# Patient Record
Sex: Male | Born: 1945 | Race: White | Hispanic: No | Smoking: Former smoker
Health system: Southern US, Community
[De-identification: ages and names within clinical notes are randomized; demographics above are authoritative.]

## PROBLEM LIST (undated history)

## (undated) DIAGNOSIS — I509 Heart failure, unspecified: Secondary | ICD-10-CM

## (undated) DIAGNOSIS — I2510 Atherosclerotic heart disease of native coronary artery without angina pectoris: Secondary | ICD-10-CM

## (undated) DIAGNOSIS — K922 Gastrointestinal hemorrhage, unspecified: Secondary | ICD-10-CM

## (undated) DIAGNOSIS — I219 Acute myocardial infarction, unspecified: Secondary | ICD-10-CM

## (undated) DIAGNOSIS — E7800 Pure hypercholesterolemia, unspecified: Secondary | ICD-10-CM

## (undated) DIAGNOSIS — I10 Essential (primary) hypertension: Secondary | ICD-10-CM

## (undated) DIAGNOSIS — Z720 Tobacco use: Secondary | ICD-10-CM

## (undated) DIAGNOSIS — G8929 Other chronic pain: Secondary | ICD-10-CM

## (undated) DIAGNOSIS — I350 Nonrheumatic aortic (valve) stenosis: Secondary | ICD-10-CM

## (undated) DIAGNOSIS — I255 Ischemic cardiomyopathy: Secondary | ICD-10-CM

## (undated) DIAGNOSIS — Z953 Presence of xenogenic heart valve: Secondary | ICD-10-CM

## (undated) DIAGNOSIS — R0989 Other specified symptoms and signs involving the circulatory and respiratory systems: Secondary | ICD-10-CM

## (undated) DIAGNOSIS — Z951 Presence of aortocoronary bypass graft: Secondary | ICD-10-CM

## (undated) DIAGNOSIS — J449 Chronic obstructive pulmonary disease, unspecified: Secondary | ICD-10-CM

## (undated) DIAGNOSIS — M545 Low back pain, unspecified: Secondary | ICD-10-CM

## (undated) DIAGNOSIS — E119 Type 2 diabetes mellitus without complications: Secondary | ICD-10-CM

## (undated) HISTORY — DX: Gastrointestinal hemorrhage, unspecified: K92.2

## (undated) HISTORY — PX: CORONARY ANGIOPLASTY WITH STENT PLACEMENT: SHX49

## (undated) HISTORY — DX: Heart failure, unspecified: I50.9

## (undated) HISTORY — PX: COLONOSCOPY: SHX174

## (undated) HISTORY — PX: TONSILLECTOMY: SUR1361

---

## 2016-01-25 DIAGNOSIS — M5416 Radiculopathy, lumbar region: Secondary | ICD-10-CM | POA: Diagnosis not present

## 2016-01-31 DIAGNOSIS — R002 Palpitations: Secondary | ICD-10-CM | POA: Diagnosis not present

## 2016-01-31 DIAGNOSIS — I25118 Atherosclerotic heart disease of native coronary artery with other forms of angina pectoris: Secondary | ICD-10-CM | POA: Diagnosis not present

## 2016-01-31 DIAGNOSIS — I10 Essential (primary) hypertension: Secondary | ICD-10-CM | POA: Diagnosis not present

## 2016-01-31 DIAGNOSIS — J449 Chronic obstructive pulmonary disease, unspecified: Secondary | ICD-10-CM | POA: Diagnosis not present

## 2016-01-31 DIAGNOSIS — E118 Type 2 diabetes mellitus with unspecified complications: Secondary | ICD-10-CM | POA: Diagnosis not present

## 2016-04-25 DIAGNOSIS — M545 Low back pain: Secondary | ICD-10-CM | POA: Diagnosis not present

## 2016-04-25 DIAGNOSIS — G8929 Other chronic pain: Secondary | ICD-10-CM | POA: Diagnosis not present

## 2016-05-07 DIAGNOSIS — R911 Solitary pulmonary nodule: Secondary | ICD-10-CM | POA: Diagnosis not present

## 2016-07-23 DIAGNOSIS — M4697 Unspecified inflammatory spondylopathy, lumbosacral region: Secondary | ICD-10-CM | POA: Diagnosis not present

## 2016-07-23 DIAGNOSIS — M47817 Spondylosis without myelopathy or radiculopathy, lumbosacral region: Secondary | ICD-10-CM | POA: Diagnosis not present

## 2016-07-30 DIAGNOSIS — M545 Low back pain: Secondary | ICD-10-CM | POA: Diagnosis not present

## 2016-07-30 DIAGNOSIS — K219 Gastro-esophageal reflux disease without esophagitis: Secondary | ICD-10-CM | POA: Diagnosis not present

## 2016-07-30 DIAGNOSIS — E118 Type 2 diabetes mellitus with unspecified complications: Secondary | ICD-10-CM | POA: Diagnosis not present

## 2016-07-30 DIAGNOSIS — Z9289 Personal history of other medical treatment: Secondary | ICD-10-CM | POA: Diagnosis not present

## 2016-07-30 DIAGNOSIS — J449 Chronic obstructive pulmonary disease, unspecified: Secondary | ICD-10-CM | POA: Diagnosis not present

## 2016-07-30 DIAGNOSIS — I25118 Atherosclerotic heart disease of native coronary artery with other forms of angina pectoris: Secondary | ICD-10-CM | POA: Diagnosis not present

## 2016-07-30 DIAGNOSIS — I10 Essential (primary) hypertension: Secondary | ICD-10-CM | POA: Diagnosis not present

## 2016-07-30 DIAGNOSIS — R002 Palpitations: Secondary | ICD-10-CM | POA: Diagnosis not present

## 2016-07-30 DIAGNOSIS — E1165 Type 2 diabetes mellitus with hyperglycemia: Secondary | ICD-10-CM | POA: Diagnosis not present

## 2016-09-18 DIAGNOSIS — M545 Low back pain: Secondary | ICD-10-CM | POA: Diagnosis not present

## 2016-11-20 DIAGNOSIS — M545 Low back pain: Secondary | ICD-10-CM | POA: Diagnosis not present

## 2016-11-20 DIAGNOSIS — G8929 Other chronic pain: Secondary | ICD-10-CM | POA: Diagnosis not present

## 2016-11-20 DIAGNOSIS — M542 Cervicalgia: Secondary | ICD-10-CM | POA: Diagnosis not present

## 2016-12-06 DIAGNOSIS — J449 Chronic obstructive pulmonary disease, unspecified: Secondary | ICD-10-CM | POA: Diagnosis not present

## 2016-12-06 DIAGNOSIS — K219 Gastro-esophageal reflux disease without esophagitis: Secondary | ICD-10-CM | POA: Diagnosis not present

## 2016-12-06 DIAGNOSIS — E118 Type 2 diabetes mellitus with unspecified complications: Secondary | ICD-10-CM | POA: Diagnosis not present

## 2016-12-06 DIAGNOSIS — I10 Essential (primary) hypertension: Secondary | ICD-10-CM | POA: Diagnosis not present

## 2016-12-06 DIAGNOSIS — I25118 Atherosclerotic heart disease of native coronary artery with other forms of angina pectoris: Secondary | ICD-10-CM | POA: Diagnosis not present

## 2016-12-06 DIAGNOSIS — Z9289 Personal history of other medical treatment: Secondary | ICD-10-CM | POA: Diagnosis not present

## 2016-12-06 DIAGNOSIS — M545 Low back pain: Secondary | ICD-10-CM | POA: Diagnosis not present

## 2016-12-06 DIAGNOSIS — E1165 Type 2 diabetes mellitus with hyperglycemia: Secondary | ICD-10-CM | POA: Diagnosis not present

## 2016-12-06 DIAGNOSIS — R002 Palpitations: Secondary | ICD-10-CM | POA: Diagnosis not present

## 2017-01-16 DIAGNOSIS — M542 Cervicalgia: Secondary | ICD-10-CM | POA: Diagnosis not present

## 2017-01-16 DIAGNOSIS — G8929 Other chronic pain: Secondary | ICD-10-CM | POA: Diagnosis not present

## 2017-01-16 DIAGNOSIS — M545 Low back pain: Secondary | ICD-10-CM | POA: Diagnosis not present

## 2017-04-09 DIAGNOSIS — M545 Low back pain: Secondary | ICD-10-CM | POA: Diagnosis not present

## 2017-04-09 DIAGNOSIS — E118 Type 2 diabetes mellitus with unspecified complications: Secondary | ICD-10-CM | POA: Diagnosis not present

## 2017-04-09 DIAGNOSIS — Z9289 Personal history of other medical treatment: Secondary | ICD-10-CM | POA: Diagnosis not present

## 2017-04-09 DIAGNOSIS — I25118 Atherosclerotic heart disease of native coronary artery with other forms of angina pectoris: Secondary | ICD-10-CM | POA: Diagnosis not present

## 2017-04-09 DIAGNOSIS — I10 Essential (primary) hypertension: Secondary | ICD-10-CM | POA: Diagnosis not present

## 2017-04-09 DIAGNOSIS — J449 Chronic obstructive pulmonary disease, unspecified: Secondary | ICD-10-CM | POA: Diagnosis not present

## 2017-04-09 DIAGNOSIS — K219 Gastro-esophageal reflux disease without esophagitis: Secondary | ICD-10-CM | POA: Diagnosis not present

## 2017-04-09 DIAGNOSIS — E1165 Type 2 diabetes mellitus with hyperglycemia: Secondary | ICD-10-CM | POA: Diagnosis not present

## 2017-04-09 DIAGNOSIS — R002 Palpitations: Secondary | ICD-10-CM | POA: Diagnosis not present

## 2017-04-19 DIAGNOSIS — G8929 Other chronic pain: Secondary | ICD-10-CM | POA: Diagnosis not present

## 2017-04-19 DIAGNOSIS — M545 Low back pain: Secondary | ICD-10-CM | POA: Diagnosis not present

## 2017-04-19 DIAGNOSIS — M542 Cervicalgia: Secondary | ICD-10-CM | POA: Diagnosis not present

## 2017-06-09 DIAGNOSIS — J441 Chronic obstructive pulmonary disease with (acute) exacerbation: Secondary | ICD-10-CM | POA: Diagnosis not present

## 2017-07-05 DIAGNOSIS — M5137 Other intervertebral disc degeneration, lumbosacral region: Secondary | ICD-10-CM | POA: Diagnosis not present

## 2017-08-12 DIAGNOSIS — M545 Low back pain: Secondary | ICD-10-CM | POA: Diagnosis not present

## 2017-08-12 DIAGNOSIS — Z9289 Personal history of other medical treatment: Secondary | ICD-10-CM | POA: Diagnosis not present

## 2017-08-12 DIAGNOSIS — I10 Essential (primary) hypertension: Secondary | ICD-10-CM | POA: Diagnosis not present

## 2017-08-12 DIAGNOSIS — E118 Type 2 diabetes mellitus with unspecified complications: Secondary | ICD-10-CM | POA: Diagnosis not present

## 2017-08-26 DIAGNOSIS — M5416 Radiculopathy, lumbar region: Secondary | ICD-10-CM | POA: Diagnosis not present

## 2017-10-21 DIAGNOSIS — G8929 Other chronic pain: Secondary | ICD-10-CM | POA: Diagnosis not present

## 2017-12-02 DIAGNOSIS — Z9289 Personal history of other medical treatment: Secondary | ICD-10-CM | POA: Diagnosis not present

## 2017-12-02 DIAGNOSIS — E118 Type 2 diabetes mellitus with unspecified complications: Secondary | ICD-10-CM | POA: Diagnosis not present

## 2017-12-02 DIAGNOSIS — I25118 Atherosclerotic heart disease of native coronary artery with other forms of angina pectoris: Secondary | ICD-10-CM | POA: Diagnosis not present

## 2017-12-02 DIAGNOSIS — M545 Low back pain: Secondary | ICD-10-CM | POA: Diagnosis not present

## 2017-12-02 DIAGNOSIS — Z23 Encounter for immunization: Secondary | ICD-10-CM | POA: Diagnosis not present

## 2017-12-02 DIAGNOSIS — R002 Palpitations: Secondary | ICD-10-CM | POA: Diagnosis not present

## 2017-12-02 DIAGNOSIS — E1165 Type 2 diabetes mellitus with hyperglycemia: Secondary | ICD-10-CM | POA: Diagnosis not present

## 2017-12-02 DIAGNOSIS — K219 Gastro-esophageal reflux disease without esophagitis: Secondary | ICD-10-CM | POA: Diagnosis not present

## 2017-12-02 DIAGNOSIS — J449 Chronic obstructive pulmonary disease, unspecified: Secondary | ICD-10-CM | POA: Diagnosis not present

## 2017-12-02 DIAGNOSIS — I10 Essential (primary) hypertension: Secondary | ICD-10-CM | POA: Diagnosis not present

## 2017-12-30 DIAGNOSIS — G8929 Other chronic pain: Secondary | ICD-10-CM | POA: Diagnosis not present

## 2018-02-01 DIAGNOSIS — R062 Wheezing: Secondary | ICD-10-CM | POA: Diagnosis not present

## 2018-02-01 DIAGNOSIS — J441 Chronic obstructive pulmonary disease with (acute) exacerbation: Secondary | ICD-10-CM | POA: Diagnosis not present

## 2018-02-03 ENCOUNTER — Emergency Department
Admission: EM | Admit: 2018-02-03 | Discharge: 2018-02-03 | Disposition: A | Discharge status: Home / Self Care | Payer: Medicare Other | Attending: Emergency Medicine | Admitting: Emergency Medicine

## 2018-02-03 ENCOUNTER — Encounter: Payer: Self-pay | Admitting: Emergency Medicine

## 2018-02-03 ENCOUNTER — Emergency Department: Payer: Medicare Other

## 2018-02-03 ENCOUNTER — Other Ambulatory Visit: Payer: Self-pay

## 2018-02-03 DIAGNOSIS — J441 Chronic obstructive pulmonary disease with (acute) exacerbation: Secondary | ICD-10-CM | POA: Insufficient documentation

## 2018-02-03 DIAGNOSIS — J101 Influenza due to other identified influenza virus with other respiratory manifestations: Secondary | ICD-10-CM | POA: Insufficient documentation

## 2018-02-03 DIAGNOSIS — R0602 Shortness of breath: Secondary | ICD-10-CM | POA: Diagnosis not present

## 2018-02-03 DIAGNOSIS — I2510 Atherosclerotic heart disease of native coronary artery without angina pectoris: Secondary | ICD-10-CM | POA: Insufficient documentation

## 2018-02-03 DIAGNOSIS — F17200 Nicotine dependence, unspecified, uncomplicated: Secondary | ICD-10-CM | POA: Insufficient documentation

## 2018-02-03 LAB — INFLUENZA PANEL BY PCR (TYPE A & B)
Influenza A By PCR: POSITIVE — AB
Influenza B By PCR: NEGATIVE

## 2018-02-03 LAB — COMPREHENSIVE METABOLIC PANEL
ALT: 25 U/L (ref 17–63)
AST: 54 U/L — ABNORMAL HIGH (ref 15–41)
Albumin: 3.9 g/dL (ref 3.5–5.0)
Alkaline Phosphatase: 45 U/L (ref 38–126)
Anion gap: 10 (ref 5–15)
BUN: 22 mg/dL — ABNORMAL HIGH (ref 6–20)
CO2: 22 mmol/L (ref 22–32)
Calcium: 8.7 mg/dL — ABNORMAL LOW (ref 8.9–10.3)
Chloride: 105 mmol/L (ref 101–111)
Creatinine, Ser: 0.84 mg/dL (ref 0.61–1.24)
GFR calc Af Amer: >60 mL/min (ref >60)
GFR calc non Af Amer: >60 mL/min (ref >60)
Glucose, Bld: 167 mg/dL — ABNORMAL HIGH (ref 65–99)
Potassium: 4.3 mmol/L (ref 3.5–5.1)
Sodium: 137 mmol/L (ref 135–145)
Total Bilirubin: 0.9 mg/dL (ref 0.3–1.2)
Total Protein: 7.1 g/dL (ref 6.5–8.1)

## 2018-02-03 LAB — CBC WITH DIFFERENTIAL/PLATELET
Basophils Absolute: 0 10*3/uL (ref 0–0.1)
Basophils Relative: 0 %
Eosinophils Absolute: 0 10*3/uL (ref 0–0.7)
Eosinophils Relative: 0 %
HCT: 44.4 % (ref 40.0–52.0)
Hemoglobin: 15.2 g/dL (ref 13.0–18.0)
Lymphocytes Relative: 9 %
Lymphs Abs: 0.7 10*3/uL — ABNORMAL LOW (ref 1.0–3.6)
MCH: 30.8 pg (ref 26.0–34.0)
MCHC: 34.2 g/dL (ref 32.0–36.0)
MCV: 90.1 fL (ref 80.0–100.0)
Monocytes Absolute: 0.5 10*3/uL (ref 0.2–1.0)
Monocytes Relative: 6 %
Neutro Abs: 6.7 10*3/uL — ABNORMAL HIGH (ref 1.4–6.5)
Neutrophils Relative %: 85 %
Platelets: 200 10*3/uL (ref 150–440)
RBC: 4.93 MIL/uL (ref 4.40–5.90)
RDW: 14.3 % (ref 11.5–14.5)
WBC: 7.9 10*3/uL (ref 3.8–10.6)

## 2018-02-03 MED ORDER — IPRATROPIUM-ALBUTEROL 0.5-2.5 (3) MG/3ML IN SOLN
3.0000 mL | Freq: Once | RESPIRATORY_TRACT | Status: AC
  Administered 2018-02-03: 3 mL via RESPIRATORY_TRACT

## 2018-02-03 MED ORDER — ALBUTEROL SULFATE (2.5 MG/3ML) 0.083% IN NEBU
5.0000 mg | INHALATION_SOLUTION | Freq: Once | RESPIRATORY_TRACT | Status: AC
  Administered 2018-02-03: 5 mg via RESPIRATORY_TRACT
  Filled 2018-02-03: qty 6

## 2018-02-03 MED ORDER — HYDROCOD POLST-CPM POLST ER 10-8 MG/5ML PO SUER: 5.0000 mL | Freq: Two times a day (BID) | ORAL | 0 refills | Status: DC

## 2018-02-03 MED ORDER — METHYLPREDNISOLONE SODIUM SUCC 125 MG IJ SOLR
125.0000 mg | Freq: Once | INTRAMUSCULAR | Status: AC
  Administered 2018-02-03: 125 mg via INTRAMUSCULAR
  Filled 2018-02-03: qty 2

## 2018-02-03 MED ORDER — IPRATROPIUM-ALBUTEROL 0.5-2.5 (3) MG/3ML IN SOLN: 3.0000 mL | Freq: Four times a day (QID) | RESPIRATORY_TRACT | 1 refills | Status: DC | PRN

## 2018-02-03 MED ORDER — OSELTAMIVIR PHOSPHATE 75 MG PO CAPS
75.0000 mg | ORAL_CAPSULE | Freq: Once | ORAL | Status: AC
  Administered 2018-02-03: 75 mg via ORAL
  Filled 2018-02-03: qty 1

## 2018-02-03 MED ORDER — OSELTAMIVIR PHOSPHATE 75 MG PO CAPS: 75.0000 mg | ORAL_CAPSULE | Freq: Two times a day (BID) | ORAL | 0 refills | Status: DC

## 2018-02-03 MED ORDER — IPRATROPIUM-ALBUTEROL 0.5-2.5 (3) MG/3ML IN SOLN
3.0000 mL | Freq: Once | RESPIRATORY_TRACT | Status: AC
  Administered 2018-02-03: 3 mL via RESPIRATORY_TRACT
  Filled 2018-02-03: qty 9

## 2018-02-03 MED ORDER — OSELTAMIVIR PHOSPHATE 75 MG PO CAPS: 75.0000 mg | ORAL_CAPSULE | Freq: Every morning | ORAL | 0 refills | Status: DC

## 2018-02-03 NOTE — ED Provider Notes (Signed)
[facility] Emergency Department Provider Note       Time seen: ----------------------------------------- 11:54 AM on 02/03/2018 -----------------------------------------   I have reviewed the triage vital signs and the nursing notes.  HISTORY   Chief Complaint Shortness of Breath and Chest Pain    HPI Harold Parker is a 72 y.o. male with a history of COPD, coronary artery disease who presents to the ED for [place] of breath and chest pain.  Patient was recently seen in urgent care and given prednisone as well as antibiotics without any significant improvement in his symptoms.  Patient feels like he is having a COPD exacerbation.  Patient reports poor sleeping over the past 2 days because of shortness of breath.  He does report he still smokes daily. Pain is 4 out of 10 in his chest  History reviewed. No pertinent past medical history.  There are no active problems to display for this patient.   History reviewed. No pertinent surgical history.  Allergies Patient has no known allergies.  Social History Social History   Tobacco Use  . Smoking status: Current Every Day Smoker  . Smokeless tobacco: Never Used  Substance Use Topics  . Alcohol use: No    Frequency: Never  . Drug use: No    Review of Systems Constitutional: Negative for fever. Cardiovascular: Positive for chest pain Respiratory: Positive for shortness of breath and cough Gastrointestinal: Negative for abdominal pain, vomiting and diarrhea. Genitourinary: Negative for dysuria. Musculoskeletal: Negative for back pain. Skin: Negative for rash. Neurological: Negative for headaches, focal weakness or numbness.  All systems negative/normal/unremarkable except as stated in the HPI  ____________________________________________   PHYSICAL EXAM:  VITAL SIGNS: ED Triage Vitals [02/03/18 0952]  Enc Vitals Group     BP 135/68     Pulse Rate 83     Resp 20     Temp 98.2 F (36.8 C)    Temp Source Oral     SpO2 92 %     Weight 172 lb (78 kg)     Height      Head Circumference      Peak Flow      Pain Score 4     Pain Loc      Pain Edu?      Excl. in GC?     Constitutional: Alert and oriented. Well appearing and in no distress. Eyes: Conjunctivae are normal. Normal extraocular movements. ENT   Head: Normocephalic and atraumatic.   Nose: No congestion/rhinnorhea.   Mouth/Throat: Mucous membranes are moist.   Neck: No stridor. Cardiovascular: Normal rate, regular rhythm. No murmurs, rubs, or gallops. Respiratory: Normal respiratory effort without tachypnea nor retractions. Breath sounds are clear and equal bilaterally. No wheezes/rales/rhonchi. Gastrointestinal: Soft and nontender. Normal bowel sounds Musculoskeletal: Nontender with normal range of motion in extremities. No lower extremity tenderness nor edema. Neurologic:  Normal speech and language. No gross focal neurologic deficits are appreciated.  Skin:  Skin is warm, dry and intact. No rash noted. Psychiatric: Mood and affect are normal. Speech and behavior are normal.  ____________________________________________  EKG: Interpreted by me.  Sinus rhythm rate 84 bpm, normal PR interval, normal QRS, normal QT.  Possible inferior infarct age-indeterminate ____________________________________________  ED COURSE:  As part of my medical decision making, I reviewed the following data within the electronic MEDICAL RECORD NUMBER History obtained from family if available, nursing notes, old chart and ekg, as well as notes from prior ED visits. Patient presented for chest pain or  shortness of breath, we will assess with labs and imaging as indicated at this time.   Procedures ____________________________________________   LABS (pertinent positives/negatives)  Labs Reviewed  CBC WITH DIFFERENTIAL/PLATELET - Abnormal; Notable for the following components:      Result Value   Neutro Abs 6.7 (*)    Lymphs  Abs 0.7 (*)    All other components within normal limits  COMPREHENSIVE METABOLIC PANEL - Abnormal; Notable for the following components:   Glucose, Bld 167 (*)    BUN 22 (*)    Calcium 8.7 (*)    AST 54 (*)    All other components within normal limits  INFLUENZA PANEL BY PCR (TYPE A & B) - Abnormal; Notable for the following components:   Influenza A By PCR POSITIVE (*)    All other components within normal limits    RADIOLOGY  Chest x-ray IMPRESSION: 1. Pulmonary hyperinflation. Mild diffuse pulmonary interstitial opacity, probably chronic and smoking related, but consider also viral/atypical respiratory infection. 2. No pleural effusion or other acute cardiopulmonary abnormality. ____________________________________________  DIFFERENTIAL DIAGNOSIS   COPD, pneumonia, influenza, URI, PE, pneumothorax  FINAL ASSESSMENT AND PLAN  COPD exacerbation, influenza A   Plan: Patient had presented for short of breath and chest pain. Patient's labs were positive for influenza A but otherwise unremarkable. Patient's imaging did not reveal any acute process.  We did give him duo nebs as well as steroids and Tamiflu.  He will be given Tamiflu to take at home as well as Tussionex and a prescription for DuoNeb via nebulizer.  He is stable for outpatient follow-up and currently feeling better.   Ulice Dash, MD   Note: This note was generated in part or whole with voice recognition software. Voice recognition is usually quite accurate but there are transcription errors that can and very often do occur. I apologize for any typographical errors that were not detected and corrected.     Emily Filbert, MD 02/03/18 [phone]

## 2018-02-03 NOTE — ED Triage Notes (Signed)
Pt with shortness of breath and chest pain. Recently seen at urgent care and given prednisone and antibiotics, breathing with no relief.

## 2018-02-05 ENCOUNTER — Inpatient Hospital Stay
Admission: EM | Admit: 2018-02-05 | Discharge: 2018-02-07 | DRG: 190 | Disposition: A | Discharge status: Other Acute Inpatient Hospital | Payer: Medicare Other | Attending: Internal Medicine | Admitting: Internal Medicine

## 2018-02-05 ENCOUNTER — Other Ambulatory Visit: Payer: Self-pay

## 2018-02-05 ENCOUNTER — Encounter: Payer: Self-pay | Admitting: Emergency Medicine

## 2018-02-05 ENCOUNTER — Emergency Department: Payer: Medicare Other

## 2018-02-05 DIAGNOSIS — I2510 Atherosclerotic heart disease of native coronary artery without angina pectoris: Secondary | ICD-10-CM | POA: Diagnosis not present

## 2018-02-05 DIAGNOSIS — J811 Chronic pulmonary edema: Secondary | ICD-10-CM | POA: Diagnosis not present

## 2018-02-05 DIAGNOSIS — Z955 Presence of coronary angioplasty implant and graft: Secondary | ICD-10-CM

## 2018-02-05 DIAGNOSIS — I255 Ischemic cardiomyopathy: Secondary | ICD-10-CM | POA: Diagnosis present

## 2018-02-05 DIAGNOSIS — I252 Old myocardial infarction: Secondary | ICD-10-CM

## 2018-02-05 DIAGNOSIS — I110 Hypertensive heart disease with heart failure: Secondary | ICD-10-CM | POA: Diagnosis present

## 2018-02-05 DIAGNOSIS — E7800 Pure hypercholesterolemia, unspecified: Secondary | ICD-10-CM | POA: Diagnosis present

## 2018-02-05 DIAGNOSIS — I350 Nonrheumatic aortic (valve) stenosis: Secondary | ICD-10-CM | POA: Diagnosis present

## 2018-02-05 DIAGNOSIS — R748 Abnormal levels of other serum enzymes: Secondary | ICD-10-CM | POA: Diagnosis not present

## 2018-02-05 DIAGNOSIS — I214 Non-ST elevation (NSTEMI) myocardial infarction: Secondary | ICD-10-CM | POA: Diagnosis not present

## 2018-02-05 DIAGNOSIS — F410 Panic disorder [episodic paroxysmal anxiety] without agoraphobia: Secondary | ICD-10-CM | POA: Diagnosis present

## 2018-02-05 DIAGNOSIS — E785 Hyperlipidemia, unspecified: Secondary | ICD-10-CM | POA: Diagnosis present

## 2018-02-05 DIAGNOSIS — E860 Dehydration: Secondary | ICD-10-CM | POA: Diagnosis present

## 2018-02-05 DIAGNOSIS — Z807 Family history of other malignant neoplasms of lymphoid, hematopoietic and related tissues: Secondary | ICD-10-CM | POA: Diagnosis not present

## 2018-02-05 DIAGNOSIS — J9601 Acute respiratory failure with hypoxia: Secondary | ICD-10-CM | POA: Diagnosis not present

## 2018-02-05 DIAGNOSIS — E119 Type 2 diabetes mellitus without complications: Secondary | ICD-10-CM | POA: Diagnosis present

## 2018-02-05 DIAGNOSIS — I160 Hypertensive urgency: Secondary | ICD-10-CM | POA: Diagnosis present

## 2018-02-05 DIAGNOSIS — J441 Chronic obstructive pulmonary disease with (acute) exacerbation: Secondary | ICD-10-CM | POA: Diagnosis not present

## 2018-02-05 DIAGNOSIS — R0602 Shortness of breath: Secondary | ICD-10-CM | POA: Diagnosis not present

## 2018-02-05 DIAGNOSIS — F17200 Nicotine dependence, unspecified, uncomplicated: Secondary | ICD-10-CM | POA: Diagnosis present

## 2018-02-05 DIAGNOSIS — Z8249 Family history of ischemic heart disease and other diseases of the circulatory system: Secondary | ICD-10-CM | POA: Diagnosis not present

## 2018-02-05 DIAGNOSIS — J101 Influenza due to other identified influenza virus with other respiratory manifestations: Secondary | ICD-10-CM | POA: Diagnosis not present

## 2018-02-05 DIAGNOSIS — R0989 Other specified symptoms and signs involving the circulatory and respiratory systems: Secondary | ICD-10-CM

## 2018-02-05 DIAGNOSIS — I5021 Acute systolic (congestive) heart failure: Secondary | ICD-10-CM | POA: Diagnosis present

## 2018-02-05 DIAGNOSIS — J810 Acute pulmonary edema: Secondary | ICD-10-CM | POA: Diagnosis not present

## 2018-02-05 DIAGNOSIS — I340 Nonrheumatic mitral (valve) insufficiency: Secondary | ICD-10-CM | POA: Diagnosis not present

## 2018-02-05 HISTORY — DX: Ischemic cardiomyopathy: I25.5

## 2018-02-05 HISTORY — DX: Essential (primary) hypertension: I10

## 2018-02-05 HISTORY — DX: Tobacco use: Z72.0

## 2018-02-05 HISTORY — DX: Other specified symptoms and signs involving the circulatory and respiratory systems: R09.89

## 2018-02-05 HISTORY — DX: Chronic obstructive pulmonary disease, unspecified: J44.9

## 2018-02-05 HISTORY — DX: Nonrheumatic aortic (valve) stenosis: I35.0

## 2018-02-05 HISTORY — DX: Pure hypercholesterolemia, unspecified: E78.00

## 2018-02-05 HISTORY — DX: Atherosclerotic heart disease of native coronary artery without angina pectoris: I25.10

## 2018-02-05 LAB — TROPONIN I
Troponin I: 0.36 ng/mL (ref <0.03)
Troponin I: 1.74 ng/mL (ref <0.03)

## 2018-02-05 LAB — BASIC METABOLIC PANEL
Anion gap: 12 (ref 5–15)
BUN: 34 mg/dL — ABNORMAL HIGH (ref 6–20)
CO2: 22 mmol/L (ref 22–32)
Calcium: 8.3 mg/dL — ABNORMAL LOW (ref 8.9–10.3)
Chloride: 101 mmol/L (ref 101–111)
Creatinine, Ser: 0.77 mg/dL (ref 0.61–1.24)
GFR calc Af Amer: >60 mL/min (ref >60)
GFR calc non Af Amer: >60 mL/min (ref >60)
Glucose, Bld: 342 mg/dL — ABNORMAL HIGH (ref 65–99)
Potassium: 4.5 mmol/L (ref 3.5–5.1)
Sodium: 135 mmol/L (ref 135–145)

## 2018-02-05 LAB — CBC
HCT: 45.5 % (ref 40.0–52.0)
Hemoglobin: 15.4 g/dL (ref 13.0–18.0)
MCH: 30.6 pg (ref 26.0–34.0)
MCHC: 33.9 g/dL (ref 32.0–36.0)
MCV: 90.4 fL (ref 80.0–100.0)
Platelets: 205 10*3/uL (ref 150–440)
RBC: 5.03 MIL/uL (ref 4.40–5.90)
RDW: 14.6 % — ABNORMAL HIGH (ref 11.5–14.5)
WBC: 7.7 10*3/uL (ref 3.8–10.6)

## 2018-02-05 LAB — MAGNESIUM: Magnesium: 2 mg/dL (ref 1.7–2.4)

## 2018-02-05 MED ORDER — ATENOLOL 25 MG PO TABS
50.0000 mg | ORAL_TABLET | Freq: Once | ORAL | Status: AC
  Administered 2018-02-05: 50 mg via ORAL
  Filled 2018-02-05: qty 2

## 2018-02-05 MED ORDER — ONDANSETRON HCL 4 MG/2ML IJ SOLN: 4.0000 mg | Freq: Four times a day (QID) | INTRAMUSCULAR | Status: DC | PRN

## 2018-02-05 MED ORDER — ASPIRIN EC 325 MG PO TBEC
325.0000 mg | DELAYED_RELEASE_TABLET | Freq: Every day | ORAL | Status: DC
  Administered 2018-02-06 – 2018-02-07 (×2): 325 mg via ORAL
  Filled 2018-02-05 (×2): qty 1

## 2018-02-05 MED ORDER — NICOTINE 14 MG/24HR TD PT24
14.0000 mg | MEDICATED_PATCH | Freq: Every day | TRANSDERMAL | Status: DC
  Administered 2018-02-05 – 2018-02-07 (×3): 14 mg via TRANSDERMAL
  Filled 2018-02-05 (×3): qty 1

## 2018-02-05 MED ORDER — ISOSORBIDE MONONITRATE ER 60 MG PO TB24
60.0000 mg | ORAL_TABLET | Freq: Once | ORAL | Status: AC
  Administered 2018-02-05: 60 mg via ORAL
  Filled 2018-02-05: qty 1

## 2018-02-05 MED ORDER — HYDRALAZINE HCL 20 MG/ML IJ SOLN: 10.0000 mg | Freq: Four times a day (QID) | INTRAMUSCULAR | Status: DC | PRN

## 2018-02-05 MED ORDER — HYDROCODONE-ACETAMINOPHEN 5-325 MG PO TABS: 1.0000 | ORAL_TABLET | ORAL | Status: DC | PRN

## 2018-02-05 MED ORDER — BISACODYL 5 MG PO TBEC
5.0000 mg | DELAYED_RELEASE_TABLET | Freq: Every day | ORAL | Status: DC | PRN
Start: 2018-02-05 — End: 2018-02-07

## 2018-02-05 MED ORDER — ONDANSETRON HCL 4 MG PO TABS: 4.0000 mg | ORAL_TABLET | Freq: Four times a day (QID) | ORAL | Status: DC | PRN

## 2018-02-05 MED ORDER — ATORVASTATIN CALCIUM 20 MG PO TABS
40.0000 mg | ORAL_TABLET | Freq: Every day | ORAL | Status: DC
  Administered 2018-02-06: 40 mg via ORAL
  Filled 2018-02-05: qty 2

## 2018-02-05 MED ORDER — ALBUTEROL SULFATE (2.5 MG/3ML) 0.083% IN NEBU
2.5000 mg | INHALATION_SOLUTION | Freq: Four times a day (QID) | RESPIRATORY_TRACT | Status: DC
  Administered 2018-02-06 – 2018-02-07 (×6): 2.5 mg via RESPIRATORY_TRACT
  Filled 2018-02-05 (×6): qty 3

## 2018-02-05 MED ORDER — ALPRAZOLAM 0.5 MG PO TABS
0.5000 mg | ORAL_TABLET | Freq: Every evening | ORAL | Status: DC | PRN
  Administered 2018-02-05 – 2018-02-06 (×2): 0.5 mg via ORAL
  Filled 2018-02-05 (×2): qty 1

## 2018-02-05 MED ORDER — ACETAMINOPHEN 325 MG PO TABS
650.0000 mg | ORAL_TABLET | Freq: Four times a day (QID) | ORAL | Status: DC | PRN
  Administered 2018-02-05: 650 mg via ORAL
  Filled 2018-02-05: qty 2

## 2018-02-05 MED ORDER — GUAIFENESIN-DM 100-10 MG/5ML PO SYRP: 5.0000 mL | ORAL_SOLUTION | ORAL | Status: DC | PRN

## 2018-02-05 MED ORDER — OSELTAMIVIR PHOSPHATE 75 MG PO CAPS
75.0000 mg | ORAL_CAPSULE | Freq: Two times a day (BID) | ORAL | Status: DC
  Administered 2018-02-05 – 2018-02-07 (×4): 75 mg via ORAL
  Filled 2018-02-05 (×6): qty 1

## 2018-02-05 MED ORDER — METHYLPREDNISOLONE SODIUM SUCC 125 MG IJ SOLR
125.0000 mg | Freq: Once | INTRAMUSCULAR | Status: AC
  Administered 2018-02-05: 125 mg via INTRAVENOUS
  Filled 2018-02-05: qty 2

## 2018-02-05 MED ORDER — SODIUM CHLORIDE 0.9% FLUSH
3.0000 mL | Freq: Two times a day (BID) | INTRAVENOUS | Status: DC
  Administered 2018-02-05 – 2018-02-07 (×3): 3 mL via INTRAVENOUS

## 2018-02-05 MED ORDER — ALBUTEROL SULFATE (2.5 MG/3ML) 0.083% IN NEBU: 2.5000 mg | INHALATION_SOLUTION | RESPIRATORY_TRACT | Status: DC | PRN

## 2018-02-05 MED ORDER — METOPROLOL TARTRATE 25 MG PO TABS
25.0000 mg | ORAL_TABLET | Freq: Two times a day (BID) | ORAL | Status: DC
  Administered 2018-02-05 – 2018-02-07 (×4): 25 mg via ORAL
  Filled 2018-02-05 (×4): qty 1

## 2018-02-05 MED ORDER — ACETAMINOPHEN 650 MG RE SUPP: 650.0000 mg | Freq: Four times a day (QID) | RECTAL | Status: DC | PRN

## 2018-02-05 MED ORDER — ASPIRIN 81 MG PO CHEW
324.0000 mg | CHEWABLE_TABLET | Freq: Once | ORAL | Status: AC
Start: 2018-02-05 — End: 2018-02-05
  Administered 2018-02-05: 324 mg via ORAL

## 2018-02-05 MED ORDER — METHYLPREDNISOLONE SODIUM SUCC 125 MG IJ SOLR
60.0000 mg | Freq: Two times a day (BID) | INTRAMUSCULAR | Status: DC
  Administered 2018-02-06 – 2018-02-07 (×3): 60 mg via INTRAVENOUS
  Filled 2018-02-05 (×3): qty 2

## 2018-02-05 MED ORDER — AMLODIPINE BESYLATE 5 MG PO TABS
2.5000 mg | ORAL_TABLET | Freq: Once | ORAL | Status: AC
  Administered 2018-02-05: 2.5 mg via ORAL
  Filled 2018-02-05: qty 1

## 2018-02-05 MED ORDER — FUROSEMIDE 10 MG/ML IJ SOLN
20.0000 mg | Freq: Once | INTRAMUSCULAR | Status: AC
  Administered 2018-02-05: 20 mg via INTRAVENOUS
  Filled 2018-02-05: qty 4

## 2018-02-05 MED ORDER — IPRATROPIUM-ALBUTEROL 0.5-2.5 (3) MG/3ML IN SOLN
3.0000 mL | Freq: Once | RESPIRATORY_TRACT | Status: AC
  Administered 2018-02-05: 3 mL via RESPIRATORY_TRACT

## 2018-02-05 MED ORDER — ASPIRIN 81 MG PO CHEW
CHEWABLE_TABLET | ORAL | Status: AC
  Filled 2018-02-05: qty 4

## 2018-02-05 MED ORDER — SODIUM CHLORIDE 0.9 % IV SOLN
INTRAVENOUS | Status: DC
  Administered 2018-02-05: 22:00:00 via INTRAVENOUS

## 2018-02-05 MED ORDER — SENNOSIDES-DOCUSATE SODIUM 8.6-50 MG PO TABS: 1.0000 | ORAL_TABLET | Freq: Every evening | ORAL | Status: DC | PRN

## 2018-02-05 MED ORDER — SODIUM CHLORIDE 0.9% FLUSH: 3.0000 mL | INTRAVENOUS | Status: DC | PRN

## 2018-02-05 MED ORDER — SODIUM CHLORIDE 0.9 % IV SOLN: 250.0000 mL | INTRAVENOUS | Status: DC | PRN

## 2018-02-05 MED ORDER — IPRATROPIUM-ALBUTEROL 0.5-2.5 (3) MG/3ML IN SOLN
3.0000 mL | Freq: Once | RESPIRATORY_TRACT | Status: AC
  Administered 2018-02-05: 3 mL via RESPIRATORY_TRACT
  Filled 2018-02-05: qty 12

## 2018-02-05 MED ORDER — ENOXAPARIN SODIUM 40 MG/0.4ML ~~LOC~~ SOLN
40.0000 mg | SUBCUTANEOUS | Status: DC
  Administered 2018-02-05: 40 mg via SUBCUTANEOUS
  Filled 2018-02-05: qty 0.4

## 2018-02-05 NOTE — ED Notes (Signed)
Jen RN, aware of bed assigned  

## 2018-02-05 NOTE — ED Notes (Signed)
Date and time results received: 02/05/18 1754   Test: troponin Critical Value: 0.36  Name of Provider Notified: Dr. Don Perking  Orders Received? Or Actions Taken?: Orders Received - See Orders for details

## 2018-02-05 NOTE — ED Provider Notes (Signed)
[facility] Emergency Department Provider Note  ____________________________________________  Time seen: Approximately 4:11 PM  I have reviewed the triage vital signs and the nursing notes.   HISTORY  Chief Complaint Panic Attack; Influenza; and Shortness of Breath   HPI Harold Parker is a 72 y.o. male with a history of COPD, diabetes, hypertension, hyperlipidemia who presents for evaluation of shortness of breath. Patientwas diagnosed with influenza A 2 days ago. He has been taking his Tamiflu. he has been using his inhalers and steroids at home. Today he reports that he started having worsening shortness of breath and was having a hard time finding his inhaler so he started panicking and called 911. Patient continues to have moderate shortness of breath that has been constant since earlier today and associated with chest tightness, wheezing, and cough. No fever, vomiting or diarrhea. Patient is a smoker. Patient is from [place] and is in the process of relocating to [place]. He reports he hasn't taken any of his medications this morning. no personal or family history of blood clots, no history of cancer, no leg pain or swelling, no recent immobilization.  Past Medical History:  Diagnosis Date  . COPD (chronic obstructive pulmonary disease) (HCC)   . Diabetes mellitus without complication (HCC)   . HTN (hypertension)   . Hypercholesteremia     There are no active problems to display for this patient.   Past Surgical History:  Procedure Laterality Date  . CAROTID STENT      Prior to Admission medications   Medication Sig Start Date End Date Taking? Authorizing Provider  chlorpheniramine-HYDROcodone (TUSSIONEX PENNKINETIC ER) 10-8 MG/5ML SUER Take 5 mLs by mouth 2 (two) times daily. 02/03/18   Emily Filbert, MD  ipratropium-albuterol (DUONEB) 0.5-2.5 (3) MG/3ML SOLN Take 3 mLs by nebulization every 6 (six) hours as needed. 02/03/18    Emily Filbert, MD  oseltamivir (TAMIFLU) 75 MG capsule Take 1 capsule (75 mg total) by mouth 2 (two) times daily for 5 days. 02/03/18 02/08/18  Emily Filbert, MD  oseltamivir (TAMIFLU) 75 MG capsule Take 1 capsule (75 mg total) by mouth every morning for 10 days. For the spouse for prevention 02/03/18 02/13/18  Emily Filbert, MD    Allergies Patient has no known allergies.  No family history on file.  Social History Social History   Tobacco Use  . Smoking status: Current Every Day Smoker  . Smokeless tobacco: Never Used  Substance Use Topics  . Alcohol use: No    Frequency: Never  . Drug use: No    Review of Systems  Constitutional: Negative for fever. Eyes: Negative for visual changes. ENT: Negative for sore throat. Neck: No neck pain  Cardiovascular: + chest tightness Respiratory: + shortness of breath, cough, wheezing Gastrointestinal: Negative for abdominal pain, vomiting or diarrhea. Genitourinary: Negative for dysuria. Musculoskeletal: Negative for back pain. Skin: Negative for rash. Neurological: Negative for headaches, weakness or numbness. Psych: No SI or HI  ____________________________________________   PHYSICAL EXAM:  VITAL SIGNS: ED Triage Vitals  Enc Vitals Group     BP 02/05/18 1541 (!) 180/84     Pulse Rate 02/05/18 1541 (!) 111     Resp 02/05/18 1541 (!) 24     Temp 02/05/18 1541 98.5 F (36.9 C)     Temp Source 02/05/18 1541 Oral     SpO2 02/05/18 1541 93 %     Weight 02/05/18 1542 172 lb (78 kg)  Height 02/05/18 1542 5\' 6"  (1.676 m)     Head Circumference --      Peak Flow --      Pain Score 02/05/18 1542 0     Pain Loc --      Pain Edu? --      Excl. in GC? --     Constitutional: Alert and oriented, moderate respiratory distress.  HEENT:      Head: Normocephalic and atraumatic.         Eyes: Conjunctivae are normal. Sclera is non-icteric.       Mouth/Throat: Mucous membranes are moist.       Neck: Supple with  no signs of meningismus. Cardiovascular: tachycardic with regular rhythm. No murmurs, gallops, or rubs. 2+ symmetrical distal pulses are present in all extremities. No JVD. Respiratory: increased work of breathing, tachypnea, hypoxic, bilateral coarse rhonchi and decreased air movement. Gastrointestinal: Soft, non tender, and non distended with positive bowel sounds. No rebound or guarding. Musculoskeletal: Nontender with normal range of motion in all extremities. No edema, cyanosis, or erythema of extremities. Neurologic: Normal speech and language. Face is symmetric. Moving all extremities. No gross focal neurologic deficits are appreciated. Skin: Skin is warm, dry and intact. No rash noted. Psychiatric: Mood and affect are normal. Speech and behavior are normal.  ____________________________________________   LABS (all labs ordered are listed, but only abnormal results are displayed)  Labs Reviewed  BASIC METABOLIC PANEL - Abnormal; Notable for the following components:      Result Value   Glucose, Bld 342 (*)    BUN 34 (*)    Calcium 8.3 (*)    All other components within normal limits  CBC - Abnormal; Notable for the following components:   RDW 14.6 (*)    All other components within normal limits  TROPONIN I - Abnormal; Notable for the following components:   Troponin I 0.36 (*)    All other components within normal limits   ____________________________________________  EKG  ED ECG REPORT I, Nita Sickle, the attending physician, personally viewed and interpreted this ECG.  Sinus tachycardia, rate of 109, normal QTC, right axis deviation, T-wave inversions in inferior leads, no ST elevations or depressions. TWI are new when compared to prior from 2 days ago ____________________________________________  RADIOLOGY  I have personally reviewed the images performed during this visit and I agree with the Radiologist's read.   Interpretation by Radiologist:  Dg Chest 2  View  Result Date: 02/05/2018 CLINICAL DATA:  Shortness of breath. EXAM: CHEST  2 VIEW COMPARISON:  Radiographs of February 03, 2018. FINDINGS: The heart size and mediastinal contours are within normal limits. Atherosclerosis of thoracic aorta is noted. Increased bibasilar interstitial densities are noted concerning for pulmonary edema. Minimal bilateral pleural effusions are noted. No pneumothorax is noted. The visualized skeletal structures are unremarkable. IMPRESSION: Increased bilateral pulmonary edema. Minimal bilateral pleural effusions. Aortic Atherosclerosis (ICD10-I70.0). Electronically Signed   By: Lupita Raider, M.D.   On: 02/05/2018 17:44     ____________________________________________   PROCEDURES  Procedure(s) performed: None Procedures Critical Care performed: yes  CRITICAL CARE Performed by: Nita Sickle  ?  Total critical care time: 40 min  Critical care time was exclusive of separately billable procedures and treating other patients.  Critical care was necessary to treat or prevent imminent or life-threatening deterioration.  Critical care was time spent personally by me on the following activities: development of treatment plan with patient and/or surrogate as well as nursing, discussions with  consultants, evaluation of patient's response to treatment, examination of patient, obtaining history from patient or surrogate, ordering and performing treatments and interventions, ordering and review of laboratory studies, ordering and review of radiographic studies, pulse oximetry and re-evaluation of patient's condition.  ____________________________________________   INITIAL IMPRESSION / ASSESSMENT AND PLAN / ED COURSE  72 y.o. male with a history of COPD, diabetes, hypertension, hyperlipidemia who presents for evaluation of shortness of breath and chest tightness in the setting of recent diagnosis of Influenza A. patient in moderate respiratory distress,  hypoxic, with coarse rhonchi and decreased air movement bilaterally. Will start patient on DuoNeb 3, Solu-Medrol. Chest x-ray is pending to rule out pneumonia. EKG showing new T-wave inversions on inferior leads but no evidence of ST elevation. Troponin is pending.    _________________________ 5:55 PM on 02/05/2018 -----------------------------------------  Troponin elevated at 0.36 with new TWI concerning for NSTEMI. Patient reports full resolution of chest tightness after duoneb treatment. CXR concerning for mild pulmonary edema. Patient's breathing has improved but continues to have new O2 requirement. Will give lasix. Patient needs ECHO since he has no h/o CHF. Will admit to Hospitalist.   As part of my medical decision making, I reviewed the following data within the electronic MEDICAL RECORD NUMBER Nursing notes reviewed and incorporated, Labs reviewed , EKG interpreted , Old EKG reviewed, Radiograph reviewed , Discussed with admitting physician , Notes from prior ED visits and [place] Controlled Substance Database    Pertinent labs & imaging results that were available during my care of the patient were reviewed by me and considered in my medical decision making (see chart for details).    ____________________________________________   FINAL CLINICAL IMPRESSION(S) / ED DIAGNOSES  Final diagnoses:  NSTEMI (non-ST elevated myocardial infarction) (HCC)  COPD exacerbation (HCC)  Acute respiratory failure with hypoxia (HCC)  Acute pulmonary edema (HCC)      NEW MEDICATIONS STARTED DURING THIS VISIT:  ED Discharge Orders    None       Note:  This document was prepared using Dragon voice recognition software and may include unintentional dictation errors.    Don Perking, [place], MD 02/05/18 1758

## 2018-02-05 NOTE — H&P (Addendum)
[facility] [place] [facility]   PATIENT NAME: Harold Parker    MR#:  [id number]  DATE OF BIRTH:  1946/05/06  DATE OF ADMISSION:  02/05/2018  PRIMARY CARE PHYSICIAN: System, Pcp Not In   REQUESTING/REFERRING PHYSICIAN: Nita Sickle, MD  CHIEF COMPLAINT:   Chief Complaint  Patient presents with  . Panic Attack  . Influenza  . Shortness of Breath   Worsening shortness of breath for 2 days. HISTORY OF PRESENT ILLNESS:  Harold Parker  is a 72 y.o. male with a known history of hypertension, diabetes, hyperlipidemia, CAD s/p stent and COPD.  The patient presents the ED with above chief complaints.  He was diagnosed with influenza A 2 days ago, has been taking Tamiflu for the past 2 days.  He also take prednisone but the symptoms has been worsening for the past 2 days.  He complains of chest tightness, wheezing, shortness of breath and cough.  He denies any fever or chills or muscle aching.  He is put on oxygen by nasal cannula due to hypoxia in the ED.  His troponin is elevated at 0.36.  Chest x-ray show increased bilateral pulmonary edema. Minimal bilateral pleural effusions.  He is treated with aspirin and Lasix in the ED.  PAST MEDICAL HISTORY:   Past Medical History:  Diagnosis Date  . CAD (coronary artery disease)   . COPD (chronic obstructive pulmonary disease) (HCC)   . Diabetes mellitus without complication (HCC)   . HTN (hypertension)   . Hypercholesteremia     PAST SURGICAL HISTORY:   Past Surgical History:  Procedure Laterality Date  . CAROTID STENT      SOCIAL HISTORY:   Social History   Tobacco Use  . Smoking status: Current Every Day Smoker  . Smokeless tobacco: Never Used  Substance Use Topics  . Alcohol use: No    Frequency: Never    FAMILY HISTORY:   Family History  Problem Relation Age of Onset  . Lymphoma Mother   . Peripheral vascular disease Father     DRUG ALLERGIES:  No Known Allergies  REVIEW OF SYSTEMS:    Review of Systems  Constitutional: Positive for malaise/fatigue. Negative for chills and fever.  HENT: Negative for sore throat.   Eyes: Negative for blurred vision and double vision.  Respiratory: Positive for cough, sputum production and shortness of breath. Negative for hemoptysis, wheezing and stridor.   Cardiovascular: Negative for chest pain, palpitations, orthopnea and leg swelling.  Gastrointestinal: Negative for abdominal pain, blood in stool, diarrhea, melena, nausea and vomiting.  Genitourinary: Negative for dysuria, flank pain and hematuria.  Musculoskeletal: Negative for back pain and joint pain.  Skin: Negative for rash.  Neurological: Negative for dizziness, sensory change, focal weakness, seizures, loss of consciousness, weakness and headaches.  Endo/Heme/Allergies: Negative for polydipsia.  Psychiatric/Behavioral: Negative for depression. The patient is nervous/anxious.     MEDICATIONS AT HOME:   Prior to Admission medications   Medication Sig Start Date End Date Taking? Authorizing Provider  chlorpheniramine-HYDROcodone (TUSSIONEX PENNKINETIC ER) 10-8 MG/5ML SUER Take 5 mLs by mouth 2 (two) times daily. 02/03/18   Emily Filbert, MD  ipratropium-albuterol (DUONEB) 0.5-2.5 (3) MG/3ML SOLN Take 3 mLs by nebulization every 6 (six) hours as needed. 02/03/18   Emily Filbert, MD  oseltamivir (TAMIFLU) 75 MG capsule Take 1 capsule (75 mg total) by mouth 2 (two) times daily for 5 days. 02/03/18 02/08/18  Emily Filbert, MD  oseltamivir (TAMIFLU) 75 MG capsule  Take 1 capsule (75 mg total) by mouth every morning for 10 days. For the spouse for prevention 02/03/18 02/13/18  Emily Filbert, MD      VITAL SIGNS:  Blood pressure (!) 152/71, pulse (!) 107, temperature 98.5 F (36.9 C), temperature source Oral, resp. rate (!) 30, height 5\' 6"  (1.676 m), weight 172 lb (78 kg), SpO2 94 %.  PHYSICAL EXAMINATION:  Physical Exam  GENERAL:  72 y.o.-year-old patient  lying in the bed with no acute distress.  EYES: Pupils equal, round, reactive to light and accommodation. No scleral icterus. Extraocular muscles intact.  HEENT: Head atraumatic, normocephalic. Oropharynx and nasopharynx clear.  NECK:  Supple, no jugular venous distention. No thyroid enlargement, no tenderness.  LUNGS: Diminished breath sounds bilaterally, no wheezing, rales,rhonchi or crepitation. No use of accessory muscles of respiration.  CARDIOVASCULAR: S1, S2 normal. No murmurs, rubs, or gallops.  ABDOMEN: Soft, nontender, nondistended. Bowel sounds present. No organomegaly or mass.  EXTREMITIES: No pedal edema, cyanosis, or clubbing.  NEUROLOGIC: Cranial nerves II through XII are intact. Muscle strength 5/5 in all extremities. Sensation intact. Gait not checked.  PSYCHIATRIC: The patient is alert and oriented x 3.  SKIN: No obvious rash, lesion, or ulcer.   LABORATORY PANEL:   CBC Recent Labs  Lab 02/05/18 1555  WBC 7.7  HGB 15.4  HCT 45.5  PLT 205   ------------------------------------------------------------------------------------------------------------------  Chemistries  Recent Labs  Lab 02/03/18 0950 02/05/18 1555  NA 137 135  K 4.3 4.5  CL 105 101  CO2 22 22  GLUCOSE 167* 342*  BUN 22* 34*  CREATININE 0.84 0.77  CALCIUM 8.7* 8.3*  AST 54*  --   ALT 25  --   ALKPHOS 45  --   BILITOT 0.9  --    ------------------------------------------------------------------------------------------------------------------  Cardiac Enzymes Recent Labs  Lab 02/05/18 1555  TROPONINI 0.36*   ------------------------------------------------------------------------------------------------------------------  RADIOLOGY:  Dg Chest 2 View  Result Date: 02/05/2018 CLINICAL DATA:  Shortness of breath. EXAM: CHEST  2 VIEW COMPARISON:  Radiographs of February 03, 2018. FINDINGS: The heart size and mediastinal contours are within normal limits. Atherosclerosis of thoracic aorta  is noted. Increased bibasilar interstitial densities are noted concerning for pulmonary edema. Minimal bilateral pleural effusions are noted. No pneumothorax is noted. The visualized skeletal structures are unremarkable. IMPRESSION: Increased bilateral pulmonary edema. Minimal bilateral pleural effusions. Aortic Atherosclerosis (ICD10-I70.0). Electronically Signed   By: Lupita Raider, M.D.   On: 02/05/2018 17:44      IMPRESSION AND PLAN:   COPD exacerbation and pulmonary edema due to influenza A. The patient will be admitted to medical floor. Start IV steroid, albuterol every 6 hours, Robitussin as needed.  Elevated troponin and CAD.  Possible due to demanding ischemia. Follow-up troponin level, continue aspirin 325 mg p.o. Daily, Lopressor and Lipitor, follow-up lipid panel, echocardiograph and cardiology consult.  Dehydration due to above.  Follow-up BMP. Gentle IV fluid support.  I think pulmonary edema is due to influenza A, not due to CHF.  But will follow-up echocardiograph.  Accelerated hypertension.  IV hydralazine as needed, start Lopressor.  Diabetes.  Start sliding scale. Hyperlipidemia.  Check lipid panel. Tobacco abuse.  Smoking cessation was counseled for 3-4 minutes.  Nicotine patch.  The patient wants to quit. All the records are reviewed and case discussed with ED provider. Management plans discussed with the patient, his fiance and they are in agreement.  CODE STATUS: Full code  TOTAL TIME TAKING CARE OF THIS PATIENT: 58 minutes.  Shaune Pollack M.D on 02/05/2018 at 6:38 PM  Between 7am to 6pm - Pager - [phone]  After 6pm go to [email or web address] - Social research officer, government  [facility] [place] [facility]  Office  [phone]  CC: Primary care physician; System, Pcp Not In   Note: This dictation was prepared with Dragon dictation along with smaller phrase technology. Any transcriptional errors that result from this process are unin

## 2018-02-05 NOTE — ED Notes (Signed)
Called to [facility]A they are unable to take pt at this time will return call

## 2018-02-05 NOTE — ED Triage Notes (Signed)
Pt was diagnosised with the Flu on Monday. Pt states that he was at his girlfriends house, and began to have a panic attack because he could not breath. [facility] gave him a duoneb. Pt is wanting oxygen, RX for 5 days, water and his girlfriend at bedside. Pt is able to speak in complete sentences at this time.

## 2018-02-06 ENCOUNTER — Encounter: Payer: Self-pay | Admitting: Nurse Practitioner

## 2018-02-06 ENCOUNTER — Inpatient Hospital Stay (HOSPITAL_COMMUNITY)
Admit: 2018-02-06 | Discharge: 2018-02-06 | Disposition: A | Discharge status: Home / Self Care | Payer: Medicare Other | Attending: Internal Medicine | Admitting: Internal Medicine

## 2018-02-06 ENCOUNTER — Encounter
Admission: EM | Disposition: A | Discharge status: Other Acute Inpatient Hospital | Payer: Self-pay | Source: Home / Self Care | Attending: Internal Medicine

## 2018-02-06 ENCOUNTER — Inpatient Hospital Stay
Admission: AD | Admit: 2018-02-06 | Payer: Self-pay | Source: Other Acute Inpatient Hospital | Admitting: Cardiovascular Disease

## 2018-02-06 DIAGNOSIS — I340 Nonrheumatic mitral (valve) insufficiency: Secondary | ICD-10-CM

## 2018-02-06 DIAGNOSIS — I214 Non-ST elevation (NSTEMI) myocardial infarction: Secondary | ICD-10-CM

## 2018-02-06 HISTORY — PX: LEFT HEART CATH AND CORONARY ANGIOGRAPHY: CATH118249

## 2018-02-06 LAB — BASIC METABOLIC PANEL
Anion gap: 8 (ref 5–15)
BUN: 30 mg/dL — ABNORMAL HIGH (ref 6–20)
CO2: 30 mmol/L (ref 22–32)
Calcium: 8.5 mg/dL — ABNORMAL LOW (ref 8.9–10.3)
Chloride: 101 mmol/L (ref 101–111)
Creatinine, Ser: 0.92 mg/dL (ref 0.61–1.24)
GFR calc Af Amer: >60 mL/min (ref >60)
GFR calc non Af Amer: >60 mL/min (ref >60)
Glucose, Bld: 306 mg/dL — ABNORMAL HIGH (ref 65–99)
Potassium: 5 mmol/L (ref 3.5–5.1)
Sodium: 139 mmol/L (ref 135–145)

## 2018-02-06 LAB — GLUCOSE, CAPILLARY
Glucose-Capillary: 199 mg/dL — ABNORMAL HIGH (ref 65–99)
Glucose-Capillary: 206 mg/dL — ABNORMAL HIGH (ref 65–99)
Glucose-Capillary: 193 mg/dL — ABNORMAL HIGH (ref 65–99)
Glucose-Capillary: 177 mg/dL — ABNORMAL HIGH (ref 65–99)

## 2018-02-06 LAB — APTT: aPTT: 49 seconds — ABNORMAL HIGH (ref 24–36)

## 2018-02-06 LAB — PROTIME-INR
INR: 0.99
Prothrombin Time: 13 seconds (ref 11.4–15.2)

## 2018-02-06 LAB — CBC
HCT: 42.9 % (ref 40.0–52.0)
Hemoglobin: 14.5 g/dL (ref 13.0–18.0)
MCH: 30.5 pg (ref 26.0–34.0)
MCHC: 33.7 g/dL (ref 32.0–36.0)
MCV: 90.3 fL (ref 80.0–100.0)
Platelets: 204 10*3/uL (ref 150–440)
RBC: 4.75 MIL/uL (ref 4.40–5.90)
RDW: 14 % (ref 11.5–14.5)
WBC: 5.2 10*3/uL (ref 3.8–10.6)

## 2018-02-06 LAB — LIPID PANEL
Cholesterol: 139 mg/dL (ref 0–200)
HDL: 42 mg/dL (ref >40)
LDL Cholesterol: 74 mg/dL (ref 0–99)
Total CHOL/HDL Ratio: 3.3 RATIO
Triglycerides: 116 mg/dL (ref <150)
VLDL: 23 mg/dL (ref 0–40)

## 2018-02-06 LAB — CARDIAC CATHETERIZATION: Cath EF Quantitative: 45 %

## 2018-02-06 LAB — ECHOCARDIOGRAM COMPLETE
Height: 66 in
Weight: 2699.2 oz

## 2018-02-06 LAB — HEMOGLOBIN A1C
Hgb A1c MFr Bld: 7 % — ABNORMAL HIGH (ref 4.8–5.6)
Mean Plasma Glucose: 154.2 mg/dL

## 2018-02-06 LAB — TROPONIN I: Troponin I: 2.65 ng/mL (ref <0.03)

## 2018-02-06 SURGERY — LEFT HEART CATH AND CORONARY ANGIOGRAPHY
Anesthesia: Moderate Sedation

## 2018-02-06 MED ORDER — HEPARIN (PORCINE) IN NACL 100-0.45 UNIT/ML-% IJ SOLN: 900.0000 [IU]/h | INTRAMUSCULAR | Status: DC

## 2018-02-06 MED ORDER — INSULIN ASPART 100 UNIT/ML ~~LOC~~ SOLN: 0.0000 [IU] | Freq: Every day | SUBCUTANEOUS | Status: DC

## 2018-02-06 MED ORDER — SODIUM CHLORIDE 0.9 % WEIGHT BASED INFUSION
3.0000 mL/kg/h | INTRAVENOUS | Status: DC
  Administered 2018-02-06: 3 mL/kg/h via INTRAVENOUS

## 2018-02-06 MED ORDER — SODIUM CHLORIDE 0.9% FLUSH
3.0000 mL | Freq: Two times a day (BID) | INTRAVENOUS | Status: DC
  Administered 2018-02-06: 3 mL via INTRAVENOUS

## 2018-02-06 MED ORDER — SODIUM CHLORIDE 0.9 % IV SOLN: 250.0000 mL | INTRAVENOUS | Status: DC | PRN

## 2018-02-06 MED ORDER — HEPARIN (PORCINE) IN NACL 100-0.45 UNIT/ML-% IJ SOLN
1200.0000 [IU]/h | INTRAMUSCULAR | Status: DC
  Administered 2018-02-06: 900 [IU]/h via INTRAVENOUS
  Filled 2018-02-06: qty 250

## 2018-02-06 MED ORDER — MIDAZOLAM HCL 2 MG/2ML IJ SOLN
INTRAMUSCULAR | Status: DC | PRN
  Administered 2018-02-06: 1 mg via INTRAVENOUS

## 2018-02-06 MED ORDER — FENTANYL CITRATE (PF) 100 MCG/2ML IJ SOLN
INTRAMUSCULAR | Status: AC
  Filled 2018-02-06: qty 2

## 2018-02-06 MED ORDER — HEPARIN (PORCINE) IN NACL 100-0.45 UNIT/ML-% IJ SOLN
900.0000 [IU]/h | INTRAMUSCULAR | Status: DC
  Administered 2018-02-06: 900 [IU]/h via INTRAVENOUS
  Filled 2018-02-06: qty 250

## 2018-02-06 MED ORDER — HEPARIN SODIUM (PORCINE) 1000 UNIT/ML IJ SOLN
INTRAMUSCULAR | Status: DC | PRN
  Administered 2018-02-06: 4000 [IU] via INTRAVENOUS

## 2018-02-06 MED ORDER — ATORVASTATIN CALCIUM 40 MG PO TABS: 40.0000 mg | ORAL_TABLET | Freq: Every day | ORAL | Status: DC

## 2018-02-06 MED ORDER — LOSARTAN POTASSIUM 50 MG PO TABS
50.0000 mg | ORAL_TABLET | Freq: Every day | ORAL | Status: DC
  Administered 2018-02-06: 50 mg via ORAL
  Filled 2018-02-06: qty 1

## 2018-02-06 MED ORDER — SODIUM CHLORIDE 0.9% FLUSH: 3.0000 mL | INTRAVENOUS | Status: DC | PRN

## 2018-02-06 MED ORDER — INSULIN ASPART 100 UNIT/ML ~~LOC~~ SOLN
0.0000 [IU] | Freq: Three times a day (TID) | SUBCUTANEOUS | Status: DC
  Administered 2018-02-06: 3 [IU] via SUBCUTANEOUS
  Administered 2018-02-07 (×2): 5 [IU] via SUBCUTANEOUS
  Filled 2018-02-06 (×3): qty 1

## 2018-02-06 MED ORDER — HEPARIN BOLUS VIA INFUSION
3000.0000 [IU] | Freq: Once | INTRAVENOUS | Status: AC
  Administered 2018-02-06: 3000 [IU] via INTRAVENOUS
  Filled 2018-02-06: qty 3000

## 2018-02-06 MED ORDER — INSULIN GLARGINE 100 UNIT/ML ~~LOC~~ SOLN
8.0000 [IU] | Freq: Every day | SUBCUTANEOUS | Status: DC
  Administered 2018-02-06: 8 [IU] via SUBCUTANEOUS
  Filled 2018-02-06 (×2): qty 0.08

## 2018-02-06 MED ORDER — VERAPAMIL HCL 2.5 MG/ML IV SOLN
INTRAVENOUS | Status: DC | PRN
  Administered 2018-02-06: 2.5 mg via INTRA_ARTERIAL

## 2018-02-06 MED ORDER — SODIUM CHLORIDE 0.9 % WEIGHT BASED INFUSION: 1.0000 mL/kg/h | INTRAVENOUS | Status: DC

## 2018-02-06 MED ORDER — IOPAMIDOL (ISOVUE-300) INJECTION 61%
INTRAVENOUS | Status: DC | PRN
  Administered 2018-02-06: 80 mL via INTRA_ARTERIAL

## 2018-02-06 MED ORDER — VERAPAMIL HCL 2.5 MG/ML IV SOLN
INTRAVENOUS | Status: AC
  Filled 2018-02-06: qty 2

## 2018-02-06 MED ORDER — MIDAZOLAM HCL 2 MG/2ML IJ SOLN
INTRAMUSCULAR | Status: AC
  Filled 2018-02-06: qty 2

## 2018-02-06 MED ORDER — FUROSEMIDE 10 MG/ML IJ SOLN
20.0000 mg | Freq: Two times a day (BID) | INTRAMUSCULAR | Status: DC
  Administered 2018-02-06 – 2018-02-07 (×2): 20 mg via INTRAVENOUS
  Filled 2018-02-06 (×2): qty 2

## 2018-02-06 MED ORDER — FENTANYL CITRATE (PF) 100 MCG/2ML IJ SOLN
INTRAMUSCULAR | Status: DC | PRN
  Administered 2018-02-06: 50 ug via INTRAVENOUS

## 2018-02-06 MED ORDER — ASPIRIN 81 MG PO CHEW: 81.0000 mg | CHEWABLE_TABLET | ORAL | Status: DC

## 2018-02-06 MED ORDER — SODIUM CHLORIDE 0.9% FLUSH: 3.0000 mL | Freq: Two times a day (BID) | INTRAVENOUS | Status: DC

## 2018-02-06 MED ORDER — EZETIMIBE 10 MG PO TABS
10.0000 mg | ORAL_TABLET | Freq: Every day | ORAL | Status: DC
Start: 2018-02-06 — End: 2018-02-07
  Administered 2018-02-06 – 2018-02-07 (×2): 10 mg via ORAL
  Filled 2018-02-06 (×2): qty 1

## 2018-02-06 MED ORDER — HEPARIN SODIUM (PORCINE) 1000 UNIT/ML IJ SOLN
INTRAMUSCULAR | Status: AC
  Filled 2018-02-06: qty 1

## 2018-02-06 MED ORDER — INSULIN ASPART 100 UNIT/ML ~~LOC~~ SOLN: 0.0000 [IU] | Freq: Three times a day (TID) | SUBCUTANEOUS | Status: DC

## 2018-02-06 SURGICAL SUPPLY — 5 items
CATH OPTITORQUE JACKY 4.0 5F (CATHETERS) ×2 IMPLANT
GLIDESHEATH SLEND SS 6F .021 (SHEATH) ×2 IMPLANT
KIT MANI 3VAL PERCEP (MISCELLANEOUS) ×2 IMPLANT
PACK CARDIAC CATH (CUSTOM PROCEDURE TRAY) ×2 IMPLANT
WIRE ROSEN-J .035X260CM (WIRE) ×2 IMPLANT

## 2018-02-06 NOTE — Progress Notes (Signed)
Patient blood glucose is in the 300's. Patient has no order for sliding scale. Notified Dr.Daimond for insulin correction orders.

## 2018-02-06 NOTE — Consult Note (Signed)
Cardiology Consult    Patient ID: Jaizon Graffeo MRN: [id number], DOB/AGE: 1946/07/23   Admit date: 02/05/2018 Date of Consult: 02/06/2018  Primary Physician: System, Pcp Not In Primary Cardiologist: Dr. Army Chaco - [place] [phone] Requesting Provider: Q. Imogene Burn, MD  Patient Profile    Zykeem Ifft is a 72 y.o. male with a history of CAD, HTN, HL, DMII, Tob abuse, and COPD, who is being seen today for the evaluation of NSTEMI at the request of Dr. Imogene Burn.  Past Medical History   Past Medical History:  Diagnosis Date  . Bilateral carotid bruits   . CAD (coronary artery disease)    a. 1998 s/p MI and BMS [place], [place]); b. 1999 redo PCI/rotablator in setting of what sounds like ISR;  c. Multiple stress tests over the years - last ~ 2017, reportedly nl.  Marland Kitchen COPD (chronic obstructive pulmonary disease) (HCC)   . Diabetes mellitus without complication (HCC)   . HTN (hypertension)   . Hypercholesteremia   . Tobacco abuse     History reviewed. No pertinent surgical history.   Allergies  No Known Allergies  History of Present Illness    72 y/o ? with a h/o CAD s/p MI in ~ 1998 with bare metal stenting.  Within a few months, he had recurrent c/p and required repeat intervention, possibly rotablator based on his description.  Other hx includes HTN, HL, DMII, Tob Abuse, and COPD.  He has regular cardiology f/u in [place]. [place], [place], and says that he has had multiple stress tests over the years - the last of which taking place about 2 yrs ago, which was normal.  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  In that setting, he is reasonably active, but does not routinely exercise.  He has been doing well over the past few years w/o any significant symptoms or limitations.  He still lives in [place] but his girlfriend is in the [place], [place] area.  He drove down last weekend and on 2/16, he felt a little worn out, with low-grade fever, dyspnea, cough,  and wheezing.  He was seen @ a local urgent care and was given inhalers and prednisone.  Symptoms didn't really improve, so on Monday, 2/18, he presented to the [facility] ED for evaluation of both c/p and sob.  ECG non-acute.  CXR w/ mild diffuse pulmonary interstitial opacity-question chronic.  He tested + for influenza A.  He was prescribed Tamiflu, Tussionex, and DuoNeb nebulizer and discharged home.  He says he never filled the Tamiflu but continued on previously prescribed prednisone as well as nebulizer and inhaler therapy and started feeling better over the course of this week.  He felt nearly back to baseline on the morning of February 20, but then around 2 PM or so, he had sudden onset of profound dyspnea.  He tried using his nebulizer without relief and then called EMS.  He says that upon EMS arrival, his blood pressure was well over 200 systolic.  He was placed on oxygen and he says he began to feel little bit better once he was in the ambulance.  At no point did he have chest pain.  On arrival to the ER, blood pressure was 180/84.  ECG was notable for sinus tachycardia with subtle ST depression in V5 and V6.  Chest x-ray showed pulmonary edema and troponin was elevated at 0.36.  He was treated with IV Lasix, IV Solu-Medrol, nebulizers, beta-blocker, and  calcium channel blocker.  He had improvement in dyspnea and was admitted for further evaluation.  Since admission, he has had no further dyspnea.  He continues to deny chest pain.  Troponin has risen to 2.65 this morning.  He is now on heparin and cardiology has been asked to evaluate.  Inpatient Medications    . albuterol  2.5 mg Nebulization Q6H  . [START ON 02/07/2018] aspirin  81 mg Oral Pre-Cath  . aspirin EC  325 mg Oral Daily  . atorvastatin  40 mg Oral q1800  . insulin aspart  0-15 Units Subcutaneous TID WC  . insulin aspart  0-5 Units Subcutaneous QHS  . methylPREDNISolone (SOLU-MEDROL) injection  60 mg Intravenous Q12H  . metoprolol  tartrate  25 mg Oral BID  . nicotine  14 mg Transdermal Daily  . oseltamivir  75 mg Oral BID  . sodium chloride flush  3 mL Intravenous Q12H  . sodium chloride flush  3 mL Intravenous Q12H  Amlodipine 2.5 mg daily  Family History    Family History  Problem Relation Age of Onset  . Lymphoma Mother   . Peripheral vascular disease Father    indicated that his mother is deceased. He indicated that his father is deceased.   Social History    Social History   Socioeconomic History  . Marital status: Widowed    Spouse name: Not on file  . Number of children: Not on file  . Years of education: Not on file  . Highest education level: Not on file  Social Needs  . Financial resource strain: Not on file  . Food insecurity - worry: Not on file  . Food insecurity - inability: Not on file  . Transportation needs - medical: Not on file  . Transportation needs - non-medical: Not on file  Occupational History  . Not on file  Tobacco Use  . Smoking status: Current Every Day Smoker    Packs/day: 1.00    Years: 15.00    Pack years: 15.00    Types: Cigarettes    Last attempt to quit: 02/02/2018    Years since quitting: 0.0  . Smokeless tobacco: Never Used  Substance and Sexual Activity  . Alcohol use: No    Frequency: Never  . Drug use: No  . Sexual activity: Yes  Other Topics Concern  . Not on file  Social History Narrative   Lives in [place] by himself.  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  Fairly active but doesn't routinely exercise.     Review of Systems    General:  No chills, +++ low grade fever with weakness and malaise starting 2/16.  No night sweats or weight changes.  Cardiovascular:  No chest pain, +++ dyspnea, no edema, orthopnea, palpitations, paroxysmal nocturnal dyspnea. Dermatological: No rash, lesions/masses Respiratory: +++ cough, +++ dyspnea, +++ wheezing Urologic: No hematuria, dysuria Abdominal:   No nausea, vomiting, diarrhea,  bright red blood per rectum, melena, or hematemesis Neurologic:  No visual changes, wkns, changes in mental status. All other systems reviewed and are otherwise negative except as noted above.  Physical Exam    Blood pressure (!) 154/55, pulse 71, temperature 98.1 F (36.7 C), temperature source Oral, resp. rate 18, height 5\' 6"  (1.676 m), weight 168 lb 10.4 oz (76.5 kg), SpO2 97 %.  General: Pleasant, NAD Psych: Normal affect. Neuro: Alert and oriented X 3. Moves all extremities spontaneously. HEENT: Normal  Neck: Supple without JVD.  Bilat carotid bruits. Lungs:  Resp regular and unlabored, coarse breath sounds throughout. Heart: RRR no s3, s4, or murmurs. Abdomen: Soft, non-tender, non-distended, BS + x 4.  Extremities: No clubbing, cyanosis or edema. DP/PT/Radials 2+ and equal bilaterally.  Labs     Recent Labs    02/05/18 1555 02/05/18 2106 02/06/18 0244  TROPONINI 0.36* 1.74* 2.65*   Lab Results  Component Value Date   WBC 5.2 02/06/2018   HGB 14.5 02/06/2018   HCT 42.9 02/06/2018   MCV 90.3 02/06/2018   PLT 204 02/06/2018    Recent Labs  Lab 02/03/18 0950  02/06/18 0244  NA 137   < > 139  K 4.3   < > 5.0  CL 105   < > 101  CO2 22   < > 30  BUN 22*   < > 30*  CREATININE 0.84   < > 0.92  CALCIUM 8.7*   < > 8.5*  PROT 7.1  --   --   BILITOT 0.9  --   --   ALKPHOS 45  --   --   ALT 25  --   --   AST 54*  --   --   GLUCOSE 167*   < > 306*   < > = values in this interval not displayed.   Lab Results  Component Value Date   CHOL 139 02/06/2018   HDL 42 02/06/2018   LDLCALC 74 02/06/2018   TRIG 116 02/06/2018     Radiology Studies    Dg Chest 2 View  Result Date: 02/05/2018 CLINICAL DATA:  Shortness of breath. EXAM: CHEST  2 VIEW COMPARISON:  Radiographs of February 03, 2018. FINDINGS: The heart size and mediastinal contours are within normal limits. Atherosclerosis of thoracic aorta is noted. Increased bibasilar interstitial densities are noted  concerning for pulmonary edema. Minimal bilateral pleural effusions are noted. No pneumothorax is noted. The visualized skeletal structures are unremarkable. IMPRESSION: Increased bilateral pulmonary edema. Minimal bilateral pleural effusions. Aortic Atherosclerosis (ICD10-I70.0). Electronically Signed   By: Lupita Raider, M.D.   On: 02/05/2018 17:44   Dg Chest 2 View  Result Date: 02/03/2018 CLINICAL DATA:  72 year old male with shortness of breath and chest pain. Symptoms not improved despite recently starting prednisone and antibiotics. Smoker. EXAM: CHEST  2 VIEW COMPARISON:  None. FINDINGS: No prior study for comparison. Large lung volumes. Normal cardiac size and mediastinal contours. Visualized tracheal air column is within normal limits. No pneumothorax, pulmonary edema, pleural effusion or confluent pulmonary opacity. Mild diffuse increased pulmonary interstitial markings are age indeterminate, probably chronic. No acute osseous abnormality identified. Negative visible bowel gas pattern. IMPRESSION: 1. Pulmonary hyperinflation. Mild diffuse pulmonary interstitial opacity, probably chronic and smoking related, but consider also viral/atypical respiratory infection. 2. No pleural effusion or other acute cardiopulmonary abnormality. Electronically Signed   By: Odessa Fleming M.D.   On: 02/03/2018 10:25    ECG & Cardiac Imaging    Sinus tachycardia, 109, left atrial enlargement, prior inferior infarct, less than 1 mm flat to upsloping ST segment depression in lateral leads which was not present earlier in the week.  Assessment & Plan    1.  Non-STEMI/coronary artery disease: Patient with prior history of CAD status post prior infarct and stenting in approximately 1998 with subsequent repeat procedure a few months later.  ECG suggest prior inferior infarct.  He has undergone stress testing over the years and reportedly had a normal stress test about 2 years ago.  He is followed by cardiology  in [place].  He was diagnosed with influenza A on February 18 and had been having dyspnea, wheezing, and general malaise.  Though he was prescribed Tamiflu in the emergency department on the 18th, he had not been taking this.  Symptoms seemed to improve with steroids and nebulizers.  He had acute onset of dyspnea on the afternoon of February 20, prompting him to call EMS.  He was found to be markedly hypertensive and mildly tachycardic.  In the ER, he was noted to have pulmonary edema on his chest x-ray while troponin was mildly elevated at 0.36.  Troponin has since risen to 2.65.  Of note, patient denies experiencing any chest pain.  He is not having any dyspnea this morning.  He is euvolemic on exam.  Continue aspirin, statin, beta-blocker.  Heparin has been ordered.  We have discussed options for management and have agreed to proceed with diagnostic catheterization. The patient understands that risks include but are not limited to stroke (1 in 1000), death (1 in 1000), kidney failure [usually temporary] (1 in 500), bleeding (1 in 200), allergic reaction [possibly serious] (1 in 200), and agrees to proceed.  2.  Essential hypertension/hypertensive urgency: Patient's blood pressure was markedly elevated on arrival.  This was in the setting of acute respiratory failure and pulmonary edema.  He is on beta-blocker, calcium channel blocker, and nitrate therapy chronically and he reports compliance.  Pressure remains elevated in the 150s this morning.  I will titrate his amlodipine to 5 mg daily.  Continue current dose of beta-blocker.  I would not titrate  blocker at this time in the setting of recent COPD flare.  3.  Hyperlipidemia: He is on simvastatin and Zetia at home.  LDL was 74 this morning.  He is on Lipitor here and I will escalate this to 80 mg.  4.  Tobacco abuse: He says he quit 4 days ago when he started having significant wheezing and respiratory failure.  He may require a nicotine patch as he has been  smoking a pack a day for many years.  Complete cessation advised.  5.  COPD: With recent exacerbation.  Inhaler/nebulizer therapy per internal medicine.  6.  Influenza A: He tested positive on February 18.  He says he has not been taking Tamiflu but seems to be feeling better.  Defer to internal medicine.  7.  Bilateral carotid bruits: He is not sure if he has been told this before.  He thinks he may have had carotid ultrasounds in the past.  He will need outpatient follow-up with his primary cardiologist in [place].  Continue aspirin and statin.  Signed, Nicolasa Ducking, NP 02/06/2018, 10:04 AM  For questions or updates, please contact   Please consult [email or web address] for contact info under Cardiology/STEMI.

## 2018-02-06 NOTE — Progress Notes (Signed)
[facility] - [place] at [facility]   PATIENT NAME: Harold Parker    MR#:  [id number]  DATE OF BIRTH:  [birth year]  SUBJECTIVE:  CHIEF COMPLAINT:   Chief Complaint  Patient presents with  . Panic Attack  . Influenza  . Shortness of Breath   SOB, no chest pain, on O2 [place] 4 L. REVIEW OF SYSTEMS:  Review of Systems  Constitutional: Negative for chills, fever and malaise/fatigue.  HENT: Negative for sore throat.   Eyes: Negative for blurred vision and double vision.  Respiratory: Positive for shortness of breath. Negative for cough, hemoptysis, wheezing and stridor.   Cardiovascular: Negative for chest pain, palpitations, orthopnea and leg swelling.  Gastrointestinal: Negative for abdominal pain, blood in stool, diarrhea, melena, nausea and vomiting.  Genitourinary: Negative for dysuria, flank pain and hematuria.  Musculoskeletal: Negative for back pain and joint pain.  Skin: Negative for rash.  Neurological: Negative for dizziness, sensory change, focal weakness, seizures, loss of consciousness, weakness and headaches.  Endo/Heme/Allergies: Negative for polydipsia.  Psychiatric/Behavioral: Negative for depression. The patient is not nervous/anxious.     DRUG ALLERGIES:  No Known Allergies VITALS:  Blood pressure (!) 154/55, pulse 76, temperature (!) 96.8 F (36 C), temperature source Oral, resp. rate (!) 22, height 5\' 6"  (1.676 m), weight 168 lb 10.4 oz (76.5 kg), SpO2 97 %. PHYSICAL EXAMINATION:  Physical Exam  Constitutional: He is oriented to person, place, and time and well-developed, well-nourished, and in no distress.  HENT:  Head: Normocephalic.  Mouth/Throat: Oropharynx is clear and moist.  Eyes: Conjunctivae and EOM are normal. Pupils are equal, round, and reactive to light. No scleral icterus.  Neck: Normal range of motion. Neck supple. No JVD present. No tracheal deviation present.  Cardiovascular: Normal rate, regular rhythm and normal heart  sounds. Exam reveals no gallop.  No murmur heard. Pulmonary/Chest: Effort normal and breath sounds normal. No respiratory distress. He has no wheezes. He has no rales.  Coarse breath sounds.  Abdominal: Soft. Bowel sounds are normal. He exhibits no distension. There is no tenderness. There is no rebound.  Musculoskeletal: Normal range of motion. He exhibits no edema or tenderness.  Neurological: He is alert and oriented to person, place, and time. No cranial nerve deficit.  Skin: No rash noted. No erythema.  Psychiatric: Affect normal.   LABORATORY PANEL:  Male CBC Recent Labs  Lab 02/06/18 0244  WBC 5.2  HGB 14.5  HCT 42.9  PLT 204   ------------------------------------------------------------------------------------------------------------------ Chemistries  Recent Labs  Lab 02/03/18 0950  02/05/18 2106 02/06/18 0244  NA 137   < >  --  139  K 4.3   < >  --  5.0  CL 105   < >  --  101  CO2 22   < >  --  30  GLUCOSE 167*   < >  --  306*  BUN 22*   < >  --  30*  CREATININE 0.84   < >  --  0.92  CALCIUM 8.7*   < >  --  8.5*  MG  --   --  2.0  --   AST 54*  --   --   --   ALT 25  --   --   --   ALKPHOS 45  --   --   --   BILITOT 0.9  --   --   --    < > = values in this interval not displayed.  RADIOLOGY:  Dg Chest 2 View  Result Date: 02/05/2018 CLINICAL DATA:  Shortness of breath. EXAM: CHEST  2 VIEW COMPARISON:  Radiographs of February 03, 2018. FINDINGS: The heart size and mediastinal contours are within normal limits. Atherosclerosis of thoracic aorta is noted. Increased bibasilar interstitial densities are noted concerning for pulmonary edema. Minimal bilateral pleural effusions are noted. No pneumothorax is noted. The visualized skeletal structures are unremarkable. IMPRESSION: Increased bilateral pulmonary edema. Minimal bilateral pleural effusions. Aortic Atherosclerosis (ICD10-I70.0). Electronically Signed   By: Lupita Raider, M.D.   On: 02/05/2018 17:44    ASSESSMENT AND PLAN:   Non-STEMI and CAD Elevated troponin up to 2.65.   Started heparin drip this am, continue aspirin 325 mg p.o. Daily, Lopressor and Lipitor, follow-up cardiac cath today.  Follow-up echocardiograph: EF 45-50% with severe hypokinesis of the mid to distal segments including anterior, anteroseptal and inferior walls.  Moderate aortic stenosis (per echo).  Follow-up [place] cardiologist as outpatient.   Acute respiratory failure with hypoxia due to COPD exacerbation and pulmonary edema Secondary to influenza A. Continue IV steroid, albuterol every 6 hours, Robitussin as needed.  Dehydration due to above.  Follow-up BMP. Gentle IV fluid support. Accelerated hypertension.  IV hydralazine as needed, start Lopressor.  Diabetes.  Start lantus 8 unit HS and continue sliding scale. Hyperlipidemia.  Check lipid panel. Tobacco abuse.  Smoking cessation was counseled for 3-4 minutes.  Nicotine patch.  The patient wants to quit.  All the records are reviewed and case discussed with Care Management/Social Worker. Management plans discussed with the patient, family and they are in agreement.  CODE STATUS: Full Code  TOTAL TIME TAKING CARE OF THIS PATIENT: 38 minutes.   More than 50% of the time was spent in counseling/coordination of care: YES  POSSIBLE D/C IN 2 DAYS, DEPENDING ON CLINICAL CONDITION.   Shaune Pollack M.D on 02/06/2018 at 12:05 PM  Between 7am to 6pm - Pager - [phone]  After 6pm go to [email or web address] - Therapist, nutritional Hospitalists

## 2018-02-06 NOTE — Progress Notes (Signed)
ANTICOAGULATION CONSULT NOTE  Pharmacy Consult for Heparin drip Indication: chest pain/ACS  No Known Allergies  Patient Measurements: Height: 5\' 6"  (167.6 cm) Weight: 168 lb 10.4 oz (76.5 kg) IBW/kg (Calculated) : 63.8 Heparin Dosing Weight: 76  Vital Signs: Temp: 96.8 F (36 C) (02/21 1122) Temp Source: Oral (02/21 1122) BP: 166/62 (02/21 1400) Pulse Rate: 78 (02/21 1400)  Labs: Recent Labs    02/05/18 1555 02/05/18 2106 02/06/18 0244 02/06/18 0848  HGB 15.4  --  14.5  --   HCT 45.5  --  42.9  --   PLT 205  --  204  --   APTT  --   --   --  49*  LABPROT  --   --   --  13.0  INR  --   --   --  0.99  CREATININE 0.77  --  0.92  --   TROPONINI 0.36* 1.74* 2.65*  --     Estimated Creatinine Clearance: 64.5 mL/min (by C-G formula based on SCr of 0.92 mg/dL).   Medical History: Past Medical History:  Diagnosis Date  . Bilateral carotid bruits   . CAD (coronary artery disease)    a. 1998 s/p MI and BMS ([place], [place]); b. 1999 redo PCI/rotablator in setting of what sounds like ISR;  c. Multiple stress tests over the years - last ~ 2017, reportedly nl; d. 01/2018 NSTEMI/Cath: LM 85m/d, LAD 50p, 40p/m, D1 60ost, OM1 95, RCA 100ost/p w/ L->R collats, EF 45%.  . COPD (chronic obstructive pulmonary disease) (HCC)   . Diabetes mellitus without complication (HCC)   . HTN (hypertension)   . Hypercholesteremia   . Ischemic cardiomyopathy    a. 01/2018 Echo: EF 40-45%, mid-apicalanteroseptal, ant, apical sev HK, mod apicalinfNewman PiesrOrthoatlanta Surgery Center O4Kind[facility] Beaches98 Woo2 HudsWestern Regional Medical Center Cancer HospitMarland KitchenaDMarland [id number] Endoscopy CenMunson Healthcare CadilShaune Pollackres Pattye Haven AS, mild MR, mod dil LA, PASP <MMansoKenNewman PiesuSpringfie8 Patrick B Harris Psychiatric HospitalTailwa8 N. LocuMemorial HospitMarland KitchenaDMarland [id number] Regional Medical Center SoAdvanced Surgery Center Of Clifton Shaune Pollackres PattManNew JerseLyndaPhysicians Medical CenterSurgicNewman PieslAdvanced Specialty [address] W. Monongahela Valley Hospitalayflo971 HudHayward Area Memorial HospitMarland KitchenaDMarland [id number] Care Hospitals Of DayRice Medical CenShaune Pollackres Pattye Haven N. [place] CFranciscanNewman PiesMSt 48 Dimensions Surgery CenterRockwe7740 OverlBlair Endoscopy Center LMarland KitchenLDMarland [id number] Sinai Beth IsrOrlando Health South Seminole HospiShaune Pollackres Pattye HavenLynWellstar<MEASUREMENSGames develope4Newman Pies6 Century City Endoscopy LLCStudeb9383 MarSentara Obici HospitMarland KitchenaDMarland Kitche8274Newman Pies5Carls999 WindLawrence General Hospitaling Wa688 AndoveCountryside Surgery Center LMarland KitchentDMarland [id number] Dc Va MedicNewman PieslFour CountLee And Bae Gi Medical Corporationy1 Pil121 SeCharles River Endoscopy LMarland KitchenLDMarland [id number] PiBehavioral HealthNewman PiesHActd LLC Dba Green Mount7Betsy Johnson Hospital567 539447 HudsonJames A Haley Veterans' HospitMarland KitchenaDMarland [id number] County Memorial HospiHemet Valley Medical CenShaune Pollackres Pattye Havenrial HospiMarland Kitche8279ShMansoKentucNew JKindred Rehabilitation Hospital Northeast HousEast Bay Surgery CNewman PiesnCh Ambulatory Surgery Cente56 W. ShaChippewa County War Memorial Hospitaldow Br8601 JacksHiLLCrest Hospital CushiMarland KitchennDMarland [id number] PiesSEll796Methodist Stone Oak Hospital S. Ta461 AugustaMarion Il Va Medical CentMarland KitcheneDMarland [id number] Memorial Regional Medical CenOrthopaedic Surgery Center Of Phoenicia Shaune Pollackres Pattye Haveny Urology Surgical Center Shaune <MEASUREMEMansoKentucNew JerseLyndWernersville State HospiPortland Cl469606nUpdegrafMansoKentucNew JerseLyndalDignity Health St. Rose DSt Joseph Medical Center-MSheridan Surgical Center46986LArbour HMansoKentucNew JerseLSt. Luke'S HospiPresencNewma7173 South Miami HospitalHomest71 StonybroSummit Surgery Centere St Marys GaleMarland KitchennDMarland [id number] County Endoscopy CenMurray County Mem HShaune Pollackres Pattye HavenoKentucNew JerseLyndEmory University<MEASURMansoKentucNew JerseLyndaAdvanceNewman Pies Del8994 PiSurgicare Surgical Associates Of Oradell LLCneknol44 RockcreDouglas Community Hospital, IMarland KitchennDMarland [id number] Medical Center Of IllinSaint Luke'S Hospital Of Kansas CShaune Pollackres Pattye HavenUREMENSGames develope72rmEncompaMansoKentucNew Aria Health FrankfKiowaMansoKentucNew JerseLyndal Cedars SinPeters Township Surgery CenSelect Specialty Hospital - Greens469766oMeMansoKentucNew JerseLynAdvoNewman PiesaSt Joseph Icon Surgery Center Of Denver419 Br2 Henry SmithBig Sky Surgery Center LMarland KitchenLDMarland [id number] Therapy Triangle Orthopaedics Surgery CenShaune Pollackres PatManNew JerseLyndaGlobal Rehab Rehabilitation Hospit67 Maple CouDoctors Surgical Partnership Ltd Dba Melbourne SameMansoNewman PiesMemorial Hospital HixsoneK66 N279 OaklAsante Ashland Community HospitMarland KitchenaDMarland [id number] Ms Medical CenWestchester General HospiShaune PoNewman PieslKindre59 N. TJackson Surgery Center LLChatche46 LibeNorthwest Medical CentMarland KitcheneDMarland [id number] Austin Surgery CenterUnm Sandoval Regional Medical CenShaune Pollackres PatNewman PiesySpicew7907 John D Archbold Memorial Hospitalottag119 HilldCoastal Surgery Center LMarland KitchenLDMarland [id number] Pies Gastroenterology Diagnostics Of Nort265Sci-Waymart Forensic Treatment Center Woodl8188 HarvWalton Rehabilitation HospitMarland KitchenaDMarland [id number] Health Insitute HospiParker Adventist HospiShaune Pollackres PattyNewman Pies Central Coast En8213 De7843 Valley ViMarland KitchenewMarland Kitchen04Madison Memorial HospiOrlando Center For Outpatient Surgery LPtal9. Laneeone HavenRegional MedicMansoKentuNewman PiesNWes628 Cedar-Sinai Marina Del Rey HospitalN. Fai914 Surgical Specialty Associates LMarland KitchenLDMarland [id number] Coast Medical Center Lee MemoriaTeNewman PiesaHallandale Outpatient Maine Eye Center Pa38 Loo24 CoCuero Community HospitMarland KitchenaDMarland [id number] Hosp & Granite MaBerstein Hilliker Hartzell Eye CenterNewman PiesLGrisell6Evanston Regional Hospital Goldf391 CanBaylor Isela Stantz & White Medical Center - PflugervilMarland KitchenlDMarland [id number] Hills Surgery CenterBeaver County Memorial HospiShaune Pollackres PNewman PiestThe CoopeLimestone Medical Center Incr U61 892 CemetSeton Medical Center Harker HeighMarland KitcNewman PieseSt Vinc9243 Complex Care Hospital At RidgelakeNew Sa412 HamiltDiscover Vision Surgery And Laser Center LMarland KitchenLDMarland Kitche825295Newman Pies4EaThe Eye Surgery Center Of East Tennessee7852 F145 LanteJerold PheLPs Community HospitMarland KitchenaDMarland [id number] LumbertonGlen Lehman Endoscopy SuShaune Pollackres Pattye Havendical CenColumbus Eye Surgery CenShaune <MMansoKentucNew JerseLyndaAustin Gi Minnesota Eye Institute Surgery Center Lewis And Clark Orthopaedic Institute469376LBatoMansoKentucNew JerseLyndalRml Health Providers Newman PiestUchealth 50Levindale Hebrew Geriatric Center & Hospital Durha363 EdgewoJames J. Peters Va Medical CentMarland KitcheneDMarland [id number] Gi Surgical Center Advanced Endoscopy Center PShaune Pollackres Pattye Haven Rml HinsdaPromise HosSelect SNewman PieseTexas Health Harris Methodist Hospital SoValley Regional Surgery Center8978 M9580 North BridSurgicare Of St Andrews LMarland KitchentDMarland Kitche82329564Morristown-Hamblen Healthcare SysCenter FNewman PiesrSt Lu614 E. LEndoscopy Center Of Dayton North LLCafayet577 TrusBrecksville Surgery CMarland KitchentDMarland [id number] San CristoEye Surgery And Laser Center Shaune Pollackres Pattye Havenhaune <MEASUREMENSGames develope30rmSoutheast Georgia HealthPecos ealthcare Systemmador HospitThe Ocular Surgery CenLeero7 Shore Stree n h/o CAD admitted with with SOB, chest pain, and elevated troponin being worked up for possible ACS. Patient received Lovenox 40 mg last PM and is to begin heparin infusion this AM.    Goal of Therapy:  Heparin level  0.3-0.7 units/ml Monitor platelets by anticoagulation protocol: Yes   Plan:  Give 3000 units bolus x 1 (reduced bolus of 40 units/kg due to recent Lovenox administration) Start heparin infusion at 900 units/hr (12 units/kg/hr) Check anti-Xa level in 8 hours and daily while on heparin Continue to monitor H&H and platelets  2/21 @ 1530: Cardiac cath revealed severe three-vessel CAD and aortic stenosis. Patient will be transferred to [place] for CABG and AVR. Heparin infusion to resume 8 hours post sheath removal (1230). Will resume heparin infusion at 2030 at previous rate without bolus. Will check HL/CBC at  0430.   Nyaire Denbleyker D 02/06/2018,3:30 PM

## 2018-02-06 NOTE — Discharge Summary (Signed)
[facility] - [place] [facility]   PATIENT NAME: Harold Parker    MR#:  [id number]  DATE OF BIRTH:  [birth year]  DATE OF ADMISSION:  02/05/2018   ADMITTING PHYSICIAN: Shaune Pollack, MD  DATE OF DISCHARGE: No discharge date for patient encounter.  PRIMARY CARE PHYSICIAN: System, Pcp Not In   ADMISSION DIAGNOSIS:  Acute pulmonary edema (HCC) [J81.0] COPD exacerbation (HCC) [J44.1] NSTEMI (non-ST elevated myocardial infarction) (HCC) [I21.4] Acute respiratory failure with hypoxia (HCC) [J96.01] DISCHARGE DIAGNOSIS:  Principal Problem:   NSTEMI (non-ST elevated myocardial infarction) (HCC) Active Problems:   COPD exacerbation (HCC)  SECONDARY DIAGNOSIS:   Past Medical History:  Diagnosis Date  . Bilateral carotid bruits   . CAD (coronary artery disease)    a. 1998 s/p MI and BMS [facility] b. 1999 redo PCI/rotablator in setting of what sounds like ISR;  c. Multiple stress tests over the years - last ~ 2017, reportedly nl; d. 01/2018 NSTEMI/Cath: LM 43m/d, LAD 50p, 40p/m, D1 60ost, OM1 95, RCA 100ost/p w/ L->R collats, EF 45%.  Marland Kitchen COPD (chronic obstructive pulmonary disease) (HCC)   . Diabetes mellitus without complication (HCC)   . HTN (hypertension)   . Hypercholesteremia   . Ischemic cardiomyopathy    a. 01/2018 Echo: EF 40-45%, mid-apicalanteroseptal, ant, apical sev HK, mod apicalinferior HK. Gr2 DD. Mod AS, mild MR, mod dil LA, PASP .  . Tobacco abuse    HOSPITAL COURSE:   Non-STEMI and CAD Elevated troponinup to 2.65.  Started heparin drip this am, continue aspirin 325 mg p.o. Daily,Lopressorand Lipitor, follow-up cardiac cath today.  Follow-up echocardiograph: EF 45-50% with severe hypokinesis of the mid to distal segments including anterior, anteroseptal and inferior walls. Cardiac cath today show: 1.  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  The coronary arteries are overall heavily calcified and diffusely diseased. 2.  Mildly reduced LV systolic function with an EF of 40%.  Echocardiogram showed wall motion abnormalities in the anterior as well as inferior walls.  3.  Severely elevated filling pressures in the setting of severely elevated blood pressure. 4.  Moderate aortic stenosis with a peak to peak gradient of 20 mmHg.  Recommendations by Dr. Kirke Corin: Transfer to [facility] for CABG and aortic valve replacement.  The patient will need to be tuned up given flu.  He also appears to be volume overloaded and blood pressure is uncontrolled.  I added losartan and will diuresis with furosemide. Heparin drip to be resumed in 8 hours.   Acute respiratory failure with hypoxia due to COPD exacerbation and pulmonary edema Secondary to influenzaA. Continue IV steroid, albuterol every 6 hours, Robitussin as needed.  Dehydration due to above. Follow-up BMP.  Accelerated hypertension. IV hydralazine as needed, added losartan and will diuresis with furosemide.  Diabetes. Start lantus 8 unit HS and continue sliding scale. Hyperlipidemia. Check lipid panel. Tobacco abuse. Smoking cessation was counseled for 3-4 minutes. Nicotine patch. The patient wantsto quit. DISCHARGE CONDITIONS:  Guarded.  Transferred to [facility] for CABG. CONSULTS OBTAINED:  Treatment Team:  Iran Ouch, MD DRUG ALLERGIES:  No Known Allergies DISCHARGE MEDICATIONS:   Allergies as of 02/06/2018   No Known Allergies     Medication List    STOP taking these medications   atenolol 50 MG tablet Commonly known as:  TENORMIN   metFORMIN 500 MG tablet Commonly known as:  GLUCOPHAGE   simvastatin 20 MG tablet  Commonly known as:  ZOCOR     TAKE these medications   amLODipine 2.5 MG tablet Commonly known as:  NORVASC Take 2.5 mg by mouth 2 (two) times daily.   aspirin 325 MG EC tablet Take 325 mg by mouth daily.   atorvastatin  40 MG tablet Commonly known as:  LIPITOR Take 1 tablet (40 mg total) by mouth daily at 6 PM.   chlorpheniramine-HYDROcodone 10-8 MG/5ML Suer Commonly known as:  TUSSIONEX PENNKINETIC ER Take 5 mLs by mouth 2 (two) times daily.   ezetimibe 10 MG tablet Commonly known as:  ZETIA Take 10 mg by mouth daily.   folic acid 400 MCG tablet Commonly known as:  FOLVITE Take 400 mcg by mouth every evening.   Grapeseed Extract 500-50 MG Caps Take 1 tablet by mouth 2 (two) times daily.   heparin 100-0.45 UNIT/ML-% infusion Inject 900 Units/hr into the vein continuous.   ipratropium-albuterol 0.5-2.5 (3) MG/3ML Soln Commonly known as:  DUONEB Take 3 mLs by nebulization every 6 (six) hours as needed.   isosorbide mononitrate 60 MG 24 hr tablet Commonly known as:  IMDUR Take 60 mg by mouth daily.   oseltamivir 75 MG capsule Commonly known as:  TAMIFLU Take 1 capsule (75 mg total) by mouth 2 (two) times daily for 5 days.   Resveratrol 250 MG Caps Take 500 mg by mouth 2 (two) times daily.   vitamin C 1000 MG tablet Take 1,000 mg by mouth daily.   VITAMIN D (CHOLECALCIFEROL) PO Take by mouth every morning.        DISCHARGE INSTRUCTIONS:  See AVS/  If you experience worsening of your admission symptoms, develop shortness of breath, life threatening emergency, suicidal or homicidal thoughts you must seek medical attention immediately by calling 911 or calling your MD immediately  if symptoms less severe.  You Must read complete instructions/literature along with all the possible adverse reactions/side effects for all the Medicines you take and that have been prescribed to you. Take any new Medicines after you have completely understood and accpet all the possible adverse reactions/side effects.   Please note  You were cared for by a hospitalist during your hospital stay. If you have any questions about your discharge medications or the care you received while you were in the  hospital after you are discharged, you can call the unit and asked to speak with the hospitalist on call if the hospitalist that took care of you is not available. Once you are discharged, your primary care physician will handle any further medical issues. Please note that NO REFILLS for any discharge medications will be authorized once you are discharged, as it is imperative that you return to your primary care physician (or establish a relationship with a primary care physician if you do not have one) for your aftercare needs so that they can reassess your need for medications and monitor your lab values.    On the day of Discharge:  VITAL SIGNS:  Blood pressure (!) 161/70, pulse 81, temperature (!) 96.8 F (36 C), temperature source Oral, resp. rate (!) 23, height 5\' 6"  (1.676 m), weight 168 lb 10.4 oz (76.5 kg), SpO2 94 %. PHYSICAL EXAMINATION:  GENERAL:  72 y.o.-year-old patient lying in the bed with no acute distress.  EYES: Pupils equal, round, reactive to light and accommodation. No scleral icterus. Extraocular muscles intact.  HEENT: Head atraumatic, normocephalic. Oropharynx and nasopharynx clear.  NECK:  Supple, no jugular venous distention. No thyroid enlargement, no tenderness.  LUNGS: Coarse breath sounds bilaterally, no wheezing, rales,rhonchi or crepitation. No use of accessory muscles of respiration.  CARDIOVASCULAR: S1, S2 normal. No murmurs, rubs, or gallops.  ABDOMEN: Soft, non-tender, non-distended. Bowel sounds present. No organomegaly or mass.  EXTREMITIES: No pedal edema, cyanosis, or clubbing.  NEUROLOGIC: Cranial nerves II through XII are intact. Muscle strength 5/5 in all extremities. Sensation intact. Gait not checked.  PSYCHIATRIC: The patient is alert and oriented x 3.  SKIN: No obvious rash, lesion, or ulcer.  DATA REVIEW:   CBC Recent Labs  Lab 02/06/18 0244  WBC 5.2  HGB 14.5  HCT 42.9  PLT 204    Chemistries  Recent Labs  Lab 02/03/18 0950   02/05/18 2106 02/06/18 0244  NA 137   < >  --  139  K 4.3   < >  --  5.0  CL 105   < >  --  101  CO2 22   < >  --  30  GLUCOSE 167*   < >  --  306*  BUN 22*   < >  --  30*  CREATININE 0.84   < >  --  0.92  CALCIUM 8.7*   < >  --  8.5*  MG  --   --  2.0  --   AST 54*  --   --   --   ALT 25  --   --   --   ALKPHOS 45  --   --   --   BILITOT 0.9  --   --   --    < > = values in this interval not displayed.     Microbiology Results  No results found for this or any previous visit.  RADIOLOGY:  Dg Chest 2 View  Result Date: 02/05/2018 CLINICAL DATA:  Shortness of breath. EXAM: CHEST  2 VIEW COMPARISON:  Radiographs of February 03, 2018. FINDINGS: The heart size and mediastinal contours are within normal limits. Atherosclerosis of thoracic aorta is noted. Increased bibasilar interstitial densities are noted concerning for pulmonary edema. Minimal bilateral pleural effusions are noted. No pneumothorax is noted. The visualized skeletal structures are unremarkable. IMPRESSION: Increased bilateral pulmonary edema. Minimal bilateral pleural effusions. Aortic Atherosclerosis (ICD10-I70.0). Electronically Signed   By: Lupita Raider, M.D.   On: 02/05/2018 17:44     Management plans discussed with the patient, family and they are in agreement.  CODE STATUS: Full Code   TOTAL TIME TAKING CARE OF THIS PATIENT: 42 minutes.    Shaune Pollack M.D on 02/06/2018 at 1:47 PM  Between 7am to 6pm - Pager - [phone]  After 6pm go to [email or web address] - Social research officer, government  [facility] [place] [facility]  Office  [phone]  CC: Primary care physician; System, Pcp Not In   Note: This dictation was prepared with Dragon dictation along with smaller phrase technology. Any transcriptional errors that result from this process are unintentional.

## 2018-02-06 NOTE — Progress Notes (Signed)
Inpatient Diabetes Program Recommendations  AACE/ADA: New Consensus Statement on Inpatient Glycemic Control (2015)  Target Ranges:  Prepandial:   less than 140 mg/dL      Peak postprandial:   less than 180 mg/dL (1-2 hours)      Critically ill patients:  140 - 180 mg/dL   Lab Results  Component Value Date   GLUCAP 199 (H) 02/06/2018    Review of Glycemic Control  Results for AREND, HARWELL (MRN [id number]) as of 02/06/2018 10:43  Ref. Range 02/06/2018 07:58  Glucose-Capillary Latest Ref Range: 65 - 99 mg/dL 546 (H)   Results for DONALDO, STEGMAN (MRN [id number]) as of 02/06/2018 10:43  Ref. Range 02/03/2018 12:16 02/05/2018 15:55 02/05/2018 21:06 02/06/2018 02:44 02/06/2018 08:48  Glucose Latest Ref Range: 65 - 99 mg/dL  818 (H)  299 (H)    Diabetes history: Type 2 Outpatient Diabetes medications: Metformin 500mg  qam  Current orders for Inpatient glycemic control: Novolog 0-15 units tid, Novolog 0-5 units qhs  * prednisone 60mg  IV q12h  Inpatient Diabetes Program Recommendations: noted- Novolog ordered to begin today.   Based on inpatient lab glucose consider low dose basal insulin while patient is on steroids- consider Lantus 8 units qhs (0.1unit/kg)  Susette Racer, RN, [place], [place], CDE Diabetes Coordinator Inpatient Diabetes Program  [phone] (Team Pager) [phone] [facility] Office) 02/06/2018 10:48 AM

## 2018-02-06 NOTE — Progress Notes (Signed)
*  PRELIMINARY RESULTS* Echocardiogram 2D Echocardiogram has been performed.  Harold Parker 02/06/2018, 9:56 AM

## 2018-02-06 NOTE — Plan of Care (Signed)
Patient was admitted on 2/20 at 2000. No complaints of pain. Patient is short of breath at intervals. Patient profile completed by admission nurse. Significant other is at the bedside. Will continue to monitor and assess.

## 2018-02-06 NOTE — Progress Notes (Signed)
ANTICOAGULATION CONSULT NOTE  Pharmacy Consult for Heparin drip Indication: chest pain/ACS  No Known Allergies  Patient Measurements: Height: 5\' 6"  (167.6 cm) Weight: 168 lb 11.2 oz (76.5 kg) IBW/kg (Calculated) : 63.8 Heparin Dosing Weight: 76  Vital Signs: Temp: 98.1 F (36.7 C) (02/21 0818) Temp Source: Oral (02/21 0818) BP: 154/55 (02/21 0818) Pulse Rate: 71 (02/21 0821)  Labs: Recent Labs    02/03/18 0950 02/05/18 1555 02/05/18 2106 02/06/18 0244  HGB 15.2 15.4  --  14.5  HCT 44.4 45.5  --  42.9  PLT 200 205  --  204  CREATININE 0.84 0.77  --  0.92  TROPONINI  --  0.36* 1.74* 2.65*    Estimated Creatinine Clearance: 64.5 mL/min (by C-G formula based on SCr of 0.92 mg/dL).   Medical History: Past Medical History:  Diagnosis Date  . CAD (coronary artery disease)   . Carotid arterial disease (HCC)   . COPD (chronic obstructive pulmonary disease) (HCC)   . Diabetes mellitus without complication (HCC)   . HTN (hypertension)   . Hypercholesteremia     Assessment: 72 y/o M admitted with AECOPD now with elevated troponin being worked up for possible ACS. Patient received Lovenox 40 mg last PM and is to begin heparin infusion this AM.    Goal of Therapy:  Heparin level 0.3-0.7 units/ml Monitor platelets by anticoagulation protocol: Yes   Plan:  Give 3000 units bolus x 1 (reduced bolus of 40 units/kg due to recent Lovenox administration) Start heparin infusion at 900 units/hr (12 units/kg/hr) Check anti-Xa level in 8 hours and daily while on heparin Continue to monitor H&H and platelets  Luisa Hart D 02/06/2018,8:39 AM

## 2018-02-06 NOTE — Progress Notes (Signed)
Patient requested .5mg  Xanax for bedtime. Notified Dr.willis, received orders.

## 2018-02-06 NOTE — H&P (Signed)
History & Physical    Patient ID: Daksh Stockley MRN: [id number], DOB/AGE: [birth year]   Admit date: (Not on file)   Primary Physician: System, Pcp Not In Primary Cardiologist: Dr. Army Chaco - [place] [phone]  Patient Profile    Zolton Chalifour is a 72 y.o. male with a history of CAD, HTN, HL, DMII, Tob abuse, and COPD, who is being seen today for the evaluation of NSTEMI at the request of Dr. Imogene Burn.  Past Medical History    Past Medical History:  Diagnosis Date  . Bilateral carotid bruits   . CAD (coronary artery disease)    a. 1998 s/p MI and BMS [facility], [place]); b. 1999 redo PCI/rotablator in setting of what sounds like ISR;  c. Multiple stress tests over the years - last ~ 2017, reportedly nl; d. 01/2018 NSTEMI/Cath: LM 15m/d, LAD 50p, 40p/m, D1 60ost, OM1 95, RCA 100ost/p w/ L->R collats, EF 45%.  Marland Kitchen COPD (chronic obstructive pulmonary disease) (HCC)   . Diabetes mellitus without complication (HCC)   . HTN (hypertension)   . Hypercholesteremia   . Ischemic cardiomyopathy    a. 01/2018 Echo: EF 40-45%, mid-apicalanteroseptal, ant, apical sev HK, mod apicalinferior HK. Gr2 DD. Mod AS, mild MR, mod dil LA, PASP .  . Moderate aortic stenosis    a. 01/2018 Echo: Mod AS, mean grad (S) , Valve area (VTI) 1.06 cm^2, (Vmax) 1.27 cm^2.  . Tobacco abuse     Past Surgical History:  Procedure Laterality Date  . CORONARY ANGIOPLASTY    . CORONARY STENT INTERVENTION       Allergies  No Known Allergies  History of Present Illness    72 y/o ? with a h/o CAD s/p MI in ~ 1998 with bare metal stenting.  Within a few months, he had recurrent c/p and required repeat intervention, possibly rotablator based on his description.  Other hx includes HTN, HL, DMII, Tob Abuse, and COPD.  He has regular cardiology f/u in [place]. [place], [place], and says that he has had multiple stress tests over the years - the last of which taking place about 2 yrs ago, which was normal.  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  In that setting, he is reasonably active, but does not routinely exercise.  He has been doing well over the past few years w/o any significant symptoms or limitations.  He still lives in [place] but his girlfriend is in the [place], [place] area.  He drove down last weekend and on 2/16, he felt a little worn out, with low-grade fever, dyspnea, cough, and wheezing.  He was seen @ a local urgent care and was given inhalers and prednisone.  Symptoms didn't really improve, so on Monday, 2/18, he presented to the [facility] ED for evaluation of both c/p and sob.  ECG non-acute.  CXR w/ mild diffuse pulmonary interstitial opacity-question chronic.  He tested + for influenza A.  He was prescribed Tamiflu, Tussionex, and DuoNeb nebulizer and discharged home.  He says he never filled the Tamiflu but continued on previously prescribed prednisone as well as nebulizer and inhaler therapy and started feeling better over the course of this week.  He felt nearly back to baseline on the morning of February 20, but then around 2 PM or so, he had sudden onset of profound dyspnea.  He tried using his nebulizer without relief and then called EMS.  He says that upon EMS arrival, his  blood pressure was well over 200 systolic.  He was placed on oxygen and he says he began to feel little bit better once he was in the ambulance.  At no point did he have chest pain.  On arrival to the ER, blood pressure was 180/84.  ECG was notable for sinus tachycardia with subtle ST depression in V5 and V6.  Chest x-ray showed pulmonary edema and troponin was elevated at 0.36.  He was treated with IV Lasix, IV Solu-Medrol, nebulizers, beta-blocker, and calcium channel blocker.  He had improvement in dyspnea and was admitted for further evaluation.  Troponin has rose to 2.65 this morning.  Catheterization was performed earlier today and revealed severe distal LM stenosis along with  significant OM2 dzs, and a CTO of the RCA with L  R collaterals.  Echocardiogram has shown mild LV dysfxn, with an EF of 40-45% with moderate Ao stenosis.  He has been chest pain and dyspnea free today.  He will be tx to [facility] for CT surgical eval.  Home Medications    Prior to Admission medications   Medication Sig Start Date End Date Taking? Authorizing Provider  amLODipine (NORVASC) 2.5 MG tablet Take 2.5 mg by mouth 2 (two) times daily.    [provider]  Ascorbic Acid (VITAMIN C) 1000 MG tablet Take 1,000 mg by mouth daily.    [provider]  aspirin 325 MG EC tablet Take 325 mg by mouth daily.    [provider]  atenolol (TENORMIN) 50 MG tablet Take 25-50 mg by mouth 2 (two) times daily. Take 50mg  by mouth every morning and 25mg  by mouth every evening    [provider]  atorvastatin (LIPITOR) 40 MG tablet Take 1 tablet (40 mg total) by mouth daily at 6 PM. 02/06/18   Shaune Pollack, MD  chlorpheniramine-HYDROcodone [facility]) 10-8 MG/5ML SUER Take 5 mLs by mouth 2 (two) times daily. 02/03/18   Emily Filbert, MD  ezetimibe (ZETIA) 10 MG tablet Take 10 mg by mouth daily.    [provider]  folic acid (FOLVITE) 400 MCG tablet Take 400 mcg by mouth every evening.    [provider]  heparin 100-0.45 UNIT/ML-% infusion Inject 900 Units/hr into the vein continuous. 02/06/18   Shaune Pollack, MD  ipratropium-albuterol (DUONEB) 0.5-2.5 (3) MG/3ML SOLN Take 3 mLs by nebulization every 6 (six) hours as needed. 02/03/18   Emily Filbert, MD  isosorbide mononitrate (IMDUR) 60 MG 24 hr tablet Take 60 mg by mouth daily.    [provider]  metFORMIN (GLUCOPHAGE) 500 MG tablet Take 500 mg by mouth every morning.    [provider]  Nutritional Supplements (GRAPESEED EXTRACT) 500-50 MG CAPS Take 1 tablet by mouth 2 (two) times daily.    [provider]  oseltamivir (TAMIFLU) 75 MG capsule Take 1 capsule (75  mg total) by mouth 2 (two) times daily for 5 days. 02/03/18 02/08/18  Emily Filbert, MD  Resveratrol 250 MG CAPS Take 500 mg by mouth 2 (two) times daily.    [provider]  simvastatin (ZOCOR) 20 MG tablet Take 20 mg by mouth every evening.    [provider]  VITAMIN D, CHOLECALCIFEROL, PO Take by mouth every morning.    [provider]    Family History    Family History  Problem Relation Age of Onset  . Lymphoma Mother   . Peripheral vascular disease Father    indicated that his mother is  deceased. He indicated that his father is deceased.   Social History    Social History   Socioeconomic History  . Marital status: Widowed    Spouse name: Not on file  . Number of children: Not on file  . Years of education: Not on file  . Highest education level: Not on file  Social Needs  . Financial resource strain: Not on file  . Food insecurity - worry: Not on file  . Food insecurity - inability: Not on file  . Transportation needs - medical: Not on file  . Transportation needs - non-medical: Not on file  Occupational History  . Not on file  Tobacco Use  . Smoking status: Current Every Day Smoker    Packs/day: 1.00    Years: 15.00    Pack years: 15.00    Types: Cigarettes    Last attempt to quit: 02/02/2018    Years since quitting: 0.0  . Smokeless tobacco: Never Used  Substance and Sexual Activity  . Alcohol use: No    Frequency: Never  . Drug use: No  . Sexual activity: Yes  Other Topics Concern  . Not on file  Social History Narrative   Lives in [place] by himself.  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  Fairly active but doesn't routinely exercise.     Review of Systems    General:  No chills, +++ low grade fever with weakness and malaise starting 2/16.  No night sweats or weight changes.  Cardiovascular:  No chest pain, +++ dyspnea, no edema, orthopnea, palpitations, paroxysmal nocturnal  dyspnea. Dermatological: No rash, lesions/masses Respiratory: +++ cough, +++ dyspnea, +++ wheezing Urologic: No hematuria, dysuria Abdominal:   No nausea, vomiting, diarrhea, bright red blood per rectum, melena, or hematemesis Neurologic:  No visual changes, wkns, changes in mental status. All other systems reviewed and are otherwise negative except as noted above.  Physical Exam     96.8, 78, 20, 166/62, 99%, 2lpm. General: Pleasant, NAD Psych: Normal affect. Neuro: Alert and oriented X 3. Moves all extremities spontaneously. HEENT: Normal  Neck: Supple. No jvd. bilat bruits vs radiated murmur. Lungs:  Resp regular and unlabored, coarse breath sounds bilat. Heart: RRR, 2/6 SEM @ USB, no s3, s4, or murmurs. Abdomen: Soft, non-tender, non-distended, BS + x 4.  Extremities: No clubbing, cyanosis or edema. DP/PT/Radials 2+ and equal bilaterally.  Labs     Recent Labs    02/05/18 1555 02/05/18 2106 02/06/18 0244  TROPONINI 0.36* 1.74* 2.65*   Lab Results  Component Value Date   WBC 5.2 02/06/2018   HGB 14.5 02/06/2018   HCT 42.9 02/06/2018   MCV 90.3 02/06/2018   PLT 204 02/06/2018    Recent Labs  Lab 02/03/18 0950  02/06/18 0244  NA 137   < > 139  K 4.3   < > 5.0  CL 105   < > 101  CO2 22   < > 30  BUN 22*   < > 30*  CREATININE 0.84   < > 0.92  CALCIUM 8.7*   < > 8.5*  PROT 7.1  --   --   BILITOT 0.9  --   --   ALKPHOS 45  --   --   ALT 25  --   --   AST 54*  --   --   GLUCOSE 167*   < > 306*   < > = values in this interval not displayed.   Lab Results  Component Value Date   CHOL 139 02/06/2018   HDL 42 02/06/2018   LDLCALC 74 02/06/2018   TRIG 116 02/06/2018     Radiology Studies    Dg Chest 2 View  Result Date: 02/05/2018 CLINICAL DATA:  Shortness of breath. EXAM: CHEST  2 VIEW COMPARISON:  Radiographs of February 03, 2018. FINDINGS: The heart size and mediastinal contours are within normal limits. Atherosclerosis of thoracic aorta is noted.  Increased bibasilar interstitial densities are noted concerning for pulmonary edema. Minimal bilateral pleural effusions are noted. No pneumothorax is noted. The visualized skeletal structures are unremarkable. IMPRESSION: Increased bilateral pulmonary edema. Minimal bilateral pleural effusions. Aortic Atherosclerosis (ICD10-I70.0). Electronically Signed   By: Lupita Raider, M.D.   On: 02/05/2018 17:44   Dg Chest 2 View  Result Date: 02/03/2018 CLINICAL DATA:  72 year old male with shortness of breath and chest pain. Symptoms not improved despite recently starting prednisone and antibiotics. Smoker. EXAM: CHEST  2 VIEW COMPARISON:  None. FINDINGS: No prior study for comparison. Large lung volumes. Normal cardiac size and mediastinal contours. Visualized tracheal air column is within normal limits. No pneumothorax, pulmonary edema, pleural effusion or confluent pulmonary opacity. Mild diffuse increased pulmonary interstitial markings are age indeterminate, probably chronic. No acute osseous abnormality identified. Negative visible bowel gas pattern. IMPRESSION: 1. Pulmonary hyperinflation. Mild diffuse pulmonary interstitial opacity, probably chronic and smoking related, but consider also viral/atypical respiratory infection. 2. No pleural effusion or other acute cardiopulmonary abnormality. Electronically Signed   By: Odessa Fleming M.D.   On: 02/03/2018 10:25    ECG & Cardiac Imaging    Sinus tachycardia, 109, left atrial enlargement, prior inferior infarct, less than 1 mm flat to upsloping ST segment depression in lateral leads which was not present earlier in the week.  Assessment & Plan    1.  Non-STEMI/coronary artery disease: Patient with prior history of CAD status post prior infarct and stenting in approximately 1998 with subsequent repeat procedure a few months later.  ECG suggest prior inferior infarct.  He has undergone stress testing over the years and reportedly had a normal stress test about 2  years ago.  He is followed by cardiology in [place].  He was diagnosed with influenza A on February 18 and had been having dyspnea, wheezing, and general malaise.  Though he was prescribed Tamiflu in the emergency department on the 18th, he had not been taking this.  Symptoms seemed to improve with steroids and nebulizers.  He had acute onset of dyspnea on the afternoon of February 20, prompting him to call EMS.  He was found to be markedly hypertensive and mildly tachycardic.  In the ER, he was noted to have pulmonary edema on his chest x-ray while troponin was mildly elevated at 0.36.  Troponin has since risen to 2.65.  He has not had any chest pain.  He underwent cath earlier today with finding of severe distal LM dzs, OM2 dzs, and CTO of the RCA.  Significantly elevated EDP.  Plan tx to [facility] for CT surgical eval.  He is currently symptoms free.  Resume heparin 6 hrs after sheath pull.  Cont asa, statin,  blocker, ARB.  2.  Essential hypertension/hypertensive urgency: Patient's blood pressure was markedly elevated on arrival.  This was in the setting of acute respiratory failure and pulmonary edema.  He is on beta-blocker, calcium channel blocker, and nitrate therapy chronically and he reports compliance.  Elevated filling pressures on cath  losartan added.  Diurese.  3.  Mod Ao Stenosis:  gradient on cath.  CT surgery to review for possible AVR @ time of CABG if felt to be candidate.  4.  Ischemic cardiomyopathy/Acute systolic CHF:  EF 40-45% by echo.  EDP 40 on cath in setting of AS.  Will diurese.  Cont  blocker and ARB (added today).  Follow renal fxn.  5.  Hyperlipidemia: He is on simvastatin and Zetia at home.  LDL was 74 this morning.  He is on Lipitor here and I will escalate this to 80 mg.  6.  Tobacco abuse: He says he quit 4 days ago when he started having significant wheezing and respiratory failure.  He may require a nicotine patch as he has been smoking a pack a day for many  years.  Complete cessation advised.  7.  COPD: With recent exacerbation.  Cont inhalers and nebs.  Will wean off of steroids as dyspnea appears to be more related to elevated filling pressures.  8.  Influenza A: He tested positive on February 18.  He says he has not been taking Tamiflu but seems to be feeling better.  Tamiflu initiated @ [facility].  9.  Bilateral carotid bruits: f/u carotid u/s - ? 2/2 radiated murmur from Ao stenosis.  Signed, Nicolasa Ducking, NP 02/06/2018, 3:54 PM

## 2018-02-07 ENCOUNTER — Encounter: Payer: Self-pay | Admitting: Cardiovascular Disease

## 2018-02-07 ENCOUNTER — Inpatient Hospital Stay (HOSPITAL_COMMUNITY)
Admission: AD | Admit: 2018-02-07 | Discharge: 2018-02-26 | DRG: 219 | Disposition: A | Discharge status: Home Health | Payer: Medicare Other | Source: Other Acute Inpatient Hospital | Attending: Thoracic Surgery (Cardiothoracic Vascular Surgery) | Admitting: Thoracic Surgery (Cardiothoracic Vascular Surgery)

## 2018-02-07 ENCOUNTER — Inpatient Hospital Stay: Payer: Medicare Other

## 2018-02-07 ENCOUNTER — Other Ambulatory Visit: Payer: Self-pay

## 2018-02-07 ENCOUNTER — Other Ambulatory Visit: Payer: Self-pay | Admitting: *Deleted

## 2018-02-07 ENCOUNTER — Encounter (HOSPITAL_COMMUNITY): Payer: Self-pay | Admitting: General Practice

## 2018-02-07 DIAGNOSIS — I252 Old myocardial infarction: Secondary | ICD-10-CM

## 2018-02-07 DIAGNOSIS — I5041 Acute combined systolic (congestive) and diastolic (congestive) heart failure: Secondary | ICD-10-CM | POA: Diagnosis not present

## 2018-02-07 DIAGNOSIS — J111 Influenza due to unidentified influenza virus with other respiratory manifestations: Secondary | ICD-10-CM | POA: Diagnosis not present

## 2018-02-07 DIAGNOSIS — F17210 Nicotine dependence, cigarettes, uncomplicated: Secondary | ICD-10-CM | POA: Diagnosis present

## 2018-02-07 DIAGNOSIS — J449 Chronic obstructive pulmonary disease, unspecified: Secondary | ICD-10-CM | POA: Diagnosis not present

## 2018-02-07 DIAGNOSIS — I350 Nonrheumatic aortic (valve) stenosis: Principal | ICD-10-CM

## 2018-02-07 DIAGNOSIS — I428 Other cardiomyopathies: Secondary | ICD-10-CM | POA: Diagnosis not present

## 2018-02-07 DIAGNOSIS — Z7952 Long term (current) use of systemic steroids: Secondary | ICD-10-CM

## 2018-02-07 DIAGNOSIS — I2510 Atherosclerotic heart disease of native coronary artery without angina pectoris: Secondary | ICD-10-CM | POA: Diagnosis present

## 2018-02-07 DIAGNOSIS — I509 Heart failure, unspecified: Secondary | ICD-10-CM

## 2018-02-07 DIAGNOSIS — Z01811 Encounter for preprocedural respiratory examination: Secondary | ICD-10-CM

## 2018-02-07 DIAGNOSIS — J441 Chronic obstructive pulmonary disease with (acute) exacerbation: Secondary | ICD-10-CM | POA: Diagnosis present

## 2018-02-07 DIAGNOSIS — E785 Hyperlipidemia, unspecified: Secondary | ICD-10-CM | POA: Diagnosis present

## 2018-02-07 DIAGNOSIS — I255 Ischemic cardiomyopathy: Secondary | ICD-10-CM | POA: Diagnosis present

## 2018-02-07 DIAGNOSIS — E1159 Type 2 diabetes mellitus with other circulatory complications: Secondary | ICD-10-CM | POA: Diagnosis not present

## 2018-02-07 DIAGNOSIS — E1169 Type 2 diabetes mellitus with other specified complication: Secondary | ICD-10-CM | POA: Diagnosis present

## 2018-02-07 DIAGNOSIS — Z720 Tobacco use: Secondary | ICD-10-CM | POA: Diagnosis not present

## 2018-02-07 DIAGNOSIS — Z953 Presence of xenogenic heart valve: Secondary | ICD-10-CM | POA: Diagnosis not present

## 2018-02-07 DIAGNOSIS — I25118 Atherosclerotic heart disease of native coronary artery with other forms of angina pectoris: Secondary | ICD-10-CM | POA: Diagnosis not present

## 2018-02-07 DIAGNOSIS — J9811 Atelectasis: Secondary | ICD-10-CM | POA: Diagnosis not present

## 2018-02-07 DIAGNOSIS — I110 Hypertensive heart disease with heart failure: Secondary | ICD-10-CM | POA: Diagnosis not present

## 2018-02-07 DIAGNOSIS — E1165 Type 2 diabetes mellitus with hyperglycemia: Secondary | ICD-10-CM

## 2018-02-07 DIAGNOSIS — J811 Chronic pulmonary edema: Secondary | ICD-10-CM | POA: Diagnosis not present

## 2018-02-07 DIAGNOSIS — R0602 Shortness of breath: Secondary | ICD-10-CM

## 2018-02-07 DIAGNOSIS — I358 Other nonrheumatic aortic valve disorders: Secondary | ICD-10-CM | POA: Diagnosis not present

## 2018-02-07 DIAGNOSIS — M545 Low back pain: Secondary | ICD-10-CM | POA: Diagnosis present

## 2018-02-07 DIAGNOSIS — G8929 Other chronic pain: Secondary | ICD-10-CM | POA: Diagnosis present

## 2018-02-07 DIAGNOSIS — J1000 Influenza due to other identified influenza virus with unspecified type of pneumonia: Secondary | ICD-10-CM | POA: Diagnosis not present

## 2018-02-07 DIAGNOSIS — E119 Type 2 diabetes mellitus without complications: Secondary | ICD-10-CM | POA: Diagnosis not present

## 2018-02-07 DIAGNOSIS — Z955 Presence of coronary angioplasty implant and graft: Secondary | ICD-10-CM

## 2018-02-07 DIAGNOSIS — Z951 Presence of aortocoronary bypass graft: Secondary | ICD-10-CM

## 2018-02-07 DIAGNOSIS — E1129 Type 2 diabetes mellitus with other diabetic kidney complication: Secondary | ICD-10-CM

## 2018-02-07 DIAGNOSIS — J9621 Acute and chronic respiratory failure with hypoxia: Secondary | ICD-10-CM

## 2018-02-07 DIAGNOSIS — R748 Abnormal levels of other serum enzymes: Secondary | ICD-10-CM | POA: Diagnosis not present

## 2018-02-07 DIAGNOSIS — J9601 Acute respiratory failure with hypoxia: Secondary | ICD-10-CM | POA: Diagnosis present

## 2018-02-07 DIAGNOSIS — I5021 Acute systolic (congestive) heart failure: Secondary | ICD-10-CM | POA: Diagnosis not present

## 2018-02-07 DIAGNOSIS — Z01810 Encounter for preprocedural cardiovascular examination: Secondary | ICD-10-CM | POA: Diagnosis not present

## 2018-02-07 DIAGNOSIS — J90 Pleural effusion, not elsewhere classified: Secondary | ICD-10-CM

## 2018-02-07 DIAGNOSIS — I214 Non-ST elevation (NSTEMI) myocardial infarction: Secondary | ICD-10-CM | POA: Diagnosis not present

## 2018-02-07 DIAGNOSIS — J440 Chronic obstructive pulmonary disease with acute lower respiratory infection: Secondary | ICD-10-CM | POA: Diagnosis present

## 2018-02-07 DIAGNOSIS — I200 Unstable angina: Secondary | ICD-10-CM | POA: Diagnosis not present

## 2018-02-07 DIAGNOSIS — I2583 Coronary atherosclerosis due to lipid rich plaque: Secondary | ICD-10-CM | POA: Diagnosis not present

## 2018-02-07 DIAGNOSIS — Z09 Encounter for follow-up examination after completed treatment for conditions other than malignant neoplasm: Secondary | ICD-10-CM

## 2018-02-07 DIAGNOSIS — I6523 Occlusion and stenosis of bilateral carotid arteries: Secondary | ICD-10-CM | POA: Diagnosis not present

## 2018-02-07 DIAGNOSIS — R05 Cough: Secondary | ICD-10-CM | POA: Diagnosis not present

## 2018-02-07 DIAGNOSIS — I081 Rheumatic disorders of both mitral and tricuspid valves: Secondary | ICD-10-CM | POA: Diagnosis not present

## 2018-02-07 DIAGNOSIS — J810 Acute pulmonary edema: Secondary | ICD-10-CM | POA: Diagnosis not present

## 2018-02-07 DIAGNOSIS — E7800 Pure hypercholesterolemia, unspecified: Secondary | ICD-10-CM | POA: Diagnosis present

## 2018-02-07 DIAGNOSIS — J101 Influenza due to other identified influenza virus with other respiratory manifestations: Secondary | ICD-10-CM | POA: Diagnosis not present

## 2018-02-07 HISTORY — DX: Low back pain: M54.5

## 2018-02-07 HISTORY — DX: Presence of xenogenic heart valve: Z95.3

## 2018-02-07 HISTORY — DX: Acute myocardial infarction, unspecified: I21.9

## 2018-02-07 HISTORY — DX: Type 2 diabetes mellitus without complications: E11.9

## 2018-02-07 HISTORY — DX: Presence of aortocoronary bypass graft: Z95.1

## 2018-02-07 HISTORY — DX: Low back pain, unspecified: M54.50

## 2018-02-07 HISTORY — DX: Other chronic pain: G89.29

## 2018-02-07 LAB — SURGICAL PCR SCREEN
MRSA, PCR: NEGATIVE
Staphylococcus aureus: POSITIVE — AB

## 2018-02-07 LAB — BASIC METABOLIC PANEL
Anion gap: 8 (ref 5–15)
BUN: 30 mg/dL — ABNORMAL HIGH (ref 6–20)
CO2: 29 mmol/L (ref 22–32)
Calcium: 8.5 mg/dL — ABNORMAL LOW (ref 8.9–10.3)
Chloride: 102 mmol/L (ref 101–111)
Creatinine, Ser: 0.91 mg/dL (ref 0.61–1.24)
GFR calc Af Amer: >60 mL/min (ref >60)
GFR calc non Af Amer: >60 mL/min (ref >60)
Glucose, Bld: 217 mg/dL — ABNORMAL HIGH (ref 65–99)
Potassium: 4.2 mmol/L (ref 3.5–5.1)
Sodium: 139 mmol/L (ref 135–145)

## 2018-02-07 LAB — CBC
HCT: 44.6 % (ref 40.0–52.0)
Hemoglobin: 15.6 g/dL (ref 13.0–18.0)
MCH: 31 pg (ref 26.0–34.0)
MCHC: 34.9 g/dL (ref 32.0–36.0)
MCV: 88.8 fL (ref 80.0–100.0)
Platelets: 205 10*3/uL (ref 150–440)
RBC: 5.03 MIL/uL (ref 4.40–5.90)
RDW: 13.5 % (ref 11.5–14.5)
WBC: 9 10*3/uL (ref 3.8–10.6)

## 2018-02-07 LAB — GLUCOSE, CAPILLARY
Glucose-Capillary: 214 mg/dL — ABNORMAL HIGH (ref 65–99)
Glucose-Capillary: 228 mg/dL — ABNORMAL HIGH (ref 65–99)
Glucose-Capillary: 190 mg/dL — ABNORMAL HIGH (ref 65–99)
Glucose-Capillary: 185 mg/dL — ABNORMAL HIGH (ref 65–99)

## 2018-02-07 LAB — HEPARIN LEVEL (UNFRACTIONATED)
Heparin Unfractionated: <0.1 IU/mL — ABNORMAL LOW (ref 0.30–0.70)
Heparin Unfractionated: 0.13 IU/mL — ABNORMAL LOW (ref 0.30–0.70)

## 2018-02-07 MED ORDER — ASPIRIN EC 81 MG PO TBEC
81.0000 mg | DELAYED_RELEASE_TABLET | Freq: Every day | ORAL | Status: DC
  Administered 2018-02-08 – 2018-02-18 (×11): 81 mg via ORAL
  Filled 2018-02-07 (×11): qty 1

## 2018-02-07 MED ORDER — AMLODIPINE BESYLATE 5 MG PO TABS: 5.0000 mg | ORAL_TABLET | Freq: Every day | ORAL | Status: DC

## 2018-02-07 MED ORDER — NITROGLYCERIN 0.4 MG SL SUBL: 0.4000 mg | SUBLINGUAL_TABLET | SUBLINGUAL | Status: DC | PRN

## 2018-02-07 MED ORDER — LOSARTAN POTASSIUM 50 MG PO TABS
100.0000 mg | ORAL_TABLET | Freq: Every day | ORAL | Status: DC
  Administered 2018-02-07: 100 mg via ORAL
  Filled 2018-02-07: qty 2

## 2018-02-07 MED ORDER — PREDNISONE 50 MG PO TABS
50.0000 mg | ORAL_TABLET | Freq: Every day | ORAL | Status: DC
  Administered 2018-02-07: 50 mg via ORAL
  Filled 2018-02-07: qty 1

## 2018-02-07 MED ORDER — ONDANSETRON HCL 4 MG/2ML IJ SOLN: 4.0000 mg | Freq: Four times a day (QID) | INTRAMUSCULAR | Status: DC | PRN

## 2018-02-07 MED ORDER — IPRATROPIUM-ALBUTEROL 0.5-2.5 (3) MG/3ML IN SOLN
3.0000 mL | Freq: Four times a day (QID) | RESPIRATORY_TRACT | Status: DC | PRN
  Administered 2018-02-08 – 2018-02-14 (×10): 3 mL via RESPIRATORY_TRACT
  Filled 2018-02-07 (×11): qty 3

## 2018-02-07 MED ORDER — PREDNISONE 20 MG PO TABS
40.0000 mg | ORAL_TABLET | Freq: Once | ORAL | Status: AC
  Administered 2018-02-08: 40 mg via ORAL
  Filled 2018-02-07: qty 2

## 2018-02-07 MED ORDER — FUROSEMIDE 10 MG/ML IJ SOLN: 20.0000 mg | Freq: Two times a day (BID) | INTRAMUSCULAR | 0 refills | Status: DC

## 2018-02-07 MED ORDER — METOPROLOL TARTRATE 25 MG PO TABS
25.0000 mg | ORAL_TABLET | Freq: Two times a day (BID) | ORAL | Status: DC
Start: 2018-02-07 — End: 2018-02-19
  Administered 2018-02-07 – 2018-02-18 (×22): 25 mg via ORAL
  Filled 2018-02-07 (×23): qty 1

## 2018-02-07 MED ORDER — LOSARTAN POTASSIUM 100 MG PO TABS: 100.0000 mg | ORAL_TABLET | Freq: Every day | ORAL | Status: DC

## 2018-02-07 MED ORDER — FUROSEMIDE 10 MG/ML IJ SOLN
20.0000 mg | Freq: Two times a day (BID) | INTRAMUSCULAR | Status: DC
  Administered 2018-02-07 – 2018-02-18 (×23): 20 mg via INTRAVENOUS
  Filled 2018-02-07 (×23): qty 2

## 2018-02-07 MED ORDER — OSELTAMIVIR PHOSPHATE 75 MG PO CAPS
75.0000 mg | ORAL_CAPSULE | Freq: Two times a day (BID) | ORAL | Status: DC
  Administered 2018-02-07 – 2018-02-11 (×8): 75 mg via ORAL
  Filled 2018-02-07 (×8): qty 1

## 2018-02-07 MED ORDER — ALPRAZOLAM 0.5 MG PO TABS
0.5000 mg | ORAL_TABLET | Freq: Once | ORAL | Status: AC | PRN
  Administered 2018-02-07: 0.5 mg via ORAL
  Filled 2018-02-07: qty 1

## 2018-02-07 MED ORDER — ACETAMINOPHEN 325 MG PO TABS: 650.0000 mg | ORAL_TABLET | ORAL | Status: DC | PRN

## 2018-02-07 MED ORDER — EZETIMIBE 10 MG PO TABS
10.0000 mg | ORAL_TABLET | Freq: Every day | ORAL | Status: DC
  Administered 2018-02-07 – 2018-02-18 (×12): 10 mg via ORAL
  Filled 2018-02-07 (×12): qty 1

## 2018-02-07 MED ORDER — HEPARIN BOLUS VIA INFUSION
2300.0000 [IU] | Freq: Once | INTRAVENOUS | Status: AC
Start: 2018-02-07 — End: 2018-02-07
  Administered 2018-02-07: 2300 [IU] via INTRAVENOUS
  Filled 2018-02-07: qty 2300

## 2018-02-07 MED ORDER — HEPARIN (PORCINE) IN NACL 100-0.45 UNIT/ML-% IJ SOLN: 1200.0000 [IU]/h | INTRAMUSCULAR | Status: DC

## 2018-02-07 MED ORDER — INSULIN ASPART 100 UNIT/ML ~~LOC~~ SOLN
0.0000 [IU] | Freq: Three times a day (TID) | SUBCUTANEOUS | Status: DC
  Administered 2018-02-07: 3 [IU] via SUBCUTANEOUS
  Administered 2018-02-08: 2 [IU] via SUBCUTANEOUS
  Administered 2018-02-08: 5 [IU] via SUBCUTANEOUS
  Administered 2018-02-08: 3 [IU] via SUBCUTANEOUS
  Administered 2018-02-09: 11 [IU] via SUBCUTANEOUS
  Administered 2018-02-09: 3 [IU] via SUBCUTANEOUS
  Administered 2018-02-09: 1 [IU] via SUBCUTANEOUS
  Administered 2018-02-10: 3 [IU] via SUBCUTANEOUS
  Administered 2018-02-10: 5 [IU] via SUBCUTANEOUS
  Administered 2018-02-10: 8 [IU] via SUBCUTANEOUS
  Administered 2018-02-11: 5 [IU] via SUBCUTANEOUS
  Administered 2018-02-11: 3 [IU] via SUBCUTANEOUS
  Administered 2018-02-11: 8 [IU] via SUBCUTANEOUS
  Administered 2018-02-12 – 2018-02-13 (×4): 3 [IU] via SUBCUTANEOUS
  Administered 2018-02-13: 5 [IU] via SUBCUTANEOUS
  Administered 2018-02-13 – 2018-02-14 (×2): 3 [IU] via SUBCUTANEOUS
  Administered 2018-02-14: 5 [IU] via SUBCUTANEOUS
  Administered 2018-02-14: 2 [IU] via SUBCUTANEOUS
  Administered 2018-02-15: 8 [IU] via SUBCUTANEOUS
  Administered 2018-02-15: 3 [IU] via SUBCUTANEOUS
  Administered 2018-02-15: 5 [IU] via SUBCUTANEOUS
  Administered 2018-02-16: 8 [IU] via SUBCUTANEOUS
  Administered 2018-02-16 – 2018-02-17 (×4): 3 [IU] via SUBCUTANEOUS
  Administered 2018-02-17: 15 [IU] via SUBCUTANEOUS
  Administered 2018-02-18 (×2): 3 [IU] via SUBCUTANEOUS
  Administered 2018-02-18: 8 [IU] via SUBCUTANEOUS

## 2018-02-07 MED ORDER — HEPARIN (PORCINE) IN NACL 100-0.45 UNIT/ML-% IJ SOLN
1550.0000 [IU]/h | INTRAMUSCULAR | Status: AC
  Administered 2018-02-07: 1350 [IU]/h via INTRAVENOUS
  Administered 2018-02-09 – 2018-02-10 (×2): 1500 [IU]/h via INTRAVENOUS
  Administered 2018-02-11 – 2018-02-14 (×5): 1600 [IU]/h via INTRAVENOUS
  Administered 2018-02-16: 1550 [IU]/h via INTRAVENOUS
  Administered 2018-02-16: 1600 [IU]/h via INTRAVENOUS
  Administered 2018-02-17: 1550 [IU]/h via INTRAVENOUS
  Filled 2018-02-07 (×16): qty 250

## 2018-02-07 MED ORDER — HEPARIN BOLUS VIA INFUSION
2000.0000 [IU] | Freq: Once | INTRAVENOUS | Status: AC
  Administered 2018-02-07: 2000 [IU] via INTRAVENOUS
  Filled 2018-02-07: qty 2000

## 2018-02-07 MED ORDER — PREDNISONE 10 MG PO TABS
10.0000 mg | ORAL_TABLET | Freq: Once | ORAL | Status: AC
  Administered 2018-02-11: 10 mg via ORAL
  Filled 2018-02-07: qty 1

## 2018-02-07 MED ORDER — SODIUM CHLORIDE 0.9% FLUSH
3.0000 mL | Freq: Two times a day (BID) | INTRAVENOUS | Status: DC
  Administered 2018-02-08 – 2018-02-18 (×18): 3 mL via INTRAVENOUS

## 2018-02-07 MED ORDER — PREDNISONE 20 MG PO TABS
20.0000 mg | ORAL_TABLET | Freq: Once | ORAL | Status: AC
  Administered 2018-02-10: 20 mg via ORAL
  Filled 2018-02-07: qty 1

## 2018-02-07 MED ORDER — LOSARTAN POTASSIUM 50 MG PO TABS
100.0000 mg | ORAL_TABLET | Freq: Every day | ORAL | Status: DC
  Administered 2018-02-08 – 2018-02-18 (×11): 100 mg via ORAL
  Filled 2018-02-07 (×11): qty 2

## 2018-02-07 MED ORDER — PREDNISONE 50 MG PO TABS: ORAL_TABLET | ORAL | Status: DC

## 2018-02-07 MED ORDER — AMLODIPINE BESYLATE 5 MG PO TABS
5.0000 mg | ORAL_TABLET | Freq: Every day | ORAL | Status: DC
  Administered 2018-02-08 – 2018-02-18 (×11): 5 mg via ORAL
  Filled 2018-02-07 (×11): qty 1

## 2018-02-07 MED ORDER — SODIUM CHLORIDE 0.9% FLUSH
3.0000 mL | INTRAVENOUS | Status: DC | PRN
Start: 2018-02-07 — End: 2018-02-19
  Administered 2018-02-12: 3 mL via INTRAVENOUS
  Filled 2018-02-07: qty 3

## 2018-02-07 MED ORDER — PREDNISONE 20 MG PO TABS
30.0000 mg | ORAL_TABLET | Freq: Once | ORAL | Status: AC
  Administered 2018-02-09: 30 mg via ORAL
  Filled 2018-02-07: qty 1

## 2018-02-07 MED ORDER — ISOSORBIDE MONONITRATE ER 60 MG PO TB24
60.0000 mg | ORAL_TABLET | Freq: Every day | ORAL | Status: DC
  Administered 2018-02-07 – 2018-02-18 (×12): 60 mg via ORAL
  Filled 2018-02-07 (×12): qty 1

## 2018-02-07 MED ORDER — SODIUM CHLORIDE 0.9 % IV SOLN: 250.0000 mL | INTRAVENOUS | Status: DC | PRN

## 2018-02-07 MED ORDER — ATORVASTATIN CALCIUM 80 MG PO TABS
80.0000 mg | ORAL_TABLET | Freq: Every day | ORAL | Status: DC
  Administered 2018-02-07 – 2018-02-18 (×12): 80 mg via ORAL
  Filled 2018-02-07 (×12): qty 1

## 2018-02-07 NOTE — Progress Notes (Addendum)
Progress Note  Patient Name: Harold Parker Date of Encounter: 02/07/2018  Primary Cardiologist: Army Chaco, MD - [place] [phone]  Subjective   No chest pain.  Overall breathing improved.  Still coughing some.  Nervous re: pending surgical eval/surgery.  Inpatient Medications    Scheduled Meds: . albuterol  2.5 mg Nebulization Q6H  . aspirin EC  325 mg Oral Daily  . atorvastatin  40 mg Oral q1800  . ezetimibe  10 mg Oral Daily  . furosemide  20 mg Intravenous Q12H  . insulin aspart  0-15 Units Subcutaneous TID WC  . insulin aspart  0-5 Units Subcutaneous QHS  . insulin glargine  8 Units Subcutaneous QHS  . losartan  50 mg Oral Daily  . methylPREDNISolone (SOLU-MEDROL) injection  60 mg Intravenous Q12H  . metoprolol tartrate  25 mg Oral BID  . nicotine  14 mg Transdermal Daily  . oseltamivir  75 mg Oral BID  . sodium chloride flush  3 mL Intravenous Q12H  . sodium chloride flush  3 mL Intravenous Q12H   Continuous Infusions: . sodium chloride    . sodium chloride    . heparin 1,200 Units/hr (02/07/18 0723)   PRN Meds: sodium chloride, sodium chloride, acetaminophen **OR** acetaminophen, albuterol, ALPRAZolam, bisacodyl, guaiFENesin-dextromethorphan, hydrALAZINE, HYDROcodone-acetaminophen, ondansetron **OR** ondansetron (ZOFRAN) IV, senna-docusate, sodium chloride flush, sodium chloride flush   Vital Signs    Vitals:   02/06/18 1734 02/06/18 1954 02/06/18 2011 02/07/18 0441  BP: (!) 156/60 (!) 176/71  (!) 157/56  Pulse: 75 72  67  Resp: 18 18  18   Temp: 97.9 F (36.6 C) 98.4 F (36.9 C)  98.4 F (36.9 C)  TempSrc: Oral Oral  Oral  SpO2: 96% 99% 98% 96%  Weight:    167 lb 11.2 oz (76.1 kg)  Height:        Intake/Output Summary (Last 24 hours) at 02/07/2018 0802 Last data filed at 02/07/2018 0445 Gross per 24 hour  Intake 37.8 ml  Output 1400 ml  Net -1362.2 ml   Filed Weights   02/06/18 0421 02/06/18 0911 02/07/18 0441  Weight: 168 lb 11.2 oz  (76.5 kg) 168 lb 10.4 oz (76.5 kg) 167 lb 11.2 oz (76.1 kg)    Physical Exam   GEN: Well nourished, well developed, in no acute distress.  HEENT: Grossly normal.  Neck: Supple, no JVD. ? bilat carotid bruits vs radiated murmur. No masses. Cardiac: RRR, soft syst murmur @ USB, rubs, or gallops. No clubbing, cyanosis, edema.  Radials/DP/PT 2+ and equal bilaterally. R wrist cath site w/o bleeding/bruit/hematoma. Respiratory:  Respirations regular and unlabored, coarse breath sounds throughout. GI: Soft, nontender, nondistended, BS + x 4. MS: no deformity or atrophy. Skin: warm and dry, no rash. Neuro:  Strength and sensation are intact. Psych: AAOx3.  Normal affect.  Labs    Chemistry Recent Labs  Lab 02/03/18 0950 02/05/18 1555 02/06/18 0244 02/07/18 0419  NA 137 135 139 139  K 4.3 4.5 5.0 4.2  CL 105 101 101 102  CO2 22 22 30 29   GLUCOSE 167* 342* 306* 217*  BUN 22* 34* 30* 30*  CREATININE 0.84 0.77 0.92 0.91  CALCIUM 8.7* 8.3* 8.5* 8.5*  PROT 7.1  --   --   --   ALBUMIN 3.9  --   --   --   AST 54*  --   --   --   ALT 25  --   --   --   [facility]  45  --   --   --   BILITOT 0.9  --   --   --   GFRNONAA >60 >60 >60 >60  GFRAA >60 >60 >60 >60  ANIONGAP 10 12 8 8      Hematology Recent Labs  Lab 02/05/18 1555 02/06/18 0244 02/07/18 0419  WBC 7.7 5.2 9.0  RBC 5.03 4.75 5.03  HGB 15.4 14.5 15.6  HCT 45.5 42.9 44.6  MCV 90.4 90.3 88.8  MCH 30.6 30.5 31.0  MCHC 33.9 33.7 34.9  RDW 14.6* 14.0 13.5  PLT 205 204 205    Cardiac Enzymes Recent Labs  Lab 02/05/18 1555 02/05/18 2106 02/06/18 0244  TROPONINI 0.36* 1.74* 2.65*      Radiology    Dg Chest 2 View  Result Date: 02/05/2018 CLINICAL DATA:  Shortness of breath. EXAM: CHEST  2 VIEW COMPARISON:  Radiographs of February 03, 2018. FINDINGS: The heart size and mediastinal contours are within normal limits. Atherosclerosis of thoracic aorta is noted. Increased bibasilar interstitial densities are noted  concerning for pulmonary edema. Minimal bilateral pleural effusions are noted. No pneumothorax is noted. The visualized skeletal structures are unremarkable. IMPRESSION: Increased bilateral pulmonary edema. Minimal bilateral pleural effusions. Aortic Atherosclerosis (ICD10-I70.0). Electronically Signed   By: Lupita Raider, M.D.   On: 02/05/2018 17:44    Telemetry    RSR, pvc's, 7 beats NSVT - Personally Reviewed  Cardiac Studies   a.1998 s/p MI and BMS [facility], [place]); b. 1999 redo PCI/rotablator in setting of what sounds like ISR;  c. Multiple stress tests over the years - last ~ 2017, reportedly nl; d. 01/2018 NSTEMI/Cath: LM 10m/d, LAD 50p, 40p/m, D1 60ost, OM1 95, RCA 100ost/p w/ L->R collats, EF 45%.  01/2018 Echo: EF 40-45%, mid-apicalanteroseptal, ant, apical sev HK, mod apicalinferior HK. Gr2 DD. Mod AS, mild MR, mod dil LA, PASP .  Patient Profile     72 y.o. male Schad Porrinois a 72 y.o.malewith a history of CAD, HTN, HL, DMII, Tob abuse, and COPD who was admitted to [facility] on 2/20 with dyspnea, pulm edema, and NSTEMI, and was subsequently found to have severe distal LM dzs  awaiting tx to [facility] for thoracic surgery eval.  Assessment & Plan    1.  NSTEMI/CAD: h/o CAD s/p prior inf infarct and LAD stenting in 1998 (CTO RCA) w/ subsequent repeat PCI/rotablator w/in the LAD a few months later.  Recently dx with influenza A in setting of dyspnea, wheezing, malaise, and low grade fever. Developed worsening dyspnea on 2/20 prompting presentation to [facility]  pulm edema and NSTEMI (peak 2.65). Cath 2/21 showed severe dist LM and OM1 dzs with CTO of the RCA.  Also w/ mod Ao Stenosis.  No chest pain overnight.  Dyspnea improving.  Cont heparin, asa, statin,  blocker, ARB.  Awaiting tx to [facility] today.  Discussed with [facility].  Ok for tele bed given stability.  2.  Essential HTN/Hypertensive urgency:  In setting of above.  BP cont to trend high.  Titrate losartan.  Cont CCB and  blocker.  Diurese.  3.  Moderate AS:  CT surgery to review for probable AVR in addition to CABG.  4.  ICM/Acute systolic CHF:  EF 40-45%. EDP 40 on cath.  -1300 overnight. Renal fxn stable. Cont IV lasix,  blocker, ARB.  5.  HL:  LDL 74. Cont high potency statin.  6.  Tob Abuse:  Cessation advised.  Says he quit earlier this week.  7.  COPD:  Cont  inhalers/nebs.  Wean from IV to PO steroids.  8.  Influenza A:  Cont tamiflu.  9.  Bilateral Carotid Bruits:  ? 2/2 radiated murmur.  F/u carotid u/s.  10. NSVT:  Cont  blocker.  Signed, Nicolasa Ducking, NP  02/07/2018, 8:02 AM    For questions or updates, please contact   Please consult [email or web address] for contact info under Cardiology/STEMI.

## 2018-02-07 NOTE — Progress Notes (Signed)
Inpatient Diabetes Program Recommendations  AACE/ADA: New Consensus Statement on Inpatient Glycemic Control (2015)  Target Ranges:  Prepandial:   less than 140 mg/dL      Peak postprandial:   less than 180 mg/dL (1-2 hours)      Critically ill patients:  140 - 180 mg/dL   Lab Results  Component Value Date   GLUCAP 214 (H) 02/07/2018   HGBA1C 7.0 (H) 02/05/2018    Review of Glycemic Control  Results for Harold Parker, Harold Parker (MRN [id number]) as of 02/07/2018 11:29  Ref. Range 02/06/2018 07:58 02/06/2018 11:32 02/06/2018 16:59 02/06/2018 20:39 02/07/2018 08:33  Glucose-Capillary Latest Ref Range: 65 - 99 mg/dL 747 (H) 340 (H) 370 (H) 177 (H) 214 (H)   Diabetes history: Type 2 Outpatient Diabetes medications: Metformin 500mg  qam  Current orders for Inpatient glycemic control: Novolog 0-15 units tid, Novolog 0-5 units qhs, Lantus 8 units qhs  * prednisone 50mg  qam  Inpatient Diabetes Program Recommendations: Agree with current medications for blood sugar management.  Noted steroids tapered- anticipate CBG will improve.   Susette Racer, RN, BA, MHA, CDE Diabetes Coordinator Inpatient Diabetes Program  [phone] (Team Pager) [phone] [facility]) 02/07/2018 11:33 AM

## 2018-02-07 NOTE — Progress Notes (Signed)
ANTICOAGULATION CONSULT NOTE  Pharmacy Consult for Heparin drip Indication: chest pain/ACS  No Known Allergies  Patient Measurements: Height: 5\' 6"  (167.6 cm) Weight: 167 lb 11.2 oz (76.1 kg) IBW/kg (Calculated) : 63.8 Heparin Dosing Weight: 76  Vital Signs: Temp: 98.4 F (36.9 C) (02/22 0441) Temp Source: Oral (02/22 0441) BP: 157/56 (02/22 0441) Pulse Rate: 67 (02/22 0441)  Labs: Recent Labs    02/05/18 1555 02/05/18 2106 02/06/18 0244 02/06/18 0848 02/07/18 0419  HGB 15.4  --  14.5  --  15.6  HCT 45.5  --  42.9  --  44.6  PLT 205  --  204  --  205  APTT  --   --   --  49*  --   LABPROT  --   --   --  13.0  --   INR  --   --   --  0.99  --   HEPARINUNFRC  --   --   --   --  <0.10*  CREATININE 0.77  --  0.92  --  0.91  TROPONINI 0.36* 1.74* 2.65*  --   --     Estimated Creatinine Clearance: 65.2 mL/min (by C-G formula based on SCr of 0.91 mg/dL).   Medical History: Past Medical History:  Diagnosis Date  . Bilateral carotid bruits   . CAD (coronary artery disease)    a. 1998 s/p MI and BMS [facility], [place]); b. 1999 redo PCI/rotablator in setting of what sounds like ISR;  c. Multiple stress tests over the years - last ~ 2017, reportedly nl; d. 01/2018 NSTEMI/Cath: LM 28m/d, LAD 50p, 40p/m, D1 60ost, OM1 95, RCA 100ost/p w/ L->R collats, EF 45%.  Marland Kitchen COPD (chronic obstructive pulmonary disease) (HCC)   . Diabetes mellitus without complication (HCC)   . HTN (hypertension)   . Hypercholesteremia   . Ischemic cardiomyopathy    a. 01/2018 Echo: EF 40-45%, mid-apicalanteroseptal, ant, apical sev HK, mod apicalinferior HK. Gr2 DD. Mod AS, mild MR, mod dil LA, PASP .  . Moderate aortic stenosis    a. 01/2018 Echo: Mod AS, mean grad (S) , Valve area (VTI) 1.06 cm^2, (Vmax) 1.27 cm^2.  . Tobacco abuse     Assessment: 72 y/o M with known h/o CAD admitted with with SOB, chest pain, and elevated troponin being worked up for possible ACS. Patient received Lovenox  40 mg last PM and is to begin heparin infusion this AM.    Goal of Therapy:  Heparin level 0.3-0.7 units/ml Monitor platelets by anticoagulation protocol: Yes   Plan:  Give 3000 units bolus x 1 (reduced bolus of 40 units/kg due to recent Lovenox administration) Start heparin infusion at 900 units/hr (12 units/kg/hr) Check anti-Xa level in 8 hours and daily while on heparin Continue to monitor H&H and platelets  2/21 @ 1530: Cardiac cath revealed severe three-vessel CAD and aortic stenosis. Patient will be transferred to [facility] for CABG and AVR. Heparin infusion to resume 8 hours post sheath removal (1230). Will resume heparin infusion at 2030 at previous rate without bolus. Will check HL/CBC at  0430.   02/22 AM heparin level <0.1. 2300 unit bolus and increase rate to 1200 units/hr. Recheck in 8 hours.  Peta Peachey S 02/07/2018,7:01 AM

## 2018-02-07 NOTE — Progress Notes (Signed)
ANTICOAGULATION CONSULT NOTE - Follow Up Consult  Pharmacy Consult for Heparin Indication: chest pain/ACS  No Known Allergies  Patient Measurements: Height: 5\' 6"  (167.6 cm) Weight: 167 lb 11.2 oz (76.1 kg) IBW/kg (Calculated) : 63.8 Heparin Dosing Weight: 76.1 kg  Vital Signs: Temp: 99 F (37.2 C) (02/22 1942) Temp Source: Oral (02/22 1942) BP: 177/89 (02/22 2102) Pulse Rate: 82 (02/22 2102)  Labs: Recent Labs    02/05/18 1555 02/05/18 2106 02/06/18 0244 02/06/18 0848 02/07/18 0419 02/07/18 2125  HGB 15.4  --  14.5  --  15.6  --   HCT 45.5  --  42.9  --  44.6  --   PLT 205  --  204  --  205  --   APTT  --   --   --  49*  --   --   LABPROT  --   --   --  13.0  --   --   INR  --   --   --  0.99  --   --   HEPARINUNFRC  --   --   --   --  <0.10* 0.13*  CREATININE 0.77  --  0.92  --  0.91  --   TROPONINI 0.36* 1.74* 2.65*  --   --   --     Estimated Creatinine Clearance: 65.2 mL/min (by C-G formula based on SCr of 0.91 mg/dL).   Assessment:  Anticoag: heparin for ACS. Last level on 2/22 AM undetectable-adjusted at [facility] > tx'd to [facility]. HL low 0.13.  Goal of Therapy:  Heparin level 0.3-0.7 units/ml Monitor platelets by anticoagulation protocol: Yes   Plan:  Rebolus heparin 2000 units and increase infusion to 1350 units/hr Next heparin level and CBC in AM.   Unnamed Hino S. Merilynn Finland, PharmD, BCPS Clinical Staff Pharmacist Pager [phone]  Misty Stanley Stillinger 02/07/2018,10:40 PM

## 2018-02-07 NOTE — Plan of Care (Signed)
  Cardiac: Ability to achieve and maintain adequate cardiopulmonary perfusion will improve 02/07/2018 2348 - Progressing by Viviano Simas, RN Note Pt awaiting work up completion for CABG and AVR. Pt knowledgeable at this time of required procedure. All questions and concerns have been addressed at this point.  Vascular access site(s) Level 0-1 will be maintained 02/07/2018 2348 - Progressing by Viviano Simas, RN Note Pt cath site a level one; small visible bruising around site. Evidence of healing.    Activity: Ability to tolerate increased activity will improve 02/07/2018 2348 - Progressing by Viviano Simas, RN Note Pt ambulatory and independent.

## 2018-02-07 NOTE — Progress Notes (Signed)
[facility] consult with patient for HCPOA. CH provided education to patient and one friend. Family is on their way to hospital from out of state. Patient requested a priest. [facility] contacted priest and [facility] CH to let them know to follow-up with patient as patient is being transferred for surgery. Patient will contact CH when ready to complete the HCPOA either here or at [facility]. [facility] provided education and emotional support.

## 2018-02-07 NOTE — Progress Notes (Signed)
Attempted to call report. Nurse unavailable at the time, requested a phone number to call back. Will continue to monitor patient.

## 2018-02-07 NOTE — Plan of Care (Signed)
Patient being transferred to [facility]   Not Applicable Education: Knowledge of General Education information will improve 02/07/2018 1455 - Not Applicable by Erma Heritage, RN Note Patient being transferred to [facility] Behavior/Discharge Planning: Ability to manage health-related needs will improve 02/07/2018 1455 - Not Applicable by Erma Heritage, RN Clinical Measurements: Ability to maintain clinical measurements within normal limits will improve 02/07/2018 1455 - Not Applicable by Weldon Picking, Manuella Ghazi, RN Will remain free from infection 02/07/2018 1455 - Not Applicable by Erma Heritage, RN Diagnostic test results will improve 02/07/2018 1455 - Not Applicable by Erma Heritage, RN Respiratory complications will improve 02/07/2018 1455 - Not Applicable by Erma Heritage, RN Cardiovascular complication will be avoided 02/07/2018 1455 - Not Applicable by Weldon Picking, Manuella Ghazi, RN Activity: Risk for activity intolerance will decrease 02/07/2018 1455 - Not Applicable by Erma Heritage, RN Nutrition: Adequate nutrition will be maintained 02/07/2018 1455 - Not Applicable by Weldon Picking, Manuella Ghazi, RN Coping: Level of anxiety will decrease 02/07/2018 1455 - Not Applicable by Weldon Picking, Manuella Ghazi, RN Elimination: Will not experience complications related to bowel motility 02/07/2018 1455 - Not Applicable by Weldon Picking, Manuella Ghazi, RN Will not experience complications related to urinary retention 02/07/2018 1455 - Not Applicable by Erma Heritage, RN Pain Managment: General experience of comfort will improve 02/07/2018 1455 - Not Applicable by Weldon Picking, Manuella Ghazi, RN Safety: Ability to remain free from injury will improve 02/07/2018 1455 - Not Applicable by Weldon Picking, Manuella Ghazi, RN Skin Integrity: Risk for impaired skin integrity will decrease 02/07/2018 1455 - Not Applicable by Erma Heritage, RN Education: Knowledge of disease or condition will improve 02/07/2018 1455 - Not Applicable by Weldon Picking, Manuella Ghazi, RN Knowledge of the prescribed therapeutic regimen will improve 02/07/2018 1455 - Not Applicable by Erma Heritage, RN Respiratory: Ability to maintain a clear airway will improve 02/07/2018 1455 - Not Applicable by Weldon Picking, Manuella Ghazi, RN Levels of oxygenation will improve 02/07/2018 1455 - Not Applicable by Erma Heritage, RN Ability to maintain adequate ventilation will improve 02/07/2018 1455 - Not Applicable by Erma Heritage, RN Education: Understanding of CV disease, CV risk reduction, and recovery process will improve 02/07/2018 1455 - Not Applicable by Weldon Picking, Manuella Ghazi, RN Activity: Ability to return to baseline activity level will improve 02/07/2018 1455 - Not Applicable by Erma Heritage, RN Cardiovascular: Ability to achieve and maintain adequate cardiovascular perfusion will improve 02/07/2018 1455 - Not Applicable by Erma Heritage, RN Vascular access site(s) Level 0-1 will be maintained 02/07/2018 1455 - Not Applicable by Erma Heritage, RN Health Behavior/Discharge Planning: Ability to safely manage health-related needs after discharge will improve 02/07/2018 1455 - Not Applicable by Erma Heritage, RN

## 2018-02-07 NOTE — Progress Notes (Signed)
Report given to [facility]. Vital signs taken, 20gauge IV left in with Heparin running at 39ml/hr. 22gauge on the left wrist came out while care link transferred him from the bed to the stretcher. Patient stable upon transfer with significant other at bedside. Patient transported via [facility]. Report given to Tina,RN at [facility].

## 2018-02-07 NOTE — Discharge Summary (Signed)
[employer] - [place] at [facility]   PATIENT NAME: Harold Parker    MR#:  [id number]  DATE OF BIRTH:  Mar 29, 1946  DATE OF ADMISSION:  02/05/2018   ADMITTING PHYSICIAN: Shaune Pollack, MD  DATE OF DISCHARGE:02/07/2018  PRIMARY CARE PHYSICIAN: System, Pcp Not In   ADMISSION DIAGNOSIS:  Acute pulmonary edema (HCC) [J81.0] COPD exacerbation (HCC) [J44.1] NSTEMI (non-ST elevated myocardial infarction) (HCC) [I21.4] Acute respiratory failure with hypoxia (HCC) [J96.01] DISCHARGE DIAGNOSIS:  Principal Problem:   NSTEMI (non-ST elevated myocardial infarction) (HCC) Active Problems:   COPD exacerbation (HCC)  SECONDARY DIAGNOSIS:   Past Medical History:  Diagnosis Date  . Bilateral carotid bruits    a. 01/2018 U/S: < 50% bilat ICA stenosis.  Marland Kitchen CAD (coronary artery disease)    a. 1998 s/p MI and BMS [facility], [place]); b. 1999 redo PCI/rotablator in setting of what sounds like ISR;  c. Multiple stress tests over the years - last ~ 2017, reportedly nl; d. 01/2018 NSTEMI/Cath: LM 25m/d, LAD 50p, 40p/m, D1 60ost, OM1 95, RCA 100ost/p w/ L->R collats, EF 45%.  Marland Kitchen COPD (chronic obstructive pulmonary disease) (HCC)   . Diabetes mellitus without complication (HCC)   . HTN (hypertension)   . Hypercholesteremia   . Ischemic cardiomyopathy    a. 01/2018 Echo: EF 40-45%, mid-apicalanteroseptal, ant, apical sev HK, mod apicalinferior HK. Gr2 DD. Mod AS, mild MR, mod dil LA, PASP .  . Moderate aortic stenosis    a. 01/2018 Echo: Mod AS, mean grad (S) , Valve area (VTI) 1.06 cm^2, (Vmax) 1.27 cm^2.  . Tobacco abuse    HOSPITAL COURSE:   Non-STEMI and CAD Elevated troponinup to 2.65.  Continue heparin drip, continue aspirin 325 mg p.o. Daily,Lopressorand Lipitor.  Echocardiograph: EF 45-50% with severe hypokinesis of the mid to distal segments including anterior, anteroseptal and inferior walls. Cardiac cath show: 1.  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  The coronary arteries are overall heavily calcified and diffusely diseased. 2.  Mildly reduced LV systolic function with an EF of 40%.  Echocardiogram showed wall motion abnormalities in the anterior as well as inferior walls.  3.  Severely elevated filling pressures in the setting of severely elevated blood pressure. 4.  Moderate aortic stenosis with a peak to peak gradient of 20 mmHg.  Recommendations by Dr. Kirke Corin: Transfer to [facility] for CABG and aortic valve replacement.  The patient will need to be tuned up given flu.  He also appears to be volume overloaded and blood pressure is uncontrolled.  I added losartan and will diuresis with furosemide. Heparin drip to be resumed in 8 hours.  ICM/Acute systolic CHF:  EF 40-45%. Cont IV lasix, ? blocker, ARB   Acute respiratory failure with hypoxia due to COPD exacerbation and pulmonary edema Secondary to influenzaA. Discontinued IV steroid, changed to prednisone taper, albuterol every 6 hours, Robitussin as needed.  Dehydration due to above. Follow-up BMP.  Accelerated hypertension. IV hydralazine as needed, added losartan and will diuresis with furosemide.  Diabetes. Increased lantus 10 unit HS and continue sliding scale. Hyperlipidemia.  Continue Lipitor. Tobacco abuse. Smoking cessation was counseled for 3-4 minutes. Nicotine patch. The patient wantsto quit. DISCHARGE CONDITIONS:  Guarded.  Transferred to [facility] for CABG. CONSULTS OBTAINED:  Treatment Team:  Iran Ouch, MD DRUG ALLERGIES:  No Known Allergies DISCHARGE MEDICATIONS:   Allergies as of 02/07/2018   No Known Allergies  Medication List    STOP taking these medications   atenolol 50 MG tablet Commonly known as:  TENORMIN   metFORMIN 500 MG tablet Commonly known as:  GLUCOPHAGE   simvastatin 20 MG tablet Commonly known as:  ZOCOR     TAKE these  medications   amLODipine 2.5 MG tablet Commonly known as:  NORVASC Take 2.5 mg by mouth 2 (two) times daily.   aspirin 325 MG EC tablet Take 325 mg by mouth daily.   atorvastatin 40 MG tablet Commonly known as:  LIPITOR Take 1 tablet (40 mg total) by mouth daily at 6 PM.   chlorpheniramine-HYDROcodone 10-8 MG/5ML Suer Commonly known as:  TUSSIONEX PENNKINETIC ER Take 5 mLs by mouth 2 (two) times daily.   ezetimibe 10 MG tablet Commonly known as:  ZETIA Take 10 mg by mouth daily.   folic acid 400 MCG tablet Commonly known as:  FOLVITE Take 400 mcg by mouth every evening.   furosemide 10 MG/ML injection Commonly known as:  LASIX Inject 2 mLs (20 mg total) into the vein every 12 (twelve) hours.   Grapeseed Extract 500-50 MG Caps Take 1 tablet by mouth 2 (two) times daily.   heparin 100-0.45 UNIT/ML-% infusion Inject 1,200 Units/hr into the vein continuous.   ipratropium-albuterol 0.5-2.5 (3) MG/3ML Soln Commonly known as:  DUONEB Take 3 mLs by nebulization every 6 (six) hours as needed.   isosorbide mononitrate 60 MG 24 hr tablet Commonly known as:  IMDUR Take 60 mg by mouth daily.   losartan 100 MG tablet Commonly known as:  COZAAR Take 1 tablet (100 mg total) by mouth daily. Start taking on:  02/08/2018   oseltamivir 75 MG capsule Commonly known as:  TAMIFLU Take 1 capsule (75 mg total) by mouth 2 (two) times daily for 5 days.   predniSONE 50 MG tablet Commonly known as:  DELTASONE taper   Resveratrol 250 MG Caps Take 500 mg by mouth 2 (two) times daily.   vitamin C 1000 MG tablet Take 1,000 mg by mouth daily.   VITAMIN D (CHOLECALCIFEROL) PO Take by mouth every morning.        DISCHARGE INSTRUCTIONS:  See AVS/  If you experience worsening of your admission symptoms, develop shortness of breath, life threatening emergency, suicidal or homicidal thoughts you must seek medical attention immediately by calling 911 or calling your MD immediately  if  symptoms less severe.  You Must read complete instructions/literature along with all the possible adverse reactions/side effects for all the Medicines you take and that have been prescribed to you. Take any new Medicines after you have completely understood and accpet all the possible adverse reactions/side effects.   Please note  You were cared for by a hospitalist during your hospital stay. If you have any questions about your discharge medications or the care you received while you were in the hospital after you are discharged, you can call the unit and asked to speak with the hospitalist on call if the hospitalist that took care of you is not available. Once you are discharged, your primary care physician will handle any further medical issues. Please note that NO REFILLS for any discharge medications will be authorized once you are discharged, as it is imperative that you return to your primary care physician (or establish a relationship with a primary care physician if you do not have one) for your aftercare needs so that they can reassess your need for medications and monitor your lab values.  On the day of Discharge:  VITAL SIGNS:  Blood pressure (!) 156/51, pulse 66, temperature 98.6 F (37 C), temperature source Oral, resp. rate 18, height 5\' 6"  (1.676 m), weight 167 lb 11.2 oz (76.1 kg), SpO2 96 %. PHYSICAL EXAMINATION:  GENERAL:  72 y.o.-year-old patient lying in the bed with no acute distress.  EYES: Pupils equal, round, reactive to light and accommodation. No scleral icterus. Extraocular muscles intact.  HEENT: Head atraumatic, normocephalic. Oropharynx and nasopharynx clear.  NECK:  Supple, no jugular venous distention. No thyroid enlargement, no tenderness.  LUNGS: Coarse breath sounds bilaterally, no wheezing, rales,rhonchi or crepitation. No use of accessory muscles of respiration.  CARDIOVASCULAR: S1, S2 normal. No murmurs, rubs, or gallops.  ABDOMEN: Soft, non-tender,  non-distended. Bowel sounds present. No organomegaly or mass.  EXTREMITIES: No pedal edema, cyanosis, or clubbing.  NEUROLOGIC: Cranial nerves II through XII are intact. Muscle strength 5/5 in all extremities. Sensation intact. Gait not checked.  PSYCHIATRIC: The patient is alert and oriented x 3.  SKIN: No obvious rash, lesion, or ulcer.  DATA REVIEW:   CBC Recent Labs  Lab 02/07/18 0419  WBC 9.0  HGB 15.6  HCT 44.6  PLT 205    Chemistries  Recent Labs  Lab 02/03/18 0950  02/05/18 2106  02/07/18 0419  NA 137   < >  --    < > 139  K 4.3   < >  --    < > 4.2  CL 105   < >  --    < > 102  CO2 22   < >  --    < > 29  GLUCOSE 167*   < >  --    < > 217*  BUN 22*   < >  --    < > 30*  CREATININE 0.84   < >  --    < > 0.91  CALCIUM 8.7*   < >  --    < > 8.5*  MG  --   --  2.0  --   --   AST 54*  --   --   --   --   ALT 25  --   --   --   --   ALKPHOS 45  --   --   --   --   BILITOT 0.9  --   --   --   --    < > = values in this interval not displayed.     Microbiology Results  No results found for this or any previous visit.  RADIOLOGY:  US Carotid Bilateral  Result Date: 02/07/2018 CLINICAL DATA:  Asymptomatic bilateral carotid bruits EXAM: BILATERAL CAROTID DUPLEX ULTRASOUND TECHNIQUE: Wallace Cullens scale imaging, color Doppler and duplex ultrasound were performed of bilateral carotid and vertebral arteries in the neck. COMPARISON:  None. FINDINGS: Criteria: Quantification of carotid stenosis is based on velocity parameters that correlate the residual internal carotid diameter with NASCET-based stenosis levels, using the diameter of the distal internal carotid lumen as the denominator for stenosis measurement. The following velocity measurements were obtained: RIGHT ICA:  100/17 cm/sec CCA:  79/12 cm/sec SYSTOLIC ICA/CCA RATIO:  1.3 DIASTOLIC ICA/CCA RATIO:  1.5 ECA:  190 cm/sec LEFT ICA:  121/22 cm/sec CCA:  99/15 cm/sec SYSTOLIC ICA/CCA RATIO:  1.2 DIASTOLIC ICA/CCA RATIO:  1.5 ECA:   190 cm/sec RIGHT CAROTID ARTERY: Moderate diffuse mixed echogenicity carotid atherosclerosis. Despite this, no hemodynamically significant right ICA stenosis, velocity elevation, or  turbulent flow. Degree of narrowing less than 50% by ultrasound criteria. RIGHT VERTEBRAL ARTERY:  Antegrade LEFT CAROTID ARTERY: Similar moderate diffuse mixed echogenicity atherosclerosis. Despite this, no hemodynamically significant left ICA stenosis, velocity elevation, or turbulent flow. LEFT VERTEBRAL ARTERY:  Antegrade IMPRESSION: Moderate bilateral carotid atherosclerosis. No hemodynamically significant ICA stenosis by ultrasound. Degree of narrowing less than 50% bilaterally by ultrasound criteria. Patent antegrade vertebral flow bilaterally Electronically Signed   By: Judie Petit.  Shick M.D.   On: 02/07/2018 10:47     Management plans discussed with the patient, family and they are in agreement.  CODE STATUS: Full Code   TOTAL TIME TAKING CARE OF THIS PATIENT: 33 minutes.    Shaune Pollack M.D on 02/07/2018 at 2:51 PM  Between 7am to 6pm - Pager - [phone]  After 6pm go to [email or web address] - Social research officer, government  [employer] [facility] [place] [facility]  Office  [phone]  CC: Primary care physician; System, Pcp Not In   Note: This dictation was prepared with Dragon dictation along with smaller phrase technology. Any transcriptional errors that result from this process are unintentional.

## 2018-02-07 NOTE — Progress Notes (Addendum)
Bed available at [facility] for patient transport. Will call report. MD paged to notify.WIll continue to monitor patient.

## 2018-02-07 NOTE — Progress Notes (Signed)
[facility] [place] [facility]   PATIENT NAME: Harold Parker    MR#:  [id number]  DATE OF BIRTH:  [birth year]  DATE OF ADMISSION:  02/05/2018   ADMITTING PHYSICIAN: Shaune Pollack, MD  DATE OF DISCHARGE:02/07/2018  PRIMARY CARE PHYSICIAN: System, Pcp Not In   ADMISSION DIAGNOSIS:  Acute pulmonary edema (HCC) [J81.0] COPD exacerbation (HCC) [J44.1] NSTEMI (non-ST elevated myocardial infarction) (HCC) [I21.4] Acute respiratory failure with hypoxia (HCC) [J96.01] DISCHARGE DIAGNOSIS:  Principal Problem:   NSTEMI (non-ST elevated myocardial infarction) (HCC) Active Problems:   COPD exacerbation (HCC)  SECONDARY DIAGNOSIS:   Past Medical History:  Diagnosis Date  . Bilateral carotid bruits    a. 01/2018 U/S: < 50% bilat ICA stenosis.  Marland Kitchen CAD (coronary artery disease)    a. 1998 s/p MI and BMS [facility], [place]); b. 1999 redo PCI/rotablator in setting of what sounds like ISR;  c. Multiple stress tests over the years - last ~ 2017, reportedly nl; d. 01/2018 NSTEMI/Cath: LM 15m/d, LAD 50p, 40p/m, D1 60ost, OM1 95, RCA 100ost/p w/ L->R collats, EF 45%.  Marland Kitchen COPD (chronic obstructive pulmonary disease) (HCC)   . Diabetes mellitus without complication (HCC)   . HTN (hypertension)   . Hypercholesteremia   . Ischemic cardiomyopathy    a. 01/2018 Echo: EF 40-45%, mid-apicalanteroseptal, ant, apical sev HK, mod apicalinferior HK. Gr2 DD. Mod AS, mild MR, mod dil LA, PASP .  . Moderate aortic stenosis    a. 01/2018 Echo: Mod AS, mean grad (S) , Valve area (VTI) 1.06 cm^2, (Vmax) 1.27 cm^2.  . Tobacco abuse    HOSPITAL COURSE:   Non-STEMI and CAD Elevated troponinup to 2.65.  Continue heparin drip, continue aspirin 325 mg p.o. Daily,Lopressorand Lipitor.  Echocardiograph: EF 45-50% with severe hypokinesis of the mid to distal segments including anterior, anteroseptal and inferior walls. Cardiac cath show: 1.  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  The coronary arteries are overall heavily calcified and diffusely diseased. 2.  Mildly reduced LV systolic function with an EF of 40%.  Echocardiogram showed wall motion abnormalities in the anterior as well as inferior walls.  3.  Severely elevated filling pressures in the setting of severely elevated blood pressure. 4.  Moderate aortic stenosis with a peak to peak gradient of 20 mmHg.  Recommendations by Dr. Kirke Corin: Transfer to [facility] for CABG and aortic valve replacement.  The patient will need to be tuned up given flu.  He also appears to be volume overloaded and blood pressure is uncontrolled.  I added losartan and will diuresis with furosemide. Heparin drip to be resumed in 8 hours.  ICM/Acute systolic CHF:  EF 40-45%. Cont IV lasix, ? blocker, ARB   Acute respiratory failure with hypoxia due to COPD exacerbation and pulmonary edema Secondary to influenzaA. Discontinued IV steroid, changed to prednisone taper, albuterol every 6 hours, Robitussin as needed.  Dehydration due to above. Follow-up BMP.  Accelerated hypertension. IV hydralazine as needed, added losartan and will diuresis with furosemide.  Diabetes. Increased lantus 10 unit HS and continue sliding scale. Hyperlipidemia.  Continue Lipitor. Tobacco abuse. Smoking cessation was counseled for 3-4 minutes. Nicotine patch. The patient wantsto quit. DISCHARGE CONDITIONS:  Guarded.  Transferred to [facility] for CABG. CONSULTS OBTAINED:  Treatment Team:  Iran Ouch, MD DRUG ALLERGIES:  No Known Allergies DISCHARGE MEDICATIONS:   Allergies as of 02/07/2018   No Known Allergies  Medication List    STOP taking these medications   atenolol 50 MG tablet Commonly known as:  TENORMIN   metFORMIN 500 MG tablet Commonly known as:  GLUCOPHAGE   simvastatin 20 MG tablet Commonly known as:  ZOCOR     TAKE these  medications   amLODipine 2.5 MG tablet Commonly known as:  NORVASC Take 2.5 mg by mouth 2 (two) times daily.   aspirin 325 MG EC tablet Take 325 mg by mouth daily.   atorvastatin 40 MG tablet Commonly known as:  LIPITOR Take 1 tablet (40 mg total) by mouth daily at 6 PM.   chlorpheniramine-HYDROcodone 10-8 MG/5ML Suer Commonly known as:  TUSSIONEX PENNKINETIC ER Take 5 mLs by mouth 2 (two) times daily.   ezetimibe 10 MG tablet Commonly known as:  ZETIA Take 10 mg by mouth daily.   folic acid 400 MCG tablet Commonly known as:  FOLVITE Take 400 mcg by mouth every evening.   furosemide 10 MG/ML injection Commonly known as:  LASIX Inject 2 mLs (20 mg total) into the vein every 12 (twelve) hours.   Grapeseed Extract 500-50 MG Caps Take 1 tablet by mouth 2 (two) times daily.   heparin 100-0.45 UNIT/ML-% infusion Inject 1,200 Units/hr into the vein continuous.   ipratropium-albuterol 0.5-2.5 (3) MG/3ML Soln Commonly known as:  DUONEB Take 3 mLs by nebulization every 6 (six) hours as needed.   isosorbide mononitrate 60 MG 24 hr tablet Commonly known as:  IMDUR Take 60 mg by mouth daily.   losartan 100 MG tablet Commonly known as:  COZAAR Take 1 tablet (100 mg total) by mouth daily. Start taking on:  02/08/2018   oseltamivir 75 MG capsule Commonly known as:  TAMIFLU Take 1 capsule (75 mg total) by mouth 2 (two) times daily for 5 days.   predniSONE 50 MG tablet Commonly known as:  DELTASONE taper   Resveratrol 250 MG Caps Take 500 mg by mouth 2 (two) times daily.   vitamin C 1000 MG tablet Take 1,000 mg by mouth daily.   VITAMIN D (CHOLECALCIFEROL) PO Take by mouth every morning.        DISCHARGE INSTRUCTIONS:  See AVS/  If you experience worsening of your admission symptoms, develop shortness of breath, life threatening emergency, suicidal or homicidal thoughts you must seek medical attention immediately by calling 911 or calling your MD immediately  if  symptoms less severe.  You Must read complete instructions/literature along with all the possible adverse reactions/side effects for all the Medicines you take and that have been prescribed to you. Take any new Medicines after you have completely understood and accpet all the possible adverse reactions/side effects.   Please note  You were cared for by a hospitalist during your hospital stay. If you have any questions about your discharge medications or the care you received while you were in the hospital after you are discharged, you can call the unit and asked to speak with the hospitalist on call if the hospitalist that took care of you is not available. Once you are discharged, your primary care physician will handle any further medical issues. Please note that NO REFILLS for any discharge medications will be authorized once you are discharged, as it is imperative that you return to your primary care physician (or establish a relationship with a primary care physician if you do not have one) for your aftercare needs so that they can reassess your need for medications and monitor your lab values.  On the day of Discharge:  VITAL SIGNS:  Blood pressure (!) 156/51, pulse 66, temperature 98.6 F (37 C), temperature source Oral, resp. rate 18, height 5\' 6"  (1.676 m), weight 167 lb 11.2 oz (76.1 kg), SpO2 96 %. PHYSICAL EXAMINATION:  GENERAL:  72 y.o.-year-old patient lying in the bed with no acute distress.  EYES: Pupils equal, round, reactive to light and accommodation. No scleral icterus. Extraocular muscles intact.  HEENT: Head atraumatic, normocephalic. Oropharynx and nasopharynx clear.  NECK:  Supple, no jugular venous distention. No thyroid enlargement, no tenderness.  LUNGS: Coarse breath sounds bilaterally, no wheezing, rales,rhonchi or crepitation. No use of accessory muscles of respiration.  CARDIOVASCULAR: S1, S2 normal. No murmurs, rubs, or gallops.  ABDOMEN: Soft, non-tender,  non-distended. Bowel sounds present. No organomegaly or mass.  EXTREMITIES: No pedal edema, cyanosis, or clubbing.  NEUROLOGIC: Cranial nerves II through XII are intact. Muscle strength 5/5 in all extremities. Sensation intact. Gait not checked.  PSYCHIATRIC: The patient is alert and oriented x 3.  SKIN: No obvious rash, lesion, or ulcer.  DATA REVIEW:   CBC Recent Labs  Lab 02/07/18 0419  WBC 9.0  HGB 15.6  HCT 44.6  PLT 205    Chemistries  Recent Labs  Lab 02/03/18 0950  02/05/18 2106  02/07/18 0419  NA 137   < >  --    < > 139  K 4.3   < >  --    < > 4.2  CL 105   < >  --    < > 102  CO2 22   < >  --    < > 29  GLUCOSE 167*   < >  --    < > 217*  BUN 22*   < >  --    < > 30*  CREATININE 0.84   < >  --    < > 0.91  CALCIUM 8.7*   < >  --    < > 8.5*  MG  --   --  2.0  --   --   AST 54*  --   --   --   --   ALT 25  --   --   --   --   ALKPHOS 45  --   --   --   --   BILITOT 0.9  --   --   --   --    < > = values in this interval not displayed.     Microbiology Results  No results found for this or any previous visit.  RADIOLOGY:  US Carotid Bilateral  Result Date: 02/07/2018 CLINICAL DATA:  Asymptomatic bilateral carotid bruits EXAM: BILATERAL CAROTID DUPLEX ULTRASOUND TECHNIQUE: Wallace Cullens scale imaging, color Doppler and duplex ultrasound were performed of bilateral carotid and vertebral arteries in the neck. COMPARISON:  None. FINDINGS: Criteria: Quantification of carotid stenosis is based on velocity parameters that correlate the residual internal carotid diameter with NASCET-based stenosis levels, using the diameter of the distal internal carotid lumen as the denominator for stenosis measurement. The following velocity measurements were obtained: RIGHT ICA:  100/17 cm/sec CCA:  79/12 cm/sec SYSTOLIC ICA/CCA RATIO:  1.3 DIASTOLIC ICA/CCA RATIO:  1.5 ECA:  190 cm/sec LEFT ICA:  121/22 cm/sec CCA:  99/15 cm/sec SYSTOLIC ICA/CCA RATIO:  1.2 DIASTOLIC ICA/CCA RATIO:  1.5 ECA:   190 cm/sec RIGHT CAROTID ARTERY: Moderate diffuse mixed echogenicity carotid atherosclerosis. Despite this, no hemodynamically significant right ICA stenosis, velocity elevation, or  turbulent flow. Degree of narrowing less than 50% by ultrasound criteria. RIGHT VERTEBRAL ARTERY:  Antegrade LEFT CAROTID ARTERY: Similar moderate diffuse mixed echogenicity atherosclerosis. Despite this, no hemodynamically significant left ICA stenosis, velocity elevation, or turbulent flow. LEFT VERTEBRAL ARTERY:  Antegrade IMPRESSION: Moderate bilateral carotid atherosclerosis. No hemodynamically significant ICA stenosis by ultrasound. Degree of narrowing less than 50% bilaterally by ultrasound criteria. Patent antegrade vertebral flow bilaterally Electronically Signed   By: Judie Petit.  Shick M.D.   On: 02/07/2018 10:47     Management plans discussed with the patient, family and they are in agreement.  CODE STATUS: Full Code   TOTAL TIME TAKING CARE OF THIS PATIENT: 33 minutes.    Shaune Pollack M.D on 02/07/2018 at 2:44 PM  Between 7am to 6pm - Pager - [phone]  After 6pm go to [email or web address] - Social research officer, government  [facility]  Office  [phone]  CC: Primary care physician; System, Pcp Not In   Note: This dictation was prepared with Dragon dictation along with smaller phrase technology. Any transcriptional errors that result from this process are unintentional.

## 2018-02-08 ENCOUNTER — Encounter (HOSPITAL_COMMUNITY): Payer: Self-pay | Admitting: Thoracic Surgery (Cardiothoracic Vascular Surgery)

## 2018-02-08 DIAGNOSIS — I2583 Coronary atherosclerosis due to lipid rich plaque: Secondary | ICD-10-CM

## 2018-02-08 DIAGNOSIS — I350 Nonrheumatic aortic (valve) stenosis: Secondary | ICD-10-CM

## 2018-02-08 DIAGNOSIS — I2510 Atherosclerotic heart disease of native coronary artery without angina pectoris: Secondary | ICD-10-CM

## 2018-02-08 DIAGNOSIS — J441 Chronic obstructive pulmonary disease with (acute) exacerbation: Secondary | ICD-10-CM

## 2018-02-08 DIAGNOSIS — Z720 Tobacco use: Secondary | ICD-10-CM | POA: Diagnosis present

## 2018-02-08 DIAGNOSIS — I5021 Acute systolic (congestive) heart failure: Secondary | ICD-10-CM

## 2018-02-08 DIAGNOSIS — I255 Ischemic cardiomyopathy: Secondary | ICD-10-CM

## 2018-02-08 DIAGNOSIS — E785 Hyperlipidemia, unspecified: Secondary | ICD-10-CM

## 2018-02-08 LAB — BASIC METABOLIC PANEL
Anion gap: 12 (ref 5–15)
BUN: 24 mg/dL — ABNORMAL HIGH (ref 6–20)
CO2: 29 mmol/L (ref 22–32)
Calcium: 8.6 mg/dL — ABNORMAL LOW (ref 8.9–10.3)
Chloride: 96 mmol/L — ABNORMAL LOW (ref 101–111)
Creatinine, Ser: 0.75 mg/dL (ref 0.61–1.24)
GFR calc Af Amer: >60 mL/min (ref >60)
GFR calc non Af Amer: >60 mL/min (ref >60)
Glucose, Bld: 150 mg/dL — ABNORMAL HIGH (ref 65–99)
Potassium: 3.2 mmol/L — ABNORMAL LOW (ref 3.5–5.1)
Sodium: 137 mmol/L (ref 135–145)

## 2018-02-08 LAB — GLUCOSE, CAPILLARY
Glucose-Capillary: 144 mg/dL — ABNORMAL HIGH (ref 65–99)
Glucose-Capillary: 151 mg/dL — ABNORMAL HIGH (ref 65–99)
Glucose-Capillary: 246 mg/dL — ABNORMAL HIGH (ref 65–99)
Glucose-Capillary: 232 mg/dL — ABNORMAL HIGH (ref 65–99)

## 2018-02-08 LAB — HEPARIN LEVEL (UNFRACTIONATED)
Heparin Unfractionated: 0.4 IU/mL (ref 0.30–0.70)
Heparin Unfractionated: 0.25 IU/mL — ABNORMAL LOW (ref 0.30–0.70)
Heparin Unfractionated: 0.56 IU/mL (ref 0.30–0.70)

## 2018-02-08 LAB — CBC
HCT: 44.7 % (ref 39.0–52.0)
Hemoglobin: 15.5 g/dL (ref 13.0–17.0)
MCH: 30.9 pg (ref 26.0–34.0)
MCHC: 34.7 g/dL (ref 30.0–36.0)
MCV: 89.2 fL (ref 78.0–100.0)
Platelets: 235 10*3/uL (ref 150–400)
RBC: 5.01 MIL/uL (ref 4.22–5.81)
RDW: 13.3 % (ref 11.5–15.5)
WBC: 12 10*3/uL — ABNORMAL HIGH (ref 4.0–10.5)

## 2018-02-08 MED ORDER — MUPIROCIN 2 % EX OINT
1.0000 "application " | TOPICAL_OINTMENT | Freq: Two times a day (BID) | CUTANEOUS | Status: AC
  Administered 2018-02-08 – 2018-02-12 (×9): 1 via NASAL
  Filled 2018-02-08: qty 22

## 2018-02-08 MED ORDER — HEPARIN BOLUS VIA INFUSION
1000.0000 [IU] | Freq: Once | INTRAVENOUS | Status: AC
  Administered 2018-02-08: 1000 [IU] via INTRAVENOUS
  Filled 2018-02-08: qty 1000

## 2018-02-08 MED ORDER — CHLORHEXIDINE GLUCONATE CLOTH 2 % EX PADS
6.0000 | MEDICATED_PAD | Freq: Every day | CUTANEOUS | Status: AC
  Administered 2018-02-08 – 2018-02-12 (×5): 6 via TOPICAL

## 2018-02-08 MED ORDER — POTASSIUM CHLORIDE CRYS ER 20 MEQ PO TBCR
20.0000 meq | EXTENDED_RELEASE_TABLET | Freq: Two times a day (BID) | ORAL | Status: DC
  Administered 2018-02-08 – 2018-02-18 (×22): 20 meq via ORAL
  Filled 2018-02-08 (×22): qty 1

## 2018-02-08 MED ORDER — POTASSIUM CHLORIDE CRYS ER 20 MEQ PO TBCR
40.0000 meq | EXTENDED_RELEASE_TABLET | Freq: Once | ORAL | Status: AC
  Administered 2018-02-08: 40 meq via ORAL
  Filled 2018-02-08: qty 2

## 2018-02-08 MED ORDER — NICOTINE 14 MG/24HR TD PT24
14.0000 mg | MEDICATED_PATCH | Freq: Every day | TRANSDERMAL | Status: DC
  Administered 2018-02-08 – 2018-02-18 (×11): 14 mg via TRANSDERMAL
  Filled 2018-02-08 (×11): qty 1

## 2018-02-08 MED ORDER — ALPRAZOLAM 0.5 MG PO TABS
0.5000 mg | ORAL_TABLET | Freq: Two times a day (BID) | ORAL | Status: DC | PRN
  Administered 2018-02-08 – 2018-02-17 (×12): 0.5 mg via ORAL
  Filled 2018-02-08 (×12): qty 1

## 2018-02-08 MED ORDER — IPRATROPIUM-ALBUTEROL 0.5-2.5 (3) MG/3ML IN SOLN
3.0000 mL | Freq: Three times a day (TID) | RESPIRATORY_TRACT | Status: DC
  Administered 2018-02-08 – 2018-02-13 (×16): 3 mL via RESPIRATORY_TRACT
  Filled 2018-02-08 (×16): qty 3

## 2018-02-08 NOTE — Progress Notes (Signed)
Upon assessment, patient very anxious and breathing hard. Attempting to wash up at sink. Advised to sit down. Increased oxygen to 5 L. Administered breathing treatment and administered IV lasix. Oxygen 95% on 4 L nasal canula. BP elevated. Rhonci and inspiratory wheezing on lower bases. Patient now in bed, eating breakfast. Will continue to monitor.

## 2018-02-08 NOTE — Consult Note (Addendum)
[address]       [place] [address]             [phone]        Garnet Overfield [facility] Medical Record #[id number] Date of Birth: [birth year]  Referring: Tresa Endo Primary Care: System, Pcp Not In Primary Cardiologist:No primary care provider on file.  Chief Complaint:  CAD, LM Involvement, Influenza A  History of Present Illness:      Harold Parker is a 72 yo white male from [place] with a known history of Hypertension, Diabetes, Nicotine abuse, Hyperlipidemia, CAD S/P Stent in 1999 and COPD.  The patient states he was diagnosed with Influenza A on Monday.  He was started on Tamiflu and prednisone.  However, he developed worsening complaints of chest pain, shortness of breath, wheezing, and productive cough.  This prompted him to present to the ED at [facility].  Workup revealed the patient to be hypoxic.  His troponin level was minimally elevated at 0.36.  CXR showed evidence of bilateral pulmonary edema and minimal pleural effusions.  He was given ASA and Lasix in the ED and admitted for further care.  He was taken for cardiac catheterization on 02/06/2018 which revealed severe 3 vessel CAD with LM involvement.  His EF was mildly reduced at 40%.  He was also noted to have moderate aortic stenosis.  It was felt coronary bypass and aortic valve replacement and he was transferred to [facility] for further care.  The patient is currently chest pain free.  He does continue to be a little short of breath and is coughing a lot.  He had a low grade temperature overnight.  He remains on tamiflu and states he is feeling somewhat better since admission.  His smoking history has been 1 ppd for the past 50 years.  Current Activity/ Functional Status: Patient was independent with mobility/ambulation, transfers, ADL's, IADL's.   Zubrod Score: At the time of surgery this patient's most appropriate activity status/level should be described as: []     0    Normal activity, no  symptoms [x]     1    Restricted in physical strenuous activity but ambulatory, able to do out light work []     2    Ambulatory and capable of self care, unable to do work activities, up and about                 more than 50%  Of the time                            []     3    Only limited self care, in bed greater than 50% of waking hours []     4    Completely disabled, no self care, confined to bed or chair []     5    Moribund  Past Medical History:  Diagnosis Date  . Bilateral carotid bruits    a. 01/2018 U/S: < 50% bilat ICA stenosis.  Marland Kitchen CAD (coronary artery disease)    a. 1998 s/p MI and BMS [facility], [place]); b. 1999 redo PCI/rotablator in setting of what sounds like ISR;  c. Multiple stress tests over the years - last ~ 2017, reportedly nl; d. 01/2018 NSTEMI/Cath: LM 75m/d, LAD 50p, 40p/m, D1 60ost, OM1 95, RCA 100ost/p w/ L->R collats, EF 45%.  . Chronic bronchitis (HCC)   . Chronic lower back pain   .  COPD (chronic obstructive pulmonary disease) (HCC)   . Heart murmur   . HTN (hypertension)   . Hypercholesteremia   . Ischemic cardiomyopathy    a. 01/2018 Echo: EF 40-45%, mid-apicalanteroseptal, ant, apical sev HK, mod apicalinferior HK. Gr2 DD. Mod AS, mild MR, mod dil LA, PASP .  . Moderate aortic stenosis    a. 01/2018 Echo: Mod AS, mean grad (S) , Valve area (VTI) 1.06 cm^2, (Vmax) 1.27 cm^2.  . Myocardial infarction (HCC) ~ 1998/1999  . Tobacco abuse   . Type II diabetes mellitus (HCC)     Past Surgical History:  Procedure Laterality Date  . COLONOSCOPY    . CORONARY ANGIOPLASTY WITH STENT PLACEMENT  ~ 1998/1999  . LEFT HEART CATH AND CORONARY ANGIOGRAPHY N/A 02/06/2018   Procedure: LEFT HEART CATH AND CORONARY ANGIOGRAPHY;  Surgeon: Iran Ouch, MD;  Location: [facility] INVASIVE CV LAB;  Service: Cardiovascular;  Laterality: N/A;  . TONSILLECTOMY      Social History   Tobacco Use  Smoking Status Former Smoker  . Packs/day: 1.00  . Years: 15.00  . Pack  years: 15.00  . Types: Cigarettes  . Last attempt to quit: 02/02/2018  . Years since quitting: 0.0  Smokeless Tobacco Never Used    Social History   Substance and Sexual Activity  Alcohol Use No  . Frequency: Never    Social History   Socioeconomic History  . Marital status: Widowed    Spouse name: Not on file  . Number of children: Not on file  . Years of education: Not on file  . Highest education level: Not on file  Social Needs  . Financial resource strain: Not on file  . Food insecurity - worry: Not on file  . Food insecurity - inability: Not on file  . Transportation needs - medical: Not on file  . Transportation needs - non-medical: Not on file  Occupational History  . Not on file  Tobacco Use  . Smoking status: Former Smoker    Packs/day: 1.00    Years: 15.00    Pack years: 15.00    Types: Cigarettes    Last attempt to quit: 02/02/2018    Years since quitting: 0.0  . Smokeless tobacco: Never Used  Substance and Sexual Activity  . Alcohol use: No    Frequency: Never  . Drug use: No  . Sexual activity: Yes  Other Topics Concern  . Not on file  Social History Narrative   Lives in [place] by himself.  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  Fairly active but doesn't routinely exercise.    No Known Allergies  Current Facility-Administered Medications  Medication Dose Route Frequency Provider Last Rate Last Dose  . 0.9 %  sodium chloride infusion  250 mL Intravenous PRN Creig Hines, NP      . acetaminophen (TYLENOL) tablet 650 mg  650 mg Oral Q4H PRN Creig Hines, NP      . ALPRAZolam Prudy Feeler) tablet 0.5 mg  0.5 mg Oral BID PRN Bhagat, Bhavinkumar, PA   0.5 mg at 02/08/18 1041  . amLODipine (NORVASC) tablet 5 mg  5 mg Oral Daily Creig Hines, NP   5 mg at 02/08/18 1610  . aspirin EC tablet 81 mg  81 mg Oral Daily Creig Hines, NP   81 mg at 02/08/18 0935  . atorvastatin (LIPITOR)  tablet 80 mg  80 mg Oral q1800 Creig Hines, NP   80 mg at  02/07/18 1724  . Chlorhexidine Gluconate Cloth 2 % PADS 6 each  6 each Topical Daily Lennette Bihari, MD   6 each at 02/08/18 [phone]  . ezetimibe (ZETIA) tablet 10 mg  10 mg Oral Daily Creig Hines, NP   10 mg at 02/08/18 0935  . furosemide (LASIX) injection 20 mg  20 mg Intravenous BID Creig Hines, NP   20 mg at 02/08/18 0748  . heparin ADULT infusion 100 units/mL (25000 units/219mL sodium chloride 0.45%)  1,350 Units/hr Intravenous Continuous Norva Pavlov, RPH 13.5 mL/hr at 02/07/18 2256 1,350 Units/hr at 02/07/18 2256  . insulin aspart (novoLOG) injection 0-15 Units  0-15 Units Subcutaneous TID WC Creig Hines, NP   2 Units at 02/08/18 [phone]  . ipratropium-albuterol (DUONEB) 0.5-2.5 (3) MG/3ML nebulizer solution 3 mL  3 mL Nebulization Q6H PRN Creig Hines, NP   3 mL at 02/08/18 0122  . ipratropium-albuterol (DUONEB) 0.5-2.5 (3) MG/3ML nebulizer solution 3 mL  3 mL Nebulization TID Lennette Bihari, MD   3 mL at 02/08/18 0747  . isosorbide mononitrate (IMDUR) 24 hr tablet 60 mg  60 mg Oral Daily Creig Hines, NP   60 mg at 02/08/18 4782  . losartan (COZAAR) tablet 100 mg  100 mg Oral Daily Creig Hines, NP   100 mg at 02/08/18 0935  . metoprolol tartrate (LOPRESSOR) tablet 25 mg  25 mg Oral BID Creig Hines, NP   25 mg at 02/08/18 0935  . mupirocin ointment (BACTROBAN) 2 % 1 application  1 application Nasal BID Lennette Bihari, MD   1 application at 02/08/18 [phone]  . nicotine (NICODERM CQ - dosed in mg/24 hours) patch 14 mg  14 mg Transdermal Daily Bhagat, Bhavinkumar, PA   14 mg at 02/08/18 1040  . nitroGLYCERIN (NITROSTAT) SL tablet 0.4 mg  0.4 mg Sublingual Q5 Min x 3 PRN Creig Hines, NP      . ondansetron [facility]) injection 4 mg  4 mg Intravenous Q6H PRN Creig Hines, NP      . oseltamivir (TAMIFLU)  capsule 75 mg  75 mg Oral BID Creig Hines, NP   75 mg at 02/08/18 0935  . potassium chloride SA (K-DUR,KLOR-CON) CR tablet 20 mEq  20 mEq Oral BID Turner, Traci R, MD      . potassium chloride SA (K-DUR,KLOR-CON) CR tablet 40 mEq  40 mEq Oral Once Quintella Reichert, MD      . Melene Muller ON 02/11/2018] predniSONE (DELTASONE) tablet 10 mg  10 mg Oral Once Creig Hines, NP      . Melene Muller ON 02/10/2018] predniSONE (DELTASONE) tablet 20 mg  20 mg Oral Once Creig Hines, NP      . Melene Muller ON 02/09/2018] predniSONE (DELTASONE) tablet 30 mg  30 mg Oral Once Creig Hines, NP      . sodium chloride flush (NS) 0.9 % injection 3 mL  3 mL Intravenous Q12H Creig Hines, NP   3 mL at 02/08/18 1308  . sodium chloride flush (NS) 0.9 % injection 3 mL  3 mL Intravenous PRN Creig Hines, NP        Medications Prior to Admission  Medication Sig Dispense Refill Last Dose  . amLODipine (NORVASC) 2.5 MG tablet Take 2.5 mg by mouth 2 (two) times daily.   02/04/2018 at PM  . Ascorbic Acid (VITAMIN C) 1000 MG tablet Take 1,000 mg by mouth daily.   02/04/2018  at PM  . aspirin 325 MG EC tablet Take 325 mg by mouth daily.   02/04/2018 at AM  . atorvastatin (LIPITOR) 40 MG tablet Take 1 tablet (40 mg total) by mouth daily at 6 PM.     . chlorpheniramine-HYDROcodone (TUSSIONEX PENNKINETIC ER) 10-8 MG/5ML SUER Take 5 mLs by mouth 2 (two) times daily. 140 mL 0 N/A at N/A  . ezetimibe (ZETIA) 10 MG tablet Take 10 mg by mouth daily.   02/04/2018 at PM  . folic acid (FOLVITE) 400 MCG tablet Take 400 mcg by mouth every evening.   02/04/2018 at PM  . furosemide (LASIX) 10 MG/ML injection Inject 2 mLs (20 mg total) into the vein every 12 (twelve) hours. 4 mL 0   . heparin 100-0.45 UNIT/ML-% infusion Inject 1,200 Units/hr into the vein continuous. 250 mL    . ipratropium-albuterol (DUONEB) 0.5-2.5 (3) MG/3ML SOLN Take 3 mLs by nebulization every 6 (six) hours as needed. 360  mL 1 PRN at PRN  . isosorbide mononitrate (IMDUR) 60 MG 24 hr tablet Take 60 mg by mouth daily.   02/04/2018 at AM  . losartan (COZAAR) 100 MG tablet Take 1 tablet (100 mg total) by mouth daily.     . Nutritional Supplements (GRAPESEED EXTRACT) 500-50 MG CAPS Take 1 tablet by mouth 2 (two) times daily.   02/04/2018 at PM  . oseltamivir (TAMIFLU) 75 MG capsule Take 1 capsule (75 mg total) by mouth 2 (two) times daily for 5 days. 10 capsule 0 N/A at N/A  . predniSONE (DELTASONE) 50 MG tablet taper     . Resveratrol 250 MG CAPS Take 500 mg by mouth 2 (two) times daily.   02/04/2018 at PM  . VITAMIN D, CHOLECALCIFEROL, PO Take by mouth every morning.   N/A at N/A    Family History  Problem Relation Age of Onset  . Lymphoma Mother   . Peripheral vascular disease Father     Review of Systems:   ROS Constitutional: positive for chills, fevers and sweats Ears, nose, mouth, throat, and face: negative Respiratory: positive for cough and COPD Cardiovascular: positive for chest pain Gastrointestinal: negative Musculoskeletal:positive for body aches, resolving Neurological: negative     Cardiac Review of Systems: Y or  [    ]= no  Chest Pain [ Y   ]  Resting SOB [ Y  ] Exertional SOB  [Y  ]  Orthopnea [  ]   Pedal Edema [ N  ]    Palpitations [  ] Syncope  [  ]   Presyncope [   ]  General Review of Systems: [Y] = yes [  ]=no Constitional: recent weight change [  ]; anorexia [  ]; fatigue [  ]; nausea [  ]; night sweats [  ]; fever [Y  ]; or chills [ Y ]                                                               Dental: poor dentition[  ]; Last Dentist visit:   Eye : blurred vision [  ]; diplopia [   ]; vision changes [  ];  Amaurosis fugax[  ]; Resp: cough Gilian.Kraft  ];  wheezing[ Y ];  hemoptysis[  ]; shortness of breath[ Y ];  paroxysmal nocturnal dyspnea[  ]; dyspnea on exertion[  ]; or orthopnea[  ];  GI:  gallstones[  ], vomiting[  ];  dysphagia[  ]; melena[  ];  hematochezia [  ]; heartburn[  ];    Hx of  Colonoscopy[  ]; GU: kidney stones [  ]; hematuria[  ];   dysuria [  ];  nocturia[  ];  history of     obstruction [  ]; urinary frequency [  ]             Skin: rash, swelling[  ];, hair loss[  ];  peripheral edema[  ];  or itching[  ]; Musculosketetal: myalgias[Y  ];  joint swelling[  ];  joint erythema[  ];  joint pain[  ];  back pain[  ];  Heme/Lymph: bruising[  ];  bleeding[  ];  anemia[  ];  Neuro: TIA[  ];  headaches[  ];  stroke[  ];  vertigo[  ];  seizures[  ];   paresthesias[  ];  difficulty walking[  ];  Psych:depression[  ]; anxiety[  ];  Endocrine: diabetes[  ];  thyroid dysfunction[  ];  Immunizations: Flu [  ]; Pneumococcal[  ];    Physical Exam: BP (!) 183/83   Pulse 91   Temp 98.1 F (36.7 C) (Oral)   Resp 20   Ht 5\' 6"  (1.676 m)   Wt 165 lb 11.2 oz (75.2 kg)   SpO2 93%   BMI 26.74 kg/m    General appearance: alert, cooperative and no distress Head: Normocephalic, without obvious abnormality, atraumatic Neck: no adenopathy, no carotid bruit, no JVD, supple, symmetrical, trachea midline and thyroid not enlarged, symmetric, no tenderness/mass/nodules Lymph nodes: Cervical, supraclavicular, and axillary nodes normal. Resp: rales bilaterally Cardio: regular rate and rhythm GI: soft, non-tender; bowel sounds normal; no masses,  no organomegaly Extremities: extremities normal, atraumatic, no cyanosis or edema Neurologic: Grossly normal  Diagnostic Studies & Laboratory data:     Recent Radiology Findings:   US Carotid Bilateral  Result Date: 02/07/2018 CLINICAL DATA:  Asymptomatic bilateral carotid bruits EXAM: BILATERAL CAROTID DUPLEX ULTRASOUND TECHNIQUE: Wallace Cullens scale imaging, color Doppler and duplex ultrasound were performed of bilateral carotid and vertebral arteries in the neck. COMPARISON:  None. FINDINGS: Criteria: Quantification of carotid stenosis is based on velocity parameters that correlate the residual internal carotid diameter with NASCET-based  stenosis levels, using the diameter of the distal internal carotid lumen as the denominator for stenosis measurement. The following velocity measurements were obtained: RIGHT ICA:  100/17 cm/sec CCA:  79/12 cm/sec SYSTOLIC ICA/CCA RATIO:  1.3 DIASTOLIC ICA/CCA RATIO:  1.5 ECA:  190 cm/sec LEFT ICA:  121/22 cm/sec CCA:  99/15 cm/sec SYSTOLIC ICA/CCA RATIO:  1.2 DIASTOLIC ICA/CCA RATIO:  1.5 ECA:  190 cm/sec RIGHT CAROTID ARTERY: Moderate diffuse mixed echogenicity carotid atherosclerosis. Despite this, no hemodynamically significant right ICA stenosis, velocity elevation, or turbulent flow. Degree of narrowing less than 50% by ultrasound criteria. RIGHT VERTEBRAL ARTERY:  Antegrade LEFT CAROTID ARTERY: Similar moderate diffuse mixed echogenicity atherosclerosis. Despite this, no hemodynamically significant left ICA stenosis, velocity elevation, or turbulent flow. LEFT VERTEBRAL ARTERY:  Antegrade IMPRESSION: Moderate bilateral carotid atherosclerosis. No hemodynamically significant ICA stenosis by ultrasound. Degree of narrowing less than 50% bilaterally by ultrasound criteria. Patent antegrade vertebral flow bilaterally Electronically Signed   By: Judie Petit.  Shick M.D.   On: 02/07/2018 10:47     Transthoracic Echocardiography  Patient:    Zair, Borawski MR #:  [id number] Study Date: 02/06/2018 Gender:     M Age:        31 Height:     167.6 cm Weight:     76.5 kg BSA:        1.9 m^2 Pt. Status: Room:   ADMITTING    Imogene Burn, Alfredo Batty Md  ATTENDING    Shaune Pollack Md  Dalbert Garnet, [place] Md  REFERRING    Shaune Pollack Md  PERFORMING   [facility]  SONOGRAPHER  Humphrey Rolls, RDCS  cc:  ------------------------------------------------------------------- LV EF: 40% -   45%  ------------------------------------------------------------------- Indications:      Elevated Troponin.  ------------------------------------------------------------------- History:   PMH:  Hypercholesteremia.  Coronary artery  disease. Chronic obstructive pulmonary disease.  Risk factors: Hypertension. Diabetes mellitus.  ------------------------------------------------------------------- Study Conclusions  - Left ventricle: The cavity size was normal. There was mild   concentric hypertrophy. Systolic function was mildly to   moderately reduced. The estimated ejection fraction was in the   range of 40% to 45%. Severe hypokinesis of the   mid-apicalanteroseptal, anterior, and apical myocardium. Moderate   hypokinesis of the apicalinferior myocardium. Features are   consistent with a pseudonormal left ventricular filling pattern,   with concomitant abnormal relaxation and increased filling   pressure (grade 2 diastolic dysfunction). - Aortic valve: There was moderate stenosis. Mean gradient (S): 20   mm Hg. Valve area (VTI): 1.06 cm^2. Valve area (Vmax): 1.27 cm^2. - Mitral valve: Calcified annulus. There was mild regurgitation.   Valve area by continuity equation (using LVOT flow): 2.62 cm^2. - Left atrium: The atrium was moderately dilated. - Pulmonary arteries: Systolic pressure was moderately increased.   PA peak pressure: 50 mm Hg (S).  ------------------------------------------------------------------- Study data:   Study status:  Routine.  Procedure:  The patient reported no pain pre or post test. Transthoracic echocardiography. Image quality was good.          Transthoracic echocardiography. M-mode, complete 2D, spectral Doppler, and color Doppler. Birthdate:  Patient birthdate: 1946/09/18.  Age:  Patient is 72 yr old.  Sex:  Gender: male.    BMI: 27.2 kg/m^2.  Blood pressure: 154/55  Patient status:  Inpatient.  Study date:  Study date: 02/06/2018. Study time: 09:13 AM.  Location:  Bedside.  -------------------------------------------------------------------  ------------------------------------------------------------------- Left ventricle:  The cavity size was normal. There was  mild concentric hypertrophy. Systolic function was mildly to moderately reduced. The estimated ejection fraction was in the range of 40% to 45%.  Regional wall motion abnormalities:   Severe hypokinesis of the mid-apicalanteroseptal, anterior, and apical myocardium. Moderate hypokinesis of the apicalinferior myocardium. Features are consistent with a pseudonormal left ventricular filling pattern, with concomitant abnormal relaxation and increased filling pressure (grade 2 diastolic dysfunction).  ------------------------------------------------------------------- Aortic valve:   Trileaflet; moderately thickened, severely calcified leaflets.  Doppler:   There was moderate stenosis. There was no regurgitation.    Valve area (VTI): 1.06 cm^2. Indexed valve area (VTI): 0.56 cm^2/m^2. Peak velocity ratio of LVOT to aortic valve: 0.33. Valve area (Vmax): 1.27 cm^2. Indexed valve area (Vmax): 0.67 cm^2/m^2.    Mean gradient (S): 20 mm Hg. Peak gradient (S): 32 mm Hg.  ------------------------------------------------------------------- Aorta:  Aortic root: The aortic root was normal in size.  ------------------------------------------------------------------- Mitral valve:   Calcified annulus. Mobility was not restricted. Doppler:  Transvalvular velocity was within the normal range. There was no evidence for stenosis. There was mild regurgitation. Valve area by pressure half-time: 3.79 cm^2. Indexed valve area  by pressure half-time: 1.99 cm^2/m^2. Valve area by continuity equation (using LVOT flow): 2.62 cm^2. Indexed valve area by continuity equation (using LVOT flow): 1.38 cm^2/m^2.    Mean gradient (D): 2 mm Hg. Peak gradient (D): 5 mm Hg.  ------------------------------------------------------------------- Left atrium:  The atrium was moderately dilated.  ------------------------------------------------------------------- Right ventricle:  The cavity size was normal. Wall  thickness was normal. Systolic function was normal.  ------------------------------------------------------------------- Pulmonic valve:    Doppler:  Transvalvular velocity was within the normal range. There was no evidence for stenosis.  ------------------------------------------------------------------- Tricuspid valve:   Structurally normal valve.    Doppler: Transvalvular velocity was within the normal range. There was trivial regurgitation.  ------------------------------------------------------------------- Pulmonary artery:   The main pulmonary artery was normal-sized. Systolic pressure was moderately increased.  ------------------------------------------------------------------- Right atrium:  The atrium was normal in size.  ------------------------------------------------------------------- Pericardium:  There was no pericardial effusion.  ------------------------------------------------------------------- Systemic veins: Inferior vena cava: The vessel was normal in size.  ------------------------------------------------------------------- Measurements   Left ventricle                           Value          Reference  LV ID, ED, PLAX chordal                  44.8  mm       43 - 52  LV ID, ES, PLAX chordal                  33.4  mm       23 - 38  LV fx shortening, PLAX chordal   (L)     25    %        >=29  LV PW thickness, ED                      11.9  mm       ----------  IVS/LV PW ratio, ED                      0.63           <=1.3  Stroke volume, 2D                        71    ml       ----------  Stroke volume/bsa, 2D                    37    ml/m^2   ----------  LV ejection fraction, 1-p A4C            49    %        ----------  LV end-diastolic volume, 2-p             145   ml       ----------  LV end-systolic volume, 2-p              80    ml       ----------  LV ejection fraction, 2-p                45    %        ----------  Stroke volume, 2-p                        65  ml       ----------  LV end-diastolic volume/bsa, 2-p         76    ml/m^2   ----------  LV end-systolic volume/bsa, 2-p          42    ml/m^2   ----------  Stroke volume/bsa, 2-p                   34.2  ml/m^2   ----------  LV e&', lateral                           11.4  cm/s     ----------  LV E/e&', lateral                         9.56           ----------  LV e&', medial                            6.96  cm/s     ----------  LV E/e&', medial                          15.66          ----------  LV e&', average                           9.18  cm/s     ----------  LV E/e&', average                         11.87          ----------    Ventricular septum                       Value          Reference  IVS thickness, ED                        7.53  mm       ----------    LVOT                                     Value          Reference  LVOT ID, S                               22    mm       ----------  LVOT area                                3.8   cm^2     ----------  LVOT peak velocity, S                    95.1  cm/s     ----------  LVOT mean velocity, S                    61.2  cm/s     ----------  LVOT VTI, S  18.7  cm       ----------    Aortic valve                             Value          Reference  Aortic valve peak velocity, S            285   cm/s     ----------  Aortic mean gradient, S                  20    mm Hg    ----------  Aortic peak gradient, S                  32    mm Hg    ----------  Aortic valve area, VTI                   1.06  cm^2     ----------  Aortic valve area/bsa, VTI               0.56  cm^2/m^2 ----------  Velocity ratio, peak, LVOT/AV            0.33           ----------  Aortic valve area, peak velocity         1.27  cm^2     ----------  Aortic valve area/bsa, peak              0.67  cm^2/m^2 ----------  velocity    Aorta                                    Value          Reference  Aortic root ID,  ED                       30    mm       ----------    Left atrium                              Value          Reference  LA ID, A-P, ES                           47    mm       ----------  LA ID/bsa, A-P                   (H)     2.47  cm/m^2   <=2.2  LA volume, S                             66.6  ml       ----------  LA volume/bsa, S                         35    ml/m^2   ----------  LA volume, ES, 1-p A4C                   58    ml       ----------  LA volume/bsa,  ES, 1-p A4C               30.5  ml/m^2   ----------  LA volume, ES, 1-p A2C                   71.9  ml       ----------  LA volume/bsa, ES, 1-p A2C               37.8  ml/m^2   ----------    Mitral valve                             Value          Reference  Mitral E-wave peak velocity              109   cm/s     ----------  Mitral A-wave peak velocity              107   cm/s     ----------  Mitral mean velocity, D                  73.5  cm/s     ----------  Mitral deceleration time                 187   ms       150 - 230  Mitral pressure half-time                58    ms       ----------  Mitral mean gradient, D                  2     mm Hg    ----------  Mitral peak gradient, D                  5     mm Hg    ----------  Mitral E/A ratio, peak                   1              ----------  Mitral valve area, PHT, DP               3.79  cm^2     ----------  Mitral valve area/bsa, PHT, DP           1.99  cm^2/m^2 ----------  Mitral valve area, LVOT                  2.62  cm^2     ----------  continuity  Mitral valve area/bsa, LVOT              1.38  cm^2/m^2 ----------  continuity  Mitral annulus VTI, D                    27.1  cm       ----------    Pulmonary veins                          Value          Reference  Pulmonary vein peak velocity, S          62    cm/s     ----------  Pulmonary vein peak velocity, D          43  cm/s     ----------  Pulmonary vein velocity ratio,           1.5            ----------  peak,  S/D    Pulmonary arteries                       Value          Reference  PA pressure, S, DP               (H)     50    mm Hg    <=30    Right atrium                             Value          Reference  RA ID, S-I, ES, A4C              (H)     49.8  mm       34 - 49  RA area, ES, A4C                         18.3  cm^2     8.3 - 19.5  RA volume, ES, A/L                       56.1  ml       ----------  RA volume/bsa, ES, A/L                   29.5  ml/m^2   ----------    Right ventricle                          Value          Reference  RV ID, ED, PLAX                          36.6  mm       19 - 38  TAPSE                                    23.8  mm       ----------  RV pressure, S, DP               (H)     55    mm Hg    <=30  RV s&', lateral, S                        12.2  cm/s     ----------    Pulmonic valve                           Value          Reference  Pulmonic valve peak velocity, S          96.5  cm/s     ----------  Pulmonic acceleration time               90    ms       ----------  Legend: (L)  and  (H)  mark values  outside specified reference range.  ------------------------------------------------------------------- Prepared and Electronically Authenticated by  [name], MD 2019-02-21T12:38:43    LEFT HEART CATH AND CORONARY ANGIOGRAPHY  Conclusion     There is mild to moderate left ventricular systolic dysfunction.  LV end diastolic pressure is severely elevated.  Ost RCA to Prox RCA lesion is 100% stenosed.  Mid LM to Dist LM lesion is 85% stenosed.  Ost 1st Mrg lesion is 95% stenosed.  Prox LAD to Mid LAD lesion is 40% stenosed.  Prox LAD lesion is 50% stenosed.  Ost 1st Diag to 1st Diag lesion is 60% stenosed.   1.  Severe left main and three-vessel coronary artery disease with chronically occluded right coronary artery with left-to-right collaterals.  The coronary arteries are overall heavily calcified and diffusely diseased. 2.   Mildly reduced LV systolic function with an EF of 40%.  Echocardiogram showed wall motion abnormalities in the anterior as well as inferior walls.  3.  Severely elevated filling pressures in the setting of severely elevated blood pressure. 4.  Moderate aortic stenosis with a peak to peak gradient of 20 mmHg.  Recommendations: Transfer to [facility] for CABG and aortic valve replacement.  The patient will need to be tuned up given flu.  He also appears to be volume overloaded and blood pressure is uncontrolled.  I added losartan and will diuresis with furosemide. Heparin drip to be resumed in 8 hours.   Indications   NSTEMI (non-ST elevated myocardial infarction) (HCC) [I21.4 (ICD-10-CM)]  Procedural Details/Technique   Technical Details Procedural Details: The right wrist was prepped, draped, and anesthetized with 1% lidocaine. Using the modified Seldinger technique, a 5 French sheath was introduced into the right radial artery. 2.5 mg of verapamil was administered through the sheath, weight-based unfractionated heparin was administered intravenously. A Jackie catheter was used for selective coronary angiography and left ventriculography. Catheter exchanges were performed over an exchange length guidewire. There were no immediate procedural complications. A TR band was used for radial hemostasis at the completion of the procedure. The patient was transferred to the post catheterization recovery area for further monitoring.   Estimated blood loss <50 mL.  During this procedure the patient was administered the following to achieve and maintain moderate conscious sedation: Versed 1 mg, Fentanyl 50 mcg, while the patient's heart rate, blood pressure, and oxygen saturation were continuously monitored. The period of conscious sedation was 13 minutes, of which I was present face-to-face 100% of this time.  Coronary Findings   Diagnostic  Dominance: Right  Left Main  Mid LM to Dist LM lesion 85% stenosed   Mid LM to Dist LM lesion is 85% stenosed. The lesion is eccentric. The lesion is severely calcified.  Left Anterior Descending  Prox LAD lesion 50% stenosed  Prox LAD lesion is 50% stenosed.  Prox LAD to Mid LAD lesion 40% stenosed  Prox LAD to Mid LAD lesion is 40% stenosed. The lesion was previously treated.  First Diagonal Branch  Ost 1st Diag to 1st Diag lesion 60% stenosed  Ost 1st Diag to 1st Diag lesion is 60% stenosed.  Left Circumflex  Vessel is small. There is severe diffuse disease throughout the vessel.  First Obtuse Marginal Branch  Vessel is large in size.  Ost 1st Mrg lesion 95% stenosed  Ost 1st Mrg lesion is 95% stenosed.  Right Coronary Artery  Ost RCA to Prox RCA lesion 100% stenosed  Ost RCA to Prox RCA lesion is 100% stenosed.  Right Posterior Atrioventricular Branch  Collaterals  Post Atrio filled by collaterals from 2nd Sept.    Intervention   No interventions have been documented.  Wall Motion              Left Heart   Left Ventricle The left ventricle is mildly dilated. There is mild to moderate left ventricular systolic dysfunction. LV end diastolic pressure is severely elevated. Calculated EF is 45%. There are LV function abnormalities due to segmental dysfunction.  Coronary Diagrams   Diagnostic Diagram       Implants     No implant documentation for this case.  MERGE Images   Show images for CARDIAC CATHETERIZATION   Link to Procedure Log   Procedure Log    Hemo Data 02/06/18 1210--02/06/18 1241 before discharge   AO Systolic Cath Pressure AO Diastolic Cath Pressure AO Mean Cath Pressure LV Systolic Cath Pressure LV End Diastolic  - - - 197 mmHg 43 mmHg  - - - 190 mmHg 41 mmHg  - - - 194 mmHg 40 mmHg  169 66 mmHg 105 mmHg - -  172 70 mmHg 106 mmHg - -     I have independently reviewed the above radiologic studies.  Recent Lab Findings: Lab Results  Component Value Date   WBC 12.0 (H) 02/08/2018   HGB 15.5 02/08/2018    HCT 44.7 02/08/2018   PLT 235 02/08/2018   GLUCOSE 150 (H) 02/08/2018   CHOL 139 02/06/2018   TRIG 116 02/06/2018   HDL 42 02/06/2018   LDLCALC 74 02/06/2018   ALT 25 02/03/2018   AST 54 (H) 02/03/2018   NA 137 02/08/2018   K 3.2 (L) 02/08/2018   CL 96 (L) 02/08/2018   CREATININE 0.75 02/08/2018   BUN 24 (H) 02/08/2018   CO2 29 02/08/2018   INR 0.99 02/06/2018   HGBA1C 7.0 (H) 02/05/2018   Assessment / Plan:      1. CAD- LM involvement per cath, patient is currently chest pain free.. H/O of CAD with stent placed 10 years ago 2. Aortic Stenosis- moderate on Echocardiogram 3. Influenza A- currently on Tamiflu, Steroids- low grade temp overnight, continues to have productive cough, rales on exam 4. CV- HTN- uncontrolled, SBP in the 180s this morning 5. 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 Dispo- patient with CAD and Aortic Stenosis- will need CABG with possible AVR in near future.Marland Kitchen However with Influenza A would be beneficial to postpone until patient has recovered from the flu and his respiratory status has improved... Dr. Cornelius Moras will review catheterization, Echocardiogram and follow up with further recommendations.   Lowella Dandy, PA-C  02/08/2018 10:41 AM   I have seen and examined the patient and agree with the assessment and plan as outlined above by Erin Barrett.  Patient is a 72 year old male with known history of coronary artery disease, hypertension, hyperlipidemia, type 2 diabetes mellitus, and COPD with ongoing tobacco abuse and chronic bronchitis who describes a 75-month history of symptoms of exertional shortness of breath and epigastric chest pain attributed to reflux followed by somewhat acute exacerbation of severe resting shortness of breath in the setting of low-grade fever, productive cough, and flulike syndrome associated with positive titers for influenza A.  The patient presented with acute hypoxemia and chest x-ray with pulmonary edema, consistent with acute  combined systolic and diastolic congestive heart failure.  He ruled in for an acute non-ST segment elevation myocardial infarction with peak troponin I reaching 2.65.  Symptoms have improved with Tamiflu, steroids,  and intravenous diuresis.  The patient denies any symptoms of chest pain or chest tightness since hospital admission.  I have personally reviewed the patient's admission laboratory data, chest x-ray, transthoracic echocardiogram, and diagnostic cardiac catheterization.  Echocardiogram reveals moderate left ventricular systolic dysfunction with ejection fraction estimated 40-45%.  There was severe hypokinesis of the mid anterior and anteroapical septum.  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  The aortic valve leaflets are thickened, partially calcified, and demonstrate severely restricted leaflet mobility.  Peak velocity across the aortic valve measured a size 2.85 m/s corresponding to mean transvalvular gradient estimated 20 mmHg and aortic valve area calculated 1.06 cm.  The DVI was reported 0.33.  There was mild mitral regurgitation.  There were findings suggestive of increased pulmonary artery pressures.  Diagnostic cardiac catheterization revealed severe left main and three-vessel coronary artery disease.  There is 85% stenosis of the left main coronary artery.  There is diffuse long segment 50% stenosis of the left anterior descending coronary artery.  There is long segment 40% in-stent restenosis of the mid left anterior descending coronary artery.  There is 95% stenosis of the first obtuse marginal branch of the left circumflex coronary artery.  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  Terminal branches of the distal right coronary artery are not well visualized, but the patient appears to have codominant coronary circulation.  Peak to peak transvalvular gradient across the aortic valve was measured 20 mmHg.  Left ventricular  end-diastolic pressure was severely elevated.  Right heart catheterization was not performed for unclear reasons.  I agree the patient would best be treated with aortic valve replacement and coronary artery bypass grafting.  Risks associated with elective surgery will be increased because of the patient's acute presentation including acute systolic congestive heart failure with hypoxemic respiratory failure in conjunction with influenza and underlying significant COPD.  As long as the patient remained stable from a cardiac standpoint he would benefit from further medical tune up further prior surgery.  We will continue to follow along closely and potentially make plans for surgical intervention sometime later this week if the patient continues to improve.  Once his volume status has been improved further he will need cardiac gated CT angiogram of the heart to further evaluate the aortic valve and the functional anatomy of the aortic root.  Formal pulmonary function tests will be ordered.   I spent in excess of 120 minutes during the conduct of this initial hospital encounter and >50% of this time involved direct face-to-face encounter with the patient for counseling and/or coordination of their care.          Purcell Nails, MD 02/08/2018 2:40 PM

## 2018-02-08 NOTE — Progress Notes (Signed)
ANTICOAGULATION CONSULT NOTE - Follow Up Consult  Pharmacy Consult for Heparin Indication: chest pain/ACS  No Known Allergies  Patient Measurements: Height: 5\' 6"  (167.6 cm) Weight: 165 lb 11.2 oz (75.2 kg) IBW/kg (Calculated) : 63.8 Heparin Dosing Weight: 76.1 kg  Vital Signs: Temp: 99.2 F (37.3 C) (02/23 1346) Temp Source: Oral (02/23 1346) BP: 143/81 (02/23 1346) Pulse Rate: 83 (02/23 1346)  Labs: Recent Labs    02/05/18 1555 02/05/18 2106 02/06/18 0244 02/06/18 0848  02/07/18 0419 02/07/18 2125 02/08/18 0319 02/08/18 1112  HGB 15.4  --  14.5  --   --  15.6  --  15.5  --   HCT 45.5  --  42.9  --   --  44.6  --  44.7  --   PLT 205  --  204  --   --  205  --  235  --   APTT  --   --   --  49*  --   --   --   --   --   LABPROT  --   --   --  13.0  --   --   --   --   --   INR  --   --   --  0.99  --   --   --   --   --   HEPARINUNFRC  --   --   --   --    < > <0.10* 0.13* 0.40 0.25*  CREATININE 0.77  --  0.92  --   --  0.91  --  0.75  --   TROPONINI 0.36* 1.74* 2.65*  --   --   --   --   --   --    < > = values in this interval not displayed.    Estimated Creatinine Clearance: 74.2 mL/min (by C-G formula based on SCr of 0.75 mg/dL).  Assessment: 69 yoM consulted for heparin for ACS. Transfer from [facility] where he presented with pulmonary edema and NSTEMI. Now at [facility] for CVTS consult.  Heparin level subtherapeutic: 0.25 CBC WNL. No bleeding noted.  Goal of Therapy:  Heparin level 0.3-0.7 units/ml Monitor platelets by anticoagulation protocol: Yes   Plan:  Rebolus heparin 1,000 units x1 Increase infusion to 1500 units/hr Heparin level at 2100 Daily heparin level   Yani Coventry L. Marcy Salvo, PharmD, MS PGY1 Pharmacy Resident Pager: [phone]

## 2018-02-08 NOTE — Progress Notes (Addendum)
Progress Note  Patient Name: Harold Parker Date of Encounter: 02/08/2018  Primary Cardiologist: No primary care provider on file.   Subjective   Denies any CP or SOB  Inpatient Medications    Scheduled Meds: . amLODipine  5 mg Oral Daily  . aspirin EC  81 mg Oral Daily  . atorvastatin  80 mg Oral q1800  . Chlorhexidine Gluconate Cloth  6 each Topical Daily  . ezetimibe  10 mg Oral Daily  . furosemide  20 mg Intravenous BID  . insulin aspart  0-15 Units Subcutaneous TID WC  . ipratropium-albuterol  3 mL Nebulization TID  . isosorbide mononitrate  60 mg Oral Daily  . losartan  100 mg Oral Daily  . metoprolol tartrate  25 mg Oral BID  . mupirocin ointment  1 application Nasal BID  . nicotine  14 mg Transdermal Daily  . oseltamivir  75 mg Oral BID  . [START ON 02/11/2018] predniSONE  10 mg Oral Once  . [START ON 02/10/2018] predniSONE  20 mg Oral Once  . [START ON 02/09/2018] predniSONE  30 mg Oral Once  . sodium chloride flush  3 mL Intravenous Q12H   Continuous Infusions: . sodium chloride    . heparin 1,350 Units/hr (02/07/18 2256)   PRN Meds: sodium chloride, acetaminophen, ALPRAZolam, ipratropium-albuterol, nitroGLYCERIN, ondansetron (ZOFRAN) IV, sodium chloride flush   Vital Signs    Vitals:   02/07/18 2102 02/08/18 0123 02/08/18 0446 02/08/18 0800  BP: (!) 177/89  132/70 (!) 183/83  Pulse: 82 66 66 91  Resp:  (!) 22 20   Temp:   98.1 F (36.7 C)   TempSrc:   Oral   SpO2:  96% 93% 93%  Weight:   165 lb 11.2 oz (75.2 kg)   Height:        Intake/Output Summary (Last 24 hours) at 02/08/2018 1022 Last data filed at 02/08/2018 0910 Gross per 24 hour  Intake 534.9 ml  Output 1400 ml  Net -865.1 ml   Filed Weights   02/07/18 1620 02/07/18 1832 02/08/18 0446  Weight: 167 lb 11.2 oz (76.1 kg) 167 lb 11.2 oz (76.1 kg) 165 lb 11.2 oz (75.2 kg)    Telemetry    NSR - Personally Reviewed  ECG    No new EKG to review - Personally Reviewed  Physical Exam    GEN: No acute distress.   Neck: No JVD Cardiac: RRR, no murmurs, rubs, or gallops.  Respiratory: Clear to auscultation bilaterally. GI: Soft, nontender, non-distended  MS: No edema; No deformity. Neuro:  Nonfocal  Psych: Normal affect   Labs    Chemistry Recent Labs  Lab 02/03/18 0950  02/06/18 0244 02/07/18 0419 02/08/18 0319  NA 137   < > 139 139 137  K 4.3   < > 5.0 4.2 3.2*  CL 105   < > 101 102 96*  CO2 22   < > 30 29 29   GLUCOSE 167*   < > 306* 217* 150*  BUN 22*   < > 30* 30* 24*  CREATININE 0.84   < > 0.92 0.91 0.75  CALCIUM 8.7*   < > 8.5* 8.5* 8.6*  PROT 7.1  --   --   --   --   ALBUMIN 3.9  --   --   --   --   AST 54*  --   --   --   --   ALT 25  --   --   --   --  ALKPHOS 45  --   --   --   --   BILITOT 0.9  --   --   --   --   GFRNONAA >60   < > >60 >60 >60  GFRAA >60   < > >60 >60 >60  ANIONGAP 10   < > 8 8 12    < > = values in this interval not displayed.     Hematology Recent Labs  Lab 02/06/18 0244 02/07/18 0419 02/08/18 0319  WBC 5.2 9.0 12.0*  RBC 4.75 5.03 5.01  HGB 14.5 15.6 15.5  HCT 42.9 44.6 44.7  MCV 90.3 88.8 89.2  MCH 30.5 31.0 30.9  MCHC 33.7 34.9 34.7  RDW 14.0 13.5 13.3  PLT 204 205 235    Cardiac Enzymes Recent Labs  Lab 02/05/18 1555 02/05/18 2106 02/06/18 0244  TROPONINI 0.36* 1.74* 2.65*   No results for input(s): TROPIPOC in the last 168 hours.   BNPNo results for input(s): BNP, PROBNP in the last 168 hours.   DDimer No results for input(s): DDIMER in the last 168 hours.   Radiology    US Carotid Bilateral  Result Date: 02/07/2018 CLINICAL DATA:  Asymptomatic bilateral carotid bruits EXAM: BILATERAL CAROTID DUPLEX ULTRASOUND TECHNIQUE: Wallace Cullens scale imaging, color Doppler and duplex ultrasound were performed of bilateral carotid and vertebral arteries in the neck. COMPARISON:  None. FINDINGS: Criteria: Quantification of carotid stenosis is based on velocity parameters that correlate the residual internal  carotid diameter with NASCET-based stenosis levels, using the diameter of the distal internal carotid lumen as the denominator for stenosis measurement. The following velocity measurements were obtained: RIGHT ICA:  100/17 cm/sec CCA:  79/12 cm/sec SYSTOLIC ICA/CCA RATIO:  1.3 DIASTOLIC ICA/CCA RATIO:  1.5 ECA:  190 cm/sec LEFT ICA:  121/22 cm/sec CCA:  99/15 cm/sec SYSTOLIC ICA/CCA RATIO:  1.2 DIASTOLIC ICA/CCA RATIO:  1.5 ECA:  190 cm/sec RIGHT CAROTID ARTERY: Moderate diffuse mixed echogenicity carotid atherosclerosis. Despite this, no hemodynamically significant right ICA stenosis, velocity elevation, or turbulent flow. Degree of narrowing less than 50% by ultrasound criteria. RIGHT VERTEBRAL ARTERY:  Antegrade LEFT CAROTID ARTERY: Similar moderate diffuse mixed echogenicity atherosclerosis. Despite this, no hemodynamically significant left ICA stenosis, velocity elevation, or turbulent flow. LEFT VERTEBRAL ARTERY:  Antegrade IMPRESSION: Moderate bilateral carotid atherosclerosis. No hemodynamically significant ICA stenosis by ultrasound. Degree of narrowing less than 50% bilaterally by ultrasound criteria. Patent antegrade vertebral flow bilaterally Electronically Signed   By: Judie Petit.  Shick M.D.   On: 02/07/2018 10:47    Cardiac Studies   2D echo 01/2018 Study Conclusions  - Left ventricle: The cavity size was normal. There was mild   concentric hypertrophy. Systolic function was mildly to   moderately reduced. The estimated ejection fraction was in the   range of 40% to 45%. Severe hypokinesis of the   mid-apicalanteroseptal, anterior, and apical myocardium. Moderate   hypokinesis of the apicalinferior myocardium. Features are   consistent with a pseudonormal left ventricular filling pattern,   with concomitant abnormal relaxation and increased filling   pressure (grade 2 diastolic dysfunction). - Aortic valve: There was moderate stenosis. Mean gradient (S): 20   mm Hg. Valve area (VTI): 1.06  cm^2. Valve area (Vmax): 1.27 cm^2. - Mitral valve: Calcified annulus. There was mild regurgitation.   Valve area by continuity equation (using LVOT flow): 2.62 cm^2. - Left atrium: The atrium was moderately dilated. - Pulmonary arteries: Systolic pressure was moderately increased.   PA peak pressure: 50 mm Hg (S).  02/06/2018 Cardiac Cath Conclusion     There is mild to moderate left ventricular systolic dysfunction.  LV end diastolic pressure is severely elevated.  Ost RCA to Prox RCA lesion is 100% stenosed.  Mid LM to Dist LM lesion is 85% stenosed.  Ost 1st Mrg lesion is 95% stenosed.  Prox LAD to Mid LAD lesion is 40% stenosed.  Prox LAD lesion is 50% stenosed.  Ost 1st Diag to 1st Diag lesion is 60% stenosed.   1.  Severe left main and three-vessel coronary artery disease with chronically occluded right coronary artery with left-to-right collaterals.  The coronary arteries are overall heavily calcified and diffusely diseased. 2.  Mildly reduced LV systolic function with an EF of 40%.  Echocardiogram showed wall motion abnormalities in the anterior as well as inferior walls.  3.  Severely elevated filling pressures in the setting of severely elevated blood pressure. 4.  Moderate aortic stenosis with a peak to peak gradient of 20 mmHg.  Recommendations: Transfer to [facility] for CABG and aortic valve replacement.  The patient will need to be tuned up given flu.  He also appears to be volume overloaded and blood pressure is uncontrolled.  I added losartan and will diuresis with furosemide. Heparin drip to be resumed in 8 hours.    Patient Profile     72 y.o. male with a history of CAD, HTN, HL, DMII, Tob abuse, and COPD who was admitted to [facility] on 2/20 with dyspnea, pulm edema, and NSTEMI, and was subsequently found to have severe distal LM dzs  awaiting tx to [facility] for thoracic surgery eval.  Assessment & Plan    1.  NSTEMI/CAD: h/o CAD s/p prior inf infarct and LAD  stenting in 1998 (CTO RCA) w/ subsequent repeat PCI/rotablator w/in the LAD a few months later.  Recently dx with influenza A in setting of dyspnea, wheezing, malaise, and low grade fever. Developed worsening dyspnea on 2/20 prompting presentation to [facility]  pulm edema and NSTEMI (peak 2.65). Cath 2/21 showed severe dist LM and OM1 dzs with CTO of the RCA.  Also w/ mod Ao Stenosis.  Now transferred for CVTS consult. - he denies any CP or SOB today - continue on ASA 81mg  daily, high dose statin, IV Heparin gtt, long acting nitrates and BB.   - CVTS consult for AVR/CABG - will need to be tuned up first though due to recent influenza/COPD  2.  Essential HTN/Hypertensive urgency - BP remains elevated - continue amlodipine 5mg  daily and Lopressor 25mg  BID - Losartan increased to 100mg  daily today - continue to titrate BP meds as needed  3.  Moderate AS  - await CVTS input on probable AVR at time of CABG  4.  ICM/Acute systolic CHF - EF 40-45% and  LVEDP  on cath.  - he put out 900cc overnight and is net neg 865cc - weight down 2lbs - creatinine stable at 0.75  5.  Hyperlipidemia with LDL goal < 70. - continue high dose atorvastatin 80mg  daily and Zetia 10mg  daily - LDL 74 on 02/06/2018  6.  Tob Abuse:   - Cessation advised.  Says he quit earlier this week. - now on nicotine patch  7.  COPD exacerbation due to Influenza A   - Cont inhalers/nebs.   - on steroid taper - continue Tamiflu  8.  Bilateral Carotid Bruits:  ? 2/2 radiated murmur.   - dopplers showed moderate bilateral stenosis < 50% - continue ASA and statin  9.  NSVT - continue Lopressor  10.  Hypokalemia  - replete - check BMET in am  For questions or updates, please contact [facility] Please consult [email or web address] for contact info under Cardiology/STEMI.      Signed, Armanda Magic, MD  02/08/2018, 10:22 AM

## 2018-02-08 NOTE — Progress Notes (Signed)
Paged to see about Advance Directives.  Patient has already filled forms out and ready to have witnesses and notary Monday morning to finalize. Phebe Colla, Chaplain   02/08/18 1600  Clinical Encounter Type  Visited With Patient and family together  Visit Type Follow-up;Other (Comment) (Advance Directives ready to sign and notarize Monday morning)  Referral From Nurse  Consult/Referral To Chaplain

## 2018-02-08 NOTE — Progress Notes (Signed)
ANTICOAGULATION CONSULT NOTE - Follow Up Consult  Pharmacy Consult for heparin Indication: chest pain/ACS  Labs: Recent Labs    02/05/18 1555 02/05/18 2106 02/06/18 0244 02/06/18 0848 02/07/18 0419 02/07/18 2125 02/08/18 0319  HGB 15.4  --  14.5  --  15.6  --  15.5  HCT 45.5  --  42.9  --  44.6  --  44.7  PLT 205  --  204  --  205  --  235  APTT  --   --   --  49*  --   --   --   LABPROT  --   --   --  13.0  --   --   --   INR  --   --   --  0.99  --   --   --   HEPARINUNFRC  --   --   --   --  <0.10* 0.13* 0.40  CREATININE 0.77  --  0.92  --  0.91  --   --   TROPONINI 0.36* 1.74* 2.65*  --   --   --   --     Assessment/Plan:  71yo male therapeutic on heparin after rate change. Will continue gtt at current rate and confirm stable with additional level.   Vernard Gambles, PharmD, BCPS 02/08/2018,5:49 AM

## 2018-02-08 NOTE — Progress Notes (Signed)
Cardiac Monitoring Event  Dysrhythmia:  VTACH-9 beats   Symptoms:  None; pt asleep  Level of Consciousness: Asleep but easy to arouse  Last set of vital signs taken:  Temp: 99 F (37.2 C)  Pulse Rate: 66  Resp: (!) 22  BP: (!) 177/89  SpO2: 96 %   Comments/Actions Taken:  None. Pt asymptomatic. Will continue to monitor closely.

## 2018-02-09 ENCOUNTER — Encounter (HOSPITAL_COMMUNITY): Payer: Self-pay | Admitting: Thoracic Surgery (Cardiothoracic Vascular Surgery)

## 2018-02-09 DIAGNOSIS — I350 Nonrheumatic aortic (valve) stenosis: Secondary | ICD-10-CM

## 2018-02-09 DIAGNOSIS — Z720 Tobacco use: Secondary | ICD-10-CM

## 2018-02-09 DIAGNOSIS — J441 Chronic obstructive pulmonary disease with (acute) exacerbation: Secondary | ICD-10-CM | POA: Diagnosis present

## 2018-02-09 DIAGNOSIS — J449 Chronic obstructive pulmonary disease, unspecified: Secondary | ICD-10-CM

## 2018-02-09 LAB — COMPREHENSIVE METABOLIC PANEL
ALT: 43 U/L (ref 17–63)
AST: 25 U/L (ref 15–41)
Albumin: 2.9 g/dL — ABNORMAL LOW (ref 3.5–5.0)
Alkaline Phosphatase: 48 U/L (ref 38–126)
Anion gap: 11 (ref 5–15)
BUN: 26 mg/dL — ABNORMAL HIGH (ref 6–20)
CO2: 29 mmol/L (ref 22–32)
Calcium: 8.5 mg/dL — ABNORMAL LOW (ref 8.9–10.3)
Chloride: 97 mmol/L — ABNORMAL LOW (ref 101–111)
Creatinine, Ser: 0.9 mg/dL (ref 0.61–1.24)
GFR calc Af Amer: >60 mL/min (ref >60)
GFR calc non Af Amer: >60 mL/min (ref >60)
Glucose, Bld: 154 mg/dL — ABNORMAL HIGH (ref 65–99)
Potassium: 3.7 mmol/L (ref 3.5–5.1)
Sodium: 137 mmol/L (ref 135–145)
Total Bilirubin: 1 mg/dL (ref 0.3–1.2)
Total Protein: 5.8 g/dL — ABNORMAL LOW (ref 6.5–8.1)

## 2018-02-09 LAB — CBC
HCT: 45.4 % (ref 39.0–52.0)
Hemoglobin: 15.6 g/dL (ref 13.0–17.0)
MCH: 30.7 pg (ref 26.0–34.0)
MCHC: 34.4 g/dL (ref 30.0–36.0)
MCV: 89.4 fL (ref 78.0–100.0)
Platelets: 220 10*3/uL (ref 150–400)
RBC: 5.08 MIL/uL (ref 4.22–5.81)
RDW: 13.1 % (ref 11.5–15.5)
WBC: 10.2 10*3/uL (ref 4.0–10.5)

## 2018-02-09 LAB — GLUCOSE, CAPILLARY
Glucose-Capillary: 179 mg/dL — ABNORMAL HIGH (ref 65–99)
Glucose-Capillary: 208 mg/dL — ABNORMAL HIGH (ref 65–99)
Glucose-Capillary: 336 mg/dL — ABNORMAL HIGH (ref 65–99)
Glucose-Capillary: 171 mg/dL — ABNORMAL HIGH (ref 65–99)

## 2018-02-09 LAB — HEPARIN LEVEL (UNFRACTIONATED): Heparin Unfractionated: 0.42 IU/mL (ref 0.30–0.70)

## 2018-02-09 MED ORDER — GUAIFENESIN-DM 100-10 MG/5ML PO SYRP
5.0000 mL | ORAL_SOLUTION | ORAL | Status: DC | PRN
  Administered 2018-02-09 – 2018-02-17 (×10): 5 mL via ORAL
  Filled 2018-02-09 (×10): qty 5

## 2018-02-09 MED ORDER — AZITHROMYCIN 250 MG PO TABS
250.0000 mg | ORAL_TABLET | Freq: Every day | ORAL | Status: AC
  Administered 2018-02-10 – 2018-02-13 (×4): 250 mg via ORAL
  Filled 2018-02-09 (×4): qty 1

## 2018-02-09 MED ORDER — AZITHROMYCIN 250 MG PO TABS
500.0000 mg | ORAL_TABLET | Freq: Every day | ORAL | Status: AC
  Administered 2018-02-09: 500 mg via ORAL
  Filled 2018-02-09: qty 2

## 2018-02-09 NOTE — Progress Notes (Signed)
ANTICOAGULATION CONSULT NOTE - Follow Up Consult  Pharmacy Consult for Heparin Indication: chest pain/ACS  No Known Allergies  Patient Measurements: Height: 5\' 6"  (167.6 cm) Weight: 165 lb 11.2 oz (75.2 kg) IBW/kg (Calculated) : 63.8 Heparin Dosing Weight: 76.1 kg  Vital Signs: Temp: 98.8 F (37.1 C) (02/23 2104) Temp Source: Oral (02/23 2104) BP: 137/72 (02/23 2104) Pulse Rate: 106 (02/23 2104)  Labs: Recent Labs    02/06/18 0244 02/06/18 0848 02/07/18 0419  02/08/18 0319 02/08/18 1112 02/08/18 2202  HGB 14.5  --  15.6  --  15.5  --   --   HCT 42.9  --  44.6  --  44.7  --   --   PLT 204  --  205  --  235  --   --   APTT  --  49*  --   --   --   --   --   LABPROT  --  13.0  --   --   --   --   --   INR  --  0.99  --   --   --   --   --   HEPARINUNFRC  --   --  <0.10*   < > 0.40 0.25* 0.56  CREATININE 0.92  --  0.91  --  0.75  --   --   TROPONINI 2.65*  --   --   --   --   --   --    < > = values in this interval not displayed.    Estimated Creatinine Clearance: 74.2 mL/min (by C-G formula based on SCr of 0.75 mg/dL).  Assessment: 29 yoM consulted for heparin for ACS. Transfer from [facility] where he presented with pulmonary edema and NSTEMI. Awaiting decision on CABG and AVR.  Heparin level is therapeutic. CBC WNL.  Goal of Therapy:  Heparin level 0.3-0.7 units/ml Monitor platelets by anticoagulation protocol: Yes   Plan:  Continue heparin infusion at 1500 units/hr Daily heparin level, CBC   Baldemar Friday 02/09/2018 1:26 AM

## 2018-02-09 NOTE — Progress Notes (Signed)
Progress Note  Patient Name: Harold Parker Date of Encounter: 02/09/2018  Primary Cardiologist: No primary care provider on file.   Subjective   Denies any chest pain or SOB  Inpatient Medications    Scheduled Meds: . amLODipine  5 mg Oral Daily  . aspirin EC  81 mg Oral Daily  . atorvastatin  80 mg Oral q1800  . [START ON 02/10/2018] azithromycin  250 mg Oral Daily  . Chlorhexidine Gluconate Cloth  6 each Topical Daily  . ezetimibe  10 mg Oral Daily  . furosemide  20 mg Intravenous BID  . insulin aspart  0-15 Units Subcutaneous TID WC  . ipratropium-albuterol  3 mL Nebulization TID  . isosorbide mononitrate  60 mg Oral Daily  . losartan  100 mg Oral Daily  . metoprolol tartrate  25 mg Oral BID  . mupirocin ointment  1 application Nasal BID  . nicotine  14 mg Transdermal Daily  . oseltamivir  75 mg Oral BID  . potassium chloride  20 mEq Oral BID  . [START ON 02/11/2018] predniSONE  10 mg Oral Once  . [START ON 02/10/2018] predniSONE  20 mg Oral Once  . sodium chloride flush  3 mL Intravenous Q12H   Continuous Infusions: . sodium chloride    . heparin 1,500 Units/hr (02/09/18 0606)   PRN Meds: sodium chloride, acetaminophen, ALPRAZolam, ipratropium-albuterol, nitroGLYCERIN, ondansetron (ZOFRAN) IV, sodium chloride flush   Vital Signs    Vitals:   02/08/18 2104 02/09/18 0405 02/09/18 0604 02/09/18 0731  BP: 137/72  (!) 151/84   Pulse: (!) 106  97   Resp:   19   Temp: 98.8 F (37.1 C)  98.5 F (36.9 C)   TempSrc: Oral  Oral   SpO2: 96% 95% 95% 94%  Weight:   164 lb 1.6 oz (74.4 kg)   Height:        Intake/Output Summary (Last 24 hours) at 02/09/2018 0843 Last data filed at 02/09/2018 0606 Gross per 24 hour  Intake 880.5 ml  Output 1275 ml  Net -394.5 ml   Filed Weights   02/07/18 1832 02/08/18 0446 02/09/18 0604  Weight: 167 lb 11.2 oz (76.1 kg) 165 lb 11.2 oz (75.2 kg) 164 lb 1.6 oz (74.4 kg)    Telemetry    NSR - Personally Reviewed  ECG    No  new EKG to review - Personally Reviewed  Physical Exam   GEN: No acute distress.   Neck: No JVD Cardiac: RRR, no murmurs, rubs, or gallops.  Respiratory: Clear to auscultation bilaterally. GI: Soft, nontender, non-distended  MS: No edema; No deformity. Neuro:  Nonfocal  Psych: Normal affect   Labs    Chemistry Recent Labs  Lab 02/03/18 0950  02/07/18 0419 02/08/18 0319 02/09/18 0327  NA 137   < > 139 137 137  K 4.3   < > 4.2 3.2* 3.7  CL 105   < > 102 96* 97*  CO2 22   < > 29 29 29   GLUCOSE 167*   < > 217* 150* 154*  BUN 22*   < > 30* 24* 26*  CREATININE 0.84   < > 0.91 0.75 0.90  CALCIUM 8.7*   < > 8.5* 8.6* 8.5*  PROT 7.1  --   --   --  5.8*  ALBUMIN 3.9  --   --   --  2.9*  AST 54*  --   --   --  25  ALT 25  --   --   --  43  ALKPHOS 45  --   --   --  48  BILITOT 0.9  --   --   --  1.0  GFRNONAA >60   < > >60 >60 >60  GFRAA >60   < > >60 >60 >60  ANIONGAP 10   < > 8 12 11    < > = values in this interval not displayed.     Hematology Recent Labs  Lab 02/07/18 0419 02/08/18 0319 02/09/18 0327  WBC 9.0 12.0* 10.2  RBC 5.03 5.01 5.08  HGB 15.6 15.5 15.6  HCT 44.6 44.7 45.4  MCV 88.8 89.2 89.4  MCH 31.0 30.9 30.7  MCHC 34.9 34.7 34.4  RDW 13.5 13.3 13.1  PLT 205 235 220    Cardiac Enzymes Recent Labs  Lab 02/05/18 1555 02/05/18 2106 02/06/18 0244  TROPONINI 0.36* 1.74* 2.65*   No results for input(s): TROPIPOC in the last 168 hours.   BNPNo results for input(s): BNP, PROBNP in the last 168 hours.   DDimer No results for input(s): DDIMER in the last 168 hours.   Radiology    US Carotid Bilateral  Result Date: 02/07/2018 CLINICAL DATA:  Asymptomatic bilateral carotid bruits EXAM: BILATERAL CAROTID DUPLEX ULTRASOUND TECHNIQUE: Wallace Cullens scale imaging, color Doppler and duplex ultrasound were performed of bilateral carotid and vertebral arteries in the neck. COMPARISON:  None. FINDINGS: Criteria: Quantification of carotid stenosis is based on velocity  parameters that correlate the residual internal carotid diameter with NASCET-based stenosis levels, using the diameter of the distal internal carotid lumen as the denominator for stenosis measurement. The following velocity measurements were obtained: RIGHT ICA:  100/17 cm/sec CCA:  79/12 cm/sec SYSTOLIC ICA/CCA RATIO:  1.3 DIASTOLIC ICA/CCA RATIO:  1.5 ECA:  190 cm/sec LEFT ICA:  121/22 cm/sec CCA:  99/15 cm/sec SYSTOLIC ICA/CCA RATIO:  1.2 DIASTOLIC ICA/CCA RATIO:  1.5 ECA:  190 cm/sec RIGHT CAROTID ARTERY: Moderate diffuse mixed echogenicity carotid atherosclerosis. Despite this, no hemodynamically significant right ICA stenosis, velocity elevation, or turbulent flow. Degree of narrowing less than 50% by ultrasound criteria. RIGHT VERTEBRAL ARTERY:  Antegrade LEFT CAROTID ARTERY: Similar moderate diffuse mixed echogenicity atherosclerosis. Despite this, no hemodynamically significant left ICA stenosis, velocity elevation, or turbulent flow. LEFT VERTEBRAL ARTERY:  Antegrade IMPRESSION: Moderate bilateral carotid atherosclerosis. No hemodynamically significant ICA stenosis by ultrasound. Degree of narrowing less than 50% bilaterally by ultrasound criteria. Patent antegrade vertebral flow bilaterally Electronically Signed   By: Judie Petit.  Shick M.D.   On: 02/07/2018 10:47    Cardiac Studies   2D echo 01/2018 Study Conclusions  - Left ventricle: The cavity size was normal. There was mild concentric hypertrophy. Systolic function was mildly to moderately reduced. The estimated ejection fraction was in the range of 40% to 45%. Severe hypokinesis of the mid-apicalanteroseptal, anterior, and apical myocardium. Moderate hypokinesis of the apicalinferior myocardium. Features are consistent with a pseudonormal left ventricular filling pattern, with concomitant abnormal relaxation and increased filling pressure (grade 2 diastolic dysfunction). - Aortic valve: There was moderate stenosis. Mean  gradient (S): 20 mm Hg. Valve area (VTI): 1.06 cm^2. Valve area (Vmax): 1.27 cm^2. - Mitral valve: Calcified annulus. There was mild regurgitation. Valve area by continuity equation (using LVOT flow): 2.62 cm^2. - Left atrium: The atrium was moderately dilated. - Pulmonary arteries: Systolic pressure was moderately increased. PA peak pressure: 50 mm Hg (S).  02/06/2018 Cardiac Cath Conclusion     There is mild to moderate left ventricular systolic dysfunction.  LV end diastolic pressure is  severely elevated.  Ost RCA to Prox RCA lesion is 100% stenosed.  Mid LM to Dist LM lesion is 85% stenosed.  Ost 1st Mrg lesion is 95% stenosed.  Prox LAD to Mid LAD lesion is 40% stenosed.  Prox LAD lesion is 50% stenosed.  Ost 1st Diag to 1st Diag lesion is 60% stenosed.  1. Severe left main and three-vessel coronary artery disease with chronically occluded right coronary artery with left-to-right collaterals. The coronary arteries are overall heavily calcified and diffusely diseased. 2. Mildly reduced LV systolic function with an EF of 40%. Echocardiogram showed wall motion abnormalities in the anterior as well as inferior walls.  3. Severely elevated filling pressures in the setting of severely elevated blood pressure. 4. Moderate aortic stenosis with a peak to peak gradient of 20 mmHg.  Recommendations: Transfer to [facility] for CABG and aortic valve replacement. The patient will need to be tuned up given flu. He also appears to be volume overloaded and blood pressure is uncontrolled. I added losartan and will diuresis with furosemide. Heparin drip to be resumed in 8 hours.     Patient Profile     72 y.o. male a history of CAD, HTN, HL, DMII, Tob abuse, and COPDwho was [facility] with dyspnea, pulm edema, and NSTEMI, and was subsequently found to have severe distal LM dzsawaiting tx to [facility].  Assessment & Plan    1.  NSTEMI/CAD: h/o CAD s/p prior inf infarct and LAD stenting in 1998 (CTO RCA) w/ subsequent repeat PCI/rotablator w/in the LAD a few months later. Recently dx with influenza A in setting of dyspnea, wheezing, malaise, and low grade fever. Developed worsening dyspnea on 2/20 prompting presentation to [facility] pulm edema and NSTEMI (peak 2.65). Cath 2/21 showed severe dist LM and OM1 dzs with CTO of the RCA. Also w/ mod Ao Stenosis. Now transferred for CVTS consult. - no further CP overnight - he will continue on ASA, high dose statin, IV Heparin gtt, long acting nitrates and BB.   - Appreciate CVTS consult - they plan for CABG/AVR once patient is tuned up from a respiratory standpoint due to recent COPD exacerbation and Influenza  2. Essential HTN/Hypertensive urgency - BP improved after increasing Losartan to 100mg  daily - continue amlodipine 5mg  daily and Lopressor 25mg  BID  3. Moderate AS  - CVTS plans for AVR at time of CABG  4. ICM/Acute systolic CHF -EF 34-74% and  LVEDP  on cath.  - he put out 1.47L overnight and is net neg 1.2L - weight down 3lbs - creatinine stable at 0.9  5. Hyperlipidemia with LDL goal < 70. - continue high dose atorvastatin 80mg  daily and Zetia 10mg  daily - LDL 74 on 02/06/2018  6. Tob Abuse:  - Cessation advised. Says he quit earlier this week. - now on nicotine patch  7. COPD exacerbation due to Influenza A  - Cont inhalers/nebs.  - on steroid taper - continue Tamiflu  8. Bilateral Carotid Bruits: ? 2/2 radiated murmur.  - dopplers showed moderate bilateral stenosis < 50% - continue ASA and statin  9. NSVT - no further episodes on tele - continue Lopressor  10.  Hypokalemia  - repleted   For questions or updates, please contact [facility] Please consult [email or web address] for contact info under Cardiology/STEMI.      Signed, Armanda Magic, MD  02/09/2018, 8:43 AM

## 2018-02-10 ENCOUNTER — Inpatient Hospital Stay (HOSPITAL_COMMUNITY): Payer: Medicare Other

## 2018-02-10 DIAGNOSIS — E1159 Type 2 diabetes mellitus with other circulatory complications: Secondary | ICD-10-CM

## 2018-02-10 DIAGNOSIS — I350 Nonrheumatic aortic (valve) stenosis: Secondary | ICD-10-CM

## 2018-02-10 DIAGNOSIS — J111 Influenza due to unidentified influenza virus with other respiratory manifestations: Secondary | ICD-10-CM

## 2018-02-10 LAB — BLOOD GAS, ARTERIAL
Acid-Base Excess: 7.6 mmol/L — ABNORMAL HIGH (ref 0.0–2.0)
Acid-Base Excess: 2.9 mmol/L — ABNORMAL HIGH (ref 0.0–2.0)
Bicarbonate: 31.6 mmol/L — ABNORMAL HIGH (ref 20.0–28.0)
Bicarbonate: 26.5 mmol/L (ref 20.0–28.0)
Drawn by: 404151
Drawn by: 398991
FIO2: 21
FIO2: 21
O2 Saturation: 85.5 %
O2 Saturation: 89.1 %
Patient temperature: 98.6
Patient temperature: 97.9
pCO2 arterial: 44.8 mmHg (ref 32.0–48.0)
pCO2 arterial: 37.2 mmHg (ref 32.0–48.0)
pH, Arterial: 7.462 — ABNORMAL HIGH (ref 7.350–7.450)
pH, Arterial: 7.464 — ABNORMAL HIGH (ref 7.350–7.450)
pO2, Arterial: 49.4 mmHg — ABNORMAL LOW (ref 83.0–108.0)
pO2, Arterial: 51.4 mmHg — ABNORMAL LOW (ref 83.0–108.0)

## 2018-02-10 LAB — CBC
HCT: 44.4 % (ref 39.0–52.0)
Hemoglobin: 15 g/dL (ref 13.0–17.0)
MCH: 30.5 pg (ref 26.0–34.0)
MCHC: 33.8 g/dL (ref 30.0–36.0)
MCV: 90.4 fL (ref 78.0–100.0)
Platelets: 223 10*3/uL (ref 150–400)
RBC: 4.91 MIL/uL (ref 4.22–5.81)
RDW: 13.2 % (ref 11.5–15.5)
WBC: 12.4 10*3/uL — ABNORMAL HIGH (ref 4.0–10.5)

## 2018-02-10 LAB — HEPARIN LEVEL (UNFRACTIONATED): Heparin Unfractionated: 0.36 IU/mL (ref 0.30–0.70)

## 2018-02-10 LAB — GLUCOSE, CAPILLARY
Glucose-Capillary: 188 mg/dL — ABNORMAL HIGH (ref 65–99)
Glucose-Capillary: 234 mg/dL — ABNORMAL HIGH (ref 65–99)
Glucose-Capillary: 258 mg/dL — ABNORMAL HIGH (ref 65–99)
Glucose-Capillary: 185 mg/dL — ABNORMAL HIGH (ref 65–99)

## 2018-02-10 NOTE — Progress Notes (Signed)
[id number] Gave pt [facility] booklet and care guide and in the tube handout. Discussed sternal precautions and keeping arms close to side. Discussed pre op video and wrote how to view. Pt stated he has significant other that will be able to care for him after discharge. Discussed importance of IS and mobility after surgery. Needs IS.  Daughter in room for ed. Will follow up after surgery. Luetta Nutting RN BSN 02/10/2018 2:38 PM

## 2018-02-10 NOTE — Progress Notes (Signed)
      [address]       [name] [address]             [phone]        CARDIOTHORACIC SURGERY PROGRESS NOTE  Subjective: dyspneic with activity.  Feels warm but no recorded fevers  Objective: Vital signs: BP Readings from Last 1 Encounters:  02/10/18 (!) 118/55   Pulse Readings from Last 1 Encounters:  02/10/18 97   Resp Readings from Last 1 Encounters:  02/09/18 (!) 22   Temp Readings from Last 1 Encounters:  02/10/18 98.9 F (37.2 C) (Oral)    Physical Exam:   Breath sounds: Distant and coarse, no wheezing  Heart sounds:  RRR w/ systolic murmur  Incisions:  n/a  Abdomen:  soft  Extremities:  warm   Intake/Output from previous day: 02/24 0701 - 02/25 0700 In: 240 [P.O.:240] Out: 1400 [Urine:1400] Intake/Output this shift: Total I/O In: 600 [P.O.:600] Out: 300 [Urine:300]  Lab Results:  CBC: Recent Labs    02/09/18 0327 02/10/18 0400  WBC 10.2 12.4*  HGB 15.6 15.0  HCT 45.4 44.4  PLT 220 223    BMET:  Recent Labs    02/08/18 0319 02/09/18 0327  NA 137 137  K 3.2* 3.7  CL 96* 97*  CO2 29 29  GLUCOSE 150* 154*  BUN 24* 26*  CREATININE 0.75 0.90  CALCIUM 8.6* 8.5*     PT/INR:  No results for input(s): LABPROT, INR in the last 72 hours.  CBG (last 3)  Recent Labs    02/10/18 0728 02/10/18 1116 02/10/18 1642  GLUCAP 188* 234* 258*    ABG    Component Value Date/Time   PHART 7.464 (H) 02/10/2018 0730   PCO2ART 37.2 02/10/2018 0730   PO2ART 51.4 (L) 02/10/2018 0730   HCO3 26.5 02/10/2018 0730   O2SAT 89.1 02/10/2018 0730    CXR: n/a  Assessment/Plan:  ABG results look terrible PFT's still not done CTA still not done Flu remains an issue and I suspect he has severe underlying COPD Mr Burks clearly will not be ready for surgery by Wednesday and little has been done since his admission to "tune him up" Will ask Pulmonary Medicine to see him in consult Will follow and reconsider timing of surgery  depending on input from Pulmonary Medicine team and how he does clinically over the next 2-3 days   I spent in excess of 15 minutes during the conduct of this hospital encounter and >50% of this time involved direct face-to-face encounter with the patient for counseling and/or coordination of their care.  Purcell Nails, MD 02/10/2018 6:32 PM

## 2018-02-10 NOTE — Progress Notes (Signed)
ANTICOAGULATION CONSULT NOTE - Follow Up Consult  Pharmacy Consult for Heparin Indication: chest pain/ACS  No Known Allergies  Patient Measurements: Height: 5\' 6"  (167.6 cm) Weight: 130 lb 6.4 oz (59.1 kg) IBW/kg (Calculated) : 63.8 Heparin Dosing Weight: 76.1 kg  Vital Signs: Temp: 98.9 F (37.2 C) (02/25 1256) Temp Source: Oral (02/25 1256) BP: 118/55 (02/25 1256) Pulse Rate: 97 (02/25 0600)  Labs: Recent Labs    02/08/18 0319  02/08/18 2202 02/09/18 0327 02/10/18 0400  HGB 15.5  --   --  15.6 15.0  HCT 44.7  --   --  45.4 44.4  PLT 235  --   --  220 223  HEPARINUNFRC 0.40   < > 0.56 0.42 0.36  CREATININE 0.75  --   --  0.90  --    < > = values in this interval not displayed.    Estimated Creatinine Clearance: 61.1 mL/min (by C-G formula based on SCr of 0.9 mg/dL).  Assessment: 59 yoM consulted for heparin for ACS. Transfer from [facility] where he presented with pulmonary edema and NSTEMI. Appears plans are for CABG/AVR on Wed, 2/27  Heparin level this morning remains therapeutic (HL 0.36, goal of 0.3-0.7). CBC wnl and stable - no bleeding noted.   Goal of Therapy:  Heparin level 0.3-0.7 units/ml Monitor platelets by anticoagulation protocol: Yes   Plan:  1. Continue Heparin at 1500 units/hr (15 ml/hr) 2. Will continue to monitor for any signs/symptoms of bleeding and will follow up with heparin level in the a.m.   Thank you for allowing pharmacy to be a part of this patient's care.  Georgina Pillion, PharmD, BCPS Clinical Pharmacist Pager: [phone] 02/10/2018 3:22 PM

## 2018-02-10 NOTE — Plan of Care (Signed)
  Education: Knowledge of General Education information will improve 02/10/2018 2332 - Progressing by Viviano Simas, RN   Activity: Ability to tolerate increased activity will improve 02/10/2018 2332 - Progressing by Viviano Simas, RN   Cardiac: Ability to achieve and maintain adequate cardiopulmonary perfusion will improve 02/10/2018 2332 - Progressing by Viviano Simas, RN Vascular access site(s) Level 0-1 will be maintained 02/10/2018 2332 - Progressing by Viviano Simas, RN  Pt awaiting CABG and AVR surgery. Continuing current plan of care as ordered.

## 2018-02-10 NOTE — Progress Notes (Addendum)
Progress Note  Patient Name: Harold Parker Date of Encounter: 02/10/2018  Primary Cardiologist: No primary care provider on file.   Subjective   No chest pain.  Remains on droplet precautions.  Inpatient Medications    Scheduled Meds: . amLODipine  5 mg Oral Daily  . aspirin EC  81 mg Oral Daily  . atorvastatin  80 mg Oral q1800  . azithromycin  250 mg Oral Daily  . Chlorhexidine Gluconate Cloth  6 each Topical Daily  . ezetimibe  10 mg Oral Daily  . furosemide  20 mg Intravenous BID  . insulin aspart  0-15 Units Subcutaneous TID WC  . ipratropium-albuterol  3 mL Nebulization TID  . isosorbide mononitrate  60 mg Oral Daily  . losartan  100 mg Oral Daily  . metoprolol tartrate  25 mg Oral BID  . mupirocin ointment  1 application Nasal BID  . nicotine  14 mg Transdermal Daily  . oseltamivir  75 mg Oral BID  . potassium chloride  20 mEq Oral BID  . [START ON 02/11/2018] predniSONE  10 mg Oral Once  . sodium chloride flush  3 mL Intravenous Q12H   Continuous Infusions: . sodium chloride    . heparin 1,500 Units/hr (02/09/18 2346)   PRN Meds: sodium chloride, acetaminophen, ALPRAZolam, guaiFENesin-dextromethorphan, ipratropium-albuterol, nitroGLYCERIN, ondansetron (ZOFRAN) IV, sodium chloride flush   Vital Signs    Vitals:   02/09/18 2123 02/10/18 0500 02/10/18 0600 02/10/18 0718  BP: 134/64  (!) 140/58   Pulse: 99  97   Resp:      Temp:   97.9 F (36.6 C)   TempSrc:   Oral   SpO2:   96% 94%  Weight:  130 lb 6.4 oz (59.1 kg)    Height:        Intake/Output Summary (Last 24 hours) at 02/10/2018 1118 Last data filed at 02/10/2018 0815 Gross per 24 hour  Intake 120 ml  Output 1050 ml  Net -930 ml   Filed Weights   02/08/18 0446 02/09/18 0604 02/10/18 0500  Weight: 165 lb 11.2 oz (75.2 kg) 164 lb 1.6 oz (74.4 kg) 130 lb 6.4 oz (59.1 kg)    Telemetry    NSR - Personally Reviewed  ECG    2/22: NSR, NSST- Personally Reviewed  Physical Exam   GEN: No  acute distress.   Neck: No JVD Cardiac: RRR, no murmurs, rubs, or gallops.  Respiratory: Coarse breath sounds to auscultation bilaterally. GI: Soft, nontender, non-distended  MS: No edema; No deformity. Neuro:  Nonfocal  Psych: Normal affect   Labs    Chemistry Recent Labs  Lab 02/07/18 0419 02/08/18 0319 02/09/18 0327  NA 139 137 137  K 4.2 3.2* 3.7  CL 102 96* 97*  CO2 29 29 29   GLUCOSE 217* 150* 154*  BUN 30* 24* 26*  CREATININE 0.91 0.75 0.90  CALCIUM 8.5* 8.6* 8.5*  PROT  --   --  5.8*  ALBUMIN  --   --  2.9*  AST  --   --  25  ALT  --   --  43  ALKPHOS  --   --  48  BILITOT  --   --  1.0  GFRNONAA >60 >60 >60  GFRAA >60 >60 >60  ANIONGAP 8 12 11      Hematology Recent Labs  Lab 02/08/18 0319 02/09/18 0327 02/10/18 0400  WBC 12.0* 10.2 12.4*  RBC 5.01 5.08 4.91  HGB 15.5 15.6 15.0  HCT 44.7 45.4  44.4  MCV 89.2 89.4 90.4  MCH 30.9 30.7 30.5  MCHC 34.7 34.4 33.8  RDW 13.3 13.1 13.2  PLT 235 220 223    Cardiac Enzymes Recent Labs  Lab 02/05/18 1555 02/05/18 2106 02/06/18 0244  TROPONINI 0.36* 1.74* 2.65*   No results for input(s): TROPIPOC in the last 168 hours.   BNPNo results for input(s): BNP, PROBNP in the last 168 hours.   DDimer No results for input(s): DDIMER in the last 168 hours.   Radiology    No results found.  Cardiac Studies   Cath shows multivessel CAD.  Patient Profile     72 y.o. male with planned CABG/AVR  Assessment & Plan    1) CAD: no angina at this time. IN process of w/u prior to cardiac surgery, planned Wednesday.  2) Flu: Currently on treatment for the flu.  3) DM: on sliding scale.   HTN: COntinue meds. Needs to stop smoking.    For questions or updates, please contact [facility] Please consult [email or web address] for contact info under Cardiology/STEMI.      Signed, Lance Muss, MD  02/10/2018, 11:18 AM

## 2018-02-11 ENCOUNTER — Inpatient Hospital Stay (HOSPITAL_COMMUNITY): Payer: Medicare Other

## 2018-02-11 ENCOUNTER — Encounter (HOSPITAL_COMMUNITY): Payer: Medicare Other

## 2018-02-11 DIAGNOSIS — J9621 Acute and chronic respiratory failure with hypoxia: Secondary | ICD-10-CM

## 2018-02-11 DIAGNOSIS — J810 Acute pulmonary edema: Secondary | ICD-10-CM

## 2018-02-11 DIAGNOSIS — J111 Influenza due to unidentified influenza virus with other respiratory manifestations: Secondary | ICD-10-CM

## 2018-02-11 DIAGNOSIS — J9601 Acute respiratory failure with hypoxia: Secondary | ICD-10-CM

## 2018-02-11 DIAGNOSIS — J449 Chronic obstructive pulmonary disease, unspecified: Secondary | ICD-10-CM

## 2018-02-11 LAB — CBC
HCT: 41.6 % (ref 39.0–52.0)
Hemoglobin: 14.2 g/dL (ref 13.0–17.0)
MCH: 30.8 pg (ref 26.0–34.0)
MCHC: 34.1 g/dL (ref 30.0–36.0)
MCV: 90.2 fL (ref 78.0–100.0)
Platelets: 238 10*3/uL (ref 150–400)
RBC: 4.61 MIL/uL (ref 4.22–5.81)
RDW: 13.1 % (ref 11.5–15.5)
WBC: 14.7 10*3/uL — ABNORMAL HIGH (ref 4.0–10.5)

## 2018-02-11 LAB — GLUCOSE, CAPILLARY
Glucose-Capillary: 172 mg/dL — ABNORMAL HIGH (ref 65–99)
Glucose-Capillary: 263 mg/dL — ABNORMAL HIGH (ref 65–99)
Glucose-Capillary: 212 mg/dL — ABNORMAL HIGH (ref 65–99)
Glucose-Capillary: 216 mg/dL — ABNORMAL HIGH (ref 65–99)

## 2018-02-11 LAB — HEPARIN LEVEL (UNFRACTIONATED): Heparin Unfractionated: 0.33 IU/mL (ref 0.30–0.70)

## 2018-02-11 MED ORDER — METOPROLOL TARTRATE 5 MG/5ML IV SOLN
5.0000 mg | INTRAVENOUS | Status: DC | PRN
  Administered 2018-02-11 (×2): 5 mg via INTRAVENOUS

## 2018-02-11 MED ORDER — IOPAMIDOL (ISOVUE-370) INJECTION 76%
INTRAVENOUS | Status: AC
  Administered 2018-02-11: 80 mL via INTRAVENOUS
  Filled 2018-02-11: qty 100

## 2018-02-11 MED ORDER — METOPROLOL TARTRATE 5 MG/5ML IV SOLN
INTRAVENOUS | Status: AC
  Filled 2018-02-11: qty 10

## 2018-02-11 NOTE — Progress Notes (Signed)
PFT unable to be done today due to patient on droplet precautions, as this is against our policy.  According to notes, tamiflu will be finished dosing 02/12/18; will attempt to do PFT then.  Spoke with Alycia Rossetti at [facility], she explained that the surgery is tentatively planned for Friday of this week.  PFTs performed either Wednesday or Thursday will be appropriate.

## 2018-02-11 NOTE — Progress Notes (Addendum)
Progress Note  Patient Name: Harold Parker Date of Encounter: 02/11/2018  Primary Cardiologist: No primary care provider on file.   Subjective   Feels better. No chest pain.  Inpatient Medications    Scheduled Meds: . amLODipine  5 mg Oral Daily  . aspirin EC  81 mg Oral Daily  . atorvastatin  80 mg Oral q1800  . azithromycin  250 mg Oral Daily  . Chlorhexidine Gluconate Cloth  6 each Topical Daily  . ezetimibe  10 mg Oral Daily  . furosemide  20 mg Intravenous BID  . insulin aspart  0-15 Units Subcutaneous TID WC  . ipratropium-albuterol  3 mL Nebulization TID  . isosorbide mononitrate  60 mg Oral Daily  . losartan  100 mg Oral Daily  . metoprolol tartrate      . metoprolol tartrate  25 mg Oral BID  . mupirocin ointment  1 application Nasal BID  . nicotine  14 mg Transdermal Daily  . potassium chloride  20 mEq Oral BID  . sodium chloride flush  3 mL Intravenous Q12H   Continuous Infusions: . sodium chloride    . heparin 1,600 Units/hr (02/11/18 1148)   PRN Meds: sodium chloride, acetaminophen, ALPRAZolam, guaiFENesin-dextromethorphan, ipratropium-albuterol, nitroGLYCERIN, ondansetron (ZOFRAN) IV, sodium chloride flush   Vital Signs    Vitals:   02/10/18 2059 02/11/18 0455 02/11/18 0801 02/11/18 0829  BP: 120/60 (!) 160/64  138/68  Pulse: 98 87    Resp: 18 (!) 21    Temp: 98.7 F (37.1 C) 98.5 F (36.9 C)    TempSrc: Oral Oral    SpO2: 94% 96% 95%   Weight:  164 lb 11.2 oz (74.7 kg)    Height:        Intake/Output Summary (Last 24 hours) at 02/11/2018 1234 Last data filed at 02/11/2018 1048 Gross per 24 hour  Intake 1200 ml  Output 1225 ml  Net -25 ml   Filed Weights   02/09/18 0604 02/10/18 0500 02/11/18 0455  Weight: 164 lb 1.6 oz (74.4 kg) 130 lb 6.4 oz (59.1 kg) 164 lb 11.2 oz (74.7 kg)    Telemetry    tele - Personally Reviewed  ECG    Reviewed  Physical Exam   GEN: No acute distress.   Neck: No JVD Cardiac: RRR, no murmurs, rubs,  or gallops.  Respiratory: wheezing to auscultation bilaterally. GI: Soft, nontender, non-distended  MS: No edema; No deformity. Neuro:  Nonfocal  Psych: Normal affect   Labs    Chemistry Recent Labs  Lab 02/07/18 0419 02/08/18 0319 02/09/18 0327  NA 139 137 137  K 4.2 3.2* 3.7  CL 102 96* 97*  CO2 29 29 29   GLUCOSE 217* 150* 154*  BUN 30* 24* 26*  CREATININE 0.91 0.75 0.90  CALCIUM 8.5* 8.6* 8.5*  PROT  --   --  5.8*  ALBUMIN  --   --  2.9*  AST  --   --  25  ALT  --   --  43  ALKPHOS  --   --  48  BILITOT  --   --  1.0  GFRNONAA >60 >60 >60  GFRAA >60 >60 >60  ANIONGAP 8 12 11      Hematology Recent Labs  Lab 02/09/18 0327 02/10/18 0400 02/11/18 0312  WBC 10.2 12.4* 14.7*  RBC 5.08 4.91 4.61  HGB 15.6 15.0 14.2  HCT 45.4 44.4 41.6  MCV 89.4 90.4 90.2  MCH 30.7 30.5 30.8  MCHC 34.4 33.8 34.1  RDW 13.1 13.2 13.1  PLT 220 223 238    Cardiac Enzymes Recent Labs  Lab 02/05/18 1555 02/05/18 2106 02/06/18 0244  TROPONINI 0.36* 1.74* 2.65*   No results for input(s): TROPIPOC in the last 168 hours.   BNPNo results for input(s): BNP, PROBNP in the last 168 hours.   DDimer No results for input(s): DDIMER in the last 168 hours.   Radiology    Ct Cardiac Morph/pulm Vein W/cm&w/o Ca Score  Result Date: 02/11/2018 EXAM: OVER-READ INTERPRETATION  CT CHEST The following report is an over-read performed by radiologist Dr. Genevive Bi of [facility] on 02/11/2018. This over-read does not include interpretation of cardiac or coronary anatomy or pathology. The coronary calcium score/coronary CTA interpretation by the cardiologist is attached. COMPARISON:  None. FINDINGS: Limited view of the lung parenchyma demonstrates fine branching nodular pattern in the LEFT and RIGHT lobe. Similar findings to a lesser degree RIGHT middle lobe. These findings suggest pulmonary infection. There is mild peribronchial thickening along the LEFT and RIGHT hilar bronchi.  Airways are normal. Limited view of the mediastinum peribronchial thickening along the LEFT and RIGHT bronchus. No mediastinal adenopathy. Esophagus. Limited view of the upper abdomen unremarkable. Limited view of the skeleton and chest wall is unremarkable. IMPRESSION: 1. Bilateral branching fine nodular pattern most consistent pulmonary infection. 2. Peribronchial thickening along the central airways also consistent with infection or inflammation. These results will be called to the ordering clinician or representative by the Radiologist Assistant, and communication documented in the PACS or zVision Dashboard. Electronically Signed   By: Genevive Bi M.D.   On: 02/11/2018 10:03    Cardiac Studies     Patient Profile     72 y.o. male who is scheduled for CABG/AVR on Friday.  Assessment & Plan    1) CAD/NSTEMI: No angina.  Surgery postponed until Friday for pulmonary tune-up.   2) Aortic stenosis: Valve replacement planned.   3) COntinue tamiflu.  Pulmonary consult planned to optimize for surgery.  For questions or updates, please contact [facility] Please consult [email or web address] for contact info under Cardiology/STEMI.      Signed, Lance Muss, MD  02/11/2018, 12:34 PM

## 2018-02-11 NOTE — Progress Notes (Signed)
RN contacted respiratory about the PFTs as they have not been completed yet, they said that the patient will be on the schedule to be completed tomorrow February 27.

## 2018-02-11 NOTE — Progress Notes (Signed)
   02/11/18 1100  Clinical Encounter Type  Visited With Patient  Visit Type Initial  Referral From Nurse  Consult/Referral To Chaplain  Spiritual Encounters  Spiritual Needs Emotional  Stress Factors  Patient Stress Factors Exhausted  Family Stress Factors None identified    Pt was not in his room when I arrived. Pt had gone out for a certain procedure. He later came back and I helped bring the witnesses and notary to complete his POA paperwork. Patient was very appreciative of Chaplain's visit. I provided compassionate presence.  Blaise Grieshaber a Water quality scientist, [employer]

## 2018-02-11 NOTE — Progress Notes (Signed)
Confirmed with Infectious disease that droplet precautions could be d/ced. Pt has received 12 doses of tamiflu and has not had a fever within the past 24 hours.

## 2018-02-11 NOTE — Progress Notes (Signed)
RN contacted respiratory about PFTs,  Informed them that he was finished with his tamiflu dosage and has been fever free for over 24 hours. Respiratory stated that they would complete the PFTs at some poin this afternoon.  RN contacted vascular about the completion of doppler studies. Verbalized that the order was placed on Saturday feb 23. Vascular said that they hope to get to him tomorrow (feb 27) as they are "playing catch up."

## 2018-02-11 NOTE — Progress Notes (Signed)
ANTICOAGULATION CONSULT NOTE - Follow Up Consult  Pharmacy Consult for Heparin Indication: chest pain/ACS, CAD, awaiting CABG + AVR  No Known Allergies  Patient Measurements: Height: 5\' 6"  (167.6 cm) Weight: 164 lb 11.2 oz (74.7 kg) IBW/kg (Calculated) : 63.8 Heparin Dosing Weight: 75 kg  Vital Signs: Temp: 98.5 F (36.9 C) (02/26 0455) Temp Source: Oral (02/26 0455) BP: 138/68 (02/26 0829) Pulse Rate: 87 (02/26 0455)  Labs: Recent Labs    02/09/18 0327 02/10/18 0400 02/11/18 0312  HGB 15.6 15.0 14.2  HCT 45.4 44.4 41.6  PLT 220 223 238  HEPARINUNFRC 0.42 0.36 0.33  CREATININE 0.90  --   --     Estimated Creatinine Clearance: 66 mL/min (by C-G formula based on SCr of 0.9 mg/dL).  Assessment:  51 yoM consulted for heparin for ACS. Transfer from [facility] where he presented with pulmonary edema and NSTEMI. Planning CABG/AVR when able.    Heparin level is low therapeutic (0.33) on 1500 units/hr.  Heparin levels have trended down over the last 2 days on current rate.  Goal of Therapy:  Heparin level 0.3-0.7 units/ml Monitor platelets by anticoagulation protocol: Yes   Plan:   Increase heparin drip to 1600 units/hr to try to keep level in target range.  Daily heparin level and CBC while on heparin.  Had 4 doses/2 days of Tamiflu at [facility] prior to transfer and has had 8 doses/4 more days at [facility].  Total of 6 days of Tamiflu when 5 were planned.    Tamiflu discontinued as course is complete.  Day # 3 of 5 Azithromycin.  Last dose due 2/28 am.  Dennie Fetters, [place] Pager: [phone] 02/11/2018,11:29 AM

## 2018-02-11 NOTE — Progress Notes (Signed)
Inpatient Diabetes Program Recommendations  AACE/ADA: New Consensus Statement on Inpatient Glycemic Control (2015)  Target Ranges:  Prepandial:   less than 140 mg/dL      Peak postprandial:   less than 180 mg/dL (1-2 hours)      Critically ill patients:  140 - 180 mg/dL   Lab Results  Component Value Date   GLUCAP 172 (H) 02/11/2018   HGBA1C 7.0 (H) 02/05/2018    Review of Glycemic Control:  Results for Harold Parker, Harold Parker (MRN [id number]) as of 02/11/2018 10:27  Ref. Range 02/10/2018 07:28 02/10/2018 11:16 02/10/2018 16:42 02/10/2018 20:58 02/11/2018 07:59  Glucose-Capillary Latest Ref Range: 65 - 99 mg/dL 106 (H) 269 (H) 485 (H) 185 (H) 172 (H)    Diabetes history: Type 2 DM Outpatient Diabetes medications:  None Current orders for Inpatient glycemic control:  Novolog moderate tid with meals Inpatient Diabetes Program Recommendations:   Please consider adding Lantus 12 units daily and add Novolog 3 units tid with meals while in the hospital.    Thanks,  Beryl Meager, RN, BC-ADM Inpatient Diabetes Coordinator Pager [phone] (8a-5p)

## 2018-02-11 NOTE — Consult Note (Addendum)
Name: Harold Parker MRN: [id number] DOB: 10-20-1946    ADMISSION DATE:  02/07/2018 CONSULTATION DATE: 02/11/2018  REFERRING MD : Barry Dienes  CHIEF COMPLAINT: Shortness of breath  BRIEF PATIENT DESCRIPTION: 72 year old male who is on oxygen and remains hypoxic.  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 He was in his usual state of health until he noticed fever and chills and general malaise.  He went to his primary care physician and was treated with steroids and antimicrobial therapy.  Subsequently had a flu panel drawn on 02/03/2018 was a flu a positive.  He was started on Tamiflu but did not feel a subscription.  Continues to decline and went to the emergency room with increasing shortness of breath general malaise and failure to thrive.  On evaluation in the emergency department he was found to be positive troponins 2D echo revealed lowered EF of 40% and valvular dysfunction.  He has a history of coronary artery disease and had a stent in 1998.  He was transferred to [facility] for cardiac evaluation and cardiac cath.  Cardiac catheter revealed extensive three-vessel disease.  This was not amenable to stenting and he was arranged for cardiovascular thoracic surgeon to evaluate.  He has continued to be hypoxic with a PO2 in the 50s despite being on oxygen.  Continues to have some fine chills.  He has been treated with Tamiflu.  CT scan performed on 02/11/2018 which was ordered for more cardiac evaluation and pulmonary evaluation.  CT reveals bilateral branching fine nodular pattern most consistent with pulmonary infection.  Also notes peribronchial thickening along the central airways also consistent with infection or inflammation.  Pulmonary critical care has been asked  to evaluate for preop evaluation for open heart surgery for coronary bypass grafting.  Pulmonary function tests have been ordered but not done as of yet.  I suspect this is pulmonary function testing will be inaccurate due to his current respiratory distress secondary to his flu and infectious state.  As noted he is hypoxic at this time.  I suspect he will need more time being treated with diuresis Along with Tamiflu which is ready to complete Zithromax.  He is not bronchospastic at this time therefore no need for prednisone.  He is not ready for cardiovascular thoracic surgery at this time and does have the potential to become worse and may need to be moved to the stepdown versus intensive care unit in the future if he does not respond to current interventions.     PAST MEDICAL HISTORY :   has a past medical history of Aortic stenosis, Bilateral carotid bruits, CAD (coronary artery disease), Chronic bronchitis (HCC), Chronic lower back pain, COPD (chronic obstructive pulmonary disease) (HCC), Heart murmur, HTN (hypertension), Hypercholesteremia, Ischemic cardiomyopathy, Moderate aortic stenosis, Myocardial infarction (HCC) (~ 1998/1999), Tobacco abuse, and Type II diabetes mellitus (HCC).  has a past surgical history that includes LEFT HEART CATH AND CORONARY ANGIOGRAPHY (N/A, 02/06/2018); Tonsillectomy; Colonoscopy; and Coronary angioplasty with stent (~ 1998/1999). Prior to Admission medications   Medication Sig Start Date End Date Taking? Authorizing Provider  amLODipine (NORVASC) 2.5 MG tablet Take 2.5 mg by mouth 2 (two) times daily.   Yes [provider]  Ascorbic Acid (VITAMIN C) 1000 MG tablet  Take 1,000 mg by mouth daily.   Yes [provider]  aspirin 325 MG EC tablet Take 325 mg by mouth daily.   Yes [provider]  atorvastatin (LIPITOR) 40 MG tablet Take 1 tablet (40 mg total) by mouth daily at 6 PM. 02/06/18  Yes Shaune Pollack, MD  ezetimibe (ZETIA) 10 MG tablet  Take 10 mg by mouth daily.   Yes [provider]  folic acid (FOLVITE) 400 MCG tablet Take 400 mcg by mouth every evening.   Yes [provider]  isosorbide mononitrate (IMDUR) 60 MG 24 hr tablet Take 60 mg by mouth daily.   Yes [provider]  losartan (COZAAR) 100 MG tablet Take 1 tablet (100 mg total) by mouth daily. 02/08/18  Yes Shaune Pollack, MD  Nutritional Supplements (GRAPESEED EXTRACT) 500-50 MG CAPS Take 1 tablet by mouth 2 (two) times daily.   Yes [provider]  predniSONE (DELTASONE) 50 MG tablet taper 02/07/18  Yes Shaune Pollack, MD  Resveratrol 250 MG CAPS Take 500 mg by mouth 2 (two) times daily.   Yes [provider]  VITAMIN D, CHOLECALCIFEROL, PO Take by mouth every morning.   Yes [provider]  chlorpheniramine-HYDROcodone (TUSSIONEX PENNKINETIC ER) 10-8 MG/5ML SUER Take 5 mLs by mouth 2 (two) times daily. Patient not taking: Reported on 02/08/2018 02/03/18   Emily Filbert, MD  furosemide (LASIX) 10 MG/ML injection Inject 2 mLs (20 mg total) into the vein every 12 (twelve) hours. 02/07/18   Shaune Pollack, MD  heparin 100-0.45 UNIT/ML-% infusion Inject 1,200 Units/hr into the vein continuous. Patient not taking: Reported on 02/08/2018 02/07/18   Shaune Pollack, MD  ipratropium-albuterol (DUONEB) 0.5-2.5 (3) MG/3ML SOLN Take 3 mLs by nebulization every 6 (six) hours as needed. Patient not taking: Reported on 02/08/2018 02/03/18   Emily Filbert, MD   No Known Allergies  FAMILY HISTORY:  family history includes Lymphoma in his mother; Peripheral vascular disease in his father. SOCIAL HISTORY:  reports that he quit smoking 9 days ago. His smoking use included cigarettes. He has a 15.00 pack-year smoking history. he has never used smokeless tobacco. He reports that he does not drink alcohol or use drugs.  REVIEW OF SYSTEMS:   10 point review of system taken, please see HPI for positives and negatives.  SUBJECTIVE:    72 year old male who does not look as sick as his labs portray him.  VITAL SIGNS: Temp:  [98.5 F (36.9 C)-98.9 F (37.2 C)] 98.5 F (36.9 C) (02/26 0455) Pulse Rate:  [87-98] 87 (02/26 0455) Resp:  [18-21] 21 (02/26 0455) BP: (118-160)/(55-68) 138/68 (02/26 0829) SpO2:  [94 %-96 %] 95 % (02/26 0801) Weight:  [74.7 kg (164 lb 11.2 oz)] 74.7 kg (164 lb 11.2 oz) (02/26 0455)  PHYSICAL EXAMINATION: General: Well-nourished well-developed male in no acute distress at rest Neuro: Neurologically intact follows commands moves all extremities HEENT: No JVD is appreciated, oropharynx is unremarkable Cardiovascular: Heart sounds are regular regular rate and rhythm Lungs: Coarse rhonchi without wheezing bibasilar crackles noted Abdomen: Soft positive bowel sounds Musculoskeletal: Intact Skin: Warm and dry  Recent Labs  Lab 02/07/18 0419 02/08/18 0319 02/09/18 0327  NA 139 137 137  K 4.2 3.2* 3.7  CL 102 96* 97*  CO2 29 29 29   BUN 30* 24* 26*  CREATININE 0.91 0.75 0.90  GLUCOSE 217* 150* 154*   Recent Labs  Lab 02/09/18 0327 02/10/18 0400 02/11/18 0312  HGB 15.6 15.0 14.2  HCT 45.4  44.4 41.6  WBC 10.2 12.4* 14.7*  PLT 220 223 238   Ct Cardiac Morph/pulm Vein W/cm&w/o Ca Score  Result Date: 02/11/2018 EXAM: OVER-READ INTERPRETATION  CT CHEST The following report is an over-read performed by radiologist Dr. Genevive Bi of [facility] on 02/11/2018. This over-read does not include interpretation of cardiac or coronary anatomy or pathology. The coronary calcium score/coronary CTA interpretation by the cardiologist is attached. COMPARISON:  None. FINDINGS: Limited view of the lung parenchyma demonstrates fine branching nodular pattern in the LEFT and RIGHT lobe. Similar findings to a lesser degree RIGHT middle lobe. These findings suggest pulmonary infection. There is mild peribronchial thickening along the LEFT and RIGHT hilar bronchi. Airways are normal. Limited view  of the mediastinum peribronchial thickening along the LEFT and RIGHT bronchus. No mediastinal adenopathy. Esophagus. Limited view of the upper abdomen unremarkable. Limited view of the skeleton and chest wall is unremarkable. IMPRESSION: 1. Bilateral branching fine nodular pattern most consistent pulmonary infection. 2. Peribronchial thickening along the central airways also consistent with infection or inflammation. These results will be called to the ordering clinician or representative by the Radiologist Assistant, and communication documented in the PACS or zVision Dashboard. Electronically Signed   By: Genevive Bi M.D.   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   NSTEMI (non-ST elevated myocardial infarction) (HCC)   COPD exacerbation (HCC)   CAD (coronary artery disease)   Acute systolic CHF (congestive heart failure) (HCC)   Ischemic cardiomyopathy   Type II diabetes mellitus (HCC)   Hyperlipidemia LDL goal <70   Tobacco abuse life long smoker 1PPD   COPD (chronic obstructive pulmonary disease) (HCC)   Aortic stenosis   Bronchitis, chronic obstructive, with exacerbation (HCC)    Influenza with respiratory manifestation + flu A 02/03/18  Intake/Output Summary (Last 24 hours) at 02/11/2018 1100 Last data filed at 02/11/2018 1048 Gross per 24 hour  Intake 1200 ml  Output 1225 ml  Net -25 ml    Discussion: Harold Parker is a 72 year old male, 1 pack a day smoker since age of 25, 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 He was in his usual state of health until he noticed fever and chills and general malaise.  He went to his primary care physician and was treated with steroids and antimicrobial therapy.  Subsequently had a flu panel drawn on 02/03/2018 was a flu a positive.  He was started on Tamiflu but did not feel a subscription.  Continues to  decline and went to the emergency room with increasing shortness of breath general malaise and failure to thrive.  On evaluation in the emergency department he was found to be positive troponins 2D echo revealed lowered EF of 40% and valvular dysfunction.  He has a history of coronary artery disease and had a stent in 1998.  He was transferred to [facility] for cardiac evaluation and cardiac cath.  Cardiac catheter revealed extensive three-vessel disease.  This was not amenable to stenting and he was arranged for cardiovascular thoracic surgeon to evaluate.  He has continued to be hypoxic with a PO2 in the 50s despite being on oxygen.  Continues to have some fine chills.  He has been treated with Tamiflu.  CT scan performed on 02/11/2018 which was ordered for more cardiac evaluation and pulmonary evaluation.  CT reveals bilateral branching fine nodular  pattern most consistent with pulmonary infection.  Also notes peribronchial thickening along the central airways also consistent with infection or inflammation.  Pulmonary critical care has been asked to evaluate for preop evaluation for open heart surgery for coronary bypass grafting.  Pulmonary function tests have been ordered but not done as of yet.  I suspect this is pulmonary function testing will be inaccurate due to his current respiratory distress secondary to his flu and infectious state.  As noted he is hypoxic at this time.  I suspect he will need more time being treated with diuresis Along with Tamiflu which is ready to complete Zithromax.  He is not bronchospastic at this time therefore no need for prednisone.  He is not ready for cardiovascular thoracic surgery at this time and does have the potential to become worse and may need to be moved to the stepdown versus intensive care unit in the future if he does not respond to current interventions.   PLAN: Complete Tamiflu  Continue Zithromax consider broadening antibiotics as white  counts continue to rise.  Pulmonary function tests have been ordered suspect that the readings will be inconsistent to the acute process currently going on  If he declines may need to go to the intensive care unit.  Oxygen as needed.   Brett Canales Minor ACNP Adolph Pollack PCCM Pager [phone] till 1 pm If no answer page [phone] 02/11/2018, 11:07 AM  Attending Note:  72 year old male with history of O2 dependent COPD who presents to [facility] for evaluation prior to cardiac surgery.  On exam, bibasilar crackles noted.  I reviewed CXR myself, worsening pulmonary edema and pleural effusions noted.  Discussed with PCCM-NP.  COPD: no wheezing  - BD as ordered  - No steroids  Flu:  - Complete course of tamiflu  CAP:  - Finish zithromax  - Check PCT, if worsening will consider broader spectrum  Pleural effusion  - Diureses  Pulmonary edema:  - Lasix  PFTs pending, new CXR pending and PCT pending  PCCM will follow.  Patient seen and examined, agree with above note.  I dictated the care and orders written for this patient under my direction.  Alyson Reedy, MD [phone]

## 2018-02-12 ENCOUNTER — Inpatient Hospital Stay (HOSPITAL_COMMUNITY): Payer: Medicare Other

## 2018-02-12 ENCOUNTER — Encounter (HOSPITAL_COMMUNITY): Payer: Self-pay | Admitting: Anesthesiology

## 2018-02-12 DIAGNOSIS — I350 Nonrheumatic aortic (valve) stenosis: Secondary | ICD-10-CM

## 2018-02-12 DIAGNOSIS — I2510 Atherosclerotic heart disease of native coronary artery without angina pectoris: Secondary | ICD-10-CM

## 2018-02-12 DIAGNOSIS — Z01810 Encounter for preprocedural cardiovascular examination: Secondary | ICD-10-CM

## 2018-02-12 LAB — CBC
HCT: 38.7 % — ABNORMAL LOW (ref 39.0–52.0)
Hemoglobin: 13.7 g/dL (ref 13.0–17.0)
MCH: 31.9 pg (ref 26.0–34.0)
MCHC: 35.4 g/dL (ref 30.0–36.0)
MCV: 90.2 fL (ref 78.0–100.0)
Platelets: 293 10*3/uL (ref 150–400)
RBC: 4.29 MIL/uL (ref 4.22–5.81)
RDW: 13.6 % (ref 11.5–15.5)
WBC: 14.8 10*3/uL — ABNORMAL HIGH (ref 4.0–10.5)

## 2018-02-12 LAB — BASIC METABOLIC PANEL
Anion gap: 8 (ref 5–15)
BUN: 17 mg/dL (ref 6–20)
CO2: 28 mmol/L (ref 22–32)
Calcium: 8.7 mg/dL — ABNORMAL LOW (ref 8.9–10.3)
Chloride: 100 mmol/L — ABNORMAL LOW (ref 101–111)
Creatinine, Ser: 0.79 mg/dL (ref 0.61–1.24)
GFR calc Af Amer: >60 mL/min (ref >60)
GFR calc non Af Amer: >60 mL/min (ref >60)
Glucose, Bld: 247 mg/dL — ABNORMAL HIGH (ref 65–99)
Potassium: 3.6 mmol/L (ref 3.5–5.1)
Sodium: 136 mmol/L (ref 135–145)

## 2018-02-12 LAB — PULMONARY FUNCTION TEST
FEF 25-75 Post: 0.51 L/sec
FEF 25-75 Pre: 0.43 L/sec
FEF2575-%Change-Post: 18 %
FEF2575-%Pred-Post: 27 %
FEF2575-%Pred-Pre: 23 %
FEV1-%Change-Post: 1 %
FEV1-%Pred-Post: 37 %
FEV1-%Pred-Pre: 37 %
FEV1-Post: 0.95 L
FEV1-Pre: 0.93 L
FEV1FVC-%Change-Post: -12 %
FEV1FVC-%Pred-Pre: 80 %
FEV6-%Change-Post: 14 %
FEV6-%Pred-Post: 54 %
FEV6-%Pred-Pre: 47 %
FEV6-Post: 1.77 L
FEV6-Pre: 1.55 L
FEV6FVC-%Change-Post: -1 %
FEV6FVC-%Pred-Post: 103 %
FEV6FVC-%Pred-Pre: 105 %
FVC-%Change-Post: 15 %
FVC-%Pred-Post: 52 %
FVC-%Pred-Pre: 45 %
FVC-Post: 1.83 L
FVC-Pre: 1.58 L
Post FEV1/FVC ratio: 52 %
Post FEV6/FVC ratio: 97 %
Pre FEV1/FVC ratio: 59 %
Pre FEV6/FVC Ratio: 98 %

## 2018-02-12 LAB — GLUCOSE, CAPILLARY
Glucose-Capillary: 154 mg/dL — ABNORMAL HIGH (ref 65–99)
Glucose-Capillary: 175 mg/dL — ABNORMAL HIGH (ref 65–99)
Glucose-Capillary: 179 mg/dL — ABNORMAL HIGH (ref 65–99)
Glucose-Capillary: 162 mg/dL — ABNORMAL HIGH (ref 65–99)

## 2018-02-12 LAB — PROCALCITONIN: Procalcitonin: <0.1 ng/mL

## 2018-02-12 LAB — HEPARIN LEVEL (UNFRACTIONATED): Heparin Unfractionated: 0.4 IU/mL (ref 0.30–0.70)

## 2018-02-12 NOTE — Progress Notes (Signed)
ANTICOAGULATION CONSULT NOTE - Follow Up Consult  Pharmacy Consult for Heparin Indication: chest pain/ACS, CAD, awaiting CABG + AVR  No Known Allergies  Patient Measurements: Height: 5\' 6"  (167.6 cm) Weight: 165 lb 4.8 oz (75 kg) IBW/kg (Calculated) : 63.8 Heparin Dosing Weight: 75 kg  Vital Signs: Temp: 98 F (36.7 C) (02/27 0450) BP: 149/66 (02/27 0450) Pulse Rate: 101 (02/27 0836)  Labs: Recent Labs    02/10/18 0400 02/11/18 0312 02/12/18 0321 02/12/18 0934  HGB 15.0 14.2 13.7  --   HCT 44.4 41.6 38.7*  --   PLT 223 238 293  --   HEPARINUNFRC 0.36 0.33 0.40  --   CREATININE  --   --   --  0.79    Estimated Creatinine Clearance: 74.2 mL/min (by C-G formula based on SCr of 0.79 mg/dL).  Assessment:  8 yoM consulted for heparin for ACS. Transfer from [facility] where he presented with pulmonary edema and NSTEMI. Planning CABG/AVR when able.    Heparin level is therapeutic (0.40) on 1600 units/hr.   Goal of Therapy:  Heparin level 0.3-0.7 units/ml Monitor platelets by anticoagulation protocol: Yes   Plan:   Continue heparin drip at 1600.  Daily heparin level and CBC while on heparin.  Had 4 doses/2 days of Tamiflu at [facility] prior to transfer and has had 8 doses/4 more days at [facility].  Total of 6 days of Tamiflu when 5 were planned.    Tamiflu discontinued on 2/26 as course is complete.  Day # 4 of 5 Azithromycin.  Last dose due 2/28 am.  Dennie Fetters, [place] Pager: [phone] 02/12/2018,12:25 PM

## 2018-02-12 NOTE — Care Management Note (Addendum)
Case Management Note  Patient Details  Name: Harold Parker MRN: [id number] Date of Birth: [birth year]  Subjective/Objective: Pt presented for Flu & Nstemi-s/p cath Severe Left Main and three-vessel CAD. Plan for CABG & AVR once PFT's completed and respiratory status improves. Pt remains on IV Heparin gtt and IV Lasix for SOB. Pulmonary has been following for respiratory status. CM is following for CABG Surgery Date. PTA Independent from home in [place]-pt visiting fiance. 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   Action/Plan: CM will continue to monitor for additional needs post procedure.   Expected Discharge Date:                  Expected Discharge Plan:  Home/Self Care  In-House Referral:  NA  Discharge planning Services  CM Consult  Post Acute Care Choice:    Choice offered to:     DME Arranged:    DME Agency:     HH Arranged:    HH Agency:     Status of Service:  In process, will continue to follow  If discussed at Long Length of Stay Meetings, dates discussed:    Additional Comments: 1149 02-14-18 Tomi Bamberger, RN,BSN [phone] Pt was discussed in Quality Collaborative Meeting. Patient will need to b e HRI with [facility] for RN Services once stable for d/c. Following Respiratory Status. CM will continue to monitor for additional needs.  Gala Lewandowsky, RN 02/12/2018, 11:01 AM

## 2018-02-12 NOTE — Progress Notes (Signed)
Name: Harold Parker MRN: [id number] DOB: 08-02-1946    ADMISSION DATE:  02/07/2018 CONSULTATION DATE: 02/11/2018  REFERRING MD : Barry Dienes  CHIEF COMPLAINT: Shortness of breath  BRIEF PATIENT DESCRIPTION: 72 year old male who is on oxygen and remains hypoxic.  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 He was in his usual state of health until he noticed fever and chills and general malaise.  He went to his primary care physician and was treated with steroids and antimicrobial therapy.  Subsequently had a flu panel drawn on 02/03/2018 was a flu a positive.  He was started on Tamiflu but did not feel a subscription.  Continues to decline and went to the emergency room with increasing shortness of breath general malaise and failure to thrive.  On evaluation in the emergency department he was found to be positive troponins 2D echo revealed lowered EF of 40% and valvular dysfunction.  He has a history of coronary artery disease and had a stent in 1998.  He was transferred to [facility] for cardiac evaluation and cardiac cath.  Cardiac catheter revealed extensive three-vessel disease.  This was not amenable to stenting and he was arranged for cardiovascular thoracic surgeon to evaluate.  He has continued to be hypoxic with a PO2 in the 50s despite being on oxygen.  Continues to have some fine chills.  He has been treated with Tamiflu.   SUBJECTIVE:  Feels good.  Wants to have his surgery. VITAL SIGNS: Temp:  [98 F (36.7 C)-98.7 F (37.1 C)] 98 F (36.7 C) (02/27 0450) Pulse Rate:  [82-101] 101 (02/27 0836) Resp:  [20-21] 20 (02/27 0450) BP: (113-149)/(66-94) 149/66 (02/27 0450) SpO2:  [94 %-97 %] 94 % (02/27 0754) Weight:  [165 lb 4.8 oz (75 kg)] 165 lb 4.8 oz (75 kg)  (02/27 0834)  PHYSICAL EXAMINATION: General: This is 72 year old male patient currently resting in bed no acute distress HEENT: Normocephalic atraumatic no jugular venous distention Pulmonary: Clear to auscultation without wheeze no accessory use Cardiac: Regular rate and rhythm Abdomen: Soft nontender Extremities: Brisk cap refill no edema  neuro: Awake oriented no focal deficits  Recent Labs  Lab 02/08/18 0319 02/09/18 0327 02/12/18 0934  NA 137 137 136  K 3.2* 3.7 3.6  CL 96* 97* 100*  CO2 29 29 28   BUN 24* 26* 17  CREATININE 0.75 0.90 0.79  GLUCOSE 150* 154* 247*   Recent Labs  Lab 02/10/18 0400 02/11/18 0312 02/12/18 0321  HGB 15.0 14.2 13.7  HCT 44.4 41.6 38.7*  WBC 12.4* 14.7* 14.8*  PLT 223 238 293   Ct Cardiac Morph/pulm Vein W/cm&w/o Ca Score  Addendum Date: 02/11/2018   ADDENDUM REPORT: 02/11/2018 17:26 CLINICAL DATA:  72 year old male with severe aortic stenosis being evaluated for an aortic valve replacement. EXAM: Cardiac TAVR CT TECHNIQUE: The patient was scanned on a [employer]. A 120 kV retrospective scan was triggered in the descending thoracic aorta at 111 HU's. Gantry rotation speed was 250 msecs and collimation was .6 mm. 10 mg of iv Metoprolol and no nitro were given. The 3D data set was reconstructed in 5% intervals of the R-R cycle. Systolic and diastolic phases were analyzed on a dedicated work station using MPR, MIP and VRT modes.  The patient received 80 cc of contrast. FINDINGS: Aortic Valve: Trileaflet, severely thickened and calcified predominantly in the non-coronary cusp with moderately reduced leaflet opening. There are no calcifications in the LVOT. There is a calcification posteriorly to the aortic valve sinuses measuring 13 x 8 mm. Aorta: Normal size, moderate diffuse calcifications and atherosclerotic plaque. Sinotubular Junction: 29 x 27 mm Ascending Thoracic Aorta: 32 x 31 mm Aortic Arch: Not visualized Descending Thoracic Aorta:  23 x 23 mm Sinus of Valsalva Measurements: Non-coronary: 33 mm Right -coronary: 33 mm Left -coronary: 35 mm Coronary Artery Height above Annulus: Left Main: 13 mm Right Coronary: 20 mm Virtual Basal Annulus Measurements: Maximum/Minimum Diameter: 29.8 x 24.7 mm Perimeter: 84.4 mm Area: 550 mm2 IMPRESSION: 1. Trileaflet, severely thickened and calcified aortic valve with moderately reduced leaflet opening. There are no calcifications in the LVOT. There is a calcification posteriorly to the aortic valve sinuses measuring 13 x 8 mm. 2. Thoracic aorta: normal size, moderate diffuse calcifications and atherosclerotic plaque. 3.  No thrombus in the left atrial appendage. 4.  Mildly dilated pulmonary artery measuring 32 mm. Electronically Signed   By: Tobias Alexander   On: 02/11/2018 17:26   Result Date: 02/11/2018 EXAM: OVER-READ INTERPRETATION  CT CHEST The following report is an over-read performed by radiologist Dr. Genevive Bi of [facility] on 02/11/2018. This over-read does not include interpretation of cardiac or coronary anatomy or pathology. The coronary calcium score/coronary CTA interpretation by the cardiologist is attached. COMPARISON:  None. FINDINGS: Limited view of the lung parenchyma demonstrates fine branching nodular pattern in the LEFT and RIGHT lobe. Similar findings to a lesser degree RIGHT middle lobe. These findings suggest pulmonary infection. There is mild peribronchial thickening along the LEFT and RIGHT hilar bronchi. Airways are normal. Limited view of the mediastinum peribronchial thickening along the LEFT and RIGHT bronchus. No mediastinal adenopathy. Esophagus. Limited view of the upper abdomen unremarkable. Limited view of the skeleton and chest wall is unremarkable. IMPRESSION: 1. Bilateral branching fine nodular pattern most consistent pulmonary infection. 2. Peribronchial thickening along the central airways also consistent with infection or inflammation. These  results will be called to the ordering clinician or representative by the Radiologist Assistant, and communication documented in the PACS or zVision Dashboard. Electronically Signed: By: Genevive Bi M.D. On: 02/11/2018 10:03   Dg Chest Port 1 View  Result Date: 02/12/2018 CLINICAL DATA:  Shortness of breath, cough. EXAM: PORTABLE CHEST 1 VIEW COMPARISON:  Radiographs of February 05, 2018. FINDINGS: The heart size and mediastinal contours are within normal limits. No pneumothorax or pleural effusion is noted. Right lung is clear. Mild left basilar subsegmental atelectasis or scarring is noted. The visualized skeletal structures are unremarkable. IMPRESSION: Lungs are significantly improved compared to prior exam. Mild left basilar subsegmental atelectasis or scarring is noted. Electronically Signed   By: Lupita Raider, M.D.   On: 02/12/2018 10:21    ASSESSMENT: Acute respiratory failure with hypoxia (HCC)Works as fireman ? Exposures. NSTEMI (non-ST elevated myocardial infarction) (HCC) COPD exacerbation (HCC) CAD (coronary artery disease) Acute systolic CHF (congestive heart failure) (HCC) Ischemic cardiomyopathy Type II diabetes mellitus (HCC) Hyperlipidemia LDL goal <70 Tobacco abuse life long smoker 1PPD Aortic stenosis Bronchitis, chronic obstructive, with exacerbation (HCC) Influenza with respiratory manifestation + flu A 02/03/18    72 year old male with history of O2 dependent COPD. PCCM asked to see  for evaluation prior to cardiac surgery.    COPD: no wheezing ->PFTs suggesting marked obstructive  disease but what appears to be cough mechanics make accurate interpretation difficult. Also not ideal to assess this in the acute phase of illness.  Plan Cont BDs  Flu: Plan  Completed tamiflu   CAP (NOS): Plan For azithro X 5 d F/u PCT   Pleural effusion & Pulmonary edema: PCXR personally reviewed: showing improved bilateral aeration.  Plan Lasix as BP/BUN/cr allow      CAD Pulmonary rec Given his significant leukocytosis and slow to resolve acute illness he would be a very high risk surgical candidate and would be best served by recovering from acute illness prior to undergoing bypass surgery; after recovery from illness he would remain mod-to high risk given his underlying lung disease but surgery would not be contra-indicated from a pulmonary stand-point.   Simonne Martinet ACNP-BC [facility] Pulmonary/Critical Care Pager # [phone] OR # [phone] if no answer   Simonne Martinet, NP

## 2018-02-12 NOTE — Progress Notes (Signed)
      [address]       [name] [address]             [phone]     CARDIOTHORACIC SURGERY PROGRESS NOTE  Subjective: No complaints.  Appetite starting to improve.  Objective: Vital signs in last 24 hours: Temp:  [98 F (36.7 C)-98.8 F (37.1 C)] 98.8 F (37.1 C) (02/27 1348) Pulse Rate:  [82-101] 94 (02/27 1348) Cardiac Rhythm: Normal sinus rhythm (02/27 0800) Resp:  [20-21] 20 (02/27 0450) BP: (113-149)/(62-94) 145/62 (02/27 1348) SpO2:  [93 %-97 %] 93 % (02/27 1348) Weight:  [165 lb 4.8 oz (75 kg)] 165 lb 4.8 oz (75 kg) (02/27 0834)  Physical Exam:  Rhythm:   sinus  Breath sounds: Coarse w/ scattered rhonchi  Heart sounds:  RRR w/ systolic murmur  Incisions:  n/a  Abdomen:  soft  Extremities:  warm   Intake/Output from previous day: 02/26 0701 - 02/27 0700 In: 1070 [P.O.:1070] Out: 1000 [Urine:1000] Intake/Output this shift: Total I/O In: 680 [P.O.:680] Out: 375 [Urine:375]  Lab Results: Recent Labs    02/11/18 0312 02/12/18 0321  WBC 14.7* 14.8*  HGB 14.2 13.7  HCT 41.6 38.7*  PLT 238 293   BMET:  Recent Labs    02/12/18 0934  NA 136  K 3.6  CL 100*  CO2 28  GLUCOSE 247*  BUN 17  CREATININE 0.79  CALCIUM 8.7*    CBG (last 3)  Recent Labs    02/12/18 0745 02/12/18 1114 02/12/18 1624  GLUCAP 154* 175* 179*   PT/INR:  No results for input(s): LABPROT, INR in the last 72 hours.  CXR:  N/A  Pulmonary Function Tests  Baseline      Post-bronchodilator  FVC  1.58 L  (45% predicted) FVC  1.83 L  (52% predicted) FEV1  0.93 L  (37% predicted) FEV1  0.95 L  (37% predicted) FEF25-75 0.43 L  (23% predicted) FEF25-75 0.51 L  (27% predicted)  TLC  Not done RV  Not done DLCO  Not done     Assessment/Plan: S/P Procedure(s) (LRB): CORONARY ARTERY BYPASS GRAFTING (CABG) (N/A) AORTIC VALVE REPLACEMENT (AVR) (N/A) TRANSESOPHAGEAL ECHOCARDIOGRAM (TEE) (N/A)  Input from Dr. Molli Knock and CCM team appreciated As expected,  PFT's c/w severe COPD O2 requirement seems to be getting worse WBC rising Procalcitonin pending  I don't think Mr Barrentine is going to be ready for surgery soon.  He's clearly not ready now and won't be this week.  He may need antibiotics broadened.    Risks associated with AVR + CABG will be very high under optimal conditions.  He is not a candidate for surgery under suboptimal conditions, such as resolving influenza and/or pneumonia.    Whether or not he can or should stay in the hospital until he is ready for surgery can be up for debate.  The patient prefers to stay.  Will follow.   I spent in excess of 15 minutes during the conduct of this hospital encounter and >50% of this time involved direct face-to-face encounter with the patient for counseling and/or coordination of their care.   Purcell Nails, MD 02/12/2018 6:18 PM

## 2018-02-12 NOTE — Progress Notes (Signed)
Preliminary results by tech - Precabg testing completed. Carotid - 1-39% in bilateral carotid arteries. Palmar Arch evaluation - bilateral doppler waveforms remain within normal limits with both radial and ulnar compressions. ABI - bilateral moderate arterial disease at rest. - right leg 0.50 and left leg, 0.71. Marilynne Halsted, BS, RDMS, RVT

## 2018-02-12 NOTE — Progress Notes (Addendum)
Progress Note  Patient Name: Harold Parker Date of Encounter: 02/12/2018  Primary Cardiologist: No primary care provider on file.  Subjective   No chest pain. Feels his breathing is getting better.   Inpatient Medications    Scheduled Meds: . amLODipine  5 mg Oral Daily  . aspirin EC  81 mg Oral Daily  . atorvastatin  80 mg Oral q1800  . azithromycin  250 mg Oral Daily  . Chlorhexidine Gluconate Cloth  6 each Topical Daily  . ezetimibe  10 mg Oral Daily  . furosemide  20 mg Intravenous BID  . insulin aspart  0-15 Units Subcutaneous TID WC  . ipratropium-albuterol  3 mL Nebulization TID  . isosorbide mononitrate  60 mg Oral Daily  . losartan  100 mg Oral Daily  . metoprolol tartrate  25 mg Oral BID  . mupirocin ointment  1 application Nasal BID  . nicotine  14 mg Transdermal Daily  . potassium chloride  20 mEq Oral BID  . sodium chloride flush  3 mL Intravenous Q12H   Continuous Infusions: . sodium chloride    . heparin 1,600 Units/hr (02/12/18 0449)   PRN Meds: sodium chloride, acetaminophen, ALPRAZolam, guaiFENesin-dextromethorphan, ipratropium-albuterol, nitroGLYCERIN, ondansetron (ZOFRAN) IV, sodium chloride flush   Vital Signs    Vitals:   02/11/18 2002 02/11/18 2139 02/12/18 0450 02/12/18 0754  BP:  (!) 113/94 (!) 149/66   Pulse:  97 82   Resp:  (!) 21 20   Temp:  98.7 F (37.1 C) 98 F (36.7 C)   TempSrc:  Oral    SpO2: 96% 97% 95% 94%  Weight:      Height:        Intake/Output Summary (Last 24 hours) at 02/12/2018 6962 Last data filed at 02/12/2018 0400 Gross per 24 hour  Intake 1070 ml  Output 900 ml  Net 170 ml   Filed Weights   02/09/18 0604 02/10/18 0500 02/11/18 0455  Weight: 164 lb 1.6 oz (74.4 kg) 130 lb 6.4 oz (59.1 kg) 164 lb 11.2 oz (74.7 kg)    Telemetry    SR with PVCs - Personally Reviewed  ECG    N/a - Personally Reviewed  Physical Exam   General: Well developed, well nourished, older W male appearing in no acute  distress. Head: Normocephalic, atraumatic.  Neck: Supple, + JVD. Lungs:  Resp regular and unlabored, Bibasilar crackles, with mild wheezing. Heart: RRR, S1, S2, 2/6 systolic murmur; no rub. Abdomen: Soft, non-tender, non-distended with normoactive bowel sounds.  Extremities: No clubbing, cyanosis, edema. Distal pedal pulses are 2+ bilaterally. Neuro: Alert and oriented X 3. Moves all extremities spontaneously. Psych: Normal affect.  Labs    Chemistry Recent Labs  Lab 02/07/18 0419 02/08/18 0319 02/09/18 0327  NA 139 137 137  K 4.2 3.2* 3.7  CL 102 96* 97*  CO2 29 29 29   GLUCOSE 217* 150* 154*  BUN 30* 24* 26*  CREATININE 0.91 0.75 0.90  CALCIUM 8.5* 8.6* 8.5*  PROT  --   --  5.8*  ALBUMIN  --   --  2.9*  AST  --   --  25  ALT  --   --  43  ALKPHOS  --   --  48  BILITOT  --   --  1.0  GFRNONAA >60 >60 >60  GFRAA >60 >60 >60  ANIONGAP 8 12 11      Hematology Recent Labs  Lab 02/10/18 0400 02/11/18 0312 02/12/18 0321  WBC 12.4* 14.7*  14.8*  RBC 4.91 4.61 4.29  HGB 15.0 14.2 13.7  HCT 44.4 41.6 38.7*  MCV 90.4 90.2 90.2  MCH 30.5 30.8 31.9  MCHC 33.8 34.1 35.4  RDW 13.2 13.1 13.6  PLT 223 238 293    Cardiac Enzymes Recent Labs  Lab 02/05/18 1555 02/05/18 2106 02/06/18 0244  TROPONINI 0.36* 1.74* 2.65*   No results for input(s): TROPIPOC in the last 168 hours.   BNPNo results for input(s): BNP, PROBNP in the last 168 hours.   DDimer No results for input(s): DDIMER in the last 168 hours.    Radiology    Ct Cardiac Morph/pulm Vein W/cm&w/o Ca Score  Addendum Date: 02/11/2018   ADDENDUM REPORT: 02/11/2018 17:26 CLINICAL DATA:  72 year old male with severe aortic stenosis being evaluated for an aortic valve replacement. EXAM: Cardiac TAVR CT TECHNIQUE: The patient was scanned on a [employer]. A 120 kV retrospective scan was triggered in the descending thoracic aorta at 111 HU's. Gantry rotation speed was 250 msecs and collimation was .6 mm. 10  mg of iv Metoprolol and no nitro were given. The 3D data set was reconstructed in 5% intervals of the R-R cycle. Systolic and diastolic phases were analyzed on a dedicated work station using MPR, MIP and VRT modes. The patient received 80 cc of contrast. FINDINGS: Aortic Valve: Trileaflet, severely thickened and calcified predominantly in the non-coronary cusp with moderately reduced leaflet opening. There are no calcifications in the LVOT. There is a calcification posteriorly to the aortic valve sinuses measuring 13 x 8 mm. Aorta: Normal size, moderate diffuse calcifications and atherosclerotic plaque. Sinotubular Junction: 29 x 27 mm Ascending Thoracic Aorta: 32 x 31 mm Aortic Arch: Not visualized Descending Thoracic Aorta: 23 x 23 mm Sinus of Valsalva Measurements: Non-coronary: 33 mm Right -coronary: 33 mm Left -coronary: 35 mm Coronary Artery Height above Annulus: Left Main: 13 mm Right Coronary: 20 mm Virtual Basal Annulus Measurements: Maximum/Minimum Diameter: 29.8 x 24.7 mm Perimeter: 84.4 mm Area: 550 mm2 IMPRESSION: 1. Trileaflet, severely thickened and calcified aortic valve with moderately reduced leaflet opening. There are no calcifications in the LVOT. There is a calcification posteriorly to the aortic valve sinuses measuring 13 x 8 mm. 2. Thoracic aorta: normal size, moderate diffuse calcifications and atherosclerotic plaque. 3.  No thrombus in the left atrial appendage. 4.  Mildly dilated pulmonary artery measuring 32 mm. Electronically Signed   By: Tobias Alexander   On: 02/11/2018 17:26   Result Date: 02/11/2018 EXAM: OVER-READ INTERPRETATION  CT CHEST The following report is an over-read performed by radiologist Dr. Genevive Bi of [facility] on 02/11/2018. This over-read does not include interpretation of cardiac or coronary anatomy or pathology. The coronary calcium score/coronary CTA interpretation by the cardiologist is attached. COMPARISON:  None. FINDINGS: Limited view of  the lung parenchyma demonstrates fine branching nodular pattern in the LEFT and RIGHT lobe. Similar findings to a lesser degree RIGHT middle lobe. These findings suggest pulmonary infection. There is mild peribronchial thickening along the LEFT and RIGHT hilar bronchi. Airways are normal. Limited view of the mediastinum peribronchial thickening along the LEFT and RIGHT bronchus. No mediastinal adenopathy. Esophagus. Limited view of the upper abdomen unremarkable. Limited view of the skeleton and chest wall is unremarkable. IMPRESSION: 1. Bilateral branching fine nodular pattern most consistent pulmonary infection. 2. Peribronchial thickening along the central airways also consistent with infection or inflammation. These results will be called to the ordering clinician or representative by the Radiologist Assistant, and  communication documented in the PACS or zVision Dashboard. Electronically Signed: By: Genevive Bi M.D. On: 02/11/2018 10:03    Cardiac Studies   Cath: 02/06/18  Conclusion     There is mild to moderate left ventricular systolic dysfunction.  LV end diastolic pressure is severely elevated.  Ost RCA to Prox RCA lesion is 100% stenosed.  Mid LM to Dist LM lesion is 85% stenosed.  Ost 1st Mrg lesion is 95% stenosed.  Prox LAD to Mid LAD lesion is 40% stenosed.  Prox LAD lesion is 50% stenosed.  Ost 1st Diag to 1st Diag lesion is 60% stenosed.   1.  Severe left main and three-vessel coronary artery disease with chronically occluded right coronary artery with left-to-right collaterals.  The coronary arteries are overall heavily calcified and diffusely diseased. 2.  Mildly reduced LV systolic function with an EF of 40%.  Echocardiogram showed wall motion abnormalities in the anterior as well as inferior walls.  3.  Severely elevated filling pressures in the setting of severely elevated blood pressure. 4.  Moderate aortic stenosis with a peak to peak gradient of 20  mmHg.  Recommendations: Transfer to [facility] for CABG and aortic valve replacement.  The patient will need to be tuned up given flu.  He also appears to be volume overloaded and blood pressure is uncontrolled.  I added losartan and will diuresis with furosemide. Heparin drip to be resumed in 8 hours.    TTE: 02/06/18  Study Conclusions  - Left ventricle: The cavity size was normal. There was mild   concentric hypertrophy. Systolic function was mildly to   moderately reduced. The estimated ejection fraction was in the   range of 40% to 45%. Severe hypokinesis of the   mid-apicalanteroseptal, anterior, and apical myocardium. Moderate   hypokinesis of the apicalinferior myocardium. Features are   consistent with a pseudonormal left ventricular filling pattern,   with concomitant abnormal relaxation and increased filling   pressure (grade 2 diastolic dysfunction). - Aortic valve: There was moderate stenosis. Mean gradient (S): 20   mm Hg. Valve area (VTI): 1.06 cm^2. Valve area (Vmax): 1.27 cm^2. - Mitral valve: Calcified annulus. There was mild regurgitation.   Valve area by continuity equation (using LVOT flow): 2.62 cm^2. - Left atrium: The atrium was moderately dilated. - Pulmonary arteries: Systolic pressure was moderately increased.   PA peak pressure: 50 mm Hg (S).  Patient Profile     72 y.o. male with a history of CAD, HTN, HL, DMII, Tob abuse, and COPDwho was [facility] with dyspnea, pulm edema, and NSTEMI, and was subsequently found to have severe distal LM dzs, along with moderate AS. Planned for CABG with AVr once stable from a respiratory stand point. TCTS and PCCM following.   Assessment & Plan    1. NSTEMI/CAD: Cath 2/21 showed severe dist LM and OM1 dzs with CTO of the RCA. Also w/ mod Ao Stenosis.Seen by TCTS with plans for CABG with AVR once improved from a respiratory standpoint. - remains on IV heparin, ASA, BB, statin, ARB  2. Essential HTN:  stable with current therapy  3. Moderate AS - CVTS plans for AVR at time of CABG  4. ICM/Acute systolic CHF -EF 46-96%EXB LVEDP cath - continue IV lasix 20mg  BID - he put out 1L overnight and is net neg 2.2L  5. Hyperlipidemia with LDL goal < 70. - continue high dose atorvastatin 80mg  daily and Zetia 10mg  daily - LDL 74 on 02/06/2018  6. 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 COPDexacerbation  -Cont inhalers/nebs - planned for PFTs today  8.  Influenza A - Tamiflu completed, no longer on droplet precautions.   9. CAP - WBC rising 10>>12.4>>14.7 - on Zithromax - CXR this am pending - PCCM following, appreciate assistance   Signed, Laverda Page, NP  02/12/2018, 8:32 AM  Pager # [phone]    I have examined the patient and reviewed assessment and plan and discussed with patient.  Agree with above as stated.  Check electrolytes.  Pulmonary helping to optimize respiratory status in anticipation of surgery on Friday.  Continue lipid lowering therapy.  He neds to stop smoking longterm.    Lasix to prevent volume overload.    Flu has been treated.  Lance Muss   For questions or updates, please contact [facility] Please consult [email or web address] for contact info under Cardiology/STEMI.

## 2018-02-13 DIAGNOSIS — J9601 Acute respiratory failure with hypoxia: Secondary | ICD-10-CM

## 2018-02-13 DIAGNOSIS — Z01811 Encounter for preprocedural respiratory examination: Secondary | ICD-10-CM

## 2018-02-13 LAB — BASIC METABOLIC PANEL
Anion gap: 10 (ref 5–15)
BUN: 13 mg/dL (ref 6–20)
CO2: 29 mmol/L (ref 22–32)
Calcium: 8.9 mg/dL (ref 8.9–10.3)
Chloride: 99 mmol/L — ABNORMAL LOW (ref 101–111)
Creatinine, Ser: 0.81 mg/dL (ref 0.61–1.24)
GFR calc Af Amer: >60 mL/min (ref >60)
GFR calc non Af Amer: >60 mL/min (ref >60)
Glucose, Bld: 181 mg/dL — ABNORMAL HIGH (ref 65–99)
Potassium: 4.2 mmol/L (ref 3.5–5.1)
Sodium: 138 mmol/L (ref 135–145)

## 2018-02-13 LAB — CBC
HCT: 39.2 % (ref 39.0–52.0)
Hemoglobin: 13.5 g/dL (ref 13.0–17.0)
MCH: 30.9 pg (ref 26.0–34.0)
MCHC: 34.4 g/dL (ref 30.0–36.0)
MCV: 89.7 fL (ref 78.0–100.0)
Platelets: 254 10*3/uL (ref 150–400)
RBC: 4.37 MIL/uL (ref 4.22–5.81)
RDW: 13.1 % (ref 11.5–15.5)
WBC: 14.4 10*3/uL — ABNORMAL HIGH (ref 4.0–10.5)

## 2018-02-13 LAB — GLUCOSE, CAPILLARY
Glucose-Capillary: 163 mg/dL — ABNORMAL HIGH (ref 65–99)
Glucose-Capillary: 207 mg/dL — ABNORMAL HIGH (ref 65–99)
Glucose-Capillary: 162 mg/dL — ABNORMAL HIGH (ref 65–99)
Glucose-Capillary: 191 mg/dL — ABNORMAL HIGH (ref 65–99)

## 2018-02-13 LAB — RESPIRATORY PANEL BY PCR

## 2018-02-13 LAB — STREP PNEUMONIAE URINARY ANTIGEN: Strep Pneumo Urinary Antigen: NEGATIVE

## 2018-02-13 LAB — PROCALCITONIN: Procalcitonin: <0.1 ng/mL

## 2018-02-13 LAB — HEPARIN LEVEL (UNFRACTIONATED): Heparin Unfractionated: 0.43 IU/mL (ref 0.30–0.70)

## 2018-02-13 MED ORDER — SODIUM CHLORIDE 0.9 % IV SOLN
750.0000 mg | INTRAVENOUS | Status: DC
  Filled 2018-02-13: qty 750

## 2018-02-13 MED ORDER — HEPARIN SODIUM (PORCINE) 1000 UNIT/ML IJ SOLN
INTRAMUSCULAR | Status: DC
  Filled 2018-02-13: qty 30

## 2018-02-13 MED ORDER — SODIUM CHLORIDE 0.9 % IV SOLN
INTRAVENOUS | Status: DC
  Filled 2018-02-13: qty 1

## 2018-02-13 MED ORDER — TRANEXAMIC ACID (OHS) PUMP PRIME SOLUTION
2.0000 mg/kg | INTRAVENOUS | Status: DC
  Filled 2018-02-13: qty 1.48

## 2018-02-13 MED ORDER — DEXMEDETOMIDINE HCL IN NACL 400 MCG/100ML IV SOLN
0.1000 ug/kg/h | INTRAVENOUS | Status: DC
  Filled 2018-02-13: qty 100

## 2018-02-13 MED ORDER — DOPAMINE-DEXTROSE 3.2-5 MG/ML-% IV SOLN
0.0000 ug/kg/min | INTRAVENOUS | Status: DC
  Filled 2018-02-13: qty 250

## 2018-02-13 MED ORDER — NITROGLYCERIN IN D5W 200-5 MCG/ML-% IV SOLN
2.0000 ug/min | INTRAVENOUS | Status: DC
  Filled 2018-02-13: qty 250

## 2018-02-13 MED ORDER — POTASSIUM CHLORIDE 2 MEQ/ML IV SOLN
80.0000 meq | INTRAVENOUS | Status: DC
  Filled 2018-02-13: qty 40

## 2018-02-13 MED ORDER — PHENYLEPHRINE HCL 10 MG/ML IJ SOLN
30.0000 ug/min | INTRAMUSCULAR | Status: DC
  Filled 2018-02-13: qty 2

## 2018-02-13 MED ORDER — MAGNESIUM SULFATE 50 % IJ SOLN
40.0000 meq | INTRAMUSCULAR | Status: DC
  Filled 2018-02-13: qty 9.85

## 2018-02-13 MED ORDER — TIOTROPIUM BROMIDE MONOHYDRATE 18 MCG IN CAPS
18.0000 ug | ORAL_CAPSULE | Freq: Every day | RESPIRATORY_TRACT | Status: DC
  Administered 2018-02-14: 18 ug via RESPIRATORY_TRACT
  Filled 2018-02-13: qty 5

## 2018-02-13 MED ORDER — VANCOMYCIN HCL 1000 MG IV SOLR
INTRAVENOUS | Status: DC
  Filled 2018-02-13: qty 1000

## 2018-02-13 MED ORDER — SODIUM CHLORIDE 0.9 % IV SOLN
1.5000 g | INTRAVENOUS | Status: DC
  Filled 2018-02-13: qty 1.5

## 2018-02-13 MED ORDER — MOMETASONE FURO-FORMOTEROL FUM 200-5 MCG/ACT IN AERO
2.0000 | INHALATION_SPRAY | Freq: Two times a day (BID) | RESPIRATORY_TRACT | Status: DC
  Administered 2018-02-14: 2 via RESPIRATORY_TRACT
  Filled 2018-02-13: qty 8.8

## 2018-02-13 MED ORDER — IPRATROPIUM-ALBUTEROL 0.5-2.5 (3) MG/3ML IN SOLN: 3.0000 mL | Freq: Four times a day (QID) | RESPIRATORY_TRACT | Status: DC

## 2018-02-13 MED ORDER — MILRINONE LACTATE IN DEXTROSE 20-5 MG/100ML-% IV SOLN
0.1250 ug/kg/min | INTRAVENOUS | Status: DC
  Filled 2018-02-13: qty 100

## 2018-02-13 MED ORDER — IPRATROPIUM-ALBUTEROL 0.5-2.5 (3) MG/3ML IN SOLN
3.0000 mL | Freq: Two times a day (BID) | RESPIRATORY_TRACT | Status: DC
  Administered 2018-02-13 – 2018-02-14 (×3): 3 mL via RESPIRATORY_TRACT
  Filled 2018-02-13 (×2): qty 3

## 2018-02-13 MED ORDER — EPINEPHRINE PF 1 MG/ML IJ SOLN
0.0000 ug/min | INTRAVENOUS | Status: DC
  Filled 2018-02-13: qty 4

## 2018-02-13 MED ORDER — PLASMA-LYTE 148 IV SOLN
INTRAVENOUS | Status: DC
  Filled 2018-02-13: qty 2.5

## 2018-02-13 MED ORDER — VANCOMYCIN HCL 10 G IV SOLR
1250.0000 mg | INTRAVENOUS | Status: DC
  Filled 2018-02-13: qty 1250

## 2018-02-13 MED ORDER — TRANEXAMIC ACID 1000 MG/10ML IV SOLN
1.5000 mg/kg/h | INTRAVENOUS | Status: DC
  Filled 2018-02-13: qty 25

## 2018-02-13 MED ORDER — TRANEXAMIC ACID (OHS) BOLUS VIA INFUSION
15.0000 mg/kg | INTRAVENOUS | Status: DC
  Filled 2018-02-13: qty 1113

## 2018-02-13 NOTE — Progress Notes (Addendum)
Progress Note  Patient Name: Harold Parker Date of Encounter: 02/13/2018  Primary Cardiologist: No primary care provider on file.  Subjective   No chest pain.   Inpatient Medications    Scheduled Meds: . amLODipine  5 mg Oral Daily  . aspirin EC  81 mg Oral Daily  . atorvastatin  80 mg Oral q1800  . ezetimibe  10 mg Oral Daily  . furosemide  20 mg Intravenous BID  . insulin aspart  0-15 Units Subcutaneous TID WC  . ipratropium-albuterol  3 mL Nebulization TID  . isosorbide mononitrate  60 mg Oral Daily  . losartan  100 mg Oral Daily  . metoprolol tartrate  25 mg Oral BID  . nicotine  14 mg Transdermal Daily  . potassium chloride  20 mEq Oral BID  . sodium chloride flush  3 mL Intravenous Q12H   Continuous Infusions: . sodium chloride    . heparin 1,600 Units/hr (02/12/18 2207)   PRN Meds: sodium chloride, acetaminophen, ALPRAZolam, guaiFENesin-dextromethorphan, ipratropium-albuterol, nitroGLYCERIN, ondansetron (ZOFRAN) IV, sodium chloride flush   Vital Signs    Vitals:   02/12/18 2054 02/13/18 0511 02/13/18 0756 02/13/18 0825  BP: (!) 117/59 99/80  124/61  Pulse: 99 92  (!) 104  Resp: 17 15    Temp: 98.8 F (37.1 C) 98.6 F (37 C)    TempSrc: Oral Oral    SpO2: 95% 92% 95%   Weight:  163 lb 8 oz (74.2 kg)    Height:        Intake/Output Summary (Last 24 hours) at 02/13/2018 0844 Last data filed at 02/13/2018 0800 Gross per 24 hour  Intake 1358 ml  Output 2200 ml  Net -842 ml   Filed Weights   02/11/18 0455 02/12/18 0834 02/13/18 0511  Weight: 164 lb 11.2 oz (74.7 kg) 165 lb 4.8 oz (75 kg) 163 lb 8 oz (74.2 kg)    Telemetry    ST - Personally Reviewed  ECG    N/a - Personally Reviewed  Physical Exam   General: Well developed, well nourished, male appearing in no acute distress. Head: Normocephalic, atraumatic.  Neck: Supple without bruits, JVD. Lungs:  Resp regular and unlabored, CTA. Heart: Tachy, S1, S2, no S3, S4, or murmur; no  rub. Abdomen: Soft, non-tender, non-distended with normoactive bowel sounds.  Extremities: No clubbing, cyanosis, edema. Distal pedal pulses are 2+ bilaterally. Neuro: Alert and oriented X 3. Moves all extremities spontaneously. Psych: Normal affect.  Labs    Chemistry Recent Labs  Lab 02/09/18 0327 02/12/18 0934 02/13/18 0404  NA 137 136 138  K 3.7 3.6 4.2  CL 97* 100* 99*  CO2 29 28 29   GLUCOSE 154* 247* 181*  BUN 26* 17 13  CREATININE 0.90 0.79 0.81  CALCIUM 8.5* 8.7* 8.9  PROT 5.8*  --   --   ALBUMIN 2.9*  --   --   AST 25  --   --   ALT 43  --   --   ALKPHOS 48  --   --   BILITOT 1.0  --   --   GFRNONAA >60 >60 >60  GFRAA >60 >60 >60  ANIONGAP 11 8 10      Hematology Recent Labs  Lab 02/11/18 0312 02/12/18 0321 02/13/18 0404  WBC 14.7* 14.8* 14.4*  RBC 4.61 4.29 4.37  HGB 14.2 13.7 13.5  HCT 41.6 38.7* 39.2  MCV 90.2 90.2 89.7  MCH 30.8 31.9 30.9  MCHC 34.1 35.4 34.4  RDW 13.1  13.6 13.1  PLT 238 293 254    Cardiac EnzymesNo results for input(s): TROPONINI in the last 168 hours. No results for input(s): TROPIPOC in the last 168 hours.   BNPNo results for input(s): BNP, PROBNP in the last 168 hours.   DDimer No results for input(s): DDIMER in the last 168 hours.    Radiology    Ct Cardiac Morph/pulm Vein W/cm&w/o Ca Score  Addendum Date: 02/11/2018   ADDENDUM REPORT: 02/11/2018 17:26 CLINICAL DATA:  72 year old male with severe aortic stenosis being evaluated for an aortic valve replacement. EXAM: Cardiac TAVR CT TECHNIQUE: The patient was scanned on a [employer]. A 120 kV retrospective scan was triggered in the descending thoracic aorta at 111 HU's. Gantry rotation speed was 250 msecs and collimation was .6 mm. 10 mg of iv Metoprolol and no nitro were given. The 3D data set was reconstructed in 5% intervals of the R-R cycle. Systolic and diastolic phases were analyzed on a dedicated work station using MPR, MIP and VRT modes. The patient  received 80 cc of contrast. FINDINGS: Aortic Valve: Trileaflet, severely thickened and calcified predominantly in the non-coronary cusp with moderately reduced leaflet opening. There are no calcifications in the LVOT. There is a calcification posteriorly to the aortic valve sinuses measuring 13 x 8 mm. Aorta: Normal size, moderate diffuse calcifications and atherosclerotic plaque. Sinotubular Junction: 29 x 27 mm Ascending Thoracic Aorta: 32 x 31 mm Aortic Arch: Not visualized Descending Thoracic Aorta: 23 x 23 mm Sinus of Valsalva Measurements: Non-coronary: 33 mm Right -coronary: 33 mm Left -coronary: 35 mm Coronary Artery Height above Annulus: Left Main: 13 mm Right Coronary: 20 mm Virtual Basal Annulus Measurements: Maximum/Minimum Diameter: 29.8 x 24.7 mm Perimeter: 84.4 mm Area: 550 mm2 IMPRESSION: 1. Trileaflet, severely thickened and calcified aortic valve with moderately reduced leaflet opening. There are no calcifications in the LVOT. There is a calcification posteriorly to the aortic valve sinuses measuring 13 x 8 mm. 2. Thoracic aorta: normal size, moderate diffuse calcifications and atherosclerotic plaque. 3.  No thrombus in the left atrial appendage. 4.  Mildly dilated pulmonary artery measuring 32 mm. Electronically Signed   By: Tobias Alexander   On: 02/11/2018 17:26   Result Date: 02/11/2018 EXAM: OVER-READ INTERPRETATION  CT CHEST The following report is an over-read performed by radiologist Dr. Genevive Bi of [facility] on 02/11/2018. This over-read does not include interpretation of cardiac or coronary anatomy or pathology. The coronary calcium score/coronary CTA interpretation by the cardiologist is attached. COMPARISON:  None. FINDINGS: Limited view of the lung parenchyma demonstrates fine branching nodular pattern in the LEFT and RIGHT lobe. Similar findings to a lesser degree RIGHT middle lobe. These findings suggest pulmonary infection. There is mild peribronchial  thickening along the LEFT and RIGHT hilar bronchi. Airways are normal. Limited view of the mediastinum peribronchial thickening along the LEFT and RIGHT bronchus. No mediastinal adenopathy. Esophagus. Limited view of the upper abdomen unremarkable. Limited view of the skeleton and chest wall is unremarkable. IMPRESSION: 1. Bilateral branching fine nodular pattern most consistent pulmonary infection. 2. Peribronchial thickening along the central airways also consistent with infection or inflammation. These results will be called to the ordering clinician or representative by the Radiologist Assistant, and communication documented in the PACS or zVision Dashboard. Electronically Signed: By: Genevive Bi M.D. On: 02/11/2018 10:03   Dg Chest Port 1 View  Result Date: 02/12/2018 CLINICAL DATA:  Shortness of breath, cough. EXAM: PORTABLE CHEST 1 VIEW COMPARISON:  Radiographs of February 05, 2018. FINDINGS: The heart size and mediastinal contours are within normal limits. No pneumothorax or pleural effusion is noted. Right lung is clear. Mild left basilar subsegmental atelectasis or scarring is noted. The visualized skeletal structures are unremarkable. IMPRESSION: Lungs are significantly improved compared to prior exam. Mild left basilar subsegmental atelectasis or scarring is noted. Electronically Signed   By: Lupita Raider, M.D.   On: 02/12/2018 10:21    Cardiac Studies   Cath: 02/06/18  Conclusion     There is mild to moderate left ventricular systolic dysfunction.  LV end diastolic pressure is severely elevated.  Ost RCA to Prox RCA lesion is 100% stenosed.  Mid LM to Dist LM lesion is 85% stenosed.  Ost 1st Mrg lesion is 95% stenosed.  Prox LAD to Mid LAD lesion is 40% stenosed.  Prox LAD lesion is 50% stenosed.  Ost 1st Diag to 1st Diag lesion is 60% stenosed.  1. Severe left main and three-vessel coronary artery disease with chronically occluded right coronary artery with  left-to-right collaterals. The coronary arteries are overall heavily calcified and diffusely diseased. 2. Mildly reduced LV systolic function with an EF of 40%. Echocardiogram showed wall motion abnormalities in the anterior as well as inferior walls.  3. Severely elevated filling pressures in the setting of severely elevated blood pressure. 4. Moderate aortic stenosis with a peak to peak gradient of 20 mmHg.  Recommendations: Transfer to [facility] for CABG and aortic valve replacement. The patient will need to be tuned up given flu. He also appears to be volume overloaded and blood pressure is uncontrolled. I added losartan and will diuresis with furosemide. Heparin drip to be resumed in 8 hours.    TTE: 02/06/18  Study Conclusions  - Left ventricle: The cavity size was normal. There was mild concentric hypertrophy. Systolic function was mildly to moderately reduced. The estimated ejection fraction was in the range of 40% to 45%. Severe hypokinesis of the mid-apicalanteroseptal, anterior, and apical myocardium. Moderate hypokinesis of the apicalinferior myocardium. Features are consistent with a pseudonormal left ventricular filling pattern, with concomitant abnormal relaxation and increased filling pressure (grade 2 diastolic dysfunction). - Aortic valve: There was moderate stenosis. Mean gradient (S): 20 mm Hg. Valve area (VTI): 1.06 cm^2. Valve area (Vmax): 1.27 cm^2. - Mitral valve: Calcified annulus. There was mild regurgitation. Valve area by continuity equation (using LVOT flow): 2.62 cm^2. - Left atrium: The atrium was moderately dilated. - Pulmonary arteries: Systolic pressure was moderately increased. PA peak pressure: 50 mm Hg (S).  Patient Profile     72 y.o. male with a history of CAD, HTN, HL, DMII, Tob abuse, and COPDwho was admitted to [facility] on [facility]/20 with dyspnea, pulm edema, and NSTEMI, and was subsequently found to have severe distal LM  dzs, along with moderate AS. Planned for CABG with AVr once stable from a respiratory stand point. TCTS and PCCM following.   Assessment & Plan    1. NSTEMI/CAD: Cath 2/21 showed severe dist LM and OM1 dzs with CTO of the RCA. Also w/ mod Ao Stenosis.Seen by TCTS with plans for CABG with AVR once improved from a respiratory standpoint.  -remains on IV heparin, ASA, BB, statin, ARB  2. Essential HTN: stable with current therapy  3. Moderate AS - CVTSplans for AVR at time of CABG  4. ICM/Acute systolic CHF -EF 16-10%RUE LVEDP cath - continue IV lasix 20mg  BID - UOP of 2.1Lovernight and is net neg2.8L  5. Hyperlipidemia  with LDL goal <70. - continue high dose atorvastatin 80mg  daily and Zetia 10mg  daily - LDL 74 on 02/06/2018  6. 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 COPDexacerbation  -Cont inhalers/nebs - PFTs yesterday  8.  Influenza A - Tamiflu completed, no longer on droplet precautions.   9. CAP - WBC rising 10>>12.4>>14.7, though PCT <10. - completed Zithromax - CXR with improving edema - PCCM following, appreciate assistance   Signed, Laverda Page, NP  02/13/2018, 8:44 AM  Pager # [phone]   I have examined the patient and reviewed assessment and plan and discussed with patient.  Agree with above as stated.  Needs cardiac surgery.  Trying to optimize from a pulmonary standpoint.  Pulmonary feels tha the is high risk and that it may be better to wait a few weeks after his influenza infection for surgery.  Will see if his supplemental oxygen can be mionimized, arranged at home and then possible discharge with return for cardiac surgery.  Pulmonary to adjust antibiotics as well.  Lance Muss   For questions or updates, please contact [facility] Please consult [email or web address] for contact info under Cardiology/STEMI.

## 2018-02-13 NOTE — Progress Notes (Signed)
CARDIAC REHAB PHASE I   PRE:  Rate/Rhythm: 105 St  BP:  Sitting: 132/59     SaO2: 95% 1L  MODE:  Ambulation: 370 ft   POST:  Rate/Rhythm: 111 ST  BP:  Sitting: 151/59    SaO2: 94% 1L  1510-1532 Per RN, weaning O2 to maintain sats above 88%. Pt ambulated with standby assist on 1L. Steady gait. Pt with no complaints of SOB or CP. Pt instructed to report symptoms to nurse if these symptoms occur during ambulation or at rest.   Nikki Dom, RN 02/13/2018 3:25 PM

## 2018-02-13 NOTE — Progress Notes (Signed)
Name: Harold Parker MRN: [id number] DOB: 08-20-1946    ADMISSION DATE:  02/07/2018 CONSULTATION DATE: 02/11/2018  REFERRING MD : Barry Dienes  CHIEF COMPLAINT: Shortness of breath  BRIEF PATIENT DESCRIPTION: 72 year old male who is on oxygen and remains hypoxic.  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 He was in his usual state of health until he noticed fever and chills and general malaise.  He went to his primary care physician and was treated with steroids and antimicrobial therapy.  Subsequently had a flu panel drawn on 02/03/2018 was a flu a positive.  He was started on Tamiflu but did not feel a subscription.  Continues to decline and went to the emergency room with increasing shortness of breath general malaise and failure to thrive.  On evaluation in the emergency department he was found to be positive troponins 2D echo revealed lowered EF of 40% and valvular dysfunction.  He has a history of coronary artery disease and had a stent in 1998.  He was transferred to [facility] for cardiac evaluation and cardiac cath.  Cardiac catheter revealed extensive three-vessel disease.  This was not amenable to stenting and he was arranged for cardiovascular thoracic surgeon to evaluate.  He has continued to be hypoxic with a PO2 in the 50s despite being on oxygen.  Continues to have some fine chills.  He has been treated with Tamiflu.  EVENTs 2/18 0 flu A positive 02/07/2018 - admit  2/27 - Feels good.  Wants to have his surgery.    SUBJECTIVE/OVERNIGHT/INTERVAL HX 2/28 - feels he is getting better. He is on 4L [place] but when I turned it off for 10 minutes pulse ox 88%. Fev1 yesterday is < 1L.   He tells me pre-flu he had COPD NOS but could walk 1-2 flight of stairs without  stopping and could do lot of ADLs and work as Radio producer and travel all over. He was NOT on o2 at home and He has plans of relocating to [place] from [place] to live with his fiancee. HE definitely wants to have surgery despite risks  Results for Harold Parker, Harold Parker (MRN [id number]) as of 02/13/2018 09:23  Ref. Range 02/12/2018 13:02  FEV1-Post Latest Units: L 0.95  FEV1-%Pred-Post Latest Units: % 37  FEV1-%Change-Post Latest Units: % 1  Post FEV1/FVC ratio Latest Units: % 52    VITAL SIGNS: Temp:  [98.6 F (37 C)-98.8 F (37.1 C)] 98.6 F (37 C) (02/28 0511) Pulse Rate:  [92-104] 104 (02/28 0825) Resp:  [15-17] 15 (02/28 0511) BP: (99-145)/(59-80) 124/61 (02/28 0825) SpO2:  [92 %-96 %] 95 % (02/28 0756) Weight:  [74.2 kg (163 lb 8 oz)] 74.2 kg (163 lb 8 oz) (02/28 0511)  PHYSICAL EXAMINATION:  General Appearance:    Looks stable but has classicc advanced COPD features  Head:    Normocephalic, without obvious abnormality, atraumatic  Eyes:    PERRL - yes, conjunctiva/corneas - clear      Ears:    Normal external ear canals, both ears  Nose:   NG tube - no but has [place]  Throat:  ETT TUBE - no , OG tube - no  Neck:   Supple,  No enlargement/tenderness/nodules     Lungs:     Clear to  auscultation bilaterally but BARRELL CHEST and PURSE LIP BREATHING +  Chest wall:    No deformity  Heart:    S1 and S2 normal,  Abdomen:     Soft, no masses, no organomegaly  Genitalia:    Not done  Rectal:   not done  Extremities:   Extremities- no cyanosis, no clubbing     Skin:   Intact in exposed areas .      Neurologic:    Moves all 4s - yes. CAM-ICU - neg . Orientation - x3+     PULMONARY Recent Labs  Lab 02/10/18 0320 02/10/18 0730  PHART 7.462* 7.464*  PCO2ART 44.8 37.2  PO2ART 49.4* 51.4*  HCO3 31.6* 26.5  O2SAT 85.5 89.1    CBC Recent Labs  Lab 02/11/18 0312 02/12/18 0321 02/13/18 0404  HGB 14.2 13.7 13.5  HCT 41.6 38.7* 39.2  WBC 14.7* 14.8* 14.4*  PLT 238 293 254     COAGULATION No results for input(s): INR in the last 168 hours.  CARDIAC  No results for input(s): TROPONINI in the last 168 hours. No results for input(s): PROBNP in the last 168 hours.   CHEMISTRY Recent Labs  Lab 02/07/18 0419 02/08/18 0319 02/09/18 0327 02/12/18 0934 02/13/18 0404  NA 139 137 137 136 138  K 4.2 3.2* 3.7 3.6 4.2  CL 102 96* 97* 100* 99*  CO2 29 29 29 28 29   GLUCOSE 217* 150* 154* 247* 181*  BUN 30* 24* 26* 17 13  CREATININE 0.91 0.75 0.90 0.79 0.81  CALCIUM 8.5* 8.6* 8.5* 8.7* 8.9   Estimated Creatinine Clearance: 73.3 mL/min (by C-G formula based on SCr of 0.81 mg/dL).   LIVER Recent Labs  Lab 02/09/18 0327  AST 25  ALT 43  ALKPHOS 48  BILITOT 1.0  PROT 5.8*  ALBUMIN 2.9*     INFECTIOUS Recent Labs  Lab 02/12/18 1807 02/13/18 0404  PROCALCITON <0.10 <0.10     ENDOCRINE CBG (last 3)  Recent Labs    02/12/18 1624 02/12/18 2054 02/13/18 0743  GLUCAP 179* 162* 163*         IMAGING x48h  - image(s) personally visualized  -   highlighted in bold Ct Cardiac Morph/pulm Vein W/cm&w/o Ca Score  Addendum Date: 02/11/2018   ADDENDUM REPORT: 02/11/2018 17:26 CLINICAL DATA:  72 year old male with severe aortic stenosis being evaluated for an aortic valve replacement. EXAM: Cardiac TAVR CT TECHNIQUE: The patient was scanned on a [employer]. A 120 kV retrospective scan was triggered in the descending thoracic aorta at 111 HU's. Gantry rotation speed was 250 msecs and collimation was .6 mm. 10 mg of iv Metoprolol and no nitro were given. The 3D data set was reconstructed in 5% intervals of the R-R cycle. Systolic and diastolic phases were analyzed on a dedicated work station using MPR, MIP and VRT modes. The patient received 80 cc of contrast. FINDINGS: Aortic Valve: Trileaflet, severely thickened and calcified predominantly in the non-coronary cusp with moderately reduced leaflet opening. There are no calcifications in the  LVOT. There is a calcification posteriorly to the aortic valve sinuses measuring 13 x 8 mm. Aorta: Normal size, moderate diffuse calcifications and atherosclerotic plaque. Sinotubular Junction: 29 x 27 mm Ascending Thoracic Aorta: 32 x 31 mm Aortic Arch: Not visualized Descending Thoracic Aorta: 23 x 23 mm Sinus of Valsalva Measurements: Non-coronary: 33 mm Right -coronary: 33 mm Left -coronary: 35 mm Coronary Artery Height above Annulus: Left Main: 13 mm Right Coronary: 20 mm  Virtual Basal Annulus Measurements: Maximum/Minimum Diameter: 29.8 x 24.7 mm Perimeter: 84.4 mm Area: 550 mm2 IMPRESSION: 1. Trileaflet, severely thickened and calcified aortic valve with moderately reduced leaflet opening. There are no calcifications in the LVOT. There is a calcification posteriorly to the aortic valve sinuses measuring 13 x 8 mm. 2. Thoracic aorta: normal size, moderate diffuse calcifications and atherosclerotic plaque. 3.  No thrombus in the left atrial appendage. 4.  Mildly dilated pulmonary artery measuring 32 mm. Electronically Signed   By: Tobias Alexander   On: 02/11/2018 17:26   Result Date: 02/11/2018 EXAM: OVER-READ INTERPRETATION  CT CHEST The following report is an over-read performed by radiologist Dr. Genevive Bi of [facility] on 02/11/2018. This over-read does not include interpretation of cardiac or coronary anatomy or pathology. The coronary calcium score/coronary CTA interpretation by the cardiologist is attached. COMPARISON:  None. FINDINGS: Limited view of the lung parenchyma demonstrates fine branching nodular pattern in the LEFT and RIGHT lobe. Similar findings to a lesser degree RIGHT middle lobe. These findings suggest pulmonary infection. There is mild peribronchial thickening along the LEFT and RIGHT hilar bronchi. Airways are normal. Limited view of the mediastinum peribronchial thickening along the LEFT and RIGHT bronchus. No mediastinal adenopathy. Esophagus. Limited view of the  upper abdomen unremarkable. Limited view of the skeleton and chest wall is unremarkable. IMPRESSION: 1. Bilateral branching fine nodular pattern most consistent pulmonary infection. 2. Peribronchial thickening along the central airways also consistent with infection or inflammation. These results will be called to the ordering clinician or representative by the Radiologist Assistant, and communication documented in the PACS or zVision Dashboard. Electronically Signed: By: Genevive Bi M.D. On: 02/11/2018 10:03   Dg Chest Port 1 View  Result Date: 02/12/2018 CLINICAL DATA:  Shortness of breath, cough. EXAM: PORTABLE CHEST 1 VIEW COMPARISON:  Radiographs of February 05, 2018. FINDINGS: The heart size and mediastinal contours are within normal limits. No pneumothorax or pleural effusion is noted. Right lung is clear. Mild left basilar subsegmental atelectasis or scarring is noted. The visualized skeletal structures are unremarkable. IMPRESSION: Lungs are significantly improved compared to prior exam. Mild left basilar subsegmental atelectasis or scarring is noted. Electronically Signed   By: Lupita Raider, M.D.   On: 02/12/2018 10:21       ASSESSMENT: PRE-OP RESP EXAM  [place] Postperative Pulmonary Risk Score - for prolonged mech vent Comment Score  Type of surgery - abd ao aneurysm (27), thoracic (21), neurosurgery / upper abdominal / vascular (21), neck (11) thoracic 21  Emergency Surgery - (11) Yes if done this admit 11  ALbumin < 3 or poor nutritional state - (9) 2.9 on 2/24 9  BUN > 30 -  (8) 13 on 2/28 0  Partial or completely dependent functional status - (7) functional 0  COPD -  (6) Severe copd 6  Age - 71 to 63 (4), > 70  (6) Age 25 6  TOTAL  53  Risk Stratifcation scores  - < 10, 11-19, 20-27, 28-40, >40  Very High risk  - risk increased due to copd, and low albumin     CANET Postperative Pulmonary Risk Score -f or any resp complication Comment Score  Age - <50 (0), 50-80  (3), >80 (16) Age 60 3  Preoperative pulse ox - >96 (0), 91-95 (8), <90 (24) Pulse oxx 88% RA 24  Respiratory infection in last month - Yes (17) Flu A 02/03/18 17  Preoperative anemia - < 10gm% - Yes (  11) 13.5 11  Surgical incision - Upper abdominal (15), Thoracic (24) thoracic 24  Duration of surgery - <2h (0), 2-3h (16), >3h (23) >3h 23  Emergency Surgery - Yes (8) Yes if done this admit 8  TOTAL  110  Risk Stratification - Low (<26), Intermediate (26-44), High (>45)  High     He is very high risk If CABG/TAVR done in emergency setting in setting of low albumin, Flu A on 02/03/18 and ongoing hypoxemia especially in setting of severe COPD fev1 < 1L. This means high odds for tracheostomy and prolonged ICU care after CABGa nd also any pulmnary complication    If we can optimize his albumin, and hypoxemia and give more time (few to several weeks from flu A date of 02/03/18 and do surgery more electively  then risk can be lowered. But still his underlyig COPD - low fev1 will weigh heavily on outcome especially if not improved  I hae d/w Dr Eldridge Dace and the patient this.     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 Kalman Shan, M.D., [facility].C.P Pulmonary and Critical Care Medicine Staff Physician, [facility] Director - Interstitial Lung Disease  Program  Pulmonary Fibrosis [facility], [place], [address]  Pager: [phone], If no answer or between  15:00h - 7:00h: call [phone] Telephone: [phone]

## 2018-02-13 NOTE — Progress Notes (Signed)
ANTICOAGULATION CONSULT NOTE - Follow Up Consult  Pharmacy Consult for Heparin Indication: chest pain/ACS, CAD, awaiting CABG + AVR  No Known Allergies  Patient Measurements: Height: 5\' 6"  (167.6 cm) Weight: 163 lb 8 oz (74.2 kg) IBW/kg (Calculated) : 63.8 Heparin Dosing Weight: 75 kg  Vital Signs: Temp: 98.6 F (37 C) (02/28 0511) Temp Source: Oral (02/28 0511) BP: 124/61 (02/28 0825) Pulse Rate: 104 (02/28 0825)  Labs: Recent Labs    02/11/18 0312 02/12/18 0321 02/12/18 0934 02/13/18 0404  HGB 14.2 13.7  --  13.5  HCT 41.6 38.7*  --  39.2  PLT 238 293  --  254  HEPARINUNFRC 0.33 0.40  --  0.43  CREATININE  --   --  0.79 0.81    Estimated Creatinine Clearance: 73.3 mL/min (by C-G formula based on SCr of 0.81 mg/dL).  Assessment:  71 yoM consulted for heparin for ACS. Transfer from [facility] where he presented with pulmonary edema and NSTEMI. Planning CABG/AVR when able, timing uncertain, but possibly in a few to several weeks after flu 02/03/18.    Heparin level remains therapeutic (0.43) on 1600 units/hr.   Goal of Therapy:  Heparin level 0.3-0.7 units/ml Monitor platelets by anticoagulation protocol: Yes   Plan:   Continue heparin drip at 1600 units/hr  Daily heparin level and CBC while on heparin.  Follow up plans;  OHS date pending.  Tamiflu discontinued on 2/26 as course was complete.  Day # 5 of 5 Azithromycin.  Last dose scheduled dose given this am. Informed Dr. Marchelle Gearing.  Will follow up respiratory panel and Legionella and Strep antigens.  Dennie Fetters, RPh Pager: [phone] 02/13/2018,11:46 AM

## 2018-02-14 ENCOUNTER — Encounter (HOSPITAL_COMMUNITY)
Admission: AD | Disposition: A | Discharge status: Home Health | Payer: Self-pay | Source: Other Acute Inpatient Hospital | Attending: Cardiovascular Disease

## 2018-02-14 DIAGNOSIS — I2510 Atherosclerotic heart disease of native coronary artery without angina pectoris: Secondary | ICD-10-CM

## 2018-02-14 DIAGNOSIS — I350 Nonrheumatic aortic (valve) stenosis: Secondary | ICD-10-CM

## 2018-02-14 LAB — CBC
HCT: 39.2 % (ref 39.0–52.0)
Hemoglobin: 13.6 g/dL (ref 13.0–17.0)
MCH: 31 pg (ref 26.0–34.0)
MCHC: 34.7 g/dL (ref 30.0–36.0)
MCV: 89.3 fL (ref 78.0–100.0)
Platelets: 289 10*3/uL (ref 150–400)
RBC: 4.39 MIL/uL (ref 4.22–5.81)
RDW: 13 % (ref 11.5–15.5)
WBC: 15.2 10*3/uL — ABNORMAL HIGH (ref 4.0–10.5)

## 2018-02-14 LAB — HEPATIC FUNCTION PANEL
ALT: 43 U/L (ref 17–63)
AST: 30 U/L (ref 15–41)
Albumin: 2.8 g/dL — ABNORMAL LOW (ref 3.5–5.0)
Alkaline Phosphatase: 48 U/L (ref 38–126)
Bilirubin, Direct: 0.2 mg/dL (ref 0.1–0.5)
Indirect Bilirubin: 0.8 mg/dL (ref 0.3–0.9)
Total Bilirubin: 1 mg/dL (ref 0.3–1.2)
Total Protein: 6.4 g/dL — ABNORMAL LOW (ref 6.5–8.1)

## 2018-02-14 LAB — PROCALCITONIN: Procalcitonin: <0.1 ng/mL

## 2018-02-14 LAB — GLUCOSE, CAPILLARY
Glucose-Capillary: 203 mg/dL — ABNORMAL HIGH (ref 65–99)
Glucose-Capillary: 161 mg/dL — ABNORMAL HIGH (ref 65–99)
Glucose-Capillary: 150 mg/dL — ABNORMAL HIGH (ref 65–99)
Glucose-Capillary: 298 mg/dL — ABNORMAL HIGH (ref 65–99)

## 2018-02-14 LAB — MAGNESIUM: Magnesium: 1.7 mg/dL (ref 1.7–2.4)

## 2018-02-14 LAB — HEPARIN LEVEL (UNFRACTIONATED): Heparin Unfractionated: 0.37 IU/mL (ref 0.30–0.70)

## 2018-02-14 LAB — PHOSPHORUS: Phosphorus: 3.2 mg/dL (ref 2.5–4.6)

## 2018-02-14 SURGERY — CORONARY ARTERY BYPASS GRAFTING (CABG)
Anesthesia: General | Site: Chest

## 2018-02-14 MED ORDER — IPRATROPIUM-ALBUTEROL 0.5-2.5 (3) MG/3ML IN SOLN
3.0000 mL | Freq: Four times a day (QID) | RESPIRATORY_TRACT | Status: DC
  Administered 2018-02-14: 3 mL via RESPIRATORY_TRACT
  Filled 2018-02-14: qty 3

## 2018-02-14 MED ORDER — PREDNISONE 20 MG PO TABS
50.0000 mg | ORAL_TABLET | Freq: Every day | ORAL | Status: DC
  Administered 2018-02-14 – 2018-02-17 (×4): 50 mg via ORAL
  Filled 2018-02-14 (×4): qty 1

## 2018-02-14 NOTE — Progress Notes (Signed)
CARDIAC REHAB PHASE I   PRE:  Rate/Rhythm: 105 ST  BP:  Supine:   Sitting: 121/55  Standing:    SaO2: 89 1L  MODE:  Ambulation: 780 ft   POST:  Rate/Rhythm: 111 ST  BP:  Supine:   Sitting: 133/55  Standing:    SaO2: 88-91 RA [id number] Assisted X 1 and used O2 1L to ambulate. Gait steady walking. He took a slow steady pace without c/o of pain of SOB. O2 saturation during walk 88-91%. Pt back to chair after walk with call light in reach.  Melina Copa RN 02/14/2018 9:51 AM

## 2018-02-14 NOTE — Progress Notes (Signed)
      [address]       [name] [address]             [phone]     CARDIOTHORACIC SURGERY PROGRESS NOTE  Subjective: Patient reports continued improvement.  Cough improving.  Was able to ambulate yesterday without oxygen.  Objective: Vital signs in last 24 hours: Temp:  [98.7 F (37.1 C)-99 F (37.2 C)] 98.7 F (37.1 C) (03/01 0422) Pulse Rate:  [93-110] 105 (03/01 0946) Cardiac Rhythm: Normal sinus rhythm (03/01 0700) Resp:  [19] 19 (02/28 1950) BP: (105-143)/(53-81) 135/55 (03/01 0946) SpO2:  [90 %-94 %] 93 % (03/01 0850) Weight:  [164 lb 6.4 oz (74.6 kg)] 164 lb 6.4 oz (74.6 kg) (03/01 0422)  Physical Exam:  Rhythm:   Sinus  Breath sounds: Scattered rhonchi but improved  Heart sounds:  Regular rate and rhythm with systolic murmur  Incisions:  n/a  Abdomen:  Soft nontender  Extremities:  Warm and well perfused   Intake/Output from previous day: 02/28 0701 - 03/01 0700 In: 808 [P.O.:680; I.V.:128] Out: 1050 [Urine:1050] Intake/Output this shift: Total I/O In: 360 [P.O.:360] Out: 300 [Urine:300]  Lab Results: Recent Labs    02/13/18 0404 02/14/18 0336  WBC 14.4* 15.2*  HGB 13.5 13.6  HCT 39.2 39.2  PLT 254 289   BMET:  Recent Labs    02/12/18 0934 02/13/18 0404  NA 136 138  K 3.6 4.2  CL 100* 99*  CO2 28 29  GLUCOSE 247* 181*  BUN 17 13  CREATININE 0.79 0.81  CALCIUM 8.7* 8.9    CBG (last 3)  Recent Labs    02/13/18 1625 02/13/18 2115 02/14/18 0743  GLUCAP 162* 191* 203*   PT/INR:  No results for input(s): LABPROT, INR in the last 72 hours.  CXR:  N/A  Assessment/Plan:  Overall patient continues to slowly improve.  I's and O's have been balanced or slightly negative and the patient is approximately 4 pounds lighter than he was at the time of admission.  White blood count remains somewhat elevated.  Respiratory status seems to be improving.  Input from pulmonary medicine team helpful and greatly appreciated.   Discuss options with the patient and Dr. Eldridge Dace regarding timing of high risk surgery.  Patient expresses discomfort with thoughts of going home between now and the time of surgery.  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  Given the fact that he seems to be making progress it might be reasonable to proceed with surgery sometime next week if he continues to improve.  Will reevaluate on Monday and tentatively plan for surgery on Wednesday, February 19, 2018.   I spent in excess of 15 minutes during the conduct of this hospital encounter and >50% of this time involved direct face-to-face encounter with the patient for counseling and/or coordination of their care.   Purcell Nails, MD 02/14/2018 10:39 AM

## 2018-02-14 NOTE — Progress Notes (Signed)
ANTICOAGULATION CONSULT NOTE - Follow Up Consult  Pharmacy Consult for Heparin Indication: chest pain/ACS, CAD, awaiting CABG + AVR  No Known Allergies  Patient Measurements: Height: 5\' 6"  (167.6 cm) Weight: 164 lb 6.4 oz (74.6 kg) IBW/kg (Calculated) : 63.8 Heparin Dosing Weight: 75 kg  Vital Signs: Temp: 98.7 F (37.1 C) (03/01 0422) Temp Source: Oral (03/01 0422) BP: 135/55 (03/01 0946) Pulse Rate: 105 (03/01 0946)  Labs: Recent Labs    02/12/18 0321 02/12/18 0934 02/13/18 0404 02/14/18 0336  HGB 13.7  --  13.5 13.6  HCT 38.7*  --  39.2 39.2  PLT 293  --  254 289  HEPARINUNFRC 0.40  --  0.43 0.37  CREATININE  --  0.79 0.81  --     Estimated Creatinine Clearance: 73.3 mL/min (by C-G formula based on SCr of 0.81 mg/dL).  Assessment:  71 yoM consulted for heparin for ACS. Transfer from [facility] where he presented with pulmonary edema and NSTEMI. Planning CABG/AVR when able, timing uncertain, but possibly next week.    Heparin level remains therapeutic (0.37) on 1600 units/hr. CBC stable.  Goal of Therapy:  Heparin level 0.3-0.7 units/ml Monitor platelets by anticoagulation protocol: Yes   Plan:   Continue heparin drip at 1600 units/hr  Daily heparin level and CBC while on heparin.  Follow up plans;  OHS date pending.  Tamiflu discontinued on 2/26 as course was complete.  5-day course of Azithromcyin completed 2/28. Dr. Marchelle Gearing aware.  Respiratory panel and Strep antigen negative; Legionalla antigen in process.  Dennie Fetters, RPh Pager: [phone] 02/14/2018,11:59 AM

## 2018-02-14 NOTE — Progress Notes (Addendum)
Name: Harold Parker MRN: [id number] DOB: March 21, 1946    ADMISSION DATE:  02/07/2018 CONSULTATION DATE: 02/11/2018  REFERRING MD : Barry Dienes  CHIEF COMPLAINT: Shortness of breath  BRIEF PATIENT DESCRIPTION: 72 year old male who is on oxygen and remains hypoxic.  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 He was in his usual state of health until he noticed fever and chills and general malaise.  He went to his primary care physician and was treated with steroids and antimicrobial therapy.  Subsequently had a flu panel drawn on 02/03/2018 was a flu a positive.  He was started on Tamiflu but did not feel a subscription.  Continues to decline and went to the emergency room with increasing shortness of breath general malaise and failure to thrive.  On evaluation in the emergency department he was found to be positive troponins 2D echo revealed lowered EF of 40% and valvular dysfunction.  He has a history of coronary artery disease and had a stent in 1998.  He was transferred to [facility] for cardiac evaluation and cardiac cath.  Cardiac catheter revealed extensive three-vessel disease.  This was not amenable to stenting and he was arranged for cardiovascular thoracic surgeon to evaluate.  He has continued to be hypoxic with a PO2 in the 50s despite being on oxygen.  Continues to have some fine chills.  He has been treated with Tamiflu.  EVENTs 2/18 0 flu A positive 02/07/2018 - admit  SUBJECTIVE/OVERNIGHT/INTERVAL HX He feels as though he is slowly getting better, but still working pretty hard to breathe.   VITAL SIGNS: Temp:  [98.7 F (37.1 C)-99.1 F (37.3 C)] 99.1 F (37.3 C) (03/01 1321) Pulse Rate:  [93-110] 93 (03/01 1321) Resp:  [19] 19 (02/28 1950) BP: (106-143)/(45-81)  106/45 (03/01 1321) SpO2:  [93 %-94 %] 94 % (03/01 1407) Weight:  [74.6 kg (164 lb 6.4 oz)] 74.6 kg (164 lb 6.4 oz) (03/01 0422)  PHYSICAL EXAMINATION:  General:  Elderly appearing male in mild resp distress.  Neuro:  Alert, oriented, non-focal HEENT:  [place]/AT, No JVD noted, PERRL Cardiovascular:  RRR, no MRG Lungs:  Coarse wheeze throughout.  Abdomen:  Soft, non-distended, non-tender Musculoskeletal:  No acute deformity or ROM limitation Skin:  Intact, MMM   PULMONARY Recent Labs  Lab 02/10/18 0320 02/10/18 0730  PHART 7.462* 7.464*  PCO2ART 44.8 37.2  PO2ART 49.4* 51.4*  HCO3 31.6* 26.5  O2SAT 85.5 89.1    CBC Recent Labs  Lab 02/12/18 0321 02/13/18 0404 02/14/18 0336  HGB 13.7 13.5 13.6  HCT 38.7* 39.2 39.2  WBC 14.8* 14.4* 15.2*  PLT 293 254 289    COAGULATION No results for input(s): INR in the last 168 hours.  CARDIAC  No results for input(s): TROPONINI in the last 168 hours. No results for input(s): PROBNP in the last 168 hours.   CHEMISTRY Recent Labs  Lab 02/08/18 0319 02/09/18 0327 02/12/18 0934 02/13/18 0404 02/14/18 0336  NA 137 137 136 138  --   K 3.2* 3.7 3.6 4.2  --   CL 96* 97* 100* 99*  --   CO2 29 29 28 29   --   GLUCOSE 150* 154* 247* 181*  --   BUN 24* 26* 17 13  --   CREATININE 0.75 0.90  0.79 0.81  --   CALCIUM 8.6* 8.5* 8.7* 8.9  --   MG  --   --   --   --  1.7  PHOS  --   --   --   --  3.2   Estimated Creatinine Clearance: 73.3 mL/min (by C-G formula based on SCr of 0.81 mg/dL).  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   CAD  - for CABG and AVR 3/6. Will be available in post op setting as needed.   Joneen Roach, AGACNP-BC [facility] Pager [phone] or [phone]  02/14/2018 2:38 PM  Attending Note:  72 year old male with severe COPD who presents with CAD who presents to PCCM for surgical clearance for a CABG.  On exam, continues to wheeze.  I reviewed CXR Myself, hyperinflation noted.  Discussed with PCCM-NP.  COPD with exacerbation:  - Steroids as ordered  - Change inhalers to nebs  Flu A:  - PCT negative  - Check urine strep and RVP PCR  - Finish zithromax 5 day courese  Hypoxemia:  - Titrate O2 for sat of 88-92%  CAD:  - Would like to get wheezing and mucous production under control prior to surgery, hopefully the course steroids will help wheezing and respiratory condition.  PCCM will follow again on Monday.  Patient seen and examined, agree with above note.  I dictated the care and orders written for this patient under my direction.  Alyson Reedy, M.D. [facility]monary/Critical Care Medicine. Pager: [phone]. After hours pager: [phone].

## 2018-02-14 NOTE — Progress Notes (Signed)
Progress Note  Patient Name: Harold Parker Date of Encounter: 02/14/2018  Primary Cardiologist: No primary care provider on file.   Subjective   No chest pain.  He would like to stay in the hospital and get his surgery done.  He understands that there are risks if he does not wait a few weeks, but he would feel better not taking the risk of going home before surgery.  He has discussed this with his family.  Inpatient Medications    Scheduled Meds: . amLODipine  5 mg Oral Daily  . aspirin EC  81 mg Oral Daily  . atorvastatin  80 mg Oral q1800  . ezetimibe  10 mg Oral Daily  . furosemide  20 mg Intravenous BID  . insulin aspart  0-15 Units Subcutaneous TID WC  . ipratropium-albuterol  3 mL Nebulization Q6H  . isosorbide mononitrate  60 mg Oral Daily  . losartan  100 mg Oral Daily  . metoprolol tartrate  25 mg Oral BID  . nicotine  14 mg Transdermal Daily  . potassium chloride  20 mEq Oral BID  . predniSONE  50 mg Oral Q breakfast  . sodium chloride flush  3 mL Intravenous Q12H   Continuous Infusions: . sodium chloride    . heparin 1,600 Units/hr (02/14/18 0513)   PRN Meds: sodium chloride, acetaminophen, ALPRAZolam, guaiFENesin-dextromethorphan, nitroGLYCERIN, ondansetron (ZOFRAN) IV, sodium chloride flush   Vital Signs    Vitals:   02/14/18 0850 02/14/18 0946 02/14/18 1321 02/14/18 1407  BP:  (!) 135/55 (!) 106/45   Pulse:  (!) 105 93   Resp:      Temp:   99.1 F (37.3 C)   TempSrc:   Oral   SpO2: 93%   94%  Weight:      Height:        Intake/Output Summary (Last 24 hours) at 02/14/2018 1544 Last data filed at 02/14/2018 1323 Gross per 24 hour  Intake 1060 ml  Output 1200 ml  Net -140 ml   Filed Weights   02/12/18 0834 02/13/18 0511 02/14/18 0422  Weight: 165 lb 4.8 oz (75 kg) 163 lb 8 oz (74.2 kg) 164 lb 6.4 oz (74.6 kg)    Telemetry    NSR - Personally Reviewed  ECG      Physical Exam   GEN: No acute distress.   Neck: No JVD Cardiac: RRR, no  murmurs, rubs, or gallops.  Respiratory: Clear to auscultation bilaterally. GI: Soft, nontender, non-distended  MS: No edema; No deformity. Neuro:  Nonfocal  Psych: Normal affect   Labs    Chemistry Recent Labs  Lab 02/09/18 0327 02/12/18 0934 02/13/18 0404 02/14/18 0336  NA 137 136 138  --   K 3.7 3.6 4.2  --   CL 97* 100* 99*  --   CO2 29 28 29   --   GLUCOSE 154* 247* 181*  --   BUN 26* 17 13  --   CREATININE 0.90 0.79 0.81  --   CALCIUM 8.5* 8.7* 8.9  --   PROT 5.8*  --   --  6.4*  ALBUMIN 2.9*  --   --  2.8*  AST 25  --   --  30  ALT 43  --   --  43  ALKPHOS 48  --   --  48  BILITOT 1.0  --   --  1.0  GFRNONAA >60 >60 >60  --   GFRAA >60 >60 >60  --   ANIONGAP  11 8 10   --      Hematology Recent Labs  Lab 02/12/18 0321 02/13/18 0404 02/14/18 0336  WBC 14.8* 14.4* 15.2*  RBC 4.29 4.37 4.39  HGB 13.7 13.5 13.6  HCT 38.7* 39.2 39.2  MCV 90.2 89.7 89.3  MCH 31.9 30.9 31.0  MCHC 35.4 34.4 34.7  RDW 13.6 13.1 13.0  PLT 293 254 289    Cardiac EnzymesNo results for input(s): TROPONINI in the last 168 hours. No results for input(s): TROPIPOC in the last 168 hours.   BNPNo results for input(s): BNP, PROBNP in the last 168 hours.   DDimer No results for input(s): DDIMER in the last 168 hours.   Radiology    No results found.  Cardiac Studies   Cath results showed multivessel disease  Patient Profile     72 y.o. male CAD  Assessment & Plan    CAD: d/w Dr. Cornelius Parker.  Tentatively Plan for CABG/AVR next Wednesday. COntinue pulmonary optimization.  Patient would prefer to have surgery in-house as noted above.  Pulmonary following up on some atypical pneumonia tests to narrow antibiotics.  Prednisone as well.   Hyperlipidemia: atorvastatin and Zetia.   For questions or updates, please contact [facility] Please consult [email or web address] for contact info under Cardiology/STEMI.      Signed, Lance Muss, MD  02/14/2018, 3:44 PM

## 2018-02-15 LAB — LEGIONELLA PNEUMOPHILA SEROGP 1 UR AG: L. pneumophila Serogp 1 Ur Ag: NEGATIVE

## 2018-02-15 LAB — CBC
HCT: 41.3 % (ref 39.0–52.0)
Hemoglobin: 14 g/dL (ref 13.0–17.0)
MCH: 30.6 pg (ref 26.0–34.0)
MCHC: 33.9 g/dL (ref 30.0–36.0)
MCV: 90.4 fL (ref 78.0–100.0)
Platelets: 323 10*3/uL (ref 150–400)
RBC: 4.57 MIL/uL (ref 4.22–5.81)
RDW: 13 % (ref 11.5–15.5)
WBC: 15.3 10*3/uL — ABNORMAL HIGH (ref 4.0–10.5)

## 2018-02-15 LAB — GLUCOSE, CAPILLARY
Glucose-Capillary: 233 mg/dL — ABNORMAL HIGH (ref 65–99)
Glucose-Capillary: 163 mg/dL — ABNORMAL HIGH (ref 65–99)
Glucose-Capillary: 288 mg/dL — ABNORMAL HIGH (ref 65–99)
Glucose-Capillary: 340 mg/dL — ABNORMAL HIGH (ref 65–99)

## 2018-02-15 LAB — PHOSPHORUS: Phosphorus: 3.5 mg/dL (ref 2.5–4.6)

## 2018-02-15 LAB — MAGNESIUM: Magnesium: 2 mg/dL (ref 1.7–2.4)

## 2018-02-15 LAB — HEPARIN LEVEL (UNFRACTIONATED): Heparin Unfractionated: 0.56 IU/mL (ref 0.30–0.70)

## 2018-02-15 MED ORDER — IPRATROPIUM-ALBUTEROL 0.5-2.5 (3) MG/3ML IN SOLN
3.0000 mL | Freq: Three times a day (TID) | RESPIRATORY_TRACT | Status: DC
  Administered 2018-02-15 – 2018-02-17 (×7): 3 mL via RESPIRATORY_TRACT
  Filled 2018-02-15 (×7): qty 3

## 2018-02-15 NOTE — Progress Notes (Signed)
CARDIAC REHAB PHASE I   PRE:  Rate/Rhythm: 100 st  BP:  Sitting: 125/58      SaO2: 100 2 liters  MODE:  Ambulation: 790 ft   POST:  Rate/Rhythm: 104 st  BP:  Sitting: 144/66     SaO2: 95 2 liters  0935-1000 Patient ambulated in hallway independently. Steady gait noted. CR RN mobilized IV with O2. Patient denied complaints. Post ambulation patient back to sitting in chair. Encouraged to ambulate as tolerated with primary RN permission and set up. Will follow up on Monday.  Kaeden Mester English PayneRN, BSN 02/15/2018 10:02 AM

## 2018-02-15 NOTE — Progress Notes (Signed)
ANTICOAGULATION CONSULT NOTE - Follow Up Consult  Pharmacy Consult for heparin Indication: NSTEM, awaiting CABG/AVR  No Known Allergies  Patient Measurements: Height: 5\' 6"  (167.6 cm) Weight: 162 lb 8 oz (73.7 kg) IBW/kg (Calculated) : 63.8 Heparin Dosing Weight: 75 kg  Vital Signs: Temp: 98.5 F (36.9 C) (03/02 0430) Temp Source: Oral (03/02 0430) BP: 144/66 (03/02 1000) Pulse Rate: 101 (03/02 1000)  Labs: Recent Labs    02/13/18 0404 02/14/18 0336 02/15/18 0238  HGB 13.5 13.6 14.0  HCT 39.2 39.2 41.3  PLT 254 289 323  HEPARINUNFRC 0.43 0.37 0.56  CREATININE 0.81  --   --     Estimated Creatinine Clearance: 73.3 mL/min (by C-G formula based on SCr of 0.81 mg/dL).   Medications:  Scheduled:  . amLODipine  5 mg Oral Daily  . aspirin EC  81 mg Oral Daily  . atorvastatin  80 mg Oral q1800  . ezetimibe  10 mg Oral Daily  . furosemide  20 mg Intravenous BID  . insulin aspart  0-15 Units Subcutaneous TID WC  . ipratropium-albuterol  3 mL Nebulization TID  . isosorbide mononitrate  60 mg Oral Daily  . losartan  100 mg Oral Daily  . metoprolol tartrate  25 mg Oral BID  . nicotine  14 mg Transdermal Daily  . potassium chloride  20 mEq Oral BID  . predniSONE  50 mg Oral Q breakfast  . sodium chloride flush  3 mL Intravenous Q12H   Infusions:  . sodium chloride    . heparin 1,600 Units/hr (02/14/18 2219)    Assessment: 71 YOM on heparin for NSTEMI, planning CABG/AVR next week (tentatively Wed 3/6). Patient recently completed treatment for COPD exacerbation/influenza. Heparin level this morning was 0.56, at goal. CBC is wnl and stable, no bleeding noted.  Goal of Therapy:  Heparin level 0.3-0.7 units/ml Monitor platelets by anticoagulation protocol: Yes   Plan:  Continue heparin 1600 units/hr Daily heparin level and CBC, monitor for s/sx of bleeding Follow cardiology plans for CABG/AVR (tentatively Wed 3/6)   Al Corpus, PharmD PGY1 Pharmacy  Resident Phone: [phone] After 3:30PM please call Main Pharmacy [phone] 02/15/2018,11:26 AM

## 2018-02-15 NOTE — Progress Notes (Signed)
Subjective:  Says he feels better.  No complaints of shortness of breath or chest pain.  Still coughing some.  Objective:  Vital Signs in the last 24 hours: BP (!) 144/66   Pulse (!) 101   Temp 98.5 F (36.9 C) (Oral)   Resp 18   Ht 5\' 6"  (1.676 m)   Wt 73.7 kg (162 lb 8 oz)   SpO2 94%   BMI 26.23 kg/m   Physical Exam: Pleasant male currently in no acute distress Lungs:  Clear Cardiac:  Regular rhythm, normal S1 and S2, no S3, 2/6 systolic murmur at aortic valve Abdomen:  Soft, nontender, no masses Extremities:  No edema present  Intake/Output from previous day: 03/01 0701 - 03/02 0700 In: 1080 [P.O.:1080] Out: 650 [Urine:650]  Weight Filed Weights   02/13/18 0511 02/14/18 0422 02/15/18 0430  Weight: 74.2 kg (163 lb 8 oz) 74.6 kg (164 lb 6.4 oz) 73.7 kg (162 lb 8 oz)    Lab Results: Basic Metabolic Panel: Recent Labs    02/13/18 0404  NA 138  K 4.2  CL 99*  CO2 29  GLUCOSE 181*  BUN 13  CREATININE 0.81   CBC: Recent Labs    02/14/18 0336 02/15/18 0238  WBC 15.2* 15.3*  HGB 13.6 14.0  HCT 39.2 41.3  MCV 89.3 90.4  PLT 289 323   Telemetry: Sinus rhythm, personally reviewed  Assessment/Plan:  1.  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  Recent flu and COPD exacerbation 3.  Hyperlipidemia under treatment  Recommendations:  Questions answered about care.  He is clinically getting better and feels better.  He was ambulatory in the hall.  Remains on IV heparin and awaiting surgery on Wednesday.      Harold Palmer  MD [facility]  02/15/2018, 10:36 AM

## 2018-02-16 LAB — MAGNESIUM: Magnesium: 2 mg/dL (ref 1.7–2.4)

## 2018-02-16 LAB — HEPARIN LEVEL (UNFRACTIONATED): Heparin Unfractionated: 0.63 IU/mL (ref 0.30–0.70)

## 2018-02-16 LAB — GLUCOSE, CAPILLARY
Glucose-Capillary: 193 mg/dL — ABNORMAL HIGH (ref 65–99)
Glucose-Capillary: 164 mg/dL — ABNORMAL HIGH (ref 65–99)
Glucose-Capillary: 261 mg/dL — ABNORMAL HIGH (ref 65–99)
Glucose-Capillary: 244 mg/dL — ABNORMAL HIGH (ref 65–99)

## 2018-02-16 LAB — CBC
HCT: 39.4 % (ref 39.0–52.0)
Hemoglobin: 13.6 g/dL (ref 13.0–17.0)
MCH: 31.3 pg (ref 26.0–34.0)
MCHC: 34.5 g/dL (ref 30.0–36.0)
MCV: 90.8 fL (ref 78.0–100.0)
Platelets: 319 10*3/uL (ref 150–400)
RBC: 4.34 MIL/uL (ref 4.22–5.81)
RDW: 13.1 % (ref 11.5–15.5)
WBC: 17 10*3/uL — ABNORMAL HIGH (ref 4.0–10.5)

## 2018-02-16 LAB — PHOSPHORUS: Phosphorus: 3.8 mg/dL (ref 2.5–4.6)

## 2018-02-16 NOTE — Progress Notes (Signed)
Subjective:   No complaints of shortness of breath or chest pain. Able to ambulate in hall.   Objective:  Vital Signs in the last 24 hours: BP (!) 141/66 (BP Location: Right Arm)   Pulse 85   Temp (!) 97.4 F (36.3 C) (Oral)   Resp (!) 21   Ht 5\' 6"  (1.676 m)   Wt 74.3 kg (163 lb 14.4 oz)   SpO2 95%   BMI 26.45 kg/m   Physical Exam: Pleasant male currently in no acute distress Lungs:  Clear Cardiac:  Regular rhythm, normal S1 and S2, no S3, 2/6 systolic murmur at aortic valve Extremities:  No edema present  Intake/Output from previous day: 03/02 0701 - 03/03 0700 In: 240 [P.O.:240] Out: -   Weight Filed Weights   02/14/18 0422 02/15/18 0430 02/16/18 0455  Weight: 74.6 kg (164 lb 6.4 oz) 73.7 kg (162 lb 8 oz) 74.3 kg (163 lb 14.4 oz)    Lab Results: CBC: Recent Labs    02/14/18 0336 02/15/18 0238  WBC 15.2* 15.3*  HGB 13.6 14.0  HCT 39.2 41.3  MCV 89.3 90.4  PLT 289 323   Telemetry: Sinus rhythm, personally reviewed  Assessment/Plan:  1.  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  Recent flu and COPD exacerbation improving 3.  Hyperlipidemia under treatment  Recommendations:   He is clinically getting better and feels better.  Remains on IV heparin and awaiting surgery on Wednesday.      Harold Palmer  MD [facility]  02/16/2018, 8:27 AM

## 2018-02-16 NOTE — Progress Notes (Signed)
ANTICOAGULATION CONSULT NOTE - Follow Up Consult  Pharmacy Consult for heparin Indication: NSTEM, awaiting CABG/AVR  No Known Allergies  Patient Measurements: Height: 5\' 6"  (167.6 cm) Weight: 163 lb 14.4 oz (74.3 kg) IBW/kg (Calculated) : 63.8 Heparin Dosing Weight: 75 kg  Vital Signs: Temp: 97.4 F (36.3 C) (03/03 0455) Temp Source: Oral (03/03 0455) BP: 110/53 (03/03 0931) Pulse Rate: 88 (03/03 0932)  Labs: Recent Labs    02/14/18 0336 02/15/18 0238 02/16/18 0850  HGB 13.6 14.0 13.6  HCT 39.2 41.3 39.4  PLT 289 323 319  HEPARINUNFRC 0.37 0.56 0.63    Estimated Creatinine Clearance: 73.3 mL/min (by C-G formula based on SCr of 0.81 mg/dL).   Medications:  Scheduled:  . amLODipine  5 mg Oral Daily  . aspirin EC  81 mg Oral Daily  . atorvastatin  80 mg Oral q1800  . ezetimibe  10 mg Oral Daily  . furosemide  20 mg Intravenous BID  . insulin aspart  0-15 Units Subcutaneous TID WC  . ipratropium-albuterol  3 mL Nebulization TID  . isosorbide mononitrate  60 mg Oral Daily  . losartan  100 mg Oral Daily  . metoprolol tartrate  25 mg Oral BID  . nicotine  14 mg Transdermal Daily  . potassium chloride  20 mEq Oral BID  . predniSONE  50 mg Oral Q breakfast  . sodium chloride flush  3 mL Intravenous Q12H   Infusions:  . sodium chloride    . heparin 1,600 Units/hr (02/16/18 0547)    Assessment: 71 YOM on heparin for NSTEMI, planning CABG/AVR next week (Wed 3/6). Patient recently completed treatment for COPD exacerbation/influenza.   Heparin level this morning was at goal 0.63, however has been trending up steadily for past 3 days. CBC is wnl and stable, no bleeding noted.  Goal of Therapy:  Heparin level 0.3-0.7 units/ml Monitor platelets by anticoagulation protocol: Yes   Plan:  Decrease heparin to 1550 units/hr Daily heparin level and CBC, monitor for s/sx of bleeding Follow cardiology plans for CABG/AVR (Wed 3/6)   Al Corpus, PharmD PGY1  Pharmacy Resident Phone: [phone] After 3:30PM please call Main Pharmacy [phone] 02/16/2018,10:03 AM

## 2018-02-17 DIAGNOSIS — I350 Nonrheumatic aortic (valve) stenosis: Secondary | ICD-10-CM

## 2018-02-17 DIAGNOSIS — I2510 Atherosclerotic heart disease of native coronary artery without angina pectoris: Secondary | ICD-10-CM

## 2018-02-17 LAB — CBC
HCT: 37.2 % — ABNORMAL LOW (ref 39.0–52.0)
Hemoglobin: 12.5 g/dL — ABNORMAL LOW (ref 13.0–17.0)
MCH: 30.9 pg (ref 26.0–34.0)
MCHC: 33.6 g/dL (ref 30.0–36.0)
MCV: 91.9 fL (ref 78.0–100.0)
Platelets: 301 10*3/uL (ref 150–400)
RBC: 4.05 MIL/uL — ABNORMAL LOW (ref 4.22–5.81)
RDW: 13.5 % (ref 11.5–15.5)
WBC: 12.8 10*3/uL — ABNORMAL HIGH (ref 4.0–10.5)

## 2018-02-17 LAB — GLUCOSE, CAPILLARY
Glucose-Capillary: 198 mg/dL — ABNORMAL HIGH (ref 65–99)
Glucose-Capillary: 194 mg/dL — ABNORMAL HIGH (ref 65–99)
Glucose-Capillary: 440 mg/dL — ABNORMAL HIGH (ref 65–99)
Glucose-Capillary: 358 mg/dL — ABNORMAL HIGH (ref 65–99)

## 2018-02-17 LAB — HEPARIN LEVEL (UNFRACTIONATED): Heparin Unfractionated: 0.52 IU/mL (ref 0.30–0.70)

## 2018-02-17 LAB — MAGNESIUM: Magnesium: 2 mg/dL (ref 1.7–2.4)

## 2018-02-17 LAB — PHOSPHORUS: Phosphorus: 3.7 mg/dL (ref 2.5–4.6)

## 2018-02-17 MED ORDER — PREDNISONE 20 MG PO TABS
20.0000 mg | ORAL_TABLET | Freq: Once | ORAL | Status: AC
  Administered 2018-02-18: 20 mg via ORAL
  Filled 2018-02-17: qty 1

## 2018-02-17 MED ORDER — IPRATROPIUM-ALBUTEROL 0.5-2.5 (3) MG/3ML IN SOLN
3.0000 mL | RESPIRATORY_TRACT | Status: DC | PRN
  Administered 2018-02-17 – 2018-02-26 (×4): 3 mL via RESPIRATORY_TRACT
  Filled 2018-02-17 (×3): qty 3

## 2018-02-17 MED ORDER — PREDNISONE 10 MG PO TABS
10.0000 mg | ORAL_TABLET | Freq: Once | ORAL | Status: AC
  Administered 2018-02-19: 10 mg via ORAL
  Filled 2018-02-17: qty 1

## 2018-02-17 NOTE — Progress Notes (Addendum)
      [address]       [name] [address]             [phone]     CARDIOTHORACIC SURGERY PROGRESS NOTE  Subjective: Feels better.  Breathing continues to improve.  Objective: Vital signs in last 24 hours: Temp:  [98.1 F (36.7 C)-99.2 F (37.3 C)] 99 F (37.2 C) (03/04 1400) Pulse Rate:  [78-91] 84 (03/04 1400) Cardiac Rhythm: Normal sinus rhythm;Bundle branch block (03/04 0830) Resp:  [18-20] 20 (03/04 1400) BP: (118-130)/(52-74) 130/54 (03/04 1400) SpO2:  [92 %-96 %] 96 % (03/04 1400) Weight:  [164 lb 8 oz (74.6 kg)] 164 lb 8 oz (74.6 kg) (03/04 0345)  Physical Exam:  Rhythm:   sinus  Breath sounds: clear  Heart sounds:  RRR w/ systolic murmur  Incisions:  n/a  Abdomen:  soft  Extremities:  warm   Intake/Output from previous day: 03/03 0701 - 03/04 0700 In: 1680 [P.O.:1680] Out: 700 [Urine:700] Intake/Output this shift: Total I/O In: 720 [P.O.:720] Out: 800 [Urine:800]  Lab Results: Recent Labs    02/16/18 0850 02/17/18 0337  WBC 17.0* 12.8*  HGB 13.6 12.5*  HCT 39.4 37.2*  PLT 319 301   BMET: No results for input(s): NA, K, CL, CO2, GLUCOSE, BUN, CREATININE, CALCIUM in the last 72 hours.  CBG (last 3)  Recent Labs    02/17/18 0744 02/17/18 1150 02/17/18 1627  GLUCAP 198* 194* 440*   PT/INR:  No results for input(s): LABPROT, INR in the last 72 hours.  CXR:  N/A  Assessment/Plan: S/P Procedure(s) (LRB): CORONARY ARTERY BYPASS GRAFTING (CABG) (N/A) AORTIC VALVE REPLACEMENT (AVR) (N/A) TRANSESOPHAGEAL ECHOCARDIOGRAM (TEE) (N/A)  We tentatively plan high risk AVR + CABG on Wednesday 3/6.  All questions answered.   I spent in excess of 15 minutes during the conduct of this hospital encounter and >50% of this time involved direct face-to-face encounter with the patient for counseling and/or coordination of their care.   Purcell Nails, MD 02/17/2018 5:48 PM

## 2018-02-17 NOTE — Progress Notes (Signed)
ANTICOAGULATION CONSULT NOTE - Follow Up Consult  Pharmacy Consult for Heparin Indication: chest pain/ACS, CAD, awaiting CABG + AVR  No Known Allergies  Patient Measurements: Height: 5\' 6"  (167.6 cm) Weight: 164 lb 8 oz (74.6 kg) IBW/kg (Calculated) : 63.8 Heparin Dosing Weight: 75 kg  Vital Signs: Temp: 98.1 F (36.7 C) (03/04 0345) Temp Source: Oral (03/04 0345) BP: 118/52 (03/04 0345) Pulse Rate: 78 (03/04 0345)  Labs: Recent Labs    02/15/18 0238 02/16/18 0850 02/17/18 0337  HGB 14.0 13.6 12.5*  HCT 41.3 39.4 37.2*  PLT 323 319 301  HEPARINUNFRC 0.56 0.63 0.52    Estimated Creatinine Clearance: 73.3 mL/min (by C-G formula based on SCr of 0.81 mg/dL).  Assessment:  36 yoM consulted for heparin for ACS. Transfer from [facility] where he presented with pulmonary edema and NSTEMI. Recently completed Tamiflu (2/26) and Azithromycin (2/28) for + Influenza A and CAP/COPD exacerbation. CABG/AVR tentatively scheduled for 3/6.     Heparin level is therapeutic (0.52) on 1500 units/hr. CBC trending down some, no bleeding reported.  Goal of Therapy:  Heparin level 0.3-0.7 units/ml Monitor platelets by anticoagulation protocol: Yes   Plan:   Continue heparin drip at 1550 units/hr  Daily heparin level and CBC while on heparin.  Tentative plans for CABG and AVR on 3/6.  Noted plan for 2 more days of Prednisone.  Dennie Fetters, [place] Pager: [phone] 02/17/2018,12:34 PM

## 2018-02-17 NOTE — Progress Notes (Addendum)
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 He was in his usual state of health until he noticed fever and chills and general malaise.  He went to his primary care physician and was treated with steroids and antimicrobial therapy.  Subsequently had a flu panel drawn on 02/03/2018 was a flu a positive.  He was started on Tamiflu but did not feel a subscription. Continues to decline and went to the emergency room with increasing shortness of breath general malaise and failure to thrive.  On evaluation in the emergency department he was found to be positive troponins 2D echo revealed lowered EF of 40% and valvular dysfunction.  He has a history of coronary artery disease and had a stent in 1998.  He was transferred to [facility] for cardiac evaluation and cardiac cath.  Cardiac catheter revealed extensive three-vessel disease.  This was not amenable to stenting and he was arranged for cardiovascular thoracic surgeon to evaluate.  He has continued to be hypoxic with a PO2 in the 50s despite being on oxygen.  Continues to have some fine chills.  He has been treated with Tamiflu.  Course then complicated by dyspnea. PCCM called back 2/28  Events 2/18 0 flu A positive 02/07/2018 - admit 2/27 PFT > FVC 1.28 (45% with 15% change post BD), FEV1 0.93 (37% with 1% change post BD), Ratio 59 (80%) 2/28 Urine strep neg 2/28 urine legionella neg 2/28 RVP neg  SUBJECTIVE/OVERNIGHT/INTERVAL HX Significantly better compared to Friday when I last saw him. Has been up and walking around. Feels like he is getting his air.    VITAL SIGNS: Temp:  [98.1 F (36.7 C)-99.2 F (37.3 C)] 98.1 F (36.7 C) (03/04 0345) Pulse Rate:  [78-91] 78 (03/04 0345) Resp:  [18-20] 18 (03/04 0345) BP: (118-124)/(50-74) 118/52 (03/04 0345) SpO2:  [91 %-96 %] 96 % (03/04 0809) Weight:  [74.6 kg (164 lb 8 oz)] 74.6 kg (164 lb 8 oz) (03/04 0345)  PHYSICAL EXAMINATION:  General:  Elderly appearing male NAD seated at bedside.  Neuro:  Alert, oriented, non-focal. Upbeat and optimistic.  HEENT:  [place]/AT, No JVD noted, PERRL Cardiovascular:  RRR, no MRG Lungs:  Clear bilateral breath sounds posterior lung fields. Wheeze all but gone.  Abdomen:  Soft, non-distended, non-tender Musculoskeletal:  No acute deformity or ROM limitation Skin:  Intact, MMM   PULMONARY No results for input(s): PHART, PCO2ART, PO2ART, HCO3, TCO2, O2SAT in the last 168 hours.  Invalid input(s): PCO2, PO2  CBC Recent Labs  Lab 02/15/18 0238 02/16/18 0850 02/17/18 0337  HGB 14.0 13.6 12.5*  HCT 41.3 39.4 37.2*  WBC 15.3* 17.0* 12.8*  PLT 323 319 301    COAGULATION No results for input(s): INR in the last 168 hours.  CARDIAC  No results for input(s): TROPONINI in the last 168 hours. No results for input(s): PROBNP in the last 168 hours.   CHEMISTRY Recent Labs  Lab 02/12/18 4098 02/13/18 0404 02/14/18 0336 02/15/18 0238 02/16/18 0850 02/17/18 0337  NA 136 138  --   --   --   --  K 3.6 4.2  --   --   --   --   CL 100* 99*  --   --   --   --   CO2 28 29  --   --   --   --   GLUCOSE 247* 181*  --   --   --   --   BUN 17 13  --   --   --   --   CREATININE 0.79 0.81  --   --   --   --   CALCIUM 8.7* 8.9  --   --   --   --   MG  --   --  1.7 2.0 2.0 2.0  PHOS  --   --  3.2 3.5 3.8 3.7   Estimated Creatinine Clearance: 73.3 mL/min (by C-G formula based on SCr of 0.81 mg/dL).  LIVER Recent Labs  Lab 02/14/18 0336  AST 30  ALT 43  ALKPHOS 48  BILITOT 1.0  PROT 6.4*  ALBUMIN 2.8*   INFECTIOUS Recent Labs  Lab  02/12/18 1807 02/13/18 0404 02/14/18 0336  PROCALCITON <0.10 <0.10 <0.10   ENDOCRINE CBG (last 3)  Recent Labs    02/16/18 2106 02/17/18 0744 02/17/18 1150  GLUCAP 244* 198* 194*    ASSESSMENT/PLAN:  COPD with acute exacerbation: Improving, appears to have gold stage 3 at baseline. PFT 2/27 c/w severe obstruction. - Continue nebulized bronchodilators and systemic steroids.  - Plan for Prednisone 50mg  x 2 more days then stop as symptoms have significantly improved and to facilitate post-op healing.  - Supplemental O2 to keep pulse ox 88-95% - Continue nebulized nebs in post op setting. Will defer home inhalers pending recovery from CABG.   CAP (urine strep and legionella negative, RVP neg) Flu A - resolved - s/p course of tamiflu ended 2/26 and azithromycin ended 2/28  CAD  - for CABG and AVR 3/6. Will be available in post op setting as needed.   Joneen Roach, AGACNP-BC [facility] Pulmonology/Critical Care Pager [phone] or [phone]  02/17/2018 12:06 PM

## 2018-02-17 NOTE — Progress Notes (Addendum)
Progress Note  Patient Name: Harold Parker Date of Encounter: 02/17/2018  Primary Cardiologist: No primary care provider on file.  Subjective   Feeling well this morning. States the breathing is continuing to improve. Hopeful for surgery this week.   Inpatient Medications    Scheduled Meds: . amLODipine  5 mg Oral Daily  . aspirin EC  81 mg Oral Daily  . atorvastatin  80 mg Oral q1800  . ezetimibe  10 mg Oral Daily  . furosemide  20 mg Intravenous BID  . insulin aspart  0-15 Units Subcutaneous TID WC  . ipratropium-albuterol  3 mL Nebulization TID  . isosorbide mononitrate  60 mg Oral Daily  . losartan  100 mg Oral Daily  . metoprolol tartrate  25 mg Oral BID  . nicotine  14 mg Transdermal Daily  . potassium chloride  20 mEq Oral BID  . predniSONE  50 mg Oral Q breakfast  . sodium chloride flush  3 mL Intravenous Q12H   Continuous Infusions: . sodium chloride    . heparin 1,550 Units/hr (02/16/18 2152)   PRN Meds: sodium chloride, acetaminophen, ALPRAZolam, guaiFENesin-dextromethorphan, nitroGLYCERIN, ondansetron (ZOFRAN) IV, sodium chloride flush   Vital Signs    Vitals:   02/16/18 1411 02/16/18 1947 02/16/18 2020 02/17/18 0345  BP: (!) 119/50  124/74 (!) 118/52  Pulse: 87  91 78  Resp: 20  20 18   Temp: 98.9 F (37.2 C)  99.2 F (37.3 C) 98.1 F (36.7 C)  TempSrc: Oral  Oral Oral  SpO2: 91% 93% 96% 92%  Weight:    164 lb 8 oz (74.6 kg)  Height:        Intake/Output Summary (Last 24 hours) at 02/17/2018 0729 Last data filed at 02/17/2018 1610 Gross per 24 hour  Intake 1680 ml  Output 700 ml  Net 980 ml   Filed Weights   02/15/18 0430 02/16/18 0455 02/17/18 0345  Weight: 162 lb 8 oz (73.7 kg) 163 lb 14.4 oz (74.3 kg) 164 lb 8 oz (74.6 kg)    Telemetry    SR - Personally Reviewed  ECG    N/a  - Personally Reviewed  Physical Exam   General: Well developed, well nourished, male appearing in no acute distress. Head: Normocephalic,  atraumatic.  Neck: Supple without bruits, JVD. Lungs:  Resp regular and unlabored, CTA. Heart: RRR, S1, S2, no S3, S4, 2/6 systolic murmur; no rub. Abdomen: Soft, non-tender, non-distended with normoactive bowel sounds.  Extremities: No clubbing, cyanosis, edema. Distal pedal pulses are 2+ bilaterally. Neuro: Alert and oriented X 3. Moves all extremities spontaneously. Psych: Normal affect.  Labs    Chemistry Recent Labs  Lab 02/12/18 0934 02/13/18 0404 02/14/18 0336  NA 136 138  --   K 3.6 4.2  --   CL 100* 99*  --   CO2 28 29  --   GLUCOSE 247* 181*  --   BUN 17 13  --   CREATININE 0.79 0.81  --   CALCIUM 8.7* 8.9  --   PROT  --   --  6.4*  ALBUMIN  --   --  2.8*  AST  --   --  30  ALT  --   --  43  ALKPHOS  --   --  48  BILITOT  --   --  1.0  GFRNONAA >60 >60  --   GFRAA >60 >60  --   ANIONGAP 8 10  --      Hematology  Recent Labs  Lab 02/15/18 0238 02/16/18 0850 02/17/18 0337  WBC 15.3* 17.0* 12.8*  RBC 4.57 4.34 4.05*  HGB 14.0 13.6 12.5*  HCT 41.3 39.4 37.2*  MCV 90.4 90.8 91.9  MCH 30.6 31.3 30.9  MCHC 33.9 34.5 33.6  RDW 13.0 13.1 13.5  PLT 323 319 301    Cardiac EnzymesNo results for input(s): TROPONINI in the last 168 hours. No results for input(s): TROPIPOC in the last 168 hours.   BNPNo results for input(s): BNP, PROBNP in the last 168 hours.   DDimer No results for input(s): DDIMER in the last 168 hours.    Radiology    No results found.  Cardiac Studies   Cath: 02/06/18  Conclusion     There is mild to moderate left ventricular systolic dysfunction.  LV end diastolic pressure is severely elevated.  Ost RCA to Prox RCA lesion is 100% stenosed.  Mid LM to Dist LM lesion is 85% stenosed.  Ost 1st Mrg lesion is 95% stenosed.  Prox LAD to Mid LAD lesion is 40% stenosed.  Prox LAD lesion is 50% stenosed.  Ost 1st Diag to 1st Diag lesion is 60% stenosed.  1. Severe left main and three-vessel coronary artery disease with  chronically occluded right coronary artery with left-to-right collaterals. The coronary arteries are overall heavily calcified and diffusely diseased. 2. Mildly reduced LV systolic function with an EF of 40%. Echocardiogram showed wall motion abnormalities in the anterior as well as inferior walls.  3. Severely elevated filling pressures in the setting of severely elevated blood pressure. 4. Moderate aortic stenosis with a peak to peak gradient of 20 mmHg.  Recommendations: Transfer to [facility] for CABG and aortic valve replacement. The patient will need to be tuned up given flu. He also appears to be volume overloaded and blood pressure is uncontrolled. I added losartan and will diuresis with furosemide. Heparin drip to be resumed in 8 hours.    TTE: 02/06/18  Study Conclusions  - Left ventricle: The cavity size was normal. There was mild concentric hypertrophy. Systolic function was mildly to moderately reduced. The estimated ejection fraction was in the range of 40% to 45%. Severe hypokinesis of the mid-apicalanteroseptal, anterior, and apical myocardium. Moderate hypokinesis of the apicalinferior myocardium. Features are consistent with a pseudonormal left ventricular filling pattern, with concomitant abnormal relaxation and increased filling pressure (grade 2 diastolic dysfunction). - Aortic valve: There was moderate stenosis. Mean gradient (S): 20 mm Hg. Valve area (VTI): 1.06 cm^2. Valve area (Vmax): 1.27 cm^2. - Mitral valve: Calcified annulus. There was mild regurgitation. Valve area by continuity equation (using LVOT flow): 2.62 cm^2. - Left atrium: The atrium was moderately dilated. - Pulmonary arteries: Systolic pressure was moderately increased. PA peak pressure: 50 mm Hg (S).  Patient Profile     72 y.o. male history of CAD, HTN, HL, DMII, Tob abuse, and COPDwho was [facility] with dyspnea, pulm edema, and NSTEMI, and was  subsequently found to have severe distal LM dzs, along with moderate AS. Planned for CABG with AVr once stable from a respiratory stand point. TCTS and PCCM following.  Assessment & Plan    1. NSTEMI/CAD: Cath 2/21 showed severe dist LM and OM1 dzs with CTO of the RCA. Also w/ mod Ao Stenosis.Seen by TCTS with plans for CABG with AVR once improved from a respiratory standpoint. Tenatively planned for Wednesday.  -remains on IV heparin, ASA, BB, statin, ARB  2. Essential HTN: stable with current therapy  3. Moderate AS - CVTSplans for AVR at time of CABG  4. ICM/Acute systolic CHF: EF 54-27%, remains net negative.  - continue IV lasix 20mg  BID for now. Weight stable. Check Cr in the am  5. Hyperlipidemia with LDL goal <70. - continue high dose atorvastatin 80mg  daily and Zetia 10mg  daily - LDL 74 on 02/06/2018  6. 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 COPDexacerbation  -Cont inhalers/nebs - steroids dosing per PCCM.   8.Influenza A -Tamiflu completed, and has been afebrile since 2/21.  9. CAP - WBC 14.7>>17.0>>12.8. PCT neg. Urine strep and RVP PCR negative.  - completed Zithromax - PCCM following, appreciate assistance  Signed, Laverda Page, NP  02/17/2018, 7:29 AM  Pager # [phone]   For questions or updates, please contact [facility] Please consult [email or web address] for contact info under Cardiology/STEMI.    Patient seen and examined. Agree with assessment and plan. No recurrent chest pain or dyspnea.  Maintaining sinus rhythm.. Tentatively scheduled for AVR/CABGon Wednesday.   Lennette Bihari, MD, [facility] 02/17/2018 9:48 AM

## 2018-02-17 NOTE — Progress Notes (Signed)
CARDIAC REHAB PHASE I   PRE:  Rate/Rhythm: 86 SR  BP:  Supine:   Sitting: 113/53  Standing:    SaO2: 94%RA  MODE:  Ambulation: 200    Then 750 ft   POST:  Rate/Rhythm: 93 SR  BP:  Supine:   Sitting: 129/53  Standing:    SaO2: 89-94%RA 2671-2458 Came to see pt to walk. Walked 200 ft on IV bleeding. Took pt back to room to tape. Zorita Pang arrived for [employer]. Left room and returned when [facility] left. Pt then walked 750 ft with steady gait on RA with no CP. Stated he did not feel SOB. Gave pt IS and he demonstrated 1250-1750 ml.    Luetta Nutting, RN BSN  02/17/2018 10:44 AM

## 2018-02-18 ENCOUNTER — Inpatient Hospital Stay (HOSPITAL_COMMUNITY): Payer: Medicare Other

## 2018-02-18 ENCOUNTER — Encounter (HOSPITAL_COMMUNITY): Payer: Self-pay | Admitting: Certified Registered Nurse Anesthetist

## 2018-02-18 LAB — GLUCOSE, CAPILLARY
Glucose-Capillary: 169 mg/dL — ABNORMAL HIGH (ref 65–99)
Glucose-Capillary: 167 mg/dL — ABNORMAL HIGH (ref 65–99)
Glucose-Capillary: 276 mg/dL — ABNORMAL HIGH (ref 65–99)
Glucose-Capillary: 164 mg/dL — ABNORMAL HIGH (ref 65–99)

## 2018-02-18 LAB — COMPREHENSIVE METABOLIC PANEL
ALT: 40 U/L (ref 17–63)
AST: 20 U/L (ref 15–41)
Albumin: 2.8 g/dL — ABNORMAL LOW (ref 3.5–5.0)
Alkaline Phosphatase: 49 U/L (ref 38–126)
Anion gap: 9 (ref 5–15)
BUN: 30 mg/dL — ABNORMAL HIGH (ref 6–20)
CO2: 26 mmol/L (ref 22–32)
Calcium: 9.2 mg/dL (ref 8.9–10.3)
Chloride: 105 mmol/L (ref 101–111)
Creatinine, Ser: 1.01 mg/dL (ref 0.61–1.24)
GFR calc Af Amer: >60 mL/min (ref >60)
GFR calc non Af Amer: >60 mL/min (ref >60)
Glucose, Bld: 213 mg/dL — ABNORMAL HIGH (ref 65–99)
Potassium: 3.8 mmol/L (ref 3.5–5.1)
Sodium: 140 mmol/L (ref 135–145)
Total Bilirubin: 0.8 mg/dL (ref 0.3–1.2)
Total Protein: 6.1 g/dL — ABNORMAL LOW (ref 6.5–8.1)

## 2018-02-18 LAB — CBC
HCT: 36.5 % — ABNORMAL LOW (ref 39.0–52.0)
Hemoglobin: 12.2 g/dL — ABNORMAL LOW (ref 13.0–17.0)
MCH: 30.3 pg (ref 26.0–34.0)
MCHC: 33.4 g/dL (ref 30.0–36.0)
MCV: 90.8 fL (ref 78.0–100.0)
Platelets: 307 10*3/uL (ref 150–400)
RBC: 4.02 MIL/uL — ABNORMAL LOW (ref 4.22–5.81)
RDW: 13.2 % (ref 11.5–15.5)
WBC: 13.1 10*3/uL — ABNORMAL HIGH (ref 4.0–10.5)

## 2018-02-18 LAB — TYPE AND SCREEN
ABO/RH(D): O NEG
Antibody Screen: NEGATIVE

## 2018-02-18 LAB — BRAIN NATRIURETIC PEPTIDE: B Natriuretic Peptide: 167.8 pg/mL — ABNORMAL HIGH (ref 0.0–100.0)

## 2018-02-18 LAB — ABO/RH: ABO/RH(D): O NEG

## 2018-02-18 LAB — HEPARIN LEVEL (UNFRACTIONATED): Heparin Unfractionated: 0.66 IU/mL (ref 0.30–0.70)

## 2018-02-18 MED ORDER — MAGNESIUM SULFATE 50 % IJ SOLN
40.0000 meq | INTRAMUSCULAR | Status: DC
  Filled 2018-02-18: qty 9.85

## 2018-02-18 MED ORDER — TRANEXAMIC ACID 1000 MG/10ML IV SOLN
1.5000 mg/kg/h | INTRAVENOUS | Status: DC
  Filled 2018-02-18: qty 25

## 2018-02-18 MED ORDER — SODIUM CHLORIDE 0.9 % IV SOLN
30.0000 ug/min | INTRAVENOUS | Status: DC
  Filled 2018-02-18: qty 2

## 2018-02-18 MED ORDER — SODIUM CHLORIDE 0.9 % IV SOLN
750.0000 mg | INTRAVENOUS | Status: DC
  Filled 2018-02-18: qty 750

## 2018-02-18 MED ORDER — TRANEXAMIC ACID (OHS) PUMP PRIME SOLUTION
2.0000 mg/kg | INTRAVENOUS | Status: DC
  Filled 2018-02-18: qty 1.49

## 2018-02-18 MED ORDER — CHLORHEXIDINE GLUCONATE 4 % EX LIQD
60.0000 mL | Freq: Once | CUTANEOUS | Status: AC
  Administered 2018-02-18: 4 via TOPICAL
  Filled 2018-02-18: qty 60

## 2018-02-18 MED ORDER — SODIUM CHLORIDE 0.9 % IV SOLN
1.5000 g | INTRAVENOUS | Status: DC
  Filled 2018-02-18: qty 1.5

## 2018-02-18 MED ORDER — MILRINONE LACTATE IN DEXTROSE 20-5 MG/100ML-% IV SOLN
0.1250 ug/kg/min | INTRAVENOUS | Status: DC
Start: 2018-02-19 — End: 2018-02-19
  Filled 2018-02-18: qty 100

## 2018-02-18 MED ORDER — CHLORHEXIDINE GLUCONATE 4 % EX LIQD
60.0000 mL | Freq: Once | CUTANEOUS | Status: AC
  Administered 2018-02-19: 4 via TOPICAL
  Filled 2018-02-18: qty 60

## 2018-02-18 MED ORDER — BISACODYL 5 MG PO TBEC
5.0000 mg | DELAYED_RELEASE_TABLET | Freq: Once | ORAL | Status: AC
  Administered 2018-02-18: 5 mg via ORAL
  Filled 2018-02-18: qty 1

## 2018-02-18 MED ORDER — SODIUM CHLORIDE 0.9 % IV SOLN
INTRAVENOUS | Status: AC
  Administered 2018-02-19: 1000 mL
  Filled 2018-02-18: qty 1000

## 2018-02-18 MED ORDER — CHLORHEXIDINE GLUCONATE 0.12 % MT SOLN
15.0000 mL | Freq: Once | OROMUCOSAL | Status: AC
  Administered 2018-02-19: 15 mL via OROMUCOSAL
  Filled 2018-02-18: qty 15

## 2018-02-18 MED ORDER — INSULIN REGULAR HUMAN 100 UNIT/ML IJ SOLN
INTRAMUSCULAR | Status: DC
  Filled 2018-02-18: qty 1

## 2018-02-18 MED ORDER — TEMAZEPAM 15 MG PO CAPS
15.0000 mg | ORAL_CAPSULE | Freq: Once | ORAL | Status: AC | PRN
  Administered 2018-02-19: 15 mg via ORAL
  Filled 2018-02-18: qty 1

## 2018-02-18 MED ORDER — METOPROLOL TARTRATE 12.5 MG HALF TABLET
12.5000 mg | ORAL_TABLET | Freq: Once | ORAL | Status: AC
  Administered 2018-02-19: 12.5 mg via ORAL
  Filled 2018-02-18: qty 1

## 2018-02-18 MED ORDER — PLASMA-LYTE 148 IV SOLN
INTRAVENOUS | Status: AC
  Administered 2018-02-19: 500 mL
  Filled 2018-02-18: qty 2.5

## 2018-02-18 MED ORDER — KENNESTONE BLOOD CARDIOPLEGIA (KBC) MANNITOL SYRINGE (20%, 32ML)
32.0000 mL | Freq: Once | INTRAVENOUS | Status: DC
  Filled 2018-02-18: qty 32

## 2018-02-18 MED ORDER — DEXMEDETOMIDINE HCL IN NACL 400 MCG/100ML IV SOLN
0.1000 ug/kg/h | INTRAVENOUS | Status: DC
  Filled 2018-02-18: qty 100

## 2018-02-18 MED ORDER — NITROGLYCERIN IN D5W 200-5 MCG/ML-% IV SOLN
2.0000 ug/min | INTRAVENOUS | Status: DC
  Filled 2018-02-18: qty 250

## 2018-02-18 MED ORDER — SODIUM CHLORIDE 0.9 % IV SOLN
INTRAVENOUS | Status: DC
  Filled 2018-02-18: qty 30

## 2018-02-18 MED ORDER — KENNESTONE BLOOD CARDIOPLEGIA VIAL
13.0000 mL | Freq: Once | Status: DC
  Filled 2018-02-18: qty 13

## 2018-02-18 MED ORDER — VANCOMYCIN HCL 10 G IV SOLR
1250.0000 mg | INTRAVENOUS | Status: DC
  Filled 2018-02-18: qty 1250

## 2018-02-18 MED ORDER — TRANEXAMIC ACID (OHS) BOLUS VIA INFUSION
15.0000 mg/kg | INTRAVENOUS | Status: AC
  Administered 2018-02-19: 1116 mg via INTRAVENOUS
  Filled 2018-02-18: qty 1116

## 2018-02-18 MED ORDER — POTASSIUM CHLORIDE 2 MEQ/ML IV SOLN
80.0000 meq | INTRAVENOUS | Status: DC
  Filled 2018-02-18: qty 40

## 2018-02-18 MED ORDER — EPINEPHRINE PF 1 MG/ML IJ SOLN
0.0000 ug/min | INTRAVENOUS | Status: DC
  Filled 2018-02-18: qty 4

## 2018-02-18 MED ORDER — DOPAMINE-DEXTROSE 3.2-5 MG/ML-% IV SOLN
0.0000 ug/kg/min | INTRAVENOUS | Status: DC
  Filled 2018-02-18: qty 250

## 2018-02-18 NOTE — Progress Notes (Addendum)
Progress Note  Patient Name: Harold Parker Date of Encounter: 02/18/2018  Primary Cardiologist: No primary care provider on file.  Subjective   Feeling well. Has been off O2 with stable sats for 36 hours now.   Inpatient Medications    Scheduled Meds: . amLODipine  5 mg Oral Daily  . aspirin EC  81 mg Oral Daily  . atorvastatin  80 mg Oral q1800  . [START ON 02/19/2018] chlorhexidine  15 mL Mouth/Throat Once  . ezetimibe  10 mg Oral Daily  . furosemide  20 mg Intravenous BID  . [START ON 02/19/2018] heparin-papaverine-plasmalyte irrigation   Irrigation To OR  . insulin aspart  0-15 Units Subcutaneous TID WC  . isosorbide mononitrate  60 mg Oral Daily  . [START ON 02/19/2018] [facility] ([facility]) lidocaine 2% Syringe (13mL)  13 mL Intracoronary Once  . [START ON 02/19/2018] [facility] ([facility]) lidocaine 2% Syringe (13mL)  13 mL Intracoronary Once  . [START ON 02/19/2018] [facility] ([facility]) mannitol 20% Syringe (32mL)  32 mL Intracoronary Once  . [START ON 02/19/2018] [facility] ([facility]) mannitol 20% Syringe (32mL)  32 mL Intracoronary Once  . losartan  100 mg Oral Daily  . [START ON 02/19/2018] magnesium sulfate  40 mEq Other To OR  . [START ON 02/19/2018] metoprolol tartrate  12.5 mg Oral Once  . metoprolol tartrate  25 mg Oral BID  . nicotine  14 mg Transdermal Daily  . [START ON 02/19/2018] potassium chloride  80 mEq Other To OR  . potassium chloride  20 mEq Oral BID  . [START ON 02/19/2018] predniSONE  10 mg Oral Once  . sodium chloride flush  3 mL Intravenous Q12H  . [START ON 02/19/2018] tranexamic acid  15 mg/kg Intravenous To OR  . [START ON 02/19/2018] tranexamic acid  2 mg/kg Intracatheter To OR  . [START ON 02/19/2018] vancomycin 1000 mg in NS (1000 ml) irrigation for Dr. Cornelius Moras case   Irrigation To OR   Continuous Infusions: . sodium chloride    . [START ON 02/19/2018] cefUROXime (ZINACEF)  IV    . [START ON 02/19/2018]  cefUROXime (ZINACEF)  IV    . [START ON 02/19/2018] dexmedetomidine    . [START ON 02/19/2018] DOPamine    . [START ON 02/19/2018] epinephrine    . [START ON 02/19/2018] heparin 30,000 units/NS 1000 mL solution for CELLSAVER    . heparin 1,550 Units/hr (02/17/18 1533)  . [START ON 02/19/2018] insulin (NOVOLIN-R) infusion    . [START ON 02/19/2018] milrinone    . [START ON 02/19/2018] nitroGLYCERIN    . [START ON 02/19/2018] phenylephrine 20mg /290mL NS (0.08mg /ml) infusion    . [START ON 02/19/2018] tranexamic acid (CYKLOKAPRON) infusion ([facility])    . [START ON 02/19/2018] vancomycin     PRN Meds: sodium chloride, acetaminophen, ALPRAZolam, guaiFENesin-dextromethorphan, ipratropium-albuterol, nitroGLYCERIN, ondansetron (ZOFRAN) IV, sodium chloride flush, temazepam   Vital Signs    Vitals:   02/17/18 2006 02/17/18 2142 02/17/18 2203 02/18/18 0500  BP: 134/70   (!) 157/60  Pulse: 96  96   Resp:      Temp:    98 F (36.7 C)  TempSrc:    Oral  SpO2: 95% 95%    Weight:    164 lb 1.6 oz (74.4 kg)  Height:        Intake/Output Summary (Last 24 hours) at 02/18/2018 1045 Last data filed at 02/18/2018 0500 Gross per 24 hour  Intake 1080 ml  Output 850 ml  Net 230 ml   Filed Weights   02/16/18 0455 02/17/18 0345 02/18/18 0500  Weight: 163 lb 14.4 oz (74.3 kg) 164 lb 8 oz (74.6 kg) 164 lb 1.6 oz (74.4 kg)    Telemetry    SR - Personally Reviewed  ECG    N/a - Personally Reviewed  Physical Exam   General: Well developed, well nourished, male appearing in no acute distress. Head: Normocephalic, atraumatic.  Neck: Supple, JVD. Lungs:  Resp regular and unlabored, CTA. Heart: RRR, S1, S2, no S3, S4, 2/6 murmur; no rub. Abdomen: Soft, non-tender, non-distended with normoactive bowel sounds. No hepatomegaly. No rebound/guarding. No obvious abdominal masses. Extremities: No clubbing, cyanosis, edema. Distal pedal pulses are 2+ bilaterally. Neuro: Alert and oriented X 3. Moves all extremities  spontaneously. Psych: Normal affect.  Labs    Chemistry Recent Labs  Lab 02/12/18 [id number] 02/13/18 0404 02/14/18 0336 02/18/18 0402  NA 136 138  --  140  K 3.6 4.2  --  3.8  CL 100* 99*  --  105  CO2 28 29  --  26  GLUCOSE 247* 181*  --  213*  BUN 17 13  --  30*  CREATININE 0.79 0.81  --  1.01  CALCIUM 8.7* 8.9  --  9.2  PROT  --   --  6.4* 6.1*  ALBUMIN  --   --  2.8* 2.8*  AST  --   --  30 20  ALT  --   --  43 40  ALKPHOS  --   --  48 49  BILITOT  --   --  1.0 0.8  GFRNONAA >60 >60  --  >60  GFRAA >60 >60  --  >60  ANIONGAP 8 10  --  9     Hematology Recent Labs  Lab 02/16/18 0850 02/17/18 0337 02/18/18 0402  WBC 17.0* 12.8* 13.1*  RBC 4.34 4.05* 4.02*  HGB 13.6 12.5* 12.2*  HCT 39.4 37.2* 36.5*  MCV 90.8 91.9 90.8  MCH 31.3 30.9 30.3  MCHC 34.5 33.6 33.4  RDW 13.1 13.5 13.2  PLT 319 301 307    Cardiac EnzymesNo results for input(s): TROPONINI in the last 168 hours. No results for input(s): TROPIPOC in the last 168 hours.   BNP Recent Labs  Lab 02/18/18 0402  BNP 167.8*     DDimer No results for input(s): DDIMER in the last 168 hours.    Radiology    Dg Chest 2 View  Result Date: 02/18/2018 CLINICAL DATA:  COPD, CHF EXAM: CHEST  2 VIEW COMPARISON:  02/12/2018 FINDINGS: Heart and mediastinal contours are within normal limits. No focal opacities or effusions. No acute bony abnormality. IMPRESSION: No active cardiopulmonary disease. Electronically Signed   By: Charlett Nose M.D.   On: 02/18/2018 08:46    Cardiac Studies   Cath: 02/06/18  Conclusion     There is mild to moderate left ventricular systolic dysfunction.  LV end diastolic pressure is severely elevated.  Ost RCA to Prox RCA lesion is 100% stenosed.  Mid LM to Dist LM lesion is 85% stenosed.  Ost 1st Mrg lesion is 95% stenosed.  Prox LAD to Mid LAD lesion is 40% stenosed.  Prox LAD lesion is 50% stenosed.  Ost 1st Diag to 1st Diag lesion is 60% stenosed.  1. Severe  left main and three-vessel coronary artery disease with chronically occluded right coronary artery with left-to-right collaterals. The coronary arteries are overall heavily calcified and diffusely diseased. 2. Mildly reduced  LV systolic function with an EF of 40%. Echocardiogram showed wall motion abnormalities in the anterior as well as inferior walls.  3. Severely elevated filling pressures in the setting of severely elevated blood pressure. 4. Moderate aortic stenosis with a peak to peak gradient of 20 mmHg.  Recommendations: Transfer to [facility] for CABG and aortic valve replacement. The patient will need to be tuned up given flu. He also appears to be volume overloaded and blood pressure is uncontrolled. I added losartan and will diuresis with furosemide. Heparin drip to be resumed in 8 hours.    TTE: 02/06/18  Study Conclusions  - Left ventricle: The cavity size was normal. There was mild concentric hypertrophy. Systolic function was mildly to moderately reduced. The estimated ejection fraction was in the range of 40% to 45%. Severe hypokinesis of the mid-apicalanteroseptal, anterior, and apical myocardium. Moderate hypokinesis of the apicalinferior myocardium. Features are consistent with a pseudonormal left ventricular filling pattern, with concomitant abnormal relaxation and increased filling pressure (grade 2 diastolic dysfunction). - Aortic valve: There was moderate stenosis. Mean gradient (S): 20 mm Hg. Valve area (VTI): 1.06 cm^2. Valve area (Vmax): 1.27 cm^2. - Mitral valve: Calcified annulus. There was mild regurgitation. Valve area by continuity equation (using LVOT flow): 2.62 cm^2. - Left atrium: The atrium was moderately dilated. - Pulmonary arteries: Systolic pressure was moderately increased. PA peak pressure: 50 mm Hg (S).  Patient Profile     72 y.o. male with history of CAD, HTN, HL, DMII, Tob abuse, and COPDwho was admitted to  [facility] with dyspnea, pulm edema, and NSTEMI, and was subsequently found to have severe distal LM dzs, along with moderate AS. Planned for CABG with AVr once stable from a respiratory stand point. TCTS and PCCM following.  Assessment & Plan    1. NSTEMI/CAD: Cath 2/21 showed severe dist LM and OM1 dzs with CTO of the RCA. Also w/ mod Ao Stenosis.Seen by TCTS with plans for CABG with AVR now that he has improved. Planned for surgery in the am.  -remains on IV heparin, ASA, BB, statin, ARB  2. Essential HTN: stable with current therapy  3. Moderate AS - CVTSplans for AVR at time of CABG  4. ICM/Acute systolic CHF: EF 93-23%, remains net negative.  - continue IV lasix 20mg  BID, suspect ok to transition to oral dosing. Weight stable. Cr stable.   5. Hyperlipidemia with LDL goal <70. - continue high dose atorvastatin 80mg  daily and Zetia 10mg  daily - LDL 74 on 02/06/2018  6. 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 COPDexacerbation  -Cont inhalers/nebs - steroids dosing per PCCM. Planned to complete tomorrow.  8.Influenza A -Tamiflu completed, and has been afebrile since 2/21.  9. CAP - WBC 14.7>>17.0>>12.8. PCT neg. Urine strep and RVP PCR negative.  -completedZithromax - PCCM following, appreciate assistance. Stable from a pulmonary standpoint.   Signed, Laverda Page, NP  02/18/2018, 10:45 AM  Pager # [phone]   For questions or updates, please contact [facility] Please consult [email or web address] for contact info under Cardiology/STEMI.   Patient seen and examined. Agree with assessment and plan. Feels well; no recurrent chest pain or dyspnea.  For AVR/CABG tomorrow.   Lennette Bihari, MD, [facility] 02/18/2018 11:17 AM

## 2018-02-18 NOTE — Progress Notes (Signed)
      [address]       [place] [address]             [phone]     CARDIOTHORACIC SURGERY PROGRESS NOTE  Subjective: No complaints  Objective: Vital signs in last 24 hours: Temp:  [98 F (36.7 C)-99 F (37.2 C)] 98 F (36.7 C) (03/05 0500) Pulse Rate:  [84-96] 96 (03/04 2203) Cardiac Rhythm: Heart block (03/05 0700) Resp:  [20] 20 (03/04 1400) BP: (112-157)/(54-100) 157/60 (03/05 0500) SpO2:  [95 %-96 %] 95 % (03/04 2142) Weight:  [164 lb 1.6 oz (74.4 kg)] 164 lb 1.6 oz (74.4 kg) (03/05 0500)  Physical Exam:  Rhythm:   sinus  Breath sounds: clear  Heart sounds:  RRR w/ systolic murmur  Incisions:  n/a  Abdomen:  soft  Extremities:  warm   Intake/Output from previous day: 03/04 0701 - 03/05 0700 In: 1440 [P.O.:1440] Out: 1250 [Urine:1250] Intake/Output this shift: No intake/output data recorded.  Lab Results: Recent Labs    02/17/18 0337 02/18/18 0402  WBC 12.8* 13.1*  HGB 12.5* 12.2*  HCT 37.2* 36.5*  PLT 301 307   BMET:  Recent Labs    02/18/18 0402  NA 140  K 3.8  CL 105  CO2 26  GLUCOSE 213*  BUN 30*  CREATININE 1.01  CALCIUM 9.2    CBG (last 3)  Recent Labs    02/17/18 1627 02/17/18 1955 02/18/18 0734  GLUCAP 440* 358* 169*   PT/INR:  No results for input(s): LABPROT, INR in the last 72 hours.  CXR:  CHEST  2 VIEW  COMPARISON:  02/12/2018  FINDINGS: Heart and mediastinal contours are within normal limits. No focal opacities or effusions. No acute bony abnormality.  IMPRESSION: No active cardiopulmonary disease.   Electronically Signed   By: Charlett Nose M.D.   On: 02/18/2018 08:46   Assessment/Plan:  Clinically stable/improved.  Pulmonary status probably as good as it is going to get.  For OR tomorrow.   The patient understands and accepts all potential associated risks of surgery including but not limited to risk of death, stroke, myocardial infarction, congestive heart failure, respiratory  failure, renal failure, pneumonia, bleeding requiring blood transfusion and or reexploration, arrhythmia, heart block or bradycardia requiring permanent pacemaker, aortic dissection or other major vascular complication, pleural effusions or other delayed complications related to continued congestive heart failure, and other late complications related to valve replacement including structural valve deterioration and failure, thrombosis, endocarditis, or paravalvular leak, or late recurrence of symptomatic ischemic heart disease.  All questions answered.   Purcell Nails, MD 02/18/2018 10:20 AM

## 2018-02-18 NOTE — Progress Notes (Signed)
ANTICOAGULATION CONSULT NOTE - Follow Up Consult  Pharmacy Consult for Heparin Indication: chest pain/ACS, CAD, awaiting CABG + AVR  No Known Allergies  Patient Measurements: Height: 5\' 6"  (167.6 cm) Weight: 164 lb 1.6 oz (74.4 kg) IBW/kg (Calculated) : 63.8 Heparin Dosing Weight: 74 kg  Vital Signs: Temp: 98 F (36.7 C) (03/05 0500) Temp Source: Oral (03/05 0500) BP: 157/60 (03/05 0500) Pulse Rate: 96 (03/04 2203)  Labs: Recent Labs    02/16/18 0850 02/17/18 0337 02/18/18 0402  HGB 13.6 12.5* 12.2*  HCT 39.4 37.2* 36.5*  PLT 319 301 307  HEPARINUNFRC 0.63 0.52 0.66  CREATININE  --   --  1.01    Estimated Creatinine Clearance: 58.8 mL/min (by C-G formula based on SCr of 1.01 mg/dL).  Assessment:  54 yoM consulted for heparin for ACS. Transfer from [facility] where he presented with pulmonary edema and NSTEMI. Recently completed Tamiflu (2/26) and Azithromycin (2/28) for + Influenza A and CAP/COPD exacerbation. CABG/AVR scheduled for 3/6.     Heparin level is therapeutic (0.66) on 1500 units/hr. CBC trending down some, no bleeding reported.  Heparin to stop at midnight tonight.  Goal of Therapy:  Heparin level 0.3-0.7 units/ml Monitor platelets by anticoagulation protocol: Yes   Plan:   Continue heparin drip at 1550 units/hr, to stop at midnight tonight.  Cancelled am heparin level and CBC.  For CABG and AVR on 3/6.  Prednisone tapered to 20 mg today and 10 mg on 3/6.  Moved am Prednisone dose from 8am to 5am with pre-op Metoprolol.  Dennie Fetters, RPh Pager: [phone] 02/18/2018,9:45 AM

## 2018-02-18 NOTE — Progress Notes (Addendum)
Inpatient Diabetes Program Recommendations  AACE/ADA: New Consensus Statement on Inpatient Glycemic Control (2015)  Target Ranges:  Prepandial:   less than 140 mg/dL      Peak postprandial:   less than 180 mg/dL (1-2 hours)      Critically ill patients:  140 - 180 mg/dL   Lab Results  Component Value Date   GLUCAP 169 (H) 02/18/2018   HGBA1C 7.0 (H) 02/05/2018    Review of Glycemic Control Results for Harold Parker, Harold Parker (MRN [id number]) as of 02/18/2018 10:14  Ref. Range 02/17/2018 07:44 02/17/2018 11:50 02/17/2018 16:27 02/17/2018 19:55 02/18/2018 07:34  Glucose-Capillary Latest Ref Range: 65 - 99 mg/dL 308 (H) 657 (H) 846 (H) 358 (H) 169 (H)   Diabetes history: Type 2 DM Outpatient Diabetes medications: none Current orders for Inpatient glycemic control: Novolog 0-15 Units [facility]  Inpatient Diabetes Program Recommendations:    Noted patient received Prednisone 20 mg this AM and then scheduled to have Prednisone 10 mg X1 for 3/6. Expect BS to remain elevated today. As steroids are tapered, insulin may need adjustments. Also of note patient scheduled for AVR+CABG in the AM and the plan is for insulin drip.   Basal: Consider Lantus 12 Units QD.  As patient eats today prior to NPO status, patient may need meal coverage given post prandials were as high as 440 mg/dL yesterday. Please consider Novolog 3 Units TIDAC (when patient consumes >50% of meal).   Thanks, Lujean Rave, MSN, RNC-OB Diabetes Coordinator [phone] (8a-5p)

## 2018-02-18 NOTE — Progress Notes (Signed)
Nutrition Brief Note  Chart screened due to LOS. Patient was transferred from [facility] to [facility] for cardiac surgery. Plans for AVR/CABG tomorrow.  Spoke with patient, who reports that he has been eating very well. He has a great appetite and has been consuming 100% of meals. Weight has declined slightly with diuresis.  Nutrition focused physical exam completed.  No muscle or subcutaneous fat depletion noticed.  Wt Readings from Last 15 Encounters:  02/18/18 164 lb 1.6 oz (74.4 kg)  02/07/18 167 lb 11.2 oz (76.1 kg)  02/03/18 172 lb (78 kg)    Body mass index is 26.49 kg/m. Patient meets criteria for overweight based on current BMI.   Current diet order is heart healthy, CHO modified; patient is consuming approximately 100% of meals at this time. Labs and medications reviewed.   No nutrition interventions warranted at this time. If nutrition issues arise, please consult RD.   Joaquin Courts, RD, LDN, CNSC Pager [phone] After Hours Pager [phone]

## 2018-02-18 NOTE — Progress Notes (Signed)
CARDIAC REHAB PHASE I   PRE:  Rate/Rhythm: 75 SR  BP:  Supine:   Sitting: 93/74, 118/51  Standing:    SaO2: 98%RA  MODE:  Ambulation: 770 ft   POST:  Rate/Rhythm: 87 SR  BP:  Supine:   Sitting: 104/82  Standing:    SaO2: 97%RA 1125-1150 Pt walked 770 ft on RA with steady gait. Tolerated well. No complaints. Stated he is ready for surgery.   Luetta Nutting, RN BSN  02/18/2018 11:46 AM

## 2018-02-19 ENCOUNTER — Encounter (HOSPITAL_COMMUNITY): Payer: Self-pay | Admitting: Thoracic Surgery (Cardiothoracic Vascular Surgery)

## 2018-02-19 ENCOUNTER — Inpatient Hospital Stay (HOSPITAL_COMMUNITY): Payer: Medicare Other

## 2018-02-19 ENCOUNTER — Inpatient Hospital Stay (HOSPITAL_COMMUNITY)
Admission: AD | Disposition: A | Discharge status: Home Health | Payer: Self-pay | Source: Other Acute Inpatient Hospital | Attending: Cardiovascular Disease

## 2018-02-19 ENCOUNTER — Inpatient Hospital Stay (HOSPITAL_COMMUNITY): Payer: Medicare Other | Admitting: Certified Registered Nurse Anesthetist

## 2018-02-19 DIAGNOSIS — Z951 Presence of aortocoronary bypass graft: Secondary | ICD-10-CM

## 2018-02-19 DIAGNOSIS — Z953 Presence of xenogenic heart valve: Secondary | ICD-10-CM

## 2018-02-19 HISTORY — PX: TEE WITHOUT CARDIOVERSION: SHX5443

## 2018-02-19 HISTORY — PX: CORONARY ARTERY BYPASS GRAFT: SHX141

## 2018-02-19 HISTORY — DX: Presence of xenogenic heart valve: Z95.3

## 2018-02-19 HISTORY — PX: AORTIC VALVE REPLACEMENT: SHX41

## 2018-02-19 HISTORY — DX: Presence of aortocoronary bypass graft: Z95.1

## 2018-02-19 LAB — POCT I-STAT, CHEM 8
BUN: 24 mg/dL — ABNORMAL HIGH (ref 6–20)
BUN: 22 mg/dL — ABNORMAL HIGH (ref 6–20)
BUN: 24 mg/dL — ABNORMAL HIGH (ref 6–20)
BUN: 22 mg/dL — ABNORMAL HIGH (ref 6–20)
BUN: 23 mg/dL — ABNORMAL HIGH (ref 6–20)
BUN: 22 mg/dL — ABNORMAL HIGH (ref 6–20)
BUN: 20 mg/dL (ref 6–20)
Calcium, Ion: 1.3 mmol/L (ref 1.15–1.40)
Calcium, Ion: 1.09 mmol/L — ABNORMAL LOW (ref 1.15–1.40)
Calcium, Ion: 1.28 mmol/L (ref 1.15–1.40)
Calcium, Ion: 1.17 mmol/L (ref 1.15–1.40)
Calcium, Ion: 1.13 mmol/L — ABNORMAL LOW (ref 1.15–1.40)
Calcium, Ion: 1.18 mmol/L (ref 1.15–1.40)
Calcium, Ion: 1.18 mmol/L (ref 1.15–1.40)
Chloride: 101 mmol/L (ref 101–111)
Chloride: 101 mmol/L (ref 101–111)
Chloride: 101 mmol/L (ref 101–111)
Chloride: 102 mmol/L (ref 101–111)
Chloride: 101 mmol/L (ref 101–111)
Chloride: 103 mmol/L (ref 101–111)
Chloride: 105 mmol/L (ref 101–111)
Creatinine, Ser: 0.8 mg/dL (ref 0.61–1.24)
Creatinine, Ser: 0.7 mg/dL (ref 0.61–1.24)
Creatinine, Ser: 0.9 mg/dL (ref 0.61–1.24)
Creatinine, Ser: 0.7 mg/dL (ref 0.61–1.24)
Creatinine, Ser: 0.7 mg/dL (ref 0.61–1.24)
Creatinine, Ser: 0.6 mg/dL — ABNORMAL LOW (ref 0.61–1.24)
Creatinine, Ser: 0.7 mg/dL (ref 0.61–1.24)
Glucose, Bld: 195 mg/dL — ABNORMAL HIGH (ref 65–99)
Glucose, Bld: 152 mg/dL — ABNORMAL HIGH (ref 65–99)
Glucose, Bld: 179 mg/dL — ABNORMAL HIGH (ref 65–99)
Glucose, Bld: 190 mg/dL — ABNORMAL HIGH (ref 65–99)
Glucose, Bld: 216 mg/dL — ABNORMAL HIGH (ref 65–99)
Glucose, Bld: 182 mg/dL — ABNORMAL HIGH (ref 65–99)
Glucose, Bld: 132 mg/dL — ABNORMAL HIGH (ref 65–99)
HCT: 37 % — ABNORMAL LOW (ref 39.0–52.0)
HCT: 32 % — ABNORMAL LOW (ref 39.0–52.0)
HCT: 33 % — ABNORMAL LOW (ref 39.0–52.0)
HCT: 30 % — ABNORMAL LOW (ref 39.0–52.0)
HCT: 29 % — ABNORMAL LOW (ref 39.0–52.0)
HCT: 31 % — ABNORMAL LOW (ref 39.0–52.0)
HCT: 28 % — ABNORMAL LOW (ref 39.0–52.0)
Hemoglobin: 12.6 g/dL — ABNORMAL LOW (ref 13.0–17.0)
Hemoglobin: 10.9 g/dL — ABNORMAL LOW (ref 13.0–17.0)
Hemoglobin: 11.2 g/dL — ABNORMAL LOW (ref 13.0–17.0)
Hemoglobin: 10.2 g/dL — ABNORMAL LOW (ref 13.0–17.0)
Hemoglobin: 9.9 g/dL — ABNORMAL LOW (ref 13.0–17.0)
Hemoglobin: 10.5 g/dL — ABNORMAL LOW (ref 13.0–17.0)
Hemoglobin: 9.5 g/dL — ABNORMAL LOW (ref 13.0–17.0)
Potassium: 4.4 mmol/L (ref 3.5–5.1)
Potassium: 4.7 mmol/L (ref 3.5–5.1)
Potassium: 5 mmol/L (ref 3.5–5.1)
Potassium: 5.3 mmol/L — ABNORMAL HIGH (ref 3.5–5.1)
Potassium: 4.7 mmol/L (ref 3.5–5.1)
Potassium: 4.2 mmol/L (ref 3.5–5.1)
Potassium: 4.6 mmol/L (ref 3.5–5.1)
Sodium: 139 mmol/L (ref 135–145)
Sodium: 139 mmol/L (ref 135–145)
Sodium: 139 mmol/L (ref 135–145)
Sodium: 137 mmol/L (ref 135–145)
Sodium: 137 mmol/L (ref 135–145)
Sodium: 140 mmol/L (ref 135–145)
Sodium: 140 mmol/L (ref 135–145)
TCO2: 30 mmol/L (ref 22–32)
TCO2: 28 mmol/L (ref 22–32)
TCO2: 32 mmol/L (ref 22–32)
TCO2: 28 mmol/L (ref 22–32)
TCO2: 29 mmol/L (ref 22–32)
TCO2: 28 mmol/L (ref 22–32)
TCO2: 24 mmol/L (ref 22–32)

## 2018-02-19 LAB — POCT I-STAT 3, ART BLOOD GAS (G3+)
Acid-Base Excess: 4 mmol/L — ABNORMAL HIGH (ref 0.0–2.0)
Acid-Base Excess: 3 mmol/L — ABNORMAL HIGH (ref 0.0–2.0)
Acid-base deficit: 1 mmol/L (ref 0.0–2.0)
Acid-base deficit: 3 mmol/L — ABNORMAL HIGH (ref 0.0–2.0)
Acid-base deficit: 4 mmol/L — ABNORMAL HIGH (ref 0.0–2.0)
Bicarbonate: 21.2 mmol/L (ref 20.0–28.0)
Bicarbonate: 22.9 mmol/L (ref 20.0–28.0)
Bicarbonate: 26.1 mmol/L (ref 20.0–28.0)
Bicarbonate: 29.5 mmol/L — ABNORMAL HIGH (ref 20.0–28.0)
Bicarbonate: 29.5 mmol/L — ABNORMAL HIGH (ref 20.0–28.0)
O2 Saturation: 100 %
O2 Saturation: 100 %
O2 Saturation: 97 %
O2 Saturation: 97 %
O2 Saturation: 98 %
Patient temperature: 36.4
Patient temperature: 37.2
Patient temperature: 38
TCO2: 22 mmol/L (ref 22–32)
TCO2: 24 mmol/L (ref 22–32)
TCO2: 28 mmol/L (ref 22–32)
TCO2: 31 mmol/L (ref 22–32)
TCO2: 31 mmol/L (ref 22–32)
pCO2 arterial: 40.2 mmHg (ref 32.0–48.0)
pCO2 arterial: 44 mmHg (ref 32.0–48.0)
pCO2 arterial: 51.5 mmHg — ABNORMAL HIGH (ref 32.0–48.0)
pCO2 arterial: 53.5 mmHg — ABNORMAL HIGH (ref 32.0–48.0)
pCO2 arterial: 54 mmHg — ABNORMAL HIGH (ref 32.0–48.0)
pH, Arterial: 7.293 — ABNORMAL LOW (ref 7.350–7.450)
pH, Arterial: 7.325 — ABNORMAL LOW (ref 7.350–7.450)
pH, Arterial: 7.335 — ABNORMAL LOW (ref 7.350–7.450)
pH, Arterial: 7.345 — ABNORMAL LOW (ref 7.350–7.450)
pH, Arterial: 7.366 (ref 7.350–7.450)
pO2, Arterial: 104 mmHg (ref 83.0–108.0)
pO2, Arterial: 105 mmHg (ref 83.0–108.0)
pO2, Arterial: 118 mmHg — ABNORMAL HIGH (ref 83.0–108.0)
pO2, Arterial: 387 mmHg — ABNORMAL HIGH (ref 83.0–108.0)
pO2, Arterial: 410 mmHg — ABNORMAL HIGH (ref 83.0–108.0)

## 2018-02-19 LAB — CREATININE, SERUM
Creatinine, Ser: 0.76 mg/dL (ref 0.61–1.24)
GFR calc Af Amer: >60 mL/min (ref >60)
GFR calc non Af Amer: >60 mL/min (ref >60)

## 2018-02-19 LAB — GLUCOSE, CAPILLARY
Glucose-Capillary: 107 mg/dL — ABNORMAL HIGH (ref 65–99)
Glucose-Capillary: 99 mg/dL (ref 65–99)
Glucose-Capillary: 132 mg/dL — ABNORMAL HIGH (ref 65–99)
Glucose-Capillary: 127 mg/dL — ABNORMAL HIGH (ref 65–99)
Glucose-Capillary: 142 mg/dL — ABNORMAL HIGH (ref 65–99)
Glucose-Capillary: 108 mg/dL — ABNORMAL HIGH (ref 65–99)

## 2018-02-19 LAB — CBC
HCT: 31.3 % — ABNORMAL LOW (ref 39.0–52.0)
HCT: 31.3 % — ABNORMAL LOW (ref 39.0–52.0)
Hemoglobin: 10.4 g/dL — ABNORMAL LOW (ref 13.0–17.0)
Hemoglobin: 10.6 g/dL — ABNORMAL LOW (ref 13.0–17.0)
MCH: 30.1 pg (ref 26.0–34.0)
MCH: 30.8 pg (ref 26.0–34.0)
MCHC: 33.2 g/dL (ref 30.0–36.0)
MCHC: 33.9 g/dL (ref 30.0–36.0)
MCV: 90.7 fL (ref 78.0–100.0)
MCV: 91 fL (ref 78.0–100.0)
Platelets: 198 10*3/uL (ref 150–400)
Platelets: 197 10*3/uL (ref 150–400)
RBC: 3.45 MIL/uL — ABNORMAL LOW (ref 4.22–5.81)
RBC: 3.44 MIL/uL — ABNORMAL LOW (ref 4.22–5.81)
RDW: 13.2 % (ref 11.5–15.5)
RDW: 13.5 % (ref 11.5–15.5)
WBC: 20.9 10*3/uL — ABNORMAL HIGH (ref 4.0–10.5)
WBC: 18.3 10*3/uL — ABNORMAL HIGH (ref 4.0–10.5)

## 2018-02-19 LAB — HEMOGLOBIN AND HEMATOCRIT, BLOOD
HCT: 30.8 % — ABNORMAL LOW (ref 39.0–52.0)
Hemoglobin: 10.5 g/dL — ABNORMAL LOW (ref 13.0–17.0)

## 2018-02-19 LAB — PROTIME-INR
INR: 1.37
Prothrombin Time: 16.7 seconds — ABNORMAL HIGH (ref 11.4–15.2)

## 2018-02-19 LAB — BLOOD GAS, ARTERIAL
Acid-Base Excess: 3.7 mmol/L — ABNORMAL HIGH (ref 0.0–2.0)
Bicarbonate: 27.8 mmol/L (ref 20.0–28.0)
Drawn by: 51831
FIO2: 21
O2 Saturation: 90 %
Patient temperature: 98.6
pCO2 arterial: 42.9 mmHg (ref 32.0–48.0)
pH, Arterial: 7.428 (ref 7.350–7.450)
pO2, Arterial: 59 mmHg — ABNORMAL LOW (ref 83.0–108.0)

## 2018-02-19 LAB — APTT: aPTT: 36 seconds (ref 24–36)

## 2018-02-19 LAB — POCT I-STAT 4, (NA,K, GLUC, HGB,HCT)
Glucose, Bld: 116 mg/dL — ABNORMAL HIGH (ref 65–99)
HCT: 30 % — ABNORMAL LOW (ref 39.0–52.0)
Hemoglobin: 10.2 g/dL — ABNORMAL LOW (ref 13.0–17.0)
Potassium: 3.9 mmol/L (ref 3.5–5.1)
Sodium: 143 mmol/L (ref 135–145)

## 2018-02-19 LAB — MAGNESIUM: Magnesium: 2.9 mg/dL — ABNORMAL HIGH (ref 1.7–2.4)

## 2018-02-19 LAB — PLATELET COUNT: Platelets: 215 10*3/uL (ref 150–400)

## 2018-02-19 SURGERY — CORONARY ARTERY BYPASS GRAFTING (CABG)
Anesthesia: General | Site: Chest

## 2018-02-19 MED ORDER — SODIUM CHLORIDE 0.45 % IV SOLN
INTRAVENOUS | Status: DC | PRN
  Administered 2018-02-19: 16:00:00 via INTRAVENOUS

## 2018-02-19 MED ORDER — INSULIN REGULAR BOLUS VIA INFUSION
0.0000 [IU] | Freq: Three times a day (TID) | INTRAVENOUS | Status: DC
  Filled 2018-02-19: qty 10

## 2018-02-19 MED ORDER — MIDAZOLAM HCL 2 MG/2ML IJ SOLN
INTRAMUSCULAR | Status: DC | PRN
  Administered 2018-02-19: 1 mg via INTRAVENOUS
  Administered 2018-02-19: 2 mg via INTRAVENOUS
  Administered 2018-02-19: 3 mg via INTRAVENOUS
  Administered 2018-02-19: 1 mg via INTRAVENOUS
  Administered 2018-02-19: 3 mg via INTRAVENOUS

## 2018-02-19 MED ORDER — ACETAMINOPHEN 650 MG RE SUPP
650.0000 mg | Freq: Once | RECTAL | Status: AC
  Administered 2018-02-19: 650 mg via RECTAL

## 2018-02-19 MED ORDER — ALBUMIN HUMAN 5 % IV SOLN
250.0000 mL | INTRAVENOUS | Status: DC | PRN
  Administered 2018-02-19: 250 mL via INTRAVENOUS

## 2018-02-19 MED ORDER — PROTAMINE SULFATE 10 MG/ML IV SOLN
INTRAVENOUS | Status: DC | PRN
  Administered 2018-02-19: 10 mg via INTRAVENOUS
  Administered 2018-02-19: 250 mg via INTRAVENOUS

## 2018-02-19 MED ORDER — ROCURONIUM 10MG/ML (10ML) SYRINGE FOR MEDFUSION PUMP - OPTIME
INTRAVENOUS | Status: DC | PRN
  Administered 2018-02-19: 20 mg via INTRAVENOUS
  Administered 2018-02-19 (×4): 50 mg via INTRAVENOUS

## 2018-02-19 MED ORDER — LEVALBUTEROL HCL 1.25 MG/0.5ML IN NEBU
1.2500 mg | INHALATION_SOLUTION | Freq: Four times a day (QID) | RESPIRATORY_TRACT | Status: DC
  Administered 2018-02-20 – 2018-02-21 (×5): 1.25 mg via RESPIRATORY_TRACT
  Filled 2018-02-19 (×5): qty 0.5

## 2018-02-19 MED ORDER — METOPROLOL TARTRATE 5 MG/5ML IV SOLN
2.5000 mg | INTRAVENOUS | Status: DC | PRN
  Administered 2018-02-20: 5 mg via INTRAVENOUS
  Filled 2018-02-19: qty 5

## 2018-02-19 MED ORDER — DEXMEDETOMIDINE HCL IN NACL 200 MCG/50ML IV SOLN
INTRAVENOUS | Status: DC | PRN
  Administered 2018-02-19: .3 ug/kg/h via INTRAVENOUS

## 2018-02-19 MED ORDER — FENTANYL CITRATE (PF) 250 MCG/5ML IJ SOLN
INTRAMUSCULAR | Status: AC
  Filled 2018-02-19: qty 25

## 2018-02-19 MED ORDER — LACTATED RINGERS IV SOLN
INTRAVENOUS | Status: DC | PRN
  Administered 2018-02-19: 08:00:00 via INTRAVENOUS

## 2018-02-19 MED ORDER — LACTATED RINGERS IV SOLN
INTRAVENOUS | Status: DC
  Administered 2018-02-19: 18:00:00 via INTRAVENOUS

## 2018-02-19 MED ORDER — MORPHINE SULFATE (PF) 2 MG/ML IV SOLN: 1.0000 mg | INTRAVENOUS | Status: DC | PRN

## 2018-02-19 MED ORDER — MAGNESIUM SULFATE 4 GM/100ML IV SOLN
4.0000 g | Freq: Once | INTRAVENOUS | Status: AC
  Administered 2018-02-19: 4 g via INTRAVENOUS
  Filled 2018-02-19: qty 100

## 2018-02-19 MED ORDER — VANCOMYCIN HCL IN DEXTROSE 1-5 GM/200ML-% IV SOLN
1000.0000 mg | Freq: Once | INTRAVENOUS | Status: AC
  Administered 2018-02-19: 1000 mg via INTRAVENOUS
  Filled 2018-02-19: qty 200

## 2018-02-19 MED ORDER — METOPROLOL TARTRATE 25 MG/10 ML ORAL SUSPENSION: 12.5000 mg | Freq: Two times a day (BID) | ORAL | Status: DC

## 2018-02-19 MED ORDER — OXYCODONE HCL 5 MG PO TABS
5.0000 mg | ORAL_TABLET | ORAL | Status: DC | PRN
  Administered 2018-02-20 – 2018-02-26 (×20): 10 mg via ORAL
  Filled 2018-02-19 (×20): qty 2

## 2018-02-19 MED ORDER — NITROGLYCERIN IN D5W 200-5 MCG/ML-% IV SOLN: 0.0000 ug/min | INTRAVENOUS | Status: DC

## 2018-02-19 MED ORDER — FAMOTIDINE IN NACL 20-0.9 MG/50ML-% IV SOLN
20.0000 mg | Freq: Two times a day (BID) | INTRAVENOUS | Status: AC
  Administered 2018-02-19 (×2): 20 mg via INTRAVENOUS
  Filled 2018-02-19: qty 50

## 2018-02-19 MED ORDER — ACETAMINOPHEN 500 MG PO TABS
1000.0000 mg | ORAL_TABLET | Freq: Four times a day (QID) | ORAL | Status: AC
  Administered 2018-02-20 – 2018-02-24 (×16): 1000 mg via ORAL
  Filled 2018-02-19 (×17): qty 2

## 2018-02-19 MED ORDER — FENTANYL CITRATE (PF) 250 MCG/5ML IJ SOLN
INTRAMUSCULAR | Status: AC
  Filled 2018-02-19: qty 10

## 2018-02-19 MED ORDER — MIDAZOLAM HCL 2 MG/2ML IJ SOLN
2.0000 mg | INTRAMUSCULAR | Status: DC | PRN
  Administered 2018-02-19: 2 mg via INTRAVENOUS
  Filled 2018-02-19: qty 2

## 2018-02-19 MED ORDER — SODIUM CHLORIDE 0.9 % IV SOLN
INTRAVENOUS | Status: DC | PRN
  Administered 2018-02-19: 2.7 [IU]/h via INTRAVENOUS

## 2018-02-19 MED ORDER — LACTATED RINGERS IV SOLN: 500.0000 mL | Freq: Once | INTRAVENOUS | Status: DC | PRN

## 2018-02-19 MED ORDER — BISACODYL 5 MG PO TBEC
10.0000 mg | DELAYED_RELEASE_TABLET | Freq: Every day | ORAL | Status: DC
  Administered 2018-02-20 – 2018-02-24 (×5): 10 mg via ORAL
  Filled 2018-02-19 (×6): qty 2

## 2018-02-19 MED ORDER — MORPHINE SULFATE (PF) 4 MG/ML IV SOLN
1.0000 mg | INTRAVENOUS | Status: DC | PRN
  Administered 2018-02-20 – 2018-02-21 (×7): 2 mg via INTRAVENOUS
  Filled 2018-02-19 (×4): qty 1

## 2018-02-19 MED ORDER — LEVALBUTEROL HCL 1.25 MG/0.5ML IN NEBU
INHALATION_SOLUTION | RESPIRATORY_TRACT | Status: AC
Start: 2018-02-19 — End: 2018-02-19
  Filled 2018-02-19: qty 0.5

## 2018-02-19 MED ORDER — SODIUM CHLORIDE 0.9 % IV SOLN: INTRAVENOUS | Status: DC

## 2018-02-19 MED ORDER — POTASSIUM CHLORIDE 10 MEQ/50ML IV SOLN
10.0000 meq | INTRAVENOUS | Status: AC
  Administered 2018-02-19 (×3): 10 meq via INTRAVENOUS

## 2018-02-19 MED ORDER — PHENYLEPHRINE HCL 10 MG/ML IJ SOLN
INTRAVENOUS | Status: DC | PRN
  Administered 2018-02-19: 15 ug/min via INTRAVENOUS

## 2018-02-19 MED ORDER — DOCUSATE SODIUM 100 MG PO CAPS
200.0000 mg | ORAL_CAPSULE | Freq: Every day | ORAL | Status: DC
  Administered 2018-02-21 – 2018-02-25 (×5): 200 mg via ORAL
  Filled 2018-02-19 (×7): qty 2

## 2018-02-19 MED ORDER — ASPIRIN 81 MG PO CHEW: 324.0000 mg | CHEWABLE_TABLET | Freq: Every day | ORAL | Status: DC

## 2018-02-19 MED ORDER — PHENYLEPHRINE HCL 10 MG/ML IJ SOLN
0.0000 ug/min | INTRAMUSCULAR | Status: DC
  Administered 2018-02-20: 15 ug/min via INTRAVENOUS
  Filled 2018-02-19: qty 2

## 2018-02-19 MED ORDER — SODIUM CHLORIDE 0.9 % IV SOLN
INTRAVENOUS | Status: DC | PRN
  Administered 2018-02-19: 1.5 g via INTRAVENOUS
  Administered 2018-02-19: .75 g via INTRAVENOUS

## 2018-02-19 MED ORDER — ACETAMINOPHEN 160 MG/5ML PO SOLN
1000.0000 mg | Freq: Four times a day (QID) | ORAL | Status: DC
  Administered 2018-02-19: 1000 mg
  Filled 2018-02-19: qty 40.6

## 2018-02-19 MED ORDER — METOPROLOL TARTRATE 12.5 MG HALF TABLET
12.5000 mg | ORAL_TABLET | Freq: Two times a day (BID) | ORAL | Status: DC
  Administered 2018-02-20 – 2018-02-23 (×8): 12.5 mg via ORAL
  Filled 2018-02-19 (×8): qty 1

## 2018-02-19 MED ORDER — CEFAZOLIN SODIUM-DEXTROSE 2-4 GM/100ML-% IV SOLN
2.0000 g | Freq: Three times a day (TID) | INTRAVENOUS | Status: AC
  Administered 2018-02-19 – 2018-02-21 (×6): 2 g via INTRAVENOUS
  Filled 2018-02-19 (×6): qty 100

## 2018-02-19 MED ORDER — SODIUM CHLORIDE 0.9% FLUSH
3.0000 mL | Freq: Two times a day (BID) | INTRAVENOUS | Status: DC
  Administered 2018-02-20 – 2018-02-26 (×13): 3 mL via INTRAVENOUS

## 2018-02-19 MED ORDER — SODIUM CHLORIDE 0.9 % IV SOLN
INTRAVENOUS | Status: DC | PRN
  Administered 2018-02-19: 1.5 mg/kg/h via INTRAVENOUS
  Administered 2018-02-19: 15 mg/kg/h via INTRAVENOUS

## 2018-02-19 MED ORDER — CHLORHEXIDINE GLUCONATE 0.12 % MT SOLN
15.0000 mL | OROMUCOSAL | Status: AC
  Administered 2018-02-19: 15 mL via OROMUCOSAL

## 2018-02-19 MED ORDER — ORAL CARE MOUTH RINSE
15.0000 mL | Freq: Four times a day (QID) | OROMUCOSAL | Status: DC
  Administered 2018-02-19 – 2018-02-21 (×4): 15 mL via OROMUCOSAL

## 2018-02-19 MED ORDER — PROPOFOL 10 MG/ML IV BOLUS
INTRAVENOUS | Status: DC | PRN
  Administered 2018-02-19: 40 mg via INTRAVENOUS
  Administered 2018-02-19: 80 mg via INTRAVENOUS

## 2018-02-19 MED ORDER — PANTOPRAZOLE SODIUM 40 MG PO TBEC
40.0000 mg | DELAYED_RELEASE_TABLET | Freq: Every day | ORAL | Status: DC
  Administered 2018-02-21: 40 mg via ORAL
  Filled 2018-02-19: qty 1

## 2018-02-19 MED ORDER — SODIUM CHLORIDE 0.9 % IR SOLN
Status: DC | PRN
  Administered 2018-02-19: 6000 mL

## 2018-02-19 MED ORDER — BISACODYL 10 MG RE SUPP: 10.0000 mg | Freq: Every day | RECTAL | Status: DC

## 2018-02-19 MED ORDER — SODIUM CHLORIDE 0.9% FLUSH: 10.0000 mL | INTRAVENOUS | Status: DC | PRN

## 2018-02-19 MED ORDER — FENTANYL CITRATE (PF) 250 MCG/5ML IJ SOLN
INTRAMUSCULAR | Status: DC | PRN
  Administered 2018-02-19: 50 ug via INTRAVENOUS
  Administered 2018-02-19: 150 ug via INTRAVENOUS
  Administered 2018-02-19: 50 ug via INTRAVENOUS
  Administered 2018-02-19: 100 ug via INTRAVENOUS
  Administered 2018-02-19: 300 ug via INTRAVENOUS
  Administered 2018-02-19: 100 ug via INTRAVENOUS
  Administered 2018-02-19: 150 ug via INTRAVENOUS
  Administered 2018-02-19: 100 ug via INTRAVENOUS
  Administered 2018-02-19: 150 ug via INTRAVENOUS
  Administered 2018-02-19: 300 ug via INTRAVENOUS
  Administered 2018-02-19: 50 ug via INTRAVENOUS

## 2018-02-19 MED ORDER — SODIUM CHLORIDE 0.9 % IV SOLN: 250.0000 mL | INTRAVENOUS | Status: DC

## 2018-02-19 MED ORDER — LEVALBUTEROL HCL 1.25 MG/0.5ML IN NEBU
1.2500 mg | INHALATION_SOLUTION | Freq: Four times a day (QID) | RESPIRATORY_TRACT | Status: DC
  Administered 2018-02-19 (×2): 1.25 mg via RESPIRATORY_TRACT
  Filled 2018-02-19: qty 0.5

## 2018-02-19 MED ORDER — SODIUM CHLORIDE 0.9 % IV SOLN
0.0000 ug/kg/h | INTRAVENOUS | Status: DC
  Administered 2018-02-19: 0.6 ug/kg/h via INTRAVENOUS
  Administered 2018-02-19: 0.5 ug/kg/h via INTRAVENOUS
  Filled 2018-02-19 (×2): qty 2

## 2018-02-19 MED ORDER — SODIUM CHLORIDE 0.9% FLUSH: 3.0000 mL | INTRAVENOUS | Status: DC | PRN

## 2018-02-19 MED ORDER — CHLORHEXIDINE GLUCONATE CLOTH 2 % EX PADS
6.0000 | MEDICATED_PAD | Freq: Every day | CUTANEOUS | Status: DC
  Administered 2018-02-19 – 2018-02-20 (×2): 6 via TOPICAL

## 2018-02-19 MED ORDER — SODIUM CHLORIDE 0.9% FLUSH
10.0000 mL | Freq: Two times a day (BID) | INTRAVENOUS | Status: DC
  Administered 2018-02-19: 10 mL
  Administered 2018-02-20: 30 mL
  Administered 2018-02-20: 10 mL

## 2018-02-19 MED ORDER — TRAMADOL HCL 50 MG PO TABS
50.0000 mg | ORAL_TABLET | ORAL | Status: DC | PRN
Start: 2018-02-19 — End: 2018-02-26
  Administered 2018-02-20 – 2018-02-21 (×3): 100 mg via ORAL
  Filled 2018-02-19 (×3): qty 2

## 2018-02-19 MED ORDER — ASPIRIN EC 325 MG PO TBEC
325.0000 mg | DELAYED_RELEASE_TABLET | Freq: Every day | ORAL | Status: DC
  Administered 2018-02-20: 325 mg via ORAL
  Filled 2018-02-19: qty 1

## 2018-02-19 MED ORDER — MORPHINE SULFATE (PF) 4 MG/ML IV SOLN
1.0000 mg | INTRAVENOUS | Status: DC | PRN
  Administered 2018-02-19 (×3): 2 mg via INTRAVENOUS
  Filled 2018-02-19 (×4): qty 1

## 2018-02-19 MED ORDER — ACETAMINOPHEN 160 MG/5ML PO SOLN: 650.0000 mg | Freq: Once | ORAL | Status: AC

## 2018-02-19 MED ORDER — ONDANSETRON HCL 4 MG/2ML IJ SOLN
4.0000 mg | Freq: Four times a day (QID) | INTRAMUSCULAR | Status: DC | PRN
  Administered 2018-02-20 – 2018-02-23 (×7): 4 mg via INTRAVENOUS
  Filled 2018-02-19 (×8): qty 2

## 2018-02-19 MED ORDER — MIDAZOLAM HCL 10 MG/2ML IJ SOLN
INTRAMUSCULAR | Status: AC
  Filled 2018-02-19: qty 2

## 2018-02-19 MED ORDER — PROPOFOL 10 MG/ML IV BOLUS
INTRAVENOUS | Status: AC
  Filled 2018-02-19: qty 20

## 2018-02-19 MED ORDER — SODIUM CHLORIDE 0.9 % IV SOLN
INTRAVENOUS | Status: DC
  Administered 2018-02-19: 16:00:00 via INTRAVENOUS

## 2018-02-19 MED ORDER — CHLORHEXIDINE GLUCONATE 0.12% ORAL RINSE (MEDLINE KIT)
15.0000 mL | Freq: Two times a day (BID) | OROMUCOSAL | Status: DC
  Administered 2018-02-19 – 2018-02-20 (×3): 15 mL via OROMUCOSAL

## 2018-02-19 MED ORDER — ALBUMIN HUMAN 5 % IV SOLN
INTRAVENOUS | Status: DC | PRN
  Administered 2018-02-19 (×3): via INTRAVENOUS

## 2018-02-19 MED ORDER — SODIUM CHLORIDE 0.9 % IJ SOLN
INTRAMUSCULAR | Status: DC | PRN
  Administered 2018-02-19 (×3): 4 mL via TOPICAL

## 2018-02-19 MED ORDER — SODIUM CHLORIDE 0.9 % IV SOLN
INTRAVENOUS | Status: DC
  Filled 2018-02-19: qty 1

## 2018-02-19 MED ORDER — ATORVASTATIN CALCIUM 80 MG PO TABS: 80.0000 mg | ORAL_TABLET | Freq: Every day | ORAL | Status: DC

## 2018-02-19 MED ORDER — HEPARIN SODIUM (PORCINE) 1000 UNIT/ML IJ SOLN
INTRAMUSCULAR | Status: DC | PRN
  Administered 2018-02-19: 26000 [IU] via INTRAVENOUS
  Administered 2018-02-19: 8000 [IU] via INTRAVENOUS

## 2018-02-19 MED ORDER — VANCOMYCIN HCL 1000 MG IV SOLR
INTRAVENOUS | Status: DC | PRN
  Administered 2018-02-19: 1250 mg via INTRAVENOUS

## 2018-02-19 SURGICAL SUPPLY — 135 items
ADAPTER CARDIO PERF ANTE/RETRO (ADAPTER) ×3 IMPLANT
BAG DECANTER FOR FLEXI CONT (MISCELLANEOUS) ×6 IMPLANT
BANDAGE ACE 4X5 VEL STRL LF (GAUZE/BANDAGES/DRESSINGS) ×3 IMPLANT
BANDAGE ACE 6X5 VEL STRL LF (GAUZE/BANDAGES/DRESSINGS) ×3 IMPLANT
BASKET HEART (ORDER IN 25'S) (MISCELLANEOUS) ×1
BASKET HEART (ORDER IN 25S) (MISCELLANEOUS) ×2 IMPLANT
BLADE CLIPPER SURG (BLADE) IMPLANT
BLADE STERNUM SYSTEM 6 (BLADE) ×3 IMPLANT
BLADE SURG 11 STRL SS (BLADE) ×3 IMPLANT
BNDG GAUZE ELAST 4 BULKY (GAUZE/BANDAGES/DRESSINGS) ×3 IMPLANT
CANISTER SUCT 3000ML PPV (MISCELLANEOUS) ×3 IMPLANT
CANNULA EZ GLIDE AORTIC 21FR (CANNULA) ×6 IMPLANT
CANNULA GUNDRY RCSP 15FR (MISCELLANEOUS) ×3 IMPLANT
CANNULA SOFTFLOW AORTIC 7M21FR (CANNULA) ×3 IMPLANT
CATH CPB KIT OWEN (MISCELLANEOUS) ×3 IMPLANT
CATH HEART VENT LEFT (CATHETERS) ×2 IMPLANT
CATH THORACIC 36FR (CATHETERS) ×3 IMPLANT
CATH THORACIC 36FR RT ANG (CATHETERS) ×3 IMPLANT
CLIP RETRACTION 3.0MM CORONARY (MISCELLANEOUS) ×3 IMPLANT
CLIP VESOCCLUDE MED 24/CT (CLIP) IMPLANT
CLIP VESOCCLUDE SM WIDE 24/CT (CLIP) IMPLANT
CONN ST 1/4X3/8  BEN (MISCELLANEOUS) ×1
CONN ST 1/4X3/8 BEN (MISCELLANEOUS) ×2 IMPLANT
CONT SPECI 4OZ STER CLIK (MISCELLANEOUS) ×3 IMPLANT
COVER SURGICAL LIGHT HANDLE (MISCELLANEOUS) ×3 IMPLANT
CRADLE DONUT ADULT HEAD (MISCELLANEOUS) ×3 IMPLANT
DERMABOND ADVANCED (GAUZE/BANDAGES/DRESSINGS) ×1
DERMABOND ADVANCED .7 DNX12 (GAUZE/BANDAGES/DRESSINGS) ×2 IMPLANT
DEVICE SUT CK QUICK LOAD INDV (Prosthesis & Implant Heart) ×3 IMPLANT
DEVICE SUT CK QUICK LOAD MINI (Prosthesis & Implant Heart) ×3 IMPLANT
DRAIN CHANNEL 32F RND 10.7 FF (WOUND CARE) ×6 IMPLANT
DRAPE BILATERAL SPLIT (DRAPES) IMPLANT
DRAPE CARDIOVASCULAR INCISE (DRAPES) ×1
DRAPE CV SPLIT W-CLR ANES SCRN (DRAPES) IMPLANT
DRAPE INCISE IOBAN 66X45 STRL (DRAPES) ×6 IMPLANT
DRAPE SLUSH/WARMER DISC (DRAPES) ×3 IMPLANT
DRAPE SRG 135X102X78XABS (DRAPES) ×2 IMPLANT
DRSG AQUACEL AG ADV 3.5X14 (GAUZE/BANDAGES/DRESSINGS) ×3 IMPLANT
DRSG COVADERM 4X14 (GAUZE/BANDAGES/DRESSINGS) ×3 IMPLANT
ELECT BLADE 4.0 EZ CLEAN MEGAD (MISCELLANEOUS) ×3
ELECT REM PT RETURN 9FT ADLT (ELECTROSURGICAL) ×6
ELECTRODE BLDE 4.0 EZ CLN MEGD (MISCELLANEOUS) ×2 IMPLANT
ELECTRODE REM PT RTRN 9FT ADLT (ELECTROSURGICAL) ×4 IMPLANT
FELT TEFLON 1X6 (MISCELLANEOUS) ×9 IMPLANT
GAUZE SPONGE 4X4 12PLY STRL (GAUZE/BANDAGES/DRESSINGS) ×6 IMPLANT
GLOVE BIO SURGEON STRL SZ 6 (GLOVE) IMPLANT
GLOVE BIO SURGEON STRL SZ 6.5 (GLOVE) ×9 IMPLANT
GLOVE BIO SURGEON STRL SZ7 (GLOVE) IMPLANT
GLOVE BIO SURGEON STRL SZ7.5 (GLOVE) ×6 IMPLANT
GLOVE BIO SURGEON STRL SZ8 (GLOVE) ×3 IMPLANT
GLOVE BIO SURGEON STRL SZ8.5 (GLOVE) ×12 IMPLANT
GLOVE BIOGEL PI IND STRL 8.5 (GLOVE) ×10 IMPLANT
GLOVE BIOGEL PI INDICATOR 8.5 (GLOVE) ×5
GLOVE ECLIPSE 8.0 STRL XLNG CF (GLOVE) ×18 IMPLANT
GLOVE ORTHO TXT STRL SZ7.5 (GLOVE) ×9 IMPLANT
GOWN STRL REUS W/ TWL LRG LVL3 (GOWN DISPOSABLE) ×18 IMPLANT
GOWN STRL REUS W/TWL 2XL LVL3 (GOWN DISPOSABLE) ×12 IMPLANT
GOWN STRL REUS W/TWL LRG LVL3 (GOWN DISPOSABLE) ×9
HEMOSTAT POWDER SURGIFOAM 1G (HEMOSTASIS) ×9 IMPLANT
INSERT FOGARTY XLG (MISCELLANEOUS) ×3 IMPLANT
KIT BASIN OR (CUSTOM PROCEDURE TRAY) ×3 IMPLANT
KIT ROOM TURNOVER OR (KITS) ×3 IMPLANT
KIT SUCTION CATH 14FR (SUCTIONS) ×9 IMPLANT
KIT SUT CK MINI COMBO 4X17 (Prosthesis & Implant Heart) ×3 IMPLANT
KIT VASOVIEW HEMOPRO VH 3000 (KITS) ×3 IMPLANT
LEAD PACING MYOCARDI (MISCELLANEOUS) ×3 IMPLANT
LINE VENT (MISCELLANEOUS) ×3 IMPLANT
MARKER GRAFT CORONARY BYPASS (MISCELLANEOUS) ×9 IMPLANT
NS IRRIG 1000ML POUR BTL (IV SOLUTION) ×18 IMPLANT
PACK E OPEN HEART (SUTURE) ×3 IMPLANT
PACK OPEN HEART (CUSTOM PROCEDURE TRAY) ×3 IMPLANT
PAD ARMBOARD 7.5X6 YLW CONV (MISCELLANEOUS) ×6 IMPLANT
PAD ELECT DEFIB RADIOL ZOLL (MISCELLANEOUS) ×3 IMPLANT
PENCIL BUTTON HOLSTER BLD 10FT (ELECTRODE) ×3 IMPLANT
PUNCH AORTIC ROTATE  4.5MM 8IN (MISCELLANEOUS) ×3 IMPLANT
PUNCH AORTIC ROTATE 4.0MM (MISCELLANEOUS) IMPLANT
PUNCH AORTIC ROTATE 4.5MM 8IN (MISCELLANEOUS) IMPLANT
PUNCH AORTIC ROTATE 5MM 8IN (MISCELLANEOUS) IMPLANT
SET CARDIOPLEGIA MPS 5001102 (MISCELLANEOUS) ×3 IMPLANT
SET IRRIG TUBING LAPAROSCOPIC (IRRIGATION / IRRIGATOR) IMPLANT
SOLUTION ANTI FOG 6CC (MISCELLANEOUS) ×3 IMPLANT
SPONGE LAP 18X18 X RAY DECT (DISPOSABLE) IMPLANT
SPONGE LAP 4X18 X RAY DECT (DISPOSABLE) IMPLANT
SUT BONE WAX W31G (SUTURE) ×3 IMPLANT
SUT ETHIBON 2 0 V 52N 30 (SUTURE) ×12 IMPLANT
SUT ETHIBON EXCEL 2-0 V-5 (SUTURE) ×3 IMPLANT
SUT ETHIBOND 2 0 SH (SUTURE)
SUT ETHIBOND 2 0 SH 36X2 (SUTURE) IMPLANT
SUT ETHIBOND 2 0 V4 (SUTURE) IMPLANT
SUT ETHIBOND 2 0V4 GREEN (SUTURE) IMPLANT
SUT ETHIBOND 4 0 RB 1 (SUTURE) IMPLANT
SUT ETHIBOND V-5 VALVE (SUTURE) ×3 IMPLANT
SUT ETHIBOND X763 2 0 SH 1 (SUTURE) ×12 IMPLANT
SUT MNCRL AB 3-0 PS2 18 (SUTURE) ×6 IMPLANT
SUT MNCRL AB 4-0 PS2 18 (SUTURE) IMPLANT
SUT PDS AB 1 CTX 36 (SUTURE) ×6 IMPLANT
SUT PROLENE 2 0 SH DA (SUTURE) IMPLANT
SUT PROLENE 3 0 SH DA (SUTURE) ×3 IMPLANT
SUT PROLENE 3 0 SH1 36 (SUTURE) ×15 IMPLANT
SUT PROLENE 4 0 RB 1 (SUTURE) ×8
SUT PROLENE 4 0 SH DA (SUTURE) ×6 IMPLANT
SUT PROLENE 4-0 RB1 .5 CRCL 36 (SUTURE) ×16 IMPLANT
SUT PROLENE 5 0 C 1 36 (SUTURE) IMPLANT
SUT PROLENE 6 0 C 1 30 (SUTURE) ×6 IMPLANT
SUT PROLENE 7.0 RB 3 (SUTURE) ×12 IMPLANT
SUT PROLENE 8 0 BV175 6 (SUTURE) IMPLANT
SUT PROLENE BLUE 7 0 (SUTURE) ×6 IMPLANT
SUT PROLENE POLY MONO (SUTURE) IMPLANT
SUT SILK  1 MH (SUTURE) ×1
SUT SILK 1 MH (SUTURE) ×2 IMPLANT
SUT SILK 2 0 SH CR/8 (SUTURE) IMPLANT
SUT SILK 3 0 SH CR/8 (SUTURE) IMPLANT
SUT STEEL 6MS V (SUTURE) ×3 IMPLANT
SUT STEEL STERNAL CCS#1 18IN (SUTURE) IMPLANT
SUT STEEL SZ 6 DBL 3X14 BALL (SUTURE) ×6 IMPLANT
SUT VIC AB 1 CTX 36 (SUTURE) ×1
SUT VIC AB 1 CTX36XBRD ANBCTR (SUTURE) ×2 IMPLANT
SUT VIC AB 2-0 CT1 27 (SUTURE) ×1
SUT VIC AB 2-0 CT1 TAPERPNT 27 (SUTURE) ×2 IMPLANT
SUT VIC AB 2-0 CTX 27 (SUTURE) IMPLANT
SUT VIC AB 3-0 SH 27 (SUTURE)
SUT VIC AB 3-0 SH 27X BRD (SUTURE) IMPLANT
SUT VIC AB 3-0 X1 27 (SUTURE) IMPLANT
SUT VICRYL 4-0 PS2 18IN ABS (SUTURE) IMPLANT
SYSTEM SAHARA CHEST DRAIN ATS (WOUND CARE) ×3 IMPLANT
TAPE CLOTH SURG 4X10 WHT LF (GAUZE/BANDAGES/DRESSINGS) ×3 IMPLANT
TAPE PAPER 2X10 WHT MICROPORE (GAUZE/BANDAGES/DRESSINGS) ×3 IMPLANT
TOWEL GREEN STERILE (TOWEL DISPOSABLE) ×3 IMPLANT
TOWEL GREEN STERILE FF (TOWEL DISPOSABLE) ×3 IMPLANT
TRAY FOLEY SILVER 16FR TEMP (SET/KITS/TRAYS/PACK) ×3 IMPLANT
TUBING INSUFFLATION (TUBING) ×3 IMPLANT
UNDERPAD 30X30 (UNDERPADS AND DIAPERS) ×3 IMPLANT
VALVE AORTIC SZ25 INSP/RESIL (Prosthesis & Implant Heart) ×3 IMPLANT
VENT LEFT HEART 12002 (CATHETERS) ×3
WATER STERILE IRR 1000ML POUR (IV SOLUTION) ×6 IMPLANT

## 2018-02-19 NOTE — Anesthesia Procedure Notes (Signed)
Arterial Line Insertion Start/End3/05/2018 7:49 AM, 02/19/2018 7:49 AM Performed by: CRNA  Patient location: Pre-op. Preanesthetic checklist: patient identified, IV checked, site marked, risks and benefits discussed, surgical consent, monitors and equipment checked, pre-op evaluation, timeout performed and anesthesia consent Lidocaine 1% used for infiltration Left, radial was placed Catheter size: 20 Fr Hand hygiene performed  and maximum sterile barriers used   Attempts: 1 Procedure performed without using ultrasound guided technique. Following insertion, dressing applied. Post procedure assessment: normal and unchanged

## 2018-02-19 NOTE — Progress Notes (Signed)
RT note-ventilator changes post ABG. 

## 2018-02-19 NOTE — Procedures (Deleted)
Extubation Procedure Note  Patient Details:   Name: Jhonen Kosiba DOB: 09/22/1946 MRN: [id number]   Airway Documentation:     Evaluation  O2 sats: stable throughout Complications: No apparent complications Patient did tolerate procedure well. Bilateral Breath Sounds: Clear, Diminished   Yes Placed on 4L [place]. IS instructed pt achieve 1.7-2.0L  Newt Lukes 02/19/2018, 5:48 PM

## 2018-02-19 NOTE — Anesthesia Preprocedure Evaluation (Addendum)
Anesthesia Evaluation  Patient identified by MRN, date of birth, ID band Patient awake    Reviewed: Allergy & Precautions, NPO status , Patient's Chart, lab work & pertinent test results  Airway Mallampati: II  TM Distance: >3 FB Neck ROM: Full    Dental  (+) Edentulous Upper, Edentulous Lower   Pulmonary former smoker,    breath sounds clear to auscultation       Cardiovascular hypertension,  Rhythm:Regular Rate:Normal + Systolic murmurs    Neuro/Psych    GI/Hepatic   Endo/Other  diabetes  Renal/GU      Musculoskeletal   Abdominal   Peds  Hematology   Anesthesia Other Findings   Reproductive/Obstetrics                             Anesthesia Physical Anesthesia Plan  ASA: III  Anesthesia Plan: General   Post-op Pain Management:    Induction: Intravenous  PONV Risk Score and Plan: Ondansetron  Airway Management Planned: Oral ETT  Additional Equipment: Arterial line, CVP, PA Cath, 3D TEE and Ultrasound Guidance Line Placement  Intra-op Plan:   Post-operative Plan: Post-operative intubation/ventilation  Informed Consent: I have reviewed the patients History and Physical, chart, labs and discussed the procedure including the risks, benefits and alternatives for the proposed anesthesia with the patient or authorized representative who has indicated his/her understanding and acceptance.     Plan Discussed with: CRNA and Anesthesiologist  Anesthesia Plan Comments:         Anesthesia Quick Evaluation

## 2018-02-19 NOTE — Transfer of Care (Signed)
Immediate Anesthesia Transfer of Care Note  Patient: Harold Parker  Procedure(s) Performed: CORONARY ARTERY BYPASS GRAFTING (CABG) x three , using left internal mammary artery and right leg greater saphenous vein harvested endoscopically (N/A Chest) AORTIC VALVE REPLACEMENT (AVR) (N/A Chest) TRANSESOPHAGEAL ECHOCARDIOGRAM (TEE) (N/A )  Patient Location: SICU  Anesthesia Type:General  Level of Consciousness: Patient remains intubated per anesthesia plan  Airway & Oxygen Therapy: Patient remains intubated per anesthesia plan  Post-op Assessment: Report given to RN and Post -op Vital signs reviewed and stable  Post vital signs: Reviewed and stable  Last Vitals:  Vitals:   02/18/18 2148 02/19/18 0611  BP: 131/73 115/64  Pulse: 92 86  Resp:    Temp:  36.7 C  SpO2:  96%    Last Pain:  Vitals:   02/19/18 0611  TempSrc: Oral  PainSc:       Patients Stated Pain Goal: 0 (02/17/18 1928)  Complications: No apparent anesthesia complications

## 2018-02-19 NOTE — Progress Notes (Signed)
Visited with this patient and nurse.   Was  Informed that this patient hasn't missed an Ash Wednesday Service.  Completed service with the patient with ashes on forehead.  Happy to take part in this special service for this patient.  Will continue to provide support to patient and family.      02/19/18 1748  Clinical Encounter Type  Visited With Patient;Health care provider  Visit Type Initial;Follow-up;Spiritual support  Spiritual Encounters  Spiritual Needs Ritual

## 2018-02-19 NOTE — Anesthesia Procedure Notes (Signed)
Central Venous Catheter Insertion Performed by: Lewie Loron, MD, anesthesiologist Start/End3/05/2018 7:40 AM, 02/19/2018 7:55 AM Patient location: Pre-op. Preanesthetic checklist: patient identified, IV checked, site marked, risks and benefits discussed, surgical consent, monitors and equipment checked, pre-op evaluation, timeout performed and anesthesia consent Position: Trendelenburg Lidocaine 1% used for infiltration and patient sedated Hand hygiene performed , maximum sterile barriers used  and Seldinger technique used Catheter size: 9 Fr Central line was placed.Sheath introducer Procedure performed using ultrasound guided technique. Ultrasound Notes:anatomy identified, needle tip was noted to be adjacent to the nerve/plexus identified, no ultrasound evidence of intravascular and/or intraneural injection and image(s) printed for medical record Attempts: 1 Following insertion, line sutured, dressing applied and Biopatch. Post procedure assessment: blood return through all ports, free fluid flow and no air  Patient tolerated the procedure well with no immediate complications.

## 2018-02-19 NOTE — OR Nursing (Signed)
14:47 - 20 minute call to SICU nurse

## 2018-02-19 NOTE — Progress Notes (Signed)
      [address]       [name] [address]             [phone]     CARDIOTHORACIC SURGERY PROGRESS NOTE  Subjective: Harold Parker has been scheduled for Procedure(s): CORONARY ARTERY BYPASS GRAFTING (CABG) (N/A) AORTIC VALVE REPLACEMENT (AVR) (N/A) TRANSESOPHAGEAL ECHOCARDIOGRAM (TEE) (N/A) today.   Objective: Vital signs in last 24 hours: Temp:  [98.3 F (36.8 C)-99.2 F (37.3 C)] 98.3 F (36.8 C) (03/05 2104) Pulse Rate:  [86-98] 86 (03/06 1517) Cardiac Rhythm: Normal sinus rhythm;Bundle branch block (03/05 2153) BP: (115-131)/(63-94) 115/64 (03/06 0611) SpO2:  [95 %] 95 % (03/05 1434) Weight:  [160 lb 1.6 oz (72.6 kg)] 160 lb 1.6 oz (72.6 kg) (03/06 0500)  Physical Exam: Unchanged from previously   Intake/Output from previous day: 03/05 0701 - 03/06 0700 In: 3 [I.V.:3] Out: -  Intake/Output this shift: Total I/O In: 3 [I.V.:3] Out: -   Lab Results: Recent Labs    02/17/18 0337 02/18/18 0402  WBC 12.8* 13.1*  HGB 12.5* 12.2*  HCT 37.2* 36.5*  PLT 301 307   BMET:  Recent Labs    02/18/18 0402  NA 140  K 3.8  CL 105  CO2 26  GLUCOSE 213*  BUN 30*  CREATININE 1.01  CALCIUM 9.2    CBG (last 3)  Recent Labs    02/18/18 1108 02/18/18 1639 02/18/18 2149  GLUCAP 167* 276* 164*   PT/INR:  No results for input(s): LABPROT, INR in the last 72 hours.  Assessment/Plan:   The various methods of treatment have been discussed with the patient. After consideration of the risks, benefits and treatment options the patient has consented to the planned procedure.   The patient has been seen and labs reviewed. There are no changes in the patient's condition to prevent proceeding with the planned procedure today.   Purcell Nails, MD 02/19/2018 6:29 AM

## 2018-02-19 NOTE — Anesthesia Postprocedure Evaluation (Signed)
Anesthesia Post Note  Patient: Harold Parker  Procedure(s) Performed: CORONARY ARTERY BYPASS GRAFTING (CABG) x three , using left internal mammary artery and right leg greater saphenous vein harvested endoscopically (N/A Chest) AORTIC VALVE REPLACEMENT (AVR) (N/A Chest) TRANSESOPHAGEAL ECHOCARDIOGRAM (TEE) (N/A )     Patient location during evaluation: SICU Anesthesia Type: General Level of consciousness: sedated and patient remains intubated per anesthesia plan Pain management: pain level controlled Vital Signs Assessment: post-procedure vital signs reviewed and stable Respiratory status: patient remains intubated per anesthesia plan and patient on ventilator - see flowsheet for VS Cardiovascular status: stable and blood pressure returned to baseline Anesthetic complications: no    Last Vitals:  Vitals:   02/19/18 1830 02/19/18 1839  BP:    Pulse: 78   Resp: (!) 24   Temp: 37 C   SpO2: 94% 100%    Last Pain:  Vitals:   02/19/18 1545  TempSrc: Core  PainSc:                  Harold Parker

## 2018-02-19 NOTE — Anesthesia Procedure Notes (Signed)
Central Venous Catheter Insertion Performed by: Lewie Loron, MD, anesthesiologist Start/End3/05/2018 7:52 AM, 02/19/2018 7:55 AM Patient location: Pre-op. Preanesthetic checklist: patient identified, IV checked, site marked, risks and benefits discussed, surgical consent, monitors and equipment checked, pre-op evaluation, timeout performed and anesthesia consent Hand hygiene performed  and maximum sterile barriers used  PA cath was placed.Swan type:thermodilution PA Cath depth:43 Procedure performed without using ultrasound guided technique. Attempts: 1 Patient tolerated the procedure well with no immediate complications.

## 2018-02-19 NOTE — Progress Notes (Signed)
  Echocardiogram Echocardiogram Transesophageal has been performed.  Harold Parker M 02/19/2018, 9:25 AM 

## 2018-02-19 NOTE — OR Nursing (Signed)
14:10 - 45 minute call to SICU charge nurse 

## 2018-02-19 NOTE — Progress Notes (Signed)
CT surgery p.m. Rounds  Remains intubated with CO2 retention Patient has chronic lung disease from smoking and more recently acute viral respiratory infection We will add Xopenex for scattered rhonchi We will not extubate unless patient safely exceeds parameters

## 2018-02-19 NOTE — Brief Op Note (Signed)
02/07/2018 - 02/19/2018  3:11 PM  PATIENT:  Harold Parker  72 y.o. male  PRE-OPERATIVE DIAGNOSIS:  CAD AS  POST-OPERATIVE DIAGNOSIS:  CAD AS  PROCEDURE:  Procedure(s): CORONARY ARTERY BYPASS GRAFTING (CABG) x three , using left internal mammary artery and right leg greater saphenous vein harvested endoscopically (N/A) AORTIC VALVE REPLACEMENT (AVR) (N/A) TRANSESOPHAGEAL ECHOCARDIOGRAM (TEE) (N/A)  SURGEON:  Surgeon(s) and Role:    Purcell Nails, MD - Primary  PHYSICIAN ASSISTANT: WAYNE GOLD PA-C  ANESTHESIA:   general  EBL:  500 mL   BLOOD ADMINISTERED:none  DRAINS: PLEURAL AND MEDIASTINAL CHEST DRAINS   LOCAL MEDICATIONS USED:  NONE  SPECIMEN:  Source of Specimen:  AORTIC VALVE LEAFLETS  DISPOSITION OF SPECIMEN:  PATHOLOGY  COUNTS:  YES  TOURNIQUET:  * No tourniquets in log *  DICTATION: .Dragon Dictation  PLAN OF CARE: Admit to inpatient   PATIENT DISPOSITION:  ICU - intubated and hemodynamically stable.   Delay start of Pharmacological VTE agent (>24hrs) due to surgical blood loss or risk of bleeding: yes

## 2018-02-19 NOTE — Progress Notes (Addendum)
Per Dr. Cornelius Moras - Increase rate on vent to 16 and then obtain ABG in 45 minutes BEFORE beginning to rapid wean. RRT notified.   Leanna Battles, RN

## 2018-02-19 NOTE — Op Note (Signed)
CARDIOTHORACIC SURGERY OPERATIVE NOTE  Date of Procedure:  02/19/2018  Preoperative Diagnosis:   Moderate Aortic Stenosis  Severe Left Main and 3-vessel Coronary Artery Disease  Postoperative Diagnosis: Same  Procedure:    Aortic Valve Replacement  Edwards Inspiris Resilia Stented Bovine Pericardial Tissue Valve (size 25mm, ref # [id number], serial # [id number])   Coronary Artery Bypass Grafting x 3  Left Internal Mammary Artery to Distal Left Anterior Descending Coronary Artery  Saphenous Vein Graft to First Diagonal Branch Coronary Artery  Saphenous Vein Graft to Obtuse Marginal Branch of Left Circumflex Coronary Artery  Endoscopic Vein Harvest from Right Thigh and Lower Leg  Surgeon: Salvatore Decent. Cornelius Moras, MD  Assistant: Rowe Clack, PA-C  Anesthesia: Kipp Brood, MD  Operative Findings:  Moderate aortic stenosis  Mild left ventricular systolic dysfunction  Good quality left internal mammary artery conduit  Good quality saphenous vein conduit  Good quality target vessels for grafting          BRIEF CLINICAL NOTE AND INDICATIONS FOR SURGERY  Patient is a 72 year old male with known history of coronary artery disease, hypertension, hyperlipidemia, type 2 diabetes mellitus, and COPD with ongoing tobacco abuse and chronic bronchitis who describes a 10-month history of symptoms of exertional shortness of breath and epigastric chest pain attributed to reflux followed by somewhat acute exacerbation of severe resting shortness of breath in the setting of low-grade fever, productive cough, and flulike syndrome associated with positive titers for influenza A.  The patient presented with acute hypoxemia and chest x-ray with pulmonary edema, consistent with acute combined systolic and diastolic congestive heart failure.  He ruled in for an acute non-ST segment elevation myocardial infarction with peak troponin I reaching 2.65.  Symptoms have improved with Tamiflu, steroids, and  intravenous diuresis.  The patient denies any symptoms of chest pain or chest tightness since hospital admission.  Echocardiogram reveals moderate left ventricular systolic dysfunction with ejection fraction estimated 40-45%.  There was severe hypokinesis of the mid anterior and anteroapical septum.  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  The aortic valve leaflets are thickened, partially calcified, and demonstrate severely restricted leaflet mobility.  Peak velocity across the aortic valve measured a size 2.85 m/s corresponding to mean transvalvular gradient estimated 20 mmHg and aortic valve area calculated 1.06 cm.  The DVI was reported 0.33.  There was mild mitral regurgitation.  There were findings suggestive of increased pulmonary artery pressures.  Diagnostic cardiac catheterization revealed severe left main and three-vessel coronary artery disease.  There is 85% stenosis of the left main coronary artery.  There is diffuse long segment 50% stenosis of the left anterior descending coronary artery.  There is long segment 40% in-stent restenosis of the mid left anterior descending coronary artery.  There is 95% stenosis of the first obtuse marginal branch of the left circumflex coronary artery.  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  Terminal branches of the distal right coronary artery are not well visualized, but the patient appears to have codominant coronary circulation.  Peak to peak transvalvular gradient across the aortic valve was measured 20 mmHg.  Left ventricular end-diastolic pressure was severely elevated.  Right heart catheterization was not performed for unclear reasons.  The patient was transferred to [facility] for cardiothoracic surgical consultation and further management.  During his preoperative stay he was treated for bronchitis and influenza.  He was seen in consultation by the pulmonary medicine team  because of  the presence of severe COPD.  He was felt to be at very high risk for surgical intervention and considerable medical tuneup was warranted.  Ultimately, the patient improved to the point where elective surgical intervention was felt to be associated with a reasonable level of risk.  The patient has been seen in consultation and counseled at length regarding the indications, risks and potential benefits of surgery.  All questions have been answered, and the patient provides full informed consent for the operation as described.    DETAILS OF THE OPERATIVE PROCEDURE  Preparation:  The patient is brought to the operating room on the above mentioned date and central monitoring was established by the anesthesia team including placement of Swan-Ganz catheter and radial arterial line. The patient is placed in the supine position on the operating table.  Intravenous antibiotics are administered. General endotracheal anesthesia is induced uneventfully. A Foley catheter is placed.  Baseline transesophageal echocardiogram was performed.  Findings were notable for mild to moderate left ventricular systolic dysfunction with ejection fraction estimated 40-45%.  There was severe hypokinesis or akinesis of the distal anterior wall and apex.  There was moderate aortic stenosis.  The aortic valve was trileaflet.  There was severe thickening, calcification, and restricted leaflet mobility involving all 3 leaflets of the aortic valve.  There was trivial aortic insufficiency.  There was trivial mitral regurgitation.  The patient's chest, abdomen, both groins, and both lower extremities are prepared and draped in a sterile manner. A time out procedure is performed.   Surgical Approach and Conduit Harvest:  A median sternotomy incision was performed and the left internal mammary artery is dissected from the chest wall and prepared for bypass grafting. The left internal mammary artery is notably good quality  conduit. Simultaneously, saphenous vein is obtained from the patient's right thigh and leg using endoscopic vein harvest technique. The saphenous vein is notably good quality conduit. After removal of the saphenous vein, the small surgical incisions in the lower extremity are closed with absorbable suture. Following systemic heparinization, the left internal mammary artery was transected distally noted to have excellent flow.   Extracorporeal Cardiopulmonary Bypass and Myocardial Protection:  The pericardium is opened. The ascending aorta is normal in appearance. The ascending aorta and the right atrium are cannulated for cardioplegia bypass.  Adequate heparinization is verified.    A retrograde cardioplegia cannula is placed through the right atrium into the coronary sinus.  The operative field was continuously flooded with carbon dioxide gas.  The entire pre-bypass portion of the operation was notable for stable hemodynamics.  Cardiopulmonary bypass was begun and a left ventricular vent placed through the right superior pulmonary vein.  The surface of the heart inspected. Distal target vessels are selected for coronary artery bypass grafting.  The terminal branches of the distal right coronary artery were too small and diffusely diseased for grafting.  A cardioplegia cannula is placed in the ascending aorta.  A temperature probe was placed in the interventricular septum.  The patient is cooled to 28C systemic temperature.  The aortic cross clamp is applied and cardioplegia is delivered initially in an antegrade fashion through the aortic root using modified [place] cold blood cardioplegia ([place] blood cardioplegia protocol).   The initial cardioplegic arrest is rapid with early diastolic arrest.  Repeat doses of cardioplegia are administered at 90 minutes and every 30 minutes thereafter through the coronary sinus catheter and through subsequently placed vein grafts  in order to maintain  completely flat electrocardiogram.  Myocardial protection was felt to be excellent.   Coronary Artery Bypass Grafting:   The first diagonal branch of the left anterior descending coronary artery was grafted using a reversed saphenous vein graft in an end-to-side fashion.  At the site of distal anastomosis the target vessel was good quality and measured approximately 1.7 mm in diameter.  The obtuse marginal branch of the left circumflex coronary artery was grafted using a reversed saphenous vein graft in an end-to-side fashion.  At the site of distal anastomosis the target vessel was intramyocardial but good quality and measured approximately 2.0 mm in diameter.  The distal left anterior coronary artery was grafted with the left internal mammary artery in an end-to-side fashion.  At the site of distal anastomosis the target vessel was good quality and measured approximately 2.0 mm in diameter.   Aortic Valve Replacement:  An oblique transverse aortotomy incision was performed.  There was significant calcification in the aortic wall and the aortic root.  There was a napkin ring area of calcification near the sinotubular junction.  The aortotomy incision was taken across the sinotubular junction into the noncoronary sinus of Valsalva.  The aortic valve was inspected and notable for moderate to severe aortic stenosis.  The aortic valve leaflets were excised sharply and the aortic annulus decalcified.  Decalcification was notably straightforward.  The aortic annulus was sized to accept a 25 mm prosthesis.  The aortic root and left ventricle were irrigated with copious cold saline solution.  Aortic valve replacement was performed using interrupted horizontal mattress 2-0 Ethibond pledgeted sutures with pledgets in the subannular position.  An Edwards Inspiris Resilia stented bovine pericardial tissue valve (size 25 mm, ref # [id number], serial # [id number]) was implanted uneventfully. The valve seated  appropriately with adequate space beneath the left main and right coronary artery.  The aortotomy was closed using a 2-layer closure of running 3-0 Prolene suture with Teflon felt strips to buttress the suture line.   Procedure Completion:  All proximal vein graft anastomoses were placed directly to the ascending aorta prior to removal of the aortic cross clamp.  The septal myocardial temperature rose rapidly after reperfusion of the left internal mammary artery graft.  One final dose of warm retrograde "reanimation dose" cardioplegia was administered through the coronary sinus catheter while all air was evacuated through the aortic root.  The aortic cross clamp was removed after a total cross clamp time of 147 minutes.  All proximal and distal coronary anastomoses were inspected for hemostasis and appropriate graft orientation. Epicardial pacing wires are fixed to the right ventricular outflow tract and to the right atrial appendage. The patient is rewarmed to 37C temperature. The aortic and left ventricular vents were removed.  The patient is weaned and disconnected from cardiopulmonary bypass.  The patient's rhythm at separation from bypass was sinus.  The patient was weaned from cardiopulmonary bypass without any inotropic support. Total cardiopulmonary bypass time for the operation was 173 minutes.  Followup transesophageal echocardiogram performed after separation from bypass revealed a well-seated bioprosthetic tissue valve in the aortic position that was functioning normally.  There was no perivalvular leak.  There were otherwise no changes from the preoperative exam.  The aortic and venous cannula were removed uneventfully. Protamine was administered to reverse the anticoagulation. The mediastinum and pleural space were inspected for hemostasis and irrigated with saline solution. The mediastinum and the left pleural space were drained using 3 chest tubes placed through separate stab incisions  inferiorly.  The soft  tissues anterior to the aorta were reapproximated loosely. The sternum is closed with double strength sternal wire. The soft tissues anterior to the sternum were closed in multiple layers and the skin is closed with a running subcuticular skin closure.  The post-bypass portion of the operation was notable for stable rhythm and hemodynamics.  No blood products were administered during the operation.   Disposition:  The patient tolerated the procedure well and is transported to the surgical intensive care in stable condition. There are no intraoperative complications. All sponge instrument and needle counts are verified correct at completion of the operation.   Salvatore Decent. Cornelius Moras MD 02/19/2018 3:05 PM

## 2018-02-19 NOTE — Anesthesia Procedure Notes (Signed)
Procedure Name: Intubation Date/Time: 02/19/2018 8:47 AM Performed by: Clearnce Sorrel, CRNA Pre-anesthesia Checklist: Patient identified, Emergency Drugs available, Suction available, Patient being monitored and Timeout performed Patient Re-evaluated:Patient Re-evaluated prior to induction Oxygen Delivery Method: Circle system utilized Preoxygenation: Pre-oxygenation with 100% oxygen Induction Type: IV induction Ventilation: Mask ventilation without difficulty, Oral airway inserted - appropriate to patient size and Two handed mask ventilation required Laryngoscope Size: Mac and 3 Grade View: Grade I Tube type: Subglottic suction tube Tube size: 8.0 mm Number of attempts: 1 Airway Equipment and Method: Stylet Placement Confirmation: ETT inserted through vocal cords under direct vision,  positive ETCO2 and breath sounds checked- equal and bilateral Secured at: 23 cm Tube secured with: Tape Dental Injury: Teeth and Oropharynx as per pre-operative assessment

## 2018-02-20 ENCOUNTER — Encounter (HOSPITAL_COMMUNITY): Payer: Self-pay | Admitting: Thoracic Surgery (Cardiothoracic Vascular Surgery)

## 2018-02-20 ENCOUNTER — Inpatient Hospital Stay (HOSPITAL_COMMUNITY): Payer: Medicare Other

## 2018-02-20 DIAGNOSIS — Z953 Presence of xenogenic heart valve: Secondary | ICD-10-CM

## 2018-02-20 LAB — GLUCOSE, CAPILLARY
Glucose-Capillary: 112 mg/dL — ABNORMAL HIGH (ref 65–99)
Glucose-Capillary: 113 mg/dL — ABNORMAL HIGH (ref 65–99)
Glucose-Capillary: 115 mg/dL — ABNORMAL HIGH (ref 65–99)
Glucose-Capillary: 116 mg/dL — ABNORMAL HIGH (ref 65–99)
Glucose-Capillary: 119 mg/dL — ABNORMAL HIGH (ref 65–99)
Glucose-Capillary: 119 mg/dL — ABNORMAL HIGH (ref 65–99)
Glucose-Capillary: 123 mg/dL — ABNORMAL HIGH (ref 65–99)
Glucose-Capillary: 126 mg/dL — ABNORMAL HIGH (ref 65–99)
Glucose-Capillary: 126 mg/dL — ABNORMAL HIGH (ref 65–99)
Glucose-Capillary: 127 mg/dL — ABNORMAL HIGH (ref 65–99)
Glucose-Capillary: 139 mg/dL — ABNORMAL HIGH (ref 65–99)
Glucose-Capillary: 142 mg/dL — ABNORMAL HIGH (ref 65–99)
Glucose-Capillary: 142 mg/dL — ABNORMAL HIGH (ref 65–99)
Glucose-Capillary: 155 mg/dL — ABNORMAL HIGH (ref 65–99)
Glucose-Capillary: 155 mg/dL — ABNORMAL HIGH (ref 65–99)
Glucose-Capillary: 167 mg/dL — ABNORMAL HIGH (ref 65–99)
Glucose-Capillary: 94 mg/dL (ref 65–99)

## 2018-02-20 LAB — CBC
HCT: 31.1 % — ABNORMAL LOW (ref 39.0–52.0)
HCT: 34.9 % — ABNORMAL LOW (ref 39.0–52.0)
Hemoglobin: 10.4 g/dL — ABNORMAL LOW (ref 13.0–17.0)
Hemoglobin: 11.7 g/dL — ABNORMAL LOW (ref 13.0–17.0)
MCH: 30.6 pg (ref 26.0–34.0)
MCH: 31.1 pg (ref 26.0–34.0)
MCHC: 33.4 g/dL (ref 30.0–36.0)
MCHC: 33.5 g/dL (ref 30.0–36.0)
MCV: 91.5 fL (ref 78.0–100.0)
MCV: 92.8 fL (ref 78.0–100.0)
Platelets: 186 10*3/uL (ref 150–400)
Platelets: 178 10*3/uL (ref 150–400)
RBC: 3.4 MIL/uL — ABNORMAL LOW (ref 4.22–5.81)
RBC: 3.76 MIL/uL — ABNORMAL LOW (ref 4.22–5.81)
RDW: 13.8 % (ref 11.5–15.5)
RDW: 14.1 % (ref 11.5–15.5)
WBC: 15.6 10*3/uL — ABNORMAL HIGH (ref 4.0–10.5)
WBC: 19.6 10*3/uL — ABNORMAL HIGH (ref 4.0–10.5)

## 2018-02-20 LAB — BASIC METABOLIC PANEL
Anion gap: 5 (ref 5–15)
Anion gap: 9 (ref 5–15)
BUN: 17 mg/dL (ref 6–20)
BUN: 22 mg/dL — ABNORMAL HIGH (ref 6–20)
CO2: 24 mmol/L (ref 22–32)
CO2: 23 mmol/L (ref 22–32)
Calcium: 8 mg/dL — ABNORMAL LOW (ref 8.9–10.3)
Calcium: 8.3 mg/dL — ABNORMAL LOW (ref 8.9–10.3)
Chloride: 109 mmol/L (ref 101–111)
Chloride: 100 mmol/L — ABNORMAL LOW (ref 101–111)
Creatinine, Ser: 0.79 mg/dL (ref 0.61–1.24)
Creatinine, Ser: 1.01 mg/dL (ref 0.61–1.24)
GFR calc Af Amer: >60 mL/min (ref >60)
GFR calc Af Amer: >60 mL/min (ref >60)
GFR calc non Af Amer: >60 mL/min (ref >60)
GFR calc non Af Amer: >60 mL/min (ref >60)
Glucose, Bld: 119 mg/dL — ABNORMAL HIGH (ref 65–99)
Glucose, Bld: 165 mg/dL — ABNORMAL HIGH (ref 65–99)
Potassium: 4.2 mmol/L (ref 3.5–5.1)
Potassium: 4.7 mmol/L (ref 3.5–5.1)
Sodium: 138 mmol/L (ref 135–145)
Sodium: 132 mmol/L — ABNORMAL LOW (ref 135–145)

## 2018-02-20 LAB — POCT I-STAT 3, ART BLOOD GAS (G3+)
Acid-base deficit: 2 mmol/L (ref 0.0–2.0)
Acid-base deficit: 4 mmol/L — ABNORMAL HIGH (ref 0.0–2.0)
Bicarbonate: 21.5 mmol/L (ref 20.0–28.0)
Bicarbonate: 22.5 mmol/L (ref 20.0–28.0)
O2 Saturation: 95 %
O2 Saturation: 97 %
Patient temperature: 37.6
Patient temperature: 38
TCO2: 23 mmol/L (ref 22–32)
TCO2: 24 mmol/L (ref 22–32)
pCO2 arterial: 40.1 mmHg (ref 32.0–48.0)
pCO2 arterial: 41 mmHg (ref 32.0–48.0)
pH, Arterial: 7.331 — ABNORMAL LOW (ref 7.350–7.450)
pH, Arterial: 7.361 (ref 7.350–7.450)
pO2, Arterial: 101 mmHg (ref 83.0–108.0)
pO2, Arterial: 86 mmHg (ref 83.0–108.0)

## 2018-02-20 LAB — MAGNESIUM
Magnesium: 2.4 mg/dL (ref 1.7–2.4)
Magnesium: 2.2 mg/dL (ref 1.7–2.4)

## 2018-02-20 MED ORDER — ENOXAPARIN SODIUM 40 MG/0.4ML ~~LOC~~ SOLN
40.0000 mg | Freq: Every day | SUBCUTANEOUS | Status: DC
  Administered 2018-02-21: 40 mg via SUBCUTANEOUS
  Filled 2018-02-20: qty 0.4

## 2018-02-20 MED ORDER — INSULIN ASPART 100 UNIT/ML ~~LOC~~ SOLN
0.0000 [IU] | SUBCUTANEOUS | Status: DC
  Administered 2018-02-20: 2 [IU] via SUBCUTANEOUS
  Administered 2018-02-20: 4 [IU] via SUBCUTANEOUS
  Administered 2018-02-20: 2 [IU] via SUBCUTANEOUS
  Administered 2018-02-21: 4 [IU] via SUBCUTANEOUS
  Administered 2018-02-21: 2 [IU] via SUBCUTANEOUS

## 2018-02-20 MED ORDER — LIVING WELL WITH DIABETES BOOK
Freq: Once | Status: AC
  Administered 2018-02-20: 15:00:00
  Filled 2018-02-20: qty 1

## 2018-02-20 MED ORDER — FUROSEMIDE 10 MG/ML IJ SOLN
20.0000 mg | Freq: Once | INTRAMUSCULAR | Status: AC
  Administered 2018-02-20: 20 mg via INTRAVENOUS
  Filled 2018-02-20: qty 2

## 2018-02-20 MED FILL — Heparin Sodium (Porcine) Inj 1000 Unit/ML: INTRAMUSCULAR | Qty: 30 | Status: AC

## 2018-02-20 MED FILL — Magnesium Sulfate Inj 50%: INTRAMUSCULAR | Qty: 10 | Status: AC

## 2018-02-20 MED FILL — Potassium Chloride Inj 2 mEq/ML: INTRAVENOUS | Qty: 40 | Status: AC

## 2018-02-20 MED FILL — Dexmedetomidine HCl in NaCl 0.9% IV Soln 400 MCG/100ML: INTRAVENOUS | Qty: 100 | Status: AC

## 2018-02-20 NOTE — Progress Notes (Addendum)
[facility] DAILY ICU PROGRESS NOTE                   [address]            [place] [address]          [phone]   1 Day Post-Op Procedure(s) (LRB): CORONARY ARTERY BYPASS GRAFTING (CABG) x three , using left internal mammary artery and right leg greater saphenous vein harvested endoscopically (N/A) AORTIC VALVE REPLACEMENT (AVR) (N/A) TRANSESOPHAGEAL ECHOCARDIOGRAM (TEE) (N/A)  Total Length of Stay:  LOS: 13 days   Subjective: Feels well, minor pain  Objective: Vital signs in last 24 hours: Temp:  [97.5 F (36.4 C)-100.6 F (38.1 C)] 100.2 F (37.9 C) (03/07 0715) Pulse Rate:  [78-95] 88 (03/07 0715) Cardiac Rhythm: Atrial paced (03/07 0515) Resp:  [11-31] 22 (03/07 0715) BP: (101-122)/(50-79) 108/79 (03/07 0700) SpO2:  [93 %-100 %] 94 % (03/07 0739) Arterial Line BP: (112-168)/(34-54) 137/41 (03/07 0715) FiO2 (%):  [40 %-50 %] 40 % (03/07 0000) Weight:  [175 lb (79.4 kg)] 175 lb (79.4 kg) (03/07 0400)  Filed Weights   02/18/18 0500 02/19/18 0500 02/20/18 0400  Weight: 164 lb 1.6 oz (74.4 kg) 160 lb 1.6 oz (72.6 kg) 175 lb (79.4 kg)    Weight change: 14 lb 14.4 oz (6.759 kg)   Hemodynamic parameters for last 24 hours: PAP: (19-51)/(4-25) 37/17 CO:  [3.1 L/min-4.1 L/min] 4 L/min CI:  [1.7 L/min/m2-2.3 L/min/m2] 2.2 L/min/m2  Intake/Output from previous day: 03/06 0701 - 03/07 0700 In: 6637.1 [P.O.:240; I.V.:4567.1; Blood:250; NG/GT:30; IV [id number]:0626] Out: 9485 [Urine:2180; Blood:500; Chest Tube:450]  Intake/Output this shift: No intake/output data recorded.  Current Meds: Scheduled Meds: . acetaminophen  1,000 mg Oral Q6H  . aspirin EC  325 mg Oral Daily  . [START ON 02/21/2018] atorvastatin  80 mg Oral q1800  . bisacodyl  10 mg Oral Daily   Or  . bisacodyl  10 mg Rectal Daily  . chlorhexidine gluconate (MEDLINE KIT)  15 mL Mouth Rinse BID  . Chlorhexidine Gluconate Cloth  6 each Topical Daily  . docusate sodium  200 mg Oral Daily  .  levalbuterol  1.25 mg Nebulization Q6H WA  . mouth rinse  15 mL Mouth Rinse QID  . metoprolol tartrate  12.5 mg Oral BID  . [START ON 02/21/2018] pantoprazole  40 mg Oral Daily  . sodium chloride flush  10-40 mL Intracatheter Q12H  . sodium chloride flush  3 mL Intravenous Q12H   Continuous Infusions: . sodium chloride    . albumin human    .  ceFAZolin (ANCEF) IV Stopped (02/20/18 0334)  . lactated ringers Stopped (02/20/18 0300)  . lactated ringers 20 mL/hr at 02/20/18 0600  . phenylephrine (NEO-SYNEPHRINE) Adult infusion 15 mcg/min (02/20/18 0757)   PRN Meds:.albumin human, ipratropium-albuterol, metoprolol tartrate, midazolam, morphine injection, ondansetron (ZOFRAN) IV, oxyCODONE, sodium chloride flush, sodium chloride flush, traMADol  General appearance: alert, cooperative and no distress Neurologic: intact Heart: regular rate and rhythm Lungs: clear anteriorly Abdomen: benign Extremities: no edema Wound: dressings CDI  Lab Results: CBC: Recent Labs    02/19/18 2152 02/20/18 0406  WBC 18.3* 15.6*  HGB 10.6* 10.4*  HCT 31.3* 31.1*  PLT 197 186   BMET:  Recent Labs    02/18/18 0402  02/19/18 2102 02/19/18 2152 02/20/18 0406  NA 140   < > 140  --  138  K 3.8   < > 4.6  --  4.2  CL  105   < > 105  --  109  CO2 26  --   --   --  24  GLUCOSE 213*   < > 132*  --  119*  BUN 30*   < > 20  --  17  CREATININE 1.01   < > 0.70 0.76 0.79  CALCIUM 9.2  --   --   --  8.0*   < > = values in this interval not displayed.    CMET: Lab Results  Component Value Date   WBC 15.6 (H) 02/20/2018   HGB 10.4 (L) 02/20/2018   HCT 31.1 (L) 02/20/2018   PLT 186 02/20/2018   GLUCOSE 119 (H) 02/20/2018   CHOL 139 02/06/2018   TRIG 116 02/06/2018   HDL 42 02/06/2018   LDLCALC 74 02/06/2018   ALT 40 02/18/2018   AST 20 02/18/2018   NA 138 02/20/2018   K 4.2 02/20/2018   CL 109 02/20/2018   CREATININE 0.79 02/20/2018   BUN 17 02/20/2018   CO2 24 02/20/2018   INR 1.37  02/19/2018   HGBA1C 7.0 (H) 02/05/2018      PT/INR:  Recent Labs    02/19/18 1539  LABPROT 16.7*  INR 1.37   Radiology: Dg Chest Port 1 View  Result Date: 02/19/2018 CLINICAL DATA:  Status post coronary bypass grafting EXAM: PORTABLE CHEST 1 VIEW COMPARISON:  02/18/2018 FINDINGS: Cardiac shadow is mildly prominent accentuated by the portable technique. Endotracheal tube, nasogastric catheter and Swan-Ganz catheter are noted in satisfactory position. Pericardial drain, mediastinal drain and left thoracostomy catheter are noted. No pneumothorax is seen. The lungs are well aerated with bibasilar atelectatic changes. No focal infiltrate is seen. No bony abnormality is noted. IMPRESSION: Tubes and lines as described. Mild bibasilar atelectasis. Electronically Signed   By: Inez Catalina M.D.   On: 02/19/2018 15:49     Assessment/Plan: S/P Procedure(s) (LRB): CORONARY ARTERY BYPASS GRAFTING (CABG) x three , using left internal mammary artery and right leg greater saphenous vein harvested endoscopically (N/A) AORTIC VALVE REPLACEMENT (AVR) (N/A) TRANSESOPHAGEAL ECHOCARDIOGRAM (TEE) (N/A)  1 doing well 2 hemodyn stable , wide pulse pressure but perfusing well on low dose neo. HR is good, will d/c AAI pacing and monitor 3 monitor CT output, poss d/c later today 4 ABL - stabke, monitor 5 mild volume overload, spont diuresis currently is good, normal renal fxn 6 transition to SSI, will ask Diabetic coordinator to see, HG A1c 7.0- will need aggressive nutrition /lifestyle intervention with severe metabolic syndrome- he is motivated for smoking cessation 7 d/c aline/s.ganz 8 activity progressio 9 lovenox starting tomorrow  John Giovanni 02/20/2018 7:58 AM   I have seen and examined the patient and agree with the assessment and plan as outlined.  Doing well POD1.    Rexene Alberts, MD 02/20/2018 9:06 AM

## 2018-02-20 NOTE — Progress Notes (Addendum)
Pulmonary critical care progress note    Chart reviewed, patient seen and examined.  02/19/18 post-Op Procedure(s) (LRB): CORONARY ARTERY BYPASS GRAFTING (CABG) x three ,  using left internal mammary artery and right leg greater saphenous vein harvested endoscopically (N/A) AORTIC VALVE REPLACEMENT (AVR) (N/A) TRANSESOPHAGEAL ECHOCARDIOGRAM (TEE) (N/A)   Subjective: No CP, SOB this am when seen In rounds. OOB in chair now   PAST MEDICAL HISTORY :   has a past medical history of Aortic stenosis, Bilateral carotid bruits, CAD (coronary artery disease), Chronic bronchitis (HCC), Chronic lower back pain, COPD (chronic obstructive pulmonary disease) ([place]), Heart murmur, HTN (hypertension), Hypercholesteremia, Ischemic cardiomyopathy, Moderate aortic stenosis, Myocardial infarction ([place]) (~ 1998/1999), Tobacco abuse, and Type II diabetes mellitus ([place]).  has a past surgical history that includes LEFT HEART CATH AND CORONARY ANGIOGRAPHY (N/A, 02/06/2018); Tonsillectomy; Colonoscopy; and Coronary angioplasty with stent (~ 1998/1999).   reports that he quit smoking 9 days ago. His smoking use included cigarettes. He has a 15.00 pack-year smoking history. he has never used smokeless tobacco. He reports that he does not drink alcohol or use drugs.   Meds reviewed. Marland Kitchen acetaminophen  1,000 mg Oral Q6H  . aspirin EC  325 mg Oral Daily  . [START ON 02/21/2018] atorvastatin  80 mg Oral q1800  . bisacodyl  10 mg Oral Daily   Or  . bisacodyl  10 mg Rectal Daily  . chlorhexidine gluconate (MEDLINE KIT)  15 mL Mouth Rinse BID  . Chlorhexidine Gluconate Cloth  6 each Topical Daily  . docusate sodium  200 mg Oral Daily  . [START ON 02/21/2018] enoxaparin (LOVENOX) injection  40 mg Subcutaneous QHS  . insulin aspart  0-24 Units Subcutaneous Q4H  . levalbuterol  1.25 mg Nebulization Q6H WA  . mouth rinse  15 mL Mouth Rinse QID  . metoprolol tartrate  12.5 mg Oral BID  . [START ON 02/21/2018] pantoprazole  40  mg Oral Daily  . sodium chloride flush  10-40 mL Intracatheter Q12H  . sodium chloride flush  3 mL Intravenous Q12H    Physical Exam: Today's Vitals   02/20/18 1730 02/20/18 1800 02/20/18 1830 02/20/18 1900  BP:  (!) 143/69  95/82  Pulse:  88  92  Resp:  (!) 24  20  Temp:      TempSrc:      SpO2:  100%  96%  Weight:      Height:      PainSc: 6   6     General appearance - NAD, well built   HEENT- No pallor/icterus No nasal congestion or drainage No tongue swelling No Oral thrush   Cardiovascular  S1 and prosthetic S2 regular, no Murmur/gallop Incision dressed/dry    Respiratory Chest expansion equal Breath sounds equal and clear No rhonchi or rales     Abdomen    Soft,Nontender, nondistended Bowel sounds+   Extremities No swelling,No tenderness No clubbing   Neurological Alert, awake, oriented Pupils equal and reacting No facial asymmetry Tone normal Grossly intact   Xray chest 3/7 Otherwise stable lines and tubes, including right IJ approach Swan-Ganz catheter tip at the main pulmonary artery level and left chest tube. Stable lung volumes. Stable to mildly decreased pulmonary vascularity. Patchy bibasilar opacity has mildly increased. No discrete pleural effusion. No pneumothorax. Stable cardiac size and mediastinal contours. Sequelae of CABG and cardiac valve replacement. Negative visible bowel gas pattern. IMPRESSION: 1. Extubated and enteric tube removed. Otherwise stable lines and tubes. 2. Stable lung volumes  with mildly increased bibasilar atelectasis, but pulmonary vascularity appears improved. No pneumothorax.  Spirometry 02/12/18  c/w obstructive physiology    Results for DAMEIN, GAUNCE (MRN [id number]) as of 02/20/2018 16:29  Ref. Range 02/20/2018 01:51  pH, Arterial Latest Ref Range: 7.350 - 7.450  7.331 (L)  pCO2 arterial Latest Ref Range: 32.0 - 48.0 mmHg 41.0  pO2, Arterial Latest Ref Range: 83.0 - 108.0 mmHg 86.0  TCO2  Latest Ref Range: 22 - 32 mmol/L 23   Results for MOHAB, ASHBY (MRN [id number]) as of 02/20/2018 16:29  Ref. Range 02/20/2018 04:06  Sodium Latest Ref Range: 135 - 145 mmol/L 138  Potassium Latest Ref Range: 3.5 - 5.1 mmol/L 4.2  Chloride Latest Ref Range: 101 - 111 mmol/L 109  CO2 Latest Ref Range: 22 - 32 mmol/L 24  Glucose Latest Ref Range: 65 - 99 mg/dL 119 (H)  BUN Latest Ref Range: 6 - 20 mg/dL 17  Creatinine Latest Ref Range: 0.61 - 1.24 mg/dL 0.79  Calcium Latest Ref Range: 8.9 - 10.3 mg/dL 8.0 (L)  Anion gap Latest Ref Range: 5 - 15  5  Magnesium Latest Ref Range: 1.7 - 2.4 mg/dL 2.4  GFR, Est Non African American Latest Ref Range: >60 mL/min >60  GFR, Est African American Latest Ref Range: >60 mL/min >60  WBC Latest Ref Range: 4.0 - 10.5 K/uL 15.6 (H)  RBC Latest Ref Range: 4.22 - 5.81 MIL/uL 3.40 (L)  Hemoglobin Latest Ref Range: 13.0 - 17.0 g/dL 10.4 (L)  HCT Latest Ref Range: 39.0 - 52.0 % 31.1 (L)  MCV Latest Ref Range: 78.0 - 100.0 fL 91.5  MCH Latest Ref Range: 26.0 - 34.0 pg 30.6  MCHC Latest Ref Range: 30.0 - 36.0 g/dL 33.4  RDW Latest Ref Range: 11.5 - 15.5 % 13.8  Platelets Latest Ref Range: 150 - 400 K/uL 186       CT scan 2/21   There is mild to moderate left ventricular systolic dysfunction.  LV end diastolic pressure is severely elevated.  Ost RCA to Prox RCA lesion is 100% stenosed.  Mid LM to Dist LM lesion is 85% stenosed.  Ost 1st Mrg lesion is 95% stenosed.  Prox LAD to Mid LAD lesion is 40% stenosed.  Prox LAD lesion is 50% stenosed.  Ost 1st Diag to 1st Diag lesion is 60% stenosed.    02/20/18 EKG- sinus, inf MI features   TTE 02/06/18 - Left ventricle: The cavity size was normal. There was mild   concentric hypertrophy. Systolic function was mildly to   moderately reduced. The estimated ejection fraction was in the   range of 40% to 45%. Severe hypokinesis of the   mid-apicalanteroseptal, anterior, and apical myocardium. Moderate    hypokinesis of the apicalinferior myocardium. Features are   consistent with a pseudonormal left ventricular filling pattern,   with concomitant abnormal relaxation and increased filling   pressure (grade 2 diastolic dysfunction). - Aortic valve: There was moderate stenosis. Mean gradient (S): 20   mm Hg. Valve area (VTI): 1.06 cm^2. Valve area (Vmax): 1.27 cm^2. - Mitral valve: Calcified annulus. There was mild regurgitation.   Valve area by continuity equation (using LVOT flow): 2.62 cm^2. - Left atrium: The atrium was moderately dilated. - Pulmonary arteries: Systolic pressure was moderately increased.   PA peak pressure: 50 mm Hg (S).   2/21 Cardiac Cath 1.  Severe left main and three-vessel coronary artery disease with chronically occluded right coronary artery with left-to-right collaterals.  The coronary  arteries are overall heavily calcified and diffusely diseased. 2.  Mildly reduced LV systolic function with an EF of 40%.  Echocardiogram showed wall motion abnormalities in the anterior as well as inferior walls.  3.  Severely elevated filling pressures in the setting of severely elevated blood pressure. 4.  Moderate aortic stenosis with a peak to peak gradient of 20 mmHg   TEE 02/19/18 intra op   Septum: No Patent Foramen Ovale present.  Left atrium: Patent foramen ovale not present.  Mitral valve: The mitral valve appeared structurally normal. The leaflets coapted normally without prolapsing or flail segments noted. There was trace mitral insufficiency.  Right ventricle: Normal cavity size, wall thickness and ejection fraction.  Tricuspid valve: Trace regurgitation.  Pulmonic valve: Trace regurgitation.   Left Ventricle The left ventricular cavity was normal in size and measured 4.96 cm at end diastole at the mid-papillary level in the transgastric short axis view. There was mild concentric left ventricular hypertrophy at the mid-papillary view. The posterior wall measured  1.10 cm in the anterior wall measured 1.09 cm at end diastole. The distal anterior wall, anterior septum, and apex were thinned and akinetic. Left ventricular ejection fraction was estimated at 45% using the border detection software in the 4 chamber and 2 chamber views.  On the post-bypass exam, the left ventricular systolic function appeared unchanged. The ejection fraction was again estimated at 45%. There were no new regional wall motion abnormalities.   There was moderate aortic stenosis and mild aortic insufficiency. The aortic valve appeared trileaflet but was heavily calcified with severe restriction to opening. The peak transaortic velocity was 2.3 m/s. The mean gradient was 11 mmHg. The aortic annulus measured 2.3 cm. There was a central jet of aortic insufficiency which was judged as mild. The DVI was 0.30. In the post-bypass exam, there was a bioprosthetic valve in the aortic position. The leaflets opened normally without restriction. There was no aortic insufficiency. The mean transaortic gradient was 3 mmHg.     Assessment and Plan:  Status post coronary artery bypass graft placement 3/19  Status post extubation postop.    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   NSTEMI (non-ST elevated myocardial infarction) ([place])   COPD exacerbation (HCC)   CAD (coronary artery disease)   Acute systolic CHF (congestive heart failure) (HCC)   Ischemic cardiomyopathy   Type II diabetes mellitus (HCC)   Hyperlipidemia LDL goal <70   Tobacco abuse life long smoker 1PPD   COPD (chronic obstructive pulmonary disease) (HCC)   Aortic stenosis   Bronchitis, chronic obstructive, with exacerbation (HCC)    Recommendations    Pulmonary status is stable post op-    no infection/  NO bronchospasm  Encourage incentive spirometry  Wean oxygen as tolerated for saturation more than 92%  Continue bronchodilators  No need for  steroids at this time  Does not require antibiotics at this time.  DVT prophylaxis with Lovenox  Follow-up chest x-ray and labs   Postop management per cardiothoracic surgery   Thank you for letting me participate in the care of your patient.  ^^^^^^^^^^ I  Have personally spent   45 minutes  In the care of this Patient providing Critical care Services; Time includes review of chart, labs, imaging, coordinating care with other physicians and healthcare team members. Also includes time for frequent reevaluation and additional treatment implementation due to change in clinical condiiton of patient. Excludes time spent  for Procedure and Teaching.   ^^^^^^^^^^  Note subject to typographical and grammatical errors;   Any formal questions or concerns about the content, text, or information contained within the body of this dictation should be directly addressed to the physician  for  clarification.   Evans Lance, MD [facility] [place] Pager: [phone]

## 2018-02-20 NOTE — Progress Notes (Signed)
Spoke with patient and his girlfriend in the room. Patient states that he was diagnosed with "pre-diabetes" for about 20 years. Has been on Glipizide and Metformin for control. Last HgbA1C in February, 2019 was 7%.  States that he checks blood sugars at home about twice a day.  States that he does watch what he eats. Reviewed basic care for diabetes including foot care. Will order Living Well with Diabetes booklet for patient. Patient will be finding a PCP here in [place] and plans to move here within 6 months if all goes well. Will continue to monitor blood sugars while in the hospital.   Smith Mince RN BSN CDE Diabetes Coordinator Pager: [phone]  8am-5pm

## 2018-02-20 NOTE — Procedures (Addendum)
Extubation Procedure Note  Patient Details:   Name: Harold Parker DOB: 1946-01-15 MRN: [id number]   Airway Documentation:  Airway 8 mm (Active)  Secured at (cm) 23 cm 02/19/2018 11:20 PM  Secured By Pink Tape 02/19/2018 11:20 PM  Cuff Pressure (cm H2O) 24 cm H2O 02/19/2018  8:41 PM  Site Condition Dry 02/19/2018 11:20 PM    Evaluation  O2 sats: stable throughout Complications: No apparent complications Patient did tolerate procedure well. Bilateral Breath Sounds: Rhonchi, Expiratory wheezes, Diminished   Yes   Pulmonary mechanics done prior to extubation. -22NIF, 660 VT with good pt effort and positive cuff leak. Pt able to speak after extubation, voice is hoarse. Pt extubated to 4L nasal cannula and achieved 500 on IS with RN when lips are sealed good on IS. Vent is at bedside if pt may need again and  RT to cont to monitor.   Alexia Freestone F 02/20/2018, 12:48 AM

## 2018-02-20 NOTE — Addendum Note (Signed)
Addendum  created 02/20/18 0141 by Kipp Brood, MD   Diagnosis association updated

## 2018-02-20 NOTE — Progress Notes (Signed)
RN unable to draw blood from introducer.  Placed order for BMP and called phlebotomy to come obtain labs.

## 2018-02-21 ENCOUNTER — Inpatient Hospital Stay (HOSPITAL_COMMUNITY): Payer: Medicare Other

## 2018-02-21 LAB — CBC
HCT: 32.2 % — ABNORMAL LOW (ref 39.0–52.0)
Hemoglobin: 11 g/dL — ABNORMAL LOW (ref 13.0–17.0)
MCH: 31.3 pg (ref 26.0–34.0)
MCHC: 34.2 g/dL (ref 30.0–36.0)
MCV: 91.7 fL (ref 78.0–100.0)
Platelets: 159 10*3/uL (ref 150–400)
RBC: 3.51 MIL/uL — ABNORMAL LOW (ref 4.22–5.81)
RDW: 13.8 % (ref 11.5–15.5)
WBC: 18 10*3/uL — ABNORMAL HIGH (ref 4.0–10.5)

## 2018-02-21 LAB — BASIC METABOLIC PANEL
Anion gap: 8 (ref 5–15)
Anion gap: 11 (ref 5–15)
BUN: 22 mg/dL — ABNORMAL HIGH (ref 6–20)
BUN: 22 mg/dL — ABNORMAL HIGH (ref 6–20)
CO2: 22 mmol/L (ref 22–32)
CO2: 24 mmol/L (ref 22–32)
Calcium: 8.1 mg/dL — ABNORMAL LOW (ref 8.9–10.3)
Calcium: 8.5 mg/dL — ABNORMAL LOW (ref 8.9–10.3)
Chloride: 100 mmol/L — ABNORMAL LOW (ref 101–111)
Chloride: 95 mmol/L — ABNORMAL LOW (ref 101–111)
Creatinine, Ser: 0.99 mg/dL (ref 0.61–1.24)
Creatinine, Ser: 0.96 mg/dL (ref 0.61–1.24)
GFR calc Af Amer: >60 mL/min (ref >60)
GFR calc Af Amer: >60 mL/min (ref >60)
GFR calc non Af Amer: >60 mL/min (ref >60)
GFR calc non Af Amer: >60 mL/min (ref >60)
Glucose, Bld: 149 mg/dL — ABNORMAL HIGH (ref 65–99)
Glucose, Bld: 236 mg/dL — ABNORMAL HIGH (ref 65–99)
Potassium: 4.5 mmol/L (ref 3.5–5.1)
Potassium: 4.6 mmol/L (ref 3.5–5.1)
Sodium: 130 mmol/L — ABNORMAL LOW (ref 135–145)
Sodium: 130 mmol/L — ABNORMAL LOW (ref 135–145)

## 2018-02-21 LAB — GLUCOSE, CAPILLARY
Glucose-Capillary: 143 mg/dL — ABNORMAL HIGH (ref 65–99)
Glucose-Capillary: 182 mg/dL — ABNORMAL HIGH (ref 65–99)

## 2018-02-21 MED ORDER — ATORVASTATIN CALCIUM 80 MG PO TABS: 80.0000 mg | ORAL_TABLET | Freq: Every day | ORAL | Status: DC

## 2018-02-21 MED ORDER — ASPIRIN EC 81 MG PO TBEC
81.0000 mg | DELAYED_RELEASE_TABLET | Freq: Every day | ORAL | Status: DC
  Administered 2018-02-21 – 2018-02-26 (×6): 81 mg via ORAL
  Filled 2018-02-21 (×6): qty 1

## 2018-02-21 MED ORDER — LEVALBUTEROL HCL 1.25 MG/0.5ML IN NEBU
1.2500 mg | INHALATION_SOLUTION | Freq: Three times a day (TID) | RESPIRATORY_TRACT | Status: DC
  Administered 2018-02-21 – 2018-02-24 (×10): 1.25 mg via RESPIRATORY_TRACT
  Filled 2018-02-21 (×10): qty 0.5

## 2018-02-21 MED ORDER — PANTOPRAZOLE SODIUM 40 MG PO TBEC
40.0000 mg | DELAYED_RELEASE_TABLET | Freq: Two times a day (BID) | ORAL | Status: DC
  Administered 2018-02-21 – 2018-02-26 (×10): 40 mg via ORAL
  Filled 2018-02-21 (×10): qty 1

## 2018-02-21 MED ORDER — EZETIMIBE 10 MG PO TABS
10.0000 mg | ORAL_TABLET | Freq: Every day | ORAL | Status: DC
  Administered 2018-02-21 – 2018-02-26 (×6): 10 mg via ORAL
  Filled 2018-02-21 (×6): qty 1

## 2018-02-21 MED ORDER — MOVING RIGHT ALONG BOOK
Freq: Once | Status: AC
  Administered 2018-02-21: 11:00:00
  Filled 2018-02-21: qty 1

## 2018-02-21 MED ORDER — MAGNESIUM HYDROXIDE 400 MG/5ML PO SUSP
15.0000 mL | Freq: Three times a day (TID) | ORAL | Status: DC | PRN
  Administered 2018-02-21 – 2018-02-24 (×7): 15 mL via ORAL
  Filled 2018-02-21 (×7): qty 30

## 2018-02-21 MED ORDER — ATORVASTATIN CALCIUM 80 MG PO TABS
80.0000 mg | ORAL_TABLET | Freq: Every day | ORAL | Status: DC
  Administered 2018-02-21 – 2018-02-25 (×5): 80 mg via ORAL
  Filled 2018-02-21 (×5): qty 1

## 2018-02-21 MED ORDER — IPRATROPIUM BROMIDE 0.02 % IN SOLN
0.5000 mg | Freq: Three times a day (TID) | RESPIRATORY_TRACT | Status: DC
  Administered 2018-02-21 – 2018-02-24 (×10): 0.5 mg via RESPIRATORY_TRACT
  Filled 2018-02-21 (×10): qty 2.5

## 2018-02-21 MED ORDER — INSULIN ASPART 100 UNIT/ML ~~LOC~~ SOLN: 0.0000 [IU] | Freq: Three times a day (TID) | SUBCUTANEOUS | Status: DC

## 2018-02-21 MED ORDER — CLOPIDOGREL BISULFATE 75 MG PO TABS
75.0000 mg | ORAL_TABLET | Freq: Every day | ORAL | Status: DC
  Administered 2018-02-21 – 2018-02-26 (×6): 75 mg via ORAL
  Filled 2018-02-21 (×6): qty 1

## 2018-02-21 MED ORDER — LOSARTAN POTASSIUM 50 MG PO TABS
50.0000 mg | ORAL_TABLET | Freq: Every day | ORAL | Status: DC
  Administered 2018-02-21 – 2018-02-22 (×2): 50 mg via ORAL
  Filled 2018-02-21 (×2): qty 1

## 2018-02-21 MED ORDER — FOLIC ACID 0.5 MG HALF TAB
500.0000 ug | ORAL_TABLET | Freq: Every evening | ORAL | Status: DC
  Administered 2018-02-21 – 2018-02-25 (×5): 0.5 mg via ORAL
  Filled 2018-02-21 (×6): qty 1

## 2018-02-21 MED ORDER — ORAL CARE MOUTH RINSE
15.0000 mL | Freq: Two times a day (BID) | OROMUCOSAL | Status: DC
  Administered 2018-02-21 – 2018-02-25 (×6): 15 mL via OROMUCOSAL

## 2018-02-21 MED ORDER — FUROSEMIDE 10 MG/ML IJ SOLN
20.0000 mg | Freq: Four times a day (QID) | INTRAMUSCULAR | Status: AC
  Administered 2018-02-21 (×3): 20 mg via INTRAVENOUS
  Filled 2018-02-21 (×3): qty 2

## 2018-02-21 MED ORDER — POTASSIUM CHLORIDE CRYS ER 20 MEQ PO TBCR
20.0000 meq | EXTENDED_RELEASE_TABLET | Freq: Every day | ORAL | Status: DC
  Administered 2018-02-22 – 2018-02-24 (×3): 20 meq via ORAL
  Filled 2018-02-21 (×3): qty 1

## 2018-02-21 MED ORDER — FUROSEMIDE 40 MG PO TABS
40.0000 mg | ORAL_TABLET | Freq: Every day | ORAL | Status: DC
  Administered 2018-02-22 – 2018-02-25 (×4): 40 mg via ORAL
  Filled 2018-02-21 (×4): qty 1

## 2018-02-21 NOTE — Progress Notes (Signed)
Results for LAURIANO, CZAPLA (MRN [id number]) as of 02/21/2018 12:34  Ref. Range 02/20/2018 13:54 02/20/2018 15:44 02/20/2018 20:12 02/21/2018 00:10 02/21/2018 04:42  Glucose-Capillary Latest Ref Range: 65 - 99 mg/dL 975 (H) 883 (H) 254 (H) 182 (H) 143 (H)  Received diabetes coordinator consult. Patient was seen yesterday by the coordinator (see note from 3/7)   Recommend that CBGs be checked TID & HS while in the hospital.  May want to consider patient staying on Novolog SENSITIVE correction scale TID until discharge.   Will continue to monitor blood sugars while in the hospital.  Smith Mince RN BSN CDE Diabetes Coordinator Pager: [phone]  8am-5pm

## 2018-02-21 NOTE — Progress Notes (Signed)
Anesthesiology Follow-up:  Awake and alert. In good spirits, neuro intact. Walking with assistance.  VS: T- 37.3 BP- 155/60 HR- 92 (SR)  RR- 28 O2 sat 95% on RA  K-4.5 Na- 130 glucose- 149 BUN/Cr.- 22/0.8 H/H- 11.0/32.2 platelets- 159,000  Extubated 9 hours post-op.  72 year old male 2 days S/P CABG X 3 and AVR. Stable post-op course.  Harold Parker

## 2018-02-21 NOTE — Discharge Summary (Signed)
Physician Discharge Summary  Patient ID: Harold Parker MRN: [id number] DOB/AGE: 74-Oct-1947 74 y.o.  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 Active Problems:   COPD exacerbation (HCC)   NSTEMI (non-ST elevated myocardial infarction) (HCC)   CAD (coronary artery disease)   Acute systolic CHF (congestive heart failure) (HCC)   Ischemic cardiomyopathy   Type II diabetes mellitus (HCC)   Hyperlipidemia LDL goal <70   Tobacco abuse   Stage 3 severe COPD by GOLD classification (HCC)   Aortic stenosis   Bronchitis, chronic obstructive, with exacerbation (HCC)   Acute respiratory failure with hypoxia (HCC)   Influenza with respiratory manifestation   Preoperative respiratory examination   S/P CABG x 3   Patient Active Problem List   Diagnosis Date Noted  . 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 Preoperative respiratory examination 02/13/2018  . Acute respiratory failure with hypoxia (HCC) 02/11/2018  . Influenza with respiratory manifestation 02/11/2018  . Stage 3 severe COPD by GOLD classification (HCC)   . Aortic stenosis   . Bronchitis, chronic obstructive, with exacerbation (HCC)   . Tobacco abuse   . CAD (coronary artery disease) 02/07/2018  . Acute systolic CHF (congestive heart failure) (HCC) 02/07/2018  . Ischemic cardiomyopathy 02/07/2018  . Type II diabetes mellitus (HCC) 02/07/2018  . Hyperlipidemia LDL goal <70 02/07/2018  . COPD exacerbation (HCC) 02/05/2018  . NSTEMI (non-ST elevated myocardial infarction) (HCC) 02/05/2018    History of Present Illness:   at time of consultation   Harold Parker is a 72 yo white male from [place] with a known history of Hypertension, Diabetes, Nicotine abuse, Hyperlipidemia,  CAD S/P Stent in 1999 and COPD.  The patient states he was diagnosed with Influenza A on Monday.  He was started on Tamiflu and prednisone.  However, he developed worsening complaints of chest pain, shortness of breath, wheezing, and productive cough.  This prompted him to present to the ED at [facility].  Workup revealed the patient to be hypoxic.  His troponin level was minimally elevated at 0.36.  CXR showed evidence of bilateral pulmonary edema and minimal pleural effusions.  He was given ASA and Lasix in the ED and admitted for further care.  He was taken for cardiac catheterization on 02/06/2018 which revealed severe 3 vessel CAD with LM involvement.  His EF was mildly reduced at 40%.  He was also noted to have moderate aortic stenosis.  It was felt coronary bypass and aortic valve replacement and he was transferred to [facility] for further care.  The patient is currently chest pain free.  He does continue to be a little short of breath and is coughing a lot.  He had a low grade temperature overnight.  He remains on tamiflu and states he is feeling somewhat better since admission.  His smoking history has been 1 ppd for the past 50 years.  He was seen in cardiothoracic surgical consultation by Dr. Cornelius Parker who evaluated the patient and his studies and agree with recommendations to proceed with aortic valve replacement and CABG.  Discharged Condition: good  Hospital Course: The patient was medically stabilized and scheduled for surgery by Dr. Cornelius Parker.  On 02/19/2018 he was taken to the operating room where he underwent the below described procedure.  He tolerated it well and was taken to the surgical intensive care unit in stable condition.  Postoperative hospital course:  The patient has progressed quite nicely.  He was weaned from the ventilator without difficulty and remains neurologically intact.  He has maintained stable hemodynamics.  He has had some hypertension and ARB has been reinitiated.  All  routine lines, monitors and drainage devices have been discontinued in the standard fashion.  Blood sugars have been managed using standard insulin drip protocols.  Patient will be seen by the diabetic coordinator to assist with management.  The patient is maintaining normal sinus rhythm.  He does have an expected acute blood loss anemia which is mild and stable.  He has postoperative volume excess and is responding well with good urine output and normal creatinine.  The patient did have recent flu and has severe COPD so pulmonary medicine has assisted with management.  He is making steady progress in this regard.  He is tolerating gradually increasing activity using cardiac rehab protocols.  Incisions are noted to be healing well without evidence of infection.  He is started on DA PT with platelet therapy.  He has been started on a statin.  He is on a low-dose beta-blocker.  At the time of discharge the patient is felt to be quite stable.  Consults: cardiology, pulmonology  Significant Diagnostic Studies: angiography: cardiac cath LEFT HEART CATH AND CORONARY ANGIOGRAPHY  Conclusion     There is mild to moderate left ventricular systolic dysfunction.  LV end diastolic pressure is severely elevated.  Ost RCA to Prox RCA lesion is 100% stenosed.  Mid LM to Dist LM lesion is 85% stenosed.  Ost 1st Mrg lesion is 95% stenosed.  Prox LAD to Mid LAD lesion is 40% stenosed.  Prox LAD lesion is 50% stenosed.  Ost 1st Diag to 1st Diag lesion is 60% stenosed.   1.  Severe left main and three-vessel coronary artery disease with chronically occluded right coronary artery with left-to-right collaterals.  The coronary arteries are overall heavily calcified and diffusely diseased. 2.  Mildly reduced LV systolic function with an EF of 40%.  Echocardiogram showed wall motion abnormalities in the anterior as well as inferior walls.  3.  Severely elevated filling pressures in the setting of severely  elevated blood pressure. 4.  Moderate aortic stenosis with a peak to peak gradient of 20 mmHg.  Recommendations: Transfer to [facility] for CABG and aortic valve replacement.  The patient will need to be tuned up given flu.  He also appears to be volume overloaded and blood pressure is uncontrolled.  I added losartan and will diuresis with furosemide. Heparin drip to be resumed in 8 hours.     Treatments: surgery:   Date of Procedure:                02/19/2018  Preoperative Diagnosis:       ? Moderate Aortic Stenosis ? Severe Left Main and 3-vessel Coronary Artery Disease  Postoperative Diagnosis:    Same  Procedure:        Aortic Valve Replacement             Edwards Inspiris Resilia Stented Bovine Pericardial Tissue Valve (size 25mm, ref # [id number], serial # [id number])   Coronary Artery Bypass Grafting x 3             Left Internal Mammary Artery to Distal Left Anterior Descending Coronary Artery  Saphenous Vein Graft to First Diagonal Branch Coronary Artery             Saphenous Vein Graft to Obtuse Marginal Branch of Left Circumflex Coronary Artery             Endoscopic Vein Harvest from Right Thigh and Lower Leg  Surgeon:        Salvatore Decent. Harold Moras, MD  Assistant:       Rowe Clack, PA-C  Anesthesia:    Kipp Brood, MD  Operative Findings: ? Moderate aortic stenosis ? Mild left ventricular systolic dysfunction ? Good quality left internal mammary artery conduit ? Good quality saphenous vein conduit ? Good quality target vessels for grafting   Discharge Exam: Blood pressure (!) 108/52, pulse 81, temperature 98.4 F (36.9 C), temperature source Oral, resp. rate (!) 29, height 5\' 6"  (1.676 m), weight 169 lb 9.6 oz (76.9 kg), SpO2 90 %.   General appearance: alert, cooperative and no distress Heart: regular rate and rhythm, S1, S2 normal, no murmur, click, rub or gallop Lungs: clear to auscultation bilaterally Abdomen: soft, non-tender; bowel sounds  normal; no masses,  no organomegaly Extremities: extremities normal, atraumatic, no cyanosis or edema Wound: clean and dry    Disposition: 02-Transferred to [facility]  Discharge Instructions    Amb Referral to Cardiac Rehabilitation   Complete by:  As directed    Diagnosis:  CABG   CABG X ___:  3     Allergies as of 02/26/2018   No Known Allergies     Medication List    STOP taking these medications   amLODipine 2.5 MG tablet Commonly known as:  NORVASC   chlorpheniramine-HYDROcodone 10-8 MG/5ML Suer Commonly known as:  TUSSIONEX PENNKINETIC ER   furosemide 10 MG/ML injection Commonly known as:  LASIX Replaced by:  furosemide 40 MG tablet   Grapeseed Extract 500-50 MG Caps   heparin 100-0.45 UNIT/ML-% infusion   isosorbide mononitrate 60 MG 24 hr tablet Commonly known as:  IMDUR   oseltamivir 75 MG capsule Commonly known as:  TAMIFLU   predniSONE 50 MG tablet Commonly known as:  DELTASONE   Resveratrol 250 MG Caps     TAKE these medications   aspirin 81 MG EC tablet Take 1 tablet (81 mg total) by mouth daily. Start taking on:  02/27/2018 What changed:    medication strength  how much to take   atorvastatin 80 MG tablet Commonly known as:  LIPITOR Take 1 tablet (80 mg total) by mouth daily at 6 PM. What changed:    medication strength  how much to take   clopidogrel 75 MG tablet Commonly known as:  PLAVIX Take 1 tablet (75 mg total) by mouth daily. Start taking on:  02/27/2018   ezetimibe 10 MG tablet Commonly known as:  ZETIA Take 10 mg by mouth daily.   folic acid 400 MCG tablet Commonly known as:  FOLVITE Take 400 mcg by mouth every evening.   furosemide 40 MG tablet Commonly known as:  LASIX Please take one 40mg  Tab BID (twice a day) for 3 days then take one 40mg  tab once a day until we see you in follow-up. Replaces:  furosemide 10 MG/ML injection   glipiZIDE 5 MG tablet Commonly known as:  GLUCOTROL Take 0.5 tablets  (2.5 mg total) by mouth daily before breakfast. Start taking on:  02/27/2018   ipratropium-albuterol 0.5-2.5 (3) MG/3ML Soln Commonly known as:  DUONEB Take 3 mLs by nebulization every 6 (six)  hours as needed.   losartan 100 MG tablet Commonly known as:  COZAAR Take 1 tablet (100 mg total) by mouth daily.   metFORMIN 1000 MG tablet Commonly known as:  GLUCOPHAGE Take 1 tablet (1,000 mg total) by mouth 2 (two) times daily with a meal.   metoprolol tartrate 50 MG tablet Commonly known as:  LOPRESSOR Take 1 tablet (50 mg total) by mouth 2 (two) times daily.   oxyCODONE 5 MG immediate release tablet Commonly known as:  Oxy IR/ROXICODONE Take 1 tablet (5 mg total) by mouth every 6 (six) hours as needed for severe pain.   Potassium Chloride ER 20 MEQ Tbcr Please take one tab twice a day for three days then take one tab daily until we see you in follow-up.   vitamin C 1000 MG tablet Take 1,000 mg by mouth daily.   VITAMIN D (CHOLECALCIFEROL) PO Take by mouth every morning.            Durable Medical Equipment  (From admission, onward)        Start     Ordered   02/26/18 1317  For home use only DME oxygen  Once    Question Answer Comment  Mode or (Route) Nasal cannula   Liters per Minute 2   Frequency Continuous (stationary and portable oxygen unit needed)   Oxygen conserving device Yes   Oxygen delivery system Gas      02/26/18 1317   02/25/18 0728  For home use only DME oxygen  Once    Question Answer Comment  Mode or (Route) Nasal cannula   Liters per Minute 2   Frequency Continuous (stationary and portable oxygen unit needed)   Oxygen delivery system Gas      02/25/18 0727     Follow-up Information    Lennette Bihari, MD Follow up.   Specialty:  Cardiology Why:  Please see discharge paperwork for a 2-week cardiology follow-up appointment. Contact information: [address] [place] [address] [phone]        Purcell Nails, MD  Follow up.   Specialty:  Cardiothoracic Surgery Why:  Appointment to see Dr. Cornelius Parker on 03/24/2018 at noon.  Please obtain a chest x-ray at 11:30 AM at [facility] imaging which is located in the same office complex on the first floor. Contact information: [address] [place] [address] [phone]        [facility] - Dme Follow up.   Why:  home 02 arranged- portable tank to be delivered to room prior to discharge.  Contact information: [address] [place] [address] [phone]         The patient has been discharged on:   1.Beta Blocker:  Yes [ y  ]                              No   [   ]                              If No, reason:  2.Ace Inhibitor/ARB: Yes [ y  ]                                     No  [    ]  If No, reason:  3.Statin:   Yes [ y  ]                  No  [   ]                  If No, reason:  4.Ecasa:  Yes  [ y  ]                  No   [   ]                  If No, reason:   Signed: Sharlene Dory 02/26/2018, 1:36 PM

## 2018-02-21 NOTE — Care Management Note (Addendum)
Case Management Note  Patient Details  Name: Harold Parker MRN: [id number] Date of Birth: Dec 15, 1946  Subjective/Objective:    Pt s/p CABG             Action/Plan:  PTA independent from home - actually a resident of [place].  Pt plans on remaining in [place] at discharge and will have his fiance/family provide 24 hour supervision as recommended.  Pt informed CM that his fiance is in the process of setting up an appt with local PCP.   Per CM DS - pt was recommended for [facility] at LOS with [facility] at discharge - CM shared the recommendation with both pt and daughter - pt is in agreement.    Expected Discharge Date:                  Expected Discharge Plan:  Home/Self Care  In-House Referral:  NA  Discharge planning Services  CM Consult  Post Acute Care Choice:    Choice offered to:     DME Arranged:    DME Agency:     HH Arranged:    HH Agency:     Status of Service:  In process, will continue to follow  If discussed at Long Length of Stay Meetings, dates discussed:    Additional Comments:  Cherylann Parr, RN 02/21/2018, 2:57 PM

## 2018-02-21 NOTE — Addendum Note (Signed)
Addendum  created 02/21/18 1407 by Kipp Brood, MD   Sign clinical note

## 2018-02-21 NOTE — Progress Notes (Signed)
Pulmonary critical care progress note    Subjective: No CP, SOB; was OOB in chair No new cx  PAST MEDICAL HISTORY :   has a past medical history of Aortic stenosis, Bilateral carotid bruits, CAD (coronary artery disease), Chronic bronchitis (HCC), Chronic lower back pain, COPD (chronic obstructive pulmonary disease) ([place]), Heart murmur, HTN (hypertension), Hypercholesteremia, Ischemic cardiomyopathy, Moderate aortic stenosis, Myocardial infarction ([place]) (~ 1998/1999), Tobacco abuse, and Type II diabetes mellitus ([place]).  has a past surgical history that includes LEFT HEART CATH AND CORONARY ANGIOGRAPHY (N/A, 02/06/2018); Tonsillectomy; Colonoscopy; and Coronary angioplasty with stent (~ 1998/1999).  reports that he quit smoking 9 days ago. His smoking use included cigarettes. He has a 15.00 pack-year smoking history. he has never used smokeless tobacco. He reports that he does not drink alcohol or use drugs. Marland Kitchen acetaminophen  1,000 mg Oral Q6H  . aspirin EC  81 mg Oral Daily  . atorvastatin  80 mg Oral q1800  . bisacodyl  10 mg Oral Daily   Or  . bisacodyl  10 mg Rectal Daily  . chlorhexidine gluconate (MEDLINE KIT)  15 mL Mouth Rinse BID  . clopidogrel  75 mg Oral Daily  . docusate sodium  200 mg Oral Daily  . ezetimibe  10 mg Oral Daily  . folic acid  846 mcg Oral QPM  . furosemide  20 mg Intravenous Q6H  . [START ON 02/22/2018] furosemide  40 mg Oral Daily  . ipratropium  0.5 mg Nebulization TID  . levalbuterol  1.25 mg Nebulization TID  . losartan  50 mg Oral Daily  . mouth rinse  15 mL Mouth Rinse QID  . metoprolol tartrate  12.5 mg Oral BID  . pantoprazole  40 mg Oral Daily  . [START ON 02/22/2018] potassium chloride  20 mEq Oral Daily  . sodium chloride flush  3 mL Intravenous Q12H         Today's Vitals   02/21/18 1115 02/21/18 1130 02/21/18 1145 02/21/18 1210  BP: (!) 135/59 (!) 178/58 (!) 143/58   Pulse: 94 93 92   Resp: (!) 24 (!) 23 (!) 23   Temp:    99.1 F (37.3  C)  TempSrc:    Oral  SpO2: 94% 94% 96%   Weight:      Height:      PainSc:         I/O last 3 completed shifts: In: 3519.3 [P.O.:960; I.V.:2079.3; NG/GT:30; IV Piggyback:450] Out: 2530 [Urine:1910; Chest Tube:620] Total I/O In: 580 [P.O.:480; IV Piggyback:100] Out: 760 [Urine:700; Chest Tube:60]    General appearance - NAD, well built  HEENT- No pallor/icterus No nasal congestion or drainage No tongue swelling No Oral thrush   Cardiovascular  S1 and prosthetic S2 regular, no Murmur/gallop Incision dressed/dry   Respiratory Chest expansion equal Breath sounds equal and clear No rhonchi or rales   Abdomen   Soft,Nontender, non distended,  Bowel sounds+   Extremities No swelling,No tenderness,  No clubbing  Neurological Alert, awake, oriented Pupils equal and reacting No facial asymmetry Tone normal Grossly intact   Xray chest 3/8 Results for GAMBLE, ENDERLE (MRN [id number]) as of 02/21/2018 12:49  Ref. Range 02/21/2018 00:10 02/21/2018 02:57 02/21/2018 04:42  Glucose-Capillary Latest Ref Range: 65 - 99 mg/dL 182 (H)  143 (H)  BASIC METABOLIC PANEL Unknown  Rpt (A)   Sodium Latest Ref Range: 135 - 145 mmol/L  130 (L)   Potassium Latest Ref Range: 3.5 - 5.1 mmol/L  4.5   Chloride  Latest Ref Range: 101 - 111 mmol/L  100 (L)   CO2 Latest Ref Range: 22 - 32 mmol/L  22   Glucose Latest Ref Range: 65 - 99 mg/dL  149 (H)   BUN Latest Ref Range: 6 - 20 mg/dL  22 (H)   Creatinine Latest Ref Range: 0.61 - 1.24 mg/dL  0.99   Calcium Latest Ref Range: 8.9 - 10.3 mg/dL  8.1 (L)   Anion gap Latest Ref Range: 5 - 15   8   GFR, Est Non African American Latest Ref Range: >60 mL/min  >60   GFR, Est African American Latest Ref Range: >60 mL/min  >60   WBC Latest Ref Range: 4.0 - 10.5 K/uL  18.0 (H)   RBC Latest Ref Range: 4.22 - 5.81 MIL/uL  3.51 (L)   Hemoglobin Latest Ref Range: 13.0 - 17.0 g/dL  11.0 (L)   HCT Latest Ref Range: 39.0 - 52.0 %  32.2 (L)   MCV Latest Ref Range:  78.0 - 100.0 fL  91.7   MCH Latest Ref Range: 26.0 - 34.0 pg  31.3   MCHC Latest Ref Range: 30.0 - 36.0 g/dL  34.2   RDW Latest Ref Range: 11.5 - 15.5 %  13.8   Platelets Latest Ref Range: 150 - 400 K/uL  159    Spirometry 02/12/18  c/w obstructive physiology  02/21/18 cxr Swan-Ganz catheter removed. Left chest tube in place. Mediastinal drain in place.Improvement in bibasilar atelectasis. Negative for edema or pneumonia. No pleural effusion;  Tiny left apical pneumothorax.  Results for MAKAR, SLATTER (MRN [id number]) as of 02/20/2018 16:29  Ref. Range 02/20/2018 01:51  pH, Arterial Latest Ref Range: 7.350 - 7.450  7.331 (L)  pCO2 arterial Latest Ref Range: 32.0 - 48.0 mmHg 41.0  pO2, Arterial Latest Ref Range: 83.0 - 108.0 mmHg 86.0  TCO2 Latest Ref Range: 22 - 32 mmol/L 23   Results for MAYANK, TEUSCHER (MRN [id number]) as of 02/20/2018 16:29  Ref. Range 02/20/2018 04:06  Sodium Latest Ref Range: 135 - 145 mmol/L 138  Potassium Latest Ref Range: 3.5 - 5.1 mmol/L 4.2  Chloride Latest Ref Range: 101 - 111 mmol/L 109  CO2 Latest Ref Range: 22 - 32 mmol/L 24  Glucose Latest Ref Range: 65 - 99 mg/dL 119 (H)  BUN Latest Ref Range: 6 - 20 mg/dL 17  Creatinine Latest Ref Range: 0.61 - 1.24 mg/dL 0.79  Calcium Latest Ref Range: 8.9 - 10.3 mg/dL 8.0 (L)  Anion gap Latest Ref Range: 5 - 15  5  Magnesium Latest Ref Range: 1.7 - 2.4 mg/dL 2.4  GFR, Est Non African American Latest Ref Range: >60 mL/min >60  GFR, Est African American Latest Ref Range: >60 mL/min >60  WBC Latest Ref Range: 4.0 - 10.5 K/uL 15.6 (H)  RBC Latest Ref Range: 4.22 - 5.81 MIL/uL 3.40 (L)  Hemoglobin Latest Ref Range: 13.0 - 17.0 g/dL 10.4 (L)  HCT Latest Ref Range: 39.0 - 52.0 % 31.1 (L)  MCV Latest Ref Range: 78.0 - 100.0 fL 91.5  MCH Latest Ref Range: 26.0 - 34.0 pg 30.6  MCHC Latest Ref Range: 30.0 - 36.0 g/dL 33.4  RDW Latest Ref Range: 11.5 - 15.5 % 13.8  Platelets Latest Ref Range: 150 - 400 K/uL 186   CT scan 2/21    There is mild to moderate left ventricular systolic dysfunction.  LV end diastolic pressure is severely elevated.  Ost RCA to Prox RCA lesion is 100% stenosed.  Mid  LM to Dist LM lesion is 85% stenosed.  Ost 1st Mrg lesion is 95% stenosed.  Prox LAD to Mid LAD lesion is 40% stenosed.  Prox LAD lesion is 50% stenosed.  Ost 1st Diag to 1st Diag lesion is 60% stenosed.   02/20/18 EKG- sinus, inf MI features   TTE 02/06/18 - Left ventricle: The cavity size was normal. There was mild   concentric hypertrophy. Systolic function was mildly to   moderately reduced. The estimated ejection fraction was in the   range of 40% to 45%. Severe hypokinesis of the   mid-apicalanteroseptal, anterior, and apical myocardium. Moderate   hypokinesis of the apicalinferior myocardium. Features are   consistent with a pseudonormal left ventricular filling pattern,   with concomitant abnormal relaxation and increased filling   pressure (grade 2 diastolic dysfunction). - Aortic valve: There was moderate stenosis. Mean gradient (S): 20   mm Hg. Valve area (VTI): 1.06 cm^2. Valve area (Vmax): 1.27 cm^2. - Mitral valve: Calcified annulus. There was mild regurgitation.   Valve area by continuity equation (using LVOT flow): 2.62 cm^2. - Left atrium: The atrium was moderately dilated. - Pulmonary arteries: Systolic pressure was moderately increased.   PA peak pressure: 50 mm Hg (S).   2/21 Cardiac Cath 1.  Severe left main and three-vessel coronary artery disease with chronically occluded right coronary artery with left-to-right collaterals.  The coronary arteries are overall heavily calcified and diffusely diseased. 2.  Mildly reduced LV systolic function with an EF of 40%.  Echocardiogram showed wall motion abnormalities in the anterior as well as inferior walls.  3.  Severely elevated filling pressures in the setting of severely elevated blood pressure. 4.  Moderate aortic stenosis with a peak to peak  gradient of 20 mmHg  TEE 02/19/18 intra op   Septum: No Patent Foramen Ovale present.  Left atrium: Patent foramen ovale not present.  Mitral valve: The mitral valve appeared structurally normal. The leaflets coapted normally without prolapsing or flail segments noted. There was trace mitral insufficiency.  Right ventricle: Normal cavity size, wall thickness and ejection fraction.  Tricuspid valve: Trace regurgitation.  Pulmonic valve: Trace regurgitation.   Left Ventricle The left ventricular cavity was normal in size and measured 4.96 cm at end diastole at the mid-papillary level in the transgastric short axis view. There was mild concentric left ventricular hypertrophy at the mid-papillary view. The posterior wall measured 1.10 cm in the anterior wall measured 1.09 cm at end diastole. The distal anterior wall, anterior septum, and apex were thinned and akinetic. Left ventricular ejection fraction was estimated at 45% using the border detection software in the 4 chamber and 2 chamber views.  On the post-bypass exam, the left ventricular systolic function appeared unchanged. The ejection fraction was again estimated at 45%. There were no new regional wall motion abnormalities.   There was moderate aortic stenosis and mild aortic insufficiency. The aortic valve appeared trileaflet but was heavily calcified with severe restriction to opening. The peak transaortic velocity was 2.3 m/s. The mean gradient was 11 mmHg. The aortic annulus measured 2.3 cm. There was a central jet of aortic insufficiency which was judged as mild. The DVI was 0.30. In the post-bypass exam, there was a bioprosthetic valve in the aortic position. The leaflets opened normally without restriction. There was no aortic insufficiency. The mean transaortic gradient was 3 mmHg.    Assessment and Plan:  2 Days Post-Op Procedure(s) (LRB): CORONARY ARTERY BYPASS GRAFTING (CABG) x three , using left  internal mammary artery and  right leg greater saphenous vein harvested endoscopically (N/A)AORTIC VALVE REPLACEMENT (AVR) (N/A)TRANSESOPHAGEAL ECHOCARDIOGRAM (TEE) (N/A)   Status post coronary artery bypass graft placement   Status post extubation postop.    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   NSTEMI (non-ST elevated myocardial infarction) ([place])   COPD exacerbation (HCC)   CAD (coronary artery disease)   Acute systolic CHF (congestive heart failure) (HCC)   Ischemic cardiomyopathy   Type II diabetes mellitus (HCC)   Hyperlipidemia LDL goal <70   Tobacco abuse life long smoker 1PPD   COPD (chronic obstructive pulmonary disease) (HCC)   Aortic stenosis   Bronchitis, chronic obstructive, with exacerbation (HCC)   Recommendations    Pulmonary status is stable post op  Encourage incentive spirometry  Wean oxygen as tolerated for saturation more than 92%  Continue bronchodilators  DVT prophylaxis  Follow-up chest x-ray and labs  As needed  Postop management per cardiothoracic surgery   Pulm CCM available to f/u over w/end if needed;  Pls reconsult again if needed;  can f/u with pulmonary as outpt if needed.  Thank you for letting me participate in the care of your patient.  ^^^^^^^^^^ I  Have personally spent  30 minutes  In the care of this Patient providing Critical care Services; Time includes review of chart, labs, imaging, coordinating care with other physicians and healthcare team members. Also includes time for frequent reevaluation and additional treatment implementation due to change in clinical condiiton of patient. Excludes time spent for Procedure and Teaching.  ^^^^^^^^^^ Note subject to typographical and grammatical errors;   Any formal questions or concerns about the content, text, or information contained within the body of this dictation should be directly addressed to the physician  for   clarification.   Evans Lance, MD Pulmonary and [place] Pager: [phone]

## 2018-02-21 NOTE — Progress Notes (Addendum)
      [address]       [name] [address]             [phone]        CARDIOTHORACIC SURGERY PROGRESS NOTE   R2 Days Post-Op Procedure(s) (LRB): CORONARY ARTERY BYPASS GRAFTING (CABG) x three , using left internal mammary artery and right leg greater saphenous vein harvested endoscopically (N/A) AORTIC VALVE REPLACEMENT (AVR) (N/A) TRANSESOPHAGEAL ECHOCARDIOGRAM (TEE) (N/A)  Subjective: Doing very well.  Has already ambulated twice this morning.  No complaints  Objective: Vital signs: BP Readings from Last 1 Encounters:  02/21/18 (!) 167/96   Pulse Readings from Last 1 Encounters:  02/21/18 94   Resp Readings from Last 1 Encounters:  02/21/18 (!) 23   Temp Readings from Last 1 Encounters:  02/21/18 99.3 F (37.4 C) (Oral)    Hemodynamics: PAP: (31)/(22) 31/22  Physical Exam:  Rhythm:   sinus  Breath sounds: Coarse but no wheezing  Heart sounds:  RRR  Incisions:  Dressing dry, intact  Abdomen:  Soft, non-distended, non-tender  Extremities:  Warm, well-perfused  Chest tubes:  low volume thin serosanguinous output, no air leak    Intake/Output from previous day: 03/07 0701 - 03/08 0700 In: 1452.3 [P.O.:720; I.V.:532.3; IV Piggyback:200] Out: 1315 [Urine:1025; Chest Tube:290] Intake/Output this shift: No intake/output data recorded.  Lab Results:  CBC: Recent Labs    02/20/18 1819 02/21/18 0257  WBC 19.6* 18.0*  HGB 11.7* 11.0*  HCT 34.9* 32.2*  PLT 178 159    BMET:  Recent Labs    02/20/18 1819 02/21/18 0257  NA 132* 130*  K 4.7 4.5  CL 100* 100*  CO2 23 22  GLUCOSE 165* 149*  BUN 22* 22*  CREATININE 1.01 0.99  CALCIUM 8.3* 8.1*     PT/INR:   Recent Labs    02/19/18 1539  LABPROT 16.7*  INR 1.37    CBG (last 3)  Recent Labs    02/20/18 2012 02/21/18 0010 02/21/18 0442  GLUCAP 167* 182* 143*    ABG    Component Value Date/Time   PHART 7.331 (L) 02/20/2018 0151   PCO2ART 41.0 02/20/2018 0151   PO2ART 86.0 02/20/2018 0151   HCO3 21.5 02/20/2018 0151   TCO2 23 02/20/2018 0151   ACIDBASEDEF 4.0 (H) 02/20/2018 0151   O2SAT 95.0 02/20/2018 0151    CXR: Remarkably clear.  Very mild atelectasis  Assessment/Plan: S/P Procedure(s) (LRB): CORONARY ARTERY BYPASS GRAFTING (CABG) x three , using left internal mammary artery and right leg greater saphenous vein harvested endoscopically (N/A) AORTIC VALVE REPLACEMENT (AVR) (N/A) TRANSESOPHAGEAL ECHOCARDIOGRAM (TEE) (N/A)  Doing very well POD2 Maintaining NSR w/ stable BP Breathing comfortably w/ O2 sats 92-94% on 3 L/min Expected post op acute blood loss anemia, mild Expected post op atelectasis, very mild Expected post op volume excess, weight reportedly 17 lbs > preop, UOP excellent Hypertension Severe COPD, recent influenza S/P acute non-STEMI complicated by acute resp failure   Mobilize  Diuresis  D/C tubes  Continue nebs, pulm toilet  DAPT  Statin  Continue low dose beta blocker  Restart ARB at reduced dose for now  Transfer stepdown   Purcell Nails, MD 02/21/2018 7:49 AM

## 2018-02-22 ENCOUNTER — Inpatient Hospital Stay (HOSPITAL_COMMUNITY): Payer: Medicare Other

## 2018-02-22 LAB — BASIC METABOLIC PANEL
Anion gap: 10 (ref 5–15)
BUN: 20 mg/dL (ref 6–20)
CO2: 27 mmol/L (ref 22–32)
Calcium: 8.1 mg/dL — ABNORMAL LOW (ref 8.9–10.3)
Chloride: 92 mmol/L — ABNORMAL LOW (ref 101–111)
Creatinine, Ser: 0.89 mg/dL (ref 0.61–1.24)
GFR calc Af Amer: >60 mL/min (ref >60)
GFR calc non Af Amer: >60 mL/min (ref >60)
Glucose, Bld: 283 mg/dL — ABNORMAL HIGH (ref 65–99)
Potassium: 4.3 mmol/L (ref 3.5–5.1)
Sodium: 129 mmol/L — ABNORMAL LOW (ref 135–145)

## 2018-02-22 LAB — CBC
HCT: 31.5 % — ABNORMAL LOW (ref 39.0–52.0)
Hemoglobin: 10.6 g/dL — ABNORMAL LOW (ref 13.0–17.0)
MCH: 30.1 pg (ref 26.0–34.0)
MCHC: 33.7 g/dL (ref 30.0–36.0)
MCV: 89.5 fL (ref 78.0–100.0)
Platelets: 165 10*3/uL (ref 150–400)
RBC: 3.52 MIL/uL — ABNORMAL LOW (ref 4.22–5.81)
RDW: 13.7 % (ref 11.5–15.5)
WBC: 14.6 10*3/uL — ABNORMAL HIGH (ref 4.0–10.5)

## 2018-02-22 MED ORDER — LOSARTAN POTASSIUM 50 MG PO TABS
100.0000 mg | ORAL_TABLET | Freq: Every day | ORAL | Status: DC
  Administered 2018-02-23 – 2018-02-26 (×4): 100 mg via ORAL
  Filled 2018-02-22 (×4): qty 2

## 2018-02-22 NOTE — Progress Notes (Addendum)
      [address]       [place] [address]             [phone]      3 Days Post-Op Procedure(s) (LRB): CORONARY ARTERY BYPASS GRAFTING (CABG) x three , using left internal mammary artery and right leg greater saphenous vein harvested endoscopically (N/A) AORTIC VALVE REPLACEMENT (AVR) (N/A) TRANSESOPHAGEAL ECHOCARDIOGRAM (TEE) (N/A) Subjective: Feels good now. This morning he was having some pain, nausea, and heart burn. He was given his morning medication and it is no longer symptomatic.   Objective: Vital signs in last 24 hours: Temp:  [97.7 F (36.5 C)-99.1 F (37.3 C)] 98.9 F (37.2 C) (03/09 0758) Pulse Rate:  [85-106] 85 (03/09 0758) Cardiac Rhythm: Sinus tachycardia (03/09 0701) Resp:  [17-33] 19 (03/09 1126) BP: (125-164)/(51-99) 142/53 (03/09 1126) SpO2:  [91 %-97 %] 93 % (03/09 0907) Weight:  [179 lb 3.7 oz (81.3 kg)] 179 lb 3.7 oz (81.3 kg) (03/09 0543)     Intake/Output from previous day: 03/08 0701 - 03/09 0700 In: 1060 [P.O.:960; IV Piggyback:100] Out: 2235 [Urine:2175; Chest Tube:60] Intake/Output this shift: No intake/output data recorded.  General appearance: alert, cooperative and no distress Heart: regular rate and rhythm, S1, S2 normal, no murmur, click, rub or gallop Lungs: clear to auscultation bilaterally Abdomen: soft, non-tender; bowel sounds normal; no masses,  no organomegaly Extremities: extremities normal, atraumatic, no cyanosis or edema Wound: clean and dry  Lab Results: Recent Labs    02/21/18 0257 02/22/18 0257  WBC 18.0* 14.6*  HGB 11.0* 10.6*  HCT 32.2* 31.5*  PLT 159 165   BMET:  Recent Labs    02/21/18 1500 02/22/18 0257  NA 130* 129*  K 4.6 4.3  CL 95* 92*  CO2 24 27  GLUCOSE 236* 283*  BUN 22* 20  CREATININE 0.96 0.89  CALCIUM 8.5* 8.1*    PT/INR:  Recent Labs    02/19/18 1539  LABPROT 16.7*  INR 1.37   ABG    Component Value Date/Time   PHART 7.331 (L) 02/20/2018 0151   HCO3  21.5 02/20/2018 0151   TCO2 23 02/20/2018 0151   ACIDBASEDEF 4.0 (H) 02/20/2018 0151   O2SAT 95.0 02/20/2018 0151   CBG (last 3)  Recent Labs    02/20/18 2012 02/21/18 0010 02/21/18 0442  GLUCAP 167* 182* 143*    Assessment/Plan: S/P Procedure(s) (LRB): CORONARY ARTERY BYPASS GRAFTING (CABG) x three , using left internal mammary artery and right leg greater saphenous vein harvested endoscopically (N/A) AORTIC VALVE REPLACEMENT (AVR) (N/A) TRANSESOPHAGEAL ECHOCARDIOGRAM (TEE) (N/A)  1. CV-NSR in the 90s. Taking Cozaar, Metoprolol, ASA, and Lipitor. Continue  DAPT. Increase Cozaar to 100mg  starting tomorrow (his home dose).  2. Pulm-Remains on 2L [place] with good oxygen saturation.  3. Renal-creatinine 0.89. Potassium is okay. Good urine output. Continue Lasix 40mg  daily. 4. H and H stable with expected acute blood loss anemia. Platelets 165k 5. Endo-overall good control on current regimen  Plan: Ambulate TID. Work on weaning oxygen today. Encouraged use of incentive spirometer.      LOS: 15 days    Sharlene Dory 02/22/2018   Chart reviewed, patient examined, agree with above. Rough night with heart burn and nausea but better today. Ambulating well Still on oxygen.

## 2018-02-22 NOTE — Progress Notes (Signed)
CARDIAC REHAB PHASE I   PRE:  Rate/Rhythm: 93 sinus rhythm  BP:  Supine:   Sitting: 142/53  Standing:    SaO2: 96% 2L  MODE:  Ambulation: 470  ft   POST:  Rate/Rhythem: 94 sinus rhythm  BP:  Supine:   Sitting: 148/87  Standing:    SaO2: 94% 2L  1107-1208 Pt ambulated in hallway x1 assist using rolling walker.  Steady gait, asymptomatic.  Pt returned to chair, call light in reach, family at bedside. Pt and family educated on activity restrictions/progression, sternal precautions and oriented to outpatient cardiac rehab.  At pt request, referral will be sent to [facility].  pt encouraged to continue IS use.  Pt and family given written and verbal instructions to watch heart surgery video, which they have interest and desire to watch at later time.  Understanding verbalized   Harold Parker Harold Parker  02/22/18  12:07 PM

## 2018-02-23 NOTE — Progress Notes (Addendum)
      [address]       [place] [address]             [phone]      4 Days Post-Op Procedure(s) (LRB): CORONARY ARTERY BYPASS GRAFTING (CABG) x three , using left internal mammary artery and right leg greater saphenous vein harvested endoscopically (N/A) AORTIC VALVE REPLACEMENT (AVR) (N/A) TRANSESOPHAGEAL ECHOCARDIOGRAM (TEE) (N/A) Subjective: Feels much better this morning. Bathing then getting ready to eat some breakfast. No nausea.   Objective: Vital signs in last 24 hours: Temp:  [97.9 F (36.6 C)-99.3 F (37.4 C)] 98.8 F (37.1 C) (03/10 0717) Pulse Rate:  [90-98] 95 (03/10 0717) Cardiac Rhythm: Normal sinus rhythm (03/10 0700) Resp:  [19-28] 25 (03/10 0717) BP: (133-148)/(47-61) 146/61 (03/10 0717) SpO2:  [92 %-96 %] 94 % (03/10 0717) Weight:  [172 lb 12.8 oz (78.4 kg)] 172 lb 12.8 oz (78.4 kg) (03/10 0323)     Intake/Output from previous day: 03/09 0701 - 03/10 0700 In: -  Out: 100 [Urine:100] Intake/Output this shift: No intake/output data recorded.  General appearance: alert, cooperative and no distress Heart: regular rate and rhythm, S1, S2 normal, no murmur, click, rub or gallop Lungs: clear to auscultation bilaterally Abdomen: soft, non-tender; bowel sounds normal; no masses,  no organomegaly Extremities: extremities normal, atraumatic, no cyanosis or edema Wound: clean and dry  Lab Results: Recent Labs    02/21/18 0257 02/22/18 0257  WBC 18.0* 14.6*  HGB 11.0* 10.6*  HCT 32.2* 31.5*  PLT 159 165   BMET:  Recent Labs    02/21/18 1500 02/22/18 0257  NA 130* 129*  K 4.6 4.3  CL 95* 92*  CO2 24 27  GLUCOSE 236* 283*  BUN 22* 20  CREATININE 0.96 0.89  CALCIUM 8.5* 8.1*    PT/INR: No results for input(s): LABPROT, INR in the last 72 hours. ABG    Component Value Date/Time   PHART 7.331 (L) 02/20/2018 0151   HCO3 21.5 02/20/2018 0151   TCO2 23 02/20/2018 0151   ACIDBASEDEF 4.0 (H) 02/20/2018 0151   O2SAT 95.0  02/20/2018 0151   CBG (last 3)  Recent Labs    02/20/18 2012 02/21/18 0010 02/21/18 0442  GLUCAP 167* 182* 143*    Assessment/Plan: S/P Procedure(s) (LRB): CORONARY ARTERY BYPASS GRAFTING (CABG) x three , using left internal mammary artery and right leg greater saphenous vein harvested endoscopically (N/A) AORTIC VALVE REPLACEMENT (AVR) (N/A) TRANSESOPHAGEAL ECHOCARDIOGRAM (TEE) (N/A)  1. CV-NSR in the 90s. Taking Cozaar, Metoprolol, ASA, and Lipitor. Continue  DAPT. Increase Cozaar to 100mg  starting today (his home dose).  2. Pulm-Remains on 2L [place] with good oxygen saturation. Wean as able. 3. Renal-creatinine 0.89. Potassium is okay. Good urine output. Continue Lasix 40mg  daily. Weight continues to trend down but remains 9 lbs over baseline.  4. H and H stable with expected acute blood loss anemia. Platelets 165k 5. Endo-overall good control on current regimen  Plan: Continue to wean oxygen as able. Continue diuretic regimen for fluid overload. Increase Cozaar to home dose. Home soon.    LOS: 16 days    Sharlene Dory 02/23/2018   Chart reviewed, patient examined, agree with above. He is continuing to improve and should be able to go home in the next day or two.

## 2018-02-23 NOTE — Progress Notes (Signed)
Patient refused to walk tonight,mentioned he did walk 3 times today.Will continue to monitor.

## 2018-02-24 DIAGNOSIS — I214 Non-ST elevation (NSTEMI) myocardial infarction: Secondary | ICD-10-CM

## 2018-02-24 DIAGNOSIS — Z951 Presence of aortocoronary bypass graft: Secondary | ICD-10-CM

## 2018-02-24 DIAGNOSIS — I350 Nonrheumatic aortic (valve) stenosis: Principal | ICD-10-CM

## 2018-02-24 LAB — GLUCOSE, CAPILLARY
Glucose-Capillary: 171 mg/dL — ABNORMAL HIGH (ref 65–99)
Glucose-Capillary: 123 mg/dL — ABNORMAL HIGH (ref 65–99)
Glucose-Capillary: 133 mg/dL — ABNORMAL HIGH (ref 65–99)
Glucose-Capillary: 185 mg/dL — ABNORMAL HIGH (ref 65–99)

## 2018-02-24 MED ORDER — METFORMIN HCL 500 MG PO TABS
500.0000 mg | ORAL_TABLET | Freq: Two times a day (BID) | ORAL | Status: DC
  Administered 2018-02-24 (×2): 500 mg via ORAL
  Filled 2018-02-24 (×2): qty 1

## 2018-02-24 MED ORDER — LEVALBUTEROL HCL 1.25 MG/0.5ML IN NEBU
1.2500 mg | INHALATION_SOLUTION | Freq: Two times a day (BID) | RESPIRATORY_TRACT | Status: DC
  Administered 2018-02-24 – 2018-02-25 (×2): 1.25 mg via RESPIRATORY_TRACT
  Filled 2018-02-24 (×2): qty 0.5

## 2018-02-24 MED ORDER — INSULIN ASPART 100 UNIT/ML ~~LOC~~ SOLN
0.0000 [IU] | Freq: Three times a day (TID) | SUBCUTANEOUS | Status: DC
  Administered 2018-02-24: 2 [IU] via SUBCUTANEOUS
  Administered 2018-02-24: 3 [IU] via SUBCUTANEOUS
  Administered 2018-02-24: 2 [IU] via SUBCUTANEOUS
  Administered 2018-02-25 (×3): 3 [IU] via SUBCUTANEOUS
  Administered 2018-02-26: 5 [IU] via SUBCUTANEOUS

## 2018-02-24 MED ORDER — METOPROLOL TARTRATE 25 MG PO TABS
25.0000 mg | ORAL_TABLET | Freq: Two times a day (BID) | ORAL | Status: DC
  Administered 2018-02-24 (×2): 25 mg via ORAL
  Filled 2018-02-24 (×2): qty 1

## 2018-02-24 MED ORDER — GLIPIZIDE 5 MG PO TABS
2.5000 mg | ORAL_TABLET | Freq: Every day | ORAL | Status: DC
  Administered 2018-02-24 – 2018-02-26 (×3): 2.5 mg via ORAL
  Filled 2018-02-24 (×3): qty 1

## 2018-02-24 MED ORDER — IPRATROPIUM BROMIDE 0.02 % IN SOLN
0.5000 mg | Freq: Two times a day (BID) | RESPIRATORY_TRACT | Status: DC
  Administered 2018-02-24 – 2018-02-25 (×2): 0.5 mg via RESPIRATORY_TRACT
  Filled 2018-02-24 (×2): qty 2.5

## 2018-02-24 NOTE — Progress Notes (Addendum)
[address]       [place] [address]             [phone]      5 Days Post-Op Procedure(s) (LRB): CORONARY ARTERY BYPASS GRAFTING (CABG) x three , using left internal mammary artery and right leg greater saphenous vein harvested endoscopically (N/A) AORTIC VALVE REPLACEMENT (AVR) (N/A) TRANSESOPHAGEAL ECHOCARDIOGRAM (TEE) (N/A) Subjective: Feels pretty well, + productive cough brown sputum. Currently on 3 liters [place]  Objective: Vital signs in last 24 hours: Temp:  [97.9 F (36.6 C)-98.9 F (37.2 C)] 98.1 F (36.7 C) (03/11 0456) Pulse Rate:  [95-104] 104 (03/11 0456) Cardiac Rhythm: Normal sinus rhythm (03/11 0416) Resp:  [24-32] 27 (03/11 0456) BP: (116-146)/(47-61) 131/49 (03/11 0456) SpO2:  [93 %-96 %] 94 % (03/11 0456) Weight:  [174 lb 1.6 oz (79 kg)] 174 lb 1.6 oz (79 kg) (03/11 0456)  Hemodynamic parameters for last 24 hours:    Intake/Output from previous day: 03/10 0701 - 03/11 0700 In: -  Out: 875 [Urine:875] Intake/Output this shift: No intake/output data recorded.  General appearance: alert, cooperative and no distress Heart: regular rate and rhythm Lungs: coarse upper airways, dim in left>right base Abdomen: benign Extremities: + LE edema Wound: incis healing well  Lab Results: Recent Labs    02/22/18 0257  WBC 14.6*  HGB 10.6*  HCT 31.5*  PLT 165   BMET:  Recent Labs    02/21/18 1500 02/22/18 0257  NA 130* 129*  K 4.6 4.3  CL 95* 92*  CO2 24 27  GLUCOSE 236* 283*  BUN 22* 20  CREATININE 0.96 0.89  CALCIUM 8.5* 8.1*    PT/INR: No results for input(s): LABPROT, INR in the last 72 hours. ABG    Component Value Date/Time   PHART 7.331 (L) 02/20/2018 0151   HCO3 21.5 02/20/2018 0151   TCO2 23 02/20/2018 0151   ACIDBASEDEF 4.0 (H) 02/20/2018 0151   O2SAT 95.0 02/20/2018 0151   CBG (last 3)  No results for input(s): GLUCAP in the last 72 hours.  Meds Scheduled Meds: . acetaminophen  1,000 mg Oral Q6H    . aspirin EC  81 mg Oral Daily  . atorvastatin  80 mg Oral q1800  . bisacodyl  10 mg Oral Daily   Or  . bisacodyl  10 mg Rectal Daily  . clopidogrel  75 mg Oral Daily  . docusate sodium  200 mg Oral Daily  . ezetimibe  10 mg Oral Daily  . folic acid  500 mcg Oral QPM  . furosemide  40 mg Oral Daily  . ipratropium  0.5 mg Nebulization TID  . levalbuterol  1.25 mg Nebulization TID  . losartan  100 mg Oral Daily  . mouth rinse  15 mL Mouth Rinse BID  . metoprolol tartrate  12.5 mg Oral BID  . pantoprazole  40 mg Oral BID  . potassium chloride  20 mEq Oral Daily  . sodium chloride flush  3 mL Intravenous Q12H   Continuous Infusions: . sodium chloride    . lactated ringers Stopped (02/20/18 0300)   PRN Meds:.ipratropium-albuterol, magnesium hydroxide, metoprolol tartrate, morphine injection, ondansetron (ZOFRAN) IV, oxyCODONE, sodium chloride flush, traMADol  Xrays Dg Chest 2 View  Result Date: 02/22/2018 CLINICAL DATA:  Atelectasis. EXAM: CHEST - 2 VIEW COMPARISON:  February 21, 2017 FINDINGS: The left chest tube has been removed with no pneumothorax. Worsening bibasilar atelectasis, right greater than left, probably with small associated effusions.  No other interval changes. IMPRESSION: Worsening bibasilar opacities, likely atelectasis, probably with small associated effusions. No pneumothorax after left chest tube removal. Electronically Signed   By: Gerome Sam III M.D   On: 02/22/2018 08:26    Assessment/Plan: S/P Procedure(s) (LRB): CORONARY ARTERY BYPASS GRAFTING (CABG) x three , using left internal mammary artery and right leg greater saphenous vein harvested endoscopically (N/A) AORTIC VALVE REPLACEMENT (AVR) (N/A) TRANSESOPHAGEAL ECHOCARDIOGRAM (TEE) (N/A)  1 overall progressing pretty well 2 remains on O2 , at least some degree of tracheo- bronchitis, no fevers, clearing sputum pretty well. Recently quit cigarettes. Cont to push pulm toilet/nebs. May require home O2 short  term 3 rhythm sinus tachy with PSVT,PVC's- will increase beta blocker dose 4 BP control improving, cont same ARB dose for now 5 Was on glipizide and metformin at home- cont CBG's and restart 6 d/c wires 7 recheck labs in am 8 home soon hopefully  LOS: 17 days    Rowe Clack 02/24/2018  I have seen and examined the patient and agree with the assessment and plan as outlined.  Will likely need home O2.  Possible d/c home tomorrow.  Purcell Nails, MD 02/24/2018 8:30 AM

## 2018-02-24 NOTE — Progress Notes (Signed)
CARDIAC REHAB PHASE I   PRE:  Rate/Rhythm: 95 SR    BP: sitting 116/48    SaO2: 95 2 1/2L, 95 RA  MODE:  Ambulation: 790 ft   POST:  Rate/Rhythm: 111 ST    BP: sitting 123/67     SaO2: 83 RA in hall, 86 2L, 89 2L after walk  Pt walked earlier off O2 and I was told he was ok on RA. This afternoon pt walked 200 ft and SaO2 83 RA. Applied 2L and rested. He was still slightly low on 2L (86) but up with pursed lip breathing. He is walking well, just encouraged him to practice pursed lip breathing, IS (increase frequency), and x2 more walks this pm on O2. Pt and family in agreement. Not using RW. Gave pt fake cigarette and quitting resources. He is motivated to be 100% done smoking. Family supportive. Will f/u am. [phone]   Harriet Masson CES, ACSM 02/24/2018 2:34 PM

## 2018-02-24 NOTE — Progress Notes (Signed)
SATURATION QUALIFICATIONS: (This note is used to comply with regulatory documentation for home oxygen)  Patient Saturations on Room Air at Rest = 95%  Patient Saturations on Room Air while Ambulating = 83%  Patient Saturations on 2 Liters of oxygen while Ambulating = 89%  Please briefly explain why patient needs home oxygen: Pt desats with ambulation. Will need home O2 atleast for ambulation. Not sure about rest or sleep. Harold Parker CES, ACSM 2:41 PM 02/24/2018

## 2018-02-24 NOTE — Progress Notes (Signed)
Pacing wires removed per order. Patient instructed on bedrest and VS initiated per protocol.

## 2018-02-25 DIAGNOSIS — I2510 Atherosclerotic heart disease of native coronary artery without angina pectoris: Secondary | ICD-10-CM

## 2018-02-25 LAB — CBC WITH DIFFERENTIAL/PLATELET
Basophils Absolute: 0 10*3/uL (ref 0.0–0.1)
Basophils Relative: 0 %
Eosinophils Absolute: 0.3 10*3/uL (ref 0.0–0.7)
Eosinophils Relative: 3 %
HCT: 31.2 % — ABNORMAL LOW (ref 39.0–52.0)
Hemoglobin: 10 g/dL — ABNORMAL LOW (ref 13.0–17.0)
Lymphocytes Relative: 20 %
Lymphs Abs: 2.1 10*3/uL (ref 0.7–4.0)
MCH: 30.2 pg (ref 26.0–34.0)
MCHC: 32.1 g/dL (ref 30.0–36.0)
MCV: 94.3 fL (ref 78.0–100.0)
Monocytes Absolute: 1 10*3/uL (ref 0.1–1.0)
Monocytes Relative: 9 %
Neutro Abs: 7.2 10*3/uL (ref 1.7–7.7)
Neutrophils Relative %: 68 %
Platelets: 283 10*3/uL (ref 150–400)
RBC: 3.31 MIL/uL — ABNORMAL LOW (ref 4.22–5.81)
RDW: 14.4 % (ref 11.5–15.5)
WBC: 10.6 10*3/uL — ABNORMAL HIGH (ref 4.0–10.5)

## 2018-02-25 LAB — BASIC METABOLIC PANEL
Anion gap: 9 (ref 5–15)
BUN: 22 mg/dL — ABNORMAL HIGH (ref 6–20)
CO2: 28 mmol/L (ref 22–32)
Calcium: 8.6 mg/dL — ABNORMAL LOW (ref 8.9–10.3)
Chloride: 100 mmol/L — ABNORMAL LOW (ref 101–111)
Creatinine, Ser: 0.98 mg/dL (ref 0.61–1.24)
GFR calc Af Amer: >60 mL/min (ref >60)
GFR calc non Af Amer: >60 mL/min (ref >60)
Glucose, Bld: 200 mg/dL — ABNORMAL HIGH (ref 65–99)
Potassium: 5.6 mmol/L — ABNORMAL HIGH (ref 3.5–5.1)
Sodium: 137 mmol/L (ref 135–145)

## 2018-02-25 LAB — GLUCOSE, CAPILLARY
Glucose-Capillary: 186 mg/dL — ABNORMAL HIGH (ref 65–99)
Glucose-Capillary: 166 mg/dL — ABNORMAL HIGH (ref 65–99)
Glucose-Capillary: 152 mg/dL — ABNORMAL HIGH (ref 65–99)
Glucose-Capillary: 147 mg/dL — ABNORMAL HIGH (ref 65–99)

## 2018-02-25 LAB — TSH: TSH: 1.408 u[IU]/mL (ref 0.350–4.500)

## 2018-02-25 LAB — MAGNESIUM: Magnesium: 2.1 mg/dL (ref 1.7–2.4)

## 2018-02-25 MED ORDER — FUROSEMIDE 10 MG/ML IJ SOLN
40.0000 mg | Freq: Once | INTRAMUSCULAR | Status: AC
  Administered 2018-02-25: 40 mg via INTRAVENOUS
  Filled 2018-02-25: qty 4

## 2018-02-25 MED ORDER — METFORMIN HCL 500 MG PO TABS
1000.0000 mg | ORAL_TABLET | Freq: Two times a day (BID) | ORAL | Status: DC
  Administered 2018-02-25 – 2018-02-26 (×3): 1000 mg via ORAL
  Filled 2018-02-25 (×3): qty 2

## 2018-02-25 MED ORDER — METOPROLOL TARTRATE 50 MG PO TABS
50.0000 mg | ORAL_TABLET | Freq: Two times a day (BID) | ORAL | Status: DC
  Administered 2018-02-25 – 2018-02-26 (×3): 50 mg via ORAL
  Filled 2018-02-25 (×3): qty 1

## 2018-02-25 MED ORDER — FUROSEMIDE 40 MG PO TABS
40.0000 mg | ORAL_TABLET | Freq: Two times a day (BID) | ORAL | Status: DC
Start: 2018-02-26 — End: 2018-02-26
  Administered 2018-02-26: 40 mg via ORAL
  Filled 2018-02-25: qty 1

## 2018-02-25 MED ORDER — AMIODARONE HCL 200 MG PO TABS
400.0000 mg | ORAL_TABLET | Freq: Two times a day (BID) | ORAL | Status: DC
  Administered 2018-02-25 – 2018-02-26 (×3): 400 mg via ORAL
  Filled 2018-02-25 (×3): qty 2

## 2018-02-25 MED FILL — Sodium Bicarbonate IV Soln 8.4%: INTRAVENOUS | Qty: 50 | Status: AC

## 2018-02-25 MED FILL — Electrolyte-R (PH 7.4) Solution: INTRAVENOUS | Qty: 3000 | Status: AC

## 2018-02-25 MED FILL — Heparin Sodium (Porcine) Inj 1000 Unit/ML: INTRAMUSCULAR | Qty: 20 | Status: AC

## 2018-02-25 MED FILL — Sodium Chloride IV Soln 0.9%: INTRAVENOUS | Qty: 2000 | Status: AC

## 2018-02-25 MED FILL — Mannitol IV Soln 20%: INTRAVENOUS | Qty: 500 | Status: AC

## 2018-02-25 NOTE — Progress Notes (Addendum)
[address]       [place] [address]             [phone]      6 Days Post-Op Procedure(s) (LRB): CORONARY ARTERY BYPASS GRAFTING (CABG) x three , using left internal mammary artery and right leg greater saphenous vein harvested endoscopically (N/A) AORTIC VALVE REPLACEMENT (AVR) (N/A) TRANSESOPHAGEAL ECHOCARDIOGRAM (TEE) (N/A) Subjective: Feels ok, does note palpitations at times  Objective: Vital signs in last 24 hours: Temp:  [98.2 F (36.8 C)-99.8 F (37.7 C)] 99.8 F (37.7 C) (03/12 0439) Pulse Rate:  [91-106] 105 (03/12 0439) Cardiac Rhythm: Normal sinus rhythm (03/12 0410) Resp:  [20-23] 23 (03/12 0439) BP: (92-146)/(46-93) 146/61 (03/12 0439) SpO2:  [92 %-99 %] 98 % (03/12 0439) Weight:  [170 lb 1.6 oz (77.2 kg)] 170 lb 1.6 oz (77.2 kg) (03/12 0439)  Hemodynamic parameters for last 24 hours:    Intake/Output from previous day: No intake/output data recorded. Intake/Output this shift: No intake/output data recorded.  General appearance: alert, cooperative and no distress Heart: regular rate and rhythm and tachy Lungs: slightly dim throughout, improved aeration Abdomen: benign Extremities: minimal edema Wound: incis healing well  Lab Results: Recent Labs    02/25/18 0305  WBC 10.6*  HGB 10.0*  HCT 31.2*  PLT 283   BMET:  Recent Labs    02/25/18 0305  NA 137  K 5.6*  CL 100*  CO2 28  GLUCOSE 200*  BUN 22*  CREATININE 0.98  CALCIUM 8.6*    PT/INR: No results for input(s): LABPROT, INR in the last 72 hours. ABG    Component Value Date/Time   PHART 7.331 (L) 02/20/2018 0151   HCO3 21.5 02/20/2018 0151   TCO2 23 02/20/2018 0151   ACIDBASEDEF 4.0 (H) 02/20/2018 0151   O2SAT 95.0 02/20/2018 0151   CBG (last 3)  Recent Labs    02/24/18 1625 02/24/18 2035 02/25/18 0610  GLUCAP 133* 185* 186*    Meds Scheduled Meds: . aspirin EC  81 mg Oral Daily  . atorvastatin  80 mg Oral q1800  . bisacodyl  10 mg Oral  Daily   Or  . bisacodyl  10 mg Rectal Daily  . clopidogrel  75 mg Oral Daily  . docusate sodium  200 mg Oral Daily  . ezetimibe  10 mg Oral Daily  . folic acid  500 mcg Oral QPM  . furosemide  40 mg Oral Daily  . glipiZIDE  2.5 mg Oral QAC breakfast  . insulin aspart  0-15 Units Subcutaneous TID WC  . ipratropium  0.5 mg Nebulization BID  . levalbuterol  1.25 mg Nebulization BID  . losartan  100 mg Oral Daily  . mouth rinse  15 mL Mouth Rinse BID  . metFORMIN  500 mg Oral BID WC  . metoprolol tartrate  25 mg Oral BID  . pantoprazole  40 mg Oral BID  . potassium chloride  20 mEq Oral Daily  . sodium chloride flush  3 mL Intravenous Q12H   Continuous Infusions: . sodium chloride    . lactated ringers Stopped (02/20/18 0300)   PRN Meds:.ipratropium-albuterol, magnesium hydroxide, metoprolol tartrate, morphine injection, ondansetron (ZOFRAN) IV, oxyCODONE, sodium chloride flush, traMADol  Xrays No results found.  Assessment/Plan: S/P Procedure(s) (LRB): CORONARY ARTERY BYPASS GRAFTING (CABG) x three , using left internal mammary artery and right leg greater saphenous vein harvested endoscopically (N/A) AORTIC VALVE REPLACEMENT (AVR) (N/A) TRANSESOPHAGEAL ECHOCARDIOGRAM (TEE) (N/A)  1 progress  is steady overall 2 rhythm issues with stachy, pac's, pvc's, afib/flutter- K+ 5.6 will hold further supplement for now. MG++2.1. Will add po amio, check TSH 3 pulm status is improving on current rx- will need O2 at home short term 4 blood sugars a little better on metformin, glipizide- will increase metformin dose  5 cont pulm toilet/rehab 6 sodium now normal 7 renal fxn normal, cont gentle diuresis for some volume overload 8 H/H stable, platelets improved   LOS: 18 days    Rowe Clack 02/25/2018  I have seen and examined the patient and agree with the assessment and plan as outlined.  Mostly NSR or sinus tach w/ single episode atrial tachycardia last 12 hours.  Will decrease beta  agonists and increase metoprolol.  Still needs diuresis - increase lasix.  Purcell Nails, MD 02/25/2018 2:43 PM

## 2018-02-25 NOTE — Plan of Care (Signed)
Reviewed, patient is progressing.

## 2018-02-25 NOTE — Progress Notes (Signed)
CARDIAC REHAB PHASE I   PRE:  Rate/Rhythm: 105 ST    BP: sitting 139/61    SaO2: 95 2L  MODE:  Ambulation: 790 ft   POST:  Rate/Rhythm: 125 ST    BP: sitting 160/59     SaO2: 92 2L  Pt doing well, steady, less SOB and hypoxia today. Used 2L walking. HR gradually increased to 125 ST. Encouraged more walking and IS, which pt agrees to. Discussed need for no caffeinated coffee while here in hospital, which he agreed to as well. [phone]  Harriet Masson CES, ACSM 02/25/2018 11:23 AM

## 2018-02-25 NOTE — Care Management Note (Signed)
Case Management Note Previous CM note completed by Cherylann Parr, RN 02/21/2018, 2:57 PM   Patient Details  Name: Margo Naula MRN: [id number] Date of Birth: [birth year]  Subjective/Objective:    Pt s/p CABG             Action/Plan:  PTA independent from home - actually a resident of [place].  Pt plans on remaining in [place] at discharge and will have his fiance/family provide 24 hour supervision as recommended.  Pt informed CM that his fiance is in the process of setting up an appt with local PCP.   Per CM DS - pt was recommended for [facility] at LOS with [facility] at discharge - CM shared the recommendation with both pt and daughter - pt is in agreement.    Expected Discharge Date:                  Expected Discharge Plan:  Home/Self Care  In-House Referral:  NA  Discharge planning Services  CM Consult  Post Acute Care Choice:  Durable Medical Equipment Choice offered to:  Patient  DME Arranged:  Oxygen DME Agency:  [facility]  HH Arranged:    HH Agency:  [facility]  Status of Service:  In process, will continue to follow  If discussed at Long Length of Stay Meetings, dates discussed:  3/12  Discharge Disposition:   Additional Comments:  02/25/18- 1240- Donn Pierini RN, CM- noted pt has order for home 02 and qualifying note on 3/11- spoke with Lupita Leash at [facility] regarding DME need for home 02- ?d/c for 3/13- pt was also discussed in QC mtg last week and deemed appropriate for [facility] program and would benefit from [facility] at time of discharge- please place order for [facility] for [facility] program for discharge. CM to continue to follow. Home 02 portable tank to be delivered prior to discharge once approved.   Donn Pierini [place], RN 02/25/2018, 12:39 PM [phone] 4E Transition Care Coordinator

## 2018-02-26 LAB — GLUCOSE, CAPILLARY
Glucose-Capillary: 240 mg/dL — ABNORMAL HIGH (ref 65–99)
Glucose-Capillary: 91 mg/dL (ref 65–99)
Glucose-Capillary: 135 mg/dL — ABNORMAL HIGH (ref 65–99)

## 2018-02-26 LAB — BASIC METABOLIC PANEL
Anion gap: 11 (ref 5–15)
BUN: 20 mg/dL (ref 6–20)
CO2: 28 mmol/L (ref 22–32)
Calcium: 8.6 mg/dL — ABNORMAL LOW (ref 8.9–10.3)
Chloride: 96 mmol/L — ABNORMAL LOW (ref 101–111)
Creatinine, Ser: 0.9 mg/dL (ref 0.61–1.24)
GFR calc Af Amer: >60 mL/min (ref >60)
GFR calc non Af Amer: >60 mL/min (ref >60)
Glucose, Bld: 112 mg/dL — ABNORMAL HIGH (ref 65–99)
Potassium: 3.9 mmol/L (ref 3.5–5.1)
Sodium: 135 mmol/L (ref 135–145)

## 2018-02-26 MED ORDER — CLOPIDOGREL BISULFATE 75 MG PO TABS: 75.0000 mg | ORAL_TABLET | Freq: Every day | ORAL | 1 refills | Status: DC

## 2018-02-26 MED ORDER — FUROSEMIDE 10 MG/ML IJ SOLN
40.0000 mg | Freq: Once | INTRAMUSCULAR | Status: AC
  Administered 2018-02-26: 40 mg via INTRAVENOUS

## 2018-02-26 MED ORDER — METFORMIN HCL 1000 MG PO TABS: 1000.0000 mg | ORAL_TABLET | Freq: Two times a day (BID) | ORAL | 1 refills | Status: DC

## 2018-02-26 MED ORDER — FUROSEMIDE 40 MG PO TABS
40.0000 mg | ORAL_TABLET | Freq: Two times a day (BID) | ORAL | Status: DC
Start: 2018-02-27 — End: 2018-02-26

## 2018-02-26 MED ORDER — FUROSEMIDE 40 MG PO TABS: ORAL_TABLET | ORAL | 0 refills | Status: DC

## 2018-02-26 MED ORDER — POTASSIUM CHLORIDE ER 20 MEQ PO TBCR: EXTENDED_RELEASE_TABLET | ORAL | 0 refills | Status: DC

## 2018-02-26 MED ORDER — METOPROLOL TARTRATE 50 MG PO TABS: 50.0000 mg | ORAL_TABLET | Freq: Two times a day (BID) | ORAL | 1 refills | Status: DC

## 2018-02-26 MED ORDER — ATORVASTATIN CALCIUM 80 MG PO TABS: 80.0000 mg | ORAL_TABLET | Freq: Every day | ORAL | 1 refills | Status: DC

## 2018-02-26 MED ORDER — ASPIRIN 81 MG PO TBEC: 81.0000 mg | DELAYED_RELEASE_TABLET | Freq: Every day | ORAL | Status: DC

## 2018-02-26 MED ORDER — GLIPIZIDE 5 MG PO TABS: 2.5000 mg | ORAL_TABLET | Freq: Every day | ORAL | 1 refills | Status: DC

## 2018-02-26 MED ORDER — OXYCODONE HCL 5 MG PO TABS: 5.0000 mg | ORAL_TABLET | Freq: Four times a day (QID) | ORAL | 0 refills | Status: DC | PRN

## 2018-02-26 NOTE — Progress Notes (Signed)
Patient is ready for discharge and has all of his belongings with him. He has all of his questions regarding his discharge answered. IV has been removed with no complications and catheter intact. He has been removed from the monitor and telemetry box has been removed. Patient will leave the unit by wheelchair and meet his friend at the front entrance. The patient will be transported home by his girlfriend, [name].

## 2018-02-26 NOTE — Progress Notes (Signed)
CARDIAC REHAB PHASE I   PRE:  Rate/Rhythm: 78 SR    BP: sitting 108/51    SaO2: 95 2L, 90 RA  MODE:  Ambulation: 370 ft   POST:  Rate/Rhythm: 90 SR    BP: sitting 117/47     SaO2: 90-93 RA, at 5 min rest 86 RA  Pt has been cleaning up this am and is tired. Able to walk on RA and SaO2 maintained >90 RA. However pt SAO2 down with sitting/lying back to 86 RA. He also wore O2 all night in bed. Pt will need home O2. I discussed this with pt and s/o. He can walk at controlled pace on RA but will need O2 for rest and sleep. Ed completed with pt and s/o with good reception. Will refer to [place] CRPII. Set up d/c video to watch. Left pt on 2L in room.   SATURATION QUALIFICATIONS: (This note is used to comply with regulatory documentation for home oxygen)  Patient Saturations on Room Air at Rest = 86%  Patient Saturations on Room Air while Ambulating = 91%  Patient Saturations on  Liters of oxygen while Ambulating = n/a%  Please briefly explain why patient needs home oxygen:   [phone] [employer] CES, ACSM 02/26/2018 11:30 AM

## 2018-02-26 NOTE — Discharge Instructions (Signed)
Endoscopic Saphenous Vein Harvesting, Care After Refer to this sheet in the next few weeks. These instructions provide you with information about caring for yourself after your procedure. Your health care provider may also give you more specific instructions. Your treatment has been planned according to current medical practices, but problems sometimes occur. Call your health care provider if you have any problems or questions after your procedure. What can I expect after the procedure? After the procedure, it is common to have:  Pain.  Bruising.  Swelling.  Numbness.  Follow these instructions at home: Medicine  Take over-the-counter and prescription medicines only as told by your health care provider.  Do not drive or operate heavy machinery while taking prescription pain medicine. Incision care   Follow instructions from your health care provider about how to take care of the cut made during surgery (incision). Make sure you: ? Wash your hands with soap and water before you change your bandage (dressing). If soap and water are not available, use hand sanitizer. ? Change your dressing as told by your health care provider. ? Leave stitches (sutures), skin glue, or adhesive strips in place. These skin closures may need to be in place for 2 weeks or longer. If adhesive strip edges start to loosen and curl up, you may trim the loose edges. Do not remove adhesive strips completely unless your health care provider tells you to do that.  Check your incision area every day for signs of infection. Check for: ? More redness, swelling, or pain. ? More fluid or blood. ? Warmth. ? Pus or a bad smell. General instructions  Raise (elevate) your legs above the level of your heart while you are sitting or lying down.  Do any exercises your health care providers have given you. These may include deep breathing, coughing, and walking exercises.  Do not shower, take baths, swim, or use a hot tub  unless told by your health care provider.  Wear your elastic stocking if told by your health care provider.  Keep all follow-up visits as told by your health care provider. This is important. Contact a health care provider if:  Medicine does not help your pain.  Your pain gets worse.  You have new leg bruises or your leg bruises get bigger.  You have a fever.  Your leg feels numb.  You have more redness, swelling, or pain around your incision.  You have more fluid or blood coming from your incision.  Your incision feels warm to the touch.  You have pus or a bad smell coming from your incision. Get help right away if:  Your pain is severe.  You develop pain, tenderness, warmth, redness, or swelling in any part of your leg.  You have chest pain.  You have trouble breathing. This information is not intended to replace advice given to you by your health care provider. Make sure you discuss any questions you have with your health care provider. Document Released: 08/15/2011 Document Revised: 05/10/2016 Document Reviewed: 10/17/2015 Elsevier Interactive Patient Education  2018 [employer]. Atrial Fibrillation Atrial fibrillation is a type of heartbeat that is irregular or fast (rapid). If you have this condition, your heart keeps quivering in a weird (chaotic) way. This condition can make it so your heart cannot pump blood normally. Having this condition gives a person more risk for stroke, heart failure, and other heart problems. There are different types of atrial fibrillation. Talk with your doctor to learn about the type that you  have. Follow these instructions at home:  Take over-the-counter and prescription medicines only as told by your doctor.  If your doctor prescribed a blood-thinning medicine, take it exactly as told. Taking too much of it can cause bleeding. If you do not take enough of it, you will not have the protection that you need against stroke and other  problems.  Do not use any tobacco products. These include cigarettes, chewing tobacco, and e-cigarettes. If you need help quitting, ask your doctor.  If you have apnea (obstructive sleep apnea), manage it as told by your doctor.  Do not drink alcohol.  Do not drink beverages that have caffeine. These include coffee, soda, and tea.  Maintain a healthy weight. Do not use diet pills unless your doctor says they are safe for you. Diet pills may make heart problems worse.  Follow diet instructions as told by your doctor.  Exercise regularly as told by your doctor.  Keep all follow-up visits as told by your doctor. This is important. Contact a doctor if:  You notice a change in the speed, rhythm, or strength of your heartbeat.  You are taking a blood-thinning medicine and you notice more bruising.  You get tired more easily when you move or exercise. Get help right away if:  You have pain in your chest or your belly (abdomen).  You have sweating or weakness.  You feel sick to your stomach (nauseous).  You notice blood in your throw up (vomit), poop (stool), or pee (urine).  You are short of breath.  You suddenly have swollen feet and ankles.  You feel dizzy.  Your suddenly get weak or numb in your face, arms, or legs, especially if it happens on one side of your body.  You have trouble talking, trouble understanding, or both.  Your face or your eyelid droops on one side. These symptoms may be an emergency. Do not wait to see if the symptoms will go away. Get medical help right away. Call your local emergency services (911 in the [place] Do not drive yourself to the hospital. This information is not intended to replace advice given to you by your health care provider. Make sure you discuss any questions you have with your health care provider. Document Released: 09/11/2008 Document Revised: 05/10/2016 Document Reviewed: 03/30/2015 Elsevier Interactive Patient Education  2018  [employer] Aortic Valve Replacement, Care After Refer to this sheet in the next few weeks. These instructions provide you with information about caring for yourself after your procedure. Your health care provider may also give you more specific instructions. Your treatment has been planned according to current medical practices, but problems sometimes occur. Call your health care provider if you have any problems or questions after your procedure. What can I expect after the procedure? After the procedure, it is common to have:  Pain around your incision area.  A small amount of blood or clear fluid coming from your incision.  Follow these instructions at home: Eating and drinking   Follow instructions from your health care provider about eating or drinking restrictions. ? Limit alcohol intake to no more than 1 drink per day for nonpregnant women and 2 drinks per day for men. One drink equals 12 oz of beer, 5 oz of wine, or 1 oz of hard liquor. ? Limit how much caffeine you drink. Caffeine can affect your heart's rate and rhythm.  Drink enough fluid to keep your urine clear or pale yellow.  Eat a heart-healthy diet. This  should include plenty of fresh fruits and vegetables. If you eat meat, it should be lean cuts. Avoid foods that are: ? High in salt, saturated fat, or sugar. ? Canned or highly processed. ? Fried. Activity  Return to your normal activities as told by your health care provider. Ask your health care provider what activities are safe for you.  Exercise regularly once you have recovered, as told by your health care provider.  Avoid sitting for more than 2 hours at a time without moving. Get up and move around at least once every 1-2 hours. This helps to prevent blood clots in the legs.  Do not lift anything that is heavier than 10 lb (4.5 kg) until your health care provider approves.  Avoid pushing or pulling things with your arms until your health care provider  approves. This includes pulling on handrails to help you climb stairs. Incision care   Follow instructions from your health care provider about how to take care of your incision. Make sure you: ? Wash your hands with soap and water before you change your bandage (dressing). If soap and water are not available, use hand sanitizer. ? Change your dressing as told by your health care provider. ? Leave stitches (sutures), skin glue, or adhesive strips in place. These skin closures may need to stay in place for 2 weeks or longer. If adhesive strip edges start to loosen and curl up, you may trim the loose edges. Do not remove adhesive strips completely unless your health care provider tells you to do that.  Check your incision area every day for signs of infection. Check for: ? More redness, swelling, or pain. ? More fluid or blood. ? Warmth. ? Pus or a bad smell. Medicines  Take over-the-counter and prescription medicines only as told by your health care provider.  If you were prescribed an antibiotic medicine, take it as told by your health care provider. Do not stop taking the antibiotic even if you start to feel better. Travel  Avoid airplane travel for as long as told by your health care provider.  When you travel, bring a list of your medicines and a record of your medical history with you. Carry your medicines with you. Driving  Ask your health care provider when it is safe for you to drive. Do not drive until your health care provider approves.  Do not drive or operate heavy machinery while taking prescription pain medicine. Lifestyle   Do not use any tobacco products, such as cigarettes, chewing tobacco, or e-cigarettes. If you need help quitting, ask your health care provider.  Resume sexual activity as told by your health care provider. Do not use medicines for erectile dysfunction unless your health care provider approves, if this applies.  Work with your health care provider  to keep your blood pressure and cholesterol under control, and to manage any other heart conditions that you have.  Maintain a healthy weight. General instructions  Do not take baths, swim, or use a hot tub until your health care provider approves.  Do not strain to have a bowel movement.  Avoid crossing your legs while sitting down.  Check your temperature every day for a fever. A fever may be a sign of infection.  If you are a woman and you plan to become pregnant, talk with your health care provider before you become pregnant.  Wear compression stockings if your health care provider instructs you to do this. These stockings help to prevent blood  clots and reduce swelling in your legs.  Tell all health care providers who care for you that you have an artificial (prosthetic) aortic valve. If you have or have had heart disease or endocarditis, tell all health care providers about these conditions as well.  Keep all follow-up visits as told by your health care provider. This is important. Contact a health care provider if:  You develop a skin rash.  You experience sudden, unexplained changes in your weight.  You have more redness, swelling, or pain around your incision.  You have more fluid or blood coming from your incision.  Your incision feels warm to the touch.  You have pus or a bad smell coming from your incision.  You have a fever. Get help right away if:  You develop chest pain that is different from the pain coming from your incision.  You develop shortness of breath or difficulty breathing.  You start to feel light-headed. These symptoms may represent a serious problem that is an emergency. Do not wait to see if the symptoms will go away. Get medical help right away. Call your local emergency services (911 in the [place] Do not drive yourself to the hospital. This information is not intended to replace advice given to you by your health care provider. Make sure you  discuss any questions you have with your health care provider. Document Released: 06/21/2005 Document Revised: 05/10/2016 Document Reviewed: 11/06/2015 Elsevier Interactive Patient Education  2017 [employer] Coronary Artery Bypass Grafting, Care After These instructions give you information on caring for yourself after your procedure. Your doctor may also give you more specific instructions. Call your doctor if you have any problems or questions after your procedure. Follow these instructions at home:  Only take medicine as told by your doctor. Take medicines exactly as told. Do not stop taking medicines or start any new medicines without talking to your doctor first.  Take your pulse as told by your doctor.  Do deep breathing as told by your doctor. Use your breathing device (incentive spirometer), if given, to practice deep breathing several times a day. Support your chest with a pillow or your arms when you take deep breaths or cough.  Keep the area clean, dry, and protected where the surgery cuts (incisions) were made. Remove bandages (dressings) only as told by your doctor. If strips were applied to surgical area, do not take them off. They fall off on their own.  Check the surgery area daily for puffiness (swelling), redness, or leaking fluid.  If surgery cuts were made in your legs: ? Avoid crossing your legs. ? Avoid sitting for long periods of time. Change positions every 30 minutes. ? Raise your legs when you are sitting. Place them on pillows.  Wear stockings that help keep blood clots from forming in your legs (compression stockings).  Only take sponge baths until your doctor says it is okay to take showers. Pat the surgery area dry. Do not rub the surgery area with a washcloth or towel. Do not bathe, swim, or use a hot tub until your doctor says it is okay.  Eat foods that are high in fiber. These include raw fruits and vegetables, whole grains, beans, and nuts. Choose lean  meats. Avoid canned, processed, and fried foods.  Drink enough fluids to keep your pee (urine) clear or pale yellow.  Weigh yourself every day.  Rest and limit activity as told by your doctor. You may be told to: ? Stop any activity  if you have chest pain, shortness of breath, changes in heartbeat, or dizziness. Get help right away if this happens. ? Move around often for short amounts of time or take short walks as told by your doctor. Gradually become more active. You may need help to strengthen your muscles and build endurance. ? Avoid lifting, pushing, or pulling anything heavier than 10 pounds (4.5 kg) for at least 6 weeks after surgery.  Do not drive until your doctor says it is okay.  Ask your doctor when you can go back to work.  Ask your doctor when you can begin sexual activity again.  Follow up with your doctor as told. Contact a doctor if:  You have puffiness, redness, more pain, or fluid draining from the incision site.  You have a fever.  You have puffiness in your ankles or legs.  You have pain in your legs.  You gain 2 or more pounds (0.9 kg) a day.  You feel sick to your stomach (nauseous) or throw up (vomit).  You have watery poop (diarrhea). Get help right away if:  You have chest pain that goes to your jaw or arms.  You have shortness of breath.  You have a fast or irregular heartbeat.  You notice a "clicking" in your breastbone when you move.  You have numbness or weakness in your arms or legs.  You feel dizzy or light-headed. This information is not intended to replace advice given to you by your health care provider. Make sure you discuss any questions you have with your health care provider. Document Released: 12/08/2013 Document Revised: 05/10/2016 Document Reviewed: 05/12/2013 Elsevier Interactive Patient Education  2017 [employer]     Coronary Artery Bypass Grafting, Care After This sheet gives you information about how to care for  yourself after your procedure. Your health care provider may also give you more specific instructions. If you have problems or questions, contact your health care provider. What can I expect after the procedure? After the procedure, it is common to have:  Nausea and a lack of appetite.  Constipation.  Weakness and fatigue.  Depression or irritability.  Pain or discomfort in your incision areas.  Follow these instructions at home: Medicines  Take over-the-counter and prescription medicines only as told by your health care provider. Do not stop taking medicines or start any new medicines without approval from your health care provider.  If you were prescribed an antibiotic medicine, take it as told by your health care provider. Do not stop taking the antibiotic even if you start to feel better.  Do not drive or use heavy machinery while taking prescription pain medicine. Incision care  Follow instructions from your health care provider about how to take care of your incisions. Make sure you: ? Wash your hands with soap and water before you change your bandage (dressing). If soap and water are not available, use hand sanitizer. ? Change your dressing as told by your health care provider. ? Leave stitches (sutures), skin glue, or adhesive strips in place. These skin closures may need to stay in place for 2 weeks or longer. If adhesive strip edges start to loosen and curl up, you may trim the loose edges. Do not remove adhesive strips completely unless your health care provider tells you to do that.  Keep incision areas clean, dry, and protected.  Check your incision areas every day for signs of infection. Check for: ? More redness, swelling, or pain. ? More fluid or blood. ?  Warmth. ? Pus or a bad smell.  If incisions were made in your legs: ? Avoid crossing your legs. ? Avoid sitting for long periods of time. Change positions every 30 minutes. ? Raise (elevate) your legs when you  are sitting. Bathing  Do not take baths, swim, or use a hot tub until your health care provider approves.  Only take sponge baths. Pat the incisions dry. Do not rub incisions with a washcloth or towel.  Ask your health care provider when you can shower. Eating and drinking  Eat foods that are high in fiber, such as raw fruits and vegetables, whole grains, beans, and nuts. Meats should be lean cut. Avoid canned, processed, and fried foods. This can help prevent constipation and is a recommended part of a heart-healthy diet.  Drink enough fluid to keep your urine clear or pale yellow.  Limit alcohol intake to no more than 1 drink a day for nonpregnant women and 2 drinks a day for men. One drink equals 12 oz of beer, 5 oz of wine, or 1 oz of hard liquor. Activity  Rest and limit your activity as told by your health care provider. You may be instructed to: ? Stop any activity right away if you have chest pain, shortness of breath, irregular heartbeats, or dizziness. Get help right away if you have any of these symptoms. ? Move around frequently for short periods or take short walks as directed by your health care provider. Gradually increase your activities. You may need physical therapy or cardiac rehabilitation to help strengthen your muscles and build your endurance. ? Avoid lifting, pushing, or pulling anything that is heavier than 10 lb (4.5 kg) for at least 6 weeks or as told by your health care provider.  Do not drive until your health care provider approves.  Ask your health care provider when you may return to work.  Ask your health care provider when you may resume sexual activity. General instructions  Do not use any products that contain nicotine or tobacco, such as cigarettes and e-cigarettes. If you need help quitting, ask your health care provider.  Take 2-3 deep breaths every few hours during the day, while you recover. This helps expand your lungs and prevent  complications like pneumonia after surgery.  If you were given a device called an incentive spirometer, use it several times a day to practice deep breathing. Support your chest with a pillow or your arms when you take deep breaths or cough.  Wear compression stockings as told by your health care provider. These stockings help to prevent blood clots and reduce swelling in your legs.  Weigh yourself every day. This helps identify if your body is holding (retaining) fluid that may make your heart and lungs work harder.  Keep all follow-up visits as told by your health care provider. This is important. Contact a health care provider if:  You have more redness, swelling, or pain around any incision.  You have more fluid or blood coming from any incision.  Any incision feels warm to the touch.  You have pus or a bad smell coming from any incision  You have a fever.  You have swelling in your ankles or legs.  You have pain in your legs.  You gain 2 lb (0.9 kg) or more a day.  You are nauseous or you vomit.  You have diarrhea. Get help right away if:  You have chest pain that spreads to your jaw or arms.  You are short of breath.  You have a fast or irregular heartbeat.  You notice a "clicking" in your breastbone (sternum) when you move.  You have numbness or weakness in your arms or legs.  You feel dizzy or light-headed. Summary  After the procedure, it is common to have pain or discomfort in the incision areas.  Do not take baths, swim, or use a hot tub until your health care provider approves.  Gradually increase your activities. You may need physical therapy or cardiac rehabilitation to help strengthen your muscles and build your endurance.  Weigh yourself every day. This helps identify if your body is holding (retaining) fluid that may make your heart and lungs work harder. This information is not intended to replace advice given to you by your health care provider.  Make sure you discuss any questions you have with your health care provider. Document Released: 06/22/2005 Document Revised: 10/22/2016 Document Reviewed: 10/22/2016 Elsevier Interactive Patient Education  [employer].

## 2018-02-26 NOTE — Progress Notes (Addendum)
      [address]       [place] [address]             [phone]      7 Days Post-Op Procedure(s) (LRB): CORONARY ARTERY BYPASS GRAFTING (CABG) x three , using left internal mammary artery and right leg greater saphenous vein harvested endoscopically (N/A) AORTIC VALVE REPLACEMENT (AVR) (N/A) TRANSESOPHAGEAL ECHOCARDIOGRAM (TEE) (N/A) Subjective: Feels okay this morning. Asking to go home.   Objective: Vital signs in last 24 hours: Temp:  [99 F (37.2 C)-100.7 F (38.2 C)] 99.1 F (37.3 C) (03/13 0425) Pulse Rate:  [83-110] 83 (03/13 0541) Cardiac Rhythm: Normal sinus rhythm (03/13 0700) Resp:  [21-29] 29 (03/13 0425) BP: (97-139)/(46-61) 128/53 (03/13 0425) SpO2:  [93 %-98 %] 97 % (03/13 0541) Weight:  [169 lb 9.6 oz (76.9 kg)] 169 lb 9.6 oz (76.9 kg) (03/13 0425)     Intake/Output from previous day: 03/12 0701 - 03/13 0700 In: 1080 [P.O.:1080] Out: 1446 [Urine:1445; Stool:1] Intake/Output this shift: No intake/output data recorded.  General appearance: alert, cooperative and no distress Heart: regular rate and rhythm, S1, S2 normal, no murmur, click, rub or gallop Lungs: clear to auscultation bilaterally Abdomen: soft, non-tender; bowel sounds normal; no masses,  no organomegaly Extremities: extremities normal, atraumatic, no cyanosis or edema Wound: clean and dry  Lab Results: Recent Labs    02/25/18 0305  WBC 10.6*  HGB 10.0*  HCT 31.2*  PLT 283   BMET:  Recent Labs    02/25/18 0305  NA 137  K 5.6*  CL 100*  CO2 28  GLUCOSE 200*  BUN 22*  CREATININE 0.98  CALCIUM 8.6*    PT/INR: No results for input(s): LABPROT, INR in the last 72 hours. ABG    Component Value Date/Time   PHART 7.331 (L) 02/20/2018 0151   HCO3 21.5 02/20/2018 0151   TCO2 23 02/20/2018 0151   ACIDBASEDEF 4.0 (H) 02/20/2018 0151   O2SAT 95.0 02/20/2018 0151   CBG (last 3)  Recent Labs    02/25/18 1643 02/25/18 2111 02/26/18 0621  GLUCAP 152* 147*  240*    Assessment/Plan: S/P Procedure(s) (LRB): CORONARY ARTERY BYPASS GRAFTING (CABG) x three , using left internal mammary artery and right leg greater saphenous vein harvested endoscopically (N/A) AORTIC VALVE REPLACEMENT (AVR) (N/A) TRANSESOPHAGEAL ECHOCARDIOGRAM (TEE) (N/A)  1. CV-NSR in the 80s. TSH within normal range. Amio PO added. Lopressor 50mg  BID and Cozaar 100mg  daily. Continue statin and ASA 2. Pulm-tolerating 2L [place] with good oxygen saturation. When he walks he does not need oxygen . Will order 6 minute walk to evaluate for home oxygen needs. 3. Renal-creatinine 0.98, hyperkalemic holding potassium replacement.  4. H and H stable 5. Endo-moderate control. On Glipizide, Metformin and insulin.   Plan: continue diuretics for fluid overload. Weight is trending down but remains 9lbs over baseline. May not need oxygen for home once closer to dry weight.    LOS: 19 days    Sharlene Dory 02/26/2018  I have seen and examined the patient and agree with the assessment and plan as outlined.  Check f/u BMET panel.  Still needs diuresis but probably can go home later today with home O2.  No Afib.  Will stop amiodarone  Purcell Nails, MD 02/26/2018 9:38 AM

## 2018-02-27 ENCOUNTER — Telehealth: Payer: Self-pay | Admitting: Cardiovascular Disease

## 2018-02-27 DIAGNOSIS — I214 Non-ST elevation (NSTEMI) myocardial infarction: Secondary | ICD-10-CM | POA: Diagnosis not present

## 2018-02-27 DIAGNOSIS — Z48812 Encounter for surgical aftercare following surgery on the circulatory system: Secondary | ICD-10-CM | POA: Diagnosis not present

## 2018-02-27 MED ORDER — LOSARTAN POTASSIUM 100 MG PO TABS: 100.0000 mg | ORAL_TABLET | Freq: Every day | ORAL | 3 refills | Status: DC

## 2018-02-27 NOTE — Telephone Encounter (Signed)
Losartan 100 mg daily was listed on patient discharge summary; however, he did not receive a prescription for it as discharge. Verified pharmacy and sent Rx in for patient. Patient appreciative.

## 2018-02-27 NOTE — Telephone Encounter (Signed)
Patient was released from heart surgery at [facility] without prescription for losartan Originally ordered by Dr. Imogene Burn at [facility] Patient had a cath Dr Lennie Muckle, Dr Cornelius Moras [facility] Follow up with patient caregiver [name] Patient has not taken today Prescription to be sent to [facility] on [address] in [place]

## 2018-02-28 DIAGNOSIS — I214 Non-ST elevation (NSTEMI) myocardial infarction: Secondary | ICD-10-CM | POA: Diagnosis not present

## 2018-02-28 DIAGNOSIS — Z48812 Encounter for surgical aftercare following surgery on the circulatory system: Secondary | ICD-10-CM | POA: Diagnosis not present

## 2018-03-03 DIAGNOSIS — Z48812 Encounter for surgical aftercare following surgery on the circulatory system: Secondary | ICD-10-CM | POA: Diagnosis not present

## 2018-03-03 DIAGNOSIS — I214 Non-ST elevation (NSTEMI) myocardial infarction: Secondary | ICD-10-CM | POA: Diagnosis not present

## 2018-03-05 DIAGNOSIS — I214 Non-ST elevation (NSTEMI) myocardial infarction: Secondary | ICD-10-CM | POA: Diagnosis not present

## 2018-03-05 DIAGNOSIS — Z48812 Encounter for surgical aftercare following surgery on the circulatory system: Secondary | ICD-10-CM | POA: Diagnosis not present

## 2018-03-11 ENCOUNTER — Encounter: Payer: Self-pay | Admitting: Physician Assistant

## 2018-03-11 ENCOUNTER — Ambulatory Visit (INDEPENDENT_AMBULATORY_CARE_PROVIDER_SITE_OTHER): Payer: BLUE CROSS/BLUE SHIELD | Admitting: Physician Assistant

## 2018-03-11 ENCOUNTER — Other Ambulatory Visit
Admission: RE | Admit: 2018-03-11 | Discharge: 2018-03-11 | Disposition: A | Discharge status: Home / Self Care | Payer: Medicare Other | Source: Ambulatory Visit | Attending: Physician Assistant | Admitting: Physician Assistant

## 2018-03-11 VITALS — BP 110/58 | HR 88 | Ht 66.0 in | Wt 160.5 lb

## 2018-03-11 DIAGNOSIS — I9789 Other postprocedural complications and disorders of the circulatory system, not elsewhere classified: Secondary | ICD-10-CM

## 2018-03-11 DIAGNOSIS — I2510 Atherosclerotic heart disease of native coronary artery without angina pectoris: Secondary | ICD-10-CM | POA: Diagnosis not present

## 2018-03-11 DIAGNOSIS — I350 Nonrheumatic aortic (valve) stenosis: Secondary | ICD-10-CM

## 2018-03-11 DIAGNOSIS — I255 Ischemic cardiomyopathy: Secondary | ICD-10-CM

## 2018-03-11 DIAGNOSIS — E785 Hyperlipidemia, unspecified: Secondary | ICD-10-CM

## 2018-03-11 DIAGNOSIS — Z951 Presence of aortocoronary bypass graft: Secondary | ICD-10-CM | POA: Diagnosis not present

## 2018-03-11 DIAGNOSIS — I4891 Unspecified atrial fibrillation: Secondary | ICD-10-CM

## 2018-03-11 DIAGNOSIS — Z952 Presence of prosthetic heart valve: Secondary | ICD-10-CM | POA: Diagnosis not present

## 2018-03-11 LAB — BASIC METABOLIC PANEL
Anion gap: 10 (ref 5–15)
BUN: 46 mg/dL — ABNORMAL HIGH (ref 6–20)
CO2: 21 mmol/L — ABNORMAL LOW (ref 22–32)
Calcium: 8.8 mg/dL — ABNORMAL LOW (ref 8.9–10.3)
Chloride: 107 mmol/L (ref 101–111)
Creatinine, Ser: 1.08 mg/dL (ref 0.61–1.24)
GFR calc Af Amer: >60 mL/min (ref >60)
GFR calc non Af Amer: >60 mL/min (ref >60)
Glucose, Bld: 95 mg/dL (ref 65–99)
Potassium: 4.6 mmol/L (ref 3.5–5.1)
Sodium: 138 mmol/L (ref 135–145)

## 2018-03-11 NOTE — Patient Instructions (Signed)
Medication Instructions:  Your physician recommends that you continue on your current medications as directed. Please refer to the Current Medication list given to you today.   Labwork: Your physician recommends that you return for lab work in: TODAY (BMET). - Please go to the [facility]. You will check in at the front desk to the right as you walk into the atrium. Valet Parking is offered if needed.    Testing/Procedures: none  Follow-Up: Your physician recommends that you schedule a follow-up appointment in: 1 MONTH WITH DR ARIDA.    If you need a refill on your cardiac medications before your next appointment, please call your pharmacy.

## 2018-03-11 NOTE — Progress Notes (Signed)
Cardiology Office Note Date:  03/11/2018  Patient ID:  Harold Parker, Harold Parker [birth year], MRN [id number] PCP:  System, Pcp Not In  Cardiologist:  Dr. Kirke Corin, MD    Chief Complaint: Hospital follow up  History of Present Illness: Harold Parker is a 72 y.o. 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   He has known CAD s/p MI in 1998 with BMS followed by repeat LHC several month later for recurrent chest pain with repeat intervention and possible rotablator per the patient. He has had regular follow up in [place] with mutliple stress tests over the years. He was in [place] visiting his girlfriend in 01/2018 when he noting fever, fatigue, dyspnea, cough and wheezing. He was initially seen at an outside urgent care and given inhalers/prednisone. His symptoms did not improve, prompting him to go to the ED where he tested positive for influenza A. He was prescribed Tamiflu, Tussionex, and Duoneb and discharged home. He never filled the Tamiflu. On 02/05/18 he developed sudden onset of dyspnea. BP was noted to be elevated at > 200 mmHg by EMS. Upon his arrival to the ED EKG showed sinus tachycardia, subtle ST depression in V5/V6. CXR showed pulmonary edema. Initial troponin 0.36 that peaked at 2.65. LHC revealed severe distal left main stenosis along with significant OM2 disease, CTO of the RCA with left-to-right collaterals. Echo showed mild LV dysfunction with an EF of 40-45% and moderate aortic stenosis. He was transferred to [facility] for evaluation by CT surgery. He was medically stablized at [facility] and underwent successful bioprosthetic AVR with an Edwards Inspiris Resilia stented bovine pericardial tissue valve (25 mm) and 3-vessel CABG with LIMA to distal LAD, SVG to D1, SVG to OM on 02/19/2018.  Post-operatively, he developed Afib which was treated with IV amiodarone with conversion to sinus rhythm. Otherwise, his post-operative course was uncomplicated. He was discharged on ASA 81 mg, Plavix 75 mg, Zetia 10 mg, folic acid 400 mcg, Lasix 40 mg bid x 3 days followed by 40 mg daily thereafter, glipizide 5 mg, losartan 100 mg, metformin 1000 mg bid, Lopressor 50 mg bid, oxycodone 5 mg, KCl 20 mEq bid x 3 days then daily thereafter, vitamin C 1000 mg, vitamin D, along with his inhaler. Discharge labs showed a BUN/SCr 20/0.9, K+ 3.9, glucose 112, magnesium 2.1, WBC 10.6, HGB 10, PLT 283.    He comes in doing well today.  He continues to advance activities as tolerated.  He has noted some intermittent weakness which she feels like was in the setting of dehydration as he was not drinking much water.  He has since increased his water intake with improvement in overall stamina.  Does note some soreness along the midline anterior chest wall with sudden movements.  Otherwise, he has been without chest pain, shortness of breath, nausea, vomiting, diaphoresis, palpitations, dizziness, presyncope, or syncope.  He also notes 3 surgical sutures along his abdomen.  He has been tapering his narcotic pain medication usage though does request a refill at this time.  He also reports he would like to restart his nighttime as needed Xanax.  He remains on supplemental oxygen at nighttime with readings typically in the low to mid 90s.  He is not using oxygen during the day.  Blood pressure remains well  controlled.  He is tolerating dual antiplatelet therapy without issues.  Has a follow-up with CT surgery in early April.  He would like to resume his work in [place] in June if possible.   Past Medical History:  Diagnosis Date  . Bilateral carotid bruits    a. 01/2018 U/S: < 50% bilat ICA stenosis.  Marland Kitchen CAD (coronary artery disease)    a. 1998 s/p MI and BMS [facility], [place]); b. 1999 redo PCI/rotablator in setting of what  sounds like ISR;  c. Multiple stress tests over the years - last ~ 2017, reportedly nl; d. 01/2018 NSTEMI/Cath: LM 43m/d, LAD 50p, 40p/m, D1 60ost, OM1 95, RCA 100ost/p w/ L->R collats, EF 45%; e. s/p 3V CABG 02/19/18 (LIMA-LAD, VG-D1, VG-OM)  . Chronic lower back pain   . COPD (chronic obstructive pulmonary disease) (HCC)   . HTN (hypertension)   . Hypercholesteremia   . Ischemic cardiomyopathy    a. 01/2018 Echo: EF 40-45%, mid-apicalanteroseptal, ant, apical sev HK, mod apicalinferior HK. Gr2 DD. Mod AS, mild MR, mod dil LA, PASP .  . Moderate aortic stenosis    a. 01/2018 Echo: Mod AS, mean grad (S) , Valve area (VTI) 1.06 cm^2, (Vmax) 1.27 cm^2; b. 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 Myocardial infarction (HCC) ~ 1998/1999  . 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 Tobacco abuse   . Type II diabetes mellitus (HCC)     Past Surgical History:  Procedure Laterality Date  . AORTIC VALVE REPLACEMENT N/A 02/19/2018   Procedure: AORTIC VALVE REPLACEMENT (AVR);  Surgeon: Purcell Nails, MD;  Location: [facility] OR;  Service: Open Heart Surgery;  Laterality: N/A;  . COLONOSCOPY    . CORONARY ANGIOPLASTY WITH STENT PLACEMENT  ~ 1998/1999  . CORONARY ARTERY BYPASS GRAFT N/A 02/19/2018   Procedure: CORONARY ARTERY BYPASS GRAFTING (CABG) x three , using left internal mammary artery and right leg greater saphenous vein harvested endoscopically;  Surgeon: Purcell Nails, MD;  Location: [facility] OR;  Service: Open Heart Surgery;  Laterality: N/A;  . LEFT HEART CATH AND CORONARY ANGIOGRAPHY N/A 02/06/2018   Procedure: LEFT HEART CATH AND CORONARY ANGIOGRAPHY;  Surgeon: Iran Ouch, MD;  Location: [facility] INVASIVE CV LAB;  Service: Cardiovascular;  Laterality: N/A;  . TEE WITHOUT CARDIOVERSION N/A 02/19/2018   Procedure:  TRANSESOPHAGEAL ECHOCARDIOGRAM (TEE);  Surgeon: Purcell Nails, MD;  Location: [facility] OR;  Service: Open Heart Surgery;  Laterality: N/A;  . TONSILLECTOMY      Current Meds  Medication Sig  . Ascorbic Acid (VITAMIN C) 1000 MG tablet Take 1,000 mg by mouth daily.  Marland Kitchen aspirin 81 MG EC tablet Take 1 tablet (81 mg total) by mouth daily.  Marland Kitchen atorvastatin (LIPITOR) 80 MG tablet Take 1 tablet (80 mg total) by mouth daily at 6 PM.  . clopidogrel (PLAVIX) 75 MG tablet Take 1 tablet (75 mg total) by mouth daily.  Marland Kitchen ezetimibe (ZETIA) 10 MG tablet Take 10 mg by mouth daily.  . folic acid (FOLVITE) 400 MCG tablet Take 400 mcg by mouth every evening.  . furosemide (LASIX) 40 MG tablet Please take one 40mg  Tab BID (twice a day) for 3 days then take one 40mg  tab once a day until we see you in follow-up.  Marland Kitchen glipiZIDE (  GLUCOTROL) 5 MG tablet Take 0.5 tablets (2.5 mg total) by mouth daily before breakfast.  . ipratropium-albuterol (DUONEB) 0.5-2.5 (3) MG/3ML SOLN Take 3 mLs by nebulization every 6 (six) hours as needed.  Marland Kitchen losartan (COZAAR) 100 MG tablet Take 1 tablet (100 mg total) by mouth daily.  . metFORMIN (GLUCOPHAGE) 1000 MG tablet Take 1 tablet (1,000 mg total) by mouth 2 (two) times daily with a meal.  . metoprolol tartrate (LOPRESSOR) 50 MG tablet Take 1 tablet (50 mg total) by mouth 2 (two) times daily.  Marland Kitchen oxyCODONE (OXY IR/ROXICODONE) 5 MG immediate release tablet Take 1 tablet (5 mg total) by mouth every 6 (six) hours as needed for severe pain.  . potassium chloride 20 MEQ TBCR Please take one tab twice a day for three days then take one tab daily until we see you in follow-up.  Marland Kitchen VITAMIN D, CHOLECALCIFEROL, PO Take by mouth every morning.    Allergies:   Patient has no known allergies.   Social History:  The patient  reports that he quit smoking about 5 weeks ago. His smoking use included cigarettes. He has a 15.00 pack-year smoking history. He has never used smokeless tobacco. He reports that he  does not drink alcohol or use drugs.   Family History:  The patient's family history includes Lymphoma in his mother; Peripheral vascular disease in his father.  ROS:   Review of Systems  Constitutional: Positive for malaise/fatigue. Negative for chills, diaphoresis, fever and weight loss.       Improving  HENT: Negative for congestion.   Eyes: Negative for discharge and redness.  Respiratory: Negative for cough, hemoptysis, sputum production, shortness of breath and wheezing.   Cardiovascular: Positive for chest pain. Negative for palpitations, orthopnea, claudication, leg swelling and PND.  Gastrointestinal: Negative for abdominal pain, blood in stool, heartburn, melena, nausea and vomiting.  Genitourinary: Negative for hematuria.  Musculoskeletal: Negative for falls and myalgias.  Skin: Negative for rash.  Neurological: Positive for weakness. Negative for dizziness, tingling, tremors, sensory change, speech change, focal weakness and loss of consciousness.       Improving  Endo/Heme/Allergies: Does not bruise/bleed easily.  Psychiatric/Behavioral: Negative for substance abuse. The patient is not nervous/anxious.   All other systems reviewed and are negative.    PHYSICAL EXAM:  VS:  BP (!) 110/58 (BP Location: Left Arm, Patient Position: Sitting, Cuff Size: Normal)   Pulse 88   Ht 5\' 6"  (1.676 m)   Wt 160 lb 8 oz (72.8 kg)   BMI 25.91 kg/m  BMI: Body mass index is 25.91 kg/m.  Physical Exam  Constitutional: He is oriented to person, place, and time. He appears well-developed and well-nourished.  HENT:  Head: Normocephalic and atraumatic.  Eyes: Right eye exhibits no discharge. Left eye exhibits no discharge.  Neck: Normal range of motion. No JVD present.  Cardiovascular: Normal rate, regular rhythm, S1 normal and S2 normal. Exam reveals no distant heart sounds, no friction rub, no midsystolic click and no opening snap.  Murmur heard.  Harsh midsystolic murmur is present with  a grade of 1/6 at the upper right sternal border radiating to the neck. Pulses:      Posterior tibial pulses are 2+ on the right side, and 2+ on the left side.  Well healing midline surgical wound without surrounding erythema, warmth, or discharge. 3 surgical sutures notes along the abdomen.   Pulmonary/Chest: Effort normal and breath sounds normal. No respiratory distress. He has no decreased breath sounds. He  has no wheezes. He has no rales. He exhibits no tenderness.  Abdominal: Soft. He exhibits no distension. There is no tenderness.  Musculoskeletal: He exhibits no edema.  Neurological: He is alert and oriented to person, place, and time.  Skin: Skin is warm and dry. No cyanosis. Nails show no clubbing.  Psychiatric: He has a normal mood and affect. His speech is normal and behavior is normal. Judgment and thought content normal.     EKG:  Was ordered and interpreted by me today. Shows NSR, 88 bpm, inferior Q waves, TWI leads I, II, III, aVF, aVL, V3-V6  Recent Labs: 02/18/2018: ALT 40; B Natriuretic Peptide 167.8 02/25/2018: Hemoglobin 10.0; Magnesium 2.1; Platelets 283; TSH 1.408 02/26/2018: BUN 20; Creatinine, Ser 0.90; Potassium 3.9; Sodium 135  02/06/2018: Cholesterol 139; HDL 42; LDL Cholesterol 74; Total CHOL/HDL Ratio 3.3; Triglycerides 116; VLDL 23   Estimated Creatinine Clearance: 66 mL/min (by C-G formula based on SCr of 0.9 mg/dL).   Wt Readings from Last 3 Encounters:  03/11/18 160 lb 8 oz (72.8 kg)  02/26/18 169 lb 9.6 oz (76.9 kg)  02/07/18 167 lb 11.2 oz (76.1 kg)     Other studies reviewed: Additional studies/records reviewed today include: summarized above  ASSESSMENT AND PLAN:  1. CAD status post three-vessel CABG: Doing well.  No symptoms concerning for angina at this time.  Has follow-up with CT surgery in April.  Continue surgical restrictions per CT surgery until evaluated by them next month.  Continue dual antiplatelet therapy with aspirin 81 mg and Plavix 75  mg daily.  He plans to enroll in cardiac rehab following this appointment.  I advised the patient to contact the surgeon's office regarding the sutures that remain along his abdomen.  I also advised the patient he would need to contact his surgeon's office for a potential refill of his oxycodone.  Continue restrictions per surgery.  I did fill out disability paperwork for him though left his return to work date as pending per CT surgery.  Aggressive risk factor modification with secondary prevention.  2. Chronic systolic CHF due to ischemic cardiomyopathy: He does not appear grossly volume overloaded at this time.  Continue Lopressor 50 mg twice daily, losartan 100 mg daily, Lasix 40 mg daily, KCl 20 mEq daily.  Check BMP.  CHF education.  3. Aortic stenosis status post bioprosthetic aortic valve replacement: Doing well.  Consider echocardiogram at follow-up.  4. Postoperative atrial fibrillation: Remains in sinus rhythm at this time.  Not on antiarrhythmic therapy.  Would not start anticoagulation at this time unless there is a recurrence of arrhythmia.  Continue to monitor.  5. Chronic respiratory failure with hypoxia secondary to COPD: Stable.  Continue nighttime oxygen as directed by prescriber.  Consider establishing with pulmonology.  6. HLD: Continue Lipitor and Zetia.  Plan for follow-up fasting lipid and liver function in approximately 8 weeks.  Disposition: F/u with Dr. Kirke Corin, MD in 1 month.   Current medicines are reviewed at length with the patient today.  The patient did not have any concerns regarding medicines.  Signed, Eula Listen, PA-C 03/11/2018 3:39 PM     [facility] [address] [place], [place] [address] [phone]

## 2018-03-12 ENCOUNTER — Other Ambulatory Visit: Payer: Self-pay

## 2018-03-12 DIAGNOSIS — G8918 Other acute postprocedural pain: Secondary | ICD-10-CM

## 2018-03-12 MED ORDER — TRAMADOL HCL 50 MG PO TABS: 50.0000 mg | ORAL_TABLET | Freq: Four times a day (QID) | ORAL | 0 refills | Status: DC | PRN

## 2018-03-12 NOTE — Telephone Encounter (Signed)
Tramadol 50 mg RX faxed to [facility]. Patient is aware.

## 2018-03-13 ENCOUNTER — Inpatient Hospital Stay
Admission: EM | Admit: 2018-03-13 | Discharge: 2018-03-18 | DRG: 377 | Disposition: A | Discharge status: Home Health | Payer: Medicare Other | Attending: Internal Medicine | Admitting: Internal Medicine

## 2018-03-13 ENCOUNTER — Encounter: Payer: Self-pay | Admitting: Emergency Medicine

## 2018-03-13 DIAGNOSIS — I255 Ischemic cardiomyopathy: Secondary | ICD-10-CM | POA: Diagnosis present

## 2018-03-13 DIAGNOSIS — D62 Acute posthemorrhagic anemia: Secondary | ICD-10-CM | POA: Diagnosis present

## 2018-03-13 DIAGNOSIS — I21A1 Myocardial infarction type 2: Secondary | ICD-10-CM | POA: Diagnosis present

## 2018-03-13 DIAGNOSIS — Z7982 Long term (current) use of aspirin: Secondary | ICD-10-CM

## 2018-03-13 DIAGNOSIS — Z955 Presence of coronary angioplasty implant and graft: Secondary | ICD-10-CM

## 2018-03-13 DIAGNOSIS — I6789 Other cerebrovascular disease: Secondary | ICD-10-CM | POA: Diagnosis not present

## 2018-03-13 DIAGNOSIS — Z7984 Long term (current) use of oral hypoglycemic drugs: Secondary | ICD-10-CM

## 2018-03-13 DIAGNOSIS — Z87891 Personal history of nicotine dependence: Secondary | ICD-10-CM

## 2018-03-13 DIAGNOSIS — I5022 Chronic systolic (congestive) heart failure: Secondary | ICD-10-CM | POA: Diagnosis not present

## 2018-03-13 DIAGNOSIS — K269 Duodenal ulcer, unspecified as acute or chronic, without hemorrhage or perforation: Secondary | ICD-10-CM

## 2018-03-13 DIAGNOSIS — R0602 Shortness of breath: Secondary | ICD-10-CM | POA: Diagnosis not present

## 2018-03-13 DIAGNOSIS — I2510 Atherosclerotic heart disease of native coronary artery without angina pectoris: Secondary | ICD-10-CM | POA: Diagnosis present

## 2018-03-13 DIAGNOSIS — R778 Other specified abnormalities of plasma proteins: Secondary | ICD-10-CM

## 2018-03-13 DIAGNOSIS — J9611 Chronic respiratory failure with hypoxia: Secondary | ICD-10-CM | POA: Diagnosis present

## 2018-03-13 DIAGNOSIS — I2582 Chronic total occlusion of coronary artery: Secondary | ICD-10-CM | POA: Diagnosis present

## 2018-03-13 DIAGNOSIS — R7989 Other specified abnormal findings of blood chemistry: Secondary | ICD-10-CM

## 2018-03-13 DIAGNOSIS — R202 Paresthesia of skin: Secondary | ICD-10-CM | POA: Diagnosis not present

## 2018-03-13 DIAGNOSIS — Z953 Presence of xenogenic heart valve: Secondary | ICD-10-CM

## 2018-03-13 DIAGNOSIS — K921 Melena: Principal | ICD-10-CM

## 2018-03-13 DIAGNOSIS — D649 Anemia, unspecified: Secondary | ICD-10-CM | POA: Diagnosis not present

## 2018-03-13 DIAGNOSIS — K922 Gastrointestinal hemorrhage, unspecified: Secondary | ICD-10-CM | POA: Diagnosis not present

## 2018-03-13 DIAGNOSIS — F419 Anxiety disorder, unspecified: Secondary | ICD-10-CM | POA: Diagnosis present

## 2018-03-13 DIAGNOSIS — E785 Hyperlipidemia, unspecified: Secondary | ICD-10-CM | POA: Diagnosis present

## 2018-03-13 DIAGNOSIS — J449 Chronic obstructive pulmonary disease, unspecified: Secondary | ICD-10-CM | POA: Diagnosis present

## 2018-03-13 DIAGNOSIS — K449 Diaphragmatic hernia without obstruction or gangrene: Secondary | ICD-10-CM | POA: Diagnosis present

## 2018-03-13 DIAGNOSIS — I252 Old myocardial infarction: Secondary | ICD-10-CM

## 2018-03-13 DIAGNOSIS — Z951 Presence of aortocoronary bypass graft: Secondary | ICD-10-CM

## 2018-03-13 DIAGNOSIS — Z79899 Other long term (current) drug therapy: Secondary | ICD-10-CM

## 2018-03-13 DIAGNOSIS — E119 Type 2 diabetes mellitus without complications: Secondary | ICD-10-CM | POA: Diagnosis present

## 2018-03-13 DIAGNOSIS — Z7902 Long term (current) use of antithrombotics/antiplatelets: Secondary | ICD-10-CM

## 2018-03-13 DIAGNOSIS — I110 Hypertensive heart disease with heart failure: Secondary | ICD-10-CM | POA: Diagnosis present

## 2018-03-13 MED ORDER — LORAZEPAM 2 MG/ML IJ SOLN
0.5000 mg | Freq: Once | INTRAMUSCULAR | Status: AC
  Administered 2018-03-13: 0.5 mg via INTRAVENOUS
  Filled 2018-03-13: qty 1

## 2018-03-13 NOTE — ED Triage Notes (Signed)
Pt arrived via EMS from home where EMS reports pt had triple bypass surgery x2 weeks ago and was taken off of oxycodone as of yesterday (Wednesday,) today pt taking tramadol. Pt at home says, "Im scared that the pain is going to start." EMS reports pt was hyperventilating on arrival and feeling anxious. Pt is A&O x4 on arrival, denies pain and has RR of 21.

## 2018-03-14 ENCOUNTER — Emergency Department: Payer: Medicare Other

## 2018-03-14 ENCOUNTER — Other Ambulatory Visit: Payer: Self-pay

## 2018-03-14 DIAGNOSIS — D649 Anemia, unspecified: Secondary | ICD-10-CM | POA: Diagnosis not present

## 2018-03-14 DIAGNOSIS — D62 Acute posthemorrhagic anemia: Secondary | ICD-10-CM | POA: Diagnosis not present

## 2018-03-14 DIAGNOSIS — I214 Non-ST elevation (NSTEMI) myocardial infarction: Secondary | ICD-10-CM | POA: Diagnosis not present

## 2018-03-14 DIAGNOSIS — Z955 Presence of coronary angioplasty implant and graft: Secondary | ICD-10-CM | POA: Diagnosis not present

## 2018-03-14 DIAGNOSIS — Z951 Presence of aortocoronary bypass graft: Secondary | ICD-10-CM | POA: Diagnosis not present

## 2018-03-14 DIAGNOSIS — K449 Diaphragmatic hernia without obstruction or gangrene: Secondary | ICD-10-CM | POA: Diagnosis not present

## 2018-03-14 DIAGNOSIS — E119 Type 2 diabetes mellitus without complications: Secondary | ICD-10-CM | POA: Diagnosis not present

## 2018-03-14 DIAGNOSIS — I25118 Atherosclerotic heart disease of native coronary artery with other forms of angina pectoris: Secondary | ICD-10-CM | POA: Diagnosis not present

## 2018-03-14 DIAGNOSIS — I5022 Chronic systolic (congestive) heart failure: Secondary | ICD-10-CM | POA: Diagnosis not present

## 2018-03-14 DIAGNOSIS — E785 Hyperlipidemia, unspecified: Secondary | ICD-10-CM | POA: Diagnosis present

## 2018-03-14 DIAGNOSIS — K922 Gastrointestinal hemorrhage, unspecified: Secondary | ICD-10-CM

## 2018-03-14 DIAGNOSIS — I2582 Chronic total occlusion of coronary artery: Secondary | ICD-10-CM | POA: Diagnosis present

## 2018-03-14 DIAGNOSIS — J449 Chronic obstructive pulmonary disease, unspecified: Secondary | ICD-10-CM | POA: Diagnosis not present

## 2018-03-14 DIAGNOSIS — Z952 Presence of prosthetic heart valve: Secondary | ICD-10-CM | POA: Diagnosis not present

## 2018-03-14 DIAGNOSIS — Z7982 Long term (current) use of aspirin: Secondary | ICD-10-CM | POA: Diagnosis not present

## 2018-03-14 DIAGNOSIS — I21A1 Myocardial infarction type 2: Secondary | ICD-10-CM | POA: Diagnosis present

## 2018-03-14 DIAGNOSIS — I10 Essential (primary) hypertension: Secondary | ICD-10-CM | POA: Diagnosis not present

## 2018-03-14 DIAGNOSIS — F419 Anxiety disorder, unspecified: Secondary | ICD-10-CM | POA: Diagnosis present

## 2018-03-14 DIAGNOSIS — I252 Old myocardial infarction: Secondary | ICD-10-CM | POA: Diagnosis not present

## 2018-03-14 DIAGNOSIS — K2950 Unspecified chronic gastritis without bleeding: Secondary | ICD-10-CM | POA: Diagnosis not present

## 2018-03-14 DIAGNOSIS — I248 Other forms of acute ischemic heart disease: Secondary | ICD-10-CM | POA: Diagnosis not present

## 2018-03-14 DIAGNOSIS — Z7984 Long term (current) use of oral hypoglycemic drugs: Secondary | ICD-10-CM | POA: Diagnosis not present

## 2018-03-14 DIAGNOSIS — Z953 Presence of xenogenic heart valve: Secondary | ICD-10-CM | POA: Diagnosis not present

## 2018-03-14 DIAGNOSIS — I110 Hypertensive heart disease with heart failure: Secondary | ICD-10-CM | POA: Diagnosis present

## 2018-03-14 DIAGNOSIS — Z79899 Other long term (current) drug therapy: Secondary | ICD-10-CM | POA: Diagnosis not present

## 2018-03-14 DIAGNOSIS — R748 Abnormal levels of other serum enzymes: Secondary | ICD-10-CM | POA: Diagnosis not present

## 2018-03-14 DIAGNOSIS — I255 Ischemic cardiomyopathy: Secondary | ICD-10-CM | POA: Diagnosis present

## 2018-03-14 DIAGNOSIS — J9611 Chronic respiratory failure with hypoxia: Secondary | ICD-10-CM | POA: Diagnosis present

## 2018-03-14 DIAGNOSIS — Z87891 Personal history of nicotine dependence: Secondary | ICD-10-CM | POA: Diagnosis not present

## 2018-03-14 DIAGNOSIS — K269 Duodenal ulcer, unspecified as acute or chronic, without hemorrhage or perforation: Secondary | ICD-10-CM | POA: Diagnosis not present

## 2018-03-14 DIAGNOSIS — R0602 Shortness of breath: Secondary | ICD-10-CM | POA: Diagnosis not present

## 2018-03-14 DIAGNOSIS — I2510 Atherosclerotic heart disease of native coronary artery without angina pectoris: Secondary | ICD-10-CM | POA: Diagnosis not present

## 2018-03-14 DIAGNOSIS — Z7902 Long term (current) use of antithrombotics/antiplatelets: Secondary | ICD-10-CM | POA: Diagnosis not present

## 2018-03-14 DIAGNOSIS — K921 Melena: Secondary | ICD-10-CM | POA: Diagnosis not present

## 2018-03-14 LAB — BASIC METABOLIC PANEL
Anion gap: 18 — ABNORMAL HIGH (ref 5–15)
Anion gap: 8 (ref 5–15)
BUN: 61 mg/dL — ABNORMAL HIGH (ref 6–20)
BUN: 65 mg/dL — ABNORMAL HIGH (ref 6–20)
CO2: 13 mmol/L — ABNORMAL LOW (ref 22–32)
CO2: 23 mmol/L (ref 22–32)
Calcium: 8.8 mg/dL — ABNORMAL LOW (ref 8.9–10.3)
Calcium: 8.9 mg/dL (ref 8.9–10.3)
Chloride: 103 mmol/L (ref 101–111)
Chloride: 106 mmol/L (ref 101–111)
Creatinine, Ser: 1 mg/dL (ref 0.61–1.24)
Creatinine, Ser: 1.19 mg/dL (ref 0.61–1.24)
GFR calc Af Amer: >60 mL/min (ref >60)
GFR calc Af Amer: >60 mL/min (ref >60)
GFR calc non Af Amer: 59 mL/min — ABNORMAL LOW (ref >60)
GFR calc non Af Amer: >60 mL/min (ref >60)
Glucose, Bld: 164 mg/dL — ABNORMAL HIGH (ref 65–99)
Glucose, Bld: 267 mg/dL — ABNORMAL HIGH (ref 65–99)
Potassium: 4.9 mmol/L (ref 3.5–5.1)
Potassium: 5.2 mmol/L — ABNORMAL HIGH (ref 3.5–5.1)
Sodium: 134 mmol/L — ABNORMAL LOW (ref 135–145)
Sodium: 137 mmol/L (ref 135–145)

## 2018-03-14 LAB — CBC WITH DIFFERENTIAL/PLATELET
Basophils Absolute: 0.2 10*3/uL — ABNORMAL HIGH (ref 0–0.1)
Basophils Relative: 1 %
Eosinophils Absolute: 0.3 10*3/uL (ref 0–0.7)
Eosinophils Relative: 2 %
HCT: 15.3 % — ABNORMAL LOW (ref 40.0–52.0)
Hemoglobin: 4.7 g/dL — CL (ref 13.0–18.0)
Lymphocytes Relative: 16 %
Lymphs Abs: 2.8 10*3/uL (ref 1.0–3.6)
MCH: 30.5 pg (ref 26.0–34.0)
MCHC: 30.7 g/dL — ABNORMAL LOW (ref 32.0–36.0)
MCV: 99.4 fL (ref 80.0–100.0)
Monocytes Absolute: 1.2 10*3/uL — ABNORMAL HIGH (ref 0.2–1.0)
Monocytes Relative: 7 %
Neutro Abs: 12.8 10*3/uL — ABNORMAL HIGH (ref 1.4–6.5)
Neutrophils Relative %: 74 %
Platelets: 487 10*3/uL — ABNORMAL HIGH (ref 150–440)
RBC: 1.54 MIL/uL — ABNORMAL LOW (ref 4.40–5.90)
RDW: 18.6 % — ABNORMAL HIGH (ref 11.5–14.5)
WBC: 17.3 10*3/uL — ABNORMAL HIGH (ref 3.8–10.6)

## 2018-03-14 LAB — GLUCOSE, CAPILLARY
Glucose-Capillary: 183 mg/dL — ABNORMAL HIGH (ref 65–99)
Glucose-Capillary: 151 mg/dL — ABNORMAL HIGH (ref 65–99)
Glucose-Capillary: 133 mg/dL — ABNORMAL HIGH (ref 65–99)
Glucose-Capillary: 189 mg/dL — ABNORMAL HIGH (ref 65–99)
Glucose-Capillary: 181 mg/dL — ABNORMAL HIGH (ref 65–99)

## 2018-03-14 LAB — CBC
HCT: 18 % — ABNORMAL LOW (ref 40.0–52.0)
Hemoglobin: 5.9 g/dL — ABNORMAL LOW (ref 13.0–18.0)
MCH: 30.4 pg (ref 26.0–34.0)
MCHC: 32.8 g/dL (ref 32.0–36.0)
MCV: 92.6 fL (ref 80.0–100.0)
Platelets: 384 10*3/uL (ref 150–440)
RBC: 1.94 MIL/uL — ABNORMAL LOW (ref 4.40–5.90)
RDW: 16.2 % — ABNORMAL HIGH (ref 11.5–14.5)
WBC: 13.9 10*3/uL — ABNORMAL HIGH (ref 3.8–10.6)

## 2018-03-14 LAB — PREPARE RBC (CROSSMATCH)

## 2018-03-14 LAB — HEMOGLOBIN AND HEMATOCRIT, BLOOD
HCT: 20.3 % — ABNORMAL LOW (ref 40.0–52.0)
Hemoglobin: 6.9 g/dL — ABNORMAL LOW (ref 13.0–18.0)

## 2018-03-14 LAB — TROPONIN I
Troponin I: 0.42 ng/mL (ref <0.03)
Troponin I: 8.1 ng/mL (ref <0.03)

## 2018-03-14 LAB — ABO/RH: ABO/RH(D): O NEG

## 2018-03-14 MED ORDER — PANTOPRAZOLE SODIUM 40 MG PO TBEC: 40.0000 mg | DELAYED_RELEASE_TABLET | Freq: Every day | ORAL | Status: DC

## 2018-03-14 MED ORDER — ONDANSETRON HCL 4 MG/2ML IJ SOLN: 4.0000 mg | Freq: Four times a day (QID) | INTRAMUSCULAR | Status: DC | PRN

## 2018-03-14 MED ORDER — SODIUM CHLORIDE 0.9 % IV SOLN
Freq: Once | INTRAVENOUS | Status: AC
  Administered 2018-03-14: 17:00:00 via INTRAVENOUS

## 2018-03-14 MED ORDER — BISACODYL 5 MG PO TBEC: 5.0000 mg | DELAYED_RELEASE_TABLET | Freq: Every day | ORAL | Status: DC | PRN

## 2018-03-14 MED ORDER — METOPROLOL TARTRATE 50 MG PO TABS
50.0000 mg | ORAL_TABLET | Freq: Two times a day (BID) | ORAL | Status: DC
  Administered 2018-03-14 – 2018-03-18 (×8): 50 mg via ORAL
  Filled 2018-03-14 (×9): qty 1

## 2018-03-14 MED ORDER — ACETAMINOPHEN 325 MG PO TABS: 650.0000 mg | ORAL_TABLET | Freq: Four times a day (QID) | ORAL | Status: DC | PRN

## 2018-03-14 MED ORDER — ACETAMINOPHEN 650 MG RE SUPP: 650.0000 mg | Freq: Four times a day (QID) | RECTAL | Status: DC | PRN

## 2018-03-14 MED ORDER — ONDANSETRON HCL 4 MG PO TABS: 4.0000 mg | ORAL_TABLET | Freq: Four times a day (QID) | ORAL | Status: DC | PRN

## 2018-03-14 MED ORDER — VITAMIN C 500 MG PO TABS
1000.0000 mg | ORAL_TABLET | Freq: Every day | ORAL | Status: DC
  Administered 2018-03-14 – 2018-03-17 (×4): 1000 mg via ORAL
  Filled 2018-03-14 (×5): qty 2

## 2018-03-14 MED ORDER — IPRATROPIUM-ALBUTEROL 0.5-2.5 (3) MG/3ML IN SOLN
3.0000 mL | Freq: Four times a day (QID) | RESPIRATORY_TRACT | Status: DC | PRN
  Administered 2018-03-17: 3 mL via RESPIRATORY_TRACT
  Filled 2018-03-14 (×2): qty 3

## 2018-03-14 MED ORDER — ATORVASTATIN CALCIUM 20 MG PO TABS
80.0000 mg | ORAL_TABLET | Freq: Every day | ORAL | Status: DC
  Administered 2018-03-14 – 2018-03-17 (×4): 80 mg via ORAL
  Filled 2018-03-14 (×4): qty 4

## 2018-03-14 MED ORDER — FUROSEMIDE 10 MG/ML IJ SOLN
40.0000 mg | Freq: Once | INTRAMUSCULAR | Status: AC
  Administered 2018-03-14: 40 mg via INTRAVENOUS
  Filled 2018-03-14: qty 4

## 2018-03-14 MED ORDER — FUROSEMIDE 40 MG PO TABS
40.0000 mg | ORAL_TABLET | Freq: Every day | ORAL | Status: DC
  Administered 2018-03-14 – 2018-03-17 (×4): 40 mg via ORAL
  Filled 2018-03-14 (×5): qty 1

## 2018-03-14 MED ORDER — SODIUM CHLORIDE 0.9 % IV SOLN: 10.0000 mL/h | Freq: Once | INTRAVENOUS | Status: DC

## 2018-03-14 MED ORDER — ALPRAZOLAM 0.5 MG PO TABS
0.5000 mg | ORAL_TABLET | Freq: Every evening | ORAL | Status: DC | PRN
  Administered 2018-03-14 – 2018-03-17 (×4): 0.5 mg via ORAL
  Filled 2018-03-14 (×4): qty 1

## 2018-03-14 MED ORDER — DOCUSATE SODIUM 100 MG PO CAPS
100.0000 mg | ORAL_CAPSULE | Freq: Two times a day (BID) | ORAL | Status: DC
  Administered 2018-03-14 – 2018-03-17 (×7): 100 mg via ORAL
  Filled 2018-03-14 (×9): qty 1

## 2018-03-14 MED ORDER — TRAZODONE HCL 50 MG PO TABS
25.0000 mg | ORAL_TABLET | Freq: Every evening | ORAL | Status: DC | PRN
  Administered 2018-03-14: 25 mg via ORAL
  Filled 2018-03-14: qty 1

## 2018-03-14 MED ORDER — INSULIN ASPART 100 UNIT/ML ~~LOC~~ SOLN
0.0000 [IU] | Freq: Three times a day (TID) | SUBCUTANEOUS | Status: DC
  Administered 2018-03-14: 2 [IU] via SUBCUTANEOUS
  Administered 2018-03-14: 1 [IU] via SUBCUTANEOUS
  Administered 2018-03-14: 2 [IU] via SUBCUTANEOUS
  Administered 2018-03-15: 3 [IU] via SUBCUTANEOUS
  Administered 2018-03-15 – 2018-03-17 (×7): 2 [IU] via SUBCUTANEOUS
  Administered 2018-03-17 – 2018-03-18 (×2): 1 [IU] via SUBCUTANEOUS
  Administered 2018-03-18: 2 [IU] via SUBCUTANEOUS
  Filled 2018-03-14 (×12): qty 1

## 2018-03-14 MED ORDER — EZETIMIBE 10 MG PO TABS
10.0000 mg | ORAL_TABLET | Freq: Every day | ORAL | Status: DC
  Administered 2018-03-14 – 2018-03-17 (×4): 10 mg via ORAL
  Filled 2018-03-14 (×5): qty 1

## 2018-03-14 MED ORDER — HYDROCODONE-ACETAMINOPHEN 5-325 MG PO TABS
1.0000 | ORAL_TABLET | ORAL | Status: DC | PRN
  Administered 2018-03-14 – 2018-03-17 (×5): 2 via ORAL
  Filled 2018-03-14 (×5): qty 2

## 2018-03-14 MED ORDER — FOLIC ACID 0.5 MG HALF TAB
500.0000 ug | ORAL_TABLET | Freq: Every evening | ORAL | Status: DC
  Administered 2018-03-14 – 2018-03-17 (×4): 0.5 mg via ORAL
  Filled 2018-03-14 (×5): qty 1

## 2018-03-14 MED ORDER — PANTOPRAZOLE SODIUM 40 MG IV SOLR
40.0000 mg | Freq: Two times a day (BID) | INTRAVENOUS | Status: DC
  Administered 2018-03-14 – 2018-03-18 (×9): 40 mg via INTRAVENOUS
  Filled 2018-03-14 (×9): qty 40

## 2018-03-14 MED ORDER — INSULIN ASPART 100 UNIT/ML ~~LOC~~ SOLN
0.0000 [IU] | Freq: Every day | SUBCUTANEOUS | Status: DC
  Administered 2018-03-17: 2 [IU] via SUBCUTANEOUS
  Filled 2018-03-14: qty 1

## 2018-03-14 MED ORDER — TRAMADOL HCL 50 MG PO TABS
50.0000 mg | ORAL_TABLET | Freq: Four times a day (QID) | ORAL | Status: DC | PRN
  Administered 2018-03-14: 50 mg via ORAL
  Filled 2018-03-14: qty 1

## 2018-03-14 MED ORDER — LOSARTAN POTASSIUM 50 MG PO TABS
100.0000 mg | ORAL_TABLET | Freq: Every day | ORAL | Status: DC
  Administered 2018-03-15 – 2018-03-17 (×3): 100 mg via ORAL
  Filled 2018-03-14 (×5): qty 2

## 2018-03-14 MED ORDER — VITAMIN D 1000 UNITS PO TABS
1000.0000 [IU] | ORAL_TABLET | ORAL | Status: DC
  Administered 2018-03-14 – 2018-03-17 (×4): 1000 [IU] via ORAL
  Filled 2018-03-14 (×4): qty 1

## 2018-03-14 NOTE — Care Management Important Message (Signed)
Important Message  Patient Details  Name: Joby Wint MRN: [id number] Date of Birth: 1946/10/11   Medicare Important Message Given:  Yes  Signed IM notice given  Eber Hong, RN 03/14/2018, 12:25 PM

## 2018-03-14 NOTE — Consult Note (Signed)
Melodie Bouillon, MD [address], [address], [place], [place], [address] [address], [place], [place], [address] Phone: [phone]  Fax: [phone]  Consultation  Referring Provider:     Dr. Cherlynn Kaiser Primary Care Physician:  System, Pcp Not In Reason for Consultation:    Anemia  Date of Admission:  03/13/2018 Date of Consultation:  03/14/2018         HPI:   Harold Parker is a 72 y.o. male with history of CABG on February 19, 2018, presented with weakness at home, and found to have a hemoglobin of 4.7.  Patient also reports melena at home for the last 2 weeks, daily.  No nausea or vomiting.  Denies any previous history of melena.  Is on aspirin and Plavix at home, that he took yesterday.  Troponin is elevated to 8.  Denies any other NSAID use.  Denies abdominal pain.  Past Medical History:  Diagnosis Date  . Bilateral carotid bruits    a. 01/2018 U/S: < 50% bilat ICA stenosis.  Marland Kitchen CAD (coronary artery disease)    a. 1998 s/p MI and BMS [facility], [place]); b. 1999 redo PCI/rotablator in setting of what sounds like ISR;  c. Multiple stress tests over the years - last ~ 2017, reportedly nl; d. 01/2018 NSTEMI/Cath: LM 51m/d, LAD 50p, 40p/m, D1 60ost, OM1 95, RCA 100ost/p w/ L->R collats, EF 45%; e. s/p 3V CABG 02/19/18 (LIMA-LAD, VG-D1, VG-OM)  . Chronic lower back pain   . COPD (chronic obstructive pulmonary disease) (HCC)   . HTN (hypertension)   . Hypercholesteremia   . Ischemic cardiomyopathy    a. 01/2018 Echo: EF 40-45%, mid-apicalanteroseptal, ant, apical sev HK, mod apicalinferior HK. Gr2 DD. Mod AS, mild MR, mod dil LA, PASP .  . Moderate aortic stenosis    a. 01/2018 Echo: Mod AS, mean grad (S) , Valve area (VTI) 1.06 cm^2, (Vmax) 1.27 cm^2; b. 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 Myocardial infarction (HCC) ~ 1998/1999  . 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 Tobacco abuse   . Type II diabetes mellitus (HCC)     Past Surgical History:  Procedure Laterality Date  . AORTIC VALVE REPLACEMENT N/A 02/19/2018   Procedure: AORTIC VALVE REPLACEMENT (AVR);  Surgeon: Purcell Nails, MD;  Location: [facility] OR;  Service: Open Heart Surgery;  Laterality: N/A;  . COLONOSCOPY    . CORONARY ANGIOPLASTY WITH STENT PLACEMENT  ~ 1998/1999  . CORONARY ARTERY BYPASS GRAFT N/A 02/19/2018   Procedure: CORONARY ARTERY BYPASS GRAFTING (CABG) x three , using left internal mammary artery and right leg greater saphenous vein harvested endoscopically;  Surgeon: Purcell Nails, MD;  Location: [facility] OR;  Service: Open Heart Surgery;  Laterality: N/A;  . LEFT HEART CATH AND CORONARY ANGIOGRAPHY N/A 02/06/2018   Procedure: LEFT HEART CATH AND CORONARY ANGIOGRAPHY;  Surgeon: Iran Ouch, MD;  Location: [facility] INVASIVE CV LAB;  Service: Cardiovascular;  Laterality: N/A;  . TEE WITHOUT CARDIOVERSION N/A 02/19/2018   Procedure: TRANSESOPHAGEAL ECHOCARDIOGRAM (TEE);  Surgeon: Purcell Nails, MD;  Location: [facility] OR;  Service: Open Heart Surgery;  Laterality: N/A;  . TONSILLECTOMY      Prior to Admission medications   Medication Sig Start Date End Date Taking? Authorizing Provider  ALPRAZolam (XANAX) 0.5 MG tablet Take 0.5 mg by mouth at bedtime as needed for anxiety.   Yes [provider]  Ascorbic Acid (VITAMIN C) 1000 MG tablet Take 1,000 mg by mouth daily.   Yes [provider]  aspirin 81 MG EC tablet Take 1 tablet (81 mg total) by mouth daily. 02/27/18  Yes Asa Lente, Tessa N, PA-C  atorvastatin (LIPITOR) 80 MG tablet Take 1 tablet (80 mg total) by mouth daily at 6 PM. 02/26/18  Yes Asa Lente, Tessa N, PA-C  clopidogrel (PLAVIX) 75 MG tablet Take 1 tablet (75 mg total) by mouth daily. 02/27/18  Yes Conte, Tessa N, PA-C  ezetimibe (ZETIA) 10 MG tablet Take 10 mg by mouth daily.   Yes [provider]  folic acid (FOLVITE) 400 MCG tablet Take 400 mcg by mouth every evening.   Yes [provider]  furosemide (LASIX) 40 MG tablet Please take one 40mg  Tab BID (twice a day) for 3 days then take one 40mg  tab once a day until we see you in follow-up. Patient taking differently: Take 40 mg by mouth daily. Please take one 40mg  Tab BID (twice a day) for 3 days then take one 40mg  tab once a day until we see you in follow-up. 02/26/18  Yes Asa Lente, Tessa N, PA-C  glipiZIDE (GLUCOTROL) 5 MG tablet Take 0.5 tablets (2.5 mg total) by mouth daily before breakfast. 02/27/18  Yes Conte, Tessa N, PA-C  ipratropium-albuterol (DUONEB) 0.5-2.5 (3) MG/3ML SOLN Take 3 mLs by nebulization every 6 (six) hours as needed. 02/03/18  Yes Emily Filbert, MD  losartan (COZAAR) 100 MG tablet Take 1 tablet (100 mg total) by mouth daily. 02/27/18  Yes Iran Ouch, MD  metFORMIN (GLUCOPHAGE) 1000 MG tablet Take 1 tablet (1,000 mg total) by mouth 2 (two) times daily with a meal. 02/26/18  Yes Asa Lente, Tessa N, PA-C  metoprolol tartrate (LOPRESSOR) 50 MG tablet Take 1 tablet (50 mg total) by mouth 2 (two) times daily. 02/26/18  Yes Conte, Tessa N, PA-C  potassium chloride 20 MEQ TBCR Please take one tab twice a day for three days then take one tab daily until we see you in follow-up. 02/26/18  Yes Conte, Tessa N, PA-C  traMADol (ULTRAM) 50 MG tablet Take 1 tablet (50 mg total) by mouth every 6 (six) hours as needed. 03/12/18  Yes Purcell Nails, MD  Vitamin D, Cholecalciferol, 1000 units TABS Take 1 tablet by mouth every morning.    Yes [provider]  oxyCODONE (OXY IR/ROXICODONE) 5 MG immediate release tablet Take 1 tablet (5 mg total) by mouth every 6 (six) hours as needed for severe pain. Patient not taking: Reported on 03/14/2018 02/26/18   Sharlene Dory, PA-C    Family History  Problem Relation Age of Onset  . Lymphoma Mother   . Peripheral vascular disease Father      Social History    Tobacco Use  . Smoking status: Former Smoker    Packs/day: 1.00    Years: 15.00    Pack years: 15.00    Types: Cigarettes    Last attempt to quit: 02/02/2018    Years since quitting: 0.1  . Smokeless tobacco: Never Used  Substance Use Topics  . Alcohol use: No    Frequency: Never  . Drug use: No    Allergies as of 03/13/2018  . (No Known Allergies)    Review of Systems:    All systems reviewed and negative except where noted  in HPI.   Physical Exam:  Vital signs in last 24 hours: Vitals:   03/14/18 0739 03/14/18 0840 03/14/18 0941 03/14/18 1010  BP: (!) 100/49 (!) 102/33 (!) 98/43 (!) 113/43  Pulse: 80 81 86 83  Resp:   20 20  Temp: 98.3 F (36.8 C)  97.8 F (36.6 C) 98.6 F (37 C)  TempSrc: Oral  Oral Oral  SpO2: 100%  100% 100%  Weight:      Height:       Last BM Date: 03/13/18 General:   Pleasant, cooperative in NAD Head:  Normocephalic and atraumatic. Eyes:   No icterus.   Conjunctiva pink. PERRLA. Ears:  Normal auditory acuity. Neck:  Supple; no masses or thyroidomegaly Lungs: Respirations even and unlabored. Lungs clear to auscultation bilaterally.   No wheezes, crackles, or rhonchi.  Abdomen:  Soft, nondistended, nontender. Normal bowel sounds. No appreciable masses or hepatomegaly.  No rebound or guarding.  Neurologic:  Alert and oriented x3;  grossly normal neurologically. Skin:  Intact without significant lesions or rashes. Cervical Nodes:  No significant cervical adenopathy. Psych:  Alert and cooperative. Normal affect.  LAB RESULTS: Recent Labs    03/13/18 2351 03/14/18 0754  WBC 17.3* 13.9*  HGB 4.7* 5.9*  HCT 15.3* 18.0*  PLT 487* 384   BMET Recent Labs    03/11/18 1634 03/13/18 2351 03/14/18 0754  NA 138 134* 137  K 4.6 4.9 5.2*  CL 107 103 106  CO2 21* 13* 23  GLUCOSE 95 267* 164*  BUN 46* 61* 65*  CREATININE 1.08 1.19 1.00  CALCIUM 8.8* 8.8* 8.9   LFT No results for input(s): PROT, ALBUMIN, AST, ALT, ALKPHOS, BILITOT,  BILIDIR, IBILI in the last 72 hours. PT/INR No results for input(s): LABPROT, INR in the last 72 hours.  STUDIES: Dg Chest Port 1 View  Result Date: 03/14/2018 CLINICAL DATA:  72 year old male with shortness of breath. EXAM: PORTABLE CHEST 1 VIEW COMPARISON:  Chest radiograph dated 02/22/2018 FINDINGS: The lungs are clear. There is no pleural effusion or pneumothorax. The cardiac silhouette is within normal limits. Median sternotomy wires, CABG vascular clips, and coronary stent noted. No acute osseous pathology. IMPRESSION: No active disease. Electronically Signed   By: Elgie Collard M.D.   On: 03/14/2018 00:39      Impression / Plan:   Harold Parker is a 72 y.o. y/o male with recent history of CABG on February 19, 2018, admitted with anemia and melena, with elevated troponin to 8  Possible upper GI bleed from peptic ulcer disease versus esophagitis versus AVM versus dual for lesion Troponin is acutely elevated, in this setting endoscopy is very high risk, as the sedation itself can cause a cardiac event during the procedure Given the above, do not recommend proceeding with endoscopy at this time Would recommend Protonix 40 mg IV twice daily Would recommend RBC scan to evaluate source of bleeding, and if positive, next step would be vascular consult for embolization Continue serial CBCs and transfuse PRN Await cardiology evaluation for very elevated troponin  Dr. Tobi Bastos is on over the weekend, and will follow-up with the patient   Thank you for involving me in the care of this patient.      LOS: 0 days   Pasty Spillers, MD  03/14/2018, 1:36 PM

## 2018-03-14 NOTE — Plan of Care (Signed)

## 2018-03-14 NOTE — Care Management (Signed)
Patient was in [facility] visiting had nstemi, required CABG at [facility] and discharged home 3/8 with home health with [facility].  Has home oxygen with [facility].  Patient is currently staying at the home of his friend Jeanella Flattery- [address] [place]. He will eventually move to this area.  Patient presented this admission with weakness and found to be severely anemia with hgb 4.8 due to possible  gi bleed. GI is consulting. [facility] aware of admission

## 2018-03-14 NOTE — H&P (Signed)
[facility] - [place] at [facility]   PATIENT NAME: Harold Parker    MR#:  [id number]  DATE OF BIRTH:  02-May-1946  DATE OF ADMISSION:  03/13/2018  PRIMARY CARE PHYSICIAN: System, Pcp Not In   REQUESTING/REFERRING PHYSICIAN:   CHIEF COMPLAINT:   Chief Complaint  Patient presents with  . Anxiety    HISTORY OF PRESENT ILLNESS: Harold Parker  is a 72 y.o. male with a known history of coronary artery disease, status post CABG, two weeks ago. Patient presented to emergency room for anxiety and shortness of breath.  He has been anxious about switching from oxycodone to tramadol, afraid that he is going to have chest pain again.  He takes Xanax, for his anxiety with minimal results. He denies any chest pain, no fever or chills, no abdominal pain, nausea, vomiting or diarrhea.  He is not aware of any bleeding.  No recent travel or trauma. Blood test done emergency room were notable for low hemoglobin level of 4.7. WBCs elevated at 17.3.  Troponin is 0.42. EKG and chest x-ray, reviewed by myself, are noted without any acute changes. Patient is admitted for further evaluation and treatment.   PAST MEDICAL HISTORY:   Past Medical History:  Diagnosis Date  . Bilateral carotid bruits    a. 01/2018 U/S: < 50% bilat ICA stenosis.  Marland Kitchen CAD (coronary artery disease)    a. 1998 s/p MI and BMS [facility], [place]); b. 1999 redo PCI/rotablator in setting of what sounds like ISR;  c. Multiple stress tests over the years - last ~ 2017, reportedly nl; d. 01/2018 NSTEMI/Cath: LM 78m/d, LAD 50p, 40p/m, D1 60ost, OM1 95, RCA 100ost/p w/ L->R collats, EF 45%; e. s/p 3V CABG 02/19/18 (LIMA-LAD, VG-D1, VG-OM)  . Chronic lower back pain   . COPD (chronic obstructive pulmonary disease) (HCC)   . HTN (hypertension)   . Hypercholesteremia   . Ischemic cardiomyopathy    a. 01/2018 Echo: EF 40-45%, mid-apicalanteroseptal, ant, apical sev HK, mod apicalinferior HK. Gr2 DD. Mod AS, mild MR, mod dil LA,  PASP .  . Moderate aortic stenosis    a. 01/2018 Echo: Mod AS, mean grad (S) , Valve area (VTI) 1.06 cm^2, (Vmax) 1.27 cm^2; b. 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 Myocardial infarction (HCC) ~ 1998/1999  . 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 Tobacco abuse   . Type II diabetes mellitus (HCC)     PAST SURGICAL HISTORY:  Past Surgical History:  Procedure Laterality Date  . AORTIC VALVE REPLACEMENT N/A 02/19/2018   Procedure: AORTIC VALVE REPLACEMENT (AVR);  Surgeon: Purcell Nails, MD;  Location: [facility] OR;  Service: Open Heart Surgery;  Laterality: N/A;  . COLONOSCOPY    . CORONARY ANGIOPLASTY WITH STENT PLACEMENT  ~ 1998/1999  . CORONARY ARTERY BYPASS GRAFT N/A 02/19/2018   Procedure: CORONARY ARTERY BYPASS GRAFTING (CABG) x three , using left internal mammary artery and right leg greater saphenous vein harvested endoscopically;  Surgeon: Purcell Nails, MD;  Location: [facility] OR;  Service: Open Heart Surgery;  Laterality: N/A;  . LEFT HEART CATH AND CORONARY ANGIOGRAPHY N/A 02/06/2018   Procedure: LEFT HEART CATH AND CORONARY ANGIOGRAPHY;  Surgeon: Iran Ouch, MD;  Location: [facility] INVASIVE CV LAB;  Service: Cardiovascular;  Laterality: N/A;  . TEE WITHOUT CARDIOVERSION N/A 02/19/2018   Procedure: TRANSESOPHAGEAL ECHOCARDIOGRAM (TEE);  Surgeon: Purcell Nails, MD;  Location: [facility] OR;  Service: Open Heart Surgery;  Laterality: N/A;  . TONSILLECTOMY      SOCIAL HISTORY:  Social History   Tobacco Use  . Smoking status: Former Smoker    Packs/day: 1.00    Years: 15.00    Pack years: 15.00    Types: Cigarettes    Last attempt to quit: 02/02/2018    Years since quitting: 0.1  . Smokeless tobacco: Never Used  Substance Use Topics  . Alcohol use: No    Frequency: Never    FAMILY HISTORY:   Family History  Problem Relation Age of Onset  . Lymphoma Mother   . Peripheral vascular disease Father     DRUG ALLERGIES: No Known Allergies  REVIEW OF SYSTEMS:   CONSTITUTIONAL: No fever, fatigue or weakness.  EYES: No blurred or double vision.  EARS, NOSE, AND THROAT: No tinnitus or ear pain.  RESPIRATORY: No cough, shortness of breath, wheezing or hemoptysis.  CARDIOVASCULAR: No chest pain, orthopnea, edema.  GASTROINTESTINAL: No nausea, vomiting, diarrhea or abdominal pain.  GENITOURINARY: No dysuria, hematuria.  ENDOCRINE: No polyuria, nocturia,  HEMATOLOGY: No bleeding SKIN: No rash or lesion. MUSCULOSKELETAL: No joint pain or arthritis.   NEUROLOGIC: No tingling, numbness, weakness.  PSYCHIATRY: Positive for anxiety.  MEDICATIONS AT HOME:  Prior to Admission medications   Medication Sig Start Date End Date Taking? Authorizing Provider  ALPRAZolam Prudy Feeler) 0.5 MG tablet Take 0.5 mg by mouth at bedtime as needed for anxiety.   Yes [provider]  Ascorbic Acid (VITAMIN C) 1000 MG tablet Take 1,000 mg by mouth daily.   Yes [provider]  aspirin 81 MG EC tablet Take 1 tablet (81 mg total) by mouth daily. 02/27/18  Yes Asa Lente, Tessa N, PA-C  atorvastatin (LIPITOR) 80 MG tablet Take 1 tablet (80 mg total) by mouth daily at 6 PM. 02/26/18  Yes Asa Lente, Tessa N, PA-C  clopidogrel (PLAVIX) 75 MG tablet Take 1 tablet (75 mg total) by mouth daily. 02/27/18  Yes Conte, Tessa N, PA-C  ezetimibe (ZETIA) 10 MG tablet Take 10 mg by mouth daily.   Yes [provider]  folic acid (FOLVITE) 400 MCG tablet Take 400 mcg by mouth every evening.   Yes [provider]  furosemide (LASIX) 40 MG tablet Please take one 40mg  Tab BID (twice a day) for 3 days then take one 40mg  tab once a day until we see you in follow-up. Patient taking differently: Take 40 mg by mouth daily. Please take one 40mg  Tab BID (twice a day) for 3 days then take one 40mg  tab once a day  until we see you in follow-up. 02/26/18  Yes Asa Lente, Tessa N, PA-C  glipiZIDE (GLUCOTROL) 5 MG tablet Take 0.5 tablets (2.5 mg total) by mouth daily before breakfast. 02/27/18  Yes Conte, Tessa N, PA-C  ipratropium-albuterol (DUONEB) 0.5-2.5 (3) MG/3ML SOLN Take 3 mLs by nebulization every 6 (six) hours as needed. 02/03/18  Yes Emily Filbert, MD  losartan (COZAAR) 100 MG tablet Take 1 tablet (100 mg total) by mouth daily. 02/27/18  Yes Iran Ouch, MD  metFORMIN (GLUCOPHAGE) 1000 MG tablet Take 1 tablet (1,000 mg total) by mouth 2 (two) times daily with a meal. 02/26/18  Yes Asa Lente, Tessa N, PA-C  metoprolol tartrate (LOPRESSOR) 50 MG tablet Take 1 tablet (50 mg total) by mouth 2 (two)  times daily. 02/26/18  Yes Conte, Tessa N, PA-C  potassium chloride 20 MEQ TBCR Please take one tab twice a day for three days then take one tab daily until we see you in follow-up. 02/26/18  Yes Conte, Tessa N, PA-C  traMADol (ULTRAM) 50 MG tablet Take 1 tablet (50 mg total) by mouth every 6 (six) hours as needed. 03/12/18  Yes Purcell Nails, MD  Vitamin D, Cholecalciferol, 1000 units TABS Take 1 tablet by mouth every morning.    Yes [provider]  oxyCODONE (OXY IR/ROXICODONE) 5 MG immediate release tablet Take 1 tablet (5 mg total) by mouth every 6 (six) hours as needed for severe pain. Patient not taking: Reported on 03/14/2018 02/26/18   Sharlene Dory, PA-C      PHYSICAL EXAMINATION:   VITAL SIGNS: Blood pressure (!) 113/52, pulse 88, temperature 97.9 F (36.6 C), resp. rate 17, weight 72.6 kg (160 lb), SpO2 100 %.  GENERAL:  72 y.o.-year-old patient lying in the bed with no acute distress.  He looks anxious. EYES: Pupils equal, round, reactive to light and accommodation. No scleral icterus.  HEENT: Head atraumatic, normocephalic. Oropharynx and nasopharynx clear.  NECK:  Supple, no jugular venous distention. No thyroid enlargement, no tenderness.  LUNGS: Reduced breath sounds bilaterally,  no wheezing. No use of accessory muscles of respiration.  CARDIOVASCULAR: S1, S2 normal. No murmurs, rubs, or gallops.  ABDOMEN: Soft, nontender, nondistended. Bowel sounds present. No organomegaly or mass.  Hematochezia is noted on rectal exam. EXTREMITIES: No pedal edema, cyanosis, or clubbing.  NEUROLOGIC: No focal weakness. PSYCHIATRIC: The patient is alert and oriented x 3.  SKIN: Patient is pale.  No obvious rash, lesion, or ulcer.   LABORATORY PANEL:   CBC Recent Labs  Lab 03/13/18 2351  WBC 17.3*  HGB 4.7*  HCT 15.3*  PLT 487*  MCV 99.4  MCH 30.5  MCHC 30.7*  RDW 18.6*  LYMPHSABS 2.8  MONOABS 1.2*  EOSABS 0.3  BASOSABS 0.2*   ------------------------------------------------------------------------------------------------------------------  Chemistries  Recent Labs  Lab 03/11/18 1634 03/13/18 2351  NA 138 134*  K 4.6 4.9  CL 107 103  CO2 21* 13*  GLUCOSE 95 267*  BUN 46* 61*  CREATININE 1.08 1.19  CALCIUM 8.8* 8.8*   ------------------------------------------------------------------------------------------------------------------ estimated creatinine clearance is 49.9 mL/min (by C-G formula based on SCr of 1.19 mg/dL). ------------------------------------------------------------------------------------------------------------------ No results for input(s): TSH, T4TOTAL, T3FREE, THYROIDAB in the last 72 hours.  Invalid input(s): FREET3   Coagulation profile No results for input(s): INR, PROTIME in the last 168 hours. ------------------------------------------------------------------------------------------------------------------- No results for input(s): DDIMER in the last 72 hours. -------------------------------------------------------------------------------------------------------------------  Cardiac Enzymes Recent Labs  Lab 03/13/18 2351  TROPONINI 0.42*    ------------------------------------------------------------------------------------------------------------------ Invalid input(s): POCBNP  ---------------------------------------------------------------------------------------------------------------  Urinalysis No results found for: COLORURINE, APPEARANCEUR, LABSPEC, PHURINE, GLUCOSEU, HGBUR, BILIRUBINUR, KETONESUR, PROTEINUR, UROBILINOGEN, NITRITE, LEUKOCYTESUR   RADIOLOGY: Dg Chest Port 1 View  Result Date: 03/14/2018 CLINICAL DATA:  72 year old male with shortness of breath. EXAM: PORTABLE CHEST 1 VIEW COMPARISON:  Chest radiograph dated 02/22/2018 FINDINGS: The lungs are clear. There is no pleural effusion or pneumothorax. The cardiac silhouette is within normal limits. Median sternotomy wires, CABG vascular clips, and coronary stent noted. No acute osseous pathology. IMPRESSION: No active disease. Electronically Signed   By: Elgie Collard M.D.   On: 03/14/2018 00:39    EKG: Orders placed or performed during the hospital encounter of 03/13/18  . ED EKG  . ED EKG    IMPRESSION AND  PLAN:  1.  Severe anemia, likely secondary to GI bleed and also, secondary to acute blood loss, status post recent CABG.  Hemoglobin level is 4.7.  We will start Protonix IV.  We will transfuse 2 units packed RBC .  Continue to monitor CBC closely.  Will hold aspirin and Plavix for now.  Cardiology and gastroenterology are consulted for further evaluation and treatment. 2.  Non-ST elevation MI.  Troponin level is slightly elevated at 0.42, likely from demand ischemia secondary to severe anemia.  We will continue to monitor patient on telemetry and follow troponin levels.  Cardiology is consulted for further evaluation and treatment. 3.  Hematochezia.  We will avoid blood thinners for now.  Gastroenterology is consulted for further evaluation and treatment. 4.  Diabetes type 2.  Will monitor blood sugars before meals and at bedtime and use insulin  treatment during the hospital stay.  We will hold DM2 p.o. meds for now. 5.  CAD, status post CABG, 2 weeks ago.  Stable, continue medical treatment.  Will hold anticoagulation due to GI bleed at this time. 6.  COPD stable, continue home maintenance medications.  All the records are reviewed and case discussed with ED provider. Management plans discussed with the patient, family and they are in agreement.  CODE STATUS:    Code Status Orders  (From admission, onward)        Start     Ordered   03/14/18 0136  Full code  Continuous     03/14/18 0135    Code Status History    Date Active Date Inactive Code Status Order ID Comments User Context   02/07/2018 1636 02/26/2018 2129 Full Code [id number]  Creig Hines, NP Inpatient   02/05/2018 2047 02/07/2018 1608 Full Code [id number]  Shaune Pollack, MD Inpatient    Advance Directive Documentation     Most Recent Value  Type of Advance Directive  Healthcare Power of Attorney, Living will  Pre-existing out of facility DNR order (yellow form or pink MOST form)  -  "MOST" Form in Place?  -       TOTAL TIME TAKING CARE OF THIS PATIENT: 40 minutes.    Cammy Copa M.D on 03/14/2018 at 4:40 AM  Between 7am to 6pm - Pager - [phone]  After 6pm go to [email or web address] - password [email or web address]  [facility]  Office  [phone]  CC: Primary care physician; System, Pcp Not In

## 2018-03-14 NOTE — ED Provider Notes (Signed)
[facility] Emergency Department Provider Note   ____________________________________________   First MD Initiated Contact with Patient 03/13/18 2351     (approximate)  I have reviewed the triage vital signs and the nursing notes.   HISTORY  Chief Complaint Anxiety    HPI Harold Parker is a 72 y.o. male brought to the ED via EMS with a chief complaint of anxiety.  Patient had CABG at [facility] 2 weeks ago.  Has been weaning himself off oxycodone and took his last one yesterday.  Began to take tramadol today.  States "I am scared that the pain is going to start".  Denies active chest pain or postoperative pain but is anxious despite taking Xanax.  EMS reports patient was hyperventilating on their arrival.  Denies recent fever, chills, chest pain, shortness of breath, abdominal pain, nausea, vomiting, diarrhea.  Denies recent travel or trauma.   Past Medical History:  Diagnosis Date  . Bilateral carotid bruits    a. 01/2018 U/S: < 50% bilat ICA stenosis.  Marland Kitchen CAD (coronary artery disease)    a. 1998 s/p MI and BMS [facility], [place]); b. 1999 redo PCI/rotablator in setting of what sounds like ISR;  c. Multiple stress tests over the years - last ~ 2017, reportedly nl; d. 01/2018 NSTEMI/Cath: LM 66m/d, LAD 50p, 40p/m, D1 60ost, OM1 95, RCA 100ost/p w/ L->R collats, EF 45%; e. s/p 3V CABG 02/19/18 (LIMA-LAD, VG-D1, VG-OM)  . Chronic lower back pain   . COPD (chronic obstructive pulmonary disease) (HCC)   . HTN (hypertension)   . Hypercholesteremia   . Ischemic cardiomyopathy    a. 01/2018 Echo: EF 40-45%, mid-apicalanteroseptal, ant, apical sev HK, mod apicalinferior HK. Gr2 DD. Mod AS, mild MR, mod dil LA, PASP .  . Moderate aortic stenosis    a. 01/2018 Echo: Mod AS, mean grad (S) , Valve area (VTI) 1.06 cm^2, (Vmax) 1.27 cm^2; b. 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 Myocardial infarction (HCC) ~ 1998/1999  . 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 Tobacco abuse   . Type II diabetes mellitus [facility])     Patient Active Problem List   Diagnosis Date Noted  . Severe anemia 03/14/2018  . 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 Preoperative respiratory examination 02/13/2018  . Acute respiratory failure with hypoxia (HCC) 02/11/2018  . Influenza with respiratory manifestation 02/11/2018  . Stage 3 severe COPD by GOLD classification (HCC)   . Aortic stenosis   . Bronchitis, chronic obstructive, with exacerbation (HCC)   . Tobacco abuse   . CAD (coronary artery disease) 02/07/2018  . Acute systolic CHF (congestive heart failure) (HCC) 02/07/2018  . Ischemic cardiomyopathy 02/07/2018  . Type II diabetes mellitus (HCC) 02/07/2018  . Hyperlipidemia LDL goal <70 02/07/2018  . COPD exacerbation (HCC) 02/05/2018  . NSTEMI (non-ST elevated myocardial infarction) (HCC) 02/05/2018    Past Surgical History:  Procedure Laterality Date  . AORTIC VALVE REPLACEMENT N/A 02/19/2018   Procedure: AORTIC VALVE REPLACEMENT (AVR);  Surgeon: Purcell Nails, MD;  Location[facility] OR;  Service: Open Heart Surgery;  Laterality: N/A;  . COLONOSCOPY    . CORONARY ANGIOPLASTY WITH STENT PLACEMENT  ~ 1998/1999  .  CORONARY ARTERY BYPASS GRAFT N/A 02/19/2018   Procedure: CORONARY ARTERY BYPASS GRAFTING (CABG) x three , using left internal mammary artery and right leg greater saphenous vein harvested endoscopically;  Surgeon: Purcell Nails, MD;  Location: [facility] OR;  Service: Open Heart Surgery;  Laterality: N/A;  . LEFT HEART CATH AND CORONARY ANGIOGRAPHY N/A 02/06/2018   Procedure: LEFT HEART CATH AND CORONARY ANGIOGRAPHY;  Surgeon: Iran Ouch, MD;  Location: [facility] INVASIVE CV LAB;  Service: Cardiovascular;  Laterality:  N/A;  . TEE WITHOUT CARDIOVERSION N/A 02/19/2018   Procedure: TRANSESOPHAGEAL ECHOCARDIOGRAM (TEE);  Surgeon: Purcell Nails, MD;  Location: [facility] OR;  Service: Open Heart Surgery;  Laterality: N/A;  . TONSILLECTOMY      Prior to Admission medications   Medication Sig Start Date End Date Taking? Authorizing Provider  ALPRAZolam Prudy Feeler) 0.5 MG tablet Take 0.5 mg by mouth at bedtime as needed for anxiety.   Yes [provider]  Ascorbic Acid (VITAMIN C) 1000 MG tablet Take 1,000 mg by mouth daily.   Yes [provider]  aspirin 81 MG EC tablet Take 1 tablet (81 mg total) by mouth daily. 02/27/18  Yes Asa Lente, Tessa N, PA-C  atorvastatin (LIPITOR) 80 MG tablet Take 1 tablet (80 mg total) by mouth daily at 6 PM. 02/26/18  Yes Asa Lente, Tessa N, PA-C  clopidogrel (PLAVIX) 75 MG tablet Take 1 tablet (75 mg total) by mouth daily. 02/27/18  Yes Conte, Tessa N, PA-C  ezetimibe (ZETIA) 10 MG tablet Take 10 mg by mouth daily.   Yes [provider]  folic acid (FOLVITE) 400 MCG tablet Take 400 mcg by mouth every evening.   Yes [provider]  furosemide (LASIX) 40 MG tablet Please take one 40mg  Tab BID (twice a day) for 3 days then take one 40mg  tab once a day until we see you in follow-up. Patient taking differently: Take 40 mg by mouth daily. Please take one 40mg  Tab BID (twice a day) for 3 days then take one 40mg  tab once a day until we see you in follow-up. 02/26/18  Yes Asa Lente, Tessa N, PA-C  glipiZIDE (GLUCOTROL) 5 MG tablet Take 0.5 tablets (2.5 mg total) by mouth daily before breakfast. 02/27/18  Yes Conte, Tessa N, PA-C  ipratropium-albuterol (DUONEB) 0.5-2.5 (3) MG/3ML SOLN Take 3 mLs by nebulization every 6 (six) hours as needed. 02/03/18  Yes Emily Filbert, MD  losartan (COZAAR) 100 MG tablet Take 1 tablet (100 mg total) by mouth daily. 02/27/18  Yes Iran Ouch, MD  metFORMIN (GLUCOPHAGE) 1000 MG tablet Take 1 tablet (1,000 mg total) by mouth 2 (two) times daily  with a meal. 02/26/18  Yes Asa Lente, Tessa N, PA-C  metoprolol tartrate (LOPRESSOR) 50 MG tablet Take 1 tablet (50 mg total) by mouth 2 (two) times daily. 02/26/18  Yes Conte, Tessa N, PA-C  potassium chloride 20 MEQ TBCR Please take one tab twice a day for three days then take one tab daily until we see you in follow-up. 02/26/18  Yes Conte, Tessa N, PA-C  traMADol (ULTRAM) 50 MG tablet Take 1 tablet (50 mg total) by mouth every 6 (six) hours as needed. 03/12/18  Yes Purcell Nails, MD  Vitamin D, Cholecalciferol, 1000 units TABS Take 1 tablet by mouth every morning.    Yes [provider]  oxyCODONE (OXY IR/ROXICODONE) 5 MG immediate release tablet Take 1 tablet (5 mg total) by mouth every 6 (six) hours as needed for severe pain. Patient not  taking: Reported on 03/14/2018 02/26/18   Sharlene Dory, PA-C    Allergies Patient has no known allergies.  Family History  Problem Relation Age of Onset  . Lymphoma Mother   . Peripheral vascular disease Father     Social History Social History   Tobacco Use  . Smoking status: Former Smoker    Packs/day: 1.00    Years: 15.00    Pack years: 15.00    Types: Cigarettes    Last attempt to quit: 02/02/2018    Years since quitting: 0.1  . Smokeless tobacco: Never Used  Substance Use Topics  . Alcohol use: No    Frequency: Never  . Drug use: No    Review of Systems  Constitutional: No fever/chills. Eyes: No visual changes. ENT: No sore throat. Cardiovascular: Denies chest pain. Respiratory: Denies shortness of breath. Gastrointestinal: No abdominal pain.  No nausea, no vomiting.  No diarrhea.  No constipation. Genitourinary: Negative for dysuria. Musculoskeletal: Negative for back pain. Skin: Negative for rash. Neurological: Negative for headaches, focal weakness or numbness. Psychiatric:Positive for anxiety.   ____________________________________________   PHYSICAL EXAM:  VITAL SIGNS: ED Triage Vitals [03/13/18 2344]  Enc  Vitals Group     BP (!) 134/46     Pulse Rate 88     Resp 20     Temp (!) 97.3 F (36.3 C)     Temp Source Oral     SpO2 100 %     Weight 160 lb (72.6 kg)     Height      Head Circumference      Peak Flow      Pain Score 0     Pain Loc      Pain Edu?      Excl. in GC?     Constitutional: Alert and oriented. Well appearing and in no acute distress. Eyes: Conjunctivae are normal. PERRL. EOMI. Head: Atraumatic. Nose: No congestion/rhinnorhea. Mouth/Throat: Mucous membranes are moist.  Oropharynx non-erythematous. Neck: No stridor.   Cardiovascular: Normal rate, regular rhythm. I/VI SEM.  Good peripheral circulation.  Postoperative site clean/dry/intact. Respiratory: Normal respiratory effort.  No retractions. Lungs CTAB. Gastrointestinal: Soft and nontender. No distention. No abdominal bruits. No CVA tenderness. Musculoskeletal: No lower extremity tenderness. 1+ BLE nonpitting edema.  No joint effusions. Neurologic:  Normal speech and language. No gross focal neurologic deficits are appreciated.  Skin:  Skin is pale, warm, dry and intact. No rash noted. Psychiatric: Mood and affect are normal. Speech and behavior are normal.  ____________________________________________   LABS (all labs ordered are listed, but only abnormal results are displayed)  Labs Reviewed  CBC WITH DIFFERENTIAL/PLATELET - Abnormal; Notable for the following components:      Result Value   WBC 17.3 (*)    RBC 1.54 (*)    Hemoglobin 4.7 (*)    HCT 15.3 (*)    MCHC 30.7 (*)    RDW 18.6 (*)    Platelets 487 (*)    Neutro Abs 12.8 (*)    Monocytes Absolute 1.2 (*)    Basophils Absolute 0.2 (*)    All other components within normal limits  BASIC METABOLIC PANEL - Abnormal; Notable for the following components:   Sodium 134 (*)    CO2 13 (*)    Glucose, Bld 267 (*)    BUN 61 (*)    Calcium 8.8 (*)    GFR calc non Af Amer 59 (*)    Anion gap 18 (*)  All other components within normal limits    TROPONIN I - Abnormal; Notable for the following components:   Troponin I 0.42 (*)    All other components within normal limits  BASIC METABOLIC PANEL  CBC  TYPE AND SCREEN  PREPARE RBC (CROSSMATCH)  ABO/RH   ____________________________________________  EKG  ED ECG REPORT I, Mekhi Lascola J, the attending physician, personally viewed and interpreted this ECG.   Date: 03/14/2018  EKG Time: 0058  Rate: 86  Rhythm: normal EKG, normal sinus rhythm  Axis: Normal  Intervals:nonspecific intraventricular conduction delay  ST&T Change: ST depression inferior laterally  ____________________________________________  RADIOLOGY  ED MD interpretation: No acute cardiopulmonary process  Official radiology report(s): Dg Chest Port 1 View  Result Date: 03/14/2018 CLINICAL DATA:  72 year old male with shortness of breath. EXAM: PORTABLE CHEST 1 VIEW COMPARISON:  Chest radiograph dated 02/22/2018 FINDINGS: The lungs are clear. There is no pleural effusion or pneumothorax. The cardiac silhouette is within normal limits. Median sternotomy wires, CABG vascular clips, and coronary stent noted. No acute osseous pathology. IMPRESSION: No active disease. Electronically Signed   By: Elgie Collard M.D.   On: 03/14/2018 00:39    ____________________________________________   PROCEDURES  Procedure(s) performed:   Rectal exam: Hematochezia noted on gloved finger which is immediately heme +.  Procedures  Critical Care performed: Yes, see critical care note(s)   CRITICAL CARE Performed by: Irean Hong   Total critical care time: 30 minutes  Critical care time was exclusive of separately billable procedures and treating other patients.  Critical care was necessary to treat or prevent imminent or life-threatening deterioration.  Critical care was time spent personally by me on the following activities: development of treatment plan with patient and/or surrogate as well as nursing,  discussions with consultants, evaluation of patient's response to treatment, examination of patient, obtaining history from patient or surrogate, ordering and performing treatments and interventions, ordering and review of laboratory studies, ordering and review of radiographic studies, pulse oximetry and re-evaluation of patient's condition.  ____________________________________________   INITIAL IMPRESSION / ASSESSMENT AND PLAN / ED COURSE  As part of my medical decision making, I reviewed the following data within the electronic MEDICAL RECORD NUMBER History obtained from family, Nursing notes reviewed and incorporated, Labs reviewed, EKG interpreted, Old chart reviewed, Discussed with admitting physician and Notes from prior ED visits   72 year old male who presents with anxiety status post CABG 2 weeks ago.  He is weaning himself off oxycodone and transitioning to tramadol and is concerned that his postoperative chest pain will return.  Appears pale.  Differential diagnosis includes but is not limited to anxiety, ACS, CHF, infection, symptomatic anemia, etc.  Will obtain screening lab work and chest x-ray; low-dose Ativan administered for anxiety.  Clinical Course as of Mar 15 143  Fri Mar 14, 2018  0035 Noted low hemoglobin hematocrit.  Hematochezia with guaiac positive stool noted on rectal exam.  Discussed with patient and his fiance who consent to blood transfusion.  Will order 2 units PRBC with a dose of Lasix in between.  Discussed with hospitalist Dr. Caryn Bee who will evaluate patient in the emergency department for admission.   [JS]  0142 Elevated troponin has decreased from prior.  Troponin I(!!) [JS]  [id number] Elevated BUN has increased from prior.  BUN(!): 61 [JS]  0143 Remains hemodynamically stable; going to inpatient room.   [JS]    Clinical Course User Index [JS] Irean Hong, MD     ____________________________________________  FINAL CLINICAL IMPRESSION(S) / ED  DIAGNOSES  Final diagnoses:  Symptomatic anemia  Gastrointestinal hemorrhage, unspecified gastrointestinal hemorrhage type  Elevated troponin     ED Discharge Orders    None       Note:  This document was prepared using Dragon voice recognition software and may include unintentional dictation errors.    Irean Hong, MD 03/14/18 [phone]

## 2018-03-14 NOTE — Consult Note (Signed)
Cardiology Consultation:   Patient ID: Harold Parker; [id number]; 03/26/1946   Admit date: 03/13/2018 Date of Consult: 03/14/2018  Primary Care Provider: System, Pcp Not In Primary Cardiologist: Arida   Patient Profile:   Harold Parker is a 72 y.o. 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 Caryn Bee.  History of Present Illness:   Mr. [name] has known CAD s/p MI in 1998 with BMS followed by repeat LHC several month later for recurrent chest pain with repeat intervention and possible rotablator per the patient. He has had regular follow up in [place] with mutliple stress tests over the years. He was in [place] visiting his girlfriend in 01/2018 when he noting fever, fatigue, dyspnea, cough and wheezing. He was initially seen at an outside urgent care and given inhalers/prednisone. His symptoms did not improve, prompting him to go to the ED where he tested positive for influenza A. He was prescribed Tamiflu, Tussionex, and Duoneb and discharged home. He never filled the Tamiflu. On 02/05/18 he developed sudden onset of dyspnea. BP was noted to be elevated at > 200 mmHg by EMS. Upon his arrival to the ED EKG showed sinus tachycardia, subtle ST depression in V5/V6. CXR showed pulmonary edema. Initial troponin 0.36 that peaked at 2.65. LHC revealed severe distal left main stenosis along with significant OM2 disease, CTO of the RCA with left-to-right collaterals. Echo showed mild LV dysfunction with an EF of 40-45% and moderate aortic stenosis. He was transferred to [facility] for evaluation by CT surgery. He was medically stablized at [facility] and underwent successful bioprosthetic AVR with an Edwards Inspiris Resilia stented bovine  pericardial tissue valve (25 mm) and 3-vessel CABG with LIMA to distal LAD, SVG to D1, SVG to OM on 02/19/2018. Post-operatively, he developed Afib which was treated with IV amiodarone with conversion to sinus rhythm. Otherwise, his post-operative course was uncomplicated. He was discharged on ASA 81 mg, Plavix 75 mg, Zetia 10 mg, folic acid 400 mcg, Lasix 40 mg bid x 3 days followed by 40 mg daily thereafter, glipizide 5 mg, losartan 100 mg, metformin 1000 mg bid, Lopressor 50 mg bid, oxycodone 5 mg, KCl 20 mEq bid x 3 days then daily thereafter, vitamin C 1000 mg, vitamin D, along with his inhaler. Discharge labs showed a BUN/SCr 20/0.9, K+ 3.9, glucose 112, magnesium 2.1, WBC 10.6, HGB 10, PLT 283.   He was seen in the office on 3/26 and was doing well from a cardiac perspective. He denied any symptoms concerning for GI bleed. Over the past 2 days he has noted increased SOB with fatigue. He has also noted melena. No chest pain. Because of the fatigue and SOB he presented to [facility].   Upon the patient's arrival to [facility] they were found to have stable vitals, oxygen saturation 100% on room air, weight 160 pounds. EKG as below, CXR showed no acute disease. Labs showed WBC 17.3, HGB 4.7, PLT 487, Na 134, K+ 4.9, glucose 267, BUN 61, SCr 1.19, troponin 0.42. He was started on IV Protonix and transfused 2 units of pRBC. His DAPT was held.   Past Medical History:  Diagnosis Date  . Bilateral carotid bruits  a. 01/2018 U/S: < 50% bilat ICA stenosis.  Marland Kitchen CAD (coronary artery disease)    a. 1998 s/p MI and BMS [facility], [place]); b. 1999 redo PCI/rotablator in setting of what sounds like ISR;  c. Multiple stress tests over the years - last ~ 2017, reportedly nl; d. 01/2018 NSTEMI/Cath: LM 70m/d, LAD 50p, 40p/m, D1 60ost, OM1 95, RCA 100ost/p w/ L->R collats, EF 45%; e. s/p 3V CABG 02/19/18 (LIMA-LAD, VG-D1, VG-OM)  . Chronic lower back pain   . COPD (chronic obstructive pulmonary disease) (HCC)   . HTN (hypertension)     . Hypercholesteremia   . Ischemic cardiomyopathy    a. 01/2018 Echo: EF 40-45%, mid-apicalanteroseptal, ant, apical sev HK, mod apicalinferior HK. Gr2 DD. Mod AS, mild MR, mod dil LA, PASP .  . Moderate aortic stenosis    a. 01/2018 Echo: Mod AS, mean grad (S) , Valve area (VTI) 1.06 cm^2, (Vmax) 1.27 cm^2; b. 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 Myocardial infarction (HCC) ~ 1998/1999  . 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 Tobacco abuse   . Type II diabetes mellitus (HCC)     Past Surgical History:  Procedure Laterality Date  . AORTIC VALVE REPLACEMENT N/A 02/19/2018   Procedure: AORTIC VALVE REPLACEMENT (AVR);  Surgeon: Purcell Nails, MD;  Location: [facility] OR;  Service: Open Heart Surgery;  Laterality: N/A;  . COLONOSCOPY    . CORONARY ANGIOPLASTY WITH STENT PLACEMENT  ~ 1998/1999  . CORONARY ARTERY BYPASS GRAFT N/A 02/19/2018   Procedure: CORONARY ARTERY BYPASS GRAFTING (CABG) x three , using left internal mammary artery and right leg greater saphenous vein harvested endoscopically;  Surgeon: Purcell Nails, MD;  Location: [facility] OR;  Service: Open Heart Surgery;  Laterality: N/A;  . LEFT HEART CATH AND CORONARY ANGIOGRAPHY N/A 02/06/2018   Procedure: LEFT HEART CATH AND CORONARY ANGIOGRAPHY;  Surgeon: Iran Ouch, MD;  Location: [facility] INVASIVE CV LAB;  Service: Cardiovascular;  Laterality: N/A;  . TEE WITHOUT CARDIOVERSION N/A 02/19/2018   Procedure: TRANSESOPHAGEAL ECHOCARDIOGRAM (TEE);  Surgeon: Purcell Nails, MD;  Location: [facility] OR;  Service: Open Heart Surgery;  Laterality: N/A;  . TONSILLECTOMY       Home Meds: Prior to Admission medications   Medication Sig Start Date End Date Taking? Authorizing Provider  ALPRAZolam Prudy Feeler) 0.5 MG tablet Take 0.5 mg by mouth at bedtime as needed  for anxiety.   Yes [provider]  Ascorbic Acid (VITAMIN C) 1000 MG tablet Take 1,000 mg by mouth daily.   Yes [provider]  aspirin 81 MG EC tablet Take 1 tablet (81 mg total) by mouth daily. 02/27/18  Yes Asa Lente, Tessa N, PA-C  atorvastatin (LIPITOR) 80 MG tablet Take 1 tablet (80 mg total) by mouth daily at 6 PM. 02/26/18  Yes Asa Lente, Tessa N, PA-C  clopidogrel (PLAVIX) 75 MG tablet Take 1 tablet (75 mg total) by mouth daily. 02/27/18  Yes Conte, Tessa N, PA-C  ezetimibe (ZETIA) 10 MG tablet Take 10 mg by mouth daily.   Yes [provider]  folic acid (FOLVITE) 400 MCG tablet Take 400 mcg by mouth every evening.   Yes [provider]  furosemide (LASIX) 40 MG tablet Please take one 40mg  Tab BID (twice a day) for 3  days then take one 40mg  tab once a day until we see you in follow-up. Patient taking differently: Take 40 mg by mouth daily. Please take one 40mg  Tab BID (twice a day) for 3 days then take one 40mg  tab once a day until we see you in follow-up. 02/26/18  Yes Asa Lente, Tessa N, PA-C  glipiZIDE (GLUCOTROL) 5 MG tablet Take 0.5 tablets (2.5 mg total) by mouth daily before breakfast. 02/27/18  Yes Conte, Tessa N, PA-C  ipratropium-albuterol (DUONEB) 0.5-2.5 (3) MG/3ML SOLN Take 3 mLs by nebulization every 6 (six) hours as needed. 02/03/18  Yes Emily Filbert, MD  losartan (COZAAR) 100 MG tablet Take 1 tablet (100 mg total) by mouth daily. 02/27/18  Yes Iran Ouch, MD  metFORMIN (GLUCOPHAGE) 1000 MG tablet Take 1 tablet (1,000 mg total) by mouth 2 (two) times daily with a meal. 02/26/18  Yes Asa Lente, Tessa N, PA-C  metoprolol tartrate (LOPRESSOR) 50 MG tablet Take 1 tablet (50 mg total) by mouth 2 (two) times daily. 02/26/18  Yes Conte, Tessa N, PA-C  potassium chloride 20 MEQ TBCR Please take one tab twice a day for three days then take one tab daily until we see you in follow-up. 02/26/18  Yes Conte, Tessa N, PA-C  traMADol (ULTRAM) 50 MG tablet Take 1  tablet (50 mg total) by mouth every 6 (six) hours as needed. 03/12/18  Yes Purcell Nails, MD  Vitamin D, Cholecalciferol, 1000 units TABS Take 1 tablet by mouth every morning.    Yes [provider]  oxyCODONE (OXY IR/ROXICODONE) 5 MG immediate release tablet Take 1 tablet (5 mg total) by mouth every 6 (six) hours as needed for severe pain. Patient not taking: Reported on 03/14/2018 02/26/18   Sharlene Dory, PA-C    Inpatient Medications: Scheduled Meds: . atorvastatin  80 mg Oral q1800  . cholecalciferol  1,000 Units Oral BH-q7a  . docusate sodium  100 mg Oral BID  . ezetimibe  10 mg Oral Daily  . folic acid  500 mcg Oral QPM  . furosemide  40 mg Oral Daily  . insulin aspart  0-5 Units Subcutaneous QHS  . insulin aspart  0-9 Units Subcutaneous TID WC  . losartan  100 mg Oral Daily  . metoprolol tartrate  50 mg Oral BID  . pantoprazole (PROTONIX) IV  40 mg Intravenous Q12H  . vitamin C  1,000 mg Oral Daily   Continuous Infusions: . sodium chloride Stopped (03/14/18 0621)   PRN Meds: acetaminophen **OR** acetaminophen, ALPRAZolam, bisacodyl, HYDROcodone-acetaminophen, ipratropium-albuterol, ondansetron **OR** ondansetron (ZOFRAN) IV, traMADol, traZODone  Allergies:  No Known Allergies  Social History:   Social History   Socioeconomic History  . Marital status: Widowed    Spouse name: Not on file  . Number of children: Not on file  . Years of education: Not on file  . Highest education level: Not on file  Occupational History  . Not on file  Social Needs  . Financial resource strain: Not on file  . Food insecurity:    Worry: Not on file    Inability: Not on file  . Transportation needs:    Medical: Not on file    Non-medical: Not on file  Tobacco Use  . Smoking status: Former Smoker    Packs/day: 1.00    Years: 15.00    Pack years: 15.00    Types: Cigarettes    Last attempt to quit: 02/02/2018    Years since quitting: 0.1  . Smokeless  tobacco: Never Used   Substance and Sexual Activity  . Alcohol use: No    Frequency: Never  . Drug use: No  . Sexual activity: Yes  Lifestyle  . Physical activity:    Days per week: Not on file    Minutes per session: Not on file  . Stress: Not on file  Relationships  . Social connections:    Talks on phone: Not on file    Gets together: Not on file    Attends religious service: Not on file    Active member of club or organization: Not on file    Attends meetings of clubs or organizations: Not on file    Relationship status: Not on file  . Intimate partner violence:    Fear of current or ex partner: Not on file    Emotionally abused: Not on file    Physically abused: Not on file    Forced sexual activity: Not on file  Other Topics Concern  . Not on file  Social History Narrative   Lives in [place]) by himself.  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  Fairly active but doesn't routinely exercise.     Family History:   Family History  Problem Relation Age of Onset  . Lymphoma Mother   . Peripheral vascular disease Father     ROS:  Review of Systems  Constitutional: Positive for malaise/fatigue. Negative for chills, diaphoresis, fever and weight loss.  HENT: Negative for congestion.   Eyes: Negative for discharge and redness.  Respiratory: Positive for shortness of breath. Negative for cough, hemoptysis, sputum production and wheezing.   Cardiovascular: Negative for chest pain, palpitations, orthopnea, claudication, leg swelling and PND.  Gastrointestinal: Positive for diarrhea and melena. Negative for abdominal pain, blood in stool, constipation, heartburn, nausea and vomiting.  Genitourinary: Negative for hematuria.  Musculoskeletal: Negative for falls and myalgias.  Skin: Negative for rash.  Neurological: Positive for weakness. Negative for dizziness, tingling, tremors, sensory change, speech change, focal weakness and loss of consciousness.   Endo/Heme/Allergies: Does not bruise/bleed easily.  Psychiatric/Behavioral: Negative for substance abuse. The patient is not nervous/anxious.   All other systems reviewed and are negative.     Physical Exam/Data:   Vitals:   03/14/18 0200 03/14/18 0233 03/14/18 0414 03/14/18 0615  BP: (!) 121/49 (!) 108/54 (!) 113/52 (!) 110/50  Pulse: 85 87 88 86  Resp: 18 15 17    Temp: 98.3 F (36.8 C) 98.2 F (36.8 C) 97.9 F (36.6 C) 97.9 F (36.6 C)  TempSrc: Oral Oral  Oral  SpO2: 100% 100% 100% 100%  Weight: 158 lb 3.2 oz (71.8 kg)     Height: 5\' 5"  (1.651 m)       Intake/Output Summary (Last 24 hours) at 03/14/2018 0709 Last data filed at 03/14/2018 0215 Gross per 24 hour  Intake 400 ml  Output -  Net 400 ml   Filed Weights   03/13/18 2344 03/14/18 0200  Weight: 160 lb (72.6 kg) 158 lb 3.2 oz (71.8 kg)   Body mass index is 26.33 kg/m.   Physical Exam: General: Well developed, well nourished, in no acute distress. Head: Normocephalic, atraumatic, sclera non-icteric, no xanthomas, nares without discharge.  Neck: Negative for carotid bruits. JVD not elevated. Lungs: Clear bilaterally to auscultation without wheezes, rales, or rhonchi. Breathing is unlabored. Heart: RRR with S1 S2. I/VI systolic murmurs RUSB, no rubs, or gallops appreciated. Abdomen: Soft, non-tender, non-distended with normoactive bowel sounds. No hepatomegaly.  No rebound/guarding. No obvious abdominal masses. Msk:  Strength and tone appear normal for age. Extremities: No clubbing or cyanosis. No edema. Distal pedal pulses are 2+ and equal bilaterally. Neuro: Alert and oriented X 3. No facial asymmetry. No focal deficit. Moves all extremities spontaneously. Psych:  Responds to questions appropriately with a normal affect.   EKG:  The EKG was personally reviewed and demonstrates: NSR, 86 bpm, nonspecific IVCD, inferior Q waves, nonspecific inferolateral st/t changes Telemetry:  Telemetry was personally reviewed  and demonstrates: NSR, PVCs  Weights: Filed Weights   03/13/18 2344 03/14/18 0200  Weight: 160 lb (72.6 kg) 158 lb 3.2 oz (71.8 kg)    Relevant CV Studies: LHC 02/06/2018: Conclusion     There is mild to moderate left ventricular systolic dysfunction.  LV end diastolic pressure is severely elevated.  Ost RCA to Prox RCA lesion is 100% stenosed.  Mid LM to Dist LM lesion is 85% stenosed.  Ost 1st Mrg lesion is 95% stenosed.  Prox LAD to Mid LAD lesion is 40% stenosed.  Prox LAD lesion is 50% stenosed.  Ost 1st Diag to 1st Diag lesion is 60% stenosed.   1.  Severe left main and three-vessel coronary artery disease with chronically occluded right coronary artery with left-to-right collaterals.  The coronary arteries are overall heavily calcified and diffusely diseased. 2.  Mildly reduced LV systolic function with an EF of 40%.  Echocardiogram showed wall motion abnormalities in the anterior as well as inferior walls.  3.  Severely elevated filling pressures in the setting of severely elevated blood pressure. 4.  Moderate aortic stenosis with a peak to peak gradient of 20 mmHg.  Recommendations: Transfer to [facility] for CABG and aortic valve replacement.  The patient will need to be tuned up given flu.  He also appears to be volume overloaded and blood pressure is uncontrolled.  I added losartan and will diuresis with furosemide. Heparin drip to be resumed in 8 hours.     Laboratory Data:  Chemistry Recent Labs  Lab 03/11/18 1634 03/13/18 2351  NA 138 134*  K 4.6 4.9  CL 107 103  CO2 21* 13*  GLUCOSE 95 267*  BUN 46* 61*  CREATININE 1.08 1.19  CALCIUM 8.8* 8.8*  GFRNONAA >60 59*  GFRAA >60 >60  ANIONGAP 10 18*    No results for input(s): PROT, ALBUMIN, AST, ALT, ALKPHOS, BILITOT in the last 168 hours. Hematology Recent Labs  Lab 03/13/18 2351  WBC 17.3*  RBC 1.54*  HGB 4.7*  HCT 15.3*  MCV 99.4  MCH 30.5  MCHC 30.7*  RDW 18.6*  PLT 487*   Cardiac  Enzymes Recent Labs  Lab 03/13/18 2351  TROPONINI 0.42*   No results for input(s): TROPIPOC in the last 168 hours.  BNPNo results for input(s): BNP, PROBNP in the last 168 hours.  DDimer No results for input(s): DDIMER in the last 168 hours.  Radiology/Studies:  Dg Chest Port 1 View  Result Date: 03/14/2018 IMPRESSION: No active disease. Electronically Signed   By: Elgie Collard M.D.   On: 03/14/2018 00:39    Assessment and Plan:   1. Severe anemia with GI bleed: -Plavix and ASA on hold per IM and GI -GI to evaluate -No recent PCI, patient is s/p CABG -Maintain HGB > 8.5-10  2. Elevated troponin: -Likely supply demand ischemia in the setting of severe anemia -No chest pain -Continue to cycle until peaks -No indication for heparin gtt in the setting of supply demand ischemia  and severe anemia  3. CAD s/p 3-vessel CABG: -No chest pain -DAPT held as above -No plans for ischemic evaluation at this time  4. Chronic systolic CHF due to ICM: -He does not appear grossly volume overloaded -Monitor with transfusions  -Losartan, Lasix, metoprolol  5. Aortic stenosis s/p bioprosthetic AVR: -Follow up outpatient   6. Chronic respitatory failure with hypoxia with COPD: -Stable -Likely exacerbated by his anemia  7. HLD: -Lipitor and Zetia  8. DM2: -Per IM   For questions or updates, please contact [facility] Please consult [email or web address] for contact info under Cardiology/STEMI.   Signed, Eula Listen, PA-C [facility] Pager: [phone] 03/14/2018, 7:09 AM

## 2018-03-14 NOTE — Progress Notes (Signed)
[employer] [facility] [place] [facility]   PATIENT NAME: Harold Parker    MR#:  [id number]  DATE OF BIRTH:  June 09, 1946  SUBJECTIVE:   Patient presented to the hospital due to shortness of breath and exertional dyspnea and noted to have a GI bleed.  Patient was noted to have significant anemia with a hemoglobin of 4.9. Being Transfused and Hg. Improved today to 6.9.  Pt. Denies and chest pain presently.  No acute bleeding presently.   REVIEW OF SYSTEMS:    Review of Systems  Constitutional: Negative for chills and fever.  HENT: Negative for congestion and tinnitus.   Eyes: Negative for blurred vision and double vision.  Respiratory: Negative for cough, shortness of breath and wheezing.   Cardiovascular: Negative for chest pain, orthopnea and PND.  Gastrointestinal: Negative for abdominal pain, diarrhea, nausea and vomiting.  Genitourinary: Negative for dysuria and hematuria.  Neurological: Positive for weakness (generalized. ). Negative for dizziness, sensory change and focal weakness.  All other systems reviewed and are negative.   Nutrition: Carb modified Tolerating Diet: Yes Tolerating PT: Await Eval.    DRUG ALLERGIES:  No Known Allergies  VITALS:  Blood pressure (!) 113/43, pulse 79, temperature 98.6 F (37 C), temperature source Oral, resp. rate 20, height 5\' 5"  (1.651 m), weight 71.8 kg (158 lb 3.2 oz), SpO2 100 %.  PHYSICAL EXAMINATION:   Physical Exam  GENERAL:  72 y.o.-year-old patient lying in bed in no acute distress.  EYES: Pupils equal, round, reactive to light and accommodation. No scleral icterus. Extraocular muscles intact.  Pale conjunctiva HEENT: Head atraumatic, normocephalic. Oropharynx and nasopharynx clear.  NECK:  Supple, no jugular venous distention. No thyroid enlargement, no tenderness.  LUNGS: Normal breath sounds bilaterally, no wheezing, rales, rhonchi. No use of accessory muscles of respiration.  CARDIOVASCULAR: S1, S2 normal. No  murmurs, rubs, or gallops.  ABDOMEN: Soft, nontender, nondistended. Bowel sounds present. No organomegaly or mass.  EXTREMITIES: No cyanosis, clubbing or edema b/l.    NEUROLOGIC: Cranial nerves II through XII are intact. No focal Motor or sensory deficits b/l.   PSYCHIATRIC: The patient is alert and oriented x 3.  SKIN: No obvious rash, lesion, or ulcer.    LABORATORY PANEL:   CBC Recent Labs  Lab 03/14/18 0754 03/14/18 1457  WBC 13.9*  --   HGB 5.9* 6.9*  HCT 18.0* 20.3*  PLT 384  --    ------------------------------------------------------------------------------------------------------------------  Chemistries  Recent Labs  Lab 03/14/18 0754  NA 137  K 5.2*  CL 106  CO2 23  GLUCOSE 164*  BUN 65*  CREATININE 1.00  CALCIUM 8.9   ------------------------------------------------------------------------------------------------------------------  Cardiac Enzymes Recent Labs  Lab 03/14/18 0754  TROPONINI 8.10*   ------------------------------------------------------------------------------------------------------------------  RADIOLOGY:  Dg Chest Port 1 View  Result Date: 03/14/2018 CLINICAL DATA:  72 year old male with shortness of breath. EXAM: PORTABLE CHEST 1 VIEW COMPARISON:  Chest radiograph dated 02/22/2018 FINDINGS: The lungs are clear. There is no pleural effusion or pneumothorax. The cardiac silhouette is within normal limits. Median sternotomy wires, CABG vascular clips, and coronary stent noted. No acute osseous pathology. IMPRESSION: No active disease. Electronically Signed   By: Elgie Collard M.D.   On: 03/14/2018 00:39     ASSESSMENT AND PLAN:   72 yo male w/ hx of CAD s/p recent CABG, Ischemic CM, DM, HTN, Hyperlipidemia, COPD, who presented to the hospital due to shortness of breath, exertional dyspnea and noted to have significant Anemia and suspected GI bleed.  1. GI bleed - this is the cause of pt's anemia.  ?? Upper GI (vs) AVM's.  - pt.  Has not acute bleeding presently. Cont. Blood transfusions and try to keep Hg. Close to 10.  - seen by GI and no plans of intervention due to Elevated Trop and also pt. Being on Plavix recently.  - supportive care with blood transfusion, PPI IV BID - if has any active bleeding then consider Bleeding Scan/CTA  2. Acute bleed loss anemia - due to GI bleed.  - cont. Blood transfusion and follow Hg. Keep Hg. Close to 10 if possible - cont. To hold ASA, Plavix for now.   3. Elevated Trop - ?? NSTEMI (vs) Demand ischemia.   - likely demand ischemia from severe anemia.  No chest pain and no ECG changes.  - discussed with Dr. Mariah Milling and hold off on any anticoagulation acute GI bleeding and anemia.  Hold off, aspirin, Plavix. -Consider repeating echocardiogram but will defer this to cardiology.  Continue high-dose statin with atorvastatin, metoprolol, losartan.    4.  Essential hypertension-continue losartan, metoprolol.  5.  Diabetes type 2 without complication-continue sliding scale insulin.  Follow blood sugars.  6. Hyperlipidemia-continue atorvastatin, Zetia.  7.  Anxiety-continue Xanax as needed.   All the records are reviewed and case discussed with Care Management/Social Worker. Management plans discussed with the patient, family and they are in agreement.  CODE STATUS: Full code  DVT Prophylaxis: Ted's & SCD's.   TOTAL TIME TAKING CARE OF THIS PATIENT: 35 minutes.   POSSIBLE D/C IN 2-3 DAYS, DEPENDING ON CLINICAL CONDITION.   Houston Siren M.D on 03/14/2018 at 4:48 PM  Between 7am to 6pm - Pager - [phone]  After 6pm go to [email or web address] - Social research officer, government  [facility] [place] [facility]  Office  [phone]  CC: Primary care physician; System, Pcp Not In

## 2018-03-15 DIAGNOSIS — I5022 Chronic systolic (congestive) heart failure: Secondary | ICD-10-CM

## 2018-03-15 DIAGNOSIS — I2510 Atherosclerotic heart disease of native coronary artery without angina pectoris: Secondary | ICD-10-CM

## 2018-03-15 DIAGNOSIS — R748 Abnormal levels of other serum enzymes: Secondary | ICD-10-CM

## 2018-03-15 LAB — CBC
HCT: 23.4 % — ABNORMAL LOW (ref 40.0–52.0)
Hemoglobin: 7.8 g/dL — ABNORMAL LOW (ref 13.0–18.0)
MCH: 29.3 pg (ref 26.0–34.0)
MCHC: 33.5 g/dL (ref 32.0–36.0)
MCV: 87.6 fL (ref 80.0–100.0)
Platelets: 344 10*3/uL (ref 150–440)
RBC: 2.68 MIL/uL — ABNORMAL LOW (ref 4.40–5.90)
RDW: 19.4 % — ABNORMAL HIGH (ref 11.5–14.5)
WBC: 12.3 10*3/uL — ABNORMAL HIGH (ref 3.8–10.6)

## 2018-03-15 LAB — BASIC METABOLIC PANEL
Anion gap: 6 (ref 5–15)
BUN: 37 mg/dL — ABNORMAL HIGH (ref 6–20)
CO2: 23 mmol/L (ref 22–32)
Calcium: 8.3 mg/dL — ABNORMAL LOW (ref 8.9–10.3)
Chloride: 106 mmol/L (ref 101–111)
Creatinine, Ser: 0.93 mg/dL (ref 0.61–1.24)
GFR calc Af Amer: >60 mL/min (ref >60)
GFR calc non Af Amer: >60 mL/min (ref >60)
Glucose, Bld: 179 mg/dL — ABNORMAL HIGH (ref 65–99)
Potassium: 4.6 mmol/L (ref 3.5–5.1)
Sodium: 135 mmol/L (ref 135–145)

## 2018-03-15 LAB — PREPARE RBC (CROSSMATCH)

## 2018-03-15 LAB — GLUCOSE, CAPILLARY
Glucose-Capillary: 218 mg/dL — ABNORMAL HIGH (ref 65–99)
Glucose-Capillary: 174 mg/dL — ABNORMAL HIGH (ref 65–99)
Glucose-Capillary: 175 mg/dL — ABNORMAL HIGH (ref 65–99)
Glucose-Capillary: 170 mg/dL — ABNORMAL HIGH (ref 65–99)

## 2018-03-15 MED ORDER — ACETAMINOPHEN 325 MG PO TABS
650.0000 mg | ORAL_TABLET | Freq: Once | ORAL | Status: AC
  Administered 2018-03-15: 650 mg via ORAL
  Filled 2018-03-15: qty 2

## 2018-03-15 MED ORDER — SODIUM CHLORIDE 0.9 % IV SOLN
400.0000 mg | Freq: Once | INTRAVENOUS | Status: AC
  Administered 2018-03-15: 400 mg via INTRAVENOUS
  Filled 2018-03-15: qty 20

## 2018-03-15 MED ORDER — SODIUM CHLORIDE 0.9 % IV SOLN
Freq: Once | INTRAVENOUS | Status: AC
  Administered 2018-03-15: 13:00:00 via INTRAVENOUS

## 2018-03-15 MED ORDER — SODIUM CHLORIDE 0.9% FLUSH
3.0000 mL | Freq: Two times a day (BID) | INTRAVENOUS | Status: DC
  Administered 2018-03-16 – 2018-03-18 (×4): 3 mL via INTRAVENOUS

## 2018-03-15 MED ORDER — FUROSEMIDE 10 MG/ML IJ SOLN
20.0000 mg | Freq: Once | INTRAMUSCULAR | Status: AC
  Administered 2018-03-15: 20 mg via INTRAVENOUS

## 2018-03-15 NOTE — Progress Notes (Signed)
Progress Note  Patient Name: Harold Parker Date of Encounter: 03/15/2018  Primary Cardiologist: No primary care provider on file.   Subjective   Pt's breathing is OK   No CP   Never had    Inpatient Medications    Scheduled Meds: . atorvastatin  80 mg Oral q1800  . cholecalciferol  1,000 Units Oral BH-q7a  . docusate sodium  100 mg Oral BID  . ezetimibe  10 mg Oral Daily  . folic acid  500 mcg Oral QPM  . furosemide  40 mg Oral Daily  . insulin aspart  0-5 Units Subcutaneous QHS  . insulin aspart  0-9 Units Subcutaneous TID WC  . losartan  100 mg Oral Daily  . metoprolol tartrate  50 mg Oral BID  . pantoprazole (PROTONIX) IV  40 mg Intravenous Q12H  . vitamin C  1,000 mg Oral Daily   Continuous Infusions: . sodium chloride Stopped (03/14/18 0621)   PRN Meds:  Vital Signs    Vitals:   03/14/18 1957 03/15/18 0433 03/15/18 0806 03/15/18 0829  BP: (!) 126/40 (!) 126/50 (!) 119/105 (!) 116/51  Pulse: (!) 102 94  87  Resp: 17 18 16 18   Temp: 98.3 F (36.8 C) 98.6 F (37 C) 97.9 F (36.6 C) 98.4 F (36.9 C)  TempSrc: Oral Oral Oral Oral  SpO2: 100% 99% 98% 98%  Weight:  156 lb 12.8 oz (71.1 kg)    Height:        Intake/Output Summary (Last 24 hours) at 03/15/2018 1023 Last data filed at 03/15/2018 8768 Gross per 24 hour  Intake 1202 ml  Output 3475 ml  Net -2273 ml   Filed Weights   03/13/18 2344 03/14/18 0200 03/15/18 0433  Weight: 160 lb (72.6 kg) 158 lb 3.2 oz (71.8 kg) 156 lb 12.8 oz (71.1 kg)    Telemetry    SR   - Personally Reviewed  ECG      Physical Exam   GEN: No acute distress.   Neck: No JVD Cardiac: RRR, no murmurs, rubs, or gallops.  Respiratory: Clear to auscultation bilaterally. GI: Soft, nontender, non-distended  MS: No edema; No deformity. Neuro:  Nonfocal  Psych: Normal affect   Labs    Chemistry Recent Labs  Lab 03/13/18 2351 03/14/18 0754 03/15/18 0617  NA 134* 137 135  K 4.9 5.2* 4.6  CL 103 106 106  CO2 13*  23 23  GLUCOSE 267* 164* 179*  BUN 61* 65* 37*  CREATININE 1.19 1.00 0.93  CALCIUM 8.8* 8.9 8.3*  GFRNONAA 59* >60 >60  GFRAA >60 >60 >60  ANIONGAP 18* 8 6     Hematology Recent Labs  Lab 03/13/18 2351 03/14/18 0754 03/14/18 1457 03/15/18 0617  WBC 17.3* 13.9*  --  12.3*  RBC 1.54* 1.94*  --  2.68*  HGB 4.7* 5.9* 6.9* 7.8*  HCT 15.3* 18.0* 20.3* 23.4*  MCV 99.4 92.6  --  87.6  MCH 30.5 30.4  --  29.3  MCHC 30.7* 32.8  --  33.5  RDW 18.6* 16.2*  --  19.4*  PLT 487* 384  --  344    Cardiac Enzymes Recent Labs  Lab 03/13/18 2351 03/14/18 0754  TROPONINI 0.42* 8.10*   No results for input(s): TROPIPOC in the last 168 hours.   BNPNo results for input(s): BNP, PROBNP in the last 168 hours.   DDimer No results for input(s): DDIMER in the last 168 hours.   Radiology    Dg Chest  Port 1 View  Result Date: 03/14/2018 CLINICAL DATA:  72 year old male with shortness of breath. EXAM: PORTABLE CHEST 1 VIEW COMPARISON:  Chest radiograph dated 02/22/2018 FINDINGS: The lungs are clear. There is no pleural effusion or pneumothorax. The cardiac silhouette is within normal limits. Median sternotomy wires, CABG vascular clips, and coronary stent noted. No acute osseous pathology. IMPRESSION: No active disease. Electronically Signed   By: Elgie Collard M.D.   On: 03/14/2018 00:39    Cardiac Studies    CATH 02/06/2018   There is mild to moderate left ventricular systolic dysfunction.  LV end diastolic pressure is severely elevated.  Ost RCA to Prox RCA lesion is 100% stenosed.  Mid LM to Dist LM lesion is 85% stenosed.  Ost 1st Mrg lesion is 95% stenosed.  Prox LAD to Mid LAD lesion is 40% stenosed.  Prox LAD lesion is 50% stenosed.  Ost 1st Diag to 1st Diag lesion is 60% stenosed.  1. Severe left main and three-vessel coronary artery disease with chronically occluded right coronary artery with left-to-right collaterals. The coronary arteries are overall heavily  calcified and diffusely diseased. 2. Mildly reduced LV systolic function with an EF of 40%. Echocardiogram showed wall motion abnormalities in the anterior as well as inferior walls.  3. Severely elevated filling pressures in the setting of severely elevated blood pressure. 4. Moderate aortic stenosis with a peak to peak gradient of 20 mmHg.   Patient Profile     72 y.o. male long history of CAD (dates to 39s)  He is s/p NSTEMI in Feb  LHC as noted above  Underwent CABG (LIMA to LAD; SVG to D1; SVG to OM) and AVR (stented bovine bioprosthesis) on 02/19/18.  Post op had afib   Rx amiodarone   D/C on ASA and Plavix    Assessment & Plan    1  Severe anemia with GI bleed  CBC is improving   7.8 this AM    Follow  Transfuse if drops   Hold antiplt agents    2  Elevated troponin in setting of anemia and CAD   Rx limited with anemia  CP free  3  CAD, s/p 3 V CABG     REcent NSTEMI and CABG   Now off of ASA and Plavix   WIll need to follow   H/H   Determine when safe to consider ASA Rx alone    4  Chronic systolic CHF  Volume is not bad     5  AS  S/p AVR (bioprosthesis)    For questions or updates, please contact [facility] Please consult [email or web address] for contact info under Cardiology/STEMI.      Signed, Dietrich Pates, MD  03/15/2018, 10:23 AM

## 2018-03-15 NOTE — Progress Notes (Signed)
Patient ID: Harold Parker, male   DOB: [birth year], 72 y.o.   MRN: [id number]  [employer] [facility] PROGRESS NOTE  Auburn Hert NWG:[id number] DOB: 1946-07-26 DOA: 03/13/2018 PCP: System, Pcp Not In  HPI/Subjective: Patient feeling better.  Still weak.  Has not seen any further black stools.  No complaints of chest pain currently.  Objective: Vitals:   03/15/18 0806 03/15/18 0829  BP: (!) 119/105 (!) 116/51  Pulse:  87  Resp: 16 18  Temp: 97.9 F (36.6 C) 98.4 F (36.9 C)  SpO2: 98% 98%    Filed Weights   03/13/18 2344 03/14/18 0200 03/15/18 0433  Weight: 72.6 kg (160 lb) 71.8 kg (158 lb 3.2 oz) 71.1 kg (156 lb 12.8 oz)    ROS: Review of Systems  Constitutional: Negative for chills and fever.  Eyes: Negative for blurred vision.  Respiratory: Negative for cough and shortness of breath.   Cardiovascular: Negative for chest pain.  Gastrointestinal: Negative for abdominal pain, constipation, diarrhea, nausea and vomiting.  Genitourinary: Negative for dysuria.  Musculoskeletal: Negative for joint pain.  Neurological: Negative for dizziness and headaches.   Exam: Physical Exam  Constitutional: He is oriented to person, place, and time.  HENT:  Nose: No mucosal edema.  Mouth/Throat: No oropharyngeal exudate or posterior oropharyngeal edema.  Eyes: Pupils are equal, round, and reactive to light. Conjunctivae, EOM and lids are normal.  Neck: No JVD present. Carotid bruit is not present. No edema present. No thyroid mass and no thyromegaly present.  Cardiovascular: S1 normal and S2 normal. Exam reveals no gallop.  Murmur heard.  Systolic murmur is present with a grade of 2/6. Pulses:      Dorsalis pedis pulses are 2+ on the right side, and 2+ on the left side.  Respiratory: No respiratory distress. He has decreased breath sounds in the right lower field and the left lower field. He has no wheezes. He has no rhonchi. He has no rales.  GI: Soft. Bowel sounds are normal. There is no  tenderness.  Musculoskeletal:       Right ankle: He exhibits swelling.       Left ankle: He exhibits swelling.  Lymphadenopathy:    He has no cervical adenopathy.  Neurological: He is alert and oriented to person, place, and time. No cranial nerve deficit.  Skin: Skin is warm. No rash noted. Nails show no clubbing.  Psychiatric: He has a normal mood and affect.      Data Reviewed: Basic Metabolic Panel: Recent Labs  Lab 03/11/18 1634 03/13/18 2351 03/14/18 0754 03/15/18 0617  NA 138 134* 137 135  K 4.6 4.9 5.2* 4.6  CL 107 103 106 106  CO2 21* 13* 23 23  GLUCOSE 95 267* 164* 179*  BUN 46* 61* 65* 37*  CREATININE 1.08 1.19 1.00 0.93  CALCIUM 8.8* 8.8* 8.9 8.3*   Liver Function Tests: No results for input(s): AST, ALT, ALKPHOS, BILITOT, PROT, ALBUMIN in the last 168 hours. No results for input(s): LIPASE, AMYLASE in the last 168 hours. No results for input(s): AMMONIA in the last 168 hours. CBC: Recent Labs  Lab 03/13/18 2351 03/14/18 0754 03/14/18 1457 03/15/18 0617  WBC 17.3* 13.9*  --  12.3*  NEUTROABS 12.8*  --   --   --   HGB 4.7* 5.9* 6.9* 7.8*  HCT 15.3* 18.0* 20.3* 23.4*  MCV 99.4 92.6  --  87.6  PLT 487* 384  --  344   Cardiac Enzymes: Recent Labs  Lab 03/13/18 2351 03/14/18 0754  TROPONINI 0.42* 8.10*   BNP (last 3 results) Recent Labs    02/18/18 0402  BNP 167.8*    ProBNP (last 3 results) No results for input(s): PROBNP in the last 8760 hours.  CBG: Recent Labs  Lab 03/14/18 1207 03/14/18 1704 03/14/18 2156 03/15/18 0830 03/15/18 1207  GLUCAP 133* 189* 181* 218* 174*    No results found for this or any previous visit (from the past 240 hour(s)).   Studies: Dg Chest Port 1 View  Result Date: 03/14/2018 CLINICAL DATA:  72 year old male with shortness of breath. EXAM: PORTABLE CHEST 1 VIEW COMPARISON:  Chest radiograph dated 02/22/2018 FINDINGS: The lungs are clear. There is no pleural effusion or pneumothorax. The cardiac  silhouette is within normal limits. Median sternotomy wires, CABG vascular clips, and coronary stent noted. No acute osseous pathology. IMPRESSION: No active disease. Electronically Signed   By: Elgie Collard M.D.   On: 03/14/2018 00:39    Scheduled Meds: . atorvastatin  80 mg Oral q1800  . cholecalciferol  1,000 Units Oral BH-q7a  . docusate sodium  100 mg Oral BID  . ezetimibe  10 mg Oral Daily  . folic acid  500 mcg Oral QPM  . furosemide  40 mg Oral Daily  . insulin aspart  0-5 Units Subcutaneous QHS  . insulin aspart  0-9 Units Subcutaneous TID WC  . losartan  100 mg Oral Daily  . metoprolol tartrate  50 mg Oral BID  . pantoprazole (PROTONIX) IV  40 mg Intravenous Q12H  . vitamin C  1,000 mg Oral Daily   Continuous Infusions: . sodium chloride Stopped (03/14/18 0621)  . iron sucrose 400 mg (03/15/18 1300)    Assessment/Plan:  1. Acute blood loss anemia secondary to GI bleed.  I will transfuse another unit of packed red blood cells today to get hemoglobin above 8.  I will also give some IV iron today.  Gastroenterology not wanting to do a procedure until Plavix out of his system. 2. Elevated troponin likely demand ischemia in the setting of severe anemia.  Patient with recent CABG.  Unable to give any blood thinners at this time. 3. Essential hypertension on losartan and metoprolol 4. Type 2 diabetes mellitus on sliding scale insulin 5. Hyperlipidemia unspecified on atorvastatin and Zetia 6. Anxiety on Xanax 7. Chronic respiratory failure.  Patient sent home with oxygen last hospitalization.  Check a pulse ox on room air with ambulation to see if he still requires oxygen.  Code Status:     Code Status Orders  (From admission, onward)        Start     Ordered   03/14/18 0136  Full code  Continuous     03/14/18 0135    Code Status History    Date Active Date Inactive Code Status Order ID Comments User Context   02/07/2018 1636 02/26/2018 2129 Full Code [id number]   Creig Hines, NP Inpatient   02/05/2018 2047 02/07/2018 1608 Full Code [id number]  Shaune Pollack, MD Inpatient    Advance Directive Documentation     Most Recent Value  Type of Advance Directive  Healthcare Power of Attorney, Living will  Pre-existing out of facility DNR order (yellow form or pink MOST form)  -  "MOST" Form in Place?  -     Family Communication: Significant other at the bedside Disposition Plan: To be determined based on clinical course  Consultants:  Gastroenterology  Cardiology  Time spent: 28 minutes  Cola Gane [employer]

## 2018-03-15 NOTE — Progress Notes (Signed)
Pt had hgb 7.9 1 unit rbc's infused. Pt tolerated it well. Premedicated with lasix and tylenol. Wife at bedside. Appetite good. Condom cath draining well. Good cough effort but non productive.

## 2018-03-15 NOTE — Progress Notes (Signed)
Wyline Mood , MD [address], [place], [place], [address] [address], [address], [place], [place], [address] Phone: [phone]  Fax: [phone]   Harold Parker is being followed for GI bleed  Day 1 of follow up   Subjective: No bleeding , not had a bowel movement eating and drinking    Objective: Vital signs in last 24 hours: Vitals:   03/14/18 1957 03/15/18 0433 03/15/18 0806 03/15/18 0829  BP: (!) 126/40 (!) 126/50 (!) 119/105 (!) 116/51  Pulse: (!) 102 94  87  Resp: 17 18 16 18   Temp: 98.3 F (36.8 C) 98.6 F (37 C) 97.9 F (36.6 C) 98.4 F (36.9 C)  TempSrc: Oral Oral Oral Oral  SpO2: 100% 99% 98% 98%  Weight:  156 lb 12.8 oz (71.1 kg)    Height:       Weight change: -3 lb 3.2 oz (-1.452 kg)  Intake/Output Summary (Last 24 hours) at 03/15/2018 1309 Last data filed at 03/15/2018 5784 Gross per 24 hour  Intake 480 ml  Output 1275 ml  Net -795 ml     Exam: Heart:: Regular rate and rhythm, S1S2 present or without murmur or extra heart sounds Lungs: normal, clear to auscultation and clear to auscultation and percussion Abdomen: soft, nontender, normal bowel sounds   Lab Results: @LABTEST2 @ Micro Results: No results found for this or any previous visit (from the past 240 hour(s)). Studies/Results: Dg Chest Port 1 View  Result Date: 03/14/2018 CLINICAL DATA:  72 year old male with shortness of breath. EXAM: PORTABLE CHEST 1 VIEW COMPARISON:  Chest radiograph dated 02/22/2018 FINDINGS: The lungs are clear. There is no pleural effusion or pneumothorax. The cardiac silhouette is within normal limits. Median sternotomy wires, CABG vascular clips, and coronary stent noted. No acute osseous pathology. IMPRESSION: No active disease. Electronically Signed   By: Elgie Collard M.D.   On: 03/14/2018 00:39   Medications: I have reviewed the patient's current medications. Scheduled Meds: . atorvastatin  80 mg Oral q1800  . cholecalciferol  1,000 Units  Oral BH-q7a  . docusate sodium  100 mg Oral BID  . ezetimibe  10 mg Oral Daily  . folic acid  500 mcg Oral QPM  . furosemide  20 mg Intravenous Once  . furosemide  40 mg Oral Daily  . insulin aspart  0-5 Units Subcutaneous QHS  . insulin aspart  0-9 Units Subcutaneous TID WC  . losartan  100 mg Oral Daily  . metoprolol tartrate  50 mg Oral BID  . pantoprazole (PROTONIX) IV  40 mg Intravenous Q12H  . vitamin C  1,000 mg Oral Daily   Continuous Infusions: . sodium chloride Stopped (03/14/18 0621)  . sodium chloride    . iron sucrose 400 mg (03/15/18 1300)   PRN Meds:.acetaminophen **OR** acetaminophen, ALPRAZolam, bisacodyl, HYDROcodone-acetaminophen, ipratropium-albuterol, ondansetron **OR** ondansetron (ZOFRAN) IV, traMADol, traZODone   Assessment: Active Problems:   Severe anemia   Harold Parker 72 y.o. male S/p CABG 02/2018 after NSTEMI 02/06/2018 , Aortic stenosis S/p bioprostehetic AVR,COPD presented with weakness and found to have a Hb pf 4.7 grams , melena . He has been on Plavix , last dose of Plavix on 03/13/18 .   Troponin elevated to 8.0 . S/p transfusion and Hb 7.8 grams , off plavix for now .    Plan: 1. Transfuse to target per cardiology recs 2. PPI 3. Earliest endoscopy can be done would be 5 days after last dose of Plavix. ie 03/19/18 .  4. If has further bleeding in the interim suggest tagged RBC scan.     LOS: 1 day   Wyline Mood, MD 03/15/2018, 1:09 PM

## 2018-03-16 LAB — HEMOGLOBIN: Hemoglobin: 9.7 g/dL — ABNORMAL LOW (ref 13.0–18.0)

## 2018-03-16 LAB — BPAM RBC
Blood Product Expiration Date: 201904042359
Blood Product Expiration Date: 201904042359
Blood Product Expiration Date: 201904042359
Blood Product Expiration Date: 201904052359
ISSUE DATE / TIME: 201903290204
ISSUE DATE / TIME: 201903290952
ISSUE DATE / TIME: 201903291637
ISSUE DATE / TIME: 201903301450
Unit Type and Rh: 9500
Unit Type and Rh: 9500
Unit Type and Rh: 9500
Unit Type and Rh: 9500

## 2018-03-16 LAB — TYPE AND SCREEN
ABO/RH(D): O NEG
Antibody Screen: NEGATIVE
Unit division: 0
Unit division: 0
Unit division: 0
Unit division: 0

## 2018-03-16 LAB — GLUCOSE, CAPILLARY
Glucose-Capillary: 174 mg/dL — ABNORMAL HIGH (ref 65–99)
Glucose-Capillary: 165 mg/dL — ABNORMAL HIGH (ref 65–99)
Glucose-Capillary: 167 mg/dL — ABNORMAL HIGH (ref 65–99)
Glucose-Capillary: 170 mg/dL — ABNORMAL HIGH (ref 65–99)

## 2018-03-16 NOTE — Progress Notes (Signed)
Patient ID: Harold Parker, male   DOB: [birth year], 72 y.o.   MRN: [id number]  [employer] PROGRESS NOTE  Indy Provost RWE:[id number] DOB: [birth year] DOA: 03/13/2018 PCP: System, Pcp Not In  HPI/Subjective: Patient feeling better.  Still weak.  No complaints of chest pain.  Coughing a little bit.  No further bleeding.  Objective: Vitals:   03/15/18 1937 03/16/18 0517  BP: (!) 125/52 (!) 124/47  Pulse: 91 89  Resp: 17 18  Temp: 99 F (37.2 C) 98.8 F (37.1 C)  SpO2: 99% 96%    Filed Weights   03/14/18 0200 03/15/18 0433 03/16/18 0517  Weight: 71.8 kg (158 lb 3.2 oz) 71.1 kg (156 lb 12.8 oz) 69.1 kg (152 lb 4.8 oz)    ROS: Review of Systems  Constitutional: Negative for chills and fever.  Eyes: Negative for blurred vision.  Respiratory: Positive for cough. Negative for shortness of breath.   Cardiovascular: Negative for chest pain.  Gastrointestinal: Negative for abdominal pain, constipation, diarrhea, nausea and vomiting.  Genitourinary: Negative for dysuria.  Musculoskeletal: Negative for joint pain.  Neurological: Negative for dizziness and headaches.   Exam: Physical Exam  Constitutional: He is oriented to person, place, and time.  HENT:  Nose: No mucosal edema.  Mouth/Throat: No oropharyngeal exudate or posterior oropharyngeal edema.  Eyes: Pupils are equal, round, and reactive to light. Conjunctivae, EOM and lids are normal.  Neck: No JVD present. Carotid bruit is not present. No edema present. No thyroid mass and no thyromegaly present.  Cardiovascular: S1 normal and S2 normal. Exam reveals no gallop.  Murmur heard.  Systolic murmur is present with a grade of 2/6. Pulses:      Dorsalis pedis pulses are 2+ on the right side, and 2+ on the left side.  Respiratory: No respiratory distress. He has decreased breath sounds in the right lower field and the left lower field. He has no wheezes. He has no rhonchi. He has no rales.  GI: Soft. Bowel sounds are normal.  There is no tenderness.  Musculoskeletal:       Right ankle: He exhibits swelling.       Left ankle: He exhibits swelling.  Lymphadenopathy:    He has no cervical adenopathy.  Neurological: He is alert and oriented to person, place, and time. No cranial nerve deficit.  Skin: Skin is warm. No rash noted. Nails show no clubbing.  Psychiatric: He has a normal mood and affect.      Data Reviewed: Basic Metabolic Panel: Recent Labs  Lab 03/11/18 1634 03/13/18 2351 03/14/18 0754 03/15/18 0617  NA 138 134* 137 135  K 4.6 4.9 5.2* 4.6  CL 107 103 106 106  CO2 21* 13* 23 23  GLUCOSE 95 267* 164* 179*  BUN 46* 61* 65* 37*  CREATININE 1.08 1.19 1.00 0.93  CALCIUM 8.8* 8.8* 8.9 8.3*   CBC: Recent Labs  Lab 03/13/18 2351 03/14/18 0754 03/14/18 1457 03/15/18 0617 03/16/18 0446  WBC 17.3* 13.9*  --  12.3*  --   NEUTROABS 12.8*  --   --   --   --   HGB 4.7* 5.9* 6.9* 7.8* 9.7*  HCT 15.3* 18.0* 20.3* 23.4*  --   MCV 99.4 92.6  --  87.6  --   PLT 487* 384  --  344  --    Cardiac Enzymes: Recent Labs  Lab 03/13/18 2351 03/14/18 0754  TROPONINI 0.42* 8.10*   BNP (last 3 results) Recent Labs    02/18/18 0402  BNP 167.8*    CBG: Recent Labs  Lab 03/15/18 1207 03/15/18 1707 03/15/18 2143 03/16/18 0750 03/16/18 1217  GLUCAP 174* 175* 170* 174* 165*    Scheduled Meds: . atorvastatin  80 mg Oral q1800  . cholecalciferol  1,000 Units Oral BH-q7a  . docusate sodium  100 mg Oral BID  . ezetimibe  10 mg Oral Daily  . folic acid  500 mcg Oral QPM  . furosemide  40 mg Oral Daily  . insulin aspart  0-5 Units Subcutaneous QHS  . insulin aspart  0-9 Units Subcutaneous TID WC  . losartan  100 mg Oral Daily  . metoprolol tartrate  50 mg Oral BID  . pantoprazole (PROTONIX) IV  40 mg Intravenous Q12H  . sodium chloride flush  3 mL Intravenous Q12H  . vitamin C  1,000 mg Oral Daily   Continuous Infusions: . sodium chloride Stopped (03/14/18 1610)     Assessment/Plan:  1. Acute blood loss anemia secondary to GI bleed.  Patient responded well to transfusions.  Hemoglobin up to 9.7.  Patient also given IV iron yesterday.  Case discussed with Dr. Tobi Bastos gastroenterology.  Earliest endoscopy would be on Tuesday. 2. Elevated troponin likely demand ischemia in the setting of severe anemia.  Patient with recent CABG.  Unable to give any blood thinners at this time. 3. Essential hypertension on losartan and metoprolol 4. Type 2 diabetes mellitus on sliding scale insulin 5. Hyperlipidemia unspecified on atorvastatin and Zetia 6. Anxiety on Xanax 7. Chronic respiratory failure.  Patient sent home with oxygen last hospitalization.  Check a pulse ox on room air with ambulation to see if he still requires oxygen.  Code Status:     Code Status Orders  (From admission, onward)        Start     Ordered   03/14/18 0136  Full code  Continuous     03/14/18 0135    Code Status History    Date Active Date Inactive Code Status Order ID Comments User Context   02/07/2018 1636 02/26/2018 2129 Full Code [id number]  Creig Hines, NP Inpatient   02/05/2018 2047 02/07/2018 1608 Full Code [id number]  Shaune Pollack, MD Inpatient    Advance Directive Documentation     Most Recent Value  Type of Advance Directive  Healthcare Power of Attorney, Living will  Pre-existing out of facility DNR order (yellow form or pink MOST form)  -  "MOST" Form in Place?  -     Family Communication: Spoke with significant other at the nursing station. Disposition Plan: Potentially out of the hospital Tuesday afternoon depending on endoscopy results.  Consultants:  Gastroenterology  Cardiology  Time spent: 28 minutes, case discussed with gastroenterology  Alford Highland  [employer]

## 2018-03-16 NOTE — Progress Notes (Signed)
Progress Note  Patient Name: Harold Parker Date of Encounter: 03/16/2018  Primary Cardiologist: Kirke Corin   Subjective   No Cp   Breathing is OK    Inpatient Medications    Scheduled Meds: . atorvastatin  80 mg Oral q1800  . cholecalciferol  1,000 Units Oral BH-q7a  . docusate sodium  100 mg Oral BID  . ezetimibe  10 mg Oral Daily  . folic acid  500 mcg Oral QPM  . furosemide  40 mg Oral Daily  . insulin aspart  0-5 Units Subcutaneous QHS  . insulin aspart  0-9 Units Subcutaneous TID WC  . losartan  100 mg Oral Daily  . metoprolol tartrate  50 mg Oral BID  . pantoprazole (PROTONIX) IV  40 mg Intravenous Q12H  . sodium chloride flush  3 mL Intravenous Q12H  . vitamin C  1,000 mg Oral Daily   Continuous Infusions: . sodium chloride Stopped (03/14/18 0621)   PRN Meds:  Vital Signs    Vitals:   03/15/18 1520 03/15/18 1830 03/15/18 1937 03/16/18 0517  BP:   (!) 125/52 (!) 124/47  Pulse: 90 87 91 89  Resp: 18 20 17 18   Temp: 98.7 F (37.1 C) 98.6 F (37 C) 99 F (37.2 C) 98.8 F (37.1 C)  TempSrc: Oral Oral Oral Oral  SpO2: 96% 98% 99% 96%  Weight:    152 lb 4.8 oz (69.1 kg)  Height:        Intake/Output Summary (Last 24 hours) at 03/16/2018 4239 Last data filed at 03/16/2018 0400 Gross per 24 hour  Intake 1935 ml  Output 2625 ml  Net -690 ml   Filed Weights   03/14/18 0200 03/15/18 0433 03/16/18 0517  Weight: 158 lb 3.2 oz (71.8 kg) 156 lb 12.8 oz (71.1 kg) 152 lb 4.8 oz (69.1 kg)    Telemetry    SR   - Personally Reviewed  ECG      Physical Exam   GEN: No acute distress.   Neck: JVP normal   Cardiac: RRR, no murmurs, rubs, or gallops.  Respiratory: Clear to auscultation bilaterally. GI: Soft, nontender, non-distended  MS: No edema; No deformity. Neuro:  Nonfocal  Psych: Normal affect   Labs    Chemistry Recent Labs  Lab 03/13/18 2351 03/14/18 0754 03/15/18 0617  NA 134* 137 135  K 4.9 5.2* 4.6  CL 103 106 106  CO2 13* 23 23    GLUCOSE 267* 164* 179*  BUN 61* 65* 37*  CREATININE 1.19 1.00 0.93  CALCIUM 8.8* 8.9 8.3*  GFRNONAA 59* >60 >60  GFRAA >60 >60 >60  ANIONGAP 18* 8 6     Hematology Recent Labs  Lab 03/13/18 2351 03/14/18 0754 03/14/18 1457 03/15/18 0617 03/16/18 0446  WBC 17.3* 13.9*  --  12.3*  --   RBC 1.54* 1.94*  --  2.68*  --   HGB 4.7* 5.9* 6.9* 7.8* 9.7*  HCT 15.3* 18.0* 20.3* 23.4*  --   MCV 99.4 92.6  --  87.6  --   MCH 30.5 30.4  --  29.3  --   MCHC 30.7* 32.8  --  33.5  --   RDW 18.6* 16.2*  --  19.4*  --   PLT 487* 384  --  344  --     Cardiac Enzymes Recent Labs  Lab 03/13/18 2351 03/14/18 0754  TROPONINI 0.42* 8.10*   No results for input(s): TROPIPOC in the last 168 hours.   BNPNo results for  input(s): BNP, PROBNP in the last 168 hours.   DDimer No results for input(s): DDIMER in the last 168 hours.   Radiology    No results found.  Cardiac Studies    CATH 02/06/2018   There is mild to moderate left ventricular systolic dysfunction.  LV end diastolic pressure is severely elevated.  Ost RCA to Prox RCA lesion is 100% stenosed.  Mid LM to Dist LM lesion is 85% stenosed.  Ost 1st Mrg lesion is 95% stenosed.  Prox LAD to Mid LAD lesion is 40% stenosed.  Prox LAD lesion is 50% stenosed.  Ost 1st Diag to 1st Diag lesion is 60% stenosed.  1. Severe left main and three-vessel coronary artery disease with chronically occluded right coronary artery with left-to-right collaterals. The coronary arteries are overall heavily calcified and diffusely diseased. 2. Mildly reduced LV systolic function with an EF of 40%. Echocardiogram showed wall motion abnormalities in the anterior as well as inferior walls.  3. Severely elevated filling pressures in the setting of severely elevated blood pressure. 4. Moderate aortic stenosis with a peak to peak gradient of 20 mmHg.   Patient Profile     72 y.o. male long history of CAD (dates to 66s)  He is s/p NSTEMI in  Feb  LHC as noted above  Underwent CABG (LIMA to LAD; SVG to D1; SVG to OM) and AVR (stented bovine bioprosthesis) on 02/19/18.  Post op had afib   Rx amiodarone   D/C on ASA and Plavix    Assessment & Plan    1  Severe anemia with GI bleed  Hgb is improved    Off antiplt agents  Plan for endoscopy after plavix washout (5 days)  2  Elevated troponin in setting of anemia and CAD    Pt pain free      3  CAD, s/p 3 V CABG     REcent NSTEMI and CABG   Now off of ASA and Plavix   WIll need to follow   H/H   Determine when safe to consider ASA Rx alone in future    4  Chronic systolic CHF Volume is OK      5  AS  S/p AVR (bioprosthesis)    For questions or updates, please contact [facility] Please consult [email or web address] for contact info under Cardiology/STEMI.      Signed, Dietrich Pates, MD  03/16/2018, 9:22 AM

## 2018-03-17 DIAGNOSIS — K922 Gastrointestinal hemorrhage, unspecified: Secondary | ICD-10-CM

## 2018-03-17 LAB — GLUCOSE, CAPILLARY
Glucose-Capillary: 161 mg/dL — ABNORMAL HIGH (ref 65–99)
Glucose-Capillary: 169 mg/dL — ABNORMAL HIGH (ref 65–99)
Glucose-Capillary: 148 mg/dL — ABNORMAL HIGH (ref 65–99)
Glucose-Capillary: 202 mg/dL — ABNORMAL HIGH (ref 65–99)

## 2018-03-17 NOTE — Progress Notes (Signed)
Patient ID: Harold Parker, male   DOB: 1946/08/22, 72 y.o.   MRN: [id number]  [employer] PROGRESS NOTE  Harold Parker SUO:[id number] DOB: 01-09-1946 DOA: 03/13/2018 PCP: System, Pcp Not In  HPI/Subjective: Patient feeling fine and offers no complaints.  Objective: Vitals:   03/17/18 0453 03/17/18 0756  BP: 140/62 (!) 151/73  Pulse: 86 94  Resp:  18  Temp: 98.4 F (36.9 C) 98.6 F (37 C)  SpO2: 94% 90%    Filed Weights   03/15/18 0433 03/16/18 0517 03/17/18 0453  Weight: 71.1 kg (156 lb 12.8 oz) 69.1 kg (152 lb 4.8 oz) 71.7 kg (158 lb 1.6 oz)    ROS: Review of Systems  Constitutional: Negative for chills and fever.  Eyes: Negative for blurred vision.  Respiratory: Negative for cough and shortness of breath.   Cardiovascular: Negative for chest pain.  Gastrointestinal: Negative for abdominal pain, constipation, diarrhea, nausea and vomiting.  Genitourinary: Negative for dysuria.  Musculoskeletal: Negative for joint pain.  Neurological: Negative for dizziness and headaches.   Exam: Physical Exam  Constitutional: He is oriented to person, place, and time.  HENT:  Nose: No mucosal edema.  Mouth/Throat: No oropharyngeal exudate or posterior oropharyngeal edema.  Eyes: Pupils are equal, round, and reactive to light. Conjunctivae, EOM and lids are normal.  Neck: No JVD present. Carotid bruit is not present. No edema present. No thyroid mass and no thyromegaly present.  Cardiovascular: S1 normal and S2 normal. Exam reveals no gallop.  Murmur heard.  Systolic murmur is present with a grade of 2/6. Pulses:      Dorsalis pedis pulses are 2+ on the right side, and 2+ on the left side.  Respiratory: No respiratory distress. He has decreased breath sounds in the right lower field and the left lower field. He has no wheezes. He has no rhonchi. He has no rales.  GI: Soft. Bowel sounds are normal. There is no tenderness.  Musculoskeletal:       Right ankle: He exhibits swelling.        Left ankle: He exhibits swelling.  Lymphadenopathy:    He has no cervical adenopathy.  Neurological: He is alert and oriented to person, place, and time. No cranial nerve deficit.  Skin: Skin is warm. No rash noted. Nails show no clubbing.  Psychiatric: He has a normal mood and affect.      Data Reviewed: Basic Metabolic Panel: Recent Labs  Lab 03/11/18 1634 03/13/18 2351 03/14/18 0754 03/15/18 0617  NA 138 134* 137 135  K 4.6 4.9 5.2* 4.6  CL 107 103 106 106  CO2 21* 13* 23 23  GLUCOSE 95 267* 164* 179*  BUN 46* 61* 65* 37*  CREATININE 1.08 1.19 1.00 0.93  CALCIUM 8.8* 8.8* 8.9 8.3*   CBC: Recent Labs  Lab 03/13/18 2351 03/14/18 0754 03/14/18 1457 03/15/18 0617 03/16/18 0446  WBC 17.3* 13.9*  --  12.3*  --   NEUTROABS 12.8*  --   --   --   --   HGB 4.7* 5.9* 6.9* 7.8* 9.7*  HCT 15.3* 18.0* 20.3* 23.4*  --   MCV 99.4 92.6  --  87.6  --   PLT 487* 384  --  344  --    Cardiac Enzymes: Recent Labs  Lab 03/13/18 2351 03/14/18 0754  TROPONINI 0.42* 8.10*   BNP (last 3 results) Recent Labs    02/18/18 0402  BNP 167.8*    CBG: Recent Labs  Lab 03/16/18 1217 03/16/18 1636 03/16/18 2104  03/17/18 0758 03/17/18 1133  GLUCAP 165* 167* 170* 161* 169*    Scheduled Meds: . atorvastatin  80 mg Oral q1800  . cholecalciferol  1,000 Units Oral BH-q7a  . docusate sodium  100 mg Oral BID  . ezetimibe  10 mg Oral Daily  . folic acid  500 mcg Oral QPM  . furosemide  40 mg Oral Daily  . insulin aspart  0-5 Units Subcutaneous QHS  . insulin aspart  0-9 Units Subcutaneous TID WC  . losartan  100 mg Oral Daily  . metoprolol tartrate  50 mg Oral BID  . pantoprazole (PROTONIX) IV  40 mg Intravenous Q12H  . sodium chloride flush  3 mL Intravenous Q12H  . vitamin C  1,000 mg Oral Daily   Continuous Infusions: . sodium chloride Stopped (03/14/18 4540)    Assessment/Plan:  1. Acute blood loss anemia secondary to GI bleed.  Patient responded well to  transfusions.  Hemoglobin up to 9.7.  Patient also given IV iron.  Case discussed with Dr. Servando Snare gastroenterology and he will set up for an endoscopy tomorrow. 2. Elevated troponin likely demand ischemia in the setting of severe anemia.  Patient with recent CABG.  Unable to give any blood thinners at this time. 3. Essential hypertension on losartan and metoprolol 4. Type 2 diabetes mellitus on sliding scale insulin.  Hemoglobin A1c 7.0.  Diet controlled 5. Hyperlipidemia unspecified on atorvastatin and Zetia 6. Anxiety on Xanax 7. Chronic respiratory failure.  Patient able to come off oxygen at this point  Code Status:     Code Status Orders  (From admission, onward)        Start     Ordered   03/14/18 0136  Full code  Continuous     03/14/18 0135    Code Status History    Date Active Date Inactive Code Status Order ID Comments User Context   02/07/2018 1636 02/26/2018 2129 Full Code [id number]  Creig Hines, NP Inpatient   02/05/2018 2047 02/07/2018 1608 Full Code [id number]  Shaune Pollack, MD Inpatient    Advance Directive Documentation     Most Recent Value  Type of Advance Directive  Healthcare Power of Attorney, Living will  Pre-existing out of facility DNR order (yellow form or pink MOST form)  -  "MOST" Form in Place?  -     Disposition Plan: Potentially out of the hospital Tuesday afternoon depending on endoscopy results.  Consultants:  Gastroenterology  Cardiology  Time spent: 27 minutes, case discussed with gastroenterology  Alford Highland  [employer]

## 2018-03-17 NOTE — Progress Notes (Signed)
  Midge Minium, MD [facility]   [address] [place], [place] [address] Phone: [phone] Fax : [phone]   Subjective: The patient came in with dark stools but reports the stools have turned back to normal color today.  The patient was on Plavix previously.  The patient had a heart attack in February and had cardiac bypass in March.  There is concern about putting the patient back on anticoagulation.   Objective: Vital signs in last 24 hours: Vitals:   03/16/18 1543 03/16/18 2002 03/17/18 0453 03/17/18 0756  BP: (!) 122/55 (!) 123/51 140/62 (!) 151/73  Pulse: 95 95 86 94  Resp: 19   18  Temp: 98.6 F (37 C) (!) 100.9 F (38.3 C) 98.4 F (36.9 C) 98.6 F (37 C)  TempSrc: Oral Oral Oral Oral  SpO2: 96% 97% 94% 90%  Weight:   158 lb 1.6 oz (71.7 kg)   Height:       Weight change: 5 lb 12.8 oz (2.631 kg)  Intake/Output Summary (Last 24 hours) at 03/17/2018 1131 Last data filed at 03/17/2018 1028 Gross per 24 hour  Intake 960 ml  Output 1175 ml  Net -215 ml     Exam: Heart:: Regular rate and rhythm, S1S2 present or without murmur or extra heart sounds Lungs: normal and clear to auscultation and percussion Abdomen: soft, nontender, normal bowel sounds   Lab Results: @LABTEST2 @ Micro Results: No results found for this or any previous visit (from the past 240 hour(s)). Studies/Results: No results found. Medications: I have reviewed the patient's current medications. Scheduled Meds: . atorvastatin  80 mg Oral q1800  . cholecalciferol  1,000 Units Oral BH-q7a  . docusate sodium  100 mg Oral BID  . ezetimibe  10 mg Oral Daily  . folic acid  500 mcg Oral QPM  . furosemide  40 mg Oral Daily  . insulin aspart  0-5 Units Subcutaneous QHS  . insulin aspart  0-9 Units Subcutaneous TID WC  . losartan  100 mg Oral Daily  . metoprolol tartrate  50 mg Oral BID  . pantoprazole (PROTONIX) IV  40 mg Intravenous Q12H  . sodium chloride flush  3 mL Intravenous Q12H  .  vitamin C  1,000 mg Oral Daily   Continuous Infusions: . sodium chloride Stopped (03/14/18 0621)   PRN Meds:.acetaminophen **OR** acetaminophen, ALPRAZolam, bisacodyl, HYDROcodone-acetaminophen, ipratropium-albuterol, ondansetron **OR** ondansetron (ZOFRAN) IV, traMADol, traZODone   Assessment: Active Problems:   Severe anemia    Plan: This patient has had a history of black stools and eyes any anti-inflammatory medications.  The patient be set up for an EGD for tomorrow.  He had a colonoscopy approximately 7 years ago and reports it was normal. I have discussed risks & benefits which include, but are not limited to, bleeding, infection, perforation & drug reaction.  The patient agrees with this plan & written consent will be obtained.      LOS: 3 days   Midge Minium 03/17/2018, 11:31 AM

## 2018-03-18 ENCOUNTER — Inpatient Hospital Stay: Payer: Medicare Other | Admitting: Anesthesiology

## 2018-03-18 ENCOUNTER — Encounter
Admission: EM | Disposition: A | Discharge status: Home Health | Payer: Self-pay | Source: Home / Self Care | Attending: Internal Medicine

## 2018-03-18 ENCOUNTER — Telehealth: Payer: Self-pay | Admitting: Cardiovascular Disease

## 2018-03-18 ENCOUNTER — Encounter: Payer: Self-pay | Admitting: *Deleted

## 2018-03-18 DIAGNOSIS — K269 Duodenal ulcer, unspecified as acute or chronic, without hemorrhage or perforation: Secondary | ICD-10-CM

## 2018-03-18 DIAGNOSIS — K921 Melena: Secondary | ICD-10-CM

## 2018-03-18 HISTORY — PX: ESOPHAGOGASTRODUODENOSCOPY (EGD) WITH PROPOFOL: SHX5813

## 2018-03-18 LAB — HEMOGLOBIN: Hemoglobin: 10.4 g/dL — ABNORMAL LOW (ref 13.0–18.0)

## 2018-03-18 LAB — GLUCOSE, CAPILLARY
Glucose-Capillary: 179 mg/dL — ABNORMAL HIGH (ref 65–99)
Glucose-Capillary: 138 mg/dL — ABNORMAL HIGH (ref 65–99)

## 2018-03-18 SURGERY — ESOPHAGOGASTRODUODENOSCOPY (EGD) WITH PROPOFOL
Anesthesia: General

## 2018-03-18 MED ORDER — PROPOFOL 500 MG/50ML IV EMUL
INTRAVENOUS | Status: AC
  Filled 2018-03-18: qty 50

## 2018-03-18 MED ORDER — LACTATED RINGERS IV SOLN
INTRAVENOUS | Status: DC | PRN
  Administered 2018-03-18: 13:00:00 via INTRAVENOUS

## 2018-03-18 MED ORDER — LIDOCAINE HCL (PF) 2 % IJ SOLN
INTRAMUSCULAR | Status: AC
  Filled 2018-03-18: qty 10

## 2018-03-18 MED ORDER — PANTOPRAZOLE SODIUM 40 MG PO TBEC: 40.0000 mg | DELAYED_RELEASE_TABLET | Freq: Every day | ORAL | 0 refills | Status: DC

## 2018-03-18 MED ORDER — LIDOCAINE HCL (CARDIAC) 20 MG/ML IV SOLN
INTRAVENOUS | Status: DC | PRN
  Administered 2018-03-18: 80 mg via INTRAVENOUS

## 2018-03-18 MED ORDER — PROPOFOL 500 MG/50ML IV EMUL
INTRAVENOUS | Status: DC | PRN
  Administered 2018-03-18: 120 ug/kg/min via INTRAVENOUS

## 2018-03-18 MED ORDER — PROPOFOL 10 MG/ML IV BOLUS
INTRAVENOUS | Status: DC | PRN
  Administered 2018-03-18: 20 mg via INTRAVENOUS
  Administered 2018-03-18: 30 mg via INTRAVENOUS
  Administered 2018-03-18: 40 mg via INTRAVENOUS

## 2018-03-18 NOTE — Anesthesia Preprocedure Evaluation (Signed)
Anesthesia Evaluation  Patient identified by MRN, date of birth, ID band Patient awake    Reviewed: Allergy & Precautions, NPO status , Patient's Chart, lab work & pertinent test results, reviewed documented beta blocker date and time   Airway Mallampati: II  TM Distance: >3 FB     Dental  (+) Chipped   Pulmonary COPD, former smoker,           Cardiovascular hypertension, Pt. on medications and Pt. on home beta blockers + CAD, + Past MI, + Cardiac Stents, + CABG and +CHF  + Valvular Problems/Murmurs      Neuro/Psych    GI/Hepatic   Endo/Other  diabetes, Type 2  Renal/GU      Musculoskeletal   Abdominal   Peds  Hematology  (+) anemia ,   Anesthesia Other Findings Severe anemia. Aortic valve replace. EF 40.  Reproductive/Obstetrics                             Anesthesia Physical Anesthesia Plan  ASA: III  Anesthesia Plan: General   Post-op Pain Management:    Induction: Intravenous  PONV Risk Score and Plan:   Airway Management Planned:   Additional Equipment:   Intra-op Plan:   Post-operative Plan:   Informed Consent: I have reviewed the patients History and Physical, chart, labs and discussed the procedure including the risks, benefits and alternatives for the proposed anesthesia with the patient or authorized representative who has indicated his/her understanding and acceptance.     Plan Discussed with: CRNA  Anesthesia Plan Comments:         Anesthesia Quick Evaluation

## 2018-03-18 NOTE — Anesthesia Procedure Notes (Cosign Needed)
Date/Time: 03/18/2018 1:18 PM Performed by: Alford Highland, MD Pre-anesthesia Checklist: Timeout performed, Patient being monitored, Suction available, Emergency Drugs available and Patient identified Patient Re-evaluated:Patient Re-evaluated prior to induction Oxygen Delivery Method: Nasal cannula Induction Type: IV induction

## 2018-03-18 NOTE — Care Management (Signed)
Provided patient with list of Clinics in the area so he can chose a PCP when he moves to the area permanently.  Notified [facility] of discharge and orders present to resume the home health services

## 2018-03-18 NOTE — Telephone Encounter (Signed)
TCM....  Patient is being discharged   They saw Dr Tenny Craw and Eula Listen   They are scheduled to see Chirs Brion Aliment on 03/27/18  They were seen for Coronary Heart Disease   They need to be seen within 1 week     Please call

## 2018-03-18 NOTE — Progress Notes (Signed)
Patient back from Endoscopy. Resting quietly with no complaints. Order to proceed with discharge per Dr. Rozann Lesches Abrell to be D/C'd Home per MD order. Patient given discharge teaching and paperwork regarding medications, diet, follow-up appointments and activity. Patient understanding verbalized. No questions or complaints at this time. Skin condition as charted. IV and telemetry removed prior to leaving.  No further needs by Care Management/Social Work.   An After Visit Summary was printed and given to the patient. Caregiver/family present during discharge teaching.     Clayborne Dana

## 2018-03-18 NOTE — Op Note (Addendum)
[facility] Gastroenterology Patient Name: Harold Parker Procedure Date: 03/18/2018 1:09 PM MRN: [id number] Account #: [id number] Date of Birth: [birth year] Admit Type: Inpatient Age: 72 Room: [facility] ENDO ROOM 4 Gender: Male Note Status: Finalized Procedure:            Upper GI endoscopy Indications:          Melena Providers:            Midge Minium MD, MD Referring MD:         No Local Md, MD (Referring MD) Medicines:            Propofol per Anesthesia Complications:        No immediate complications. Procedure:            Pre-Anesthesia Assessment:                       - Prior to the procedure, a History and Physical was                        performed, and patient medications and allergies were                        reviewed. The patient's tolerance of previous                        anesthesia was also reviewed. The risks and benefits of                        the procedure and the sedation options and risks were                        discussed with the patient. All questions were                        answered, and informed consent was obtained. Prior                        Anticoagulants: The patient has taken no previous                        anticoagulant or antiplatelet agents. ASA Grade                        Assessment: II - A patient with mild systemic disease.                        After reviewing the risks and benefits, the patient was                        deemed in satisfactory condition to undergo the                        procedure.                       After obtaining informed consent, the endoscope was                        passed under direct vision. Throughout the procedure,  the patient's blood pressure, pulse, and oxygen                        saturations were monitored continuously. The Endoscope                        was introduced through the mouth, and advanced to the                        second part of  duodenum. The upper GI endoscopy was                        accomplished without difficulty. The patient tolerated                        the procedure well. Findings:      A small hiatal hernia was present.      The entire examined stomach was normal. Biopsies were taken with a cold       forceps for Helicobacter pylori testing.      Two non-bleeding cratered duodenal ulcers with no stigmata of bleeding       were found in the first portion of the duodenum. Impression:           - Small hiatal hernia.                       - Normal stomach. Biopsied.                       - Multiple non-bleeding duodenal ulcers with no                        stigmata of bleeding. Recommendation:       - Return patient to hospital ward for ongoing care.                       - Advance diet as tolerated.                       - Use a proton pump inhibitor PO daily.                       - Await pathology results. Procedure Code(s):    --- Professional ---                       [phone], Esophagogastroduodenoscopy, flexible, transoral;                        with biopsy, single or multiple Diagnosis Code(s):    --- Professional ---                       K92.1, Melena (includes Hematochezia)                       K26.9, Duodenal ulcer, unspecified as acute or chronic,                        without hemorrhage or perforation CPT copyright 2017 American Medical Association. All rights reserved. The codes documented in this report are preliminary and upon coder review may  be revised to  meet current compliance requirements. Midge Minium MD, MD 03/18/2018 1:33:11 PM This report has been signed electronically. Number of Addenda: 0 Note Initiated On: 03/18/2018 1:09 PM      [facility]

## 2018-03-18 NOTE — Progress Notes (Signed)
Pt alert, verbal with no noted distress. Denies pain or discomfort. Report given to pt nurse Ladona Ridgel, RN. Pt to be sent back to unit.

## 2018-03-18 NOTE — Care Management Important Message (Signed)
Important Message  Patient Details  Name: Harold Parker MRN: [id number] Date of Birth: 12-08-1946   Medicare Important Message Given:  Yes  Signed IM notice given   Eber Hong, RN 03/18/2018, 8:58 AM

## 2018-03-18 NOTE — Telephone Encounter (Signed)
Patient discharged today. TCM- 4/3

## 2018-03-18 NOTE — Anesthesia Post-op Follow-up Note (Signed)
Anesthesia QCDR form completed.        

## 2018-03-18 NOTE — Transfer of Care (Cosign Needed)
Immediate Anesthesia Transfer of Care Note  Patient: Harold Parker  Procedure(s) Performed: ESOPHAGOGASTRODUODENOSCOPY (EGD) WITH PROPOFOL (N/A )  Patient Location: PACU  Anesthesia Type:General  Level of Consciousness: awake, alert  and oriented  Airway & Oxygen Therapy: Patient Spontanous Breathing  Post-op Assessment: Report given to RN and Post -op Vital signs reviewed and stable  Post vital signs: Reviewed and stable  Last Vitals:  Vitals Value Taken Time  BP 96/54 03/18/2018  1:37 PM  Temp 36.5 C 03/18/2018  1:36 PM  Pulse 81 03/18/2018  1:38 PM  Resp 20 03/18/2018  1:38 PM  SpO2 97 % 03/18/2018  1:38 PM  Vitals shown include unvalidated device data.  Last Pain:  Vitals:   03/18/18 1336  TempSrc: Tympanic  PainSc: Asleep      Patients Stated Pain Goal: 2 (03/14/18 2302)  Complications: No apparent anesthesia complications

## 2018-03-18 NOTE — Discharge Summary (Signed)
[employer] - [place] at [facility]   PATIENT NAME: Harold Parker    MR#:  [id number]  DATE OF BIRTH:  [birth year]  DATE OF ADMISSION:  03/13/2018 ADMITTING PHYSICIAN: Cammy Copa, MD  DATE OF DISCHARGE: 03/18/2018  3:55 PM  PRIMARY CARE PHYSICIAN: System, Pcp Not In    ADMISSION DIAGNOSIS:  Elevated troponin [R74.8] Symptomatic anemia [D64.9] Gastrointestinal hemorrhage, unspecified gastrointestinal hemorrhage type [K92.2]  DISCHARGE DIAGNOSIS:  Active Problems:   Severe anemia   Gastrointestinal hemorrhage   Melena   Duodenal ulceration   SECONDARY DIAGNOSIS:   Past Medical History:  Diagnosis Date  . Bilateral carotid bruits    a. 01/2018 U/S: < 50% bilat ICA stenosis.  Marland Kitchen CAD (coronary artery disease)    a. 1998 s/p MI and BMS [facility], [place]); b. 1999 redo PCI/rotablator in setting of what sounds like ISR;  c. Multiple stress tests over the years - last ~ 2017, reportedly nl; d. 01/2018 NSTEMI/Cath: LM 23m/d, LAD 50p, 40p/m, D1 60ost, OM1 95, RCA 100ost/p w/ L->R collats, EF 45%; e. s/p 3V CABG 02/19/18 (LIMA-LAD, VG-D1, VG-OM)  . Chronic lower back pain   . COPD (chronic obstructive pulmonary disease) (HCC)   . HTN (hypertension)   . Hypercholesteremia   . Ischemic cardiomyopathy    a. 01/2018 Echo: EF 40-45%, mid-apicalanteroseptal, ant, apical sev HK, mod apicalinferior HK. Gr2 DD. Mod AS, mild MR, mod dil LA, PASP .  . Moderate aortic stenosis    a. 01/2018 Echo: Mod AS, mean grad (S) , Valve area (VTI) 1.06 cm^2, (Vmax) 1.27 cm^2; b. 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 Myocardial infarction (HCC) ~ 1998/1999  . 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 Tobacco abuse   . Type II diabetes mellitus (HCC)     HOSPITAL COURSE:   1.  Acute blood loss anemia  secondary to upper GI bleed.  The patient had a hemoglobin of  4.7 on presentation.  The patient received 4 units of blood and an iron infusion.  His Plavix and aspirin have been held.  Endoscopy was done on the day of discharge that showed 2 duodenal ulcers that were nonbleeding.  I will continue to hold aspirin and Plavix at this time.  Can consider restarting aspirin in 2-week.  But will follow up with cardiology first. 2.  Elevated troponin likely demand ischemia in the setting of severe anemia.  Patient with recent CABG and valve replacement.  Unable to give blood thinners at this time. 3.  Essential hypertension on losartan and metoprolol. 4.  Type 2 diabetes mellitus.  Last hemoglobin A1c 7.0.  Can go back on his metformin and low-dose glipizide as outpatient. 5.  Hyperlipidemia unspecified on atorvastatin and Zetia 6.  Anxiety on Xanax 7.  Chronic respiratory failure.  Patient was able to come off the oxygen during this hospitalization.  Patient sent home without oxygen. 8.  Recent CABG.  Can be referred to cardiac rehab as outpatient   DISCHARGE CONDITIONS:   Satisfactory  CONSULTS OBTAINED:  Treatment Team:  Antonieta Iba, MD  DRUG ALLERGIES:  No Known Allergies  DISCHARGE MEDICATIONS:   Allergies as of 03/18/2018   No Known Allergies     Medication List    STOP taking these medications  aspirin 81 MG EC tablet   clopidogrel 75 MG tablet Commonly known as:  PLAVIX   oxyCODONE 5 MG immediate release tablet Commonly known as:  Oxy IR/ROXICODONE   Potassium Chloride ER 20 MEQ Tbcr     TAKE these medications   ALPRAZolam 0.5 MG tablet Commonly known as:  XANAX Take 0.5 mg by mouth at bedtime as needed for anxiety.   atorvastatin 80 MG tablet Commonly known as:  LIPITOR Take 1 tablet (80 mg total) by mouth daily at 6 PM.   ezetimibe 10 MG tablet Commonly known as:  ZETIA Take 10 mg by mouth daily.   folic acid 400 MCG tablet Commonly known as:   FOLVITE Take 400 mcg by mouth every evening.   furosemide 40 MG tablet Commonly known as:  LASIX Please take one 40mg  Tab BID (twice a day) for 3 days then take one 40mg  tab once a day until we see you in follow-up. What changed:    how much to take  how to take this  when to take this  additional instructions   glipiZIDE 5 MG tablet Commonly known as:  GLUCOTROL Take 0.5 tablets (2.5 mg total) by mouth daily before breakfast.   ipratropium-albuterol 0.5-2.5 (3) MG/3ML Soln Commonly known as:  DUONEB Take 3 mLs by nebulization every 6 (six) hours as needed.   losartan 100 MG tablet Commonly known as:  COZAAR Take 1 tablet (100 mg total) by mouth daily.   metFORMIN 1000 MG tablet Commonly known as:  GLUCOPHAGE Take 1 tablet (1,000 mg total) by mouth 2 (two) times daily with a meal.   metoprolol tartrate 50 MG tablet Commonly known as:  LOPRESSOR Take 1 tablet (50 mg total) by mouth 2 (two) times daily.   pantoprazole 40 MG tablet Commonly known as:  PROTONIX Take 1 tablet (40 mg total) by mouth daily.   traMADol 50 MG tablet Commonly known as:  ULTRAM Take 1 tablet (50 mg total) by mouth every 6 (six) hours as needed.   vitamin C 1000 MG tablet Take 1,000 mg by mouth daily.   Vitamin D (Cholecalciferol) 1000 units Tabs Take 1 tablet by mouth every morning.        DISCHARGE INSTRUCTIONS:    Follow-up with Dr. Kirke Corin cardiology as outpatient. Follow-up cardiovascular surgeon as scheduled. Follow-up cardiac rehab. Fianc to schedule appointment with primary care physician.  If you experience worsening of your admission symptoms, develop shortness of breath, life threatening emergency, suicidal or homicidal thoughts you must seek medical attention immediately by calling 911 or calling your MD immediately  if symptoms less severe.  You Must read complete instructions/literature along with all the possible adverse reactions/side effects for all the Medicines  you take and that have been prescribed to you. Take any new Medicines after you have completely understood and accept all the possible adverse reactions/side effects.   Please note  You were cared for by a hospitalist during your hospital stay. If you have any questions about your discharge medications or the care you received while you were in the hospital after you are discharged, you can call the unit and asked to speak with the hospitalist on call if the hospitalist that took care of you is not available. Once you are discharged, your primary care physician will handle any further medical issues. Please note that NO REFILLS for any discharge medications will be authorized once you are discharged, as it is imperative that you return to your primary care physician (  or establish a relationship with a primary care physician if you do not have one) for your aftercare needs so that they can reassess your need for medications and monitor your lab values.    Today   CHIEF COMPLAINT:   Chief Complaint  Patient presents with  . Anxiety    HISTORY OF PRESENT ILLNESS:  Harold Parker  is a 72 y.o. male with a known history of CAD presented with shortness of breath.  Found to have anemia.   VITAL SIGNS:  Blood pressure 142/69, pulse 90, pulse ox 95% on room air, respirations 16 temperature 97.7    PHYSICAL EXAMINATION:  GENERAL:  72 y.o.-year-old patient lying in the bed with no acute distress.  EYES: Pupils equal, round, reactive to light and accommodation. No scleral icterus. Extraocular muscles intact.  HEENT: Head atraumatic, normocephalic. Oropharynx and nasopharynx clear.  NECK:  Supple, no jugular venous distention. No thyroid enlargement, no tenderness.  LUNGS:  decreased breath sounds bilaterally, no wheezing, rales,rhonchi or crepitation. No use of accessory muscles of respiration.  CARDIOVASCULAR: S1, S2 normal. No murmurs, rubs, or gallops.  ABDOMEN: Soft, non-tender,  non-distended. Bowel sounds present. No organomegaly or mass.  EXTREMITIES: No pedal edema, cyanosis, or clubbing.  NEUROLOGIC: Cranial nerves II through XII are intact. Muscle strength 5/5 in all extremities. Sensation intact. Gait not checked.  PSYCHIATRIC: The patient is alert and oriented x 3.  SKIN: No obvious rash, lesion, or ulcer.   DATA REVIEW:   CBC Recent Labs  Lab 03/15/18 0617  03/18/18 0447  WBC 12.3*  --   --   HGB 7.8*   < > 10.4*  HCT 23.4*  --   --   PLT 344  --   --    < > = values in this interval not displayed.    Chemistries  Recent Labs  Lab 03/15/18 0617  NA 135  K 4.6  CL 106  CO2 23  GLUCOSE 179*  BUN 37*  CREATININE 0.93  CALCIUM 8.3*    Cardiac Enzymes Recent Labs  Lab 03/14/18 0754  TROPONINI 8.10*    Management plans discussed with the patient, and he is in agreement.  CODE STATUS:     Code Status Orders  (From admission, onward)        Start     Ordered   03/14/18 0136  Full code  Continuous     03/14/18 0135    Code Status History    Date Active Date Inactive Code Status Order ID Comments User Context   02/07/2018 1636 02/26/2018 2129 Full Code [id number]  Creig Hines, NP Inpatient   02/05/2018 2047 02/07/2018 1608 Full Code [id number]  Shaune Pollack, MD Inpatient    Advance Directive Documentation     Most Recent Value  Type of Advance Directive  Healthcare Power of Attorney, Living will  Pre-existing out of facility DNR order (yellow form or pink MOST form)  -  "MOST" Form in Place?  -      TOTAL TIME TAKING CARE OF THIS PATIENT: 35 minutes.    Alford Highland M.D on 03/18/2018 at 4:20 PM  Between 7am to 6pm - Pager - [phone]  After 6pm go to [email or web address] - password [email or web address] [facility]  [employer] Office  [phone]  CC: Primary care physician; System, Pcp Not In

## 2018-03-18 NOTE — Progress Notes (Signed)
This patient had a EGD with ulcers found today and no sign of active bleeding.  Biopsies were taken for H. pylori.  Nothing to do for this patient from a GI point of view at this point except for a daily PPI.  I will sign off.  Please call if any further GI concerns or questions.  We would like to thank you for the opportunity to participate in the care of Harold Parker.

## 2018-03-18 NOTE — Anesthesia Postprocedure Evaluation (Signed)
Anesthesia Post Note  Patient: Harold Parker  Procedure(s) Performed: ESOPHAGOGASTRODUODENOSCOPY (EGD) WITH PROPOFOL (N/A )  Patient location during evaluation: Endoscopy Anesthesia Type: General Level of consciousness: awake and alert Pain management: pain level controlled Vital Signs Assessment: post-procedure vital signs reviewed and stable Respiratory status: spontaneous breathing, nonlabored ventilation, respiratory function stable and patient connected to nasal cannula oxygen Cardiovascular status: blood pressure returned to baseline and stable Postop Assessment: no apparent nausea or vomiting Anesthetic complications: no     Last Vitals:  Vitals:   03/18/18 1356 03/18/18 1406  BP: (!) 137/53 (!) 132/58  Pulse:    Resp:    Temp:    SpO2:      Last Pain:  Vitals:   03/18/18 1356  TempSrc:   PainSc: 0-No pain                 Cornella Emmer S

## 2018-03-18 NOTE — Plan of Care (Signed)
RN assessment and VS revealed stability for EGD. IV x2 patent. Has been NPO since midnight.  Only beta blocker and insulin given this AM. Removed dentures, jewelry and personal items (at bedside). Consents x2 signed and on chart.  CHG x2 completed and report called to Salley Scarlet, RN.

## 2018-03-19 DIAGNOSIS — I214 Non-ST elevation (NSTEMI) myocardial infarction: Secondary | ICD-10-CM | POA: Diagnosis not present

## 2018-03-19 DIAGNOSIS — Z48812 Encounter for surgical aftercare following surgery on the circulatory system: Secondary | ICD-10-CM | POA: Diagnosis not present

## 2018-03-19 NOTE — Telephone Encounter (Signed)
Patient contacted regarding discharge from [facility] on 03/18/18.   Patient understands to follow up with provider ? On 03/27/18 at 0830 AM at [facility].  Patient understands discharge instructions? Yes   Patient understands medications and regiment? Yes  Patient understands to bring all medications to this visit? Yes

## 2018-03-20 LAB — SURGICAL PATHOLOGY

## 2018-03-21 ENCOUNTER — Encounter (INDEPENDENT_AMBULATORY_CARE_PROVIDER_SITE_OTHER): Payer: Self-pay

## 2018-03-21 ENCOUNTER — Encounter: Payer: Self-pay | Admitting: Gastroenterology

## 2018-03-21 ENCOUNTER — Telehealth: Payer: Self-pay | Admitting: Cardiovascular Disease

## 2018-03-21 DIAGNOSIS — I214 Non-ST elevation (NSTEMI) myocardial infarction: Secondary | ICD-10-CM | POA: Diagnosis not present

## 2018-03-21 DIAGNOSIS — Z48812 Encounter for surgical aftercare following surgery on the circulatory system: Secondary | ICD-10-CM | POA: Diagnosis not present

## 2018-03-21 DIAGNOSIS — Z4802 Encounter for removal of sutures: Secondary | ICD-10-CM

## 2018-03-21 NOTE — Telephone Encounter (Signed)
Patient returned Medicare forms signed by ryan for a correction please call when ready for pick up   Placed in nurse box

## 2018-03-21 NOTE — Telephone Encounter (Signed)
Medicare forms placed on Harold Parker's desk for completion next time he is in the office.

## 2018-03-24 ENCOUNTER — Ambulatory Visit: Payer: Medicare Other | Admitting: Thoracic Surgery (Cardiothoracic Vascular Surgery)

## 2018-03-24 DIAGNOSIS — I214 Non-ST elevation (NSTEMI) myocardial infarction: Secondary | ICD-10-CM | POA: Diagnosis not present

## 2018-03-24 DIAGNOSIS — Z48812 Encounter for surgical aftercare following surgery on the circulatory system: Secondary | ICD-10-CM | POA: Diagnosis not present

## 2018-03-25 NOTE — Telephone Encounter (Signed)
I called and spoke with the patient and advised him that his form had been corrected.   He will pick this up when he comes for his office visit on 03/27/18 with Ward Givens, NP.

## 2018-03-26 DIAGNOSIS — I214 Non-ST elevation (NSTEMI) myocardial infarction: Secondary | ICD-10-CM | POA: Diagnosis not present

## 2018-03-26 DIAGNOSIS — Z48812 Encounter for surgical aftercare following surgery on the circulatory system: Secondary | ICD-10-CM | POA: Diagnosis not present

## 2018-03-27 ENCOUNTER — Ambulatory Visit (INDEPENDENT_AMBULATORY_CARE_PROVIDER_SITE_OTHER): Payer: Medicare Other | Admitting: Nurse Practitioner

## 2018-03-27 ENCOUNTER — Encounter: Payer: Self-pay | Admitting: Nurse Practitioner

## 2018-03-27 VITALS — BP 134/60 | HR 76 | Ht 66.0 in | Wt 160.0 lb

## 2018-03-27 DIAGNOSIS — I255 Ischemic cardiomyopathy: Secondary | ICD-10-CM | POA: Diagnosis not present

## 2018-03-27 DIAGNOSIS — I5022 Chronic systolic (congestive) heart failure: Secondary | ICD-10-CM | POA: Diagnosis not present

## 2018-03-27 DIAGNOSIS — I10 Essential (primary) hypertension: Secondary | ICD-10-CM

## 2018-03-27 DIAGNOSIS — K264 Chronic or unspecified duodenal ulcer with hemorrhage: Secondary | ICD-10-CM

## 2018-03-27 DIAGNOSIS — I2510 Atherosclerotic heart disease of native coronary artery without angina pectoris: Secondary | ICD-10-CM | POA: Diagnosis not present

## 2018-03-27 DIAGNOSIS — E782 Mixed hyperlipidemia: Secondary | ICD-10-CM | POA: Diagnosis not present

## 2018-03-27 MED ORDER — FUROSEMIDE 40 MG PO TABS: ORAL_TABLET | ORAL | 3 refills | Status: DC

## 2018-03-27 NOTE — Progress Notes (Addendum)
Office Visit    Patient Name: Harold Parker Date of Encounter: 03/27/2018  Primary Care Provider:  System, Pcp Not In Primary Cardiologist:  [name], MD  Chief Complaint    72 year old male with a history of CAD status post recent CABG, aortic stenosis status post bioprosthetic AVR, hypertension, hyperlipidemia, type 2 diabetes mellitus, tobacco abuse, COPD, and recent upper GI bleed, who presents for follow-up.  Past Medical History    Past Medical History:  Diagnosis Date  . Bilateral carotid bruits    a. 01/2018 U/S: < 50% bilat ICA stenosis.  Marland Kitchen CAD (coronary artery disease)    a. 1998 s/p MI and BMS [facility], [place]); b. 1999 redo PCI/rotablator in setting of what sounds like ISR;  c. Multiple stress tests over the years - last ~ 2017, reportedly nl; d. 01/2018 NSTEMI/Cath: LM 65m/d, LAD 50p, 40p/m, D1 60ost, OM1 95, RCA 100ost/p w/ L->R collats, EF 45%; e. s/p 3V CABG 02/19/18 (LIMA-LAD, VG-D1, VG-OM)  . Chronic lower back pain   . COPD (chronic obstructive pulmonary disease) (HCC)   . GIB (gastrointestinal bleeding)    a. 02/2018 GIB and anemia w/ Hgb of 4.7 on presentation; b. 03/2018 EGD: 2 nonbleeding duodenal ulcers.  Marland Kitchen HTN (hypertension)   . Hypercholesteremia   . Ischemic cardiomyopathy    a. 01/2018 Echo: EF 40-45%, mid-apicalanteroseptal, ant, apical sev HK, mod apicalinferior HK. Gr2 DD. Mod AS, mild MR, mod dil LA, PASP .  . Moderate aortic stenosis    a. 01/2018 Echo: Mod AS, mean grad (S) , Valve area (VTI) 1.06 cm^2, (Vmax) 1.27 cm^2; b. 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  . Myocardial infarction (HCC) ~ 1998/1999  . 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 02/19/2018 AVR: 25 mm Edwards Inspiris Resilia stented bovine pericardial tissue valve  . 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 Tobacco abuse   . Type II diabetes mellitus (HCC)    Past Surgical History:  Procedure  Laterality Date  . AORTIC VALVE REPLACEMENT N/A 02/19/2018   Procedure: AORTIC VALVE REPLACEMENT (AVR);  Surgeon: Purcell Nails, MD;  Location: [facility] OR;  Service: Open Heart Surgery;  Laterality: N/A;  . COLONOSCOPY    . CORONARY ANGIOPLASTY WITH STENT PLACEMENT  ~ 1998/1999  . CORONARY ARTERY BYPASS GRAFT N/A 02/19/2018   Procedure: CORONARY ARTERY BYPASS GRAFTING (CABG) x three , using left internal mammary artery and right leg greater saphenous vein harvested endoscopically;  Surgeon: Purcell Nails, MD;  Location: [facility] OR;  Service: Open Heart Surgery;  Laterality: N/A;  . ESOPHAGOGASTRODUODENOSCOPY (EGD) WITH PROPOFOL N/A 03/18/2018   Procedure: ESOPHAGOGASTRODUODENOSCOPY (EGD) WITH PROPOFOL;  Surgeon: Midge Minium, MD;  Location: [facility] ENDOSCOPY;  Service: Endoscopy;  Laterality: N/A;  . LEFT HEART CATH AND CORONARY ANGIOGRAPHY N/A 02/06/2018   Procedure: LEFT HEART CATH AND CORONARY ANGIOGRAPHY;  Surgeon: Iran Ouch, MD;  Location: [facility] INVASIVE CV LAB;  Service: Cardiovascular;  Laterality: N/A;  . TEE WITHOUT CARDIOVERSION N/A 02/19/2018   Procedure: TRANSESOPHAGEAL ECHOCARDIOGRAM (TEE);  Surgeon: Purcell Nails, MD;  Location: [facility] OR;  Service: Open Heart Surgery;  Laterality: N/A;  . TONSILLECTOMY      Allergies  No Known Allergies  History of Present Illness    72 year old male with a history of CAD status post MI in approximately 1998 with bare-metal stenting performed at that time.  Within a few  months, he had recurrent chest pain and required repeat intervention, possibly Rotablator based on his description.  Other history includes hypertension, hyperlipidemia, type 2 diabetes mellitus, tobacco abuse, and COPD.  He has a regular cardiologist in [place] and has also had multiple stress tests over the years.  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  In February of this year, he was admitted with chest pain and  dyspnea.  He ruled in for non-STEMI.  Catheterization revealed severe left main, OM1, and RCA disease.  Echocardiogram showed an EF of 40-45% with moderate aortic stenosis.  Patient was transferred to [facility] where he subsequently underwent coronary artery bypass grafting x3 and bioprosthetic aortic valve replacement.  Postoperative course notable for postop A. fib which was easily managed with amiodarone for.  He was discharged home February 26, 2018, on aspirin and Plavix.  He followed up in clinic on March 26, at which time he was doing well.  Unfortunately, within a day or 2, he started noticing presyncope, dyspnea, fatigue and subsequently melena and bright red blood.  He was admitted to [facility] regional on March 29 with a hemoglobin of 4.7.  Aspirin and Plavix were discontinued.  He did require transfusion.  EGD was subsequently performed and showed 2 nonbleeding duodenal ulcers.  He was placed on PPI therapy.  H&H were stable and he was subsequently discharged April 2 with recommendation to hold aspirin for 2 weeks.  Since discharge, he has done exceptionally well.  He is walking on a regular basis with his girlfriend.  He has not been having any chest pain, dyspnea, palpitations, PND, orthopnea, dizziness, syncope, edema, melena, or bright red blood per rectum.  Home Medications    Prior to Admission medications   Medication Sig Start Date End Date Taking? Authorizing Provider  ALPRAZolam Prudy Feeler) 0.5 MG tablet Take 0.5 mg by mouth at bedtime as needed for anxiety.   Yes [provider]  Ascorbic Acid (VITAMIN C) 1000 MG tablet Take 1,000 mg by mouth daily.   Yes [provider]  atorvastatin (LIPITOR) 80 MG tablet Take 1 tablet (80 mg total) by mouth daily at 6 PM. 02/26/18  Yes Asa Lente, Tessa N, PA-C  ezetimibe (ZETIA) 10 MG tablet Take 10 mg by mouth daily.   Yes [provider]  folic acid (FOLVITE) 400 MCG tablet Take 400 mcg by mouth every evening.   Yes [provider]  furosemide (LASIX) 40 MG tablet Please take one 40mg  Tab BID (twice a day) for 3 days then take one 40mg  tab once a day until we see you in follow-up. Patient taking differently: Take 40 mg by mouth daily. Please take one 40mg  Tab BID (twice a day) for 3 days then take one 40mg  tab once a day until we see you in follow-up. 02/26/18  Yes Asa Lente, Tessa N, PA-C  glipiZIDE (GLUCOTROL) 5 MG tablet Take 0.5 tablets (2.5 mg total) by mouth daily before breakfast. 02/27/18  Yes Conte, Tessa N, PA-C  ipratropium-albuterol (DUONEB) 0.5-2.5 (3) MG/3ML SOLN Take 3 mLs by nebulization every 6 (six) hours as needed. 02/03/18  Yes Emily Filbert, MD  losartan (COZAAR) 100 MG tablet Take 1 tablet (100 mg total) by mouth daily. 02/27/18  Yes Iran Ouch, MD  metFORMIN (GLUCOPHAGE) 1000 MG tablet Take 1 tablet (1,000 mg total) by mouth 2 (two) times daily with a meal. 02/26/18  Yes Sharlene Dory,  PA-C  metoprolol tartrate (LOPRESSOR) 50 MG tablet Take 1 tablet (50 mg total) by mouth 2 (two) times daily. 02/26/18  Yes Conte, Tessa N, PA-C  pantoprazole (PROTONIX) 40 MG tablet Take 1 tablet (40 mg total) by mouth daily. 03/18/18 03/18/19 Yes Wieting, Richard, MD  traMADol (ULTRAM) 50 MG tablet Take 1 tablet (50 mg total) by mouth every 6 (six) hours as needed. 03/12/18  Yes Purcell Nails, MD  Vitamin D, Cholecalciferol, 1000 units TABS Take 1 tablet by mouth every morning.    Yes [provider]    Review of Systems    Doing well since recent discharge.  He deni denies melena or bright red blood per rectum.  Es chest pain, palpitations, dyspnea, pnd, orthopnea, n, v, dizziness, syncope, edema, weight gain, or early satiety.  All other systems reviewed and are otherwise negative except as noted above.  Physical Exam    VS:  BP 134/60 (BP Location: Left Arm, Patient Position: Sitting, Cuff Size: Normal)   Pulse 76   Ht 5\' 6"  (1.676 m)   Wt 160 lb (72.6 kg)   BMI 25.82 kg/m  , BMI Body  mass index is 25.82 kg/m. GEN: Well nourished, well developed, in no acute distress.  HEENT: normal.  Neck: Supple, no JVD, carotid bruits, or masses. Cardiac: RRR, no murmurs, rubs, or gallops. No clubbing, cyanosis, edema.  Radials/DP/PT 2+ and equal bilaterally.  Respiratory:  Respirations regular and unlabored, clear to auscultation bilaterally. GI: Soft, nontender, nondistended, BS + x 4. MS: no deformity or atrophy. Skin: warm and dry, no rash. Neuro:  Strength and sensation are intact. Psych: Normal affect.  Accessory Clinical Findings    ECG -regular sinus rhythm, 76, PVC, anterolateral ST and T changes.  Similar to prior.  Assessment & Plan    1.  Coronary artery disease: Status post CABG x3 on March 6.  Recently admitted with upper GI bleed.  With the exception of anemia and GI bleed, he has been doing well from a cardiac standpoint.  Since hospital station, he has not had any chest pain or dyspnea and is walking regularly.  He is currently off of aspirin in the setting of duodenal ulcers noted on EGD April 2 with recommendation to hold aspirin for 2 weeks.  I will follow-up a CBC today and provided that H&H are stable, we can look to resume aspirin 81 mg sometime around April 16.  He otherwise remains on high potency statin therapy, Zetia, beta-blocker, and ARB.  He is interested in cardiac rehab and says he expects a call from them this afternoon.  He has follow-up with thoracic surgery on the 22nd.  2.  Essential hypertension: Stable on beta-blocker and ARB therapy.  3.  Hyperlipidemia: LDL was 74 in February.  He is now on high potency statin therapy and Zetia.  We will follow-up lipids and LFTs today as he is fasting.  4.  Ischemic cardiomyopathy/HFrEF: EF 40-45% at the time of his non-STEMI.  Euvolemic on exam.  He remains on beta-blocker and ARB therapy.  He is also currently on Lasix 40 mg daily.   5.  Type 2 diabetes mellitus: A1c 7.0 in February.  He has primary care in  [place] and just recently got an appointment to see primary care in [place] in a few weeks.  He is currently on metformin.  Given cardiac history, would recommend consideration for initiation of jardiance.  6.  Aortic stenosis:  S/p bioprosthetic AVR.  Will  arrange for f/u echo to establish new baseline.  7.  COPD/tobacco abuse: He has not smoked at all in 2 months.  I congratulated him on this.  He says in the setting of not smoking, his cough has all but completely resolved.  8.  Recent GI bleed/anemia: Follow-up CBC today.  He has been doing well without any melena or bright red blood.  9.  Disposition: Follow-up labs today.  He has follow-up with CT surgery later this month.  Follow-up with Dr. Kirke Corin in approximately 3 months or sooner if necessary.  He does plan on enrolling in cardiac rehab.  Nicolasa Ducking, NP 03/27/2018, 8:55 AM

## 2018-03-27 NOTE — Telephone Encounter (Signed)
The patient's completed forms were returned to him today at his office visit with Ward Givens, NP.

## 2018-03-27 NOTE — Patient Instructions (Signed)
Medication Instructions: - Your physician recommends that you continue on your current medications as directed. Please refer to the Current Medication list given to you today.  Labwork: - Your physician recommends that you have lab work today: lipid/ cmet/ cbc  Procedures/Testing: - none ordered  Follow-Up: - Your physician recommends that you schedule a follow-up appointment in: 3 months with Dr. Kirke Corin.   Any Additional Special Instructions Will Be Listed Below (If Applicable).     If you need a refill on your cardiac medications before your next appointment, please call your pharmacy.

## 2018-03-28 LAB — COMPREHENSIVE METABOLIC PANEL
ALT: 15 IU/L (ref 0–44)
AST: 21 IU/L (ref 0–40)
Albumin/Globulin Ratio: 1.3 (ref 1.2–2.2)
Albumin: 3.8 g/dL (ref 3.5–4.8)
Alkaline Phosphatase: 73 IU/L (ref 39–117)
BUN/Creatinine Ratio: 22 (ref 10–24)
BUN: 17 mg/dL (ref 8–27)
Bilirubin Total: 0.5 mg/dL (ref 0.0–1.2)
CO2: 25 mmol/L (ref 20–29)
Calcium: 9.7 mg/dL (ref 8.6–10.2)
Chloride: 103 mmol/L (ref 96–106)
Creatinine, Ser: 0.79 mg/dL (ref 0.76–1.27)
GFR calc Af Amer: 103 mL/min/{1.73_m2} (ref >59)
GFR calc non Af Amer: 89 mL/min/{1.73_m2} (ref >59)
Globulin, Total: 2.9 g/dL (ref 1.5–4.5)
Glucose: 73 mg/dL (ref 65–99)
Potassium: 5.2 mmol/L (ref 3.5–5.2)
Sodium: 142 mmol/L (ref 134–144)
Total Protein: 6.7 g/dL (ref 6.0–8.5)

## 2018-03-28 LAB — CBC WITH DIFFERENTIAL/PLATELET
Basophils Absolute: 0.1 10*3/uL (ref 0.0–0.2)
Basos: 1 %
EOS (ABSOLUTE): 0.6 10*3/uL — ABNORMAL HIGH (ref 0.0–0.4)
Eos: 6 %
Hematocrit: 37.7 % (ref 37.5–51.0)
Hemoglobin: 12.1 g/dL — ABNORMAL LOW (ref 13.0–17.7)
Immature Grans (Abs): 0 10*3/uL (ref 0.0–0.1)
Immature Granulocytes: 0 %
Lymphocytes Absolute: 3 10*3/uL (ref 0.7–3.1)
Lymphs: 31 %
MCH: 28.7 pg (ref 26.6–33.0)
MCHC: 32.1 g/dL (ref 31.5–35.7)
MCV: 89 fL (ref 79–97)
Monocytes Absolute: 1.1 10*3/uL — ABNORMAL HIGH (ref 0.1–0.9)
Monocytes: 11 %
Neutrophils Absolute: 5 10*3/uL (ref 1.4–7.0)
Neutrophils: 51 %
Platelets: 275 10*3/uL (ref 150–379)
RBC: 4.22 x10E6/uL (ref 4.14–5.80)
RDW: 15.7 % — ABNORMAL HIGH (ref 12.3–15.4)
WBC: 9.9 10*3/uL (ref 3.4–10.8)

## 2018-03-28 LAB — LIPID PANEL
Chol/HDL Ratio: 2.9 ratio (ref 0.0–5.0)
Cholesterol, Total: 102 mg/dL (ref 100–199)
HDL: 35 mg/dL — ABNORMAL LOW (ref >39)
LDL Calculated: 50 mg/dL (ref 0–99)
Triglycerides: 84 mg/dL (ref 0–149)
VLDL Cholesterol Cal: 17 mg/dL (ref 5–40)

## 2018-03-31 ENCOUNTER — Telehealth: Payer: Self-pay | Admitting: Nurse Practitioner

## 2018-03-31 DIAGNOSIS — I359 Nonrheumatic aortic valve disorder, unspecified: Secondary | ICD-10-CM

## 2018-03-31 NOTE — Telephone Encounter (Signed)
I left a detailed message on the patient's voice mail (ok per DPR), per Ward Givens, NP, we needed to order a follow up echo to re-look at his aortic valve.  I advised scheduling would be in touch with him to arrange.

## 2018-04-02 ENCOUNTER — Other Ambulatory Visit: Payer: Self-pay | Admitting: Thoracic Surgery (Cardiothoracic Vascular Surgery)

## 2018-04-02 DIAGNOSIS — Z48812 Encounter for surgical aftercare following surgery on the circulatory system: Secondary | ICD-10-CM | POA: Diagnosis not present

## 2018-04-02 DIAGNOSIS — I2510 Atherosclerotic heart disease of native coronary artery without angina pectoris: Secondary | ICD-10-CM

## 2018-04-02 DIAGNOSIS — I214 Non-ST elevation (NSTEMI) myocardial infarction: Secondary | ICD-10-CM | POA: Diagnosis not present

## 2018-04-04 ENCOUNTER — Other Ambulatory Visit: Payer: Self-pay | Admitting: Physician Assistant

## 2018-04-07 ENCOUNTER — Ambulatory Visit (INDEPENDENT_AMBULATORY_CARE_PROVIDER_SITE_OTHER): Payer: Self-pay | Admitting: Physician Assistant

## 2018-04-07 ENCOUNTER — Ambulatory Visit
Admission: RE | Admit: 2018-04-07 | Discharge: 2018-04-07 | Disposition: A | Discharge status: Home / Self Care | Payer: Medicare Other | Source: Ambulatory Visit | Attending: Thoracic Surgery (Cardiothoracic Vascular Surgery) | Admitting: Thoracic Surgery (Cardiothoracic Vascular Surgery)

## 2018-04-07 ENCOUNTER — Other Ambulatory Visit: Payer: Self-pay

## 2018-04-07 VITALS — BP 146/72 | HR 86 | Resp 18 | Ht 66.0 in | Wt 159.0 lb

## 2018-04-07 DIAGNOSIS — Z953 Presence of xenogenic heart valve: Secondary | ICD-10-CM

## 2018-04-07 DIAGNOSIS — I2510 Atherosclerotic heart disease of native coronary artery without angina pectoris: Secondary | ICD-10-CM

## 2018-04-07 DIAGNOSIS — R0789 Other chest pain: Secondary | ICD-10-CM | POA: Diagnosis not present

## 2018-04-07 NOTE — Patient Instructions (Addendum)
You may return to driving an automobile as long as you are no longer requiring oral narcotic pain relievers during the daytime.  It would be wise to start driving only short distances during the daylight and gradually increase from there as you feel comfortable.  Make every effort to keep your diabetes under very tight control.  Follow up closely with your primary care physician or endocrinologist and strive to keep their hemoglobin A1c levels as low as possible, preferably near or below 6.0.  The long term benefits of strict control of diabetes are far reaching and critically important for your overall health and survival. Endocarditis is a potentially serious infection of heart valves or inside lining of the heart.  It occurs more commonly in patients with diseased heart valves (such as patient's with aortic or mitral valve disease) and in patients who have undergone heart valve repair or replacement.  Certain surgical and dental procedures may put you at risk, such as dental cleaning, other dental procedures, or any surgery involving the respiratory, urinary, gastrointestinal tract, gallbladder or prostate gland.   To minimize your chances for develooping endocarditis, maintain good oral health and seek prompt medical attention for any infections involving the mouth, teeth, gums, skin or urinary tract.    Always notify your doctor or dentist about your underlying heart valve condition before having any invasive procedures. You will need to take antibiotics before certain procedures, including all routine dental cleanings or other dental procedures.  Your cardiologist or dentist should prescribe these antibiotics for you to be taken ahead of time.    Make every effort to stay physically active, get some type of exercise on a regular basis, and stick to a "heart healthy diet".  The long term benefits for regular exercise and a healthy diet are critically important to your overall health and  wellbeing.

## 2018-04-07 NOTE — Addendum Note (Signed)
Addended by: Tommye Standard F on: 04/07/2018 04:07 PM   Modules accepted: Orders

## 2018-04-07 NOTE — Progress Notes (Signed)
[address]       [place] [address]             [phone]      Harold Parker is a 72 y.o. male patient status post aortic valve replacement and coronary bypass grafting x3 with Dr. Cornelius Moras.  He did have to come back to the hospital for some anemia and a diagnosed upper GI bleed.  He received 4 units of packed red blood cells and an iron infusion.  He does have follow-up arranged for this outpatient.  They stopped his aspirin and Plavix at this time and it has not been restarted.   1. S/P aortic valve replacement with bioprosthetic valve + CABG x3    Past Medical History:  Diagnosis Date  . Bilateral carotid bruits    a. 01/2018 U/S: < 50% bilat ICA stenosis.  Marland Kitchen CAD (coronary artery disease)    a. 1998 s/p MI and BMS [facility] [place]); b. 1999 redo PCI/rotablator in setting of what sounds like ISR;  c. Multiple stress tests over the years - last ~ 2017, reportedly nl; d. 01/2018 NSTEMI/Cath: LM 60m/d, LAD 50p, 40p/m, D1 60ost, OM1 95, RCA 100ost/p w/ L->R collats, EF 45%; e. s/p 3V CABG 02/19/18 (LIMA-LAD, VG-D1, VG-OM)  . Chronic lower back pain   . COPD (chronic obstructive pulmonary disease) (HCC)   . GIB (gastrointestinal bleeding)    a. 02/2018 GIB and anemia w/ Hgb of 4.7 on presentation; b. 03/2018 EGD: 2 nonbleeding duodenal ulcers.  Marland Kitchen HTN (hypertension)   . Hypercholesteremia   . Ischemic cardiomyopathy    a. 01/2018 Echo: EF 40-45%, mid-apicalanteroseptal, ant, apical sev HK, mod apicalinferior HK. Gr2 DD. Mod AS, mild MR, mod dil LA, PASP .  . Moderate aortic stenosis    a. 01/2018 Echo: Mod AS, mean grad (S) , Valve area (VTI) 1.06 cm^2, (Vmax) 1.27 cm^2; b. 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  . Myocardial infarction (HCC) ~ 1998/1999  . 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 02/19/2018 AVR: 25 mm Edwards Inspiris Resilia stented bovine pericardial tissue valve  . 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 Tobacco abuse   . Type II diabetes mellitus (HCC)    No past surgical history pertinent negatives on file.    Scheduled Meds: Current Outpatient Medications on File Prior to Visit  Medication Sig Dispense Refill  . ALPRAZolam (XANAX) 0.5 MG tablet Take 0.5 mg by mouth at bedtime as needed for anxiety.    . Ascorbic Acid (VITAMIN C) 1000 MG tablet Take 1,000 mg by mouth daily.    Marland Kitchen atorvastatin (LIPITOR) 80 MG tablet Take 1 tablet (80 mg total) by mouth daily at 6 PM. 30 tablet 1  . ezetimibe (ZETIA) 10 MG tablet Take 10 mg by mouth daily.    . folic acid (FOLVITE) 400 MCG tablet Take 400 mcg by mouth every evening.    . furosemide (LASIX) 40 MG tablet Take 1 tablet (40 mg) by mouth once daily 90 tablet 3  . glipiZIDE (GLUCOTROL) 5 MG tablet Take 0.5 tablets (2.5 mg total) by mouth daily before breakfast. 30 tablet 1  . ipratropium-albuterol (DUONEB) 0.5-2.5 (3) MG/3ML SOLN Take 3 mLs by nebulization every 6 (six) hours as needed. 360 mL 1  . losartan (COZAAR) 100 MG tablet Take 1 tablet (100 mg total) by mouth daily.  90 tablet 3  . metFORMIN (GLUCOPHAGE) 1000 MG tablet Take 1 tablet (1,000 mg total) by mouth 2 (two) times daily with a meal. 60 tablet 1  . metoprolol tartrate (LOPRESSOR) 50 MG tablet Take 1 tablet (50 mg total) by mouth 2 (two) times daily. 60 tablet 1  . pantoprazole (PROTONIX) 40 MG tablet Take 1 tablet (40 mg total) by mouth daily. 30 tablet 0  . traMADol (ULTRAM) 50 MG tablet Take 1 tablet (50 mg total) by mouth every 6 (six) hours as needed. 40 tablet 0  . Vitamin D, Cholecalciferol, 1000 units TABS Take 1 tablet by mouth every morning.      No current facility-administered medications on file prior to visit.      Blood pressure (!) 146/72, pulse 86, resp. rate 18, height 5\' 6"  (1.676 m), weight 159 lb (72.1 kg), SpO2 93 %.  Subjective: Overall Harold Parker is feeling well.  He does occasionally get dizzy upon standing up however  he does not regularly drink water throughout the day.  I encouraged hydration since this may be some orthostatic hypotension.  He does have a physician following his hemoglobin and hematocrit.  His last hemoglobin was 12.1.   Objective: Cor: RRR, no murmur Pulm: CTA bilaterally in all fields Abd:No tenderness Ext: No edema Wound: Sternal incision is c/d/i without drainage. Right EVH site is c/d/i  Assessment & Plan   Harold Parker is doing very well overall. He occasionally has some achy pain in his chest but he is only taking Tramadol at night.  Since he is no longer requiring daytime narcotic pain medicine I have cleared him for driving at this time.  He asked about a wedding that he is to attend in 2 weeks in [place] and I did clear him for attending, however I reviewed some parameters.  I suggested stopping several times walking around and taking all of his inhaler and nebulizer treatments with him.  He agrees to abide.  He does get dizzy sometimes when standing up however, he mostly only drinks decaf coffee and not much water throughout the day.  I suggested increasing his water intake to 8 glasses a day and see if this improves his dizziness.  Also, of course he is to follow-up with his hemoglobin level since this may also cause dizziness.  His blood pressure was okay on exam today.  We have added an enteric-coated aspirin 81 mg to his medication regimen.  He also is on Protonix for his upper GI bleed.  I reviewed his chest x-ray with the patient and his wife.  Radiology suggested possible pneumonia on chest x-ray but upon review it seems to be likely atelectasis and possibly some tissue from where the left internal mammary artery was harvested.  Dr. Cornelius Moras also reviewed the films and talked to the patient.  I have suggested cardiac rehab and the patient actually is already set up at [facility].  They had called when he was in the hospital for his anemia and so I suggested reaching out again  to set up an evaluation.  He has an echocardiogram on Thursday with Dr. Kirke Corin 's office and plans to see him next month.  I suggested holding off on riding his motorcycle at this time.  He can discuss this with Dr. Cornelius Moras when he sees him in June.  He has any other questions or concerns he may call our office and we are always happy to help.  Dr. Freida Busman will see the  patient in 2 months for his 20-month follow-up visit.  Sharlene Dory 04/07/2018

## 2018-04-09 ENCOUNTER — Other Ambulatory Visit: Payer: Self-pay | Admitting: Thoracic Surgery (Cardiothoracic Vascular Surgery)

## 2018-04-09 DIAGNOSIS — I214 Non-ST elevation (NSTEMI) myocardial infarction: Secondary | ICD-10-CM | POA: Diagnosis not present

## 2018-04-09 DIAGNOSIS — G8918 Other acute postprocedural pain: Secondary | ICD-10-CM

## 2018-04-09 DIAGNOSIS — Z48812 Encounter for surgical aftercare following surgery on the circulatory system: Secondary | ICD-10-CM | POA: Diagnosis not present

## 2018-04-10 ENCOUNTER — Ambulatory Visit (INDEPENDENT_AMBULATORY_CARE_PROVIDER_SITE_OTHER): Payer: Medicare Other

## 2018-04-10 ENCOUNTER — Other Ambulatory Visit: Payer: Self-pay

## 2018-04-10 DIAGNOSIS — I359 Nonrheumatic aortic valve disorder, unspecified: Secondary | ICD-10-CM

## 2018-04-10 MED ORDER — PERFLUTREN LIPID MICROSPHERE: 1.0000 mL | INTRAVENOUS | Status: DC | PRN

## 2018-04-10 MED ORDER — PERFLUTREN LIPID MICROSPHERE: 2.0000 mL | INTRAVENOUS | Status: AC | PRN

## 2018-04-24 ENCOUNTER — Ambulatory Visit: Payer: Medicare Other

## 2018-04-25 ENCOUNTER — Ambulatory Visit: Payer: Medicare Other | Admitting: Cardiovascular Disease

## 2018-04-27 ENCOUNTER — Other Ambulatory Visit: Payer: Self-pay | Admitting: Thoracic Surgery (Cardiothoracic Vascular Surgery)

## 2018-04-27 ENCOUNTER — Other Ambulatory Visit: Payer: Self-pay | Admitting: Physician Assistant

## 2018-05-01 ENCOUNTER — Encounter: Payer: Self-pay | Admitting: *Deleted

## 2018-05-01 ENCOUNTER — Encounter: Payer: Medicare Other | Attending: Cardiovascular Disease | Admitting: *Deleted

## 2018-05-01 VITALS — Ht 65.0 in | Wt 163.8 lb

## 2018-05-01 DIAGNOSIS — Z952 Presence of prosthetic heart valve: Secondary | ICD-10-CM

## 2018-05-01 DIAGNOSIS — Z951 Presence of aortocoronary bypass graft: Secondary | ICD-10-CM | POA: Insufficient documentation

## 2018-05-01 DIAGNOSIS — Z48812 Encounter for surgical aftercare following surgery on the circulatory system: Secondary | ICD-10-CM | POA: Diagnosis not present

## 2018-05-01 NOTE — Progress Notes (Signed)
Cardiac Individual Treatment Plan  Patient Details  Name: Harold Parker MRN: [id number] Date of Birth: 1946-05-29 Referring Provider:     Cardiac Rehab from 05/01/2018 in [facility] Cardiac and Pulmonary Rehab  Referring Provider  Arida      Initial Encounter Date:    Cardiac Rehab from 05/01/2018 in [facility] Cardiac and Pulmonary Rehab  Date  05/01/18  Referring Provider  Fletcher Anon      Visit Diagnosis: S/P CABG x 3  S/P aortic valve replacement  Patient's Home Medications on Admission:  Current Outpatient Medications:  .  ALPRAZolam (XANAX) 0.5 MG tablet, Take 0.5 mg by mouth at bedtime as needed for anxiety., Disp: , Rfl:  .  Ascorbic Acid (VITAMIN C) 1000 MG tablet, Take 1,000 mg by mouth daily., Disp: , Rfl:  .  aspirin EC 81 MG tablet, Take 81 mg by mouth daily., Disp: , Rfl:  .  atorvastatin (LIPITOR) 80 MG tablet, TAKE 1 TABLET BY MOUTH DAILY AT 6 PM, Disp: 30 tablet, Rfl: 0 .  ezetimibe (ZETIA) 10 MG tablet, Take 10 mg by mouth daily., Disp: , Rfl:  .  folic acid (FOLVITE) 315 MCG tablet, Take 400 mcg by mouth every evening., Disp: , Rfl:  .  furosemide (LASIX) 40 MG tablet, Take 1 tablet (40 mg) by mouth once daily, Disp: 90 tablet, Rfl: 3 .  glipiZIDE (GLUCOTROL) 5 MG tablet, Take 0.5 tablets (2.5 mg total) by mouth daily before breakfast., Disp: 30 tablet, Rfl: 1 .  ipratropium-albuterol (DUONEB) 0.5-2.5 (3) MG/3ML SOLN, Take 3 mLs by nebulization every 6 (six) hours as needed., Disp: 360 mL, Rfl: 1 .  losartan (COZAAR) 100 MG tablet, Take 1 tablet (100 mg total) by mouth daily., Disp: 90 tablet, Rfl: 3 .  metFORMIN (GLUCOPHAGE) 1000 MG tablet, TAKE 1 TABLET BY MOUTH TWICE DAILY WITH A MEAL, Disp: 60 tablet, Rfl: 0 .  metoprolol tartrate (LOPRESSOR) 50 MG tablet, TAKE 1 TABLET BY MOUTH TWICE DAILY, Disp: 60 tablet, Rfl: 0 .  pantoprazole (PROTONIX) 40 MG tablet, Take 1 tablet (40 mg total) by mouth daily., Disp: 30 tablet, Rfl: 0 .  traMADol (ULTRAM) 50 MG tablet, TAKE 1 TABLET BY  MOUTH EVERY 6 HOURS AS NEEDED, Disp: 40 tablet, Rfl: 0 .  Vitamin D, Cholecalciferol, 1000 units TABS, Take 1 tablet by mouth every morning. , Disp: , Rfl:  .  clopidogrel (PLAVIX) 75 MG tablet, TAKE 1 TABLET BY MOUTH DAILY (Patient not taking: Reported on 05/01/2018), Disp: 30 tablet, Rfl: 0  Past Medical History: Past Medical History:  Diagnosis Date  . Bilateral carotid bruits    a. 01/2018 U/S: < 50% bilat ICA stenosis.  Marland Kitchen CAD (coronary artery disease)    a. 1998 s/p MI and BMS [facility], [place]); b. 1999 redo PCI/rotablator in setting of what sounds like ISR;  c. Multiple stress tests over the years - last ~ 2017, reportedly nl; d. 01/2018 NSTEMI/Cath: LM 75md, LAD 50p, 40p/m, D1 60ost, OM1 95, RCA 100ost/p w/ L->R collats, EF 45%; e. s/p 3V CABG 02/19/18 (LIMA-LAD, VG-D1, VG-OM)  . Chronic lower back pain   . COPD (chronic obstructive pulmonary disease) ([facility]   . GIB (gastrointestinal bleeding)    a. 02/2018 GIB and anemia w/ Hgb of 4.7 on presentation; b. 03/2018 EGD: 2 nonbleeding duodenal ulcers.  .Marland KitchenHTN (hypertension)   . Hypercholesteremia   . Ischemic cardiomyopathy    a. 01/2018 Echo: EF 40-45%, mid-apicalanteroseptal, ant, apical sev HK, mod apicalinferior HK. Gr2 DD. Mod AS, mild  MR, mod dil LA, PASP 41mHg.  . Moderate aortic stenosis    a. 01/2018 Echo: Mod AS, mean grad (S) 250mg, Valve area (VTI) 1.06 cm^2, (Vmax) 1.27 cm^2; b. 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  . [place] ([place] 1998[place]  . 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 02/19/2018 AVR: 25 mm Edwards Inspiris Resilia stented bovine pericardial tissue valve  . 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 Tobacco abuse   . Type II diabetes mellitus (HCC)     Tobacco Use: Social History   Tobacco Use  Smoking Status Former Smoker  . Packs/day: 1.00  . Years: 15.00  . Pack years: 15.00  . Types: Cigarettes  . Last attempt to  quit: 02/02/2018  . Years since quitting: 0.2  Smokeless Tobacco Never Used  Tobacco Comment   Quit 01/2018 - on 05/01/2018 talked to RaJarrielbout Relaspe concerns. He ststaes that he has no taste for tobacco since he quit in Feb.    Labs: Recent Review Flowsheet Data    Labs for ITP Cardiac and Pulmonary Rehab Latest Ref Rng & Units 02/19/2018 02/19/2018 02/20/2018 02/20/2018 03/27/2018   Cholestrol 100 - 199 mg/dL - - - - 102   LDLCALC 0 - 99 mg/dL - - - - 50   HDL >39 mg/dL - - - - 35(L)   Trlycerides 0 - 149 mg/dL - - - - 84   Hemoglobin A1c 4.8 - 5.6 % - - - - -   PHART 7.350 - 7.450 - 7.335(L) 7.361 7.331(L) -   PCO2ART 32.0 - 48.0 mmHg - 40.2 40.1 41.0 -   HCO3 20.0 - 28.0 mmol/L - 21.2 22.5 21.5 -   TCO2 22 - 32 mmol/L '24 22 24 23 '$ -   ACIDBASEDEF 0.0 - 2.0 mmol/L - 4.0(H) 2.0 4.0(H) -   O2SAT % - 97.0 97.0 95.0 -       Exercise Target Goals: Date: 05/01/18  Exercise Program Goal: Individual exercise prescription set using results from initial 6 min walk test and THRR while considering  patient's activity barriers and safety.   Exercise Prescription Goal: Initial exercise prescription builds to 30-45 minutes a day of aerobic activity, 2-3 days per week.  Home exercise guidelines will be given to patient during program as part of exercise prescription that the participant will acknowledge.  Activity Barriers & Risk Stratification: Activity Barriers & Cardiac Risk Stratification - 05/01/18 1450      Activity Barriers & Cardiac Risk Stratification   Activity Barriers  Back Problems HIstory of back injury years ago.  He has lidocaine patches and epidurals as needed       6 Minute Walk: 6 Minute Walk    Row Name 05/01/18 1406         6 Minute Walk   Distance  888 feet     Walk Time  6 minutes     # of Rest Breaks  0     MPH  1.68     METS  2.04     RPE  11     Perceived Dyspnea   0     VO2 Peak  7.17     Symptoms  No     Resting HR  78 bpm     Resting BP  134/64      Resting Oxygen Saturation   94 %  Exercise Oxygen Saturation  during 6 min walk  94 %     Max Ex. HR  110 bpm     Max Ex. BP  126/60     2 Minute Post BP  116/68        Oxygen Initial Assessment:   Oxygen Re-Evaluation:   Oxygen Discharge (Final Oxygen Re-Evaluation):   Initial Exercise Prescription: Initial Exercise Prescription - 05/01/18 1400      Date of Initial Exercise RX and Referring Provider   Date  05/01/18    Referring Provider  Arida      Treadmill   MPH  1.5    Grade  0    Minutes  15    METs  2.15      Recumbant Bike   Level  1    RPM  60    Watts  16    Minutes  15    METs  2      NuStep   Level  2    SPM  80    Minutes  15    METs  2      Prescription Details   Frequency (times per week)  2    Duration  Progress to 30 minutes of continuous aerobic without signs/symptoms of physical distress      Intensity   THRR 40-80% of Max Heartrate  105-133    Ratings of Perceived Exertion  11-13    Perceived Dyspnea  0-4      Resistance Training   Training Prescription  Yes    Weight  3 lb    Reps  10-15       Perform Capillary Blood Glucose checks as needed.  Exercise Prescription Changes: Exercise Prescription Changes    Row Name 05/01/18 1400             Response to Exercise   Blood Pressure (Admit)  134/64       Blood Pressure (Exercise)  126/60       Blood Pressure (Exit)  116/68       Heart Rate (Admit)  72 bpm       Heart Rate (Exercise)  93 bpm       Heart Rate (Exit)  63 bpm       Oxygen Saturation (Admit)  94 %       Rating of Perceived Exertion (Exercise)  11          Exercise Comments:   Exercise Goals and Review: Exercise Goals    Row Name 05/01/18 1405             Exercise Goals   Increase Physical Activity  Yes       Intervention  Provide advice, education, support and counseling about physical activity/exercise needs.;Develop an individualized exercise prescription for aerobic and resistive training  based on initial evaluation findings, risk stratification, comorbidities and participant's personal goals.       Expected Outcomes  Short Term: Attend rehab on a regular basis to increase amount of physical activity.;Long Term: Add in home exercise to make exercise part of routine and to increase amount of physical activity.;Long Term: Exercising regularly at least 3-5 days a week.       Increase Strength and Stamina  Yes       Intervention  Provide advice, education, support and counseling about physical activity/exercise needs.;Develop an individualized exercise prescription for aerobic and resistive training based on initial evaluation findings, risk stratification, comorbidities and participant's personal goals.  Expected Outcomes  Short Term: Increase workloads from initial exercise prescription for resistance, speed, and METs.;Short Term: Perform resistance training exercises routinely during rehab and add in resistance training at home;Long Term: Improve cardiorespiratory fitness, muscular endurance and strength as measured by increased METs and functional capacity (6MWT)       Able to understand and use rate of perceived exertion (RPE) scale  Yes       Intervention  Provide education and explanation on how to use RPE scale       Expected Outcomes  Short Term: Able to use RPE daily in rehab to express subjective intensity level;Long Term:  Able to use RPE to guide intensity level when exercising independently       Able to understand and use Dyspnea scale  Yes       Intervention  Provide education and explanation on how to use Dyspnea scale       Expected Outcomes  Short Term: Able to use Dyspnea scale daily in rehab to express subjective sense of shortness of breath during exertion;Long Term: Able to use Dyspnea scale to guide intensity level when exercising independently       Knowledge and understanding of Target Heart Rate Range (THRR)  Yes       Intervention  Provide education and  explanation of THRR including how the numbers were predicted and where they are located for reference       Expected Outcomes  Short Term: Able to state/look up THRR;Short Term: Able to use daily as guideline for intensity in rehab;Long Term: Able to use THRR to govern intensity when exercising independently       Able to check pulse independently  Yes       Intervention  Provide education and demonstration on how to check pulse in carotid and radial arteries.;Review the importance of being able to check your own pulse for safety during independent exercise       Expected Outcomes  Short Term: Able to explain why pulse checking is important during independent exercise;Long Term: Able to check pulse independently and accurately       Understanding of Exercise Prescription  Yes       Intervention  Provide education, explanation, and written materials on patient's individual exercise prescription       Expected Outcomes  Short Term: Able to explain program exercise prescription;Long Term: Able to explain home exercise prescription to exercise independently          Exercise Goals Re-Evaluation :   Discharge Exercise Prescription (Final Exercise Prescription Changes): Exercise Prescription Changes - 05/01/18 1400      Response to Exercise   Blood Pressure (Admit)  134/64    Blood Pressure (Exercise)  126/60    Blood Pressure (Exit)  116/68    Heart Rate (Admit)  72 bpm    Heart Rate (Exercise)  93 bpm    Heart Rate (Exit)  63 bpm    Oxygen Saturation (Admit)  94 %    Rating of Perceived Exertion (Exercise)  11       Nutrition:  Target Goals: Understanding of nutrition guidelines, daily intake of sodium '1500mg'$ , cholesterol '200mg'$ , calories 30% from fat and 7% or less from saturated fats, daily to have 5 or more servings of fruits and vegetables.  Biometrics: Pre Biometrics - 05/01/18 1404      Pre Biometrics   Height  '5\' 5"'$  (1.651 m)    Weight  163 lb 12.8 oz (74.3 kg)  Waist  Circumference  38.5 inches    Hip Circumference  38.5 inches    Waist to Hip Ratio  1 %    BMI (Calculated)  27.26    Single Leg Stand  27.92 seconds        Nutrition Therapy Plan and Nutrition Goals: Nutrition Therapy & Goals - 05/01/18 1443      Intervention Plan   Intervention  Prescribe, educate and counsel regarding individualized specific dietary modifications aiming towards targeted core components such as weight, hypertension, lipid management, diabetes, heart failure and other comorbidities.    Expected Outcomes  Short Term Goal: Understand basic principles of dietary content, such as calories, fat, sodium, cholesterol and nutrients.;Short Term Goal: A plan has been developed with personal nutrition goals set during dietitian appointment.;Long Term Goal: Adherence to prescribed nutrition plan.       Nutrition Assessments: Nutrition Assessments - 05/01/18 1155      MEDFICTS Scores   Pre Score  34       Nutrition Goals Re-Evaluation:   Nutrition Goals Discharge (Final Nutrition Goals Re-Evaluation):   Psychosocial: Target Goals: Acknowledge presence or absence of significant depression and/or stress, maximize coping skills, provide positive support system. Participant is able to verbalize types and ability to use techniques and skills needed for reducing stress and depression.   Initial Review & Psychosocial Screening: Initial Psych Review & Screening - 05/01/18 1448      Initial Review   Current issues with  None Identified      Family Dynamics   Good Support System?  -- Significant other  Family and Friends      Barriers   Psychosocial barriers to participate in program  The patient should benefit from training in stress management and relaxation.;There are no identifiable barriers or psychosocial needs.      Screening Interventions   Interventions  Encouraged to exercise;To provide support and resources with identified psychosocial needs;Provide feedback  about the scores to participant    Expected Outcomes  Short Term goal: Utilizing psychosocial counselor, staff and physician to assist with identification of specific Stressors or current issues interfering with healing process. Setting desired goal for each stressor or current issue identified.;Long Term Goal: Stressors or current issues are controlled or eliminated.;Short Term goal: Identification and review with participant of any Quality of Life or Depression concerns found by scoring the questionnaire.;Long Term goal: The participant improves quality of Life and PHQ9 Scores as seen by post scores and/or verbalization of changes       Quality of Life Scores:  Quality of Life - 05/01/18 1153      Quality of Life Scores   Health/Function Pre  27.83 %    Socioeconomic Pre  27.86 %    Psych/Spiritual Pre  30 %    Family Pre  30 %    GLOBAL Pre  28.6 %      Scores of 19 and below usually indicate a poorer quality of life in these areas.  A difference of  2-3 points is a clinically meaningful difference.  A difference of 2-3 points in the total score of the Quality of Life Index has been associated with significant improvement in overall quality of life, self-image, physical symptoms, and general health in studies assessing change in quality of life.  PHQ-9: Recent Review Flowsheet Data    Depression screen [facility] 2/9 05/01/2018   Decreased Interest 0   Down, Depressed, Hopeless 0   PHQ - 2 Score 0   Altered  sleeping 0   Tired, decreased energy 0   Change in appetite 0   Feeling bad or failure about yourself  0   Trouble concentrating 0   Moving slowly or fidgety/restless 0   Suicidal thoughts 0   PHQ-9 Score 0   Difficult doing work/chores Not difficult at all     Interpretation of Total Score  Total Score Depression Severity:  1-4 = Minimal depression, 5-9 = Mild depression, 10-14 = Moderate depression, 15-19 = Moderately severe depression, 20-27 = Severe depression   Psychosocial  Evaluation and Intervention:   Psychosocial Re-Evaluation:   Psychosocial Discharge (Final Psychosocial Re-Evaluation):   Vocational Rehabilitation: Provide vocational rehab assistance to qualifying candidates.   Vocational Rehab Evaluation & Intervention: Vocational Rehab - 05/01/18 1450      Initial Vocational Rehab Evaluation & Intervention   Assessment shows need for Vocational Rehabilitation  No       Education: Education Goals: Education classes will be provided on a variety of topics geared toward better understanding of heart health and risk factor modification. Participant will state understanding/return demonstration of topics presented as noted by education test scores.  Learning Barriers/Preferences: Learning Barriers/Preferences - 05/01/18 1449      Learning Barriers/Preferences   Learning Barriers  None    Learning Preferences  None       Education Topics:  AED/CPR: - Group verbal and written instruction with the use of models to demonstrate the basic use of the AED with the basic ABC's of resuscitation.   General Nutrition Guidelines/Fats and Fiber: -Group instruction provided by verbal, written material, models and posters to present the general guidelines for heart healthy nutrition. Gives an explanation and review of dietary fats and fiber.   Controlling Sodium/Reading Food Labels: -Group verbal and written material supporting the discussion of sodium use in heart healthy nutrition. Review and explanation with models, verbal and written materials for utilization of the food label.   Exercise Physiology & General Exercise Guidelines: - Group verbal and written instruction with models to review the exercise physiology of the cardiovascular system and associated critical values. Provides general exercise guidelines with specific guidelines to those with heart or lung disease.    Aerobic Exercise & Resistance Training: - Gives group verbal and written  instruction on the various components of exercise. Focuses on aerobic and resistive training programs and the benefits of this training and how to safely progress through these programs..   Flexibility, Balance, Mind/Body Relaxation: Provides group verbal/written instruction on the benefits of flexibility and balance training, including mind/body exercise modes such as yoga, pilates and tai chi.  Demonstration and skill practice provided.   Stress and Anxiety: - Provides group verbal and written instruction about the health risks of elevated stress and causes of high stress.  Discuss the correlation between heart/lung disease and anxiety and treatment options. Review healthy ways to manage with stress and anxiety.   Depression: - Provides group verbal and written instruction on the correlation between heart/lung disease and depressed mood, treatment options, and the stigmas associated with seeking treatment.   Anatomy & Physiology of the Heart: - Group verbal and written instruction and models provide basic cardiac anatomy and physiology, with the coronary electrical and arterial systems. Review of Valvular disease and Heart Failure   Cardiac Procedures: - Group verbal and written instruction to review commonly prescribed medications for heart disease. Reviews the medication, class of the drug, and side effects. Includes the steps to properly store meds and maintain the prescription  regimen. (beta blockers and nitrates)   Cardiac Medications I: - Group verbal and written instruction to review commonly prescribed medications for heart disease. Reviews the medication, class of the drug, and side effects. Includes the steps to properly store meds and maintain the prescription regimen.   Cardiac Medications II: -Group verbal and written instruction to review commonly prescribed medications for heart disease. Reviews the medication, class of the drug, and side effects. (all other drug  classes)    Go Sex-Intimacy & Heart Disease, Get SMART - Goal Setting: - Group verbal and written instruction through game format to discuss heart disease and the return to sexual intimacy. Provides group verbal and written material to discuss and apply goal setting through the application of the S.M.A.R.T. Method.   Other Matters of the Heart: - Provides group verbal, written materials and models to describe Stable Angina and Peripheral Artery. Includes description of the disease process and treatment options available to the cardiac patient.   Exercise & Equipment Safety: - Individual verbal instruction and demonstration of equipment use and safety with use of the equipment.   Cardiac Rehab from 05/01/2018 in [facility] Cardiac and Pulmonary Rehab  Date  05/01/18  Educator  [facility]  Instruction Review Code  1- Verbalizes Understanding      Infection Prevention: - Provides verbal and written material to individual with discussion of infection control including proper hand washing and proper equipment cleaning during exercise session.   Cardiac Rehab from 05/01/2018 in [facility] Cardiac and Pulmonary Rehab  Date  05/01/18  Educator  [facility]  Instruction Review Code  1- Verbalizes Understanding      Falls Prevention: - Provides verbal and written material to individual with discussion of falls prevention and safety.   Cardiac Rehab from 05/01/2018 in [facility] Cardiac and Pulmonary Rehab  Date  05/01/18  Educator  [facility]  Instruction Review Code  1- Verbalizes Understanding      Diabetes: - Individual verbal and written instruction to review signs/symptoms of diabetes, desired ranges of glucose level fasting, after meals and with exercise. Acknowledge that pre and post exercise glucose checks will be done for 3 sessions at entry of program.   Cardiac Rehab from 05/01/2018 in [facility] Cardiac and Pulmonary Rehab  Date  05/01/18  Educator  [place]  Instruction Review Code  1- Verbalizes Understanding      Know  Your Numbers and Risk Factors: -Group verbal and written instruction about important numbers in your health.  Discussion of what are risk factors and how they play a role in the disease process.  Review of Cholesterol, Blood Pressure, Diabetes, and BMI and the role they play in your overall health.   Sleep Hygiene: -Provides group verbal and written instruction about how sleep can affect your health.  Define sleep hygiene, discuss sleep cycles and impact of sleep habits. Review good sleep hygiene tips.    Other: -Provides group and verbal instruction on various topics (see comments)   Knowledge Questionnaire Score: Knowledge Questionnaire Score - 05/01/18 1152      Knowledge Questionnaire Score   Pre Score  26/28 Reviewed correct responses with Deidre Ala today. He verbalized understanding and had no further questions       Core Components/Risk Factors/Patient Goals at Admission: Personal Goals and Risk Factors at Admission - 05/01/18 1443      Core Components/Risk Factors/Patient Goals on Admission    Weight Management  Weight Maintenance;Yes    Intervention  Weight Management: Develop a combined nutrition and exercise program designed  to reach desired caloric intake, while maintaining appropriate intake of nutrient and fiber, sodium and fats, and appropriate energy expenditure required for the weight goal.;Weight Management: Provide education and appropriate resources to help participant work on and attain dietary goals.    Admit Weight  163 lb 12.8 oz (74.3 kg) Was in hospital for over 4 weeks, loos muscle mass and weight. Wants to regain muscle mass.     Goal Weight: Short Term  165 lb (74.8 kg)    Goal Weight: Long Term  170 lb (77.1 kg)    Expected Outcomes  Short Term: Continue to assess and modify interventions until short term weight is achieved;Long Term: Adherence to nutrition and physical activity/exercise program aimed toward attainment of established weight goal;Weight  Maintenance: Understanding of the daily nutrition guidelines, which includes 25-35% calories from fat, 7% or less cal from saturated fats, less than '200mg'$  cholesterol, less than 1.5gm of sodium, & 5 or more servings of fruits and vegetables daily    Diabetes  Yes    Intervention  Provide education about signs/symptoms and action to take for hypo/hyperglycemia.;Provide education about proper nutrition, including hydration, and aerobic/resistive exercise prescription along with prescribed medications to achieve blood glucose in normal ranges: Fasting glucose 65-99 mg/dL    Expected Outcomes  Short Term: Participant verbalizes understanding of the signs/symptoms and immediate care of hyper/hypoglycemia, proper foot care and importance of medication, aerobic/resistive exercise and nutrition plan for blood glucose control.;Long Term: Attainment of HbA1C < 7%.    Hypertension  Yes    Intervention  Provide education on lifestyle modifcations including regular physical activity/exercise, weight management, moderate sodium restriction and increased consumption of fresh fruit, vegetables, and low fat dairy, alcohol moderation, and smoking cessation.;Monitor prescription use compliance.    Expected Outcomes  Short Term: Continued assessment and intervention until BP is < 140/70m HG in hypertensive participants. < 130/883mHG in hypertensive participants with diabetes, heart failure or chronic kidney disease.;Long Term: Maintenance of blood pressure at goal levels.    Lipids  Yes    Intervention  Provide education and support for participant on nutrition & aerobic/resistive exercise along with prescribed medications to achieve LDL '70mg'$ , HDL >'40mg'$ .    Expected Outcomes  Short Term: Participant states understanding of desired cholesterol values and is compliant with medications prescribed. Participant is following exercise prescription and nutrition guidelines.;Long Term: Cholesterol controlled with medications as  prescribed, with individualized exercise RX and with personalized nutrition plan. Value goals: LDL < '70mg'$ , HDL > 40 mg.    Personal Goal Other  Yes    Personal Goal  Wants to get back to riding his [place] muscle mass will in the hospital for over 4 weeks    Intervention  Provide exercise prescription to work on musvcle mass gain, strength and stamina    Expected Outcomes  ST: [place] start to build his muscle strength and stamina while following his exercise prescription. [id number] be able to ride his HaMarkus Daftgain without concern       Core Components/Risk Factors/Patient Goals Review:    Core Components/Risk Factors/Patient Goals at Discharge (Final Review):    ITP Comments: ITP Comments    Row Name 05/01/18 1155           ITP Comments  Medical review complete, ITP sent to Dr M Loleta Chanceor review,changes as needed and signature.  [facility]          Comments:

## 2018-05-01 NOTE — Progress Notes (Signed)
Daily Session Note  Patient Details  Name: Harold Parker MRN: [id number] Date of Birth: 08/08/1946 Referring Provider:     Cardiac Rehab from 05/01/2018 in [facility]  Referring Provider  Arida      Encounter Date: 05/01/2018  Check In: Session Check In - 05/01/18 1151      Check-In   Location  [facility]    Staff Present  Susanne Bice, RN, BSN, CCRP;Amanda Sommer, BA, ACSM CEP, Exercise Physiologist;Meredith Craven, RN BSN    Supervising physician immediately available to respond to emergencies  See telemetry face sheet for immediately available ER MD    Warm-up and Cool-down  Performed as group-led instruction    Resistance Training Performed  Yes    VAD Patient?  No      Pain Assessment   Currently in Pain?  No/denies        Exercise Prescription Changes - 05/01/18 1400      Response to Exercise   Blood Pressure (Admit)  134/64    Blood Pressure (Exercise)  126/60    Blood Pressure (Exit)  116/68    Heart Rate (Admit)  72 bpm    Heart Rate (Exercise)  93 bpm    Heart Rate (Exit)  63 bpm    Oxygen Saturation (Admit)  94 %    Rating of Perceived Exertion (Exercise)  11       Social History   Tobacco Use  Smoking Status Former Smoker  . Packs/day: 1.00  . Years: 15.00  . Pack years: 15.00  . Types: Cigarettes  . Last attempt to quit: 02/02/2018  . Years since quitting: 0.2  Smokeless Tobacco Never Used  Tobacco Comment   Quit 01/2018 - on 05/01/2018 talked to Harold Parker about Rel[place] concerns. He ststaes that he has no taste for tobacco since he quit in Feb.    Goals Met:  Exercise tolerated well Personal goals reviewed No report of cardiac concerns or symptoms Strength training completed today  Goals Unmet:  Not Applicable  Comments: Med review completed today   Dr. Mark Miller is Medical Director for [facility] and [facility]. 

## 2018-05-01 NOTE — Patient Instructions (Signed)
Patient Instructions  Patient Details  Name: Harold Parker MRN: [id number] Date of Birth: 1946/05/09 Referring Provider:  Iran Ouch, MD  Below are your personal goals for exercise, nutrition, and risk factors. Our goal is to help you stay on track towards obtaining and maintaining these goals. We will be discussing your progress on these goals with you throughout the program.  Initial Exercise Prescription: Initial Exercise Prescription - 05/01/18 1400      Date of Initial Exercise RX and Referring Provider   Date  05/01/18    Referring Provider  Harold Parker      Treadmill   MPH  1.5    Grade  0    Minutes  15    METs  2.15      Recumbant Bike   Level  1    RPM  60    Watts  16    Minutes  15    METs  2      NuStep   Level  2    SPM  80    Minutes  15    METs  2      Prescription Details   Frequency (times per week)  2    Duration  Progress to 30 minutes of continuous aerobic without signs/symptoms of physical distress      Intensity   THRR 40-80% of Max Heartrate  105-133    Ratings of Perceived Exertion  11-13    Perceived Dyspnea  0-4      Resistance Training   Training Prescription  Yes    Weight  3 lb    Reps  10-15       Exercise Goals: Frequency: Be able to perform aerobic exercise two to three times per week in program working toward 2-5 days per week of home exercise.  Intensity: Work with a perceived exertion of 11 (fairly light) - 15 (hard) while following your exercise prescription.  We will make changes to your prescription with you as you progress through the program.   Duration: Be able to do 30 to 45 minutes of continuous aerobic exercise in addition to a 5 minute warm-up and a 5 minute cool-down routine.   Nutrition Goals: Your personal nutrition goals will be established when you do your nutrition analysis with the dietician.  The following are general nutrition guidelines to follow: Cholesterol < 200mg /day Sodium < 1500mg /day Fiber:  Men over 50 yrs - 30 grams per day  Personal Goals: Personal Goals and Risk Factors at Admission - 05/01/18 1443      Core Components/Risk Factors/Patient Goals on Admission    Weight Management  Weight Maintenance;Yes    Intervention  Weight Management: Develop a combined nutrition and exercise program designed to reach desired caloric intake, while maintaining appropriate intake of nutrient and fiber, sodium and fats, and appropriate energy expenditure required for the weight goal.;Weight Management: Provide education and appropriate resources to help participant work on and attain dietary goals.    Admit Weight  163 lb 12.8 oz (74.3 kg) Was in hospital for over 4 weeks, loos muscle mass and weight. Wants to regain muscle mass.     Goal Weight: Short Term  165 lb (74.8 kg)    Goal Weight: Long Term  170 lb (77.1 kg)    Expected Outcomes  Short Term: Continue to assess and modify interventions until short term weight is achieved;Long Term: Adherence to nutrition and physical activity/exercise program aimed toward attainment of established weight goal;Weight Maintenance: Understanding  of the daily nutrition guidelines, which includes 25-35% calories from fat, 7% or less cal from saturated fats, less than 200mg  cholesterol, less than 1.5gm of sodium, & 5 or more servings of fruits and vegetables daily    Diabetes  Yes    Intervention  Provide education about signs/symptoms and action to take for hypo/hyperglycemia.;Provide education about proper nutrition, including hydration, and aerobic/resistive exercise prescription along with prescribed medications to achieve blood glucose in normal ranges: Fasting glucose 65-99 mg/dL    Expected Outcomes  Short Term: Participant verbalizes understanding of the signs/symptoms and immediate care of hyper/hypoglycemia, proper foot care and importance of medication, aerobic/resistive exercise and nutrition plan for blood glucose control.;Long Term: Attainment of HbA1C  < 7%.    Hypertension  Yes    Intervention  Provide education on lifestyle modifcations including regular physical activity/exercise, weight management, moderate sodium restriction and increased consumption of fresh fruit, vegetables, and low fat dairy, alcohol moderation, and smoking cessation.;Monitor prescription use compliance.    Expected Outcomes  Short Term: Continued assessment and intervention until BP is < 140/85mm HG in hypertensive participants. < 130/32mm HG in hypertensive participants with diabetes, heart failure or chronic kidney disease.;Long Term: Maintenance of blood pressure at goal levels.    Lipids  Yes    Intervention  Provide education and support for participant on nutrition & aerobic/resistive exercise along with prescribed medications to achieve LDL 70mg , HDL >40mg .    Expected Outcomes  Short Term: Participant states understanding of desired cholesterol values and is compliant with medications prescribed. Participant is following exercise prescription and nutrition guidelines.;Long Term: Cholesterol controlled with medications as prescribed, with individualized exercise RX and with personalized nutrition plan. Value goals: LDL < 70mg , HDL > 40 mg.    Personal Goal Other  Yes    Personal Goal  Wants to get back to riding his Harold Parker. Lost muscle mass will in the hospital for over 4 weeks    Intervention  Provide exercise prescription to work on musvcle mass gain, strength and stamina    Expected Outcomes  ST: Harold Parker will start to build his muscle strength and stamina while following his exercise prescription. [id number] will be able to ride his Harold Parker again without concern       Tobacco Use Initial Evaluation: Social History   Tobacco Use  Smoking Status Former Smoker  . Packs/day: 1.00  . Years: 15.00  . Pack years: 15.00  . Types: Cigarettes  . Last attempt to quit: 02/02/2018  . Years since quitting: 0.2  Smokeless Tobacco Never Used  Tobacco Comment   Quit 01/2018  - on 05/01/2018 talked to Harold Parker about Harold Parker concerns. He ststaes that he has no taste for tobacco since he quit in Feb.    Exercise Goals and Review: Exercise Goals    Row Name 05/01/18 1405             Exercise Goals   Increase Physical Activity  Yes       Intervention  Provide advice, education, support and counseling about physical activity/exercise needs.;Develop an individualized exercise prescription for aerobic and resistive training based on initial evaluation findings, risk stratification, comorbidities and participant's personal goals.       Expected Outcomes  Short Term: Attend rehab on a regular basis to increase amount of physical activity.;Long Term: Add in home exercise to make exercise part of routine and to increase amount of physical activity.;Long Term: Exercising regularly at least 3-5 days a week.  Increase Strength and Stamina  Yes       Intervention  Provide advice, education, support and counseling about physical activity/exercise needs.;Develop an individualized exercise prescription for aerobic and resistive training based on initial evaluation findings, risk stratification, comorbidities and participant's personal goals.       Expected Outcomes  Short Term: Increase workloads from initial exercise prescription for resistance, speed, and METs.;Short Term: Perform resistance training exercises routinely during rehab and add in resistance training at home;Long Term: Improve cardiorespiratory fitness, muscular endurance and strength as measured by increased METs and functional capacity ( )       Able to understand and use rate of perceived exertion (RPE) scale  Yes       Intervention  Provide education and explanation on how to use RPE scale       Expected Outcomes  Short Term: Able to use RPE daily in rehab to express subjective intensity level;Long Term:  Able to use RPE to guide intensity level when exercising independently       Able to understand and use  Dyspnea scale  Yes       Intervention  Provide education and explanation on how to use Dyspnea scale       Expected Outcomes  Short Term: Able to use Dyspnea scale daily in rehab to express subjective sense of shortness of breath during exertion;Long Term: Able to use Dyspnea scale to guide intensity level when exercising independently       Knowledge and understanding of Target Heart Rate Range (THRR)  Yes       Intervention  Provide education and explanation of THRR including how the numbers were predicted and where they are located for reference       Expected Outcomes  Short Term: Able to state/look up THRR;Short Term: Able to use daily as guideline for intensity in rehab;Long Term: Able to use THRR to govern intensity when exercising independently       Able to check pulse independently  Yes       Intervention  Provide education and demonstration on how to check pulse in carotid and radial arteries.;Review the importance of being able to check your own pulse for safety during independent exercise       Expected Outcomes  Short Term: Able to explain why pulse checking is important during independent exercise;Long Term: Able to check pulse independently and accurately       Understanding of Exercise Prescription  Yes       Intervention  Provide education, explanation, and written materials on patient's individual exercise prescription       Expected Outcomes  Short Term: Able to explain program exercise prescription;Long Term: Able to explain home exercise prescription to exercise independently          Copy of goals given to participant.

## 2018-05-05 DIAGNOSIS — J438 Other emphysema: Secondary | ICD-10-CM | POA: Diagnosis not present

## 2018-05-05 DIAGNOSIS — E1121 Type 2 diabetes mellitus with diabetic nephropathy: Secondary | ICD-10-CM | POA: Diagnosis not present

## 2018-05-05 DIAGNOSIS — D508 Other iron deficiency anemias: Secondary | ICD-10-CM | POA: Diagnosis not present

## 2018-05-05 DIAGNOSIS — Z7984 Long term (current) use of oral hypoglycemic drugs: Secondary | ICD-10-CM | POA: Diagnosis not present

## 2018-05-05 DIAGNOSIS — N183 Chronic kidney disease, stage 3 (moderate): Secondary | ICD-10-CM | POA: Diagnosis not present

## 2018-05-05 DIAGNOSIS — E7800 Pure hypercholesterolemia, unspecified: Secondary | ICD-10-CM | POA: Diagnosis not present

## 2018-05-05 DIAGNOSIS — I700 Atherosclerosis of aorta: Secondary | ICD-10-CM | POA: Diagnosis not present

## 2018-05-07 ENCOUNTER — Encounter: Payer: Self-pay | Admitting: *Deleted

## 2018-05-07 DIAGNOSIS — Z951 Presence of aortocoronary bypass graft: Secondary | ICD-10-CM

## 2018-05-07 DIAGNOSIS — Z952 Presence of prosthetic heart valve: Secondary | ICD-10-CM

## 2018-05-07 NOTE — Progress Notes (Signed)
Cardiac Individual Treatment Plan  Patient Details  Name: Harold Parker MRN: [id number] Date of Birth: [birth year] Referring Provider:     Cardiac Rehab from 05/01/2018 in [facility] Cardiac and Pulmonary Rehab  Referring Provider  Arida      Initial Encounter Date:    Cardiac Rehab from 05/01/2018 in [facility] Cardiac and Pulmonary Rehab  Date  05/01/18  Referring Provider  Fletcher Anon      Visit Diagnosis: S/P CABG x 3  S/P aortic valve replacement  Patient's Home Medications on Admission:  Current Outpatient Medications:  .  ALPRAZolam (XANAX) 0.5 MG tablet, Take 0.5 mg by mouth at bedtime as needed for anxiety., Disp: , Rfl:  .  Ascorbic Acid (VITAMIN C) 1000 MG tablet, Take 1,000 mg by mouth daily., Disp: , Rfl:  .  aspirin EC 81 MG tablet, Take 81 mg by mouth daily., Disp: , Rfl:  .  atorvastatin (LIPITOR) 80 MG tablet, TAKE 1 TABLET BY MOUTH DAILY AT 6 PM, Disp: 30 tablet, Rfl: 0 .  clopidogrel (PLAVIX) 75 MG tablet, TAKE 1 TABLET BY MOUTH DAILY (Patient not taking: Reported on 05/01/2018), Disp: 30 tablet, Rfl: 0 .  ezetimibe (ZETIA) 10 MG tablet, Take 10 mg by mouth daily., Disp: , Rfl:  .  folic acid (FOLVITE) 048 MCG tablet, Take 400 mcg by mouth every evening., Disp: , Rfl:  .  furosemide (LASIX) 40 MG tablet, Take 1 tablet (40 mg) by mouth once daily, Disp: 90 tablet, Rfl: 3 .  glipiZIDE (GLUCOTROL) 5 MG tablet, Take 0.5 tablets (2.5 mg total) by mouth daily before breakfast., Disp: 30 tablet, Rfl: 1 .  ipratropium-albuterol (DUONEB) 0.5-2.5 (3) MG/3ML SOLN, Take 3 mLs by nebulization every 6 (six) hours as needed., Disp: 360 mL, Rfl: 1 .  losartan (COZAAR) 100 MG tablet, Take 1 tablet (100 mg total) by mouth daily., Disp: 90 tablet, Rfl: 3 .  metFORMIN (GLUCOPHAGE) 1000 MG tablet, TAKE 1 TABLET BY MOUTH TWICE DAILY WITH A MEAL, Disp: 60 tablet, Rfl: 0 .  metoprolol tartrate (LOPRESSOR) 50 MG tablet, TAKE 1 TABLET BY MOUTH TWICE DAILY, Disp: 60 tablet, Rfl: 0 .  pantoprazole (PROTONIX)  40 MG tablet, Take 1 tablet (40 mg total) by mouth daily., Disp: 30 tablet, Rfl: 0 .  traMADol (ULTRAM) 50 MG tablet, TAKE 1 TABLET BY MOUTH EVERY 6 HOURS AS NEEDED, Disp: 40 tablet, Rfl: 0 .  Vitamin D, Cholecalciferol, 1000 units TABS, Take 1 tablet by mouth every morning. , Disp: , Rfl:   Past Medical History: Past Medical History:  Diagnosis Date  . Bilateral carotid bruits    a. 01/2018 U/S: < 50% bilat ICA stenosis.  Marland Kitchen CAD (coronary artery disease)    a. 1998 s/p MI and BMS [facility], [place]); b. 1999 redo PCI/rotablator in setting of what sounds like ISR;  c. Multiple stress tests over the years - last ~ 2017, reportedly nl; d. 01/2018 NSTEMI/Cath: LM 75md, LAD 50p, 40p/m, D1 60ost, OM1 95, RCA 100ost/p w/ L->R collats, EF 45%; e. s/p 3V CABG 02/19/18 (LIMA-LAD, VG-D1, VG-OM)  . Chronic lower back pain   . COPD (chronic obstructive pulmonary disease) ([place]   . GIB (gastrointestinal bleeding)    a. 02/2018 GIB and anemia w/ Hgb of 4.7 on presentation; b. 03/2018 EGD: 2 nonbleeding duodenal ulcers.  .Marland KitchenHTN (hypertension)   . Hypercholesteremia   . Ischemic cardiomyopathy    a. 01/2018 Echo: EF 40-45%, mid-apicalanteroseptal, ant, apical sev HK, mod apicalinferior HK. Gr2 DD. Mod AS, mild  MR, mod dil LA, PASP 52mHg.  . Moderate aortic stenosis    a. 01/2018 Echo: Mod AS, mean grad (S) 22mg, Valve area (VTI) 1.06 cm^2, (Vmax) 1.27 cm^2; b. 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  . Myocardial infarction ([place] 1998[place]1999  . 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 02/19/2018 AVR: 25 mm Edwards Inspiris Resilia stented bovine pericardial tissue valve  . 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 Tobacco abuse   . Type II diabetes mellitus (HCC)     Tobacco Use: Social History   Tobacco Use  Smoking Status Former Smoker  . Packs/day: 1.00  . Years: 15.00  . Pack years: 15.00  . Types: Cigarettes  . Last attempt to  quit: 02/02/2018  . Years since quitting: 0.2  Smokeless Tobacco Never Used  Tobacco Comment   Quit 01/2018 - on 05/01/2018 talked to RaYukibout Relaspe concerns. He ststaes that he has no taste for tobacco since he quit in Feb.    Labs: Recent Review Flowsheet Data    Labs for ITP Cardiac and Pulmonary Rehab Latest Ref Rng & Units 02/19/2018 02/19/2018 02/20/2018 02/20/2018 03/27/2018   Cholestrol 100 - 199 mg/dL - - - - 102   LDLCALC 0 - 99 mg/dL - - - - 50   HDL >39 mg/dL - - - - 35(L)   Trlycerides 0 - 149 mg/dL - - - - 84   Hemoglobin A1c 4.8 - 5.6 % - - - - -   PHART 7.350 - 7.450 - 7.335(L) 7.361 7.331(L) -   PCO2ART 32.0 - 48.0 mmHg - 40.2 40.1 41.0 -   HCO3 20.0 - 28.0 mmol/L - 21.2 22.5 21.5 -   TCO2 22 - 32 mmol/L '24 22 24 23 '$ -   ACIDBASEDEF 0.0 - 2.0 mmol/L - 4.0(H) 2.0 4.0(H) -   O2SAT % - 97.0 97.0 95.0 -       Exercise Target Goals:    Exercise Program Goal: Individual exercise prescription set using results from initial 6 min walk test and THRR while considering  patient's activity barriers and safety.   Exercise Prescription Goal: Initial exercise prescription builds to 30-45 minutes a day of aerobic activity, 2-3 days per week.  Home exercise guidelines will be given to patient during program as part of exercise prescription that the participant will acknowledge.  Activity Barriers & Risk Stratification: Activity Barriers & Cardiac Risk Stratification - 05/01/18 1450      Activity Barriers & Cardiac Risk Stratification   Activity Barriers  Back Problems HIstory of back injury years ago.  He has lidocaine patches and epidurals as needed       6 Minute Walk: 6 Minute Walk    Row Name 05/01/18 1406         6 Minute Walk   Distance  888 feet     Walk Time  6 minutes     # of Rest Breaks  0     MPH  1.68     METS  2.04     RPE  11     Perceived Dyspnea   0     VO2 Peak  7.17     Symptoms  No     Resting HR  78 bpm     Resting BP  134/64     Resting Oxygen  Saturation   94 %  Exercise Oxygen Saturation  during 6 min walk  94 %     Max Ex. HR  110 bpm     Max Ex. BP  126/60     2 Minute Post BP  116/68        Oxygen Initial Assessment:   Oxygen Re-Evaluation:   Oxygen Discharge (Final Oxygen Re-Evaluation):   Initial Exercise Prescription: Initial Exercise Prescription - 05/01/18 1400      Date of Initial Exercise RX and Referring Provider   Date  05/01/18    Referring Provider  Arida      Treadmill   MPH  1.5    Grade  0    Minutes  15    METs  2.15      Recumbant Bike   Level  1    RPM  60    Watts  16    Minutes  15    METs  2      NuStep   Level  2    SPM  80    Minutes  15    METs  2      Prescription Details   Frequency (times per week)  2    Duration  Progress to 30 minutes of continuous aerobic without signs/symptoms of physical distress      Intensity   THRR 40-80% of Max Heartrate  105-133    Ratings of Perceived Exertion  11-13    Perceived Dyspnea  0-4      Resistance Training   Training Prescription  Yes    Weight  3 lb    Reps  10-15       Perform Capillary Blood Glucose checks as needed.  Exercise Prescription Changes: Exercise Prescription Changes    Row Name 05/01/18 1400             Response to Exercise   Blood Pressure (Admit)  134/64       Blood Pressure (Exercise)  126/60       Blood Pressure (Exit)  116/68       Heart Rate (Admit)  72 bpm       Heart Rate (Exercise)  93 bpm       Heart Rate (Exit)  63 bpm       Oxygen Saturation (Admit)  94 %       Rating of Perceived Exertion (Exercise)  11          Exercise Comments:   Exercise Goals and Review: Exercise Goals    Row Name 05/01/18 1405             Exercise Goals   Increase Physical Activity  Yes       Intervention  Provide advice, education, support and counseling about physical activity/exercise needs.;Develop an individualized exercise prescription for aerobic and resistive training based on initial  evaluation findings, risk stratification, comorbidities and participant's personal goals.       Expected Outcomes  Short Term: Attend rehab on a regular basis to increase amount of physical activity.;Long Term: Add in home exercise to make exercise part of routine and to increase amount of physical activity.;Long Term: Exercising regularly at least 3-5 days a week.       Increase Strength and Stamina  Yes       Intervention  Provide advice, education, support and counseling about physical activity/exercise needs.;Develop an individualized exercise prescription for aerobic and resistive training based on initial evaluation findings, risk stratification, comorbidities and participant's personal goals.  Expected Outcomes  Short Term: Increase workloads from initial exercise prescription for resistance, speed, and METs.;Short Term: Perform resistance training exercises routinely during rehab and add in resistance training at home;Long Term: Improve cardiorespiratory fitness, muscular endurance and strength as measured by increased METs and functional capacity (6MWT)       Able to understand and use rate of perceived exertion (RPE) scale  Yes       Intervention  Provide education and explanation on how to use RPE scale       Expected Outcomes  Short Term: Able to use RPE daily in rehab to express subjective intensity level;Long Term:  Able to use RPE to guide intensity level when exercising independently       Able to understand and use Dyspnea scale  Yes       Intervention  Provide education and explanation on how to use Dyspnea scale       Expected Outcomes  Short Term: Able to use Dyspnea scale daily in rehab to express subjective sense of shortness of breath during exertion;Long Term: Able to use Dyspnea scale to guide intensity level when exercising independently       Knowledge and understanding of Target Heart Rate Range (THRR)  Yes       Intervention  Provide education and explanation of THRR  including how the numbers were predicted and where they are located for reference       Expected Outcomes  Short Term: Able to state/look up THRR;Short Term: Able to use daily as guideline for intensity in rehab;Long Term: Able to use THRR to govern intensity when exercising independently       Able to check pulse independently  Yes       Intervention  Provide education and demonstration on how to check pulse in carotid and radial arteries.;Review the importance of being able to check your own pulse for safety during independent exercise       Expected Outcomes  Short Term: Able to explain why pulse checking is important during independent exercise;Long Term: Able to check pulse independently and accurately       Understanding of Exercise Prescription  Yes       Intervention  Provide education, explanation, and written materials on patient's individual exercise prescription       Expected Outcomes  Short Term: Able to explain program exercise prescription;Long Term: Able to explain home exercise prescription to exercise independently          Exercise Goals Re-Evaluation :   Discharge Exercise Prescription (Final Exercise Prescription Changes): Exercise Prescription Changes - 05/01/18 1400      Response to Exercise   Blood Pressure (Admit)  134/64    Blood Pressure (Exercise)  126/60    Blood Pressure (Exit)  116/68    Heart Rate (Admit)  72 bpm    Heart Rate (Exercise)  93 bpm    Heart Rate (Exit)  63 bpm    Oxygen Saturation (Admit)  94 %    Rating of Perceived Exertion (Exercise)  11       Nutrition:  Target Goals: Understanding of nutrition guidelines, daily intake of sodium '1500mg'$ , cholesterol '200mg'$ , calories 30% from fat and 7% or less from saturated fats, daily to have 5 or more servings of fruits and vegetables.  Biometrics: Pre Biometrics - 05/01/18 1404      Pre Biometrics   Height  '5\' 5"'$  (1.651 m)    Weight  163 lb 12.8 oz (74.3 kg)  Waist Circumference  38.5  inches    Hip Circumference  38.5 inches    Waist to Hip Ratio  1 %    BMI (Calculated)  27.26    Single Leg Stand  27.92 seconds        Nutrition Therapy Plan and Nutrition Goals: Nutrition Therapy & Goals - 05/01/18 1443      Intervention Plan   Intervention  Prescribe, educate and counsel regarding individualized specific dietary modifications aiming towards targeted core components such as weight, hypertension, lipid management, diabetes, heart failure and other comorbidities.    Expected Outcomes  Short Term Goal: Understand basic principles of dietary content, such as calories, fat, sodium, cholesterol and nutrients.;Short Term Goal: A plan has been developed with personal nutrition goals set during dietitian appointment.;Long Term Goal: Adherence to prescribed nutrition plan.       Nutrition Assessments: Nutrition Assessments - 05/01/18 1155      MEDFICTS Scores   Pre Score  34       Nutrition Goals Re-Evaluation:   Nutrition Goals Discharge (Final Nutrition Goals Re-Evaluation):   Psychosocial: Target Goals: Acknowledge presence or absence of significant depression and/or stress, maximize coping skills, provide positive support system. Participant is able to verbalize types and ability to use techniques and skills needed for reducing stress and depression.   Initial Review & Psychosocial Screening: Initial Psych Review & Screening - 05/01/18 1448      Initial Review   Current issues with  None Identified      Family Dynamics   Good Support System?  -- Significant other  Family and Friends      Barriers   Psychosocial barriers to participate in program  The patient should benefit from training in stress management and relaxation.;There are no identifiable barriers or psychosocial needs.      Screening Interventions   Interventions  Encouraged to exercise;To provide support and resources with identified psychosocial needs;Provide feedback about the scores to  participant    Expected Outcomes  Short Term goal: Utilizing psychosocial counselor, staff and physician to assist with identification of specific Stressors or current issues interfering with healing process. Setting desired goal for each stressor or current issue identified.;Long Term Goal: Stressors or current issues are controlled or eliminated.;Short Term goal: Identification and review with participant of any Quality of Life or Depression concerns found by scoring the questionnaire.;Long Term goal: The participant improves quality of Life and PHQ9 Scores as seen by post scores and/or verbalization of changes       Quality of Life Scores:  Quality of Life - 05/01/18 1153      Quality of Life Scores   Health/Function Pre  27.83 %    Socioeconomic Pre  27.86 %    Psych/Spiritual Pre  30 %    Family Pre  30 %    GLOBAL Pre  28.6 %      Scores of 19 and below usually indicate a poorer quality of life in these areas.  A difference of  2-3 points is a clinically meaningful difference.  A difference of 2-3 points in the total score of the Quality of Life Index has been associated with significant improvement in overall quality of life, self-image, physical symptoms, and general health in studies assessing change in quality of life.  PHQ-9: Recent Review Flowsheet Data    Depression screen [facility] 2/9 05/01/2018   Decreased Interest 0   Down, Depressed, Hopeless 0   PHQ - 2 Score 0   Altered  sleeping 0   Tired, decreased energy 0   Change in appetite 0   Feeling bad or failure about yourself  0   Trouble concentrating 0   Moving slowly or fidgety/restless 0   Suicidal thoughts 0   PHQ-9 Score 0   Difficult doing work/chores Not difficult at all     Interpretation of Total Score  Total Score Depression Severity:  1-4 = Minimal depression, 5-9 = Mild depression, 10-14 = Moderate depression, 15-19 = Moderately severe depression, 20-27 = Severe depression   Psychosocial Evaluation and  Intervention:   Psychosocial Re-Evaluation:   Psychosocial Discharge (Final Psychosocial Re-Evaluation):   Vocational Rehabilitation: Provide vocational rehab assistance to qualifying candidates.   Vocational Rehab Evaluation & Intervention: Vocational Rehab - 05/01/18 1450      Initial Vocational Rehab Evaluation & Intervention   Assessment shows need for Vocational Rehabilitation  No       Education: Education Goals: Education classes will be provided on a variety of topics geared toward better understanding of heart health and risk factor modification. Participant will state understanding/return demonstration of topics presented as noted by education test scores.  Learning Barriers/Preferences: Learning Barriers/Preferences - 05/01/18 1449      Learning Barriers/Preferences   Learning Barriers  None    Learning Preferences  None       Education Topics:  AED/CPR: - Group verbal and written instruction with the use of models to demonstrate the basic use of the AED with the basic ABC's of resuscitation.   General Nutrition Guidelines/Fats and Fiber: -Group instruction provided by verbal, written material, models and posters to present the general guidelines for heart healthy nutrition. Gives an explanation and review of dietary fats and fiber.   Controlling Sodium/Reading Food Labels: -Group verbal and written material supporting the discussion of sodium use in heart healthy nutrition. Review and explanation with models, verbal and written materials for utilization of the food label.   Exercise Physiology & General Exercise Guidelines: - Group verbal and written instruction with models to review the exercise physiology of the cardiovascular system and associated critical values. Provides general exercise guidelines with specific guidelines to those with heart or lung disease.    Aerobic Exercise & Resistance Training: - Gives group verbal and written instruction on  the various components of exercise. Focuses on aerobic and resistive training programs and the benefits of this training and how to safely progress through these programs..   Flexibility, Balance, Mind/Body Relaxation: Provides group verbal/written instruction on the benefits of flexibility and balance training, including mind/body exercise modes such as yoga, pilates and tai chi.  Demonstration and skill practice provided.   Stress and Anxiety: - Provides group verbal and written instruction about the health risks of elevated stress and causes of high stress.  Discuss the correlation between heart/lung disease and anxiety and treatment options. Review healthy ways to manage with stress and anxiety.   Depression: - Provides group verbal and written instruction on the correlation between heart/lung disease and depressed mood, treatment options, and the stigmas associated with seeking treatment.   Anatomy & Physiology of the Heart: - Group verbal and written instruction and models provide basic cardiac anatomy and physiology, with the coronary electrical and arterial systems. Review of Valvular disease and Heart Failure   Cardiac Procedures: - Group verbal and written instruction to review commonly prescribed medications for heart disease. Reviews the medication, class of the drug, and side effects. Includes the steps to properly store meds and maintain the prescription  regimen. (beta blockers and nitrates)   Cardiac Medications I: - Group verbal and written instruction to review commonly prescribed medications for heart disease. Reviews the medication, class of the drug, and side effects. Includes the steps to properly store meds and maintain the prescription regimen.   Cardiac Medications II: -Group verbal and written instruction to review commonly prescribed medications for heart disease. Reviews the medication, class of the drug, and side effects. (all other drug classes)    Go  Sex-Intimacy & Heart Disease, Get SMART - Goal Setting: - Group verbal and written instruction through game format to discuss heart disease and the return to sexual intimacy. Provides group verbal and written material to discuss and apply goal setting through the application of the S.M.A.R.T. Method.   Other Matters of the Heart: - Provides group verbal, written materials and models to describe Stable Angina and Peripheral Artery. Includes description of the disease process and treatment options available to the cardiac patient.   Exercise & Equipment Safety: - Individual verbal instruction and demonstration of equipment use and safety with use of the equipment.   Cardiac Rehab from 05/01/2018 in [facility] Cardiac and Pulmonary Rehab  Date  05/01/18  Educator  [facility]  Instruction Review Code  1- Verbalizes Understanding      Infection Prevention: - Provides verbal and written material to individual with discussion of infection control including proper hand washing and proper equipment cleaning during exercise session.   Cardiac Rehab from 05/01/2018 in [facility] Cardiac and Pulmonary Rehab  Date  05/01/18  Educator  [facility]  Instruction Review Code  1- Verbalizes Understanding      Falls Prevention: - Provides verbal and written material to individual with discussion of falls prevention and safety.   Cardiac Rehab from 05/01/2018 in [facility] Cardiac and Pulmonary Rehab  Date  05/01/18  Educator  [facility]  Instruction Review Code  1- Verbalizes Understanding      Diabetes: - Individual verbal and written instruction to review signs/symptoms of diabetes, desired ranges of glucose level fasting, after meals and with exercise. Acknowledge that pre and post exercise glucose checks will be done for 3 sessions at entry of program.   Cardiac Rehab from 05/01/2018 in [facility] Cardiac and Pulmonary Rehab  Date  05/01/18  Educator  [facility]  Instruction Review Code  1- Verbalizes Understanding      Know Your Numbers and Risk  Factors: -Group verbal and written instruction about important numbers in your health.  Discussion of what are risk factors and how they play a role in the disease process.  Review of Cholesterol, Blood Pressure, Diabetes, and BMI and the role they play in your overall health.   Sleep Hygiene: -Provides group verbal and written instruction about how sleep can affect your health.  Define sleep hygiene, discuss sleep cycles and impact of sleep habits. Review good sleep hygiene tips.    Other: -Provides group and verbal instruction on various topics (see comments)   Knowledge Questionnaire Score: Knowledge Questionnaire Score - 05/01/18 1152      Knowledge Questionnaire Score   Pre Score  26/28 Reviewed correct responses with Deidre Ala today. He verbalized understanding and had no further questions       Core Components/Risk Factors/Patient Goals at Admission: Personal Goals and Risk Factors at Admission - 05/01/18 1443      Core Components/Risk Factors/Patient Goals on Admission    Weight Management  Weight Maintenance;Yes    Intervention  Weight Management: Develop a combined nutrition and exercise program designed  to reach desired caloric intake, while maintaining appropriate intake of nutrient and fiber, sodium and fats, and appropriate energy expenditure required for the weight goal.;Weight Management: Provide education and appropriate resources to help participant work on and attain dietary goals.    Admit Weight  163 lb 12.8 oz (74.3 kg) Was in hospital for over 4 weeks, loos muscle mass and weight. Wants to regain muscle mass.     Goal Weight: Short Term  165 lb (74.8 kg)    Goal Weight: Long Term  170 lb (77.1 kg)    Expected Outcomes  Short Term: Continue to assess and modify interventions until short term weight is achieved;Long Term: Adherence to nutrition and physical activity/exercise program aimed toward attainment of established weight goal;Weight Maintenance: Understanding of  the daily nutrition guidelines, which includes 25-35% calories from fat, 7% or less cal from saturated fats, less than '200mg'$  cholesterol, less than 1.5gm of sodium, & 5 or more servings of fruits and vegetables daily    Diabetes  Yes    Intervention  Provide education about signs/symptoms and action to take for hypo/hyperglycemia.;Provide education about proper nutrition, including hydration, and aerobic/resistive exercise prescription along with prescribed medications to achieve blood glucose in normal ranges: Fasting glucose 65-99 mg/dL    Expected Outcomes  Short Term: Participant verbalizes understanding of the signs/symptoms and immediate care of hyper/hypoglycemia, proper foot care and importance of medication, aerobic/resistive exercise and nutrition plan for blood glucose control.;Long Term: Attainment of HbA1C < 7%.    Hypertension  Yes    Intervention  Provide education on lifestyle modifcations including regular physical activity/exercise, weight management, moderate sodium restriction and increased consumption of fresh fruit, vegetables, and low fat dairy, alcohol moderation, and smoking cessation.;Monitor prescription use compliance.    Expected Outcomes  Short Term: Continued assessment and intervention until BP is < 140/27m HG in hypertensive participants. < 130/880mHG in hypertensive participants with diabetes, heart failure or chronic kidney disease.;Long Term: Maintenance of blood pressure at goal levels.    Lipids  Yes    Intervention  Provide education and support for participant on nutrition & aerobic/resistive exercise along with prescribed medications to achieve LDL '70mg'$ , HDL >'40mg'$ .    Expected Outcomes  Short Term: Participant states understanding of desired cholesterol values and is compliant with medications prescribed. Participant is following exercise prescription and nutrition guidelines.;Long Term: Cholesterol controlled with medications as prescribed, with individualized  exercise RX and with personalized nutrition plan. Value goals: LDL < '70mg'$ , HDL > 40 mg.    Personal Goal Other  Yes    Personal Goal  Wants to get back to riding his [place] muscle mass will in the hospital for over 4 weeks    Intervention  Provide exercise prescription to work on musvcle mass gain, strength and stamina    Expected Outcomes  ST: [place] start to build his muscle strength and stamina while following his exercise prescription. LTGH:WEXHBill be able to ride his HaMarkus Daftgain without concern       Core Components/Risk Factors/Patient Goals Review:    Core Components/Risk Factors/Patient Goals at Discharge (Final Review):    ITP Comments: ITP Comments    Row Name 05/01/18 1155 05/07/18 0608         ITP Comments  Medical review complete, ITP sent to Dr M Loleta Chanceor review,changes as needed and signature.  [facility]  30 day review. Continue with ITP unless directed changes per Medical  Director  New to program         Comments:

## 2018-05-08 ENCOUNTER — Encounter: Payer: Medicare Other | Admitting: *Deleted

## 2018-05-08 DIAGNOSIS — Z952 Presence of prosthetic heart valve: Secondary | ICD-10-CM

## 2018-05-08 DIAGNOSIS — Z951 Presence of aortocoronary bypass graft: Secondary | ICD-10-CM

## 2018-05-08 DIAGNOSIS — Z48812 Encounter for surgical aftercare following surgery on the circulatory system: Secondary | ICD-10-CM | POA: Diagnosis not present

## 2018-05-08 LAB — GLUCOSE, CAPILLARY
Glucose-Capillary: 128 mg/dL — ABNORMAL HIGH (ref 65–99)
Glucose-Capillary: 122 mg/dL — ABNORMAL HIGH (ref 65–99)

## 2018-05-08 NOTE — Progress Notes (Signed)
Daily Session Note  Patient Details  Name: Harold Parker MRN: [id number] Date of Birth: 12-07-1946 Referring Provider:     Cardiac Rehab from 05/01/2018 in [facility] Cardiac and Pulmonary Rehab  Referring Provider  Arida      Encounter Date: 05/08/2018  Check In: Session Check In - 05/08/18 0931      Check-In   Location  [facility]-Cardiac & Pulmonary Rehab    Staff Present  Gerlene Burdock, RN, BSN;Meredith Sherryll Burger, RN BSN;Matalie Romberger Luan Pulling, MA, RCEP, CCRP, Exercise Physiologist    Supervising physician immediately available to respond to emergencies  See telemetry face sheet for immediately available ER MD    Medication changes reported      No    Fall or balance concerns reported     No    Warm-up and Cool-down  Performed on first and last piece of equipment    Resistance Training Performed  Yes    VAD Patient?  No      Pain Assessment   Currently in Pain?  No/denies          Social History   Tobacco Use  Smoking Status Former Smoker  . Packs/day: 1.00  . Years: 15.00  . Pack years: 15.00  . Types: Cigarettes  . Last attempt to quit: 02/02/2018  . Years since quitting: 0.2  Smokeless Tobacco Never Used  Tobacco Comment   Quit 01/2018 - on 05/01/2018 talked to Tatum about Relaspe concerns. He ststaes that he has no taste for tobacco since he quit in Feb.    Goals Met:  Exercise tolerated well Personal goals reviewed No report of cardiac concerns or symptoms Strength training completed today  Goals Unmet:  Not Applicable  Comments: First full day of exercise!  Patient was oriented to gym and equipment including functions, settings, policies, and procedures.  Patient's individual exercise prescription and treatment plan were reviewed.  All starting workloads were established based on the results of the 6 minute walk test done at initial orientation visit.  The plan for exercise progression was also introduced and progression will be customized based on patient's performance and  goals.    Dr. Emily Filbert is Medical Director for [place] and [facility].

## 2018-05-13 DIAGNOSIS — Z48812 Encounter for surgical aftercare following surgery on the circulatory system: Secondary | ICD-10-CM | POA: Diagnosis not present

## 2018-05-13 DIAGNOSIS — Z951 Presence of aortocoronary bypass graft: Secondary | ICD-10-CM

## 2018-05-13 DIAGNOSIS — Z952 Presence of prosthetic heart valve: Secondary | ICD-10-CM

## 2018-05-13 LAB — GLUCOSE, CAPILLARY
Glucose-Capillary: 143 mg/dL — ABNORMAL HIGH (ref 65–99)
Glucose-Capillary: 118 mg/dL — ABNORMAL HIGH (ref 65–99)

## 2018-05-13 NOTE — Progress Notes (Signed)
Daily Session Note  Patient Details  Name: Harold Parker MRN: [id number] Date of Birth: 05/28/1946 Referring Provider:     Cardiac Rehab from 05/01/2018 in [facility] Cardiac and Pulmonary Rehab  Referring Provider  Arida      Encounter Date: 05/13/2018  Check In: Session Check In - 05/13/18 0838      Check-In   Location  [facility]-Cardiac & Pulmonary Rehab    Staff Present  Susanne Bice, RN, BSN, CCRP;Jessica Hawkins, MA, RCEP, CCRP, Exercise Physiologist;Amanda Sommer, BA, ACSM CEP, Exercise Physiologist    Supervising physician immediately available to respond to emergencies  See telemetry face sheet for immediately available ER MD    Medication changes reported      No    Fall or balance concerns reported     No    Warm-up and Cool-down  Performed on first and last piece of equipment    Resistance Training Performed  Yes    VAD Patient?  No      Pain Assessment   Currently in Pain?  No/denies    Multiple Pain Sites  No          Social History   Tobacco Use  Smoking Status Former Smoker  . Packs/day: 1.00  . Years: 15.00  . Pack years: 15.00  . Types: Cigarettes  . Last attempt to quit: 02/02/2018  . Years since quitting: 0.2  Smokeless Tobacco Never Used  Tobacco Comment   Quit 01/2018 - on 05/01/2018 talked to Adhvik about Relaspe concerns. He ststaes that he has no taste for tobacco since he quit in Feb.    Goals Met:  Independence with exercise equipment Exercise tolerated well No report of cardiac concerns or symptoms Strength training completed today  Goals Unmet:  Not Applicable  Comments: Pt able to follow exercise prescription today without complaint.  Will continue to monitor for progression.    Dr. Mark Miller is Medical Director for [facility] and [facility]. 

## 2018-05-15 DIAGNOSIS — Z48812 Encounter for surgical aftercare following surgery on the circulatory system: Secondary | ICD-10-CM | POA: Diagnosis not present

## 2018-05-15 DIAGNOSIS — Z952 Presence of prosthetic heart valve: Secondary | ICD-10-CM

## 2018-05-15 DIAGNOSIS — Z951 Presence of aortocoronary bypass graft: Secondary | ICD-10-CM | POA: Diagnosis not present

## 2018-05-15 LAB — GLUCOSE, CAPILLARY
Glucose-Capillary: 157 mg/dL — ABNORMAL HIGH (ref 65–99)
Glucose-Capillary: 145 mg/dL — ABNORMAL HIGH (ref 65–99)

## 2018-05-15 NOTE — Progress Notes (Signed)
Daily Session Note  Patient Details  Name: Harold Parker MRN: [id number] Date of Birth: 06/21/1946 Referring Provider:     Cardiac Rehab from 05/01/2018 in [facility] Cardiac and Pulmonary Rehab  Referring Provider  Arida      Encounter Date: 05/15/2018  Check In: Session Check In - 05/15/18 1014      Check-In   Location  [facility]Cardiac [facility] Pul[facility] Re[facility]    Staff Present  Gerlene Burdock, RN, BSN;Jessica Wahneta, MA, RCEP, CCRP, Exercise Physiologist;Danine Hor Oletta Darter, [place], ACSM CEP, Exercise Physiologist    Supervising physician immediately available to respond to emergencies  See telemetry face sheet for immediately available ER MD    Medication changes reported      No    Fall or balance concerns reported     No    Warm-up and Cool-down  Performed on first and last piece of equipment    Resistance Training Performed  Yes    VAD Patient?  No      Pain Assessment   Currently in Pain?  No/denies    Multiple Pain Sites  No          Social History   Tobacco Use  Smoking Status Former Smoker  . Packs/day: 1.00  . Years: 15.00  . Pack years: 15.00  . Types: Cigarettes  . Last attempt to quit: 02/02/2018  . Years since quitting: 0.2  Smokeless Tobacco Never Used  Tobacco Comment   Quit 01/2018 - on 05/01/2018 talked to Sena about Relaspe concerns. He ststaes that he has no taste for tobacco since he quit in Feb.    Goals Met:  Independence with exercise equipment Exercise tolerated well No report of cardiac concerns or symptoms Strength training completed today  Goals Unmet:  Not Applicable  Comments: Pt able to follow exercise prescription today without complaint.  Will continue to monitor for progression.    Dr. Emily Filbert is Medical Director for [place] and [facility].

## 2018-05-20 ENCOUNTER — Encounter: Payer: Medicare Other | Attending: Cardiovascular Disease

## 2018-05-20 DIAGNOSIS — Z48812 Encounter for surgical aftercare following surgery on the circulatory system: Secondary | ICD-10-CM | POA: Insufficient documentation

## 2018-05-20 DIAGNOSIS — Z952 Presence of prosthetic heart valve: Secondary | ICD-10-CM | POA: Insufficient documentation

## 2018-05-20 DIAGNOSIS — Z951 Presence of aortocoronary bypass graft: Secondary | ICD-10-CM | POA: Insufficient documentation

## 2018-05-20 NOTE — Progress Notes (Signed)
This encounter was created in error - please disregard.

## 2018-05-20 NOTE — Progress Notes (Deleted)
Daily Session Note  Patient Details  Name: Harold Parker MRN: [id number] Date of Birth: 09/25/1946 Referring Provider:     Cardiac Rehab from 05/01/2018 in [facility] Cardiac and Pulmonary Rehab  Referring Provider  Arida      Encounter Date: 05/20/2018  Check In: Session Check In - 05/20/18 0854      Check-In   Location  [facility]-Cardiac & Pulmonary Rehab    Staff Present  Justin Mend RCP,RRT,BSRT;Heath Lark, RN, BSN, Lance Sell, BA, ACSM CEP, Exercise Physiologist    Supervising physician immediately available to respond to emergencies  See telemetry face sheet for immediately available ER MD    Medication changes reported      No    Fall or balance concerns reported     No    Tobacco Cessation  No Change    Warm-up and Cool-down  Performed on first and last piece of equipment    Resistance Training Performed  Yes    VAD Patient?  No      Pain Assessment   Currently in Pain?  No/denies          Social History   Tobacco Use  Smoking Status Former Smoker  . Packs/day: 1.00  . Years: 15.00  . Pack years: 15.00  . Types: Cigarettes  . Last attempt to quit: 02/02/2018  . Years since quitting: 0.2  Smokeless Tobacco Never Used  Tobacco Comment   Quit 01/2018 - on 05/01/2018 talked to Mel about Relaspe concerns. He ststaes that he has no taste for tobacco since he quit in Feb.    Goals Met:  Independence with exercise equipment Exercise tolerated well No report of cardiac concerns or symptoms Strength training completed today  Goals Unmet:  Not Applicable  Comments: Pt able to follow exercise prescription today without complaint.  Will continue to monitor for progression.   Dr. Emily Filbert is Medical Director for [place] and [facility].

## 2018-05-24 ENCOUNTER — Other Ambulatory Visit: Payer: Self-pay | Admitting: Cardiothoracic Surgery

## 2018-05-27 ENCOUNTER — Encounter: Payer: Self-pay | Admitting: *Deleted

## 2018-05-27 DIAGNOSIS — Z951 Presence of aortocoronary bypass graft: Secondary | ICD-10-CM

## 2018-05-27 DIAGNOSIS — Z952 Presence of prosthetic heart valve: Secondary | ICD-10-CM

## 2018-05-29 DIAGNOSIS — Z951 Presence of aortocoronary bypass graft: Secondary | ICD-10-CM | POA: Diagnosis not present

## 2018-05-29 DIAGNOSIS — Z48812 Encounter for surgical aftercare following surgery on the circulatory system: Secondary | ICD-10-CM | POA: Diagnosis not present

## 2018-05-29 DIAGNOSIS — Z952 Presence of prosthetic heart valve: Secondary | ICD-10-CM

## 2018-05-29 NOTE — Progress Notes (Signed)
Daily Session Note  Patient Details  Name: Marston Mccadden MRN: [id number] Date of Birth: [birth year] Referring Provider:     Cardiac Rehab from 05/01/2018 in [facility] Cardiac and Pulmonary Rehab  Referring Provider  Arida      Encounter Date: 05/29/2018  Check In: Session Check In - 05/29/18 0837      Check-In   Location  [facility]-Cardiac & Pulmonary Rehab    Staff Present  Gerlene Burdock, RN, BSN;Daeron Carreno Luan Pulling, MA, RCEP, CCRP, Exercise Physiologist;Amanda Oletta Darter, [place], ACSM CEP, Exercise Physiologist    Supervising physician immediately available to respond to emergencies  See telemetry face sheet for immediately available ER MD    Medication changes reported      No    Fall or balance concerns reported     No    Warm-up and Cool-down  Performed on first and last piece of equipment    Resistance Training Performed  Yes    VAD Patient?  No      Pain Assessment   Currently in Pain?  No/denies          Social History   Tobacco Use  Smoking Status Former Smoker  . Packs/day: 1.00  . Years: 15.00  . Pack years: 15.00  . Types: Cigarettes  . Last attempt to quit: 02/02/2018  . Years since quitting: 0.3  Smokeless Tobacco Never Used  Tobacco Comment   Quit 01/2018 - on 05/01/2018 talked to Sigurd about Relaspe concerns. He ststaes that he has no taste for tobacco since he quit in Feb.    Goals Met:  Independence with exercise equipment Exercise tolerated well No report of cardiac concerns or symptoms Strength training completed today  Goals Unmet:  Not Applicable  Comments: Pt able to follow exercise prescription today without complaint.  Will continue to monitor for progression.    Dr. Emily Filbert is Medical Director for [place] and [facility].

## 2018-06-03 DIAGNOSIS — Z951 Presence of aortocoronary bypass graft: Secondary | ICD-10-CM

## 2018-06-03 DIAGNOSIS — Z952 Presence of prosthetic heart valve: Secondary | ICD-10-CM | POA: Diagnosis not present

## 2018-06-03 DIAGNOSIS — Z48812 Encounter for surgical aftercare following surgery on the circulatory system: Secondary | ICD-10-CM | POA: Diagnosis not present

## 2018-06-03 NOTE — Progress Notes (Signed)
Daily Session Note  Patient Details  Name: Harold Parker MRN: [id number] Date of Birth: 13-Apr-1946 Referring Provider:     Cardiac Rehab from 05/01/2018 in [facility] Cardiac and Pulmonary Rehab  Referring Provider  Arida      Encounter Date: 06/03/2018  Check In: Session Check In - 06/03/18 0846      Check-In   Staff Present  Heath Lark, RN, BSN, CCRP;Jessica Hawkins, MA, RCEP, CCRP, Exercise Physiologist;Mandi Ballard, BS, [place];Nada Maclachlan, BA, ACSM CEP, Exercise Physiologist    Supervising physician immediately available to respond to emergencies  See telemetry face sheet for immediately available ER MD    Medication changes reported      No    Fall or balance concerns reported     No    Warm-up and Cool-down  Performed on first and last piece of equipment    Resistance Training Performed  Yes    VAD Patient?  No      Pain Assessment   Currently in Pain?  No/denies    Multiple Pain Sites  No          Social History   Tobacco Use  Smoking Status Former Smoker  . Packs/day: 1.00  . Years: 15.00  . Pack years: 15.00  . Types: Cigarettes  . Last attempt to quit: 02/02/2018  . Years since quitting: 0.3  Smokeless Tobacco Never Used  Tobacco Comment   Quit 01/2018 - on 05/01/2018 talked to Dara about Relaspe concerns. He ststaes that he has no taste for tobacco since he quit in Feb.    Goals Met:  Independence with exercise equipment Exercise tolerated well No report of cardiac concerns or symptoms Strength training completed today  Goals Unmet:  Not Applicable  Comments: Reviewed home exercise with pt today.  Pt plans to walk outside and use DB for exercise.  Reviewed THR, pulse, RPE, sign and symptoms, NTG use, and when to call 911 or MD.  Also discussed weather considerations and indoor options.  Pt voiced understanding.    Dr. Emily Filbert is Medical Director for [place] and [facility].

## 2018-06-04 ENCOUNTER — Encounter: Payer: Self-pay | Admitting: *Deleted

## 2018-06-04 DIAGNOSIS — Z952 Presence of prosthetic heart valve: Secondary | ICD-10-CM

## 2018-06-04 DIAGNOSIS — Z951 Presence of aortocoronary bypass graft: Secondary | ICD-10-CM

## 2018-06-04 NOTE — Progress Notes (Signed)
Cardiac Individual Treatment Plan  Patient Details  Name: Harold Parker MRN: [id number] Date of Birth: [birth year] Referring Provider:     Cardiac Rehab from 05/01/2018 in [facility] Cardiac and Pulmonary Rehab  Referring Provider  Arida      Initial Encounter Date:    Cardiac Rehab from 05/01/2018 in [facility] Cardiac and Pulmonary Rehab  Date  05/01/18  Referring Provider  Fletcher Anon      Visit Diagnosis: S/P CABG x 3  S/P aortic valve replacement  Patient's Home Medications on Admission:  Current Outpatient Medications:  .  ALPRAZolam (XANAX) 0.5 MG tablet, Take 0.5 mg by mouth at bedtime as needed for anxiety., Disp: , Rfl:  .  Ascorbic Acid (VITAMIN C) 1000 MG tablet, Take 1,000 mg by mouth daily., Disp: , Rfl:  .  aspirin EC 81 MG tablet, Take 81 mg by mouth daily., Disp: , Rfl:  .  atorvastatin (LIPITOR) 80 MG tablet, TAKE 1 TABLET BY MOUTH DAILY AT 6 PM, Disp: 30 tablet, Rfl: 0 .  clopidogrel (PLAVIX) 75 MG tablet, TAKE 1 TABLET BY MOUTH DAILY (Patient not taking: Reported on 05/01/2018), Disp: 30 tablet, Rfl: 0 .  ezetimibe (ZETIA) 10 MG tablet, Take 10 mg by mouth daily., Disp: , Rfl:  .  folic acid (FOLVITE) 048 MCG tablet, Take 400 mcg by mouth every evening., Disp: , Rfl:  .  furosemide (LASIX) 40 MG tablet, Take 1 tablet (40 mg) by mouth once daily, Disp: 90 tablet, Rfl: 3 .  glipiZIDE (GLUCOTROL) 5 MG tablet, Take 0.5 tablets (2.5 mg total) by mouth daily before breakfast., Disp: 30 tablet, Rfl: 1 .  ipratropium-albuterol (DUONEB) 0.5-2.5 (3) MG/3ML SOLN, Take 3 mLs by nebulization every 6 (six) hours as needed., Disp: 360 mL, Rfl: 1 .  losartan (COZAAR) 100 MG tablet, Take 1 tablet (100 mg total) by mouth daily., Disp: 90 tablet, Rfl: 3 .  metFORMIN (GLUCOPHAGE) 1000 MG tablet, TAKE 1 TABLET BY MOUTH TWICE DAILY WITH A MEAL, Disp: 60 tablet, Rfl: 0 .  metoprolol tartrate (LOPRESSOR) 50 MG tablet, TAKE 1 TABLET BY MOUTH TWICE DAILY, Disp: 60 tablet, Rfl: 0 .  pantoprazole (PROTONIX)  40 MG tablet, Take 1 tablet (40 mg total) by mouth daily., Disp: 30 tablet, Rfl: 0 .  traMADol (ULTRAM) 50 MG tablet, TAKE 1 TABLET BY MOUTH EVERY 6 HOURS AS NEEDED, Disp: 40 tablet, Rfl: 0 .  Vitamin D, Cholecalciferol, 1000 units TABS, Take 1 tablet by mouth every morning. , Disp: , Rfl:   Past Medical History: Past Medical History:  Diagnosis Date  . Bilateral carotid bruits    a. 01/2018 U/S: < 50% bilat ICA stenosis.  Marland Kitchen CAD (coronary artery disease)    a. 1998 s/p MI and BMS [facility], [place]); b. 1999 redo PCI/rotablator in setting of what sounds like ISR;  c. Multiple stress tests over the years - last ~ 2017, reportedly nl; d. 01/2018 NSTEMI/Cath: LM 75md, LAD 50p, 40p/m, D1 60ost, OM1 95, RCA 100ost/p w/ L->R collats, EF 45%; e. s/p 3V CABG 02/19/18 (LIMA-LAD, VG-D1, VG-OM)  . Chronic lower back pain   . COPD (chronic obstructive pulmonary disease) ([place]   . GIB (gastrointestinal bleeding)    a. 02/2018 GIB and anemia w/ Hgb of 4.7 on presentation; b. 03/2018 EGD: 2 nonbleeding duodenal ulcers.  .Marland KitchenHTN (hypertension)   . Hypercholesteremia   . Ischemic cardiomyopathy    a. 01/2018 Echo: EF 40-45%, mid-apicalanteroseptal, ant, apical sev HK, mod apicalinferior HK. Gr2 DD. Mod AS, mild  MR, mod dil LA, PASP 70mHg.  . Moderate aortic stenosis    a. 01/2018 Echo: Mod AS, mean grad (S) 269mg, Valve area (VTI) 1.06 cm^2, (Vmax) 1.27 cm^2; b. 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  . [place]  . 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 02/19/2018 AVR: 25 mm Edwards Inspiris Resilia stented bovine pericardial tissue valve  . 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 Tobacco abuse   . Type II diabetes mellitus (HCC)     Tobacco Use: Social History   Tobacco Use  Smoking Status Former Smoker  . Packs/day: 1.00  . Years: 15.00  . Pack years: 15.00  . Types: Cigarettes  . Last attempt to  quit: 02/02/2018  . Years since quitting: 0.3  Smokeless Tobacco Never Used  Tobacco Comment   Quit 01/2018 - on 05/01/2018 talked to RaYoshiobout Relaspe concerns. He ststaes that he has no taste for tobacco since he quit in Feb.    Labs: Recent Review Flowsheet Data    Labs for ITP Cardiac and Pulmonary Rehab Latest Ref Rng & Units 02/19/2018 02/19/2018 02/20/2018 02/20/2018 03/27/2018   Cholestrol 100 - 199 mg/dL - - - - 102   LDLCALC 0 - 99 mg/dL - - - - 50   HDL >39 mg/dL - - - - 35(L)   Trlycerides 0 - 149 mg/dL - - - - 84   Hemoglobin A1c 4.8 - 5.6 % - - - - -   PHART 7.350 - 7.450 - 7.335(L) 7.361 7.331(L) -   PCO2ART 32.0 - 48.0 mmHg - 40.2 40.1 41.0 -   HCO3 20.0 - 28.0 mmol/L - 21.2 22.5 21.5 -   TCO2 22 - 32 mmol/L _0 -   ACIDBASEDEF 0.0 - 2.0 mmol/L - 4.0(H) 2.0 4.0(H) -   O2SAT % - 97.0 97.0 95.0 -       Exercise Target Goals:    Exercise Program Goal: Individual exercise prescription set using results from initial 6 min walk test and THRR while considering  patient's activity barriers and safety.   Exercise Prescription Goal: Initial exercise prescription builds to 30-45 minutes a day of aerobic activity, 2-3 days per week.  Home exercise guidelines will be given to patient during program as part of exercise prescription that the participant will acknowledge.  Activity Barriers & Risk Stratification: Activity Barriers & Cardiac Risk Stratification - 05/01/18 1450      Activity Barriers & Cardiac Risk Stratification   Activity Barriers  Back Problems HIstory of back injury years ago.  He has lidocaine patches and epidurals as needed       6 Minute Walk: 6 Minute Walk    Row Name 05/01/18 1406         6 Minute Walk   Distance  888 feet     Walk Time  6 minutes     # of Rest Breaks  0     MPH  1.68     METS  2.04     RPE  11     Perceived Dyspnea   0     VO2 Peak  7.17     Symptoms  No     Resting HR  78 bpm     Resting BP  134/64     Resting Oxygen  Saturation   94 %  Exercise Oxygen Saturation  during 6 min walk  94 %     Max Ex. HR  110 bpm     Max Ex. BP  126/60     2 Minute Post BP  116/68        Oxygen Initial Assessment:   Oxygen Re-Evaluation:   Oxygen Discharge (Final Oxygen Re-Evaluation):   Initial Exercise Prescription: Initial Exercise Prescription - 05/01/18 1400      Date of Initial Exercise RX and Referring Provider   Date  05/01/18    Referring Provider  Arida      Treadmill   MPH  1.5    Grade  0    Minutes  15    METs  2.15      Recumbant Bike   Level  1    RPM  60    Watts  16    Minutes  15    METs  2      NuStep   Level  2    SPM  80    Minutes  15    METs  2      Prescription Details   Frequency (times per week)  2    Duration  Progress to 30 minutes of continuous aerobic without signs/symptoms of physical distress      Intensity   THRR 40-80% of Max Heartrate  105-133    Ratings of Perceived Exertion  11-13    Perceived Dyspnea  0-4      Resistance Training   Training Prescription  Yes    Weight  3 lb    Reps  10-15       Perform Capillary Blood Glucose checks as needed.  Exercise Prescription Changes: Exercise Prescription Changes    Row Name 05/01/18 1400 05/13/18 1500 06/03/18 0900         Response to Exercise   Blood Pressure (Admit)  134/64  132/64  132/64     Blood Pressure (Exercise)  126/60  128/60  128/60     Blood Pressure (Exit)  116/68  128/60  128/60     Heart Rate (Admit)  72 bpm  88 bpm  88 bpm     Heart Rate (Exercise)  93 bpm  94 bpm  94 bpm     Heart Rate (Exit)  63 bpm  90 bpm  90 bpm     Oxygen Saturation (Admit)  94 %  -  -     Rating of Perceived Exertion (Exercise)  _0 Symptoms  -  none  none     Comments  -  second full day of exercise  second full day of exercise     Duration  -  Continue with 30 min of aerobic exercise without signs/symptoms of physical distress.  Continue with 30 min of aerobic exercise without  signs/symptoms of physical distress.     Intensity  -  THRR unchanged  THRR unchanged       Progression   Progression  -  Continue to progress workloads to maintain intensity without signs/symptoms of physical distress.  Continue to progress workloads to maintain intensity without signs/symptoms of physical distress.     Average METs  -  2.4  2.4       Resistance Training   Training Prescription  -  Yes  Yes     Weight  -  3 lbs  3 lbs     Reps  -  10-15  10-15       Interval Training   Interval Training  -  No  No       NuStep   Level  -  2  2     Minutes  -  15  15     METs  -  3  3       Arm Ergometer   Level  -  1  1     Minutes  -  15  15     METs  -  1.8  1.8       Home Exercise Plan   Plans to continue exercise at  -  -  Home (comment)     Frequency  -  -  Add 2 additional days to program exercise sessions.     Initial Home Exercises Provided  -  -  06/03/18        Exercise Comments: Exercise Comments    Row Name 05/08/18 0932 06/03/18 0942         Exercise Comments  First full day of exercise!  Patient was oriented to gym and equipment including functions, settings, policies, and procedures.  Patient's individual exercise prescription and treatment plan were reviewed.  All starting workloads were established based on the results of the 6 minute walk test done at initial orientation visit.  The plan for exercise progression was also introduced and progression will be customized based on patient's performance and goals.  Reviewed home exercise with pt today.  Pt plans to walk outside and use DB for exercise.  Reviewed THR, pulse, RPE, sign and symptoms, NTG use, and when to call 911 or MD.  Also discussed weather considerations and indoor options.  Pt voiced understanding.         Exercise Goals and Review: Exercise Goals    Row Name 05/01/18 1405             Exercise Goals   Increase Physical Activity  Yes       Intervention  Provide advice, education,  support and counseling about physical activity/exercise needs.;Develop an individualized exercise prescription for aerobic and resistive training based on initial evaluation findings, risk stratification, comorbidities and participant's personal goals.       Expected Outcomes  Short Term: Attend rehab on a regular basis to increase amount of physical activity.;Long Term: Add in home exercise to make exercise part of routine and to increase amount of physical activity.;Long Term: Exercising regularly at least 3-5 days a week.       Increase Strength and Stamina  Yes       Intervention  Provide advice, education, support and counseling about physical activity/exercise needs.;Develop an individualized exercise prescription for aerobic and resistive training based on initial evaluation findings, risk stratification, comorbidities and participant's personal goals.       Expected Outcomes  Short Term: Increase workloads from initial exercise prescription for resistance, speed, and METs.;Short Term: Perform resistance training exercises routinely during rehab and add in resistance training at home;Long Term: Improve cardiorespiratory fitness, muscular endurance and strength as measured by increased METs and functional capacity (6MWT)       Able to understand and use rate of perceived exertion (RPE) scale  Yes       Intervention  Provide education and explanation on how to use RPE scale       Expected Outcomes  Short Term: Able to use RPE daily in rehab to express subjective intensity level;Long Term:  Able to use RPE to guide intensity level when exercising independently       Able to understand and use Dyspnea scale  Yes       Intervention  Provide education and explanation on how to use Dyspnea scale       Expected Outcomes  Short Term: Able to use Dyspnea scale daily in rehab to express subjective sense of shortness of breath during exertion;Long Term: Able to use Dyspnea scale to guide intensity level when  exercising independently       Knowledge and understanding of Target Heart Rate Range (THRR)  Yes       Intervention  Provide education and explanation of THRR including how the numbers were predicted and where they are located for reference       Expected Outcomes  Short Term: Able to state/look up THRR;Short Term: Able to use daily as guideline for intensity in rehab;Long Term: Able to use THRR to govern intensity when exercising independently       Able to check pulse independently  Yes       Intervention  Provide education and demonstration on how to check pulse in carotid and radial arteries.;Review the importance of being able to check your own pulse for safety during independent exercise       Expected Outcomes  Short Term: Able to explain why pulse checking is important during independent exercise;Long Term: Able to check pulse independently and accurately       Understanding of Exercise Prescription  Yes       Intervention  Provide education, explanation, and written materials on patient's individual exercise prescription       Expected Outcomes  Short Term: Able to explain program exercise prescription;Long Term: Able to explain home exercise prescription to exercise independently          Exercise Goals Re-Evaluation : Exercise Goals Re-Evaluation    Row Name 05/08/18 0932 05/13/18 1511 05/27/18 1458 05/29/18 0946 06/03/18 0942     Exercise Goal Re-Evaluation   Exercise Goals Review  Increase Physical Activity;Able to understand and use rate of perceived exertion (RPE) scale;Understanding of Exercise Prescription;Knowledge and understanding of Target Heart Rate Range (THRR)  Increase Physical Activity;Understanding of Exercise Prescription;Increase Strength and Stamina  -  Increase Physical Activity;Understanding of Exercise Prescription;Increase Strength and Stamina  Increase Physical Activity;Increase Strength and Stamina;Able to understand and use rate of perceived exertion (RPE)  scale;Knowledge and understanding of Target Heart Rate Range (THRR);Able to check pulse independently;Understanding of Exercise Prescription   Comments  Reviewed RPE scale, THR and program prescription with pt today.  Pt voiced understanding and was given a copy of goals to take home.   Gilles is off to a good start in rehab.  He is already getting 3 METs on the NuStep.  We will start to increase his workloads and continue to monitor his progress.   Out since last review  Early returned to exercise today after a trip home.  He continues to feel good.  His ultimate goal is to get back on his bike.  He does feel like he is starting to get his strength back and should be able to handle his bike once cleared by surgeon to ride.    He does get is some exercise at home on his off but has not really been tracking it much.  We will review home exercise guidelines soon.   Reviewed home exercise with pt today.  Pt plans to walk outside and use DB  for exercise.  Reviewed THR, pulse, RPE, sign and symptoms, NTG use, and when to call 911 or MD.  Also discussed weather considerations and indoor options.  Pt voiced understanding.   Expected Outcomes  Short: Use RPE daily to regulate intensity.  Long: Follow program prescription in THR.  Short: Increase workloads and review home exercise.  Long: Continue to work on Education administrator.   -  Short: Review home exercise guidelines.  Long: Continue to build strength to ride bike.   Short - continue to exercise on days not at [facility] Long - maintain exercise after completing HT      Discharge Exercise Prescription (Final Exercise Prescription Changes): Exercise Prescription Changes - 06/03/18 0900      Response to Exercise   Blood Pressure (Admit)  132/64    Blood Pressure (Exercise)  128/60    Blood Pressure (Exit)  128/60    Heart Rate (Admit)  88 bpm    Heart Rate (Exercise)  94 bpm    Heart Rate (Exit)  90 bpm    Rating of Perceived Exertion (Exercise)  12     Symptoms  none    Comments  second full day of exercise    Duration  Continue with 30 min of aerobic exercise without signs/symptoms of physical distress.    Intensity  THRR unchanged      Progression   Progression  Continue to progress workloads to maintain intensity without signs/symptoms of physical distress.    Average METs  2.4      Resistance Training   Training Prescription  Yes    Weight  3 lbs    Reps  10-15      Interval Training   Interval Training  No      NuStep   Level  2    Minutes  15    METs  3      Arm Ergometer   Level  1    Minutes  15    METs  1.8      Home Exercise Plan   Plans to continue exercise at  Home (comment)    Frequency  Add 2 additional days to program exercise sessions.    Initial Home Exercises Provided  06/03/18       Nutrition:  Target Goals: Understanding of nutrition guidelines, daily intake of sodium <1518m, cholesterol <2069m calories 30% from fat and 7% or less from saturated fats, daily to have 5 or more servings of fruits and vegetables.  Biometrics: Pre Biometrics - 05/01/18 1404      Pre Biometrics   Height  5' 5" (1.651 m)    Weight  163 lb 12.8 oz (74.3 kg)    Waist Circumference  38.5 inches    Hip Circumference  38.5 inches    Waist to Hip Ratio  1 %    BMI (Calculated)  27.26    Single Leg Stand  27.92 seconds        Nutrition Therapy Plan and Nutrition Goals: Nutrition Therapy & Goals - 05/01/18 1443      Intervention Plan   Intervention  Prescribe, educate and counsel regarding individualized specific dietary modifications aiming towards targeted core components such as weight, hypertension, lipid management, diabetes, heart failure and other comorbidities.    Expected Outcomes  Short Term Goal: Understand basic principles of dietary content, such as calories, fat, sodium, cholesterol and nutrients.;Short Term Goal: A plan has been developed with personal nutrition goals set during dietitian  appointment.;Long Term Goal: Adherence to prescribed nutrition plan.       Nutrition Assessments: Nutrition Assessments - 05/01/18 1155      MEDFICTS Scores   Pre Score  34       Nutrition Goals Re-Evaluation: Nutrition Goals Re-Evaluation    Row Name 05/29/18 0951             Goals   Current Weight  166 lb (75.3 kg)       Nutrition Goal  Meet with dietician       Comment  Akshat would like to work on gaining muscle weight and staying lean.  He has a pretty good diet.  We made him an appointment to meet with dietician after class next Thursday (6/20).         Expected Outcome  Short: Meet with dietician.  Long: Continue to follow recommendations to gain muscle weight          Nutrition Goals Discharge (Final Nutrition Goals Re-Evaluation): Nutrition Goals Re-Evaluation - 05/29/18 0951      Goals   Current Weight  166 lb (75.3 kg)    Nutrition Goal  Meet with dietician    Comment  Lacey would like to work on gaining muscle weight and staying lean.  He has a pretty good diet.  We made him an appointment to meet with dietician after class next Thursday (6/20).      Expected Outcome  Short: Meet with dietician.  Long: Continue to follow recommendations to gain muscle weight       Psychosocial: Target Goals: Acknowledge presence or absence of significant depression and/or stress, maximize coping skills, provide positive support system. Participant is able to verbalize types and ability to use techniques and skills needed for reducing stress and depression.   Initial Review & Psychosocial Screening: Initial Psych Review & Screening - 05/01/18 1448      Initial Review   Current issues with  None Identified      Family Dynamics   Good Support System?  -- Significant other  Family and Friends      Barriers   Psychosocial barriers to participate in program  The patient should benefit from training in stress management and relaxation.;There are no identifiable barriers or  psychosocial needs.      Screening Interventions   Interventions  Encouraged to exercise;To provide support and resources with identified psychosocial needs;Provide feedback about the scores to participant    Expected Outcomes  Short Term goal: Utilizing psychosocial counselor, staff and physician to assist with identification of specific Stressors or current issues interfering with healing process. Setting desired goal for each stressor or current issue identified.;Long Term Goal: Stressors or current issues are controlled or eliminated.;Short Term goal: Identification and review with participant of any Quality of Life or Depression concerns found by scoring the questionnaire.;Long Term goal: The participant improves quality of Life and PHQ9 Scores as seen by post scores and/or verbalization of changes       Quality of Life Scores:  Quality of Life - 05/01/18 1153      Quality of Life Scores   Health/Function Pre  27.83 %    Socioeconomic Pre  27.86 %    Psych/Spiritual Pre  30 %    Family Pre  30 %    GLOBAL Pre  28.6 %      Scores of 19 and below usually indicate a poorer quality of life in these areas.  A difference of  2-3 points is a  clinically meaningful difference.  A difference of 2-3 points in the total score of the Quality of Life Index has been associated with significant improvement in overall quality of life, self-image, physical symptoms, and general health in studies assessing change in quality of life.  PHQ-9: Recent Review Flowsheet Data    Depression screen [facility] 2/9 05/01/2018   Decreased Interest 0   Down, Depressed, Hopeless 0   PHQ - 2 Score 0   Altered sleeping 0   Tired, decreased energy 0   Change in appetite 0   Feeling bad or failure about yourself  0   Trouble concentrating 0   Moving slowly or fidgety/restless 0   Suicidal thoughts 0   PHQ-9 Score 0   Difficult doing work/chores Not difficult at all     Interpretation of Total Score  Total Score  Depression Severity:  1-4 = Minimal depression, 5-9 = Mild depression, 10-14 = Moderate depression, 15-19 = Moderately severe depression, 20-27 = Severe depression   Psychosocial Evaluation and Intervention: Psychosocial Evaluation - 05/08/18 0934      Psychosocial Evaluation & Interventions   Interventions  Encouraged to exercise with the program and follow exercise prescription    Comments  Counselor met with Mr. Severns Vandyke) today for initial psychosocial evaluation.  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  Barnie has a strong support system with a fiance and (3) daughters he speaks with daily as the daughters are in [place].  Delta reports sleeping better since his surgery and having a good appetite.  He denies a history of depression or anxiety other than some symptoms when his spouse passed 4 years ago; he denies any current symptoms.  Leanard states he is typically positive and has minimal stress in his life.  He has goals to be healthy enough to get back on his motorcycle and drive to [place] to visit family.  Staff will follow with Deidre Ala.    Expected Outcomes  Short:  Dhruv will exercise consistently to increase his stamin and strength.   Long:  Quindarrius will be strong enough to ride his motorcycle to [place] to visit family.     Continue Psychosocial Services   Follow up required by staff       Psychosocial Re-Evaluation: Psychosocial Re-Evaluation    Warner Name 05/29/18 [phone]             Psychosocial Re-Evaluation   Current issues with  None Identified       Comments  Diante has been doing well in rehab.  He has minimal stress in his life.  He really wants to get back on his bike.  He has an appointment with Dr. Roxy Manns on 6/24 to talk about this.  He feels that he has regained enough strength to ride again. After his trip, he also now has his own car back which makes him happier to have the freedom back.  He sleeps well and gets about 6 hours of sleep a night.       Expected Outcomes  Short:  Talk to doctor about getting back on bike.  Long: Continue to stay postive.           Psychosocial Discharge (Final Psychosocial Re-Evaluation): Psychosocial Re-Evaluation - 05/29/18 0952      Psychosocial Re-Evaluation   Current issues with  None Identified    Comments  Sircharles has been doing well in rehab.  He has minimal stress in his life.  He really wants  to get back on his bike.  He has an appointment with Dr. Roxy Manns on 6/24 to talk about this.  He feels that he has regained enough strength to ride again. After his trip, he also now has his own car back which makes him happier to have the freedom back.  He sleeps well and gets about 6 hours of sleep a night.    Expected Outcomes  Short: Talk to doctor about getting back on bike.  Long: Continue to stay postive.        Vocational Rehabilitation: Provide vocational rehab assistance to qualifying candidates.   Vocational Rehab Evaluation & Intervention: Vocational Rehab - 05/01/18 1450      Initial Vocational Rehab Evaluation & Intervention   Assessment shows need for Vocational Rehabilitation  No       Education: Education Goals: Education classes will be provided on a variety of topics geared toward better understanding of heart health and risk factor modification. Participant will state understanding/return demonstration of topics presented as noted by education test scores.  Learning Barriers/Preferences: Learning Barriers/Preferences - 05/01/18 1449      Learning Barriers/Preferences   Learning Barriers  None    Learning Preferences  None       Education Topics:  AED/CPR: - Group verbal and written instruction with the use of models to demonstrate the basic use of the AED with the basic ABC's of resuscitation.   Cardiac Rehab from 06/03/2018 in [facility] Cardiac and Pulmonary Rehab  Date  05/08/18  Educator  CE  Instruction Review Code  1- Verbalizes Understanding      General Nutrition Guidelines/Fats and Fiber: -Group  instruction provided by verbal, written material, models and posters to present the general guidelines for heart healthy nutrition. Gives an explanation and review of dietary fats and fiber.   Controlling Sodium/Reading Food Labels: -Group verbal and written material supporting the discussion of sodium use in heart healthy nutrition. Review and explanation with models, verbal and written materials for utilization of the food label.   Cardiac Rehab from 06/03/2018 in [facility] Cardiac and Pulmonary Rehab  Date  05/13/18  Educator  PI  Instruction Review Code  1- Verbalizes Understanding      Exercise Physiology & General Exercise Guidelines: - Group verbal and written instruction with models to review the exercise physiology of the cardiovascular system and associated critical values. Provides general exercise guidelines with specific guidelines to those with heart or lung disease.    Cardiac Rehab from 06/03/2018 in [facility] Cardiac and Pulmonary Rehab  Date  05/20/18  Educator  AS  Instruction Review Code  4- No Evidence of Learning [charting in error]      Aerobic Exercise & Resistance Training: - Gives group verbal and written instruction on the various components of exercise. Focuses on aerobic and resistive training programs and the benefits of this training and how to safely progress through these programs..   Cardiac Rehab from 06/03/2018 in [facility] Cardiac and Pulmonary Rehab  Date  06/03/18  Educator  [facility]  Instruction Review Code  1- Verbalizes Understanding      Flexibility, Balance, Mind/Body Relaxation: Provides group verbal/written instruction on the benefits of flexibility and balance training, including mind/body exercise modes such as yoga, pilates and tai chi.  Demonstration and skill practice provided.   Stress and Anxiety: - Provides group verbal and written instruction about the health risks of elevated stress and causes of high stress.  Discuss the correlation between  heart/lung disease and anxiety and treatment  options. Review healthy ways to manage with stress and anxiety.   Depression: - Provides group verbal and written instruction on the correlation between heart/lung disease and depressed mood, treatment options, and the stigmas associated with seeking treatment.   Anatomy & Physiology of the Heart: - Group verbal and written instruction and models provide basic cardiac anatomy and physiology, with the coronary electrical and arterial systems. Review of Valvular disease and Heart Failure   Cardiac Procedures: - Group verbal and written instruction to review commonly prescribed medications for heart disease. Reviews the medication, class of the drug, and side effects. Includes the steps to properly store meds and maintain the prescription regimen. (beta blockers and nitrates)   Cardiac Medications I: - Group verbal and written instruction to review commonly prescribed medications for heart disease. Reviews the medication, class of the drug, and side effects. Includes the steps to properly store meds and maintain the prescription regimen.   Cardiac Medications II: -Group verbal and written instruction to review commonly prescribed medications for heart disease. Reviews the medication, class of the drug, and side effects. (all other drug classes)   Cardiac Rehab from 06/03/2018 in [facility] Cardiac and Pulmonary Rehab  Date  05/29/18  Educator  CE  Instruction Review Code  1- Verbalizes Understanding       Go Sex-Intimacy & Heart Disease, Get SMART - Goal Setting: - Group verbal and written instruction through game format to discuss heart disease and the return to sexual intimacy. Provides group verbal and written material to discuss and apply goal setting through the application of the S.M.A.R.T. Method.   Other Matters of the Heart: - Provides group verbal, written materials and models to describe Stable Angina and Peripheral Artery. Includes  description of the disease process and treatment options available to the cardiac patient.   Exercise & Equipment Safety: - Individual verbal instruction and demonstration of equipment use and safety with use of the equipment.   Cardiac Rehab from 06/03/2018 in [facility] Cardiac and Pulmonary Rehab  Date  05/01/18  Educator  [facility]  Instruction Review Code  1- Verbalizes Understanding      Infection Prevention: - Provides verbal and written material to individual with discussion of infection control including proper hand washing and proper equipment cleaning during exercise session.   Cardiac Rehab from 06/03/2018 in [facility] Cardiac and Pulmonary Rehab  Date  05/01/18  Educator  mc  Instruction Review Code  1- Verbalizes Understanding      Falls Prevention: - Provides verbal and written material to individual with discussion of falls prevention and safety.   Cardiac Rehab from 06/03/2018 in [facility] Cardiac and Pulmonary Rehab  Date  05/01/18  Educator  mc  Instruction Review Code  1- Verbalizes Understanding      Diabetes: - Individual verbal and written instruction to review signs/symptoms of diabetes, desired ranges of glucose level fasting, after meals and with exercise. Acknowledge that pre and post exercise glucose checks will be done for 3 sessions at entry of program.   Cardiac Rehab from 06/03/2018 in [facility] Cardiac and Pulmonary Rehab  Date  05/01/18  Educator  [facility]  Instruction Review Code  1- Verbalizes Understanding      Know Your Numbers and Risk Factors: -Group verbal and written instruction about important numbers in your health.  Discussion of what are risk factors and how they play a role in the disease process.  Review of Cholesterol, Blood Pressure, Diabetes, and BMI and the role they play in your overall health.  Cardiac Rehab from 06/03/2018 in [facility] Cardiac and Pulmonary Rehab  Date  05/29/18  Educator  CE  Instruction Review Code  1- Verbalizes Understanding      Sleep  Hygiene: -Provides group verbal and written instruction about how sleep can affect your health.  Define sleep hygiene, discuss sleep cycles and impact of sleep habits. Review good sleep hygiene tips.    Other: -Provides group and verbal instruction on various topics (see comments)   Knowledge Questionnaire Score: Knowledge Questionnaire Score - 05/01/18 1152      Knowledge Questionnaire Score   Pre Score  26/28 Reviewed correct responses with Deidre Ala today. He verbalized understanding and had no further questions       Core Components/Risk Factors/Patient Goals at Admission: Personal Goals and Risk Factors at Admission - 05/01/18 1443      Core Components/Risk Factors/Patient Goals on Admission    Weight Management  Weight Maintenance;Yes    Intervention  Weight Management: Develop a combined nutrition and exercise program designed to reach desired caloric intake, while maintaining appropriate intake of nutrient and fiber, sodium and fats, and appropriate energy expenditure required for the weight goal.;Weight Management: Provide education and appropriate resources to help participant work on and attain dietary goals.    Admit Weight  163 lb 12.8 oz (74.3 kg) Was in hospital for over 4 weeks, loos muscle mass and weight. Wants to regain muscle mass.     Goal Weight: Short Term  165 lb (74.8 kg)    Goal Weight: Long Term  170 lb (77.1 kg)    Expected Outcomes  Short Term: Continue to assess and modify interventions until short term weight is achieved;Long Term: Adherence to nutrition and physical activity/exercise program aimed toward attainment of established weight goal;Weight Maintenance: Understanding of the daily nutrition guidelines, which includes 25-35% calories from fat, 7% or less cal from saturated fats, less than 254m cholesterol, less than 1.5gm of sodium, & 5 or more servings of fruits and vegetables daily    Diabetes  Yes    Intervention  Provide education about signs/symptoms  and action to take for hypo/hyperglycemia.;Provide education about proper nutrition, including hydration, and aerobic/resistive exercise prescription along with prescribed medications to achieve blood glucose in normal ranges: Fasting glucose 65-99 mg/dL    Expected Outcomes  Short Term: Participant verbalizes understanding of the signs/symptoms and immediate care of hyper/hypoglycemia, proper foot care and importance of medication, aerobic/resistive exercise and nutrition plan for blood glucose control.;Long Term: Attainment of HbA1C < 7%.    Hypertension  Yes    Intervention  Provide education on lifestyle modifcations including regular physical activity/exercise, weight management, moderate sodium restriction and increased consumption of fresh fruit, vegetables, and low fat dairy, alcohol moderation, and smoking cessation.;Monitor prescription use compliance.    Expected Outcomes  Short Term: Continued assessment and intervention until BP is < 140/920mHG in hypertensive participants. < 130/806mG in hypertensive participants with diabetes, heart failure or chronic kidney disease.;Long Term: Maintenance of blood pressure at goal levels.    Lipids  Yes    Intervention  Provide education and support for participant on nutrition & aerobic/resistive exercise along with prescribed medications to achieve LDL <43m53mDL >40mg42m Expected Outcomes  Short Term: Participant states understanding of desired cholesterol values and is compliant with medications prescribed. Participant is following exercise prescription and nutrition guidelines.;Long Term: Cholesterol controlled with medications as prescribed, with individualized exercise RX and with personalized nutrition plan. Value goals: LDL < 43mg,36m >  40 mg.    Personal Goal Other  Yes    Personal Goal  Wants to get back to riding his [place]. Lost muscle mass will in the hospital for over 4 weeks    Intervention  Provide exercise prescription to work on  musvcle mass gain, strength and stamina    Expected Outcomes  ST: [place] will start to build his muscle strength and stamina while following his exercise prescription. ZD:GLOVF will be able to ride his [place] again without concern       Core Components/Risk Factors/Patient Goals Review:  Goals and Risk Factor Review    Row Name 05/29/18 0948             Core Components/Risk Factors/Patient Goals Review   Personal Goals Review  Weight Management/Obesity;Lipids;Hypertension;Diabetes       Review  Terrel has been trying to gain muscle weight back.  He is up to 166 lbs and feeling stronger.  His blood pressures have been doing well and he checks them on occasion at home.  We talked about increase the frequency he checks his pressures at home.  Baruch's blood sugars have been doing well.  Today he was 147 mg/dl fasting.  He checks it daily in the morning.  He continues to do well on his medications and would like to get back on CBD oil and other supplements.  He has an appointmnent with Dr. Roxy Manns on the 24th and will ask him about those then.         Expected Outcomes  Short: Talk to doctor about CBD oil and check blood pressures more frequently.  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          Core Components/Risk Factors/Patient Goals at Discharge (Final Review):  Goals and Risk Factor Review - 05/29/18 0948      Core Components/Risk Factors/Patient Goals Review   Personal Goals Review  Weight Management/Obesity;Lipids;Hypertension;Diabetes    Review  Kemarion has been trying to gain muscle weight back.  He is up to 166 lbs and feeling stronger.  His blood pressures have been doing well and he checks them on occasion at home.  We talked about increase the frequency he checks his pressures at home.  Tresean's blood sugars have been doing well.  Today he was 147 mg/dl fasting.  He checks it daily in the morning.  He continues to do well on his medications and would like to get back on CBD oil and  other supplements.  He has an appointmnent with Dr. Roxy Manns on the 24th and will ask him about those then.      Expected Outcomes  Short: Talk to doctor about CBD oil and check blood pressures more frequently.  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       ITP Comments: ITP Comments    Row Name 05/01/18 1155 05/07/18 0608 05/27/18 1458 06/04/18 6433     ITP Comments  Medical review complete, ITP sent to Dr Loleta Chance for review,changes as needed and signature.  Documentation of diagnosis can be found in [facility] 02/07/2018 admission  30 day review. Continue with ITP unless directed changes per Medical Director  New to program  Mayur has been out on vacation for the last two weeks.   30 day review. Continue with ITP unless directed changes per Medical Director review       Comments:

## 2018-06-05 ENCOUNTER — Encounter: Payer: Medicare Other | Admitting: *Deleted

## 2018-06-05 DIAGNOSIS — Z952 Presence of prosthetic heart valve: Secondary | ICD-10-CM | POA: Diagnosis not present

## 2018-06-05 DIAGNOSIS — Z951 Presence of aortocoronary bypass graft: Secondary | ICD-10-CM | POA: Diagnosis not present

## 2018-06-05 DIAGNOSIS — Z48812 Encounter for surgical aftercare following surgery on the circulatory system: Secondary | ICD-10-CM | POA: Diagnosis not present

## 2018-06-05 NOTE — Progress Notes (Signed)
Daily Session Note  Patient Details  Name: Harold Parker MRN: [id number] Date of Birth: 1946/01/01 Referring Provider:     Cardiac Rehab from 05/01/2018 in [facility] Cardiac and Pulmonary Rehab  Referring Provider  Arida      Encounter Date: 06/05/2018  Check In: Session Check In - 06/05/18 1026      Check-In   Location  [facility]-Cardiac & Pulmonary Rehab    Staff Present  Gerlene Burdock, RN, BSN;Maraki Macquarrie Luan Pulling, MA, RCEP, CCRP, Exercise Physiologist;Amanda Oletta Darter, [place], ACSM CEP, Exercise Physiologist    Supervising physician immediately available to respond to emergencies  See telemetry face sheet for immediately available ER MD    Medication changes reported      No    Fall or balance concerns reported     No    Warm-up and Cool-down  Performed on first and last piece of equipment    Resistance Training Performed  Yes    VAD Patient?  No      Pain Assessment   Currently in Pain?  No/denies          Social History   Tobacco Use  Smoking Status Former Smoker  . Packs/day: 1.00  . Years: 15.00  . Pack years: 15.00  . Types: Cigarettes  . Last attempt to quit: 02/02/2018  . Years since quitting: 0.3  Smokeless Tobacco Never Used  Tobacco Comment   Quit 01/2018 - on 05/01/2018 talked to Harold Parker about Relaspe concerns. He ststaes that he has no taste for tobacco since he quit in Feb.    Goals Met:  Independence with exercise equipment Exercise tolerated well No report of cardiac concerns or symptoms Strength training completed today  Goals Unmet:  Not Applicable  Comments: Pt able to follow exercise prescription today without complaint.  Will continue to monitor for progression.    Dr. Emily Parker is Medical Director for [place] and [facility].

## 2018-06-09 ENCOUNTER — Ambulatory Visit: Payer: Medicare Other | Admitting: Thoracic Surgery (Cardiothoracic Vascular Surgery)

## 2018-06-09 ENCOUNTER — Encounter: Payer: Self-pay | Admitting: Dietician

## 2018-06-10 DIAGNOSIS — Z952 Presence of prosthetic heart valve: Secondary | ICD-10-CM | POA: Diagnosis not present

## 2018-06-10 DIAGNOSIS — Z48812 Encounter for surgical aftercare following surgery on the circulatory system: Secondary | ICD-10-CM | POA: Diagnosis not present

## 2018-06-10 DIAGNOSIS — Z951 Presence of aortocoronary bypass graft: Secondary | ICD-10-CM | POA: Diagnosis not present

## 2018-06-10 NOTE — Progress Notes (Signed)
Daily Session Note  Patient Details  Name: Aqil Goetting MRN: [id number] Date of Birth: [birth year] Referring Provider:     Cardiac Rehab from 05/01/2018 in [facility] Cardiac and Pulmonary Rehab  Referring Provider  Arida      Encounter Date: 06/10/2018  Check In: Session Check In - 06/10/18 0846      Check-In   Location  [facility]-Cardiac & Pulmonary Rehab    Staff Present  Heath Lark, RN, BSN, CCRP;Jessica Lucas, MA, RCEP, CCRP, Exercise Physiologist;Jamesyn [place] Oletta Darter, [place], ACSM CEP, Exercise Physiologist    Supervising physician immediately available to respond to emergencies  See telemetry face sheet for immediately available ER MD    Medication changes reported      No    Fall or balance concerns reported     No    Warm-up and Cool-down  Performed on first and last piece of equipment    Resistance Training Performed  Yes    VAD Patient?  No    PAD/SET Patient?  No      Pain Assessment   Currently in Pain?  No/denies    Multiple Pain Sites  No          Social History   Tobacco Use  Smoking Status Former Smoker  . Packs/day: 1.00  . Years: 15.00  . Pack years: 15.00  . Types: Cigarettes  . Last attempt to quit: 02/02/2018  . Years since quitting: 0.3  Smokeless Tobacco Never Used  Tobacco Comment   Quit 01/2018 - on 05/01/2018 talked to Erica about Relaspe concerns. He ststaes that he has no taste for tobacco since he quit in Feb.    Goals Met:  Independence with exercise equipment Exercise tolerated well No report of cardiac concerns or symptoms Strength training completed today  Goals Unmet:  Not Applicable  Comments: Pt able to follow exercise prescription today without complaint.  Will continue to monitor for progression.    Dr. Emily Filbert is Medical Director for [place] and [facility].

## 2018-06-17 ENCOUNTER — Encounter: Payer: Medicare Other | Attending: Cardiovascular Disease

## 2018-06-17 DIAGNOSIS — Z951 Presence of aortocoronary bypass graft: Secondary | ICD-10-CM | POA: Insufficient documentation

## 2018-06-17 DIAGNOSIS — Z952 Presence of prosthetic heart valve: Secondary | ICD-10-CM | POA: Insufficient documentation

## 2018-06-17 DIAGNOSIS — Z48812 Encounter for surgical aftercare following surgery on the circulatory system: Secondary | ICD-10-CM | POA: Insufficient documentation

## 2018-06-23 DIAGNOSIS — H524 Presbyopia: Secondary | ICD-10-CM | POA: Diagnosis not present

## 2018-06-23 DIAGNOSIS — H2513 Age-related nuclear cataract, bilateral: Secondary | ICD-10-CM | POA: Diagnosis not present

## 2018-06-23 DIAGNOSIS — H04123 Dry eye syndrome of bilateral lacrimal glands: Secondary | ICD-10-CM | POA: Diagnosis not present

## 2018-06-24 DIAGNOSIS — Z951 Presence of aortocoronary bypass graft: Secondary | ICD-10-CM

## 2018-06-24 DIAGNOSIS — Z48812 Encounter for surgical aftercare following surgery on the circulatory system: Secondary | ICD-10-CM | POA: Diagnosis not present

## 2018-06-24 DIAGNOSIS — Z952 Presence of prosthetic heart valve: Secondary | ICD-10-CM | POA: Diagnosis not present

## 2018-06-24 NOTE — Progress Notes (Signed)
Daily Session Note  Patient Details  Name: Harold Parker MRN: [id number] Date of Birth: 02/17/1946 Referring Provider:     Cardiac Rehab from 05/01/2018 in [facility] Cardiac and Pulmonary Rehab  Referring Provider  Arida      Encounter Date: 06/24/2018  Check In: Session Check In - 06/24/18 0914      Check-In   Location  [facility]-Cardiac & Pulmonary Rehab    Staff Present  Joseph Hood RCP,RRT,BSRT;Susanne Bice, RN, BSN, CCRP;Mandi Ballard, BS, PEC    Supervising physician immediately available to respond to emergencies  See telemetry face sheet for immediately available ER MD    Medication changes reported      No    Fall or balance concerns reported     No    Warm-up and Cool-down  Performed on first and last piece of equipment    Resistance Training Performed  Yes    VAD Patient?  No      Pain Assessment   Currently in Pain?  No/denies          Social History   Tobacco Use  Smoking Status Former Smoker  . Packs/day: 1.00  . Years: 15.00  . Pack years: 15.00  . Types: Cigarettes  . Last attempt to quit: 02/02/2018  . Years since quitting: 0.3  Smokeless Tobacco Never Used  Tobacco Comment   Quit 01/2018 - on 05/01/2018 talked to [place] about Relaspe concerns. He ststaes that he has no taste for tobacco since he quit in Feb.    Goals Met:  Independence with exercise equipment Exercise tolerated well No report of cardiac concerns or symptoms Strength training completed today  Goals Unmet:  Not Applicable  Comments: Pt able to follow exercise prescription today without complaint.  Will continue to monitor for progression.   Dr. Mark Miller is Medical Director for [facility] and [facility]. 

## 2018-06-26 ENCOUNTER — Encounter: Payer: Self-pay | Admitting: Cardiovascular Disease

## 2018-06-26 ENCOUNTER — Telehealth: Payer: Self-pay | Admitting: Cardiovascular Disease

## 2018-06-26 ENCOUNTER — Ambulatory Visit: Payer: Medicare Other | Admitting: Thoracic Surgery (Cardiothoracic Vascular Surgery)

## 2018-06-26 ENCOUNTER — Ambulatory Visit (INDEPENDENT_AMBULATORY_CARE_PROVIDER_SITE_OTHER): Payer: Medicare Other | Admitting: Cardiovascular Disease

## 2018-06-26 VITALS — BP 120/60 | HR 73 | Ht 65.0 in | Wt 168.0 lb

## 2018-06-26 DIAGNOSIS — I359 Nonrheumatic aortic valve disorder, unspecified: Secondary | ICD-10-CM

## 2018-06-26 DIAGNOSIS — I5022 Chronic systolic (congestive) heart failure: Secondary | ICD-10-CM | POA: Diagnosis not present

## 2018-06-26 DIAGNOSIS — I2510 Atherosclerotic heart disease of native coronary artery without angina pectoris: Secondary | ICD-10-CM | POA: Diagnosis not present

## 2018-06-26 DIAGNOSIS — I255 Ischemic cardiomyopathy: Secondary | ICD-10-CM | POA: Diagnosis not present

## 2018-06-26 DIAGNOSIS — E785 Hyperlipidemia, unspecified: Secondary | ICD-10-CM | POA: Diagnosis not present

## 2018-06-26 NOTE — Telephone Encounter (Signed)
Patient forms to continue Temporary Disability completed by Dr. Kirke Corin.  Placed at front for patient pick up.

## 2018-06-26 NOTE — Patient Instructions (Signed)
Medication Instructions: Continue same medications.   Labwork: None.   Procedures/Testing: None.   Follow-Up: 4 months with Dr. Zoanne Newill.   Any Additional Special Instructions Will Be Listed Below (If Applicable).     If you need a refill on your cardiac medications before your next appointment, please call your pharmacy.   

## 2018-06-26 NOTE — Progress Notes (Signed)
Cardiology Office Note   Date:  06/26/2018   ID:  Harold Parker, DOB [birth year], MRN [id number]  PCP:  System, Pcp Not In  Cardiologist:   [name], MD   Chief Complaint  Patient presents with  . other    3 month f/u c/o sob and chest discomfort. Meds reviewed verbally with pt. Meds reviewed verbally with pt.      History of Present Illness:  Harold Parker is a 72 y.o. male who presents for a follow-up visit regarding coronary artery disease and aortic stenosis status post CABG and bioprosthetic aortic valve in March 2019.  Other medical problems include hypertension, hyperlipidemia, type 2 diabetes, COPD and previous tobacco use.  The patient moved from [place] earlier this year.  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 He presented in February of this year with non-ST elevation myocardial infarction.  Cardiac catheterization revealed severe left main stenosis in addition to significant disease involving OM1 and RCA.  EF was 40 to 45% with moderate aortic stenosis.  The patient underwent CABG and aortic valve replacement in March.  He had postoperative atrial fibrillation that was controlled with amiodarone.  He was discharged home on aspirin and Plavix.  He presented back with an upper GI bleed with a hemoglobin 4.7.  EGD showed duodenal ulcers.  He was taken off dual antiplatelet therapy and ultimately low-dose aspirin was resumed.  He has been doing well with no recent chest pain, palpitations or dizziness.  He is attending cardiac rehab.  He reports mild exertional dyspnea. He had an echocardiogram done in April which showed an EF of 40 to 45% with normal functioning bioprosthetic aortic valve.  Past Medical History:  Diagnosis Date  . Bilateral carotid bruits    a. 01/2018 U/S: < 50% bilat ICA stenosis.  Marland Kitchen CAD (coronary artery disease)    a. 1998 s/p MI and BMS [facility] [place]); b. 1999 redo PCI/rotablator in setting of  what sounds like ISR;  c. Multiple stress tests over the years - last ~ 2017, reportedly nl; d. 01/2018 NSTEMI/Cath: LM 10m/d, LAD 50p, 40p/m, D1 60ost, OM1 95, RCA 100ost/p w/ L->R collats, EF 45%; e. s/p 3V CABG 02/19/18 (LIMA-LAD, VG-D1, VG-OM)  . Chronic lower back pain   . COPD (chronic obstructive pulmonary disease) (HCC)   . GIB (gastrointestinal bleeding)    a. 02/2018 GIB and anemia w/ Hgb of 4.7 on presentation; b. 03/2018 EGD: 2 nonbleeding duodenal ulcers.  Marland Kitchen HTN (hypertension)   . Hypercholesteremia   . Ischemic cardiomyopathy    a. 01/2018 Echo: EF 40-45%, mid-apicalanteroseptal, ant, apical sev HK, mod apicalinferior HK. Gr2 DD. Mod AS, mild MR, mod dil LA, PASP .  . Moderate aortic stenosis    a. 01/2018 Echo: Mod AS, mean grad (S) , Valve area (VTI) 1.06 cm^2, (Vmax) 1.27 cm^2; b. 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  . Myocardial infarction (HCC) ~ 1998/1999  . 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 02/19/2018 AVR: 25 mm Edwards Inspiris Resilia stented bovine pericardial tissue valve  . 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 Tobacco abuse   . Type II diabetes mellitus (HCC)     Past Surgical History:  Procedure Laterality Date  . AORTIC VALVE REPLACEMENT N/A 02/19/2018   Procedure: AORTIC VALVE REPLACEMENT (AVR);  Surgeon: Purcell Nails, MD;  Location: [facility] OR;  Service: Open Heart Surgery;  Laterality: N/A;  . COLONOSCOPY    . CORONARY ANGIOPLASTY WITH STENT PLACEMENT  ~ 1998/1999  . CORONARY ARTERY BYPASS GRAFT N/A 02/19/2018   Procedure: CORONARY ARTERY BYPASS GRAFTING (CABG) x three , using left internal mammary artery and right leg greater saphenous vein harvested endoscopically;  Surgeon: Purcell Nails, MD;  Location[facility] OR;  Service: Open Heart Surgery;  Laterality: N/A;  . ESOPHAGOGASTRODUODENOSCOPY (EGD) WITH PROPOFOL N/A 03/18/2018   Procedure: ESOPHAGOGASTRODUODENOSCOPY (EGD) WITH  PROPOFOL;  Surgeon: Midge Minium, MD;  Location: [facility] ENDOSCOPY;  Service: Endoscopy;  Laterality: N/A;  . LEFT HEART CATH AND CORONARY ANGIOGRAPHY N/A 02/06/2018   Procedure: LEFT HEART CATH AND CORONARY ANGIOGRAPHY;  Surgeon: Iran Ouch, MD;  Location: [facility] INVASIVE CV LAB;  Service: Cardiovascular;  Laterality: N/A;  . TEE WITHOUT CARDIOVERSION N/A 02/19/2018   Procedure: TRANSESOPHAGEAL ECHOCARDIOGRAM (TEE);  Surgeon: Purcell Nails, MD;  Location: [facility] OR;  Service: Open Heart Surgery;  Laterality: N/A;  . TONSILLECTOMY       Current Outpatient Medications  Medication Sig Dispense Refill  . acetaminophen (TYLENOL) 325 MG tablet Take 650 mg by mouth at bedtime.    . ALPRAZolam (XANAX) 0.5 MG tablet Take 0.5 mg by mouth at bedtime as needed for anxiety.    . Ascorbic Acid (VITAMIN C) 1000 MG tablet Take 1,000 mg by mouth daily.    Marland Kitchen aspirin EC 81 MG tablet Take 81 mg by mouth daily.    Marland Kitchen atorvastatin (LIPITOR) 80 MG tablet TAKE 1 TABLET BY MOUTH DAILY AT 6 PM 30 tablet 0  . esomeprazole (NEXIUM) 40 MG capsule Take 40 mg by mouth daily at 12 noon.    . ezetimibe (ZETIA) 10 MG tablet Take 10 mg by mouth daily.    . folic acid (FOLVITE) 400 MCG tablet Take 400 mcg by mouth every evening.    . furosemide (LASIX) 40 MG tablet Take 1 tablet (40 mg) by mouth once daily 90 tablet 3  . glipiZIDE (GLUCOTROL) 5 MG tablet Take 0.5 tablets (2.5 mg total) by mouth daily before breakfast. 30 tablet 1  . ipratropium-albuterol (DUONEB) 0.5-2.5 (3) MG/3ML SOLN Take 3 mLs by nebulization every 6 (six) hours as needed. 360 mL 1  . losartan (COZAAR) 100 MG tablet Take 1 tablet (100 mg total) by mouth daily. 90 tablet 3  . metFORMIN (GLUCOPHAGE) 1000 MG tablet TAKE 1 TABLET BY MOUTH TWICE DAILY WITH A MEAL 60 tablet 0  . metoprolol tartrate (LOPRESSOR) 50 MG tablet TAKE 1 TABLET BY MOUTH TWICE DAILY 60 tablet 0  . Vitamin D, Cholecalciferol, 1000 units TABS Take 1 tablet by mouth every morning.      No  current facility-administered medications for this visit.     Allergies:   Patient has no known allergies.    Social History:  The patient  reports that he quit smoking about 4 months ago. His smoking use included cigarettes. He has a 15.00 pack-year smoking history. He has never used smokeless tobacco. He reports that he drinks alcohol. He reports that he does not use drugs.   Family History:  The patient's family history includes Lymphoma in his mother; Peripheral vascular disease in his father.    ROS:  Please see the history of present illness.   Otherwise, review of systems are positive for none.   All other systems are reviewed and negative.    PHYSICAL EXAM: VS:  BP 120/60 (BP Location:  Left Arm, Patient Position: Sitting, Cuff Size: Normal)   Pulse 73   Ht 5\' 5"  (1.651 m)   Wt 168 lb (76.2 kg)   BMI 27.96 kg/m  , BMI Body mass index is 27.96 kg/m. GEN: Well nourished, well developed, in no acute distress  HEENT: normal  Neck: no JVD, carotid bruits, or masses Cardiac: RRR; no murmurs, rubs, or gallops,no edema  Respiratory:  clear to auscultation bilaterally, normal work of breathing GI: soft, nontender, nondistended, + BS MS: no deformity or atrophy  Skin: warm and dry, no rash Neuro:  Strength and sensation are intact Psych: euthymic mood, full affect   EKG:  EKG is ordered today. The ekg ordered today demonstrates normal sinus rhythm with old inferior infarct.  Anterolateral T wave changes suggestive of ischemia.   Recent Labs: 02/18/2018: B Natriuretic Peptide 167.8 02/25/2018: Magnesium 2.1; TSH 1.408 03/27/2018: ALT 15; BUN 17; Creatinine, Ser 0.79; Hemoglobin 12.1; Platelets 275; Potassium 5.2; Sodium 142    Lipid Panel    Component Value Date/Time   CHOL 102 03/27/2018 0900   TRIG 84 03/27/2018 0900   HDL 35 (L) 03/27/2018 0900   CHOLHDL 2.9 03/27/2018 0900   CHOLHDL 3.3 02/06/2018 0244   VLDL 23 02/06/2018 0244   LDLCALC 50 03/27/2018 0900      Wt  Readings from Last 3 Encounters:  06/26/18 168 lb (76.2 kg)  05/01/18 163 lb 12.8 oz (74.3 kg)  04/07/18 159 lb (72.1 kg)       No flowsheet data found.    ASSESSMENT AND PLAN:  1.  Coronary artery disease involving native coronary arteries without angina: Status post CABG in March 2019.  He continues to attend cardiac rehab and seems to be doing reasonably well.  Continue medical therapy. The patient can resume work on September 3 without restrictions.  2.  Status post bioprosthetic aortic valve replacement for aortic stenosis: Most recent echo showed normal functioning bioprosthetic aortic valve.  3.  Chronic systolic heart failure with mildly reduced LV systolic function.  He appears to be euvolemic.  Continue treatment with metoprolol and losartan.  4.  Hyperlipidemia: Currently on high-dose atorvastatin and Zetia.  Most recent lipid profile showed an LDL of 50.    Disposition:   FU with me in 4 months  Signed,  [name], MD  06/26/2018 11:28 AM    [facility]

## 2018-06-30 ENCOUNTER — Encounter: Payer: Self-pay | Admitting: Thoracic Surgery (Cardiothoracic Vascular Surgery)

## 2018-06-30 ENCOUNTER — Ambulatory Visit (INDEPENDENT_AMBULATORY_CARE_PROVIDER_SITE_OTHER): Payer: Medicare Other | Admitting: Thoracic Surgery (Cardiothoracic Vascular Surgery)

## 2018-06-30 ENCOUNTER — Other Ambulatory Visit: Payer: Self-pay

## 2018-06-30 VITALS — BP 127/58 | HR 72 | Resp 16 | Ht 65.0 in | Wt 168.0 lb

## 2018-06-30 DIAGNOSIS — Z953 Presence of xenogenic heart valve: Secondary | ICD-10-CM

## 2018-06-30 DIAGNOSIS — I255 Ischemic cardiomyopathy: Secondary | ICD-10-CM

## 2018-06-30 DIAGNOSIS — Z951 Presence of aortocoronary bypass graft: Secondary | ICD-10-CM

## 2018-06-30 NOTE — Progress Notes (Signed)
[address]       [name] [address]             [phone]     CARDIOTHORACIC SURGERY OFFICE NOTE  Referring Provider is Lennette Bihari, MD  Primary Cardiologist is Iran Ouch, MD PCP is System, Pcp Not In   HPI:  Patient is a 72 year old male with known history of coronary artery disease, hypertension, hyperlipidemia, type 2 diabetes mellitus,andCOPD with longstanding tobacco abuse, COPD and chronic bronchitis who originally presented with acute flulike syndrome hypoxemic respiratory failure consistent with acute combined systolic and diastolic congestive heart failure superimposed on chronic lung disease.  He had positive titers for influenza A and ruled in for an acute non-ST segment elevation myocardial infarction.  He was found to have severe aortic stenosis and mild to moderate left ventricular systolic dysfunction.  He ultimately underwent aortic valve replacement using a stented bioprosthetic tissue valve and coronary artery bypass grafting times 3 on February 19, 2018.  His early postoperative recovery was uneventful and he was discharged home with home oxygen therapy on the seventh postoperative day on dual antiplatelet therapy including aspirin and Plavix.  He was later readmitted to the hospital with a GI bleed requiring transfusion.  Both aspirin and Plavix were stopped and the bleeding resolved.  Low-dose aspirin was eventually resumed.  He was last seen here in our office on April 07, 2018 at which time he was doing well.  Follow-up echocardiogram was performed April 10, 2018 which demonstrated normal functioning bioprosthetic tissue valve in aortic position.  Mean transvalvular gradient across aortic valve is estimated only 4 mmHg.  There was no aortic insufficiency.  Left ventricular systolic function was unchanged from preoperatively with ejection fraction estimated 40 to 45%.  Since then the patient has participated in the cardiac rehab program.  He  was recently seen in follow-up by Dr. Kirke Corin who has cleared him to return to work in early September.  The patient returns to our office today and reports that he is doing extremely well.  He states that he only gets short of breath with more strenuous exertion.  He has not had any chest pain or chest tightness with exertion.  He has very mild residual soreness or "sensitivity" of the anterior chest wall related to his previous surgery.  He is pleased with his progress and plans to retire from his previous job and moved permanently to [place] [place] next month.     Current Outpatient Medications  Medication Sig Dispense Refill  . acetaminophen (TYLENOL) 325 MG tablet Take 650 mg by mouth at bedtime.    . ALPRAZolam (XANAX) 0.5 MG tablet Take 0.5 mg by mouth at bedtime as needed for anxiety.    . Ascorbic Acid (VITAMIN C) 1000 MG tablet Take 1,000 mg by mouth daily.    Marland Kitchen aspirin EC 81 MG tablet Take 81 mg by mouth daily.    Marland Kitchen atorvastatin (LIPITOR) 80 MG tablet TAKE 1 TABLET BY MOUTH DAILY AT 6 PM 30 tablet 0  . esomeprazole (NEXIUM) 40 MG capsule Take 40 mg by mouth daily at 12 noon.    . ezetimibe (ZETIA) 10 MG tablet Take 10 mg by mouth daily.    . folic acid (FOLVITE) 400 MCG tablet Take 400 mcg by mouth every evening.    Marland Kitchen glipiZIDE (GLUCOTROL) 5 MG tablet Take 0.5 tablets (2.5 mg total) by mouth daily before breakfast. 30 tablet 1  . ipratropium-albuterol (DUONEB)  0.5-2.5 (3) MG/3ML SOLN Take 3 mLs by nebulization every 6 (six) hours as needed. 360 mL 1  . losartan (COZAAR) 100 MG tablet Take 1 tablet (100 mg total) by mouth daily. 90 tablet 3  . metFORMIN (GLUCOPHAGE) 1000 MG tablet TAKE 1 TABLET BY MOUTH TWICE DAILY WITH A MEAL 60 tablet 0  . metoprolol tartrate (LOPRESSOR) 50 MG tablet TAKE 1 TABLET BY MOUTH TWICE DAILY 60 tablet 0  . Vitamin D, Cholecalciferol, 1000 units TABS Take 1 tablet by mouth every morning.     . furosemide (LASIX) 40 MG tablet Take 1 tablet (40 mg) by mouth once  daily 90 tablet 3   No current facility-administered medications for this visit.       Physical Exam:   BP (!) 127/58 (BP Location: Right Arm, Patient Position: Sitting, Cuff Size: Normal)   Pulse 72   Resp 16   Ht 5\' 5"  (1.651 m)   Wt 168 lb (76.2 kg)   SpO2 95% Comment: RA  BMI 27.96 kg/m   General:  Well-appearing  Chest:   Clear to auscultation  CV:   Regular rate and rhythm  Incisions:  Completely healed, sternum is stable  Abdomen:  Soft nontender  Extremities:  Warm and well-perfused  Diagnostic Tests:  Transthoracic Echocardiography  Patient:    Harold Parker, Harold Parker MR #:       [id number] Study Date: 04/10/2018 Gender:     M Age:        61 Height:     167.6 cm Weight:     72.1 kg BSA:        1.84 m^2 Pt. Status: Room:   ATTENDING    Default, Provider [phone]  [facility]     Nicolasa Ducking, MD  REFERRING    Nicolasa Ducking, MD  PERFORMING   [place], Pole Ojea  SONOGRAPHER  Quentin Ore, RVT, RDCS, RDMS  cc:  ------------------------------------------------------------------- LV EF: 40% -   45%  ------------------------------------------------------------------- History:   PMH:  S/P CABG and AVR.  Coronary artery disease. Ischemic cardiomyopathy.  Congestive heart failure.  Aortic valve disease.  Primary pulmonary hypertension.  Risk factors:  Former tobacco use. Diabetes mellitus. Dyslipidemia.  ------------------------------------------------------------------- Study Conclusions  - Left ventricle: The cavity size was normal. There was mild   concentric hypertrophy. Systolic function was mildly to   moderately reduced. The estimated ejection fraction was in the   range of 40% to 45%. Severe hypokinesis of the mid to apical   anterior, anteroseptal and apical myocardium. Doppler parameters   are consistent with abnormal left ventricular relaxation (grade 1   diastolic dysfunction). - Aortic valve: Poorly visualized. Unable to comment on  thickness   or motion of leaflets. A bioprosthesis was present. Transvalvular   velocity was within the normal range. There was no stenosis. Mean   gradient (S): 4 mm Hg. - Mitral valve: There was mild regurgitation. - Left atrium: The atrium was mildly dilated. - Right ventricle: Systolic function was normal. - Pulmonary arteries: Systolic pressure could not be accurately   estimated.  Impressions:  - Compared to prior study 01/2018, aortic valve stenosis appears to   have resolved.  ------------------------------------------------------------------- Study data:  The previous study was not available, so comparison was made to the report of 02/06/2018.  Study status:  Routine. Procedure:  Transthoracic echocardiography. Image quality was fair. Intravenous contrast (Definity) was administered.  Study completion:  There were no complications.          Transthoracic echocardiography.  M-mode,  complete 2D, spectral Doppler, and color Doppler.  Birthdate:  Patient birthdate: [birth year].  Age:  Patient is 72 yr old.  Sex:  Gender: male.    BMI: 25.7 kg/m^2.  Blood pressure:     138/70  Patient status:  Outpatient.  Study date: Study date: 04/10/2018. Study time: 11:12 AM.  -------------------------------------------------------------------  ------------------------------------------------------------------- Left ventricle:  The cavity size was normal. There was mild concentric hypertrophy. Systolic function was mildly to moderately reduced. The estimated ejection fraction was in the range of 40% to 45%.  Regional wall motion abnormalities:  Severe hypokinesis of the mid to apical anterior, anteroseptal and apical myocardium. Doppler parameters are consistent with abnormal left ventricular relaxation (grade 1 diastolic dysfunction).  ------------------------------------------------------------------- Aortic valve:  Poorly visualized. Unable to comment on thickness or motion of  leaflets. A bioprosthesis was present. Mobility was not restricted.  Doppler:  Transvalvular velocity was within the normal range. There was no stenosis. There was no regurgitation.    VTI ratio of LVOT to aortic valve: 0.54. Valve area (VTI): 2.05 cm^2. Indexed valve area (VTI): 1.11 cm^2/m^2. Mean velocity ratio of LVOT to aortic valve: 0.57. Valve area (Vmean): 2.17 cm^2. Indexed valve area (Vmean): 1.18 cm^2/m^2.    Mean gradient (S): 4 mm Hg. Peak gradient (S): 9 mm Hg.  ------------------------------------------------------------------- Aorta:  Aortic root: The aortic root was normal in size.  ------------------------------------------------------------------- Mitral valve:   Structurally normal valve.   Mobility was not restricted.  Doppler:  Transvalvular velocity was within the normal range. There was no evidence for stenosis. There was mild regurgitation.    Valve area by pressure half-time: 2.89 cm^2. Indexed valve area by pressure half-time: 1.57 cm^2/m^2.    Peak gradient (D): 3 mm Hg.  ------------------------------------------------------------------- Left atrium:  The atrium was mildly dilated.  ------------------------------------------------------------------- Right ventricle:  The cavity size was normal. Wall thickness was normal. Systolic function was normal.  ------------------------------------------------------------------- Pulmonic valve:    Structurally normal valve.   Cusp separation was normal.  Doppler:  Transvalvular velocity was within the normal range. There was no evidence for stenosis. There was no regurgitation.  ------------------------------------------------------------------- Tricuspid valve:   Structurally normal valve.    Doppler: Transvalvular velocity was within the normal range. There was no regurgitation.  ------------------------------------------------------------------- Pulmonary artery:   The main pulmonary artery was  normal-sized. Systolic pressure could not be accurately estimated.  ------------------------------------------------------------------- Right atrium:  The atrium was normal in size.  ------------------------------------------------------------------- Pericardium:  There was no pericardial effusion.  ------------------------------------------------------------------- Systemic veins: Inferior vena cava: The vessel was normal in size.  ------------------------------------------------------------------- Measurements   Left ventricle                           Value          Reference  LV ID, ED, PLAX chordal                  44    mm       43 - 52  LV ID, ES, PLAX chordal                  31    mm       23 - 38  LV fx shortening, PLAX chordal           30    %        >=29  LV PW thickness, ED  18    mm       ----------  IVS/LV PW ratio, ED                      0.83           <=1.3  Stroke volume, 2D                        57    ml       ----------  Stroke volume/bsa, 2D                    31    ml/m^2   ----------  LV end-diastolic volume, 1-p A4C         130   ml       ----------  LV ejection fraction, 1-p A4C            45    %        ----------  LV end-diastolic volume/bsa, 1-p         70    ml/m^2   ----------  A4C  LV end-diastolic volume, 2-p             117   ml       ----------  LV end-systolic volume, 2-p              63    ml       ----------  LV ejection fraction, 2-p                46    %        ----------  Stroke volume, 2-p                       54    ml       ----------  LV end-diastolic volume/bsa, 2-p         63    ml/m^2   ----------  LV end-systolic volume/bsa, 2-p          34    ml/m^2   ----------  Stroke volume/bsa, 2-p                   29.2  ml/m^2   ----------  LV e&', lateral                           7.51  cm/s     ----------  LV E/e&', lateral                         11.94          ----------  LV e&', medial                             5.11  cm/s     ----------  LV E/e&', medial                          17.55          ----------  LV e&', average                           6.31  cm/s     ----------  LV E/e&', average  14.22          ----------    Ventricular septum                       Value          Reference  IVS thickness, ED                        15    mm       ----------    LVOT                                     Value          Reference  LVOT ID, S                               22    mm       ----------  LVOT area                                3.8   cm^2     ----------  LVOT ID                                  22    mm       ----------  LVOT mean velocity, S                    53.8  cm/s     ----------  LVOT VTI, S                              15.1  cm       ----------  Stroke volume (SV), LVOT DP              57.4  ml       ----------  Stroke index (SV/bsa), LVOT DP           31.1  ml/m^2   ----------    Aortic valve                             Value          Reference  Aortic valve peak velocity, S            146   cm/s     ----------  Aortic valve mean velocity, S            94    cm/s     ----------  Aortic valve VTI, S                      28    cm       ----------  Aortic mean gradient, S                  4     mm Hg    ----------  Aortic peak gradient, S                  9     mm Hg    ----------  VTI ratio, LVOT/AV                       0.54           ----------  Aortic valve area, VTI                   2.05  cm^2     ----------  Aortic valve area/bsa, VTI               1.11  cm^2/m^2 ----------  Velocity ratio, mean, LVOT/AV            0.57           ----------  Aortic valve area, mean velocity         2.17  cm^2     ----------  Aortic valve area/bsa, mean              1.18  cm^2/m^2 ----------  velocity    Left atrium                              Value          Reference  LA ID, A-P, ES                           48    mm       ----------  LA ID/bsa, A-P                    (H)     2.6   cm/m^2   <=2.2  LA volume, S                             55    ml       ----------  LA volume/bsa, S                         29.8  ml/m^2   ----------  LA volume, ES, 1-p A4C                   52.8  ml       ----------  LA volume/bsa, ES, 1-p A4C               28.6  ml/m^2   ----------  LA volume, ES, 1-p A2C                   57.5  ml       ----------  LA volume/bsa, ES, 1-p A2C               31.2  ml/m^2   ----------    Mitral valve                             Value          Reference  Mitral E-wave peak velocity              89.7  cm/s     ----------  Mitral A-wave peak velocity              111   cm/s     ----------  Mitral deceleration time         (H)  261   ms       150 - 230  Mitral pressure half-time                76    ms       ----------  Mitral peak gradient, D                  3     mm Hg    ----------  Mitral E/A ratio, peak                   0.8            ----------  Mitral valve area, PHT, DP               2.89  cm^2     ----------  Mitral valve area/bsa, PHT, DP           1.57  cm^2/m^2 ----------    Right atrium                             Value          Reference  RA ID, S-I, ES, A4C                      47.4  mm       34 - 49  RA area, ES, A4C                         14.8  cm^2     8.3 - 19.5  RA volume, ES, A/L                       38.2  ml       ----------  RA volume/bsa, ES, A/L                   20.7  ml/m^2   ----------    Right ventricle                          Value          Reference  TAPSE                                    16.6  mm       ----------  RV s&', lateral, S                        8.16  cm/s     ----------    Pulmonic valve                           Value          Reference  Pulmonic valve peak velocity, S          76.6  cm/s     ----------  Legend: (L)  and  (H)  mark values outside specified reference range.  ------------------------------------------------------------------- Prepared and Electronically Authenticated  by  Dossie Arbour, MD, [facility] 2019-04-25T18:28:39   Impression:  Patient is doing very well nearly 5 months status post aortic valve replacement and coronary artery bypass grafting  Plan:  We have not recommended any change the patient's current  medications.  I have encouraged the patient to continue to gradually increase his physical activity without any particular limitations at this time.  The long-term importance of continued abstinence from tobacco use is been emphasized.  The patient has been reminded regarding the importance of dental hygiene and the lifelong need for antibiotic prophylaxis for all dental cleanings and other related invasive procedures.  The patient will return to our office for routine follow-up next March, approximately 1 year following his original surgery.  He will call and return sooner should specific problems or questions arise.  I spent in excess of 15 minutes during the conduct of this office consultation and >50% of this time involved direct face-to-face encounter with the patient for counseling and/or coordination of their care.    Salvatore Decent. Cornelius Moras, MD 06/30/2018 10:47 AM

## 2018-06-30 NOTE — Patient Instructions (Signed)

## 2018-07-01 ENCOUNTER — Encounter: Payer: Medicare Other | Admitting: *Deleted

## 2018-07-01 DIAGNOSIS — Z951 Presence of aortocoronary bypass graft: Secondary | ICD-10-CM

## 2018-07-01 DIAGNOSIS — Z952 Presence of prosthetic heart valve: Secondary | ICD-10-CM

## 2018-07-01 DIAGNOSIS — Z48812 Encounter for surgical aftercare following surgery on the circulatory system: Secondary | ICD-10-CM | POA: Diagnosis not present

## 2018-07-01 NOTE — Progress Notes (Signed)
Daily Session Note  Patient Details  Name: Lev Cervone MRN: [id number] Date of Birth: [birth year] Referring Provider:     Cardiac Rehab from 05/01/2018 in [facility]  Referring Provider  Arida      Encounter Date: 07/01/2018  Check In: Session Check In - 07/01/18 0933      Check-In   Location  [facility]-Cardiac & Pulmonary Rehab    Staff Present  Joellyn Rued, BS, PEC;Susanne Bice, RN, BSN, CCRP;Kalynn Declercq [place], [place], RCEP, CCRP, Exercise Physiologist    Supervising physician immediately available to respond to emergencies  See telemetry face sheet for immediately available ER MD    Medication changes reported      No    Fall or balance concerns reported     No    Warm-up and Cool-down  Performed on first and last piece of equipment    Resistance Training Performed  Yes    VAD Patient?  No    PAD/SET Patient?  No      Pain Assessment   Currently in Pain?  No/denies          Social History   Tobacco Use  Smoking Status Former Smoker  . Packs/day: 1.00  . Years: 15.00  . Pack years: 15.00  . Types: Cigarettes  . Last attempt to quit: 02/02/2018  . Years since quitting: 0.4  Smokeless Tobacco Never Used  Tobacco Comment   Quit 01/2018 - on 05/01/2018 talked to Jhaden about Relas[place] concerns. He ststaes that he has no taste for tobacco since he quit in Feb.    Goals Met:  Independence with exercise equipment Exercise tolerated well Personal goals reviewed No report of cardiac concerns or symptoms Strength training completed today  Goals Unmet:  Not Applicable  Comments: Pt able to follow exercise prescription today without complaint.  Will continue to monitor for progression.    Dr. Emily Filbert is Medical Director for [facility].

## 2018-07-02 ENCOUNTER — Encounter: Payer: Self-pay | Admitting: *Deleted

## 2018-07-02 DIAGNOSIS — Z952 Presence of prosthetic heart valve: Secondary | ICD-10-CM

## 2018-07-02 DIAGNOSIS — Z951 Presence of aortocoronary bypass graft: Secondary | ICD-10-CM

## 2018-07-02 NOTE — Progress Notes (Signed)
Cardiac Individual Treatment Plan  Patient Details  Name: Harold Parker MRN: [id number] Date of Birth: 1946/07/03 Referring Provider:     Cardiac Rehab from 05/01/2018 in [facility] Cardiac and Pulmonary Rehab  Referring Provider  Arida      Initial Encounter Date:    Cardiac Rehab from 05/01/2018 in [facility] Cardiac and Pulmonary Rehab  Date  05/01/18      Visit Diagnosis: S/P CABG x 3  S/P aortic valve replacement  Patient's Home Medications on Admission:  Current Outpatient Medications:  .  acetaminophen (TYLENOL) 325 MG tablet, Take 650 mg by mouth at bedtime., Disp: , Rfl:  .  ALPRAZolam (XANAX) 0.5 MG tablet, Take 0.5 mg by mouth at bedtime as needed for anxiety., Disp: , Rfl:  .  Ascorbic Acid (VITAMIN C) 1000 MG tablet, Take 1,000 mg by mouth daily., Disp: , Rfl:  .  aspirin EC 81 MG tablet, Take 81 mg by mouth daily., Disp: , Rfl:  .  atorvastatin (LIPITOR) 80 MG tablet, TAKE 1 TABLET BY MOUTH DAILY AT 6 PM, Disp: 30 tablet, Rfl: 0 .  esomeprazole (NEXIUM) 40 MG capsule, Take 40 mg by mouth daily at 12 noon., Disp: , Rfl:  .  ezetimibe (ZETIA) 10 MG tablet, Take 10 mg by mouth daily., Disp: , Rfl:  .  folic acid (FOLVITE) 409 MCG tablet, Take 400 mcg by mouth every evening., Disp: , Rfl:  .  furosemide (LASIX) 40 MG tablet, Take 1 tablet (40 mg) by mouth once daily, Disp: 90 tablet, Rfl: 3 .  glipiZIDE (GLUCOTROL) 5 MG tablet, Take 0.5 tablets (2.5 mg total) by mouth daily before breakfast., Disp: 30 tablet, Rfl: 1 .  ipratropium-albuterol (DUONEB) 0.5-2.5 (3) MG/3ML SOLN, Take 3 mLs by nebulization every 6 (six) hours as needed., Disp: 360 mL, Rfl: 1 .  losartan (COZAAR) 100 MG tablet, Take 1 tablet (100 mg total) by mouth daily., Disp: 90 tablet, Rfl: 3 .  metFORMIN (GLUCOPHAGE) 1000 MG tablet, TAKE 1 TABLET BY MOUTH TWICE DAILY WITH A MEAL, Disp: 60 tablet, Rfl: 0 .  metoprolol tartrate (LOPRESSOR) 50 MG tablet, TAKE 1 TABLET BY MOUTH TWICE DAILY, Disp: 60 tablet, Rfl: 0 .   Vitamin D, Cholecalciferol, 1000 units TABS, Take 1 tablet by mouth every morning. , Disp: , Rfl:   Past Medical History: Past Medical History:  Diagnosis Date  . Bilateral carotid bruits    a. 01/2018 U/S: < 50% bilat ICA stenosis.  Marland Kitchen CAD (coronary artery disease)    a. 1998 s/p MI and BMS [facility], [place]); b. 1999 redo PCI/rotablator in setting of what sounds like ISR;  c. Multiple stress tests over the years - last ~ 2017, reportedly nl; d. 01/2018 NSTEMI/Cath: LM 45md, LAD 50p, 40p/m, D1 60ost, OM1 95, RCA 100ost/p w/ L->R collats, EF 45%; e. s/p 3V CABG 02/19/18 (LIMA-LAD, VG-D1, VG-OM)  . Chronic lower back pain   . [place]   . GIB (gastrointestinal bleeding)    a. 02/2018 GIB and anemia w/ Hgb of 4.7 on presentation; b. 03/2018 EGD: 2 nonbleeding duodenal ulcers.  .Marland KitchenHTN (hypertension)   . Hypercholesteremia   . Ischemic cardiomyopathy    a. 01/2018 Echo: EF 40-45%, mid-apicalanteroseptal, ant, apical sev HK, mod apicalinferior HK. Gr2 DD. Mod AS, mild MR, mod dil LA, PASP 563mg.  . Moderate aortic stenosis    a. 01/2018 Echo: Mod AS, mean grad (S) 2070m, Valve area (VTI) 1.06 cm^2, (Vmax) 1.27 cm^2; b. s/p bioprosthetic  AVR 02/19/18.  . Myocardial infarction ([place]) ~ 1998/1999  . 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 02/19/2018 AVR: 25 mm Edwards Inspiris Resilia stented bovine pericardial tissue valve  . 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 Tobacco abuse   . Type II diabetes mellitus (HCC)     Tobacco Use: Social History   Tobacco Use  Smoking Status Former Smoker  . Packs/day: 1.00  . Years: 15.00  . Pack years: 15.00  . Types: Cigarettes  . Last attempt to quit: 02/02/2018  . Years since quitting: 0.4  Smokeless Tobacco Never Used  Tobacco Comment   Quit 01/2018 - on 05/01/2018 talked to Jahmil about Relaspe concerns. He ststaes that he has no taste  for tobacco since he quit in Feb.    Labs: Recent Review Flowsheet Data    Labs for ITP Cardiac and Pulmonary Rehab Latest Ref Rng & Units 02/19/2018 02/19/2018 02/20/2018 02/20/2018 03/27/2018   Cholestrol 100 - 199 mg/dL - - - - 102   LDLCALC 0 - 99 mg/dL - - - - 50   HDL >39 mg/dL - - - - 35(L)   Trlycerides 0 - 149 mg/dL - - - - 84   Hemoglobin A1c 4.8 - 5.6 % - - - - -   PHART 7.350 - 7.450 - 7.335(L) 7.361 7.331(L) -   PCO2ART 32.0 - 48.0 mmHg - 40.2 40.1 41.0 -   HCO3 20.0 - 28.0 mmol/L - 21.2 22.5 21.5 -   TCO2 22 - 32 mmol/L _0 -   ACIDBASEDEF 0.0 - 2.0 mmol/L - 4.0(H) 2.0 4.0(H) -   O2SAT % - 97.0 97.0 95.0 -       Exercise Target Goals:    Exercise Program Goal: Individual exercise prescription set using results from initial 6 min walk test and THRR while considering  patient's activity barriers and safety.   Exercise Prescription Goal: Initial exercise prescription builds to 30-45 minutes a day of aerobic activity, 2-3 days per week.  Home exercise guidelines will be given to patient during program as part of exercise prescription that the participant will acknowledge.  Activity Barriers & Risk Stratification: Activity Barriers & Cardiac Risk Stratification - 05/01/18 1450      Activity Barriers & Cardiac Risk Stratification   Activity Barriers  Back Problems HIstory of back injury years ago.  He has lidocaine patches and epidurals as needed       6 Minute Walk: 6 Minute Walk    Row Name 05/01/18 1406         6 Minute Walk   Distance  888 feet     Walk Time  6 minutes     # of Rest Breaks  0     MPH  1.68     METS  2.04     RPE  11     Perceived Dyspnea   0     VO2 Peak  7.17     Symptoms  No     Resting HR  78 bpm     Resting BP  134/64     Resting Oxygen Saturation   94 %     Exercise Oxygen Saturation  during 6 min walk  94 %     Max Ex. HR  110 bpm     Max Ex. BP  126/60  2 Minute Post BP  116/68        Oxygen Initial  Assessment:   Oxygen Re-Evaluation:   Oxygen Discharge (Final Oxygen Re-Evaluation):   Initial Exercise Prescription: Initial Exercise Prescription - 05/01/18 1400      Date of Initial Exercise RX and Referring Provider   Date  05/01/18    Referring Provider  Arida      Treadmill   MPH  1.5    Grade  0    Minutes  15    METs  2.15      Recumbant Bike   Level  1    RPM  60    Watts  16    Minutes  15    METs  2      NuStep   Level  2    SPM  80    Minutes  15    METs  2      Prescription Details   Frequency (times per week)  2    Duration  Progress to 30 minutes of continuous aerobic without signs/symptoms of physical distress      Intensity   THRR 40-80% of Max Heartrate  105-133    Ratings of Perceived Exertion  11-13    Perceived Dyspnea  0-4      Resistance Training   Training Prescription  Yes    Weight  3 lb    Reps  10-15       Perform Capillary Blood Glucose checks as needed.  Exercise Prescription Changes: Exercise Prescription Changes    Row Name 05/01/18 1400 05/13/18 1500 06/03/18 0900 06/12/18 0800       Response to Exercise   Blood Pressure (Admit)  134/64  132/64  132/64  110/64    Blood Pressure (Exercise)  126/60  128/60  128/60  120/68    Blood Pressure (Exit)  116/68  128/60  128/60  124/64    Heart Rate (Admit)  72 bpm  88 bpm  88 bpm  61 bpm    Heart Rate (Exercise)  93 bpm  94 bpm  94 bpm  106 bpm    Heart Rate (Exit)  63 bpm  90 bpm  90 bpm  78 bpm    Oxygen Saturation (Admit)  94 %  -  -  -    Rating of Perceived Exertion (Exercise)  _0 Symptoms  -  none  none  none    Comments  -  second full day of exercise  second full day of exercise  -    Duration  -  Continue with 30 min of aerobic exercise without signs/symptoms of physical distress.  Continue with 30 min of aerobic exercise without signs/symptoms of physical distress.  Continue with 30 min of aerobic exercise without signs/symptoms of physical distress.     Intensity  -  THRR unchanged  THRR unchanged  THRR unchanged      Progression   Progression  -  Continue to progress workloads to maintain intensity without signs/symptoms of physical distress.  Continue to progress workloads to maintain intensity without signs/symptoms of physical distress.  Continue to progress workloads to maintain intensity without signs/symptoms of physical distress.    Average METs  -  2.4  2.4  2.28      Resistance Training   Training Prescription  -  Yes  Yes  Yes    Weight  -  3 lbs  3 lbs  3 lbs    Reps  -  10-15  10-15  10-15      Interval Training   Interval Training  -  No  No  No      Treadmill   MPH  -  -  -  1.5    Grade  -  -  -  0    Minutes  -  -  -  15    METs  -  -  -  2.15      NuStep   Level  -  _0 Minutes  -  _1 METs  -  3  3  2.9      Arm Ergometer   Level  -  _2 Minutes  -  _3 METs  -  1.8  1.8  1.9      Home Exercise Plan   Plans to continue exercise at  -  -  Home (comment)  Home (comment)    Frequency  -  -  Add 2 additional days to program exercise sessions.  Add 2 additional days to program exercise sessions.    Initial Home Exercises Provided  -  -  06/03/18  06/03/18       Exercise Comments: Exercise Comments    Row Name 05/08/18 0932 06/03/18 0942         Exercise Comments  First full day of exercise!  Patient was oriented to gym and equipment including functions, settings, policies, and procedures.  Patient's individual exercise prescription and treatment plan were reviewed.  All starting workloads were established based on the results of the 6 minute walk test done at initial orientation visit.  The plan for exercise progression was also introduced and progression will be customized based on patient's performance and goals.  Reviewed home exercise with pt today.  Pt plans to walk outside and use DB for exercise.  Reviewed THR, pulse, RPE, sign and symptoms, NTG use, and when to  call 911 or MD.  Also discussed weather considerations and indoor options.  Pt voiced understanding.         Exercise Goals and Review: Exercise Goals    Row Name 05/01/18 1405             Exercise Goals   Increase Physical Activity  Yes       Intervention  Provide advice, education, support and counseling about physical activity/exercise needs.;Develop an individualized exercise prescription for aerobic and resistive training based on initial evaluation findings, risk stratification, comorbidities and participant's personal goals.       Expected Outcomes  Short Term: Attend rehab on a regular basis to increase amount of physical activity.;Long Term: Add in home exercise to make exercise part of routine and to increase amount of physical activity.;Long Term: Exercising regularly at least 3-5 days a week.       Increase Strength and Stamina  Yes       Intervention  Provide advice, education, support and counseling about physical activity/exercise needs.;Develop an individualized exercise prescription for aerobic and resistive training based on initial evaluation findings, risk stratification, comorbidities and participant's personal goals.       Expected Outcomes  Short Term: Increase workloads from initial exercise prescription for resistance, speed, and METs.;Short Term: Perform resistance training exercises routinely during rehab and add  in resistance training at home;Long Term: Improve cardiorespiratory fitness, muscular endurance and strength as measured by increased METs and functional capacity (6MWT)       Able to understand and use rate of perceived exertion (RPE) scale  Yes       Intervention  Provide education and explanation on how to use RPE scale       Expected Outcomes  Short Term: Able to use RPE daily in rehab to express subjective intensity level;Long Term:  Able to use RPE to guide intensity level when exercising independently       Able to understand and use Dyspnea scale  Yes        Intervention  Provide education and explanation on how to use Dyspnea scale       Expected Outcomes  Short Term: Able to use Dyspnea scale daily in rehab to express subjective sense of shortness of breath during exertion;Long Term: Able to use Dyspnea scale to guide intensity level when exercising independently       Knowledge and understanding of Target Heart Rate Range (THRR)  Yes       Intervention  Provide education and explanation of THRR including how the numbers were predicted and where they are located for reference       Expected Outcomes  Short Term: Able to state/look up THRR;Short Term: Able to use daily as guideline for intensity in rehab;Long Term: Able to use THRR to govern intensity when exercising independently       Able to check pulse independently  Yes       Intervention  Provide education and demonstration on how to check pulse in carotid and radial arteries.;Review the importance of being able to check your own pulse for safety during independent exercise       Expected Outcomes  Short Term: Able to explain why pulse checking is important during independent exercise;Long Term: Able to check pulse independently and accurately       Understanding of Exercise Prescription  Yes       Intervention  Provide education, explanation, and written materials on patient's individual exercise prescription       Expected Outcomes  Short Term: Able to explain program exercise prescription;Long Term: Able to explain home exercise prescription to exercise independently          Exercise Goals Re-Evaluation : Exercise Goals Re-Evaluation    Row Name 05/08/18 0932 05/13/18 1511 05/27/18 1458 05/29/18 0946 06/03/18 0942     Exercise Goal Re-Evaluation   Exercise Goals Review  Increase Physical Activity;Able to understand and use rate of perceived exertion (RPE) scale;Understanding of Exercise Prescription;Knowledge and understanding of Target Heart Rate Range (THRR)  Increase Physical  Activity;Understanding of Exercise Prescription;Increase Strength and Stamina  -  Increase Physical Activity;Understanding of Exercise Prescription;Increase Strength and Stamina  Increase Physical Activity;Increase Strength and Stamina;Able to understand and use rate of perceived exertion (RPE) scale;Knowledge and understanding of Target Heart Rate Range (THRR);Able to check pulse independently;Understanding of Exercise Prescription   Comments  Reviewed RPE scale, THR and program prescription with pt today.  Pt voiced understanding and was given a copy of goals to take home.   Derwood is off to a good start in rehab.  He is already getting 3 METs on the NuStep.  We will start to increase his workloads and continue to monitor his progress.   Out since last review  Demauri returned to exercise today after a trip home.  He continues to feel good.  His ultimate goal is to get back on his bike.  He does feel like he is starting to get his strength back and should be able to handle his bike once cleared by surgeon to ride.    He does get is some exercise at home on his off but has not really been tracking it much.  We will review home exercise guidelines soon.   Reviewed home exercise with pt today.  Pt plans to walk outside and use DB for exercise.  Reviewed THR, pulse, RPE, sign and symptoms, NTG use, and when to call 911 or MD.  Also discussed weather considerations and indoor options.  Pt voiced understanding.   Expected Outcomes  Short: Use RPE daily to regulate intensity.  Long: Follow program prescription in THR.  Short: Increase workloads and review home exercise.  Long: Continue to work on Education administrator.   -  Short: Review home exercise guidelines.  Long: Continue to build strength to ride bike.   Short - continue to exercise on days not at [facility] Long - maintain exercise after completing HT   Row Name 06/12/18 0827 07/01/18 1011           Exercise Goal Re-Evaluation   Exercise Goals Review   Increase Physical Activity;Understanding of Exercise Prescription;Increase Strength and Stamina  Increase Physical Activity;Increase Strength and Stamina;Understanding of Exercise Prescription      Comments  Diontae has been doing well in rehab.  He continues do what he wants and is not motivated to increase workloads.  He will be switching class to our new 900 class next week. He was able to finally get back on his bike again!!  We will continue to monitor his progression.  Refoel has been doing well in rehab.  He has still been able to ride his bike some, however, with the heat this week he has not been able to get out.  He has been walking at home and walking in pool too.  He is feeling stronger and has more stamina.       Expected Outcomes  Short: Continue to exercise at home and try to increase workloads. Long: Continue to exercise independently  Short: Continue to walk at home.  Long: Continue to regain strength and stamina.          Discharge Exercise Prescription (Final Exercise Prescription Changes): Exercise Prescription Changes - 06/12/18 0800      Response to Exercise   Blood Pressure (Admit)  110/64    Blood Pressure (Exercise)  120/68    Blood Pressure (Exit)  124/64    Heart Rate (Admit)  61 bpm    Heart Rate (Exercise)  106 bpm    Heart Rate (Exit)  78 bpm    Rating of Perceived Exertion (Exercise)  12    Symptoms  none    Duration  Continue with 30 min of aerobic exercise without signs/symptoms of physical distress.    Intensity  THRR unchanged      Progression   Progression  Continue to progress workloads to maintain intensity without signs/symptoms of physical distress.    Average METs  2.28      Resistance Training   Training Prescription  Yes    Weight  3 lbs    Reps  10-15      Interval Training   Interval Training  No      Treadmill   MPH  1.5    Grade  0    Minutes  15    METs  2.15      NuStep   Level  2    Minutes  15    METs  2.9      Arm Ergometer    Level  1    Minutes  15    METs  1.9      Home Exercise Plan   Plans to continue exercise at  Home (comment)    Frequency  Add 2 additional days to program exercise sessions.    Initial Home Exercises Provided  06/03/18       Nutrition:  Target Goals: Understanding of nutrition guidelines, daily intake of sodium <1531m, cholesterol <2069m calories 30% from fat and 7% or less from saturated fats, daily to have 5 or more servings of fruits and vegetables.  Biometrics: Pre Biometrics - 05/01/18 1404      Pre Biometrics   Height  _0  (1.651 m)    Weight  163 lb 12.8 oz (74.3 kg)    Waist Circumference  38.5 inches    Hip Circumference  38.5 inches    Waist to Hip Ratio  1 %    BMI (Calculated)  27.26    Single Leg Stand  27.92 seconds        Nutrition Therapy Plan and Nutrition Goals: Nutrition Therapy & Goals - 06/09/18 0911      Nutrition Therapy   RD appointment deferred  Yes       Nutrition Assessments: Nutrition Assessments - 05/01/18 1155      MEDFICTS Scores   Pre Score  34       Nutrition Goals Re-Evaluation: Nutrition Goals Re-Evaluation    Row Name 05/29/18 0951 07/01/18 1017           Goals   Current Weight  166 lb (75.3 kg)  -      Nutrition Goal  Meet with dietician  Continue to eat healthy      Comment  RaJarelleould like to work on gaining muscle weight and staying lean.  He has a pretty good diet.  We made him an appointment to meet with dietician after class next Thursday (6/20).    RaZaharieclined meeting with LiLattie Hawuring class to set goals.  He continues to focus on healthy eating and getting fruits and vegetables.  He has tried the veggie pastas and really likes them. He is using the air fryer more and more.        Expected Outcome  Short: Meet with dietician.  Long: Continue to follow recommendations to gain muscle weight  Short: Continue to try new recipes.  Long: Continue to work on gaining weight.          Nutrition Goals Discharge  (Final Nutrition Goals Re-Evaluation): Nutrition Goals Re-Evaluation - 07/01/18 1017      Goals   Nutrition Goal  Continue to eat healthy    Comment  RaHussieneclined meeting with LiLattie Hawuring class to set goals.  He continues to focus on healthy eating and getting fruits and vegetables.  He has tried the veggie pastas and really likes them. He is using the air fryer more and more.      Expected Outcome  Short: Continue to try new recipes.  Long: Continue to work on gaining weight.        Psychosocial: Target Goals: Acknowledge presence or absence of significant depression and/or stress, maximize coping skills, provide positive support system. Participant is able to verbalize types and ability  to use techniques and skills needed for reducing stress and depression.   Initial Review & Psychosocial Screening: Initial Psych Review & Screening - 05/01/18 1448      Initial Review   Current issues with  None Identified      Family Dynamics   Good Support System?  -- Significant other  Family and Friends      Barriers   Psychosocial barriers to participate in program  The patient should benefit from training in stress management and relaxation.;There are no identifiable barriers or psychosocial needs.      Screening Interventions   Interventions  Encouraged to exercise;To provide support and resources with identified psychosocial needs;Provide feedback about the scores to participant    Expected Outcomes  Short Term goal: Utilizing psychosocial counselor, staff and physician to assist with identification of specific Stressors or current issues interfering with healing process. Setting desired goal for each stressor or current issue identified.;Long Term Goal: Stressors or current issues are controlled or eliminated.;Short Term goal: Identification and review with participant of any Quality of Life or Depression concerns found by scoring the questionnaire.;Long Term goal: The participant improves  quality of Life and PHQ9 Scores as seen by post scores and/or verbalization of changes       Quality of Life Scores:  Quality of Life - 05/01/18 1153      Quality of Life Scores   Health/Function Pre  27.83 %    Socioeconomic Pre  27.86 %    Psych/Spiritual Pre  30 %    Family Pre  30 %    GLOBAL Pre  28.6 %      Scores of 19 and below usually indicate a poorer quality of life in these areas.  A difference of  2-3 points is a clinically meaningful difference.  A difference of 2-3 points in the total score of the Quality of Life Index has been associated with significant improvement in overall quality of life, self-image, physical symptoms, and general health in studies assessing change in quality of life.  PHQ-9: Recent Review Flowsheet Data    Depression screen [facility] 2/9 05/01/2018   Decreased Interest 0   Down, Depressed, Hopeless 0   PHQ - 2 Score 0   Altered sleeping 0   Tired, decreased energy 0   Change in appetite 0   Feeling bad or failure about yourself  0   Trouble concentrating 0   Moving slowly or fidgety/restless 0   Suicidal thoughts 0   PHQ-9 Score 0   Difficult doing work/chores Not difficult at all     Interpretation of Total Score  Total Score Depression Severity:  1-4 = Minimal depression, 5-9 = Mild depression, 10-14 = Moderate depression, 15-19 = Moderately severe depression, 20-27 = Severe depression   Psychosocial Evaluation and Intervention: Psychosocial Evaluation - 05/08/18 0934      Psychosocial Evaluation & Interventions   Interventions  Encouraged to exercise with the program and follow exercise prescription    Comments  Counselor met with Mr. Iseman Mccanless) today for initial psychosocial evaluation.  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  Jacody has a strong support system with a fiance and (3) daughters he speaks with daily as the daughters are in [place].  Noeh reports sleeping better since his surgery and having a good appetite.  He  denies a history of depression or anxiety other than some symptoms when his spouse passed 4 years ago; he denies any current symptoms.  Javid states he is typically positive and has minimal stress in his life.  He has goals to be healthy enough to get back on his motorcycle and drive to [place] to visit family.  Staff will follow with Deidre Ala.    Expected Outcomes  Short:  Haytham will exercise consistently to increase his stamin and strength.   Long:  Makell will be strong enough to ride his motorcycle to [place] to visit family.     Continue Psychosocial Services   Follow up required by staff       Psychosocial Re-Evaluation: Psychosocial Re-Evaluation    [place] Name 05/29/18 1660 07/01/18 1015           Psychosocial Re-Evaluation   Current issues with  None Identified  None Identified      Comments  Dailyn has been doing well in rehab.  He has minimal stress in his life.  He really wants to get back on his bike.  He has an appointment with Dr. Roxy Manns on 6/24 to talk about this.  He feels that he has regained enough strength to ride again. After his trip, he also now has his own car back which makes him happier to have the freedom back.  He sleeps well and gets about 6 hours of sleep a night.  Javen has been able to return to riding his bike (except this week with weather).  He is also restarted his supplements and using his CBD oil again.  Thus he is sleeping better again.  He conintues to maintain status quo and a postive attitude.  The heat of summer prevents him from doing what he wants to do outside.       Expected Outcomes  Short: Talk to doctor about getting back on bike.  Long: Continue to stay postive.   Short: Continue to get out to ride early in day.  Long: Continue to stay positive.       Interventions  -  Encouraged to attend Cardiac Rehabilitation for the exercise      Continue Psychosocial Services   -  Follow up required by staff         Psychosocial Discharge (Final Psychosocial  Re-Evaluation): Psychosocial Re-Evaluation - 07/01/18 1015      Psychosocial Re-Evaluation   Current issues with  None Identified    Comments  Amitai has been able to return to riding his bike (except this week with weather).  He is also restarted his supplements and using his CBD oil again.  Thus he is sleeping better again.  He conintues to maintain status quo and a postive attitude.  The heat of summer prevents him from doing what he wants to do outside.     Expected Outcomes  Short: Continue to get out to ride early in day.  Long: Continue to stay positive.     Interventions  Encouraged to attend Cardiac Rehabilitation for the exercise    Continue Psychosocial Services   Follow up required by staff       Vocational Rehabilitation: Provide vocational rehab assistance to qualifying candidates.   Vocational Rehab Evaluation & Intervention: Vocational Rehab - 05/01/18 1450      Initial Vocational Rehab Evaluation & Intervention   Assessment shows need for Vocational Rehabilitation  No       Education: Education Goals: Education classes will be provided on a variety of topics geared toward better understanding of heart health and risk factor modification. Participant will state understanding/return demonstration of topics presented as noted  by education test scores.  Learning Barriers/Preferences: Learning Barriers/Preferences - 05/01/18 1449      Learning Barriers/Preferences   Learning Barriers  None    Learning Preferences  None       Education Topics:  AED/CPR: - Group verbal and written instruction with the use of models to demonstrate the basic use of the AED with the basic ABC's of resuscitation.   Cardiac Rehab from 07/01/2018 in [facility] Cardiac and Pulmonary Rehab  Date  05/08/18  Educator  CE  Instruction Review Code  1- Verbalizes Understanding      General Nutrition Guidelines/Fats and Fiber: -Group instruction provided by verbal, written material, models and  posters to present the general guidelines for heart healthy nutrition. Gives an explanation and review of dietary fats and fiber.   Cardiac Rehab from 07/01/2018 in [facility] Cardiac and Pulmonary Rehab  Date  07/01/18  Educator  [facility]  Instruction Review Code  1- Verbalizes Understanding      Controlling Sodium/Reading Food Labels: -Group verbal and written material supporting the discussion of sodium use in heart healthy nutrition. Review and explanation with models, verbal and written materials for utilization of the food label.   Cardiac Rehab from 07/01/2018 in [facility] Cardiac and Pulmonary Rehab  Date  05/13/18  Educator  PI  Instruction Review Code  1- Verbalizes Understanding      Exercise Physiology & General Exercise Guidelines: - Group verbal and written instruction with models to review the exercise physiology of the cardiovascular system and associated critical values. Provides general exercise guidelines with specific guidelines to those with heart or lung disease.    Cardiac Rehab from 07/01/2018 in [facility] Cardiac and Pulmonary Rehab  Date  05/20/18  Educator  AS  Instruction Review Code  4- No Evidence of Learning [charting in error]      Aerobic Exercise & Resistance Training: - Gives group verbal and written instruction on the various components of exercise. Focuses on aerobic and resistive training programs and the benefits of this training and how to safely progress through these programs..   Cardiac Rehab from 07/01/2018 in [facility] Cardiac and Pulmonary Rehab  Date  06/03/18  Educator  [facility]  Instruction Review Code  1- Verbalizes Understanding      Flexibility, Balance, Mind/Body Relaxation: Provides group verbal/written instruction on the benefits of flexibility and balance training, including mind/body exercise modes such as yoga, pilates and tai chi.  Demonstration and skill practice provided.   Cardiac Rehab from 07/01/2018 in [facility] Cardiac and Pulmonary Rehab  Date  06/05/18   Educator  AS  Instruction Review Code  1- Verbalizes Understanding      Stress and Anxiety: - Provides group verbal and written instruction about the health risks of elevated stress and causes of high stress.  Discuss the correlation between heart/lung disease and anxiety and treatment options. Review healthy ways to manage with stress and anxiety.   Cardiac Rehab from 07/01/2018 in [facility] Cardiac and Pulmonary Rehab  Date  06/24/18  Educator  [facility]  Instruction Review Code  1- Verbalizes Understanding      Depression: - Provides group verbal and written instruction on the correlation between heart/lung disease and depressed mood, treatment options, and the stigmas associated with seeking treatment.   Anatomy & Physiology of the Heart: - Group verbal and written instruction and models provide basic cardiac anatomy and physiology, with the coronary electrical and arterial systems. Review of Valvular disease and Heart Failure   Cardiac Procedures: - Group verbal and  written instruction to review commonly prescribed medications for heart disease. Reviews the medication, class of the drug, and side effects. Includes the steps to properly store meds and maintain the prescription regimen. (beta blockers and nitrates)   Cardiac Medications I: - Group verbal and written instruction to review commonly prescribed medications for heart disease. Reviews the medication, class of the drug, and side effects. Includes the steps to properly store meds and maintain the prescription regimen.   Cardiac Rehab from 07/01/2018 in [facility] Cardiac and Pulmonary Rehab  Date  06/10/18  Educator  SB  Instruction Review Code  1- Verbalizes Understanding      Cardiac Medications II: -Group verbal and written instruction to review commonly prescribed medications for heart disease. Reviews the medication, class of the drug, and side effects. (all other drug classes)   Cardiac Rehab from 07/01/2018 in [facility] Cardiac and  Pulmonary Rehab  Date  05/29/18  Educator  CE  Instruction Review Code  1- Verbalizes Understanding       Go Sex-Intimacy & Heart Disease, Get SMART - Goal Setting: - Group verbal and written instruction through game format to discuss heart disease and the return to sexual intimacy. Provides group verbal and written material to discuss and apply goal setting through the application of the S.M.A.R.T. Method.   Other Matters of the Heart: - Provides group verbal, written materials and models to describe Stable Angina and Peripheral Artery. Includes description of the disease process and treatment options available to the cardiac patient.   Exercise & Equipment Safety: - Individual verbal instruction and demonstration of equipment use and safety with use of the equipment.   Cardiac Rehab from 07/01/2018 in [facility] Cardiac and Pulmonary Rehab  Date  05/01/18  Educator  [facility]  Instruction Review Code  1- Verbalizes Understanding      Infection Prevention: - Provides verbal and written material to individual with discussion of infection control including proper hand washing and proper equipment cleaning during exercise session.   Cardiac Rehab from 07/01/2018 in [facility] Cardiac and Pulmonary Rehab  Date  05/01/18  Educator  mc  Instruction Review Code  1- Verbalizes Understanding      Falls Prevention: - Provides verbal and written material to individual with discussion of falls prevention and safety.   Cardiac Rehab from 07/01/2018 in [facility] Cardiac and Pulmonary Rehab  Date  05/01/18  Educator  mc  Instruction Review Code  1- Verbalizes Understanding      Diabetes: - Individual verbal and written instruction to review signs/symptoms of diabetes, desired ranges of glucose level fasting, after meals and with exercise. Acknowledge that pre and post exercise glucose checks will be done for 3 sessions at entry of program.   Cardiac Rehab from 07/01/2018 in [facility] Cardiac and Pulmonary Rehab  Date   05/01/18  Educator  [facility]  Instruction Review Code  1- Verbalizes Understanding      Know Your Numbers and Risk Factors: -Group verbal and written instruction about important numbers in your health.  Discussion of what are risk factors and how they play a role in the disease process.  Review of Cholesterol, Blood Pressure, Diabetes, and BMI and the role they play in your overall health.   Cardiac Rehab from 07/01/2018 in [facility] Cardiac and Pulmonary Rehab  Date  05/29/18  Educator  CE  Instruction Review Code  1- Verbalizes Understanding      Sleep Hygiene: -Provides group verbal and written instruction about how sleep can affect your health.  Define  sleep hygiene, discuss sleep cycles and impact of sleep habits. Review good sleep hygiene tips.    Other: -Provides group and verbal instruction on various topics (see comments)   Knowledge Questionnaire Score: Knowledge Questionnaire Score - 05/01/18 1152      Knowledge Questionnaire Score   Pre Score  26/28 Reviewed correct responses with Deidre Ala today. He verbalized understanding and had no further questions       Core Components/Risk Factors/Patient Goals at Admission: Personal Goals and Risk Factors at Admission - 05/01/18 1443      Core Components/Risk Factors/Patient Goals on Admission    Weight Management  Weight Maintenance;Yes    Intervention  Weight Management: Develop a combined nutrition and exercise program designed to reach desired caloric intake, while maintaining appropriate intake of nutrient and fiber, sodium and fats, and appropriate energy expenditure required for the weight goal.;Weight Management: Provide education and appropriate resources to help participant work on and attain dietary goals.    Admit Weight  163 lb 12.8 oz (74.3 kg) Was in hospital for over 4 weeks, loos muscle mass and weight. Wants to regain muscle mass.     Goal Weight: Short Term  165 lb (74.8 kg)    Goal Weight: Long Term  170 lb (77.1 kg)     Expected Outcomes  Short Term: Continue to assess and modify interventions until short term weight is achieved;Long Term: Adherence to nutrition and physical activity/exercise program aimed toward attainment of established weight goal;Weight Maintenance: Understanding of the daily nutrition guidelines, which includes 25-35% calories from fat, 7% or less cal from saturated fats, less than 234m cholesterol, less than 1.5gm of sodium, & 5 or more servings of fruits and vegetables daily    Diabetes  Yes    Intervention  Provide education about signs/symptoms and action to take for hypo/hyperglycemia.;Provide education about proper nutrition, including hydration, and aerobic/resistive exercise prescription along with prescribed medications to achieve blood glucose in normal ranges: Fasting glucose 65-99 mg/dL    Expected Outcomes  Short Term: Participant verbalizes understanding of the signs/symptoms and immediate care of hyper/hypoglycemia, proper foot care and importance of medication, aerobic/resistive exercise and nutrition plan for blood glucose control.;Long Term: Attainment of HbA1C < 7%.    Hypertension  Yes    Intervention  Provide education on lifestyle modifcations including regular physical activity/exercise, weight management, moderate sodium restriction and increased consumption of fresh fruit, vegetables, and low fat dairy, alcohol moderation, and smoking cessation.;Monitor prescription use compliance.    Expected Outcomes  Short Term: Continued assessment and intervention until BP is < 140/957mHG in hypertensive participants. < 130/8068mG in hypertensive participants with diabetes, heart failure or chronic kidney disease.;Long Term: Maintenance of blood pressure at goal levels.    Lipids  Yes    Intervention  Provide education and support for participant on nutrition & aerobic/resistive exercise along with prescribed medications to achieve LDL <31m8mDL >40mg63m Expected Outcomes  Short  Term: Participant states understanding of desired cholesterol values and is compliant with medications prescribed. Participant is following exercise prescription and nutrition guidelines.;Long Term: Cholesterol controlled with medications as prescribed, with individualized exercise RX and with personalized nutrition plan. Value goals: LDL < 31mg,59m > 40 mg.    Personal Goal Other  Yes    Personal Goal  Wants to get back to riding his [employer] muscle mass will in the hospital for over 4 weeks    Intervention  Provide exercise prescription to work on [employer]  gain, strength and stamina    Expected Outcomes  ST: Urias will start to build his muscle strength and stamina while following his exercise prescription. RF:XJOIT will be able to ride his Markus Daft again without concern       Core Components/Risk Factors/Patient Goals Review:  Goals and Risk Factor Review    Row Name 05/29/18 0948 07/01/18 1013           Core Components/Risk Factors/Patient Goals Review   Personal Goals Review  Weight Management/Obesity;Lipids;Hypertension;Diabetes  Weight Management/Obesity;Lipids;Hypertension;Diabetes      Review  Carlie has been trying to gain muscle weight back.  He is up to 166 lbs and feeling stronger.  His blood pressures have been doing well and he checks them on occasion at home.  We talked about increase the frequency he checks his pressures at home.  Taiga's blood sugars have been doing well.  Today he was 147 mg/dl fasting.  He checks it daily in the morning.  He continues to do well on his medications and would like to get back on CBD oil and other supplements.  He has an appointmnent with Dr. Roxy Manns on the 24th and will ask him about those then.    Cyruss is doing well on his blood pressure and still checks them routinely at home. He continues to check his blood sugars at home as well.  He still gaining weight, he has about five more to go to get back to where he wants to be.  He is still doing  well on his medications and has been able to restart his supplements and CBD oil. He is also sleeping better again.       Expected Outcomes  Short: Talk to doctor about CBD oil and check blood pressures more frequently.  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  Short: Contiue to work on Producer, television/film/video.  Long: Continue to monitor blood pressures and blood sugars regularly.          Core Components/Risk Factors/Patient Goals at Discharge (Final Review):  Goals and Risk Factor Review - 07/01/18 1013      Core Components/Risk Factors/Patient Goals Review   Personal Goals Review  Weight Management/Obesity;Lipids;Hypertension;Diabetes    Review  Antwain is doing well on his blood pressure and still checks them routinely at home. He continues to check his blood sugars at home as well.  He still gaining weight, he has about five more to go to get back to where he wants to be.  He is still doing well on his medications and has been able to restart his supplements and CBD oil. He is also sleeping better again.     Expected Outcomes  Short: Contiue to work on gaining muscle.  Long: Continue to monitor blood pressures and blood sugars regularly.        ITP Comments: ITP Comments    Row Name 05/01/18 1155 05/07/18 0608 05/27/18 1458 06/04/18 0608 07/02/18 0555   ITP Comments  Medical review complete, ITP sent to Dr Loleta Chance for review,changes as needed and signature.  Documentation of diagnosis can be found in [facility] 02/07/2018 admission  30 day review. Continue with ITP unless directed changes per Medical Director  New to program  Shaarav has been out on vacation for the last two weeks.   30 day review. Continue with ITP unless directed changes per Medical Director review  30 day review. Continue with ITP unless directed changes per Medical Director review.  1session in July  Comments: 30 day review. Continue with ITP unless directed changes per Medical Director review.

## 2018-07-16 ENCOUNTER — Telehealth: Payer: Self-pay | Admitting: *Deleted

## 2018-07-16 ENCOUNTER — Encounter: Payer: Self-pay | Admitting: *Deleted

## 2018-07-16 DIAGNOSIS — Z952 Presence of prosthetic heart valve: Secondary | ICD-10-CM

## 2018-07-16 DIAGNOSIS — Z951 Presence of aortocoronary bypass graft: Secondary | ICD-10-CM

## 2018-07-16 NOTE — Telephone Encounter (Signed)
Harold Parker has been out since 7/16.  Called to check up on him. He is preparing to move and hopes to return on Tuesday next week.

## 2018-07-17 ENCOUNTER — Encounter: Payer: Medicare Other | Attending: Cardiovascular Disease

## 2018-07-17 DIAGNOSIS — Z951 Presence of aortocoronary bypass graft: Secondary | ICD-10-CM | POA: Insufficient documentation

## 2018-07-17 DIAGNOSIS — Z48812 Encounter for surgical aftercare following surgery on the circulatory system: Secondary | ICD-10-CM | POA: Insufficient documentation

## 2018-07-17 DIAGNOSIS — Z952 Presence of prosthetic heart valve: Secondary | ICD-10-CM | POA: Insufficient documentation

## 2018-07-23 IMAGING — DX DG CHEST 1V PORT
1 series · 1 of 1 positions shown · non-contrast
Comparison: Chest radiograph dated 02/22/2018

CLINICAL DATA: 71-year-old male with shortness of breath.

EXAM:
PORTABLE CHEST 1 VIEW

[chest ap]
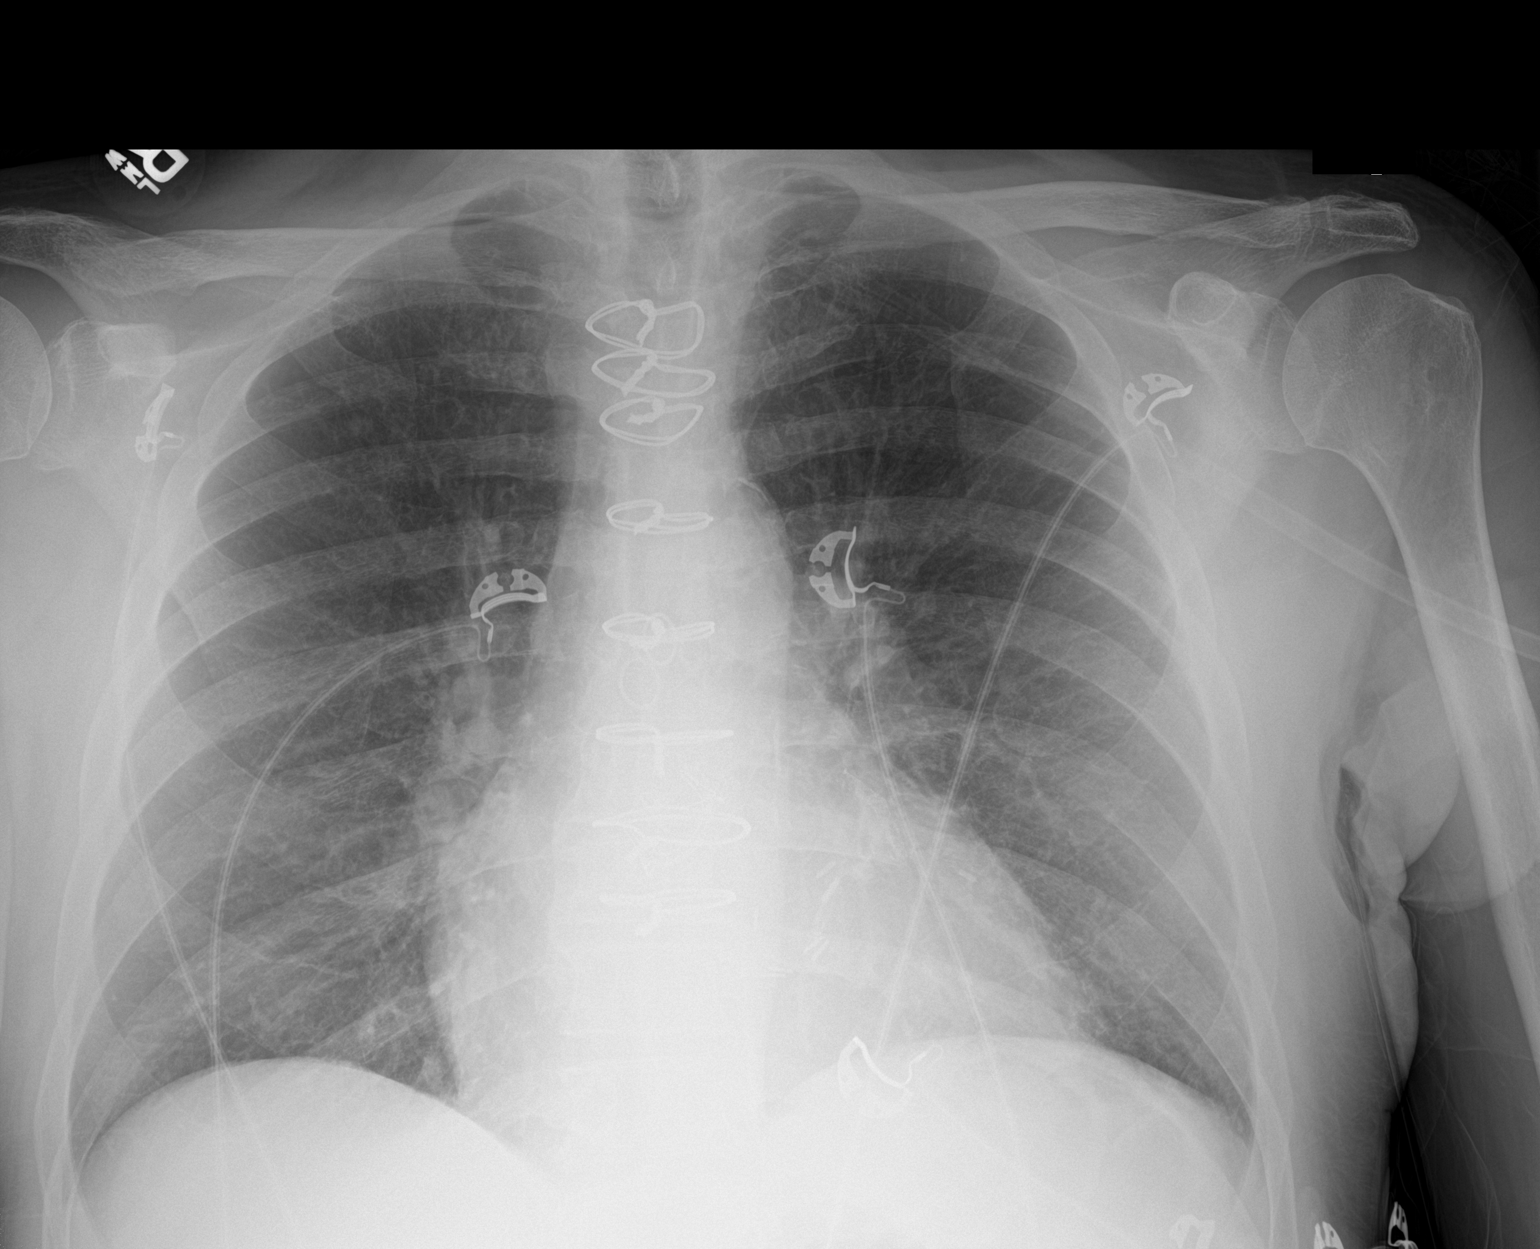

[1 of 1 positions shown; findings below may reference images not displayed]

FINDINGS: The lungs are clear. There is no pleural effusion or pneumothorax.
The cardiac silhouette is within normal limits. Median sternotomy
wires, CABG vascular clips, and coronary stent noted. No acute
osseous pathology.
IMPRESSION: No active disease.

## 2018-07-24 ENCOUNTER — Encounter: Payer: Medicare Other | Admitting: *Deleted

## 2018-07-24 DIAGNOSIS — Z951 Presence of aortocoronary bypass graft: Secondary | ICD-10-CM | POA: Diagnosis not present

## 2018-07-24 DIAGNOSIS — Z952 Presence of prosthetic heart valve: Secondary | ICD-10-CM | POA: Diagnosis not present

## 2018-07-24 DIAGNOSIS — Z48812 Encounter for surgical aftercare following surgery on the circulatory system: Secondary | ICD-10-CM | POA: Diagnosis not present

## 2018-07-24 NOTE — Progress Notes (Signed)
Daily Session Note  Patient Details  Name: Harold Parker MRN: [id number] Date of Birth: March 04, 1946 Referring Provider:     Cardiac Rehab from 05/01/2018 in [facility] Cardiac and Pulmonary Rehab  Referring Provider  Fletcher Anon      Encounter Date: 07/24/2018  Check In: Session Check In - 07/24/18 0915      Check-In   Supervising physician immediately available to respond to emergencies  See telemetry face sheet for immediately available ER MD    Location  [facility]-Cardiac & Pulmonary Rehab    Staff Present  Renita Papa, RN BSN;Joseph Hood RCP,RRT,BSRT;Mandi Craig, BS, [facility]    Medication changes reported      No    Fall or balance concerns reported     No    Warm-up and Cool-down  Performed on first and last piece of equipment    Resistance Training Performed  Yes    VAD Patient?  No    PAD/SET Patient?  No      Pain Assessment   Currently in Pain?  No/denies    Multiple Pain Sites  No          Social History   Tobacco Use  Smoking Status Former Smoker  . Packs/day: 1.00  . Years: 15.00  . Pack years: 15.00  . Types: Cigarettes  . Last attempt to quit: 02/02/2018  . Years since quitting: 0.4  Smokeless Tobacco Never Used  Tobacco Comment   Quit 01/2018 - on 05/01/2018 talked to [name] about Relaspe concerns. He ststaes that he has no taste for tobacco since he quit in Feb.    Goals Met:  Independence with exercise equipment Exercise tolerated well No report of cardiac concerns or symptoms Strength training completed today  Goals Unmet:  Not Applicable  Comments: Pt able to follow exercise prescription today without complaint.  Will continue to monitor for progression. We reviewed his personal goals today.     Dr. Emily Filbert is Medical Director for [place] and [facility].

## 2018-07-25 DIAGNOSIS — H04123 Dry eye syndrome of bilateral lacrimal glands: Secondary | ICD-10-CM | POA: Diagnosis not present

## 2018-07-28 ENCOUNTER — Telehealth: Payer: Self-pay | Admitting: Cardiovascular Disease

## 2018-07-28 NOTE — Telephone Encounter (Signed)
I agree with your evaluation.  It might be anxiety related. I recommend that he continues with cardiac rehab and see how he feels.  His symptoms should gradually improve.

## 2018-07-28 NOTE — Telephone Encounter (Signed)
I spoke with the patient. He states that he has been having some intermittent leg weakness with certain positions, but his bigger concern is intermittent SOB. He states that he has noticed this more over the last week. He denies changes in edema/ weight. He describes his SOB as more that he is having trouble getting a good breath in at times. This can vary throughout the day with walking, sitting, or lying, but can happen 2-3 times per day.  He will check his O2 level at the time and this will be ok.  He states he has had some anxiety as he has been moving from [place] and is finally down her permanently. He states that he and his wife have been driving back and forth, but they will split up the 9-10 hour drive.  He states that if he feels SOB with walking, he rest and this resolves for him.  I have advised the patient that his symptoms at this time may be more related to anxiety, but I will forward to Dr. Kirke Corin for review and any further recommendations and we will call him back. He is agreeable and voices understanding.

## 2018-07-28 NOTE — Telephone Encounter (Signed)
Patient states he recently has been having trouble breathing and legs are getting weak Please call to discuss

## 2018-07-29 ENCOUNTER — Encounter: Payer: Medicare Other | Admitting: *Deleted

## 2018-07-29 DIAGNOSIS — Z951 Presence of aortocoronary bypass graft: Secondary | ICD-10-CM

## 2018-07-29 DIAGNOSIS — Z952 Presence of prosthetic heart valve: Secondary | ICD-10-CM

## 2018-07-29 DIAGNOSIS — Z48812 Encounter for surgical aftercare following surgery on the circulatory system: Secondary | ICD-10-CM | POA: Diagnosis not present

## 2018-07-29 NOTE — Progress Notes (Signed)
Daily Session Note  Patient Details  Name: Damir Leung MRN: [id number] Date of Birth: [birth year] Referring Provider:     Cardiac Rehab from 05/01/2018 in [facility] Cardiac and Pulmonary Rehab  Referring Provider  Fletcher Anon      Encounter Date: 07/29/2018  Check In: Session Check In - 07/29/18 0916      Check-In   Supervising physician immediately available to respond to emergencies  See telemetry face sheet for immediately available ER MD    Location  [facility]-Cardiac & Pulmonary Rehab    Staff Present  Heath Lark, RN, BSN, CCRP;Mandi [place], BS, PEC;Brianna Esson [place], [place], [place], CCRP, Exercise Physiologist    Medication changes reported      No    Fall or balance concerns reported     No    Warm-up and Cool-down  Performed on first and last piece of equipment    Resistance Training Performed  Yes    VAD Patient?  No    PAD/SET Patient?  No      Pain Assessment   Currently in Pain?  No/denies          Social History   Tobacco Use  Smoking Status Former Smoker  . Packs/day: 1.00  . Years: 15.00  . Pack years: 15.00  . Types: Cigarettes  . Last attempt to quit: 02/02/2018  . Years since quitting: 0.4  Smokeless Tobacco Never Used  Tobacco Comment   Quit 01/2018 - on 05/01/2018 talked to Haroun about Relaspe concerns. He ststaes that he has no taste for tobacco since he quit in Feb.    Goals Met:  Independence with exercise equipment Exercise tolerated well No report of cardiac concerns or symptoms Strength training completed today  Goals Unmet:  Not Applicable  Comments: Pt able to follow exercise prescription today without complaint.  Will continue to monitor for progression.    Dr. Emily Filbert is Medical Director for [place] and [facility].

## 2018-07-30 ENCOUNTER — Encounter: Payer: Self-pay | Admitting: *Deleted

## 2018-07-30 DIAGNOSIS — Z951 Presence of aortocoronary bypass graft: Secondary | ICD-10-CM

## 2018-07-30 DIAGNOSIS — Z952 Presence of prosthetic heart valve: Secondary | ICD-10-CM

## 2018-07-30 NOTE — Progress Notes (Signed)
Cardiac Individual Treatment Plan  Patient Details  Name: Harold Parker MRN: [id number] Date of Birth: 1946/07/03 Referring Provider:     Cardiac Rehab from 05/01/2018 in [facility] Cardiac and Pulmonary Rehab  Referring Provider  Arida      Initial Encounter Date:    Cardiac Rehab from 05/01/2018 in [facility] Cardiac and Pulmonary Rehab  Date  05/01/18      Visit Diagnosis: S/P CABG x 3  S/P aortic valve replacement  Patient's Home Medications on Admission:  Current Outpatient Medications:  .  acetaminophen (TYLENOL) 325 MG tablet, Take 650 mg by mouth at bedtime., Disp: , Rfl:  .  ALPRAZolam (XANAX) 0.5 MG tablet, Take 0.5 mg by mouth at bedtime as needed for anxiety., Disp: , Rfl:  .  Ascorbic Acid (VITAMIN C) 1000 MG tablet, Take 1,000 mg by mouth daily., Disp: , Rfl:  .  aspirin EC 81 MG tablet, Take 81 mg by mouth daily., Disp: , Rfl:  .  atorvastatin (LIPITOR) 80 MG tablet, TAKE 1 TABLET BY MOUTH DAILY AT 6 PM, Disp: 30 tablet, Rfl: 0 .  esomeprazole (NEXIUM) 40 MG capsule, Take 40 mg by mouth daily at 12 noon., Disp: , Rfl:  .  ezetimibe (ZETIA) 10 MG tablet, Take 10 mg by mouth daily., Disp: , Rfl:  .  folic acid (FOLVITE) 409 MCG tablet, Take 400 mcg by mouth every evening., Disp: , Rfl:  .  furosemide (LASIX) 40 MG tablet, Take 1 tablet (40 mg) by mouth once daily, Disp: 90 tablet, Rfl: 3 .  glipiZIDE (GLUCOTROL) 5 MG tablet, Take 0.5 tablets (2.5 mg total) by mouth daily before breakfast., Disp: 30 tablet, Rfl: 1 .  ipratropium-albuterol (DUONEB) 0.5-2.5 (3) MG/3ML SOLN, Take 3 mLs by nebulization every 6 (six) hours as needed., Disp: 360 mL, Rfl: 1 .  losartan (COZAAR) 100 MG tablet, Take 1 tablet (100 mg total) by mouth daily., Disp: 90 tablet, Rfl: 3 .  metFORMIN (GLUCOPHAGE) 1000 MG tablet, TAKE 1 TABLET BY MOUTH TWICE DAILY WITH A MEAL, Disp: 60 tablet, Rfl: 0 .  metoprolol tartrate (LOPRESSOR) 50 MG tablet, TAKE 1 TABLET BY MOUTH TWICE DAILY, Disp: 60 tablet, Rfl: 0 .   Vitamin D, Cholecalciferol, 1000 units TABS, Take 1 tablet by mouth every morning. , Disp: , Rfl:   Past Medical History: Past Medical History:  Diagnosis Date  . Bilateral carotid bruits    a. 01/2018 U/S: < 50% bilat ICA stenosis.  Marland Kitchen CAD (coronary artery disease)    a. 1998 s/p MI and BMS [facility], [place]); b. 1999 redo PCI/rotablator in setting of what sounds like ISR;  c. Multiple stress tests over the years - last ~ 2017, reportedly nl; d. 01/2018 NSTEMI/Cath: LM 45md, LAD 50p, 40p/m, D1 60ost, OM1 95, RCA 100ost/p w/ L->R collats, EF 45%; e. s/p 3V CABG 02/19/18 (LIMA-LAD, VG-D1, VG-OM)  . Chronic lower back pain   . [place]   . GIB (gastrointestinal bleeding)    a. 02/2018 GIB and anemia w/ Hgb of 4.7 on presentation; b. 03/2018 EGD: 2 nonbleeding duodenal ulcers.  .Marland KitchenHTN (hypertension)   . Hypercholesteremia   . Ischemic cardiomyopathy    a. 01/2018 Echo: EF 40-45%, mid-apicalanteroseptal, ant, apical sev HK, mod apicalinferior HK. Gr2 DD. Mod AS, mild MR, mod dil LA, PASP 563mg.  . Moderate aortic stenosis    a. 01/2018 Echo: Mod AS, mean grad (S) 2070m, Valve area (VTI) 1.06 cm^2, (Vmax) 1.27 cm^2; b. s/p bioprosthetic  AVR 02/19/18.  . Myocardial infarction ([place]) ~ 1998/1999  . 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 02/19/2018 AVR: 25 mm Edwards Inspiris Resilia stented bovine pericardial tissue valve  . 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 Tobacco abuse   . Type II diabetes mellitus (HCC)     Tobacco Use: Social History   Tobacco Use  Smoking Status Former Smoker  . Packs/day: 1.00  . Years: 15.00  . Pack years: 15.00  . Types: Cigarettes  . Last attempt to quit: 02/02/2018  . Years since quitting: 0.4  Smokeless Tobacco Never Used  Tobacco Comment   Quit 01/2018 - on 05/01/2018 talked to [place] about [place] concerns. He ststaes that he has no taste  for tobacco since he quit in Feb.    Labs: Recent Review Flowsheet Data    Labs for ITP Cardiac and Pulmonary Rehab Latest Ref Rng & Units 02/19/2018 02/19/2018 02/20/2018 02/20/2018 03/27/2018   Cholestrol 100 - 199 mg/dL - - - - 102   LDLCALC 0 - 99 mg/dL - - - - 50   HDL >39 mg/dL - - - - 35(L)   Trlycerides 0 - 149 mg/dL - - - - 84   Hemoglobin A1c 4.8 - 5.6 % - - - - -   PHART 7.350 - 7.450 - 7.335(L) 7.361 7.331(L) -   PCO2ART 32.0 - 48.0 mmHg - 40.2 40.1 41.0 -   HCO3 20.0 - 28.0 mmol/L - 21.2 22.5 21.5 -   TCO2 22 - 32 mmol/L '24 22 24 23 '$ -   ACIDBASEDEF 0.0 - 2.0 mmol/L - 4.0(H) 2.0 4.0(H) -   O2SAT % - 97.0 97.0 95.0 -       Exercise Target Goals: Exercise Program Goal: Individual exercise prescription set using results from initial 6 min walk test and THRR while considering  patient's activity barriers and safety.   Exercise Prescription Goal: Initial exercise prescription builds to 30-45 minutes a day of aerobic activity, 2-3 days per week.  Home exercise guidelines will be given to patient during program as part of exercise prescription that the participant will acknowledge.  Activity Barriers & Risk Stratification: Activity Barriers & Cardiac Risk Stratification - 05/01/18 1450      Activity Barriers & Cardiac Risk Stratification   Activity Barriers  Back Problems   HIstory of back injury years ago.  He has lidocaine patches and epidurals as needed      6 Minute Walk: 6 Minute Walk    Row Name 05/01/18 1406         6 Minute Walk   Distance  888 feet     Walk Time  6 minutes     # of Rest Breaks  0     MPH  1.68     METS  2.04     RPE  11     Perceived Dyspnea   0     VO2 Peak  7.17     Symptoms  No     Resting HR  78 bpm     Resting BP  134/64     Resting Oxygen Saturation   94 %     Exercise Oxygen Saturation  during 6 min walk  94 %     Max Ex. HR  110 bpm     Max Ex. BP  126/60     2  Minute Post BP  116/68        Oxygen Initial  Assessment:   Oxygen Re-Evaluation:   Oxygen Discharge (Final Oxygen Re-Evaluation):   Initial Exercise Prescription: Initial Exercise Prescription - 05/01/18 1400      Date of Initial Exercise RX and Referring Provider   Date  05/01/18    Referring Provider  Arida      Treadmill   MPH  1.5    Grade  0    Minutes  15    METs  2.15      Recumbant Bike   Level  1    RPM  60    Watts  16    Minutes  15    METs  2      NuStep   Level  2    SPM  80    Minutes  15    METs  2      Prescription Details   Frequency (times per week)  2    Duration  Progress to 30 minutes of continuous aerobic without signs/symptoms of physical distress      Intensity   THRR 40-80% of Max Heartrate  105-133    Ratings of Perceived Exertion  11-13    Perceived Dyspnea  0-4      Resistance Training   Training Prescription  Yes    Weight  3 lb    Reps  10-15       Perform Capillary Blood Glucose checks as needed.  Exercise Prescription Changes: Exercise Prescription Changes    Row Name 05/01/18 1400 05/13/18 1500 06/03/18 0900 06/12/18 0800 07/10/18 1100     Response to Exercise   Blood Pressure (Admit)  134/64  132/64  132/64  110/64  116/62   Blood Pressure (Exercise)  126/60  128/60  128/60  120/68  144/72   Blood Pressure (Exit)  116/68  128/60  128/60  124/64  152/68   Heart Rate (Admit)  72 bpm  88 bpm  88 bpm  61 bpm  91 bpm   Heart Rate (Exercise)  93 bpm  94 bpm  94 bpm  106 bpm  104 bpm   Heart Rate (Exit)  63 bpm  90 bpm  90 bpm  78 bpm  81 bpm   Oxygen Saturation (Admit)  94 %  -  -  -  -   Rating of Perceived Exertion (Exercise)  '11  12  12  12  12   '$ Symptoms  -  none  none  none  none   Comments  -  second full day of exercise  second full day of exercise  -  -   Duration  -  Continue with 30 min of aerobic exercise without signs/symptoms of physical distress.  Continue with 30 min of aerobic exercise without signs/symptoms of physical distress.  Continue with 30  min of aerobic exercise without signs/symptoms of physical distress.  Continue with 30 min of aerobic exercise without signs/symptoms of physical distress.   Intensity  -  THRR unchanged  THRR unchanged  THRR unchanged  THRR unchanged     Progression   Progression  -  Continue to progress workloads to maintain intensity without signs/symptoms of physical distress.  Continue to progress workloads to maintain intensity without signs/symptoms of physical distress.  Continue to progress workloads to maintain intensity without signs/symptoms of physical distress.  Continue to progress workloads to maintain intensity without signs/symptoms of physical distress.   Average  METs  -  2.4  2.4  2.28  2.5     Resistance Training   Training Prescription  -  Yes  Yes  Yes  Yes   Weight  -  3 lbs  3 lbs  3 lbs  5 lbs   Reps  -  10-15  10-15  10-15  10-15     Interval Training   Interval Training  -  No  No  No  No     Treadmill   MPH  -  -  -  1.5  -   Grade  -  -  -  0  -   Minutes  -  -  -  15  -   METs  -  -  -  2.15  -     NuStep   Level  -  '2  2  2  2   '$ Minutes  -  '15  15  15  15   '$ METs  -  3  3  2.9  2.7     Arm Ergometer   Level  -  '1  1  1  1   '$ Minutes  -  '15  15  15  15   '$ METs  -  1.8  1.8  1.9  2.3     Home Exercise Plan   Plans to continue exercise at  -  -  Home (comment)  Home (comment)  Home (comment)   Frequency  -  -  Add 2 additional days to program exercise sessions.  Add 2 additional days to program exercise sessions.  Add 2 additional days to program exercise sessions.   Initial Home Exercises Provided  -  -  06/03/18  06/03/18  06/03/18   Row Name 07/24/18 1100             Response to Exercise   Blood Pressure (Admit)  134/56       Blood Pressure (Exit)  128/60       Heart Rate (Admit)  73 bpm       Heart Rate (Exercise)  80 bpm       Heart Rate (Exit)  75 bpm       Rating of Perceived Exertion (Exercise)  123       Symptoms  none       Comments  first day back  after 2 weeks        Duration  Continue with 30 min of aerobic exercise without signs/symptoms of physical distress.       Intensity  THRR unchanged         Progression   Progression  Continue to progress workloads to maintain intensity without signs/symptoms of physical distress.       Average METs  2.15         Resistance Training   Training Prescription  Yes       Weight  5 lbs       Reps  10-15         Interval Training   Interval Training  No         NuStep   Level  2       Minutes  15       METs  2.8         Arm Ergometer   Level  1       Minutes  15       METs  1.5  Home Exercise Plan   Plans to continue exercise at  Home (comment)       Frequency  Add 2 additional days to program exercise sessions.       Initial Home Exercises Provided  06/03/18          Exercise Comments: Exercise Comments    Row Name 05/08/18 0932 06/03/18 0942 07/10/18 1121       Exercise Comments  First full day of exercise!  Patient was oriented to gym and equipment including functions, settings, policies, and procedures.  Patient's individual exercise prescription and treatment plan were reviewed.  All starting workloads were established based on the results of the 6 minute walk test done at initial orientation visit.  The plan for exercise progression was also introduced and progression will be customized based on patient's performance and goals.  Reviewed home exercise with pt today.  Pt plans to walk outside and use DB for exercise.  Reviewed THR, pulse, RPE, sign and symptoms, NTG use, and when to call 911 or MD.  Also discussed weather considerations and indoor options.  Pt voiced understanding.  Pt. has been on vacation since 07/01/2018.        Exercise Goals and Review: Exercise Goals    Row Name 05/01/18 1405             Exercise Goals   Increase Physical Activity  Yes       Intervention  Provide advice, education, support and counseling about physical activity/exercise  needs.;Develop an individualized exercise prescription for aerobic and resistive training based on initial evaluation findings, risk stratification, comorbidities and participant's personal goals.       Expected Outcomes  Short Term: Attend rehab on a regular basis to increase amount of physical activity.;Long Term: Add in home exercise to make exercise part of routine and to increase amount of physical activity.;Long Term: Exercising regularly at least 3-5 days a week.       Increase Strength and Stamina  Yes       Intervention  Provide advice, education, support and counseling about physical activity/exercise needs.;Develop an individualized exercise prescription for aerobic and resistive training based on initial evaluation findings, risk stratification, comorbidities and participant's personal goals.       Expected Outcomes  Short Term: Increase workloads from initial exercise prescription for resistance, speed, and METs.;Short Term: Perform resistance training exercises routinely during rehab and add in resistance training at home;Long Term: Improve cardiorespiratory fitness, muscular endurance and strength as measured by increased METs and functional capacity (6MWT)       Able to understand and use rate of perceived exertion (RPE) scale  Yes       Intervention  Provide education and explanation on how to use RPE scale       Expected Outcomes  Short Term: Able to use RPE daily in rehab to express subjective intensity level;Long Term:  Able to use RPE to guide intensity level when exercising independently       Able to understand and use Dyspnea scale  Yes       Intervention  Provide education and explanation on how to use Dyspnea scale       Expected Outcomes  Short Term: Able to use Dyspnea scale daily in rehab to express subjective sense of shortness of breath during exertion;Long Term: Able to use Dyspnea scale to guide intensity level when exercising independently       Knowledge and  understanding of Target Heart Rate Range (THRR)  Yes       Intervention  Provide education and explanation of THRR including how the numbers were predicted and where they are located for reference       Expected Outcomes  Short Term: Able to state/look up THRR;Short Term: Able to use daily as guideline for intensity in rehab;Long Term: Able to use THRR to govern intensity when exercising independently       Able to check pulse independently  Yes       Intervention  Provide education and demonstration on how to check pulse in carotid and radial arteries.;Review the importance of being able to check your own pulse for safety during independent exercise       Expected Outcomes  Short Term: Able to explain why pulse checking is important during independent exercise;Long Term: Able to check pulse independently and accurately       Understanding of Exercise Prescription  Yes       Intervention  Provide education, explanation, and written materials on patient's individual exercise prescription       Expected Outcomes  Short Term: Able to explain program exercise prescription;Long Term: Able to explain home exercise prescription to exercise independently          Exercise Goals Re-Evaluation : Exercise Goals Re-Evaluation    Row Name 05/08/18 0932 05/13/18 1511 05/27/18 1458 05/29/18 0946 06/03/18 0942     Exercise Goal Re-Evaluation   Exercise Goals Review  Increase Physical Activity;Able to understand and use rate of perceived exertion (RPE) scale;Understanding of Exercise Prescription;Knowledge and understanding of Target Heart Rate Range (THRR)  Increase Physical Activity;Understanding of Exercise Prescription;Increase Strength and Stamina  -  Increase Physical Activity;Understanding of Exercise Prescription;Increase Strength and Stamina  Increase Physical Activity;Increase Strength and Stamina;Able to understand and use rate of perceived exertion (RPE) scale;Knowledge and understanding of Target Heart  Rate Range (THRR);Able to check pulse independently;Understanding of Exercise Prescription   Comments  Reviewed RPE scale, THR and program prescription with pt today.  Pt voiced understanding and was given a copy of goals to take home.   Nyxon is off to a good start in rehab.  He is already getting 3 METs on the NuStep.  We will start to increase his workloads and continue to monitor his progress.   Out since last review  Jag returned to exercise today after a trip home.  He continues to feel good.  His ultimate goal is to get back on his bike.  He does feel like he is starting to get his strength back and should be able to handle his bike once cleared by surgeon to ride.    He does get is some exercise at home on his off but has not really been tracking it much.  We will review home exercise guidelines soon.   Reviewed home exercise with pt today.  Pt plans to walk outside and use DB for exercise.  Reviewed THR, pulse, RPE, sign and symptoms, NTG use, and when to call 911 or MD.  Also discussed weather considerations and indoor options.  Pt voiced understanding.   Expected Outcomes  Short: Use RPE daily to regulate intensity.  Long: Follow program prescription in THR.  Short: Increase workloads and review home exercise.  Long: Continue to work on Education administrator.   -  Short: Review home exercise guidelines.  Long: Continue to build strength to ride bike.   Short - continue to exercise on days not at [facility] Long - maintain exercise after  completing HT   Row Name 06/12/18 0827 07/01/18 1011 07/24/18 1010         Exercise Goal Re-Evaluation   Exercise Goals Review  Increase Physical Activity;Understanding of Exercise Prescription;Increase Strength and Stamina  Increase Physical Activity;Increase Strength and Stamina;Understanding of Exercise Prescription  Increase Physical Activity;Increase Strength and Stamina;Understanding of Exercise Prescription     Comments  Loxley has been doing well in  rehab.  He continues do what he wants and is not motivated to increase workloads.  He will be switching class to our new 900 class next week. He was able to finally get back on his bike again!!  We will continue to monitor his progression.  Kellie has been doing well in rehab.  He has still been able to ride his bike some, however, with the heat this week he has not been able to get out.  He has been walking at home and walking in pool too.  He is feeling stronger and has more stamina.   Favio has been on vacaiton in [place] for 2 weeks to pack up his house. He stated that he walked everyday while he was there. He is glad to be back and get back in the routine of exercising.      Expected Outcomes  Short: Continue to exercise at home and try to increase workloads. Long: Continue to exercise independently  Short: Continue to walk at home.  Long: Continue to regain strength and stamina.   Short: return to rehab regularly. Long: increase workloads with incresaed strength.         Discharge Exercise Prescription (Final Exercise Prescription Changes): Exercise Prescription Changes - 07/24/18 1100      Response to Exercise   Blood Pressure (Admit)  134/56    Blood Pressure (Exit)  128/60    Heart Rate (Admit)  73 bpm    Heart Rate (Exercise)  80 bpm    Heart Rate (Exit)  75 bpm    Rating of Perceived Exertion (Exercise)  123    Symptoms  none    Comments  first day back after 2 weeks     Duration  Continue with 30 min of aerobic exercise without signs/symptoms of physical distress.    Intensity  THRR unchanged      Progression   Progression  Continue to progress workloads to maintain intensity without signs/symptoms of physical distress.    Average METs  2.15      Resistance Training   Training Prescription  Yes    Weight  5 lbs    Reps  10-15      Interval Training   Interval Training  No      NuStep   Level  2    Minutes  15    METs  2.8      Arm Ergometer   Level  1    Minutes   15    METs  1.5      Home Exercise Plan   Plans to continue exercise at  Home (comment)    Frequency  Add 2 additional days to program exercise sessions.    Initial Home Exercises Provided  06/03/18       Nutrition:  Target Goals: Understanding of nutrition guidelines, daily intake of sodium '1500mg'$ , cholesterol '200mg'$ , calories 30% from fat and 7% or less from saturated fats, daily to have 5 or more servings of fruits and vegetables.  Biometrics: Pre Biometrics - 05/01/18 1404  Pre Biometrics   Height  '5\' 5"'$  (1.651 m)    Weight  163 lb 12.8 oz (74.3 kg)    Waist Circumference  38.5 inches    Hip Circumference  38.5 inches    Waist to Hip Ratio  1 %    BMI (Calculated)  27.26    Single Leg Stand  27.92 seconds        Nutrition Therapy Plan and Nutrition Goals: Nutrition Therapy & Goals - 06/09/18 0911      Nutrition Therapy   RD appointment deferred  Yes       Nutrition Assessments: Nutrition Assessments - 05/01/18 1155      MEDFICTS Scores   Pre Score  34       Nutrition Goals Re-Evaluation: Nutrition Goals Re-Evaluation    Row Name 05/29/18 0951 07/01/18 1017 07/24/18 1017         Goals   Current Weight  166 lb (75.3 kg)  -  171 lb (77.6 kg)     Nutrition Goal  Meet with dietician  Continue to eat healthy  Eat a heart healthy diet, monitor sodium intake.         Comment  Akif would like to work on gaining muscle weight and staying lean.  He has a pretty good diet.  We made him an appointment to meet with dietician after class next Thursday (6/20).    Delvin declined meeting with Lattie Haw during class to set goals.  He continues to focus on healthy eating and getting fruits and vegetables.  He has tried the veggie pastas and really likes them. He is using the air fryer more and more.    His weight is up just a little bit after his trip to [place]. He stated that since he's back he is going to refocus on eating healthy, friuits, vegetables. Still using his air  fryer.      Expected Outcome  Short: Meet with dietician.  Long: Continue to follow recommendations to gain muscle weight  Short: Continue to try new recipes.  Long: Continue to work on gaining weight.   Short: refocus on his heart healthy diet. Long: gain a few more pounds and maintain a healthy 172.         Nutrition Goals Discharge (Final Nutrition Goals Re-Evaluation): Nutrition Goals Re-Evaluation - 07/24/18 1017      Goals   Current Weight  171 lb (77.6 kg)    Nutrition Goal  Eat a heart healthy diet, monitor sodium intake.        Comment  His weight is up just a little bit after his trip to [place]. He stated that since he's back he is going to refocus on eating healthy, friuits, vegetables. Still using his air fryer.     Expected Outcome  Short: refocus on his heart healthy diet. Long: gain a few more pounds and maintain a healthy 172.        Psychosocial: Target Goals: Acknowledge presence or absence of significant depression and/or stress, maximize coping skills, provide positive support system. Participant is able to verbalize types and ability to use techniques and skills needed for reducing stress and depression.   Initial Review & Psychosocial Screening: Initial Psych Review & Screening - 05/01/18 1448      Initial Review   Current issues with  None Identified      Family Dynamics   Good Support System?  --   Significant other  Family and Friends  Barriers   Psychosocial barriers to participate in program  The patient should benefit from training in stress management and relaxation.;There are no identifiable barriers or psychosocial needs.      Screening Interventions   Interventions  Encouraged to exercise;To provide support and resources with identified psychosocial needs;Provide feedback about the scores to participant    Expected Outcomes  Short Term goal: Utilizing psychosocial counselor, staff and physician to assist with identification of specific  Stressors or current issues interfering with healing process. Setting desired goal for each stressor or current issue identified.;Long Term Goal: Stressors or current issues are controlled or eliminated.;Short Term goal: Identification and review with participant of any Quality of Life or Depression concerns found by scoring the questionnaire.;Long Term goal: The participant improves quality of Life and PHQ9 Scores as seen by post scores and/or verbalization of changes       Quality of Life Scores:  Quality of Life - 05/01/18 1153      Quality of Life Scores   Health/Function Pre  27.83 %    Socioeconomic Pre  27.86 %    Psych/Spiritual Pre  30 %    Family Pre  30 %    GLOBAL Pre  28.6 %      Scores of 19 and below usually indicate a poorer quality of life in these areas.  A difference of  2-3 points is a clinically meaningful difference.  A difference of 2-3 points in the total score of the Quality of Life Index has been associated with significant improvement in overall quality of life, self-image, physical symptoms, and general health in studies assessing change in quality of life.  PHQ-9: Recent Review Flowsheet Data    Depression screen [facility] 2/9 05/01/2018   Decreased Interest 0   Down, Depressed, Hopeless 0   PHQ - 2 Score 0   Altered sleeping 0   Tired, decreased energy 0   Change in appetite 0   Feeling bad or failure about yourself  0   Trouble concentrating 0   Moving slowly or fidgety/restless 0   Suicidal thoughts 0   PHQ-9 Score 0   Difficult doing work/chores Not difficult at all     Interpretation of Total Score  Total Score Depression Severity:  1-4 = Minimal depression, 5-9 = Mild depression, 10-14 = Moderate depression, 15-19 = Moderately severe depression, 20-27 = Severe depression   Psychosocial Evaluation and Intervention: Psychosocial Evaluation - 05/08/18 0934      Psychosocial Evaluation & Interventions   Interventions  Encouraged to exercise with the  program and follow exercise prescription    Comments  Counselor met with Mr. Avina Giraud) today for initial psychosocial evaluation.  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  Jazziel has a strong support system with a fiance and (3) daughters he speaks with daily as the daughters are in [place].  Mikail reports sleeping better since his surgery and having a good appetite.  He denies a history of depression or anxiety other than some symptoms when his spouse passed 4 years ago; he denies any current symptoms.  Atreus states he is typically positive and has minimal stress in his life.  He has goals to be healthy enough to get back on his motorcycle and drive to [place] to visit family.  Staff will follow with Deidre Ala.    Expected Outcomes  Short:  Caz will exercise consistently to increase his stamin and strength.   Long:  Casin will be  strong enough to ride his motorcycle to [place] to visit family.     Continue Psychosocial Services   Follow up required by staff       Psychosocial Re-Evaluation: Psychosocial Re-Evaluation    [place] Name 05/29/18 1610 07/01/18 1015 07/24/18 1013         Psychosocial Re-Evaluation   Current issues with  None Identified  None Identified  Current Anxiety/Panic;Current Stress Concerns     Comments  Meyer has been doing well in rehab.  He has minimal stress in his life.  He really wants to get back on his bike.  He has an appointment with Dr. Roxy Manns on 6/24 to talk about this.  He feels that he has regained enough strength to ride again. After his trip, he also now has his own car back which makes him happier to have the freedom back.  He sleeps well and gets about 6 hours of sleep a night.  Kage has been able to return to riding his bike (except this week with weather).  He is also restarted his supplements and using his CBD oil again.  Thus he is sleeping better again.  He conintues to maintain status quo and a postive attitude.  The heat of summer prevents him from doing what he  wants to do outside.   Collan stated it was stressful for him to leave [place]. It caused him some anxiety leaving his children and old house behind. It caused him some excess SOB but he has began to cope since being back.      Expected Outcomes  Short: Talk to doctor about getting back on bike.  Long: Continue to stay postive.   Short: Continue to get out to ride early in day.  Long: Continue to stay positive.    Short: remain postive, stay active to keep his mind off of things. Long: Keep in touch with his family, positively cope with being so far away.      Interventions  -  Encouraged to attend Cardiac Rehabilitation for the exercise  -     Continue Psychosocial Services   -  Follow up required by staff  Follow up required by staff        Psychosocial Discharge (Final Psychosocial Re-Evaluation): Psychosocial Re-Evaluation - 07/24/18 1013      Psychosocial Re-Evaluation   Current issues with  Current Anxiety/Panic;Current Stress Concerns    Comments  Jeremey stated it was stressful for him to leave [place]. It caused him some anxiety leaving his children and old house behind. It caused him some excess SOB but he has began to cope since being back.     Expected Outcomes   Short: remain postive, stay active to keep his mind off of things. Long: Keep in touch with his family, positively cope with being so far away.     Continue Psychosocial Services   Follow up required by staff       Vocational Rehabilitation: Provide vocational rehab assistance to qualifying candidates.   Vocational Rehab Evaluation & Intervention: Vocational Rehab - 05/01/18 1450      Initial Vocational Rehab Evaluation & Intervention   Assessment shows need for Vocational Rehabilitation  No       Education: Education Goals: Education classes will be provided on a variety of topics geared toward better understanding of heart health and risk factor modification. Participant will state understanding/return demonstration of topics  presented as noted by education test scores.  Learning Barriers/Preferences: Learning Barriers/Preferences - 05/01/18 1449  Learning Barriers/Preferences   Learning Barriers  None    Learning Preferences  None       Education Topics:  AED/CPR: - Group verbal and written instruction with the use of models to demonstrate the basic use of the AED with the basic ABC's of resuscitation.   Cardiac Rehab from 07/29/2018 in [facility] Cardiac and Pulmonary Rehab  Date  05/08/18  Educator  CE  Instruction Review Code  1- Verbalizes Understanding      General Nutrition Guidelines/Fats and Fiber: -Group instruction provided by verbal, written material, models and posters to present the general guidelines for heart healthy nutrition. Gives an explanation and review of dietary fats and fiber.   Cardiac Rehab from 07/29/2018 in [facility] Cardiac and Pulmonary Rehab  Date  07/01/18  Educator  [facility]  Instruction Review Code  1- Verbalizes Understanding      Controlling Sodium/Reading Food Labels: -Group verbal and written material supporting the discussion of sodium use in heart healthy nutrition. Review and explanation with models, verbal and written materials for utilization of the food label.   Cardiac Rehab from 07/29/2018 in [facility] Cardiac and Pulmonary Rehab  Date  05/13/18  Educator  PI  Instruction Review Code  1- Verbalizes Understanding      Exercise Physiology & General Exercise Guidelines: - Group verbal and written instruction with models to review the exercise physiology of the cardiovascular system and associated critical values. Provides general exercise guidelines with specific guidelines to those with heart or lung disease.    Cardiac Rehab from 07/29/2018 in [facility] Cardiac and Pulmonary Rehab  Date  05/20/18  Educator  AS  Instruction Review Code  4- No Evidence of Learning [charting in error]      Aerobic Exercise & Resistance Training: - Gives group verbal and written instruction  on the various components of exercise. Focuses on aerobic and resistive training programs and the benefits of this training and how to safely progress through these programs..   Cardiac Rehab from 07/29/2018 in [facility] Cardiac and Pulmonary Rehab  Date  07/24/18  Educator  AS  Instruction Review Code  1- Verbalizes Understanding      Flexibility, Balance, Mind/Body Relaxation: Provides group verbal/written instruction on the benefits of flexibility and balance training, including mind/body exercise modes such as yoga, pilates and tai chi.  Demonstration and skill practice provided.   Cardiac Rehab from 07/29/2018 in [facility] Cardiac and Pulmonary Rehab  Date  07/29/18  Educator  AS  Instruction Review Code  1- Verbalizes Understanding      Stress and Anxiety: - Provides group verbal and written instruction about the health risks of elevated stress and causes of high stress.  Discuss the correlation between heart/lung disease and anxiety and treatment options. Review healthy ways to manage with stress and anxiety.   Cardiac Rehab from 07/29/2018 in [facility] Cardiac and Pulmonary Rehab  Date  06/24/18  Educator  [facility]  Instruction Review Code  1- Verbalizes Understanding      Depression: - Provides group verbal and written instruction on the correlation between heart/lung disease and depressed mood, treatment options, and the stigmas associated with seeking treatment.   Anatomy & Physiology of the Heart: - Group verbal and written instruction and models provide basic cardiac anatomy and physiology, with the coronary electrical and arterial systems. Review of Valvular disease and Heart Failure   Cardiac Procedures: - Group verbal and written instruction to review commonly prescribed medications for heart disease. Reviews the medication, class of the drug, and  side effects. Includes the steps to properly store meds and maintain the prescription regimen. (beta blockers and nitrates)   Cardiac  Medications I: - Group verbal and written instruction to review commonly prescribed medications for heart disease. Reviews the medication, class of the drug, and side effects. Includes the steps to properly store meds and maintain the prescription regimen.   Cardiac Rehab from 07/29/2018 in [facility] Cardiac and Pulmonary Rehab  Date  06/10/18  Educator  SB  Instruction Review Code  1- Verbalizes Understanding      Cardiac Medications II: -Group verbal and written instruction to review commonly prescribed medications for heart disease. Reviews the medication, class of the drug, and side effects. (all other drug classes)   Cardiac Rehab from 07/29/2018 in [facility] Cardiac and Pulmonary Rehab  Date  05/29/18  Educator  CE  Instruction Review Code  1- Verbalizes Understanding       Go Sex-Intimacy & Heart Disease, Get SMART - Goal Setting: - Group verbal and written instruction through game format to discuss heart disease and the return to sexual intimacy. Provides group verbal and written material to discuss and apply goal setting through the application of the S.M.A.R.T. Method.   Other Matters of the Heart: - Provides group verbal, written materials and models to describe Stable Angina and Peripheral Artery. Includes description of the disease process and treatment options available to the cardiac patient.   Exercise & Equipment Safety: - Individual verbal instruction and demonstration of equipment use and safety with use of the equipment.   Cardiac Rehab from 07/29/2018 in [facility] Cardiac and Pulmonary Rehab  Date  05/01/18  Educator  [facility]  Instruction Review Code  1- Verbalizes Understanding      Infection Prevention: - Provides verbal and written material to individual with discussion of infection control including proper hand washing and proper equipment cleaning during exercise session.   Cardiac Rehab from 07/29/2018 in [facility] Cardiac and Pulmonary Rehab  Date  05/01/18  Educator  mc   Instruction Review Code  1- Verbalizes Understanding      Falls Prevention: - Provides verbal and written material to individual with discussion of falls prevention and safety.   Cardiac Rehab from 07/29/2018 in [facility] Cardiac and Pulmonary Rehab  Date  05/01/18  Educator  mc  Instruction Review Code  1- Verbalizes Understanding      Diabetes: - Individual verbal and written instruction to review signs/symptoms of diabetes, desired ranges of glucose level fasting, after meals and with exercise. Acknowledge that pre and post exercise glucose checks will be done for 3 sessions at entry of program.   Cardiac Rehab from 07/29/2018 in [facility] Cardiac and Pulmonary Rehab  Date  05/01/18  Educator  [facility]  Instruction Review Code  1- Verbalizes Understanding      Know Your Numbers and Risk Factors: -Group verbal and written instruction about important numbers in your health.  Discussion of what are risk factors and how they play a role in the disease process.  Review of Cholesterol, Blood Pressure, Diabetes, and BMI and the role they play in your overall health.   Cardiac Rehab from 07/29/2018 in [facility] Cardiac and Pulmonary Rehab  Date  05/29/18  Educator  CE  Instruction Review Code  1- Verbalizes Understanding      Sleep Hygiene: -Provides group verbal and written instruction about how sleep can affect your health.  Define sleep hygiene, discuss sleep cycles and impact of sleep habits. Review good sleep hygiene tips.  Other: -Provides group and verbal instruction on various topics (see comments)   Knowledge Questionnaire Score: Knowledge Questionnaire Score - 05/01/18 1152      Knowledge Questionnaire Score   Pre Score  26/28   Reviewed correct responses with Deidre Ala today. He verbalized understanding and had no further questions      Core Components/Risk Factors/Patient Goals at Admission: Personal Goals and Risk Factors at Admission - 05/01/18 1443      Core Components/Risk  Factors/Patient Goals on Admission    Weight Management  Weight Maintenance;Yes    Intervention  Weight Management: Develop a combined nutrition and exercise program designed to reach desired caloric intake, while maintaining appropriate intake of nutrient and fiber, sodium and fats, and appropriate energy expenditure required for the weight goal.;Weight Management: Provide education and appropriate resources to help participant work on and attain dietary goals.    Admit Weight  163 lb 12.8 oz (74.3 kg)   Was in hospital for over 4 weeks, loos muscle mass and weight. Wants to regain muscle mass.    Goal Weight: Short Term  165 lb (74.8 kg)    Goal Weight: Long Term  170 lb (77.1 kg)    Expected Outcomes  Short Term: Continue to assess and modify interventions until short term weight is achieved;Long Term: Adherence to nutrition and physical activity/exercise program aimed toward attainment of established weight goal;Weight Maintenance: Understanding of the daily nutrition guidelines, which includes 25-35% calories from fat, 7% or less cal from saturated fats, less than '200mg'$  cholesterol, less than 1.5gm of sodium, & 5 or more servings of fruits and vegetables daily    Diabetes  Yes    Intervention  Provide education about signs/symptoms and action to take for hypo/hyperglycemia.;Provide education about proper nutrition, including hydration, and aerobic/resistive exercise prescription along with prescribed medications to achieve blood glucose in normal ranges: Fasting glucose 65-99 mg/dL    Expected Outcomes  Short Term: Participant verbalizes understanding of the signs/symptoms and immediate care of hyper/hypoglycemia, proper foot care and importance of medication, aerobic/resistive exercise and nutrition plan for blood glucose control.;Long Term: Attainment of HbA1C < 7%.    Hypertension  Yes    Intervention  Provide education on lifestyle modifcations including regular physical activity/exercise,  weight management, moderate sodium restriction and increased consumption of fresh fruit, vegetables, and low fat dairy, alcohol moderation, and smoking cessation.;Monitor prescription use compliance.    Expected Outcomes  Short Term: Continued assessment and intervention until BP is < 140/56m HG in hypertensive participants. < 130/88mHG in hypertensive participants with diabetes, heart failure or chronic kidney disease.;Long Term: Maintenance of blood pressure at goal levels.    Lipids  Yes    Intervention  Provide education and support for participant on nutrition & aerobic/resistive exercise along with prescribed medications to achieve LDL '70mg'$ , HDL >'40mg'$ .    Expected Outcomes  Short Term: Participant states understanding of desired cholesterol values and is compliant with medications prescribed. Participant is following exercise prescription and nutrition guidelines.;Long Term: Cholesterol controlled with medications as prescribed, with individualized exercise RX and with personalized nutrition plan. Value goals: LDL < '70mg'$ , HDL > 40 mg.    Personal Goal Other  Yes    Personal Goal  Wants to get back to riding his [place] muscle mass will in the hospital for over 4 weeks    Intervention  Provide exercise prescription to work on musvcle mass gain, strength and stamina    Expected Outcomes  ST: RaFestusill start to build  his muscle strength and stamina while following his exercise prescription. ZG:YFVCB will be able to ride his Markus Daft again without concern       Core Components/Risk Factors/Patient Goals Review:  Goals and Risk Factor Review    Row Name 05/29/18 0948 07/01/18 1013 07/24/18 1021         Core Components/Risk Factors/Patient Goals Review   Personal Goals Review  Weight Management/Obesity;Lipids;Hypertension;Diabetes  Weight Management/Obesity;Lipids;Hypertension;Diabetes  Weight Management/Obesity;Lipids;Hypertension;Diabetes     Review  Teresa has been trying to gain muscle  weight back.  He is up to 166 lbs and feeling stronger.  His blood pressures have been doing well and he checks them on occasion at home.  We talked about increase the frequency he checks his pressures at home.  Quintus's blood sugars have been doing well.  Today he was 147 mg/dl fasting.  He checks it daily in the morning.  He continues to do well on his medications and would like to get back on CBD oil and other supplements.  He has an appointmnent with Dr. Roxy Manns on the 24th and will ask him about those then.    Toby is doing well on his blood pressure and still checks them routinely at home. He continues to check his blood sugars at home as well.  He still gaining weight, he has about five more to go to get back to where he wants to be.  He is still doing well on his medications and has been able to restart his supplements and CBD oil. He is also sleeping better again.   Irven checked his blood pressures and blood sugar regulary while he was away and stated they were good. Since returning he is only about a pound away from where he wants to be but wants the weight to be gained in a healthy manner. Planning to restart his supplements and CBD oil since returning home.      Expected Outcomes  Short: Talk to doctor about CBD oil and check blood pressures more frequently.  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  Short: Contiue to work on Producer, television/film/video.  Long: Continue to monitor blood pressures and blood sugars regularly.   Short: continue to gain weight in a healthy way. Long: continue to take medications regularly and check blood pressures.         Core Components/Risk Factors/Patient Goals at Discharge (Final Review):  Goals and Risk Factor Review - 07/24/18 1021      Core Components/Risk Factors/Patient Goals Review   Personal Goals Review  Weight Management/Obesity;Lipids;Hypertension;Diabetes    Review  Art checked his blood pressures and blood sugar regulary while he was away and stated they were  good. Since returning he is only about a pound away from where he wants to be but wants the weight to be gained in a healthy manner. Planning to restart his supplements and CBD oil since returning home.     Expected Outcomes  Short: continue to gain weight in a healthy way. Long: continue to take medications regularly and check blood pressures.        ITP Comments: ITP Comments    Row Name 05/01/18 1155 05/07/18 0608 05/27/18 1458 06/04/18 0608 07/02/18 0555   ITP Comments  Medical review complete, ITP sent to Dr Loleta Chance for review,changes as needed and signature.  Documentation of diagnosis can be found in [facility] 02/07/2018 admission  30 day review. Continue with ITP unless directed changes per Medical Director  New to program  Jagar has been out on vacation for the last two weeks.   30 day review. Continue with ITP unless directed changes per Medical Director review  30 day review. Continue with ITP unless directed changes per Medical Director review.  1session in July   Row Name 07/10/18 1121 07/16/18 1500 07/30/18 0850       ITP Comments  Pt. has been on vacation since 07/01/18.  Jeanpaul has been out since 7/16.  Called to check up on him. He is preparing to move and hopes to return on Tuesday next week.   30 day review completed. ITP sent to Dr. Ramonita Lab, covering for Dr. Emily Filbert, Medical Director of Cardiac Rehab. Continue with ITP unless changes are made by physician        Comments: 30 day review

## 2018-07-31 ENCOUNTER — Encounter: Payer: Medicare Other | Admitting: *Deleted

## 2018-07-31 DIAGNOSIS — Z952 Presence of prosthetic heart valve: Secondary | ICD-10-CM | POA: Diagnosis not present

## 2018-07-31 DIAGNOSIS — Z951 Presence of aortocoronary bypass graft: Secondary | ICD-10-CM | POA: Diagnosis not present

## 2018-07-31 DIAGNOSIS — Z48812 Encounter for surgical aftercare following surgery on the circulatory system: Secondary | ICD-10-CM | POA: Diagnosis not present

## 2018-07-31 NOTE — Progress Notes (Signed)
Daily Session Note  Patient Details  Name: Harold Parker MRN: [id number] Date of Birth: September 08, 1946 Referring Provider:     Cardiac Rehab from 05/01/2018 in [facility] Cardiac and Pulmonary Rehab  Referring Provider  Harold Parker      Encounter Date: 07/31/2018  Check In: Session Check In - 07/31/18 2130      Check-In   Supervising physician immediately available to respond to emergencies  See telemetry face sheet for immediately available ER MD    Location  [facility]-Cardiac & Pulmonary Rehab    Staff Present  Harold Parker, BS, PEC;Harold Enterkin, RN, BSN;Harold Parker [place], MA, RCEP, CCRP, Exercise Physiologist    Medication changes reported      No    Fall or balance concerns reported     No    Warm-up and Cool-down  Performed on first and last piece of equipment    Resistance Training Performed  Yes    VAD Patient?  No    PAD/SET Patient?  No      Pain Assessment   Currently in Pain?  No/denies          Social History   Tobacco Use  Smoking Status Former Smoker  . Packs/day: 1.00  . Years: 15.00  . Pack years: 15.00  . Types: Cigarettes  . Last attempt to quit: 02/02/2018  . Years since quitting: 0.4  Smokeless Tobacco Never Used  Tobacco Comment   Quit 01/2018 - on 05/01/2018 talked to Harold Parker about Relaspe concerns. He ststaes that he has no taste for tobacco since he quit in Feb.    Goals Met:  Independence with exercise equipment Exercise tolerated well No report of cardiac concerns or symptoms Strength training completed today  Goals Unmet:  Not Applicable  Comments: Pt able to follow exercise prescription today without complaint.  Will continue to monitor for progression.    Dr. Emily Parker is Medical Director for [place] and [facility].

## 2018-08-05 ENCOUNTER — Encounter: Payer: Medicare Other | Admitting: *Deleted

## 2018-08-05 VITALS — Ht 65.0 in | Wt 172.0 lb

## 2018-08-05 DIAGNOSIS — Z952 Presence of prosthetic heart valve: Secondary | ICD-10-CM

## 2018-08-05 DIAGNOSIS — Z48812 Encounter for surgical aftercare following surgery on the circulatory system: Secondary | ICD-10-CM | POA: Diagnosis not present

## 2018-08-05 DIAGNOSIS — Z951 Presence of aortocoronary bypass graft: Secondary | ICD-10-CM | POA: Diagnosis not present

## 2018-08-05 NOTE — Progress Notes (Signed)
Daily Session Note  Patient Details  Name: Harold Parker MRN: [id number] Date of Birth: 05/11/1946 Referring Provider:     Cardiac Rehab from 05/01/2018 in [facility] Cardiac and Pulmonary Rehab  Referring Provider  Fletcher Anon      Encounter Date: 08/05/2018  Check In: Session Check In - 08/05/18 0943      Check-In   Supervising physician immediately available to respond to emergencies  See telemetry face sheet for immediately available ER MD    Location  [facility]-Cardiac & Pulmonary Rehab    Staff Present  Joellyn Rued, BS, PEC;Krista Kensington, RN BSN;Jessica Evarts, [place], RCEP, CCRP, Exercise Physiologist    Medication changes reported      No    Fall or balance concerns reported     No    Warm-up and Cool-down  Performed on first and last piece of equipment    Resistance Training Performed  Yes    VAD Patient?  No    PAD/SET Patient?  No      Pain Assessment   Currently in Pain?  No/denies          Social History   Tobacco Use  Smoking Status Former Smoker  . Packs/day: 1.00  . Years: 15.00  . Pack years: 15.00  . Types: Cigarettes  . Last attempt to quit: 02/02/2018  . Years since quitting: 0.5  Smokeless Tobacco Never Used  Tobacco Comment   Quit 01/2018 - on 05/01/2018 talked to Cary about Relaspe concerns. He ststaes that he has no taste for tobacco since he quit in Feb.    Goals Met:  Independence with exercise equipment Exercise tolerated well No report of cardiac concerns or symptoms Strength training completed today  Goals Unmet:  Not Applicable  Comments: Pt able to follow exercise prescription today without complaint.  Will continue to monitor for progression.    Dr. Emily Filbert is Medical Director for [place] and [facility].

## 2018-08-06 DIAGNOSIS — N183 Chronic kidney disease, stage 3 (moderate): Secondary | ICD-10-CM | POA: Diagnosis not present

## 2018-08-06 DIAGNOSIS — J449 Chronic obstructive pulmonary disease, unspecified: Secondary | ICD-10-CM | POA: Diagnosis not present

## 2018-08-06 DIAGNOSIS — I700 Atherosclerosis of aorta: Secondary | ICD-10-CM | POA: Diagnosis not present

## 2018-08-06 DIAGNOSIS — E7800 Pure hypercholesterolemia, unspecified: Secondary | ICD-10-CM | POA: Diagnosis not present

## 2018-08-06 DIAGNOSIS — E1121 Type 2 diabetes mellitus with diabetic nephropathy: Secondary | ICD-10-CM | POA: Diagnosis not present

## 2018-08-06 DIAGNOSIS — Z79899 Other long term (current) drug therapy: Secondary | ICD-10-CM | POA: Diagnosis not present

## 2018-08-06 DIAGNOSIS — I129 Hypertensive chronic kidney disease with stage 1 through stage 4 chronic kidney disease, or unspecified chronic kidney disease: Secondary | ICD-10-CM | POA: Diagnosis not present

## 2018-08-07 ENCOUNTER — Encounter: Payer: Medicare Other | Admitting: *Deleted

## 2018-08-07 DIAGNOSIS — Z952 Presence of prosthetic heart valve: Secondary | ICD-10-CM | POA: Diagnosis not present

## 2018-08-07 DIAGNOSIS — Z48812 Encounter for surgical aftercare following surgery on the circulatory system: Secondary | ICD-10-CM | POA: Diagnosis not present

## 2018-08-07 DIAGNOSIS — Z951 Presence of aortocoronary bypass graft: Secondary | ICD-10-CM | POA: Diagnosis not present

## 2018-08-07 NOTE — Progress Notes (Signed)
Daily Session Note  Patient Details  Name: Harold Parker MRN: [id number] Date of Birth: [birth year] Referring Provider:     Cardiac Rehab from 05/01/2018 in [facility] Cardiac and Pulmonary Rehab  Referring Provider  Harold Parker      Encounter Date: 08/07/2018  Check In: Session Check In - 08/07/18 0926      Check-In   Supervising physician immediately available to respond to emergencies  See telemetry face sheet for immediately available ER MD    Location  [facility]-Cardiac & Pulmonary Rehab    Staff Present  Harold Parker, BS, PEC;Harold Enterkin, RN, BSN;Harold Parker [place], MA, RCEP, CCRP, Exercise Physiologist;Harold Parker RCP,RRT,BSRT    Medication changes reported      No    Warm-up and Cool-down  Performed on first and last piece of equipment    Resistance Training Performed  Yes    VAD Patient?  No    PAD/SET Patient?  No      Pain Assessment   Currently in Pain?  No/denies    Multiple Pain Sites  No          Social History   Tobacco Use  Smoking Status Former Smoker  . Packs/day: 1.00  . Years: 15.00  . Pack years: 15.00  . Types: Cigarettes  . Last attempt to quit: 02/02/2018  . Years since quitting: 0.5  Smokeless Tobacco Never Used  Tobacco Comment   Quit 01/2018 - on 05/01/2018 talked to Harold Parker about Relaspe concerns. He ststaes that he has no taste for tobacco since he quit in Feb.    Goals Met:  Independence with exercise equipment Exercise tolerated well No report of cardiac concerns or symptoms Strength training completed today  Goals Unmet:  Not Applicable  Comments: Pt able to follow exercise prescription today without complaint.  Will continue to monitor for progression.    Dr. Emily Filbert is Medical Director for [facility] and [facility].

## 2018-08-12 ENCOUNTER — Encounter: Payer: Medicare Other | Admitting: *Deleted

## 2018-08-12 DIAGNOSIS — Z952 Presence of prosthetic heart valve: Secondary | ICD-10-CM | POA: Diagnosis not present

## 2018-08-12 DIAGNOSIS — Z951 Presence of aortocoronary bypass graft: Secondary | ICD-10-CM | POA: Diagnosis not present

## 2018-08-12 DIAGNOSIS — Z48812 Encounter for surgical aftercare following surgery on the circulatory system: Secondary | ICD-10-CM | POA: Diagnosis not present

## 2018-08-12 NOTE — Progress Notes (Signed)
Daily Session Note  Patient Details  Name: Harold Parker MRN: [id number] Date of Birth: 09-29-1946 Referring Provider:     Cardiac Rehab from 05/01/2018 in [facility] Cardiac and Pulmonary Rehab  Referring Provider  Fletcher Anon      Encounter Date: 08/12/2018  Check In: Session Check In - 08/12/18 9163      Check-In   Supervising physician immediately available to respond to emergencies  See telemetry face sheet for immediately available ER MD    Location  [facility]-Cardiac & Pulmonary Rehab    Staff Present  Joellyn Rued, BS, PEC;Susanne Bice, RN, BSN, CCRP;Fidel Caggiano [place], MA, RCEP, CCRP, Exercise Physiologist;Amanda Oletta Darter, BA, ACSM CEP, Exercise Physiologist    Medication changes reported      No    Fall or balance concerns reported     No    Warm-up and Cool-down  Performed on first and last piece of equipment    Resistance Training Performed  Yes    VAD Patient?  No    PAD/SET Patient?  No      Pain Assessment   Currently in Pain?  No/denies          Social History   Tobacco Use  Smoking Status Former Smoker  . Packs/day: 1.00  . Years: 15.00  . Pack years: 15.00  . Types: Cigarettes  . Last attempt to quit: 02/02/2018  . Years since quitting: 0.5  Smokeless Tobacco Never Used  Tobacco Comment   Quit 01/2018 - on 05/01/2018 talked to Magdiel about Relaspe concerns. He ststaes that he has no taste for tobacco since he quit in Feb.    Goals Met:  Independence with exercise equipment Exercise tolerated well No report of cardiac concerns or symptoms Strength training completed today  Goals Unmet:  Not Applicable  Comments: Pt able to follow exercise prescription today without complaint.  Will continue to monitor for progression.    Dr. Emily Filbert is Medical Director for [facility] and [facility].

## 2018-08-15 DIAGNOSIS — H04122 Dry eye syndrome of left lacrimal gland: Secondary | ICD-10-CM | POA: Diagnosis not present

## 2018-08-15 DIAGNOSIS — H04121 Dry eye syndrome of right lacrimal gland: Secondary | ICD-10-CM | POA: Diagnosis not present

## 2018-08-19 ENCOUNTER — Encounter: Payer: Medicare Other | Attending: Cardiovascular Disease | Admitting: *Deleted

## 2018-08-19 DIAGNOSIS — Z951 Presence of aortocoronary bypass graft: Secondary | ICD-10-CM | POA: Diagnosis not present

## 2018-08-19 DIAGNOSIS — Z952 Presence of prosthetic heart valve: Secondary | ICD-10-CM | POA: Diagnosis not present

## 2018-08-19 DIAGNOSIS — Z48812 Encounter for surgical aftercare following surgery on the circulatory system: Secondary | ICD-10-CM | POA: Diagnosis not present

## 2018-08-19 NOTE — Progress Notes (Signed)
Daily Session Note  Patient Details  Name: Harold Parker MRN: [id number] Date of Birth: 10/07/1946 Referring Provider:     Cardiac Rehab from 05/01/2018 in [facility] Cardiac and Pulmonary Rehab  Referring Provider  Arida      Encounter Date: 08/19/2018  Check In: Session Check In - 08/19/18 0935      Check-In   Supervising physician immediately available to respond to emergencies  See telemetry face sheet for immediately available ER MD    Location  [facility]-Cardiac & Pulmonary Rehab    Staff Present  Mary Jo Abernethy, RN, BSN, MA;Jessica Hawkins, MA, RCEP, CCRP, Exercise Physiologist;Amanda Sommer, BA, ACSM CEP, Exercise Physiologist    Medication changes reported      No    Fall or balance concerns reported     No    Warm-up and Cool-down  Performed on first and last piece of equipment    Resistance Training Performed  Yes    VAD Patient?  No    PAD/SET Patient?  No      Pain Assessment   Currently in Pain?  No/denies          Social History   Tobacco Use  Smoking Status Former Smoker  . Packs/day: 1.00  . Years: 15.00  . Pack years: 15.00  . Types: Cigarettes  . Last attempt to quit: 02/02/2018  . Years since quitting: 0.5  Smokeless Tobacco Never Used  Tobacco Comment   Quit 01/2018 - on 05/01/2018 talked to Letcher about Relaspe concerns. He ststaes that he has no taste for tobacco since he quit in Feb.    Goals Met:  Independence with exercise equipment Exercise tolerated well No report of cardiac concerns or symptoms Strength training completed today  Goals Unmet:  Not Applicable  Comments: Pt able to follow exercise prescription today without complaint.  Will continue to monitor for progression.    Dr. Mark Miller is Medical Director for [facility] and [facility]. 

## 2018-08-21 DIAGNOSIS — Z952 Presence of prosthetic heart valve: Secondary | ICD-10-CM | POA: Diagnosis not present

## 2018-08-21 DIAGNOSIS — Z951 Presence of aortocoronary bypass graft: Secondary | ICD-10-CM | POA: Diagnosis not present

## 2018-08-21 DIAGNOSIS — Z48812 Encounter for surgical aftercare following surgery on the circulatory system: Secondary | ICD-10-CM | POA: Diagnosis not present

## 2018-08-21 NOTE — Progress Notes (Signed)
Daily Session Note  Patient Details  Name: Rylyn Ranganathan MRN: [id number] Date of Birth: [birth year] Referring Provider:     Cardiac Rehab from 05/01/2018 in [facility] Cardiac and Pulmonary Rehab  Referring Provider  Arida      Encounter Date: 08/21/2018  Check In:      Social History   Tobacco Use  Smoking Status Former Smoker  . Packs/day: 1.00  . Years: 15.00  . Pack years: 15.00  . Types: Cigarettes  . Last attempt to quit: 02/02/2018  . Years since quitting: 0.5  Smokeless Tobacco Never Used  Tobacco Comment   Quit 01/2018 - on 05/01/2018 talked to Jeziah about Relaspe concerns. He ststaes that he has no taste for tobacco since he quit in Feb.    Goals Met:  Independence with exercise equipment Exercise tolerated well No report of cardiac concerns or symptoms Strength training completed today  Goals Unmet:  Not Applicable  Comments: Pt able to follow exercise prescription today without complaint.  Will continue to monitor for progression.    Dr. Emily Filbert is Medical Director for [facility] and [facility].

## 2018-08-26 ENCOUNTER — Encounter: Payer: Medicare Other | Admitting: *Deleted

## 2018-08-26 DIAGNOSIS — Z951 Presence of aortocoronary bypass graft: Secondary | ICD-10-CM

## 2018-08-26 DIAGNOSIS — Z952 Presence of prosthetic heart valve: Secondary | ICD-10-CM

## 2018-08-26 DIAGNOSIS — Z48812 Encounter for surgical aftercare following surgery on the circulatory system: Secondary | ICD-10-CM | POA: Diagnosis not present

## 2018-08-26 NOTE — Progress Notes (Signed)
Daily Session Note  Patient Details  Name: Harold Parker MRN: [id number] Date of Birth: 1945-12-27 Referring Provider:     Cardiac Rehab from 05/01/2018 in [facility] Cardiac and Pulmonary Rehab  Referring Provider  Harold Parker      Encounter Date: 08/26/2018  Check In: Session Check In - 08/26/18 0943      Check-In   Supervising physician immediately available to respond to emergencies  See telemetry face sheet for immediately available ER MD    Location  [facility]-Cardiac & Pulmonary Rehab    Staff Present  Harold Cowden, RN, BSN, [place], MA, RCEP, CCRP, Exercise Physiologist;Harold Parker, [place], ACSM CEP, Exercise Physiologist    Medication changes reported      No    Fall or balance concerns reported     No    Warm-up and Cool-down  Performed on first and last piece of equipment    Resistance Training Performed  Yes    VAD Patient?  No    PAD/SET Patient?  No      Pain Assessment   Currently in Pain?  No/denies          Social History   Tobacco Use  Smoking Status Former Smoker  . Packs/day: 1.00  . Years: 15.00  . Pack years: 15.00  . Types: Cigarettes  . Last attempt to quit: 02/02/2018  . Years since quitting: 0.5  Smokeless Tobacco Never Used  Tobacco Comment   Quit 01/2018 - on 05/01/2018 talked to Harold Parker about Relaspe concerns. He ststaes that he has no taste for tobacco since he quit in Feb.    Goals Met:  Independence with exercise equipment Exercise tolerated well No report of cardiac concerns or symptoms Strength training completed today  Goals Unmet:  Not Applicable  Comments: Pt able to follow exercise prescription today without complaint.  Will continue to monitor for progression.    Dr. Emily Filbert is Medical Director for [facility] and [facility].

## 2018-08-26 NOTE — Patient Instructions (Signed)
Discharge Patient Instructions  Patient Details  Name: Harold Parker MRN: [id number] Date of Birth: 1946-01-07 Referring Provider:  Wellington [place], MD   Number of Visits: 62  Reason for Discharge:  Patient reached a stable level of exercise. Patient independent in their exercise. Patient has met program and personal goals.  Smoking History:  Social History   Tobacco Use  Smoking Status Former Smoker  . Packs/day: 1.00  . Years: 15.00  . Pack years: 15.00  . Types: Cigarettes  . Last attempt to quit: 02/02/2018  . Years since quitting: 0.5  Smokeless Tobacco Never Used  Tobacco Comment   Quit 01/2018 - on 05/01/2018 talked to Aristeo about Relaspe concerns. He ststaes that he has no taste for tobacco since he quit in Feb.    Diagnosis:  S/P CABG x 3  S/P aortic valve replacement  Initial Exercise Prescription: Initial Exercise Prescription - 05/01/18 1400      Date of Initial Exercise RX and Referring Provider   Date  05/01/18    Referring Provider  Arida      Treadmill   MPH  1.5    Grade  0    Minutes  15    METs  2.15      Recumbant Bike   Level  1    RPM  60    Watts  16    Minutes  15    METs  2      NuStep   Level  2    SPM  80    Minutes  15    METs  2      Prescription Details   Frequency (times per week)  2    Duration  Progress to 30 minutes of continuous aerobic without signs/symptoms of physical distress      Intensity   THRR 40-80% of Max Heartrate  105-133    Ratings of Perceived Exertion  11-13    Perceived Dyspnea  0-4      Resistance Training   Training Prescription  Yes    Weight  3 lb    Reps  10-15       Discharge Exercise Prescription (Final Exercise Prescription Changes): Exercise Prescription Changes - 08/19/18 1600      Response to Exercise   Blood Pressure (Admit)  146/64    Blood Pressure (Exercise)  128/60    Blood Pressure (Exit)  134/64    Heart Rate (Admit)  71 bpm    Heart Rate (Exercise)  72 bpm     Heart Rate (Exit)  73 bpm    Rating of Perceived Exertion (Exercise)  12    Symptoms  none    Duration  Continue with 30 min of aerobic exercise without signs/symptoms of physical distress.    Intensity  THRR unchanged      Progression   Progression  Continue to progress workloads to maintain intensity without signs/symptoms of physical distress.    Average METs  2.3      Resistance Training   Training Prescription  Yes    Weight  5 lbs    Reps  10-15      Interval Training   Interval Training  No      NuStep   Level  7    Minutes  15    METs  2.5      Arm Ergometer   Level  1    Minutes  15    METs  2.1  Home Exercise Plan   Plans to continue exercise at  Home (comment)    Frequency  Add 2 additional days to program exercise sessions.    Initial Home Exercises Provided  06/03/18       Functional Capacity: 6 Minute Walk    Row Name 05/01/18 1406 08/05/18 1048       6 Minute Walk   Phase  -  Discharge    Distance  888 feet  1042 feet    Distance % Change  -  17 %    Distance Feet Change  -  154 ft    Walk Time  6 minutes  6 minutes    # of Rest Breaks  0  0    MPH  1.68  1.97    METS  2.04  2.19    RPE  11  13    Perceived Dyspnea   0  1    VO2 Peak  7.17  7.69    Symptoms  No  Yes (comment)    Comments  -  calf pain 8/10 (normally takes lidocaine     Resting HR  78 bpm  82 bpm    Resting BP  134/64  126/64    Resting Oxygen Saturation   94 %  93 %    Exercise Oxygen Saturation  during 6 min walk  94 %  91 %    Max Ex. HR  110 bpm  93 bpm    Max Ex. BP  126/60  142/76    2 Minute Post BP  116/68  -      Interval HR   1 Minute HR  -  85    2 Minute HR  -  81    3 Minute HR  -  90    4 Minute HR  -  92    6 Minute HR  -  93    Interval Heart Rate?  -  Yes      Interval Oxygen   Interval Oxygen?  -  Yes    Baseline Oxygen Saturation %  -  93 %    1 Minute Oxygen Saturation %  -  93 %    1 Minute Liters of Oxygen  -  0 L    2 Minute Oxygen  Saturation %  -  92 %    2 Minute Liters of Oxygen  -  0 L    3 Minute Oxygen Saturation %  -  92 %    3 Minute Liters of Oxygen  -  0 L    4 Minute Oxygen Saturation %  -  92 %    4 Minute Liters of Oxygen  -  0 L    5 Minute Liters of Oxygen  -  0 L    6 Minute Oxygen Saturation %  -  91 %    6 Minute Liters of Oxygen  -  0 L    2 Minute Post Liters of Oxygen  -  0 L       Quality of Life: Quality of Life - 05/01/18 1153      Quality of Life Scores   Health/Function Pre  27.83 %    Socioeconomic Pre  27.86 %    Psych/Spiritual Pre  30 %    Family Pre  30 %    GLOBAL Pre  28.6 %       Personal Goals: Goals established at orientation  with interventions provided to work toward goal. Personal Goals and Risk Factors at Admission - 05/01/18 1443      Core Components/Risk Factors/Patient Goals on Admission    Weight Management  Weight Maintenance;Yes    Intervention  Weight Management: Develop a combined nutrition and exercise program designed to reach desired caloric intake, while maintaining appropriate intake of nutrient and fiber, sodium and fats, and appropriate energy expenditure required for the weight goal.;Weight Management: Provide education and appropriate resources to help participant work on and attain dietary goals.    Admit Weight  163 lb 12.8 oz (74.3 kg)   Was in hospital for over 4 weeks, loos muscle mass and weight. Wants to regain muscle mass.    Goal Weight: Short Term  165 lb (74.8 kg)    Goal Weight: Long Term  170 lb (77.1 kg)    Expected Outcomes  Short Term: Continue to assess and modify interventions until short term weight is achieved;Long Term: Adherence to nutrition and physical activity/exercise program aimed toward attainment of established weight goal;Weight Maintenance: Understanding of the daily nutrition guidelines, which includes 25-35% calories from fat, 7% or less cal from saturated fats, less than 264m cholesterol, less than 1.5gm of sodium, & 5  or more servings of fruits and vegetables daily    Diabetes  Yes    Intervention  Provide education about signs/symptoms and action to take for hypo/hyperglycemia.;Provide education about proper nutrition, including hydration, and aerobic/resistive exercise prescription along with prescribed medications to achieve blood glucose in normal ranges: Fasting glucose 65-99 mg/dL    Expected Outcomes  Short Term: Participant verbalizes understanding of the signs/symptoms and immediate care of hyper/hypoglycemia, proper foot care and importance of medication, aerobic/resistive exercise and nutrition plan for blood glucose control.;Long Term: Attainment of HbA1C < 7%.    Hypertension  Yes    Intervention  Provide education on lifestyle modifcations including regular physical activity/exercise, weight management, moderate sodium restriction and increased consumption of fresh fruit, vegetables, and low fat dairy, alcohol moderation, and smoking cessation.;Monitor prescription use compliance.    Expected Outcomes  Short Term: Continued assessment and intervention until BP is < 140/963mHG in hypertensive participants. < 130/8033mG in hypertensive participants with diabetes, heart failure or chronic kidney disease.;Long Term: Maintenance of blood pressure at goal levels.    Lipids  Yes    Intervention  Provide education and support for participant on nutrition & aerobic/resistive exercise along with prescribed medications to achieve LDL <70m61mDL >40mg49m Expected Outcomes  Short Term: Participant states understanding of desired cholesterol values and is compliant with medications prescribed. Participant is following exercise prescription and nutrition guidelines.;Long Term: Cholesterol controlled with medications as prescribed, with individualized exercise RX and with personalized nutrition plan. Value goals: LDL < 70mg,62m > 40 mg.    Personal Goal Other  Yes    Personal Goal  Wants to get back to riding his  [place] muscle mass will in the hospital for over 4 weeks    Intervention  Provide exercise prescription to work on musvcle mass gain, strength and stamina    Expected Outcomes  ST: Lathon Camillostart to build his muscle strength and stamina while following his exercise prescription. LT:RalUE:AVWUJbe able to ride his [place] without concern        Personal Goals Discharge: Goals and Risk Factor Review - 08/12/18 1032      Core Components/Risk Factors/Patient Goals Review   Personal Goals Review  Weight  Management/Obesity;Lipids;Hypertension;Diabetes    Review  Keola had blood tests last week - cholesterol and glucose were all in normal to low range.  He is taking all meds as directed. His BP and BG have been good at home.  He does get cramps in his legs sometimes - esp after riding his motorcycle.  He can put lidocaine on them  and that relieves cramps.  He feels like he has good energy levels.      Expected Outcomes  Short - finish HT program Long - maintain healthy habits on his own       Exercise Goals and Review: Exercise Goals    Row Name 05/01/18 1405             Exercise Goals   Increase Physical Activity  Yes       Intervention  Provide advice, education, support and counseling about physical activity/exercise needs.;Develop an individualized exercise prescription for aerobic and resistive training based on initial evaluation findings, risk stratification, comorbidities and participant's personal goals.       Expected Outcomes  Short Term: Attend rehab on a regular basis to increase amount of physical activity.;Long Term: Add in home exercise to make exercise part of routine and to increase amount of physical activity.;Long Term: Exercising regularly at least 3-5 days a week.       Increase Strength and Stamina  Yes       Intervention  Provide advice, education, support and counseling about physical activity/exercise needs.;Develop an individualized exercise prescription  for aerobic and resistive training based on initial evaluation findings, risk stratification, comorbidities and participant's personal goals.       Expected Outcomes  Short Term: Increase workloads from initial exercise prescription for resistance, speed, and METs.;Short Term: Perform resistance training exercises routinely during rehab and add in resistance training at home;Long Term: Improve cardiorespiratory fitness, muscular endurance and strength as measured by increased METs and functional capacity (6MWT)       Able to understand and use rate of perceived exertion (RPE) scale  Yes       Intervention  Provide education and explanation on how to use RPE scale       Expected Outcomes  Short Term: Able to use RPE daily in rehab to express subjective intensity level;Long Term:  Able to use RPE to guide intensity level when exercising independently       Able to understand and use Dyspnea scale  Yes       Intervention  Provide education and explanation on how to use Dyspnea scale       Expected Outcomes  Short Term: Able to use Dyspnea scale daily in rehab to express subjective sense of shortness of breath during exertion;Long Term: Able to use Dyspnea scale to guide intensity level when exercising independently       Knowledge and understanding of Target Heart Rate Range (THRR)  Yes       Intervention  Provide education and explanation of THRR including how the numbers were predicted and where they are located for reference       Expected Outcomes  Short Term: Able to state/look up THRR;Short Term: Able to use daily as guideline for intensity in rehab;Long Term: Able to use THRR to govern intensity when exercising independently       Able to check pulse independently  Yes       Intervention  Provide education and demonstration on how to check pulse in carotid and radial arteries.;Review the importance of  being able to check your own pulse for safety during independent exercise       Expected Outcomes   Short Term: Able to explain why pulse checking is important during independent exercise;Long Term: Able to check pulse independently and accurately       Understanding of Exercise Prescription  Yes       Intervention  Provide education, explanation, and written materials on patient's individual exercise prescription       Expected Outcomes  Short Term: Able to explain program exercise prescription;Long Term: Able to explain home exercise prescription to exercise independently          Nutrition & Weight - Outcomes: Pre Biometrics - 05/01/18 1404      Pre Biometrics   Height  5' 5" (1.651 m)    Weight  163 lb 12.8 oz (74.3 kg)    Waist Circumference  38.5 inches    Hip Circumference  38.5 inches    Waist to Hip Ratio  1 %    BMI (Calculated)  27.26    Single Leg Stand  27.92 seconds      Post Biometrics - 08/05/18 1047       Post  Biometrics   Height  5' 5" (1.651 m)    Weight  172 lb (78 kg)    Waist Circumference  40.5 inches    Hip Circumference  40.5 inches    Waist to Hip Ratio  1 %    BMI (Calculated)  28.62    Single Leg Stand  14.62 seconds       Nutrition: Nutrition Therapy & Goals - 06/09/18 0911      Nutrition Therapy   RD appointment deferred  Yes       Nutrition Discharge: Nutrition Assessments - 05/01/18 1155      MEDFICTS Scores   Pre Score  34       Education Questionnaire Score: Knowledge Questionnaire Score - 05/01/18 1152      Knowledge Questionnaire Score   Pre Score  26/28   Reviewed correct responses with Deidre Ala today. He verbalized understanding and had no further questions      Goals reviewed with patient; copy given to patient.

## 2018-08-27 ENCOUNTER — Encounter: Payer: Self-pay | Admitting: *Deleted

## 2018-08-27 DIAGNOSIS — Z951 Presence of aortocoronary bypass graft: Secondary | ICD-10-CM

## 2018-08-27 DIAGNOSIS — Z952 Presence of prosthetic heart valve: Secondary | ICD-10-CM

## 2018-08-27 NOTE — Progress Notes (Signed)
Cardiac Individual Treatment Plan  Patient Details  Name: Suresh Audi MRN: [id number] Date of Birth: 1946/07/03 Referring Provider:     Cardiac Rehab from 05/01/2018 in [facility] Cardiac and Pulmonary Rehab  Referring Provider  Arida      Initial Encounter Date:    Cardiac Rehab from 05/01/2018 in [facility] Cardiac and Pulmonary Rehab  Date  05/01/18      Visit Diagnosis: S/P CABG x 3  S/P aortic valve replacement  Patient's Home Medications on Admission:  Current Outpatient Medications:  .  acetaminophen (TYLENOL) 325 MG tablet, Take 650 mg by mouth at bedtime., Disp: , Rfl:  .  ALPRAZolam (XANAX) 0.5 MG tablet, Take 0.5 mg by mouth at bedtime as needed for anxiety., Disp: , Rfl:  .  Ascorbic Acid (VITAMIN C) 1000 MG tablet, Take 1,000 mg by mouth daily., Disp: , Rfl:  .  aspirin EC 81 MG tablet, Take 81 mg by mouth daily., Disp: , Rfl:  .  atorvastatin (LIPITOR) 80 MG tablet, TAKE 1 TABLET BY MOUTH DAILY AT 6 PM, Disp: 30 tablet, Rfl: 0 .  esomeprazole (NEXIUM) 40 MG capsule, Take 40 mg by mouth daily at 12 noon., Disp: , Rfl:  .  ezetimibe (ZETIA) 10 MG tablet, Take 10 mg by mouth daily., Disp: , Rfl:  .  folic acid (FOLVITE) 409 MCG tablet, Take 400 mcg by mouth every evening., Disp: , Rfl:  .  furosemide (LASIX) 40 MG tablet, Take 1 tablet (40 mg) by mouth once daily, Disp: 90 tablet, Rfl: 3 .  glipiZIDE (GLUCOTROL) 5 MG tablet, Take 0.5 tablets (2.5 mg total) by mouth daily before breakfast., Disp: 30 tablet, Rfl: 1 .  ipratropium-albuterol (DUONEB) 0.5-2.5 (3) MG/3ML SOLN, Take 3 mLs by nebulization every 6 (six) hours as needed., Disp: 360 mL, Rfl: 1 .  losartan (COZAAR) 100 MG tablet, Take 1 tablet (100 mg total) by mouth daily., Disp: 90 tablet, Rfl: 3 .  metFORMIN (GLUCOPHAGE) 1000 MG tablet, TAKE 1 TABLET BY MOUTH TWICE DAILY WITH A MEAL, Disp: 60 tablet, Rfl: 0 .  metoprolol tartrate (LOPRESSOR) 50 MG tablet, TAKE 1 TABLET BY MOUTH TWICE DAILY, Disp: 60 tablet, Rfl: 0 .   Vitamin D, Cholecalciferol, 1000 units TABS, Take 1 tablet by mouth every morning. , Disp: , Rfl:   Past Medical History: Past Medical History:  Diagnosis Date  . Bilateral carotid bruits    a. 01/2018 U/S: < 50% bilat ICA stenosis.  Marland Kitchen CAD (coronary artery disease)    a. 1998 s/p MI and BMS [facility], [place]); b. 1999 redo PCI/rotablator in setting of what sounds like ISR;  c. Multiple stress tests over the years - last ~ 2017, reportedly nl; d. 01/2018 NSTEMI/Cath: LM 45md, LAD 50p, 40p/m, D1 60ost, OM1 95, RCA 100ost/p w/ L->R collats, EF 45%; e. s/p 3V CABG 02/19/18 (LIMA-LAD, VG-D1, VG-OM)  . Chronic lower back pain   . [place]   . GIB (gastrointestinal bleeding)    a. 02/2018 GIB and anemia w/ Hgb of 4.7 on presentation; b. 03/2018 EGD: 2 nonbleeding duodenal ulcers.  .Marland KitchenHTN (hypertension)   . Hypercholesteremia   . Ischemic cardiomyopathy    a. 01/2018 Echo: EF 40-45%, mid-apicalanteroseptal, ant, apical sev HK, mod apicalinferior HK. Gr2 DD. Mod AS, mild MR, mod dil LA, PASP 563mg.  . Moderate aortic stenosis    a. 01/2018 Echo: Mod AS, mean grad (S) 2070m, Valve area (VTI) 1.06 cm^2, (Vmax) 1.27 cm^2; b. s/p bioprosthetic  AVR 02/19/18.  . Myocardial infarction ([place]) ~ 1998/1999  . 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 02/19/2018 AVR: 25 mm Edwards Inspiris Resilia stented bovine pericardial tissue valve  . 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 Tobacco abuse   . Type II diabetes mellitus (HCC)     Tobacco Use: Social History   Tobacco Use  Smoking Status Former Smoker  . Packs/day: 1.00  . Years: 15.00  . Pack years: 15.00  . Types: Cigarettes  . Last attempt to quit: 02/02/2018  . Years since quitting: 0.5  Smokeless Tobacco Never Used  Tobacco Comment   Quit 01/2018 - on 05/01/2018 talked to Lamone about Relaspe concerns. He ststaes that he has no taste  for tobacco since he quit in Feb.    Labs: Recent Review Flowsheet Data    Labs for ITP Cardiac and Pulmonary Rehab Latest Ref Rng & Units 02/19/2018 02/19/2018 02/20/2018 02/20/2018 03/27/2018   Cholestrol 100 - 199 mg/dL - - - - 102   LDLCALC 0 - 99 mg/dL - - - - 50   HDL >39 mg/dL - - - - 35(L)   Trlycerides 0 - 149 mg/dL - - - - 84   Hemoglobin A1c 4.8 - 5.6 % - - - - -   PHART 7.350 - 7.450 - 7.335(L) 7.361 7.331(L) -   PCO2ART 32.0 - 48.0 mmHg - 40.2 40.1 41.0 -   HCO3 20.0 - 28.0 mmol/L - 21.2 22.5 21.5 -   TCO2 22 - 32 mmol/L _0 -   ACIDBASEDEF 0.0 - 2.0 mmol/L - 4.0(H) 2.0 4.0(H) -   O2SAT % - 97.0 97.0 95.0 -       Exercise Target Goals: Exercise Program Goal: Individual exercise prescription set using results from initial 6 min walk test and THRR while considering  patient's activity barriers and safety.   Exercise Prescription Goal: Initial exercise prescription builds to 30-45 minutes a day of aerobic activity, 2-3 days per week.  Home exercise guidelines will be given to patient during program as part of exercise prescription that the participant will acknowledge.  Activity Barriers & Risk Stratification: Activity Barriers & Cardiac Risk Stratification - 05/01/18 1450      Activity Barriers & Cardiac Risk Stratification   Activity Barriers  Back Problems   HIstory of back injury years ago.  He has lidocaine patches and epidurals as needed      6 Minute Walk: 6 Minute Walk    Row Name 05/01/18 1406 08/05/18 1048       6 Minute Walk   Phase  -  Discharge    Distance  888 feet  1042 feet    Distance % Change  -  17 %    Distance Feet Change  -  154 ft    Walk Time  6 minutes  6 minutes    # of Rest Breaks  0  0    MPH  1.68  1.97    METS  2.04  2.19    RPE  11  13    Perceived Dyspnea   0  1    VO2 Peak  7.17  7.69    Symptoms  No  Yes (comment)    Comments  -  calf pain 8/10 (normally takes lidocaine     Resting HR  78 bpm  82  bpm    Resting BP   134/64  126/64    Resting Oxygen Saturation   94 %  93 %    Exercise Oxygen Saturation  during 6 min walk  94 %  91 %    Max Ex. HR  110 bpm  93 bpm    Max Ex. BP  126/60  142/76    2 Minute Post BP  116/68  -      Interval HR   1 Minute HR  -  85    2 Minute HR  -  81    3 Minute HR  -  90    4 Minute HR  -  92    6 Minute HR  -  93    Interval Heart Rate?  -  Yes      Interval Oxygen   Interval Oxygen?  -  Yes    Baseline Oxygen Saturation %  -  93 %    1 Minute Oxygen Saturation %  -  93 %    1 Minute Liters of Oxygen  -  0 L    2 Minute Oxygen Saturation %  -  92 %    2 Minute Liters of Oxygen  -  0 L    3 Minute Oxygen Saturation %  -  92 %    3 Minute Liters of Oxygen  -  0 L    4 Minute Oxygen Saturation %  -  92 %    4 Minute Liters of Oxygen  -  0 L    5 Minute Liters of Oxygen  -  0 L    6 Minute Oxygen Saturation %  -  91 %    6 Minute Liters of Oxygen  -  0 L    2 Minute Post Liters of Oxygen  -  0 L       Oxygen Initial Assessment:   Oxygen Re-Evaluation:   Oxygen Discharge (Final Oxygen Re-Evaluation):   Initial Exercise Prescription: Initial Exercise Prescription - 05/01/18 1400      Date of Initial Exercise RX and Referring Provider   Date  05/01/18    Referring Provider  Arida      Treadmill   MPH  1.5    Grade  0    Minutes  15    METs  2.15      Recumbant Bike   Level  1    RPM  60    Watts  16    Minutes  15    METs  2      NuStep   Level  2    SPM  80    Minutes  15    METs  2      Prescription Details   Frequency (times per week)  2    Duration  Progress to 30 minutes of continuous aerobic without signs/symptoms of physical distress      Intensity   THRR 40-80% of Max Heartrate  105-133    Ratings of Perceived Exertion  11-13    Perceived Dyspnea  0-4      Resistance Training   Training Prescription  Yes    Weight  3 lb    Reps  10-15       Perform Capillary Blood Glucose checks as needed.  Exercise  Prescription Changes: Exercise Prescription Changes    Row Name 05/01/18 1400 05/13/18 1500 06/03/18 0900 06/12/18 0800 07/10/18 1100  Response to Exercise   Blood Pressure (Admit)  134/64  132/64  132/64  110/64  116/62   Blood Pressure (Exercise)  126/60  128/60  128/60  120/68  144/72   Blood Pressure (Exit)  116/68  128/60  128/60  124/64  152/68   Heart Rate (Admit)  72 bpm  88 bpm  88 bpm  61 bpm  91 bpm   Heart Rate (Exercise)  93 bpm  94 bpm  94 bpm  106 bpm  104 bpm   Heart Rate (Exit)  63 bpm  90 bpm  90 bpm  78 bpm  81 bpm   Oxygen Saturation (Admit)  94 %  -  -  -  -   Rating of Perceived Exertion (Exercise)  _0 Symptoms  -  none  none  none  none   Comments  -  second full day of exercise  second full day of exercise  -  -   Duration  -  Continue with 30 min of aerobic exercise without signs/symptoms of physical distress.  Continue with 30 min of aerobic exercise without signs/symptoms of physical distress.  Continue with 30 min of aerobic exercise without signs/symptoms of physical distress.  Continue with 30 min of aerobic exercise without signs/symptoms of physical distress.   Intensity  -  THRR unchanged  THRR unchanged  THRR unchanged  THRR unchanged     Progression   Progression  -  Continue to progress workloads to maintain intensity without signs/symptoms of physical distress.  Continue to progress workloads to maintain intensity without signs/symptoms of physical distress.  Continue to progress workloads to maintain intensity without signs/symptoms of physical distress.  Continue to progress workloads to maintain intensity without signs/symptoms of physical distress.   Average METs  -  2.4  2.4  2.28  2.5     Resistance Training   Training Prescription  -  Yes  Yes  Yes  Yes   Weight  -  3 lbs  3 lbs  3 lbs  5 lbs   Reps  -  10-15  10-15  10-15  10-15     Interval Training   Interval Training  -  No  No  No  No     Treadmill   MPH  -  -  -   1.5  -   Grade  -  -  -  0  -   Minutes  -  -  -  15  -   METs  -  -  -  2.15  -     NuStep   Level  -  _1 Minutes  -  _2 METs  -  3  3  2.9  2.7     Arm Ergometer   Level  -  _3 Minutes  -  _4 METs  -  1.8  1.8  1.9  2.3     Home Exercise Plan   Plans to continue exercise at  -  -  Home (comment)  Home (comment)  Home (comment)   Frequency  -  -  Add 2 additional days to program exercise sessions.  Add 2 additional days to program exercise sessions.  Add 2 additional days to  program exercise sessions.   Initial Home Exercises Provided  -  -  06/03/18  06/03/18  06/03/18   Row Name 07/24/18 1100 08/07/18 1100 08/19/18 1600         Response to Exercise   Blood Pressure (Admit)  134/56  106/64  146/64     Blood Pressure (Exercise)  -  142/76  128/60     Blood Pressure (Exit)  128/60  113/68  134/64     Heart Rate (Admit)  73 bpm  82 bpm  71 bpm     Heart Rate (Exercise)  80 bpm  154 bpm  72 bpm     Heart Rate (Exit)  75 bpm  59 bpm  73 bpm     Rating of Perceived Exertion (Exercise)  123  12  12     Symptoms  none  none  none     Comments  first day back after 2 weeks   -  -     Duration  Continue with 30 min of aerobic exercise without signs/symptoms of physical distress.  Continue with 30 min of aerobic exercise without signs/symptoms of physical distress.  Continue with 30 min of aerobic exercise without signs/symptoms of physical distress.     Intensity  THRR unchanged  THRR unchanged  THRR unchanged       Progression   Progression  Continue to progress workloads to maintain intensity without signs/symptoms of physical distress.  Continue to progress workloads to maintain intensity without signs/symptoms of physical distress.  Continue to progress workloads to maintain intensity without signs/symptoms of physical distress.     Average METs  2.15  1.8  2.3       Resistance Training   Training Prescription  Yes  Yes  Yes      Weight  5 lbs  5 lbs  5 lbs     Reps  10-15  10-15  10-15       Interval Training   Interval Training  No  -  No       Treadmill   MPH  -  1.5  -     Grade  -  0  -     Minutes  -  15  -     METs  -  2.15  -       NuStep   Level  _0 Minutes  _1 METs  2.8  2.8  2.5       Arm Ergometer   Level  _2 Minutes  _3 METs  1.5  1.8  2.1       Home Exercise Plan   Plans to continue exercise at  Home (comment)  Home (comment)  Home (comment)     Frequency  Add 2 additional days to program exercise sessions.  Add 2 additional days to program exercise sessions.  Add 2 additional days to program exercise sessions.     Initial Home Exercises Provided  06/03/18  06/03/18  06/03/18        Exercise Comments: Exercise Comments    Row Name 05/08/18 0932 06/03/18 0942 07/10/18 1121       Exercise Comments  First full day of exercise!  Patient was oriented to gym and equipment including functions, settings, policies, and procedures.  Patient's individual exercise  prescription and treatment plan were reviewed.  All starting workloads were established based on the results of the 6 minute walk test done at initial orientation visit.  The plan for exercise progression was also introduced and progression will be customized based on patient's performance and goals.  Reviewed home exercise with pt today.  Pt plans to walk outside and use DB for exercise.  Reviewed THR, pulse, RPE, sign and symptoms, NTG use, and when to call 911 or MD.  Also discussed weather considerations and indoor options.  Pt voiced understanding.  Pt. has been on vacation since 07/01/2018.        Exercise Goals and Review: Exercise Goals    Row Name 05/01/18 1405             Exercise Goals   Increase Physical Activity  Yes       Intervention  Provide advice, education, support and counseling about physical activity/exercise needs.;Develop an individualized exercise prescription for  aerobic and resistive training based on initial evaluation findings, risk stratification, comorbidities and participant's personal goals.       Expected Outcomes  Short Term: Attend rehab on a regular basis to increase amount of physical activity.;Long Term: Add in home exercise to make exercise part of routine and to increase amount of physical activity.;Long Term: Exercising regularly at least 3-5 days a week.       Increase Strength and Stamina  Yes       Intervention  Provide advice, education, support and counseling about physical activity/exercise needs.;Develop an individualized exercise prescription for aerobic and resistive training based on initial evaluation findings, risk stratification, comorbidities and participant's personal goals.       Expected Outcomes  Short Term: Increase workloads from initial exercise prescription for resistance, speed, and METs.;Short Term: Perform resistance training exercises routinely during rehab and add in resistance training at home;Long Term: Improve cardiorespiratory fitness, muscular endurance and strength as measured by increased METs and functional capacity (6MWT)       Able to understand and use rate of perceived exertion (RPE) scale  Yes       Intervention  Provide education and explanation on how to use RPE scale       Expected Outcomes  Short Term: Able to use RPE daily in rehab to express subjective intensity level;Long Term:  Able to use RPE to guide intensity level when exercising independently       Able to understand and use Dyspnea scale  Yes       Intervention  Provide education and explanation on how to use Dyspnea scale       Expected Outcomes  Short Term: Able to use Dyspnea scale daily in rehab to express subjective sense of shortness of breath during exertion;Long Term: Able to use Dyspnea scale to guide intensity level when exercising independently       Knowledge and understanding of Target Heart Rate Range (THRR)  Yes        Intervention  Provide education and explanation of THRR including how the numbers were predicted and where they are located for reference       Expected Outcomes  Short Term: Able to state/look up THRR;Short Term: Able to use daily as guideline for intensity in rehab;Long Term: Able to use THRR to govern intensity when exercising independently       Able to check pulse independently  Yes       Intervention  Provide education and demonstration on how to check pulse in  carotid and radial arteries.;Review the importance of being able to check your own pulse for safety during independent exercise       Expected Outcomes  Short Term: Able to explain why pulse checking is important during independent exercise;Long Term: Able to check pulse independently and accurately       Understanding of Exercise Prescription  Yes       Intervention  Provide education, explanation, and written materials on patient's individual exercise prescription       Expected Outcomes  Short Term: Able to explain program exercise prescription;Long Term: Able to explain home exercise prescription to exercise independently          Exercise Goals Re-Evaluation : Exercise Goals Re-Evaluation    Row Name 05/08/18 0932 05/13/18 1511 05/27/18 1458 05/29/18 0946 06/03/18 0942     Exercise Goal Re-Evaluation   Exercise Goals Review  Increase Physical Activity;Able to understand and use rate of perceived exertion (RPE) scale;Understanding of Exercise Prescription;Knowledge and understanding of Target Heart Rate Range (THRR)  Increase Physical Activity;Understanding of Exercise Prescription;Increase Strength and Stamina  -  Increase Physical Activity;Understanding of Exercise Prescription;Increase Strength and Stamina  Increase Physical Activity;Increase Strength and Stamina;Able to understand and use rate of perceived exertion (RPE) scale;Knowledge and understanding of Target Heart Rate Range (THRR);Able to check pulse  independently;Understanding of Exercise Prescription   Comments  Reviewed RPE scale, THR and program prescription with pt today.  Pt voiced understanding and was given a copy of goals to take home.   Xai is off to a good start in rehab.  He is already getting 3 METs on the NuStep.  We will start to increase his workloads and continue to monitor his progress.   Out since last review  Alyx returned to exercise today after a trip home.  He continues to feel good.  His ultimate goal is to get back on his bike.  He does feel like he is starting to get his strength back and should be able to handle his bike once cleared by surgeon to ride.    He does get is some exercise at home on his off but has not really been tracking it much.  We will review home exercise guidelines soon.   Reviewed home exercise with pt today.  Pt plans to walk outside and use DB for exercise.  Reviewed THR, pulse, RPE, sign and symptoms, NTG use, and when to call 911 or MD.  Also discussed weather considerations and indoor options.  Pt voiced understanding.   Expected Outcomes  Short: Use RPE daily to regulate intensity.  Long: Follow program prescription in THR.  Short: Increase workloads and review home exercise.  Long: Continue to work on Education administrator.   -  Short: Review home exercise guidelines.  Long: Continue to build strength to ride bike.   Short - continue to exercise on days not at [facility] Long - maintain exercise after completing HT   Row Name 06/12/18 0827 07/01/18 1011 07/24/18 1010 08/07/18 1122 08/19/18 1648     Exercise Goal Re-Evaluation   Exercise Goals Review  Increase Physical Activity;Understanding of Exercise Prescription;Increase Strength and Stamina  Increase Physical Activity;Increase Strength and Stamina;Understanding of Exercise Prescription  Increase Physical Activity;Increase Strength and Stamina;Understanding of Exercise Prescription  Increase Physical Activity;Increase Strength and  Stamina;Understanding of Exercise Prescription  Increase Physical Activity;Increase Strength and Stamina;Understanding of Exercise Prescription   Comments  Riccardo has been doing well in rehab.  He continues do what he wants  and is not motivated to increase workloads.  He will be switching class to our new 900 class next week. He was able to finally get back on his bike again!!  We will continue to monitor his progression.  Ellias has been doing well in rehab.  He has still been able to ride his bike some, however, with the heat this week he has not been able to get out.  He has been walking at home and walking in pool too.  He is feeling stronger and has more stamina.   Rahiem has been on vacaiton in [place] for 2 weeks to pack up his house. He stated that he walked everyday while he was there. He is glad to be back and get back in the routine of exercising.   Braheem has been doing well since returning to rehab. He uses the arm crank and nu step primarily. He continues to walk at home, and has been riding his motorcycle since the weather has been warm.  Keimon continues to do well in rehab.  He is nearing graduation and improved his post 6MWT by 17%!  We will continue to monitor his progress.    Expected Outcomes  Short: Continue to exercise at home and try to increase workloads. Long: Continue to exercise independently  Short: Continue to walk at home.  Long: Continue to regain strength and stamina.   Short: return to rehab regularly. Long: increase workloads with incresaed strength.   Short: continue to attend rehab regularly. Long: increase to 3 additional days of exercise at home   Short: Graduate!!  Long: Continue to exercise on his own after graduation.       Discharge Exercise Prescription (Final Exercise Prescription Changes): Exercise Prescription Changes - 08/19/18 1600      Response to Exercise   Blood Pressure (Admit)  146/64    Blood Pressure (Exercise)  128/60    Blood Pressure (Exit)  134/64     Heart Rate (Admit)  71 bpm    Heart Rate (Exercise)  72 bpm    Heart Rate (Exit)  73 bpm    Rating of Perceived Exertion (Exercise)  12    Symptoms  none    Duration  Continue with 30 min of aerobic exercise without signs/symptoms of physical distress.    Intensity  THRR unchanged      Progression   Progression  Continue to progress workloads to maintain intensity without signs/symptoms of physical distress.    Average METs  2.3      Resistance Training   Training Prescription  Yes    Weight  5 lbs    Reps  10-15      Interval Training   Interval Training  No      NuStep   Level  7    Minutes  15    METs  2.5      Arm Ergometer   Level  1    Minutes  15    METs  2.1      Home Exercise Plan   Plans to continue exercise at  Home (comment)    Frequency  Add 2 additional days to program exercise sessions.    Initial Home Exercises Provided  06/03/18       Nutrition:  Target Goals: Understanding of nutrition guidelines, daily intake of sodium '1500mg'$ , cholesterol '200mg'$ , calories 30% from fat and 7% or less from saturated fats, daily to have 5 or more servings of fruits and vegetables.  Biometrics: Pre Biometrics - 05/01/18 1404      Pre Biometrics   Height  '5\' 5"'$  (1.651 m)    Weight  163 lb 12.8 oz (74.3 kg)    Waist Circumference  38.5 inches    Hip Circumference  38.5 inches    Waist to Hip Ratio  1 %    BMI (Calculated)  27.26    Single Leg Stand  27.92 seconds      Post Biometrics - 08/05/18 1047       Post  Biometrics   Height  '5\' 5"'$  (1.651 m)    Weight  172 lb (78 kg)    Waist Circumference  40.5 inches    Hip Circumference  40.5 inches    Waist to Hip Ratio  1 %    BMI (Calculated)  28.62    Single Leg Stand  14.62 seconds       Nutrition Therapy Plan and Nutrition Goals: Nutrition Therapy & Goals - 06/09/18 0911      Nutrition Therapy   RD appointment deferred  Yes       Nutrition Assessments: Nutrition Assessments - 08/26/18 1352       MEDFICTS Scores   Pre Score  34       Nutrition Goals Re-Evaluation: Nutrition Goals Re-Evaluation    Sunol Name 05/29/18 0951 07/01/18 1017 07/24/18 1017         Goals   Current Weight  166 lb (75.3 kg)  -  171 lb (77.6 kg)     Nutrition Goal  Meet with dietician  Continue to eat healthy  Eat a heart healthy diet, monitor sodium intake.         Comment  Jamarii would like to work on gaining muscle weight and staying lean.  He has a pretty good diet.  We made him an appointment to meet with dietician after class next Thursday (6/20).    Zacharey declined meeting with Lattie Haw during class to set goals.  He continues to focus on healthy eating and getting fruits and vegetables.  He has tried the veggie pastas and really likes them. He is using the air fryer more and more.    His weight is up just a little bit after his trip to [place]. He stated that since he's back he is going to refocus on eating healthy, friuits, vegetables. Still using his air fryer.      Expected Outcome  Short: Meet with dietician.  Long: Continue to follow recommendations to gain muscle weight  Short: Continue to try new recipes.  Long: Continue to work on gaining weight.   Short: refocus on his heart healthy diet. Long: gain a few more pounds and maintain a healthy 172.         Nutrition Goals Discharge (Final Nutrition Goals Re-Evaluation): Nutrition Goals Re-Evaluation - 07/24/18 1017      Goals   Current Weight  171 lb (77.6 kg)    Nutrition Goal  Eat a heart healthy diet, monitor sodium intake.        Comment  His weight is up just a little bit after his trip to [place]. He stated that since he's back he is going to refocus on eating healthy, friuits, vegetables. Still using his air fryer.     Expected Outcome  Short: refocus on his heart healthy diet. Long: gain a few more pounds and maintain a healthy 172.        Psychosocial: Target Goals: Acknowledge presence  or absence of significant depression and/or  stress, maximize coping skills, provide positive support system. Participant is able to verbalize types and ability to use techniques and skills needed for reducing stress and depression.   Initial Review & Psychosocial Screening: Initial Psych Review & Screening - 05/01/18 1448      Initial Review   Current issues with  None Identified      Family Dynamics   Good Support System?  --   Significant other  Family and Friends     Barriers   Psychosocial barriers to participate in program  The patient should benefit from training in stress management and relaxation.;There are no identifiable barriers or psychosocial needs.      Screening Interventions   Interventions  Encouraged to exercise;To provide support and resources with identified psychosocial needs;Provide feedback about the scores to participant    Expected Outcomes  Short Term goal: Utilizing psychosocial counselor, staff and physician to assist with identification of specific Stressors or current issues interfering with healing process. Setting desired goal for each stressor or current issue identified.;Long Term Goal: Stressors or current issues are controlled or eliminated.;Short Term goal: Identification and review with participant of any Quality of Life or Depression concerns found by scoring the questionnaire.;Long Term goal: The participant improves quality of Life and PHQ9 Scores as seen by post scores and/or verbalization of changes       Quality of Life Scores:  Quality of Life - 08/26/18 1352      Quality of Life   Select  Quality of Life      Quality of Life Scores   Health/Function Pre  27.83 %    Health/Function Post  30 %    Health/Function % Change  7.8 %    Socioeconomic Pre  27.86 %    Socioeconomic Post  30 %    Socioeconomic % Change   7.68 %    Psych/Spiritual Pre  30 %    Psych/Spiritual Post  30 %    Psych/Spiritual % Change  0 %    Family Pre  30 %    Family Post  30 %    Family % Change  0 %     GLOBAL Pre  28.6 %    GLOBAL Post  30 %    GLOBAL % Change  4.9 %      Scores of 19 and below usually indicate a poorer quality of life in these areas.  A difference of  2-3 points is a clinically meaningful difference.  A difference of 2-3 points in the total score of the Quality of Life Index has been associated with significant improvement in overall quality of life, self-image, physical symptoms, and general health in studies assessing change in quality of life.  PHQ-9: Recent Review Flowsheet Data    Depression screen [facility] 2/9 08/26/2018 05/01/2018   Decreased Interest 0 0   Down, Depressed, Hopeless 0 0   PHQ - 2 Score 0 0   Altered sleeping 0 0   Tired, decreased energy 1 0   Change in appetite - 0   Feeling bad or failure about yourself  0 0   Trouble concentrating 0 0   Moving slowly or fidgety/restless 0 0   Suicidal thoughts 0 0   PHQ-9 Score 1 0   Difficult doing work/chores Not difficult at all Not difficult at all     Interpretation of Total Score  Total Score Depression Severity:  1-4 = Minimal depression, 5-9 =  Mild depression, 10-14 = Moderate depression, 15-19 = Moderately severe depression, 20-27 = Severe depression   Psychosocial Evaluation and Intervention: Psychosocial Evaluation - 05/08/18 0934      Psychosocial Evaluation & Interventions   Interventions  Encouraged to exercise with the program and follow exercise prescription    Comments  Counselor met with Mr. Bearce Eggenberger) today for initial psychosocial evaluation.  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  Emoni has a strong support system with a fiance and (3) daughters he speaks with daily as the daughters are in [place].  Sota reports sleeping better since his surgery and having a good appetite.  He denies a history of depression or anxiety other than some symptoms when his spouse passed 4 years ago; he denies any current symptoms.  Giordan states he is typically positive and has minimal stress in his  life.  He has goals to be healthy enough to get back on his motorcycle and drive to [place] to visit family.  Staff will follow with Deidre Ala.    Expected Outcomes  Short:  Vi will exercise consistently to increase his stamin and strength.   Long:  Karsten will be strong enough to ride his motorcycle to [place] to visit family.     Continue Psychosocial Services   Follow up required by staff       Psychosocial Re-Evaluation: Psychosocial Re-Evaluation    Row Name 05/29/18 3007 07/01/18 1015 07/24/18 1013 08/12/18 1036       Psychosocial Re-Evaluation   Current issues with  None Identified  None Identified  Current Anxiety/Panic;Current Stress Concerns  Current Stress Concerns    Comments  Alvaro has been doing well in rehab.  He has minimal stress in his life.  He really wants to get back on his bike.  He has an appointment with Dr. Roxy Manns on 6/24 to talk about this.  He feels that he has regained enough strength to ride again. After his trip, he also now has his own car back which makes him happier to have the freedom back.  He sleeps well and gets about 6 hours of sleep a night.  Kalyan has been able to return to riding his bike (except this week with weather).  He is also restarted his supplements and using his CBD oil again.  Thus he is sleeping better again.  He conintues to maintain status quo and a postive attitude.  The heat of summer prevents him from doing what he wants to do outside.   Trevonne stated it was stressful for him to leave [place]. It caused him some anxiety leaving his children and old house behind. It caused him some excess SOB but he has began to cope since being back.   Lashawn states his stress and anxiety are gone since they have completed the process to move to [facility].   He is able to ride his motorcycle which helps him manage stress.    Expected Outcomes  Short: Talk to doctor about getting back on bike.  Long: Continue to stay postive.   Short: Continue to get out to ride early in day.  Long:  Continue to stay positive.    Short: remain postive, stay active to keep his mind off of things. Long: Keep in touch with his family, positively cope with being so far away.   Short - continue to exercise and ride motorcycle to control stress Long - manage stress on his own    Interventions  -  Encouraged to attend Cardiac Rehabilitation for the exercise  -  -    Continue Psychosocial Services   -  Follow up required by staff  Follow up required by staff  -       Psychosocial Discharge (Final Psychosocial Re-Evaluation): Psychosocial Re-Evaluation - 08/12/18 1036      Psychosocial Re-Evaluation   Current issues with  Current Stress Concerns    Comments  Kanan states his stress and anxiety are gone since they have completed the process to move to [place].   He is able to ride his motorcycle which helps him manage stress.    Expected Outcomes  Short - continue to exercise and ride motorcycle to control stress Long - manage stress on his own       Vocational Rehabilitation: Provide vocational rehab assistance to qualifying candidates.   Vocational Rehab Evaluation & Intervention: Vocational Rehab - 05/01/18 1450      Initial Vocational Rehab Evaluation & Intervention   Assessment shows need for Vocational Rehabilitation  No       Education: Education Goals: Education classes will be provided on a variety of topics geared toward better understanding of heart health and risk factor modification. Participant will state understanding/return demonstration of topics presented as noted by education test scores.  Learning Barriers/Preferences: Learning Barriers/Preferences - 05/01/18 1449      Learning Barriers/Preferences   Learning Barriers  None    Learning Preferences  None       Education Topics:  AED/CPR: - Group verbal and written instruction with the use of models to demonstrate the basic use of the AED with the basic ABC's of resuscitation.   Cardiac Rehab from 08/26/2018 in [facility]  Cardiac and Pulmonary Rehab  Date  05/08/18  Educator  CE  Instruction Review Code  1- Verbalizes Understanding      General Nutrition Guidelines/Fats and Fiber: -Group instruction provided by verbal, written material, models and posters to present the general guidelines for heart healthy nutrition. Gives an explanation and review of dietary fats and fiber.   Cardiac Rehab from 08/26/2018 in [facility] Cardiac and Pulmonary Rehab  Date  08/26/18  Educator  LB  Instruction Review Code  1- Verbalizes Understanding      Controlling Sodium/Reading Food Labels: -Group verbal and written material supporting the discussion of sodium use in heart healthy nutrition. Review and explanation with models, verbal and written materials for utilization of the food label.   Cardiac Rehab from 08/26/2018 in [facility] Cardiac and Pulmonary Rehab  Date  05/13/18  Educator  PI  Instruction Review Code  1- Verbalizes Understanding      Exercise Physiology & General Exercise Guidelines: - Group verbal and written instruction with models to review the exercise physiology of the cardiovascular system and associated critical values. Provides general exercise guidelines with specific guidelines to those with heart or lung disease.    Cardiac Rehab from 08/26/2018 in [facility] Cardiac and Pulmonary Rehab  Date  05/20/18  Educator  AS  Instruction Review Code  4- No Evidence of Learning [charting in error]      Aerobic Exercise & Resistance Training: - Gives group verbal and written instruction on the various components of exercise. Focuses on aerobic and resistive training programs and the benefits of this training and how to safely progress through these programs..   Cardiac Rehab from 08/26/2018 in [facility] Cardiac and Pulmonary Rehab  Date  07/24/18  Educator  AS  Instruction Review Code  1- Verbalizes Understanding  Flexibility, Balance, Mind/Body Relaxation: Provides group verbal/written instruction on the benefits  of flexibility and balance training, including mind/body exercise modes such as yoga, pilates and tai chi.  Demonstration and skill practice provided.   Cardiac Rehab from 08/26/2018 in [facility] Cardiac and Pulmonary Rehab  Date  07/29/18  Educator  AS  Instruction Review Code  1- Verbalizes Understanding      Stress and Anxiety: - Provides group verbal and written instruction about the health risks of elevated stress and causes of high stress.  Discuss the correlation between heart/lung disease and anxiety and treatment options. Review healthy ways to manage with stress and anxiety.   Cardiac Rehab from 08/26/2018 in [facility] Cardiac and Pulmonary Rehab  Date  08/05/18  Educator  [facility]  Instruction Review Code  1- Verbalizes Understanding      Depression: - Provides group verbal and written instruction on the correlation between heart/lung disease and depressed mood, treatment options, and the stigmas associated with seeking treatment.   Anatomy & Physiology of the Heart: - Group verbal and written instruction and models provide basic cardiac anatomy and physiology, with the coronary electrical and arterial systems. Review of Valvular disease and Heart Failure   Cardiac Rehab from 08/26/2018 in [facility] Cardiac and Pulmonary Rehab  Date  08/07/18  Educator  CE  Instruction Review Code  1- Verbalizes Understanding      Cardiac Procedures: - Group verbal and written instruction to review commonly prescribed medications for heart disease. Reviews the medication, class of the drug, and side effects. Includes the steps to properly store meds and maintain the prescription regimen. (beta blockers and nitrates)   Cardiac Medications I: - Group verbal and written instruction to review commonly prescribed medications for heart disease. Reviews the medication, class of the drug, and side effects. Includes the steps to properly store meds and maintain the prescription regimen.   Cardiac Rehab from 08/26/2018  in [facility] Cardiac and Pulmonary Rehab  Date  08/12/18  Educator  SB  Instruction Review Code  1- Verbalizes Understanding      Cardiac Medications II: -Group verbal and written instruction to review commonly prescribed medications for heart disease. Reviews the medication, class of the drug, and side effects. (all other drug classes)   Cardiac Rehab from 08/26/2018 in [facility] Cardiac and Pulmonary Rehab  Date  07/31/18  Educator  CE  Instruction Review Code  1- Verbalizes Understanding       Go Sex-Intimacy & Heart Disease, Get SMART - Goal Setting: - Group verbal and written instruction through game format to discuss heart disease and the return to sexual intimacy. Provides group verbal and written material to discuss and apply goal setting through the application of the S.M.A.R.T. Method.   Other Matters of the Heart: - Provides group verbal, written materials and models to describe Stable Angina and Peripheral Artery. Includes description of the disease process and treatment options available to the cardiac patient.   Cardiac Rehab from 08/26/2018 in [facility] Cardiac and Pulmonary Rehab  Date  08/07/18  Educator  CE  Instruction Review Code  1- Verbalizes Understanding      Exercise & Equipment Safety: - Individual verbal instruction and demonstration of equipment use and safety with use of the equipment.   Cardiac Rehab from 08/26/2018 in [facility] Cardiac and Pulmonary Rehab  Date  05/01/18  Educator  [facility]  Instruction Review Code  1- Verbalizes Understanding      Infection Prevention: - Provides verbal and written material to individual with  discussion of infection control including proper hand washing and proper equipment cleaning during exercise session.   Cardiac Rehab from 08/26/2018 in [facility] Cardiac and Pulmonary Rehab  Date  05/01/18  Educator  mc  Instruction Review Code  1- Verbalizes Understanding      Falls Prevention: - Provides verbal and written material to individual  with discussion of falls prevention and safety.   Cardiac Rehab from 08/26/2018 in [facility] Cardiac and Pulmonary Rehab  Date  05/01/18  Educator  mc  Instruction Review Code  1- Verbalizes Understanding      Diabetes: - Individual verbal and written instruction to review signs/symptoms of diabetes, desired ranges of glucose level fasting, after meals and with exercise. Acknowledge that pre and post exercise glucose checks will be done for 3 sessions at entry of program.   Cardiac Rehab from 08/26/2018 in [facility] Cardiac and Pulmonary Rehab  Date  05/01/18  Educator  [facility]  Instruction Review Code  1- Verbalizes Understanding      Know Your Numbers and Risk Factors: -Group verbal and written instruction about important numbers in your health.  Discussion of what are risk factors and how they play a role in the disease process.  Review of Cholesterol, Blood Pressure, Diabetes, and BMI and the role they play in your overall health.   Cardiac Rehab from 08/26/2018 in [facility] Cardiac and Pulmonary Rehab  Date  07/31/18  Educator  CE  Instruction Review Code  1- Verbalizes Understanding      Sleep Hygiene: -Provides group verbal and written instruction about how sleep can affect your health.  Define sleep hygiene, discuss sleep cycles and impact of sleep habits. Review good sleep hygiene tips.    Cardiac Rehab from 08/26/2018 in [facility] Cardiac and Pulmonary Rehab  Date  08/19/18  Educator  [facility]  Instruction Review Code  1- Verbalizes Understanding [Arrived late to class]      Other: -Provides group and verbal instruction on various topics (see comments)   Knowledge Questionnaire Score: Knowledge Questionnaire Score - 08/26/18 1351      Knowledge Questionnaire Score   Pre Score  26/28    Post Score  24/26   test reviewed with pt today      Core Components/Risk Factors/Patient Goals at Admission: Personal Goals and Risk Factors at Admission - 05/01/18 1443      Core Components/Risk  Factors/Patient Goals on Admission    Weight Management  Weight Maintenance;Yes    Intervention  Weight Management: Develop a combined nutrition and exercise program designed to reach desired caloric intake, while maintaining appropriate intake of nutrient and fiber, sodium and fats, and appropriate energy expenditure required for the weight goal.;Weight Management: Provide education and appropriate resources to help participant work on and attain dietary goals.    Admit Weight  163 lb 12.8 oz (74.3 kg)   Was in hospital for over 4 weeks, loos muscle mass and weight. Wants to regain muscle mass.    Goal Weight: Short Term  165 lb (74.8 kg)    Goal Weight: Long Term  170 lb (77.1 kg)    Expected Outcomes  Short Term: Continue to assess and modify interventions until short term weight is achieved;Long Term: Adherence to nutrition and physical activity/exercise program aimed toward attainment of established weight goal;Weight Maintenance: Understanding of the daily nutrition guidelines, which includes 25-35% calories from fat, 7% or less cal from saturated fats, less than '200mg'$  cholesterol, less than 1.5gm of sodium, & 5 or more servings of fruits  and vegetables daily    Diabetes  Yes    Intervention  Provide education about signs/symptoms and action to take for hypo/hyperglycemia.;Provide education about proper nutrition, including hydration, and aerobic/resistive exercise prescription along with prescribed medications to achieve blood glucose in normal ranges: Fasting glucose 65-99 mg/dL    Expected Outcomes  Short Term: Participant verbalizes understanding of the signs/symptoms and immediate care of hyper/hypoglycemia, proper foot care and importance of medication, aerobic/resistive exercise and nutrition plan for blood glucose control.;Long Term: Attainment of HbA1C < 7%.    Hypertension  Yes    Intervention  Provide education on lifestyle modifcations including regular physical activity/exercise,  weight management, moderate sodium restriction and increased consumption of fresh fruit, vegetables, and low fat dairy, alcohol moderation, and smoking cessation.;Monitor prescription use compliance.    Expected Outcomes  Short Term: Continued assessment and intervention until BP is < 140/75m HG in hypertensive participants. < 130/865mHG in hypertensive participants with diabetes, heart failure or chronic kidney disease.;Long Term: Maintenance of blood pressure at goal levels.    Lipids  Yes    Intervention  Provide education and support for participant on nutrition & aerobic/resistive exercise along with prescribed medications to achieve LDL '70mg'$ , HDL >'40mg'$ .    Expected Outcomes  Short Term: Participant states understanding of desired cholesterol values and is compliant with medications prescribed. Participant is following exercise prescription and nutrition guidelines.;Long Term: Cholesterol controlled with medications as prescribed, with individualized exercise RX and with personalized nutrition plan. Value goals: LDL < '70mg'$ , HDL > 40 mg.    Personal Goal Other  Yes    Personal Goal  Wants to get back to riding his [place] muscle mass will in the hospital for over 4 weeks    Intervention  Provide exercise prescription to work on musvcle mass gain, strength and stamina    Expected Outcomes  ST: RaJahariill start to build his muscle strength and stamina while following his exercise prescription. LTWU:[id number] be able to ride his HaMarkus Daftgain without concern       Core Components/Risk Factors/Patient Goals Review:  Goals and Risk Factor Review    Row Name 05/29/18 0948 07/01/18 1013 07/24/18 1021 08/12/18 1032       Core Components/Risk Factors/Patient Goals Review   Personal Goals Review  Weight Management/Obesity;Lipids;Hypertension;Diabetes  Weight Management/Obesity;Lipids;Hypertension;Diabetes  Weight Management/Obesity;Lipids;Hypertension;Diabetes  Weight  Management/Obesity;Lipids;Hypertension;Diabetes    Review  RaArnelas been trying to gain muscle weight back.  He is up to 166 lbs and feeling stronger.  His blood pressures have been doing well and he checks them on occasion at home.  We talked about increase the frequency he checks his pressures at home.  Tayshon's blood sugars have been doing well.  Today he was 147 mg/dl fasting.  He checks it daily in the morning.  He continues to do well on his medications and would like to get back on CBD oil and other supplements.  He has an appointmnent with Dr. OwRoxy Mannsn the 24th and will ask him about those then.    RaDemarcuss doing well on his blood pressure and still checks them routinely at home. He continues to check his blood sugars at home as well.  He still gaining weight, he has about five more to go to get back to where he wants to be.  He is still doing well on his medications and has been able to restart his supplements and CBD oil. He is also sleeping better again.   RaDeidre Ala  checked his blood pressures and blood sugar regulary while he was away and stated they were good. Since returning he is only about a pound away from where he wants to be but wants the weight to be gained in a healthy manner. Planning to restart his supplements and CBD oil since returning home.   Dorrance had blood tests last week - cholesterol and glucose were all in normal to low range.  He is taking all meds as directed. His BP and BG have been good at home.  He does get cramps in his legs sometimes - esp after riding his motorcycle.  He can put lidocaine on them  and that relieves cramps.  He feels like he has good energy levels.      Expected Outcomes  Short: Talk to doctor about CBD oil and check blood pressures more frequently.  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  Short: Contiue to work on Producer, television/film/video.  Long: Continue to monitor blood pressures and blood sugars regularly.   Short: continue to gain weight in a healthy way. Long:  continue to take medications regularly and check blood pressures.   Short - finish HT program Long - maintain healthy habits on his own       Core Components/Risk Factors/Patient Goals at Discharge (Final Review):  Goals and Risk Factor Review - 08/12/18 1032      Core Components/Risk Factors/Patient Goals Review   Personal Goals Review  Weight Management/Obesity;Lipids;Hypertension;Diabetes    Review  Authur had blood tests last week - cholesterol and glucose were all in normal to low range.  He is taking all meds as directed. His BP and BG have been good at home.  He does get cramps in his legs sometimes - esp after riding his motorcycle.  He can put lidocaine on them  and that relieves cramps.  He feels like he has good energy levels.      Expected Outcomes  Short - finish HT program Long - maintain healthy habits on his own       ITP Comments: ITP Comments    Row Name 05/01/18 1155 05/07/18 0608 05/27/18 1458 06/04/18 0608 07/02/18 0555   ITP Comments  Medical review complete, ITP sent to Dr Loleta Chance for review,changes as needed and signature.  Documentation of diagnosis can be found in [facility] [facility] 02/07/2018 admission  30 day review. Continue with ITP unless directed changes per Medical Director  New to program  [place] has been out on vacation for the last two weeks.   30 day review. Continue with ITP unless directed changes per Medical Director review  30 day review. Continue with ITP unless directed changes per Medical Director review.  1session in July   Row Name 07/10/18 1121 07/16/18 1500 07/30/18 0850 08/27/18 0608     ITP Comments  Pt. has been on vacation since 07/01/18.  Sourish has been out since 7/16.  Called to check up on him. He is preparing to move and hopes to return on Tuesday next week.   30 day review completed. ITP sent to Dr. Ramonita Lab, covering for Dr. Emily Filbert, Medical Director of Cardiac Rehab. Continue with ITP unless changes are made by physician  30 day review completed. ITP  sent to Dr. Emily Filbert, Medical Director of Cardiac Rehab. Continue with ITP unless changes are made by physician       Comments:

## 2018-09-02 ENCOUNTER — Encounter: Payer: Medicare Other | Admitting: *Deleted

## 2018-09-02 DIAGNOSIS — Z951 Presence of aortocoronary bypass graft: Secondary | ICD-10-CM | POA: Diagnosis not present

## 2018-09-02 DIAGNOSIS — Z952 Presence of prosthetic heart valve: Secondary | ICD-10-CM

## 2018-09-02 DIAGNOSIS — Z48812 Encounter for surgical aftercare following surgery on the circulatory system: Secondary | ICD-10-CM | POA: Diagnosis not present

## 2018-09-02 NOTE — Progress Notes (Signed)
Daily Session Note  Patient Details  Name: Harold Parker MRN: [id number] Date of Birth: 03/20/1946 Referring Provider:     Cardiac Rehab from 05/01/2018 in [facility]  Referring Provider  Arida      Encounter Date: 09/02/2018  Check In: Session Check In - 09/02/18 1033      Check-In   Supervising physician immediately available to respond to emergencies  See telemetry face sheet for immediately available ER MD    Location  [facility]    Staff Present  Harold Hawkins, MA, RCEP, CCRP, Exercise Physiologist;Harold Parker RCP,RRT,BSRT;Harold Parker, BA, ACSM CEP, Exercise Physiologist;Harold Spencer, RN BSN    Medication changes reported      No    Fall or balance concerns reported     No    Warm-up and Cool-down  Performed on first and last piece of equipment    Resistance Training Performed  Yes    VAD Patient?  No    PAD/SET Patient?  No      Pain Assessment   Currently in Pain?  No/denies          Social History   Tobacco Use  Smoking Status Former Smoker  . Packs/day: 1.00  . Years: 15.00  . Pack years: 15.00  . Types: Cigarettes  . Last attempt to quit: 02/02/2018  . Years since quitting: 0.5  Smokeless Tobacco Never Used  Tobacco Comment   Quit 01/2018 - on 05/01/2018 talked to Harold Parker about Relaspe concerns. He ststaes that he has no taste for tobacco since he quit in Feb.    Goals Met:  Independence with exercise equipment Exercise tolerated well Personal goals reviewed No report of cardiac concerns or symptoms Strength training completed today  Goals Unmet:  Not Applicable  Comments:  Harold Parker graduated today from  rehab with 36 sessions completed.  Details of the patient's exercise prescription and what He needs to do in order to continue the prescription and progress were discussed with patient.  Patient was given a copy of prescription and goals.  Patient verbalized understanding.  Harold Parker plans to continue to exercise  by walking some on his own.    Dr. Mark Parker is Medical Director for [facility] and [facility]. 

## 2018-09-02 NOTE — Progress Notes (Signed)
Discharge Progress Report  Patient Details  Name: Harold Parker MRN: [id number] Date of Birth: [birth year] Referring Provider:     Cardiac Rehab from 05/01/2018 in [facility]  Referring Provider  Arida       Number of Visits: 36/36  Reason for Discharge:  Patient reached a stable level of exercise. Patient independent in their exercise. Patient has met program and personal goals.  Smoking History:  Social History   Tobacco Use  Smoking Status Former Smoker  . Packs/day: 1.00  . Years: 15.00  . Pack years: 15.00  . Types: Cigarettes  . Last attempt to quit: 02/02/2018  . Years since quitting: 0.5  Smokeless Tobacco Never Used  Tobacco Comment   Quit 01/2018 - on 05/01/2018 talked to Jaspal about Relaspe concerns. He ststaes that he has no taste for tobacco since he quit in Feb.    Diagnosis:  S/P CABG x 3  S/P aortic valve replacement  ADL UCSD:   Initial Exercise Prescription: Initial Exercise Prescription - 05/01/18 1400      Date of Initial Exercise RX and Referring Provider   Date  05/01/18    Referring Provider  Arida      Treadmill   MPH  1.5    Grade  0    Minutes  15    METs  2.15      Recumbant Bike   Level  1    RPM  60    Watts  16    Minutes  15    METs  2      NuStep   Level  2    SPM  80    Minutes  15    METs  2      Prescription Details   Frequency (times per week)  2    Duration  Progress to 30 minutes of continuous aerobic without signs/symptoms of physical distress      Intensity   THRR 40-80% of Max Heartrate  105-133    Ratings of Perceived Exertion  11-13    Perceived Dyspnea  0-4      Resistance Training   Training Prescription  Yes    Weight  3 lb    Reps  10-15       Discharge Exercise Prescription (Final Exercise Prescription Changes): Exercise Prescription Changes - 08/19/18 1600      Response to Exercise   Blood Pressure (Admit)  146/64    Blood Pressure (Exercise)  128/60    Blood  Pressure (Exit)  134/64    Heart Rate (Admit)  71 bpm    Heart Rate (Exercise)  72 bpm    Heart Rate (Exit)  73 bpm    Rating of Perceived Exertion (Exercise)  12    Symptoms  none    Duration  Continue with 30 min of aerobic exercise without signs/symptoms of physical distress.    Intensity  THRR unchanged      Progression   Progression  Continue to progress workloads to maintain intensity without signs/symptoms of physical distress.    Average METs  2.3      Resistance Training   Training Prescription  Yes    Weight  5 lbs    Reps  10-15      Interval Training   Interval Training  No      NuStep   Level  7    Minutes  15    METs  2.5  Arm Ergometer   Level  1    Minutes  15    METs  2.1      Home Exercise Plan   Plans to continue exercise at  Home (comment)    Frequency  Add 2 additional days to program exercise sessions.    Initial Home Exercises Provided  06/03/18       Functional Capacity: 6 Minute Walk    Row Name 05/01/18 1406 08/05/18 1048       6 Minute Walk   Phase  -  Discharge    Distance  888 feet  1042 feet    Distance % Change  -  17 %    Distance Feet Change  -  154 ft    Walk Time  6 minutes  6 minutes    # of Rest Breaks  0  0    MPH  1.68  1.97    METS  2.04  2.19    RPE  11  13    Perceived Dyspnea   0  1    VO2 Peak  7.17  7.69    Symptoms  No  Yes (comment)    Comments  -  calf pain 8/10 (normally takes lidocaine     Resting HR  78 bpm  82 bpm    Resting BP  134/64  126/64    Resting Oxygen Saturation   94 %  93 %    Exercise Oxygen Saturation  during 6 min walk  94 %  91 %    Max Ex. HR  110 bpm  93 bpm    Max Ex. BP  126/60  142/76    2 Minute Post BP  116/68  -      Interval HR   1 Minute HR  -  85    2 Minute HR  -  81    3 Minute HR  -  90    4 Minute HR  -  92    6 Minute HR  -  93    Interval Heart Rate?  -  Yes      Interval Oxygen   Interval Oxygen?  -  Yes    Baseline Oxygen Saturation %  -  93 %    1  Minute Oxygen Saturation %  -  93 %    1 Minute Liters of Oxygen  -  0 L    2 Minute Oxygen Saturation %  -  92 %    2 Minute Liters of Oxygen  -  0 L    3 Minute Oxygen Saturation %  -  92 %    3 Minute Liters of Oxygen  -  0 L    4 Minute Oxygen Saturation %  -  92 %    4 Minute Liters of Oxygen  -  0 L    5 Minute Liters of Oxygen  -  0 L    6 Minute Oxygen Saturation %  -  91 %    6 Minute Liters of Oxygen  -  0 L    2 Minute Post Liters of Oxygen  -  0 L       Psychological, QOL, Others - Outcomes: PHQ 2/9: Depression screen [facility] 2/9 08/26/2018 05/01/2018  Decreased Interest 0 0  Down, Depressed, Hopeless 0 0  PHQ - 2 Score 0 0  Altered sleeping 0 0  Tired, decreased energy 1 0  Change in  appetite - 0  Feeling bad or failure about yourself  0 0  Trouble concentrating 0 0  Moving slowly or fidgety/restless 0 0  Suicidal thoughts 0 0  PHQ-9 Score 1 0  Difficult doing work/chores Not difficult at all Not difficult at all    Quality of Life: Quality of Life - 08/26/18 1352      Quality of Life   Select  Quality of Life      Quality of Life Scores   Health/Function Pre  27.83 %    Health/Function Post  30 %    Health/Function % Change  7.8 %    Socioeconomic Pre  27.86 %    Socioeconomic Post  30 %    Socioeconomic % Change   7.68 %    Psych/Spiritual Pre  30 %    Psych/Spiritual Post  30 %    Psych/Spiritual % Change  0 %    Family Pre  30 %    Family Post  30 %    Family % Change  0 %    GLOBAL Pre  28.6 %    GLOBAL Post  30 %    GLOBAL % Change  4.9 %       Personal Goals: Goals established at orientation with interventions provided to work toward goal. Personal Goals and Risk Factors at Admission - 05/01/18 1443      Core Components/Risk Factors/Patient Goals on Admission    Weight Management  Weight Maintenance;Yes    Intervention  Weight Management: Develop a combined nutrition and exercise program designed to reach desired caloric intake, while  maintaining appropriate intake of nutrient and fiber, sodium and fats, and appropriate energy expenditure required for the weight goal.;Weight Management: Provide education and appropriate resources to help participant work on and attain dietary goals.    Admit Weight  163 lb 12.8 oz (74.3 kg)   Was in hospital for over 4 weeks, loos muscle mass and weight. Wants to regain muscle mass.    Goal Weight: Short Term  165 lb (74.8 kg)    Goal Weight: Long Term  170 lb (77.1 kg)    Expected Outcomes  Short Term: Continue to assess and modify interventions until short term weight is achieved;Long Term: Adherence to nutrition and physical activity/exercise program aimed toward attainment of established weight goal;Weight Maintenance: Understanding of the daily nutrition guidelines, which includes 25-35% calories from fat, 7% or less cal from saturated fats, less than '200mg'$  cholesterol, less than 1.5gm of sodium, & 5 or more servings of fruits and vegetables daily    Diabetes  Yes    Intervention  Provide education about signs/symptoms and action to take for hypo/hyperglycemia.;Provide education about proper nutrition, including hydration, and aerobic/resistive exercise prescription along with prescribed medications to achieve blood glucose in normal ranges: Fasting glucose 65-99 mg/dL    Expected Outcomes  Short Term: Participant verbalizes understanding of the signs/symptoms and immediate care of hyper/hypoglycemia, proper foot care and importance of medication, aerobic/resistive exercise and nutrition plan for blood glucose control.;Long Term: Attainment of HbA1C < 7%.    Hypertension  Yes    Intervention  Provide education on lifestyle modifcations including regular physical activity/exercise, weight management, moderate sodium restriction and increased consumption of fresh fruit, vegetables, and low fat dairy, alcohol moderation, and smoking cessation.;Monitor prescription use compliance.    Expected Outcomes   Short Term: Continued assessment and intervention until BP is < 140/62m HG in hypertensive participants. < 130/883mHG in hypertensive participants  with diabetes, heart failure or chronic kidney disease.;Long Term: Maintenance of blood pressure at goal levels.    Lipids  Yes    Intervention  Provide education and support for participant on nutrition & aerobic/resistive exercise along with prescribed medications to achieve LDL '70mg'$ , HDL >'40mg'$ .    Expected Outcomes  Short Term: Participant states understanding of desired cholesterol values and is compliant with medications prescribed. Participant is following exercise prescription and nutrition guidelines.;Long Term: Cholesterol controlled with medications as prescribed, with individualized exercise RX and with personalized nutrition plan. Value goals: LDL < '70mg'$ , HDL > 40 mg.    Personal Goal Other  Yes    Personal Goal  Wants to get back to riding his [place]. Lost muscle mass will in the hospital for over 4 weeks    Intervention  Provide exercise prescription to work on musvcle mass gain, strength and stamina    Expected Outcomes  ST: Destyn will start to build his muscle strength and stamina while following his exercise prescription. TG:GYIRS will be able to ride his [place] again without concern        Personal Goals Discharge: Goals and Risk Factor Review    Row Name 05/29/18 0948 07/01/18 1013 07/24/18 1021 08/12/18 1032       Core Components/Risk Factors/Patient Goals Review   Personal Goals Review  Weight Management/Obesity;Lipids;Hypertension;Diabetes  Weight Management/Obesity;Lipids;Hypertension;Diabetes  Weight Management/Obesity;Lipids;Hypertension;Diabetes  Weight Management/Obesity;Lipids;Hypertension;Diabetes    Review  Mcihael has been trying to gain muscle weight back.  He is up to 166 lbs and feeling stronger.  His blood pressures have been doing well and he checks them on occasion at home.  We talked about increase the frequency he  checks his pressures at home.  Payden's blood sugars have been doing well.  Today he was 147 mg/dl fasting.  He checks it daily in the morning.  He continues to do well on his medications and would like to get back on CBD oil and other supplements.  He has an appointmnent with Dr. Roxy Manns on the 24th and will ask him about those then.    Winferd is doing well on his blood pressure and still checks them routinely at home. He continues to check his blood sugars at home as well.  He still gaining weight, he has about five more to go to get back to where he wants to be.  He is still doing well on his medications and has been able to restart his supplements and CBD oil. He is also sleeping better again.   Kelsen checked his blood pressures and blood sugar regulary while he was away and stated they were good. Since returning he is only about a pound away from where he wants to be but wants the weight to be gained in a healthy manner. Planning to restart his supplements and CBD oil since returning home.   Deric had blood tests last week - cholesterol and glucose were all in normal to low range.  He is taking all meds as directed. His BP and BG have been good at home.  He does get cramps in his legs sometimes - esp after riding his motorcycle.  He can put lidocaine on them  and that relieves cramps.  He feels like he has good energy levels.      Expected Outcomes  Short: Talk to doctor about CBD oil and check blood pressures more frequently.  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  Short: Contiue to work on Producer, television/film/video.  Long: Continue to monitor blood pressures and blood sugars regularly.   Short: continue to gain weight in a healthy way. Long: continue to take medications regularly and check blood pressures.   Short - finish HT program Long - maintain healthy habits on his own       Exercise Goals and Review: Exercise Goals    Row Name 05/01/18 1405             Exercise Goals   Increase Physical Activity   Yes       Intervention  Provide advice, education, support and counseling about physical activity/exercise needs.;Develop an individualized exercise prescription for aerobic and resistive training based on initial evaluation findings, risk stratification, comorbidities and participant's personal goals.       Expected Outcomes  Short Term: Attend rehab on a regular basis to increase amount of physical activity.;Long Term: Add in home exercise to make exercise part of routine and to increase amount of physical activity.;Long Term: Exercising regularly at least 3-5 days a week.       Increase Strength and Stamina  Yes       Intervention  Provide advice, education, support and counseling about physical activity/exercise needs.;Develop an individualized exercise prescription for aerobic and resistive training based on initial evaluation findings, risk stratification, comorbidities and participant's personal goals.       Expected Outcomes  Short Term: Increase workloads from initial exercise prescription for resistance, speed, and METs.;Short Term: Perform resistance training exercises routinely during rehab and add in resistance training at home;Long Term: Improve cardiorespiratory fitness, muscular endurance and strength as measured by increased METs and functional capacity (6MWT)       Able to understand and use rate of perceived exertion (RPE) scale  Yes       Intervention  Provide education and explanation on how to use RPE scale       Expected Outcomes  Short Term: Able to use RPE daily in rehab to express subjective intensity level;Long Term:  Able to use RPE to guide intensity level when exercising independently       Able to understand and use Dyspnea scale  Yes       Intervention  Provide education and explanation on how to use Dyspnea scale       Expected Outcomes  Short Term: Able to use Dyspnea scale daily in rehab to express subjective sense of shortness of breath during exertion;Long Term: Able  to use Dyspnea scale to guide intensity level when exercising independently       Knowledge and understanding of Target Heart Rate Range (THRR)  Yes       Intervention  Provide education and explanation of THRR including how the numbers were predicted and where they are located for reference       Expected Outcomes  Short Term: Able to state/look up THRR;Short Term: Able to use daily as guideline for intensity in rehab;Long Term: Able to use THRR to govern intensity when exercising independently       Able to check pulse independently  Yes       Intervention  Provide education and demonstration on how to check pulse in carotid and radial arteries.;Review the importance of being able to check your own pulse for safety during independent exercise       Expected Outcomes  Short Term: Able to explain why pulse checking is important during independent exercise;Long Term: Able to check pulse independently and accurately       Understanding of  Exercise Prescription  Yes       Intervention  Provide education, explanation, and written materials on patient's individual exercise prescription       Expected Outcomes  Short Term: Able to explain program exercise prescription;Long Term: Able to explain home exercise prescription to exercise independently          Nutrition & Weight - Outcomes: Pre Biometrics - 05/01/18 1404      Pre Biometrics   Height  '5\' 5"'$  (1.651 m)    Weight  163 lb 12.8 oz (74.3 kg)    Waist Circumference  38.5 inches    Hip Circumference  38.5 inches    Waist to Hip Ratio  1 %    BMI (Calculated)  27.26    Single Leg Stand  27.92 seconds      Post Biometrics - 08/05/18 1047       Post  Biometrics   Height  '5\' 5"'$  (1.651 m)    Weight  172 lb (78 kg)    Waist Circumference  40.5 inches    Hip Circumference  40.5 inches    Waist to Hip Ratio  1 %    BMI (Calculated)  28.62    Single Leg Stand  14.62 seconds       Nutrition: Nutrition Therapy & Goals - 06/09/18 0911       Nutrition Therapy   RD appointment deferred  Yes       Nutrition Discharge: Nutrition Assessments - 08/26/18 1352      MEDFICTS Scores   Pre Score  34       Education Questionnaire Score: Knowledge Questionnaire Score - 08/26/18 1351      Knowledge Questionnaire Score   Pre Score  26/28    Post Score  24/26   test reviewed with pt today      Goals reviewed with patient; copy given to patient.

## 2018-09-02 NOTE — Progress Notes (Signed)
Cardiac Individual Treatment Plan  Patient Details  Name: Harold Parker MRN: [id number] Date of Birth: 1946/07/03 Referring Provider:     Cardiac Rehab from 05/01/2018 in [facility] Cardiac and Pulmonary Rehab  Referring Provider  Arida      Initial Encounter Date:    Cardiac Rehab from 05/01/2018 in [facility] Cardiac and Pulmonary Rehab  Date  05/01/18      Visit Diagnosis: S/P CABG x 3  S/P aortic valve replacement  Patient's Home Medications on Admission:  Current Outpatient Medications:  .  acetaminophen (TYLENOL) 325 MG tablet, Take 650 mg by mouth at bedtime., Disp: , Rfl:  .  ALPRAZolam (XANAX) 0.5 MG tablet, Take 0.5 mg by mouth at bedtime as needed for anxiety., Disp: , Rfl:  .  Ascorbic Acid (VITAMIN C) 1000 MG tablet, Take 1,000 mg by mouth daily., Disp: , Rfl:  .  aspirin EC 81 MG tablet, Take 81 mg by mouth daily., Disp: , Rfl:  .  atorvastatin (LIPITOR) 80 MG tablet, TAKE 1 TABLET BY MOUTH DAILY AT 6 PM, Disp: 30 tablet, Rfl: 0 .  esomeprazole (NEXIUM) 40 MG capsule, Take 40 mg by mouth daily at 12 noon., Disp: , Rfl:  .  ezetimibe (ZETIA) 10 MG tablet, Take 10 mg by mouth daily., Disp: , Rfl:  .  folic acid (FOLVITE) 409 MCG tablet, Take 400 mcg by mouth every evening., Disp: , Rfl:  .  furosemide (LASIX) 40 MG tablet, Take 1 tablet (40 mg) by mouth once daily, Disp: 90 tablet, Rfl: 3 .  glipiZIDE (GLUCOTROL) 5 MG tablet, Take 0.5 tablets (2.5 mg total) by mouth daily before breakfast., Disp: 30 tablet, Rfl: 1 .  ipratropium-albuterol (DUONEB) 0.5-2.5 (3) MG/3ML SOLN, Take 3 mLs by nebulization every 6 (six) hours as needed., Disp: 360 mL, Rfl: 1 .  losartan (COZAAR) 100 MG tablet, Take 1 tablet (100 mg total) by mouth daily., Disp: 90 tablet, Rfl: 3 .  metFORMIN (GLUCOPHAGE) 1000 MG tablet, TAKE 1 TABLET BY MOUTH TWICE DAILY WITH A MEAL, Disp: 60 tablet, Rfl: 0 .  metoprolol tartrate (LOPRESSOR) 50 MG tablet, TAKE 1 TABLET BY MOUTH TWICE DAILY, Disp: 60 tablet, Rfl: 0 .   Vitamin D, Cholecalciferol, 1000 units TABS, Take 1 tablet by mouth every morning. , Disp: , Rfl:   Past Medical History: Past Medical History:  Diagnosis Date  . Bilateral carotid bruits    a. 01/2018 U/S: < 50% bilat ICA stenosis.  Marland Kitchen CAD (coronary artery disease)    a. 1998 s/p MI and BMS [facility], [place]); b. 1999 redo PCI/rotablator in setting of what sounds like ISR;  c. Multiple stress tests over the years - last ~ 2017, reportedly nl; d. 01/2018 NSTEMI/Cath: LM 45md, LAD 50p, 40p/m, D1 60ost, OM1 95, RCA 100ost/p w/ L->R collats, EF 45%; e. s/p 3V CABG 02/19/18 (LIMA-LAD, VG-D1, VG-OM)  . Chronic lower back pain   . [place]   . GIB (gastrointestinal bleeding)    a. 02/2018 GIB and anemia w/ Hgb of 4.7 on presentation; b. 03/2018 EGD: 2 nonbleeding duodenal ulcers.  .Marland KitchenHTN (hypertension)   . Hypercholesteremia   . Ischemic cardiomyopathy    a. 01/2018 Echo: EF 40-45%, mid-apicalanteroseptal, ant, apical sev HK, mod apicalinferior HK. Gr2 DD. Mod AS, mild MR, mod dil LA, PASP 563mg.  . Moderate aortic stenosis    a. 01/2018 Echo: Mod AS, mean grad (S) 2070m, Valve area (VTI) 1.06 cm^2, (Vmax) 1.27 cm^2; b. s/p bioprosthetic  AVR 02/19/18.  . Myocardial infarction ([place]) ~ 1998/1999  . 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 02/19/2018 AVR: 25 mm Edwards Inspiris Resilia stented bovine pericardial tissue valve  . 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 Tobacco abuse   . Type II diabetes mellitus (HCC)     Tobacco Use: Social History   Tobacco Use  Smoking Status Former Smoker  . Packs/day: 1.00  . Years: 15.00  . Pack years: 15.00  . Types: Cigarettes  . Last attempt to quit: 02/02/2018  . Years since quitting: 0.5  Smokeless Tobacco Never Used  Tobacco Comment   Quit 01/2018 - on 05/01/2018 talked to Lamone about Relaspe concerns. He ststaes that he has no taste  for tobacco since he quit in Feb.    Labs: Recent Review Flowsheet Data    Labs for ITP Cardiac and Pulmonary Rehab Latest Ref Rng & Units 02/19/2018 02/19/2018 02/20/2018 02/20/2018 03/27/2018   Cholestrol 100 - 199 mg/dL - - - - 102   LDLCALC 0 - 99 mg/dL - - - - 50   HDL >39 mg/dL - - - - 35(L)   Trlycerides 0 - 149 mg/dL - - - - 84   Hemoglobin A1c 4.8 - 5.6 % - - - - -   PHART 7.350 - 7.450 - 7.335(L) 7.361 7.331(L) -   PCO2ART 32.0 - 48.0 mmHg - 40.2 40.1 41.0 -   HCO3 20.0 - 28.0 mmol/L - 21.2 22.5 21.5 -   TCO2 22 - 32 mmol/L _0 -   ACIDBASEDEF 0.0 - 2.0 mmol/L - 4.0(H) 2.0 4.0(H) -   O2SAT % - 97.0 97.0 95.0 -       Exercise Target Goals: Exercise Program Goal: Individual exercise prescription set using results from initial 6 min walk test and THRR while considering  patient's activity barriers and safety.   Exercise Prescription Goal: Initial exercise prescription builds to 30-45 minutes a day of aerobic activity, 2-3 days per week.  Home exercise guidelines will be given to patient during program as part of exercise prescription that the participant will acknowledge.  Activity Barriers & Risk Stratification: Activity Barriers & Cardiac Risk Stratification - 05/01/18 1450      Activity Barriers & Cardiac Risk Stratification   Activity Barriers  Back Problems   HIstory of back injury years ago.  He has lidocaine patches and epidurals as needed      6 Minute Walk: 6 Minute Walk    Row Name 05/01/18 1406 08/05/18 1048       6 Minute Walk   Phase  -  Discharge    Distance  888 feet  1042 feet    Distance % Change  -  17 %    Distance Feet Change  -  154 ft    Walk Time  6 minutes  6 minutes    # of Rest Breaks  0  0    MPH  1.68  1.97    METS  2.04  2.19    RPE  11  13    Perceived Dyspnea   0  1    VO2 Peak  7.17  7.69    Symptoms  No  Yes (comment)    Comments  -  calf pain 8/10 (normally takes lidocaine     Resting HR  78 bpm  82  bpm    Resting BP   134/64  126/64    Resting Oxygen Saturation   94 %  93 %    Exercise Oxygen Saturation  during 6 min walk  94 %  91 %    Max Ex. HR  110 bpm  93 bpm    Max Ex. BP  126/60  142/76    2 Minute Post BP  116/68  -      Interval HR   1 Minute HR  -  85    2 Minute HR  -  81    3 Minute HR  -  90    4 Minute HR  -  92    6 Minute HR  -  93    Interval Heart Rate?  -  Yes      Interval Oxygen   Interval Oxygen?  -  Yes    Baseline Oxygen Saturation %  -  93 %    1 Minute Oxygen Saturation %  -  93 %    1 Minute Liters of Oxygen  -  0 L    2 Minute Oxygen Saturation %  -  92 %    2 Minute Liters of Oxygen  -  0 L    3 Minute Oxygen Saturation %  -  92 %    3 Minute Liters of Oxygen  -  0 L    4 Minute Oxygen Saturation %  -  92 %    4 Minute Liters of Oxygen  -  0 L    5 Minute Liters of Oxygen  -  0 L    6 Minute Oxygen Saturation %  -  91 %    6 Minute Liters of Oxygen  -  0 L    2 Minute Post Liters of Oxygen  -  0 L       Oxygen Initial Assessment:   Oxygen Re-Evaluation:   Oxygen Discharge (Final Oxygen Re-Evaluation):   Initial Exercise Prescription: Initial Exercise Prescription - 05/01/18 1400      Date of Initial Exercise RX and Referring Provider   Date  05/01/18    Referring Provider  Arida      Treadmill   MPH  1.5    Grade  0    Minutes  15    METs  2.15      Recumbant Bike   Level  1    RPM  60    Watts  16    Minutes  15    METs  2      NuStep   Level  2    SPM  80    Minutes  15    METs  2      Prescription Details   Frequency (times per week)  2    Duration  Progress to 30 minutes of continuous aerobic without signs/symptoms of physical distress      Intensity   THRR 40-80% of Max Heartrate  105-133    Ratings of Perceived Exertion  11-13    Perceived Dyspnea  0-4      Resistance Training   Training Prescription  Yes    Weight  3 lb    Reps  10-15       Perform Capillary Blood Glucose checks as needed.  Exercise  Prescription Changes: Exercise Prescription Changes    Row Name 05/01/18 1400 05/13/18 1500 06/03/18 0900 06/12/18 0800 07/10/18 1100  Response to Exercise   Blood Pressure (Admit)  134/64  132/64  132/64  110/64  116/62   Blood Pressure (Exercise)  126/60  128/60  128/60  120/68  144/72   Blood Pressure (Exit)  116/68  128/60  128/60  124/64  152/68   Heart Rate (Admit)  72 bpm  88 bpm  88 bpm  61 bpm  91 bpm   Heart Rate (Exercise)  93 bpm  94 bpm  94 bpm  106 bpm  104 bpm   Heart Rate (Exit)  63 bpm  90 bpm  90 bpm  78 bpm  81 bpm   Oxygen Saturation (Admit)  94 %  -  -  -  -   Rating of Perceived Exertion (Exercise)  _0 Symptoms  -  none  none  none  none   Comments  -  second full day of exercise  second full day of exercise  -  -   Duration  -  Continue with 30 min of aerobic exercise without signs/symptoms of physical distress.  Continue with 30 min of aerobic exercise without signs/symptoms of physical distress.  Continue with 30 min of aerobic exercise without signs/symptoms of physical distress.  Continue with 30 min of aerobic exercise without signs/symptoms of physical distress.   Intensity  -  THRR unchanged  THRR unchanged  THRR unchanged  THRR unchanged     Progression   Progression  -  Continue to progress workloads to maintain intensity without signs/symptoms of physical distress.  Continue to progress workloads to maintain intensity without signs/symptoms of physical distress.  Continue to progress workloads to maintain intensity without signs/symptoms of physical distress.  Continue to progress workloads to maintain intensity without signs/symptoms of physical distress.   Average METs  -  2.4  2.4  2.28  2.5     Resistance Training   Training Prescription  -  Yes  Yes  Yes  Yes   Weight  -  3 lbs  3 lbs  3 lbs  5 lbs   Reps  -  10-15  10-15  10-15  10-15     Interval Training   Interval Training  -  No  No  No  No     Treadmill   MPH  -  -  -   1.5  -   Grade  -  -  -  0  -   Minutes  -  -  -  15  -   METs  -  -  -  2.15  -     NuStep   Level  -  _1 Minutes  -  _2 METs  -  3  3  2.9  2.7     Arm Ergometer   Level  -  _3 Minutes  -  _4 METs  -  1.8  1.8  1.9  2.3     Home Exercise Plan   Plans to continue exercise at  -  -  Home (comment)  Home (comment)  Home (comment)   Frequency  -  -  Add 2 additional days to program exercise sessions.  Add 2 additional days to program exercise sessions.  Add 2 additional days to  program exercise sessions.   Initial Home Exercises Provided  -  -  06/03/18  06/03/18  06/03/18   Row Name 07/24/18 1100 08/07/18 1100 08/19/18 1600         Response to Exercise   Blood Pressure (Admit)  134/56  106/64  146/64     Blood Pressure (Exercise)  -  142/76  128/60     Blood Pressure (Exit)  128/60  113/68  134/64     Heart Rate (Admit)  73 bpm  82 bpm  71 bpm     Heart Rate (Exercise)  80 bpm  154 bpm  72 bpm     Heart Rate (Exit)  75 bpm  59 bpm  73 bpm     Rating of Perceived Exertion (Exercise)  123  12  12     Symptoms  none  none  none     Comments  first day back after 2 weeks   -  -     Duration  Continue with 30 min of aerobic exercise without signs/symptoms of physical distress.  Continue with 30 min of aerobic exercise without signs/symptoms of physical distress.  Continue with 30 min of aerobic exercise without signs/symptoms of physical distress.     Intensity  THRR unchanged  THRR unchanged  THRR unchanged       Progression   Progression  Continue to progress workloads to maintain intensity without signs/symptoms of physical distress.  Continue to progress workloads to maintain intensity without signs/symptoms of physical distress.  Continue to progress workloads to maintain intensity without signs/symptoms of physical distress.     Average METs  2.15  1.8  2.3       Resistance Training   Training Prescription  Yes  Yes  Yes      Weight  5 lbs  5 lbs  5 lbs     Reps  10-15  10-15  10-15       Interval Training   Interval Training  No  -  No       Treadmill   MPH  -  1.5  -     Grade  -  0  -     Minutes  -  15  -     METs  -  2.15  -       NuStep   Level  '2  2  7     '$ Minutes  '15  15  15     '$ METs  2.8  2.8  2.5       Arm Ergometer   Level  '1  1  1     '$ Minutes  '15  15  15     '$ METs  1.5  1.8  2.1       Home Exercise Plan   Plans to continue exercise at  Home (comment)  Home (comment)  Home (comment)     Frequency  Add 2 additional days to program exercise sessions.  Add 2 additional days to program exercise sessions.  Add 2 additional days to program exercise sessions.     Initial Home Exercises Provided  06/03/18  06/03/18  06/03/18        Exercise Comments: Exercise Comments    Row Name 05/08/18 0932 06/03/18 0942 07/10/18 1121 09/02/18 1034     Exercise Comments  First full day of exercise!  Patient was oriented to gym and equipment including functions, settings, policies, and procedures.  Patient's individual exercise  prescription and treatment plan were reviewed.  All starting workloads were established based on the results of the 6 minute walk test done at initial orientation visit.  The plan for exercise progression was also introduced and progression will be customized based on patient's performance and goals.  Reviewed home exercise with pt today.  Pt plans to walk outside and use DB for exercise.  Reviewed THR, pulse, RPE, sign and symptoms, NTG use, and when to call 911 or MD.  Also discussed weather considerations and indoor options.  Pt voiced understanding.  Pt. has been on vacation since 07/01/2018.  Johnatha graduated today from  rehab with 36 sessions completed.  Details of the patient's exercise prescription and what He needs to do in order to continue the prescription and progress were discussed with patient.  Patient was given a copy of prescription and goals.  Patient verbalized understanding.   Ken plans to continue to exercise by walking some on his own.       Exercise Goals and Review: Exercise Goals    Row Name 05/01/18 1405             Exercise Goals   Increase Physical Activity  Yes       Intervention  Provide advice, education, support and counseling about physical activity/exercise needs.;Develop an individualized exercise prescription for aerobic and resistive training based on initial evaluation findings, risk stratification, comorbidities and participant's personal goals.       Expected Outcomes  Short Term: Attend rehab on a regular basis to increase amount of physical activity.;Long Term: Add in home exercise to make exercise part of routine and to increase amount of physical activity.;Long Term: Exercising regularly at least 3-5 days a week.       Increase Strength and Stamina  Yes       Intervention  Provide advice, education, support and counseling about physical activity/exercise needs.;Develop an individualized exercise prescription for aerobic and resistive training based on initial evaluation findings, risk stratification, comorbidities and participant's personal goals.       Expected Outcomes  Short Term: Increase workloads from initial exercise prescription for resistance, speed, and METs.;Short Term: Perform resistance training exercises routinely during rehab and add in resistance training at home;Long Term: Improve cardiorespiratory fitness, muscular endurance and strength as measured by increased METs and functional capacity (6MWT)       Able to understand and use rate of perceived exertion (RPE) scale  Yes       Intervention  Provide education and explanation on how to use RPE scale       Expected Outcomes  Short Term: Able to use RPE daily in rehab to express subjective intensity level;Long Term:  Able to use RPE to guide intensity level when exercising independently       Able to understand and use Dyspnea scale  Yes       Intervention  Provide education  and explanation on how to use Dyspnea scale       Expected Outcomes  Short Term: Able to use Dyspnea scale daily in rehab to express subjective sense of shortness of breath during exertion;Long Term: Able to use Dyspnea scale to guide intensity level when exercising independently       Knowledge and understanding of Target Heart Rate Range (THRR)  Yes       Intervention  Provide education and explanation of THRR including how the numbers were predicted and where they are located for reference       Expected  Outcomes  Short Term: Able to state/look up THRR;Short Term: Able to use daily as guideline for intensity in rehab;Long Term: Able to use THRR to govern intensity when exercising independently       Able to check pulse independently  Yes       Intervention  Provide education and demonstration on how to check pulse in carotid and radial arteries.;Review the importance of being able to check your own pulse for safety during independent exercise       Expected Outcomes  Short Term: Able to explain why pulse checking is important during independent exercise;Long Term: Able to check pulse independently and accurately       Understanding of Exercise Prescription  Yes       Intervention  Provide education, explanation, and written materials on patient's individual exercise prescription       Expected Outcomes  Short Term: Able to explain program exercise prescription;Long Term: Able to explain home exercise prescription to exercise independently          Exercise Goals Re-Evaluation : Exercise Goals Re-Evaluation    Row Name 05/08/18 0932 05/13/18 1511 05/27/18 1458 05/29/18 0946 06/03/18 0942     Exercise Goal Re-Evaluation   Exercise Goals Review  Increase Physical Activity;Able to understand and use rate of perceived exertion (RPE) scale;Understanding of Exercise Prescription;Knowledge and understanding of Target Heart Rate Range (THRR)  Increase Physical Activity;Understanding of Exercise  Prescription;Increase Strength and Stamina  -  Increase Physical Activity;Understanding of Exercise Prescription;Increase Strength and Stamina  Increase Physical Activity;Increase Strength and Stamina;Able to understand and use rate of perceived exertion (RPE) scale;Knowledge and understanding of Target Heart Rate Range (THRR);Able to check pulse independently;Understanding of Exercise Prescription   Comments  Reviewed RPE scale, THR and program prescription with pt today.  Pt voiced understanding and was given a copy of goals to take home.   Ulysse is off to a good start in rehab.  He is already getting 3 METs on the NuStep.  We will start to increase his workloads and continue to monitor his progress.   Out since last review  Kym returned to exercise today after a trip home.  He continues to feel good.  His ultimate goal is to get back on his bike.  He does feel like he is starting to get his strength back and should be able to handle his bike once cleared by surgeon to ride.    He does get is some exercise at home on his off but has not really been tracking it much.  We will review home exercise guidelines soon.   Reviewed home exercise with pt today.  Pt plans to walk outside and use DB for exercise.  Reviewed THR, pulse, RPE, sign and symptoms, NTG use, and when to call 911 or MD.  Also discussed weather considerations and indoor options.  Pt voiced understanding.   Expected Outcomes  Short: Use RPE daily to regulate intensity.  Long: Follow program prescription in THR.  Short: Increase workloads and review home exercise.  Long: Continue to work on Education administrator.   -  Short: Review home exercise guidelines.  Long: Continue to build strength to ride bike.   Short - continue to exercise on days not at [facility] Long - maintain exercise after completing HT   Row Name 06/12/18 0827 07/01/18 1011 07/24/18 1010 08/07/18 1122 08/19/18 1648     Exercise Goal Re-Evaluation   Exercise Goals Review   Increase Physical Activity;Understanding of Exercise  Prescription;Increase Strength and Stamina  Increase Physical Activity;Increase Strength and Stamina;Understanding of Exercise Prescription  Increase Physical Activity;Increase Strength and Stamina;Understanding of Exercise Prescription  Increase Physical Activity;Increase Strength and Stamina;Understanding of Exercise Prescription  Increase Physical Activity;Increase Strength and Stamina;Understanding of Exercise Prescription   Comments  Lottie has been doing well in rehab.  He continues do what he wants and is not motivated to increase workloads.  He will be switching class to our new 900 class next week. He was able to finally get back on his bike again!!  We will continue to monitor his progression.  Jonovan has been doing well in rehab.  He has still been able to ride his bike some, however, with the heat this week he has not been able to get out.  He has been walking at home and walking in pool too.  He is feeling stronger and has more stamina.   Haim has been on vacaiton in [place] for 2 weeks to pack up his house. He stated that he walked everyday while he was there. He is glad to be back and get back in the routine of exercising.   Adryan has been doing well since returning to rehab. He uses the arm crank and nu step primarily. He continues to walk at home, and has been riding his motorcycle since the weather has been warm.  Amitai continues to do well in rehab.  He is nearing graduation and improved his post 6MWT by 17%!  We will continue to monitor his progress.    Expected Outcomes  Short: Continue to exercise at home and try to increase workloads. Long: Continue to exercise independently  Short: Continue to walk at home.  Long: Continue to regain strength and stamina.   Short: return to rehab regularly. Long: increase workloads with incresaed strength.   Short: continue to attend rehab regularly. Long: increase to 3 additional days of exercise at  home   Short: Graduate!!  Long: Continue to exercise on his own after graduation.       Discharge Exercise Prescription (Final Exercise Prescription Changes): Exercise Prescription Changes - 08/19/18 1600      Response to Exercise   Blood Pressure (Admit)  146/64    Blood Pressure (Exercise)  128/60    Blood Pressure (Exit)  134/64    Heart Rate (Admit)  71 bpm    Heart Rate (Exercise)  72 bpm    Heart Rate (Exit)  73 bpm    Rating of Perceived Exertion (Exercise)  12    Symptoms  none    Duration  Continue with 30 min of aerobic exercise without signs/symptoms of physical distress.    Intensity  THRR unchanged      Progression   Progression  Continue to progress workloads to maintain intensity without signs/symptoms of physical distress.    Average METs  2.3      Resistance Training   Training Prescription  Yes    Weight  5 lbs    Reps  10-15      Interval Training   Interval Training  No      NuStep   Level  7    Minutes  15    METs  2.5      Arm Ergometer   Level  1    Minutes  15    METs  2.1      Home Exercise Plan   Plans to continue exercise at  Home (comment)    Frequency  Add 2 additional days to program exercise sessions.    Initial Home Exercises Provided  06/03/18       Nutrition:  Target Goals: Understanding of nutrition guidelines, daily intake of sodium '1500mg'$ , cholesterol '200mg'$ , calories 30% from fat and 7% or less from saturated fats, daily to have 5 or more servings of fruits and vegetables.  Biometrics: Pre Biometrics - 05/01/18 1404      Pre Biometrics   Height  '5\' 5"'$  (1.651 m)    Weight  163 lb 12.8 oz (74.3 kg)    Waist Circumference  38.5 inches    Hip Circumference  38.5 inches    Waist to Hip Ratio  1 %    BMI (Calculated)  27.26    Single Leg Stand  27.92 seconds      Post Biometrics - 08/05/18 1047       Post  Biometrics   Height  '5\' 5"'$  (1.651 m)    Weight  172 lb (78 kg)    Waist Circumference  40.5 inches    Hip  Circumference  40.5 inches    Waist to Hip Ratio  1 %    BMI (Calculated)  28.62    Single Leg Stand  14.62 seconds       Nutrition Therapy Plan and Nutrition Goals: Nutrition Therapy & Goals - 06/09/18 0911      Nutrition Therapy   RD appointment deferred  Yes       Nutrition Assessments: Nutrition Assessments - 08/26/18 1352      MEDFICTS Scores   Pre Score  34       Nutrition Goals Re-Evaluation: Nutrition Goals Re-Evaluation    [place] Name 05/29/18 0951 07/01/18 1017 07/24/18 1017         Goals   Current Weight  166 lb (75.3 kg)  -  171 lb (77.6 kg)     Nutrition Goal  Meet with dietician  Continue to eat healthy  Eat a heart healthy diet, monitor sodium intake.         Comment  Siddiq would like to work on gaining muscle weight and staying lean.  He has a pretty good diet.  We made him an appointment to meet with dietician after class next Thursday (6/20).    Hagop declined meeting with Lattie Haw during class to set goals.  He continues to focus on healthy eating and getting fruits and vegetables.  He has tried the veggie pastas and really likes them. He is using the air fryer more and more.    His weight is up just a little bit after his trip to [place]. He stated that since he's back he is going to refocus on eating healthy, friuits, vegetables. Still using his air fryer.      Expected Outcome  Short: Meet with dietician.  Long: Continue to follow recommendations to gain muscle weight  Short: Continue to try new recipes.  Long: Continue to work on gaining weight.   Short: refocus on his heart healthy diet. Long: gain a few more pounds and maintain a healthy 172.         Nutrition Goals Discharge (Final Nutrition Goals Re-Evaluation): Nutrition Goals Re-Evaluation - 07/24/18 1017      Goals   Current Weight  171 lb (77.6 kg)    Nutrition Goal  Eat a heart healthy diet, monitor sodium intake.        Comment  His weight is up just a little bit after his trip  to [place]. He  stated that since he's back he is going to refocus on eating healthy, friuits, vegetables. Still using his air fryer.     Expected Outcome  Short: refocus on his heart healthy diet. Long: gain a few more pounds and maintain a healthy 172.        Psychosocial: Target Goals: Acknowledge presence or absence of significant depression and/or stress, maximize coping skills, provide positive support system. Participant is able to verbalize types and ability to use techniques and skills needed for reducing stress and depression.   Initial Review & Psychosocial Screening: Initial Psych Review & Screening - 05/01/18 1448      Initial Review   Current issues with  None Identified      Family Dynamics   Good Support System?  --   Significant other  Family and Friends     Barriers   Psychosocial barriers to participate in program  The patient should benefit from training in stress management and relaxation.;There are no identifiable barriers or psychosocial needs.      Screening Interventions   Interventions  Encouraged to exercise;To provide support and resources with identified psychosocial needs;Provide feedback about the scores to participant    Expected Outcomes  Short Term goal: Utilizing psychosocial counselor, staff and physician to assist with identification of specific Stressors or current issues interfering with healing process. Setting desired goal for each stressor or current issue identified.;Long Term Goal: Stressors or current issues are controlled or eliminated.;Short Term goal: Identification and review with participant of any Quality of Life or Depression concerns found by scoring the questionnaire.;Long Term goal: The participant improves quality of Life and PHQ9 Scores as seen by post scores and/or verbalization of changes       Quality of Life Scores:  Quality of Life - 08/26/18 1352      Quality of Life   Select  Quality of Life      Quality of Life Scores    Health/Function Pre  27.83 %    Health/Function Post  30 %    Health/Function % Change  7.8 %    Socioeconomic Pre  27.86 %    Socioeconomic Post  30 %    Socioeconomic % Change   7.68 %    Psych/Spiritual Pre  30 %    Psych/Spiritual Post  30 %    Psych/Spiritual % Change  0 %    Family Pre  30 %    Family Post  30 %    Family % Change  0 %    GLOBAL Pre  28.6 %    GLOBAL Post  30 %    GLOBAL % Change  4.9 %      Scores of 19 and below usually indicate a poorer quality of life in these areas.  A difference of  2-3 points is a clinically meaningful difference.  A difference of 2-3 points in the total score of the Quality of Life Index has been associated with significant improvement in overall quality of life, self-image, physical symptoms, and general health in studies assessing change in quality of life.  PHQ-9: Recent Review Flowsheet Data    Depression screen [facility] 2/9 08/26/2018 05/01/2018   Decreased Interest 0 0   Down, Depressed, Hopeless 0 0   PHQ - 2 Score 0 0   Altered sleeping 0 0   Tired, decreased energy 1 0   Change in appetite - 0   Feeling bad or failure about yourself  0 0   Trouble concentrating 0 0   Moving slowly or fidgety/restless 0 0   Suicidal thoughts 0 0   PHQ-9 Score 1 0   Difficult doing work/chores Not difficult at all Not difficult at all     Interpretation of Total Score  Total Score Depression Severity:  1-4 = Minimal depression, 5-9 = Mild depression, 10-14 = Moderate depression, 15-19 = Moderately severe depression, 20-27 = Severe depression   Psychosocial Evaluation and Intervention: Psychosocial Evaluation - 09/02/18 1029      Discharge Psychosocial Assessment & Intervention   Comments  Counselor follow up with Deidre Ala for a psychosocial discharge summary.  He reports having made great progress while in this program and has been back on his motorcycle - which was one of his primary goals.  He has been able to travel and feels stronger and  walking better as well.  He continues to sleeep well; has a good appetite and has positive relationships with his family and friends.  He plans to continue a routine of exercise and visit the [place] and [place] in the near future.  Counselor commended Jonluke on his progress made while in this program.         Psychosocial Re-Evaluation: Psychosocial Re-Evaluation    Row Name 05/29/18 [id number] 07/01/18 1015 07/24/18 1013 08/12/18 1036       Psychosocial Re-Evaluation   Current issues with  None Identified  None Identified  Current Anxiety/Panic;Current Stress Concerns  Current Stress Concerns    Comments  Ronon has been doing well in rehab.  He has minimal stress in his life.  He really wants to get back on his bike.  He has an appointment with Dr. Roxy Manns on 6/24 to talk about this.  He feels that he has regained enough strength to ride again. After his trip, he also now has his own car back which makes him happier to have the freedom back.  He sleeps well and gets about 6 hours of sleep a night.  Kyle has been able to return to riding his bike (except this week with weather).  He is also restarted his supplements and using his CBD oil again.  Thus he is sleeping better again.  He conintues to maintain status quo and a postive attitude.  The heat of summer prevents him from doing what he wants to do outside.   Trestin stated it was stressful for him to leave [place]. It caused him some anxiety leaving his children and old house behind. It caused him some excess SOB but he has began to cope since being back.   Jah states his stress and anxiety are gone since they have completed the process to move to [facility].   He is able to ride his motorcycle which helps him manage stress.    Expected Outcomes  Short: Talk to doctor about getting back on bike.  Long: Continue to stay postive.   Short: Continue to get out to ride early in day.  Long: Continue to stay positive.    Short: remain postive, stay active to keep his  mind off of things. Long: Keep in touch with his family, positively cope with being so far away.   Short - continue to exercise and ride motorcycle to control stress Long - manage stress on his own    Interventions  -  Encouraged to attend Cardiac Rehabilitation for the exercise  -  -    Continue Psychosocial Services   -  Follow  up required by staff  Follow up required by staff  -       Psychosocial Discharge (Final Psychosocial Re-Evaluation): Psychosocial Re-Evaluation - 08/12/18 1036      Psychosocial Re-Evaluation   Current issues with  Current Stress Concerns    Comments  Nikan states his stress and anxiety are gone since they have completed the process to move to [place].   He is able to ride his motorcycle which helps him manage stress.    Expected Outcomes  Short - continue to exercise and ride motorcycle to control stress Long - manage stress on his own       Vocational Rehabilitation: Provide vocational rehab assistance to qualifying candidates.   Vocational Rehab Evaluation & Intervention: Vocational Rehab - 05/01/18 1450      Initial Vocational Rehab Evaluation & Intervention   Assessment shows need for Vocational Rehabilitation  No       Education: Education Goals: Education classes will be provided on a variety of topics geared toward better understanding of heart health and risk factor modification. Participant will state understanding/return demonstration of topics presented as noted by education test scores.  Learning Barriers/Preferences: Learning Barriers/Preferences - 05/01/18 1449      Learning Barriers/Preferences   Learning Barriers  None    Learning Preferences  None       Education Topics:  AED/CPR: - Group verbal and written instruction with the use of models to demonstrate the basic use of the AED with the basic ABC's of resuscitation.   Cardiac Rehab from 09/02/2018 in [facility] Cardiac and Pulmonary Rehab  Date  09/02/18  Educator  KS  Instruction  Review Code  1- Verbalizes Understanding      General Nutrition Guidelines/Fats and Fiber: -Group instruction provided by verbal, written material, models and posters to present the general guidelines for heart healthy nutrition. Gives an explanation and review of dietary fats and fiber.   Cardiac Rehab from 09/02/2018 in [facility] Cardiac and Pulmonary Rehab  Date  08/26/18  Educator  LB  Instruction Review Code  1- Verbalizes Understanding      Controlling Sodium/Reading Food Labels: -Group verbal and written material supporting the discussion of sodium use in heart healthy nutrition. Review and explanation with models, verbal and written materials for utilization of the food label.   Cardiac Rehab from 09/02/2018 in [facility] Cardiac and Pulmonary Rehab  Date  05/13/18  Educator  PI  Instruction Review Code  1- Verbalizes Understanding      Exercise Physiology & General Exercise Guidelines: - Group verbal and written instruction with models to review the exercise physiology of the cardiovascular system and associated critical values. Provides general exercise guidelines with specific guidelines to those with heart or lung disease.    Cardiac Rehab from 09/02/2018 in [facility] Cardiac and Pulmonary Rehab  Date  05/20/18  Educator  AS  Instruction Review Code  4- No Evidence of Learning [charting in error]      Aerobic Exercise & Resistance Training: - Gives group verbal and written instruction on the various components of exercise. Focuses on aerobic and resistive training programs and the benefits of this training and how to safely progress through these programs..   Cardiac Rehab from 09/02/2018 in [facility] Cardiac and Pulmonary Rehab  Date  07/24/18  Educator  AS  Instruction Review Code  1- Verbalizes Understanding      Flexibility, Balance, Mind/Body Relaxation: Provides group verbal/written instruction on the benefits of flexibility and balance training, including mind/body exercise modes  such as yoga, pilates and tai chi.  Demonstration and skill practice provided.   Cardiac Rehab from 09/02/2018 in [facility] Cardiac and Pulmonary Rehab  Date  07/29/18  Educator  AS  Instruction Review Code  1- Verbalizes Understanding      Stress and Anxiety: - Provides group verbal and written instruction about the health risks of elevated stress and causes of high stress.  Discuss the correlation between heart/lung disease and anxiety and treatment options. Review healthy ways to manage with stress and anxiety.   Cardiac Rehab from 09/02/2018 in [facility] Cardiac and Pulmonary Rehab  Date  08/05/18  Educator  [facility]  Instruction Review Code  1- Verbalizes Understanding      Depression: - Provides group verbal and written instruction on the correlation between heart/lung disease and depressed mood, treatment options, and the stigmas associated with seeking treatment.   Anatomy & Physiology of the Heart: - Group verbal and written instruction and models provide basic cardiac anatomy and physiology, with the coronary electrical and arterial systems. Review of Valvular disease and Heart Failure   Cardiac Rehab from 09/02/2018 in [facility] Cardiac and Pulmonary Rehab  Date  08/07/18  Educator  CE  Instruction Review Code  1- Verbalizes Understanding      Cardiac Procedures: - Group verbal and written instruction to review commonly prescribed medications for heart disease. Reviews the medication, class of the drug, and side effects. Includes the steps to properly store meds and maintain the prescription regimen. (beta blockers and nitrates)   Cardiac Medications I: - Group verbal and written instruction to review commonly prescribed medications for heart disease. Reviews the medication, class of the drug, and side effects. Includes the steps to properly store meds and maintain the prescription regimen.   Cardiac Rehab from 09/02/2018 in [facility] Cardiac and Pulmonary Rehab  Date  08/12/18  Educator  SB   Instruction Review Code  1- Verbalizes Understanding      Cardiac Medications II: -Group verbal and written instruction to review commonly prescribed medications for heart disease. Reviews the medication, class of the drug, and side effects. (all other drug classes)   Cardiac Rehab from 09/02/2018 in [facility] Cardiac and Pulmonary Rehab  Date  07/31/18  Educator  CE  Instruction Review Code  1- Verbalizes Understanding       Go Sex-Intimacy & Heart Disease, Get SMART - Goal Setting: - Group verbal and written instruction through game format to discuss heart disease and the return to sexual intimacy. Provides group verbal and written material to discuss and apply goal setting through the application of the S.M.A.R.T. Method.   Other Matters of the Heart: - Provides group verbal, written materials and models to describe Stable Angina and Peripheral Artery. Includes description of the disease process and treatment options available to the cardiac patient.   Cardiac Rehab from 09/02/2018 in [facility] Cardiac and Pulmonary Rehab  Date  08/07/18  Educator  CE  Instruction Review Code  1- Verbalizes Understanding      Exercise & Equipment Safety: - Individual verbal instruction and demonstration of equipment use and safety with use of the equipment.   Cardiac Rehab from 09/02/2018 in [facility] Cardiac and Pulmonary Rehab  Date  05/01/18  Educator  [facility]  Instruction Review Code  1- Verbalizes Understanding      Infection Prevention: - Provides verbal and written material to individual with discussion of infection control including proper hand washing and proper equipment cleaning during exercise session.   Cardiac Rehab from 09/02/2018  in [facility] Cardiac and Pulmonary Rehab  Date  05/01/18  Educator  mc  Instruction Review Code  1- Verbalizes Understanding      Falls Prevention: - Provides verbal and written material to individual with discussion of falls prevention and safety.   Cardiac Rehab from  09/02/2018 in [facility] Cardiac and Pulmonary Rehab  Date  05/01/18  Educator  mc  Instruction Review Code  1- Verbalizes Understanding      Diabetes: - Individual verbal and written instruction to review signs/symptoms of diabetes, desired ranges of glucose level fasting, after meals and with exercise. Acknowledge that pre and post exercise glucose checks will be done for 3 sessions at entry of program.   Cardiac Rehab from 09/02/2018 in [facility] Cardiac and Pulmonary Rehab  Date  05/01/18  Educator  [facility]  Instruction Review Code  1- Verbalizes Understanding      Know Your Numbers and Risk Factors: -Group verbal and written instruction about important numbers in your health.  Discussion of what are risk factors and how they play a role in the disease process.  Review of Cholesterol, Blood Pressure, Diabetes, and BMI and the role they play in your overall health.   Cardiac Rehab from 09/02/2018 in [facility] Cardiac and Pulmonary Rehab  Date  07/31/18  Educator  CE  Instruction Review Code  1- Verbalizes Understanding      Sleep Hygiene: -Provides group verbal and written instruction about how sleep can affect your health.  Define sleep hygiene, discuss sleep cycles and impact of sleep habits. Review good sleep hygiene tips.    Cardiac Rehab from 09/02/2018 in [facility] Cardiac and Pulmonary Rehab  Date  08/19/18  Educator  [facility]  Instruction Review Code  1- Verbalizes Understanding [Arrived late to class]      Other: -Provides group and verbal instruction on various topics (see comments)   Knowledge Questionnaire Score: Knowledge Questionnaire Score - 08/26/18 1351      Knowledge Questionnaire Score   Pre Score  26/28    Post Score  24/26   test reviewed with pt today      Core Components/Risk Factors/Patient Goals at Admission: Personal Goals and Risk Factors at Admission - 05/01/18 1443      Core Components/Risk Factors/Patient Goals on Admission    Weight Management  Weight Maintenance;Yes     Intervention  Weight Management: Develop a combined nutrition and exercise program designed to reach desired caloric intake, while maintaining appropriate intake of nutrient and fiber, sodium and fats, and appropriate energy expenditure required for the weight goal.;Weight Management: Provide education and appropriate resources to help participant work on and attain dietary goals.    Admit Weight  163 lb 12.8 oz (74.3 kg)   Was in hospital for over 4 weeks, loos muscle mass and weight. Wants to regain muscle mass.    Goal Weight: Short Term  165 lb (74.8 kg)    Goal Weight: Long Term  170 lb (77.1 kg)    Expected Outcomes  Short Term: Continue to assess and modify interventions until short term weight is achieved;Long Term: Adherence to nutrition and physical activity/exercise program aimed toward attainment of established weight goal;Weight Maintenance: Understanding of the daily nutrition guidelines, which includes 25-35% calories from fat, 7% or less cal from saturated fats, less than '200mg'$  cholesterol, less than 1.5gm of sodium, & 5 or more servings of fruits and vegetables daily    Diabetes  Yes    Intervention  Provide education about signs/symptoms and action to  take for hypo/hyperglycemia.;Provide education about proper nutrition, including hydration, and aerobic/resistive exercise prescription along with prescribed medications to achieve blood glucose in normal ranges: Fasting glucose 65-99 mg/dL    Expected Outcomes  Short Term: Participant verbalizes understanding of the signs/symptoms and immediate care of hyper/hypoglycemia, proper foot care and importance of medication, aerobic/resistive exercise and nutrition plan for blood glucose control.;Long Term: Attainment of HbA1C < 7%.    Hypertension  Yes    Intervention  Provide education on lifestyle modifcations including regular physical activity/exercise, weight management, moderate sodium restriction and increased consumption of fresh  fruit, vegetables, and low fat dairy, alcohol moderation, and smoking cessation.;Monitor prescription use compliance.    Expected Outcomes  Short Term: Continued assessment and intervention until BP is < 140/81m HG in hypertensive participants. < 130/859mHG in hypertensive participants with diabetes, heart failure or chronic kidney disease.;Long Term: Maintenance of blood pressure at goal levels.    Lipids  Yes    Intervention  Provide education and support for participant on nutrition & aerobic/resistive exercise along with prescribed medications to achieve LDL '70mg'$ , HDL >'40mg'$ .    Expected Outcomes  Short Term: Participant states understanding of desired cholesterol values and is compliant with medications prescribed. Participant is following exercise prescription and nutrition guidelines.;Long Term: Cholesterol controlled with medications as prescribed, with individualized exercise RX and with personalized nutrition plan. Value goals: LDL < '70mg'$ , HDL > 40 mg.    Personal Goal Other  Yes    Personal Goal  Wants to get back to riding his [place] muscle [place] will in the hospital for over 4 weeks    Intervention  Provide exercise prescription to work on musvcle mass gain, strength and stamina    Expected Outcomes  ST: RaLavanteill start to build his muscle strength and stamina while following his exercise prescription. LTOZ:DGUYQill be able to ride his HaMarkus Daftgain without concern       Core Components/Risk Factors/Patient Goals Review:  Goals and Risk Factor Review    Row Name 05/29/18 0948 07/01/18 1013 07/24/18 1021 08/12/18 1032       Core Components/Risk Factors/Patient Goals Review   Personal Goals Review  Weight Management/Obesity;Lipids;Hypertension;Diabetes  Weight Management/Obesity;Lipids;Hypertension;Diabetes  Weight Management/Obesity;Lipids;Hypertension;Diabetes  Weight Management/Obesity;Lipids;Hypertension;Diabetes    Review  RaDjuanas been trying to gain muscle weight back.   He is up to 166 lbs and feeling stronger.  His blood pressures have been doing well and he checks them on occasion at home.  We talked about increase the frequency he checks his pressures at home.  Verl's blood sugars have been doing well.  Today he was 147 mg/dl fasting.  He checks it daily in the morning.  He continues to do well on his medications and would like to get back on CBD oil and other supplements.  He has an appointmnent with Dr. OwRoxy Mannsn the 24th and will ask him about those then.    RaKhourys doing well on his blood pressure and still checks them routinely at home. He continues to check his blood sugars at home as well.  He still gaining weight, he has about five more to go to get back to where he wants to be.  He is still doing well on his medications and has been able to restart his supplements and CBD oil. He is also sleeping better again.   RaBurechhecked his blood pressures and blood sugar regulary while he was away and stated they were good. Since returning he is  only about a pound away from where he wants to be but wants the weight to be gained in a healthy manner. Planning to restart his supplements and CBD oil since returning home.   Koury had blood tests last week - cholesterol and glucose were all in normal to low range.  He is taking all meds as directed. His BP and BG have been good at home.  He does get cramps in his legs sometimes - esp after riding his motorcycle.  He can put lidocaine on them  and that relieves cramps.  He feels like he has good energy levels.      Expected Outcomes  Short: Talk to doctor about CBD oil and check blood pressures more frequently.  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  Short: Contiue to work on Producer, television/film/video.  Long: Continue to monitor blood pressures and blood sugars regularly.   Short: continue to gain weight in a healthy way. Long: continue to take medications regularly and check blood pressures.   Short - finish HT program Long - maintain  healthy habits on his own       Core Components/Risk Factors/Patient Goals at Discharge (Final Review):  Goals and Risk Factor Review - 08/12/18 1032      Core Components/Risk Factors/Patient Goals Review   Personal Goals Review  Weight Management/Obesity;Lipids;Hypertension;Diabetes    Review  Kayton had blood tests last week - cholesterol and glucose were all in normal to low range.  He is taking all meds as directed. His BP and BG have been good at home.  He does get cramps in his legs sometimes - esp after riding his motorcycle.  He can put lidocaine on them  and that relieves cramps.  He feels like he has good energy levels.      Expected Outcomes  Short - finish HT program Long - maintain healthy habits on his own       ITP Comments: ITP Comments    Row Name 05/01/18 1155 05/07/18 0608 05/27/18 1458 06/04/18 0608 07/02/18 0555   ITP Comments  Medical review complete, ITP sent to Dr Loleta Chance for review,changes as needed and signature.  Documentation of diagnosis can be found in [facility] [facility]  30 day review. Continue with ITP unless directed changes per Medical Director  New to program  [place] has been out on vacation for the last two weeks.   30 day review. Continue with ITP unless directed changes per Medical Director review  30 day review. Continue with ITP unless directed changes per Medical Director review.  1session in July   Row Name 07/10/18 1121 07/16/18 1500 07/30/18 0850 08/27/18 0608 09/02/18 1410   ITP Comments  Pt. has been on vacation since 07/01/18.  Chesney has been out since 7/16.  Called to check up on him. He is preparing to move and hopes to return on Tuesday next week.   30 day review completed. ITP sent to Dr. Ramonita Lab, covering for Dr. Emily Filbert, Medical Director of Cardiac Rehab. Continue with ITP unless changes are made by physician  30 day review completed. ITP sent to Dr. Emily Filbert, Medical Director of Cardiac Rehab. Continue with ITP unless changes are  made by physician  Discharge ITP sent and signed by Dr. Sabra Heck.  Discharge Summary routed to PCP and cardiologist.      Comments: Discharge ITP

## 2018-09-12 DIAGNOSIS — H04123 Dry eye syndrome of bilateral lacrimal glands: Secondary | ICD-10-CM | POA: Diagnosis not present

## 2018-09-16 ENCOUNTER — Other Ambulatory Visit: Payer: Self-pay | Admitting: Geriatric Medicine

## 2018-09-16 DIAGNOSIS — I739 Peripheral vascular disease, unspecified: Secondary | ICD-10-CM

## 2018-09-16 DIAGNOSIS — L989 Disorder of the skin and subcutaneous tissue, unspecified: Secondary | ICD-10-CM | POA: Diagnosis not present

## 2018-09-22 ENCOUNTER — Ambulatory Visit
Admission: RE | Admit: 2018-09-22 | Discharge: 2018-09-22 | Disposition: A | Discharge status: Home / Self Care | Payer: Medicare Other | Source: Ambulatory Visit | Attending: Geriatric Medicine | Admitting: Geriatric Medicine

## 2018-09-22 DIAGNOSIS — I70212 Atherosclerosis of native arteries of extremities with intermittent claudication, left leg: Secondary | ICD-10-CM | POA: Diagnosis not present

## 2018-09-22 DIAGNOSIS — I739 Peripheral vascular disease, unspecified: Secondary | ICD-10-CM

## 2018-09-24 ENCOUNTER — Telehealth (HOSPITAL_COMMUNITY): Payer: Self-pay | Admitting: *Deleted

## 2018-09-25 DIAGNOSIS — H04123 Dry eye syndrome of bilateral lacrimal glands: Secondary | ICD-10-CM | POA: Diagnosis not present

## 2018-09-26 NOTE — Telephone Encounter (Signed)
Spoke with patient to schedule appointment.

## 2018-10-28 ENCOUNTER — Encounter: Payer: Self-pay | Admitting: Cardiovascular Disease

## 2018-10-28 ENCOUNTER — Ambulatory Visit (INDEPENDENT_AMBULATORY_CARE_PROVIDER_SITE_OTHER): Payer: Medicare Other | Admitting: Cardiovascular Disease

## 2018-10-28 VITALS — BP 144/84 | HR 66 | Ht 65.0 in | Wt 170.0 lb

## 2018-10-28 DIAGNOSIS — I739 Peripheral vascular disease, unspecified: Secondary | ICD-10-CM

## 2018-10-28 DIAGNOSIS — I2510 Atherosclerotic heart disease of native coronary artery without angina pectoris: Secondary | ICD-10-CM | POA: Diagnosis not present

## 2018-10-28 DIAGNOSIS — E785 Hyperlipidemia, unspecified: Secondary | ICD-10-CM | POA: Diagnosis not present

## 2018-10-28 DIAGNOSIS — I255 Ischemic cardiomyopathy: Secondary | ICD-10-CM

## 2018-10-28 DIAGNOSIS — I5022 Chronic systolic (congestive) heart failure: Secondary | ICD-10-CM | POA: Diagnosis not present

## 2018-10-28 MED ORDER — FUROSEMIDE 20 MG PO TABS: 20.0000 mg | ORAL_TABLET | Freq: Every day | ORAL | 0 refills | Status: DC

## 2018-10-28 NOTE — Patient Instructions (Addendum)
Medication Instructions:  DECREASE the Furosemide to 20 mg daily  If you need a refill on your cardiac medications before your next appointment, please call your pharmacy.   Lab work: None ordered  Testing/Procedures: Your physician has requested that you have a lower extremity arterial duplex. During this test, ultrasound is used to evaluate arterial blood flow in the legs. Allow one hour for this exam. There are no restrictions or special instructions. This will take place at [address], the [facility] office.   Follow-Up: At [facility], you and your health needs are our priority.  As part of our continuing mission to provide you with exceptional heart care, we have created designated Provider Care Teams.  These Care Teams include your primary Cardiologist (physician) and Advanced Practice Providers (APPs -  Physician Assistants and Nurse Practitioners) who all work together to provide you with the care you need, when you need it. You will need a follow up appointment in 3 months. You may see [name], MD or one of the following Advanced Practice Providers on your designated Care Team:   Nicolasa Ducking, NP Eula Listen, PA-C . Marisue Ivan, PA-C  Please cancel your appointment with Dr. Arbie Cookey.

## 2018-10-28 NOTE — Progress Notes (Signed)
Cardiology Office Note   Date:  10/28/2018   ID:  Harold Parker, DOB 09/20/1946, MRN [id number]  PCP:  Merlene Laughter, MD  Cardiologist:   [name], MD   Chief Complaint  Patient presents with  . other    4 mo follow up.Left lower leg cramps and burning at night.Medications reviewed verbally.       History of Present Illness: Harold Parker is a 72 y.o. male who presents for a follow-up visit regarding coronary artery disease and aortic stenosis status post CABG and bioprosthetic aortic valve in March 2019.  Other medical problems include hypertension, hyperlipidemia, type 2 diabetes, COPD and previous tobacco use.  The patient moved from [place] earlier this year.  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 He presented in February of this year with non-ST elevation myocardial infarction.  Cardiac catheterization revealed severe left main stenosis in addition to significant disease involving OM1 and RCA.  EF was 40 to 45% with moderate aortic stenosis.  The patient underwent CABG and aortic valve replacement in March.  He had postoperative atrial fibrillation that was controlled with amiodarone.  He was discharged home on aspirin and Plavix.  He presented back with an upper GI bleed with a hemoglobin 4.7.  EGD showed duodenal ulcers.  He was taken off dual antiplatelet therapy and ultimately low-dose aspirin was resumed.  He had an echocardiogram done in April which showed an EF of 40 to 45% with normal functioning bioprosthetic aortic valve.  He recently complained of bilateral leg pain worse on the left side and thus was referred for an ABI by his primary care physician.  It was abnormal in the 0.6 range bilaterally.  The patient reports that he used to have calf cramps at night but has not had that recently.  He currently describes moderate discomfort involving both calves but worse on the left side after prolonged walking.  This  does not happen all the time.  He quit smoking in February.  Past Medical History:  Diagnosis Date  . Bilateral carotid bruits    a. 01/2018 U/S: < 50% bilat ICA stenosis.  Marland Kitchen CAD (coronary artery disease)    a. 1998 s/p MI and BMS [facility], [place]); b. 1999 redo PCI/rotablator in setting of what sounds like ISR;  c. Multiple stress tests over the years - last ~ 2017, reportedly nl; d. 01/2018 NSTEMI/Cath: LM 50m/d, LAD 50p, 40p/m, D1 60ost, OM1 95, RCA 100ost/p w/ L->R collats, EF 45%; e. s/p 3V CABG 02/19/18 (LIMA-LAD, VG-D1, VG-OM)  . Chronic lower back pain   . COPD (chronic obstructive pulmonary disease) (HCC)   . GIB (gastrointestinal bleeding)    a. 02/2018 GIB and anemia w/ Hgb of 4.7 on presentation; b. 03/2018 EGD: 2 nonbleeding duodenal ulcers.  Marland Kitchen HTN (hypertension)   . Hypercholesteremia   . Ischemic cardiomyopathy    a. 01/2018 Echo: EF 40-45%, mid-apicalanteroseptal, ant, apical sev HK, mod apicalinferior HK. Gr2 DD. Mod AS, mild MR, mod dil LA, PASP .  . Moderate aortic stenosis    a. 01/2018 Echo: Mod AS, mean grad (S) , Valve area (VTI) 1.06 cm^2, (Vmax) 1.27 cm^2; b. 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  . Myocardial infarction (HCC) ~ 1998/1999  . 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 02/19/2018 AVR: 25 mm Edwards Inspiris Resilia stented bovine pericardial tissue valve  . 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   LIMA to LAD,  SVG to D1, SVG to OM, EVH via right thigh and leg  . Tobacco abuse   . Type II diabetes mellitus (HCC)     Past Surgical History:  Procedure Laterality Date  . AORTIC VALVE REPLACEMENT N/A 02/19/2018   Procedure: AORTIC VALVE REPLACEMENT (AVR);  Surgeon: Purcell Nails, MD;  Location: [facility] OR;  Service: Open Heart Surgery;  Laterality: N/A;  . COLONOSCOPY    . CORONARY ANGIOPLASTY WITH STENT PLACEMENT  ~ 1998/1999  . CORONARY ARTERY BYPASS GRAFT N/A 02/19/2018   Procedure: CORONARY ARTERY BYPASS GRAFTING (CABG) x three , using left  internal mammary artery and right leg greater saphenous vein harvested endoscopically;  Surgeon: Purcell Nails, MD;  Location: [facility] OR;  Service: Open Heart Surgery;  Laterality: N/A;  . ESOPHAGOGASTRODUODENOSCOPY (EGD) WITH PROPOFOL N/A 03/18/2018   Procedure: ESOPHAGOGASTRODUODENOSCOPY (EGD) WITH PROPOFOL;  Surgeon: Midge Minium, MD;  Location: [facility] ENDOSCOPY;  Service: Endoscopy;  Laterality: N/A;  . LEFT HEART CATH AND CORONARY ANGIOGRAPHY N/A 02/06/2018   Procedure: LEFT HEART CATH AND CORONARY ANGIOGRAPHY;  Surgeon: Iran Ouch, MD;  Location: [facility] INVASIVE CV LAB;  Service: Cardiovascular;  Laterality: N/A;  . TEE WITHOUT CARDIOVERSION N/A 02/19/2018   Procedure: TRANSESOPHAGEAL ECHOCARDIOGRAM (TEE);  Surgeon: Purcell Nails, MD;  Location: [facility] OR;  Service: Open Heart Surgery;  Laterality: N/A;  . TONSILLECTOMY       Current Outpatient Medications  Medication Sig Dispense Refill  . acetaminophen (TYLENOL) 325 MG tablet Take 650 mg by mouth at bedtime.    . ALPRAZolam (XANAX) 0.5 MG tablet Take 0.5 mg by mouth at bedtime as needed for anxiety.    . Ascorbic Acid (VITAMIN C) 1000 MG tablet Take 1,000 mg by mouth daily.    Marland Kitchen aspirin EC 81 MG tablet Take 81 mg by mouth daily.    Marland Kitchen atorvastatin (LIPITOR) 80 MG tablet TAKE 1 TABLET BY MOUTH DAILY AT 6 PM 30 tablet 0  . esomeprazole (NEXIUM) 40 MG capsule Take 40 mg by mouth daily at 12 noon.    . ezetimibe (ZETIA) 10 MG tablet Take 10 mg by mouth daily.    . folic acid (FOLVITE) 400 MCG tablet Take 400 mcg by mouth every evening.    . furosemide (LASIX) 20 MG tablet Take 1 tablet (20 mg total) by mouth daily. 90 tablet 0  . glipiZIDE (GLUCOTROL) 5 MG tablet Take 0.5 tablets (2.5 mg total) by mouth daily before breakfast. 30 tablet 1  . ipratropium-albuterol (DUONEB) 0.5-2.5 (3) MG/3ML SOLN Take 3 mLs by nebulization every 6 (six) hours as needed. 360 mL 1  . losartan (COZAAR) 100 MG tablet Take 1 tablet (100 mg total) by mouth daily. 90  tablet 3  . metFORMIN (GLUCOPHAGE) 1000 MG tablet TAKE 1 TABLET BY MOUTH TWICE DAILY WITH A MEAL 60 tablet 0  . metoprolol tartrate (LOPRESSOR) 50 MG tablet TAKE 1 TABLET BY MOUTH TWICE DAILY 60 tablet 0  . Vitamin D, Cholecalciferol, 1000 units TABS Take 1 tablet by mouth every morning.      No current facility-administered medications for this visit.     Allergies:   Patient has no known allergies.    Social History:  The patient  reports that he quit smoking about 8 months ago. His smoking use included cigarettes. He has a 15.00 pack-year smoking history. He has never used smokeless tobacco. He reports that he drinks alcohol. He reports that he does not use drugs.   Family History:  The  patient's family history includes Lymphoma in his mother; Peripheral vascular disease in his father.    ROS:  Please see the history of present illness.   Otherwise, review of systems are positive for none.   All other systems are reviewed and negative.    PHYSICAL EXAM: VS:  BP (!) 144/84 (BP Location: Left Arm, Patient Position: Sitting, Cuff Size: Normal)   Pulse 66   Ht 5\' 5"  (1.651 m)   Wt 170 lb (77.1 kg)   BMI 28.29 kg/m  , BMI Body mass index is 28.29 kg/m. GEN: Well nourished, well developed, in no acute distress  HEENT: normal  Neck: no JVD, carotid bruits, or masses Cardiac: RRR; no murmurs, rubs, or gallops,no edema  Respiratory:  clear to auscultation bilaterally, normal work of breathing GI: soft, nontender, nondistended, + BS MS: no deformity or atrophy  Skin: warm and dry, no rash Neuro:  Strength and sensation are intact Psych: euthymic mood, full affect Vascular: Femoral pulses normal bilaterally.  Distal pulses are not palpable.   EKG:  EKG is ordered today. The ekg ordered today demonstrates normal sinus rhythm with old inferior infarct.   Right bundle branch block.  Recent Labs: 02/18/2018: B Natriuretic Peptide 167.8 02/25/2018: Magnesium 2.1; TSH 1.408 03/27/2018:  ALT 15; BUN 17; Creatinine, Ser 0.79; Hemoglobin 12.1; Platelets 275; Potassium 5.2; Sodium 142    Lipid Panel    Component Value Date/Time   CHOL 102 03/27/2018 0900   TRIG 84 03/27/2018 0900   HDL 35 (L) 03/27/2018 0900   CHOLHDL 2.9 03/27/2018 0900   CHOLHDL 3.3 02/06/2018 0244   VLDL 23 02/06/2018 0244   LDLCALC 50 03/27/2018 0900      Wt Readings from Last 3 Encounters:  10/28/18 170 lb (77.1 kg)  08/05/18 172 lb (78 kg)  06/30/18 168 lb (76.2 kg)       No flowsheet data found.    ASSESSMENT AND PLAN:  1.  Peripheral arterial disease: The patient has mild to moderate bilateral calf claudication worse on the left side.  I discussed with him the natural history and management of peripheral arterial disease.  His symptoms are currently not lifestyle limiting but he has not been exerting his himself much.  I advised him to start a walking program.  I will consider referring him to cardiac rehab. I am going to obtain lower extremity arterial duplex to see if his disease is approachable percutaneously. Continue treatment of risk factors. I do not advise cilostazol given his chronic systolic heart failure.  2. Coronary artery disease involving native coronary arteries without angina: Status post CABG in March 2019.  He  Continue medical therapy.  3.  Status post bioprosthetic aortic valve replacement for aortic stenosis: Most recent echo showed normal functioning bioprosthetic aortic valve.  4.  Chronic systolic heart failure with mildly reduced LV systolic function.  He appears to be euvolemic.  Continue treatment with metoprolol and losartan.  I decrease furosemide to 20 mg daily.  5.  Hyperlipidemia: Currently on high-dose atorvastatin and Zetia.  Most recent lipid profile showed an LDL of 50.    Disposition:   FU with me in 3 months  Signed,  [name], MD  10/28/2018 5:43 PM    [facility]

## 2018-10-29 DIAGNOSIS — H2512 Age-related nuclear cataract, left eye: Secondary | ICD-10-CM | POA: Diagnosis not present

## 2018-10-29 DIAGNOSIS — H25812 Combined forms of age-related cataract, left eye: Secondary | ICD-10-CM | POA: Diagnosis not present

## 2018-11-10 ENCOUNTER — Encounter

## 2018-11-10 ENCOUNTER — Encounter: Payer: Medicare Other | Admitting: Surgery

## 2018-11-11 ENCOUNTER — Other Ambulatory Visit: Payer: Self-pay | Admitting: Cardiovascular Disease

## 2018-11-11 DIAGNOSIS — I739 Peripheral vascular disease, unspecified: Secondary | ICD-10-CM

## 2018-11-12 ENCOUNTER — Ambulatory Visit (INDEPENDENT_AMBULATORY_CARE_PROVIDER_SITE_OTHER): Payer: Medicare Other

## 2018-11-12 DIAGNOSIS — I739 Peripheral vascular disease, unspecified: Secondary | ICD-10-CM | POA: Diagnosis not present

## 2018-11-26 DIAGNOSIS — J449 Chronic obstructive pulmonary disease, unspecified: Secondary | ICD-10-CM | POA: Diagnosis not present

## 2018-11-26 DIAGNOSIS — J438 Other emphysema: Secondary | ICD-10-CM | POA: Diagnosis not present

## 2018-12-02 DIAGNOSIS — J449 Chronic obstructive pulmonary disease, unspecified: Secondary | ICD-10-CM | POA: Diagnosis not present

## 2018-12-02 DIAGNOSIS — I129 Hypertensive chronic kidney disease with stage 1 through stage 4 chronic kidney disease, or unspecified chronic kidney disease: Secondary | ICD-10-CM | POA: Diagnosis not present

## 2018-12-02 DIAGNOSIS — N183 Chronic kidney disease, stage 3 (moderate): Secondary | ICD-10-CM | POA: Diagnosis not present

## 2018-12-02 DIAGNOSIS — J9621 Acute and chronic respiratory failure with hypoxia: Secondary | ICD-10-CM | POA: Diagnosis not present

## 2019-01-05 ENCOUNTER — Telehealth: Payer: Self-pay | Admitting: Cardiovascular Disease

## 2019-01-05 NOTE — Telephone Encounter (Signed)
Patient calling States that he will be going out of town and needed to r/s upcoming appointment to 2/11 with R. Dunn Patient would also like to speak with Dr Jari Sportsman nurse, says he has not been feeling well but no details were given when asked  Please call to discuss

## 2019-01-05 NOTE — Telephone Encounter (Signed)
Call returned to the patient. He stated that he has been having increased shortness of breath and a tired feeling in his legs. His PCP recently started him on 2 liters of oxygen which has helped with the shortness of breath. He stated that the shortness of breath only happens occasionally on exertion but not when he is at rest or lying down. He also gets a tried feeling in his legs, bilaterally, when he exerts himself. He stated that the feeling subsides when he is at rest. He denies leg pain.   He has been given a follow up appointment with Dr. Kirke Corin on 2/4 with the understanding that he will call the office if his symptoms worsen.

## 2019-01-20 ENCOUNTER — Ambulatory Visit (INDEPENDENT_AMBULATORY_CARE_PROVIDER_SITE_OTHER): Payer: Medicare Other | Admitting: Cardiovascular Disease

## 2019-01-20 ENCOUNTER — Encounter: Payer: Self-pay | Admitting: Cardiovascular Disease

## 2019-01-20 VITALS — BP 160/60 | HR 71 | Ht 66.0 in | Wt 176.0 lb

## 2019-01-20 DIAGNOSIS — I5022 Chronic systolic (congestive) heart failure: Secondary | ICD-10-CM

## 2019-01-20 DIAGNOSIS — I2510 Atherosclerotic heart disease of native coronary artery without angina pectoris: Secondary | ICD-10-CM | POA: Diagnosis not present

## 2019-01-20 DIAGNOSIS — I359 Nonrheumatic aortic valve disorder, unspecified: Secondary | ICD-10-CM

## 2019-01-20 DIAGNOSIS — I739 Peripheral vascular disease, unspecified: Secondary | ICD-10-CM

## 2019-01-20 DIAGNOSIS — E785 Hyperlipidemia, unspecified: Secondary | ICD-10-CM | POA: Diagnosis not present

## 2019-01-20 MED ORDER — METOPROLOL SUCCINATE ER 100 MG PO TB24: 100.0000 mg | ORAL_TABLET | Freq: Every day | ORAL | 0 refills | Status: DC

## 2019-01-20 NOTE — Progress Notes (Signed)
Cardiology Office Note   Date:  01/20/2019   ID:  Harold Parker, DOB June 02, 1946, MRN [id number]  PCP:  Merlene Laughter, MD  Cardiologist:   [name], MD  Chief Complaint  Patient presents with  . Other    patient c/o increased shortness of breath/fatigue quickly. Meds reviewed verbally with patient.       History of Present Illness: Harold Parker is a 73 y.o. male who presents for a follow-up visit regarding coronary artery disease and aortic stenosis status post CABG and bioprosthetic aortic valve in March 2019.  Other medical problems include hypertension, hyperlipidemia, type 2 diabetes, COPD and previous tobacco use.   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  He presented in February of 2019 with non-ST elevation myocardial infarction.  Cardiac catheterization revealed severe left main stenosis in addition to significant disease involving OM1 and RCA.  EF was 40 to 45% with moderate aortic stenosis.  The patient underwent CABG and aortic valve replacement in March.  He had postoperative atrial fibrillation that was controlled with amiodarone.  He was discharged home on aspirin and Plavix.  He presented back with an upper GI bleed with a hemoglobin 4.7.  EGD showed duodenal ulcers.  He was taken off dual antiplatelet therapy and ultimately low-dose aspirin was resumed.  He had an echocardiogram done in April, 2019 which showed an EF of 40 to 45% with normal functioning bioprosthetic aortic valve.  The patient has known history of peripheral arterial disease.  ABI done last year was abnormal in the 0.6 range bilaterally.  Duplex showed significant SFA disease worse on the left side.  The patient had worsening shortness of breath in December and was seen by his primary care physician.  He was congested at that time and it was felt that he had COPD exacerbation and was placed on oxygen as needed in addition to  Trelegy inhaler.  He  reports improvement in shortness of breath but is not back to baseline. He also had worsening bilateral leg cramps but his symptoms improved after he was taken off atorvastatin.  Past Medical History:  Diagnosis Date  . Bilateral carotid bruits    a. 01/2018 U/S: < 50% bilat ICA stenosis.  Marland Kitchen CAD (coronary artery disease)    a. 1998 s/p MI and BMS [facility], [place]); b. 1999 redo PCI/rotablator in setting of what sounds like ISR;  c. Multiple stress tests over the years - last ~ 2017, reportedly nl; d. 01/2018 NSTEMI/Cath: LM 46m/d, LAD 50p, 40p/m, D1 60ost, OM1 95, RCA 100ost/p w/ L->R collats, EF 45%; e. s/p 3V CABG 02/19/18 (LIMA-LAD, VG-D1, VG-OM)  . Chronic lower back pain   . COPD (chronic obstructive pulmonary disease) (HCC)   . GIB (gastrointestinal bleeding)    a. 02/2018 GIB and anemia w/ Hgb of 4.7 on presentation; b. 03/2018 EGD: 2 nonbleeding duodenal ulcers.  Marland Kitchen HTN (hypertension)   . Hypercholesteremia   . Ischemic cardiomyopathy    a. 01/2018 Echo: EF 40-45%, mid-apicalanteroseptal, ant, apical sev HK, mod apicalinferior HK. Gr2 DD. Mod AS, mild MR, mod dil LA, PASP .  . Moderate aortic stenosis    a. 01/2018 Echo: Mod AS, mean grad (S) , Valve area (VTI) 1.06 cm^2, (Vmax) 1.27 cm^2; b. 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  . Myocardial infarction (HCC) ~ 1998/1999  . 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 02/19/2018 AVR: 25 mm Edwards Inspiris Resilia stented  bovine pericardial tissue valve  . 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 Tobacco abuse   . Type II diabetes mellitus (HCC)     Past Surgical History:  Procedure Laterality Date  . AORTIC VALVE REPLACEMENT N/A 02/19/2018   Procedure: AORTIC VALVE REPLACEMENT (AVR);  Surgeon: Purcell Nails, MD;  Location: [facility] OR;  Service: Open Heart Surgery;  Laterality: N/A;  . COLONOSCOPY    . CORONARY ANGIOPLASTY WITH STENT PLACEMENT  ~ 1998/1999  .  CORONARY ARTERY BYPASS GRAFT N/A 02/19/2018   Procedure: CORONARY ARTERY BYPASS GRAFTING (CABG) x three , using left internal mammary artery and right leg greater saphenous vein harvested endoscopically;  Surgeon: Purcell Nails, MD;  Location: [facility] OR;  Service: Open Heart Surgery;  Laterality: N/A;  . ESOPHAGOGASTRODUODENOSCOPY (EGD) WITH PROPOFOL N/A 03/18/2018   Procedure: ESOPHAGOGASTRODUODENOSCOPY (EGD) WITH PROPOFOL;  Surgeon: Midge Minium, MD;  Location: [facility] ENDOSCOPY;  Service: Endoscopy;  Laterality: N/A;  . LEFT HEART CATH AND CORONARY ANGIOGRAPHY N/A 02/06/2018   Procedure: LEFT HEART CATH AND CORONARY ANGIOGRAPHY;  Surgeon: Iran Ouch, MD;  Location: [facility] INVASIVE CV LAB;  Service: Cardiovascular;  Laterality: N/A;  . TEE WITHOUT CARDIOVERSION N/A 02/19/2018   Procedure: TRANSESOPHAGEAL ECHOCARDIOGRAM (TEE);  Surgeon: Purcell Nails, MD;  Location: [facility] OR;  Service: Open Heart Surgery;  Laterality: N/A;  . TONSILLECTOMY       Current Outpatient Medications  Medication Sig Dispense Refill  . acetaminophen (TYLENOL) 325 MG tablet Take 650 mg by mouth at bedtime.    . ALPRAZolam (XANAX) 0.5 MG tablet Take 0.5 mg by mouth at bedtime as needed for anxiety.    . Ascorbic Acid (VITAMIN C) 1000 MG tablet Take 1,000 mg by mouth daily.    Marland Kitchen aspirin EC 81 MG tablet Take 81 mg by mouth daily.    Marland Kitchen esomeprazole (NEXIUM) 40 MG capsule Take 40 mg by mouth daily at 12 noon.    . ezetimibe (ZETIA) 10 MG tablet Take 10 mg by mouth daily.    . folic acid (FOLVITE) 400 MCG tablet Take 400 mcg by mouth every evening.    . furosemide (LASIX) 20 MG tablet Take 1 tablet (20 mg total) by mouth daily. 90 tablet 0  . glipiZIDE (GLUCOTROL) 5 MG tablet Take 0.5 tablets (2.5 mg total) by mouth daily before breakfast. 30 tablet 1  . ipratropium-albuterol (DUONEB) 0.5-2.5 (3) MG/3ML SOLN Take 3 mLs by nebulization every 6 (six) hours as needed. 360 mL 1  . losartan (COZAAR) 100 MG tablet Take 1 tablet (100 mg  total) by mouth daily. 90 tablet 3  . metFORMIN (GLUCOPHAGE) 1000 MG tablet TAKE 1 TABLET BY MOUTH TWICE DAILY WITH A MEAL 60 tablet 0  . metoprolol tartrate (LOPRESSOR) 50 MG tablet TAKE 1 TABLET BY MOUTH TWICE DAILY 60 tablet 0  . Vitamin D, Cholecalciferol, 1000 units TABS Take 1 tablet by mouth every morning.      No current facility-administered medications for this visit.     Allergies:   Patient has no known allergies.    Social History:  The patient  reports that he quit smoking about a year ago. His smoking use included cigarettes. He has a 15.00 pack-year smoking history. He has never used smokeless tobacco. He reports current alcohol use. He reports that he does not use drugs.   Family History:  The patient's family history includes  Lymphoma in his mother; Peripheral vascular disease in his father.    ROS:  Please see the history of present illness.   Otherwise, review of systems are positive for none.   All other systems are reviewed and negative.    PHYSICAL EXAM: VS:  BP (!) 160/60 (BP Location: Left Arm, Patient Position: Sitting, Cuff Size: Normal)   Pulse 71   Ht 5\' 6"  (1.676 m)   Wt 176 lb (79.8 kg)   BMI 28.41 kg/m  , BMI Body mass index is 28.41 kg/m. GEN: Well nourished, well developed, in no acute distress  HEENT: normal  Neck: no JVD, carotid bruits, or masses Cardiac: RRR; no  rubs, or gallops,no edema .  1 out of 6 systolic murmur in the aortic area Respiratory:  clear to auscultation bilaterally, normal work of breathing GI: soft, nontender, nondistended, + BS MS: no deformity or atrophy  Skin: warm and dry, no rash Neuro:  Strength and sensation are intact Psych: euthymic mood, full affect Vascular: Femoral pulses normal bilaterally.  Distal pulses are not palpable.   EKG:  EKG is ordered today. The ekg ordered today demonstrates normal sinus rhythm with old inferior infarct.   Right bundle branch block.  Recent Labs: 02/18/2018: B Natriuretic  Peptide 167.8 02/25/2018: Magnesium 2.1; TSH 1.408 03/27/2018: ALT 15; BUN 17; Creatinine, Ser 0.79; Hemoglobin 12.1; Platelets 275; Potassium 5.2; Sodium 142    Lipid Panel    Component Value Date/Time   CHOL 102 03/27/2018 0900   TRIG 84 03/27/2018 0900   HDL 35 (L) 03/27/2018 0900   CHOLHDL 2.9 03/27/2018 0900   CHOLHDL 3.3 02/06/2018 0244   VLDL 23 02/06/2018 0244   LDLCALC 50 03/27/2018 0900      Wt Readings from Last 3 Encounters:  01/20/19 176 lb (79.8 kg)  10/28/18 170 lb (77.1 kg)  08/05/18 172 lb (78 kg)       No flowsheet data found.    ASSESSMENT AND PLAN:  1.  Peripheral arterial disease: The patient reports improvement in leg cramps after he was taken off atorvastatin.  Nonetheless, it appears that he continues to have significant claudication.   I again discussed with him the option of proceeding with an angiography and possible endovascular intervention.  I do think this might improve his functional capacity overall.  He wants to wait and reevaluate his symptoms off statins.  2. Coronary artery disease involving native coronary arteries with worsening exertional dyspnea: Status post CABG in March 2019.  Going to obtain an echocardiogram.  3.  Status post bioprosthetic aortic valve replacement for aortic stenosis: Most recent echo showed normal functioning bioprosthetic aortic valve.  However, given worsening exertional dyspnea and fatigue, I am going to repeat his echocardiogram to evaluate aortic valve and also ejection fraction.  4.  Chronic systolic heart failure with mildly reduced LV systolic function.  He appears to be euvolemic.  Continue treatment with metoprolol and losartan.  I elected to switch metoprolol to Toprol 100 mg once daily given his heart failure. If EF is below 40%, I recommend switching losartan to Entresto and considering spironolactone.  It is highly possible that his worsening shortness of breath is related to COPD and not heart  failure.  5.  Hyperlipidemia: Currently on  Zetia.  Atorvastatin was discontinued due to leg cramps.  We should consider introducing a small dose rosuvastatin.    Disposition:   FU with me in 3 months  Signed,  [name], MD 01/20/2019 4:56 PM  [facility]

## 2019-01-20 NOTE — Patient Instructions (Signed)
Medication Instructions:  STOP the Metoprolol Tartrate START Metoprolol Succinate 100 mg daily  If you need a refill on your cardiac medications before your next appointment, please call your pharmacy.   Lab work: None ordered  Testing/Procedures: Your physician has requested that you have an echocardiogram. Echocardiography is a painless test that uses sound waves to create images of your heart. It provides your doctor with information about the size and shape of your heart and how well your heart's chambers and valves are working. You may receive an ultrasound enhancing agent through an IV if needed to better visualize your heart during the echo.This procedure takes approximately one hour. There are no restrictions for this procedure. This will take place at the [facility] clinic.    Follow-Up: At [facility], you and your health needs are our priority.  As part of our continuing mission to provide you with exceptional heart care, we have created designated Provider Care Teams.  These Care Teams include your primary Cardiologist (physician) and Advanced Practice Providers (APPs -  Physician Assistants and Nurse Practitioners) who all work together to provide you with the care you need, when you need it. You will need a follow up appointment in 3 months.  Please call our office 2 months in advance to schedule this appointment.  You may see [name], MD or one of the following Advanced Practice Providers on your designated Care Team:   Nicolasa Ducking, NP Eula Listen, PA-C . Marisue Ivan, PA-C

## 2019-01-21 ENCOUNTER — Telehealth: Payer: Self-pay | Admitting: Cardiovascular Disease

## 2019-01-21 NOTE — Telephone Encounter (Signed)
Does he still need to speak to nurse?

## 2019-01-21 NOTE — Telephone Encounter (Signed)
I'm not sure.  

## 2019-01-21 NOTE — Telephone Encounter (Signed)
Patient calling to schedule asap echo .  Declined next available in [place] and would like to go to [place] if sooner. He states he is confused from this conversation now and wants to fu with the nurse.  Please call , then advise scheduling

## 2019-01-21 NOTE — Telephone Encounter (Signed)
Patient scheduled from cancellation list for tomorrow here in [place]

## 2019-01-22 ENCOUNTER — Ambulatory Visit (INDEPENDENT_AMBULATORY_CARE_PROVIDER_SITE_OTHER): Payer: Medicare Other

## 2019-01-22 DIAGNOSIS — I359 Nonrheumatic aortic valve disorder, unspecified: Secondary | ICD-10-CM | POA: Diagnosis not present

## 2019-01-23 ENCOUNTER — Telehealth: Payer: Self-pay | Admitting: Cardiovascular Disease

## 2019-01-23 DIAGNOSIS — I5022 Chronic systolic (congestive) heart failure: Secondary | ICD-10-CM

## 2019-01-23 NOTE — Telephone Encounter (Signed)
Patient calling to discuss recent testing results  ° °Please call  ° °

## 2019-01-23 NOTE — Telephone Encounter (Signed)
The patient had an echo on 2/6- report is still currently pending being read.

## 2019-01-26 MED ORDER — FUROSEMIDE 40 MG PO TABS: 40.0000 mg | ORAL_TABLET | Freq: Every day | ORAL | Status: DC

## 2019-01-26 NOTE — Telephone Encounter (Signed)
Patient eager for results. He leaves for [place] on Wednesday and he said Dr Kirke Corin wanted him to have this done before he leaves. He wants to make sure he doesn't need to change anything prior to leaving.  Advised I will route to Dr Kirke Corin and his nurse.

## 2019-01-26 NOTE — Telephone Encounter (Signed)
Patient made aware of results and verbalized understanding. Furosemide has been increased to 40 mg daily and the patient will have a BMET drawn next week. He will call when he returns next week.    Notes recorded by Iran Ouch, MD on 01/26/2019 at 2:29 PM EST Inform patient that echo showed stable ejection fraction and normal functioning bioprosthetic aortic valve. However, there is evidence of increased pulmonary pressure likely reflecting worsening heart failure. I recommend increasing furosemide to 40 mg once daily. Check basic metabolic profile in 1 week. I want to switch him from losartan to [place] once he comes back from [place]. He should let us know when he comes back.

## 2019-01-26 NOTE — Telephone Encounter (Signed)
Patient calling to check status of echo result s °

## 2019-01-27 ENCOUNTER — Ambulatory Visit: Payer: Medicare Other | Admitting: Physician Assistant

## 2019-01-30 ENCOUNTER — Ambulatory Visit: Payer: Medicare Other | Admitting: Cardiovascular Disease

## 2019-02-04 ENCOUNTER — Other Ambulatory Visit (INDEPENDENT_AMBULATORY_CARE_PROVIDER_SITE_OTHER): Payer: Medicare Other | Admitting: *Deleted

## 2019-02-04 DIAGNOSIS — I5022 Chronic systolic (congestive) heart failure: Secondary | ICD-10-CM | POA: Diagnosis not present

## 2019-02-05 LAB — BASIC METABOLIC PANEL
BUN/Creatinine Ratio: 27 — ABNORMAL HIGH (ref 10–24)
BUN: 25 mg/dL (ref 8–27)
CO2: 24 mmol/L (ref 20–29)
Calcium: 9.6 mg/dL (ref 8.6–10.2)
Chloride: 104 mmol/L (ref 96–106)
Creatinine, Ser: 0.92 mg/dL (ref 0.76–1.27)
GFR calc Af Amer: 94 mL/min/{1.73_m2} (ref >59)
GFR calc non Af Amer: 82 mL/min/{1.73_m2} (ref >59)
Glucose: 139 mg/dL — ABNORMAL HIGH (ref 65–99)
Potassium: 4.6 mmol/L (ref 3.5–5.2)
Sodium: 143 mmol/L (ref 134–144)

## 2019-02-06 ENCOUNTER — Encounter: Payer: Self-pay | Admitting: *Deleted

## 2019-02-06 ENCOUNTER — Telehealth: Payer: Self-pay | Admitting: *Deleted

## 2019-02-06 NOTE — Telephone Encounter (Signed)
The patient has been called and made aware that his pv angio has been scheduled for 3/18 at 8:30 at [facility]. He will arrive by 6:30.  Instructions will be mailed to him and have been gone over on the phone.  He will call back with any further questions.

## 2019-02-10 ENCOUNTER — Telehealth: Payer: Self-pay | Admitting: *Deleted

## 2019-02-10 NOTE — Telephone Encounter (Signed)
Called the patient to schedule an appointment with Dr. Kirke Corin prior to his angio on 03/04/2019. Appointment has been made for 02/26/2019.

## 2019-02-12 ENCOUNTER — Other Ambulatory Visit: Payer: Self-pay | Admitting: Cardiovascular Disease

## 2019-02-13 ENCOUNTER — Other Ambulatory Visit: Payer: Self-pay | Admitting: Geriatric Medicine

## 2019-02-13 DIAGNOSIS — F17211 Nicotine dependence, cigarettes, in remission: Secondary | ICD-10-CM | POA: Diagnosis not present

## 2019-02-13 DIAGNOSIS — J449 Chronic obstructive pulmonary disease, unspecified: Secondary | ICD-10-CM | POA: Diagnosis not present

## 2019-02-13 DIAGNOSIS — I129 Hypertensive chronic kidney disease with stage 1 through stage 4 chronic kidney disease, or unspecified chronic kidney disease: Secondary | ICD-10-CM | POA: Diagnosis not present

## 2019-02-13 DIAGNOSIS — E7800 Pure hypercholesterolemia, unspecified: Secondary | ICD-10-CM | POA: Diagnosis not present

## 2019-02-13 DIAGNOSIS — N183 Chronic kidney disease, stage 3 (moderate): Secondary | ICD-10-CM | POA: Diagnosis not present

## 2019-02-13 DIAGNOSIS — Z0000 Encounter for general adult medical examination without abnormal findings: Secondary | ICD-10-CM | POA: Diagnosis not present

## 2019-02-13 DIAGNOSIS — E1121 Type 2 diabetes mellitus with diabetic nephropathy: Secondary | ICD-10-CM | POA: Diagnosis not present

## 2019-02-13 DIAGNOSIS — Z1389 Encounter for screening for other disorder: Secondary | ICD-10-CM | POA: Diagnosis not present

## 2019-02-13 DIAGNOSIS — Z79899 Other long term (current) drug therapy: Secondary | ICD-10-CM | POA: Diagnosis not present

## 2019-02-13 DIAGNOSIS — I700 Atherosclerosis of aorta: Secondary | ICD-10-CM | POA: Diagnosis not present

## 2019-02-13 DIAGNOSIS — E1151 Type 2 diabetes mellitus with diabetic peripheral angiopathy without gangrene: Secondary | ICD-10-CM | POA: Diagnosis not present

## 2019-02-20 ENCOUNTER — Inpatient Hospital Stay: Admission: RE | Admit: 2019-02-20 | Payer: Medicare Other | Source: Ambulatory Visit

## 2019-02-25 ENCOUNTER — Ambulatory Visit
Admission: RE | Admit: 2019-02-25 | Discharge: 2019-02-25 | Disposition: A | Discharge status: Home / Self Care | Payer: Medicare Other | Source: Ambulatory Visit | Attending: Geriatric Medicine | Admitting: Geriatric Medicine

## 2019-02-25 DIAGNOSIS — F17211 Nicotine dependence, cigarettes, in remission: Secondary | ICD-10-CM

## 2019-02-25 DIAGNOSIS — Z87891 Personal history of nicotine dependence: Secondary | ICD-10-CM | POA: Diagnosis not present

## 2019-02-25 NOTE — Progress Notes (Signed)
Cardiology Office Note   Date:  02/26/2019   ID:  Harold Parker, DOB April 03, 1946, MRN [id number]  PCP:  Merlene Laughter, MD  Cardiologist:   [name], MD  Chief Complaint  Patient presents with  . other    Pre op for abdominal aortagram with LE extremity. Meds reviewed by the pt. verbally. Pt. c/o "tired legs," shortness of breath.       History of Present Illness: Harold Parker is a 73 y.o. male who presents for a follow-up visit regarding peripheral arterial disease, coronary artery disease and aortic stenosis status post CABG and bioprosthetic aortic valve in March 2019.  Other medical problems include hypertension, hyperlipidemia, type 2 diabetes, COPD and previous tobacco use.   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  He presented in February of 2019 with non-ST elevation myocardial infarction.  Cardiac catheterization revealed severe left main stenosis in addition to significant disease involving OM1 and RCA.  EF was 40 to 45% with moderate aortic stenosis.  The patient underwent CABG and aortic valve replacement in March.  He had postoperative atrial fibrillation that was controlled with amiodarone.  He was discharged home on aspirin and Plavix.  He presented back with an upper GI bleed with a hemoglobin 4.7.  EGD showed duodenal ulcers.  He was taken off dual antiplatelet therapy and ultimately low-dose aspirin was resumed.  He had an echocardiogram done in April, 2019 which showed an EF of 40 to 45% with normal functioning bioprosthetic aortic valve.  The patient has known history of peripheral arterial disease.  ABI done last year was abnormal in the 0.6 range bilaterally.  Duplex showed significant SFA disease worse on the left side.  He has an abdominal aortogram w/ lower extremity scheduled for 03/04/2019.  The patient is doing well today. He has been having bilateral leg pain that is onset by walking both long and  short distances. He tried taking atorvastatin again for 2 days, but he started getting cramps and then stopped taking it. He has shortness of breath from exertion. He got taken off oxygen a few weeks ago. His breathing has improved since taking the furosemide. The CT scan from 3/112020 was clear. He denies chest pain, palpitations or any other related symptoms or complaints at this time.     Past Medical History:  Diagnosis Date  . Bilateral carotid bruits    a. 01/2018 U/S: < 50% bilat ICA stenosis.  Marland Kitchen CAD (coronary artery disease)    a. 1998 s/p MI and BMS [facility] [place]); b. 1999 redo PCI/rotablator in setting of what sounds like ISR;  c. Multiple stress tests over the years - last ~ 2017, reportedly nl; d. 01/2018 NSTEMI/Cath: LM 57m/d, LAD 50p, 40p/m, D1 60ost, OM1 95, RCA 100ost/p w/ L->R collats, EF 45%; e. s/p 3V CABG 02/19/18 (LIMA-LAD, VG-D1, VG-OM)  . Chronic lower back pain   . COPD (chronic obstructive pulmonary disease) (HCC)   . GIB (gastrointestinal bleeding)    a. 02/2018 GIB and anemia w/ Hgb of 4.7 on presentation; b. 03/2018 EGD: 2 nonbleeding duodenal ulcers.  Marland Kitchen HTN (hypertension)   . Hypercholesteremia   . Ischemic cardiomyopathy    a. 01/2018 Echo: EF 40-45%, mid-apicalanteroseptal, ant, apical sev HK, mod apicalinferior HK. Gr2 DD. Mod AS, mild MR, mod dil LA, PASP .  . Moderate aortic stenosis    a. 01/2018 Echo: Mod AS, mean grad (S) , Valve area (VTI) 1.06  cm^2, (Vmax) 1.27 cm^2; b. 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  . Myocardial infarction (HCC) ~ 1998/1999  . 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 02/19/2018 AVR: 25 mm Edwards Inspiris Resilia stented bovine pericardial tissue valve  . 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 Tobacco abuse   . Type II diabetes mellitus (HCC)     Past Surgical History:  Procedure Laterality Date  . AORTIC VALVE REPLACEMENT N/A 02/19/2018    Procedure: AORTIC VALVE REPLACEMENT (AVR);  Surgeon: Purcell Nails, MD;  Location: [facility] OR;  Service: Open Heart Surgery;  Laterality: N/A;  . COLONOSCOPY    . CORONARY ANGIOPLASTY WITH STENT PLACEMENT  ~ 1998/1999  . CORONARY ARTERY BYPASS GRAFT N/A 02/19/2018   Procedure: CORONARY ARTERY BYPASS GRAFTING (CABG) x three , using left internal mammary artery and right leg greater saphenous vein harvested endoscopically;  Surgeon: Purcell Nails, MD;  Location: [facility] OR;  Service: Open Heart Surgery;  Laterality: N/A;  . ESOPHAGOGASTRODUODENOSCOPY (EGD) WITH PROPOFOL N/A 03/18/2018   Procedure: ESOPHAGOGASTRODUODENOSCOPY (EGD) WITH PROPOFOL;  Surgeon: Midge Minium, MD;  Location: [facility] ENDOSCOPY;  Service: Endoscopy;  Laterality: N/A;  . LEFT HEART CATH AND CORONARY ANGIOGRAPHY N/A 02/06/2018   Procedure: LEFT HEART CATH AND CORONARY ANGIOGRAPHY;  Surgeon: Iran Ouch, MD;  Location: [facility] INVASIVE CV LAB;  Service: Cardiovascular;  Laterality: N/A;  . TEE WITHOUT CARDIOVERSION N/A 02/19/2018   Procedure: TRANSESOPHAGEAL ECHOCARDIOGRAM (TEE);  Surgeon: Purcell Nails, MD;  Location: [facility] OR;  Service: Open Heart Surgery;  Laterality: N/A;  . TONSILLECTOMY       Current Outpatient Medications  Medication Sig Dispense Refill  . acetaminophen (TYLENOL) 500 MG tablet Take 1,000 mg by mouth daily as needed for moderate pain or headache.    . ALPRAZolam (XANAX) 0.5 MG tablet Take 0.5 mg by mouth 2 (two) times daily.     . Ascorbic Acid (VITAMIN C) 1000 MG tablet Take 1,000 mg by mouth daily.    Marland Kitchen aspirin EC 81 MG tablet Take 81 mg by mouth daily.    Marland Kitchen esomeprazole (NEXIUM) 40 MG capsule Take 40 mg by mouth at bedtime.     Marland Kitchen ezetimibe (ZETIA) 10 MG tablet Take 10 mg by mouth at bedtime.     . folic acid (FOLVITE) 400 MCG tablet Take 400 mcg by mouth every evening.    . furosemide (LASIX) 40 MG tablet Take 1 tablet (40 mg total) by mouth daily.    Marland Kitchen glipiZIDE (GLUCOTROL) 5 MG tablet Take 0.5 tablets (2.5  mg total) by mouth daily before breakfast. (Patient taking differently: Take 5 mg by mouth See admin instructions. Take 5 mg in the morning and take a second 5 mg dose at night on Mon, Wed, and Fri) 30 tablet 1  . guaiFENesin (MUCINEX) 600 MG 12 hr tablet Take 1,200 mg by mouth 2 (two) times daily as needed for cough or to loosen phlegm.    Marland Kitchen ipratropium-albuterol (DUONEB) 0.5-2.5 (3) MG/3ML SOLN Take 3 mLs by nebulization every 6 (six) hours as needed. (Patient taking differently: Take 3 mLs by nebulization every 6 (six) hours as needed (shortness of breath). ) 360 mL 1  . losartan (COZAAR) 100 MG tablet TAKE 1 TABLET(100 MG) BY MOUTH DAILY (Patient taking differently: Take 100 mg by mouth daily. ) 90 tablet 0  . Melatonin 10 MG CAPS Take 10 mg  by mouth at bedtime.    . metFORMIN (GLUCOPHAGE) 1000 MG tablet TAKE 1 TABLET BY MOUTH TWICE DAILY WITH A MEAL (Patient taking differently: Take 1,000 mg by mouth 2 (two) times daily. ) 60 tablet 0  . metoprolol succinate (TOPROL-XL) 100 MG 24 hr tablet Take 1 tablet (100 mg total) by mouth daily. Take with or immediately following a meal. 90 tablet 0  . PROAIR HFA 108 (90 Base) MCG/ACT inhaler INL 2 PFS PO Q 6 H PRN    . TRELEGY ELLIPTA 100-62.5-25 MCG/INH AEPB INHALE 1 PUFF PO INTO THE LUNGS D    . Vitamin D, Cholecalciferol, 1000 units TABS Take 1,000 Units by mouth every morning.      No current facility-administered medications for this visit.     Allergies:   Patient has no known allergies.    Social History:  The patient  reports that he quit smoking about 12 months ago. His smoking use included cigarettes. He has a 15.00 pack-year smoking history. He has never used smokeless tobacco. He reports current alcohol use. He reports that he does not use drugs.   Family History:  The patient's family history includes Lymphoma in his mother; Peripheral vascular disease in his father.    ROS:  Please see the history of present illness.   Otherwise, review  of systems are positive for none.   All other systems are reviewed and negative.    PHYSICAL EXAM: VS:  BP 130/62 (BP Location: Left Arm, Patient Position: Sitting, Cuff Size: Normal)   Pulse 69   Ht 5\' 5"  (1.651 m)   Wt 179 lb 4 oz (81.3 kg)   SpO2 96%   BMI 29.83 kg/m  , BMI Body mass index is 29.83 kg/m. GEN: Well nourished, well developed, in no acute distress  HEENT: normal  Neck: no JVD, carotid bruits, or masses Cardiac: RRR; no  rubs, or gallops,no edema, no murmur. Respiratory:  clear to auscultation bilaterally, normal work of breathing GI: soft, nontender, nondistended, + BS MS: no deformity or atrophy  Skin: warm and dry, no rash Neuro:  Strength and sensation are intact Psych: euthymic mood, full affect    EKG:  EKG is ordered today. 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  Recent Labs: 03/27/2018: ALT 15; Hemoglobin 12.1; Platelets 275 02/04/2019: BUN 25; Creatinine, Ser 0.92; Potassium 4.6; Sodium 143    Lipid Panel    Component Value Date/Time   CHOL 102 03/27/2018 0900   TRIG 84 03/27/2018 0900   HDL 35 (L) 03/27/2018 0900   CHOLHDL 2.9 03/27/2018 0900   CHOLHDL 3.3 02/06/2018 0244   VLDL 23 02/06/2018 0244   LDLCALC 50 03/27/2018 0900      Wt Readings from Last 3 Encounters:  02/26/19 179 lb 4 oz (81.3 kg)  01/20/19 176 lb (79.8 kg)  10/28/18 170 lb (77.1 kg)       No flowsheet data found.    ASSESSMENT AND PLAN:  1.  Peripheral arterial disease: Severe bilateral leg claudication worse on the left side.  His claudication is lifestyle limiting and due to that, the patient is scheduled for an abdominal aortogram with lower extremity runoff and possible endovascular intervention.  I discussed the procedure in details as well as risks and benefits.  2. Coronary artery disease involving native coronary arteries without angina: Status post CABG in March 2019.  He is doing  better overall.  3.  Status  post bioprosthetic aortic valve replacement for aortic stenosis: Most recent echo showed normal functioning bioprosthetic aortic valve.  4.  Chronic systolic heart failure with mildly reduced LV systolic function.  Echocardiogram in February showed an EF of 40 to 45% with moderate to severe pulmonary hypertension.  Since then, the dose of furosemide was increased to 40 mg once daily.  He reports significant improvement in dyspnea.  Continue treatment with Toprol and losartan.    5.  Hyperlipidemia: Currently on  Zetia.  Atorvastatin was discontinued due to leg cramps.  It is possible that some of his cramps are related to peripheral arterial disease.  I will consider adding small dose rosuvastatin after lower extremity revascularization.   Disposition:   FU with me in 1 month  I, Jesus Reyes am acting as a scribe for Rozalia Dino, M.D.  I have reviewed the above documentation for accuracy and completeness, and I agree with the above.    Signed, Kateryn Marasigan, MD 02/26/19 [facility], [place] [phone] 

## 2019-02-25 NOTE — H&P (View-Only) (Signed)
Cardiology Office Note   Date:  02/26/2019   ID:  Harold Parker, DOB April 03, 1946, MRN [id number]  PCP:  Merlene Laughter, MD  Cardiologist:   [name], MD  Chief Complaint  Patient presents with  . other    Pre op for abdominal aortagram with LE extremity. Meds reviewed by the pt. verbally. Pt. c/o "tired legs," shortness of breath.       History of Present Illness: Harold Parker is a 73 y.o. male who presents for a follow-up visit regarding peripheral arterial disease, coronary artery disease and aortic stenosis status post CABG and bioprosthetic aortic valve in March 2019.  Other medical problems include hypertension, hyperlipidemia, type 2 diabetes, COPD and previous tobacco use.   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  He presented in February of 2019 with non-ST elevation myocardial infarction.  Cardiac catheterization revealed severe left main stenosis in addition to significant disease involving OM1 and RCA.  EF was 40 to 45% with moderate aortic stenosis.  The patient underwent CABG and aortic valve replacement in March.  He had postoperative atrial fibrillation that was controlled with amiodarone.  He was discharged home on aspirin and Plavix.  He presented back with an upper GI bleed with a hemoglobin 4.7.  EGD showed duodenal ulcers.  He was taken off dual antiplatelet therapy and ultimately low-dose aspirin was resumed.  He had an echocardiogram done in April, 2019 which showed an EF of 40 to 45% with normal functioning bioprosthetic aortic valve.  The patient has known history of peripheral arterial disease.  ABI done last year was abnormal in the 0.6 range bilaterally.  Duplex showed significant SFA disease worse on the left side.  He has an abdominal aortogram w/ lower extremity scheduled for 03/04/2019.  The patient is doing well today. He has been having bilateral leg pain that is onset by walking both long and  short distances. He tried taking atorvastatin again for 2 days, but he started getting cramps and then stopped taking it. He has shortness of breath from exertion. He got taken off oxygen a few weeks ago. His breathing has improved since taking the furosemide. The CT scan from 3/112020 was clear. He denies chest pain, palpitations or any other related symptoms or complaints at this time.     Past Medical History:  Diagnosis Date  . Bilateral carotid bruits    a. 01/2018 U/S: < 50% bilat ICA stenosis.  Marland Kitchen CAD (coronary artery disease)    a. 1998 s/p MI and BMS [facility] [place]); b. 1999 redo PCI/rotablator in setting of what sounds like ISR;  c. Multiple stress tests over the years - last ~ 2017, reportedly nl; d. 01/2018 NSTEMI/Cath: LM 57m/d, LAD 50p, 40p/m, D1 60ost, OM1 95, RCA 100ost/p w/ L->R collats, EF 45%; e. s/p 3V CABG 02/19/18 (LIMA-LAD, VG-D1, VG-OM)  . Chronic lower back pain   . COPD (chronic obstructive pulmonary disease) (HCC)   . GIB (gastrointestinal bleeding)    a. 02/2018 GIB and anemia w/ Hgb of 4.7 on presentation; b. 03/2018 EGD: 2 nonbleeding duodenal ulcers.  Marland Kitchen HTN (hypertension)   . Hypercholesteremia   . Ischemic cardiomyopathy    a. 01/2018 Echo: EF 40-45%, mid-apicalanteroseptal, ant, apical sev HK, mod apicalinferior HK. Gr2 DD. Mod AS, mild MR, mod dil LA, PASP .  . Moderate aortic stenosis    a. 01/2018 Echo: Mod AS, mean grad (S) , Valve area (VTI) 1.06  cm^2, (Vmax) 1.27 cm^2; b. 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  . Myocardial infarction (HCC) ~ 1998/1999  . 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 02/19/2018 AVR: 25 mm Edwards Inspiris Resilia stented bovine pericardial tissue valve  . 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 Tobacco abuse   . Type II diabetes mellitus (HCC)     Past Surgical History:  Procedure Laterality Date  . AORTIC VALVE REPLACEMENT N/A 02/19/2018    Procedure: AORTIC VALVE REPLACEMENT (AVR);  Surgeon: Purcell Nails, MD;  Location: [facility] OR;  Service: Open Heart Surgery;  Laterality: N/A;  . COLONOSCOPY    . CORONARY ANGIOPLASTY WITH STENT PLACEMENT  ~ 1998/1999  . CORONARY ARTERY BYPASS GRAFT N/A 02/19/2018   Procedure: CORONARY ARTERY BYPASS GRAFTING (CABG) x three , using left internal mammary artery and right leg greater saphenous vein harvested endoscopically;  Surgeon: Purcell Nails, MD;  Location: [facility] OR;  Service: Open Heart Surgery;  Laterality: N/A;  . ESOPHAGOGASTRODUODENOSCOPY (EGD) WITH PROPOFOL N/A 03/18/2018   Procedure: ESOPHAGOGASTRODUODENOSCOPY (EGD) WITH PROPOFOL;  Surgeon: Midge Minium, MD;  Location: [facility] ENDOSCOPY;  Service: Endoscopy;  Laterality: N/A;  . LEFT HEART CATH AND CORONARY ANGIOGRAPHY N/A 02/06/2018   Procedure: LEFT HEART CATH AND CORONARY ANGIOGRAPHY;  Surgeon: Iran Ouch, MD;  Location: [facility] INVASIVE CV LAB;  Service: Cardiovascular;  Laterality: N/A;  . TEE WITHOUT CARDIOVERSION N/A 02/19/2018   Procedure: TRANSESOPHAGEAL ECHOCARDIOGRAM (TEE);  Surgeon: Purcell Nails, MD;  Location: [facility] OR;  Service: Open Heart Surgery;  Laterality: N/A;  . TONSILLECTOMY       Current Outpatient Medications  Medication Sig Dispense Refill  . acetaminophen (TYLENOL) 500 MG tablet Take 1,000 mg by mouth daily as needed for moderate pain or headache.    . ALPRAZolam (XANAX) 0.5 MG tablet Take 0.5 mg by mouth 2 (two) times daily.     . Ascorbic Acid (VITAMIN C) 1000 MG tablet Take 1,000 mg by mouth daily.    Marland Kitchen aspirin EC 81 MG tablet Take 81 mg by mouth daily.    Marland Kitchen esomeprazole (NEXIUM) 40 MG capsule Take 40 mg by mouth at bedtime.     Marland Kitchen ezetimibe (ZETIA) 10 MG tablet Take 10 mg by mouth at bedtime.     . folic acid (FOLVITE) 400 MCG tablet Take 400 mcg by mouth every evening.    . furosemide (LASIX) 40 MG tablet Take 1 tablet (40 mg total) by mouth daily.    Marland Kitchen glipiZIDE (GLUCOTROL) 5 MG tablet Take 0.5 tablets (2.5  mg total) by mouth daily before breakfast. (Patient taking differently: Take 5 mg by mouth See admin instructions. Take 5 mg in the morning and take a second 5 mg dose at night on Mon, Wed, and Fri) 30 tablet 1  . guaiFENesin (MUCINEX) 600 MG 12 hr tablet Take 1,200 mg by mouth 2 (two) times daily as needed for cough or to loosen phlegm.    Marland Kitchen ipratropium-albuterol (DUONEB) 0.5-2.5 (3) MG/3ML SOLN Take 3 mLs by nebulization every 6 (six) hours as needed. (Patient taking differently: Take 3 mLs by nebulization every 6 (six) hours as needed (shortness of breath). ) 360 mL 1  . losartan (COZAAR) 100 MG tablet TAKE 1 TABLET(100 MG) BY MOUTH DAILY (Patient taking differently: Take 100 mg by mouth daily. ) 90 tablet 0  . Melatonin 10 MG CAPS Take 10 mg  by mouth at bedtime.    . metFORMIN (GLUCOPHAGE) 1000 MG tablet TAKE 1 TABLET BY MOUTH TWICE DAILY WITH A MEAL (Patient taking differently: Take 1,000 mg by mouth 2 (two) times daily. ) 60 tablet 0  . metoprolol succinate (TOPROL-XL) 100 MG 24 hr tablet Take 1 tablet (100 mg total) by mouth daily. Take with or immediately following a meal. 90 tablet 0  . PROAIR HFA 108 (90 Base) MCG/ACT inhaler INL 2 PFS PO Q 6 H PRN    . TRELEGY ELLIPTA 100-62.5-25 MCG/INH AEPB INHALE 1 PUFF PO INTO THE LUNGS D    . Vitamin D, Cholecalciferol, 1000 units TABS Take 1,000 Units by mouth every morning.      No current facility-administered medications for this visit.     Allergies:   Patient has no known allergies.    Social History:  The patient  reports that he quit smoking about 12 months ago. His smoking use included cigarettes. He has a 15.00 pack-year smoking history. He has never used smokeless tobacco. He reports current alcohol use. He reports that he does not use drugs.   Family History:  The patient's family history includes Lymphoma in his mother; Peripheral vascular disease in his father.    ROS:  Please see the history of present illness.   Otherwise, review  of systems are positive for none.   All other systems are reviewed and negative.    PHYSICAL EXAM: VS:  BP 130/62 (BP Location: Left Arm, Patient Position: Sitting, Cuff Size: Normal)   Pulse 69   Ht 5\' 5"  (1.651 m)   Wt 179 lb 4 oz (81.3 kg)   SpO2 96%   BMI 29.83 kg/m  , BMI Body mass index is 29.83 kg/m. GEN: Well nourished, well developed, in no acute distress  HEENT: normal  Neck: no JVD, carotid bruits, or masses Cardiac: RRR; no  rubs, or gallops,no edema, no murmur. Respiratory:  clear to auscultation bilaterally, normal work of breathing GI: soft, nontender, nondistended, + BS MS: no deformity or atrophy  Skin: warm and dry, no rash Neuro:  Strength and sensation are intact Psych: euthymic mood, full affect    EKG:  EKG is ordered today. 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  Recent Labs: 03/27/2018: ALT 15; Hemoglobin 12.1; Platelets 275 02/04/2019: BUN 25; Creatinine, Ser 0.92; Potassium 4.6; Sodium 143    Lipid Panel    Component Value Date/Time   CHOL 102 03/27/2018 0900   TRIG 84 03/27/2018 0900   HDL 35 (L) 03/27/2018 0900   CHOLHDL 2.9 03/27/2018 0900   CHOLHDL 3.3 02/06/2018 0244   VLDL 23 02/06/2018 0244   LDLCALC 50 03/27/2018 0900      Wt Readings from Last 3 Encounters:  02/26/19 179 lb 4 oz (81.3 kg)  01/20/19 176 lb (79.8 kg)  10/28/18 170 lb (77.1 kg)       No flowsheet data found.    ASSESSMENT AND PLAN:  1.  Peripheral arterial disease: Severe bilateral leg claudication worse on the left side.  His claudication is lifestyle limiting and due to that, the patient is scheduled for an abdominal aortogram with lower extremity runoff and possible endovascular intervention.  I discussed the procedure in details as well as risks and benefits.  2. Coronary artery disease involving native coronary arteries without angina: Status post CABG in March 2019.  He is doing  better overall.  3.  Status  post bioprosthetic aortic valve replacement for aortic stenosis: Most recent echo showed normal functioning bioprosthetic aortic valve.  4.  Chronic systolic heart failure with mildly reduced LV systolic function.  Echocardiogram in February showed an EF of 40 to 45% with moderate to severe pulmonary hypertension.  Since then, the dose of furosemide was increased to 40 mg once daily.  He reports significant improvement in dyspnea.  Continue treatment with Toprol and losartan.    5.  Hyperlipidemia: Currently on  Zetia.  Atorvastatin was discontinued due to leg cramps.  It is possible that some of his cramps are related to peripheral arterial disease.  I will consider adding small dose rosuvastatin after lower extremity revascularization.   Disposition:   FU with me in 1 month  I, Jesus Reyes am acting as a Neurosurgeonscribe for Lorine BearsMuhammad Ariann Khaimov, M.D.  I have reviewed the above documentation for accuracy and completeness, and I agree with the above.    Signed, Lorine BearsMuhammad Erskine Steinfeldt, MD 02/26/19 [facility] [place] [phone]

## 2019-02-26 ENCOUNTER — Other Ambulatory Visit: Payer: Self-pay

## 2019-02-26 ENCOUNTER — Ambulatory Visit (INDEPENDENT_AMBULATORY_CARE_PROVIDER_SITE_OTHER): Payer: Medicare Other | Admitting: Cardiovascular Disease

## 2019-02-26 ENCOUNTER — Encounter: Payer: Self-pay | Admitting: Cardiovascular Disease

## 2019-02-26 VITALS — BP 130/62 | HR 69 | Ht 65.0 in | Wt 179.2 lb

## 2019-02-26 DIAGNOSIS — I5022 Chronic systolic (congestive) heart failure: Secondary | ICD-10-CM

## 2019-02-26 DIAGNOSIS — E785 Hyperlipidemia, unspecified: Secondary | ICD-10-CM

## 2019-02-26 DIAGNOSIS — I2510 Atherosclerotic heart disease of native coronary artery without angina pectoris: Secondary | ICD-10-CM | POA: Diagnosis not present

## 2019-02-26 DIAGNOSIS — I739 Peripheral vascular disease, unspecified: Secondary | ICD-10-CM | POA: Diagnosis not present

## 2019-02-26 NOTE — Patient Instructions (Signed)
Medication Instructions:  No changes If you need a refill on your cardiac medications before your next appointment, please call your pharmacy.   Lab work: None ordered  Testing/Procedures: None ordered  Follow-Up: At [facility], you and your health needs are our priority.  As part of our continuing mission to provide you with exceptional heart care, we have created designated Provider Care Teams.  These Care Teams include your primary Cardiologist (physician) and Advanced Practice Providers (APPs -  Physician Assistants and Nurse Practitioners) who all work together to provide you with the care you need, when you need it.   You will need a follow up appointment in 1 month.  You may see [name], MD.

## 2019-03-02 ENCOUNTER — Encounter: Payer: Self-pay | Admitting: Thoracic Surgery (Cardiothoracic Vascular Surgery)

## 2019-03-02 ENCOUNTER — Other Ambulatory Visit: Payer: Self-pay

## 2019-03-02 ENCOUNTER — Ambulatory Visit (INDEPENDENT_AMBULATORY_CARE_PROVIDER_SITE_OTHER): Payer: Medicare Other | Admitting: Thoracic Surgery (Cardiothoracic Vascular Surgery)

## 2019-03-02 VITALS — BP 143/69 | HR 79 | Resp 18 | Ht 65.0 in | Wt 179.0 lb

## 2019-03-02 DIAGNOSIS — Z951 Presence of aortocoronary bypass graft: Secondary | ICD-10-CM

## 2019-03-02 DIAGNOSIS — I2510 Atherosclerotic heart disease of native coronary artery without angina pectoris: Secondary | ICD-10-CM

## 2019-03-02 DIAGNOSIS — Z953 Presence of xenogenic heart valve: Secondary | ICD-10-CM | POA: Diagnosis not present

## 2019-03-02 NOTE — Patient Instructions (Addendum)
Continue all previous medications without any changes at this time  You may resume unrestricted physical activity without any particular limitations at this time.  Make every effort to stay physically active, get some type of exercise on a regular basis, and stick to a "heart healthy diet".  The long term benefits for regular exercise and a healthy diet are critically important to your overall health and wellbeing.  Make every effort to keep your diabetes under very tight control.  Follow up closely with your primary care physician or endocrinologist and strive to keep their hemoglobin A1c levels as low as possible, preferably near or below 6.0.  The long term benefits of strict control of diabetes are far reaching and critically important for your overall health and survival.  Endocarditis is a potentially serious infection of heart valves or inside lining of the heart.  It occurs more commonly in patients with diseased heart valves (such as patient's with aortic or mitral valve disease) and in patients who have undergone heart valve repair or replacement.  Certain surgical and dental procedures may put you at risk, such as dental cleaning, other dental procedures, or any surgery involving the respiratory, urinary, gastrointestinal tract, gallbladder or prostate gland.   To minimize your chances for develooping endocarditis, maintain good oral health and seek prompt medical attention for any infections involving the mouth, teeth, gums, skin or urinary tract.    Always notify your doctor or dentist about your underlying heart valve condition before having any invasive procedures. You will need to take antibiotics before certain procedures, including all routine dental cleanings or other dental procedures.  Your cardiologist or dentist should prescribe these antibiotics for you to be taken ahead of time.      

## 2019-03-02 NOTE — Progress Notes (Signed)
[address]       [name] [address]             [phone]     CARDIOTHORACIC SURGERY OFFICE NOTE  Referring Provider is Lennette Bihari, MD Primary Cardiologist is [name], MD PCP is Merlene Laughter, MD   HPI:  Patient is a 73 year old male with known history of coronary artery disease, hypertension, hyperlipidemia, type 2 diabetes mellitus,andCOPD with longstanding tobacco abuse, COPD with chronic bronchitis and peripheral arterial disease who  returns to the office today for routine follow-up status post aortic valve replacement using a stented bioprosthetic tissue valve and coronary artery bypass grafting x3 on February 19, 2018.  He was last seen here in our office on June 30, 2018 at which time he was doing well.  Since then he has continued to do very well from a cardiac standpoint.  He has been followed closely by Dr. Kirke Corin and he has been scheduled for aortobifemoral arteriogram later this week to evaluate peripheral arterial disease with symptomatic claudication.  He has continued to stay away from cigarette smoking.  He joined a gym and has been exercising on a regular basis.  He states that his diabetes has been under good control with recent hemoglobin A1c measured 7.0.  He denies any symptoms of exertional chest pain or chest tightness.  He gets short of breath with strenuous exertion but this is stable and does not limit him to any significant degree.  Overall he is doing well and quite pleased with his outcome and long-term prognosis.   Current Outpatient Medications  Medication Sig Dispense Refill  . acetaminophen (TYLENOL) 500 MG tablet Take 1,000 mg by mouth daily as needed for moderate pain or headache.    . ALPRAZolam (XANAX) 0.5 MG tablet Take 0.5 mg by mouth 2 (two) times daily.     . Ascorbic Acid (VITAMIN C) 1000 MG tablet Take 1,000 mg by mouth daily.    Marland Kitchen aspirin EC 81 MG tablet Take 81 mg by mouth daily.    Marland Kitchen esomeprazole (NEXIUM) 40  MG capsule Take 40 mg by mouth at bedtime.     Marland Kitchen ezetimibe (ZETIA) 10 MG tablet Take 10 mg by mouth at bedtime.     . folic acid (FOLVITE) 400 MCG tablet Take 400 mcg by mouth every evening.    . furosemide (LASIX) 40 MG tablet Take 1 tablet (40 mg total) by mouth daily.    Marland Kitchen glipiZIDE (GLUCOTROL) 5 MG tablet Take 0.5 tablets (2.5 mg total) by mouth daily before breakfast. (Patient taking differently: Take 5 mg by mouth See admin instructions. Take 5 mg in the morning and take a second 5 mg dose at night on Mon, Wed, and Fri) 30 tablet 1  . guaiFENesin (MUCINEX) 600 MG 12 hr tablet Take 1,200 mg by mouth 2 (two) times daily as needed for cough or to loosen phlegm.    Marland Kitchen ipratropium-albuterol (DUONEB) 0.5-2.5 (3) MG/3ML SOLN Take 3 mLs by nebulization every 6 (six) hours as needed. (Patient taking differently: Take 3 mLs by nebulization every 6 (six) hours as needed (shortness of breath). ) 360 mL 1  . losartan (COZAAR) 100 MG tablet TAKE 1 TABLET(100 MG) BY MOUTH DAILY (Patient taking differently: Take 100 mg by mouth daily. ) 90 tablet 0  . Melatonin 10 MG CAPS Take 10 mg by mouth at bedtime.    . metFORMIN (GLUCOPHAGE) 1000 MG tablet TAKE 1 TABLET BY MOUTH  TWICE DAILY WITH A MEAL (Patient taking differently: Take 1,000 mg by mouth 2 (two) times daily. ) 60 tablet 0  . metoprolol succinate (TOPROL-XL) 100 MG 24 hr tablet Take 1 tablet (100 mg total) by mouth daily. Take with or immediately following a meal. 90 tablet 0  . PROAIR HFA 108 (90 Base) MCG/ACT inhaler INL 2 PFS PO Q 6 H PRN    . TRELEGY ELLIPTA 100-62.5-25 MCG/INH AEPB INHALE 1 PUFF PO INTO THE LUNGS D    . Vitamin D, Cholecalciferol, 1000 units TABS Take 1,000 Units by mouth every morning.      No current facility-administered medications for this visit.       Physical Exam:   BP (!) 143/69 (BP Location: Right Arm, Patient Position: Sitting, Cuff Size: Large)   Pulse 79   Resp 18   Ht 5\' 5"  (1.651 m)   Wt 179 lb (81.2 kg)   SpO2  93% Comment: ON RA  BMI 29.79 kg/m   General:  Well-appearing  Chest:   Clear to auscultation  CV:   Regular rate and rhythm without murmur  Incisions:  Completely healed, sternum is stable  Abdomen:  Soft nontender  Extremities:  Warm and well-perfused  Diagnostic Tests:   ECHOCARDIOGRAM REPORT       Patient Name:   Harold Parker Date of Exam: 01/22/2019 Medical Rec #:  [id number]     Height:       66.0 in Accession #:    [id number]    Weight:       176.0 lb Date of Birth:  11-15-1946     BSA:          1.89 m Patient Age:    72 years      BP:           148/68 mmHg Patient Gender: M             HR:           72 bpm. Exam Location:  [facility]    Procedure: 2D Echo, Cardiac Doppler and Color Doppler  Indications:    I35.9, Aortic vavle disease   History:        Patient has prior history of Echocardiogram examinations, most                 recent 04/10/2018. CHF and Cardiomyopathy, CAD and Previous                 Myocardial Infarction, Prior CABG, COPD; Signs/Symptoms:                 Dyspnea; Risk Factors: Diabetes, Dyslipidemia, Hypertension and                 Former Smoker. Patient had an increase in dyspnea about 2 weeks                 ago.   Sonographer:    Quentin Ore RDMS, RVT, RDCS Referring Phys: [id number] A [facility]    Sonographer Comments: Suboptimal parasternal window and Technically difficult study due to poor echo windows. Aortic valve Doppler obtained only in apical 4ch view due to limited windows IMPRESSIONS    1. The left ventricle has mild-moderately reduced systolic function of 40-45%. The cavity size was normal. There is mildly increased left ventricular wall thickness. Echo evidence of impaired diastolic relaxation.  2. There is severe hypokinesis of the apical region, moderate hypokinesis of the lateral wall, moderate hypokinesis of the  mid-apical anteroseptal and septal walls  3. The right ventricle has moderately reduced systolic function.  The cavity was moderately enlarged. There is no increase in right ventricular wall thickness. Right ventricular systolic pressure is severely elevated with an estimated pressure of 66.6  mmHg.  4. Left atrial size was mildly dilated.  5. Bioprosthetic aortic valve, Normal gradient for prosthetic valve  6. The inferior vena cava was dilated in size with <50% respiratory variability.  FINDINGS  Left Ventricle: The left ventricle has mild-moderately reduced systolic function of 40-45%. The cavity size was normal. There is mildly increased left ventricular wall thickness. Echo evidence of impaired diastolic relaxation There is moderate  hypokinesis of the mid-apical anteroseptal, septal and anterior left ventricular segments. There is severe hypokinesis of the entire apical left ventricular segment. There is moderate hypokinesis of the entire lateral left ventricular segment. Right Ventricle: The right ventricle has moderately reduced systolic function. The cavity was moderately enlarged. There is no increase in right ventricular wall thickness. Right ventricular systolic pressure is severely elevated with an estimated  pressure of 66.6 mmHg. Left Atrium: left atrial size was mildly dilated Right Atrium: right atrial size was normal in size Interatrial Septum: No atrial level shunt detected by color flow Doppler.  Pericardium: There is no evidence of pericardial effusion. Mitral Valve: The mitral valve is normal in structure. Mitral valve regurgitation is mild by color flow Doppler. Tricuspid Valve: The tricuspid valve is normal in structure. Tricuspid valve regurgitation is mild by color flow Doppler. Aortic Valve: Bioprosthetic aortic valve Aortic valve regurgitation was not visualized by color flow Doppler. Pulmonic Valve: The pulmonic valve was normal in structure. Pulmonic valve regurgitation is not visualized by color flow Doppler. Venous: The inferior vena cava measures 2.40 cm, is dilated  in size with less than 50% respiratory variability.   LEFT VENTRICLE PLAX 2D (Teich)              Biplane EF (MOD) LV EF:          45.9 %       LV Biplane EF:   44.7 % LVIDd:          4.80 cm      LV A4C EF:       42.8 % LVIDs:          3.70 cm      LV A2C EF:       47.0 % LV PW:          1.30 cm LV IVS:         1.50 cm      Diastology LVOT diam:      2.30 cm      LV e' lateral:   6.20 cm/s LV SV:          49 ml        LV E/e' lateral: 14.2 LVOT Area:      4.15 cm     LV e' medial:    2.94 cm/s                              LV E/e' medial:  29.9 LV Volumes (MOD) LV area d, A2C:    43.30 cm LV area d, A4C:    40.10 cm LV area s, A2C:    30.90 cm LV area s, A4C:    29.40 cm LV major d, A2C:   9.66 cm LV major d, A4C:  9.81 cm LV major s, A2C:   9.41 cm LV major s, A4C:   9.21 cm LV vol d, MOD A2C: 163.0 ml LV vol d, MOD A4C: 137.0 ml LV vol s, MOD A2C: 86.4 ml LV vol s, MOD A4C: 78.4 ml LV SV MOD A2C:     76.6 ml LV SV MOD A4C:     137.0 ml LV SV MOD BP:      67.2 ml  RIGHT VENTRICLE RV S prime:     6.53 cm/s TAPSE (M-mode): 1.4 cm RVSP:           66.6 mmHg  LEFT ATRIUM             Index       RIGHT ATRIUM           Index LA diam:        4.80 cm 2.53 cm/m  RA Pressure: 10 mmHg LA Vol (A2C):   69.0 ml 36.43 ml/m RA Area:     25.50 cm LA Vol (A4C):   69.5 ml 36.69 ml/m RA Volume:   91.00 ml  48.05 ml/m LA Biplane Vol: 70.9 ml 37.43 ml/m  AORTIC VALVE AV Area (Vmean):   1.80 cm AV Area (VTI):     1.57 cm AV Vmean:          106.000 cm/s AV VTI:            0.379 m AV Mean Grad:      5.0 mmHg LVOT Vmean:        46.000 cm/s LVOT VTI:          0.143 m LVOT/AV VTI ratio: 0.38   AORTA Ao Root diam: 3.00 cm  MITRAL VALVE              TR Peak grad: 56.6 mmHg MV Area (PHT): 4.06 cm   TR Vmax:      376.00 cm/s MV PHT:        54.23 msec RVSP:         66.6 mmHg MV Decel Time: 187 msec MV E velocity: 87.80 cm/s MV A velocity: 78.60 cm/s MV E/A ratio:  1.12   IVC IVC diam: 2.40 cm    Julien Nordmann MD Electronically signed by Julien Nordmann MD Signature Date/Time: 01/23/2019/8:07:01 PM      Impression:  Patient is doing very well from a cardiac standpoint approximately 1 year status post aortic valve replacement using a bioprosthetic tissue valve and coronary artery bypass grafting.  Recent follow-up transthoracic echocardiogram looks good with normal functioning bioprosthetic tissue valve in the aortic position.  Left ventricular function appears stable.  Plan:  We have not recommended any change to the patient's current medications.  I have encouraged the patient to continue to increase his physical activity without any particular limitations.  We discussed the many benefits of regular exercise and a heart healthy diet.  We discussed the importance of adherence to a diabetic diet, close attention to glycemic control, and long-term follow-up for his diabetes. The patient has been reminded regarding the importance of dental hygiene and the lifelong need for antibiotic prophylaxis for all dental cleanings and other related invasive procedures.  The patient will continue to follow-up regularly with Dr. Kirke Corin.  He will return to our office in the future only should specific problems or questions arise.  I spent in excess of 15 minutes during the conduct of this office consultation and >50% of this time involved direct face-to-face encounter with  the patient for counseling and/or coordination of their care.    Salvatore Decent. Cornelius Moras, MD 03/02/2019 9:48 AM

## 2019-03-03 ENCOUNTER — Telehealth: Payer: Self-pay | Admitting: *Deleted

## 2019-03-03 NOTE — Telephone Encounter (Signed)
Pt contacted pre-PV procedure  scheduled at [facility] for: Wednesday March 04, 2019 8:30 AM Verified arrival time and place: [facility] Main Entrance A at: 6:30 AM  No solid food after midnight prior to cath, clear liquids until 5 AM day of procedure. Contrast allergy: no  Hold: Furosemide-AM of procedure. Glucotrol-AM of procedure. Metformin-day of procedure and 48 hours post procedure.  Except hold medications AM meds can be  taken pre-cath with sip of water including: ASA 81mg   Confirmed patient has responsible person to drive home post procedure and observe 24 hours after arriving home: yes

## 2019-03-04 ENCOUNTER — Other Ambulatory Visit: Payer: Self-pay

## 2019-03-04 ENCOUNTER — Encounter (HOSPITAL_COMMUNITY)
Admission: RE | Disposition: A | Discharge status: Home / Self Care | Payer: Self-pay | Source: Home / Self Care | Attending: Cardiovascular Disease

## 2019-03-04 ENCOUNTER — Ambulatory Visit (HOSPITAL_COMMUNITY)
Admission: RE | Admit: 2019-03-04 | Discharge: 2019-03-04 | Disposition: A | Discharge status: Home / Self Care | Payer: Medicare Other | Attending: Cardiovascular Disease | Admitting: Cardiovascular Disease

## 2019-03-04 ENCOUNTER — Encounter (HOSPITAL_COMMUNITY): Payer: Self-pay | Admitting: Cardiovascular Disease

## 2019-03-04 DIAGNOSIS — I110 Hypertensive heart disease with heart failure: Secondary | ICD-10-CM | POA: Insufficient documentation

## 2019-03-04 DIAGNOSIS — I2510 Atherosclerotic heart disease of native coronary artery without angina pectoris: Secondary | ICD-10-CM | POA: Insufficient documentation

## 2019-03-04 DIAGNOSIS — E1151 Type 2 diabetes mellitus with diabetic peripheral angiopathy without gangrene: Secondary | ICD-10-CM | POA: Insufficient documentation

## 2019-03-04 DIAGNOSIS — I4891 Unspecified atrial fibrillation: Secondary | ICD-10-CM | POA: Insufficient documentation

## 2019-03-04 DIAGNOSIS — Z7982 Long term (current) use of aspirin: Secondary | ICD-10-CM | POA: Diagnosis not present

## 2019-03-04 DIAGNOSIS — I255 Ischemic cardiomyopathy: Secondary | ICD-10-CM | POA: Diagnosis not present

## 2019-03-04 DIAGNOSIS — R0989 Other specified symptoms and signs involving the circulatory and respiratory systems: Secondary | ICD-10-CM | POA: Diagnosis not present

## 2019-03-04 DIAGNOSIS — I350 Nonrheumatic aortic (valve) stenosis: Secondary | ICD-10-CM | POA: Insufficient documentation

## 2019-03-04 DIAGNOSIS — I252 Old myocardial infarction: Secondary | ICD-10-CM | POA: Diagnosis not present

## 2019-03-04 DIAGNOSIS — Z951 Presence of aortocoronary bypass graft: Secondary | ICD-10-CM | POA: Insufficient documentation

## 2019-03-04 DIAGNOSIS — Z87891 Personal history of nicotine dependence: Secondary | ICD-10-CM | POA: Diagnosis not present

## 2019-03-04 DIAGNOSIS — E7800 Pure hypercholesterolemia, unspecified: Secondary | ICD-10-CM | POA: Diagnosis not present

## 2019-03-04 DIAGNOSIS — I272 Pulmonary hypertension, unspecified: Secondary | ICD-10-CM | POA: Insufficient documentation

## 2019-03-04 DIAGNOSIS — Z7984 Long term (current) use of oral hypoglycemic drugs: Secondary | ICD-10-CM | POA: Insufficient documentation

## 2019-03-04 DIAGNOSIS — Z7902 Long term (current) use of antithrombotics/antiplatelets: Secondary | ICD-10-CM | POA: Diagnosis not present

## 2019-03-04 DIAGNOSIS — Z79899 Other long term (current) drug therapy: Secondary | ICD-10-CM | POA: Insufficient documentation

## 2019-03-04 DIAGNOSIS — I5022 Chronic systolic (congestive) heart failure: Secondary | ICD-10-CM | POA: Insufficient documentation

## 2019-03-04 DIAGNOSIS — Z955 Presence of coronary angioplasty implant and graft: Secondary | ICD-10-CM | POA: Diagnosis not present

## 2019-03-04 DIAGNOSIS — Z953 Presence of xenogenic heart valve: Secondary | ICD-10-CM | POA: Diagnosis not present

## 2019-03-04 DIAGNOSIS — J449 Chronic obstructive pulmonary disease, unspecified: Secondary | ICD-10-CM | POA: Diagnosis not present

## 2019-03-04 DIAGNOSIS — E785 Hyperlipidemia, unspecified: Secondary | ICD-10-CM | POA: Diagnosis not present

## 2019-03-04 DIAGNOSIS — I70212 Atherosclerosis of native arteries of extremities with intermittent claudication, left leg: Secondary | ICD-10-CM | POA: Insufficient documentation

## 2019-03-04 DIAGNOSIS — Z8249 Family history of ischemic heart disease and other diseases of the circulatory system: Secondary | ICD-10-CM | POA: Insufficient documentation

## 2019-03-04 DIAGNOSIS — I739 Peripheral vascular disease, unspecified: Secondary | ICD-10-CM

## 2019-03-04 HISTORY — PX: ABDOMINAL AORTOGRAM: CATH118222

## 2019-03-04 HISTORY — PX: PERIPHERAL VASCULAR ATHERECTOMY: CATH118256

## 2019-03-04 HISTORY — PX: LOWER EXTREMITY ANGIOGRAPHY: CATH118251

## 2019-03-04 LAB — POCT ACTIVATED CLOTTING TIME: Activated Clotting Time: 274 seconds

## 2019-03-04 LAB — GLUCOSE, CAPILLARY: Glucose-Capillary: 190 mg/dL — ABNORMAL HIGH (ref 70–99)

## 2019-03-04 SURGERY — ABDOMINAL AORTOGRAM
Anesthesia: LOCAL

## 2019-03-04 MED ORDER — HEPARIN (PORCINE) IN NACL 1000-0.9 UT/500ML-% IV SOLN
INTRAVENOUS | Status: AC
  Filled 2019-03-04: qty 1000

## 2019-03-04 MED ORDER — SODIUM CHLORIDE 0.9 % IV SOLN: 250.0000 mL | INTRAVENOUS | Status: DC | PRN

## 2019-03-04 MED ORDER — VIPERSLIDE LUBRICANT OPTIME
TOPICAL | Status: DC | PRN
  Administered 2019-03-04: 10:00:00 via SURGICAL_CAVITY

## 2019-03-04 MED ORDER — SODIUM CHLORIDE 0.9% FLUSH: 3.0000 mL | INTRAVENOUS | Status: DC | PRN

## 2019-03-04 MED ORDER — MIDAZOLAM HCL 2 MG/2ML IJ SOLN
INTRAMUSCULAR | Status: AC
  Filled 2019-03-04: qty 2

## 2019-03-04 MED ORDER — FENTANYL CITRATE (PF) 100 MCG/2ML IJ SOLN
INTRAMUSCULAR | Status: DC | PRN
  Administered 2019-03-04 (×4): 25 ug via INTRAVENOUS

## 2019-03-04 MED ORDER — ACETAMINOPHEN 325 MG PO TABS: 650.0000 mg | ORAL_TABLET | ORAL | Status: DC | PRN

## 2019-03-04 MED ORDER — HEPARIN SODIUM (PORCINE) 1000 UNIT/ML IJ SOLN
INTRAMUSCULAR | Status: AC
  Filled 2019-03-04: qty 1

## 2019-03-04 MED ORDER — SODIUM CHLORIDE 0.9 % IV SOLN: INTRAVENOUS | Status: DC

## 2019-03-04 MED ORDER — MIDAZOLAM HCL 2 MG/2ML IJ SOLN
INTRAMUSCULAR | Status: DC | PRN
  Administered 2019-03-04 (×3): 1 mg via INTRAVENOUS

## 2019-03-04 MED ORDER — NITROGLYCERIN 1 MG/10 ML FOR IR/CATH LAB
INTRA_ARTERIAL | Status: DC | PRN
  Administered 2019-03-04 (×4): 200 ug

## 2019-03-04 MED ORDER — CLOPIDOGREL BISULFATE 75 MG PO TABS: 75.0000 mg | ORAL_TABLET | Freq: Every day | ORAL | 6 refills | Status: DC

## 2019-03-04 MED ORDER — CLOPIDOGREL BISULFATE 300 MG PO TABS
ORAL_TABLET | ORAL | Status: DC | PRN
  Administered 2019-03-04: 300 mg via ORAL

## 2019-03-04 MED ORDER — HEPARIN SODIUM (PORCINE) 1000 UNIT/ML IJ SOLN
INTRAMUSCULAR | Status: DC | PRN
  Administered 2019-03-04: 2000 [IU] via INTRAVENOUS
  Administered 2019-03-04: 8000 [IU] via INTRAVENOUS
  Administered 2019-03-04: 2000 [IU] via INTRAVENOUS

## 2019-03-04 MED ORDER — SODIUM CHLORIDE 0.9% FLUSH: 3.0000 mL | Freq: Two times a day (BID) | INTRAVENOUS | Status: DC

## 2019-03-04 MED ORDER — SODIUM CHLORIDE 0.9 % IV SOLN
INTRAVENOUS | Status: DC
  Administered 2019-03-04: 07:00:00 via INTRAVENOUS

## 2019-03-04 MED ORDER — ASPIRIN 81 MG PO CHEW: 81.0000 mg | CHEWABLE_TABLET | ORAL | Status: DC

## 2019-03-04 MED ORDER — ONDANSETRON HCL 4 MG/2ML IJ SOLN: 4.0000 mg | Freq: Four times a day (QID) | INTRAMUSCULAR | Status: DC | PRN

## 2019-03-04 MED ORDER — LIDOCAINE HCL (PF) 1 % IJ SOLN
INTRAMUSCULAR | Status: AC
  Filled 2019-03-04: qty 30

## 2019-03-04 MED ORDER — HYDRALAZINE HCL 20 MG/ML IJ SOLN: 5.0000 mg | INTRAMUSCULAR | Status: DC | PRN

## 2019-03-04 MED ORDER — CLOPIDOGREL BISULFATE 300 MG PO TABS
ORAL_TABLET | ORAL | Status: AC
  Filled 2019-03-04: qty 1

## 2019-03-04 MED ORDER — FENTANYL CITRATE (PF) 100 MCG/2ML IJ SOLN
INTRAMUSCULAR | Status: AC
  Filled 2019-03-04: qty 2

## 2019-03-04 MED ORDER — IODIXANOL 320 MG/ML IV SOLN
INTRAVENOUS | Status: DC | PRN
  Administered 2019-03-04: 170 mL via INTRAVENOUS

## 2019-03-04 MED ORDER — HEPARIN (PORCINE) IN NACL 1000-0.9 UT/500ML-% IV SOLN
INTRAVENOUS | Status: DC | PRN
  Administered 2019-03-04 (×2): 500 mL

## 2019-03-04 MED ORDER — VERAPAMIL HCL 2.5 MG/ML IV SOLN
INTRAVENOUS | Status: AC
  Filled 2019-03-04: qty 2

## 2019-03-04 MED ORDER — NITROGLYCERIN IN D5W 200-5 MCG/ML-% IV SOLN
INTRAVENOUS | Status: AC
  Filled 2019-03-04: qty 250

## 2019-03-04 MED ORDER — LABETALOL HCL 5 MG/ML IV SOLN: 10.0000 mg | INTRAVENOUS | Status: DC | PRN

## 2019-03-04 SURGICAL SUPPLY — 37 items
BALLN ADMIRAL INPACT 5X200 (BALLOONS) ×4
BALLN COYOTE OTW 4X220X150 (BALLOONS) ×4
BALLN IN.PACT DCB 5X60 (BALLOONS) ×4
BALLOON ADMIRAL INPACT 5X200 (BALLOONS) ×3 IMPLANT
BALLOON COYOTE OTW 4X220X150 (BALLOONS) ×3 IMPLANT
CATH ANGIO 5F PIGTAIL 65CM (CATHETERS) ×4 IMPLANT
CATH CROSS OVER TEMPO 5F (CATHETERS) ×4 IMPLANT
CATH QUICKCROSS .018X135CM (MICROCATHETER) ×4 IMPLANT
CATH STRAIGHT 5FR 65CM (CATHETERS) ×4 IMPLANT
CLOSURE MYNX CONTROL 6F/7F (Vascular Products) ×4 IMPLANT
DCB IN.PACT 5X60 (BALLOONS) ×3 IMPLANT
DEVICE EMBOSHIELD NAV6 4.0-7.0 (FILTER) ×4 IMPLANT
DEVICE TORQUE .014-.018 (MISCELLANEOUS) ×3 IMPLANT
DIAMONDBACK SOLID OAS 1.5MM (CATHETERS) ×4
KIT ENCORE 26 ADVANTAGE (KITS) ×4 IMPLANT
KIT MICROPUNCTURE NIT STIFF (SHEATH) ×4 IMPLANT
KIT PV (KITS) ×4 IMPLANT
LUBRICANT VIPERSLIDE CORONARY (MISCELLANEOUS) ×4 IMPLANT
SHEATH PINNACLE 5F 10CM (SHEATH) ×4 IMPLANT
SHEATH PINNACLE 7F 10CM (SHEATH) ×4 IMPLANT
SHEATH PINNACLE ST 7F 45CM (SHEATH) ×4 IMPLANT
SHEATH PROBE COVER 6X72 (BAG) ×4 IMPLANT
SHIELD RADPAD SCOOP 12X17 (MISCELLANEOUS) ×4 IMPLANT
STOPCOCK MORSE 400PSI 3WAY (MISCELLANEOUS) ×4 IMPLANT
SYR MEDRAD MARK 7 150ML (SYRINGE) ×4 IMPLANT
SYSTEM DIMNDBCK SLD OAS 1.5MM (CATHETERS) ×3 IMPLANT
TAPE VIPERTRACK RADIOPAQ (MISCELLANEOUS) ×3 IMPLANT
TAPE VIPERTRACK RADIOPAQUE (MISCELLANEOUS) ×1
TORQUE DEVICE .014-.018 (MISCELLANEOUS) ×4
TRANSDUCER W/STOPCOCK (MISCELLANEOUS) ×4 IMPLANT
TRAY PV CATH (CUSTOM PROCEDURE TRAY) ×4 IMPLANT
WIRE APROACH 12G .014X300CM (WIRE) ×4 IMPLANT
WIRE ASAHI CONFIANZPRO12 300CM (WIRE) ×4 IMPLANT
WIRE G V18X300CM (WIRE) ×4 IMPLANT
WIRE HITORQ VERSACORE ST 145CM (WIRE) ×4 IMPLANT
WIRE RUNTHROUGH .014X300CM (WIRE) ×4 IMPLANT
WIRE VIPER ADVANCE .017X335CM (WIRE) ×4 IMPLANT

## 2019-03-04 NOTE — Interval H&P Note (Signed)
History and Physical Interval Note:  03/04/2019 8:27 AM  Harold Parker Section  has presented today for surgery, with the diagnosis of peripheral vascular disease.  The various methods of treatment have been discussed with the patient and family. After consideration of risks, benefits and other options for treatment, the patient has consented to  Procedure(s): ABDOMINAL AORTOGRAM W/LOWER EXTREMITY (N/A) as a surgical intervention.  The patient's history has been reviewed, patient examined, no change in status, stable for surgery.  I have reviewed the patient's chart and labs.  Questions were answered to the patient's satisfaction.     [name]

## 2019-03-04 NOTE — Discharge Instructions (Signed)

## 2019-03-05 ENCOUNTER — Telehealth: Payer: Self-pay | Admitting: *Deleted

## 2019-03-05 DIAGNOSIS — I739 Peripheral vascular disease, unspecified: Secondary | ICD-10-CM

## 2019-03-05 MED FILL — Lidocaine HCl Local Preservative Free (PF) Inj 1%: INTRAMUSCULAR | Qty: 30 | Status: AC

## 2019-03-05 NOTE — Telephone Encounter (Signed)
Post procedure LEA and ABI have been ordered. Message sent to scheduling.

## 2019-03-13 ENCOUNTER — Telehealth: Payer: Self-pay | Admitting: Cardiovascular Disease

## 2019-03-13 NOTE — Telephone Encounter (Signed)
Pt states he had angioplasty last week, and has some questions. Pt also asks if he can ride his motorcycle.  Please call to discuss.

## 2019-03-13 NOTE — Telephone Encounter (Signed)
Call returned to the patient. He wanted to know if it was okay to take his bandage off from his angiography. He has been advised to, yes, take this off and inspect the site.  He has been advised that per Dr. Kirke Corin he may ride his motorcycle as well.

## 2019-03-18 ENCOUNTER — Ambulatory Visit (INDEPENDENT_AMBULATORY_CARE_PROVIDER_SITE_OTHER): Payer: Medicare Other

## 2019-03-18 ENCOUNTER — Other Ambulatory Visit: Payer: Self-pay

## 2019-03-18 DIAGNOSIS — I739 Peripheral vascular disease, unspecified: Secondary | ICD-10-CM

## 2019-03-25 ENCOUNTER — Telehealth: Payer: Self-pay | Admitting: *Deleted

## 2019-03-25 DIAGNOSIS — I739 Peripheral vascular disease, unspecified: Secondary | ICD-10-CM

## 2019-03-25 NOTE — Telephone Encounter (Signed)
-----   Message from Iran Ouch, MD sent at 03/23/2019  2:24 PM EDT ----- Improved ABI on the left and stable on the right.  Repeat studies in 6 months.

## 2019-03-25 NOTE — Telephone Encounter (Signed)
Patient made aware of results and verbalized understanding.  Repeat orders have been placed.  

## 2019-03-30 ENCOUNTER — Other Ambulatory Visit: Payer: Self-pay

## 2019-03-30 MED ORDER — FUROSEMIDE 40 MG PO TABS: 40.0000 mg | ORAL_TABLET | Freq: Every day | ORAL | 0 refills | Status: DC

## 2019-03-30 NOTE — Telephone Encounter (Signed)
Requested Prescriptions   Signed Prescriptions Disp Refills  . furosemide (LASIX) 40 MG tablet 90 tablet 0    Sig: Take 1 tablet (40 mg total) by mouth daily.    Authorizing Provider: [name] A    Ordering User: Margrett Rud

## 2019-03-31 ENCOUNTER — Telehealth: Payer: Self-pay | Admitting: *Deleted

## 2019-03-31 NOTE — Telephone Encounter (Signed)
Left a message for the patient to call back to convert his appointment on 04/07/2019 to a web visit.

## 2019-04-01 ENCOUNTER — Telehealth: Payer: Self-pay | Admitting: Cardiovascular Disease

## 2019-04-01 NOTE — Telephone Encounter (Signed)
Virtual Visit Pre-Appointment Phone Call  Steps For Call:  1. Confirm consent - "In the setting of the current Covid19 crisis, you are scheduled for a (phone or video) visit with your provider on (date) at (time).  Just as we do with many in-office visits, in order for you to participate in this visit, we must obtain consent.  If you'd like, I can send this to your mychart (if signed up) or email for you to review.  Otherwise, I can obtain your verbal consent now.  All virtual visits are billed to your insurance company just like a normal visit would be.  By agreeing to a virtual visit, we'd like you to understand that the technology does not allow for your provider to perform an examination, and thus may limit your provider's ability to fully assess your condition.  Finally, though the technology is pretty good, we cannot assure that it will always work on either your or our end, and in the setting of a video visit, we may have to convert it to a phone-only visit.  In either situation, we cannot ensure that we have a secure connection.  Are you willing to proceed?" STAFF: Did the patient verbally acknowledge consent to telehealth visit? Document YES/NO here: yes   2. Confirm the BEST phone number to call the day of the visit by including in appointment notes  3. Give patient instructions for WebEx/MyChart download to smartphone as below or Doximity/Doxy.me if video visit (depending on what platform provider is using)  4. Advise patient to be prepared with their blood pressure, heart rate, weight, any heart rhythm information, their current medicines, and a piece of paper and pen handy for any instructions they may receive the day of their visit  5. Inform patient they will receive a phone call 15 minutes prior to their appointment time (may be from unknown caller ID) so they should be prepared to answer  6. Confirm that appointment type is correct in Epic appointment notes (VIDEO vs  PHONE)     TELEPHONE CALL NOTE  Harold Parker has been deemed a candidate for a follow-up tele-health visit to limit community exposure during the Covid-19 pandemic. I spoke with the patient via phone to ensure availability of phone/video source, confirm preferred email & phone number, and discuss instructions and expectations.  I reminded Harold Parker to be prepared with any vital sign and/or heart rhythm information that could potentially be obtained via home monitoring, at the time of his visit. I reminded Harold Parker to expect a phone call at the time of his visit if his visit.  Harold Parker 04/01/2019 10:24 AM   INSTRUCTIONS FOR DOWNLOADING THE WEBEX APP TO SMARTPHONE  - If Apple, ask patient to go to App Store and type in WebEx in the search bar. Download [employer], the blue/green circle. If Android, go to [employer] and type in [employer] in the search bar. The app is free but as with any other app downloads, their phone may require them to verify saved payment information or Apple/Android password.  - The patient does NOT have to create an account. - On the day of the visit, the assist will walk the patient through joining the meeting with the meeting number/password.  INSTRUCTIONS FOR DOWNLOADING THE MYCHART APP TO SMARTPHONE  - The patient must first make sure to have activated MyChart and know their login information - If Apple, go to [employer] and type in MyChart in the  search bar and download the app. If Android, ask patient to go to [employer] and type in [employer] in the search bar and download the app. The app is free but as with any other app downloads, their phone may require them to verify saved payment information or Apple/Android password.  - The patient will need to then log into the app with their MyChart username and password, and select [facility] as their healthcare provider to link the account. When it is time for your visit, go to the  MyChart app, find appointments, and click Begin Video Visit. Be sure to Select Allow for your device to access the Microphone and Camera for your visit. You will then be connected, and your provider will be with you shortly.  **If they have any issues connecting, or need assistance please contact MyChart service desk [phone])**  **If using a computer, in order to ensure the best quality for their visit they will need to use either of the following Internet Browsers: [employer], or Google Chrome**  IF USING DOXIMITY or DOXY.ME - The patient will receive a link just prior to their visit, either by text or email (to be determined day of appointment depending on if it's doxy.me or Doximity).     FULL LENGTH CONSENT FOR TELE-HEALTH VISIT   I hereby voluntarily request, consent and authorize [facility] and its employed or contracted physicians, physician assistants, nurse practitioners or other licensed health care professionals (the Practitioner), to provide me with telemedicine health care services (the Services") as deemed necessary by the treating Practitioner. I acknowledge and consent to receive the Services by the Practitioner via telemedicine. I understand that the telemedicine visit will involve communicating with the Practitioner through live audiovisual communication technology and the disclosure of certain medical information by electronic transmission. I acknowledge that I have been given the opportunity to request an in-person assessment or other available alternative prior to the telemedicine visit and am voluntarily participating in the telemedicine visit.  I understand that I have the right to withhold or withdraw my consent to the use of telemedicine in the course of my care at any time, without affecting my right to future care or treatment, and that the Practitioner or I may terminate the telemedicine visit at any time. I understand that I have the right to  inspect all information obtained and/or recorded in the course of the telemedicine visit and may receive copies of available information for a reasonable fee.  I understand that some of the potential risks of receiving the Services via telemedicine include:   Delay or interruption in medical evaluation due to technological equipment failure or disruption;  Information transmitted may not be sufficient (e.g. poor resolution of images) to allow for appropriate medical decision making by the Practitioner; and/or   In rare instances, security protocols could fail, causing a breach of personal health information.  Furthermore, I acknowledge that it is my responsibility to provide information about my medical history, conditions and care that is complete and accurate to the best of my ability. I acknowledge that Practitioner's advice, recommendations, and/or decision may be based on factors not within their control, such as incomplete or inaccurate data provided by me or distortions of diagnostic images or specimens that may result from electronic transmissions. I understand that the practice of medicine is not an exact science and that Practitioner makes no warranties or guarantees regarding treatment outcomes. I acknowledge that I will receive a copy of this consent  concurrently upon execution via email to the email address I last provided but may also request a printed copy by calling the office of [facility].    I understand that my insurance will be billed for this visit.   I have read or had this consent read to me.  I understand the contents of this consent, which adequately explains the benefits and risks of the Services being provided via telemedicine.   I have been provided ample opportunity to ask questions regarding this consent and the Services and have had my questions answered to my satisfaction.  I give my informed consent for the services to be provided through the use of  telemedicine in my medical care  By participating in this telemedicine visit I agree to the above.

## 2019-04-01 NOTE — Telephone Encounter (Signed)
Please see the other epic encounter for details. The appointment has been changed to a web visit.

## 2019-04-07 ENCOUNTER — Other Ambulatory Visit: Payer: Self-pay

## 2019-04-07 ENCOUNTER — Encounter: Payer: Self-pay | Admitting: Cardiovascular Disease

## 2019-04-07 ENCOUNTER — Telehealth (INDEPENDENT_AMBULATORY_CARE_PROVIDER_SITE_OTHER): Payer: Medicare Other | Admitting: Cardiovascular Disease

## 2019-04-07 VITALS — BP 114/65 | HR 72 | Temp 96.7°F | Ht 65.0 in | Wt 174.0 lb

## 2019-04-07 DIAGNOSIS — I2510 Atherosclerotic heart disease of native coronary artery without angina pectoris: Secondary | ICD-10-CM | POA: Diagnosis not present

## 2019-04-07 DIAGNOSIS — I739 Peripheral vascular disease, unspecified: Secondary | ICD-10-CM | POA: Diagnosis not present

## 2019-04-07 NOTE — Patient Instructions (Signed)
Medication Instructions:  Continue same medications If you need a refill on your cardiac medications before your next appointment, please call your pharmacy.   Lab work: None If you have labs (blood work) drawn today and your tests are completely normal, you will receive your results only by: . MyChart Message (if you have MyChart) OR . A paper copy in the mail If you have any lab test that is abnormal or we need to change your treatment, we will call you to review the results.  Testing/Procedures: None  Follow-Up: At [facility], you and your health needs are our priority.  As part of our continuing mission to provide you with exceptional heart care, we have created designated Provider Care Teams.  These Care Teams include your primary Cardiologist (physician) and Advanced Practice Providers (APPs -  Physician Assistants and Nurse Practitioners) who all work together to provide you with the care you need, when you need it. You will need a follow up appointment in 4 months.  Please call our office 2 months in advance to schedule this appointment.  You may see Gahel Safley, MD or one of the following Advanced Practice Providers on your designated Care Team:   Christopher Berge, NP Ryan Dunn, PA-C . Jacquelyn Visser, PA-C  

## 2019-04-07 NOTE — Progress Notes (Signed)
Virtual Visit via Video Note   This visit type was conducted due to national recommendations for restrictions regarding the COVID-19 Pandemic (e.g. social distancing) in an effort to limit this patient's exposure and mitigate transmission in our community.  Due to his co-morbid illnesses, this patient is at least at moderate risk for complications without adequate follow up.  This format is felt to be most appropriate for this patient at this time.  All issues noted in this document were discussed and addressed.  A limited physical exam was performed with this format.  Please refer to the patient's chart for his consent to telehealth for [facility].   Evaluation Performed:  Follow-up visit  Date:  04/07/2019   ID:  Harold Parker, DOB 11-23-1946, MRN [id number]  Patient Location: Home Provider Location: Office  PCP:  Merlene Laughter, MD  Cardiologist:  [name], MD  Electrophysiologist:  None   Chief Complaint: Follow-up visit  History of Present Illness:    Harold Parker is a 73 y.o. male was seen today via web visit for a follow-up. He has extensive medical problems including peripheral arterial disease, coronary artery disease and aortic stenosis status post CABG and bioprosthetic aortic valve in March 2019.  Other medical problems include hypertension, hyperlipidemia, type 2 diabetes, COPD and previous tobacco use.   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  He presented in February of 2019 with non-ST elevation myocardial infarction.  Cardiac catheterization revealed severe left main stenosis in addition to significant disease involving OM1 and RCA.  EF was 40 to 45% with moderate aortic stenosis.  The patient underwent CABG and aortic valve replacement in March.  He had postoperative atrial fibrillation that was controlled with amiodarone.  He was discharged home on aspirin and Plavix.  He presented back with an upper GI bleed  with a hemoglobin 4.7.  EGD showed duodenal ulcers.  He was taken off dual antiplatelet therapy and ultimately low-dose aspirin was resumed.  He had an echocardiogram done in April, 2019 which showed an EF of 40 to 45% with normal functioning bioprosthetic aortic valve.  The patient was treated recently for bilateral leg claudication worse on the left side.  Angiography last month showed no significant aortoiliac disease.  On the right, there was long heavily calcified occlusion of the SFA with very well-developed collaterals from the profunda and three-vessel runoff below the knee.  On the left, there was heavily calcified disease affecting the left SFA with three-vessel runoff below the knee.  I performed successful orbital atherectomy and drug-coated balloon angioplasty to the left SFA. He has been doing well and reports significant improvement in left leg claudication.  He denies chest pain or shortness of breath.  He has been taking his medications regularly.   The patient does not have symptoms concerning for COVID-19 infection (fever, chills, cough, or new shortness of breath).    Past Medical History:  Diagnosis Date  . Bilateral carotid bruits    a. 01/2018 U/S: < 50% bilat ICA stenosis.  Marland Kitchen CAD (coronary artery disease)    a. 1998 s/p MI and BMS [facility], [place]); b. 1999 redo PCI/rotablator in setting of what sounds like ISR;  c. Multiple stress tests over the years - last ~ 2017, reportedly nl; d. 01/2018 NSTEMI/Cath: LM 63m/d, LAD 50p, 40p/m, D1 60ost, OM1 95, RCA 100ost/p w/ L->R collats, EF 45%; e. s/p 3V CABG 02/19/18 (LIMA-LAD, VG-D1, VG-OM)  . Chronic lower  back pain   . COPD (chronic obstructive pulmonary disease) (HCC)   . GIB (gastrointestinal bleeding)    a. 02/2018 GIB and anemia w/ Hgb of 4.7 on presentation; b. 03/2018 EGD: 2 nonbleeding duodenal ulcers.  Marland Kitchen HTN (hypertension)   . Hypercholesteremia   . Ischemic cardiomyopathy    a. 01/2018 Echo: EF 40-45%,  mid-apicalanteroseptal, ant, apical sev HK, mod apicalinferior HK. Gr2 DD. Mod AS, mild MR, mod dil LA, PASP .  . Moderate aortic stenosis    a. 01/2018 Echo: Mod AS, mean grad (S) , Valve area (VTI) 1.06 cm^2, (Vmax) 1.27 cm^2; b. 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  . Myocardial infarction (HCC) ~ 1998/1999  . 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 02/19/2018 AVR: 25 mm Edwards Inspiris Resilia stented bovine pericardial tissue valve  . 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 Tobacco abuse   . Type II diabetes mellitus (HCC)    Past Surgical History:  Procedure Laterality Date  . ABDOMINAL AORTOGRAM N/A 03/04/2019   Procedure: ABDOMINAL AORTOGRAM;  Surgeon: Iran Ouch, MD;  Location: [facility];  Service: Cardiovascular;  Laterality: N/A;  . AORTIC VALVE REPLACEMENT N/A 02/19/2018   Procedure: AORTIC VALVE REPLACEMENT (AVR);  Surgeon: Purcell Nails, MD;  Location: [facility] OR;  Service: Open Heart Surgery;  Laterality: N/A;  . COLONOSCOPY    . CORONARY ANGIOPLASTY WITH STENT PLACEMENT  ~ 1998/1999  . CORONARY ARTERY BYPASS GRAFT N/A 02/19/2018   Procedure: CORONARY ARTERY BYPASS GRAFTING (CABG) x three , using left internal mammary artery and right leg greater saphenous vein harvested endoscopically;  Surgeon: Purcell Nails, MD;  Location: [facility] OR;  Service: Open Heart Surgery;  Laterality: N/A;  . ESOPHAGOGASTRODUODENOSCOPY (EGD) WITH PROPOFOL N/A 03/18/2018   Procedure: ESOPHAGOGASTRODUODENOSCOPY (EGD) WITH PROPOFOL;  Surgeon: Midge Minium, MD;  Location: [facility] ENDOSCOPY;  Service: Endoscopy;  Laterality: N/A;  . LEFT HEART CATH AND CORONARY ANGIOGRAPHY N/A 02/06/2018   Procedure: LEFT HEART CATH AND CORONARY ANGIOGRAPHY;  Surgeon: Iran Ouch, MD;  Location: [facility] INVASIVE CV LAB;  Service: Cardiovascular;  Laterality: N/A;  . LOWER EXTREMITY ANGIOGRAPHY Bilateral 03/04/2019   Procedure: Lower  Extremity Angiography;  Surgeon: Iran Ouch, MD;  Location: [facility];  Service: Cardiovascular;  Laterality: Bilateral;  . PERIPHERAL VASCULAR ATHERECTOMY Left 03/04/2019   Procedure: PERIPHERAL VASCULAR ATHERECTOMY;  Surgeon: Iran Ouch, MD;  Location: [facility];  Service: Cardiovascular;  Laterality: Left;  SFA  . TEE WITHOUT CARDIOVERSION N/A 02/19/2018   Procedure: TRANSESOPHAGEAL ECHOCARDIOGRAM (TEE);  Surgeon: Purcell Nails, MD;  Location: [facility] OR;  Service: Open Heart Surgery;  Laterality: N/A;  . TONSILLECTOMY       Current Meds  Medication Sig  . acetaminophen (TYLENOL) 500 MG tablet Take 1,000 mg by mouth daily as needed for moderate pain or headache.  . ALPRAZolam (XANAX) 0.5 MG tablet Take 0.5 mg by mouth 2 (two) times daily.   . Ascorbic Acid (VITAMIN C) 1000 MG tablet Take 1,000 mg by mouth daily.  Marland Kitchen aspirin EC 81 MG tablet Take 81 mg by mouth daily.  . clopidogrel (PLAVIX) 75 MG tablet Take 1 tablet (75 mg total) by mouth daily.  Marland Kitchen esomeprazole (NEXIUM) 40 MG capsule Take 40 mg by mouth at bedtime.   Marland Kitchen ezetimibe (ZETIA) 10 MG tablet Take 10 mg by mouth at bedtime.   Marland Kitchen  folic acid (FOLVITE) 400 MCG tablet Take 400 mcg by mouth every evening.  . furosemide (LASIX) 40 MG tablet Take 1 tablet (40 mg total) by mouth daily.  Marland Kitchen glipiZIDE (GLUCOTROL) 5 MG tablet Take 0.5 tablets (2.5 mg total) by mouth daily before breakfast. (Patient taking differently: Take 5 mg by mouth See admin instructions. Take 5 mg in the morning and take a second 5 mg dose at night on Mon, Wed, and Fri)  . ipratropium-albuterol (DUONEB) 0.5-2.5 (3) MG/3ML SOLN Take 3 mLs by nebulization every 6 (six) hours as needed. (Patient taking differently: Take 3 mLs by nebulization every 6 (six) hours as needed (shortness of breath). )  . losartan (COZAAR) 100 MG tablet TAKE 1 TABLET(100 MG) BY MOUTH DAILY (Patient taking differently: Take 100 mg by mouth daily. )  . Melatonin 10 MG CAPS Take  10 mg by mouth at bedtime.  . metFORMIN (GLUCOPHAGE) 1000 MG tablet TAKE 1 TABLET BY MOUTH TWICE DAILY WITH A MEAL (Patient taking differently: Take 1,000 mg by mouth 2 (two) times daily. )  . metoprolol succinate (TOPROL-XL) 100 MG 24 hr tablet Take 1 tablet (100 mg total) by mouth daily. Take with or immediately following a meal.  . PROAIR HFA 108 (90 Base) MCG/ACT inhaler INL 2 PFS PO Q 6 H PRN  . TRELEGY ELLIPTA 100-62.5-25 MCG/INH AEPB INHALE 1 PUFF PO INTO THE LUNGS D  . Vitamin D, Cholecalciferol, 1000 units TABS Take 1,000 Units by mouth every morning.      Allergies:   Patient has no known allergies.   Social History   Tobacco Use  . Smoking status: Former Smoker    Packs/day: 1.00    Years: 15.00    Pack years: 15.00    Types: Cigarettes    Last attempt to quit: 02/02/2018    Years since quitting: 1.1  . Smokeless tobacco: Never Used  . Tobacco comment: Quit 01/2018 - on 05/01/2018 talked to Chan about Relaspe concerns. He ststaes that he has no taste for tobacco since he quit in Feb.  Substance Use Topics  . Alcohol use: Yes    Frequency: Never    Comment: occ  . Drug use: No     Family Hx: The patient's family history includes Lymphoma in his mother; Peripheral vascular disease in his father.  ROS:   Please see the history of present illness.     All other systems reviewed and are negative.   Prior CV studies:   The following studies were reviewed today:  I reviewed lower extremity arterial vascular studies with him which showed significant improvement in ABI on the left side  Labs/Other Tests and Data Reviewed:    EKG:  No ECG reviewed.  Recent Labs: 02/04/2019: BUN 25; Creatinine, Ser 0.92; Potassium 4.6; Sodium 143   Recent Lipid Panel Lab Results  Component Value Date/Time   CHOL 102 03/27/2018 09:00 AM   TRIG 84 03/27/2018 09:00 AM   HDL 35 (L) 03/27/2018 09:00 AM   CHOLHDL 2.9 03/27/2018 09:00 AM   CHOLHDL 3.3 02/06/2018 02:44 AM   LDLCALC 50  03/27/2018 09:00 AM    Wt Readings from Last 3 Encounters:  04/07/19 174 lb (78.9 kg)  03/04/19 175 lb (79.4 kg)  03/02/19 179 lb (81.2 kg)     Objective:    Vital Signs:  BP 114/65   Pulse 72   Temp (!) 96.7 F (35.9 C)   Ht 5\' 5"  (1.651 m)   Wt 174 lb (  78.9 kg)   SpO2 92%   BMI 28.96 kg/m    VITAL SIGNS:  reviewed GEN:  no acute distress EYES:  sclerae anicteric, EOMI - Extraocular Movements Intact RESPIRATORY:  normal respiratory effort, symmetric expansion NEURO:  alert and oriented x 3, no obvious focal deficit PSYCH:  normal affect  ASSESSMENT & PLAN:    1.  Peripheral arterial disease: Status post successful revascularization of the left SFA with improvement in claudication and ABI.  He has chronically occluded right SFA with very well-developed collaterals for which I recommend continued medical therapy.  He is currently on dual antiplatelet therapy but the plan is to discontinue clopidogrel in few months given history of prior GI bleed.    2. Coronary artery disease involving native coronary arteries without angina: Status post CABG in March 2019.  He is doing better overall.  3.  Status post bioprosthetic aortic valve replacement for aortic stenosis: Most recent echo showed normal functioning bioprosthetic aortic valve.  4.  Chronic systolic heart failure with mildly reduced LV systolic function.  Echocardiogram in February showed an EF of 40 to 45% with moderate to severe pulmonary hypertension.    Continue treatment with Toprol and losartan.    He seems to be euvolemic on current dose of furosemide.  5.  Hyperlipidemia: Currently on  Zetia.  Atorvastatin was discontinued due to leg cramps.    I will plan on repeat lipid profile in few months and consider adding small dose rosuvastatin based on the results.   COVID-19 Education: The signs and symptoms of COVID-19 were discussed with the patient and how to seek care for testing (follow up with PCP or arrange  E-visit).  The importance of social distancing was discussed today.  Time:   Today, I have spent 25 minutes with the patient with telehealth technology discussing the above problems.     Medication Adjustments/Labs and Tests Ordered: Current medicines are reviewed at length with the patient today.  Concerns regarding medicines are outlined above.   Tests Ordered: No orders of the defined types were placed in this encounter.   Medication Changes: No orders of the defined types were placed in this encounter.   Disposition:  Follow up in 4 month(s)  Signed, [name], MD  04/07/2019 3:48 PM    [facility]

## 2019-04-14 ENCOUNTER — Other Ambulatory Visit: Payer: Self-pay | Admitting: Cardiovascular Disease

## 2019-04-23 ENCOUNTER — Ambulatory Visit: Payer: Medicare Other | Admitting: Cardiovascular Disease

## 2019-05-12 ENCOUNTER — Other Ambulatory Visit: Payer: Self-pay | Admitting: Cardiovascular Disease

## 2019-06-05 DIAGNOSIS — H2511 Age-related nuclear cataract, right eye: Secondary | ICD-10-CM | POA: Diagnosis not present

## 2019-06-29 ENCOUNTER — Other Ambulatory Visit: Payer: Self-pay | Admitting: Cardiovascular Disease

## 2019-07-06 IMAGING — CT CT CHEST LUNG CANCER SCREENING LOW DOSE
2 of 5 series · 14 of 40 positions shown, 17 images · non-contrast
Comparison: Cardiac CT 02/11/2018.

CLINICAL DATA: 72-year-old male former smoker (quit in 6621) with
40 pack-year history of smoking. Lung cancer screening examination.

EXAM:
CT CHEST WITHOUT CONTRAST LOW-DOSE FOR LUNG CANCER SCREENING
TECHNIQUE: Multidetector CT imaging of the chest was performed following the
standard protocol without IV contrast.

[Series 4: lung 1.00 br44 cor · coronal · 0.66mm/px · 3 of 355 slices shown]
[im 71/355  lung]
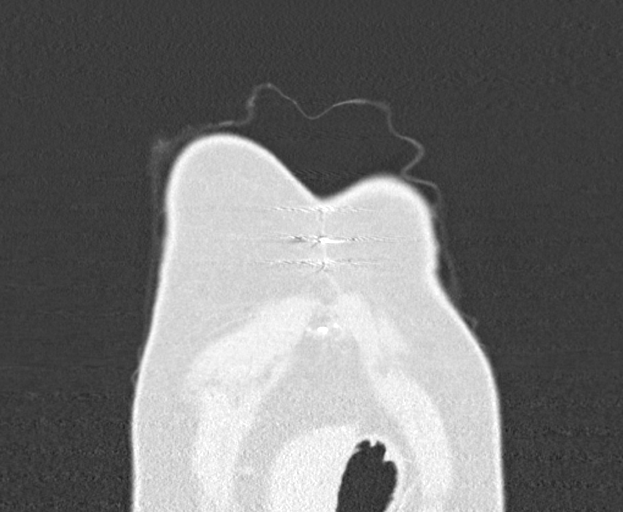
[im 142/355  lung]
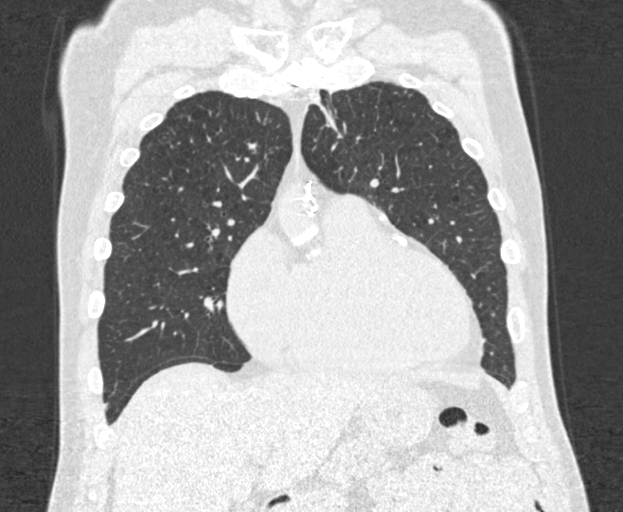
[im 213/355  lung]
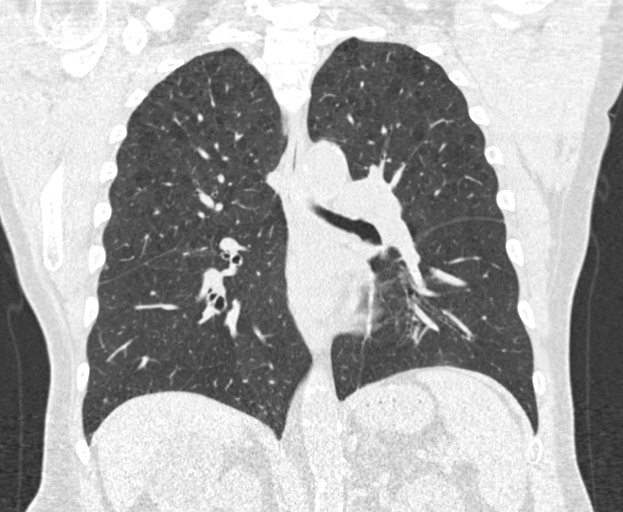

[Series 9: lung 1.00 br60 · axial · 0.80mm/px · z∈[-937,-632]mm · 11 of 337 slices shown, 14 images]
[im 16/337  mediastinal]
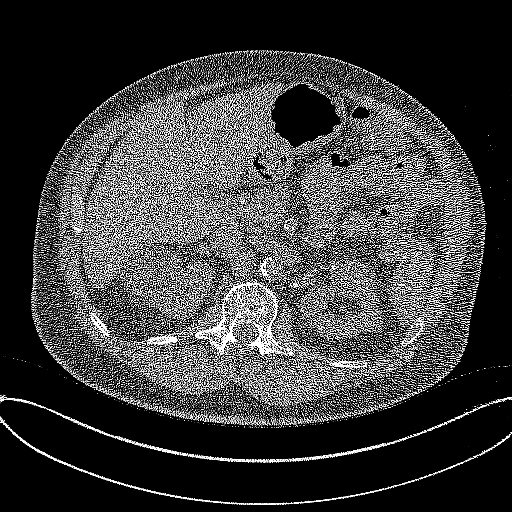
[im 16/337  lung]
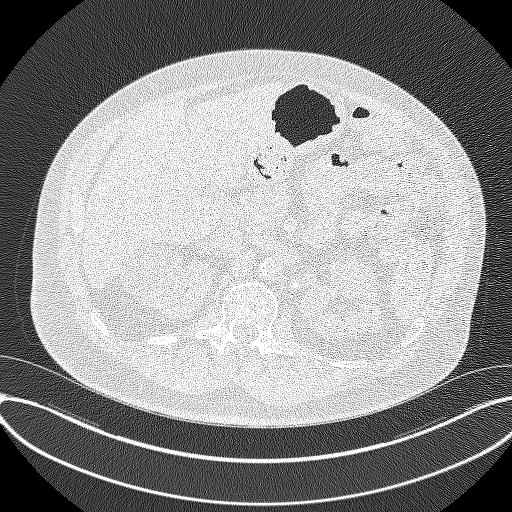
[im 46/337  lung]
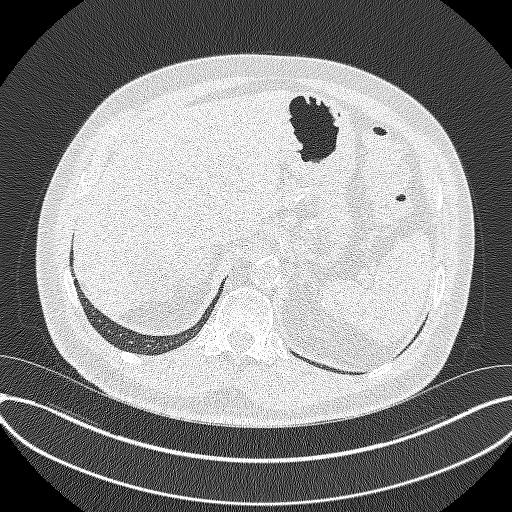
[im 77/337  lung]
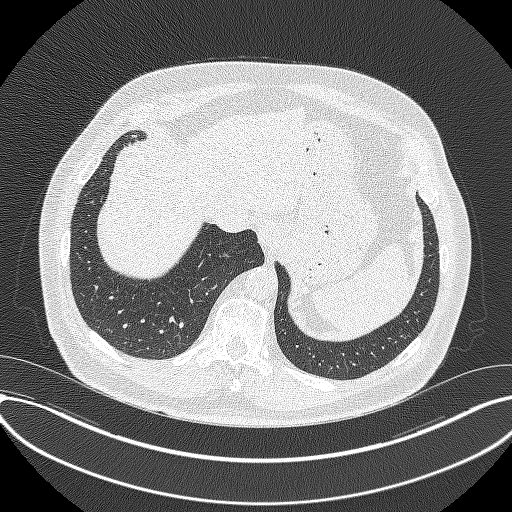
[im 107/337  lung]
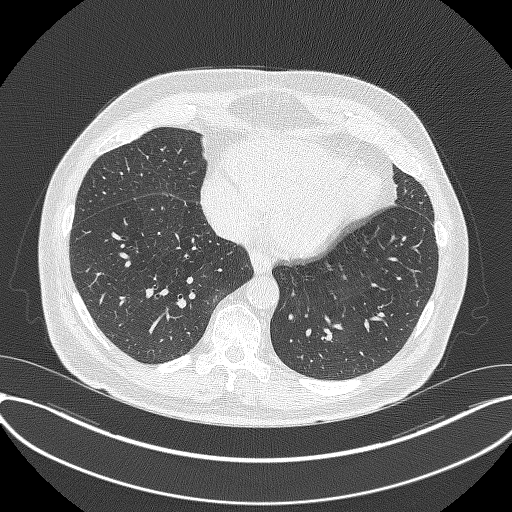
[im 138/337  mediastinal]
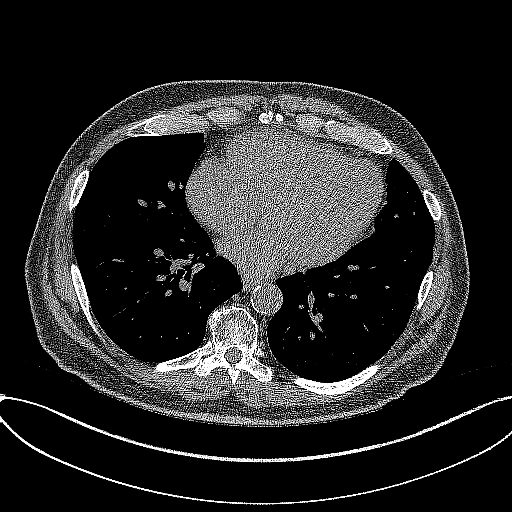
[im 138/337  lung]
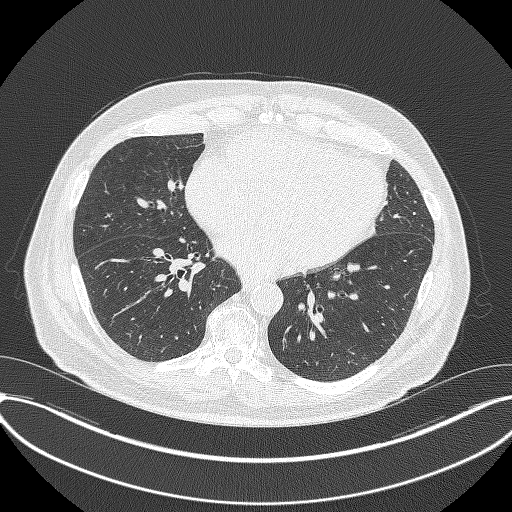
[im 169/337  lung]
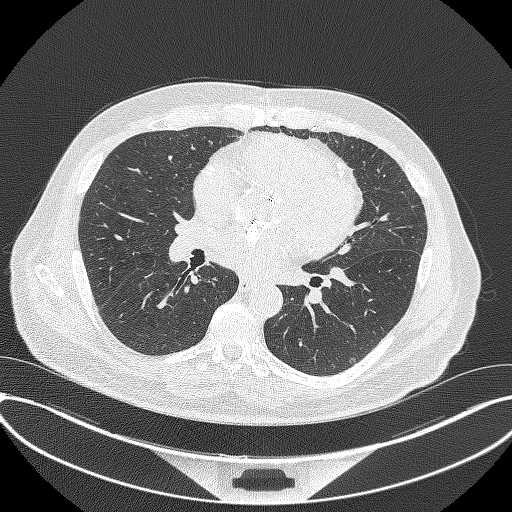
[im 199/337  lung]
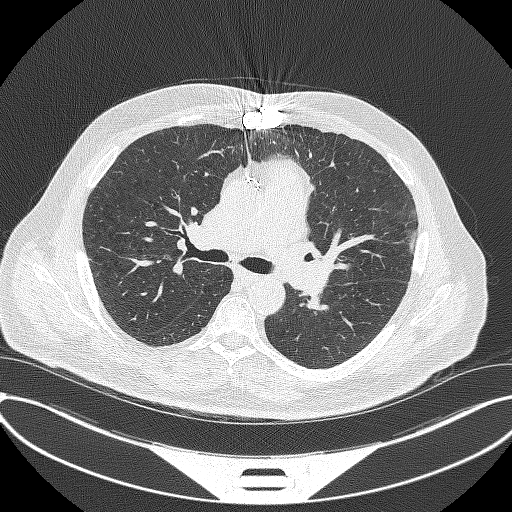
[im 230/337  lung]
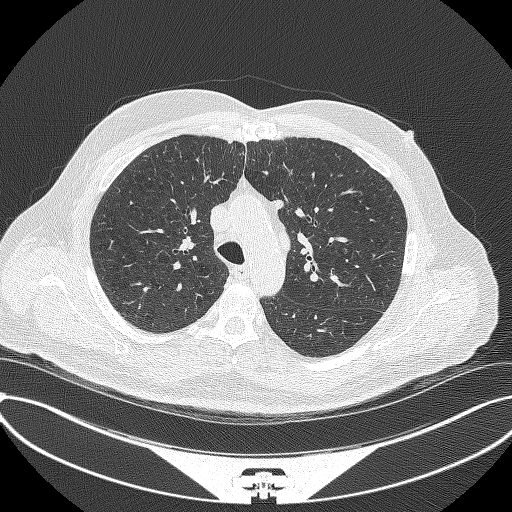
[im 260/337  mediastinal]
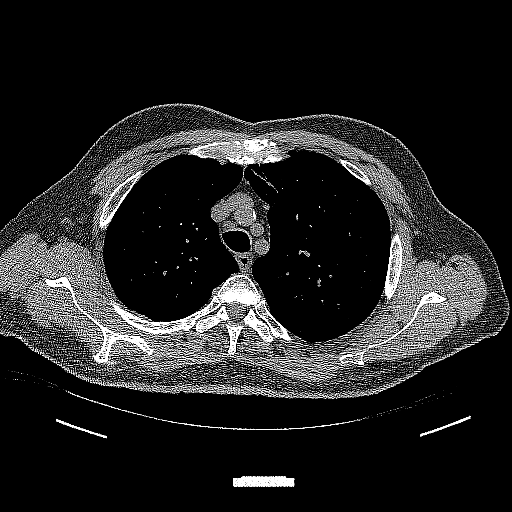
[im 260/337  lung]
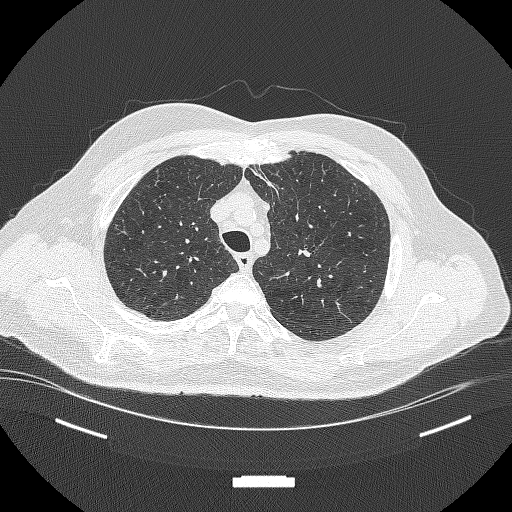
[im 291/337  lung]
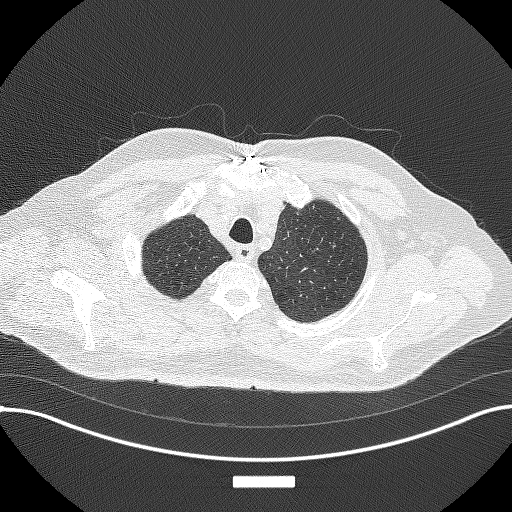
[im 321/337  lung]
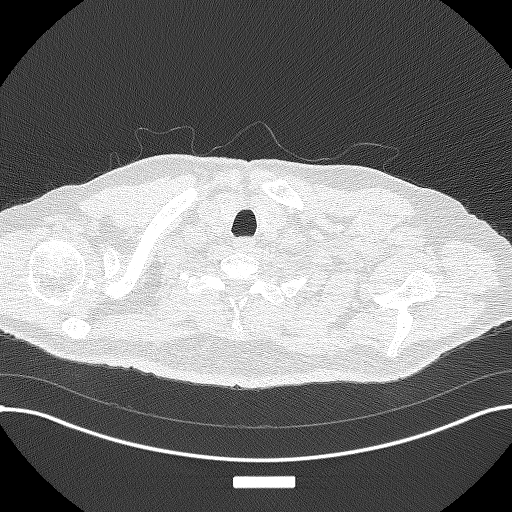

[14 of 40 positions shown; findings below may reference images not displayed]

FINDINGS: Cardiovascular: Heart size is normal. There is no significant
pericardial fluid, thickening or pericardial calcification. There is
aortic atherosclerosis, as well as atherosclerosis of the great
vessels of the mediastinum and the coronary arteries, including
calcified atherosclerotic plaque in the left main, left anterior
descending, left circumflex and right coronary arteries. Status post
median sternotomy for CABG including [REDACTED] to the LAD. The patient is
also status post aortic valve replacement.

Mediastinum/Nodes: No pathologically enlarged mediastinal or hilar
lymph nodes. Please note that accurate exclusion of hilar adenopathy
is limited on noncontrast CT scans. Esophagus is unremarkable in
appearance. No axillary lymphadenopathy.

Lungs/Pleura: Multiple small pulmonary nodules are noted throughout
the lungs bilaterally, largest of which is in the central aspect of
the left upper lobe (axial image 154 of series 3), with a volume
derived mean diameter 4.0 mm. No larger more suspicious appearing
pulmonary nodules or masses are noted. No acute consolidative
airspace disease. No pleural effusions. Diffuse bronchial wall
thickening with mild centrilobular and paraseptal emphysema.

Upper Abdomen: Exophytic low-attenuation lesion in the upper pole of
the right kidney, incompletely characterized on today's noncontrast
CT examination, but statistically likely to represent a cyst. A
smaller low-attenuation lesion is also noted in the upper pole of
the left kidney. Aortic atherosclerosis.

Musculoskeletal: Median sternotomy wires. There are no aggressive
appearing lytic or blastic lesions noted in the visualized portions
of the skeleton.
IMPRESSION: 1. Lung-RADS 2, benign appearance or behavior. Continue annual
screening with low-dose chest CT without contrast in 12 months.
2. Aortic atherosclerosis, in addition to left main and 3 vessel
coronary artery disease. Status post median sternotomy for CABG
including [REDACTED] to the LAD.
3. Diffuse bronchial wall thickening with mild centrilobular and
paraseptal emphysema; imaging findings suggestive of underlying
COPD.
4. Additional incidental findings, as above.

Aortic Atherosclerosis (QQ838-49I.I) and Emphysema ([id number].X).

## 2019-07-08 ENCOUNTER — Other Ambulatory Visit: Payer: Self-pay | Admitting: Cardiovascular Disease

## 2019-07-27 ENCOUNTER — Other Ambulatory Visit: Payer: Self-pay | Admitting: Cardiovascular Disease

## 2019-07-27 ENCOUNTER — Telehealth: Payer: Self-pay | Admitting: Dermatopathology

## 2019-07-27 NOTE — Telephone Encounter (Signed)
LVM for patient to call and schedule 4 month followup with Dr. Fletcher Anon.

## 2019-07-27 NOTE — Telephone Encounter (Signed)
Pt due for 4 month f/u.  Please contact pt for future appointment.

## 2019-08-21 DIAGNOSIS — I129 Hypertensive chronic kidney disease with stage 1 through stage 4 chronic kidney disease, or unspecified chronic kidney disease: Secondary | ICD-10-CM | POA: Diagnosis not present

## 2019-08-21 DIAGNOSIS — N183 Chronic kidney disease, stage 3 (moderate): Secondary | ICD-10-CM | POA: Diagnosis not present

## 2019-08-21 DIAGNOSIS — I739 Peripheral vascular disease, unspecified: Secondary | ICD-10-CM | POA: Diagnosis not present

## 2019-08-21 DIAGNOSIS — Z23 Encounter for immunization: Secondary | ICD-10-CM | POA: Diagnosis not present

## 2019-08-21 DIAGNOSIS — Z79899 Other long term (current) drug therapy: Secondary | ICD-10-CM | POA: Diagnosis not present

## 2019-08-21 DIAGNOSIS — J449 Chronic obstructive pulmonary disease, unspecified: Secondary | ICD-10-CM | POA: Diagnosis not present

## 2019-08-21 DIAGNOSIS — E7800 Pure hypercholesterolemia, unspecified: Secondary | ICD-10-CM | POA: Diagnosis not present

## 2019-08-21 DIAGNOSIS — E1151 Type 2 diabetes mellitus with diabetic peripheral angiopathy without gangrene: Secondary | ICD-10-CM | POA: Diagnosis not present

## 2019-08-21 DIAGNOSIS — E1121 Type 2 diabetes mellitus with diabetic nephropathy: Secondary | ICD-10-CM | POA: Diagnosis not present

## 2019-08-25 ENCOUNTER — Encounter: Payer: Self-pay | Admitting: Cardiovascular Disease

## 2019-08-25 ENCOUNTER — Other Ambulatory Visit: Payer: Self-pay

## 2019-08-25 ENCOUNTER — Ambulatory Visit (INDEPENDENT_AMBULATORY_CARE_PROVIDER_SITE_OTHER): Payer: Medicare Other | Admitting: Cardiovascular Disease

## 2019-08-25 VITALS — BP 138/60 | HR 71 | Ht 66.0 in | Wt 183.0 lb

## 2019-08-25 DIAGNOSIS — I739 Peripheral vascular disease, unspecified: Secondary | ICD-10-CM

## 2019-08-25 DIAGNOSIS — E785 Hyperlipidemia, unspecified: Secondary | ICD-10-CM | POA: Diagnosis not present

## 2019-08-25 DIAGNOSIS — I5022 Chronic systolic (congestive) heart failure: Secondary | ICD-10-CM | POA: Diagnosis not present

## 2019-08-25 DIAGNOSIS — I359 Nonrheumatic aortic valve disorder, unspecified: Secondary | ICD-10-CM

## 2019-08-25 DIAGNOSIS — I2510 Atherosclerotic heart disease of native coronary artery without angina pectoris: Secondary | ICD-10-CM | POA: Diagnosis not present

## 2019-08-25 NOTE — Patient Instructions (Signed)
Medication Instructions:  Your physician has recommended you make the following change in your medication:   STOP Plavix  If you need a refill on your cardiac medications before your next appointment, please call your pharmacy.   Lab work: None ordered If you have labs (blood work) drawn today and your tests are completely normal, you will receive your results only by: . MyChart Message (if you have MyChart) OR . A paper copy in the mail If you have any lab test that is abnormal or we need to change your treatment, we will call you to review the results.  Testing/Procedures: None ordered  Follow-Up: At [facility], you and your health needs are our priority.  As part of our continuing mission to provide you with exceptional heart care, we have created designated Provider Care Teams.  These Care Teams include your primary Cardiologist (physician) and Advanced Practice Providers (APPs -  Physician Assistants and Nurse Practitioners) who all work together to provide you with the care you need, when you need it. You will need a follow up appointment in 6 months.  Please call our office 2 months in advance to schedule this appointment.  You may see Muhammad Arida, MD or one of the following Advanced Practice Providers on your designated Care Team:   Christopher Berge, NP Ryan Dunn, PA-C . Jacquelyn Visser, PA-C  Any Other Special Instructions Will Be Listed Below (If Applicable). N/A   

## 2019-08-25 NOTE — Progress Notes (Signed)
Cardiology Office Note   Date:  08/25/2019   ID:  Harold Parker, DOB [birth year], MRN [id number]  PCP:  Merlene LaughterStoneking, Hal, MD  Cardiologist:   Lorine BearsMuhammad Terrionna Bridwell, MD  Chief Complaint  Patient presents with  . other    4 month f/u c/o sob and severe leg pain. Meds reviewed verbally with pt.      History of Present Illness: Harold Parker is a 73 y.o. male who presents for a follow-up visit regarding peripheral arterial disease, coronary artery disease and aortic stenosis status post CABG and bioprosthetic aortic valve in March 2019.    He has extensive medical problems including peripheral arterial disease,coronary artery disease and aortic stenosis status post CABG and bioprosthetic aortic valve in March 2019. Other medical problems include hypertension, hyperlipidemia, type 2 diabetes, COPD and previous tobacco use. 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  He presented in February of 2019 with non-ST elevation myocardial infarction. Cardiac catheterization revealed severe left main stenosis in addition to significant disease involving OM1 and RCA. EF was 40 to 45% with moderate aortic stenosis. The patient underwent CABG and aortic valve replacement in March. He had postoperative atrial fibrillation that was controlled with amiodarone. He was discharged home on aspirin and Plavix. He presented back with an upper GI bleed with a hemoglobin 4.7. EGD showed duodenal ulcers. He was taken off dual antiplatelet therapy and ultimately low-dose aspirin was resumed.  He had an echocardiogram done in April, 2019 which showed an EF of 40 to 45% with normal functioning bioprosthetic aortic valve.  The patient is known to have bilateral leg claudication.  He underwent angiography in March of this year which showed no significant aortoiliac disease.  On the right, there was long heavily calcified occlusion of the SFA with very well-developed  collaterals from the profunda and three-vessel runoff below the knee.  On the left, there was heavily calcified disease affecting the left SFA with three-vessel runoff below the knee.  I performed successful orbital atherectomy and drug-coated balloon angioplasty to the left SFA. Postprocedure ABI improved to 0.75 on the left.  Right ABI was stable at 0.66. He has been doing well with no recent chest pain.  He has chronic exertional dyspnea likely related to underlying COPD.  He continues to have cramps in both thighs, hands and neck.  He is not on a statin drug due to previous lower extremity cramping.   Past Medical History:  Diagnosis Date  . Bilateral carotid bruits    a. 01/2018 U/S: < 50% bilat ICA stenosis.  Marland Kitchen. CAD (coronary artery disease)    a. 1998 s/p MI and BMS [facility]([place], [place]); b. 1999 redo PCI/rotablator in setting of what sounds like ISR;  c. Multiple stress tests over the years - last ~ 2017, reportedly nl; d. 01/2018 NSTEMI/Cath: LM 50102m/d, LAD 50p, 40p/m, D1 60ost, OM1 95, RCA 100ost/p w/ L->R collats, EF 45%; e. s/p 3V CABG 02/19/18 (LIMA-LAD, VG-D1, VG-OM)  . Chronic lower back pain   . COPD (chronic obstructive pulmonary disease) (HCC)   . GIB (gastrointestinal bleeding)    a. 02/2018 GIB and anemia w/ Hgb of 4.7 on presentation; b. 03/2018 EGD: 2 nonbleeding duodenal ulcers.  Marland Kitchen. HTN (hypertension)   . Hypercholesteremia   . Ischemic cardiomyopathy    a. 01/2018 Echo: EF 40-45%, mid-apicalanteroseptal, ant, apical sev HK, mod apicalinferior HK. Gr2 DD. Mod AS, mild MR, mod dil LA, PASP 50mmHg.  .Marland Kitchen  Moderate aortic stenosis    a. 01/2018 Echo: Mod AS, mean grad (S) , Valve area (VTI) 1.06 cm^2, (Vmax) 1.27 cm^2; b. 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  . Myocardial infarction (HCC) ~ 1998/1999  . 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 02/19/2018 AVR: 25 mm Edwards Inspiris Resilia stented bovine pericardial tissue valve  . 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   LIMA to  LAD, SVG to D1, SVG to OM, EVH via right thigh and leg  . Tobacco abuse   . Type II diabetes mellitus (HCC)     Past Surgical History:  Procedure Laterality Date  . ABDOMINAL AORTOGRAM N/A 03/04/2019   Procedure: ABDOMINAL AORTOGRAM;  Surgeon: Iran Ouch, MD;  Location: [facility];  Service: Cardiovascular;  Laterality: N/A;  . AORTIC VALVE REPLACEMENT N/A 02/19/2018   Procedure: AORTIC VALVE REPLACEMENT (AVR);  Surgeon: Purcell Nails, MD;  Location: [facility] OR;  Service: Open Heart Surgery;  Laterality: N/A;  . COLONOSCOPY    . CORONARY ANGIOPLASTY WITH STENT PLACEMENT  ~ 1998/1999  . CORONARY ARTERY BYPASS GRAFT N/A 02/19/2018   Procedure: CORONARY ARTERY BYPASS GRAFTING (CABG) x three , using left internal mammary artery and right leg greater saphenous vein harvested endoscopically;  Surgeon: Purcell Nails, MD;  Location: [facility] OR;  Service: Open Heart Surgery;  Laterality: N/A;  . ESOPHAGOGASTRODUODENOSCOPY (EGD) WITH PROPOFOL N/A 03/18/2018   Procedure: ESOPHAGOGASTRODUODENOSCOPY (EGD) WITH PROPOFOL;  Surgeon: Midge Minium, MD;  Location: [facility] ENDOSCOPY;  Service: Endoscopy;  Laterality: N/A;  . LEFT HEART CATH AND CORONARY ANGIOGRAPHY N/A 02/06/2018   Procedure: LEFT HEART CATH AND CORONARY ANGIOGRAPHY;  Surgeon: Iran Ouch, MD;  Location: [facility] INVASIVE CV LAB;  Service: Cardiovascular;  Laterality: N/A;  . LOWER EXTREMITY ANGIOGRAPHY Bilateral 03/04/2019   Procedure: Lower Extremity Angiography;  Surgeon: Iran Ouch, MD;  Location: [facility];  Service: Cardiovascular;  Laterality: Bilateral;  . PERIPHERAL VASCULAR ATHERECTOMY Left 03/04/2019   Procedure: PERIPHERAL VASCULAR ATHERECTOMY;  Surgeon: Iran Ouch, MD;  Location: [facility];  Service: Cardiovascular;  Laterality: Left;  SFA  . TEE WITHOUT CARDIOVERSION N/A 02/19/2018   Procedure: TRANSESOPHAGEAL ECHOCARDIOGRAM (TEE);  Surgeon: Purcell Nails, MD;  Location: [facility] OR;  Service: Open Heart  Surgery;  Laterality: N/A;  . TONSILLECTOMY       Current Outpatient Medications  Medication Sig Dispense Refill  . acetaminophen (TYLENOL) 500 MG tablet Take 1,000 mg by mouth daily as needed for moderate pain or headache.    . ALPRAZolam (XANAX) 0.5 MG tablet Take 0.5 mg by mouth 2 (two) times daily.     Marland Kitchen amLODipine (NORVASC) 5 MG tablet Take 5 mg by mouth daily.    . Ascorbic Acid (VITAMIN C) 1000 MG tablet Take 1,000 mg by mouth daily.    Marland Kitchen aspirin EC 81 MG tablet Take 81 mg by mouth daily.    . clopidogrel (PLAVIX) 75 MG tablet Take 1 tablet (75 mg total) by mouth daily. 30 tablet 6  . esomeprazole (NEXIUM) 40 MG capsule Take 40 mg by mouth at bedtime.     Marland Kitchen ezetimibe (ZETIA) 10 MG tablet Take 10 mg by mouth at bedtime.     . folic acid (FOLVITE) 400 MCG tablet Take 400 mcg by mouth every evening.    . furosemide (LASIX) 40 MG tablet TAKE 1 TABLET(40 MG) BY MOUTH DAILY 90 tablet 0  . glipiZIDE (GLUCOTROL) 5 MG tablet Take 0.5 tablets (2.5  mg total) by mouth daily before breakfast. (Patient taking differently: Take 5 mg by mouth See admin instructions. Take 5 mg in the morning and take a second 5 mg dose at night on Mon, Wed, and Fri) 30 tablet 1  . ipratropium-albuterol (DUONEB) 0.5-2.5 (3) MG/3ML SOLN Take 3 mLs by nebulization every 6 (six) hours as needed. (Patient taking differently: Take 3 mLs by nebulization every 6 (six) hours as needed (shortness of breath). ) 360 mL 1  . losartan (COZAAR) 100 MG tablet TAKE 1 TABLET(100 MG) BY MOUTH DAILY 90 tablet 0  . Melatonin 10 MG CAPS Take 10 mg by mouth at bedtime.    . metFORMIN (GLUCOPHAGE) 1000 MG tablet TAKE 1 TABLET BY MOUTH TWICE DAILY WITH A MEAL (Patient taking differently: Take 1,000 mg by mouth 2 (two) times daily. ) 60 tablet 0  . metoprolol succinate (TOPROL-XL) 100 MG 24 hr tablet TAKE 1 TABLET BY MOUTH EVERY DAY WITH OR IMMEDIATELY FOLLOWING A MEAL 90 tablet 0  . PROAIR HFA 108 (90 Base) MCG/ACT inhaler INL 2 PFS PO Q 6 H  PRN    . TRELEGY ELLIPTA 100-62.5-25 MCG/INH AEPB INHALE 1 PUFF PO INTO THE LUNGS D    . Vitamin D, Cholecalciferol, 1000 units TABS Take 1,000 Units by mouth every morning.      No current facility-administered medications for this visit.     Allergies:   Patient has no known allergies.    Social History:  The patient  reports that he quit smoking about 18 months ago. His smoking use included cigarettes. He has a 15.00 pack-year smoking history. He has never used smokeless tobacco. He reports current alcohol use. He reports that he does not use drugs.   Family History:  The patient's family history includes Lymphoma in his mother; Peripheral vascular disease in his father.    ROS:  Please see the history of present illness.   Otherwise, review of systems are positive for none.   All other systems are reviewed and negative.    PHYSICAL EXAM: VS:  BP 138/60 (BP Location: Left Arm, Patient Position: Sitting, Cuff Size: Normal)   Pulse 71   Ht 5\' 6"  (1.676 m)   Wt 183 lb (83 kg)   BMI 29.54 kg/m  , BMI Body mass index is 29.54 kg/m. GEN: Well nourished, well developed, in no acute distress  HEENT: normal  Neck: no JVD, carotid bruits, or masses Cardiac: RRR; no  rubs, or gallops,no edema, no murmur. Respiratory:  clear to auscultation bilaterally, normal work of breathing GI: soft, nontender, nondistended, + BS MS: no deformity or atrophy  Skin: warm and dry, no rash Neuro:  Strength and sensation are intact Psych: euthymic mood, full affect    EKG:  EKG is ordered today. 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  Recent Labs: 02/04/2019: BUN 25; Creatinine, Ser 0.92; Potassium 4.6; Sodium 143    Lipid Panel    Component Value Date/Time   CHOL 102 03/27/2018 0900   TRIG 84 03/27/2018 0900   HDL 35 (L) 03/27/2018 0900   CHOLHDL 2.9 03/27/2018 0900   CHOLHDL 3.3 02/06/2018 0244   VLDL 23  02/06/2018 0244   LDLCALC 50 03/27/2018 0900      Wt Readings from Last 3 Encounters:  08/25/19 183 lb (83 kg)  04/07/19 174 lb (78.9 kg)  03/04/19 175 lb (79.4 kg)       No flowsheet  data found.    ASSESSMENT AND PLAN:  1. Peripheral arterial disease:  Status post atherectomy and drug-coated balloon angioplasty of the left SFA.  Currently with no significant calf claudication.  I elected to discontinue clopidogrel given previous GI bleed.  Continue aspirin indefinitely.  Repeat vascular studies next month.  2. Coronary artery disease involving native coronary arterieswithout angina:Status post CABG in March 2019. He is doing better overall.  3. Status post bioprosthetic aortic valve replacement for aortic stenosis: Most recent echo showed normal functioning bioprosthetic aortic valve.  4. Chronic systolic heart failure with mildly reduced LV systolic function. Echocardiogram in February showed an EF of 40 to 45% with moderate to severe pulmonary hypertension.  Continue treatment with Toprol and losartan.   He seems to be euvolemic on current dose of furosemide.  5. Hyperlipidemia: Currently on Zetia. Atorvastatin was discontinued due to leg cramps.     He continues to complain of generalized cramps.  I will consider adding small dose rosuvastatin in the future once I review his lipid profile.  He gets his labs done with his primary care physician.   Disposition:   FU with me in 6 month   Signed, Kathlyn Sacramento, MD 08/25/19 [place], [place]

## 2019-08-26 NOTE — Addendum Note (Signed)
Addended by: Britt Bottom on: 08/26/2019 09:24 AM   Modules accepted: Orders

## 2019-09-25 ENCOUNTER — Telehealth: Payer: Self-pay

## 2019-09-25 MED ORDER — METOPROLOL SUCCINATE ER 100 MG PO TB24: ORAL_TABLET | ORAL | 1 refills | Status: DC

## 2019-09-25 MED ORDER — FUROSEMIDE 40 MG PO TABS: ORAL_TABLET | ORAL | 1 refills | Status: DC

## 2019-09-25 NOTE — Addendum Note (Signed)
Addended by: Raelene Bott, Aoi Kouns L on: 09/25/2019 08:33 AM   Modules accepted: Orders

## 2019-09-25 NOTE — Telephone Encounter (Signed)
Requested Prescriptions   Signed Prescriptions Disp Refills  . metoprolol succinate (TOPROL-XL) 100 MG 24 hr tablet 90 tablet 1    Sig: TAKE 1 TABLET BY MOUTH EVERY DAY WITH OR IMMEDIATELY FOLLOWING A MEAL    Authorizing Provider: Kathlyn Sacramento A    Ordering User: NEWCOMER MCCLAIN, Anabella Capshaw L

## 2019-09-29 ENCOUNTER — Other Ambulatory Visit: Payer: Self-pay

## 2019-09-29 ENCOUNTER — Ambulatory Visit (INDEPENDENT_AMBULATORY_CARE_PROVIDER_SITE_OTHER): Payer: Medicare Other

## 2019-09-29 DIAGNOSIS — I739 Peripheral vascular disease, unspecified: Secondary | ICD-10-CM | POA: Diagnosis not present

## 2019-10-01 ENCOUNTER — Telehealth: Payer: Self-pay

## 2019-10-01 DIAGNOSIS — H524 Presbyopia: Secondary | ICD-10-CM | POA: Diagnosis not present

## 2019-10-01 DIAGNOSIS — H2511 Age-related nuclear cataract, right eye: Secondary | ICD-10-CM | POA: Diagnosis not present

## 2019-10-01 DIAGNOSIS — I739 Peripheral vascular disease, unspecified: Secondary | ICD-10-CM

## 2019-10-01 NOTE — Telephone Encounter (Signed)
-----   Message from Wellington Hampshire, MD sent at 10/01/2019 10:59 AM EDT ----- Inform patient that ABI was stable on the right and improved to normal on the left.  Repeat studies in 1 year.

## 2019-10-01 NOTE — Telephone Encounter (Signed)
Patient made aware of LE study results with verbalized understanding. Orders in Epic for 1 year repeat LE testing.

## 2019-10-02 ENCOUNTER — Ambulatory Visit (INDEPENDENT_AMBULATORY_CARE_PROVIDER_SITE_OTHER): Payer: Medicare Other | Admitting: Pulmonary Disease

## 2019-10-02 ENCOUNTER — Other Ambulatory Visit: Payer: Self-pay

## 2019-10-02 ENCOUNTER — Encounter: Payer: Self-pay | Admitting: Pulmonary Disease

## 2019-10-02 VITALS — BP 148/70 | HR 78 | Temp 97.4°F | Ht 65.0 in | Wt 184.8 lb

## 2019-10-02 DIAGNOSIS — R0602 Shortness of breath: Secondary | ICD-10-CM

## 2019-10-02 DIAGNOSIS — J449 Chronic obstructive pulmonary disease, unspecified: Secondary | ICD-10-CM

## 2019-10-02 DIAGNOSIS — I255 Ischemic cardiomyopathy: Secondary | ICD-10-CM

## 2019-10-02 DIAGNOSIS — I272 Pulmonary hypertension, unspecified: Secondary | ICD-10-CM

## 2019-10-02 NOTE — Progress Notes (Signed)
Subjective:    Patient ID: Harold Parker, male    DOB: Aug 08, 1946, 73 y.o.   MRN: [id number]  HPI The patient is a 73 year old former smoker (quit 2019) with a 40-pack-year history of smoking who presents for evaluation of shortness of breath and for management of COPD.  The patient is self-referred.  His primary care physician is Dr. Lajean Manes.  The patient has also an extensive cardiac history as noted on his record.  This has been reviewed.  He has chronic systolic heart failure with an EF of 40 to 45%, pulmonary hypertension and significant ischemic heart disease.  Patient relocated from [place] in 2019.  He has had an eventful course here and that he had a myocardial infarction during an episode of influenza A and required coronary artery bypass grafting and aortic valve replacement for aortic stenosis with a bioprosthetic valve.  This was done on February 19, 2018.  The procedure was done by Dr. Lilly Cove at [facility].  The patient is followed by Dr. Fletcher Anon for his cardiac issues.  Since his myocardial infarction and CABG surgery he has noted increased shortness of breath on exertion.  He also has noted that he continues to have some issues with nocturnal awakenings due to shortness of breath.  He had been using albuterol 2-3 times per day but recently was placed on Trelegy approximately 6 to 8 weeks ago by either Dr. Felipa Eth or Dr. Fletcher Anon, he cannot recall.  He states that this has helped his breathing quite a bit.  He has reduced the amount of albuterol he requires.  He has not had any fevers, chills or sweats.  He voices no other complaint.  Since his CABG he has not had any chest pain.  The patient had low-dose CT for lung cancer screening on 25 February 2019 and this was negative for any lung lesions.  He does have evidence of COPD and emphysema on his CT.  I have independently reviewed the CT.  Past medical history, surgical history and family history have been reviewed.  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  As noted he relocated from [place] in 2019.  Review of Systems  Constitutional: Positive for fatigue (Occasional).  HENT: Negative.   Eyes: Negative.   Respiratory: Positive for chest tightness, shortness of breath and wheezing.   Cardiovascular: Negative.   Gastrointestinal: Negative.   Endocrine: Negative.   Genitourinary: Negative.   Musculoskeletal: Negative.   Skin: Negative.   Neurological: Negative.   Hematological: Negative.   Psychiatric/Behavioral: Negative.   All other systems reviewed and are negative.      Objective:   Physical Exam Vitals signs and nursing note reviewed.  Constitutional:      Appearance: Normal appearance. He is normal weight.  HENT:     Head: Normocephalic and atraumatic.     Right Ear: External ear normal.     Left Ear: External ear normal.     Nose:     Comments: Nose/mouth/throat not examined due to masking requirements for COVID 19. Eyes:     General: No scleral icterus.    Conjunctiva/sclera: Conjunctivae normal.     Pupils: Pupils are equal, round, and reactive to light.  Neck:     Musculoskeletal: Neck supple.     Thyroid: No thyromegaly.     Vascular: No JVD.     Trachea: No  tracheal deviation.     Comments: Mild hoarseness, states this is chronic most noticed after CABG Cardiovascular:     Rate and Rhythm: Normal rate and regular rhythm.     Pulses: Normal pulses.     Heart sounds: Normal heart sounds. No murmur. No friction rub.  Pulmonary:     Effort: Pulmonary effort is normal. No accessory muscle usage.     Breath sounds: Normal air entry. No decreased breath sounds, wheezing, rhonchi or rales.     Comments: Coarse breath sounds Abdominal:     General: There is no distension.  Musculoskeletal: Normal range of motion.     Right lower leg: No edema.     Left lower leg: No edema.    Lymphadenopathy:     Cervical: No cervical adenopathy.  Skin:    General: Skin is warm and dry.  Neurological:     General: No focal deficit present.     Mental Status: He is alert and oriented to person, place, and time.  Psychiatric:        Mood and Affect: Mood normal.        Behavior: Behavior normal.    Spirometry performed on 12 February 2018 at [facility] showed FEV1 of 0.93 L or 37% predicted.  FEV1/FVC is 59%.  FVC is 1.58 L or 45% predicted.  Postbronchodilator he had a net change of 15% on FVC but no change in FEV1.  Flow volume loop difficult to interpret due to patient coughing during the maneuver.  I have reviewed those results independently.  Representative image from CT scan of the chest:    Representative image from chest x-ray performed April 2019:     Assessment & Plan:   1.  Chronic obstructive pulmonary disease, mixed type: Most recent FEV1 was preoperative to CABG, this was 0.93 L or 37% predicted with no FEV1 bronchodilator response.  This was limited to spirometry only, flow volume loop could not be interpreted as the patient was coughing during the procedure.  There was suggestion of potential restriction however this was likely due to pseudorestriction from air trapping as postbronchodilator he did have improvement in FVC indicating reduction of air trapping.  His airway obstruction was classified as severe.  Again this was before treatment before CABG.  Patient will need repeat PFTs to include lung volumes and DLCO to better characterize his obstructive lung disease.  Pulse oximetry was performed today and did not show significant desaturation past 90% on room air.  However given his nocturnal awakenings with dyspnea and his extensive cardiac disease recommend overnight oximetry to determine with regards to potential nocturnal oxygen desaturations.  Recall that the patient does have pulmonary hypertension by echocardiogram and nocturnal desaturations would  be disastrous in this situation. With regards to his inhaler therapy we will continue Trelegy for now with as needed albuterol.  This will be reassessed after his pulmonary function testing.  2.  Shortness of breath/dyspnea: This is likely multifactorial partly due to chronic systolic dysfunction, pulmonary hypertension, COPD.  Obtaining overnight oximetry as above.  3.  Ischemic cardiomyopathy: Patient is status post CABG, also status post aortic valve replacement.  LVEF 40 to 45%.  Management per cardiology.  This issue adds complexity to his management vis--vis his shortness of breath.  4.  Pulmonary hypertension/cor pulmonale: Overnight oximetry will be obtained as chronic hypoxic vasoconstriction can aggravate this issue.  He is also having issues with paroxysmal nocturnal dyspnea.  The patient will be seen after  the above testing is performed namely PFTs and overnight oximetry.  Will institute oxygen therapy pending overnight oximetry results.   Gailen Shelter, MD [facility]   This note was dictated using voice recognition software/Dragon.  Despite best efforts to proofread, errors can occur which can change the meaning.  Any change was purely unintentional.

## 2019-10-02 NOTE — Patient Instructions (Signed)
1.  We will schedule breathing tests.  We will schedule a test to check your oxygen level when you sleep.  2.  Continue Trelegy and as needed albuterol.  3.  We will see you in follow-up after the breathing tests are done.  Call sooner should any new difficulties arise.

## 2019-10-22 DIAGNOSIS — Z79899 Other long term (current) drug therapy: Secondary | ICD-10-CM | POA: Diagnosis not present

## 2019-10-24 DIAGNOSIS — R0902 Hypoxemia: Secondary | ICD-10-CM | POA: Diagnosis not present

## 2019-10-24 DIAGNOSIS — J449 Chronic obstructive pulmonary disease, unspecified: Secondary | ICD-10-CM | POA: Diagnosis not present

## 2019-10-26 ENCOUNTER — Other Ambulatory Visit: Payer: Self-pay | Admitting: *Deleted

## 2019-10-26 MED ORDER — LOSARTAN POTASSIUM 100 MG PO TABS: ORAL_TABLET | ORAL | 0 refills | Status: DC

## 2019-11-03 ENCOUNTER — Telehealth: Payer: Self-pay | Admitting: Pulmonary Disease

## 2019-11-03 NOTE — Telephone Encounter (Signed)
Called and spoke to pt, who stated that no one has contacted him regarding ONO.  ONO was ordered on 10/02/2019.  Suanne Marker, can you help with this?

## 2019-11-03 NOTE — Telephone Encounter (Signed)
CM sent to Darlina Guys and Levada Dy C with [employer] as to the status of ONO. Rhonda J [place]

## 2019-11-04 NOTE — Telephone Encounter (Signed)
Our RT department has called several times and left messages on his machine to call back to schedule the ono.  Catron, Bobetta Lime, Shonna Chock, Samuel Germany        He can call [phone] and he needs to ask for respiratory department. We can transfer him over.   Pt called and information given to patient. Pt took down phone number and will contact them to schedule.  Advised patient that if he had any problems to contact me back at [phone]. Nothing else needed at this time. Rhonda J Cobb

## 2019-11-19 ENCOUNTER — Telehealth: Payer: Self-pay | Admitting: Pulmonary Disease

## 2019-11-19 DIAGNOSIS — J449 Chronic obstructive pulmonary disease, unspecified: Secondary | ICD-10-CM

## 2019-11-19 NOTE — Telephone Encounter (Signed)
ONO reviewed by Dr. Patsey Berthold- recommend 2L QHS.  Pt is aware of results and voiced his understanding.  Pt wished to proceed with oxygen. Order has been placed. Nothing further is needed.

## 2019-11-20 ENCOUNTER — Ambulatory Visit (INDEPENDENT_AMBULATORY_CARE_PROVIDER_SITE_OTHER): Payer: Medicare Other | Admitting: Pulmonary Disease

## 2019-11-20 ENCOUNTER — Encounter: Payer: Self-pay | Admitting: Pulmonary Disease

## 2019-11-20 DIAGNOSIS — I255 Ischemic cardiomyopathy: Secondary | ICD-10-CM | POA: Diagnosis not present

## 2019-11-20 DIAGNOSIS — J449 Chronic obstructive pulmonary disease, unspecified: Secondary | ICD-10-CM | POA: Diagnosis not present

## 2019-11-20 DIAGNOSIS — I272 Pulmonary hypertension, unspecified: Secondary | ICD-10-CM | POA: Diagnosis not present

## 2019-11-20 NOTE — Progress Notes (Signed)
Subjective:    Patient ID: Harold Parker, male    DOB: October 15, 1946, 73 y.o.   MRN: [id number] Virtual Visit Via Video or Telephone Note:   This visit type was conducted due to national recommendations for restrictions regarding the COVID-19 pandemic .  This format is felt to be most appropriate for this patient at this time.  All issues noted in this document were discussed and addressed.  No physical exam was performed (except for noted visual exam findings with Video Visits).    I connected with Harold Parker by telephone at 09:00 hours and verified that I was speaking with the correct person using two identifiers. Location patient: home Location provider: [facility] Persons participating in the virtual visit: patient, physician   I discussed the limitations, risks, security and privacy concerns of performing an evaluation and management service by video and the availability of in person appointments. The patient expressed understanding and agreed to proceed.  HPI Mr. Harold Parker is a 73 year old former smoker with COPD.  By prior spirometry he has severe COPD.  We first evaluated him here on October 02, 2019.  An overnight oximetry was requested due to issues with pulmonary hypertension and cor pulmonale complicated by ischemic cardiomyopathy with ejection fraction of 40 to 45% status post CABG.  The overnight oximetry was delayed and results available yesterday.  We are following with the patient with regards to his oxygen needs.  Evaluation of that oximetry shows that the patient qualifies for nocturnal oxygen and will require 2 L/min nocturnally.  The patient has been maintained on Trelegy Ellipta and as needed albuterol.  He notes that Trelegy helps overall but does not alleviate his dyspnea completely.  He does continue to have some nocturnal awakenings with dyspnea.  He has not had any fevers, chills or sweats.  We discussed safety during the COVID-19 pandemic.  He does have  pulmonary function testing scheduled for 2 weeks from now.  He is concerned about the safety in the hospital and he was assured that all possible precautions are being taken to maintain his safety.  Review of Systems A 10 point review of systems was performed and it is as noted above otherwise negative.    Objective:   Physical Exam  No physical exam was performed.  Patient does have chronic hoarseness and this is evident during the telephone conversation.  He had fluent speech and his speech was not interrupted.     Assessment & Plan:   1.  Chronic obstructive pulmonary disease, mixed type: Most recent FEV1 was preoperative to CABG, this was 0.93 L or 37% predicted with no FEV1 bronchodilator response.  This was limited to spirometry only.  There was suggestion of potential restriction however this was likely due to pseudorestriction from air trapping as postbronchodilator he did have improvement in FVC indicating reduction of air trapping.  His airway obstruction was classified as severe. Patient will need repeat PFTs to include lung volumes and DLCO to better characterize his obstructive lung disease.  These will be done in 2 weeks time.  He was scheduled previously.Given his nocturnal awakenings with dyspnea and his extensive cardiac disease recommend overnight oximetry to determine with regards to potential nocturnal oxygen desaturations. The patient does have pulmonary hypertension by echocardiogram and nocturnal desaturations would be disastrous in this situation. His inhaler therapy for COPD is as optimized as it can be.  An inhaler will not correct hypoxemic episodes at nighttime.  He is well compensated with regards to COPD  with persistent nocturnal hypoxemia and will require oxygen supplementation nocturnally at 2 L/min.  2.  Ischemic cardiomyopathy: Patient is status post CABG, also status post aortic valve replacement.  LVEF 40 to 45%.  Management per cardiology.  This issue adds  complexity to his management vis--vis his shortness of breath.  3.  Pulmonary hypertension/cor pulmonale: Overnight oximetry has shown significant hypoxemia during sleep, chronic hypoxic vasoconstriction can worsen pulmonary hypertension and cor pulmonale.    He will be placed on oxygen supplementation at 2 L/min.    Gailen Shelter, MD [facility]   This note was dictated using voice recognition software/Dragon.  Despite best efforts to proofread, errors can occur which can change the meaning.  Any change was purely unintentional.

## 2019-11-20 NOTE — Patient Instructions (Addendum)
1.  Oxygen has been ordered.  2.  Do keep your appointment for the pulmonary function test.  3.  Follow-up as previously noted.

## 2019-11-30 ENCOUNTER — Other Ambulatory Visit: Payer: Self-pay

## 2019-11-30 ENCOUNTER — Telehealth: Payer: Self-pay

## 2019-11-30 NOTE — Telephone Encounter (Signed)
Pt is aware of date/time pf covid test.

## 2019-12-02 ENCOUNTER — Other Ambulatory Visit
Admission: RE | Admit: 2019-12-02 | Discharge: 2019-12-02 | Disposition: A | Discharge status: Home / Self Care | Payer: Medicare Other | Source: Ambulatory Visit | Attending: Pulmonary Disease | Admitting: Pulmonary Disease

## 2019-12-02 DIAGNOSIS — Z20828 Contact with and (suspected) exposure to other viral communicable diseases: Secondary | ICD-10-CM | POA: Insufficient documentation

## 2019-12-02 DIAGNOSIS — Z01812 Encounter for preprocedural laboratory examination: Secondary | ICD-10-CM | POA: Diagnosis present

## 2019-12-02 LAB — SARS CORONAVIRUS 2 (TAT 6-24 HRS): SARS Coronavirus 2: NEGATIVE

## 2019-12-03 ENCOUNTER — Ambulatory Visit: Payer: Medicare Other | Attending: Pulmonary Disease

## 2019-12-03 ENCOUNTER — Other Ambulatory Visit: Payer: Self-pay

## 2019-12-03 DIAGNOSIS — R0602 Shortness of breath: Secondary | ICD-10-CM | POA: Insufficient documentation

## 2019-12-03 MED ORDER — ALBUTEROL SULFATE (2.5 MG/3ML) 0.083% IN NEBU
2.5000 mg | INHALATION_SOLUTION | Freq: Once | RESPIRATORY_TRACT | Status: AC
  Administered 2019-12-03: 2.5 mg via RESPIRATORY_TRACT
  Filled 2019-12-03: qty 3

## 2019-12-09 ENCOUNTER — Other Ambulatory Visit: Payer: Self-pay | Admitting: Cardiovascular Disease

## 2019-12-09 MED ORDER — LOSARTAN POTASSIUM 100 MG PO TABS: ORAL_TABLET | ORAL | 0 refills | Status: DC

## 2019-12-09 NOTE — Telephone Encounter (Signed)
*  STAT* If patient is at the pharmacy, call can be transferred to refill team.   1. Which medications need to be refilled? (please list name of each medication and dose if known) losartan 100mg  daily  2. Which pharmacy/location (including street and city if local pharmacy) is medication to be sent to?  [facility] [address] in [place]  3. Do they need a 30 day or 90 day supply? [place]

## 2019-12-09 NOTE — Telephone Encounter (Signed)
Requested Prescriptions   Signed Prescriptions Disp Refills   losartan (COZAAR) 100 MG tablet 90 tablet 0    Sig: TAKE 1 TABLET(100 MG) BY MOUTH DAILY    Authorizing Provider: Kathlyn Sacramento A    Ordering User: Raelene Bott, Walid Haig L

## 2019-12-14 ENCOUNTER — Ambulatory Visit: Payer: Medicare Other | Admitting: Pulmonary Disease

## 2019-12-17 ENCOUNTER — Encounter: Payer: Self-pay | Admitting: Pulmonary Disease

## 2019-12-17 ENCOUNTER — Other Ambulatory Visit: Payer: Self-pay

## 2019-12-17 ENCOUNTER — Ambulatory Visit: Payer: Medicare Other | Admitting: Pulmonary Disease

## 2019-12-17 VITALS — BP 124/78 | HR 76 | Temp 97.2°F | Ht 66.0 in | Wt 184.6 lb

## 2019-12-17 DIAGNOSIS — I272 Pulmonary hypertension, unspecified: Secondary | ICD-10-CM

## 2019-12-17 DIAGNOSIS — J449 Chronic obstructive pulmonary disease, unspecified: Secondary | ICD-10-CM

## 2019-12-17 DIAGNOSIS — R0602 Shortness of breath: Secondary | ICD-10-CM

## 2019-12-17 DIAGNOSIS — I255 Ischemic cardiomyopathy: Secondary | ICD-10-CM

## 2019-12-17 MED ORDER — BREZTRI AEROSPHERE 160-9-4.8 MCG/ACT IN AERO: 2.0000 | INHALATION_SPRAY | Freq: Two times a day (BID) | RESPIRATORY_TRACT | 11 refills | Status: DC

## 2019-12-17 NOTE — Progress Notes (Signed)
 Assessment & Plan:  1. COPD, severity to be determined (HCC) (Primary)  2. Shortness of breath  3. Ischemic cardiomyopathy  4. Pulmonary hypertension (HCC)   Patient Instructions  We will switch you to Breztri  2 puffs twice a day this will replace the Trelegy  We will see you in follow-up in 3 months time call sooner should any new difficulties arise  Please note: late entry documentation due to logistical difficulties during COVID-19 pandemic. This note is filed for information purposes only, and is not intended to be used for billing, nor does it represent the full scope/nature of the visit in question. Please see any associated scanned media linked to date of encounter for additional pertinent information.  Subjective:    HPI: Harold Parker is a 73 y.o. male presenting to the pulmonology clinic on 12/17/2019 with report of: Follow-up (Some days are better than others with his SOB. Patient denies any cough, wheezing, fever, or chills.)  This is a 73 year old former smoker, has known COPD by prior spirometry, severe COPD   Outpatient Encounter Medications as of 12/17/2019  Medication Sig Note   acetaminophen  (TYLENOL ) 500 MG tablet Take 1,000 mg by mouth daily as needed for moderate pain or headache.    ALPRAZolam  (XANAX ) 0.5 MG tablet Take 0.5 mg by mouth 2 (two) times daily.    aspirin  EC 81 MG tablet Take 81 mg by mouth daily.    esomeprazole (NEXIUM) 40 MG capsule Take 40 mg by mouth at bedtime.     ezetimibe  (ZETIA ) 10 MG tablet Take 10 mg by mouth at bedtime.     metFORMIN  (GLUCOPHAGE ) 1000 MG tablet TAKE 1 TABLET BY MOUTH TWICE DAILY WITH A MEAL    PROAIR  HFA 108 (90 Base) MCG/ACT inhaler Inhale 1 puff into the lungs every 4 (four) hours as needed for wheezing or shortness of breath.    [DISCONTINUED] amLODipine  (NORVASC ) 5 MG tablet Take 5 mg by mouth daily.    [DISCONTINUED] Ascorbic Acid  (VITAMIN C ) 1000 MG tablet Take 1,000 mg by mouth daily.    [DISCONTINUED]  folic acid  (FOLVITE ) 400 MCG tablet Take 400 mcg by mouth every evening.    [DISCONTINUED] furosemide  (LASIX ) 40 MG tablet TAKE 1 TABLET(40 MG) BY MOUTH DAILY    [DISCONTINUED] glipiZIDE  (GLUCOTROL ) 5 MG tablet Take 0.5 tablets (2.5 mg total) by mouth daily before breakfast. (Patient taking differently: Take 5 mg by mouth 2 (two) times daily before a meal. Take 5 mg in the morning and take a second 5 mg dose at night on Mon, Wed, and Fri) 11/24/2021: Takes 1 tablet bid.   [DISCONTINUED] ipratropium-albuterol  (DUONEB) 0.5-2.5 (3) MG/3ML SOLN Take 3 mLs by nebulization every 6 (six) hours as needed. (Patient taking differently: Take 3 mLs by nebulization every 6 (six) hours as needed (shortness of breath).)    [DISCONTINUED] losartan  (COZAAR ) 100 MG tablet TAKE 1 TABLET(100 MG) BY MOUTH DAILY    [DISCONTINUED] Melatonin 10 MG CAPS Take 10 mg by mouth at bedtime. (Patient not taking: Reported on 05/26/2021)    [DISCONTINUED] metoprolol  succinate (TOPROL -XL) 100 MG 24 hr tablet TAKE 1 TABLET BY MOUTH EVERY DAY WITH OR IMMEDIATELY FOLLOWING A MEAL    [DISCONTINUED] TRELEGY ELLIPTA 100-62.5-25 MCG/INH AEPB INHALE 1 PUFF PO INTO THE LUNGS D    [DISCONTINUED] Vitamin D , Cholecalciferol , 1000 units TABS Take 1,000 Units by mouth every morning.  (Patient not taking: Reported on 02/27/2022)    [DISCONTINUED] Budeson-Glycopyrrol-Formoterol  (BREZTRI  AEROSPHERE) 160-9-4.8 MCG/ACT AERO Inhale 2 puffs  into the lungs 2 (two) times daily.    No facility-administered encounter medications on file as of 12/17/2019.      Objective:   Vitals:   12/17/19 1042  BP: 124/78  Pulse: 76  Temp: (!) 97.2 F (36.2 C)  Height: 5' 6 (1.676 m)  Weight: 184 lb 9.6 oz (83.7 kg)  SpO2: 90% Comment: on RA  TempSrc: Temporal  BMI (Calculated): 29.81     Physical exam documentation is limited by delayed entry of information.

## 2019-12-17 NOTE — Patient Instructions (Signed)
We will switch you to [facility] 2 puffs twice a day this will replace the Trelegy  We will see you in follow-up in 3 months time call sooner should any new difficulties arise

## 2019-12-30 DIAGNOSIS — H2511 Age-related nuclear cataract, right eye: Secondary | ICD-10-CM | POA: Diagnosis not present

## 2019-12-30 DIAGNOSIS — H25811 Combined forms of age-related cataract, right eye: Secondary | ICD-10-CM | POA: Diagnosis not present

## 2020-01-01 ENCOUNTER — Telehealth: Payer: Self-pay | Admitting: Pulmonary Disease

## 2020-01-01 DIAGNOSIS — J449 Chronic obstructive pulmonary disease, unspecified: Secondary | ICD-10-CM

## 2020-01-01 NOTE — Telephone Encounter (Signed)
Called and spoke to pt, who is requesting an order for humidifier for oxygen.  Dr. Jayme Cloud, please advise if okay to order. Thanks

## 2020-01-02 NOTE — Telephone Encounter (Signed)
That is fine 

## 2020-01-04 NOTE — Telephone Encounter (Signed)
Order has been placed.  Pt is aware and voiced his understanding.  Nothing further is needed.

## 2020-01-25 ENCOUNTER — Other Ambulatory Visit: Payer: Self-pay | Admitting: Cardiovascular Disease

## 2020-02-06 ENCOUNTER — Ambulatory Visit: Payer: Medicare Other | Attending: Internal Medicine

## 2020-02-06 DIAGNOSIS — Z23 Encounter for immunization: Secondary | ICD-10-CM | POA: Insufficient documentation

## 2020-02-06 NOTE — Progress Notes (Signed)
   Covid-19 Vaccination Clinic  Name:  Harold Parker    MRN: [id number] DOB: 30-Jul-1946  02/06/2020  Harold Parker was observed post Covid-19 immunization for 15 minutes without incidence. He was provided with Vaccine Information Sheet and instruction to access the V-Safe system.   Harold Parker was instructed to call 911 with any severe reactions post vaccine: Harold Parker Kitchen Difficulty breathing  . Swelling of your face and throat  . A fast heartbeat  . A bad rash all over your body  . Dizziness and weakness    Immunizations Administered    Name Date Dose VIS Date Route   Pfizer COVID-19 Vaccine 02/06/2020 10:59 AM 0.3 mL 11/27/2019 Intramuscular   Manufacturer: [employer], [employer]   Lot: [id number]   NDC: [address]-3

## 2020-02-24 ENCOUNTER — Other Ambulatory Visit: Payer: Self-pay | Admitting: Cardiovascular Disease

## 2020-02-24 MED ORDER — FUROSEMIDE 40 MG PO TABS: ORAL_TABLET | ORAL | 0 refills | Status: DC

## 2020-02-24 NOTE — Telephone Encounter (Signed)
Requested Prescriptions   Signed Prescriptions Disp Refills   furosemide (LASIX) 40 MG tablet 90 tablet 0    Sig: TAKE 1 TABLET(40 MG) BY MOUTH DAILY    Authorizing Provider: [name] A    Ordering User: Thayer Headings, Keishon Chavarin L

## 2020-02-24 NOTE — Telephone Encounter (Signed)
*  STAT* If patient is at the pharmacy, call can be transferred to refill team.   1. Which medications need to be refilled? (please list name of each medication and dose if known) furosemide 40 mg  2. Which pharmacy/location (including street and city if local pharmacy) [facility] [address]  3. Do they need a 30 day or 90 day supply? 90

## 2020-02-25 DIAGNOSIS — Z0000 Encounter for general adult medical examination without abnormal findings: Secondary | ICD-10-CM | POA: Diagnosis not present

## 2020-02-25 DIAGNOSIS — N1831 Chronic kidney disease, stage 3a: Secondary | ICD-10-CM | POA: Diagnosis not present

## 2020-02-25 DIAGNOSIS — E1121 Type 2 diabetes mellitus with diabetic nephropathy: Secondary | ICD-10-CM | POA: Diagnosis not present

## 2020-02-25 DIAGNOSIS — J9611 Chronic respiratory failure with hypoxia: Secondary | ICD-10-CM | POA: Diagnosis not present

## 2020-02-25 DIAGNOSIS — E1151 Type 2 diabetes mellitus with diabetic peripheral angiopathy without gangrene: Secondary | ICD-10-CM | POA: Diagnosis not present

## 2020-02-25 DIAGNOSIS — Z1389 Encounter for screening for other disorder: Secondary | ICD-10-CM | POA: Diagnosis not present

## 2020-02-25 DIAGNOSIS — E7800 Pure hypercholesterolemia, unspecified: Secondary | ICD-10-CM | POA: Diagnosis not present

## 2020-02-25 DIAGNOSIS — I700 Atherosclerosis of aorta: Secondary | ICD-10-CM | POA: Diagnosis not present

## 2020-02-25 DIAGNOSIS — I129 Hypertensive chronic kidney disease with stage 1 through stage 4 chronic kidney disease, or unspecified chronic kidney disease: Secondary | ICD-10-CM | POA: Diagnosis not present

## 2020-02-25 DIAGNOSIS — J449 Chronic obstructive pulmonary disease, unspecified: Secondary | ICD-10-CM | POA: Diagnosis not present

## 2020-02-25 DIAGNOSIS — Z79899 Other long term (current) drug therapy: Secondary | ICD-10-CM | POA: Diagnosis not present

## 2020-02-29 ENCOUNTER — Ambulatory Visit: Payer: Medicare Other | Attending: Internal Medicine

## 2020-02-29 DIAGNOSIS — Z23 Encounter for immunization: Secondary | ICD-10-CM

## 2020-02-29 NOTE — Progress Notes (Signed)
   Covid-19 Vaccination Clinic  Name:  [name] Biggar    MRN: [id number] DOB: September 08, 1946  02/29/2020  Mr. Overturf was observed post Covid-19 immunization for 15 minutes without incident. He was provided with Vaccine Information Sheet and instruction to access the V-Safe system.   Mr. Tugwell was instructed to call 911 with any severe reactions post vaccine: Marland Kitchen Difficulty breathing  . Swelling of face and throat  . A fast heartbeat  . A bad rash all over body  . Dizziness and weakness   Immunizations Administered    Name Date Dose VIS Date Route   Pfizer COVID-19 Vaccine 02/29/2020  9:46 AM 0.3 mL 11/27/2019 Intramuscular   Manufacturer: [employer], [employer]   Lot: [id number]   NDC: [address]-0

## 2020-03-07 ENCOUNTER — Other Ambulatory Visit: Payer: Self-pay

## 2020-03-07 ENCOUNTER — Ambulatory Visit (INDEPENDENT_AMBULATORY_CARE_PROVIDER_SITE_OTHER): Payer: Medicare Other | Admitting: Family

## 2020-03-07 ENCOUNTER — Encounter: Payer: Self-pay | Admitting: Family

## 2020-03-07 VITALS — BP 142/62 | HR 66 | Ht 65.0 in | Wt 185.0 lb

## 2020-03-07 DIAGNOSIS — I2510 Atherosclerotic heart disease of native coronary artery without angina pectoris: Secondary | ICD-10-CM | POA: Diagnosis not present

## 2020-03-07 DIAGNOSIS — I739 Peripheral vascular disease, unspecified: Secondary | ICD-10-CM | POA: Diagnosis not present

## 2020-03-07 DIAGNOSIS — E785 Hyperlipidemia, unspecified: Secondary | ICD-10-CM

## 2020-03-07 DIAGNOSIS — I452 Bifascicular block: Secondary | ICD-10-CM

## 2020-03-07 DIAGNOSIS — I359 Nonrheumatic aortic valve disorder, unspecified: Secondary | ICD-10-CM | POA: Diagnosis not present

## 2020-03-07 DIAGNOSIS — I10 Essential (primary) hypertension: Secondary | ICD-10-CM | POA: Diagnosis not present

## 2020-03-07 DIAGNOSIS — I5022 Chronic systolic (congestive) heart failure: Secondary | ICD-10-CM | POA: Diagnosis not present

## 2020-03-07 DIAGNOSIS — I255 Ischemic cardiomyopathy: Secondary | ICD-10-CM

## 2020-03-07 NOTE — Progress Notes (Signed)
Office Visit    Patient Name: Harold Parker Date of Encounter: 03/07/2020  Primary Care Provider:  Merlene Laughter, MD Primary Cardiologist:  [name], MD Electrophysiologist:  None   Chief Complaint    Harold Parker is a 74 y.o. male with a hx of PAD, CAD and aortic stenosis s/p CABG and bioprosthetic aortic valve in 2019, HTN, HLD, DM2, ICM presents today for follow-up of PAD, CAD.  Past Medical History    Past Medical History:  Diagnosis Date  . Bilateral carotid bruits    a. 01/2018 U/S: < 50% bilat ICA stenosis.  Marland Kitchen CAD (coronary artery disease)    a. 1998 s/p MI and [facility], [place]); b. 1999 redo PCI/rotablator in setting of what sounds like ISR;  c. Multiple stress tests over the years - last ~ 2017, reportedly nl; d. 01/2018 NSTEMI/Cath: LM 70m/d, LAD 50p, 40p/m, D1 60ost, OM1 95, RCA 100ost/p w/ L->R collats, EF 45%; e. s/p 3V CABG 02/19/18 (LIMA-LAD, VG-D1, VG-OM)  . Chronic lower back pain   . COPD (chronic obstructive pulmonary disease) (HCC)   . GIB (gastrointestinal bleeding)    a. 02/2018 GIB and anemia w/ Hgb of 4.7 on presentation; b. 03/2018 EGD: 2 nonbleeding duodenal ulcers.  Marland Kitchen HTN (hypertension)   . Hypercholesteremia   . Ischemic cardiomyopathy    a. 01/2018 Echo: EF 40-45%, mid-apicalanteroseptal, ant, apical sev HK, mod apicalinferior HK. Gr2 DD. Mod AS, mild MR, mod dil LA, PASP .  . Moderate aortic stenosis    a. 01/2018 Echo: Mod AS, mean grad (S) , Valve area (VTI) 1.06 cm^2, (Vmax) 1.27 cm^2; b. 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  . Myocardial infarction (HCC) ~ 1998/1999  . 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 02/19/2018 AVR: 25 mm Edwards Inspiris Resilia stented bovine pericardial tissue valve  . 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 Tobacco abuse   . Type II diabetes mellitus (HCC)    Past Surgical History:  Procedure Laterality Date  . ABDOMINAL  AORTOGRAM N/A 03/04/2019   Procedure: ABDOMINAL AORTOGRAM;  Surgeon: Iran Ouch, MD;  Location: [facility];  Service: Cardiovascular;  Laterality: N/A;  . AORTIC VALVE REPLACEMENT N/A 02/19/2018   Procedure: AORTIC VALVE REPLACEMENT (AVR);  Surgeon: Purcell Nails, MD;  Location: [facility] OR;  Service: Open Heart Surgery;  Laterality: N/A;  . COLONOSCOPY    . CORONARY ANGIOPLASTY WITH STENT PLACEMENT  ~ 1998/1999  . CORONARY ARTERY BYPASS GRAFT N/A 02/19/2018   Procedure: CORONARY ARTERY BYPASS GRAFTING (CABG) x three , using left internal mammary artery and right leg greater saphenous vein harvested endoscopically;  Surgeon: Purcell Nails, MD;  Location: [facility] OR;  Service: Open Heart Surgery;  Laterality: N/A;  . ESOPHAGOGASTRODUODENOSCOPY (EGD) WITH PROPOFOL N/A 03/18/2018   Procedure: ESOPHAGOGASTRODUODENOSCOPY (EGD) WITH PROPOFOL;  Surgeon: Midge Minium, MD;  Location: [facility] ENDOSCOPY;  Service: Endoscopy;  Laterality: N/A;  . LEFT HEART CATH AND CORONARY ANGIOGRAPHY N/A 02/06/2018   Procedure: LEFT HEART CATH AND CORONARY ANGIOGRAPHY;  Surgeon: Iran Ouch, MD;  Location: [facility] INVASIVE CV LAB;  Service: Cardiovascular;  Laterality: N/A;  . LOWER EXTREMITY ANGIOGRAPHY Bilateral 03/04/2019   Procedure: Lower Extremity Angiography;  Surgeon: Iran Ouch, MD;  Location: [facility];  Service: Cardiovascular;  Laterality: Bilateral;  . PERIPHERAL VASCULAR ATHERECTOMY Left 03/04/2019   Procedure: PERIPHERAL VASCULAR ATHERECTOMY;  Surgeon: Iran Ouch, MD;  Location: [facility] INVASIVE CV LAB;  Service: Cardiovascular;  Laterality: Left;  SFA  . TEE WITHOUT CARDIOVERSION N/A 02/19/2018   Procedure: TRANSESOPHAGEAL ECHOCARDIOGRAM (TEE);  Surgeon: Purcell Nails, MD;  Location: [facility] OR;  Service: Open Heart Surgery;  Laterality: N/A;  . TONSILLECTOMY     Allergies  No Known Allergies  History of Present Illness    Harold Parker is a 74 y.o. male with a hx of PAD, CAD and aortic  stenosis s/p CABG and bioprosthetic aortic valve in 2019, HTN, HLD, DM2, ICM.  He is a retired [employer] after but was working most recently as a Scientist, clinical (histocompatibility and immunogenetics). he was last seen 08/25/19 by Dr. Kirke Corin.  Presented in February 2019 with NSTEMI.  Cardiac cath revealed severe left main stenosis in addition to significant disease involving OM1 and RCA.  EF 40-45% with moderate aortic stenosis.  Underwent CABG and aortic valve replacement in March 2019.  Postoperative atrial fibrillation was controlled with amiodarone.  Discharged home on aspirin and Plavix.  Presented with upper GI bleed with hemoglobin of 4.7.  EGD showed duodenal ulcers.  Taken off dual antiplatelet therapy and low-dose aspirin eventually resumed.  Echo April 2019 with LVEF 40-45%, normal functioning bioprosthetic aortic valve.  Known bilateral leg claudication.  Underwent angiography in March 2020 with no significant aortoiliac disease.  On the right noted long heavily calcified occlusion of the SFA with well-developed collaterals in the profunda and three-vessel runoff below the knee.  On the left noted heavily calcified disease affecting left SFA with three-vessel runoff below the knee.  He underwent successful orbital atherectomy and DES to the left SFA.  Post procedure ABI improved to 0.75 on the left and right was stable at 0.66.  Pleasant gentleman who enjoys riding his motorcycle and working in his yard and spare time.  Reports no chest pain, pressure, tightness.  Tells me he sometimes gets dyspnea when he is anxious or when he is out in the cold weather.  Tells me leg pain is about the same.  He will get leg cramping after sitting on his motorcycle for long periods of time but no pain with ambulation.  Endorses following a heart healthy diet.  EKGs/Labs/Other Studies Reviewed:   The following studies were reviewed today:  EKG:  EKG is ordered today.  The ekg ordered today demonstrates SR 66 bpm with bifascicular block RBBB  and LPFB no acute ST/T wave changes. Compared to previous the left posterior fascicular block and subsequent bifascicular block are new.  Recent Labs: No results found for requested labs within last 8760 hours.  Recent Lipid Panel    Component Value Date/Time   CHOL 102 03/27/2018 0900   TRIG 84 03/27/2018 0900   HDL 35 (L) 03/27/2018 0900   CHOLHDL 2.9 03/27/2018 0900   CHOLHDL 3.3 02/06/2018 0244   VLDL 23 02/06/2018 0244   LDLCALC 50 03/27/2018 0900    Home Medications   Current Meds  Medication Sig  . atorvastatin (LIPITOR) 80 MG tablet Take 80 mg by mouth daily.    Review of Systems   Review of Systems  Constitution: Negative for chills, fever and malaise/fatigue.  Cardiovascular: Negative for chest pain, dyspnea on exertion, leg swelling, near-syncope, orthopnea, palpitations and syncope.  Respiratory: Negative for cough, shortness of breath ("with cold weather or anxiety") and wheezing.   Gastrointestinal: Negative for nausea and vomiting.  Neurological: Negative for dizziness, light-headedness and weakness.   All other systems reviewed  and are otherwise negative except as noted above.  Physical Exam    VS:  BP (!) 142/62 (BP Location: Left Arm, Patient Position: Sitting, Cuff Size: Normal)   Pulse 66   Ht 5\' 5"  (1.651 m)   Wt 185 lb (83.9 kg)   SpO2 90%   BMI 30.79 kg/m  , BMI Body mass index is 30.79 kg/m. GEN: Well nourished, well developed, in no acute distress. HEENT: normal. Neck: Supple, no JVD or masses. Cardiac: RRR, no murmurs, rubs, or gallops. No clubbing, cyanosis, edema.  Radials/PT 2+ and equal bilaterally.  Respiratory:  Respirations regular and unlabored, clear to auscultation bilaterally. GI: Soft, nontender, nondistended, BS + x 4. MS: No deformity or atrophy. Skin: Warm and dry, no rash. Neuro:  Strength and sensation are intact. Psych: Normal affect.  Assessment & Plan    1. PAD - s/p atherectomy and drug coated balloon angioplasty  of L SFA. ABI 09/2019 showed R side stable and L improved to normal. Recommend repeat study October 2021. No symptoms of claudication. No Clopidogrel secondary to previous GI bleed. Continue aspirin.   2. CAD s/p CABG -Stable with no anginal symptoms.  No indication for ischemic evaluation at this time.  GDMT includes aspirin, beta-blocker, statin.  3. AS s/p bioprosthestic valve - No shortness of breath, lightheadedness, chest pain.  Continue optimal control of blood pressure and volume status.  We will plan to update echocardiogram in October when he has ABIs done as well.   4. Chronic systolic heart failure/ICM - Echo 01/2018 LVEF 40-45% with moderate to severe pulmonary hypertension.  Euvolemic and well compensated on exam today.  GDMT includes beta-blocker, ARB, loop diuretic.  We will plan to update echocardiogram in October, as above.  Recommend low-sodium, healthy diet.  5. HLD -tolerating atorvastatin 80 mg and Zetia 10 mg daily.  Continue to follow with primary care provider.  LDL goal less than 70 in the setting of CAD and PAD.  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  Patient education provided.  He reports no lightheadedness, dizziness, near-syncope, syncope.  Continue to monitor with EKGs.  Disposition: Follow up in 6 month(s) with Dr. Fletcher Anon or APP   Loel Dubonnet, NP 03/07/2020, 9:46 AM

## 2020-03-07 NOTE — Patient Instructions (Addendum)
Medication Instructions:  No medication changes today.  *If you need a refill on your cardiac medications before your next appointment, please call your pharmacy*   Lab Work: None ordered today  If you have labs (blood work) drawn today and your tests are completely normal, you will receive your results only by: Marland Kitchen MyChart Message (if you have MyChart) OR . A paper copy in the mail If you have any lab test that is abnormal or we need to change your treatment, we will call you to review the results.   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 This means that 2 of the 3 electrical pathways in the bottom of your heart move a little slower than they used to - this is not concerning but we want you to tell us if you have new lightheadedness, dizziness, or episodes of almost passing out.   1-We will plan to check your ultrasound of your legs (and your heart) in October.   2- Echo  Please return to [facility] on _________OCT 2021 if possible when he has ABIs_____ at _______________ AM/PM for an Echocardiogram. Your physician has requested that you have an echocardiogram. Echocardiography is a painless test that uses sound waves to create images of your heart. It provides your doctor with information about the size and shape of your heart and how well your heart's chambers and valves are working. This procedure takes approximately one hour. There are no restrictions for this procedure. Please note; depending on visual quality an IV may need to be placed.    Follow-Up: At [facility], you and your health needs are our priority.  As part of our continuing mission to provide you with exceptional heart care, we have created designated Provider Care Teams.  These Care Teams include your primary Cardiologist (physician) and Advanced Practice Providers (APPs -  Physician Assistants and Nurse Practitioners) who all work together to  provide you with the care you need, when you need it.  We recommend signing up for the patient portal called "MyChart".  Sign up information is provided on this After Visit Summary.  MyChart is used to connect with patients for Virtual Visits (Telemedicine).  Patients are able to view lab/test results, encounter notes, upcoming appointments, etc.  Non-urgent messages can be sent to your provider as well.   To learn more about what you can do with MyChart, go to [email or web address].    Your next appointment: 6 months with Dr. Fletcher Anon (after ultrasounds)   Pursed Lip Breathing Pursed lip breathing is a technique to relieve the feeling of being short of breath. Some long-term respiratory conditions, like chronic obstructive pulmonary disease (COPD) and severe asthma, can make it hard to breathe out (exhale) all of the air in your lungs. This can make air that has less oxygen than normal build up in your lungs (air trapping). Trapped air means your lungs fill with less fresh air when you breathe in (inhale). As a result, you feel short of breath. Pursed lip breathing keeps your airways open longer when you exhale and empties more air from your lungs. This makes more space for fresh air when you inhale. Pursed lip breathing can also slow down your breathing and help your body not have to work so hard to breathe. Over time, pursed lip breathing may help you be able to be more physically active and do more activities. How to perform pursed lip breathing Being short of breath can  make you tense and anxious. Before you start this breathing exercise, take a minute to relax your shoulders and close your eyes. Then: 1. Start the exercise by closing your mouth. 2. Breathe in through your nose, taking a normal breath. You can do this at your normal rate of breathing. If you feel you are not getting enough air, breathe in while slowly counting to 2 or 3. 3. Pucker (purse) your lips as if you were going to  whistle. 4. Gently tighten your abdomen muscles or press on your belly to help push the air out. 5. Breathe out slowly through your pursed lips. Take at least twice as long to breathe out as it takes you to breathe in. 6. Make sure that you breathe out all of the air, but do not force air out. 7. Repeat the exercise until your breathing improves. Ask your health care provider how often and how long to do this exercise. Follow these instructions at home:  Take over-the-counter and prescription medicines only as told by your health care provider.  Return to your normal activities as told by your health care provider. Ask your health care provider what activities are safe for you.  Do not use any products that contain nicotine or tobacco, such as cigarettes and e-cigarettes. If you need help quitting, ask your health care provider.  Keep all follow-up visits as told by your health care provider. This is important. Contact a health care provider if:  Your shortness of breath gets worse.  You become less able to exercise or be active.  You develop a cough.  You develop a fever. Get help right away if:  You are struggling to breathe.  Your shortness of breath prevents you from engaging in any activity. Summary  Pursed lip breathing is a breathing technique that helps to remove trapped air from your lungs. It helps you get more oxygen into your lungs and makes your body have to work less hard to breathe.  Pursed lip breathing can gradually make you more able to be physically active.  You can do pursed lip breathing on your own at home.  Ask your health care provider how often and how long you should do pursed lip breathing. This information is not intended to replace advice given to you by your health care provider. Make sure you discuss any questions you have with your health care provider. Document Revised: 11/15/2017 Document Reviewed: 10/25/2016 Elsevier Patient Education  2020  [employer].

## 2020-04-14 ENCOUNTER — Ambulatory Visit (INDEPENDENT_AMBULATORY_CARE_PROVIDER_SITE_OTHER): Payer: Medicare Other | Admitting: Pulmonary Disease

## 2020-04-14 ENCOUNTER — Encounter: Payer: Self-pay | Admitting: Pulmonary Disease

## 2020-04-14 ENCOUNTER — Other Ambulatory Visit: Payer: Self-pay

## 2020-04-14 VITALS — BP 132/70 | HR 69 | Temp 97.7°F | Ht 65.0 in | Wt 182.0 lb

## 2020-04-14 DIAGNOSIS — I255 Ischemic cardiomyopathy: Secondary | ICD-10-CM | POA: Diagnosis not present

## 2020-04-14 DIAGNOSIS — J9611 Chronic respiratory failure with hypoxia: Secondary | ICD-10-CM | POA: Diagnosis not present

## 2020-04-14 DIAGNOSIS — J449 Chronic obstructive pulmonary disease, unspecified: Secondary | ICD-10-CM

## 2020-04-14 DIAGNOSIS — I272 Pulmonary hypertension, unspecified: Secondary | ICD-10-CM

## 2020-04-14 NOTE — Patient Instructions (Signed)
We are going to refer you to pulmonary rehab, this will help you for sizes to be more effective with your breathing  We have sent a request for portable concentrator for travel to [facility]  Continue Harold Parker  We will see you in follow-up in all sooner should any new difficulties arise.

## 2020-04-14 NOTE — Progress Notes (Signed)
Subjective:    Patient ID: Harold Parker, male    DOB: Dec 07, 1946, 74 y.o.   MRN: [id number]  HPI The patient is a 74 year old former smoker (quit 2019) with a 40-pack-year history of smoking, who presents for follow-up on shortness of breath and COPD.  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 Lilly Cove and at [facility].  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  He has evidence of cor pulmonale by echocardiogram performed in February 2020.  He has been compliant with nocturnal oxygen.  He has also been compliant with his current medications for management of COPD which include Breztri 2 inhalations twice a day and as needed albuterol.  He was switched to [place] during his last visit.  He notes that this is helping him significantly.  He has not had any fevers, chills or sweats.  No cough or sputum production.  No hemoptysis.   Review of Systems A 10 point review of systems was performed and it is as noted above otherwise negative.   Immunization History  Administered Date(s) Administered  . Influenza Inj Mdck Quad Pf 12/02/2017  . Influenza Split 12/11/2014, 11/01/2019  . PFIZER SARS-COV-2 Vaccination 02/06/2020, 02/29/2020    Current Meds  Medication Sig  . acetaminophen (TYLENOL) 500 MG tablet Take 1,000 mg by mouth daily as needed for moderate pain or headache.  . ALPRAZolam (XANAX) 0.5 MG tablet Take 0.5 mg by mouth 2 (two) times daily.   Marland Kitchen amLODipine (NORVASC) 5 MG tablet Take 5 mg by mouth daily.  . Ascorbic Acid (VITAMIN C) 1000 MG tablet Take 1,000 mg by mouth daily.  Marland Kitchen aspirin EC 81 MG tablet Take 81 mg by mouth daily.  Marland Kitchen atorvastatin (LIPITOR) 80 MG tablet Take 80 mg by mouth daily.  . Budeson-Glycopyrrol-Formoterol (BREZTRI AEROSPHERE) 160-9-4.8 MCG/ACT AERO Inhale 2  puffs into the lungs 2 (two) times daily.  Marland Kitchen esomeprazole (NEXIUM) 40 MG capsule Take 40 mg by mouth at bedtime.   Marland Kitchen ezetimibe (ZETIA) 10 MG tablet Take 10 mg by mouth at bedtime.   . folic acid (FOLVITE) 025 MCG tablet Take 400 mcg by mouth every evening.  . furosemide (LASIX) 40 MG tablet TAKE 1 TABLET(40 MG) BY MOUTH DAILY  . glipiZIDE (GLUCOTROL) 5 MG tablet Take 0.5 tablets (2.5 mg total) by mouth daily before breakfast. (Patient taking differently: Take 5 mg by mouth See admin instructions. Take 5 mg in the morning and take a second 5 mg dose at night on Mon, Wed, and Fri)  . ipratropium-albuterol (DUONEB) 0.5-2.5 (3) MG/3ML SOLN Take 3 mLs by nebulization every 6 (six) hours as needed. (Patient taking differently: Take 3 mLs by nebulization every 6 (six) hours as needed (shortness of breath). )  . losartan (COZAAR) 100 MG tablet TAKE 1 TABLET BY MOUTH EVERY DAY Please call to schedule appointment for further refills. Thank you! (Patient taking differently: 50 mg. TAKE 1 TABLET BY MOUTH EVERY DAY Please call to schedule appointment for further refills. Thank you!)  . Melatonin 10 MG CAPS Take 10 mg by mouth at bedtime.  . metFORMIN (GLUCOPHAGE) 1000 MG tablet TAKE 1 TABLET BY MOUTH TWICE DAILY WITH A MEAL (Patient taking differently: Take 1,000 mg by mouth 2 (two) times daily. )  . metoprolol succinate (TOPROL-XL) 100 MG 24 hr tablet TAKE 1 TABLET BY  MOUTH EVERY DAY WITH OR IMMEDIATELY FOLLOWING A MEAL  . PROAIR HFA 108 (90 Base) MCG/ACT inhaler INL 2 PFS PO Q 6 H PRN  . Vitamin D, Cholecalciferol, 1000 units TABS Take 1,000 Units by mouth every morning.    Pulmonary Functions Testing Results:  02/12/2018: PFT: FEV1 0.93 L or 37% predicted, FVC 1.58 L or 45% predicted.  FEV1/FVC 59%.  Postbronchodilator there is response in FVC by 15%.  Indicating decreased air trapping.  Low lung volumes done at that time. 10/24/2019: Overnight oximetry showed desaturations to as low as 71% 2 L/min nocturnal  oxygen 12/03/2019: PFT: FEV1 1.71 L or 69% predicted, FVC 2.69 L or 78% predicted, FEV1/FVC 64%.  FVC postbronchodilator 3.0 L indicating decreased air trapping.  Air-trapping at 129% of predicted on lung volumes.  Total lung capacity normal 97%.  Diffusion capacity decreased at 44%.   Objective:   Physical Exam BP 132/70 (BP Location: Left Arm, Cuff Size: Normal)   Pulse 69   Temp 97.7 F (36.5 C) (Temporal)   Ht 5\' 5"  (1.651 m)   Wt 182 lb (82.6 kg)   SpO2 90% Comment: on RA  BMI 30.29 kg/m  GENERAL: Awake, alert, no acute respiratory distress.  Fully ambulatory. HEAD: Normocephalic, atraumatic.  EYES: Pupils equal, round, reactive to light.  No scleral icterus.  MOUTH: Masking requirements. NECK: Supple. No thyromegaly. No nodules. No JVD.  PULMONARY: Lungs clear to auscultation bilaterally. CARDIOVASCULAR: S1 and S2. Regular rate and rhythm.  No rub,  murmurs or gallops. GASTROINTESTINAL: Nondistended, benign. MUSCULOSKELETAL: No joint deformity, no clubbing, no edema.  NEUROLOGIC: No focal deficits.  Awake and alert, speech is fluent. SKIN: Intact,warm,dry, no overt rashes on limited exam. PSYCH: Mood and behavior are normal.  Was performed today, he did not desaturate past 90%.  Was able to maintain a comfortable pace.  No shortness of breath.     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   12/08/2019,  MD [place] [facility]  *This note was dictated using voice recognition software/Dragon.  Despite best efforts to proofread, errors can occur which can change the meaning.  Any change was purely unintentional.

## 2020-04-18 ENCOUNTER — Telehealth: Payer: Self-pay | Admitting: Pulmonary Disease

## 2020-04-18 NOTE — Telephone Encounter (Signed)
Rhonda please advise 

## 2020-04-18 NOTE — Telephone Encounter (Signed)
Per Tacey Ruiz at [facility], RT reached out to patient today at 3:18 pm to arrange.  Pt received message and called [employer] back. At that time [facility] had left and the person that answered the phone at [employer] stated that she didn't see an order for Travel POC.    I called patient back and advised patient that Betha Loa will contact him back tomorrow to arrange.  Pt voiced understanding and was advised that if he didn't hear anything to call us back.  Nothing else needed at this time. Rhonda J Cobb

## 2020-04-18 NOTE — Telephone Encounter (Signed)
CM sent to Henderson Newcomer and Charlton Amor with [employer] to inquire as to the status of Travel POC. Rhonda J Cobb

## 2020-04-26 ENCOUNTER — Other Ambulatory Visit: Payer: Self-pay | Admitting: Cardiovascular Disease

## 2020-04-27 ENCOUNTER — Telehealth: Payer: Self-pay | Admitting: Pulmonary Disease

## 2020-04-27 NOTE — Telephone Encounter (Signed)
Spoke with the pt and he states [facility] has an issue with the POC order that we sent and [facility] needs further information  I called [facility] and spoke with Deniece Portela  He states that the pt lives in [place] but has [employer] and this is an issue as well as the fact that the pt only uses the o2 with sleep  I spoke with the pt and notified him of this and he verbalized understanding

## 2020-06-17 ENCOUNTER — Telehealth: Payer: Self-pay | Admitting: Pulmonary Disease

## 2020-06-17 NOTE — Telephone Encounter (Signed)
Patient returning call.  [phone]

## 2020-06-17 NOTE — Telephone Encounter (Signed)
Yes that is fine

## 2020-06-17 NOTE — Telephone Encounter (Signed)
Pt is requesting a Rx for POC, as he is needing to use oxygen more throughout the day.  Per our records, pt is only prescribed night time oxygen. Pt is aware that an OV and qualifying walk test is needed prior to prescribing day time oxygen.  First available with Dr. Jayme Cloud is not until August, there is a slot on 07/05/2020. Pt is willing to go to [facility] and see a NP, however first available is not 07/07/2020.  Dr. Jayme Cloud please advise with recommendations. Would it be okay to use slot on 07/05/2020?

## 2020-06-17 NOTE — Telephone Encounter (Signed)
Lm for pt

## 2020-06-21 NOTE — Telephone Encounter (Signed)
Pt has been scheduled for 07/05/20 at 3:30. Nothing further is needed.

## 2020-07-05 ENCOUNTER — Ambulatory Visit: Payer: Medicare Other | Admitting: Pulmonary Disease

## 2020-07-05 ENCOUNTER — Encounter: Payer: Self-pay | Admitting: Pulmonary Disease

## 2020-07-05 ENCOUNTER — Other Ambulatory Visit: Payer: Self-pay

## 2020-07-05 VITALS — BP 138/82 | HR 79 | Temp 97.9°F | Ht 65.0 in | Wt 183.2 lb

## 2020-07-05 DIAGNOSIS — J9611 Chronic respiratory failure with hypoxia: Secondary | ICD-10-CM

## 2020-07-05 DIAGNOSIS — I255 Ischemic cardiomyopathy: Secondary | ICD-10-CM

## 2020-07-05 DIAGNOSIS — J449 Chronic obstructive pulmonary disease, unspecified: Secondary | ICD-10-CM

## 2020-07-05 DIAGNOSIS — I272 Pulmonary hypertension, unspecified: Secondary | ICD-10-CM

## 2020-07-05 NOTE — Patient Instructions (Signed)
Have qualified for oxygen during the day.  Prescription was sent to [facility].  Continue Breztri 2 inhalations twice a day and as needed rescue inhaler  We will see you in follow-up in 2 months time call sooner should any new difficulties arise

## 2020-07-05 NOTE — Progress Notes (Signed)
   Subjective:    Patient ID: Harold Parker, male    DOB: Feb 12, 1946, 74 y.o.   MRN: [id number]  HPI   Data: 02/12/2018: PFT: FEV1 0.93 L or 37% predicted, FVC 1.58 L or 45% predicted.  FEV1/FVC 59%.  Postbronchodilator there is response in FVC by 15%.  Indicating decreased air trapping.  Low lung volumes done at that time. 10/24/2019: Overnight oximetry showed desaturations to as low as 71% 2 L/min nocturnal oxygen  12/03/2019: PFT: FEV1 1.71 L or 69% predicted, FVC 2.69 L or 78% predicted, FEV1/FVC 64%.  FVC postbronchodilator 3.0 L indicating decreased air trapping.  Air-trapping at 129% of predicted on lung volumes.  Total lung capacity normal 97%.  Diffusion capacity decreased at 44%.  Review of Systems A 10 point review of systems was performed and it is as noted above otherwise negative.    Objective:   Physical Exam BP 138/82 (BP Location: Left Arm, Cuff Size: Normal)   Pulse 79   Temp 97.9 F (36.6 C) (Temporal)   Ht 5' 5 (1.651 m)   Wt 183 lb 3.2 oz (83.1 kg)   SpO2 90%   BMI 30.49 kg/m  GENERAL: Awake, alert, no acute respiratory distress.  Fully ambulatory. HEAD: Normocephalic, atraumatic.  EYES: Pupils equal, round, reactive to light.  No scleral icterus.  MOUTH: Masking requirements. NECK: Supple. No thyromegaly. No nodules. No JVD.  PULMONARY: Lungs clear to auscultation bilaterally. CARDIOVASCULAR: S1 and S2. Regular rate and rhythm.  No rub,  murmurs or gallops. GASTROINTESTINAL: Nondistended, benign. MUSCULOSKELETAL: No joint deformity, no clubbing, no edema.  NEUROLOGIC: No focal deficits.  Awake and alert, speech is fluent. SKIN: Intact,warm,dry, no overt rashes on limited exam. PSYCH: Mood and behavior are normal.   Ambulatory oximetry showed the patient qualifies for supplemental oxygen .  At less than 100 feet patient desaturated to 88% on room air subsequently placed on 2 L/min maintained oxygen  sat above 90% throughout.    Assessment & Plan:   1.  Stage 3 severe COPD by GOLD classification (HCC) (Primary) - AMB REFERRAL FOR DME  2. Chronic respiratory failure with hypoxia (HCC)  3. Ischemic cardiomyopathy  4. Pulmonary hypertension [facility])   Patient Instructions  Have qualified for oxygen  during the day.  Prescription was sent to [facility].  Continue Breztri  2 inhalations twice a day and as needed rescue inhaler  We will see you in follow-up in 2 months time call sooner should any new difficulties arise  Please note: late entry documentation due to logistical difficulties during COVID-19 pandemic. This note is filed for information purposes only, and is not intended to be used for billing, nor does it represent the full scope/nature of the visit in question. Please see any associated scanned media linked to date of encounter for additional pertinent information.

## 2020-09-12 ENCOUNTER — Other Ambulatory Visit: Payer: Self-pay

## 2020-09-12 ENCOUNTER — Encounter: Payer: Medicare Other | Attending: Pulmonary Disease | Admitting: *Deleted

## 2020-09-12 DIAGNOSIS — Z79899 Other long term (current) drug therapy: Secondary | ICD-10-CM | POA: Insufficient documentation

## 2020-09-12 DIAGNOSIS — E119 Type 2 diabetes mellitus without complications: Secondary | ICD-10-CM | POA: Insufficient documentation

## 2020-09-12 DIAGNOSIS — Z7984 Long term (current) use of oral hypoglycemic drugs: Secondary | ICD-10-CM | POA: Insufficient documentation

## 2020-09-12 DIAGNOSIS — I10 Essential (primary) hypertension: Secondary | ICD-10-CM | POA: Insufficient documentation

## 2020-09-12 DIAGNOSIS — Z951 Presence of aortocoronary bypass graft: Secondary | ICD-10-CM | POA: Insufficient documentation

## 2020-09-12 DIAGNOSIS — Z7982 Long term (current) use of aspirin: Secondary | ICD-10-CM | POA: Insufficient documentation

## 2020-09-12 DIAGNOSIS — J449 Chronic obstructive pulmonary disease, unspecified: Secondary | ICD-10-CM | POA: Insufficient documentation

## 2020-09-12 DIAGNOSIS — I2510 Atherosclerotic heart disease of native coronary artery without angina pectoris: Secondary | ICD-10-CM | POA: Insufficient documentation

## 2020-09-12 DIAGNOSIS — I252 Old myocardial infarction: Secondary | ICD-10-CM | POA: Insufficient documentation

## 2020-09-12 NOTE — Progress Notes (Signed)
Virtual orientation call completed today. he has an appointment on Date: [id number] for EP eval and gym Orientation.  Documentation of diagnosis can be found in [facility] Date: 04/14/2020.

## 2020-09-13 VITALS — Ht 65.0 in | Wt 182.9 lb

## 2020-09-13 DIAGNOSIS — I10 Essential (primary) hypertension: Secondary | ICD-10-CM | POA: Diagnosis not present

## 2020-09-13 DIAGNOSIS — J449 Chronic obstructive pulmonary disease, unspecified: Secondary | ICD-10-CM

## 2020-09-13 DIAGNOSIS — Z951 Presence of aortocoronary bypass graft: Secondary | ICD-10-CM | POA: Diagnosis not present

## 2020-09-13 DIAGNOSIS — I2510 Atherosclerotic heart disease of native coronary artery without angina pectoris: Secondary | ICD-10-CM | POA: Diagnosis not present

## 2020-09-13 DIAGNOSIS — I252 Old myocardial infarction: Secondary | ICD-10-CM | POA: Diagnosis not present

## 2020-09-13 DIAGNOSIS — Z7984 Long term (current) use of oral hypoglycemic drugs: Secondary | ICD-10-CM | POA: Diagnosis not present

## 2020-09-13 DIAGNOSIS — Z7982 Long term (current) use of aspirin: Secondary | ICD-10-CM | POA: Diagnosis not present

## 2020-09-13 DIAGNOSIS — E119 Type 2 diabetes mellitus without complications: Secondary | ICD-10-CM | POA: Diagnosis not present

## 2020-09-13 DIAGNOSIS — Z79899 Other long term (current) drug therapy: Secondary | ICD-10-CM | POA: Diagnosis not present

## 2020-09-13 NOTE — Patient Instructions (Signed)
Patient Instructions  Patient Details  Name: Harold Parker MRN: [id number] Date of Birth: 06-06-1946 Referring Provider:  Salena Saner, MD  Below are your personal goals for exercise, nutrition, and risk factors. Our goal is to help you stay on track towards obtaining and maintaining these goals. We will be discussing your progress on these goals with you throughout the program.  Initial Exercise Prescription:  Initial Exercise Prescription - 09/13/20 1400      Date of Initial Exercise RX and Referring Provider   Date 09/13/20    Referring Provider Jayme Cloud      Recumbant Bike   Level 1    RPM 60    Minutes 15    METs 1.8      NuStep   Level 1    SPM 80    Minutes 15    METs 1.8      Arm Ergometer   Level 1    RPM 30    Minutes 15    METs 1.8      Prescription Details   Frequency (times per week) 3    Duration Progress to 30 minutes of continuous aerobic without signs/symptoms of physical distress      Intensity   THRR 40-80% of Max Heartrate 104-131    Ratings of Perceived Exertion 11-13    Perceived Dyspnea 0-4      Resistance Training   Training Prescription Yes    Weight 3 lb    Reps 10-15           Exercise Goals: Frequency: Be able to perform aerobic exercise two to three times per week in program working toward 2-5 days per week of home exercise.  Intensity: Work with a perceived exertion of 11 (fairly light) - 15 (hard) while following your exercise prescription.  We will make changes to your prescription with you as you progress through the program.   Duration: Be able to do 30 to 45 minutes of continuous aerobic exercise in addition to a 5 minute warm-up and a 5 minute cool-down routine.   Nutrition Goals: Your personal nutrition goals will be established when you do your nutrition analysis with the dietician.  The following are general nutrition guidelines to follow: Cholesterol < 200mg /day Sodium < 1500mg /day Fiber: Men over 50 yrs -  30 grams per day  Personal Goals:  Personal Goals and Risk Factors at Admission - 09/13/20 1426      Core Components/Risk Factors/Patient Goals on Admission    Weight Management Yes;Weight Loss    Intervention Weight Management: Develop a combined nutrition and exercise program designed to reach desired caloric intake, while maintaining appropriate intake of nutrient and fiber, sodium and fats, and appropriate energy expenditure required for the weight goal.    Admit Weight 182 lb 14.4 oz (83 kg)    Goal Weight: Short Term 180 lb (81.6 kg)    Goal Weight: Long Term 175 lb (79.4 kg)    Expected Outcomes Short Term: Continue to assess and modify interventions until short term weight is achieved;Long Term: Adherence to nutrition and physical activity/exercise program aimed toward attainment of established weight goal;Weight Loss: Understanding of general recommendations for a balanced deficit meal plan, which promotes 1-2 lb weight loss per week and includes a negative energy balance of [phone] kcal/d    Diabetes Yes    Intervention Provide education about signs/symptoms and action to take for hypo/hyperglycemia.;Provide education about proper nutrition, including hydration, and aerobic/resistive exercise prescription along with prescribed medications to  achieve blood glucose in normal ranges: Fasting glucose 65-99 mg/dL    Expected Outcomes Short Term: Participant verbalizes understanding of the signs/symptoms and immediate care of hyper/hypoglycemia, proper foot care and importance of medication, aerobic/resistive exercise and nutrition plan for blood glucose control.;Long Term: Attainment of HbA1C < 7%.    Hypertension Yes    Intervention Provide education on lifestyle modifcations including regular physical activity/exercise, weight management, moderate sodium restriction and increased consumption of fresh fruit, vegetables, and low fat dairy, alcohol moderation, and smoking cessation.;Monitor  prescription use compliance.    Expected Outcomes Short Term: Continued assessment and intervention until BP is < 140/38mm HG in hypertensive participants. < 130/18mm HG in hypertensive participants with diabetes, heart failure or chronic kidney disease.;Long Term: Maintenance of blood pressure at goal levels.    Lipids Yes    Intervention Provide education and support for participant on nutrition & aerobic/resistive exercise along with prescribed medications to achieve LDL 70mg , HDL >40mg .    Expected Outcomes Short Term: Participant states understanding of desired cholesterol values and is compliant with medications prescribed. Participant is following exercise prescription and nutrition guidelines.;Long Term: Cholesterol controlled with medications as prescribed, with individualized exercise RX and with personalized nutrition plan. Value goals: LDL < 70mg , HDL > 40 mg.    Personal Goal Other Yes           Tobacco Use Initial Evaluation: Social History   Tobacco Use  Smoking Status Former Smoker  . Packs/day: 1.00  . Years: 40.00  . Pack years: 40.00  . Types: Cigarettes  . Quit date: 02/02/2018  . Years since quitting: 2.6  Smokeless Tobacco Never Used  Tobacco Comment   Quit 01/2018 - on 05/01/2018 talked to Issai about Rel[employer] concerns. He ststaes that he has no taste for tobacco since he quit in Feb.    Exercise Goals and Review:  Exercise Goals    Row Name 09/13/20 1421             Exercise Goals   Increase Physical Activity Yes       Intervention Provide advice, education, support and counseling about physical activity/exercise needs.;Develop an individualized exercise prescription for aerobic and resistive training based on initial evaluation findings, risk stratification, comorbidities and participant's personal goals.       Expected Outcomes Short Term: Attend rehab on a regular basis to increase amount of physical activity.;Long Term: Add in home exercise to make  exercise part of routine and to increase amount of physical activity.;Long Term: Exercising regularly at least 3-5 days a week.       Increase Strength and Stamina Yes       Intervention Provide advice, education, support and counseling about physical activity/exercise needs.;Develop an individualized exercise prescription for aerobic and resistive training based on initial evaluation findings, risk stratification, comorbidities and participant's personal goals.       Expected Outcomes Short Term: Increase workloads from initial exercise prescription for resistance, speed, and METs.;Short Term: Perform resistance training exercises routinely during rehab and add in resistance training at home;Long Term: Improve cardiorespiratory fitness, muscular endurance and strength as measured by increased METs and functional capacity (09/15/20)       Able to understand and use rate of perceived exertion (RPE) scale Yes       Intervention Provide education and explanation on how to use RPE scale       Expected Outcomes Short Term: Able to use RPE daily in rehab to express subjective intensity level;Long Term:  Able  to use RPE to guide intensity level when exercising independently       Able to understand and use Dyspnea scale Yes       Intervention Provide education and explanation on how to use Dyspnea scale       Expected Outcomes Short Term: Able to use Dyspnea scale daily in rehab to express subjective sense of shortness of breath during exertion;Long Term: Able to use Dyspnea scale to guide intensity level when exercising independently       Knowledge and understanding of Target Heart Rate Range (THRR) Yes       Intervention Provide education and explanation of THRR including how the numbers were predicted and where they are located for reference       Expected Outcomes Short Term: Able to state/look up THRR;Short Term: Able to use daily as guideline for intensity in rehab;Long Term: Able to use THRR to govern  intensity when exercising independently       Able to check pulse independently Yes       Intervention Provide education and demonstration on how to check pulse in carotid and radial arteries.;Review the importance of being able to check your own pulse for safety during independent exercise       Expected Outcomes Short Term: Able to explain why pulse checking is important during independent exercise;Long Term: Able to check pulse independently and accurately       Understanding of Exercise Prescription Yes       Intervention Provide education, explanation, and written materials on patient's individual exercise prescription       Expected Outcomes Short Term: Able to explain program exercise prescription;Long Term: Able to explain home exercise prescription to exercise independently

## 2020-09-13 NOTE — Progress Notes (Signed)
Pulmonary Individual Treatment Plan  Patient Details  Name: Harold Parker MRN: [id number] Date of Birth: 1946-08-07 Referring Provider:     Pulmonary Rehab from 09/13/2020 in [facility] Cardiac and Pulmonary Rehab  Referring Provider Patsey Berthold      Initial Encounter Date:    Pulmonary Rehab from 09/13/2020 in [facility] Cardiac and Pulmonary Rehab  Date 09/13/20      Visit Diagnosis: Chronic obstructive pulmonary disease, unspecified COPD type ([place])  Patient's Home Medications on Admission:  Current Outpatient Medications:  .  acetaminophen (TYLENOL) 500 MG tablet, Take 1,000 mg by mouth daily as needed for moderate pain or headache., Disp: , Rfl:  .  ALPRAZolam (XANAX) 0.5 MG tablet, Take 0.5 mg by mouth 2 (two) times daily. , Disp: , Rfl:  .  amLODipine (NORVASC) 5 MG tablet, Take 5 mg by mouth daily., Disp: , Rfl:  .  Ascorbic Acid (VITAMIN C) 1000 MG tablet, Take 1,000 mg by mouth daily., Disp: , Rfl:  .  aspirin EC 81 MG tablet, Take 81 mg by mouth daily., Disp: , Rfl:  .  atorvastatin (LIPITOR) 80 MG tablet, Take 80 mg by mouth daily., Disp: , Rfl:  .  Budeson-Glycopyrrol-Formoterol (BREZTRI AEROSPHERE) 160-9-4.8 MCG/ACT AERO, Inhale 2 puffs into the lungs 2 (two) times daily., Disp: 10.7 g, Rfl: 11 .  esomeprazole (NEXIUM) 40 MG capsule, Take 40 mg by mouth at bedtime. , Disp: , Rfl:  .  ezetimibe (ZETIA) 10 MG tablet, Take 10 mg by mouth at bedtime. , Disp: , Rfl:  .  folic acid (FOLVITE) 572 MCG tablet, Take 400 mcg by mouth every evening., Disp: , Rfl:  .  furosemide (LASIX) 40 MG tablet, TAKE 1 TABLET BY MOUTH EVERY DAY, Disp: 90 tablet, Rfl: 2 .  glipiZIDE (GLUCOTROL) 5 MG tablet, Take 0.5 tablets (2.5 mg total) by mouth daily before breakfast. (Patient taking differently: Take 5 mg by mouth See admin instructions. Take 5 mg in the morning and take a second 5 mg dose at night on Mon, Wed, and Fri), Disp: 30 tablet, Rfl: 1 .  ipratropium-albuterol (DUONEB) 0.5-2.5 (3) MG/3ML SOLN, Take 3  mLs by nebulization every 6 (six) hours as needed. (Patient taking differently: Take 3 mLs by nebulization every 6 (six) hours as needed (shortness of breath). ), Disp: 360 mL, Rfl: 1 .  losartan (COZAAR) 100 MG tablet, TAKE 1 TABLET BY MOUTH EVERY DAY Please call to schedule appointment for further refills. Thank you! (Patient taking differently: 50 mg. TAKE 1 TABLET BY MOUTH EVERY DAY Please call to schedule appointment for further refills. Thank you!), Disp: 90 tablet, Rfl: 0 .  Melatonin 10 MG CAPS, Take 10 mg by mouth at bedtime., Disp: , Rfl:  .  metFORMIN (GLUCOPHAGE) 1000 MG tablet, TAKE 1 TABLET BY MOUTH TWICE DAILY WITH A MEAL (Patient taking differently: Take 1,000 mg by mouth 2 (two) times daily. ), Disp: 60 tablet, Rfl: 0 .  metoprolol succinate (TOPROL-XL) 100 MG 24 hr tablet, TAKE 1 TABLET BY MOUTH EVERY DAY WITH OR IMMEDIATELY FOLLOWING A MEAL, Disp: 90 tablet, Rfl: 1 .  PROAIR HFA 108 (90 Base) MCG/ACT inhaler, INL 2 PFS PO Q 6 H PRN, Disp: , Rfl:  .  Vitamin D, Cholecalciferol, 1000 units TABS, Take 1,000 Units by mouth every morning. , Disp: , Rfl:   Past Medical History: Past Medical History:  Diagnosis Date  . Bilateral carotid bruits    a. 01/2018 U/S: < 50% bilat ICA stenosis.  Marland Kitchen CAD (coronary  artery disease)    a. 1998 s/p MI and BMS [facility], [place]); b. 1999 redo PCI/rotablator in setting of what sounds like ISR;  c. Multiple stress tests over the years - last ~ 2017, reportedly nl; d. 01/2018 NSTEMI/Cath: LM 5m/d, LAD 50p, 40p/m, D1 60ost, OM1 95, RCA 100ost/p w/ L->R collats, EF 45%; e. s/p 3V CABG 02/19/18 (LIMA-LAD, VG-D1, VG-OM)  . Chronic lower back pain   . COPD (chronic obstructive pulmonary disease) ([place])   . GIB (gastrointestinal bleeding)    a. 02/2018 GIB and anemia w/ Hgb of 4.7 on presentation; b. 03/2018 EGD: 2 nonbleeding duodenal ulcers.  Marland Kitchen HTN (hypertension)   . Hypercholesteremia   . Ischemic cardiomyopathy    a. 01/2018 Echo: EF 40-45%,  mid-apicalanteroseptal, ant, apical sev HK, mod apicalinferior HK. Gr2 DD. Mod AS, mild MR, mod dil LA, PASP 55mmHg.  . Moderate aortic stenosis    a. 01/2018 Echo: Mod AS, mean grad (S) 63mmHg, Valve area (VTI) 1.06 cm^2, (Vmax) 1.27 cm^2; b. 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  . Myocardial infarction ([place]) ~ 1998/1999  . 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 02/19/2018 AVR: 25 mm Edwards Inspiris Resilia stented bovine pericardial tissue valve  . 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 Tobacco abuse   . Type II diabetes mellitus (H[facility])     Tobacco Use: Social History   Tobacco Use  Smoking Status Former Smoker  . Packs/day: 1.00  . Years: 40.00  . Pack years: 40.00  . Types: Cigarettes  . Quit date: 02/02/2018  . Years since quitting: 2.6  Smokeless Tobacco Never Used  Tobacco Comment   Quit 01/2018 - on 05/01/2018 talked to Kalee about Relaspe concerns. He ststaes that he has no taste for tobacco since he quit in Feb.    Labs: Recent Review Flowsheet Data    Labs for ITP Cardiac and Pulmonary Rehab Latest Ref Rng & Units 02/19/2018 02/19/2018 02/20/2018 02/20/2018 03/27/2018   Cholestrol 100 - 199 mg/dL - - - - 102   LDLCALC 0 - 99 mg/dL - - - - 50   HDL >39 mg/dL - - - - 35(L)   Trlycerides 0 - 149 mg/dL - - - - 84   Hemoglobin A1c 4.8 - 5.6 % - - - - -   PHART 7.35 - 7.45 - 7.335(L) 7.361 7.331(L) -   PCO2ART 32 - 48 mmHg - 40.2 40.1 41.0 -   HCO3 20.0 - 28.0 mmol/L - 21.2 22.5 21.5 -   TCO2 22 - 32 mmol/L $RemoveB'24 22 24 23 'PUwbAiWW$ -   ACIDBASEDEF 0.0 - 2.0 mmol/L - 4.0(H) 2.0 4.0(H) -   O2SAT % - 97.0 97.0 95.0 -       Pulmonary Assessment Scores:  Pulmonary Assessment Scores    Row Name 09/13/20 1422         ADL UCSD   SOB Score total 21     Rest 0     Walk 2     Stairs 3     Bath 2     Dress 0     Shop 0       CAT Score   CAT Score 16       mMRC Score   mMRC Score 3             UCSD: Self-administered rating of dyspnea associated with activities of  daily living (ADLs) 6-point scale (0 = "not at all" to 5 = "maximal or unable to do because of breathlessness")  Scoring Scores range from 0 to 120.  Minimally important difference is 5 units  CAT: CAT can identify the health impairment of COPD patients and is better correlated with disease progression.  CAT has a scoring range of zero to 40. The CAT score is classified into four groups of low (less than 10), medium (10 - 20), high (21-30) and very high (31-40) based on the impact level of disease on health status. A CAT score over 10 suggests significant symptoms.  A worsening CAT score could be explained by an exacerbation, poor medication adherence, poor inhaler technique, or progression of COPD or comorbid conditions.  CAT MCID is 2 points  mMRC: mMRC (Modified Medical Research Council) Dyspnea Scale is used to assess the degree of baseline functional disability in patients of respiratory disease due to dyspnea. No minimal important difference is established. A decrease in score of 1 point or greater is considered a positive change.   Pulmonary Function Assessment:   Exercise Target Goals: Exercise Program Goal: Individual exercise prescription set using results from initial 6 min walk test and THRR while considering  patient's activity barriers and safety.   Exercise Prescription Goal: Initial exercise prescription builds to 30-45 minutes a day of aerobic activity, 2-3 days per week.  Home exercise guidelines will be given to patient during program as part of exercise prescription that the participant will acknowledge.  Education: Aerobic Exercise & Resistance Training: - Gives group verbal and written instruction on the various components of exercise. Focuses on aerobic and resistive training programs and the benefits of this training and how to safely progress through these programs..   Cardiac Rehab from  09/02/2018 in [facility] Cardiac and Pulmonary Rehab  Date 07/24/18  Educator AS  Instruction Review Code 1- Verbalizes Understanding      Education: Exercise & Equipment Safety: - Individual verbal instruction and demonstration of equipment use and safety with use of the equipment.   Pulmonary Rehab from 09/13/2020 in [facility] Cardiac and Pulmonary Rehab  Date 09/13/20  Educator AS  Instruction Review Code 1- Verbalizes Understanding      Education: Exercise Physiology & General Exercise Guidelines: - Group verbal and written instruction with models to review the exercise physiology of the cardiovascular system and associated critical values. Provides general exercise guidelines with specific guidelines to those with heart or lung disease.    Cardiac Rehab from 09/02/2018 in [facility] Cardiac and Pulmonary Rehab  Date 05/20/18  Educator AS  Instruction Review Code 4- No Evidence of Learning  [charting in error]      Education: Flexibility, Balance, Mind/Body Relaxation: Provides group verbal/written instruction on the benefits of flexibility and balance training, including mind/body exercise modes such as yoga, pilates and tai chi.  Demonstration and skill practice provided.   Cardiac Rehab from 09/02/2018 in [facility] Cardiac and Pulmonary Rehab  Date 07/29/18  Educator AS  Instruction Review Code 1- Verbalizes Understanding      Activity Barriers & Risk Stratification:  Activity Barriers & Cardiac Risk Stratification - 09/12/20 1113      Activity Barriers & Cardiac Risk Stratification   Activity Barriers Shortness of Breath           6 Minute Walk:  6 Minute Walk    Row Name 09/13/20 1415         6 Minute Walk   Phase Initial  Distance 815 feet     Walk Time 5.5 minutes     # of Rest Breaks 3     MPH 1.7     METS 1.8     RPE 16     Perceived Dyspnea  3     VO2 Peak 6.2     Symptoms Yes (comment)     Comments SOB     Resting HR 78 bpm     Resting BP 140/74     Resting  Oxygen Saturation  91 %     Exercise Oxygen Saturation  during 6 min walk 84 %     Max Ex. HR 104 bpm     Max Ex. BP 158/64     2 Minute Post BP 138/76       Interval HR   1 Minute HR 85     2 Minute HR 99     3 Minute HR 101     4 Minute HR 104     5 Minute HR 101     6 Minute HR 103     2 Minute Post HR 96     Interval Heart Rate? Yes       Interval Oxygen   Interval Oxygen? Yes     Baseline Oxygen Saturation % 91 %     1 Minute Oxygen Saturation % 85 %     1 Minute Liters of Oxygen 2 L     2 Minute Oxygen Saturation % 84 %     2 Minute Liters of Oxygen 2 L     3 Minute Oxygen Saturation % 84 %     3 Minute Liters of Oxygen 2 L     4 Minute Oxygen Saturation % 85 %     4 Minute Liters of Oxygen 2 L     5 Minute Oxygen Saturation % 85 %     5 Minute Liters of Oxygen 2 L     6 Minute Oxygen Saturation % 86 %     6 Minute Liters of Oxygen 2 L     2 Minute Post Oxygen Saturation % 85 %     2 Minute Post Liters of Oxygen 2 L           Oxygen Initial Assessment:  Oxygen Initial Assessment - 09/12/20 1111      Home Oxygen   Home Oxygen Device Home Concentrator;Portable Concentrator    Sleep Oxygen Prescription Continuous    Liters per minute 2    Home Exercise Oxygen Prescription None    Home Resting Oxygen Prescription Continuous   as needed   Liters per minute 2      Intervention   Short Term Goals To learn and exhibit compliance with exercise, home and travel O2 prescription;To learn and understand importance of monitoring SPO2 with pulse oximeter and demonstrate accurate use of the pulse oximeter.;To learn and understand importance of maintaining oxygen saturations>88%;To learn and demonstrate proper pursed lip breathing techniques or other breathing techniques.;To learn and demonstrate proper use of respiratory medications    Long  Term Goals Exhibits compliance with exercise, home and travel O2 prescription;Verbalizes importance of monitoring SPO2 with pulse  oximeter and return demonstration;Maintenance of O2 saturations>88%;Exhibits proper breathing techniques, such as pursed lip breathing or other method taught during program session;Compliance with respiratory medication;Demonstrates proper use of MDI's           Oxygen Re-Evaluation:   Oxygen Discharge (Final Oxygen Re-Evaluation):   Initial  Exercise Prescription:  Initial Exercise Prescription - 09/13/20 1400      Date of Initial Exercise RX and Referring Provider   Date 09/13/20    Referring Provider Patsey Berthold      Recumbant Bike   Level 1    RPM 60    Minutes 15    METs 1.8      NuStep   Level 1    SPM 80    Minutes 15    METs 1.8      Arm Ergometer   Level 1    RPM 30    Minutes 15    METs 1.8      Prescription Details   Frequency (times per week) 3    Duration Progress to 30 minutes of continuous aerobic without signs/symptoms of physical distress      Intensity   THRR 40-80% of Max Heartrate 104-131    Ratings of Perceived Exertion 11-13    Perceived Dyspnea 0-4      Resistance Training   Training Prescription Yes    Weight 3 lb    Reps 10-15           Perform Capillary Blood Glucose checks as needed.  Exercise Prescription Changes:  Exercise Prescription Changes    Row Name 09/13/20 1400             Response to Exercise   Blood Pressure (Admit) 140/74       Blood Pressure (Exercise) 158/64       Blood Pressure (Exit) 138/76       Heart Rate (Admit) 78 bpm       Heart Rate (Exercise) 104 bpm       Heart Rate (Exit) 96 bpm       Oxygen Saturation (Admit) 91 %       Oxygen Saturation (Exercise) 84 %       Oxygen Saturation (Exit) 85 %       Rating of Perceived Exertion (Exercise) 16       Symptoms SOB              Exercise Comments:   Exercise Goals and Review:  Exercise Goals    Row Name 09/13/20 1421             Exercise Goals   Increase Physical Activity Yes       Intervention Provide advice, education, support and  counseling about physical activity/exercise needs.;Develop an individualized exercise prescription for aerobic and resistive training based on initial evaluation findings, risk stratification, comorbidities and participant's personal goals.       Expected Outcomes Short Term: Attend rehab on a regular basis to increase amount of physical activity.;Long Term: Add in home exercise to make exercise part of routine and to increase amount of physical activity.;Long Term: Exercising regularly at least 3-5 days a week.       Increase Strength and Stamina Yes       Intervention Provide advice, education, support and counseling about physical activity/exercise needs.;Develop an individualized exercise prescription for aerobic and resistive training based on initial evaluation findings, risk stratification, comorbidities and participant's personal goals.       Expected Outcomes Short Term: Increase workloads from initial exercise prescription for resistance, speed, and METs.;Short Term: Perform resistance training exercises routinely during rehab and add in resistance training at home;Long Term: Improve cardiorespiratory fitness, muscular endurance and strength as measured by increased METs and functional capacity (6MWT)       Able to understand and  use rate of perceived exertion (RPE) scale Yes       Intervention Provide education and explanation on how to use RPE scale       Expected Outcomes Short Term: Able to use RPE daily in rehab to express subjective intensity level;Long Term:  Able to use RPE to guide intensity level when exercising independently       Able to understand and use Dyspnea scale Yes       Intervention Provide education and explanation on how to use Dyspnea scale       Expected Outcomes Short Term: Able to use Dyspnea scale daily in rehab to express subjective sense of shortness of breath during exertion;Long Term: Able to use Dyspnea scale to guide intensity level when exercising independently        Knowledge and understanding of Target Heart Rate Range (THRR) Yes       Intervention Provide education and explanation of THRR including how the numbers were predicted and where they are located for reference       Expected Outcomes Short Term: Able to state/look up THRR;Short Term: Able to use daily as guideline for intensity in rehab;Long Term: Able to use THRR to govern intensity when exercising independently       Able to check pulse independently Yes       Intervention Provide education and demonstration on how to check pulse in carotid and radial arteries.;Review the importance of being able to check your own pulse for safety during independent exercise       Expected Outcomes Short Term: Able to explain why pulse checking is important during independent exercise;Long Term: Able to check pulse independently and accurately       Understanding of Exercise Prescription Yes       Intervention Provide education, explanation, and written materials on patient's individual exercise prescription       Expected Outcomes Short Term: Able to explain program exercise prescription;Long Term: Able to explain home exercise prescription to exercise independently              Exercise Goals Re-Evaluation :   Discharge Exercise Prescription (Final Exercise Prescription Changes):  Exercise Prescription Changes - 09/13/20 1400      Response to Exercise   Blood Pressure (Admit) 140/74    Blood Pressure (Exercise) 158/64    Blood Pressure (Exit) 138/76    Heart Rate (Admit) 78 bpm    Heart Rate (Exercise) 104 bpm    Heart Rate (Exit) 96 bpm    Oxygen Saturation (Admit) 91 %    Oxygen Saturation (Exercise) 84 %    Oxygen Saturation (Exit) 85 %    Rating of Perceived Exertion (Exercise) 16    Symptoms SOB           Nutrition:  Target Goals: Understanding of nutrition guidelines, daily intake of sodium '1500mg'$ , cholesterol '200mg'$ , calories 30% from fat and 7% or less from saturated fats,  daily to have 5 or more servings of fruits and vegetables.  Education: Controlling Sodium/Reading Food Labels -Group verbal and written material supporting the discussion of sodium use in heart healthy nutrition. Review and explanation with models, verbal and written materials for utilization of the food label.   Cardiac Rehab from 09/02/2018 in [facility] Cardiac and Pulmonary Rehab  Date 05/13/18  Educator PI  Instruction Review Code 1- Verbalizes Understanding      Education: General Nutrition Guidelines/Fats and Fiber: -Group instruction provided by verbal, written material, models and posters to present the  general guidelines for heart healthy nutrition. Gives an explanation and review of dietary fats and fiber.   Cardiac Rehab from 09/02/2018 in [facility] Cardiac and Pulmonary Rehab  Date 08/26/18  Educator LB  Instruction Review Code 1- Verbalizes Understanding      Biometrics:  Pre Biometrics - 09/13/20 1422      Pre Biometrics   Height $Remov'5\' 5"'wLbQjJ$  (1.651 m)    Weight 182 lb 14.4 oz (83 kg)    BMI (Calculated) 30.44    Single Leg Stand 10 seconds            Nutrition Therapy Plan and Nutrition Goals:   Nutrition Assessments:  Nutrition Assessments - 09/13/20 1425      MEDFICTS Scores   Pre Score 65           MEDIFICTS Score Key:          ?70 Need to make dietary changes          40-70 Heart Healthy Diet         ? 40 Therapeutic Level Cholesterol Diet  Nutrition Goals Re-Evaluation:   Nutrition Goals Discharge (Final Nutrition Goals Re-Evaluation):   Psychosocial: Target Goals: Acknowledge presence or absence of significant depression and/or stress, maximize coping skills, provide positive support system. Participant is able to verbalize types and ability to use techniques and skills needed for reducing stress and depression.   Education: Depression - Provides group verbal and written instruction on the correlation between heart/lung disease and depressed mood,  treatment options, and the stigmas associated with seeking treatment.   Education: Sleep Hygiene -Provides group verbal and written instruction about how sleep can affect your health.  Define sleep hygiene, discuss sleep cycles and impact of sleep habits. Review good sleep hygiene tips.    Cardiac Rehab from 09/02/2018 in [facility] Cardiac and Pulmonary Rehab  Date 08/19/18  Educator [facility]  Instruction Review Code 1- Verbalizes Understanding  [Arrived late to class]      Education: Stress and Anxiety: - Provides group verbal and written instruction about the health risks of elevated stress and causes of high stress.  Discuss the correlation between heart/lung disease and anxiety and treatment options. Review healthy ways to manage with stress and anxiety.   Cardiac Rehab from 09/02/2018 in [facility] Cardiac and Pulmonary Rehab  Date 08/05/18  Educator [facility]  Instruction Review Code 1- Verbalizes Understanding      Initial Review & Psychosocial Screening:  Initial Psych Review & Screening - 09/12/20 1116      Initial Review   Current issues with Current Anxiety/Panic      Family Dynamics   Good Support System? Yes   Lolly Mustache, 6 years, family     Barriers   Psychosocial barriers to participate in program There are no identifiable barriers or psychosocial needs.;The patient should benefit from training in stress management and relaxation.;Psychosocial barriers identified (see note)      Screening Interventions   Interventions Encouraged to exercise    Expected Outcomes Short Term goal: Utilizing psychosocial counselor, staff and physician to assist with identification of specific Stressors or current issues interfering with healing process. Setting desired goal for each stressor or current issue identified.;Long Term Goal: Stressors or current issues are controlled or eliminated.;Short Term goal: Identification and review with participant of any Quality of Life or Depression concerns found by  scoring the questionnaire.;Long Term goal: The participant improves quality of Life and PHQ9 Scores as seen by post scores and/or verbalization of changes  Quality of Life Scores:  Scores of 19 and below usually indicate a poorer quality of life in these areas.  A difference of  2-3 points is a clinically meaningful difference.  A difference of 2-3 points in the total score of the Quality of Life Index has been associated with significant improvement in overall quality of life, self-image, physical symptoms, and general health in studies assessing change in quality of life.  PHQ-9: Recent Review Flowsheet Data    Depression screen [facility] 2/9 09/13/2020 08/26/2018 05/01/2018   Decreased Interest 0 0 0   Down, Depressed, Hopeless 0 0 0   PHQ - 2 Score 0 0 0   Altered sleeping 0 0 0   Tired, decreased energy 1 1 0   Change in appetite 0 - 0   Feeling bad or failure about yourself  0 0 0   Trouble concentrating 0 0 0   Moving slowly or fidgety/restless 0 0 0   Suicidal thoughts 0 0 0   PHQ-9 Score 1 1 0   Difficult doing work/chores Not difficult at all Not difficult at all Not difficult at all     Interpretation of Total Score  Total Score Depression Severity:  1-4 = Minimal depression, 5-9 = Mild depression, 10-14 = Moderate depression, 15-19 = Moderately severe depression, 20-27 = Severe depression   Psychosocial Evaluation and Intervention:  Psychosocial Evaluation - 09/12/20 1136      Psychosocial Evaluation & Interventions   Comments Jag doe not have any barriers to attending the program.  He remembers attending Cardiac Rehab several years ago and is looking forward to starting Pulmonary Rehab. He does get anxiety at time when his breathing is hard. He has learned to slow down, and reset to help get his breathing back to his normal. He lives with his Herma Carson and their 2 dogs. He has a great support system with Arbie Cookey and his family. He continues to be active, traveling  and getting out his bike. He has purchased his own portable oxygen concentrator for at home and traveling. He should do great in the program.    Expected Outcomes STG: Tawfiq will attend all scheduled sessions to gain the most from the program. LTG: Shaheed will see improvement in his ADL activity and be able to maintain this improvement after discharge.    Continue Psychosocial Services  Follow up required by staff           Psychosocial Re-Evaluation:   Psychosocial Discharge (Final Psychosocial Re-Evaluation):   Education: Education Goals: Education classes will be provided on a weekly basis, covering required topics. Participant will state understanding/return demonstration of topics presented.  Learning Barriers/Preferences:  Learning Barriers/Preferences - 09/12/20 1121      Learning Barriers/Preferences   Learning Barriers None    Learning Preferences None           General Pulmonary Education Topics:  Infection Prevention: - Provides verbal and written material to individual with discussion of infection control including proper hand washing and proper equipment cleaning during exercise session.   Pulmonary Rehab from 09/13/2020 in [facility] Cardiac and Pulmonary Rehab  Date 09/13/20  Educator AS  Instruction Review Code 1- Verbalizes Understanding      Falls Prevention: - Provides verbal and written material to individual with discussion of falls prevention and safety.   Pulmonary Rehab from 09/13/2020 in [facility] Cardiac and Pulmonary Rehab  Date 09/13/20  Educator AS  Instruction Review Code 1- Verbalizes Understanding  Chronic Lung Diseases: - Group verbal and written instruction to review updates, respiratory medications, advancements in procedures and treatments. Discuss use of supplemental oxygen including available portable oxygen systems, continuous and intermittent flow rates, concentrators, personal use and safety guidelines. Review proper use of inhaler and  spacers. Provide informative websites for self-education.    Energy Conservation: - Provide group verbal and written instruction for methods to conserve energy, plan and organize activities. Instruct on pacing techniques, use of adaptive equipment and posture/positioning to relieve shortness of breath.   Triggers and Exacerbations: - Group verbal and written instruction to review types of environmental triggers and ways to prevent exacerbations. Discuss weather changes, air quality and the benefits of nasal washing. Review warning signs and symptoms to help prevent infections. Discuss techniques for effective airway clearance, coughing, and vibrations.   AED/CPR: - Group verbal and written instruction with the use of models to demonstrate the basic use of the AED with the basic ABC's of resuscitation.   Cardiac Rehab from 09/02/2018 in [facility] Cardiac and Pulmonary Rehab  Date 09/02/18  Educator [place]  Instruction Review Code 1- Verbalizes Understanding      Anatomy and Physiology of the Lungs: - Group verbal and written instruction with the use of models to provide basic lung anatomy and physiology related to function, structure and complications of lung disease.   Anatomy & Physiology of the Heart: - Group verbal and written instruction and models provide basic cardiac anatomy and physiology, with the coronary electrical and arterial systems. Review of Valvular disease and Heart Failure   Cardiac Rehab from 09/02/2018 in [facility] Cardiac and Pulmonary Rehab  Date 08/07/18  Educator CE  Instruction Review Code 1- Verbalizes Understanding      Cardiac Medications: - Group verbal and written instruction to review commonly prescribed medications for heart disease. Reviews the medication, class of the drug, and side effects.   Cardiac Rehab from 09/02/2018 in [facility] Cardiac and Pulmonary Rehab  Date 08/12/18  Educator SB  Instruction Review Code 1- Verbalizes Understanding       Other: -Provides group and verbal instruction on various topics (see comments)   Knowledge Questionnaire Score:  Knowledge Questionnaire Score - 09/13/20 1424      Knowledge Questionnaire Score   Pre Score 16/18            Core Components/Risk Factors/Patient Goals at Admission:  Personal Goals and Risk Factors at Admission - 09/13/20 1426      Core Components/Risk Factors/Patient Goals on Admission    Weight Management Yes;Weight Loss    Intervention Weight Management: Develop a combined nutrition and exercise program designed to reach desired caloric intake, while maintaining appropriate intake of nutrient and fiber, sodium and fats, and appropriate energy expenditure required for the weight goal.    Admit Weight 182 lb 14.4 oz (83 kg)    Goal Weight: Short Term 180 lb (81.6 kg)    Goal Weight: Long Term 175 lb (79.4 kg)    Expected Outcomes Short Term: Continue to assess and modify interventions until short term weight is achieved;Long Term: Adherence to nutrition and physical activity/exercise program aimed toward attainment of established weight goal;Weight Loss: Understanding of general recommendations for a balanced deficit meal plan, which promotes 1-2 lb weight loss per week and includes a negative energy balance of [phone]/d    Diabetes Yes    Intervention Provide education about signs/symptoms and action to take for hypo/hyperglycemia.;Provide education about proper nutrition, including hydration, and aerobic/resistive exercise prescription along  with prescribed medications to achieve blood glucose in normal ranges: Fasting glucose 65-99 mg/dL    Expected Outcomes Short Term: Participant verbalizes understanding of the signs/symptoms and immediate care of hyper/hypoglycemia, proper foot care and importance of medication, aerobic/resistive exercise and nutrition plan for blood glucose control.;Long Term: Attainment of HbA1C < 7%.    Hypertension Yes    Intervention  Provide education on lifestyle modifcations including regular physical activity/exercise, weight management, moderate sodium restriction and increased consumption of fresh fruit, vegetables, and low fat dairy, alcohol moderation, and smoking cessation.;Monitor prescription use compliance.    Expected Outcomes Short Term: Continued assessment and intervention until BP is < 140/23mm HG in hypertensive participants. < 130/35mm HG in hypertensive participants with diabetes, heart failure or chronic kidney disease.;Long Term: Maintenance of blood pressure at goal levels.    Lipids Yes    Intervention Provide education and support for participant on nutrition & aerobic/resistive exercise along with prescribed medications to achieve LDL '70mg'$ , HDL >$Remo'40mg'pOFOc$ .    Expected Outcomes Short Term: Participant states understanding of desired cholesterol values and is compliant with medications prescribed. Participant is following exercise prescription and nutrition guidelines.;Long Term: Cholesterol controlled with medications as prescribed, with individualized exercise RX and with personalized nutrition plan. Value goals: LDL < $Rem'70mg'qAbq$ , HDL > 40 mg.    Personal Goal Other Yes           Education:Diabetes - Individual verbal and written instruction to review signs/symptoms of diabetes, desired ranges of glucose level fasting, after meals and with exercise. Acknowledge that pre and post exercise glucose checks will be done for 3 sessions at entry of program.   Cardiac Rehab from 09/02/2018 in [facility] Cardiac and Pulmonary Rehab  Date 05/01/18  Educator [facility]  Instruction Review Code 1- [employer] Understanding      Education: Know Your Numbers and Risk Factors: -Group verbal and written instruction about important numbers in your health.  Discussion of what are risk factors and how they play a role in the disease process.  Review of Cholesterol, Blood Pressure, Diabetes, and BMI and the role they play in your overall  health.   Cardiac Rehab from 09/02/2018 in [facility] Cardiac and Pulmonary Rehab  Date 07/31/18  Educator CE  Instruction Review Code 1- Verbalizes Understanding      Core Components/Risk Factors/Patient Goals Review:    Core Components/Risk Factors/Patient Goals at Discharge (Final Review):    ITP Comments:  ITP Comments    Row Name 09/12/20 1129           ITP Comments Virtual orientation call completed today. he has an appointment on Date: [id number] for EP eval and gym Orientation.  Documentation of diagnosis can be found in [facility] Date: 04/14/2020.              Comments: initial ITP

## 2020-09-19 ENCOUNTER — Encounter: Payer: Medicare Other | Attending: Pulmonary Disease | Admitting: *Deleted

## 2020-09-19 ENCOUNTER — Other Ambulatory Visit: Payer: Self-pay

## 2020-09-19 DIAGNOSIS — J449 Chronic obstructive pulmonary disease, unspecified: Secondary | ICD-10-CM | POA: Insufficient documentation

## 2020-09-19 LAB — GLUCOSE, CAPILLARY
Glucose-Capillary: 136 mg/dL — ABNORMAL HIGH (ref 70–99)
Glucose-Capillary: 102 mg/dL — ABNORMAL HIGH (ref 70–99)

## 2020-09-19 NOTE — Progress Notes (Signed)
Daily Session Note  Patient Details  Name: Harold Parker MRN: [id number] Date of Birth: Feb 22, 1946 Referring Provider:     Pulmonary Rehab from 09/13/2020 in [facility] Cardiac and Pulmonary Rehab  Referring Provider Patsey Berthold      Encounter Date: 09/19/2020  Check In:  Session Check In - 09/19/20 1124      Check-In   Supervising physician immediately available to respond to emergencies See telemetry face sheet for immediately available ER MD    Location [facility]-Cardiac & Pulmonary Rehab    Staff Present Renita Papa, RN BSN;Joseph Lou Miner, [place] Exercise Physiologist;Kelly Amedeo Plenty, BS, ACSM CEP, Exercise Physiologist;Amanda Oletta Darter, [place], ACSM CEP, Exercise Physiologist    Virtual Visit No    Medication changes reported     No    Fall or balance concerns reported    No    Warm-up and Cool-down Performed on first and last piece of equipment    Resistance Training Performed Yes    VAD Patient? No    PAD/SET Patient? No      Pain Assessment   Currently in Pain? No/denies              Social History   Tobacco Use  Smoking Status Former Smoker  . Packs/day: 1.00  . Years: 40.00  . Pack years: 40.00  . Types: Cigarettes  . Quit date: 02/02/2018  . Years since quitting: 2.6  Smokeless Tobacco Never Used  Tobacco Comment   Quit 01/2018 - on 05/01/2018 talked to Ignatius about Relaspe concerns. He ststaes that he has no taste for tobacco since he quit in Feb.    Goals Met:  Proper associated with RPD/PD & O2 Sat Independence with exercise equipment Exercise tolerated well No report of cardiac concerns or symptoms Strength training completed today  Goals Unmet:  Not Applicable  Comments: First full day of exercise!  Patient was oriented to gym and equipment including functions, settings, policies, and procedures.  Patient's individual exercise prescription and treatment plan were reviewed.  All starting workloads were established based on the results of the 6  minute walk test done at initial orientation visit.  The plan for exercise progression was also introduced and progression will be customized based on patient's performance and goals.     Dr. Emily Filbert is Medical Director for [facility].

## 2020-09-20 ENCOUNTER — Ambulatory Visit (INDEPENDENT_AMBULATORY_CARE_PROVIDER_SITE_OTHER): Payer: Medicare Other

## 2020-09-20 ENCOUNTER — Other Ambulatory Visit: Payer: Self-pay

## 2020-09-20 DIAGNOSIS — I739 Peripheral vascular disease, unspecified: Secondary | ICD-10-CM

## 2020-09-20 DIAGNOSIS — I359 Nonrheumatic aortic valve disorder, unspecified: Secondary | ICD-10-CM

## 2020-09-20 LAB — ECHOCARDIOGRAM COMPLETE
AR max vel: 1.77 cm2
AV Area VTI: 1.65 cm2
AV Area mean vel: 1.54 cm2
AV Mean grad: 9 mmHg
AV Peak grad: 14.4 mmHg
Ao pk vel: 1.9 m/s
Area-P 1/2: 2.37 cm2
Calc EF: 47.2 %
S' Lateral: 3.2 cm
Single Plane A2C EF: 43.4 %
Single Plane A4C EF: 51.7 %

## 2020-09-20 MED ORDER — PERFLUTREN LIPID MICROSPHERE
1.0000 mL | INTRAVENOUS | Status: AC | PRN
  Administered 2020-09-20: 2 mL via INTRAVENOUS

## 2020-09-21 ENCOUNTER — Encounter: Payer: Self-pay | Admitting: *Deleted

## 2020-09-21 ENCOUNTER — Encounter: Payer: Medicare Other | Admitting: *Deleted

## 2020-09-21 DIAGNOSIS — J449 Chronic obstructive pulmonary disease, unspecified: Secondary | ICD-10-CM

## 2020-09-21 LAB — GLUCOSE, CAPILLARY
Glucose-Capillary: 252 mg/dL — ABNORMAL HIGH (ref 70–99)
Glucose-Capillary: 202 mg/dL — ABNORMAL HIGH (ref 70–99)

## 2020-09-21 NOTE — Progress Notes (Signed)
Pulmonary Individual Treatment Plan  Patient Details  Name: Harold Parker MRN: [id number] Date of Birth: 1946-08-07 Referring Provider:     Pulmonary Rehab from 09/13/2020 in [facility] Cardiac and Pulmonary Rehab  Referring Provider Patsey Berthold      Initial Encounter Date:    Pulmonary Rehab from 09/13/2020 in [facility] Cardiac and Pulmonary Rehab  Date 09/13/20      Visit Diagnosis: Chronic obstructive pulmonary disease, unspecified COPD type ([place])  Patient's Home Medications on Admission:  Current Outpatient Medications:  .  acetaminophen (TYLENOL) 500 MG tablet, Take 1,000 mg by mouth daily as needed for moderate pain or headache., Disp: , Rfl:  .  ALPRAZolam (XANAX) 0.5 MG tablet, Take 0.5 mg by mouth 2 (two) times daily. , Disp: , Rfl:  .  amLODipine (NORVASC) 5 MG tablet, Take 5 mg by mouth daily., Disp: , Rfl:  .  Ascorbic Acid (VITAMIN C) 1000 MG tablet, Take 1,000 mg by mouth daily., Disp: , Rfl:  .  aspirin EC 81 MG tablet, Take 81 mg by mouth daily., Disp: , Rfl:  .  atorvastatin (LIPITOR) 80 MG tablet, Take 80 mg by mouth daily., Disp: , Rfl:  .  Budeson-Glycopyrrol-Formoterol (BREZTRI AEROSPHERE) 160-9-4.8 MCG/ACT AERO, Inhale 2 puffs into the lungs 2 (two) times daily., Disp: 10.7 g, Rfl: 11 .  esomeprazole (NEXIUM) 40 MG capsule, Take 40 mg by mouth at bedtime. , Disp: , Rfl:  .  ezetimibe (ZETIA) 10 MG tablet, Take 10 mg by mouth at bedtime. , Disp: , Rfl:  .  folic acid (FOLVITE) 572 MCG tablet, Take 400 mcg by mouth every evening., Disp: , Rfl:  .  furosemide (LASIX) 40 MG tablet, TAKE 1 TABLET BY MOUTH EVERY DAY, Disp: 90 tablet, Rfl: 2 .  glipiZIDE (GLUCOTROL) 5 MG tablet, Take 0.5 tablets (2.5 mg total) by mouth daily before breakfast. (Patient taking differently: Take 5 mg by mouth See admin instructions. Take 5 mg in the morning and take a second 5 mg dose at night on Mon, Wed, and Fri), Disp: 30 tablet, Rfl: 1 .  ipratropium-albuterol (DUONEB) 0.5-2.5 (3) MG/3ML SOLN, Take 3  mLs by nebulization every 6 (six) hours as needed. (Patient taking differently: Take 3 mLs by nebulization every 6 (six) hours as needed (shortness of breath). ), Disp: 360 mL, Rfl: 1 .  losartan (COZAAR) 100 MG tablet, TAKE 1 TABLET BY MOUTH EVERY DAY Please call to schedule appointment for further refills. Thank you! (Patient taking differently: 50 mg. TAKE 1 TABLET BY MOUTH EVERY DAY Please call to schedule appointment for further refills. Thank you!), Disp: 90 tablet, Rfl: 0 .  Melatonin 10 MG CAPS, Take 10 mg by mouth at bedtime., Disp: , Rfl:  .  metFORMIN (GLUCOPHAGE) 1000 MG tablet, TAKE 1 TABLET BY MOUTH TWICE DAILY WITH A MEAL (Patient taking differently: Take 1,000 mg by mouth 2 (two) times daily. ), Disp: 60 tablet, Rfl: 0 .  metoprolol succinate (TOPROL-XL) 100 MG 24 hr tablet, TAKE 1 TABLET BY MOUTH EVERY DAY WITH OR IMMEDIATELY FOLLOWING A MEAL, Disp: 90 tablet, Rfl: 1 .  PROAIR HFA 108 (90 Base) MCG/ACT inhaler, INL 2 PFS PO Q 6 H PRN, Disp: , Rfl:  .  Vitamin D, Cholecalciferol, 1000 units TABS, Take 1,000 Units by mouth every morning. , Disp: , Rfl:   Past Medical History: Past Medical History:  Diagnosis Date  . Bilateral carotid bruits    a. 01/2018 U/S: < 50% bilat ICA stenosis.  Marland Kitchen CAD (coronary  artery disease)    a. 1998 s/p MI and BMS [facility], [place]); b. 1999 redo PCI/rotablator in setting of what sounds like ISR;  c. Multiple stress tests over the years - last ~ 2017, reportedly nl; d. 01/2018 NSTEMI/Cath: LM 8m/d, LAD 50p, 40p/m, D1 60ost, OM1 95, RCA 100ost/p w/ L->R collats, EF 45%; e. s/p 3V CABG 02/19/18 (LIMA-LAD, VG-D1, VG-OM)  . Chronic lower back pain   . COPD (chronic obstructive pulmonary disease) ([place])   . GIB (gastrointestinal bleeding)    a. 02/2018 GIB and anemia w/ Hgb of 4.7 on presentation; b. 03/2018 EGD: 2 nonbleeding duodenal ulcers.  Marland Kitchen HTN (hypertension)   . Hypercholesteremia   . Ischemic cardiomyopathy    a. 01/2018 Echo: EF 40-45%,  mid-apicalanteroseptal, ant, apical sev HK, mod apicalinferior HK. Gr2 DD. Mod AS, mild MR, mod dil LA, PASP 54mmHg.  . Moderate aortic stenosis    a. 01/2018 Echo: Mod AS, mean grad (S) 16mmHg, Valve area (VTI) 1.06 cm^2, (Vmax) 1.27 cm^2; b. 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  . Myocardial infarction ([place]) ~ 1998/1999  . 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 02/19/2018 AVR: 25 mm Edwards Inspiris Resilia stented bovine pericardial tissue valve  . 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 Tobacco abuse   . Type II diabetes mellitus (H[facility])     Tobacco Use: Social History   Tobacco Use  Smoking Status Former Smoker  . Packs/day: 1.00  . Years: 40.00  . Pack years: 40.00  . Types: Cigarettes  . Quit date: 02/02/2018  . Years since quitting: 2.6  Smokeless Tobacco Never Used  Tobacco Comment   Quit 01/2018 - on 05/01/2018 talked to Kareen about Relaspe concerns. He ststaes that he has no taste for tobacco since he quit in Feb.    Labs: Recent Review Flowsheet Data    Labs for ITP Cardiac and Pulmonary Rehab Latest Ref Rng & Units 02/19/2018 02/19/2018 02/20/2018 02/20/2018 03/27/2018   Cholestrol 100 - 199 mg/dL - - - - 102   LDLCALC 0 - 99 mg/dL - - - - 50   HDL >39 mg/dL - - - - 35(L)   Trlycerides 0 - 149 mg/dL - - - - 84   Hemoglobin A1c 4.8 - 5.6 % - - - - -   PHART 7.35 - 7.45 - 7.335(L) 7.361 7.331(L) -   PCO2ART 32 - 48 mmHg - 40.2 40.1 41.0 -   HCO3 20.0 - 28.0 mmol/L - 21.2 22.5 21.5 -   TCO2 22 - 32 mmol/L $RemoveB'24 22 24 23 'RSNyzHON$ -   ACIDBASEDEF 0.0 - 2.0 mmol/L - 4.0(H) 2.0 4.0(H) -   O2SAT % - 97.0 97.0 95.0 -       Pulmonary Assessment Scores:  Pulmonary Assessment Scores    Row Name 09/13/20 1422         ADL UCSD   SOB Score total 21     Rest 0     Walk 2     Stairs 3     Bath 2     Dress 0     Shop 0       CAT Score   CAT Score 16       mMRC Score   mMRC Score 3             UCSD: Self-administered rating of dyspnea associated with activities of  daily living (ADLs) 6-point scale (0 = "not at all" to 5 = "maximal or unable to do because of breathlessness")  Scoring Scores range from 0 to 120.  Minimally important difference is 5 units  CAT: CAT can identify the health impairment of COPD patients and is better correlated with disease progression.  CAT has a scoring range of zero to 40. The CAT score is classified into four groups of low (less than 10), medium (10 - 20), high (21-30) and very high (31-40) based on the impact level of disease on health status. A CAT score over 10 suggests significant symptoms.  A worsening CAT score could be explained by an exacerbation, poor medication adherence, poor inhaler technique, or progression of COPD or comorbid conditions.  CAT MCID is 2 points  mMRC: mMRC (Modified Medical Research Council) Dyspnea Scale is used to assess the degree of baseline functional disability in patients of respiratory disease due to dyspnea. No minimal important difference is established. A decrease in score of 1 point or greater is considered a positive change.   Pulmonary Function Assessment:   Exercise Target Goals: Exercise Program Goal: Individual exercise prescription set using results from initial 6 min walk test and THRR while considering  patient's activity barriers and safety.   Exercise Prescription Goal: Initial exercise prescription builds to 30-45 minutes a day of aerobic activity, 2-3 days per week.  Home exercise guidelines will be given to patient during program as part of exercise prescription that the participant will acknowledge.  Education: Aerobic Exercise & Resistance Training: - Gives group verbal and written instruction on the various components of exercise. Focuses on aerobic and resistive training programs and the benefits of this training and how to safely progress through these programs..   Cardiac Rehab from  09/02/2018 in [facility] Cardiac and Pulmonary Rehab  Date 07/24/18  Educator AS  Instruction Review Code 1- Verbalizes Understanding      Education: Exercise & Equipment Safety: - Individual verbal instruction and demonstration of equipment use and safety with use of the equipment.   Pulmonary Rehab from 09/13/2020 in [facility] Cardiac and Pulmonary Rehab  Date 09/13/20  Educator AS  Instruction Review Code 1- Verbalizes Understanding      Education: Exercise Physiology & General Exercise Guidelines: - Group verbal and written instruction with models to review the exercise physiology of the cardiovascular system and associated critical values. Provides general exercise guidelines with specific guidelines to those with heart or lung disease.    Cardiac Rehab from 09/02/2018 in [facility] Cardiac and Pulmonary Rehab  Date 05/20/18  Educator AS  Instruction Review Code 4- No Evidence of Learning  [charting in error]      Education: Flexibility, Balance, Mind/Body Relaxation: Provides group verbal/written instruction on the benefits of flexibility and balance training, including mind/body exercise modes such as yoga, pilates and tai chi.  Demonstration and skill practice provided.   Cardiac Rehab from 09/02/2018 in [facility] Cardiac and Pulmonary Rehab  Date 07/29/18  Educator AS  Instruction Review Code 1- Verbalizes Understanding      Activity Barriers & Risk Stratification:  Activity Barriers & Cardiac Risk Stratification - 09/12/20 1113      Activity Barriers & Cardiac Risk Stratification   Activity Barriers Shortness of Breath           6 Minute Walk:  6 Minute Walk    Row Name 09/13/20 1415         6 Minute Walk   Phase Initial  Distance 815 feet     Walk Time 5.5 minutes     # of Rest Breaks 3     MPH 1.7     METS 1.8     RPE 16     Perceived Dyspnea  3     VO2 Peak 6.2     Symptoms Yes (comment)     Comments SOB     Resting HR 78 bpm     Resting BP 140/74     Resting  Oxygen Saturation  91 %     Exercise Oxygen Saturation  during 6 min walk 84 %     Max Ex. HR 104 bpm     Max Ex. BP 158/64     2 Minute Post BP 138/76       Interval HR   1 Minute HR 85     2 Minute HR 99     3 Minute HR 101     4 Minute HR 104     5 Minute HR 101     6 Minute HR 103     2 Minute Post HR 96     Interval Heart Rate? Yes       Interval Oxygen   Interval Oxygen? Yes     Baseline Oxygen Saturation % 91 %     1 Minute Oxygen Saturation % 85 %     1 Minute Liters of Oxygen 2 L     2 Minute Oxygen Saturation % 84 %     2 Minute Liters of Oxygen 2 L     3 Minute Oxygen Saturation % 84 %     3 Minute Liters of Oxygen 2 L     4 Minute Oxygen Saturation % 85 %     4 Minute Liters of Oxygen 2 L     5 Minute Oxygen Saturation % 85 %     5 Minute Liters of Oxygen 2 L     6 Minute Oxygen Saturation % 86 %     6 Minute Liters of Oxygen 2 L     2 Minute Post Oxygen Saturation % 85 %     2 Minute Post Liters of Oxygen 2 L           Oxygen Initial Assessment:  Oxygen Initial Assessment - 09/12/20 1111      Home Oxygen   Home Oxygen Device Home Concentrator;Portable Concentrator    Sleep Oxygen Prescription Continuous    Liters per minute 2    Home Exercise Oxygen Prescription None    Home Resting Oxygen Prescription Continuous   as needed   Liters per minute 2      Intervention   Short Term Goals To learn and exhibit compliance with exercise, home and travel O2 prescription;To learn and understand importance of monitoring SPO2 with pulse oximeter and demonstrate accurate use of the pulse oximeter.;To learn and understand importance of maintaining oxygen saturations>88%;To learn and demonstrate proper pursed lip breathing techniques or other breathing techniques.;To learn and demonstrate proper use of respiratory medications    Long  Term Goals Exhibits compliance with exercise, home and travel O2 prescription;Verbalizes importance of monitoring SPO2 with pulse  oximeter and return demonstration;Maintenance of O2 saturations>88%;Exhibits proper breathing techniques, such as pursed lip breathing or other method taught during program session;Compliance with respiratory medication;Demonstrates proper use of MDI's           Oxygen Re-Evaluation:  Oxygen Re-Evaluation    Row Name 09/19/20 1127  Home Oxygen   Home Oxygen Device Home Concentrator;Portable Concentrator       Sleep Oxygen Prescription Continuous       Liters per minute 2       Home Exercise Oxygen Prescription None       Home Resting Oxygen Prescription Continuous       Liters per minute 2         Goals/Expected Outcomes   Short Term Goals To learn and exhibit compliance with exercise, home and travel O2 prescription;To learn and understand importance of monitoring SPO2 with pulse oximeter and demonstrate accurate use of the pulse oximeter.;To learn and understand importance of maintaining oxygen saturations>88%;To learn and demonstrate proper pursed lip breathing techniques or other breathing techniques.;To learn and demonstrate proper use of respiratory medications       Long  Term Goals Exhibits compliance with exercise, home and travel O2 prescription;Verbalizes importance of monitoring SPO2 with pulse oximeter and return demonstration;Maintenance of O2 saturations>88%;Exhibits proper breathing techniques, such as pursed lip breathing or other method taught during program session;Compliance with respiratory medication;Demonstrates proper use of MDI's       Comments Reviewed PLB technique with pt.  Talked about how it works and it's importance in maintaining their exercise saturations.       Goals/Expected Outcomes Short: Become more profiecient at using PLB.   Long: Become independent at using PLB.              Oxygen Discharge (Final Oxygen Re-Evaluation):  Oxygen Re-Evaluation - 09/19/20 1127      Home Oxygen   Home Oxygen Device Home Concentrator;Portable  Concentrator    Sleep Oxygen Prescription Continuous    Liters per minute 2    Home Exercise Oxygen Prescription None    Home Resting Oxygen Prescription Continuous    Liters per minute 2      Goals/Expected Outcomes   Short Term Goals To learn and exhibit compliance with exercise, home and travel O2 prescription;To learn and understand importance of monitoring SPO2 with pulse oximeter and demonstrate accurate use of the pulse oximeter.;To learn and understand importance of maintaining oxygen saturations>88%;To learn and demonstrate proper pursed lip breathing techniques or other breathing techniques.;To learn and demonstrate proper use of respiratory medications    Long  Term Goals Exhibits compliance with exercise, home and travel O2 prescription;Verbalizes importance of monitoring SPO2 with pulse oximeter and return demonstration;Maintenance of O2 saturations>88%;Exhibits proper breathing techniques, such as pursed lip breathing or other method taught during program session;Compliance with respiratory medication;Demonstrates proper use of MDI's    Comments Reviewed PLB technique with pt.  Talked about how it works and it's importance in maintaining their exercise saturations.    Goals/Expected Outcomes Short: Become more profiecient at using PLB.   Long: Become independent at using PLB.           Initial Exercise Prescription:  Initial Exercise Prescription - 09/13/20 1400      Date of Initial Exercise RX and Referring Provider   Date 09/13/20    Referring Provider Patsey Berthold      Recumbant Bike   Level 1    RPM 60    Minutes 15    METs 1.8      NuStep   Level 1    SPM 80    Minutes 15    METs 1.8      Arm Ergometer   Level 1    RPM 30    Minutes 15    METs 1.8  Prescription Details   Frequency (times per week) 3    Duration Progress to 30 minutes of continuous aerobic without signs/symptoms of physical distress      Intensity   THRR 40-80% of Max Heartrate  104-131    Ratings of Perceived Exertion 11-13    Perceived Dyspnea 0-4      Resistance Training   Training Prescription Yes    Weight 3 lb    Reps 10-15           Perform Capillary Blood Glucose checks as needed.  Exercise Prescription Changes:  Exercise Prescription Changes    Row Name 09/13/20 1400             Response to Exercise   Blood Pressure (Admit) 140/74       Blood Pressure (Exercise) 158/64       Blood Pressure (Exit) 138/76       Heart Rate (Admit) 78 bpm       Heart Rate (Exercise) 104 bpm       Heart Rate (Exit) 96 bpm       Oxygen Saturation (Admit) 91 %       Oxygen Saturation (Exercise) 84 %       Oxygen Saturation (Exit) 85 %       Rating of Perceived Exertion (Exercise) 16       Symptoms SOB              Exercise Comments:   Exercise Goals and Review:  Exercise Goals    Row Name 09/13/20 1421             Exercise Goals   Increase Physical Activity Yes       Intervention Provide advice, education, support and counseling about physical activity/exercise needs.;Develop an individualized exercise prescription for aerobic and resistive training based on initial evaluation findings, risk stratification, comorbidities and participant's personal goals.       Expected Outcomes Short Term: Attend rehab on a regular basis to increase amount of physical activity.;Long Term: Add in home exercise to make exercise part of routine and to increase amount of physical activity.;Long Term: Exercising regularly at least 3-5 days a week.       Increase Strength and Stamina Yes       Intervention Provide advice, education, support and counseling about physical activity/exercise needs.;Develop an individualized exercise prescription for aerobic and resistive training based on initial evaluation findings, risk stratification, comorbidities and participant's personal goals.       Expected Outcomes Short Term: Increase workloads from initial exercise prescription  for resistance, speed, and METs.;Short Term: Perform resistance training exercises routinely during rehab and add in resistance training at home;Long Term: Improve cardiorespiratory fitness, muscular endurance and strength as measured by increased METs and functional capacity (6MWT)       Able to understand and use rate of perceived exertion (RPE) scale Yes       Intervention Provide education and explanation on how to use RPE scale       Expected Outcomes Short Term: Able to use RPE daily in rehab to express subjective intensity level;Long Term:  Able to use RPE to guide intensity level when exercising independently       Able to understand and use Dyspnea scale Yes       Intervention Provide education and explanation on how to use Dyspnea scale       Expected Outcomes Short Term: Able to use Dyspnea scale daily in rehab to express subjective sense  of shortness of breath during exertion;Long Term: Able to use Dyspnea scale to guide intensity level when exercising independently       Knowledge and understanding of Target Heart Rate Range (THRR) Yes       Intervention Provide education and explanation of THRR including how the numbers were predicted and where they are located for reference       Expected Outcomes Short Term: Able to state/look up THRR;Short Term: Able to use daily as guideline for intensity in rehab;Long Term: Able to use THRR to govern intensity when exercising independently       Able to check pulse independently Yes       Intervention Provide education and demonstration on how to check pulse in carotid and radial arteries.;Review the importance of being able to check your own pulse for safety during independent exercise       Expected Outcomes Short Term: Able to explain why pulse checking is important during independent exercise;Long Term: Able to check pulse independently and accurately       Understanding of Exercise Prescription Yes       Intervention Provide education,  explanation, and written materials on patient's individual exercise prescription       Expected Outcomes Short Term: Able to explain program exercise prescription;Long Term: Able to explain home exercise prescription to exercise independently              Exercise Goals Re-Evaluation :  Exercise Goals Re-Evaluation    Row Name 09/19/20 1126             Exercise Goal Re-Evaluation   Exercise Goals Review Increase Physical Activity;Able to understand and use rate of perceived exertion (RPE) scale;Knowledge and understanding of Target Heart Rate Range (THRR);Understanding of Exercise Prescription;Increase Strength and Stamina;Able to understand and use Dyspnea scale;Able to check pulse independently       Comments Reviewed RPE and dyspnea scales, THR and program prescription with pt today.  Pt voiced understanding and was given a copy of goals to take home.       Expected Outcomes Short: Use RPE daily to regulate intensity. Long: Follow program prescription in THR.              Discharge Exercise Prescription (Final Exercise Prescription Changes):  Exercise Prescription Changes - 09/13/20 1400      Response to Exercise   Blood Pressure (Admit) 140/74    Blood Pressure (Exercise) 158/64    Blood Pressure (Exit) 138/76    Heart Rate (Admit) 78 bpm    Heart Rate (Exercise) 104 bpm    Heart Rate (Exit) 96 bpm    Oxygen Saturation (Admit) 91 %    Oxygen Saturation (Exercise) 84 %    Oxygen Saturation (Exit) 85 %    Rating of Perceived Exertion (Exercise) 16    Symptoms SOB           Nutrition:  Target Goals: Understanding of nutrition guidelines, daily intake of sodium '1500mg'$ , cholesterol '200mg'$ , calories 30% from fat and 7% or less from saturated fats, daily to have 5 or more servings of fruits and vegetables.  Education: Controlling Sodium/Reading Food Labels -Group verbal and written material supporting the discussion of sodium use in heart healthy nutrition. Review and  explanation with models, verbal and written materials for utilization of the food label.   Cardiac Rehab from 09/02/2018 in [facility] Cardiac and Pulmonary Rehab  Date 05/13/18  Educator PI  Instruction Review Code 1- Verbalizes Understanding  Education: General Nutrition Guidelines/Fats and Fiber: -Group instruction provided by verbal, written material, models and posters to present the general guidelines for heart healthy nutrition. Gives an explanation and review of dietary fats and fiber.   Cardiac Rehab from 09/02/2018 in [facility] Cardiac and Pulmonary Rehab  Date 08/26/18  Educator LB  Instruction Review Code 1- Verbalizes Understanding      Biometrics:  Pre Biometrics - 09/13/20 1422      Pre Biometrics   Height $Remov'5\' 5"'eUNVht$  (1.651 m)    Weight 182 lb 14.4 oz (83 kg)    BMI (Calculated) 30.44    Single Leg Stand 10 seconds            Nutrition Therapy Plan and Nutrition Goals:   Nutrition Assessments:  Nutrition Assessments - 09/13/20 1425      MEDFICTS Scores   Pre Score 65           MEDIFICTS Score Key:          ?70 Need to make dietary changes          40-70 Heart Healthy Diet         ? 40 Therapeutic Level Cholesterol Diet  Nutrition Goals Re-Evaluation:   Nutrition Goals Discharge (Final Nutrition Goals Re-Evaluation):   Psychosocial: Target Goals: Acknowledge presence or absence of significant depression and/or stress, maximize coping skills, provide positive support system. Participant is able to verbalize types and ability to use techniques and skills needed for reducing stress and depression.   Education: Depression - Provides group verbal and written instruction on the correlation between heart/lung disease and depressed mood, treatment options, and the stigmas associated with seeking treatment.   Education: Sleep Hygiene -Provides group verbal and written instruction about how sleep can affect your health.  Define sleep hygiene, discuss sleep cycles  and impact of sleep habits. Review good sleep hygiene tips.    Cardiac Rehab from 09/02/2018 in [facility] Cardiac and Pulmonary Rehab  Date 08/19/18  Educator [facility]  Instruction Review Code 1- Verbalizes Understanding  [Arrived late to class]      Education: Stress and Anxiety: - Provides group verbal and written instruction about the health risks of elevated stress and causes of high stress.  Discuss the correlation between heart/lung disease and anxiety and treatment options. Review healthy ways to manage with stress and anxiety.   Cardiac Rehab from 09/02/2018 in [facility] Cardiac and Pulmonary Rehab  Date 08/05/18  Educator [facility]  Instruction Review Code 1- Verbalizes Understanding      Initial Review & Psychosocial Screening:  Initial Psych Review & Screening - 09/12/20 1116      Initial Review   Current issues with Current Anxiety/Panic      Family Dynamics   Good Support System? Yes   Lolly Mustache, 6 years, family     Barriers   Psychosocial barriers to participate in program There are no identifiable barriers or psychosocial needs.;The patient should benefit from training in stress management and relaxation.;Psychosocial barriers identified (see note)      Screening Interventions   Interventions Encouraged to exercise    Expected Outcomes Short Term goal: Utilizing psychosocial counselor, staff and physician to assist with identification of specific Stressors or current issues interfering with healing process. Setting desired goal for each stressor or current issue identified.;Long Term Goal: Stressors or current issues are controlled or eliminated.;Short Term goal: Identification and review with participant of any Quality of Life or Depression concerns found by scoring the questionnaire.;Long Term goal: The participant  improves quality of Life and PHQ9 Scores as seen by post scores and/or verbalization of changes           Quality of Life Scores:  Scores of 19 and below usually  indicate a poorer quality of life in these areas.  A difference of  2-3 points is a clinically meaningful difference.  A difference of 2-3 points in the total score of the Quality of Life Index has been associated with significant improvement in overall quality of life, self-image, physical symptoms, and general health in studies assessing change in quality of life.  PHQ-9: Recent Review Flowsheet Data    Depression screen [facility] 2/9 09/13/2020 08/26/2018 05/01/2018   Decreased Interest 0 0 0   Down, Depressed, Hopeless 0 0 0   PHQ - 2 Score 0 0 0   Altered sleeping 0 0 0   Tired, decreased energy 1 1 0   Change in appetite 0 - 0   Feeling bad or failure about yourself  0 0 0   Trouble concentrating 0 0 0   Moving slowly or fidgety/restless 0 0 0   Suicidal thoughts 0 0 0   PHQ-9 Score 1 1 0   Difficult doing work/chores Not difficult at all Not difficult at all Not difficult at all     Interpretation of Total Score  Total Score Depression Severity:  1-4 = Minimal depression, 5-9 = Mild depression, 10-14 = Moderate depression, 15-19 = Moderately severe depression, 20-27 = Severe depression   Psychosocial Evaluation and Intervention:  Psychosocial Evaluation - 09/12/20 1136      Psychosocial Evaluation & Interventions   Comments Monterrius doe not have any barriers to attending the program.  He remembers attending Cardiac Rehab several years ago and is looking forward to starting Pulmonary Rehab. He does get anxiety at time when his breathing is hard. He has learned to slow down, and reset to help get his breathing back to his normal. He lives with his Herma Carson and their 2 dogs. He has a great support system with Arbie Cookey and his family. He continues to be active, traveling and getting out his bike. He has purchased his own portable oxygen concentrator for at home and traveling. He should do great in the program.    Expected Outcomes STG: Jyren will attend all scheduled sessions to gain the most  from the program. LTG: Terreon will see improvement in his ADL activity and be able to maintain this improvement after discharge.    Continue Psychosocial Services  Follow up required by staff           Psychosocial Re-Evaluation:   Psychosocial Discharge (Final Psychosocial Re-Evaluation):   Education: Education Goals: Education classes will be provided on a weekly basis, covering required topics. Participant will state understanding/return demonstration of topics presented.  Learning Barriers/Preferences:  Learning Barriers/Preferences - 09/12/20 1121      Learning Barriers/Preferences   Learning Barriers None    Learning Preferences None           General Pulmonary Education Topics:  Infection Prevention: - Provides verbal and written material to individual with discussion of infection control including proper hand washing and proper equipment cleaning during exercise session.   Pulmonary Rehab from 09/13/2020 in [facility] Cardiac and Pulmonary Rehab  Date 09/13/20  Educator AS  Instruction Review Code 1- Verbalizes Understanding      Falls Prevention: - Provides verbal and written material to individual with discussion of falls prevention and safety.   Pulmonary  Rehab from 09/13/2020 in [facility] Cardiac and Pulmonary Rehab  Date 09/13/20  Educator AS  Instruction Review Code 1- Verbalizes Understanding      Chronic Lung Diseases: - Group verbal and written instruction to review updates, respiratory medications, advancements in procedures and treatments. Discuss use of supplemental oxygen including available portable oxygen systems, continuous and intermittent flow rates, concentrators, personal use and safety guidelines. Review proper use of inhaler and spacers. Provide informative websites for self-education.    Energy Conservation: - Provide group verbal and written instruction for methods to conserve energy, plan and organize activities. Instruct on pacing techniques,  use of adaptive equipment and posture/positioning to relieve shortness of breath.   Triggers and Exacerbations: - Group verbal and written instruction to review types of environmental triggers and ways to prevent exacerbations. Discuss weather changes, air quality and the benefits of nasal washing. Review warning signs and symptoms to help prevent infections. Discuss techniques for effective airway clearance, coughing, and vibrations.   AED/CPR: - Group verbal and written instruction with the use of models to demonstrate the basic use of the AED with the basic ABC's of resuscitation.   Cardiac Rehab from 09/02/2018 in [facility] Cardiac and Pulmonary Rehab  Date 09/02/18  Educator [place]  Instruction Review Code 1- Verbalizes Understanding      Anatomy and Physiology of the Lungs: - Group verbal and written instruction with the use of models to provide basic lung anatomy and physiology related to function, structure and complications of lung disease.   Anatomy & Physiology of the Heart: - Group verbal and written instruction and models provide basic cardiac anatomy and physiology, with the coronary electrical and arterial systems. Review of Valvular disease and Heart Failure   Cardiac Rehab from 09/02/2018 in [facility] Cardiac and Pulmonary Rehab  Date 08/07/18  Educator CE  Instruction Review Code 1- Verbalizes Understanding      Cardiac Medications: - Group verbal and written instruction to review commonly prescribed medications for heart disease. Reviews the medication, class of the drug, and side effects.   Cardiac Rehab from 09/02/2018 in [facility] Cardiac and Pulmonary Rehab  Date 08/12/18  Educator SB  Instruction Review Code 1- Verbalizes Understanding      Other: -Provides group and verbal instruction on various topics (see comments)   Knowledge Questionnaire Score:  Knowledge Questionnaire Score - 09/13/20 1424      Knowledge Questionnaire Score   Pre Score 16/18             Core Components/Risk Factors/Patient Goals at Admission:  Personal Goals and Risk Factors at Admission - 09/13/20 1426      Core Components/Risk Factors/Patient Goals on Admission    Weight Management Yes;Weight Loss    Intervention Weight Management: Develop a combined nutrition and exercise program designed to reach desired caloric intake, while maintaining appropriate intake of nutrient and fiber, sodium and fats, and appropriate energy expenditure required for the weight goal.    Admit Weight 182 lb 14.4 oz (83 kg)    Goal Weight: Short Term 180 lb (81.6 kg)    Goal Weight: Long Term 175 lb (79.4 kg)    Expected Outcomes Short Term: Continue to assess and modify interventions until short term weight is achieved;Long Term: Adherence to nutrition and physical activity/exercise program aimed toward attainment of established weight goal;Weight Loss: Understanding of general recommendations for a balanced deficit meal plan, which promotes 1-2 lb weight loss per week and includes a negative energy balance of [phone] kcal/d  Diabetes Yes    Intervention Provide education about signs/symptoms and action to take for hypo/hyperglycemia.;Provide education about proper nutrition, including hydration, and aerobic/resistive exercise prescription along with prescribed medications to achieve blood glucose in normal ranges: Fasting glucose 65-99 mg/dL    Expected Outcomes Short Term: Participant verbalizes understanding of the signs/symptoms and immediate care of hyper/hypoglycemia, proper foot care and importance of medication, aerobic/resistive exercise and nutrition plan for blood glucose control.;Long Term: Attainment of HbA1C < 7%.    Hypertension Yes    Intervention Provide education on lifestyle modifcations including regular physical activity/exercise, weight management, moderate sodium restriction and increased consumption of fresh fruit, vegetables, and low fat dairy, alcohol moderation, and  smoking cessation.;Monitor prescription use compliance.    Expected Outcomes Short Term: Continued assessment and intervention until BP is < 140/48mm HG in hypertensive participants. < 130/80mm HG in hypertensive participants with diabetes, heart failure or chronic kidney disease.;Long Term: Maintenance of blood pressure at goal levels.    Lipids Yes    Intervention Provide education and support for participant on nutrition & aerobic/resistive exercise along with prescribed medications to achieve LDL '70mg'$ , HDL >$Remo'40mg'ImRry$ .    Expected Outcomes Short Term: Participant states understanding of desired cholesterol values and is compliant with medications prescribed. Participant is following exercise prescription and nutrition guidelines.;Long Term: Cholesterol controlled with medications as prescribed, with individualized exercise RX and with personalized nutrition plan. Value goals: LDL < $Rem'70mg'txLM$ , HDL > 40 mg.    Personal Goal Other Yes           Education:Diabetes - Individual verbal and written instruction to review signs/symptoms of diabetes, desired ranges of glucose level fasting, after meals and with exercise. Acknowledge that pre and post exercise glucose checks will be done for 3 sessions at entry of program.   Cardiac Rehab from 09/02/2018 in [facility] Cardiac and Pulmonary Rehab  Date 05/01/18  Educator [facility]  Instruction Review Code 1- [employer] Understanding      Education: Know Your Numbers and Risk Factors: -Group verbal and written instruction about important numbers in your health.  Discussion of what are risk factors and how they play a role in the disease process.  Review of Cholesterol, Blood Pressure, Diabetes, and BMI and the role they play in your overall health.   Cardiac Rehab from 09/02/2018 in [facility] Cardiac and Pulmonary Rehab  Date 07/31/18  Educator CE  Instruction Review Code 1- Verbalizes Understanding      Core Components/Risk Factors/Patient Goals Review:    Core  Components/Risk Factors/Patient Goals at Discharge (Final Review):    ITP Comments:  ITP Comments    Row Name 09/12/20 1129 09/13/20 1438 09/19/20 1125 09/21/20 0618     ITP Comments Virtual orientation call completed today. he has an appointment on Date: [id number] for EP eval and gym Orientation.  Documentation of diagnosis can be found in [facility] Date: 04/14/2020. Completed 6MWT and gym orientation. Initial ITP created and sent for review to Dr. Emily Filbert, Medical Director. First full day of exercise!  Patient was oriented to gym and equipment including functions, settings, policies, and procedures.  Patient's individual exercise prescription and treatment plan were reviewed.  All starting workloads were established based on the results of the 6 minute walk test done at initial orientation visit.  The plan for exercise progression was also introduced and progression will be customized based on patient's performance and goals. 30 Day review completed. Medical Director ITP review done, changes made as directed, and signed approval by Medical Director.  Comments:

## 2020-09-21 NOTE — Progress Notes (Signed)
Daily Session Note  Patient Details  Name: Harold Parker MRN: [id number] Date of Birth: 11-03-1946 Referring Provider:     Pulmonary Rehab from 09/13/2020 in [facility] Cardiac and Pulmonary Rehab  Referring Provider Patsey Berthold      Encounter Date: 09/21/2020  Check In:  Session Check In - 09/21/20 1113      Check-In   Supervising physician immediately available to respond to emergencies See telemetry face sheet for immediately available ER MD    Location [facility]-Cardiac & Pulmonary Rehab    Staff Present Renita Papa, RN BSN;Joseph Lou Miner, [place] Exercise Physiologist;Jessica [place], [place], RCEP, CCRP, CCET;Melissa [place] RDN, LDN    Virtual Visit No    Medication changes reported     No    Fall or balance concerns reported    No    Warm-up and Cool-down Performed on first and last piece of equipment    Resistance Training Performed Yes    VAD Patient? No    PAD/SET Patient? No      Pain Assessment   Currently in Pain? No/denies              Social History   Tobacco Use  Smoking Status Former Smoker  . Packs/day: 1.00  . Years: 40.00  . Pack years: 40.00  . Types: Cigarettes  . Quit date: 02/02/2018  . Years since quitting: 2.6  Smokeless Tobacco Never Used  Tobacco Comment   Quit 01/2018 - on 05/01/2018 talked to Watson about Relaspe concerns. He ststaes that he has no taste for tobacco since he quit in Feb.    Goals Met:  Independence with exercise equipment Exercise tolerated well No report of cardiac concerns or symptoms Strength training completed today  Goals Unmet:  Not Applicable  Comments: Pt able to follow exercise prescription today without complaint.  Will continue to monitor for progression.    Dr. Emily Filbert is Medical Director for [place] and [facility].

## 2020-09-23 ENCOUNTER — Telehealth: Payer: Self-pay

## 2020-09-23 DIAGNOSIS — I739 Peripheral vascular disease, unspecified: Secondary | ICD-10-CM

## 2020-09-23 NOTE — Telephone Encounter (Signed)
Patient made aware of LE study results with verbalized understanding. Orders placed in Epic for 1 yr repeat testing.

## 2020-09-23 NOTE — Telephone Encounter (Signed)
-----   Message from Iran Ouch, MD sent at 09/23/2020 10:50 AM EDT ----- Moderately decreased ABI bilaterally with patent left SFA.  Repeat studies in 1 year.

## 2020-09-26 ENCOUNTER — Other Ambulatory Visit: Payer: Self-pay

## 2020-09-26 ENCOUNTER — Encounter: Payer: Medicare Other | Admitting: *Deleted

## 2020-09-26 DIAGNOSIS — J449 Chronic obstructive pulmonary disease, unspecified: Secondary | ICD-10-CM

## 2020-09-26 LAB — GLUCOSE, CAPILLARY
Glucose-Capillary: 163 mg/dL — ABNORMAL HIGH (ref 70–99)
Glucose-Capillary: 98 mg/dL (ref 70–99)

## 2020-09-26 NOTE — Progress Notes (Signed)
Daily Session Note  Patient Details  Name: Harold Parker MRN: [id number] Date of Birth: 10-Jul-1946 Referring Provider:     Pulmonary Rehab from 09/13/2020 in [facility] Cardiac and Pulmonary Rehab  Referring Provider Patsey Berthold      Encounter Date: 09/26/2020  Check In:  Session Check In - 09/26/20 1058      Check-In   Supervising physician immediately available to respond to emergencies See telemetry face sheet for immediately available ER MD    Location [facility]-Cardiac & Pulmonary Rehab    Staff Present Renita Papa, RN BSN;Joseph [address] [place], [place], ACSM CEP, Exercise Physiologist;Kara Eliezer Bottom, MS Exercise Physiologist    Virtual Visit No    Medication changes reported     No    Fall or balance concerns reported    No    Warm-up and Cool-down Performed on first and last piece of equipment    Resistance Training Performed Yes    VAD Patient? No    PAD/SET Patient? No      Pain Assessment   Currently in Pain? No/denies              Social History   Tobacco Use  Smoking Status Former Smoker  . Packs/day: 1.00  . Years: 40.00  . Pack years: 40.00  . Types: Cigarettes  . Quit date: 02/02/2018  . Years since quitting: 2.6  Smokeless Tobacco Never Used  Tobacco Comment   Quit 01/2018 - on 05/01/2018 talked to Valentin about Relaspe concerns. He ststaes that he has no taste for tobacco since he quit in Feb.    Goals Met:  Independence with exercise equipment Exercise tolerated well No report of cardiac concerns or symptoms Strength training completed today  Goals Unmet:  Not Applicable  Comments: Pt able to follow exercise prescription today without complaint.  Will continue to monitor for progression.    Dr. Emily Filbert is Medical Director for [place] and [facility].

## 2020-09-30 ENCOUNTER — Other Ambulatory Visit: Payer: Self-pay

## 2020-09-30 ENCOUNTER — Encounter: Payer: Medicare Other | Admitting: *Deleted

## 2020-09-30 DIAGNOSIS — J449 Chronic obstructive pulmonary disease, unspecified: Secondary | ICD-10-CM | POA: Diagnosis not present

## 2020-09-30 NOTE — Progress Notes (Signed)
Daily Session Note  Patient Details  Name: Harold Parker MRN: [id number] Date of Birth: [birth year] Referring Provider:     Pulmonary Rehab from 09/13/2020 in [facility] Cardiac and Pulmonary Rehab  Referring Provider Harold Parker      Encounter Date: 09/30/2020  Check In:  Session Check In - 09/30/20 1144      Check-In   Supervising physician immediately available to respond to emergencies See telemetry face sheet for immediately available ER MD    Location [facility]-Cardiac & Pulmonary Rehab    Staff Present Renita Papa, RN BSN;Joseph [address] [place], [place], [place], CCRP, CCET    Virtual Visit No    Medication changes reported     No    Fall or balance concerns reported    No    Warm-up and Cool-down Performed on first and last piece of equipment    Resistance Training Performed Yes    VAD Patient? No    PAD/SET Patient? No      Pain Assessment   Currently in Pain? No/denies              Social History   Tobacco Use  Smoking Status Former Smoker  . Packs/day: 1.00  . Years: 40.00  . Pack years: 40.00  . Types: Cigarettes  . Quit date: 02/02/2018  . Years since quitting: 2.6  Smokeless Tobacco Never Used  Tobacco Comment   Quit 01/2018 - on 05/01/2018 talked to Harold Parker about Relaspe concerns. He ststaes that he has no taste for tobacco since he quit in Feb.    Goals Met:  Independence with exercise equipment Exercise tolerated well No report of cardiac concerns or symptoms Strength training completed today  Goals Unmet:  Not Applicable  Comments: Pt able to follow exercise prescription today without complaint.  Will continue to monitor for progression.    Dr. Emily Parker is Medical Director for [place] and [facility].

## 2020-10-03 ENCOUNTER — Encounter: Payer: Medicare Other | Admitting: *Deleted

## 2020-10-03 ENCOUNTER — Other Ambulatory Visit: Payer: Self-pay

## 2020-10-03 DIAGNOSIS — J449 Chronic obstructive pulmonary disease, unspecified: Secondary | ICD-10-CM

## 2020-10-03 NOTE — Progress Notes (Signed)
Daily Session Note  Patient Details  Name: Harold Parker MRN: [id number] Date of Birth: June 30, 1946 Referring Provider:     Pulmonary Rehab from 09/13/2020 in [facility] Cardiac and Pulmonary Rehab  Referring Provider Harold Parker      Encounter Date: 10/03/2020  Check In:  Session Check In - 10/03/20 1114      Check-In   Supervising physician immediately available to respond to emergencies See telemetry face sheet for immediately available ER MD    Location [facility]-Cardiac & Pulmonary Rehab    Staff Present Renita Papa, RN BSN;Joseph Lou Miner, [place] Exercise Physiologist;Kelly Amedeo [place], [place], ACSM CEP, Exercise Physiologist    Virtual Visit No    Medication changes reported     No    Fall or balance concerns reported    No    Warm-up and Cool-down Performed on first and last piece of equipment    Resistance Training Performed Yes    VAD Patient? No    PAD/SET Patient? No      Pain Assessment   Currently in Pain? No/denies              Social History   Tobacco Use  Smoking Status Former Smoker  . Packs/day: 1.00  . Years: 40.00  . Pack years: 40.00  . Types: Cigarettes  . Quit date: 02/02/2018  . Years since quitting: 2.6  Smokeless Tobacco Never Used  Tobacco Comment   Quit 01/2018 - on 05/01/2018 talked to Harold Parker about Relaspe concerns. He ststaes that he has no taste for tobacco since he quit in Feb.    Goals Met:  Independence with exercise equipment Exercise tolerated well No report of cardiac concerns or symptoms Strength training completed today  Goals Unmet:  Not Applicable  Comments: Pt able to follow exercise prescription today without complaint.  Will continue to monitor for progression.    Dr. Emily Filbert is Medical Director for [place] and [facility].

## 2020-10-04 ENCOUNTER — Ambulatory Visit (INDEPENDENT_AMBULATORY_CARE_PROVIDER_SITE_OTHER): Payer: Medicare Other | Admitting: Cardiovascular Disease

## 2020-10-04 ENCOUNTER — Other Ambulatory Visit: Payer: Self-pay

## 2020-10-04 ENCOUNTER — Encounter: Payer: Self-pay | Admitting: Cardiovascular Disease

## 2020-10-04 VITALS — BP 138/60 | HR 75 | Ht 66.0 in | Wt 182.0 lb

## 2020-10-04 DIAGNOSIS — I2510 Atherosclerotic heart disease of native coronary artery without angina pectoris: Secondary | ICD-10-CM | POA: Diagnosis not present

## 2020-10-04 DIAGNOSIS — E785 Hyperlipidemia, unspecified: Secondary | ICD-10-CM

## 2020-10-04 DIAGNOSIS — I739 Peripheral vascular disease, unspecified: Secondary | ICD-10-CM

## 2020-10-04 DIAGNOSIS — I5022 Chronic systolic (congestive) heart failure: Secondary | ICD-10-CM

## 2020-10-04 DIAGNOSIS — I359 Nonrheumatic aortic valve disorder, unspecified: Secondary | ICD-10-CM | POA: Diagnosis not present

## 2020-10-04 NOTE — Patient Instructions (Signed)
Medication Instructions:  Your physician recommends that you continue on your current medications as directed. Please refer to the Current Medication list given to you today.  *If you need a refill on your cardiac medications before your next appointment, please call your pharmacy*  Follow-Up: At [facility], you and your health needs are our priority.  As part of our continuing mission to provide you with exceptional heart care, we have created designated Provider Care Teams.  These Care Teams include your primary Cardiologist (physician) and Advanced Practice Providers (APPs -  Physician Assistants and Nurse Practitioners) who all work together to provide you with the care you need, when you need it.  We recommend signing up for the patient portal called "MyChart".  Sign up information is provided on this After Visit Summary.  MyChart is used to connect with patients for Virtual Visits (Telemedicine).  Patients are able to view lab/test results, encounter notes, upcoming appointments, etc.  Non-urgent messages can be sent to your provider as well.   To learn more about what you can do with MyChart, go to [email or web address]://[email or web address].com.    Your next appointment:   6 month(s)  The format for your next appointment:   In Person  Provider:   You may see Muhammad Arida, MD or one of the following Advanced Practice Providers on your designated Care Team:    Christopher Berge, NP  Ryan Dunn, PA-C  Jacquelyn Visser, PA-C  Cadence Furth, PA-C   

## 2020-10-04 NOTE — Progress Notes (Signed)
Cardiology Office Note   Date:  10/04/2020   ID:  Harold Parker, DOB February 09, 1946, MRN [id number]  PCP:  Merlene Laughter, MD  Cardiologist:   [name], MD  Chief Complaint  Patient presents with  . OTHER    6 month f/u c/o occassional chest discomfort, sob and trouble breathing with walking. Meds reviewed verbally with pt.      History of Present Illness: Harold Parker is a 74 y.o. male who presents for a follow-up visit regarding peripheral arterial disease, coronary artery disease and aortic stenosis status post CABG and bioprosthetic aortic valve in March 2019.  Other medical problems include essential hypertension, hyperlipidemia, type 2 diabetes, COPD and previous tobacco use. 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  He presented in February of 2019 with non-ST elevation myocardial infarction. Cardiac catheterization revealed severe left main stenosis in addition to significant disease involving OM1 and RCA. EF was 40 to 45% with moderate aortic stenosis. The patient underwent CABG and aortic valve replacement.He had postoperative atrial fibrillation that was controlled with amiodarone. He was discharged home on aspirin and Plavix. He presented back with an upper GI bleed with a hemoglobin 4.7. EGD showed duodenal ulcers.  The patient is known to have bilateral leg claudication.  Angiography in March 2020 showed no significant aortoiliac disease.  On the right, there was long heavily calcified occlusion of the SFA with very well-developed collaterals from the profunda and three-vessel runoff below the knee.  On the left, there was heavily calcified disease affecting the left SFA with three-vessel runoff below the knee.  I performed successful orbital atherectomy and drug-coated balloon angioplasty to the left SFA.  He has been doing reasonably well with mild bilateral calf claudication.  He has exertional dyspnea with  underlying COPD.  He has occasional twinges in his chest lasting for few seconds but no tightness feeling.   Past Medical History:  Diagnosis Date  . Bilateral carotid bruits    a. 01/2018 U/S: < 50% bilat ICA stenosis.  Marland Kitchen CAD (coronary artery disease)    a. 1998 s/p MI and BMS [facility], [place]); b. 1999 redo PCI/rotablator in setting of what sounds like ISR;  c. Multiple stress tests over the years - last ~ 2017, reportedly nl; d. 01/2018 NSTEMI/Cath: LM 46m/d, LAD 50p, 40p/m, D1 60ost, OM1 95, RCA 100ost/p w/ L->R collats, EF 45%; e. s/p 3V CABG 02/19/18 (LIMA-LAD, VG-D1, VG-OM)  . Chronic lower back pain   . COPD (chronic obstructive pulmonary disease) (HCC)   . GIB (gastrointestinal bleeding)    a. 02/2018 GIB and anemia w/ Hgb of 4.7 on presentation; b. 03/2018 EGD: 2 nonbleeding duodenal ulcers.  Marland Kitchen HTN (hypertension)   . Hypercholesteremia   . Ischemic cardiomyopathy    a. 01/2018 Echo: EF 40-45%, mid-apicalanteroseptal, ant, apical sev HK, mod apicalinferior HK. Gr2 DD. Mod AS, mild MR, mod dil LA, PASP .  . Moderate aortic stenosis    a. 01/2018 Echo: Mod AS, mean grad (S) , Valve area (VTI) 1.06 cm^2, (Vmax) 1.27 cm^2; b. 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  . Myocardial infarction (HCC) ~ 1998/1999  . 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 02/19/2018 AVR: 25 mm Edwards Inspiris Resilia stented bovine pericardial tissue valve  . 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 Tobacco abuse   .  Type II diabetes mellitus (HCC)     Past Surgical History:  Procedure Laterality Date  . ABDOMINAL AORTOGRAM N/A 03/04/2019   Procedure: ABDOMINAL AORTOGRAM;  Surgeon: Iran Ouch, MD;  [facility]: [facility];  Service: Cardiovascular;  Laterality: N/A;  . AORTIC VALVE REPLACEMENT N/A 02/19/2018   Procedure: AORTIC VALVE REPLACEMENT (AVR);  Surgeon: Purcell Nails, MD;  Location: [facility] OR;  Service: Open  Heart Surgery;  Laterality: N/A;  . COLONOSCOPY    . CORONARY ANGIOPLASTY WITH STENT PLACEMENT  ~ 1998/1999  . CORONARY ARTERY BYPASS GRAFT N/A 02/19/2018   Procedure: CORONARY ARTERY BYPASS GRAFTING (CABG) x three , using left internal mammary artery and right leg greater saphenous vein harvested endoscopically;  Surgeon: Purcell Nails, MD;  Location: [facility] OR;  Service: Open Heart Surgery;  Laterality: N/A;  . ESOPHAGOGASTRODUODENOSCOPY (EGD) WITH PROPOFOL N/A 03/18/2018   Procedure: ESOPHAGOGASTRODUODENOSCOPY (EGD) WITH PROPOFOL;  Surgeon: Midge Minium, MD;  Location: [facility] ENDOSCOPY;  Service: Endoscopy;  Laterality: N/A;  . LEFT HEART CATH AND CORONARY ANGIOGRAPHY N/A 02/06/2018   Procedure: LEFT HEART CATH AND CORONARY ANGIOGRAPHY;  Surgeon: Iran Ouch, MD;  Location: [facility];  Service: Cardiovascular;  Laterality: N/A;  . LOWER EXTREMITY ANGIOGRAPHY Bilateral 03/04/2019   Procedure: Lower Extremity Angiography;  Surgeon: Iran Ouch, MD;  Location: [facility];  Service: Cardiovascular;  Laterality: Bilateral;  . PERIPHERAL VASCULAR ATHERECTOMY Left 03/04/2019   Procedure: PERIPHERAL VASCULAR ATHERECTOMY;  Surgeon: Iran Ouch, MD;  Location: [facility];  Service: Cardiovascular;  Laterality: Left;  SFA  . TEE WITHOUT CARDIOVERSION N/A 02/19/2018   Procedure: TRANSESOPHAGEAL ECHOCARDIOGRAM (TEE);  Surgeon: Purcell Nails, MD;  Location: [facility] OR;  Service: Open Heart Surgery;  Laterality: N/A;  . TONSILLECTOMY       Current Outpatient Medications  Medication Sig Dispense Refill  . acetaminophen (TYLENOL) 500 MG tablet Take 1,000 mg by mouth daily as needed for moderate pain or headache.    . ALPRAZolam (XANAX) 0.5 MG tablet Take 0.5 mg by mouth 2 (two) times daily.     Marland Kitchen amLODipine (NORVASC) 5 MG tablet Take 5 mg by mouth daily.    . Ascorbic Acid (VITAMIN C) 1000 MG tablet Take 1,000 mg by mouth daily.    Marland Kitchen aspirin EC 81 MG tablet Take 81 mg by  mouth daily.    Marland Kitchen atorvastatin (LIPITOR) 80 MG tablet Take 80 mg by mouth daily.    . Budeson-Glycopyrrol-Formoterol (BREZTRI AEROSPHERE) 160-9-4.8 MCG/ACT AERO Inhale 2 puffs into the lungs 2 (two) times daily. 10.7 g 11  . esomeprazole (NEXIUM) 40 MG capsule Take 40 mg by mouth at bedtime.     Marland Kitchen ezetimibe (ZETIA) 10 MG tablet Take 10 mg by mouth at bedtime.     . folic acid (FOLVITE) 400 MCG tablet Take 400 mcg by mouth every evening.    . furosemide (LASIX) 40 MG tablet TAKE 1 TABLET BY MOUTH EVERY DAY 90 tablet 2  . glipiZIDE (GLUCOTROL) 5 MG tablet Take 0.5 tablets (2.5 mg total) by mouth daily before breakfast. (Patient taking differently: Take 5 mg by mouth See admin instructions. Take 5 mg in the morning and take a second 5 mg dose at night on Mon, Wed, and Fri) 30 tablet 1  . ipratropium-albuterol (DUONEB) 0.5-2.5 (3) MG/3ML SOLN Take 3 mLs by nebulization every 6 (six) hours as needed. (Patient taking differently: Take 3 mLs by nebulization every 6 (six) hours as needed (  shortness of breath). ) 360 mL 1  . losartan (COZAAR) 100 MG tablet TAKE 1 TABLET BY MOUTH EVERY DAY Please call to schedule appointment for further refills. Thank you! (Patient taking differently: 50 mg. TAKE 1 TABLET BY MOUTH EVERY DAY Please call to schedule appointment for further refills. Thank you!) 90 tablet 0  . Melatonin 10 MG CAPS Take 10 mg by mouth at bedtime.    . metFORMIN (GLUCOPHAGE) 1000 MG tablet TAKE 1 TABLET BY MOUTH TWICE DAILY WITH A MEAL (Patient taking differently: Take 1,000 mg by mouth 2 (two) times daily. ) 60 tablet 0  . metoprolol succinate (TOPROL-XL) 100 MG 24 hr tablet TAKE 1 TABLET BY MOUTH EVERY DAY WITH OR IMMEDIATELY FOLLOWING A MEAL 90 tablet 1  . PROAIR HFA 108 (90 Base) MCG/ACT inhaler INL 2 PFS PO Q 6 H PRN    . Vitamin D, Cholecalciferol, 1000 units TABS Take 1,000 Units by mouth every morning.      No current facility-administered medications for this visit.    Allergies:    Patient has no known allergies.    Social History:  The patient  reports that he quit smoking about 2 years ago. His smoking use included cigarettes. He has a 40.00 pack-year smoking history. He has never used smokeless tobacco. He reports current alcohol use. He reports that he does not use drugs.   Family History:  The patient's family history includes Lymphoma in his mother; Peripheral vascular disease in his father.    ROS:  Please see the history of present illness.   Otherwise, review of systems are positive for none.   All other systems are reviewed and negative.    PHYSICAL EXAM: VS:  BP 138/60 (BP Location: Left Arm, Patient Position: Sitting, Cuff Size: Normal)   Pulse 75   Ht 5\' 6"  (1.676 m)   Wt 182 lb (82.6 kg)   SpO2 90%   BMI 29.38 kg/m  , BMI Body mass index is 29.38 kg/m. GEN: Well nourished, well developed, in no acute distress  HEENT: normal  Neck: no JVD, carotid bruits, or masses Cardiac: RRR; no  rubs, or gallops,no edema, no murmur. Respiratory:  clear to auscultation bilaterally, normal work of breathing GI: soft, nontender, nondistended, + BS MS: no deformity or atrophy  Skin: warm and dry, no rash Neuro:  Strength and sensation are intact Psych: euthymic mood, full affect    EKG:  EKG is ordered today. The ekg ordered today demonstrates normal sinus rhythm with right bundle branch block, with prior inferior infarct.  Recent Labs: No results found for requested labs within last 8760 hours.    Lipid Panel    Component Value Date/Time   CHOL 102 03/27/2018 0900   TRIG 84 03/27/2018 0900   HDL 35 (L) 03/27/2018 0900   CHOLHDL 2.9 03/27/2018 0900   CHOLHDL 3.3 02/06/2018 0244   VLDL 23 02/06/2018 0244   LDLCALC 50 03/27/2018 0900      Wt Readings from Last 3 Encounters:  10/04/20 182 lb (82.6 kg)  09/13/20 182 lb 14.4 oz (83 kg)  07/05/20 183 lb 3.2 oz (83.1 kg)       No flowsheet data found.    ASSESSMENT AND PLAN:  1.  Peripheral arterial disease:  Status post atherectomy and drug-coated balloon angioplasty of the left SFA.  He has mild bilateral calf claudication.  Most recent vascular studies earlier this month showed an ABI in the 0.7 range with patent left SFA.  2. Coronary artery disease involving native coronary arterieswithout angina:Status post CABG in March 2019. He is doing better overall.  3. Status post bioprosthetic aortic valve replacement for aortic stenosis: Most recent echo showed normal functioning bioprosthetic aortic valve.  4. Chronic systolic heart failure with mildly reduced LV systolic function.  Most recent echocardiogram earlier this month showed improvement in ejection fraction to 45 to 50% with normal functioning bioprosthetic aortic valve.  Continue treatment with losartan and Toprol.   He seems to be euvolemic on current dose of furosemide.  Given stability and symptoms, I made no changes in medications but we could certainly consider switching to [facility] in the future.  5. Hyperlipidemia: He is tolerating high-dose atorvastatin and Zetia.  He had labs done with his primary care physician but results are not available.  Recommend a target LDL of less than 70.  6.  Essential hypertension: Blood pressure is reasonably controlled on current medications.  7.  COPD: Significant exertional dyspnea.  He is followed by pulmonary and uses oxygen intermittently.   Disposition:   FU with me in 6 month   Signed, [name], MD 10/04/20 [facility] [place], [place] [phone]

## 2020-10-07 ENCOUNTER — Other Ambulatory Visit: Payer: Self-pay

## 2020-10-07 ENCOUNTER — Encounter: Payer: Medicare Other | Admitting: *Deleted

## 2020-10-07 DIAGNOSIS — J449 Chronic obstructive pulmonary disease, unspecified: Secondary | ICD-10-CM | POA: Diagnosis not present

## 2020-10-07 NOTE — Progress Notes (Signed)
Daily Session Note  Patient Details  Name: Harold Parker MRN: [id number] Date of Birth: 1946-05-31 Referring Provider:     Pulmonary Rehab from 09/13/2020 in [facility] Cardiac [facility] Pulmonary Rehab  Referring Provider Patsey Berthold      Encounter Date: 10/07/2020  Check In:  Session Check In - 10/07/20 1121      Check-In   Supervising physician immediately available to respond to emergencies See telemetry face sheet for immediately available ER MD    Location [facility]-Cardiac & Pulmonary Rehab    Staff Present Renita Papa, RN BSN;Joseph [address] [place], [place], [place], CCRP, CCET    Virtual Visit No    Medication changes reported     No    Fall or balance concerns reported    No    Warm-up and Cool-down Performed on first and last piece of equipment    Resistance Training Performed Yes    VAD Patient? No    PAD/SET Patient? No      Pain Assessment   Currently in Pain? No/denies              Social History   Tobacco Use  Smoking Status Former Smoker  . Packs/day: 1.00  . Years: 40.00  . Pack years: 40.00  . Types: Cigarettes  . Quit date: 02/02/2018  . Years since quitting: 2.6  Smokeless Tobacco Never Used  Tobacco Comment   Quit 01/2018 - on 05/01/2018 talked to [place] about Relaspe concerns. He ststaes that he has no taste for tobacco since he quit in Feb.    Goals Met:  Independence with exercise equipment Exercise tolerated well No report of cardiac concerns or symptoms Strength training completed today  Goals Unmet:  Not Applicable  Comments: Pt able to follow exercise prescription today without complaint.  Will continue to monitor for progression.    Dr. Emily Filbert is Medical Director for [facility] and [facility].

## 2020-10-10 ENCOUNTER — Other Ambulatory Visit: Payer: Self-pay

## 2020-10-10 ENCOUNTER — Encounter: Payer: Medicare Other | Admitting: *Deleted

## 2020-10-10 DIAGNOSIS — J449 Chronic obstructive pulmonary disease, unspecified: Secondary | ICD-10-CM | POA: Diagnosis not present

## 2020-10-10 NOTE — Progress Notes (Signed)
Daily Session Note  Patient Details  Name: Harold Parker MRN: [id number] Date of Birth: 07/26/1946 Referring Provider:     Pulmonary Rehab from 09/13/2020 in [facility] Cardiac and Pulmonary Rehab  Referring Provider Gonzalez      Encounter Date: 10/10/2020  Check In:  Session Check In - 10/10/20 1119      Check-In   Supervising physician immediately available to respond to emergencies See telemetry face sheet for immediately available ER MD    Location [facility] Pul[facility] Re[facility]    Staff Present Meredith Craven, RN BSN;Joseph Hood RCP,RRT,BSRT;Kara Langdon, MS Exercise Physiologist;Kelly Hayes, BS, ACSM CEP, Exercise Physiologist    Virtual Visit No    Medication changes reported     No    Fall or balance concerns reported    No    Warm-up and Cool-down Performed on first and last piece of equipment    Resistance Training Performed Yes    VAD Patient? No    PAD/SET Patient? No      Pain Assessment   Currently in Pain? No/denies              Social History   Tobacco Use  Smoking Status Former Smoker  . Packs/day: 1.00  . Years: 40.00  . Pack years: 40.00  . Types: Cigarettes  . Quit date: 02/02/2018  . Years since quitting: 2.6  Smokeless Tobacco Never Used  Tobacco Comment   Quit 01/2018 - on 05/01/2018 talked to [name] about Relaspe concerns. He ststaes that he has no taste for tobacco since he quit in Feb.    Goals Met:  Independence with exercise equipment Exercise tolerated well No report of cardiac concerns or symptoms Strength training completed today  Goals Unmet:  Not Applicable  Comments: Pt able to follow exercise prescription today without complaint.  Will continue to monitor for progression.    Dr. Mark Miller is Medical Director for [facility] and [facility]. 

## 2020-10-17 ENCOUNTER — Other Ambulatory Visit: Payer: Self-pay

## 2020-10-17 ENCOUNTER — Encounter: Payer: Medicare Other | Attending: Pulmonary Disease | Admitting: *Deleted

## 2020-10-17 DIAGNOSIS — J449 Chronic obstructive pulmonary disease, unspecified: Secondary | ICD-10-CM | POA: Insufficient documentation

## 2020-10-17 LAB — GLUCOSE, CAPILLARY
Glucose-Capillary: 121 mg/dL — ABNORMAL HIGH (ref 70–99)
Glucose-Capillary: 59 mg/dL — ABNORMAL LOW (ref 70–99)

## 2020-10-17 NOTE — Progress Notes (Signed)
Daily Session Note  Patient Details  Name: Harold Parker MRN: [id number] Date of Birth: 10-13-1946 Referring Provider:     Pulmonary Rehab from 09/13/2020 in [facility] Cardiac and Pulmonary Rehab  Referring Provider Patsey Berthold      Encounter Date: 10/17/2020  Check In:  Session Check In - 10/17/20 1116      Check-In   Supervising physician immediately available to respond to emergencies See telemetry face sheet for immediately available ER MD    Location [facility]-Cardiac & Pulmonary Rehab    Staff Present Renita Papa, RN Moises Blood, BS, ACSM CEP, Exercise Physiologist;Joseph Lou Miner, [place] Exercise Physiologist    Virtual Visit No    Medication changes reported     No    Fall or balance concerns reported    No    Warm-up and Cool-down Performed on first and last piece of equipment    Resistance Training Performed Yes    VAD Patient? No    PAD/SET Patient? No      Pain Assessment   Currently in Pain? No/denies              Social History   Tobacco Use  Smoking Status Former Smoker  . Packs/day: 1.00  . Years: 40.00  . Pack years: 40.00  . Types: Cigarettes  . Quit date: 02/02/2018  . Years since quitting: 2.7  Smokeless Tobacco Never Used  Tobacco Comment   Quit 01/2018 - on 05/01/2018 talked to [place] about Relaspe concerns. He ststaes that he has no taste for tobacco since he quit in Feb.    Goals Met:  Independence with exercise equipment Exercise tolerated well No report of cardiac concerns or symptoms Strength training completed today  Goals Unmet:  Not Applicable  Comments: Pt able to follow exercise prescription today without complaint.  Will continue to monitor for progression.    Dr. Emily Filbert is Medical Director for [facility] and [facility].

## 2020-10-19 ENCOUNTER — Other Ambulatory Visit: Payer: Self-pay

## 2020-10-19 ENCOUNTER — Encounter: Payer: Self-pay | Admitting: *Deleted

## 2020-10-19 DIAGNOSIS — J449 Chronic obstructive pulmonary disease, unspecified: Secondary | ICD-10-CM | POA: Diagnosis not present

## 2020-10-19 NOTE — Progress Notes (Signed)
Pulmonary Individual Treatment Plan  Patient Details  Name: Harold Parker MRN: [id number] Date of Birth: 1946-08-07 Referring Provider:     Pulmonary Rehab from 09/13/2020 in [facility] Cardiac and Pulmonary Rehab  Referring Provider Patsey Berthold      Initial Encounter Date:    Pulmonary Rehab from 09/13/2020 in [facility] Cardiac and Pulmonary Rehab  Date 09/13/20      Visit Diagnosis: Chronic obstructive pulmonary disease, unspecified COPD type ([place])  Patient's Home Medications on Admission:  Current Outpatient Medications:  .  acetaminophen (TYLENOL) 500 MG tablet, Take 1,000 mg by mouth daily as needed for moderate pain or headache., Disp: , Rfl:  .  ALPRAZolam (XANAX) 0.5 MG tablet, Take 0.5 mg by mouth 2 (two) times daily. , Disp: , Rfl:  .  amLODipine (NORVASC) 5 MG tablet, Take 5 mg by mouth daily., Disp: , Rfl:  .  Ascorbic Acid (VITAMIN C) 1000 MG tablet, Take 1,000 mg by mouth daily., Disp: , Rfl:  .  aspirin EC 81 MG tablet, Take 81 mg by mouth daily., Disp: , Rfl:  .  atorvastatin (LIPITOR) 80 MG tablet, Take 80 mg by mouth daily., Disp: , Rfl:  .  Budeson-Glycopyrrol-Formoterol (BREZTRI AEROSPHERE) 160-9-4.8 MCG/ACT AERO, Inhale 2 puffs into the lungs 2 (two) times daily., Disp: 10.7 g, Rfl: 11 .  esomeprazole (NEXIUM) 40 MG capsule, Take 40 mg by mouth at bedtime. , Disp: , Rfl:  .  ezetimibe (ZETIA) 10 MG tablet, Take 10 mg by mouth at bedtime. , Disp: , Rfl:  .  folic acid (FOLVITE) 572 MCG tablet, Take 400 mcg by mouth every evening., Disp: , Rfl:  .  furosemide (LASIX) 40 MG tablet, TAKE 1 TABLET BY MOUTH EVERY DAY, Disp: 90 tablet, Rfl: 2 .  glipiZIDE (GLUCOTROL) 5 MG tablet, Take 0.5 tablets (2.5 mg total) by mouth daily before breakfast. (Patient taking differently: Take 5 mg by mouth See admin instructions. Take 5 mg in the morning and take a second 5 mg dose at night on Mon, Wed, and Fri), Disp: 30 tablet, Rfl: 1 .  ipratropium-albuterol (DUONEB) 0.5-2.5 (3) MG/3ML SOLN, Take 3  mLs by nebulization every 6 (six) hours as needed. (Patient taking differently: Take 3 mLs by nebulization every 6 (six) hours as needed (shortness of breath). ), Disp: 360 mL, Rfl: 1 .  losartan (COZAAR) 100 MG tablet, TAKE 1 TABLET BY MOUTH EVERY DAY Please call to schedule appointment for further refills. Thank you! (Patient taking differently: 50 mg. TAKE 1 TABLET BY MOUTH EVERY DAY Please call to schedule appointment for further refills. Thank you!), Disp: 90 tablet, Rfl: 0 .  Melatonin 10 MG CAPS, Take 10 mg by mouth at bedtime., Disp: , Rfl:  .  metFORMIN (GLUCOPHAGE) 1000 MG tablet, TAKE 1 TABLET BY MOUTH TWICE DAILY WITH A MEAL (Patient taking differently: Take 1,000 mg by mouth 2 (two) times daily. ), Disp: 60 tablet, Rfl: 0 .  metoprolol succinate (TOPROL-XL) 100 MG 24 hr tablet, TAKE 1 TABLET BY MOUTH EVERY DAY WITH OR IMMEDIATELY FOLLOWING A MEAL, Disp: 90 tablet, Rfl: 1 .  PROAIR HFA 108 (90 Base) MCG/ACT inhaler, INL 2 PFS PO Q 6 H PRN, Disp: , Rfl:  .  Vitamin D, Cholecalciferol, 1000 units TABS, Take 1,000 Units by mouth every morning. , Disp: , Rfl:   Past Medical History: Past Medical History:  Diagnosis Date  . Bilateral carotid bruits    a. 01/2018 U/S: < 50% bilat ICA stenosis.  Marland Kitchen CAD (coronary  artery disease)    a. 1998 s/p MI and BMS [facility], [place]); b. 1999 redo PCI/rotablator in setting of what sounds like ISR;  c. Multiple stress tests over the years - last ~ 2017, reportedly nl; d. 01/2018 NSTEMI/Cath: LM 29md, LAD 50p, 40p/m, D1 60ost, OM1 95, RCA 100ost/p w/ L->R collats, EF 45%; e. s/p 3V CABG 02/19/18 (LIMA-LAD, VG-D1, VG-OM)  . Chronic lower back pain   . COPD [place]chronic obstructive pulmonary disease) [place]   . GIB (gastrointestinal bleeding)    a. 02/2018 GIB and anemia w/ Hgb of 4.7 on presentation; b. 03/2018 EGD: 2 nonbleeding duodenal ulcers.  .Marland KitchenHTN (hypertension)   . Hypercholesteremia   . Ischemic cardiomyopathy    a. 01/2018 Echo: EF 40-45%,  mid-apicalanteroseptal, ant, apical sev HK, mod apicalinferior HK. Gr2 DD. Mod AS, mild MR, mod dil LA, PASP 574mg.  . Moderate aortic stenosis    a. 01/2018 Echo: Mod AS, mean grad (S) 2058m, Valve area (VTI) 1.06 cm^2, (Vmax) 1.27 cm^2; b. 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  . Myocardial infarction ([facility] 1998[place]  . 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 02/19/2018 AVR: 25 mm Edwards Inspiris Resilia stented bovine pericardial tissue valve  . 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 Tobacco abuse   . Type II diabetes mellitus ([facility])     Tobacco Use: Social History   Tobacco Use  Smoking Status Former Smoker  . Packs/day: 1.00  . Years: 40.00  . Pack years: 40.00  . Types: Cigarettes  . Quit date: 02/02/2018  . Years since quitting: 2.7  Smokeless Tobacco Never Used  Tobacco Comment   Quit 01/2018 - on 05/01/2018 talked to RalAmontaeout Relaspe concerns. He ststaes that he has no taste for tobacco since he quit in Feb.    Labs: Recent Review Flowsheet Data    Labs for ITP Cardiac and Pulmonary Rehab Latest Ref Rng & Units 02/19/2018 02/19/2018 02/20/2018 02/20/2018 03/27/2018   Cholestrol 100 - 199 mg/dL - - - - 102   LDLCALC 0 - 99 mg/dL - - - - 50   HDL >39 mg/dL - - - - 35(L)   Trlycerides 0 - 149 mg/dL - - - - 84   Hemoglobin A1c 4.8 - 5.6 % - - - - -   PHART 7.35 - 7.45 - 7.335(L) 7.361 7.331(L) -   PCO2ART 32 - 48 mmHg - 40.2 40.1 41.0 -   HCO3 20.0 - 28.0 mmol/L - 21.2 22.5 21.5 -   TCO2 22 - 32 mmol/L _0 -   ACIDBASEDEF 0.0 - 2.0 mmol/L - 4.0(H) 2.0 4.0(H) -   O2SAT % - 97.0 97.0 95.0 -       Pulmonary Assessment Scores:  Pulmonary Assessment Scores    Row Name 09/13/20 1422         ADL UCSD   SOB Score total 21     Rest 0     Walk 2     Stairs 3     Bath 2     Dress 0     Shop 0       CAT Score   CAT Score 16       mMRC Score   mMRC Score 3             UCSD: Self-administered rating of dyspnea associated with activities of  daily living (ADLs) 6-point scale (0 = "not at all" to 5 = "maximal or unable to do because of breathlessness")  Scoring Scores range from 0 to 120.  Minimally important difference is 5 units  CAT: CAT can identify the health impairment of COPD patients and is better correlated with disease progression.  CAT has a scoring range of zero to 40. The CAT score is classified into four groups of low (less than 10), medium (10 - 20), high (21-30) and very high (31-40) based on the impact level of disease on health status. A CAT score over 10 suggests significant symptoms.  A worsening CAT score could be explained by an exacerbation, poor medication adherence, poor inhaler technique, or progression of COPD or comorbid conditions.  CAT MCID is 2 points  mMRC: mMRC (Modified Medical Research Council) Dyspnea Scale is used to assess the degree of baseline functional disability in patients of respiratory disease due to dyspnea. No minimal important difference is established. A decrease in score of 1 point or greater is considered a positive change.   Pulmonary Function Assessment:   Exercise Target Goals: Exercise Program Goal: Individual exercise prescription set using results from initial 6 min walk test and THRR while considering  patient's activity barriers and safety.   Exercise Prescription Goal: Initial exercise prescription builds to 30-45 minutes a day of aerobic activity, 2-3 days per week.  Home exercise guidelines will be given to patient during program as part of exercise prescription that the participant will acknowledge.  Education: Aerobic Exercise & Resistance Training: - Gives group verbal and written instruction on the various components of exercise. Focuses on aerobic and resistive training programs and the benefits of this training and how to safely progress through these programs..   Cardiac Rehab from  09/02/2018 in [facility] Cardiac and Pulmonary Rehab  Date 07/24/18  Educator AS  Instruction Review Code 1- Verbalizes Understanding      Education: Exercise & Equipment Safety: - Individual verbal instruction and demonstration of equipment use and safety with use of the equipment.   Pulmonary Rehab from 09/21/2020 in [facility] Cardiac and Pulmonary Rehab  Date 09/13/20  Educator AS  Instruction Review Code 1- Verbalizes Understanding      Education: Exercise Physiology & General Exercise Guidelines: - Group verbal and written instruction with models to review the exercise physiology of the cardiovascular system and associated critical values. Provides general exercise guidelines with specific guidelines to those with heart or lung disease.    Cardiac Rehab from 09/02/2018 in [facility] Cardiac and Pulmonary Rehab  Date 05/20/18  Educator AS  Instruction Review Code 4- No Evidence of Learning  [charting in error]      Education: Flexibility, Balance, Mind/Body Relaxation: Provides group verbal/written instruction on the benefits of flexibility and balance training, including mind/body exercise modes such as yoga, pilates and tai chi.  Demonstration and skill practice provided.   Cardiac Rehab from 09/02/2018 in [facility] Cardiac and Pulmonary Rehab  Date 07/29/18  Educator AS  Instruction Review Code 1- Verbalizes Understanding      Activity Barriers & Risk Stratification:  Activity Barriers & Cardiac Risk Stratification - 09/12/20 1113      Activity Barriers & Cardiac Risk Stratification   Activity Barriers Shortness of Breath           6 Minute Walk:  6 Minute Walk    Row Name 09/13/20 1415         6 Minute Walk   Phase Initial  Distance 815 feet     Walk Time 5.5 minutes     # of Rest Breaks 3     MPH 1.7     METS 1.8     RPE 16     Perceived Dyspnea  3     VO2 Peak 6.2     Symptoms Yes (comment)     Comments SOB     Resting HR 78 bpm     Resting BP 140/74     Resting  Oxygen Saturation  91 %     Exercise Oxygen Saturation  during 6 min walk 84 %     Max Ex. HR 104 bpm     Max Ex. BP 158/64     2 Minute Post BP 138/76       Interval HR   1 Minute HR 85     2 Minute HR 99     3 Minute HR 101     4 Minute HR 104     5 Minute HR 101     6 Minute HR 103     2 Minute Post HR 96     Interval Heart Rate? Yes       Interval Oxygen   Interval Oxygen? Yes     Baseline Oxygen Saturation % 91 %     1 Minute Oxygen Saturation % 85 %     1 Minute Liters of Oxygen 2 L     2 Minute Oxygen Saturation % 84 %     2 Minute Liters of Oxygen 2 L     3 Minute Oxygen Saturation % 84 %     3 Minute Liters of Oxygen 2 L     4 Minute Oxygen Saturation % 85 %     4 Minute Liters of Oxygen 2 L     5 Minute Oxygen Saturation % 85 %     5 Minute Liters of Oxygen 2 L     6 Minute Oxygen Saturation % 86 %     6 Minute Liters of Oxygen 2 L     2 Minute Post Oxygen Saturation % 85 %     2 Minute Post Liters of Oxygen 2 L           Oxygen Initial Assessment:  Oxygen Initial Assessment - 09/12/20 1111      Home Oxygen   Home Oxygen Device Home Concentrator;Portable Concentrator    Sleep Oxygen Prescription Continuous    Liters per minute 2    Home Exercise Oxygen Prescription None    Home Resting Oxygen Prescription Continuous   as needed   Liters per minute 2      Intervention   Short Term Goals To learn and exhibit compliance with exercise, home and travel O2 prescription;To learn and understand importance of monitoring SPO2 with pulse oximeter and demonstrate accurate use of the pulse oximeter.;To learn and understand importance of maintaining oxygen saturations>88%;To learn and demonstrate proper pursed lip breathing techniques or other breathing techniques.;To learn and demonstrate proper use of respiratory medications    Long  Term Goals Exhibits compliance with exercise, home and travel O2 prescription;Verbalizes importance of monitoring SPO2 with pulse  oximeter and return demonstration;Maintenance of O2 saturations>88%;Exhibits proper breathing techniques, such as pursed lip breathing or other method taught during program session;Compliance with respiratory medication;Demonstrates proper use of MDI's           Oxygen Re-Evaluation:  Oxygen Re-Evaluation    Row Name 09/19/20 1127  10/07/20 1113           Program Oxygen Prescription   Program Oxygen Prescription -- Continuous;E-Tanks      Liters per minute -- 3        Home Oxygen   Home Oxygen Device Home Concentrator;Portable Concentrator Home Concentrator;Portable Concentrator      Sleep Oxygen Prescription Continuous Continuous      Liters per minute 2 2      Home Exercise Oxygen Prescription None Continuous      Liters per minute -- 2      Home Resting Oxygen Prescription Continuous Continuous      Liters per minute 2 2      Compliance with Home Oxygen Use -- Yes        Goals/Expected Outcomes   Short Term Goals To learn and exhibit compliance with exercise, home and travel O2 prescription;To learn and understand importance of monitoring SPO2 with pulse oximeter and demonstrate accurate use of the pulse oximeter.;To learn and understand importance of maintaining oxygen saturations>88%;To learn and demonstrate proper pursed lip breathing techniques or other breathing techniques.;To learn and demonstrate proper use of respiratory medications To learn and understand importance of maintaining oxygen saturations>88%;To learn and understand importance of monitoring SPO2 with pulse oximeter and demonstrate accurate use of the pulse oximeter.      Long  Term Goals Exhibits compliance with exercise, home and travel O2 prescription;Verbalizes importance of monitoring SPO2 with pulse oximeter and return demonstration;Maintenance of O2 saturations>88%;Exhibits proper breathing techniques, such as pursed lip breathing or other method taught during program session;Compliance with respiratory  medication;Demonstrates proper use of MDI's Maintenance of O2 saturations>88%;Verbalizes importance of monitoring SPO2 with pulse oximeter and return demonstration      Comments Reviewed PLB technique with pt.  Talked about how it works and it's importance in maintaining their exercise saturations. He has a pulse oximeter to check his oxygen saturation at home. Informed and explained why it is important to have one. Reviewed that oxygen saturations should be 88 percent and above. Patient has a pulse oximeter at home to check his oxygen.      Goals/Expected Outcomes Short: Become more profiecient at using PLB.   Long: Become independent at using PLB. Short: monitor oxygen at home with exertion. Long: maintain oxygen saturations above 88 percent independently.             Oxygen Discharge (Final Oxygen Re-Evaluation):  Oxygen Re-Evaluation - 10/07/20 1113      Program Oxygen Prescription   Program Oxygen Prescription Continuous;E-Tanks    Liters per minute 3      Home Oxygen   Home Oxygen Device Home Concentrator;Portable Concentrator    Sleep Oxygen Prescription Continuous    Liters per minute 2    Home Exercise Oxygen Prescription Continuous    Liters per minute 2    Home Resting Oxygen Prescription Continuous    Liters per minute 2    Compliance with Home Oxygen Use Yes      Goals/Expected Outcomes   Short Term Goals To learn and understand importance of maintaining oxygen saturations>88%;To learn and understand importance of monitoring SPO2 with pulse oximeter and demonstrate accurate use of the pulse oximeter.    Long  Term Goals Maintenance of O2 saturations>88%;Verbalizes importance of monitoring SPO2 with pulse oximeter and return demonstration    Comments He has a pulse oximeter to check his oxygen saturation at home. Informed and explained why it is important to have one. Reviewed that oxygen saturations  should be 88 percent and above. Patient has a pulse oximeter at home to check  his oxygen.    Goals/Expected Outcomes Short: monitor oxygen at home with exertion. Long: maintain oxygen saturations above 88 percent independently.           Initial Exercise Prescription:  Initial Exercise Prescription - 09/13/20 1400      Date of Initial Exercise RX and Referring Provider   Date 09/13/20    Referring Provider Patsey Berthold      Recumbant Bike   Level 1    RPM 60    Minutes 15    METs 1.8      NuStep   Level 1    SPM 80    Minutes 15    METs 1.8      Arm Ergometer   Level 1    RPM 30    Minutes 15    METs 1.8      Prescription Details   Frequency (times per week) 3    Duration Progress to 30 minutes of continuous aerobic without signs/symptoms of physical distress      Intensity   THRR 40-80% of Max Heartrate 104-131    Ratings of Perceived Exertion 11-13    Perceived Dyspnea 0-4      Resistance Training   Training Prescription Yes    Weight 3 lb    Reps 10-15           Perform Capillary Blood Glucose checks as needed.  Exercise Prescription Changes:  Exercise Prescription Changes    Row Name 09/13/20 1400 09/22/20 0700 10/04/20 0800         Response to Exercise   Blood Pressure (Admit) 140/74 132/68 154/68     Blood Pressure (Exercise) 158/64 142/70 176/70     Blood Pressure (Exit) 138/76 132/64 144/74     Heart Rate (Admit) 78 bpm 83 bpm 79 bpm     Heart Rate (Exercise) 104 bpm 91 bpm 88 bpm     Heart Rate (Exit) 96 bpm 79 bpm 80 bpm     Oxygen Saturation (Admit) 91 % 83 % 91 %     Oxygen Saturation (Exercise) 84 % 89 % 92 %     Oxygen Saturation (Exit) 85 % 92 % 95 %     Rating of Perceived Exertion (Exercise) _0 Perceived Dyspnea (Exercise) -- 1 1     Symptoms SOB -- SOB     Duration -- Continue with 30 min of aerobic exercise without signs/symptoms of physical distress. Continue with 30 min of aerobic exercise without signs/symptoms of physical distress.     Intensity -- THRR unchanged THRR unchanged        Progression   Progression -- Continue to progress workloads to maintain intensity without signs/symptoms of physical distress. Continue to progress workloads to maintain intensity without signs/symptoms of physical distress.     Average METs -- 2.2 2.3       Resistance Training   Training Prescription -- Yes Yes     Weight -- 5 lb 5 lb     Reps -- 10-15 10-15       Interval Training   Interval Training -- -- No       Oxygen   Oxygen -- -- Continuous     Liters -- -- 3       NuStep   Level -- 1 6     SPM -- 80 --  Minutes -- 15 30     METs -- 2.2 2.3            Exercise Comments:   Exercise Goals and Review:  Exercise Goals    Row Name 09/13/20 1421             Exercise Goals   Increase Physical Activity Yes       Intervention Provide advice, education, support and counseling about physical activity/exercise needs.;Develop an individualized exercise prescription for aerobic and resistive training based on initial evaluation findings, risk stratification, comorbidities and participant's personal goals.       Expected Outcomes Short Term: Attend rehab on a regular basis to increase amount of physical activity.;Long Term: Add in home exercise to make exercise part of routine and to increase amount of physical activity.;Long Term: Exercising regularly at least 3-5 days a week.       Increase Strength and Stamina Yes       Intervention Provide advice, education, support and counseling about physical activity/exercise needs.;Develop an individualized exercise prescription for aerobic and resistive training based on initial evaluation findings, risk stratification, comorbidities and participant's personal goals.       Expected Outcomes Short Term: Increase workloads from initial exercise prescription for resistance, speed, and METs.;Short Term: Perform resistance training exercises routinely during rehab and add in resistance training at home;Long Term: Improve cardiorespiratory  fitness, muscular endurance and strength as measured by increased METs and functional capacity (6MWT)       Able to understand and use rate of perceived exertion (RPE) scale Yes       Intervention Provide education and explanation on how to use RPE scale       Expected Outcomes Short Term: Able to use RPE daily in rehab to express subjective intensity level;Long Term:  Able to use RPE to guide intensity level when exercising independently       Able to understand and use Dyspnea scale Yes       Intervention Provide education and explanation on how to use Dyspnea scale       Expected Outcomes Short Term: Able to use Dyspnea scale daily in rehab to express subjective sense of shortness of breath during exertion;Long Term: Able to use Dyspnea scale to guide intensity level when exercising independently       Knowledge and understanding of Target Heart Rate Range (THRR) Yes       Intervention Provide education and explanation of THRR including how the numbers were predicted and where they are located for reference       Expected Outcomes Short Term: Able to state/look up THRR;Short Term: Able to use daily as guideline for intensity in rehab;Long Term: Able to use THRR to govern intensity when exercising independently       Able to check pulse independently Yes       Intervention Provide education and demonstration on how to check pulse in carotid and radial arteries.;Review the importance of being able to check your own pulse for safety during independent exercise       Expected Outcomes Short Term: Able to explain why pulse checking is important during independent exercise;Long Term: Able to check pulse independently and accurately       Understanding of Exercise Prescription Yes       Intervention Provide education, explanation, and written materials on patient's individual exercise prescription       Expected Outcomes Short Term: Able to explain program exercise prescription;Long Term: Able to explain  home  exercise prescription to exercise independently              Exercise Goals Re-Evaluation :  Exercise Goals Re-Evaluation    Row Name 09/19/20 1126 09/22/20 0734 10/04/20 0854 10/07/20 1123       Exercise Goal Re-Evaluation   Exercise Goals Review Increase Physical Activity;Able to understand and use rate of perceived exertion (RPE) scale;Knowledge and understanding of Target Heart Rate Range (THRR);Understanding of Exercise Prescription;Increase Strength and Stamina;Able to understand and use Dyspnea scale;Able to check pulse independently Increase Physical Activity;Increase Strength and Stamina Increase Physical Activity;Increase Strength and Stamina;Understanding of Exercise Prescription Understanding of Exercise Prescription    Comments Reviewed RPE and dyspnea scales, THR and program prescription with pt today.  Pt voiced understanding and was given a copy of goals to take home. Abram is off to a good start in [place].  Staff will monitor progress. Stpehen has requested to just use the T4 NuStep as he does not like the other machines, they feel uncomfortable. He does alternate between using both arms and legs, just legs, and just arms.  He also has difficulty with his mask,but does better with the exercise mask.  We will continue to montior his progress on the NuStep. Raph states that he is interested in the [place] once he is done with the program. He is going to look into his insurance to see if he has [place]. Informed him that the gym has oxygen tanks and would be good to continue his exercise.    Expected Outcomes Short: Use RPE daily to regulate intensity. Long: Follow program prescription in THR. Short:  attend consistently Long:  improve stamina Short: Maintain spm consistently Long; Conitnue to improve stamina. Short: check with insurance if he has [employer] research [employer], government. Long: maintain an exercise regimine post [employer].           Discharge Exercise Prescription (Final  Exercise Prescription Changes):  Exercise Prescription Changes - 10/04/20 0800      Response to Exercise   Blood Pressure (Admit) 154/68    Blood Pressure (Exercise) 176/70    Blood Pressure (Exit) 144/74    Heart Rate (Admit) 79 bpm    Heart Rate (Exercise) 88 bpm    Heart Rate (Exit) 80 bpm    Oxygen Saturation (Admit) 91 %    Oxygen Saturation (Exercise) 92 %    Oxygen Saturation (Exit) 95 %    Rating of Perceived Exertion (Exercise) 11    Perceived Dyspnea (Exercise) 1    Symptoms SOB    Duration Continue with 30 min of aerobic exercise without signs/symptoms of physical distress.    Intensity THRR unchanged      Progression   Progression Continue to progress workloads to maintain intensity without signs/symptoms of physical distress.    Average METs 2.3      Resistance Training   Training Prescription Yes    Weight 5 lb    Reps 10-15      Interval Training   Interval Training No      Oxygen   Oxygen Continuous    Liters 3      NuStep   Level 6    Minutes 30    METs 2.3           Nutrition:  Target Goals: Understanding of nutrition guidelines, daily intake of sodium <1537m, cholesterol <2019m calories 30% from fat and 7% or less from saturated fats, daily to have 5 or more servings of fruits and vegetables.  Education:  Controlling Sodium/Reading Food Labels -Group verbal and written material supporting the discussion of sodium use in heart healthy nutrition. Review and explanation with models, verbal and written materials for utilization of the food label.   Cardiac Rehab from 09/02/2018 in [facility] Cardiac and Pulmonary Rehab  Date 05/13/18  Educator PI  Instruction Review Code 1- Verbalizes Understanding      Education: General Nutrition Guidelines/Fats and Fiber: -Group instruction provided by verbal, written material, models and posters to present the general guidelines for heart healthy nutrition. Gives an explanation and review of dietary fats and  fiber.   Cardiac Rehab from 09/02/2018 in [facility] Cardiac and Pulmonary Rehab  Date 08/26/18  Educator LB  Instruction Review Code 1- Verbalizes Understanding      Biometrics:  Pre Biometrics - 09/13/20 1422      Pre Biometrics   Height 5' 5" (1.651 m)    Weight 182 lb 14.4 oz (83 kg)    BMI (Calculated) 30.44    Single Leg Stand 10 seconds            Nutrition Therapy Plan and Nutrition Goals:  Nutrition Therapy & Goals - 10/17/20 1617      Personal Nutrition Goals   Nutrition Goal Unable to meet with pt yet.           Nutrition Assessments:  Nutrition Assessments - 09/13/20 1425      MEDFICTS Scores   Pre Score 65           MEDIFICTS Score Key:          ?70 Need to make dietary changes          40-70 Heart Healthy Diet         ? 40 Therapeutic Level Cholesterol Diet  Nutrition Goals Re-Evaluation:   Nutrition Goals Discharge (Final Nutrition Goals Re-Evaluation):   Psychosocial: Target Goals: Acknowledge presence or absence of significant depression and/or stress, maximize coping skills, provide positive support system. Participant is able to verbalize types and ability to use techniques and skills needed for reducing stress and depression.   Education: Depression - Provides group verbal and written instruction on the correlation between heart/lung disease and depressed mood, treatment options, and the stigmas associated with seeking treatment.   Pulmonary Rehab from 09/21/2020 in [facility] Cardiac and Pulmonary Rehab  Date 09/21/20  Educator [facility]  Instruction Review Code 1- Verbalizes Understanding      Education: Sleep Hygiene -Provides group verbal and written instruction about how sleep can affect your health.  Define sleep hygiene, discuss sleep cycles and impact of sleep habits. Review good sleep hygiene tips.    Cardiac Rehab from 09/02/2018 in [facility] Cardiac and Pulmonary Rehab  Date 08/19/18  Educator [facility]  Instruction Review Code 1- Verbalizes  Understanding  [Arrived late to class]      Education: Stress and Anxiety: - Provides group verbal and written instruction about the health risks of elevated stress and causes of high stress.  Discuss the correlation between heart/lung disease and anxiety and treatment options. Review healthy ways to manage with stress and anxiety.   Pulmonary Rehab from 09/21/2020 in [facility] Cardiac and Pulmonary Rehab  Date 09/21/20  Educator [facility]  Instruction Review Code 1- Verbalizes Understanding      Initial Review & Psychosocial Screening:  Initial Psych Review & Screening - 09/12/20 1116      Initial Review   Current issues with Current Anxiety/Panic      Family Dynamics   Good Support System? Yes  Lolly Mustache, 6 years, family     Barriers   Psychosocial barriers to participate in program There are no identifiable barriers or psychosocial needs.;The patient should benefit from training in stress management and relaxation.;Psychosocial barriers identified (see note)      Screening Interventions   Interventions Encouraged to exercise    Expected Outcomes Short Term goal: Utilizing psychosocial counselor, staff and physician to assist with identification of specific Stressors or current issues interfering with healing process. Setting desired goal for each stressor or current issue identified.;Long Term Goal: Stressors or current issues are controlled or eliminated.;Short Term goal: Identification and review with participant of any Quality of Life or Depression concerns found by scoring the questionnaire.;Long Term goal: The participant improves quality of Life and PHQ9 Scores as seen by post scores and/or verbalization of changes           Quality of Life Scores:  Scores of 19 and below usually indicate a poorer quality of life in these areas.  A difference of  2-3 points is a clinically meaningful difference.  A difference of 2-3 points in the total score of the Quality of Life Index has been  associated with significant improvement in overall quality of life, self-image, physical symptoms, and general health in studies assessing change in quality of life.  PHQ-9: Recent Review Flowsheet Data    Depression screen [facility] 2/9 09/13/2020 08/26/2018 05/01/2018   Decreased Interest 0 0 0   Down, Depressed, Hopeless 0 0 0   PHQ - 2 Score 0 0 0   Altered sleeping 0 0 0   Tired, decreased energy 1 1 0   Change in appetite 0 - 0   Feeling bad or failure about yourself  0 0 0   Trouble concentrating 0 0 0   Moving slowly or fidgety/restless 0 0 0   Suicidal thoughts 0 0 0   PHQ-9 Score 1 1 0   Difficult doing work/chores Not difficult at all Not difficult at all Not difficult at all     Interpretation of Total Score  Total Score Depression Severity:  1-4 = Minimal depression, 5-9 = Mild depression, 10-14 = Moderate depression, 15-19 = Moderately severe depression, 20-27 = Severe depression   Psychosocial Evaluation and Intervention:  Psychosocial Evaluation - 09/12/20 1136      Psychosocial Evaluation & Interventions   Comments Verdon doe not have any barriers to attending the program.  He remembers attending Cardiac Rehab several years ago and is looking forward to starting Pulmonary Rehab. He does get anxiety at time when his breathing is hard. He has learned to slow down, and reset to help get his breathing back to his normal. He lives with his Herma Carson and their 2 dogs. He has a great support system with Arbie Cookey and his family. He continues to be active, traveling and getting out his bike. He has purchased his own portable oxygen concentrator for at home and traveling. He should do great in the program.    Expected Outcomes STG: Jamieson will attend all scheduled sessions to gain the most from the program. LTG: Leovanni will see improvement in his ADL activity and be able to maintain this improvement after discharge.    Continue Psychosocial Services  Follow up required by staff            Psychosocial Re-Evaluation:  Psychosocial Re-Evaluation    [place] Name 10/07/20 1118             Psychosocial Re-Evaluation  Current issues with Current Stress Concerns;Current Anxiety/Panic       Comments Corbet gets anxious when he gets short of breath. His breathing is the only issue that gets him down. He states that his mind is sharp and is willing to get healthier to breath easier.       Expected Outcomes Short: continue [facility] to improve shortness of breath. Long: maintain exercise to keep stress at a minimum.       Interventions Encouraged to attend Pulmonary Rehabilitation for the exercise       Continue Psychosocial Services  Follow up required by staff              Psychosocial Discharge (Final Psychosocial Re-Evaluation):  Psychosocial Re-Evaluation - 10/07/20 1118      Psychosocial Re-Evaluation   Current issues with Current Stress Concerns;Current Anxiety/Panic    Comments Lukus gets anxious when he gets short of breath. His breathing is the only issue that gets him down. He states that his mind is sharp and is willing to get healthier to breath easier.    Expected Outcomes Short: continue [facility] to improve shortness of breath. Long: maintain exercise to keep stress at a minimum.    Interventions Encouraged to attend Pulmonary Rehabilitation for the exercise    Continue Psychosocial Services  Follow up required by staff           Education: Education Goals: Education classes will be provided on a weekly basis, covering required topics. Participant will state understanding/return demonstration of topics presented.  Learning Barriers/Preferences:  Learning Barriers/Preferences - 09/12/20 1121      Learning Barriers/Preferences   Learning Barriers None    Learning Preferences None           General Pulmonary Education Topics:  Infection Prevention: - Provides verbal and written material to individual with discussion of infection control including  proper hand washing and proper equipment cleaning during exercise session.   Pulmonary Rehab from 09/21/2020 in [facility] Cardiac and Pulmonary Rehab  Date 09/13/20  Educator AS  Instruction Review Code 1- Verbalizes Understanding      Falls Prevention: - Provides verbal and written material to individual with discussion of falls prevention and safety.   Pulmonary Rehab from 09/21/2020 in [facility] Cardiac and Pulmonary Rehab  Date 09/13/20  Educator AS  Instruction Review Code 1- Verbalizes Understanding      Chronic Lung Diseases: - Group verbal and written instruction to review updates, respiratory medications, advancements in procedures and treatments. Discuss use of supplemental oxygen including available portable oxygen systems, continuous and intermittent flow rates, concentrators, personal use and safety guidelines. Review proper use of inhaler and spacers. Provide informative websites for self-education.    Energy Conservation: - Provide group verbal and written instruction for methods to conserve energy, plan and organize activities. Instruct on pacing techniques, use of adaptive equipment and posture/positioning to relieve shortness of breath.   Triggers and Exacerbations: - Group verbal and written instruction to review types of environmental triggers and ways to prevent exacerbations. Discuss weather changes, air quality and the benefits of nasal washing. Review warning signs and symptoms to help prevent infections. Discuss techniques for effective airway clearance, coughing, and vibrations.   AED/CPR: - Group verbal and written instruction with the use of models to demonstrate the basic use of the AED with the basic ABC's of resuscitation.   Cardiac Rehab from 09/02/2018 in [facility] Cardiac and Pulmonary Rehab  Date 09/02/18  Educator [place]  Instruction Review Code 1- Verbalizes Understanding  Anatomy and Physiology of the Lungs: - Group verbal and written instruction with the  use of models to provide basic lung anatomy and physiology related to function, structure and complications of lung disease.   Anatomy & Physiology of the Heart: - Group verbal and written instruction and models provide basic cardiac anatomy and physiology, with the coronary electrical and arterial systems. Review of Valvular disease and Heart Failure   Cardiac Rehab from 09/02/2018 in [facility] Cardiac and Pulmonary Rehab  Date 08/07/18  Educator CE  Instruction Review Code 1- Verbalizes Understanding      Cardiac Medications: - Group verbal and written instruction to review commonly prescribed medications for heart disease. Reviews the medication, class of the drug, and side effects.   Cardiac Rehab from 09/02/2018 in [facility] Cardiac and Pulmonary Rehab  Date 08/12/18  Educator SB  Instruction Review Code 1- Verbalizes Understanding      Other: -Provides group and verbal instruction on various topics (see comments)   Knowledge Questionnaire Score:  Knowledge Questionnaire Score - 09/13/20 1424      Knowledge Questionnaire Score   Pre Score 16/18            Core Components/Risk Factors/Patient Goals at Admission:  Personal Goals and Risk Factors at Admission - 09/13/20 1426      Core Components/Risk Factors/Patient Goals on Admission    Weight Management Yes;Weight Loss    Intervention Weight Management: Develop a combined nutrition and exercise program designed to reach desired caloric intake, while maintaining appropriate intake of nutrient and fiber, sodium and fats, and appropriate energy expenditure required for the weight goal.    Admit Weight 182 lb 14.4 oz (83 kg)    Goal Weight: Short Term 180 lb (81.6 kg)    Goal Weight: Long Term 175 lb (79.4 kg)    Expected Outcomes Short Term: Continue to assess and modify interventions until short term weight is achieved;Long Term: Adherence to nutrition and physical activity/exercise program aimed toward attainment of established  weight goal;Weight Loss: Understanding of general recommendations for a balanced deficit meal plan, which promotes 1-2 lb weight loss per week and includes a negative energy balance of [phone] kcal/d    Diabetes Yes    Intervention Provide education about signs/symptoms and action to take for hypo/hyperglycemia.;Provide education about proper nutrition, including hydration, and aerobic/resistive exercise prescription along with prescribed medications to achieve blood glucose in normal ranges: Fasting glucose 65-99 mg/dL    Expected Outcomes Short Term: Participant verbalizes understanding of the signs/symptoms and immediate care of hyper/hypoglycemia, proper foot care and importance of medication, aerobic/resistive exercise and nutrition plan for blood glucose control.;Long Term: Attainment of HbA1C < 7%.    Hypertension Yes    Intervention Provide education on lifestyle modifcations including regular physical activity/exercise, weight management, moderate sodium restriction and increased consumption of fresh fruit, vegetables, and low fat dairy, alcohol moderation, and smoking cessation.;Monitor prescription use compliance.    Expected Outcomes Short Term: Continued assessment and intervention until BP is < 140/48m HG in hypertensive participants. < 130/823mHG in hypertensive participants with diabetes, heart failure or chronic kidney disease.;Long Term: Maintenance of blood pressure at goal levels.    Lipids Yes    Intervention Provide education and support for participant on nutrition & aerobic/resistive exercise along with prescribed medications to achieve LDL <7018mHDL >67m44m  Expected Outcomes Short Term: Participant states understanding of desired cholesterol values and is compliant with medications prescribed. Participant is following exercise prescription and nutrition guidelines.;Long Term:  Cholesterol controlled with medications as prescribed, with individualized exercise RX and with  personalized nutrition plan. Value goals: LDL < 8m, HDL > 40 mg.    Personal Goal Other Yes           Education:Diabetes - Individual verbal and written instruction to review signs/symptoms of diabetes, desired ranges of glucose level fasting, after meals and with exercise. Acknowledge that pre and post exercise glucose checks will be done for 3 sessions at entry of program.   [facility]  Date 05/01/18  [facility] Instruction Review Code 1- VUnited States Steel CorporationUnderstanding      Education: Know Your Numbers and Risk Factors: -Group verbal and written instruction about important numbers in your health.  Discussion of what are risk factors and how they play a role in the disease process.  Review of Cholesterol, Blood Pressure, Diabetes, and BMI and the role they play in your overall health.   [facility]  Date 07/31/18  Educator CE  Instruction Review Code 1- Verbalizes Understanding      Core Components/Risk Factors/Patient Goals Review:   Goals and Risk Factor Review    Row Name 10/07/20 1116             Core Components/Risk Factors/Patient Goals Review   Personal Goals Review Improve shortness of breath with ADL's       Review If he is doing chores at home and if things are too strennuous he get anxious and short of breath.Spoke to patient about their shortness of breath and what they can do to improve. Patient has been informed of breathing techniques when starting the program. Patient is informed to tell staff if they have had any med changes and that certain meds they are taking or not taking can be causing shortness of breath.       Expected Outcomes Short: Attend [facility] regularly to improve shortness of breath with ADL's. Long: maintain independence with ADL's              Core Components/Risk Factors/Patient Goals at Discharge (Final Review):   Goals and Risk  Factor Review - 10/07/20 1116      Core Components/Risk Factors/Patient Goals Review   Personal Goals Review Improve shortness of breath with ADL's    Review If he is doing chores at home and if things are too strennuous he get anxious and short of breath.Spoke to patient about their shortness of breath and what they can do to improve. Patient has been informed of breathing techniques when starting the program. Patient is informed to tell staff if they have had any med changes and that certain meds they are taking or not taking can be causing shortness of breath.    Expected Outcomes Short: Attend [facility] regularly to improve shortness of breath with ADL's. Long: maintain independence with ADL's           ITP Comments:  ITP Comments    Row Name 09/12/20 1129 09/13/20 1438 09/19/20 1125 09/21/20 0618 10/19/20 0717   ITP Comments Virtual orientation call completed today. he has an appointment on Date: [id number] EP eval and gym Orientation.  [facility] Completed 6MWT and gym orientation. Initial ITP created and sent for review to Dr. MEmily Filbert Medical Director. First full day of exercise!  Patient was oriented to gym and equipment including functions, settings, policies, and procedures.  Patient's individual exercise prescription and treatment plan were reviewed.  All starting workloads were established based on the results of the 6 minute walk test done at initial orientation visit.  The plan for exercise progression was also introduced and progression will be customized based on patient's performance and goals. 30 Day review completed. Medical Director ITP review done, changes made as directed, and signed approval by Medical Director. 30 Day review completed. Medical Director ITP review done, changes made as directed, and signed approval by Medical Director.          Comments:

## 2020-10-19 NOTE — Progress Notes (Signed)
Daily Session Note  Patient Details  Name: Harold Parker MRN: [id number] Date of Birth: 16-Oct-1946 Referring Provider:     Pulmonary Rehab from 09/13/2020 in [facility] Cardiac and Pulmonary Rehab  Referring Provider Patsey Berthold      Encounter Date: 10/19/2020  Check In:  Session Check In - 10/19/20 1125      Check-In   Supervising physician immediately available to respond to emergencies See telemetry face sheet for immediately available ER MD    Location [facility]-Cardiac & Pulmonary Rehab    Staff Present Birdie Sons, MPA, RN;Joseph Darrin Nipper, [place], RCEP, CCRP, CCET    Virtual Visit No    Medication changes reported     No    Fall or balance concerns reported    No    Warm-up and Cool-down Performed on first and last piece of equipment    Resistance Training Performed Yes    VAD Patient? No    PAD/SET Patient? No      Pain Assessment   Currently in Pain? No/denies              Social History   Tobacco Use  Smoking Status Former Smoker  . Packs/day: 1.00  . Years: 40.00  . Pack years: 40.00  . Types: Cigarettes  . Quit date: 02/02/2018  . Years since quitting: 2.7  Smokeless Tobacco Never Used  Tobacco Comment   Quit 01/2018 - on 05/01/2018 talked to Harold Parker about Relaspe concerns. He ststaes that he has no taste for tobacco since he quit in Feb.    Goals Met:  Independence with exercise equipment Exercise tolerated well No report of cardiac concerns or symptoms Strength training completed today  Goals Unmet:  Not Applicable  Comments: Pt able to follow exercise prescription today without complaint.  Will continue to monitor for progression.    Dr. Emily Filbert is Medical Director for [place] and [facility].

## 2020-10-31 ENCOUNTER — Telehealth: Payer: Self-pay

## 2020-10-31 NOTE — Telephone Encounter (Signed)
Harold Parker called and told staff he had a bad day with BG yesterday - his BG spiked to over 400 then he took 2 metformin and it dropped to below 50.  He did not feel like exercising today.  Staff recommended he call his Dr. Today and he agreed to call.

## 2020-11-02 ENCOUNTER — Other Ambulatory Visit: Payer: Self-pay

## 2020-11-02 DIAGNOSIS — J449 Chronic obstructive pulmonary disease, unspecified: Secondary | ICD-10-CM | POA: Diagnosis not present

## 2020-11-02 NOTE — Progress Notes (Signed)
Daily Session Note  Patient Details  Name: Harold Parker MRN: [id number] Date of Birth: 1946/09/06 Referring Provider:     Pulmonary Rehab from 09/13/2020 in [facility] Cardiac and Pulmonary Rehab  Referring Provider Patsey Berthold      Encounter Date: 11/02/2020  Check In:  Session Check In - 11/02/20 1111      Check-In   Supervising physician immediately available to respond to emergencies See telemetry face sheet for immediately available ER MD    Location [facility]-Cardiac & Pulmonary Rehab    Staff Present Birdie Sons, MPA, Elveria Rising, BA, ACSM CEP, Exercise Physiologist;Joseph Tessie Fass RCP,RRT,BSRT    Virtual Visit No    Medication changes reported     No    Fall or balance concerns reported    No    Warm-up and Cool-down Performed on first and last piece of equipment    Resistance Training Performed Yes    VAD Patient? No    PAD/SET Patient? No      Pain Assessment   Currently in Pain? No/denies              Social History   Tobacco Use  Smoking Status Former Smoker  . Packs/day: 1.00  . Years: 40.00  . Pack years: 40.00  . Types: Cigarettes  . Quit date: 02/02/2018  . Years since quitting: 2.7  Smokeless Tobacco Never Used  Tobacco Comment   Quit 01/2018 - on 05/01/2018 talked to Finis about Relaspe concerns. He ststaes that he has no taste for tobacco since he quit in Feb.    Goals Met:  Independence with exercise equipment Exercise tolerated well Personal goals reviewed No report of cardiac concerns or symptoms Strength training completed today  Goals Unmet:  Not Applicable  Comments: Pt able to follow exercise prescription today without complaint.  Will continue to monitor for progression.  Reviewed home exercise with pt today.  Pt plans to walk/bike, [facility].  Reviewed THR, pulse, RPE, sign and symptoms, pulse oximetery and when to call 911 or MD.  Also discussed weather considerations and indoor options.  Pt voiced  understanding.   Dr. Emily Filbert is Medical Director for [place] and [facility].

## 2020-11-04 ENCOUNTER — Other Ambulatory Visit: Payer: Self-pay

## 2020-11-04 DIAGNOSIS — J449 Chronic obstructive pulmonary disease, unspecified: Secondary | ICD-10-CM | POA: Diagnosis not present

## 2020-11-04 NOTE — Progress Notes (Signed)
Daily Session Note  Patient Details  Name: Maleko Greulich MRN: [id number] Date of Birth: 06-16-1946 Referring Provider:     Pulmonary Rehab from 09/13/2020 in [facility] Re[facility]  Referring Provider Patsey Berthold      Encounter Date: 11/04/2020  Check In:  Session Check In - 11/04/20 1101      Check-In   Supervising physician immediately available to respond to emergencies See telemetry face sheet for immediately available ER MD    Location [facility]-Cardiac & Pulmonary Rehab    Staff Present Birdie Sons, MPA, RN;Meredith Sherryll Burger, RN BSN;Joseph Darrin Nipper, [place], RCEP, CCRP, CCET    Virtual Visit No    Medication changes reported     No    Fall or balance concerns reported    No    Warm-up and Cool-down Performed on first and last piece of equipment    Resistance Training Performed Yes    VAD Patient? No    PAD/SET Patient? No      Pain Assessment   Currently in Pain? No/denies              Social History   Tobacco Use  Smoking Status Former Smoker  . Packs/day: 1.00  . Years: 40.00  . Pack years: 40.00  . Types: Cigarettes  . Quit date: 02/02/2018  . Years since quitting: 2.7  Smokeless Tobacco Never Used  Tobacco Comment   Quit 01/2018 - on 05/01/2018 talked to Tahj about Relaspe concerns. He ststaes that he has no taste for tobacco since he quit in Feb.    Goals Met:  Independence with exercise equipment Exercise tolerated well No report of cardiac concerns or symptoms Strength training completed today  Goals Unmet:  Not Applicable  Comments: Pt able to follow exercise prescription today without complaint.  Will continue to monitor for progression.    Dr. Emily Filbert is Medical Director for [place] and [facility].

## 2020-11-07 ENCOUNTER — Other Ambulatory Visit: Payer: Self-pay

## 2020-11-07 ENCOUNTER — Encounter: Payer: Medicare Other | Admitting: *Deleted

## 2020-11-07 DIAGNOSIS — J449 Chronic obstructive pulmonary disease, unspecified: Secondary | ICD-10-CM

## 2020-11-07 NOTE — Progress Notes (Signed)
Daily Session Note  Patient Details  Name: Harold Parker MRN: [id number] Date of Birth: 06/29/1946 Referring Provider:     Pulmonary Rehab from 09/13/2020 in [facility] Re[facility]  Referring Provider Gonzalez      Encounter Date: 11/07/2020  Check In:  Session Check In - 11/07/20 1112      Check-In   Supervising physician immediately available to respond to emergencies See telemetry face sheet for immediately available ER MD    Location [facility]    Staff Present Meredith Craven, RN BSN;Joseph Hood RCP,RRT,BSRT;Kelly Hayes, BS, ACSM CEP, Exercise Physiologist;Kara Langdon, MS Exercise Physiologist    Virtual Visit No    Medication changes reported     No    Fall or balance concerns reported    No    Warm-up and Cool-down Performed on first and last piece of equipment    Resistance Training Performed Yes    VAD Patient? No    PAD/SET Patient? No      Pain Assessment   Currently in Pain? No/denies              Social History   Tobacco Use  Smoking Status Former Smoker  . Packs/day: 1.00  . Years: 40.00  . Pack years: 40.00  . Types: Cigarettes  . Quit date: 02/02/2018  . Years since quitting: 2.7  Smokeless Tobacco Never Used  Tobacco Comment   Quit 01/2018 - on 05/01/2018 talked to Osman about Relaspe concerns. He ststaes that he has no taste for tobacco since he quit in Feb.    Goals Met:  Independence with exercise equipment Exercise tolerated well No report of cardiac concerns or symptoms Strength training completed today  Goals Unmet:  Not Applicable  Comments: Pt able to follow exercise prescription today without complaint.  Will continue to monitor for progression.    Dr. Mark Miller is Medical Director for [facility] and [facility]. 

## 2020-11-14 ENCOUNTER — Other Ambulatory Visit: Payer: Self-pay

## 2020-11-14 DIAGNOSIS — J449 Chronic obstructive pulmonary disease, unspecified: Secondary | ICD-10-CM

## 2020-11-14 NOTE — Progress Notes (Signed)
Daily Session Note  Patient Details  Name: Harold Parker MRN: [id number] Date of Birth: 1946-09-10 Referring Provider:     Pulmonary Rehab from 09/13/2020 in [facility] Cardiac and Pulmonary Rehab  Referring Provider Harold Parker      Encounter Date: 11/14/2020  Check In:  Session Check In - 11/14/20 1135      Check-In   Supervising physician immediately available to respond to emergencies See telemetry face sheet for immediately available ER MD    Location [facility]    Staff Present Harold Parker, MPA, Harold Parker, BS, ACSM CEP, Exercise Physiologist;Harold Alcus Dad, RN BSN    Virtual Visit No    Medication changes reported     No    Fall or balance concerns reported    No    Warm-up and Cool-down Performed on first and last piece of equipment    Resistance Training Performed Yes    VAD Patient? No    PAD/SET Patient? No      Pain Assessment   Currently in Pain? No/denies              Social History   Tobacco Use  Smoking Status Former Smoker  . Packs/day: 1.00  . Years: 40.00  . Pack years: 40.00  . Types: Cigarettes  . Quit date: 02/02/2018  . Years since quitting: 2.7  Smokeless Tobacco Never Used  Tobacco Comment   Quit 01/2018 - on 05/01/2018 talked to Harold Parker about Relaspe concerns. He ststaes that he has no taste for tobacco since he quit in Feb.    Goals Met:  Independence with exercise equipment Exercise tolerated well No report of cardiac concerns or symptoms Strength training completed today  Goals Unmet:  Not Applicable  Comments: Pt able to follow exercise prescription today without complaint.  Will continue to monitor for progression.    Dr. Emily Parker is Medical Director for [place] and [facility].

## 2020-11-16 ENCOUNTER — Encounter: Payer: Self-pay | Admitting: *Deleted

## 2020-11-16 ENCOUNTER — Encounter: Payer: Medicare Other | Attending: Pulmonary Disease

## 2020-11-16 ENCOUNTER — Other Ambulatory Visit: Payer: Self-pay

## 2020-11-16 DIAGNOSIS — J449 Chronic obstructive pulmonary disease, unspecified: Secondary | ICD-10-CM | POA: Diagnosis present

## 2020-11-16 NOTE — Progress Notes (Signed)
Daily Session Note  Patient Details  Name: Harold Parker MRN: [id number] Date of Birth: 1946/01/11 Referring Provider:     Pulmonary Rehab from 09/13/2020 in [facility] Cardiac [facility] Pulmonary Rehab  Referring Provider Harold Parker      Encounter Date: 11/16/2020  Check In:  Session Check In - 11/16/20 1122      Check-In   Supervising physician immediately available to respond to emergencies See telemetry face sheet for immediately available ER MD    Location [facility]    Staff Present Harold Parker, MPA, Harold Parker, BA, ACSM CEP, Exercise Physiologist;Harold Parker RDN, LDN    Virtual Visit No    Medication changes reported     No    Fall or balance concerns reported    No    Warm-up and Cool-down Performed on first and last piece of equipment    Resistance Training Performed Yes    VAD Patient? No    PAD/SET Patient? No      Pain Assessment   Currently in Pain? No/denies              Social History   Tobacco Use  Smoking Status Former Smoker  . Packs/day: 1.00  . Years: 40.00  . Pack years: 40.00  . Types: Cigarettes  . Quit date: 02/02/2018  . Years since quitting: 2.7  Smokeless Tobacco Never Used  Tobacco Comment   Quit 01/2018 - on 05/01/2018 talked to Harold Parker about Relaspe concerns. He ststaes that he has no taste for tobacco since he quit in Feb.    Goals Met:  Independence with exercise equipment Exercise tolerated well No report of cardiac concerns or symptoms Strength training completed today  Goals Unmet:  Not Applicable  Comments: Pt able to follow exercise prescription today without complaint.  Will continue to monitor for progression.    Dr. Emily Filbert is Medical Director for [place] and [facility].

## 2020-11-16 NOTE — Progress Notes (Signed)
Pulmonary Individual Treatment Plan  Patient Details  Name: Harold Parker MRN: [id number] Date of Birth: 1946-08-07 Referring Provider:     Pulmonary Rehab from 09/13/2020 in [facility] Cardiac and Pulmonary Rehab  Referring Provider Patsey Berthold      Initial Encounter Date:    Pulmonary Rehab from 09/13/2020 in [facility] Cardiac and Pulmonary Rehab  Date 09/13/20      Visit Diagnosis: Chronic obstructive pulmonary disease, unspecified COPD type ([place])  Patient's Home Medications on Admission:  Current Outpatient Medications:  .  acetaminophen (TYLENOL) 500 MG tablet, Take 1,000 mg by mouth daily as needed for moderate pain or headache., Disp: , Rfl:  .  ALPRAZolam (XANAX) 0.5 MG tablet, Take 0.5 mg by mouth 2 (two) times daily. , Disp: , Rfl:  .  amLODipine (NORVASC) 5 MG tablet, Take 5 mg by mouth daily., Disp: , Rfl:  .  Ascorbic Acid (VITAMIN C) 1000 MG tablet, Take 1,000 mg by mouth daily., Disp: , Rfl:  .  aspirin EC 81 MG tablet, Take 81 mg by mouth daily., Disp: , Rfl:  .  atorvastatin (LIPITOR) 80 MG tablet, Take 80 mg by mouth daily., Disp: , Rfl:  .  Budeson-Glycopyrrol-Formoterol (BREZTRI AEROSPHERE) 160-9-4.8 MCG/ACT AERO, Inhale 2 puffs into the lungs 2 (two) times daily., Disp: 10.7 g, Rfl: 11 .  esomeprazole (NEXIUM) 40 MG capsule, Take 40 mg by mouth at bedtime. , Disp: , Rfl:  .  ezetimibe (ZETIA) 10 MG tablet, Take 10 mg by mouth at bedtime. , Disp: , Rfl:  .  folic acid (FOLVITE) 572 MCG tablet, Take 400 mcg by mouth every evening., Disp: , Rfl:  .  furosemide (LASIX) 40 MG tablet, TAKE 1 TABLET BY MOUTH EVERY DAY, Disp: 90 tablet, Rfl: 2 .  glipiZIDE (GLUCOTROL) 5 MG tablet, Take 0.5 tablets (2.5 mg total) by mouth daily before breakfast. (Patient taking differently: Take 5 mg by mouth See admin instructions. Take 5 mg in the morning and take a second 5 mg dose at night on Mon, Wed, and Fri), Disp: 30 tablet, Rfl: 1 .  ipratropium-albuterol (DUONEB) 0.5-2.5 (3) MG/3ML SOLN, Take 3  mLs by nebulization every 6 (six) hours as needed. (Patient taking differently: Take 3 mLs by nebulization every 6 (six) hours as needed (shortness of breath). ), Disp: 360 mL, Rfl: 1 .  losartan (COZAAR) 100 MG tablet, TAKE 1 TABLET BY MOUTH EVERY DAY Please call to schedule appointment for further refills. Thank you! (Patient taking differently: 50 mg. TAKE 1 TABLET BY MOUTH EVERY DAY Please call to schedule appointment for further refills. Thank you!), Disp: 90 tablet, Rfl: 0 .  Melatonin 10 MG CAPS, Take 10 mg by mouth at bedtime., Disp: , Rfl:  .  metFORMIN (GLUCOPHAGE) 1000 MG tablet, TAKE 1 TABLET BY MOUTH TWICE DAILY WITH A MEAL (Patient taking differently: Take 1,000 mg by mouth 2 (two) times daily. ), Disp: 60 tablet, Rfl: 0 .  metoprolol succinate (TOPROL-XL) 100 MG 24 hr tablet, TAKE 1 TABLET BY MOUTH EVERY DAY WITH OR IMMEDIATELY FOLLOWING A MEAL, Disp: 90 tablet, Rfl: 1 .  PROAIR HFA 108 (90 Base) MCG/ACT inhaler, INL 2 PFS PO Q 6 H PRN, Disp: , Rfl:  .  Vitamin D, Cholecalciferol, 1000 units TABS, Take 1,000 Units by mouth every morning. , Disp: , Rfl:   Past Medical History: Past Medical History:  Diagnosis Date  . Bilateral carotid bruits    a. 01/2018 U/S: < 50% bilat ICA stenosis.  Marland Kitchen CAD (coronary  artery disease)    a. 1998 s/p MI and BMS [facility], [place]); b. 1999 redo PCI/rotablator in setting of what sounds like ISR;  c. Multiple stress tests over the years - last ~ 2017, reportedly nl; d. 01/2018 NSTEMI/Cath: LM 29md, LAD 50p, 40p/m, D1 60ost, OM1 95, RCA 100ost/p w/ L->R collats, EF 45%; e. s/p 3V CABG 02/19/18 (LIMA-LAD, VG-D1, VG-OM)  . Chronic lower back pain   . COPD [place]ronic obstructive pulmonary disease[place]   . GIB (gastrointestinal bleeding)    a. 02/2018 GIB and anemia w/ Hgb of 4.7 on presentation; b. 03/2018 EGD: 2 nonbleeding duodenal ulcers.  .Marland KitchenHTN (hypertension)   . Hypercholesteremia   . Ischemic cardiomyopathy    a. 01/2018 Echo: EF 40-45%,  mid-apicalanteroseptal, ant, apical sev HK, mod apicalinferior HK. Gr2 DD. Mod AS, mild MR, mod dil LA, PASP 574mg.  . Moderate aortic stenosis    a. 01/2018 Echo: Mod AS, mean grad (S) 2058m, Valve area (VTI) 1.06 cm^2, (Vmax) 1.27 cm^2; b. 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  . Myocardial infarction ([facility] 1998[place]  . 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 02/19/2018 AVR: 25 mm Edwards Inspiris Resilia stented bovine pericardial tissue valve  . 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 Tobacco abuse   . Type II diabetes mellitus ([facility])     Tobacco Use: Social History   Tobacco Use  Smoking Status Former Smoker  . Packs/day: 1.00  . Years: 40.00  . Pack years: 40.00  . Types: Cigarettes  . Quit date: 02/02/2018  . Years since quitting: 2.7  Smokeless Tobacco Never Used  Tobacco Comment   Quit 01/2018 - on 05/01/2018 talked to RalAmontaeout Relaspe concerns. He ststaes that he has no taste for tobacco since he quit in Feb.    Labs: Recent Review Flowsheet Data    Labs for ITP Cardiac and Pulmonary Rehab Latest Ref Rng & Units 02/19/2018 02/19/2018 02/20/2018 02/20/2018 03/27/2018   Cholestrol 100 - 199 mg/dL - - - - 102   LDLCALC 0 - 99 mg/dL - - - - 50   HDL >39 mg/dL - - - - 35(L)   Trlycerides 0 - 149 mg/dL - - - - 84   Hemoglobin A1c 4.8 - 5.6 % - - - - -   PHART 7.35 - 7.45 - 7.335(L) 7.361 7.331(L) -   PCO2ART 32 - 48 mmHg - 40.2 40.1 41.0 -   HCO3 20.0 - 28.0 mmol/L - 21.2 22.5 21.5 -   TCO2 22 - 32 mmol/L _0 -   ACIDBASEDEF 0.0 - 2.0 mmol/L - 4.0(H) 2.0 4.0(H) -   O2SAT % - 97.0 97.0 95.0 -       Pulmonary Assessment Scores:  Pulmonary Assessment Scores    Row Name 09/13/20 1422         ADL UCSD   SOB Score total 21     Rest 0     Walk 2     Stairs 3     Bath 2     Dress 0     Shop 0       CAT Score   CAT Score 16       mMRC Score   mMRC Score 3             UCSD: Self-administered rating of dyspnea associated with activities of  daily living (ADLs) 6-point scale (0 = "not at all" to 5 = "maximal or unable to do because of breathlessness")  Scoring Scores range from 0 to 120.  Minimally important difference is 5 units  CAT: CAT can identify the health impairment of COPD patients and is better correlated with disease progression.  CAT has a scoring range of zero to 40. The CAT score is classified into four groups of low (less than 10), medium (10 - 20), high (21-30) and very high (31-40) based on the impact level of disease on health status. A CAT score over 10 suggests significant symptoms.  A worsening CAT score could be explained by an exacerbation, poor medication adherence, poor inhaler technique, or progression of COPD or comorbid conditions.  CAT MCID is 2 points  mMRC: mMRC (Modified Medical Research Council) Dyspnea Scale is used to assess the degree of baseline functional disability in patients of respiratory disease due to dyspnea. No minimal important difference is established. A decrease in score of 1 point or greater is considered a positive change.   Pulmonary Function Assessment:   Exercise Target Goals: Exercise Program Goal: Individual exercise prescription set using results from initial 6 min walk test and THRR while considering  patient's activity barriers and safety.   Exercise Prescription Goal: Initial exercise prescription builds to 30-45 minutes a day of aerobic activity, 2-3 days per week.  Home exercise guidelines will be given to patient during program as part of exercise prescription that the participant will acknowledge.  Education: Aerobic Exercise: - Group verbal and visual presentation on the components of exercise prescription. Introduces F.I.T.T principle from ACSM for exercise prescriptions.  Reviews F.I.T.T. principles of aerobic exercise including progression. Written material given at graduation.   Cardiac  Rehab from 09/02/2018 in [facility] Cardiac and Pulmonary Rehab  Date 07/24/18  Educator AS  Instruction Review Code 1- Verbalizes Understanding      Education: Resistance Exercise: - Group verbal and visual presentation on the components of exercise prescription. Introduces F.I.T.T principle from ACSM for exercise prescriptions  Reviews F.I.T.T. principles of resistance exercise including progression. Written material given at graduation.    Education: Exercise & Equipment Safety: - Individual verbal instruction and demonstration of equipment use and safety with use of the equipment.   Pulmonary Rehab from 09/21/2020 in [facility] Cardiac and Pulmonary Rehab  Date 09/13/20  Educator AS  Instruction Review Code 1- Verbalizes Understanding      Education: Exercise Physiology & General Exercise Guidelines: - Group verbal and written instruction with models to review the exercise physiology of the cardiovascular system and associated critical values. Provides general exercise guidelines with specific guidelines to those with heart or lung disease.    Cardiac Rehab from 09/02/2018 in [facility] Cardiac and Pulmonary Rehab  Date 05/20/18  Educator AS  Instruction Review Code 4- No Evidence of Learning  [charting in error]      Education: Flexibility, Balance, Mind/Body Relaxation: - Group verbal and visual presentation with interactive activity on the components of exercise prescription. Introduces F.I.T.T principle from ACSM for exercise prescriptions. Reviews F.I.T.T. principles of flexibility and balance exercise training including progression. Also discusses the mind body connection.  Reviews various relaxation techniques to help reduce and manage stress (i.e. Deep breathing, progressive muscle relaxation, and visualization). Balance handout provided to take home. Written material given at graduation.   Cardiac Rehab from 09/02/2018 in [facility] Cardiac and Pulmonary Rehab  Date 07/29/18  Educator AS   Instruction Review Code 1- Verbalizes Understanding  Activity Barriers & Risk Stratification:  Activity Barriers & Cardiac Risk Stratification - 09/12/20 1113      Activity Barriers & Cardiac Risk Stratification   Activity Barriers Shortness of Breath           6 Minute Walk:  6 Minute Walk    Row Name 09/13/20 1415         6 Minute Walk   Phase Initial     Distance 815 feet     Walk Time 5.5 minutes     # of Rest Breaks 3     MPH 1.7     METS 1.8     RPE 16     Perceived Dyspnea  3     VO2 Peak 6.2     Symptoms Yes (comment)     Comments SOB     Resting HR 78 bpm     Resting BP 140/74     Resting Oxygen Saturation  91 %     Exercise Oxygen Saturation  during 6 min walk 84 %     Max Ex. HR 104 bpm     Max Ex. BP 158/64     2 Minute Post BP 138/76       Interval HR   1 Minute HR 85     2 Minute HR 99     3 Minute HR 101     4 Minute HR 104     5 Minute HR 101     6 Minute HR 103     2 Minute Post HR 96     Interval Heart Rate? Yes       Interval Oxygen   Interval Oxygen? Yes     Baseline Oxygen Saturation % 91 %     1 Minute Oxygen Saturation % 85 %     1 Minute Liters of Oxygen 2 L     2 Minute Oxygen Saturation % 84 %     2 Minute Liters of Oxygen 2 L     3 Minute Oxygen Saturation % 84 %     3 Minute Liters of Oxygen 2 L     4 Minute Oxygen Saturation % 85 %     4 Minute Liters of Oxygen 2 L     5 Minute Oxygen Saturation % 85 %     5 Minute Liters of Oxygen 2 L     6 Minute Oxygen Saturation % 86 %     6 Minute Liters of Oxygen 2 L     2 Minute Post Oxygen Saturation % 85 %     2 Minute Post Liters of Oxygen 2 L           Oxygen Initial Assessment:  Oxygen Initial Assessment - 09/12/20 1111      Home Oxygen   Home Oxygen Device Home Concentrator;Portable Concentrator    Sleep Oxygen Prescription Continuous    Liters per minute 2    Home Exercise Oxygen Prescription None    Home Resting Oxygen Prescription Continuous   as needed    Liters per minute 2      Intervention   Short Term Goals To learn and exhibit compliance with exercise, home and travel O2 prescription;To learn and understand importance of monitoring SPO2 with pulse oximeter and demonstrate accurate use of the pulse oximeter.;To learn and understand importance of maintaining oxygen saturations>88%;To learn and demonstrate proper pursed lip breathing techniques or other breathing techniques.;To learn and demonstrate proper use of  respiratory medications    Long  Term Goals Exhibits compliance with exercise, home and travel O2 prescription;Verbalizes importance of monitoring SPO2 with pulse oximeter and return demonstration;Maintenance of O2 saturations>88%;Exhibits proper breathing techniques, such as pursed lip breathing or other method taught during program session;Compliance with respiratory medication;Demonstrates proper use of MDI's           Oxygen Re-Evaluation:  Oxygen Re-Evaluation    Row Name 09/19/20 1127 10/07/20 1113 11/02/20 1107         Program Oxygen Prescription   Program Oxygen Prescription -- Continuous;E-Tanks Continuous;E-Tanks     Liters per minute -- 3 3       Home Oxygen   Home Oxygen Device Home Concentrator;Portable Concentrator Home Concentrator;Portable Concentrator Home Concentrator;Portable Concentrator     Sleep Oxygen Prescription Continuous Continuous Continuous     Liters per minute Home Exercise Oxygen Prescription None Continuous Continuous     Liters per minute -- 2 2     Home Resting Oxygen Prescription Continuous Continuous Continuous     Liters per minute Compliance with Home Oxygen Use -- Yes No  He will use it overnight and will use it when he feels he needs it.       Goals/Expected Outcomes   Short Term Goals To learn and exhibit compliance with exercise, home and travel O2 prescription;To learn and understand importance of monitoring SPO2 with pulse oximeter and demonstrate accurate  use of the pulse oximeter.;To learn and understand importance of maintaining oxygen saturations>88%;To learn and demonstrate proper pursed lip breathing techniques or other breathing techniques.;To learn and demonstrate proper use of respiratory medications To learn and understand importance of maintaining oxygen saturations>88%;To learn and understand importance of monitoring SPO2 with pulse oximeter and demonstrate accurate use of the pulse oximeter. To learn and understand importance of maintaining oxygen saturations>88%;To learn and understand importance of monitoring SPO2 with pulse oximeter and demonstrate accurate use of the pulse oximeter.     Long  Term Goals Exhibits compliance with exercise, home and travel O2 prescription;Verbalizes importance of monitoring SPO2 with pulse oximeter and return demonstration;Maintenance of O2 saturations>88%;Exhibits proper breathing techniques, such as pursed lip breathing or other method taught during program session;Compliance with respiratory medication;Demonstrates proper use of MDI's Maintenance of O2 saturations>88%;Verbalizes importance of monitoring SPO2 with pulse oximeter and return demonstration Maintenance of O2 saturations>88%;Verbalizes importance of monitoring SPO2 with pulse oximeter and return demonstration     Comments Reviewed PLB technique with pt.  Talked about how it works and it's importance in maintaining their exercise saturations. He has a pulse oximeter to check his oxygen saturation at home. Informed and explained why it is important to have one. Reviewed that oxygen saturations should be 88 percent and above. Patient has a pulse oximeter at home to check his oxygen. He reports cheking O2 2-3x/day and uses his nebulizer 2x/day. His O2 is usually 80-90, expressed importance of keeping O2 over 88. Reviewed PLB.     Goals/Expected Outcomes Short: Become more profiecient at using PLB.   Long: Become independent at using PLB. Short: monitor  oxygen at home with exertion. Long: maintain oxygen saturations above 88 percent independently. ST: use O2 as prescribed at home LT: maintain O2 above 88 percent independently.            Oxygen Discharge (Final Oxygen Re-Evaluation):  Oxygen Re-Evaluation - 11/02/20 1107      Program Oxygen Prescription  Program Oxygen Prescription Continuous;E-Tanks    Liters per minute 3      Home Oxygen   Home Oxygen Device Home Concentrator;Portable Concentrator    Sleep Oxygen Prescription Continuous    Liters per minute 2    Home Exercise Oxygen Prescription Continuous    Liters per minute 2    Home Resting Oxygen Prescription Continuous    Liters per minute 2    Compliance with Home Oxygen Use No   He will use it overnight and will use it when he feels he needs it.     Goals/Expected Outcomes   Short Term Goals To learn and understand importance of maintaining oxygen saturations>88%;To learn and understand importance of monitoring SPO2 with pulse oximeter and demonstrate accurate use of the pulse oximeter.    Long  Term Goals Maintenance of O2 saturations>88%;Verbalizes importance of monitoring SPO2 with pulse oximeter and return demonstration    Comments He reports cheking O2 2-3x/day and uses his nebulizer 2x/day. His O2 is usually 80-90, expressed importance of keeping O2 over 88. Reviewed PLB.    Goals/Expected Outcomes ST: use O2 as prescribed at home LT: maintain O2 above 88 percent independently.           Initial Exercise Prescription:  Initial Exercise Prescription - 09/13/20 1400      Date of Initial Exercise RX and Referring Provider   Date 09/13/20    Referring Provider Jayme Cloud      Recumbant Bike   Level 1    RPM 60    Minutes 15    METs 1.8      NuStep   Level 1    SPM 80    Minutes 15    METs 1.8      Arm Ergometer   Level 1    RPM 30    Minutes 15    METs 1.8      Prescription Details   Frequency (times per week) 3    Duration Progress to 30  minutes of continuous aerobic without signs/symptoms of physical distress      Intensity   THRR 40-80% of Max Heartrate 104-131    Ratings of Perceived Exertion 11-13    Perceived Dyspnea 0-4      Resistance Training   Training Prescription Yes    Weight 3 lb    Reps 10-15           Perform Capillary Blood Glucose checks as needed.  Exercise Prescription Changes:  Exercise Prescription Changes    Row Name 09/13/20 1400 09/22/20 0700 10/04/20 0800 10/19/20 0800 10/31/20 1500     Response to Exercise   Blood Pressure (Admit) 140/74 132/68 154/68 160/80 142/70   Blood Pressure (Exercise) 158/64 142/70 176/70 144/80 150/64   Blood Pressure (Exit) 138/76 132/64 144/74 130/80 122/58   Heart Rate (Admit) 78 bpm 83 bpm 79 bpm 80 bpm 82 bpm   Heart Rate (Exercise) 104 bpm 91 bpm 88 bpm 92 bpm 86 bpm   Heart Rate (Exit) 96 bpm 79 bpm 80 bpm 82 bpm 78 bpm   Oxygen Saturation (Admit) 91 % 83 % 91 % 88 % 92 %   Oxygen Saturation (Exercise) 84 % 89 % 92 % 92 % 88 %   Oxygen Saturation (Exit) 85 % 92 % 95 % 92 % 97 %   Rating of Perceived Exertion (Exercise) 16 13 11 13 11    Perceived Dyspnea (Exercise) -- 1 1 0 2   Symptoms SOB -- SOB --  none   Duration -- Continue with 30 min of aerobic exercise without signs/symptoms of physical distress. Continue with 30 min of aerobic exercise without signs/symptoms of physical distress. Continue with 30 min of aerobic exercise without signs/symptoms of physical distress. Continue with 30 min of aerobic exercise without signs/symptoms of physical distress.   Intensity -- THRR unchanged THRR unchanged THRR unchanged THRR unchanged     Progression   Progression -- Continue to progress workloads to maintain intensity without signs/symptoms of physical distress. Continue to progress workloads to maintain intensity without signs/symptoms of physical distress. Continue to progress workloads to maintain intensity without signs/symptoms of physical distress.  Continue to progress workloads to maintain intensity without signs/symptoms of physical distress.   Average METs -- 2.2 2.3 2.5 2.8     Resistance Training   Training Prescription -- Yes Yes Yes Yes   Weight -- 5 lb 5 lb 5 lb 5 lb   Reps -- 10-15 10-15 10-15 10-15     Interval Training   Interval Training -- -- No No No     Oxygen   Oxygen -- -- Continuous Continuous Continuous   Liters -- -- NuStep   Level -- SPM -- 80 -- 80 --   Minutes -- METs -- 2.2 2.3 2.5 2.8   Row Name 11/14/20 1600             Response to Exercise   Blood Pressure (Admit) 138/70       Blood Pressure (Exercise) 142/76       Blood Pressure (Exit) 128/58       Heart Rate (Admit) 83 bpm       Heart Rate (Exercise) 89 bpm       Heart Rate (Exit) 87 bpm       Oxygen Saturation (Admit) 94 %       Oxygen Saturation (Exercise) 93 %       Oxygen Saturation (Exit) 92 %       Rating of Perceived Exertion (Exercise) 11       Symptoms none       Duration Continue with 30 min of aerobic exercise without signs/symptoms of physical distress.       Intensity THRR unchanged         Progression   Progression Continue to progress workloads to maintain intensity without signs/symptoms of physical distress.       Average METs 2.15         Resistance Training   Training Prescription Yes       Weight 5 lb       Reps 10-15         Interval Training   Interval Training No         Oxygen   Oxygen Continuous       Liters 3         NuStep   Level 4       SPM 80       Minutes 15       METs 2.15              Exercise Comments:   Exercise Goals and Review:  Exercise Goals    Row Name 09/13/20 1421             Exercise Goals   Increase Physical Activity Yes       Intervention Provide advice, education,  support and counseling about physical activity/exercise needs.;Develop an individualized exercise prescription for aerobic and resistive training based on initial  evaluation findings, risk stratification, comorbidities and participant's personal goals.       Expected Outcomes Short Term: Attend rehab on a regular basis to increase amount of physical activity.;Long Term: Add in home exercise to make exercise part of routine and to increase amount of physical activity.;Long Term: Exercising regularly at least 3-5 days a week.       Increase Strength and Stamina Yes       Intervention Provide advice, education, support and counseling about physical activity/exercise needs.;Develop an individualized exercise prescription for aerobic and resistive training based on initial evaluation findings, risk stratification, comorbidities and participant's personal goals.       Expected Outcomes Short Term: Increase workloads from initial exercise prescription for resistance, speed, and METs.;Short Term: Perform resistance training exercises routinely during rehab and add in resistance training at home;Long Term: Improve cardiorespiratory fitness, muscular endurance and strength as measured by increased METs and functional capacity ( )       Able to understand and use rate of perceived exertion (RPE) scale Yes       Intervention Provide education and explanation on how to use RPE scale       Expected Outcomes Short Term: Able to use RPE daily in rehab to express subjective intensity level;Long Term:  Able to use RPE to guide intensity level when exercising independently       Able to understand and use Dyspnea scale Yes       Intervention Provide education and explanation on how to use Dyspnea scale       Expected Outcomes Short Term: Able to use Dyspnea scale daily in rehab to express subjective sense of shortness of breath during exertion;Long Term: Able to use Dyspnea scale to guide intensity level when exercising independently       Knowledge and understanding of Target Heart Rate Range (THRR) Yes       Intervention Provide education and explanation of THRR including how  the numbers were predicted and where they are located for reference       Expected Outcomes Short Term: Able to state/look up THRR;Short Term: Able to use daily as guideline for intensity in rehab;Long Term: Able to use THRR to govern intensity when exercising independently       Able to check pulse independently Yes       Intervention Provide education and demonstration on how to check pulse in carotid and radial arteries.;Review the importance of being able to check your own pulse for safety during independent exercise       Expected Outcomes Short Term: Able to explain why pulse checking is important during independent exercise;Long Term: Able to check pulse independently and accurately       Understanding of Exercise Prescription Yes       Intervention Provide education, explanation, and written materials on patient's individual exercise prescription       Expected Outcomes Short Term: Able to explain program exercise prescription;Long Term: Able to explain home exercise prescription to exercise independently              Exercise Goals Re-Evaluation :  Exercise Goals Re-Evaluation    Row Name 09/19/20 1126 09/22/20 0734 10/04/20 0854 10/07/20 1123 10/19/20 0826     Exercise Goal Re-Evaluation   Exercise Goals Review Increase Physical Activity;Able to understand and use rate of perceived exertion (RPE) scale;Knowledge and understanding of Target Heart  Rate Range (THRR);Understanding of Exercise Prescription;Increase Strength and Stamina;Able to understand and use Dyspnea scale;Able to check pulse independently Increase Physical Activity;Increase Strength and Stamina Increase Physical Activity;Increase Strength and Stamina;Understanding of Exercise Prescription Understanding of Exercise Prescription Increase Physical Activity;Increase Strength and Stamina   Comments Reviewed RPE and dyspnea scales, THR and program prescription with pt today.  Pt voiced understanding and was given a copy of  goals to take home. Bryson is off to a good start in [facility].  Staff will monitor progress. Lenin has requested to just use the T4 NuStep as he does not like the other machines, they feel uncomfortable. He does alternate between using both arms and legs, just legs, and just arms.  He also has difficulty with his mask,but does better with the exercise mask.  We will continue to montior his progress on the NuStep. Raph states that he is interested in the [place] once he is done with the program. He is going to look into his insurance to see if he has [employer]. Informed him that the gym has oxygen tanks and would be good to continue his exercise. Dirk has been attending twice per week most weeks.  Staff will review home exercise so he can add 1-2 days at home for better progress.  He has moved up to level 6 on NS.   Expected Outcomes Short: Use RPE daily to regulate intensity. Long: Follow program prescription in THR. Short:  attend consistently Long:  improve stamina Short: Maintain spm consistently Long; Conitnue to improve stamina. Short: check with insurance if he has [employer]. Long: maintain an exercise regimine post [facility]. Short: review home exercise Long: exercise 3-5 days per week consistently   Row Name 10/31/20 1517 11/02/20 1141 11/14/20 1642         Exercise Goal Re-Evaluation   Exercise Goals Review Increase Physical Activity;Increase Strength and Stamina;Understanding of Exercise Prescription Increase Physical Activity;Increase Strength and Stamina;Understanding of Exercise Prescription Increase Physical Activity;Increase Strength and Stamina;Understanding of Exercise Prescription     Comments Only attended once since last review. Manjot continues to just use the same station as it does not hurt him at all.  We will continue to montior his progress. Reviewed home exercise with pt today.  Pt plans to walk/bike, may go to [facility] for exercise.  Reviewed THR, pulse, RPE, sign and  symptoms, pulse oximetery and when to call 911 or MD.  Also discussed weather considerations and indoor options.  Pt voiced understanding. Fleming lowered level on T4 today.  Staff will continue to monitor progress.     Expected Outcomes Short: Return to regular attendance  Long: Continue to exercise regularly. -- Short: get back up to previous levels Long: maintain consistent exercise            Discharge Exercise Prescription (Final Exercise Prescription Changes):  Exercise Prescription Changes - 11/14/20 1600      Response to Exercise   Blood Pressure (Admit) 138/70    Blood Pressure (Exercise) 142/76    Blood Pressure (Exit) 128/58    Heart Rate (Admit) 83 bpm    Heart Rate (Exercise) 89 bpm    Heart Rate (Exit) 87 bpm    Oxygen Saturation (Admit) 94 %    Oxygen Saturation (Exercise) 93 %    Oxygen Saturation (Exit) 92 %    Rating of Perceived Exertion (Exercise) 11    Symptoms none    Duration Continue with 30 min of aerobic exercise without signs/symptoms of physical distress.  Intensity THRR unchanged      Progression   Progression Continue to progress workloads to maintain intensity without signs/symptoms of physical distress.    Average METs 2.15      Resistance Training   Training Prescription Yes    Weight 5 lb    Reps 10-15      Interval Training   Interval Training No      Oxygen   Oxygen Continuous    Liters 3      NuStep   Level 4    SPM 80    Minutes 15    METs 2.15           Nutrition:  Target Goals: Understanding of nutrition guidelines, daily intake of sodium 1500mg , cholesterol 200mg , calories 30% from fat and 7% or less from saturated fats, daily to have 5 or more servings of fruits and vegetables.  Education: All About Nutrition: -Group instruction provided by verbal, written material, interactive activities, discussions, models, and posters to present general guidelines for heart healthy nutrition including fat, fiber, MyPlate, the role  of sodium in heart healthy nutrition, utilization of the nutrition label, and utilization of this knowledge for meal planning. Follow up email sent as well. Written material given at graduation.   Cardiac Rehab from 09/02/2018 in [facility] Cardiac and Pulmonary Rehab  Date 08/26/18  Educator LB  Instruction Review Code 1- Verbalizes Understanding      Biometrics:  Pre Biometrics - 09/13/20 1422      Pre Biometrics   Height 5\' 5"  (1.651 m)    Weight 182 lb 14.4 oz (83 kg)    BMI (Calculated) 30.44    Single Leg Stand 10 seconds            Nutrition Therapy Plan and Nutrition Goals:  Nutrition Therapy & Goals - 11/02/20 1124      Personal Nutrition Goals   Nutrition Goal No goals at this time.    Comments Got two more books on DM at the book store this weekend. B; cereal - cherrios, rice krispies, and oatmeal, eggs and bacon, tail of ham - egss and cheese on a roll. L: sometimes will skip D: loves pasta. He eats salads, chicken, steak, escarole and beans. He doesn't care for poatoes aside from hashbrowns. S: cookies - sleep time tea - he will also have protein drinks with his coffee in the afternoon. He reports using stevia and limiting simple sugars. He reports being through education before and not wanting to make any changes at this time.           Nutrition Assessments:  Nutrition Assessments - 09/13/20 1425      MEDFICTS Scores   Pre Score 65          MEDIFICTS Score Key:  ?70 Need to make dietary changes   40-70 Heart Healthy Diet  ? 40 Therapeutic Level Cholesterol Diet   Picture Your Plate Scores:  09/15/20 Unhealthy dietary pattern with much room for improvement.  41-50 Dietary pattern unlikely to meet recommendations for good health and room for improvement.  51-60 More healthful dietary pattern, with some room for improvement.   >60 Healthy dietary pattern, although there may be some specific behaviors that could be improved.   Nutrition Goals  Re-Evaluation:   Nutrition Goals Discharge (Final Nutrition Goals Re-Evaluation):   Psychosocial: Target Goals: Acknowledge presence or absence of significant depression and/or stress, maximize coping skills, provide positive support system. Participant is able to verbalize types and ability  to use techniques and skills needed for reducing stress and depression.   Education: Stress, Anxiety, and Depression - Group verbal and visual presentation to define topics covered.  Reviews how body is impacted by stress, anxiety, and depression.  Also discusses healthy ways to reduce stress and to treat/manage anxiety and depression.  Written material given at graduation.   Pulmonary Rehab from 09/21/2020 in [facility] Cardiac and Pulmonary Rehab  Date 09/21/20  Educator [facility]  Instruction Review Code 1- Verbalizes Understanding      Education: Sleep Hygiene -Provides group verbal and written instruction about how sleep can affect your health.  Define sleep hygiene, discuss sleep cycles and impact of sleep habits. Review good sleep hygiene tips.    Cardiac Rehab from 09/02/2018 in [facility] Cardiac and Pulmonary Rehab  Date 08/19/18  Educator [facility]  Instruction Review Code 1- Verbalizes Understanding  [Arrived late to class]      Initial Review & Psychosocial Screening:  Initial Psych Review & Screening - 09/12/20 1116      Initial Review   Current issues with Current Anxiety/Panic      Family Dynamics   Good Support System? Yes   Johnnye Sima, 6 years, family     Barriers   Psychosocial barriers to participate in program There are no identifiable barriers or psychosocial needs.;The patient should benefit from training in stress management and relaxation.;Psychosocial barriers identified (see note)      Screening Interventions   Interventions Encouraged to exercise    Expected Outcomes Short Term goal: Utilizing psychosocial counselor, staff and physician to assist with identification of specific  Stressors or current issues interfering with healing process. Setting desired goal for each stressor or current issue identified.;Long Term Goal: Stressors or current issues are controlled or eliminated.;Short Term goal: Identification and review with participant of any Quality of Life or Depression concerns found by scoring the questionnaire.;Long Term goal: The participant improves quality of Life and PHQ9 Scores as seen by post scores and/or verbalization of changes           Quality of Life Scores:  Scores of 19 and below usually indicate a poorer quality of life in these areas.  A difference of  2-3 points is a clinically meaningful difference.  A difference of 2-3 points in the total score of the Quality of Life Index has been associated with significant improvement in overall quality of life, self-image, physical symptoms, and general health in studies assessing change in quality of life.  PHQ-9: Recent Review Flowsheet Data    Depression screen [facility] 2/9 09/13/2020 08/26/2018 05/01/2018   Decreased Interest 0 0 0   Down, Depressed, Hopeless 0 0 0   PHQ - 2 Score 0 0 0   Altered sleeping 0 0 0   Tired, decreased energy 1 1 0   Change in appetite 0 - 0   Feeling bad or failure about yourself  0 0 0   Trouble concentrating 0 0 0   Moving slowly or fidgety/restless 0 0 0   Suicidal thoughts 0 0 0   PHQ-9 Score 1 1 0   Difficult doing work/chores Not difficult at all Not difficult at all Not difficult at all     Interpretation of Total Score  Total Score Depression Severity:  1-4 = Minimal depression, 5-9 = Mild depression, 10-14 = Moderate depression, 15-19 = Moderately severe depression, 20-27 = Severe depression   Psychosocial Evaluation and Intervention:  Psychosocial Evaluation - 09/12/20 1136  Psychosocial Evaluation & Interventions   Comments Campbell doe not have any barriers to attending the program.  He remembers attending Cardiac Rehab several years ago and is looking  forward to starting Pulmonary Rehab. He does get anxiety at time when his breathing is hard. He has learned to slow down, and reset to help get his breathing back to his normal. He lives with his Julio Sicks and their 2 dogs. He has a great support system with [name] and his family. He continues to be active, traveling and getting out his bike. He has purchased his own portable oxygen concentrator for at home and traveling. He should do great in the program.    Expected Outcomes STG: Serge will attend all scheduled sessions to gain the most from the program. LTG: [place] will see improvement in his ADL activity and be able to maintain this improvement after discharge.    Continue Psychosocial Services  Follow up required by staff           Psychosocial Re-Evaluation:  Psychosocial Re-Evaluation    Row Name 10/07/20 1118 11/02/20 1111           Psychosocial Re-Evaluation   Current issues with Current Stress Concerns;Current Anxiety/Panic Current Anxiety/Panic      Comments Brayon gets anxious when he gets short of breath. His breathing is the only issue that gets him down. He states that his mind is sharp and is willing to get healthier to breath easier. He reports he is on xanax for anxiety for 7 years and reports it's helping. He reports getting overwhelmed sometimes with anxiety when things don't go right, gotten woirse since his wife passed 7 years ago. He will ride his motorcycle and watches TV to reduce stress and anxiety, he has techniques where he reduces his panic - ice cold water. Support system in girlfriend who he lives with and chicldren who are in [place].      Expected Outcomes Short: continue [facility] to improve shortness of breath. Long: maintain exercise to keep stress at a minimum. Short: continue [facility] to improve shortness of breath. Long: maintain exercise to keep stress at a minimum, utilize support system, keep using relaxing activities and techniques.      Interventions  Encouraged to attend Pulmonary Rehabilitation for the exercise Encouraged to attend Pulmonary Rehabilitation for the exercise      Continue Psychosocial Services  Follow up required by staff Follow up required by staff        Initial Review   Source of Stress Concerns -- Chronic Illness             Psychosocial Discharge (Final Psychosocial Re-Evaluation):  Psychosocial Re-Evaluation - 11/02/20 1111      Psychosocial Re-Evaluation   Current issues with Current Anxiety/Panic    Comments He reports he is on xanax for anxiety for 7 years and reports it's helping. He reports getting overwhelmed sometimes with anxiety when things don't go right, gotten woirse since his wife passed 7 years ago. He will ride his motorcycle and watches TV to reduce stress and anxiety, he has techniques where he reduces his panic - ice cold water. Support system in girlfriend who he lives with and chicldren who are in [place].    Expected Outcomes Short: continue [facility] to improve shortness of breath. Long: maintain exercise to keep stress at a minimum, utilize support system, keep using relaxing activities and techniques.    Interventions Encouraged to attend Pulmonary Rehabilitation for the exercise    Continue  Psychosocial Services  Follow up required by staff      Initial Review   Source of Stress Concerns Chronic Illness           Education: Education Goals: Education classes will be provided on a weekly basis, covering required topics. Participant will state understanding/return demonstration of topics presented.  Learning Barriers/Preferences:  Learning Barriers/Preferences - 09/12/20 1121      Learning Barriers/Preferences   Learning Barriers None    Learning Preferences None           General Pulmonary Education Topics:  Infection Prevention: - Provides verbal and written material to individual with discussion of infection control including proper hand washing and proper equipment  cleaning during exercise session.   Pulmonary Rehab from 09/21/2020 in [name] Cardiac and Pulmonary Rehab  Date 09/13/20  Educator AS  Instruction Review Code 1- Verbalizes Understanding      Falls Prevention: - Provides verbal and written material to individual with discussion of falls prevention and safety.   Pulmonary Rehab from 09/21/2020 in [facility] Cardiac and Pulmonary Rehab  Date 09/13/20  Educator AS  Instruction Review Code 1- Verbalizes Understanding      Chronic Lung Disease Review: - Group verbal instruction with posters, models, PowerPoint presentations and videos,  to review new updates, new respiratory medications, new advancements in procedures and treatments. Providing information on websites and "800" numbers for continued self-education. Includes information about supplement oxygen, available portable oxygen systems, continuous and intermittent flow rates, oxygen safety, concentrators, and Medicare reimbursement for oxygen. Explanation of Pulmonary Drugs, including class, frequency, complications, importance of spacers, rinsing mouth after steroid MDI's, and proper cleaning methods for nebulizers. Review of basic lung anatomy and physiology related to function, structure, and complications of lung disease. Review of risk factors. Discussion about methods for diagnosing sleep apnea and types of masks and machines for OSA. Includes a review of the use of types of environmental controls: home humidity, furnaces, filters, dust mite/pet prevention, HEPA vacuums. Discussion about weather changes, air quality and the benefits of nasal washing. Instruction on Warning signs, infection symptoms, calling MD promptly, preventive modes, and value of vaccinations. Review of effective airway clearance, coughing and/or vibration techniques. Emphasizing that all should Create an Action Plan. Written material given at graduation.   AED/CPR: - Group verbal and written instruction with the use of  models to demonstrate the basic use of the AED with the basic ABC's of resuscitation.   Cardiac Rehab from 09/02/2018 in [facility] Cardiac and Pulmonary Rehab  Date 09/02/18  Educator [place]  Instruction Review Code 1- Verbalizes Understanding       Anatomy and Cardiac Procedures: - Group verbal and visual presentation and models provide information about basic cardiac anatomy and function. Reviews the testing methods done to diagnose heart disease and the outcomes of the test results. Describes the treatment choices: Medical Management, Angioplasty, or Coronary Bypass Surgery for treating various heart conditions including Myocardial Infarction, Angina, Valve Disease, and Cardiac Arrhythmias.  Written material given at graduation.   Cardiac Rehab from 09/02/2018 in [facility] Cardiac and Pulmonary Rehab  Date 08/07/18  Educator CE  Instruction Review Code 1- Verbalizes Understanding      Medication Safety: - Group verbal and visual instruction to review commonly prescribed medications for heart and lung disease. Reviews the medication, class of the drug, and side effects. Includes the steps to properly store meds and maintain the prescription regimen.  Written material given at graduation.   Cardiac Rehab from 09/02/2018 in [facility]  Cardiac and Pulmonary Rehab  Date 08/12/18  Educator SB  Instruction Review Code 1- Verbalizes Understanding      Other: -Provides group and verbal instruction on various topics (see comments)   Knowledge Questionnaire Score:  Knowledge Questionnaire Score - 09/13/20 1424      Knowledge Questionnaire Score   Pre Score 16/18            Core Components/Risk Factors/Patient Goals at Admission:  Personal Goals and Risk Factors at Admission - 09/13/20 1426      Core Components/Risk Factors/Patient Goals on Admission    Weight Management Yes;Weight Loss    Intervention Weight Management: Develop a combined nutrition and exercise program designed to reach desired  caloric intake, while maintaining appropriate intake of nutrient and fiber, sodium and fats, and appropriate energy expenditure required for the weight goal.    Admit Weight 182 lb 14.4 oz (83 kg)    Goal Weight: Short Term 180 lb (81.6 kg)    Goal Weight: Long Term 175 lb (79.4 kg)    Expected Outcomes Short Term: Continue to assess and modify interventions until short term weight is achieved;Long Term: Adherence to nutrition and physical activity/exercise program aimed toward attainment of established weight goal;Weight Loss: Understanding of general recommendations for a balanced deficit meal plan, which promotes 1-2 lb weight loss per week and includes a negative energy balance of [phone] kcal/d    Diabetes Yes    Intervention Provide education about signs/symptoms and action to take for hypo/hyperglycemia.;Provide education about proper nutrition, including hydration, and aerobic/resistive exercise prescription along with prescribed medications to achieve blood glucose in normal ranges: Fasting glucose 65-99 mg/dL    Expected Outcomes Short Term: Participant verbalizes understanding of the signs/symptoms and immediate care of hyper/hypoglycemia, proper foot care and importance of medication, aerobic/resistive exercise and nutrition plan for blood glucose control.;Long Term: Attainment of HbA1C < 7%.    Hypertension Yes    Intervention Provide education on lifestyle modifcations including regular physical activity/exercise, weight management, moderate sodium restriction and increased consumption of fresh fruit, vegetables, and low fat dairy, alcohol moderation, and smoking cessation.;Monitor prescription use compliance.    Expected Outcomes Short Term: Continued assessment and intervention until BP is < 140/73mm HG in hypertensive participants. < 130/71mm HG in hypertensive participants with diabetes, heart failure or chronic kidney disease.;Long Term: Maintenance of blood pressure at goal levels.     Lipids Yes    Intervention Provide education and support for participant on nutrition & aerobic/resistive exercise along with prescribed medications to achieve LDL 70mg , HDL >40mg .    Expected Outcomes Short Term: Participant states understanding of desired cholesterol values and is compliant with medications prescribed. Participant is following exercise prescription and nutrition guidelines.;Long Term: Cholesterol controlled with medications as prescribed, with individualized exercise RX and with personalized nutrition plan. Value goals: LDL < 70mg , HDL > 40 mg.    Personal Goal Other Yes           Education:Diabetes - Individual verbal and written instruction to review signs/symptoms of diabetes, desired ranges of glucose level fasting, after meals and with exercise. Acknowledge that pre and post exercise glucose checks will be done for 3 sessions at entry of program.   Cardiac Rehab from 09/02/2018 in [facility] Cardiac and Pulmonary Rehab  Date 05/01/18  Educator [facility]  Instruction Review Code 1- Verbalizes Understanding      Know Your Numbers and Heart Failure: - Group verbal and visual instruction to discuss disease risk factors for cardiac and pulmonary  disease and treatment options.  Reviews associated critical values for Overweight/Obesity, Hypertension, Cholesterol, and Diabetes.  Discusses basics of heart failure: signs/symptoms and treatments.  Introduces Heart Failure Zone chart for action plan for heart failure.  Written material given at graduation.   Cardiac Rehab from 09/02/2018 in [facility] Cardiac and Pulmonary Rehab  Date 08/07/18  Educator CE  Instruction Review Code 1- Verbalizes Understanding      Core Components/Risk Factors/Patient Goals Review:   Goals and Risk Factor Review    Row Name 10/07/20 1116 11/02/20 1120           Core Components/Risk Factors/Patient Goals Review   Personal Goals Review Improve shortness of breath with ADL's Improve shortness of breath with  ADL's;Diabetes      Review If he is doing chores at home and if things are too strennuous he get anxious and short of breath.Spoke to patient about their shortness of breath and what they can do to improve. Patient has been informed of breathing techniques when starting the program. Patient is informed to tell staff if they have had any med changes and that certain meds they are taking or not taking can be causing shortness of breath. This weekend BG got up to 400 after syrup and pineapple juice - took 2 metformin and it went down to 40. Feeling better now. BG in am usually 125. Educated on hypo and hyperglycemia - how to identify and how to normalize BG. He reports doing better with ADLs at home - will get worse if he moves too quickly or carries things that are too heavy.      Expected Outcomes Short: Attend [facility] regularly to improve shortness of breath with ADL's. Long: maintain independence with ADL's Short: Attend [facility] regularly to improve shortness of breath with ADL's, use O2 as prescribed, continue with management of BG (limiting simple sugars) Long: maintain independence with ADL's             Core Components/Risk Factors/Patient Goals at Discharge (Final Review):   Goals and Risk Factor Review - 11/02/20 1120      Core Components/Risk Factors/Patient Goals Review   Personal Goals Review Improve shortness of breath with ADL's;Diabetes    Review This weekend BG got up to 400 after syrup and pineapple juice - took 2 metformin and it went down to 40. Feeling better now. BG in am usually 125. Educated on hypo and hyperglycemia - how to identify and how to normalize BG. He reports doing better with ADLs at home - will get worse if he moves too quickly or carries things that are too heavy.    Expected Outcomes Short: Attend [facility] regularly to improve shortness of breath with ADL's, use O2 as prescribed, continue with management of BG (limiting simple sugars) Long: maintain independence  with ADL's           ITP Comments:  ITP Comments    Row Name 09/12/20 1129 09/13/20 1438 09/19/20 1125 09/21/20 0618 10/19/20 0717   ITP Comments Virtual orientation call completed today. he has an appointment on Date: [id number] for EP eval and gym Orientation.  Documentation of diagnosis can be found in [facility] Date: 04/14/2020. Completed and gym orientation. Initial ITP created and sent for review to Dr. Bethann Punches, Medical Director. First full day of exercise!  Patient was oriented to gym and equipment including functions, settings, policies, and procedures.  Patient's individual exercise prescription and treatment plan were reviewed.  All starting workloads were established based on  the results of the 6 minute walk test done at initial orientation visit.  The plan for exercise progression was also introduced and progression will be customized based on patient's performance and goals. 30 Day review completed. Medical Director ITP review done, changes made as directed, and signed approval by Medical Director. 30 Day review completed. Medical Director ITP review done, changes made as directed, and signed approval by Medical Director.   Row Name 11/16/20 1043           ITP Comments 30 Day review completed. Medical Director ITP review done, changes made as directed, and signed approval by Medical Director.              Comments:

## 2020-11-23 ENCOUNTER — Other Ambulatory Visit: Payer: Self-pay

## 2020-11-23 DIAGNOSIS — J449 Chronic obstructive pulmonary disease, unspecified: Secondary | ICD-10-CM | POA: Diagnosis not present

## 2020-11-23 NOTE — Progress Notes (Signed)
Daily Session Note  Patient Details  Name: Harold Parker MRN: [id number] Date of Birth: 03/11/1946 Referring Provider:     Pulmonary Rehab from 09/13/2020 in [facility]  Referring Provider Gonzalez      Encounter Date: 11/23/2020  Check In:  Session Check In - 11/23/20 1123      Check-In   Supervising physician immediately available to respond to emergencies See telemetry face sheet for immediately available ER MD    Location [facility]    Staff Present Kelly Bollinger, MPA, RN;Amanda Sommer, BA, ACSM CEP, Exercise Physiologist;Joseph Hood RCP,RRT,BSRT    Virtual Visit No    Medication changes reported     No    Fall or balance concerns reported    No    Warm-up and Cool-down Performed on first and last piece of equipment    Resistance Training Performed Yes    VAD Patient? No    PAD/SET Patient? No      Pain Assessment   Currently in Pain? No/denies              Social History   Tobacco Use  Smoking Status Former Smoker  . Packs/day: 1.00  . Years: 40.00  . Pack years: 40.00  . Types: Cigarettes  . Quit date: 02/02/2018  . Years since quitting: 2.8  Smokeless Tobacco Never Used  Tobacco Comment   Quit 01/2018 - on 05/01/2018 talked to Jayan about Relaspe concerns. He ststaes that he has no taste for tobacco since he quit in Feb.    Goals Met:  Independence with exercise equipment Exercise tolerated well No report of cardiac concerns or symptoms Strength training completed today  Goals Unmet:  Not Applicable  Comments: Pt able to follow exercise prescription today without complaint.  Will continue to monitor for progression.    Dr. Mark Miller is Medical Director for [facility] and [facility]. 

## 2020-11-30 ENCOUNTER — Other Ambulatory Visit: Payer: Self-pay

## 2020-11-30 ENCOUNTER — Encounter: Payer: Medicare Other | Admitting: *Deleted

## 2020-11-30 DIAGNOSIS — J449 Chronic obstructive pulmonary disease, unspecified: Secondary | ICD-10-CM

## 2020-11-30 NOTE — Progress Notes (Signed)
Daily Session Note  Patient Details  Name: Harold Parker MRN: [id number] Date of Birth: 07/16/1946 Referring Provider:   Flowsheet Row Pulmonary Rehab from 09/13/2020 in [facility]  Referring Provider Patsey Berthold      Encounter Date: 11/30/2020  Check In:  Session Check In - 11/30/20 1148      Check-In   Supervising physician immediately available to respond to emergencies See telemetry face sheet for immediately available ER MD    Location [facility]    Staff Present Heath Lark, RN, BSN, CCRP;Joseph Hood RCP,RRT,BSRT;Melissa Waterflow RDN, Rowe Pavy, BA, ACSM CEP, Exercise Physiologist    Virtual Visit No    Medication changes reported     No    Fall or balance concerns reported    No    Warm-up and Cool-down Performed on first and last piece of equipment    Resistance Training Performed Yes    VAD Patient? No    PAD/SET Patient? No      Pain Assessment   Currently in Pain? No/denies              Social History   Tobacco Use  Smoking Status Former Smoker  . Packs/day: 1.00  . Years: 40.00  . Pack years: 40.00  . Types: Cigarettes  . Quit date: 02/02/2018  . Years since quitting: 2.8  Smokeless Tobacco Never Used  Tobacco Comment   Quit 01/2018 - on 05/01/2018 talked to Harold Parker about Relaspe concerns. He ststaes that he has no taste for tobacco since he quit in Feb.    Goals Met:  Proper associated with RPD/PD & O2 Sat Independence with exercise equipment Exercise tolerated well No report of cardiac concerns or symptoms  Goals Unmet:  Not Applicable  Comments: Pt able to follow exercise prescription today without complaint.  Will continue to monitor for progression.    Dr. Emily Parker is Medical Director for [place] and [facility].

## 2020-12-05 ENCOUNTER — Other Ambulatory Visit: Payer: Self-pay

## 2020-12-05 ENCOUNTER — Encounter: Payer: Medicare Other | Admitting: *Deleted

## 2020-12-05 DIAGNOSIS — J449 Chronic obstructive pulmonary disease, unspecified: Secondary | ICD-10-CM

## 2020-12-05 NOTE — Progress Notes (Signed)
Daily Session Note  Patient Details  Name: Harold Parker MRN: [id number] Date of Birth: September 15, 1946 Referring Provider:   Flowsheet Row Pulmonary Rehab from 09/13/2020 in [facility]  Referring Provider Harold Parker      Encounter Date: 12/05/2020  Check In:  Session Check In - 12/05/20 1121      Check-In   Supervising physician immediately available to respond to emergencies See telemetry face sheet for immediately available ER MD    Location [facility]    Staff Present Harold Papa, RN BSN;Harold Parker [address] [place], [place], ACSM CEP, Exercise Physiologist;Harold Parker Eliezer Bottom, MS Exercise Physiologist    Virtual Visit No    Medication changes reported     No    Fall or balance concerns reported    No    Warm-up and Cool-down Performed on first and last piece of equipment    Resistance Training Performed Yes    VAD Patient? No    PAD/SET Patient? No      Pain Assessment   Currently in Pain? No/denies              Social History   Tobacco Use  Smoking Status Former Smoker  . Packs/day: 1.00  . Years: 40.00  . Pack years: 40.00  . Types: Cigarettes  . Quit date: 02/02/2018  . Years since quitting: 2.8  Smokeless Tobacco Never Used  Tobacco Comment   Quit 01/2018 - on 05/01/2018 talked to Harold Parker about Relaspe concerns. He ststaes that he has no taste for tobacco since he quit in Feb.    Goals Met:  Independence with exercise equipment Exercise tolerated well No report of cardiac concerns or symptoms Strength training completed today  Goals Unmet:  Not Applicable  Comments: Pt able to follow exercise prescription today without complaint.  Will continue to monitor for progression.    Dr. Emily Parker is Medical Director for [place] and [facility].

## 2020-12-06 ENCOUNTER — Other Ambulatory Visit: Payer: Self-pay | Admitting: Pulmonary Disease

## 2020-12-12 ENCOUNTER — Encounter: Payer: Medicare Other | Admitting: *Deleted

## 2020-12-12 ENCOUNTER — Other Ambulatory Visit: Payer: Self-pay

## 2020-12-12 DIAGNOSIS — J449 Chronic obstructive pulmonary disease, unspecified: Secondary | ICD-10-CM | POA: Diagnosis not present

## 2020-12-12 NOTE — Progress Notes (Signed)
Daily Session Note  Patient Details  Name: Kelden Lavallee MRN: [id number] Date of Birth: 1946/11/27 Referring Provider:   Flowsheet Row Pulmonary Rehab from 09/13/2020 in [facility] Re[facility]  Referring Provider Patsey Berthold      Encounter Date: 12/12/2020  Check In:  Session Check In - 12/12/20 1113      Check-In   Supervising physician immediately available to respond to emergencies See telemetry face sheet for immediately available ER MD    Location [facility]    Staff Present Renita Papa, RN BSN;Joseph Foy [place], [place], ACSM CEP, Exercise Physiologist    Virtual Visit No    Medication changes reported     No    Fall or balance concerns reported    No    Warm-up and Cool-down Performed on first and last piece of equipment    Resistance Training Performed Yes    VAD Patient? No    PAD/SET Patient? No      Pain Assessment   Currently in Pain? No/denies              Social History   Tobacco Use  Smoking Status Former Smoker  . Packs/day: 1.00  . Years: 40.00  . Pack years: 40.00  . Types: Cigarettes  . Quit date: 02/02/2018  . Years since quitting: 2.8  Smokeless Tobacco Never Used  Tobacco Comment   Quit 01/2018 - on 05/01/2018 talked to Chetan about Relaspe concerns. He ststaes that he has no taste for tobacco since he quit in Feb.    Goals Met:  Independence with exercise equipment Exercise tolerated well No report of cardiac concerns or symptoms Strength training completed today  Goals Unmet:  Not Applicable  Comments: Pt able to follow exercise prescription today without complaint.  Will continue to monitor for progression.    Dr. Emily Filbert is Medical Director for [place] and [facility].

## 2020-12-14 ENCOUNTER — Other Ambulatory Visit: Payer: Self-pay

## 2020-12-14 ENCOUNTER — Encounter: Payer: Self-pay | Admitting: *Deleted

## 2020-12-14 ENCOUNTER — Encounter: Payer: Medicare Other | Admitting: *Deleted

## 2020-12-14 DIAGNOSIS — J449 Chronic obstructive pulmonary disease, unspecified: Secondary | ICD-10-CM

## 2020-12-14 NOTE — Progress Notes (Signed)
Daily Session Note  Patient Details  Name: Harold Parker MRN: [id number] Date of Birth: [birth year] Referring Provider:   Flowsheet Row Pulmonary Rehab from 09/13/2020 in [facility]  Referring Provider Patsey Berthold      Encounter Date: 12/14/2020  Check In:  Session Check In - 12/14/20 1109      Check-In   Supervising physician immediately available to respond to emergencies See telemetry face sheet for immediately available ER MD    Location [facility]    Staff Present Renita Papa, RN BSN;Melissa Caiola RDN, Rowe Pavy, BA, ACSM CEP, Exercise Physiologist;Kara Eliezer Bottom, MS Exercise Physiologist    Virtual Visit No    Medication changes reported     No    Fall or balance concerns reported    No    Warm-up and Cool-down Performed on first and last piece of equipment    Resistance Training Performed Yes    VAD Patient? No    PAD/SET Patient? No      Pain Assessment   Currently in Pain? No/denies              Social History   Tobacco Use  Smoking Status Former Smoker  . Packs/day: 1.00  . Years: 40.00  . Pack years: 40.00  . Types: Cigarettes  . Quit date: 02/02/2018  . Years since quitting: 2.8  Smokeless Tobacco Never Used  Tobacco Comment   Quit 01/2018 - on 05/01/2018 talked to Kiondre about Relaspe concerns. He ststaes that he has no taste for tobacco since he quit in Feb.    Goals Met:  Independence with exercise equipment Exercise tolerated well No report of cardiac concerns or symptoms Strength training completed today  Goals Unmet:  Not Applicable  Comments: Pt able to follow exercise prescription today without complaint.  Will continue to monitor for progression.    Dr. Emily Filbert is Medical Director for [place] and [facility].

## 2020-12-14 NOTE — Progress Notes (Signed)
Pulmonary Individual Treatment Plan  Patient Details  Name: Harold Parker MRN: [id number] Date of Birth: 11-Aug-1946 Referring Provider:   Flowsheet Row Pulmonary Rehab from 09/13/2020 in [facility] Cardiac and Pulmonary Rehab  Referring Provider Jayme Cloud      Initial Encounter Date:  Flowsheet Row Pulmonary Rehab from 09/13/2020 in [facility] Cardiac and Pulmonary Rehab  Date 09/13/20      Visit Diagnosis: Chronic obstructive pulmonary disease, unspecified COPD type (HCC)  Patient's Home Medications on Admission:  Current Outpatient Medications:  .  acetaminophen (TYLENOL) 500 MG tablet, Take 1,000 mg by mouth daily as needed for moderate pain or headache., Disp: , Rfl:  .  ALPRAZolam (XANAX) 0.5 MG tablet, Take 0.5 mg by mouth 2 (two) times daily. , Disp: , Rfl:  .  amLODipine (NORVASC) 5 MG tablet, Take 5 mg by mouth daily., Disp: , Rfl:  .  Ascorbic Acid (VITAMIN C) 1000 MG tablet, Take 1,000 mg by mouth daily., Disp: , Rfl:  .  aspirin EC 81 MG tablet, Take 81 mg by mouth daily., Disp: , Rfl:  .  atorvastatin (LIPITOR) 80 MG tablet, Take 80 mg by mouth daily., Disp: , Rfl:  .  BREZTRI AEROSPHERE 160-9-4.8 MCG/ACT AERO, TAKE 2 PUFFS BY MOUTH TWICE A DAY (STOP TRELEGY), Disp: 32.1 g, Rfl: 3 .  esomeprazole (NEXIUM) 40 MG capsule, Take 40 mg by mouth at bedtime. , Disp: , Rfl:  .  ezetimibe (ZETIA) 10 MG tablet, Take 10 mg by mouth at bedtime. , Disp: , Rfl:  .  folic acid (FOLVITE) 400 MCG tablet, Take 400 mcg by mouth every evening., Disp: , Rfl:  .  furosemide (LASIX) 40 MG tablet, TAKE 1 TABLET BY MOUTH EVERY DAY, Disp: 90 tablet, Rfl: 2 .  glipiZIDE (GLUCOTROL) 5 MG tablet, Take 0.5 tablets (2.5 mg total) by mouth daily before breakfast. (Patient taking differently: Take 5 mg by mouth See admin instructions. Take 5 mg in the morning and take a second 5 mg dose at night on Mon, Wed, and Fri), Disp: 30 tablet, Rfl: 1 .  ipratropium-albuterol (DUONEB) 0.5-2.5 (3) MG/3ML SOLN, Take 3 mLs by  nebulization every 6 (six) hours as needed. (Patient taking differently: Take 3 mLs by nebulization every 6 (six) hours as needed (shortness of breath). ), Disp: 360 mL, Rfl: 1 .  losartan (COZAAR) 100 MG tablet, TAKE 1 TABLET BY MOUTH EVERY DAY Please call to schedule appointment for further refills. Thank you! (Patient taking differently: 50 mg. TAKE 1 TABLET BY MOUTH EVERY DAY Please call to schedule appointment for further refills. Thank you!), Disp: 90 tablet, Rfl: 0 .  Melatonin 10 MG CAPS, Take 10 mg by mouth at bedtime., Disp: , Rfl:  .  metFORMIN (GLUCOPHAGE) 1000 MG tablet, TAKE 1 TABLET BY MOUTH TWICE DAILY WITH A MEAL (Patient taking differently: Take 1,000 mg by mouth 2 (two) times daily. ), Disp: 60 tablet, Rfl: 0 .  metoprolol succinate (TOPROL-XL) 100 MG 24 hr tablet, TAKE 1 TABLET BY MOUTH EVERY DAY WITH OR IMMEDIATELY FOLLOWING A MEAL, Disp: 90 tablet, Rfl: 1 .  PROAIR HFA 108 (90 Base) MCG/ACT inhaler, INL 2 PFS PO Q 6 H PRN, Disp: , Rfl:  .  Vitamin D, Cholecalciferol, 1000 units TABS, Take 1,000 Units by mouth every morning. , Disp: , Rfl:   Past Medical History: Past Medical History:  Diagnosis Date  . Bilateral carotid bruits    a. 01/2018 U/S: < 50% bilat ICA stenosis.  Marland Kitchen CAD (coronary artery  disease)    a. 1998 s/p MI and BMS [place], [place]); b. 1999 redo PCI/rotablator in setting of what sounds like ISR;  c. Multiple stress tests over the years - last ~ 2017, reportedly nl; d. 01/2018 NSTEMI/Cath: LM 109m/d, LAD 50p, 40p/m, D1 60ost, OM1 95, RCA 100ost/p w/ L->R collats, EF 45%; e. s/p 3V CABG 02/19/18 (LIMA-LAD, VG-D1, VG-OM)  . Chronic lower back pain   . COPD (chronic obstructive pulmonary disease) (HCC)   . GIB (gastrointestinal bleeding)    a. 02/2018 GIB and anemia w/ Hgb of 4.7 on presentation; b. 03/2018 EGD: 2 nonbleeding duodenal ulcers.  Marland Kitchen HTN (hypertension)   . Hypercholesteremia   . Ischemic cardiomyopathy    a. 01/2018 Echo: EF 40-45%, mid-apicalanteroseptal,  ant, apical sev HK, mod apicalinferior HK. Gr2 DD. Mod AS, mild MR, mod dil LA, PASP .  . Moderate aortic stenosis    a. 01/2018 Echo: Mod AS, mean grad (S) , Valve area (VTI) 1.06 cm^2, (Vmax) 1.27 cm^2; b. 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  . Myocardial infarction (HCC) ~ 1998/1999  . 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 02/19/2018 AVR: 25 mm Edwards Inspiris Resilia stented bovine pericardial tissue valve  . 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 Tobacco abuse   . Type II diabetes mellitus (HCC)     Tobacco Use: Social History   Tobacco Use  Smoking Status Former Smoker  . Packs/day: 1.00  . Years: 40.00  . Pack years: 40.00  . Types: Cigarettes  . Quit date: 02/02/2018  . Years since quitting: 2.8  Smokeless Tobacco Never Used  Tobacco Comment   Quit 01/2018 - on 05/01/2018 talked to Philip about Relas[employer] concerns. He ststaes that he has no taste for tobacco since he quit in Feb.    Labs: Recent Review Flowsheet Data    Labs for ITP Cardiac and Pulmonary Rehab Latest Ref Rng & Units 02/19/2018 02/19/2018 02/20/2018 02/20/2018 03/27/2018   Cholestrol 100 - 199 mg/dL - - - - 782   LDLCALC 0 - 99 mg/dL - - - - 50   HDL >95 mg/dL - - - - 62(Z)   Trlycerides 0 - 149 mg/dL - - - - 84   Hemoglobin A1c 4.8 - 5.6 % - - - - -   PHART 7.350 - 7.450 - 7.335(L) 7.361 7.331(L) -   PCO2ART 32.0 - 48.0 mmHg - 40.2 40.1 41.0 -   HCO3 20.0 - 28.0 mmol/L - 21.2 22.5 21.5 -   TCO2 22 - 32 mmol/L 24 22 24 23  -   ACIDBASEDEF 0.0 - 2.0 mmol/L - 4.0(H) 2.0 4.0(H) -   O2SAT % - 97.0 97.0 95.0 -       Pulmonary Assessment Scores:  Pulmonary Assessment Scores    Row Name 09/13/20 1422         ADL UCSD   SOB Score total 21     Rest 0     Walk 2     Stairs 3     Bath 2     Dress 0     Shop 0           CAT Score   CAT Score 16           mMRC Score   mMRC Score 3             UCSD: Self-administered  rating of dyspnea associated with activities of daily living (ADLs) 6-point scale (0 = "not at all" to 5 = "maximal or unable to do because of breathlessness")  Scoring Scores range from 0 to 120.  Minimally important difference is 5 units  CAT: CAT can identify the health impairment of COPD patients and is better correlated with disease progression.  CAT has a scoring range of zero to 40. The CAT score is classified into four groups of low (less than 10), medium (10 - 20), high (21-30) and very high (31-40) based on the impact level of disease on health status. A CAT score over 10 suggests significant symptoms.  A worsening CAT score could be explained by an exacerbation, poor medication adherence, poor inhaler technique, or progression of COPD or comorbid conditions.  CAT MCID is 2 points  mMRC: mMRC (Modified Medical Research Council) Dyspnea Scale is used to assess the degree of baseline functional disability in patients of respiratory disease due to dyspnea. No minimal important difference is established. A decrease in score of 1 point or greater is considered a positive change.   Pulmonary Function Assessment:   Exercise Target Goals: Exercise Program Goal: Individual exercise prescription set using results from initial 6 min walk test and THRR while considering  patient's activity barriers and safety.   Exercise Prescription Goal: Initial exercise prescription builds to 30-45 minutes a day of aerobic activity, 2-3 days per week.  Home exercise guidelines will be given to patient during program as part of exercise prescription that the participant will acknowledge.  Education: Aerobic Exercise: - Group verbal and visual presentation on the components of exercise prescription. Introduces F.I.T.T principle from ACSM for exercise prescriptions.  Reviews F.I.T.T. principles of aerobic exercise including progression. Written material given at graduation. Flowsheet  Row Cardiac Rehab from 09/02/2018 in [facility] Cardiac and Pulmonary Rehab  Date 07/24/18  Educator AS  Instruction Review Code 1- Verbalizes Understanding      Education: Resistance Exercise: - Group verbal and visual presentation on the components of exercise prescription. Introduces F.I.T.T principle from ACSM for exercise prescriptions  Reviews F.I.T.T. principles of resistance exercise including progression. Written material given at graduation.    Education: Exercise & Equipment Safety: - Individual verbal instruction and demonstration of equipment use and safety with use of the equipment. Flowsheet Row Pulmonary Rehab from 09/21/2020 in [facility] Cardiac and Pulmonary Rehab  Date 09/13/20  Educator AS  Instruction Review Code 1- Verbalizes Understanding      Education: Exercise Physiology & General Exercise Guidelines: - Group verbal and written instruction with models to review the exercise physiology of the cardiovascular system and associated critical values. Provides general exercise guidelines with specific guidelines to those with heart or lung disease.  Flowsheet Row Cardiac Rehab from 09/02/2018 in [facility] Cardiac and Pulmonary Rehab  Date 05/20/18  Educator AS  Instruction Review Code 4- No Evidence of Learning  [charting in error]      Education: Flexibility, Balance, Mind/Body Relaxation: - Group verbal and visual presentation with interactive activity on the components of exercise prescription. Introduces F.I.T.T principle from ACSM for exercise prescriptions. Reviews F.I.T.T. principles of flexibility and balance exercise training including progression. Also discusses the mind body connection.  Reviews various relaxation techniques to help reduce and manage stress (i.e. Deep breathing, progressive muscle relaxation, and visualization). Balance handout provided to take home. Written material given at graduation. Flowsheet Row Cardiac Rehab from 09/02/2018 in [facility] Cardiac and  Pulmonary Rehab  Date 07/29/18  Educator AS  Instruction Review Code 1- Verbalizes Understanding      Activity Barriers & Risk Stratification:  Activity Barriers & Cardiac Risk Stratification - 09/12/20 1113      Activity Barriers & Cardiac Risk Stratification   Activity Barriers Shortness of Breath           6 Minute Walk:  6 Minute Walk    Row Name 09/13/20 1415         6 Minute Walk   Phase Initial     Distance 815 feet     Walk Time 5.5 minutes     # of Rest Breaks 3     MPH 1.7     METS 1.8     RPE 16     Perceived Dyspnea  3     VO2 Peak 6.2     Symptoms Yes (comment)     Comments SOB     Resting HR 78 bpm     Resting BP 140/74     Resting Oxygen Saturation  91 %     Exercise Oxygen Saturation  during 6 min walk 84 %     Max Ex. HR 104 bpm     Max Ex. BP 158/64     2 Minute Post BP 138/76           Interval HR   1 Minute HR 85     2 Minute HR 99     3 Minute HR 101     4 Minute HR 104     5 Minute HR 101     6 Minute HR 103     2 Minute Post HR 96     Interval Heart Rate? Yes           Interval Oxygen   Interval Oxygen? Yes     Baseline Oxygen Saturation % 91 %     1 Minute Oxygen Saturation % 85 %     1 Minute Liters of Oxygen 2 L     2 Minute Oxygen Saturation % 84 %     2 Minute Liters of Oxygen 2 L     3 Minute Oxygen Saturation % 84 %     3 Minute Liters of Oxygen 2 L     4 Minute Oxygen Saturation % 85 %     4 Minute Liters of Oxygen 2 L     5 Minute Oxygen Saturation % 85 %     5 Minute Liters of Oxygen 2 L     6 Minute Oxygen Saturation % 86 %     6 Minute Liters of Oxygen 2 L     2 Minute Post Oxygen Saturation % 85 %     2 Minute Post Liters of Oxygen 2 L           Oxygen Initial Assessment:  Oxygen Initial Assessment - 09/12/20 1111      Home Oxygen   Home Oxygen Device Home Concentrator;Portable Concentrator    Sleep Oxygen Prescription Continuous    Liters per minute 2    Home Exercise Oxygen Prescription None     Home Resting Oxygen Prescription Continuous   as needed   Liters per minute 2      Intervention   Short Term Goals To learn and exhibit compliance with exercise, home and travel O2 prescription;To learn and understand importance of monitoring SPO2 with pulse oximeter and demonstrate accurate use of the pulse oximeter.;To learn and understand importance of maintaining oxygen  saturations>88%;To learn and demonstrate proper pursed lip breathing techniques or other breathing techniques.;To learn and demonstrate proper use of respiratory medications    Long  Term Goals Exhibits compliance with exercise, home and travel O2 prescription;Verbalizes importance of monitoring SPO2 with pulse oximeter and return demonstration;Maintenance of O2 saturations>88%;Exhibits proper breathing techniques, such as pursed lip breathing or other method taught during program session;Compliance with respiratory medication;Demonstrates proper use of MDI's           Oxygen Re-Evaluation:  Oxygen Re-Evaluation    Row Name 09/19/20 1127 10/07/20 1113 11/02/20 1107 11/30/20 1118       Program Oxygen Prescription   Program Oxygen Prescription - Continuous;E-Tanks Continuous;E-Tanks Continuous;E-Tanks    Liters per minute - 3 3 3          Home Oxygen   Home Oxygen Device Home Concentrator;Portable Concentrator Home Concentrator;Portable Concentrator Home Concentrator;Portable Concentrator Home Concentrator;Portable Concentrator    Sleep Oxygen Prescription Continuous Continuous Continuous Continuous    Liters per minute 2 2 2 2     Home Exercise Oxygen Prescription None Continuous Continuous Continuous    Liters per minute - 2 2 2     Home Resting Oxygen Prescription Continuous Continuous Continuous Continuous    Liters per minute 2 2 2 2     Compliance with Home Oxygen Use - Yes No  He will use it overnight and will use it when he feels he needs it. Yes         Goals/Expected Outcomes   Short Term Goals To learn and  exhibit compliance with exercise, home and travel O2 prescription;To learn and understand importance of monitoring SPO2 with pulse oximeter and demonstrate accurate use of the pulse oximeter.;To learn and understand importance of maintaining oxygen saturations>88%;To learn and demonstrate proper pursed lip breathing techniques or other breathing techniques.;To learn and demonstrate proper use of respiratory medications To learn and understand importance of maintaining oxygen saturations>88%;To learn and understand importance of monitoring SPO2 with pulse oximeter and demonstrate accurate use of the pulse oximeter. To learn and understand importance of maintaining oxygen saturations>88%;To learn and understand importance of monitoring SPO2 with pulse oximeter and demonstrate accurate use of the pulse oximeter. To learn and understand importance of maintaining oxygen saturations>88%;To learn and understand importance of monitoring SPO2 with pulse oximeter and demonstrate accurate use of the pulse oximeter.;To learn and exhibit compliance with exercise, home and travel O2 prescription;To learn and demonstrate proper pursed lip breathing techniques or other breathing techniques.;To learn and demonstrate proper use of respiratory medications    Long  Term Goals Exhibits compliance with exercise, home and travel O2 prescription;Verbalizes importance of monitoring SPO2 with pulse oximeter and return demonstration;Maintenance of O2 saturations>88%;Exhibits proper breathing techniques, such as pursed lip breathing or other method taught during program session;Compliance with respiratory medication;Demonstrates proper use of MDI's Maintenance of O2 saturations>88%;Verbalizes importance of monitoring SPO2 with pulse oximeter and return demonstration Maintenance of O2 saturations>88%;Verbalizes importance of monitoring SPO2 with pulse oximeter and return demonstration Exhibits compliance with exercise, home and travel O2  prescription;Verbalizes importance of monitoring SPO2 with pulse oximeter and return demonstration;Maintenance of O2 saturations>88%;Exhibits proper breathing techniques, such as pursed lip breathing or other method taught during program session;Compliance with respiratory medication;Demonstrates proper use of MDI's    Comments Reviewed PLB technique with pt.  Talked about how it works and it's importance in maintaining their exercise saturations. He has a pulse oximeter to check his oxygen saturation at home. Informed and explained why it is important to have one.  Reviewed that oxygen saturations should be 88 percent and above. Patient has a pulse oximeter at home to check his oxygen. He reports cheking O2 2-3x/day and uses his nebulizer 2x/day. His O2 is usually 80-90, expressed importance of keeping O2 over 88. Reviewed PLB. Kipp is staying compliant with his oxygen and his breathing is getting better.  He continues to monitor his saturations and doing well with his breathing and using his PLB.    Goals/Expected Outcomes Short: Become more profiecient at using PLB.   Long: Become independent at using PLB. Short: monitor oxygen at home with exertion. Long: maintain oxygen saturations above 88 percent independently. ST: use O2 as prescribed at home LT: maintain O2 above 88 percent independently. Short: Continue to breathing better  Long; Continue to improve compliance           Oxygen Discharge (Final Oxygen Re-Evaluation):  Oxygen Re-Evaluation - 11/30/20 1118      Program Oxygen Prescription   Program Oxygen Prescription Continuous;E-Tanks    Liters per minute 3      Home Oxygen   Home Oxygen Device Home Concentrator;Portable Concentrator    Sleep Oxygen Prescription Continuous    Liters per minute 2    Home Exercise Oxygen Prescription Continuous    Liters per minute 2    Home Resting Oxygen Prescription Continuous    Liters per minute 2    Compliance with Home Oxygen Use Yes       Goals/Expected Outcomes   Short Term Goals To learn and understand importance of maintaining oxygen saturations>88%;To learn and understand importance of monitoring SPO2 with pulse oximeter and demonstrate accurate use of the pulse oximeter.;To learn and exhibit compliance with exercise, home and travel O2 prescription;To learn and demonstrate proper pursed lip breathing techniques or other breathing techniques.;To learn and demonstrate proper use of respiratory medications    Long  Term Goals Exhibits compliance with exercise, home and travel O2 prescription;Verbalizes importance of monitoring SPO2 with pulse oximeter and return demonstration;Maintenance of O2 saturations>88%;Exhibits proper breathing techniques, such as pursed lip breathing or other method taught during program session;Compliance with respiratory medication;Demonstrates proper use of MDI's    Comments Abdurahman is staying compliant with his oxygen and his breathing is getting better.  He continues to monitor his saturations and doing well with his breathing and using his PLB.    Goals/Expected Outcomes Short: Continue to breathing better  Long; Continue to improve compliance           Initial Exercise Prescription:  Initial Exercise Prescription - 09/13/20 1400      Date of Initial Exercise RX and Referring Provider   Date 09/13/20    Referring Provider Jayme Cloud      Recumbant Bike   Level 1    RPM 60    Minutes 15    METs 1.8      NuStep   Level 1    SPM 80    Minutes 15    METs 1.8      Arm Ergometer   Level 1    RPM 30    Minutes 15    METs 1.8      Prescription Details   Frequency (times per week) 3    Duration Progress to 30 minutes of continuous aerobic without signs/symptoms of physical distress      Intensity   THRR 40-80% of Max Heartrate 104-131    Ratings of Perceived Exertion 11-13    Perceived Dyspnea 0-4  Resistance Training   Training Prescription Yes    Weight 3 lb    Reps 10-15            Perform Capillary Blood Glucose checks as needed.  Exercise Prescription Changes:  Exercise Prescription Changes    Row Name 09/13/20 1400 09/22/20 0700 10/04/20 0800 10/19/20 0800 10/31/20 1500     Response to Exercise   Blood Pressure (Admit) 140/74 132/68 154/68 160/80 142/70   Blood Pressure (Exercise) 158/64 142/70 176/70 144/80 150/64   Blood Pressure (Exit) 138/76 132/64 144/74 130/80 122/58   Heart Rate (Admit) 78 bpm 83 bpm 79 bpm 80 bpm 82 bpm   Heart Rate (Exercise) 104 bpm 91 bpm 88 bpm 92 bpm 86 bpm   Heart Rate (Exit) 96 bpm 79 bpm 80 bpm 82 bpm 78 bpm   Oxygen Saturation (Admit) 91 % 83 % 91 % 88 % 92 %   Oxygen Saturation (Exercise) 84 % 89 % 92 % 92 % 88 %   Oxygen Saturation (Exit) 85 % 92 % 95 % 92 % 97 %   Rating of Perceived Exertion (Exercise) 16 13 11 13 11    Perceived Dyspnea (Exercise) - 1 1 0 2   Symptoms SOB - SOB - none   Duration - Continue with 30 min of aerobic exercise without signs/symptoms of physical distress. Continue with 30 min of aerobic exercise without signs/symptoms of physical distress. Continue with 30 min of aerobic exercise without signs/symptoms of physical distress. Continue with 30 min of aerobic exercise without signs/symptoms of physical distress.   Intensity - THRR unchanged THRR unchanged THRR unchanged THRR unchanged     Progression   Progression - Continue to progress workloads to maintain intensity without signs/symptoms of physical distress. Continue to progress workloads to maintain intensity without signs/symptoms of physical distress. Continue to progress workloads to maintain intensity without signs/symptoms of physical distress. Continue to progress workloads to maintain intensity without signs/symptoms of physical distress.   Average METs - 2.2 2.3 2.5 2.8     Resistance Training   Training Prescription - Yes Yes Yes Yes   Weight - 5 lb 5 lb 5 lb 5 lb   Reps - 10-15 10-15 10-15 10-15     Interval Training    Interval Training - - No No No     Oxygen   Oxygen - - Continuous Continuous Continuous   Liters - - 3 3 3      NuStep   Level - 1 6 6 6    SPM - 80 - 80 -   Minutes - 15 30 15 15    METs - 2.2 2.3 2.5 2.8   Row Name 11/14/20 1600 11/30/20 1400           Response to Exercise   Blood Pressure (Admit) 138/70 118/60      Blood Pressure (Exercise) 142/76 130/70      Blood Pressure (Exit) 128/58 118/58      Heart Rate (Admit) 83 bpm 100 bpm      Heart Rate (Exercise) 89 bpm 888 bpm      Heart Rate (Exit) 87 bpm 81 bpm      Oxygen Saturation (Admit) 94 % 90 %      Oxygen Saturation (Exercise) 93 % 92 %      Oxygen Saturation (Exit) 92 % 95 %      Rating of Perceived Exertion (Exercise) 11 13      Perceived Dyspnea (Exercise) - 2  Symptoms none SOB      Duration Continue with 30 min of aerobic exercise without signs/symptoms of physical distress. Continue with 30 min of aerobic exercise without signs/symptoms of physical distress.      Intensity THRR unchanged THRR unchanged             Progression   Progression Continue to progress workloads to maintain intensity without signs/symptoms of physical distress. Continue to progress workloads to maintain intensity without signs/symptoms of physical distress.      Average METs 2.15 2.4             Resistance Training   Training Prescription Yes Yes      Weight 5 lb 5 lb      Reps 10-15 10-15             Interval Training   Interval Training No No             Oxygen   Oxygen Continuous Continuous      Liters 3 3             NuStep   Level 4 6      SPM 80 -      Minutes 15 15      METs 2.15 2.4             Home Exercise Plan   Plans to continue exercise at - Home (comment)  bike, walking      Frequency - Add 2 additional days to program exercise sessions.      Initial Home Exercises Provided - 11/02/20             Exercise Comments:   Exercise Goals and Review:  Exercise Goals    Row Name 09/13/20 1421              Exercise Goals   Increase Physical Activity Yes       Intervention Provide advice, education, support and counseling about physical activity/exercise needs.;Develop an individualized exercise prescription for aerobic and resistive training based on initial evaluation findings, risk stratification, comorbidities and participant's personal goals.       Expected Outcomes Short Term: Attend rehab on a regular basis to increase amount of physical activity.;Long Term: Add in home exercise to make exercise part of routine and to increase amount of physical activity.;Long Term: Exercising regularly at least 3-5 days a week.       Increase Strength and Stamina Yes       Intervention Provide advice, education, support and counseling about physical activity/exercise needs.;Develop an individualized exercise prescription for aerobic and resistive training based on initial evaluation findings, risk stratification, comorbidities and participant's personal goals.       Expected Outcomes Short Term: Increase workloads from initial exercise prescription for resistance, speed, and METs.;Short Term: Perform resistance training exercises routinely during rehab and add in resistance training at home;Long Term: Improve cardiorespiratory fitness, muscular endurance and strength as measured by increased METs and functional capacity ( )       Able to understand and use rate of perceived exertion (RPE) scale Yes       Intervention Provide education and explanation on how to use RPE scale       Expected Outcomes Short Term: Able to use RPE daily in rehab to express subjective intensity level;Long Term:  Able to use RPE to guide intensity level when exercising independently       Able to understand and use Dyspnea scale Yes  Intervention Provide education and explanation on how to use Dyspnea scale       Expected Outcomes Short Term: Able to use Dyspnea scale daily in rehab to express subjective sense of  shortness of breath during exertion;Long Term: Able to use Dyspnea scale to guide intensity level when exercising independently       Knowledge and understanding of Target Heart Rate Range (THRR) Yes       Intervention Provide education and explanation of THRR including how the numbers were predicted and where they are located for reference       Expected Outcomes Short Term: Able to state/look up THRR;Short Term: Able to use daily as guideline for intensity in rehab;Long Term: Able to use THRR to govern intensity when exercising independently       Able to check pulse independently Yes       Intervention Provide education and demonstration on how to check pulse in carotid and radial arteries.;Review the importance of being able to check your own pulse for safety during independent exercise       Expected Outcomes Short Term: Able to explain why pulse checking is important during independent exercise;Long Term: Able to check pulse independently and accurately       Understanding of Exercise Prescription Yes       Intervention Provide education, explanation, and written materials on patient's individual exercise prescription       Expected Outcomes Short Term: Able to explain program exercise prescription;Long Term: Able to explain home exercise prescription to exercise independently              Exercise Goals Re-Evaluation :  Exercise Goals Re-Evaluation    Row Name 09/19/20 1126 09/22/20 0734 10/04/20 0854 10/07/20 1123 10/19/20 0826     Exercise Goal Re-Evaluation   Exercise Goals Review Increase Physical Activity;Able to understand and use rate of perceived exertion (RPE) scale;Knowledge and understanding of Target Heart Rate Range (THRR);Understanding of Exercise Prescription;Increase Strength and Stamina;Able to understand and use Dyspnea scale;Able to check pulse independently Increase Physical Activity;Increase Strength and Stamina Increase Physical Activity;Increase Strength and  Stamina;Understanding of Exercise Prescription Understanding of Exercise Prescription Increase Physical Activity;Increase Strength and Stamina   Comments Reviewed RPE and dyspnea scales, THR and program prescription with pt today.  Pt voiced understanding and was given a copy of goals to take home. Rogue is off to a good start in [facility].  Staff will monitor progress. Cristo has requested to just use the T4 NuStep as he does not like the other machines, they feel uncomfortable. He does alternate between using both arms and legs, just legs, and just arms.  He also has difficulty with his mask,but does better with the exercise mask.  We will continue to montior his progress on the NuStep. Raph states that he is interested in the [place] once he is done with the program. He is going to look into his insurance to see if he has [employer]. Informed him that the gym has oxygen tanks and would be good to continue his exercise. Calib has been attending twice per week most weeks.  Staff will review home exercise so he can add 1-2 days at home for better progress.  He has moved up to level 6 on NS.   Expected Outcomes Short: Use RPE daily to regulate intensity. Long: Follow program prescription in THR. Short:  attend consistently Long:  improve stamina Short: Maintain spm consistently Long; Conitnue to improve stamina. Short: check with insurance if he  has [employer]. Long: maintain an exercise regimine post [facility]. Short: review home exercise Long: exercise 3-5 days per week consistently   Row Name 10/31/20 1517 11/02/20 1141 11/14/20 1642 11/30/20 1112       Exercise Goal Re-Evaluation   Exercise Goals Review Increase Physical Activity;Increase Strength and Stamina;Understanding of Exercise Prescription Increase Physical Activity;Increase Strength and Stamina;Understanding of Exercise Prescription Increase Physical Activity;Increase Strength and Stamina;Understanding of Exercise Prescription Increase  Physical Activity;Increase Strength and Stamina;Understanding of Exercise Prescription    Comments Only attended once since last review. Izreal continues to just use the same station as it does not hurt him at all.  We will continue to montior his progress. Reviewed home exercise with pt today.  Pt plans to walk/bike, may go to [facility] for exercise.  Reviewed THR, pulse, RPE, sign and symptoms, pulse oximetery and when to call 911 or MD.  Also discussed weather considerations and indoor options.  Pt voiced understanding. Laster lowered level on T4 today.  Staff will continue to monitor progress. Krish is doing well in rehab.  He is walking on his off days.  He is feeling stronger overall and better.    Expected Outcomes Short: Return to regular attendance  Long: Continue to exercise regularly. - Short: get back up to previous levels Long: maintain consistent exercise Short: Continue to stay active  Long: Continue to improve stamina.           Discharge Exercise Prescription (Final Exercise Prescription Changes):  Exercise Prescription Changes - 11/30/20 1400      Response to Exercise   Blood Pressure (Admit) 118/60    Blood Pressure (Exercise) 130/70    Blood Pressure (Exit) 118/58    Heart Rate (Admit) 100 bpm    Heart Rate (Exercise) 888 bpm    Heart Rate (Exit) 81 bpm    Oxygen Saturation (Admit) 90 %    Oxygen Saturation (Exercise) 92 %    Oxygen Saturation (Exit) 95 %    Rating of Perceived Exertion (Exercise) 13    Perceived Dyspnea (Exercise) 2    Symptoms SOB    Duration Continue with 30 min of aerobic exercise without signs/symptoms of physical distress.    Intensity THRR unchanged      Progression   Progression Continue to progress workloads to maintain intensity without signs/symptoms of physical distress.    Average METs 2.4      Resistance Training   Training Prescription Yes    Weight 5 lb    Reps 10-15      Interval Training   Interval Training No      Oxygen    Oxygen Continuous    Liters 3      NuStep   Level 6    Minutes 15    METs 2.4      Home Exercise Plan   Plans to continue exercise at Home (comment)   bike, walking   Frequency Add 2 additional days to program exercise sessions.    Initial Home Exercises Provided 11/02/20           Nutrition:  Target Goals: Understanding of nutrition guidelines, daily intake of sodium 1500mg , cholesterol 200mg , calories 30% from fat and 7% or less from saturated fats, daily to have 5 or more servings of fruits and vegetables.  Education: All About Nutrition: -Group instruction provided by verbal, written material, interactive activities, discussions, models, and posters to present general guidelines for heart healthy nutrition including fat, fiber, MyPlate, the role of sodium  in heart healthy nutrition, utilization of the nutrition label, and utilization of this knowledge for meal planning. Follow up email sent as well. Written material given at graduation. Flowsheet Row Cardiac Rehab from 09/02/2018 in [facility] Cardiac and Pulmonary Rehab  Date 08/26/18  Educator LB  Instruction Review Code 1- Verbalizes Understanding      Biometrics:  Pre Biometrics - 09/13/20 1422      Pre Biometrics   Height 5\' 5"  (1.651 m)    Weight 182 lb 14.4 oz (83 kg)    BMI (Calculated) 30.44    Single Leg Stand 10 seconds            Nutrition Therapy Plan and Nutrition Goals:  Nutrition Therapy & Goals - 11/02/20 1124      Personal Nutrition Goals   Nutrition Goal No goals at this time.    Comments Got two more books on DM at the book store this weekend. B; cereal - cherrios, rice krispies, and oatmeal, eggs and bacon, tail of ham - egss and cheese on a roll. L: sometimes will skip D: loves pasta. He eats salads, chicken, steak, escarole and beans. He doesn't care for poatoes aside from hashbrowns. S: cookies - sleep time tea - he will also have protein drinks with his coffee in the afternoon. He reports using  stevia and limiting simple sugars. He reports being through education before and not wanting to make any changes at this time.           Nutrition Assessments:  Nutrition Assessments - 09/13/20 1425      MEDFICTS Scores   Pre Score 65          MEDIFICTS Score Key:  ?70 Need to make dietary changes   40-70 Heart Healthy Diet  ? 40 Therapeutic Level Cholesterol Diet   Picture Your Plate Scores:  <16 Unhealthy dietary pattern with much room for improvement.  41-50 Dietary pattern unlikely to meet recommendations for good health and room for improvement.  51-60 More healthful dietary pattern, with some room for improvement.   >60 Healthy dietary pattern, although there may be some specific behaviors that could be improved.   Nutrition Goals Re-Evaluation:  Nutrition Goals Re-Evaluation    Row Name 11/30/20 1117             Goals   Nutrition Goal Eat healthier       Comment Demba has gotten better about his diet and is really trying to be good through holidays.  He is feeling better about it..       Expected Outcome Short: Continue to focus on heart healthy Long; Continue to work on weight loss              Nutrition Goals Discharge (Final Nutrition Goals Re-Evaluation):  Nutrition Goals Re-Evaluation - 11/30/20 1117      Goals   Nutrition Goal Eat healthier    Comment Geovannie has gotten better about his diet and is really trying to be good through holidays.  He is feeling better about it..    Expected Outcome Short: Continue to focus on heart healthy Long; Continue to work on weight loss           Psychosocial: Target Goals: Acknowledge presence or absence of significant depression and/or stress, maximize coping skills, provide positive support system. Participant is able to verbalize types and ability to use techniques and skills needed for reducing stress and depression.   Education: Stress, Anxiety, and Depression - Group verbal  and visual presentation  to define topics covered.  Reviews how body is impacted by stress, anxiety, and depression.  Also discusses healthy ways to reduce stress and to treat/manage anxiety and depression.  Written material given at graduation. Flowsheet Row Pulmonary Rehab from 09/21/2020 in [facility] Cardiac and Pulmonary Rehab  Date 09/21/20  Educator [facility]  Instruction Review Code 1- [employer] Understanding      Education: Sleep Hygiene -Provides group verbal and written instruction about how sleep can affect your health.  Define sleep hygiene, discuss sleep cycles and impact of sleep habits. Review good sleep hygiene tips.  Flowsheet Row Cardiac Rehab from 09/02/2018 in [facility] Cardiac and Pulmonary Rehab  Date 08/19/18  Educator [facility]  Instruction Review Code 1- Verbalizes Understanding  [Arrived late to class]      Initial Review & Psychosocial Screening:  Initial Psych Review & Screening - 09/12/20 1116      Initial Review   Current issues with Current Anxiety/Panic      Family Dynamics   Good Support System? Yes   Johnnye Sima, 6 years, family     Barriers   Psychosocial barriers to participate in program There are no identifiable barriers or psychosocial needs.;The patient should benefit from training in stress management and relaxation.;Psychosocial barriers identified (see note)      Screening Interventions   Interventions Encouraged to exercise    Expected Outcomes Short Term goal: Utilizing psychosocial counselor, staff and physician to assist with identification of specific Stressors or current issues interfering with healing process. Setting desired goal for each stressor or current issue identified.;Long Term Goal: Stressors or current issues are controlled or eliminated.;Short Term goal: Identification and review with participant of any Quality of Life or Depression concerns found by scoring the questionnaire.;Long Term goal: The participant improves quality of Life and PHQ9 Scores as seen by post scores  and/or verbalization of changes           Quality of Life Scores:  Scores of 19 and below usually indicate a poorer quality of life in these areas.  A difference of  2-3 points is a clinically meaningful difference.  A difference of 2-3 points in the total score of the Quality of Life Index has been associated with significant improvement in overall quality of life, self-image, physical symptoms, and general health in studies assessing change in quality of life.  PHQ-9: Recent Review Flowsheet Data    Depression screen [facility] 2/9 09/13/2020 08/26/2018 05/01/2018   Decreased Interest 0 0 0   Down, Depressed, Hopeless 0 0 0   PHQ - 2 Score 0 0 0   Altered sleeping 0 0 0   Tired, decreased energy 1 1 0   Change in appetite 0 - 0   Feeling bad or failure about yourself  0 0 0   Trouble concentrating 0 0 0   Moving slowly or fidgety/restless 0 0 0   Suicidal thoughts 0 0 0   PHQ-9 Score 1 1 0   Difficult doing work/chores Not difficult at all Not difficult at all Not difficult at all     Interpretation of Total Score  Total Score Depression Severity:  1-4 = Minimal depression, 5-9 = Mild depression, 10-14 = Moderate depression, 15-19 = Moderately severe depression, 20-27 = Severe depression   Psychosocial Evaluation and Intervention:  Psychosocial Evaluation - 09/12/20 1136      Psychosocial Evaluation & Interventions   Comments Darragh doe not have any barriers to attending the program.  He remembers  attending Cardiac Rehab several years ago and is looking forward to starting Pulmonary Rehab. He does get anxiety at time when his breathing is hard. He has learned to slow down, and reset to help get his breathing back to his normal. He lives with his Julio Sicks and their 2 dogs. He has a great support system with [name] and his family. He continues to be active, traveling and getting out his bike. He has purchased his own portable oxygen concentrator for at home and traveling. He should do  great in the program.    Expected Outcomes STG: Romelle will attend all scheduled sessions to gain the most from the program. LTG: Izan will see improvement in his ADL activity and be able to maintain this improvement after discharge.    Continue Psychosocial Services  Follow up required by staff           Psychosocial Re-Evaluation:  Psychosocial Re-Evaluation    Row Name 10/07/20 1118 11/02/20 1111 11/30/20 1114         Psychosocial Re-Evaluation   Current issues with Current Stress Concerns;Current Anxiety/Panic Current Anxiety/Panic Current Anxiety/Panic;Current Stress Concerns     Comments Amir gets anxious when he gets short of breath. His breathing is the only issue that gets him down. He states that his mind is sharp and is willing to get healthier to breath easier. He reports he is on xanax for anxiety for 7 years and reports it's helping. He reports getting overwhelmed sometimes with anxiety when things don't go right, gotten woirse since his wife passed 7 years ago. He will ride his motorcycle and watches TV to reduce stress and anxiety, he has techniques where he reduces his panic - ice cold water. Support system in girlfriend who he lives with and chicldren who are in [place]. Denim is doing well with exercise.  He is frustrated about not seeing his kids and grandkids for Christmas, but they do have plans to go up in January.  He is then just worried about the cost of gas and travel.  He is planning to ride his motorcycle this weekend.  He is sleeping well and breathing better with the cooler air.  His anxiety has been up a little with his frustrations, but overall doing well on his xanax.     Expected Outcomes Short: continue [facility] to improve shortness of breath. Long: maintain exercise to keep stress at a minimum. Short: continue [facility] to improve shortness of breath. Long: maintain exercise to keep stress at a minimum, utilize support system, keep using relaxing activities and  techniques. Short: Look forward to traveling after Christmas.  Long: Continue to manage activities.     Interventions Encouraged to attend Pulmonary Rehabilitation for the exercise Encouraged to attend Pulmonary Rehabilitation for the exercise Encouraged to attend Pulmonary Rehabilitation for the exercise     Continue Psychosocial Services  Follow up required by staff Follow up required by staff Follow up required by staff           Initial Review   Source of Stress Concerns - Chronic Illness -            Psychosocial Discharge (Final Psychosocial Re-Evaluation):  Psychosocial Re-Evaluation - 11/30/20 1114      Psychosocial Re-Evaluation   Current issues with Current Anxiety/Panic;Current Stress Concerns    Comments Jetson is doing well with exercise.  He is frustrated about not seeing his kids and grandkids for Christmas, but they do have plans to go up in January.  He is then just worried about the cost of gas and travel.  He is planning to ride his motorcycle this weekend.  He is sleeping well and breathing better with the cooler air.  His anxiety has been up a little with his frustrations, but overall doing well on his xanax.    Expected Outcomes Short: Look forward to traveling after Christmas.  Long: Continue to manage activities.    Interventions Encouraged to attend Pulmonary Rehabilitation for the exercise    Continue Psychosocial Services  Follow up required by staff           Education: Education Goals: Education classes will be provided on a weekly basis, covering required topics. Participant will state understanding/return demonstration of topics presented.  Learning Barriers/Preferences:  Learning Barriers/Preferences - 09/12/20 1121      Learning Barriers/Preferences   Learning Barriers None    Learning Preferences None           General Pulmonary Education Topics:  Infection Prevention: - Provides verbal and written material to individual with discussion of  infection control including proper hand washing and proper equipment cleaning during exercise session. Flowsheet Row Pulmonary Rehab from 09/21/2020 in [facility] Cardiac and Pulmonary Rehab  Date 09/13/20  Educator AS  Instruction Review Code 1- Verbalizes Understanding      Falls Prevention: - Provides verbal and written material to individual with discussion of falls prevention and safety. Flowsheet Row Pulmonary Rehab from 09/21/2020 in [facility] Cardiac and Pulmonary Rehab  Date 09/13/20  Educator AS  Instruction Review Code 1- Verbalizes Understanding      Chronic Lung Disease Review: - Group verbal instruction with posters, models, PowerPoint presentations and videos,  to review new updates, new respiratory medications, new advancements in procedures and treatments. Providing information on websites and "800" numbers for continued self-education. Includes information about supplement oxygen, available portable oxygen systems, continuous and intermittent flow rates, oxygen safety, concentrators, and Medicare reimbursement for oxygen. Explanation of Pulmonary Drugs, including class, frequency, complications, importance of spacers, rinsing mouth after steroid MDI's, and proper cleaning methods for nebulizers. Review of basic lung anatomy and physiology related to function, structure, and complications of lung disease. Review of risk factors. Discussion about methods for diagnosing sleep apnea and types of masks and machines for OSA. Includes a review of the use of types of environmental controls: home humidity, furnaces, filters, dust mite/pet prevention, HEPA vacuums. Discussion about weather changes, air quality and the benefits of nasal washing. Instruction on Warning signs, infection symptoms, calling MD promptly, preventive modes, and value of vaccinations. Review of effective airway clearance, coughing and/or vibration techniques. Emphasizing that all should Create an Action Plan. Written material  given at graduation.   AED/CPR: - Group verbal and written instruction with the use of models to demonstrate the basic use of the AED with the basic ABC's of resuscitation. Flowsheet Row Cardiac Rehab from 09/02/2018 in [facility] Cardiac and Pulmonary Rehab  Date 09/02/18  Educator [place]  Instruction Review Code 1- Verbalizes Understanding       Anatomy and Cardiac Procedures: - Group verbal and visual presentation and models provide information about basic cardiac anatomy and function. Reviews the testing methods done to diagnose heart disease and the outcomes of the test results. Describes the treatment choices: Medical Management, Angioplasty, or Coronary Bypass Surgery for treating various heart conditions including Myocardial Infarction, Angina, Valve Disease, and Cardiac Arrhythmias.  Written material given at graduation. Flowsheet Row Cardiac Rehab from 09/02/2018 in [facility] Cardiac and Pulmonary Rehab  Date 08/07/18  Educator CE  Instruction Review Code 1- Verbalizes Understanding      Medication Safety: - Group verbal and visual instruction to review commonly prescribed medications for heart and lung disease. Reviews the medication, class of the drug, and side effects. Includes the steps to properly store meds and maintain the prescription regimen.  Written material given at graduation. Flowsheet Row Cardiac Rehab from 09/02/2018 in [facility] Cardiac and Pulmonary Rehab  Date 08/12/18  Educator SB  Instruction Review Code 1- Verbalizes Understanding      Other: -Provides group and verbal instruction on various topics (see comments)   Knowledge Questionnaire Score:  Knowledge Questionnaire Score - 09/13/20 1424      Knowledge Questionnaire Score   Pre Score 16/18            Core Components/Risk Factors/Patient Goals at Admission:  Personal Goals and Risk Factors at Admission - 09/13/20 1426      Core Components/Risk Factors/Patient Goals on Admission    Weight Management  Yes;Weight Loss    Intervention Weight Management: Develop a combined nutrition and exercise program designed to reach desired caloric intake, while maintaining appropriate intake of nutrient and fiber, sodium and fats, and appropriate energy expenditure required for the weight goal.    Admit Weight 182 lb 14.4 oz (83 kg)    Goal Weight: Short Term 180 lb (81.6 kg)    Goal Weight: Long Term 175 lb (79.4 kg)    Expected Outcomes Short Term: Continue to assess and modify interventions until short term weight is achieved;Long Term: Adherence to nutrition and physical activity/exercise program aimed toward attainment of established weight goal;Weight Loss: Understanding of general recommendations for a balanced deficit meal plan, which promotes 1-2 lb weight loss per week and includes a negative energy balance of [phone] kcal/d    Diabetes Yes    Intervention Provide education about signs/symptoms and action to take for hypo/hyperglycemia.;Provide education about proper nutrition, including hydration, and aerobic/resistive exercise prescription along with prescribed medications to achieve blood glucose in normal ranges: Fasting glucose 65-99 mg/dL    Expected Outcomes Short Term: Participant verbalizes understanding of the signs/symptoms and immediate care of hyper/hypoglycemia, proper foot care and importance of medication, aerobic/resistive exercise and nutrition plan for blood glucose control.;Long Term: Attainment of HbA1C < 7%.    Hypertension Yes    Intervention Provide education on lifestyle modifcations including regular physical activity/exercise, weight management, moderate sodium restriction and increased consumption of fresh fruit, vegetables, and low fat dairy, alcohol moderation, and smoking cessation.;Monitor prescription use compliance.    Expected Outcomes Short Term: Continued assessment and intervention until BP is < 140/36mm HG in hypertensive participants. < 130/54mm HG in hypertensive  participants with diabetes, heart failure or chronic kidney disease.;Long Term: Maintenance of blood pressure at goal levels.    Lipids Yes    Intervention Provide education and support for participant on nutrition & aerobic/resistive exercise along with prescribed medications to achieve LDL 70mg , HDL >40mg .    Expected Outcomes Short Term: Participant states understanding of desired cholesterol values and is compliant with medications prescribed. Participant is following exercise prescription and nutrition guidelines.;Long Term: Cholesterol controlled with medications as prescribed, with individualized exercise RX and with personalized nutrition plan. Value goals: LDL < 70mg , HDL > 40 mg.    Personal Goal Other Yes           Education:Diabetes - Individual verbal and written instruction to review signs/symptoms of diabetes, desired ranges of glucose level fasting, after meals and  with exercise. Acknowledge that pre and post exercise glucose checks will be done for 3 sessions at entry of program. Flowsheet Row Cardiac Rehab from 09/02/2018 in [facility]  Date 05/01/18  Educator [facility]  Instruction Review Code 1- Verbalizes Understanding      Know Your Numbers and Heart Failure: - Group verbal and visual instruction to discuss disease risk factors for cardiac and pulmonary disease and treatment options.  Reviews associated critical values for Overweight/Obesity, Hypertension, Cholesterol, and Diabetes.  Discusses basics of heart failure: signs/symptoms and treatments.  Introduces Heart Failure Zone chart for action plan for heart failure.  Written material given at graduation. Flowsheet Row Cardiac Rehab from 09/02/2018 in [facility] Card[facility] Re[facility]  Date 08/07/18  Educator CE  Instruction Review Code 1- Verbalizes Understanding      Core Components/Risk Factors/Patient Goals Review:   Goals and Risk Factor Review    Row Name 10/07/20 1116 11/02/20 1120 11/30/20  1121         Core Components/Risk Factors/Patient Goals Review   Personal Goals Review Improve shortness of breath with ADL's Improve shortness of breath with ADL's;Diabetes Improve shortness of breath with ADL's;Diabetes;Weight Management/Obesity;Hypertension     Review If he is doing chores at home and if things are too strennuous he get anxious and short of breath.Spoke to patient about their shortness of breath and what they can do to improve. Patient has been informed of breathing techniques when starting the program. Patient is informed to tell staff if they have had any med changes and that certain meds they are taking or not taking can be causing shortness of breath. This weekend BG got up to 400 after syrup and pineapple juice - took 2 metformin and it went down to 40. Feeling better now. BG in am usually 125. Educated on hypo and hyperglycemia - how to identify and how to normalize BG. He reports doing better with ADLs at home - will get worse if he moves too quickly or carries things that are too heavy. Sammie is officially two years out from qutting smoking.  His breathing is getting better.  His sugars have been doing well overall.  His pressures continue to do well for him.     Expected Outcomes Short: Attend [facility] regularly to improve shortness of breath with ADL's. Long: maintain independence with ADL's Short: Attend [facility] regularly to improve shortness of breath with ADL's, use O2 as prescribed, continue with management of BG (limiting simple sugars) Long: maintain independence with ADL's Short: Continue to work on breathing  Long: Continue to manage risk factors.            Core Components/Risk Factors/Patient Goals at Discharge (Final Review):   Goals and Risk Factor Review - 11/30/20 1121      Core Components/Risk Factors/Patient Goals Review   Personal Goals Review Improve shortness of breath with ADL's;Diabetes;Weight Management/Obesity;Hypertension    Review Skipper is  officially two years out from qutting smoking.  His breathing is getting better.  His sugars have been doing well overall.  His pressures continue to do well for him.    Expected Outcomes Short: Continue to work on breathing  Long: Continue to manage risk factors.           ITP Comments:  ITP Comments    Row Name 09/12/20 1129 09/13/20 1438 09/19/20 1125 09/21/20 0618 10/19/20 0717   ITP Comments Virtual orientation call completed today. he has an appointment on Date: [id number] for EP  eval and gym Orientation.  Documentation of diagnosis can be found in [facility] Date: 04/14/2020. Completed and gym orientation. Initial ITP created and sent for review to Dr. Bethann Punches, Medical Director. First full day of exercise!  Patient was oriented to gym and equipment including functions, settings, policies, and procedures.  Patient's individual exercise prescription and treatment plan were reviewed.  All starting workloads were established based on the results of the 6 minute walk test done at initial orientation visit.  The plan for exercise progression was also introduced and progression will be customized based on patient's performance and goals. 30 Day review completed. Medical Director ITP review done, changes made as directed, and signed approval by Medical Director. 30 Day review completed. Medical Director ITP review done, changes made as directed, and signed approval by Medical Director.   Row Name 11/16/20 1043 12/14/20 0632         ITP Comments 30 Day review completed. Medical Director ITP review done, changes made as directed, and signed approval by Medical Director. 30 Day review completed. Medical Director ITP review done, changes made as directed, and signed approval by Medical Director.             Comments:

## 2020-12-18 ENCOUNTER — Other Ambulatory Visit: Payer: Self-pay | Admitting: Cardiovascular Disease

## 2020-12-19 ENCOUNTER — Encounter: Payer: Medicare Other | Attending: Pulmonary Disease

## 2020-12-19 DIAGNOSIS — J449 Chronic obstructive pulmonary disease, unspecified: Secondary | ICD-10-CM | POA: Insufficient documentation

## 2020-12-21 ENCOUNTER — Other Ambulatory Visit: Payer: Self-pay

## 2020-12-21 DIAGNOSIS — J449 Chronic obstructive pulmonary disease, unspecified: Secondary | ICD-10-CM

## 2020-12-21 NOTE — Progress Notes (Signed)
Daily Session Note  Patient Details  Name: Harold Parker MRN: [id number] Date of Birth: Aug 15, 1946 Referring Provider:   Flowsheet Row Pulmonary Rehab from 09/13/2020 in [facility] Cardiac and Pulmonary Rehab  Referring Provider Patsey Berthold      Encounter Date: 12/21/2020  Check In:  Session Check In - 12/21/20 1113      Check-In   Supervising physician immediately available to respond to emergencies See telemetry face sheet for immediately available ER MD    Location [facility]-Cardiac & Pulmonary Rehab    Staff Present Birdie Sons, MPA, RN;Joseph Darrin Nipper, [place], RCEP, CCRP, CCET;Melissa Neville RDN, LDN    Virtual Visit No    Medication changes reported     No    Fall or balance concerns reported    No    Warm-up and Cool-down Performed on first and last piece of equipment    Resistance Training Performed Yes    VAD Patient? No    PAD/SET Patient? No      Pain Assessment   Currently in Pain? No/denies              Social History   Tobacco Use  Smoking Status Former Smoker  . Packs/day: 1.00  . Years: 40.00  . Pack years: 40.00  . Types: Cigarettes  . Quit date: 02/02/2018  . Years since quitting: 2.8  Smokeless Tobacco Never Used  Tobacco Comment   Quit 01/2018 - on 05/01/2018 talked to Loyal about Relaspe concerns. He ststaes that he has no taste for tobacco since he quit in Feb.    Goals Met:  Independence with exercise equipment Exercise tolerated well No report of cardiac concerns or symptoms Strength training completed today  Goals Unmet:  Not Applicable  Comments: Pt able to follow exercise prescription today without complaint.  Will continue to monitor for progression.    Dr. Emily Filbert is Medical Director for [place] and [facility].

## 2020-12-26 ENCOUNTER — Encounter: Payer: Medicare Other | Admitting: *Deleted

## 2020-12-26 ENCOUNTER — Other Ambulatory Visit: Payer: Self-pay

## 2020-12-26 DIAGNOSIS — J449 Chronic obstructive pulmonary disease, unspecified: Secondary | ICD-10-CM

## 2020-12-26 NOTE — Progress Notes (Signed)
Daily Session Note  Patient Details  Name: Dimitrios Balestrieri MRN: [id number] Date of Birth: [birth year] Referring Provider:   Flowsheet Row Pulmonary Rehab from 09/13/2020 in [facility] Cardiac and Pulmonary Rehab  Referring Provider Patsey Berthold      Encounter Date: 12/26/2020  Check In:  Session Check In - 12/26/20 1134      Check-In   Supervising physician immediately available to respond to emergencies See telemetry face sheet for immediately available ER MD    Location [facility]-Cardiac & Pulmonary Rehab    Staff Present Heath Lark, RN, BSN, Jacklynn Bue, MS Exercise Physiologist;Joseph Tedd Sias, [place], ACSM CEP, Exercise Physiologist    Virtual Visit No    Medication changes reported     No    Fall or balance concerns reported    No    Warm-up and Cool-down Performed on first and last piece of equipment    Resistance Training Performed Yes    VAD Patient? No    PAD/SET Patient? No      Pain Assessment   Currently in Pain? No/denies              Social History   Tobacco Use  Smoking Status Former Smoker  . Packs/day: 1.00  . Years: 40.00  . Pack years: 40.00  . Types: Cigarettes  . Quit date: 02/02/2018  . Years since quitting: 2.8  Smokeless Tobacco Never Used  Tobacco Comment   Quit 01/2018 - on 05/01/2018 talked to Salam about Relaspe concerns. He ststaes that he has no taste for tobacco since he quit in Feb.    Goals Met:  Proper associated with RPD/PD & O2 Sat Independence with exercise equipment Exercise tolerated well No report of cardiac concerns or symptoms  Goals Unmet:  Not Applicable  Comments: Pt able to follow exercise prescription today without complaint.  Will continue to monitor for progression.    Dr. Emily Filbert is Medical Director for [place] and [facility].

## 2020-12-28 ENCOUNTER — Other Ambulatory Visit: Payer: Self-pay

## 2020-12-28 DIAGNOSIS — J449 Chronic obstructive pulmonary disease, unspecified: Secondary | ICD-10-CM | POA: Diagnosis not present

## 2020-12-28 NOTE — Progress Notes (Signed)
Daily Session Note  Patient Details  Name: Harold Parker MRN: [id number] Date of Birth: 23-Jun-1946 Referring Provider:   Flowsheet Row Pulmonary Rehab from 09/13/2020 in [facility] Cardiac and Pulmonary Rehab  Referring Provider Patsey Berthold      Encounter Date: 12/28/2020  Check In:  Session Check In - 12/28/20 1115      Check-In   Supervising physician immediately available to respond to emergencies See telemetry face sheet for immediately available ER MD    Location [facility]-Cardiac & Pulmonary Rehab    Staff Present Birdie Sons, MPA, Elveria Rising, BA, ACSM CEP, Exercise Physiologist;Kara Eliezer Bottom, MS Exercise Physiologist    Virtual Visit No    Medication changes reported     No    Fall or balance concerns reported    No    Warm-up and Cool-down Performed on first and last piece of equipment    Resistance Training Performed Yes    VAD Patient? No    PAD/SET Patient? No      Pain Assessment   Currently in Pain? No/denies              Social History   Tobacco Use  Smoking Status Former Smoker  . Packs/day: 1.00  . Years: 40.00  . Pack years: 40.00  . Types: Cigarettes  . Quit date: 02/02/2018  . Years since quitting: 2.9  Smokeless Tobacco Never Used  Tobacco Comment   Quit 01/2018 - on 05/01/2018 talked to Wilhelm about Relaspe concerns. He ststaes that he has no taste for tobacco since he quit in Feb.    Goals Met:  Independence with exercise equipment Exercise tolerated well No report of cardiac concerns or symptoms Strength training completed today  Goals Unmet:  Not Applicable  Comments: Pt able to follow exercise prescription today without complaint.  Will continue to monitor for progression.    Dr. Emily Filbert is Medical Director for [place] and [facility].

## 2021-01-04 ENCOUNTER — Telehealth: Payer: Self-pay | Admitting: Pulmonary Disease

## 2021-01-04 NOTE — Telephone Encounter (Signed)
Called and spoke to patient.  Patient reports of increased sob with exertion, bilateral ear pain, temp of 101, productive cough white sputum and chest discomfort with coughing x4-5d. He has had 2 covid vaccines. He has not had booster.  Patient stated his spouse had a cold last week.  He is using albuterol BID, Breztri BID and Duoneb 2-3x daily.  I recommended covid tested. Provided him with web site to register for appt.   Dr. Jayme Cloud, please advise. Thanks

## 2021-01-04 NOTE — Telephone Encounter (Signed)
Definitely needs to get COVID tested.  Recommend urgent care or ED.

## 2021-01-04 NOTE — Telephone Encounter (Signed)
Patient is aware of recommendations. He voiced his understanding and had no further questions.  Nothing further needed.

## 2021-01-11 ENCOUNTER — Other Ambulatory Visit: Payer: Self-pay

## 2021-01-11 ENCOUNTER — Emergency Department: Payer: Medicare Other

## 2021-01-11 ENCOUNTER — Encounter: Payer: Self-pay | Admitting: *Deleted

## 2021-01-11 ENCOUNTER — Inpatient Hospital Stay
Admission: EM | Admit: 2021-01-11 | Discharge: 2021-01-24 | DRG: 177 | Disposition: A | Discharge status: Home / Self Care | Payer: Medicare Other | Attending: Family Medicine | Admitting: Family Medicine

## 2021-01-11 DIAGNOSIS — J449 Chronic obstructive pulmonary disease, unspecified: Secondary | ICD-10-CM | POA: Diagnosis present

## 2021-01-11 DIAGNOSIS — Z888 Allergy status to other drugs, medicaments and biological substances status: Secondary | ICD-10-CM

## 2021-01-11 DIAGNOSIS — I5042 Chronic combined systolic (congestive) and diastolic (congestive) heart failure: Secondary | ICD-10-CM | POA: Diagnosis present

## 2021-01-11 DIAGNOSIS — F410 Panic disorder [episodic paroxysmal anxiety] without agoraphobia: Secondary | ICD-10-CM | POA: Diagnosis present

## 2021-01-11 DIAGNOSIS — Z951 Presence of aortocoronary bypass graft: Secondary | ICD-10-CM | POA: Diagnosis not present

## 2021-01-11 DIAGNOSIS — E875 Hyperkalemia: Secondary | ICD-10-CM | POA: Diagnosis present

## 2021-01-11 DIAGNOSIS — I10 Essential (primary) hypertension: Secondary | ICD-10-CM | POA: Diagnosis present

## 2021-01-11 DIAGNOSIS — Z7984 Long term (current) use of oral hypoglycemic drugs: Secondary | ICD-10-CM | POA: Diagnosis not present

## 2021-01-11 DIAGNOSIS — R778 Other specified abnormalities of plasma proteins: Secondary | ICD-10-CM

## 2021-01-11 DIAGNOSIS — R1310 Dysphagia, unspecified: Secondary | ICD-10-CM | POA: Diagnosis present

## 2021-01-11 DIAGNOSIS — R519 Headache, unspecified: Secondary | ICD-10-CM | POA: Diagnosis not present

## 2021-01-11 DIAGNOSIS — J069 Acute upper respiratory infection, unspecified: Secondary | ICD-10-CM

## 2021-01-11 DIAGNOSIS — T380X5A Adverse effect of glucocorticoids and synthetic analogues, initial encounter: Secondary | ICD-10-CM | POA: Diagnosis not present

## 2021-01-11 DIAGNOSIS — Z8249 Family history of ischemic heart disease and other diseases of the circulatory system: Secondary | ICD-10-CM

## 2021-01-11 DIAGNOSIS — E1129 Type 2 diabetes mellitus with other diabetic kidney complication: Secondary | ICD-10-CM | POA: Diagnosis present

## 2021-01-11 DIAGNOSIS — E785 Hyperlipidemia, unspecified: Secondary | ICD-10-CM

## 2021-01-11 DIAGNOSIS — I130 Hypertensive heart and chronic kidney disease with heart failure and stage 1 through stage 4 chronic kidney disease, or unspecified chronic kidney disease: Secondary | ICD-10-CM | POA: Diagnosis present

## 2021-01-11 DIAGNOSIS — J9621 Acute and chronic respiratory failure with hypoxia: Secondary | ICD-10-CM | POA: Diagnosis present

## 2021-01-11 DIAGNOSIS — K219 Gastro-esophageal reflux disease without esophagitis: Secondary | ICD-10-CM | POA: Diagnosis present

## 2021-01-11 DIAGNOSIS — Z7951 Long term (current) use of inhaled steroids: Secondary | ICD-10-CM

## 2021-01-11 DIAGNOSIS — R7401 Elevation of levels of liver transaminase levels: Secondary | ICD-10-CM | POA: Diagnosis not present

## 2021-01-11 DIAGNOSIS — N182 Chronic kidney disease, stage 2 (mild): Secondary | ICD-10-CM | POA: Diagnosis present

## 2021-01-11 DIAGNOSIS — Z955 Presence of coronary angioplasty implant and graft: Secondary | ICD-10-CM

## 2021-01-11 DIAGNOSIS — T50995A Adverse effect of other drugs, medicaments and biological substances, initial encounter: Secondary | ICD-10-CM | POA: Diagnosis not present

## 2021-01-11 DIAGNOSIS — I255 Ischemic cardiomyopathy: Secondary | ICD-10-CM | POA: Diagnosis present

## 2021-01-11 DIAGNOSIS — Z87891 Personal history of nicotine dependence: Secondary | ICD-10-CM

## 2021-01-11 DIAGNOSIS — Z7982 Long term (current) use of aspirin: Secondary | ICD-10-CM | POA: Diagnosis not present

## 2021-01-11 DIAGNOSIS — I248 Other forms of acute ischemic heart disease: Secondary | ICD-10-CM | POA: Diagnosis present

## 2021-01-11 DIAGNOSIS — Z79899 Other long term (current) drug therapy: Secondary | ICD-10-CM | POA: Diagnosis not present

## 2021-01-11 DIAGNOSIS — Z9981 Dependence on supplemental oxygen: Secondary | ICD-10-CM

## 2021-01-11 DIAGNOSIS — I152 Hypertension secondary to endocrine disorders: Secondary | ICD-10-CM | POA: Diagnosis present

## 2021-01-11 DIAGNOSIS — R0602 Shortness of breath: Secondary | ICD-10-CM | POA: Diagnosis present

## 2021-01-11 DIAGNOSIS — N179 Acute kidney failure, unspecified: Secondary | ICD-10-CM | POA: Diagnosis present

## 2021-01-11 DIAGNOSIS — J441 Chronic obstructive pulmonary disease with (acute) exacerbation: Secondary | ICD-10-CM | POA: Diagnosis present

## 2021-01-11 DIAGNOSIS — Z953 Presence of xenogenic heart valve: Secondary | ICD-10-CM

## 2021-01-11 DIAGNOSIS — J440 Chronic obstructive pulmonary disease with acute lower respiratory infection: Secondary | ICD-10-CM | POA: Diagnosis present

## 2021-01-11 DIAGNOSIS — J1282 Pneumonia due to coronavirus disease 2019: Secondary | ICD-10-CM | POA: Diagnosis present

## 2021-01-11 DIAGNOSIS — I5022 Chronic systolic (congestive) heart failure: Secondary | ICD-10-CM | POA: Diagnosis present

## 2021-01-11 DIAGNOSIS — I2510 Atherosclerotic heart disease of native coronary artery without angina pectoris: Secondary | ICD-10-CM | POA: Diagnosis present

## 2021-01-11 DIAGNOSIS — U071 COVID-19: Secondary | ICD-10-CM | POA: Diagnosis present

## 2021-01-11 DIAGNOSIS — N289 Disorder of kidney and ureter, unspecified: Secondary | ICD-10-CM | POA: Diagnosis present

## 2021-01-11 DIAGNOSIS — E1169 Type 2 diabetes mellitus with other specified complication: Secondary | ICD-10-CM | POA: Diagnosis present

## 2021-01-11 DIAGNOSIS — E7800 Pure hypercholesterolemia, unspecified: Secondary | ICD-10-CM | POA: Diagnosis present

## 2021-01-11 DIAGNOSIS — F419 Anxiety disorder, unspecified: Secondary | ICD-10-CM | POA: Diagnosis not present

## 2021-01-11 DIAGNOSIS — E1122 Type 2 diabetes mellitus with diabetic chronic kidney disease: Secondary | ICD-10-CM | POA: Diagnosis present

## 2021-01-11 DIAGNOSIS — I5023 Acute on chronic systolic (congestive) heart failure: Secondary | ICD-10-CM | POA: Diagnosis present

## 2021-01-11 DIAGNOSIS — J9601 Acute respiratory failure with hypoxia: Secondary | ICD-10-CM

## 2021-01-11 DIAGNOSIS — Z794 Long term (current) use of insulin: Secondary | ICD-10-CM | POA: Diagnosis present

## 2021-01-11 DIAGNOSIS — I252 Old myocardial infarction: Secondary | ICD-10-CM

## 2021-01-11 DIAGNOSIS — R0902 Hypoxemia: Secondary | ICD-10-CM

## 2021-01-11 DIAGNOSIS — E119 Type 2 diabetes mellitus without complications: Secondary | ICD-10-CM | POA: Diagnosis present

## 2021-01-11 DIAGNOSIS — E1165 Type 2 diabetes mellitus with hyperglycemia: Secondary | ICD-10-CM | POA: Diagnosis present

## 2021-01-11 LAB — PROCALCITONIN: Procalcitonin: 0.24 ng/mL

## 2021-01-11 LAB — TROPONIN I (HIGH SENSITIVITY)
Troponin I (High Sensitivity): 21 ng/L — ABNORMAL HIGH (ref <18)
Troponin I (High Sensitivity): 30 ng/L — ABNORMAL HIGH (ref <18)
Troponin I (High Sensitivity): 67 ng/L — ABNORMAL HIGH (ref <18)

## 2021-01-11 LAB — COMPREHENSIVE METABOLIC PANEL
ALT: 35 U/L (ref 0–44)
AST: 29 U/L (ref 15–41)
Albumin: 4.1 g/dL (ref 3.5–5.0)
Alkaline Phosphatase: 74 U/L (ref 38–126)
Anion gap: 11 (ref 5–15)
BUN: 43 mg/dL — ABNORMAL HIGH (ref 8–23)
CO2: 20 mmol/L — ABNORMAL LOW (ref 22–32)
Calcium: 9.1 mg/dL (ref 8.9–10.3)
Chloride: 104 mmol/L (ref 98–111)
Creatinine, Ser: 1.64 mg/dL — ABNORMAL HIGH (ref 0.61–1.24)
GFR, Estimated: 43 mL/min — ABNORMAL LOW (ref >60)
Glucose, Bld: 170 mg/dL — ABNORMAL HIGH (ref 70–99)
Potassium: 5.6 mmol/L — ABNORMAL HIGH (ref 3.5–5.1)
Sodium: 135 mmol/L (ref 135–145)
Total Bilirubin: 0.9 mg/dL (ref 0.3–1.2)
Total Protein: 7.5 g/dL (ref 6.5–8.1)

## 2021-01-11 LAB — CBC WITH DIFFERENTIAL/PLATELET
Abs Immature Granulocytes: 0.04 10*3/uL (ref 0.00–0.07)
Basophils Absolute: 0 10*3/uL (ref 0.0–0.1)
Basophils Relative: 1 %
Eosinophils Absolute: 0 10*3/uL (ref 0.0–0.5)
Eosinophils Relative: 0 %
HCT: 49.6 % (ref 39.0–52.0)
Hemoglobin: 16.5 g/dL (ref 13.0–17.0)
Immature Granulocytes: 1 %
Lymphocytes Relative: 20 %
Lymphs Abs: 1.6 10*3/uL (ref 0.7–4.0)
MCH: 30.5 pg (ref 26.0–34.0)
MCHC: 33.3 g/dL (ref 30.0–36.0)
MCV: 91.7 fL (ref 80.0–100.0)
Monocytes Absolute: 0.8 10*3/uL (ref 0.1–1.0)
Monocytes Relative: 10 %
Neutro Abs: 5.7 10*3/uL (ref 1.7–7.7)
Neutrophils Relative %: 68 %
Platelets: 160 10*3/uL (ref 150–400)
RBC: 5.41 MIL/uL (ref 4.22–5.81)
RDW: 14.2 % (ref 11.5–15.5)
WBC: 8.2 10*3/uL (ref 4.0–10.5)
nRBC: 0 % (ref 0.0–0.2)

## 2021-01-11 LAB — CBG MONITORING, ED
Glucose-Capillary: 181 mg/dL — ABNORMAL HIGH (ref 70–99)
Glucose-Capillary: 300 mg/dL — ABNORMAL HIGH (ref 70–99)
Glucose-Capillary: 444 mg/dL — ABNORMAL HIGH (ref 70–99)

## 2021-01-11 LAB — POC SARS CORONAVIRUS 2 AG -  ED: SARS Coronavirus 2 Ag: POSITIVE — AB

## 2021-01-11 LAB — LACTATE DEHYDROGENASE: LDH: 214 U/L — ABNORMAL HIGH (ref 98–192)

## 2021-01-11 LAB — TRIGLYCERIDES: Triglycerides: 55 mg/dL (ref <150)

## 2021-01-11 LAB — HEPATITIS B SURFACE ANTIGEN: Hepatitis B Surface Ag: NONREACTIVE

## 2021-01-11 LAB — EXPECTORATED SPUTUM ASSESSMENT W GRAM STAIN, RFLX TO RESP C

## 2021-01-11 LAB — GLUCOSE, CAPILLARY: Glucose-Capillary: 193 mg/dL — ABNORMAL HIGH (ref 70–99)

## 2021-01-11 LAB — C-REACTIVE PROTEIN: CRP: 6 mg/dL — ABNORMAL HIGH (ref <1.0)

## 2021-01-11 LAB — FERRITIN: Ferritin: 171 ng/mL (ref 24–336)

## 2021-01-11 LAB — BRAIN NATRIURETIC PEPTIDE: B Natriuretic Peptide: 181.6 pg/mL — ABNORMAL HIGH (ref 0.0–100.0)

## 2021-01-11 LAB — FIBRINOGEN: Fibrinogen: 518 mg/dL — ABNORMAL HIGH (ref 210–475)

## 2021-01-11 LAB — FIBRIN DERIVATIVES D-DIMER (ARMC ONLY): Fibrin derivatives D-dimer (ARMC): 607.43 ng/mL (FEU) — ABNORMAL HIGH (ref 0.00–499.00)

## 2021-01-11 MED ORDER — INSULIN ASPART 100 UNIT/ML ~~LOC~~ SOLN
0.0000 [IU] | Freq: Three times a day (TID) | SUBCUTANEOUS | Status: DC
  Administered 2021-01-11: 5 [IU] via SUBCUTANEOUS
  Administered 2021-01-11: 9 [IU] via SUBCUTANEOUS
  Administered 2021-01-12 (×2): 5 [IU] via SUBCUTANEOUS
  Administered 2021-01-12: 12:00:00 2 [IU] via SUBCUTANEOUS
  Administered 2021-01-13: 3 [IU] via SUBCUTANEOUS
  Administered 2021-01-13: 13:00:00 5 [IU] via SUBCUTANEOUS
  Administered 2021-01-13: 09:00:00 3 [IU] via SUBCUTANEOUS
  Administered 2021-01-14 (×2): 5 [IU] via SUBCUTANEOUS
  Administered 2021-01-14: 7 [IU] via SUBCUTANEOUS
  Administered 2021-01-15 (×2): 5 [IU] via SUBCUTANEOUS
  Administered 2021-01-15: 13:00:00 7 [IU] via SUBCUTANEOUS
  Administered 2021-01-16 (×2): 5 [IU] via SUBCUTANEOUS
  Administered 2021-01-16: 3 [IU] via SUBCUTANEOUS
  Filled 2021-01-11 (×16): qty 1

## 2021-01-11 MED ORDER — ASCORBIC ACID 500 MG PO TABS
500.0000 mg | ORAL_TABLET | Freq: Every day | ORAL | Status: DC
  Administered 2021-01-11 – 2021-01-24 (×14): 500 mg via ORAL
  Filled 2021-01-11 (×14): qty 1

## 2021-01-11 MED ORDER — VITAMIN D3 25 MCG (1000 UNIT) PO TABS
1000.0000 [IU] | ORAL_TABLET | ORAL | Status: DC
  Administered 2021-01-12 – 2021-01-24 (×14): 1000 [IU] via ORAL
  Filled 2021-01-11 (×28): qty 1

## 2021-01-11 MED ORDER — INSULIN GLARGINE 100 UNIT/ML ~~LOC~~ SOLN
8.0000 [IU] | Freq: Every day | SUBCUTANEOUS | Status: DC
  Administered 2021-01-11: 8 [IU] via SUBCUTANEOUS
  Filled 2021-01-11 (×2): qty 0.08

## 2021-01-11 MED ORDER — DEXTROSE 50 % IV SOLN
50.0000 mL | Freq: Once | INTRAVENOUS | Status: AC
  Administered 2021-01-11: 50 mL via INTRAVENOUS
  Filled 2021-01-11: qty 50

## 2021-01-11 MED ORDER — EZETIMIBE 10 MG PO TABS
10.0000 mg | ORAL_TABLET | Freq: Every day | ORAL | Status: DC
  Administered 2021-01-12 – 2021-01-23 (×12): 10 mg via ORAL
  Filled 2021-01-11 (×16): qty 1

## 2021-01-11 MED ORDER — ASCORBIC ACID 500 MG PO TABS: 1000.0000 mg | ORAL_TABLET | Freq: Every day | ORAL | Status: DC

## 2021-01-11 MED ORDER — METOPROLOL SUCCINATE ER 50 MG PO TB24
100.0000 mg | ORAL_TABLET | Freq: Every day | ORAL | Status: DC
  Administered 2021-01-11 – 2021-01-24 (×14): 100 mg via ORAL
  Filled 2021-01-11 (×14): qty 2

## 2021-01-11 MED ORDER — ARFORMOTEROL TARTRATE 15 MCG/2ML IN NEBU
15.0000 ug | INHALATION_SOLUTION | Freq: Two times a day (BID) | RESPIRATORY_TRACT | Status: DC
  Administered 2021-01-11 – 2021-01-24 (×25): 15 ug via RESPIRATORY_TRACT
  Filled 2021-01-11 (×29): qty 2

## 2021-01-11 MED ORDER — LOPERAMIDE HCL 2 MG PO CAPS
2.0000 mg | ORAL_CAPSULE | Freq: Two times a day (BID) | ORAL | Status: DC | PRN
  Administered 2021-01-13: 09:00:00 2 mg via ORAL
  Filled 2021-01-11: qty 1

## 2021-01-11 MED ORDER — ONDANSETRON HCL 4 MG PO TABS: 4.0000 mg | ORAL_TABLET | Freq: Four times a day (QID) | ORAL | Status: DC | PRN

## 2021-01-11 MED ORDER — ALBUTEROL SULFATE HFA 108 (90 BASE) MCG/ACT IN AERS
2.0000 | INHALATION_SPRAY | RESPIRATORY_TRACT | Status: DC | PRN
  Administered 2021-01-21 – 2021-01-24 (×3): 2 via RESPIRATORY_TRACT
  Filled 2021-01-11 (×2): qty 6.7

## 2021-01-11 MED ORDER — SODIUM CHLORIDE 0.9 % IV SOLN
200.0000 mg | Freq: Once | INTRAVENOUS | Status: AC
  Administered 2021-01-11: 200 mg via INTRAVENOUS
  Filled 2021-01-11: qty 200

## 2021-01-11 MED ORDER — ALPRAZOLAM 0.5 MG PO TABS
0.5000 mg | ORAL_TABLET | Freq: Three times a day (TID) | ORAL | Status: DC | PRN
  Administered 2021-01-11 – 2021-01-13 (×7): 0.5 mg via ORAL
  Filled 2021-01-11 (×8): qty 1

## 2021-01-11 MED ORDER — ASPIRIN EC 81 MG PO TBEC
81.0000 mg | DELAYED_RELEASE_TABLET | Freq: Every day | ORAL | Status: DC
  Administered 2021-01-11 – 2021-01-24 (×14): 81 mg via ORAL
  Filled 2021-01-11 (×14): qty 1

## 2021-01-11 MED ORDER — SODIUM ZIRCONIUM CYCLOSILICATE 5 G PO PACK
10.0000 g | PACK | Freq: Once | ORAL | Status: AC
  Administered 2021-01-11: 10 g via ORAL
  Filled 2021-01-11: qty 2

## 2021-01-11 MED ORDER — AMLODIPINE BESYLATE 5 MG PO TABS
5.0000 mg | ORAL_TABLET | Freq: Every day | ORAL | Status: DC
  Administered 2021-01-11 – 2021-01-24 (×14): 5 mg via ORAL
  Filled 2021-01-11 (×15): qty 1

## 2021-01-11 MED ORDER — INSULIN ASPART 100 UNIT/ML ~~LOC~~ SOLN
0.0000 [IU] | Freq: Every day | SUBCUTANEOUS | Status: DC
  Administered 2021-01-12: 2 [IU] via SUBCUTANEOUS
  Administered 2021-01-13: 3 [IU] via SUBCUTANEOUS
  Administered 2021-01-15: 2 [IU] via SUBCUTANEOUS
  Administered 2021-01-16: 22:00:00 3 [IU] via SUBCUTANEOUS
  Administered 2021-01-18 – 2021-01-20 (×2): 2 [IU] via SUBCUTANEOUS
  Administered 2021-01-22: 3 [IU] via SUBCUTANEOUS
  Administered 2021-01-23: 2 [IU] via SUBCUTANEOUS
  Filled 2021-01-11 (×8): qty 1

## 2021-01-11 MED ORDER — METHYLPREDNISOLONE SODIUM SUCC 40 MG IJ SOLR
40.0000 mg | Freq: Two times a day (BID) | INTRAMUSCULAR | Status: DC
  Administered 2021-01-11 – 2021-01-12 (×3): 40 mg via INTRAVENOUS
  Filled 2021-01-11 (×4): qty 1

## 2021-01-11 MED ORDER — ZINC SULFATE 220 (50 ZN) MG PO CAPS
220.0000 mg | ORAL_CAPSULE | Freq: Every day | ORAL | Status: DC
  Administered 2021-01-11 – 2021-01-24 (×14): 220 mg via ORAL
  Filled 2021-01-11 (×14): qty 1

## 2021-01-11 MED ORDER — ACETAMINOPHEN 325 MG PO TABS
650.0000 mg | ORAL_TABLET | Freq: Four times a day (QID) | ORAL | Status: DC | PRN
  Administered 2021-01-11 – 2021-01-24 (×9): 650 mg via ORAL
  Filled 2021-01-11 (×9): qty 2

## 2021-01-11 MED ORDER — ENOXAPARIN SODIUM 40 MG/0.4ML ~~LOC~~ SOLN
40.0000 mg | SUBCUTANEOUS | Status: DC
  Administered 2021-01-11 – 2021-01-23 (×13): 40 mg via SUBCUTANEOUS
  Filled 2021-01-11 (×13): qty 0.4

## 2021-01-11 MED ORDER — PANTOPRAZOLE SODIUM 40 MG PO TBEC
40.0000 mg | DELAYED_RELEASE_TABLET | Freq: Every day | ORAL | Status: DC
  Administered 2021-01-11 – 2021-01-13 (×3): 40 mg via ORAL
  Filled 2021-01-11 (×3): qty 1

## 2021-01-11 MED ORDER — INSULIN ASPART 100 UNIT/ML IV SOLN
5.0000 [IU] | Freq: Once | INTRAVENOUS | Status: AC
  Administered 2021-01-11: 5 [IU] via INTRAVENOUS
  Filled 2021-01-11: qty 0.05

## 2021-01-11 MED ORDER — DM-GUAIFENESIN ER 30-600 MG PO TB12
1.0000 | ORAL_TABLET | Freq: Two times a day (BID) | ORAL | Status: DC | PRN
  Administered 2021-01-11: 1 via ORAL
  Filled 2021-01-11: qty 1

## 2021-01-11 MED ORDER — HYDRALAZINE HCL 20 MG/ML IJ SOLN: 5.0000 mg | INTRAMUSCULAR | Status: DC | PRN

## 2021-01-11 MED ORDER — FLUTICASONE PROPIONATE HFA 110 MCG/ACT IN AERO
2.0000 | INHALATION_SPRAY | Freq: Two times a day (BID) | RESPIRATORY_TRACT | Status: DC
  Administered 2021-01-11 – 2021-01-24 (×26): 2 via RESPIRATORY_TRACT
  Filled 2021-01-11: qty 12

## 2021-01-11 MED ORDER — IPRATROPIUM BROMIDE HFA 17 MCG/ACT IN AERS
2.0000 | INHALATION_SPRAY | RESPIRATORY_TRACT | Status: DC
  Administered 2021-01-11 – 2021-01-16 (×29): 2 via RESPIRATORY_TRACT
  Filled 2021-01-11 (×2): qty 12.9

## 2021-01-11 MED ORDER — FOLIC ACID 1 MG PO TABS
500.0000 ug | ORAL_TABLET | Freq: Every evening | ORAL | Status: DC
  Administered 2021-01-12 – 2021-01-24 (×13): 0.5 mg via ORAL
  Filled 2021-01-11 (×16): qty 1

## 2021-01-11 MED ORDER — LACTATED RINGERS IV BOLUS
1000.0000 mL | Freq: Once | INTRAVENOUS | Status: AC
  Administered 2021-01-11: 1000 mL via INTRAVENOUS

## 2021-01-11 MED ORDER — IPRATROPIUM-ALBUTEROL 0.5-2.5 (3) MG/3ML IN SOLN
6.0000 mL | Freq: Once | RESPIRATORY_TRACT | Status: AC
  Administered 2021-01-11: 6 mL via RESPIRATORY_TRACT
  Filled 2021-01-11: qty 3

## 2021-01-11 MED ORDER — BUDESON-GLYCOPYRROL-FORMOTEROL 160-9-4.8 MCG/ACT IN AERO: 1.0000 | INHALATION_SPRAY | Freq: Every day | RESPIRATORY_TRACT | Status: DC

## 2021-01-11 MED ORDER — ACETAMINOPHEN 500 MG PO TABS
1000.0000 mg | ORAL_TABLET | Freq: Once | ORAL | Status: AC
  Administered 2021-01-11: 1000 mg via ORAL
  Filled 2021-01-11: qty 2

## 2021-01-11 MED ORDER — SODIUM CHLORIDE 0.9 % IV SOLN
100.0000 mg | Freq: Every day | INTRAVENOUS | Status: AC
  Administered 2021-01-12 – 2021-01-15 (×4): 100 mg via INTRAVENOUS
  Filled 2021-01-11 (×4): qty 20

## 2021-01-11 MED ORDER — MELATONIN 5 MG PO TABS
10.0000 mg | ORAL_TABLET | Freq: Every day | ORAL | Status: DC
  Administered 2021-01-11 – 2021-01-12 (×2): 10 mg via ORAL
  Filled 2021-01-11 (×3): qty 2

## 2021-01-11 NOTE — Progress Notes (Signed)
Pulmonary Individual Treatment Plan  Patient Details  Name: Harold Parker MRN: [id number] Date of Birth: May 02, 1946 Referring Provider:   Flowsheet Row Pulmonary Rehab from 09/13/2020 in [facility] Cardiac and Pulmonary Rehab  Referring Provider Jayme Cloud      Initial Encounter Date:  Flowsheet Row Pulmonary Rehab from 09/13/2020 in [facility] Cardiac and Pulmonary Rehab  Date 09/13/20      Visit Diagnosis: Chronic obstructive pulmonary disease, unspecified COPD type (HCC)  Patient's Home Medications on Admission: No current facility-administered medications for this visit.  Current Outpatient Medications:  .  acetaminophen (TYLENOL) 500 MG tablet, Take 1,000 mg by mouth daily as needed for moderate pain or headache., Disp: , Rfl:  .  ALPRAZolam (XANAX) 0.5 MG tablet, Take 0.5 mg by mouth 2 (two) times daily. , Disp: , Rfl:  .  amLODipine (NORVASC) 5 MG tablet, Take 5 mg by mouth daily., Disp: , Rfl:  .  Ascorbic Acid (VITAMIN C) 1000 MG tablet, Take 1,000 mg by mouth daily., Disp: , Rfl:  .  aspirin EC 81 MG tablet, Take 81 mg by mouth daily., Disp: , Rfl:  .  atorvastatin (LIPITOR) 80 MG tablet, Take 80 mg by mouth daily., Disp: , Rfl:  .  BREZTRI AEROSPHERE 160-9-4.8 MCG/ACT AERO, TAKE 2 PUFFS BY MOUTH TWICE A DAY (STOP TRELEGY), Disp: 32.1 g, Rfl: 3 .  esomeprazole (NEXIUM) 40 MG capsule, Take 40 mg by mouth at bedtime. , Disp: , Rfl:  .  ezetimibe (ZETIA) 10 MG tablet, Take 10 mg by mouth at bedtime. , Disp: , Rfl:  .  folic acid (FOLVITE) 400 MCG tablet, Take 400 mcg by mouth every evening., Disp: , Rfl:  .  furosemide (LASIX) 40 MG tablet, TAKE 1 TABLET BY MOUTH EVERY DAY, Disp: 90 tablet, Rfl: 3 .  glipiZIDE (GLUCOTROL) 5 MG tablet, Take 0.5 tablets (2.5 mg total) by mouth daily before breakfast. (Patient taking differently: Take 5 mg by mouth See admin instructions. Take 5 mg in the morning and take a second 5 mg dose at night on Mon, Wed, and Fri), Disp: 30 tablet, Rfl: 1 .   ipratropium-albuterol (DUONEB) 0.5-2.5 (3) MG/3ML SOLN, Take 3 mLs by nebulization every 6 (six) hours as needed. (Patient taking differently: Take 3 mLs by nebulization every 6 (six) hours as needed (shortness of breath). ), Disp: 360 mL, Rfl: 1 .  losartan (COZAAR) 100 MG tablet, TAKE 1 TABLET BY MOUTH EVERY DAY Please call to schedule appointment for further refills. Thank you! (Patient taking differently: 50 mg. TAKE 1 TABLET BY MOUTH EVERY DAY Please call to schedule appointment for further refills. Thank you!), Disp: 90 tablet, Rfl: 0 .  Melatonin 10 MG CAPS, Take 10 mg by mouth at bedtime., Disp: , Rfl:  .  metFORMIN (GLUCOPHAGE) 1000 MG tablet, TAKE 1 TABLET BY MOUTH TWICE DAILY WITH A MEAL (Patient taking differently: Take 1,000 mg by mouth 2 (two) times daily. ), Disp: 60 tablet, Rfl: 0 .  metoprolol succinate (TOPROL-XL) 100 MG 24 hr tablet, TAKE 1 TABLET BY MOUTH EVERY DAY WITH OR IMMEDIATELY FOLLOWING A MEAL, Disp: 90 tablet, Rfl: 1 .  PROAIR HFA 108 (90 Base) MCG/ACT inhaler, INL 2 PFS PO Q 6 H PRN, Disp: , Rfl:  .  Vitamin D, Cholecalciferol, 1000 units TABS, Take 1,000 Units by mouth every morning. , Disp: , Rfl:   Facility-Administered Medications Ordered in Other Visits:  .  acetaminophen (TYLENOL) tablet 650 mg, 650 mg, Oral, Q6H PRN, Lorretta Harp, MD, [phone]  mg at 01/11/21 0814 .  albuterol (VENTOLIN HFA) 108 (90 Base) MCG/ACT inhaler 2 puff, 2 puff, Inhalation, Q4H PRN, Lorretta Harp, MD .  ALPRAZolam Prudy Feeler) tablet 0.5 mg, 0.5 mg, Oral, TID PRN, Lorretta Harp, MD, 0.5 mg at 01/11/21 0814 .  ascorbic acid (VITAMIN C) tablet 500 mg, 500 mg, Oral, Daily, Lorretta Harp, MD .  dextromethorphan-guaiFENesin (MUCINEX DM) 30-600 MG per 12 hr tablet 1 tablet, 1 tablet, Oral, BID PRN, Lorretta Harp, MD .  hydrALAZINE (APRESOLINE) injection 5 mg, 5 mg, Intravenous, Q2H PRN, Lorretta Harp, MD .  insulin aspart (novoLOG) injection 0-5 Units, 0-5 Units, Subcutaneous, QHS, Niu, Xilin, MD .  insulin aspart  (novoLOG) injection 0-9 Units, 0-9 Units, Subcutaneous, TID WC, Lorretta Harp, MD .  ipratropium (ATROVENT HFA) inhaler 2 puff, 2 puff, Inhalation, Q4H, Lorretta Harp, MD, 2 puff at 01/11/21 0848 .  loperamide (IMODIUM) capsule 2 mg, 2 mg, Oral, BID PRN, Lorretta Harp, MD .  methylPREDNISolone sodium succinate (SOLU-MEDROL) 40 mg/mL injection 40 mg, 40 mg, Intravenous, Q12H, Lorretta Harp, MD, 40 mg at 01/11/21 0815 .  ondansetron (ZOFRAN) tablet 4 mg, 4 mg, Oral, Q6H PRN, Lorretta Harp, MD .  [address] remdesivir 200 mg in sodium chloride 0.9% 250 mL IVPB, 200 mg, Intravenous, Once, Last Rate: 580 mL/hr at 01/11/21 0845, 200 mg at 01/11/21 0845 **FOLLOWED BY** [START ON 01/12/2021] remdesivir 100 mg in sodium chloride 0.9 % 100 mL IVPB, 100 mg, Intravenous, Daily, Delton Prairie, MD .  sodium zirconium cyclosilicate (LOKELMA) packet 10 g, 10 g, Oral, Once, Lorretta Harp, MD .  zinc sulfate capsule 220 mg, 220 mg, Oral, Daily, Lorretta Harp, MD, 220 mg at 01/11/21 7035  Past Medical History: Past Medical History:  Diagnosis Date  . Bilateral carotid bruits    a. 01/2018 U/S: < 50% bilat ICA stenosis.  Marland Kitchen CAD (coronary artery disease)    a. 1998 s/p MI and BMS [facility], [place]); b. 1999 redo PCI/rotablator in setting of what sounds like ISR;  c. Multiple stress tests over the years - last ~ 2017, reportedly nl; d. 01/2018 NSTEMI/Cath: LM 83m/d, LAD 50p, 40p/m, D1 60ost, OM1 95, RCA 100ost/p w/ L->R collats, EF 45%; e. s/p 3V CABG 02/19/18 (LIMA-LAD, VG-D1, VG-OM)  . Chronic lower back pain   . COPD (chronic obstructive pulmonary disease) (HCC)   . GIB (gastrointestinal bleeding)    a. 02/2018 GIB and anemia w/ Hgb of 4.7 on presentation; b. 03/2018 EGD: 2 nonbleeding duodenal ulcers.  Marland Kitchen HTN (hypertension)   . Hypercholesteremia   . Ischemic cardiomyopathy    a. 01/2018 Echo: EF 40-45%, mid-apicalanteroseptal, ant, apical sev HK, mod apicalinferior HK. Gr2 DD. Mod AS, mild MR, mod dil LA, PASP .  . Moderate aortic  stenosis    a. 01/2018 Echo: Mod AS, mean grad (S) , Valve area (VTI) 1.06 cm^2, (Vmax) 1.27 cm^2; b. 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  . Myocardial infarction (HCC) ~ 1998/1999  . 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 02/19/2018 AVR: 25 mm Edwards Inspiris Resilia stented bovine pericardial tissue valve  . 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 Tobacco abuse   . Type II diabetes mellitus (HCC)     Tobacco Use: Social History   Tobacco Use  Smoking Status Former Smoker  . Packs/day: 1.00  . Years: 40.00  . Pack years: 40.00  . Types: Cigarettes  .  Quit date: 02/02/2018  . Years since quitting: 2.9  Smokeless Tobacco Never Used  Tobacco Comment   Quit 01/2018 - on 05/01/2018 talked to Candon about [employer] concerns. He ststaes that he has no taste for tobacco since he quit in Feb.    Labs: Recent Review Flowsheet Data    Labs for ITP Cardiac and Pulmonary Rehab Latest Ref Rng & Units 02/19/2018 02/19/2018 02/20/2018 02/20/2018 03/27/2018   Cholestrol 100 - 199 mg/dL - - - - 782   LDLCALC 0 - 99 mg/dL - - - - 50   HDL >95 mg/dL - - - - 62(Z)   Trlycerides 0 - 149 mg/dL - - - - 84   Hemoglobin A1c 4.8 - 5.6 % - - - - -   PHART 7.350 - 7.450 - 7.335(L) 7.361 7.331(L) -   PCO2ART 32.0 - 48.0 mmHg - 40.2 40.1 41.0 -   HCO3 20.0 - 28.0 mmol/L - 21.2 22.5 21.5 -   TCO2 22 - 32 mmol/L 24 22 24 23  -   ACIDBASEDEF 0.0 - 2.0 mmol/L - 4.0(H) 2.0 4.0(H) -   O2SAT % - 97.0 97.0 95.0 -       Pulmonary Assessment Scores:  Pulmonary Assessment Scores    Row Name 09/13/20 1422         ADL UCSD   SOB Score total 21     Rest 0     Walk 2     Stairs 3     Bath 2     Dress 0     Shop 0           CAT Score   CAT Score 16           mMRC Score   mMRC Score 3            UCSD: Self-administered rating of dyspnea associated with activities of daily living (ADLs) 6-point scale (0 = "not at all" to 5 =  "maximal or unable to do because of breathlessness")  Scoring Scores range from 0 to 120.  Minimally important difference is 5 units  CAT: CAT can identify the health impairment of COPD patients and is better correlated with disease progression.  CAT has a scoring range of zero to 40. The CAT score is classified into four groups of low (less than 10), medium (10 - 20), high (21-30) and very high (31-40) based on the impact level of disease on health status. A CAT score over 10 suggests significant symptoms.  A worsening CAT score could be explained by an exacerbation, poor medication adherence, poor inhaler technique, or progression of COPD or comorbid conditions.  CAT MCID is 2 points  mMRC: mMRC (Modified Medical Research Council) Dyspnea Scale is used to assess the degree of baseline functional disability in patients of respiratory disease due to dyspnea. No minimal important difference is established. A decrease in score of 1 point or greater is considered a positive change.   Pulmonary Function Assessment:   Exercise Target Goals: Exercise Program Goal: Individual exercise prescription set using results from initial 6 min walk test and THRR while considering  patient's activity barriers and safety.   Exercise Prescription Goal: Initial exercise prescription builds to 30-45 minutes a day of aerobic activity, 2-3 days per week.  Home exercise guidelines will be given to patient during program as part of exercise prescription that the participant will acknowledge.  Education: Aerobic Exercise: - Group verbal and visual presentation on the components of exercise  prescription. Introduces F.I.T.T principle from ACSM for exercise prescriptions.  Reviews F.I.T.T. principles of aerobic exercise including progression. Written material given at graduation. Flowsheet Row Cardiac Rehab from 09/02/2018 in [facility] Cardiac and Pulmonary Rehab  Date 07/24/18  Educator AS  Instruction Review Code 1-  Verbalizes Understanding      Education: Resistance Exercise: - Group verbal and visual presentation on the components of exercise prescription. Introduces F.I.T.T principle from ACSM for exercise prescriptions  Reviews F.I.T.T. principles of resistance exercise including progression. Written material given at graduation.    Education: Exercise & Equipment Safety: - Individual verbal instruction and demonstration of equipment use and safety with use of the equipment. Flowsheet Row Pulmonary Rehab from 09/21/2020 in [facility] Pulmonary Rehab  Date 09/13/20  Educator AS  Instruction Review Code 1- Verbalizes Understanding      Education: Exercise Physiology & General Exercise Guidelines: - Group verbal and written instruction with models to review the exercise physiology of the cardiovascular system and associated critical values. Provides general exercise guidelines with specific guidelines to those with heart or lung disease.  Flowsheet Row Cardiac Rehab from 09/02/2018 in [facility] Cardiac and Pulmonary Rehab  Date 05/20/18  Educator AS  Instruction Review Code 4- No Evidence of Learning  [charting in error]      Education: Flexibility, Balance, Mind/Body Relaxation: - Group verbal and visual presentation with interactive activity on the components of exercise prescription. Introduces F.I.T.T principle from ACSM for exercise prescriptions. Reviews F.I.T.T. principles of flexibility and balance exercise training including progression. Also discusses the mind body connection.  Reviews various relaxation techniques to help reduce and manage stress (i.e. Deep breathing, progressive muscle relaxation, and visualization). Balance handout provided to take home. Written material given at graduation. Flowsheet Row Cardiac Rehab from 09/02/2018 in [facility] Cardiac and Pulmonary Rehab  Date 07/29/18  Educator AS  Instruction Review Code 1- Verbalizes Understanding      Activity Barriers & Risk  Stratification:  Activity Barriers & Cardiac Risk Stratification - 09/12/20 1113      Activity Barriers & Cardiac Risk Stratification   Activity Barriers Shortness of Breath           6 Minute Walk:  6 Minute Walk    Row Name 09/13/20 1415         6 Minute Walk   Phase Initial     Distance 815 feet     Walk Time 5.5 minutes     # of Rest Breaks 3     MPH 1.7     METS 1.8     RPE 16     Perceived Dyspnea  3     VO2 Peak 6.2     Symptoms Yes (comment)     Comments SOB     Resting HR 78 bpm     Resting BP 140/74     Resting Oxygen Saturation  91 %     Exercise Oxygen Saturation  during 6 min walk 84 %     Max Ex. HR 104 bpm     Max Ex. BP 158/64     2 Minute Post BP 138/76           Interval HR   1 Minute HR 85     2 Minute HR 99     3 Minute HR 101     4 Minute HR 104     5 Minute HR 101     6 Minute HR 103     2  Minute Post HR 96     Interval Heart Rate? Yes           Interval Oxygen   Interval Oxygen? Yes     Baseline Oxygen Saturation % 91 %     1 Minute Oxygen Saturation % 85 %     1 Minute Liters of Oxygen 2 L     2 Minute Oxygen Saturation % 84 %     2 Minute Liters of Oxygen 2 L     3 Minute Oxygen Saturation % 84 %     3 Minute Liters of Oxygen 2 L     4 Minute Oxygen Saturation % 85 %     4 Minute Liters of Oxygen 2 L     5 Minute Oxygen Saturation % 85 %     5 Minute Liters of Oxygen 2 L     6 Minute Oxygen Saturation % 86 %     6 Minute Liters of Oxygen 2 L     2 Minute Post Oxygen Saturation % 85 %     2 Minute Post Liters of Oxygen 2 L           Oxygen Initial Assessment:  Oxygen Initial Assessment - 09/12/20 1111      Home Oxygen   Home Oxygen Device Home Concentrator;Portable Concentrator    Sleep Oxygen Prescription Continuous    Liters per minute 2    Home Exercise Oxygen Prescription None    Home Resting Oxygen Prescription Continuous   as needed   Liters per minute 2      Intervention   Short Term Goals To learn and  exhibit compliance with exercise, home and travel O2 prescription;To learn and understand importance of monitoring SPO2 with pulse oximeter and demonstrate accurate use of the pulse oximeter.;To learn and understand importance of maintaining oxygen saturations>88%;To learn and demonstrate proper pursed lip breathing techniques or other breathing techniques.;To learn and demonstrate proper use of respiratory medications    Long  Term Goals Exhibits compliance with exercise, home and travel O2 prescription;Verbalizes importance of monitoring SPO2 with pulse oximeter and return demonstration;Maintenance of O2 saturations>88%;Exhibits proper breathing techniques, such as pursed lip breathing or other method taught during program session;Compliance with respiratory medication;Demonstrates proper use of MDI's           Oxygen Re-Evaluation:  Oxygen Re-Evaluation    Row Name 09/19/20 1127 10/07/20 1113 11/02/20 1107 11/30/20 1118 12/21/20 1127     Program Oxygen Prescription   Program Oxygen Prescription -- Continuous;E-Tanks Continuous;E-Tanks Continuous;E-Tanks Continuous   Liters per minute -- 3 3 3 3      Home Oxygen   Home Oxygen Device Home Concentrator;Portable Concentrator Home Concentrator;Portable Concentrator Home Concentrator;Portable Concentrator Home Concentrator;Portable Concentrator Home Concentrator;Portable Concentrator;E-Tanks   Sleep Oxygen Prescription Continuous Continuous Continuous Continuous Continuous   Liters per minute 2 2 2 2 2    Home Exercise Oxygen Prescription None Continuous Continuous Continuous Continuous   Liters per minute -- 2 2 2 2    Home Resting Oxygen Prescription Continuous Continuous Continuous Continuous Continuous   Liters per minute 2 2 2 2 2   as Needed   Compliance with Home Oxygen Use -- Yes No  He will use it overnight and will use it when he feels he needs it. Yes Yes     Goals/Expected Outcomes   Short Term Goals To learn and exhibit compliance  with exercise, home and travel O2 prescription;To learn and understand importance of monitoring SPO2 with pulse  oximeter and demonstrate accurate use of the pulse oximeter.;To learn and understand importance of maintaining oxygen saturations>88%;To learn and demonstrate proper pursed lip breathing techniques or other breathing techniques.;To learn and demonstrate proper use of respiratory medications To learn and understand importance of maintaining oxygen saturations>88%;To learn and understand importance of monitoring SPO2 with pulse oximeter and demonstrate accurate use of the pulse oximeter. To learn and understand importance of maintaining oxygen saturations>88%;To learn and understand importance of monitoring SPO2 with pulse oximeter and demonstrate accurate use of the pulse oximeter. To learn and understand importance of maintaining oxygen saturations>88%;To learn and understand importance of monitoring SPO2 with pulse oximeter and demonstrate accurate use of the pulse oximeter.;To learn and exhibit compliance with exercise, home and travel O2 prescription;To learn and demonstrate proper pursed lip breathing techniques or other breathing techniques.;To learn and demonstrate proper use of respiratory medications To learn and understand importance of monitoring SPO2 with pulse oximeter and demonstrate accurate use of the pulse oximeter.;To learn and understand importance of maintaining oxygen saturations>88%   Long  Term Goals Exhibits compliance with exercise, home and travel O2 prescription;Verbalizes importance of monitoring SPO2 with pulse oximeter and return demonstration;Maintenance of O2 saturations>88%;Exhibits proper breathing techniques, such as pursed lip breathing or other method taught during program session;Compliance with respiratory medication;Demonstrates proper use of MDI's Maintenance of O2 saturations>88%;Verbalizes importance of monitoring SPO2 with pulse oximeter and return  demonstration Maintenance of O2 saturations>88%;Verbalizes importance of monitoring SPO2 with pulse oximeter and return demonstration Exhibits compliance with exercise, home and travel O2 prescription;Verbalizes importance of monitoring SPO2 with pulse oximeter and return demonstration;Maintenance of O2 saturations>88%;Exhibits proper breathing techniques, such as pursed lip breathing or other method taught during program session;Compliance with respiratory medication;Demonstrates proper use of MDI's Maintenance of O2 saturations>88%;Verbalizes importance of monitoring SPO2 with pulse oximeter and return demonstration   Comments Reviewed PLB technique with pt.  Talked about how it works and it's importance in maintaining their exercise saturations. He has a pulse oximeter to check his oxygen saturation at home. Informed and explained why it is important to have one. Reviewed that oxygen saturations should be 88 percent and above. Patient has a pulse oximeter at home to check his oxygen. He reports cheking O2 2-3x/day and uses his nebulizer 2x/day. His O2 is usually 80-90, expressed importance of keeping O2 over 88. Reviewed PLB. Lenward is staying compliant with his oxygen and his breathing is getting better.  He continues to monitor his saturations and doing well with his breathing and using his PLB. [place] states he is going to use his oxygen when walking in to rehab. Informed him that his oxygen could be getting to low without it. He has a wrist band that monitors his oxygen and knows he needs to be 88 percent and above. He has no questions about his medications.   Goals/Expected Outcomes Short: Become more profiecient at using PLB.   Long: Become independent at using PLB. Short: monitor oxygen at home with exertion. Long: maintain oxygen saturations above 88 percent independently. ST: use O2 as prescribed at home LT: maintain O2 above 88 percent independently. Short: Continue to breathing better  Long; Continue  to improve compliance Short: wear oxygen into rehab. Long: maintain oxygen saturations of 88 percent and above independently.          Oxygen Discharge (Final Oxygen Re-Evaluation):  Oxygen Re-Evaluation - 12/21/20 1127      Program Oxygen Prescription   Program Oxygen Prescription Continuous    Liters per minute 3  Home Oxygen   Home Oxygen Device Home Concentrator;Portable Concentrator;E-Tanks    Sleep Oxygen Prescription Continuous    Liters per minute 2    Home Exercise Oxygen Prescription Continuous    Liters per minute 2    Home Resting Oxygen Prescription Continuous    Liters per minute 2   as Needed   Compliance with Home Oxygen Use Yes      Goals/Expected Outcomes   Short Term Goals To learn and understand importance of monitoring SPO2 with pulse oximeter and demonstrate accurate use of the pulse oximeter.;To learn and understand importance of maintaining oxygen saturations>88%    Long  Term Goals Maintenance of O2 saturations>88%;Verbalizes importance of monitoring SPO2 with pulse oximeter and return demonstration    Comments Algis states he is going to use his oxygen when walking in to rehab. Informed him that his oxygen could be getting to low without it. He has a wrist band that monitors his oxygen and knows he needs to be 88 percent and above. He has no questions about his medications.    Goals/Expected Outcomes Short: wear oxygen into rehab. Long: maintain oxygen saturations of 88 percent and above independently.           Initial Exercise Prescription:  Initial Exercise Prescription - 09/13/20 1400      Date of Initial Exercise RX and Referring Provider   Date 09/13/20    Referring Provider Jayme Cloud      Recumbant Bike   Level 1    RPM 60    Minutes 15    METs 1.8      NuStep   Level 1    SPM 80    Minutes 15    METs 1.8      Arm Ergometer   Level 1    RPM 30    Minutes 15    METs 1.8      Prescription Details   Frequency (times per week)  3    Duration Progress to 30 minutes of continuous aerobic without signs/symptoms of physical distress      Intensity   THRR 40-80% of Max Heartrate 104-131    Ratings of Perceived Exertion 11-13    Perceived Dyspnea 0-4      Resistance Training   Training Prescription Yes    Weight 3 lb    Reps 10-15           Perform Capillary Blood Glucose checks as needed.  Exercise Prescription Changes:  Exercise Prescription Changes    Row Name 09/13/20 1400 09/22/20 0700 10/04/20 0800 10/19/20 0800 10/31/20 1500     Response to Exercise   Blood Pressure (Admit) 140/74 132/68 154/68 160/80 142/70   Blood Pressure (Exercise) 158/64 142/70 176/70 144/80 150/64   Blood Pressure (Exit) 138/76 132/64 144/74 130/80 122/58   Heart Rate (Admit) 78 bpm 83 bpm 79 bpm 80 bpm 82 bpm   Heart Rate (Exercise) 104 bpm 91 bpm 88 bpm 92 bpm 86 bpm   Heart Rate (Exit) 96 bpm 79 bpm 80 bpm 82 bpm 78 bpm   Oxygen Saturation (Admit) 91 % 83 % 91 % 88 % 92 %   Oxygen Saturation (Exercise) 84 % 89 % 92 % 92 % 88 %   Oxygen Saturation (Exit) 85 % 92 % 95 % 92 % 97 %   Rating of Perceived Exertion (Exercise) 16 13 11 13 11    Perceived Dyspnea (Exercise) -- 1 1 0 2   Symptoms SOB -- SOB --  none   Duration -- Continue with 30 min of aerobic exercise without signs/symptoms of physical distress. Continue with 30 min of aerobic exercise without signs/symptoms of physical distress. Continue with 30 min of aerobic exercise without signs/symptoms of physical distress. Continue with 30 min of aerobic exercise without signs/symptoms of physical distress.   Intensity -- THRR unchanged THRR unchanged THRR unchanged THRR unchanged     Progression   Progression -- Continue to progress workloads to maintain intensity without signs/symptoms of physical distress. Continue to progress workloads to maintain intensity without signs/symptoms of physical distress. Continue to progress workloads to maintain intensity without  signs/symptoms of physical distress. Continue to progress workloads to maintain intensity without signs/symptoms of physical distress.   Average METs -- 2.2 2.3 2.5 2.8     Resistance Training   Training Prescription -- Yes Yes Yes Yes   Weight -- 5 lb 5 lb 5 lb 5 lb   Reps -- 10-15 10-15 10-15 10-15     Interval Training   Interval Training -- -- No No No     Oxygen   Oxygen -- -- Continuous Continuous Continuous   Liters -- -- 3 3 3      NuStep   Level -- 1 6 6 6    SPM -- 80 -- 80 --   Minutes -- 15 30 15 15    METs -- 2.2 2.3 2.5 2.8   Row Name 11/14/20 1600 11/30/20 1400 12/14/20 1000 12/26/20 1100       Response to Exercise   Blood Pressure (Admit) 138/70 118/60 140/64 142/80    Blood Pressure (Exercise) 142/76 130/70 138/58 138/64    Blood Pressure (Exit) 128/58 118/58 122/58 126/70    Heart Rate (Admit) 83 bpm 100 bpm 74 bpm 78 bpm    Heart Rate (Exercise) 89 bpm 888 bpm 87 bpm 78 bpm    Heart Rate (Exit) 87 bpm 81 bpm 78 bpm 64 bpm    Oxygen Saturation (Admit) 94 % 90 % 89 % 88 %    Oxygen Saturation (Exercise) 93 % 92 % 87 % 88 %    Oxygen Saturation (Exit) 92 % 95 % 92 % 98 %    Rating of Perceived Exertion (Exercise) 11 13 11 11     Perceived Dyspnea (Exercise) -- 2 0 1    Symptoms none SOB -- SOB    Duration Continue with 30 min of aerobic exercise without signs/symptoms of physical distress. Continue with 30 min of aerobic exercise without signs/symptoms of physical distress. Continue with 30 min of aerobic exercise without signs/symptoms of physical distress. Continue with 30 min of aerobic exercise without signs/symptoms of physical distress.    Intensity THRR unchanged THRR unchanged THRR unchanged THRR unchanged         Progression   Progression Continue to progress workloads to maintain intensity without signs/symptoms of physical distress. Continue to progress workloads to maintain intensity without signs/symptoms of physical distress. Continue to progress  workloads to maintain intensity without signs/symptoms of physical distress. Continue to progress workloads to maintain intensity without signs/symptoms of physical distress.    Average METs 2.15 2.4 1.9 2.2         Resistance Training   Training Prescription Yes Yes Yes Yes    Weight 5 lb 5 lb 5 lb 5 lb    Reps 10-15 10-15 10-15 10-15         Interval Training   Interval Training No No No No  Oxygen   Oxygen Continuous Continuous Continuous Continuous    Liters 3 3 3 6   bad breathing day         NuStep   Level 4 6 6 4     SPM 80 -- -- --    Minutes 15 15 15 30     METs 2.15 2.4 1.9 2.2         Home Exercise Plan   Plans to continue exercise at -- Home (comment)  bike, walking Home (comment)  bike, walking Home (comment)  bike, walking    Frequency -- Add 2 additional days to program exercise sessions. Add 2 additional days to program exercise sessions. Add 2 additional days to program exercise sessions.    Initial Home Exercises Provided -- 11/02/20 11/02/20 11/02/20           Exercise Comments:   Exercise Goals and Review:  Exercise Goals    Row Name 09/13/20 1421             Exercise Goals   Increase Physical Activity Yes       Intervention Provide advice, education, support and counseling about physical activity/exercise needs.;Develop an individualized exercise prescription for aerobic and resistive training based on initial evaluation findings, risk stratification, comorbidities and participant's personal goals.       Expected Outcomes Short Term: Attend rehab on a regular basis to increase amount of physical activity.;Long Term: Add in home exercise to make exercise part of routine and to increase amount of physical activity.;Long Term: Exercising regularly at least 3-5 days a week.       Increase Strength and Stamina Yes       Intervention Provide advice, education, support and counseling about physical activity/exercise needs.;Develop an individualized  exercise prescription for aerobic and resistive training based on initial evaluation findings, risk stratification, comorbidities and participant's personal goals.       Expected Outcomes Short Term: Increase workloads from initial exercise prescription for resistance, speed, and METs.;Short Term: Perform resistance training exercises routinely during rehab and add in resistance training at home;Long Term: Improve cardiorespiratory fitness, muscular endurance and strength as measured by increased METs and functional capacity (11/04/20)       Able to understand and use rate of perceived exertion (RPE) scale Yes       Intervention Provide education and explanation on how to use RPE scale       Expected Outcomes Short Term: Able to use RPE daily in rehab to express subjective intensity level;Long Term:  Able to use RPE to guide intensity level when exercising independently       Able to understand and use Dyspnea scale Yes       Intervention Provide education and explanation on how to use Dyspnea scale       Expected Outcomes Short Term: Able to use Dyspnea scale daily in rehab to express subjective sense of shortness of breath during exertion;Long Term: Able to use Dyspnea scale to guide intensity level when exercising independently       Knowledge and understanding of Target Heart Rate Range (THRR) Yes       Intervention Provide education and explanation of THRR including how the numbers were predicted and where they are located for reference       Expected Outcomes Short Term: Able to state/look up THRR;Short Term: Able to use daily as guideline for intensity in rehab;Long Term: Able to use THRR to govern intensity when exercising independently       Able  to check pulse independently Yes       Intervention Provide education and demonstration on how to check pulse in carotid and radial arteries.;Review the importance of being able to check your own pulse for safety during independent exercise       Expected  Outcomes Short Term: Able to explain why pulse checking is important during independent exercise;Long Term: Able to check pulse independently and accurately       Understanding of Exercise Prescription Yes       Intervention Provide education, explanation, and written materials on patient's individual exercise prescription       Expected Outcomes Short Term: Able to explain program exercise prescription;Long Term: Able to explain home exercise prescription to exercise independently              Exercise Goals Re-Evaluation :  Exercise Goals Re-Evaluation    Row Name 09/19/20 1126 09/22/20 0734 10/04/20 0854 10/07/20 1123 10/19/20 0826     Exercise Goal Re-Evaluation   Exercise Goals Review Increase Physical Activity;Able to understand and use rate of perceived exertion (RPE) scale;Knowledge and understanding of Target Heart Rate Range (THRR);Understanding of Exercise Prescription;Increase Strength and Stamina;Able to understand and use Dyspnea scale;Able to check pulse independently Increase Physical Activity;Increase Strength and Stamina Increase Physical Activity;Increase Strength and Stamina;Understanding of Exercise Prescription Understanding of Exercise Prescription Increase Physical Activity;Increase Strength and Stamina   Comments Reviewed RPE and dyspnea scales, THR and program prescription with pt today.  Pt voiced understanding and was given a copy of goals to take home. Greyson is off to a good start in [facility].  Staff will monitor progress. Conall has requested to just use the T4 NuStep as he does not like the other machines, they feel uncomfortable. He does alternate between using both arms and legs, just legs, and just arms.  He also has difficulty with his mask,but does better with the exercise mask.  We will continue to montior his progress on the NuStep. Raph states that he is interested in the [employer] once he is done with the program. He is going to look into his insurance to see if  he has [employer]. Informed him that the gym has oxygen tanks and would be good to continue his exercise. Yavuz has been attending twice per week most weeks.  Staff will review home exercise so he can add 1-2 days at home for better progress.  He has moved up to level 6 on NS.   Expected Outcomes Short: Use RPE daily to regulate intensity. Long: Follow program prescription in THR. Short:  attend consistently Long:  improve stamina Short: Maintain spm consistently Long; Conitnue to improve stamina. Short: check with insurance if he has [employer]. Long: maintain an exercise regimine post [facility]. Short: review home exercise Long: exercise 3-5 days per week consistently   Row Name 10/31/20 1517 11/02/20 1141 11/14/20 1642 11/30/20 1112 12/14/20 1006     Exercise Goal Re-Evaluation   Exercise Goals Review Increase Physical Activity;Increase Strength and Stamina;Understanding of Exercise Prescription Increase Physical Activity;Increase Strength and Stamina;Understanding of Exercise Prescription Increase Physical Activity;Increase Strength and Stamina;Understanding of Exercise Prescription Increase Physical Activity;Increase Strength and Stamina;Understanding of Exercise Prescription Increase Physical Activity;Increase Strength and Stamina   Comments Only attended once since last review. Alanson continues to just use the same station as it does not hurt him at all.  We will continue to montior his progress. Reviewed home exercise with pt today.  Pt plans to walk/bike, may go to [facility] for  exercise.  Reviewed THR, pulse, RPE, sign and symptoms, pulse oximetery and when to call 911 or MD.  Also discussed weather considerations and indoor options.  Pt voiced understanding. Rayna SextonRalph lowered level on T4 today.  Staff will continue to monitor progress. Rayna SextonRalph is doing well in rehab.  He is walking on his off days.  He is feeling stronger overall and better. Rayna SextonRalph has moved up to level 6 on NS.  More consistent  attendance would help him progress more.   Expected Outcomes Short: Return to regular attendance  Long: Continue to exercise regularly. -- Short: get back up to previous levels Long: maintain consistent exercise Short: Continue to stay active  Long: Continue to improve stamina. Short:  attend at least twice per week Long: improve overall stamina   Row Name 12/26/20 1116             Exercise Goal Re-Evaluation   Exercise Goals Review Increase Physical Activity;Increase Strength and Stamina       Comments Rayna SextonRalph stated that he feels like he is making progress with his exercise and is getting stronger. He said he works at his own pace and feels like he is doing well. HR and SaO2 are maintained in acceptable ranges duing exercise.       Expected Outcomes Short: continue to attend pulmonary rehab at least 2 days a week. Long: Plans to exercise outside and be more active once the temperature increases which is easier on his breathing.              Discharge Exercise Prescription (Final Exercise Prescription Changes):  Exercise Prescription Changes - 12/26/20 1100      Response to Exercise   Blood Pressure (Admit) 142/80    Blood Pressure (Exercise) 138/64    Blood Pressure (Exit) 126/70    Heart Rate (Admit) 78 bpm    Heart Rate (Exercise) 78 bpm    Heart Rate (Exit) 64 bpm    Oxygen Saturation (Admit) 88 %    Oxygen Saturation (Exercise) 88 %    Oxygen Saturation (Exit) 98 %    Rating of Perceived Exertion (Exercise) 11    Perceived Dyspnea (Exercise) 1    Symptoms SOB    Duration Continue with 30 min of aerobic exercise without signs/symptoms of physical distress.    Intensity THRR unchanged      Progression   Progression Continue to progress workloads to maintain intensity without signs/symptoms of physical distress.    Average METs 2.2      Resistance Training   Training Prescription Yes    Weight 5 lb    Reps 10-15      Interval Training   Interval Training No       Oxygen   Oxygen Continuous    Liters 6   bad breathing day     NuStep   Level 4    Minutes 30    METs 2.2      Home Exercise Plan   Plans to continue exercise at Home (comment)   bike, walking   Frequency Add 2 additional days to program exercise sessions.    Initial Home Exercises Provided 11/02/20           Nutrition:  Target Goals: Understanding of nutrition guidelines, daily intake of sodium 1500mg , cholesterol 200mg , calories 30% from fat and 7% or less from saturated fats, daily to have 5 or more servings of fruits and vegetables.  Education: All About Nutrition: -Group instruction provided by verbal, written  material, interactive activities, discussions, models, and posters to present general guidelines for heart healthy nutrition including fat, fiber, MyPlate, the role of sodium in heart healthy nutrition, utilization of the nutrition label, and utilization of this knowledge for meal planning. Follow up email sent as well. Written material given at graduation. Flowsheet Row Cardiac Rehab from 09/02/2018 in [facility] Cardiac and Pulmonary Rehab  Date 08/26/18  Educator LB  Instruction Review Code 1- Verbalizes Understanding      Biometrics:  Pre Biometrics - 09/13/20 1422      Pre Biometrics   Height 5\' 5"  (1.651 m)    Weight 182 lb 14.4 oz (83 kg)    BMI (Calculated) 30.44    Single Leg Stand 10 seconds            Nutrition Therapy Plan and Nutrition Goals:  Nutrition Therapy & Goals - 11/02/20 1124      Personal Nutrition Goals   Nutrition Goal No goals at this time.    Comments Got two more books on DM at the book store this weekend. B; cereal - cherrios, rice krispies, and oatmeal, eggs and bacon, tail of ham - egss and cheese on a roll. L: sometimes will skip D: loves pasta. He eats salads, chicken, steak, escarole and beans. He doesn't care for poatoes aside from hashbrowns. S: cookies - sleep time tea - he will also have protein drinks with his coffee in the  afternoon. He reports using stevia and limiting simple sugars. He reports being through education before and not wanting to make any changes at this time.           Nutrition Assessments:  Nutrition Assessments - 09/13/20 1425      MEDFICTS Scores   Pre Score 65          MEDIFICTS Score Key:  ?70 Need to make dietary changes   40-70 Heart Healthy Diet  ? 40 Therapeutic Level Cholesterol Diet   Picture Your Plate Scores:  <16 Unhealthy dietary pattern with much room for improvement.  41-50 Dietary pattern unlikely to meet recommendations for good health and room for improvement.  51-60 More healthful dietary pattern, with some room for improvement.   >60 Healthy dietary pattern, although there may be some specific behaviors that could be improved.   Nutrition Goals Re-Evaluation:  Nutrition Goals Re-Evaluation    Row Name 11/30/20 1117 12/28/20 1144           Goals   Nutrition Goal Eat healthier Pt would not like to make goals at this time      Comment Lilton has gotten better about his diet and is really trying to be good through holidays.  He is feeling better about it.Rayna Sexton continues to have no questions regarding nutritoin and would not like to make any changes. He reports eating a mostly healthy diet.      Expected Outcome Short: Continue to focus on heart healthy Long; Continue to work on weight loss Pt would not like to make goals at this time             Nutrition Goals Discharge (Final Nutrition Goals Re-Evaluation):  Nutrition Goals Re-Evaluation - 12/28/20 1144      Goals   Nutrition Goal Pt would not like to make goals at this time    Comment Daeshon continues to have no questions regarding nutritoin and would not like to make any changes. He reports eating a mostly healthy diet.    Expected Outcome Pt  would not like to make goals at this time           Psychosocial: Target Goals: Acknowledge presence or absence of significant depression and/or  stress, maximize coping skills, provide positive support system. Participant is able to verbalize types and ability to use techniques and skills needed for reducing stress and depression.   Education: Stress, Anxiety, and Depression - Group verbal and visual presentation to define topics covered.  Reviews how body is impacted by stress, anxiety, and depression.  Also discusses healthy ways to reduce stress and to treat/manage anxiety and depression.  Written material given at graduation. Flowsheet Row Pulmonary Rehab from 09/21/2020 in [facility] Cardiac and Pulmonary Rehab  Date 09/21/20  Educator [facility]  Instruction Review Code 1- [employer] Understanding      Education: Sleep Hygiene -Provides group verbal and written instruction about how sleep can affect your health.  Define sleep hygiene, discuss sleep cycles and impact of sleep habits. Review good sleep hygiene tips.  Flowsheet Row Cardiac Rehab from 09/02/2018 in [facility] Cardiac and Pulmonary Rehab  Date 08/19/18  Educator [facility]  Instruction Review Code 1- Verbalizes Understanding  [Arrived late to class]      Initial Review & Psychosocial Screening:  Initial Psych Review & Screening - 09/12/20 1116      Initial Review   Current issues with Current Anxiety/Panic      Family Dynamics   Good Support System? Yes   Johnnye Sima, 6 years, family     Barriers   Psychosocial barriers to participate in program There are no identifiable barriers or psychosocial needs.;The patient should benefit from training in stress management and relaxation.;Psychosocial barriers identified (see note)      Screening Interventions   Interventions Encouraged to exercise    Expected Outcomes Short Term goal: Utilizing psychosocial counselor, staff and physician to assist with identification of specific Stressors or current issues interfering with healing process. Setting desired goal for each stressor or current issue identified.;Long Term Goal: Stressors or current  issues are controlled or eliminated.;Short Term goal: Identification and review with participant of any Quality of Life or Depression concerns found by scoring the questionnaire.;Long Term goal: The participant improves quality of Life and PHQ9 Scores as seen by post scores and/or verbalization of changes           Quality of Life Scores:  Scores of 19 and below usually indicate a poorer quality of life in these areas.  A difference of  2-3 points is a clinically meaningful difference.  A difference of 2-3 points in the total score of the Quality of Life Index has been associated with significant improvement in overall quality of life, self-image, physical symptoms, and general health in studies assessing change in quality of life.  PHQ-9: Recent Review Flowsheet Data    Depression screen [facility] 2/9 09/13/2020 08/26/2018 05/01/2018   Decreased Interest 0 0 0   Down, Depressed, Hopeless 0 0 0   PHQ - 2 Score 0 0 0   Altered sleeping 0 0 0   Tired, decreased energy 1 1 0   Change in appetite 0 - 0   Feeling bad or failure about yourself  0 0 0   Trouble concentrating 0 0 0   Moving slowly or fidgety/restless 0 0 0   Suicidal thoughts 0 0 0   PHQ-9 Score 1 1 0   Difficult doing work/chores Not difficult at all Not difficult at all Not difficult at all     Interpretation  of Total Score  Total Score Depression Severity:  1-4 = Minimal depression, 5-9 = Mild depression, 10-14 = Moderate depression, 15-19 = Moderately severe depression, 20-27 = Severe depression   Psychosocial Evaluation and Intervention:  Psychosocial Evaluation - 09/12/20 1136      Psychosocial Evaluation & Interventions   Comments Anakin doe not have any barriers to attending the program.  He remembers attending Cardiac Rehab several years ago and is looking forward to starting Pulmonary Rehab. He does get anxiety at time when his breathing is hard. He has learned to slow down, and reset to help get his breathing back to his  normal. He lives with his Julio Sicks and their 2 dogs. He has a great support system with [name] and his family. He continues to be active, traveling and getting out his bike. He has purchased his own portable oxygen concentrator for at home and traveling. He should do great in the program.    Expected Outcomes STG: Ralf will attend all scheduled sessions to gain the most from the program. LTG: Korey will see improvement in his ADL activity and be able to maintain this improvement after discharge.    Continue Psychosocial Services  Follow up required by staff           Psychosocial Re-Evaluation:  Psychosocial Re-Evaluation    Row Name 10/07/20 1118 11/02/20 1111 11/30/20 1114 12/26/20 1134       Psychosocial Re-Evaluation   Current issues with Current Stress Concerns;Current Anxiety/Panic Current Anxiety/Panic Current Anxiety/Panic;Current Stress Concerns Current Anxiety/Panic;Current Stress Concerns    Comments Dimitris gets anxious when he gets short of breath. His breathing is the only issue that gets him down. He states that his mind is sharp and is willing to get healthier to breath easier. He reports he is on xanax for anxiety for 7 years and reports it's helping. He reports getting overwhelmed sometimes with anxiety when things don't go right, gotten woirse since his wife passed 7 years ago. He will ride his motorcycle and watches TV to reduce stress and anxiety, he has techniques where he reduces his panic - ice cold water. Support system in girlfriend who he lives with and chicldren who are in [place]. Gale is doing well with exercise.  He is frustrated about not seeing his kids and grandkids for Christmas, but they do have plans to go up in January.  He is then just worried about the cost of gas and travel.  He is planning to ride his motorcycle this weekend.  He is sleeping well and breathing better with the cooler air.  His anxiety has been up a little with his frustrations, but overall  doing well on his xanax. Deago reported no major changes in his anxiety. He does continue to take his anxiety medication and works with his breathing when he does become anxious. He does have a partner that he talks to for support and tries not to think to far ahead about things that he knows will make his anxious. He stated he wants to call Dr. Jayme Cloud this week to talk about his breathing difficulties when he does have anxiety attacks.    Expected Outcomes Short: continue [facility] to improve shortness of breath. Long: maintain exercise to keep stress at a minimum. Short: continue [facility] to improve shortness of breath. Long: maintain exercise to keep stress at a minimum, utilize support system, keep using relaxing activities and techniques. Short: Look forward to traveling after Christmas.  Long: Continue to manage activities.  Short: Call doctor to discuss anxiety concerns. Long: continue to work on managing anxiety and maintaining good mental health.    Interventions Encouraged to attend Pulmonary Rehabilitation for the exercise Encouraged to attend Pulmonary Rehabilitation for the exercise Encouraged to attend Pulmonary Rehabilitation for the exercise Encouraged to attend Pulmonary Rehabilitation for the exercise    Continue Psychosocial Services  Follow up required by staff Follow up required by staff Follow up required by staff Follow up required by staff         Initial Review   Source of Stress Concerns -- Chronic Illness -- --           Psychosocial Discharge (Final Psychosocial Re-Evaluation):  Psychosocial Re-Evaluation - 12/26/20 1134      Psychosocial Re-Evaluation   Current issues with Current Anxiety/Panic;Current Stress Concerns    Comments Ananias reported no major changes in his anxiety. He does continue to take his anxiety medication and works with his breathing when he does become anxious. He does have a partner that he talks to for support and tries not to think to far ahead  about things that he knows will make his anxious. He stated he wants to call Dr. Jayme Cloud this week to talk about his breathing difficulties when he does have anxiety attacks.    Expected Outcomes Short: Call doctor to discuss anxiety concerns. Long: continue to work on managing anxiety and maintaining good mental health.    Interventions Encouraged to attend Pulmonary Rehabilitation for the exercise    Continue Psychosocial Services  Follow up required by staff           Education: Education Goals: Education classes will be provided on a weekly basis, covering required topics. Participant will state understanding/return demonstration of topics presented.  Learning Barriers/Preferences:  Learning Barriers/Preferences - 09/12/20 1121      Learning Barriers/Preferences   Learning Barriers None    Learning Preferences None           General Pulmonary Education Topics:  Infection Prevention: - Provides verbal and written material to individual with discussion of infection control including proper hand washing and proper equipment cleaning during exercise session. Flowsheet Row Pulmonary Rehab from 09/21/2020 in [facility] Cardiac and Pulmonary Rehab  Date 09/13/20  Educator AS  Instruction Review Code 1- Verbalizes Understanding      Falls Prevention: - Provides verbal and written material to individual with discussion of falls prevention and safety. Flowsheet Row Pulmonary Rehab from 09/21/2020 in [facility] Cardiac and Pulmonary Rehab  Date 09/13/20  Educator AS  Instruction Review Code 1- Verbalizes Understanding      Chronic Lung Disease Review: - Group verbal instruction with posters, models, PowerPoint presentations and videos,  to review new updates, new respiratory medications, new advancements in procedures and treatments. Providing information on websites and "800" numbers for continued self-education. Includes information about supplement oxygen, available portable oxygen  systems, continuous and intermittent flow rates, oxygen safety, concentrators, and Medicare reimbursement for oxygen. Explanation of Pulmonary Drugs, including class, frequency, complications, importance of spacers, rinsing mouth after steroid MDI's, and proper cleaning methods for nebulizers. Review of basic lung anatomy and physiology related to function, structure, and complications of lung disease. Review of risk factors. Discussion about methods for diagnosing sleep apnea and types of masks and machines for OSA. Includes a review of the use of types of environmental controls: home humidity, furnaces, filters, dust mite/pet prevention, HEPA vacuums. Discussion about weather changes, air quality and the benefits of nasal washing. Instruction on  Warning signs, infection symptoms, calling MD promptly, preventive modes, and value of vaccinations. Review of effective airway clearance, coughing and/or vibration techniques. Emphasizing that all should Create an Action Plan. Written material given at graduation.   AED/CPR: - Group verbal and written instruction with the use of models to demonstrate the basic use of the AED with the basic ABC's of resuscitation. Flowsheet Row Cardiac Rehab from 09/02/2018 in [facility] Cardiac and Pulmonary Rehab  Date 09/02/18  Educator [place]  Instruction Review Code 1- Verbalizes Understanding       Anatomy and Cardiac Procedures: - Group verbal and visual presentation and models provide information about basic cardiac anatomy and function. Reviews the testing methods done to diagnose heart disease and the outcomes of the test results. Describes the treatment choices: Medical Management, Angioplasty, or Coronary Bypass Surgery for treating various heart conditions including Myocardial Infarction, Angina, Valve Disease, and Cardiac Arrhythmias.  Written material given at graduation. Flowsheet Row Cardiac Rehab from 09/02/2018 in [facility] Cardiac and Pulmonary Rehab  Date 08/07/18   Educator CE  Instruction Review Code 1- Verbalizes Understanding      Medication Safety: - Group verbal and visual instruction to review commonly prescribed medications for heart and lung disease. Reviews the medication, class of the drug, and side effects. Includes the steps to properly store meds and maintain the prescription regimen.  Written material given at graduation. Flowsheet Row Cardiac Rehab from 09/02/2018 in [facility] Cardiac and Pulmonary Rehab  Date 08/12/18  Educator SB  Instruction Review Code 1- Verbalizes Understanding      Other: -Provides group and verbal instruction on various topics (see comments)   Knowledge Questionnaire Score:  Knowledge Questionnaire Score - 09/13/20 1424      Knowledge Questionnaire Score   Pre Score 16/18            Core Components/Risk Factors/Patient Goals at Admission:  Personal Goals and Risk Factors at Admission - 09/13/20 1426      Core Components/Risk Factors/Patient Goals on Admission    Weight Management Yes;Weight Loss    Intervention Weight Management: Develop a combined nutrition and exercise program designed to reach desired caloric intake, while maintaining appropriate intake of nutrient and fiber, sodium and fats, and appropriate energy expenditure required for the weight goal.    Admit Weight 182 lb 14.4 oz (83 kg)    Goal Weight: Short Term 180 lb (81.6 kg)    Goal Weight: Long Term 175 lb (79.4 kg)    Expected Outcomes Short Term: Continue to assess and modify interventions until short term weight is achieved;Long Term: Adherence to nutrition and physical activity/exercise program aimed toward attainment of established weight goal;Weight Loss: Understanding of general recommendations for a balanced deficit meal plan, which promotes 1-2 lb weight loss per week and includes a negative energy balance of [phone] kcal/d    Diabetes Yes    Intervention Provide education about signs/symptoms and action to take for  hypo/hyperglycemia.;Provide education about proper nutrition, including hydration, and aerobic/resistive exercise prescription along with prescribed medications to achieve blood glucose in normal ranges: Fasting glucose 65-99 mg/dL    Expected Outcomes Short Term: Participant verbalizes understanding of the signs/symptoms and immediate care of hyper/hypoglycemia, proper foot care and importance of medication, aerobic/resistive exercise and nutrition plan for blood glucose control.;Long Term: Attainment of HbA1C < 7%.    Hypertension Yes    Intervention Provide education on lifestyle modifcations including regular physical activity/exercise, weight management, moderate sodium restriction and increased consumption of fresh fruit, vegetables, and  low fat dairy, alcohol moderation, and smoking cessation.;Monitor prescription use compliance.    Expected Outcomes Short Term: Continued assessment and intervention until BP is < 140/75mm HG in hypertensive participants. < 130/70mm HG in hypertensive participants with diabetes, heart failure or chronic kidney disease.;Long Term: Maintenance of blood pressure at goal levels.    Lipids Yes    Intervention Provide education and support for participant on nutrition & aerobic/resistive exercise along with prescribed medications to achieve LDL 70mg , HDL >40mg .    Expected Outcomes Short Term: Participant states understanding of desired cholesterol values and is compliant with medications prescribed. Participant is following exercise prescription and nutrition guidelines.;Long Term: Cholesterol controlled with medications as prescribed, with individualized exercise RX and with personalized nutrition plan. Value goals: LDL < 70mg , HDL > 40 mg.    Personal Goal Other Yes           Education:Diabetes - Individual verbal and written instruction to review signs/symptoms of diabetes, desired ranges of glucose level fasting, after meals and with exercise. Acknowledge that  pre and post exercise glucose checks will be done for 3 sessions at entry of program. Flowsheet Row Cardiac Rehab from 09/02/2018 in [facility] Cardiac and Pulmonary Rehab  Date 05/01/18  Educator [facility]  Instruction Review Code 1- Verbalizes Understanding      Know Your Numbers and Heart Failure: - Group verbal and visual instruction to discuss disease risk factors for cardiac and pulmonary disease and treatment options.  Reviews associated critical values for Overweight/Obesity, Hypertension, Cholesterol, and Diabetes.  Discusses basics of heart failure: signs/symptoms and treatments.  Introduces Heart Failure Zone chart for action plan for heart failure.  Written material given at graduation. Flowsheet Row Cardiac Rehab from 09/02/2018 in [facility] Cardiac and Pulmonary Rehab  Date 08/07/18  Educator CE  Instruction Review Code 1- Verbalizes Understanding      Core Components/Risk Factors/Patient Goals Review:   Goals and Risk Factor Review    Row Name 10/07/20 1116 11/02/20 1120 11/30/20 1121 12/26/20 1123       Core Components/Risk Factors/Patient Goals Review   Personal Goals Review Improve shortness of breath with ADL's Improve shortness of breath with ADL's;Diabetes Improve shortness of breath with ADL's;Diabetes;Weight Management/Obesity;Hypertension Improve shortness of breath with ADL's;Diabetes;Weight Management/Obesity;Hypertension    Review If he is doing chores at home and if things are too strennuous he get anxious and short of breath.Spoke to patient about their shortness of breath and what they can do to improve. Patient has been informed of breathing techniques when starting the program. Patient is informed to tell staff if they have had any med changes and that certain meds they are taking or not taking can be causing shortness of breath. This weekend BG got up to 400 after syrup and pineapple juice - took 2 metformin and it went down to 40. Feeling better now. BG in am usually 125.  Educated on hypo and hyperglycemia - how to identify and how to normalize BG. He reports doing better with ADLs at home - will get worse if he moves too quickly or carries things that are too heavy. Alexandre is officially two years out from qutting smoking.  His breathing is getting better.  His sugars have been doing well overall.  His pressures continue to do well for him. Jaidin reports taking all medications and monitoring blood sugars at home. His weight has been steady and he manages his SOB with O2 as needed depending on temperature and air pressure.    Expected Outcomes  Short: Attend [facility] regularly to improve shortness of breath with ADL's. Long: maintain independence with ADL's Short: Attend [facility] regularly to improve shortness of breath with ADL's, use O2 as prescribed, continue with management of BG (limiting simple sugars) Long: maintain independence with ADL's Short: Continue to work on breathing  Long: Continue to manage risk factors. Short: continue to work on purse lip breathing to manage SOB and continue to take all medications as prescribed. Long: Continue heart healthy lifestyle to manage cardiac risk factors.           Core Components/Risk Factors/Patient Goals at Discharge (Final Review):   Goals and Risk Factor Review - 12/26/20 1123      Core Components/Risk Factors/Patient Goals Review   Personal Goals Review Improve shortness of breath with ADL's;Diabetes;Weight Management/Obesity;Hypertension    Review Moataz reports taking all medications and monitoring blood sugars at home. His weight has been steady and he manages his SOB with O2 as needed depending on temperature and air pressure.    Expected Outcomes Short: continue to work on purse lip breathing to manage SOB and continue to take all medications as prescribed. Long: Continue heart healthy lifestyle to manage cardiac risk factors.           ITP Comments:  ITP Comments    Row Name 09/12/20 1129 09/13/20 1438  09/19/20 1125 09/21/20 0618 10/19/20 0717   ITP Comments Virtual orientation call completed today. he has an appointment on Date: [id number] for EP eval and gym Orientation.  Documentation of diagnosis can be found in [facility] Date: 04/14/2020. Completed and gym orientation. Initial ITP created and sent for review to Dr. Bethann Punches, Medical Director. First full day of exercise!  Patient was oriented to gym and equipment including functions, settings, policies, and procedures.  Patient's individual exercise prescription and treatment plan were reviewed.  All starting workloads were established based on the results of the 6 minute walk test done at initial orientation visit.  The plan for exercise progression was also introduced and progression will be customized based on patient's performance and goals. 30 Day review completed. Medical Director ITP review done, changes made as directed, and signed approval by Medical Director. 30 Day review completed. Medical Director ITP review done, changes made as directed, and signed approval by Medical Director.   Row Name 11/16/20 1043 12/14/20 0632 01/11/21 0945       ITP Comments 30 Day review completed. Medical Director ITP review done, changes made as directed, and signed approval by Medical Director. 30 Day review completed. Medical Director ITP review done, changes made as directed, and signed approval by Medical Director. 30 Day review completed. Medical Director ITP review done, changes made as directed, and signed approval by Medical Director.            Comments:

## 2021-01-11 NOTE — ED Provider Notes (Signed)
[facility] Emergency Department Provider Note ____________________________________________   Event Date/Time   First MD Initiated Contact with Patient 01/11/21 [phone]     (approximate)  I have reviewed the triage vital signs and the nursing notes.  HISTORY  Chief Complaint Shortness of Breath (Covid +)   HPI Harold Parker is a 75 y.o. malewho presents to the ED for evaluation of shortness of breath.   Chart review indicates hx history of COPD on 2 L home O2, CAD, HTN, HLD, DM on oral agents. 2019 bioprosthetic valve for aortic stenosis, s/p CABG.  Patient reports COVID-19 1 week ago. Reports intermittent fevers and watery diarrhea. Patient reports increasing shortness of breath over the past 6 hours.  Patient reports getting up to void overnight tonight, feeling extraordinarily short of breath and anxious, and presents to the ED for evaluation.  Reports nonproductive cough without chest pain or syncope.   Past Medical History:  Diagnosis Date  . Bilateral carotid bruits    a. 01/2018 U/S: < 50% bilat ICA stenosis.  Marland Kitchen. CAD (coronary artery disease)    a. 1998 s/p MI and BMS [facility]([place], [place]); b. 1999 redo PCI/rotablator in setting of what sounds like ISR;  c. Multiple stress tests over the years - last ~ 2017, reportedly nl; d. 01/2018 NSTEMI/Cath: LM 446m/d, LAD 50p, 40p/m, D1 60ost, OM1 95, RCA 100ost/p w/ L->R collats, EF 45%; e. s/p 3V CABG 02/19/18 (LIMA-LAD, VG-D1, VG-OM)  . Chronic lower back pain   . COPD (chronic obstructive pulmonary disease) (HCC)   . GIB (gastrointestinal bleeding)    a. 02/2018 GIB and anemia w/ Hgb of 4.7 on presentation; b. 03/2018 EGD: 2 nonbleeding duodenal ulcers.  Marland Kitchen. HTN (hypertension)   . Hypercholesteremia   . Ischemic cardiomyopathy    a. 01/2018 Echo: EF 40-45%, mid-apicalanteroseptal, ant, apical sev HK, mod apicalinferior HK. Gr2 DD. Mod AS, mild MR, mod dil LA, PASP 50mmHg.  . Moderate aortic stenosis    a. 01/2018 Echo: Mod  AS, mean grad (S) 20mmHg, Valve area (VTI) 1.06 cm^2, (Vmax) 1.27 cm^2; b. 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  . Myocardial infarction (HCC) ~ 1998/1999  . 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 02/19/2018 AVR: 25 mm Edwards Inspiris Resilia stented bovine pericardial tissue valve  . 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 Tobacco abuse   . Type II diabetes mellitus [facility](HCC)     Patient Active Problem List   Diagnosis Date Noted  . Melena   . Duodenal ulceration   . Gastrointestinal hemorrhage   . Severe anemia 03/14/2018  . 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 S/P aortic valve replacement with bioprosthetic valve  02/19/2018  . Preoperative respiratory examination 02/13/2018  . Acute respiratory failure with hypoxia (HCC) 02/11/2018  . Influenza with respiratory manifestation 02/11/2018  . Stage 3 severe COPD by GOLD classification (HCC)   . Aortic stenosis   . Bronchitis, chronic obstructive, with exacerbation (HCC)   . Tobacco abuse   . CAD (coronary artery disease) 02/07/2018  . Acute systolic CHF (congestive heart failure) (HCC) 02/07/2018  . Ischemic cardiomyopathy 02/07/2018  . Type II diabetes mellitus (HCC) 02/07/2018  . Hyperlipidemia LDL goal <70 02/07/2018  . COPD exacerbation (HCC) 02/05/2018  . NSTEMI (non-ST elevated myocardial infarction) (HCC) 02/05/2018    Past Surgical History:  Procedure Laterality Date  . ABDOMINAL AORTOGRAM N/A 03/04/2019  Procedure: ABDOMINAL AORTOGRAM;  Surgeon: Iran Ouch, MD;  [facility];  Service: Cardiovascular;  Laterality: N/A;  . AORTIC VALVE REPLACEMENT N/A 02/19/2018   Procedure: AORTIC VALVE REPLACEMENT (AVR);  Surgeon: Purcell Nails, MD;  Location: [facility] OR;  Service: Open Heart Surgery;  Laterality: N/A;  . COLONOSCOPY    . CORONARY ANGIOPLASTY WITH STENT PLACEMENT  ~ 1998/1999  . CORONARY ARTERY BYPASS GRAFT N/A 02/19/2018   Procedure:  CORONARY ARTERY BYPASS GRAFTING (CABG) x three , using left internal mammary artery and right leg greater saphenous vein harvested endoscopically;  Surgeon: Purcell Nails, MD;  Location[facility];  Service: Open Heart Surgery;  Laterality: N/A;  . ESOPHAGOGASTRODUODENOSCOPY (EGD) WITH PROPOFOL N/A 03/18/2018   Procedure: ESOPHAGOGASTRODUODENOSCOPY (EGD) WITH PROPOFOL;  Surgeon: Midge Minium, MD;  Location: [facility] ENDOSCOPY;  Service: Endoscopy;  Laterality: N/A;  . LEFT HEART CATH AND CORONARY ANGIOGRAPHY N/A 02/06/2018   Procedure: LEFT HEART CATH AND CORONARY ANGIOGRAPHY;  Surgeon: Iran Ouch, MD;  Location: [facility] INVASIVE CV LAB;  Service: Cardiovascular;  Laterality: N/A;  . LOWER EXTREMITY ANGIOGRAPHY Bilateral 03/04/2019   Procedure: Lower Extremity Angiography;  Surgeon: Iran Ouch, MD;  Location: [facility] INVASIVE CV LAB;  Service: Cardiovascular;  Laterality: Bilateral;  . PERIPHERAL VASCULAR ATHERECTOMY Left 03/04/2019   Procedure: PERIPHERAL VASCULAR ATHERECTOMY;  Surgeon: Iran Ouch, MD;  Location: [facility]VAS[facility] CV LAB;  Service: Cardiovascular;  Laterality: Left;  SFA  . TEE WITHOUT CARDIOVERSION N/A 02/19/2018   Procedure: TRANSESOPHAGEAL ECHOCARDIOGRAM (TEE);  Surgeon: Purcell Nails, MD;  Location: [facility] OR;  Service: Open Heart Surgery;  Laterality: N/A;  . TONSILLECTOMY      Prior to Admission medications   Medication Sig Start Date End Date Taking? Authorizing Provider  acetaminophen (TYLENOL) 500 MG tablet Take 1,000 mg by mouth daily as needed for moderate pain or headache.    [provider]  ALPRAZolam Prudy Feeler) 0.5 MG tablet Take 0.5 mg by mouth 2 (two) times daily.     [provider]  amLODipine (NORVASC) 5 MG tablet Take 5 mg by mouth daily.    [provider]  Ascorbic Acid (VITAMIN C) 1000 MG tablet Take 1,000 mg by mouth daily.    [provider]  aspirin EC 81 MG tablet Take 81 mg by mouth daily.    [provider]   atorvastatin (LIPITOR) 80 MG tablet Take 80 mg by mouth daily.    [provider]  BREZTRI AEROSPHERE 160-9-4.8 MCG/ACT AERO TAKE 2 PUFFS BY MOUTH TWICE A DAY (STOP TRELEGY) 12/06/20   Salena Saner, MD  esomeprazole (NEXIUM) 40 MG capsule Take 40 mg by mouth at bedtime.     [provider]  ezetimibe (ZETIA) 10 MG tablet Take 10 mg by mouth at bedtime.     [provider]  folic acid (FOLVITE) 400 MCG tablet Take 400 mcg by mouth every evening.    [provider]  furosemide (LASIX) 40 MG tablet TAKE 1 TABLET BY MOUTH EVERY DAY 12/19/20   Iran Ouch, MD  glipiZIDE (GLUCOTROL) 5 MG tablet Take 0.5 tablets (2.5 mg total) by mouth daily before breakfast. Patient taking differently: Take 5 mg by mouth See admin instructions. Take 5 mg in the morning and take a second 5 mg dose at night on Mon, Wed, and Fri 02/27/18   Conte, Tessa N, PA-C  ipratropium-albuterol (DUONEB) 0.5-2.5 (3) MG/3ML SOLN Take 3 mLs by nebulization every 6 (six) hours  as needed. Patient taking differently: Take 3 mLs by nebulization every 6 (six) hours as needed (shortness of breath).  02/03/18   Emily Filbert, MD  losartan (COZAAR) 100 MG tablet TAKE 1 TABLET BY MOUTH EVERY DAY Please call to schedule appointment for further refills. Thank you! Patient taking differently: 50 mg. TAKE 1 TABLET BY MOUTH EVERY DAY Please call to schedule appointment for further refills. Thank you! 01/25/20   Iran Ouch, MD  Melatonin 10 MG CAPS Take 10 mg by mouth at bedtime.    [provider]  metFORMIN (GLUCOPHAGE) 1000 MG tablet TAKE 1 TABLET BY MOUTH TWICE DAILY WITH A MEAL Patient taking differently: Take 1,000 mg by mouth 2 (two) times daily.  04/27/18   Kerin Perna, MD  metoprolol succinate (TOPROL-XL) 100 MG 24 hr tablet TAKE 1 TABLET BY MOUTH EVERY DAY WITH OR IMMEDIATELY FOLLOWING A MEAL 09/25/19   Iran Ouch, MD  PROAIR HFA 108 [phone] Base) MCG/ACT inhaler INL 2 PFS  PO Q 6 H PRN 02/04/19   [provider]  Vitamin D, Cholecalciferol, 1000 units TABS Take 1,000 Units by mouth every morning.     [provider]    Allergies Atorvastatin  Family History  Problem Relation Age of Onset  . Lymphoma Mother   . Peripheral vascular disease Father     Social History Social History   Tobacco Use  . Smoking status: Former Smoker    Packs/day: 1.00    Years: 40.00    Pack years: 40.00    Types: Cigarettes    Quit date: 02/02/2018    Years since quitting: 2.9  . Smokeless tobacco: Never Used  . Tobacco comment: Quit 01/2018 - on 05/01/2018 talked to Wynter about Relaspe concerns. He ststaes that he has no taste for tobacco since he quit in Feb.  Vaping Use  . Vaping Use: Never used  Substance Use Topics  . Alcohol use: Yes    Comment: occ  . Drug use: No    Review of Systems  Constitutional: No fever/chills Eyes: No visual changes. ENT: No sore throat. Cardiovascular: Denies chest pain. Respiratory: Positive shortness of breath and nonproductive cough. Gastrointestinal: No abdominal pain.  No nausea, no vomiting.  No diarrhea.  No constipation. Genitourinary: Negative for dysuria. Musculoskeletal: Negative for back pain. Skin: Negative for rash. Neurological: Negative for headaches, focal weakness or numbness.  ____________________________________________   PHYSICAL EXAM:  VITAL SIGNS: Vitals:   01/11/21 0644 01/11/21 0645  BP:    Pulse: 86 88  Resp: (!) 31 (!) 23  Temp:    SpO2: (!) 87% 91%     Constitutional: Alert and oriented.  Anxious and dyspneic and tachypneic. Eyes: Conjunctivae are normal. PERRL. EOMI. Head: Atraumatic. Nose: No congestion/rhinnorhea. Mouth/Throat: Mucous membranes are dry.  Oropharynx non-erythematous. Neck: No stridor. No cervical spine tenderness to palpation. Cardiovascular: Tachycardic rate, regular rhythm. Grossly normal heart sounds.  Good peripheral circulation. Respiratory:  Tachypneic to about 30.  Diffuse expiratory wheezes with mild decreased air movement throughout. Gastrointestinal: Soft , nondistended, nontender to palpation. No CVA tenderness. Musculoskeletal: No lower extremity tenderness nor edema.  No joint effusions. No signs of acute trauma. Neurologic:  Normal speech and language. No gross focal neurologic deficits are appreciated. No gait instability noted. Skin:  Skin is warm, dry and intact. No rash noted. Psychiatric: Mood and affect are normal. Speech and behavior are normal.  ____________________________________________   LABS (all labs ordered are listed, but only abnormal  results are displayed)  Labs Reviewed  COMPREHENSIVE METABOLIC PANEL - Abnormal; Notable for the following components:      Result Value   Potassium 5.6 (*)    CO2 20 (*)    Glucose, Bld 170 (*)    BUN 43 (*)    Creatinine, Ser 1.64 (*)    GFR, Estimated 43 (*)    All other components within normal limits  POC SARS CORONAVIRUS 2 AG -  ED - Abnormal; Notable for the following components:   SARS Coronavirus 2 Ag Positive (*)    All other components within normal limits  TROPONIN I (HIGH SENSITIVITY) - Abnormal; Notable for the following components:   Troponin I (High Sensitivity) 21 (*)    All other components within normal limits  CBC WITH DIFFERENTIAL/PLATELET  BRAIN NATRIURETIC PEPTIDE   ____________________________________________  12 Lead EKG  Sinus rhythm, rate of 99 bpm.  Normal axis.  Right bundle branch block.  No evidence of acute ischemia. ____________________________________________  RADIOLOGY  ED MD interpretation: 1 view CXR reviewed by me, s/p CABG, no evidence of acute cardiopulmonary pathology.  Official radiology report(s): DG Chest Portable 1 View  Result Date: 01/11/2021 CLINICAL DATA:  COVID pneumonia, COPD, progressive dyspnea EXAM: PORTABLE CHEST 1 VIEW COMPARISON:  04/07/2018 FINDINGS: The lungs are symmetrically well expanded. Mild  interstitial prominence at the lung bases is likely chronic in nature, but better appreciated on the current examination. No pneumothorax or pleural effusion. Aortic valve replacement and coronary artery bypass grafting has been performed. Cardiac size is within normal limits. Pulmonary vascularity is normal. No acute bone abnormality. IMPRESSION: No active disease. Electronically Signed   By: Helyn Numbers MD   On: 01/11/2021 06:18    ____________________________________________   PROCEDURES and INTERVENTIONS  Procedure(s) performed (including Critical Care):  .1-3 Lead EKG Interpretation Performed by: Delton Prairie, MD Authorized by: Delton Prairie, MD     Interpretation: abnormal     ECG rate:  106   ECG rate assessment: tachycardic     Rhythm: sinus tachycardia     Ectopy: none     Conduction: normal   .Critical Care Performed by: Delton Prairie, MD Authorized by: Delton Prairie, MD   Critical care provider statement:    Critical care time (minutes):  35   Critical care was necessary to treat or prevent imminent or life-threatening deterioration of the following conditions:  Respiratory failure   Critical care was time spent personally by me on the following activities:  Discussions with consultants, evaluation of patient's response to treatment, examination of patient, ordering and performing treatments and interventions, ordering and review of laboratory studies, ordering and review of radiographic studies, pulse oximetry, re-evaluation of patient's condition, obtaining history from patient or surrogate and review of old charts    Medications  ipratropium-albuterol (DUONEB) 0.5-2.5 (3) MG/3ML nebulizer solution 6 mL (6 mLs Nebulization Given 01/11/21 0604)  lactated ringers bolus 1,000 mL (1,000 mLs Intravenous New Bag/Given 01/11/21 0601)  acetaminophen (TYLENOL) tablet 1,000 mg (1,000 mg Oral Given 01/11/21 0602)    ____________________________________________   MDM / ED  COURSE   75 year old male with COPD presents to the ED acutely short of breath with evidence of COPD exacerbation on top of COVID-19, with associated hypoxia, and requiring medical admission.  Low-grade temperature on arrival and becomes hypoxic on his home 2 L O2, requiring 4 L O2.  Remains hemodynamically stable.  Exam with stigmata of dehydration, tachypnea and dyspnea.  No evidence of neurovascular deficits, trauma.  He is quite anxious, but with linear thought processes and no signs of psychiatric emergency.  Blood work with AKI.  EKG is nonischemic.  Marginal troponin elevation likely due to his respiratory effort.  He was provided steroids with EMS.  We will admit to hospitalist medicine for further work-up and management.   Clinical Course as of 01/11/21 0650  Wed Jan 11, 2021  [id number] Reassessed.  Patient hypoxic on his 2 L home O2 to 87/88%.  Increase to 4 L with resolution.  We will discussed the case with hospitalist for admission. [DS]    Clinical Course User Index [DS] Delton Prairie, MD    ____________________________________________   FINAL CLINICAL IMPRESSION(S) / ED DIAGNOSES  Final diagnoses:  Acute hypoxemic respiratory failure due to COVID-19 (HCC)  Hypoxia  SOB (shortness of breath)  AKI (acute kidney injury) [facility])     ED Discharge Orders    None       Addilyn Satterwhite   Note:  This document was prepared using Dragon voice recognition software and may include unintentional dictation errors.   Delton Prairie, MD 01/11/21 [phone]

## 2021-01-11 NOTE — ED Triage Notes (Addendum)
Pt c/o worsening SOB tonight, sts he has been feeling bad for a few days. Tested positive for Covid on 1/19 and sts he has been on oxygen at home at night prior to having Covid. Pt also notes fever and diarrhea. Denies N/V. Pt tachypneic on assessment.  Pt reports hx of panic attacks.

## 2021-01-11 NOTE — ED Notes (Signed)
Pt calm and resting with eyes closed, rise and fall of chest noted, bed locked and low, call light in reach.

## 2021-01-11 NOTE — ED Notes (Signed)
Pt feeling better after breathing tx. Less anxious at this time.

## 2021-01-11 NOTE — ED Notes (Signed)
MD notified of BGL 444, patient to be given 9 unit SQ and he will add long acting insulin to patients orders

## 2021-01-11 NOTE — Progress Notes (Signed)
Remdesivir - Pharmacy Brief Note   O:  ALT: 35 CXR: No active disease. SpO2: 93% on 3L   A/P:  Remdesivir 200 mg IVPB once followed by 100 mg IVPB daily x 4 days.  Harold Parker, [facility] 01/11/2021 7:26 AM

## 2021-01-11 NOTE — ED Notes (Signed)
Pt resting in bed, states he feels very anxious, takes xanax at home. Mouth breathing and sob, pt encouraged to breathe in thru his nose, pt verbalized understanding. 3L [place] intact sats 90's. Pt changed into gown. Meds given as ordered. Pt comfortable and breathing more controlled. Call light in reach, bed locked and low.

## 2021-01-11 NOTE — H&P (Signed)
History and Physical    Harold Parker HWT:[id number] DOB: [birth year] DOA: 01/11/2021  Referring MD/NP/PA:   PCP: Merlene Laughter, MD   Patient coming from:   coming from home.  At baseline, pt is independent for most of ADL.        Chief Complaint: SOB, fever  HPI: Harold Parker is a 75 y.o. male with medical history significant of COPD on 2 L home O2, CAD, HTN, HLD, DM, 2019 bioprosthetic valve replacement for aortic stenosis, s/p CABG, anxiety, GI bleeding, duodenal ulcer, bilateral carotid bruit, sCHF with EF of 45-50%, CKD-2, who presents with SOB, fever.  Patient states that he has been sick for almost 1 week, he has fever, chills, dry cough, shortness of breath, watery diarrhea. Patient reports increasing shortness of breath since last night. Has mild chest discomfort. No nausea, vomiting, abdominal pain. No symptoms of UTI or unilateral weakness. Pt states that he had positive covid19 test on 01/04/21. Pt has oxygen desaturation to 87% on room air. Patient is on 2 L oxygen at home, currently on 3 L oxygen with saturation 93-94%.   ED Course: pt was found to have positive covid19 Ag test, WBC 8.2, troponin 21, BNP 181, worsening renal function, potassium 5.6, temperature 100.8, blood pressure 117/83, heart rate 84, RR 31, 23, chest x-ray negative for infiltration. Patient is admitted to MedSurg bed as inpatient.  Review of Systems:   General: has fevers, chills, no body weight gain, has fatigue HEENT: no blurry vision, hearing changes or sore throat Respiratory: has dyspnea, coughing, no wheezing CV: no chest pain, no palpitations GI: no nausea, vomiting, abdominal pain, has diarrhea, no constipation GU: no dysuria, burning on urination, increased urinary frequency, hematuria  Ext: no leg edema Neuro: no unilateral weakness, numbness, or tingling, no vision change or hearing loss Skin: no rash, no skin tear. MSK: No muscle spasm, no deformity, no limitation of range of movement  in spin Heme: No easy bruising.  Travel history: No recent long distant travel.  Allergy:  Allergies  Allergen Reactions  . Atorvastatin Other (See Comments)    Leg aches and weakness     Past Medical History:  Diagnosis Date  . Bilateral carotid bruits    a. 01/2018 U/S: < 50% bilat ICA stenosis.  Marland Kitchen CAD (coronary artery disease)    a. 1998 s/p MI and BMS [facility], [place]); b. 1999 redo PCI/rotablator in setting of what sounds like ISR;  c. Multiple stress tests over the years - last ~ 2017, reportedly nl; d. 01/2018 NSTEMI/Cath: LM 55m/d, LAD 50p, 40p/m, D1 60ost, OM1 95, RCA 100ost/p w/ L->R collats, EF 45%; e. s/p 3V CABG 02/19/18 (LIMA-LAD, VG-D1, VG-OM)  . Chronic lower back pain   . COPD (chronic obstructive pulmonary disease) (HCC)   . GIB (gastrointestinal bleeding)    a. 02/2018 GIB and anemia w/ Hgb of 4.7 on presentation; b. 03/2018 EGD: 2 nonbleeding duodenal ulcers.  Marland Kitchen HTN (hypertension)   . Hypercholesteremia   . Ischemic cardiomyopathy    a. 01/2018 Echo: EF 40-45%, mid-apicalanteroseptal, ant, apical sev HK, mod apicalinferior HK. Gr2 DD. Mod AS, mild MR, mod dil LA, PASP .  . Moderate aortic stenosis    a. 01/2018 Echo: Mod AS, mean grad (S) , Valve area (VTI) 1.06 cm^2, (Vmax) 1.27 cm^2; b. 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  . Myocardial infarction (HCC) ~ 1998/1999  . 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 02/19/2018 AVR: 25 mm Edwards Inspiris Resilia stented  bovine pericardial tissue valve  . 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 Tobacco abuse   . Type II diabetes mellitus (HCC)     Past Surgical History:  Procedure Laterality Date  . ABDOMINAL AORTOGRAM N/A 03/04/2019   Procedure: ABDOMINAL AORTOGRAM;  Surgeon: Iran OuchArida, Muhammad A, MD;  [facility]: [facility];  Service: Cardiovascular;  Laterality: N/A;  . AORTIC VALVE REPLACEMENT N/A 02/19/2018   Procedure: AORTIC VALVE  REPLACEMENT (AVR);  Surgeon: Purcell Nailswen, Clarence H, MD;  [facility];  Service: Open Heart Surgery;  Laterality: N/A;  . COLONOSCOPY    . CORONARY ANGIOPLASTY WITH STENT PLACEMENT  ~ 1998/1999  . CORONARY ARTERY BYPASS GRAFT N/A 02/19/2018   Procedure: CORONARY ARTERY BYPASS GRAFTING (CABG) x three , using left internal mammary artery and right leg greater saphenous vein harvested endoscopically;  Surgeon: Purcell Nailswen, Clarence H, MD;  [facility];  Service: Open Heart Surgery;  Laterality: N/A;  . ESOPHAGOGASTRODUODENOSCOPY (EGD) WITH PROPOFOL N/A 03/18/2018   Procedure: ESOPHAGOGASTRODUODENOSCOPY (EGD) WITH PROPOFOL;  Surgeon: Midge MiniumWohl, Darren, MD;  Location: [facility] ENDOSCOPY;  Service: Endoscopy;  Laterality: N/A;  . LEFT HEART CATH AND CORONARY ANGIOGRAPHY N/A 02/06/2018   Procedure: LEFT HEART CATH AND CORONARY ANGIOGRAPHY;  Surgeon: Iran OuchArida, Muhammad A, MD;  Location: [facility] INVASIVE CV LAB;  Service: Cardiovascular;  Laterality: N/A;  . LOWER EXTREMITY ANGIOGRAPHY Bilateral 03/04/2019   Procedure: Lower Extremity Angiography;  Surgeon: Iran OuchArida, Muhammad A, MD;  Location: [facility];  Service: Cardiovascular;  Laterality: Bilateral;  . PERIPHERAL VASCULAR ATHERECTOMY Left 03/04/2019   Procedure: PERIPHERAL VASCULAR ATHERECTOMY;  Surgeon: Iran OuchArida, Muhammad A, MD;  Location: [facility];  Service: Cardiovascular;  Laterality: Left;  SFA  . TEE WITHOUT CARDIOVERSION N/A 02/19/2018   Procedure: TRANSESOPHAGEAL ECHOCARDIOGRAM (TEE);  Surgeon: Purcell Nailswen, Clarence H, MD;  [facility];  Service: Open Heart Surgery;  Laterality: N/A;  . TONSILLECTOMY      Social History:  reports that he quit smoking about 2 years ago. His smoking use included cigarettes. He has a 40.00 pack-year smoking history. He has never used smokeless tobacco. He reports current alcohol use. He reports that he does not use drugs.  Family History:  Family History  Problem Relation Age of Onset  . Lymphoma Mother   . Peripheral vascular  disease Father      Prior to Admission medications   Medication Sig Start Date End Date Taking? Authorizing Provider  acetaminophen (TYLENOL) 500 MG tablet Take 1,000 mg by mouth daily as needed for moderate pain or headache.    [provider]  ALPRAZolam Prudy Feeler(XANAX) 0.5 MG tablet Take 0.5 mg by mouth 2 (two) times daily.     [provider]  amLODipine (NORVASC) 5 MG tablet Take 5 mg by mouth daily.    [provider]  Ascorbic Acid (VITAMIN C) 1000 MG tablet Take 1,000 mg by mouth daily.    [provider]  aspirin EC 81 MG tablet Take 81 mg by mouth daily.    [provider]  atorvastatin (LIPITOR) 80 MG tablet Take 80 mg by mouth daily.    [provider]  BREZTRI AEROSPHERE 160-9-4.8 MCG/ACT AERO TAKE 2 PUFFS BY MOUTH TWICE A DAY (STOP TRELEGY) 12/06/20   Salena SanerGonzalez, Carmen L, MD  esomeprazole (NEXIUM) 40 MG capsule Take 40 mg by mouth at bedtime.     [provider]  ezetimibe (ZETIA) 10 MG tablet Take 10 mg by mouth at bedtime.     [provider]  folic acid (FOLVITE) 400 MCG tablet Take 400 mcg by mouth every evening.    [provider]  furosemide (LASIX) 40 MG tablet TAKE 1 TABLET BY MOUTH EVERY DAY 12/19/20   Iran Ouch, MD  glipiZIDE (GLUCOTROL) 5 MG tablet Take 0.5 tablets (2.5 mg total) by mouth daily before breakfast. Patient taking differently: Take 5 mg by mouth See admin instructions. Take 5 mg in the morning and take a second 5 mg dose at night on Mon, Wed, and Fri 02/27/18   Conte, Tessa N, PA-C  ipratropium-albuterol (DUONEB) 0.5-2.5 (3) MG/3ML SOLN Take 3 mLs by nebulization every 6 (six) hours as needed. Patient taking differently: Take 3 mLs by nebulization every 6 (six) hours as needed (shortness of breath).  02/03/18   Emily Filbert, MD  losartan (COZAAR) 100 MG tablet TAKE 1 TABLET BY MOUTH EVERY DAY Please call to schedule appointment for further refills. Thank you! Patient taking  differently: 50 mg. TAKE 1 TABLET BY MOUTH EVERY DAY Please call to schedule appointment for further refills. Thank you! 01/25/20   Iran Ouch, MD  Melatonin 10 MG CAPS Take 10 mg by mouth at bedtime.    [provider]  metFORMIN (GLUCOPHAGE) 1000 MG tablet TAKE 1 TABLET BY MOUTH TWICE DAILY WITH A MEAL Patient taking differently: Take 1,000 mg by mouth 2 (two) times daily.  04/27/18   Kerin Perna, MD  metoprolol succinate (TOPROL-XL) 100 MG 24 hr tablet TAKE 1 TABLET BY MOUTH EVERY DAY WITH OR IMMEDIATELY FOLLOWING A MEAL 09/25/19   Iran Ouch, MD  PROAIR HFA 108 [phone] Base) MCG/ACT inhaler INL 2 PFS PO Q 6 H PRN 02/04/19   [provider]  Vitamin D, Cholecalciferol, 1000 units TABS Take 1,000 Units by mouth every morning.     [provider]    Physical Exam: Vitals:   01/11/21 1240 01/11/21 1300 01/11/21 1355 01/11/21 1500  BP: (!) 141/55 (!) 125/100 123/85 (!) 117/59  Pulse: 88 90 88 75  Resp: (!) 24  (!) 26 (!) 24  Temp:      TempSrc:      SpO2: 95% 91% 90% 93%  Weight:      Height:       General: Not in acute distress HEENT:       Eyes: PERRL, EOMI, no scleral icterus.       ENT: No discharge from the ears and nose, no pharynx injection, no tonsillar enlargement.        Neck: No JVD, no mass felt. Heme: No neck lymph node enlargement. Cardiac: S1/S2, RRR, No gallops or rubs. Respiratory: has coarse breathing sound laterally GI: Soft, nondistended, nontender, no rebound pain, no organomegaly, BS present. GU: No hematuria Ext: No pitting leg edema bilaterally. 1+DP/PT pulse bilaterally. Musculoskeletal: No joint deformities, No joint redness or warmth, no limitation of ROM in spin. Skin: No rashes.  Neuro: Alert, oriented X3, cranial nerves II-XII grossly intact, moves all extremities normally.  Psych: Patient is not psychotic, no suicidal or hemocidal ideation.  Labs on Admission: I have personally reviewed following labs and imaging  studies  CBC: Recent Labs  Lab 01/11/21 0538  WBC 8.2  NEUTROABS 5.7  HGB 16.5  HCT 49.6  MCV 91.7  PLT 160   Basic Metabolic Panel: Recent Labs  Lab 01/11/21 0538  NA 135  K  5.6*  CL 104  CO2 20*  GLUCOSE 170*  BUN 43*  CREATININE 1.64*  CALCIUM 9.1   GFR: Estimated Creatinine Clearance: 37.8 mL/min (A) (by C-G formula based on SCr of 1.64 mg/dL (H)). Liver Function Tests: Recent Labs  Lab 01/11/21 0538  AST 29  ALT 35  ALKPHOS 74  BILITOT 0.9  PROT 7.5  ALBUMIN 4.1   No results for input(s): LIPASE, AMYLASE in the last 168 hours. No results for input(s): AMMONIA in the last 168 hours. Coagulation Profile: No results for input(s): INR, PROTIME in the last 168 hours. Cardiac Enzymes: No results for input(s): CKTOTAL, CKMB, CKMBINDEX, TROPONINI in the last 168 hours. BNP (last 3 results) No results for input(s): PROBNP in the last 8760 hours. HbA1C: No results for input(s): HGBA1C in the last 72 hours. CBG: Recent Labs  Lab 01/11/21 0842 01/11/21 1237  GLUCAP 181* 300*   Lipid Profile: Recent Labs    01/11/21 1215  TRIG 55   Thyroid Function Tests: No results for input(s): TSH, T4TOTAL, FREET4, T3FREE, THYROIDAB in the last 72 hours. Anemia Panel: Recent Labs    01/11/21 1215  FERRITIN 171   Urine analysis: No results found for: COLORURINE, APPEARANCEUR, LABSPEC, PHURINE, GLUCOSEU, HGBUR, BILIRUBINUR, KETONESUR, PROTEINUR, UROBILINOGEN, NITRITE, LEUKOCYTESUR Sepsis Labs: @LABRCNTIP (procalcitonin:4,lacticidven:4) )No results found for this or any previous visit (from the past 240 hour(s)).   Radiological Exams on Admission: DG Chest Portable 1 View  Result Date: 01/11/2021 CLINICAL DATA:  COVID pneumonia, COPD, progressive dyspnea EXAM: PORTABLE CHEST 1 VIEW COMPARISON:  04/07/2018 FINDINGS: The lungs are symmetrically well expanded. Mild interstitial prominence at the lung bases is likely chronic in nature, but better appreciated on the  current examination. No pneumothorax or pleural effusion. Aortic valve replacement and coronary artery bypass grafting has been performed. Cardiac size is within normal limits. Pulmonary vascularity is normal. No acute bone abnormality. IMPRESSION: No active disease. Electronically Signed   By: 04/09/2018 MD   On: 01/11/2021 06:18     EKG: I have personally reviewed. Sinus rhythm, QTC 491, RAD, right bundle blockade which is old  Assessment/Plan Principal Problem:   Acute hypoxemic respiratory failure due to COVID-19 [facility]) Active Problems:   CAD (coronary artery disease)   Type II diabetes mellitus with renal manifestations (HCC)   Hyperlipidemia LDL goal <70   HTN (hypertension)   Chronic systolic CHF (congestive heart failure) (HCC)   COPD (chronic obstructive pulmonary disease) (HCC)   GERD (gastroesophageal reflux disease)   Anxiety   Acute renal failure superimposed on stage 2 chronic kidney disease (HCC)   Hyperkalemia   Elevated troponin   Acute hypoxemic respiratory failure due to COVID-19 [facility]): Patient has oxygen desaturation to 87% on room air, currently on 3 L oxygen.  Chest x-ray negative.  -will admit to med-surg bed as inpt -Remdesivir per pharm -Solumedrol 40 mg bid -vitamin C, zinc.  -Bronchodilators -PRN Mucinex for cough -f/u Blood culture -Gentle IV fluid:  -D-dimer, BNP,Trop, LFT, CRP, LDH, Procalcitonin, Ferritin, fibinogen, Hep B SAg -Daily CRP, Ferritin, D-dimer, -Will ask the patient to maintain an awake prone position for 16+ hours a day, if possible, with a minimum of 2-3 hours at a time -Will attempt to maintain euvolemia to a net negative fluid status -IF patient deteriorates, will consult PCCM and ID  CAD (coronary artery disease) and  Elevated troponin: trop 21 -->30.  Likely due to demand ischemia -Continue aspirin, Zetia, metoprolol -Trend troponin -Check A1c, FLP  Type II diabetes  mellitus with renal manifestations (HCC): A1c 7.0  recently, not well controlled.  Patient is taking Metformin and glipizide at home.  Initially patient had blood sugar 170 albumin, but later on increased to 440 which is likely due to steroid use -SSI - start lantus 8 unit daily  Hyperlipidemia LDL goal <70 -Zetia  HTN (hypertension) -IV hydralazine as needed -Continue home amlodipine -Hold Cozaar due to worsening renal function  Chronic systolic CHF (congestive heart failure) (HCC): 2D echo on 09/20/2020 showed EF 45-50%.  Patient does not have pulmonary edema by CXR. BNP 181.  CHF seem to be compensated. -Hold Lasix due to worsening renal function  COPD (chronic obstructive pulmonary disease) (HCC) -Bronchodilators  GERD (gastroesophageal reflux disease) -Protonix  Anxiety -Continue Xanax  Acute renal failure superimposed on stage 2 chronic kidney disease (HCC) -Hold Cozaar and Lasix -Patient received 1 L LR in ED  Hyperkalemia: Potassium 5.6. -Treated with D50 and 5 units of NovoLog -10 g of Lokelma          DVT ppx: SQ Lovenox Code Status: Full code Family Communication:  Yes, patient's significant other by phone  Disposition Plan:  Anticipate discharge back to previous environment Consults called:  None Admission status and Level of care: Med-Surg:    Med-surg bed for obs     Status is: Inpatient  Remains inpatient appropriate because:Inpatient level of care appropriate due to severity of illness.  Patient has multiple comorbidities, now presents with Acute hypoxemic respiratory failure due to COVID-19.  Patient also has elevated troponin, hyperkalemia, worsening renal function.  His presentation is highly complicated.  Patient is at high risk of deteriorating.  Need to be treated in hospital for at least 2 days   Dispo: The patient is from: Home              Anticipated d/c is to: Home              Anticipated d/c date is: 2 days              Patient currently is not medically stable to d/c.   Difficult  to place patient No           Date of Service 01/11/2021    Lorretta Harp [facility]   If 7PM-7AM, please contact night-coverage [email or web address] 01/11/2021, 5:16 PM

## 2021-01-11 NOTE — Progress Notes (Deleted)
Pt present with ecchymosis in which appear to multiple stages of healing in multiple areas of the body to include the left hip, inner thighs and right eye. Hip has multiple bruises some of which is yellow in coloring and some of which is purple in coloring. When turning assessing pt tp assess. Pt teared up and asked NT if she was "going to hurt him?" 

## 2021-01-11 NOTE — ED Notes (Signed)
Patient reports he feels like he is having a panic attack, patient given xanax

## 2021-01-11 NOTE — ED Notes (Signed)
Dr Niu at bedside 

## 2021-01-12 ENCOUNTER — Telehealth: Payer: Self-pay | Admitting: Pulmonary Disease

## 2021-01-12 ENCOUNTER — Telehealth: Payer: Self-pay | Admitting: Cardiovascular Disease

## 2021-01-12 DIAGNOSIS — N179 Acute kidney failure, unspecified: Secondary | ICD-10-CM | POA: Diagnosis not present

## 2021-01-12 DIAGNOSIS — I5022 Chronic systolic (congestive) heart failure: Secondary | ICD-10-CM | POA: Diagnosis not present

## 2021-01-12 DIAGNOSIS — J449 Chronic obstructive pulmonary disease, unspecified: Secondary | ICD-10-CM | POA: Diagnosis not present

## 2021-01-12 DIAGNOSIS — U071 COVID-19: Secondary | ICD-10-CM | POA: Diagnosis not present

## 2021-01-12 LAB — CBC WITH DIFFERENTIAL/PLATELET
Abs Immature Granulocytes: 0.05 10*3/uL (ref 0.00–0.07)
Basophils Absolute: 0 10*3/uL (ref 0.0–0.1)
Basophils Relative: 0 %
Eosinophils Absolute: 0 10*3/uL (ref 0.0–0.5)
Eosinophils Relative: 0 %
HCT: 43.3 % (ref 39.0–52.0)
Hemoglobin: 14.6 g/dL (ref 13.0–17.0)
Immature Granulocytes: 1 %
Lymphocytes Relative: 15 %
Lymphs Abs: 1.2 10*3/uL (ref 0.7–4.0)
MCH: 30.4 pg (ref 26.0–34.0)
MCHC: 33.7 g/dL (ref 30.0–36.0)
MCV: 90.2 fL (ref 80.0–100.0)
Monocytes Absolute: 0.3 10*3/uL (ref 0.1–1.0)
Monocytes Relative: 3 %
Neutro Abs: 6.3 10*3/uL (ref 1.7–7.7)
Neutrophils Relative %: 81 %
Platelets: 164 10*3/uL (ref 150–400)
RBC: 4.8 MIL/uL (ref 4.22–5.81)
RDW: 14.1 % (ref 11.5–15.5)
WBC: 7.8 10*3/uL (ref 4.0–10.5)
nRBC: 0 % (ref 0.0–0.2)

## 2021-01-12 LAB — GLUCOSE, CAPILLARY
Glucose-Capillary: 290 mg/dL — ABNORMAL HIGH (ref 70–99)
Glucose-Capillary: 198 mg/dL — ABNORMAL HIGH (ref 70–99)
Glucose-Capillary: 251 mg/dL — ABNORMAL HIGH (ref 70–99)
Glucose-Capillary: 230 mg/dL — ABNORMAL HIGH (ref 70–99)

## 2021-01-12 LAB — COMPREHENSIVE METABOLIC PANEL
ALT: 30 U/L (ref 0–44)
AST: 27 U/L (ref 15–41)
Albumin: 3.4 g/dL — ABNORMAL LOW (ref 3.5–5.0)
Alkaline Phosphatase: 67 U/L (ref 38–126)
Anion gap: 8 (ref 5–15)
BUN: 60 mg/dL — ABNORMAL HIGH (ref 8–23)
CO2: 20 mmol/L — ABNORMAL LOW (ref 22–32)
Calcium: 8.8 mg/dL — ABNORMAL LOW (ref 8.9–10.3)
Chloride: 105 mmol/L (ref 98–111)
Creatinine, Ser: 1.78 mg/dL — ABNORMAL HIGH (ref 0.61–1.24)
GFR, Estimated: 39 mL/min — ABNORMAL LOW (ref >60)
Glucose, Bld: 351 mg/dL — ABNORMAL HIGH (ref 70–99)
Potassium: 5.9 mmol/L — ABNORMAL HIGH (ref 3.5–5.1)
Sodium: 133 mmol/L — ABNORMAL LOW (ref 135–145)
Total Bilirubin: 0.6 mg/dL (ref 0.3–1.2)
Total Protein: 6.6 g/dL (ref 6.5–8.1)

## 2021-01-12 LAB — MAGNESIUM: Magnesium: 2 mg/dL (ref 1.7–2.4)

## 2021-01-12 LAB — LIPID PANEL
Cholesterol: 90 mg/dL (ref 0–200)
HDL: 28 mg/dL — ABNORMAL LOW (ref >40)
LDL Cholesterol: 45 mg/dL (ref 0–99)
Total CHOL/HDL Ratio: 3.2 RATIO
Triglycerides: 87 mg/dL (ref <150)
VLDL: 17 mg/dL (ref 0–40)

## 2021-01-12 LAB — HEMOGLOBIN A1C
Hgb A1c MFr Bld: 7.5 % — ABNORMAL HIGH (ref 4.8–5.6)
Mean Plasma Glucose: 168.55 mg/dL

## 2021-01-12 LAB — C-REACTIVE PROTEIN: CRP: 6.2 mg/dL — ABNORMAL HIGH (ref <1.0)

## 2021-01-12 LAB — FIBRIN DERIVATIVES D-DIMER (ARMC ONLY): Fibrin derivatives D-dimer (ARMC): 601.56 ng/mL (FEU) — ABNORMAL HIGH (ref 0.00–499.00)

## 2021-01-12 LAB — FERRITIN: Ferritin: 184 ng/mL (ref 24–336)

## 2021-01-12 LAB — POTASSIUM: Potassium: 5.4 mmol/L — ABNORMAL HIGH (ref 3.5–5.1)

## 2021-01-12 MED ORDER — SODIUM ZIRCONIUM CYCLOSILICATE 10 G PO PACK
10.0000 g | PACK | Freq: Three times a day (TID) | ORAL | Status: AC
  Administered 2021-01-12 (×2): 10 g via ORAL
  Filled 2021-01-12 (×2): qty 1

## 2021-01-12 MED ORDER — GUAIFENESIN-DM 100-10 MG/5ML PO SYRP
5.0000 mL | ORAL_SOLUTION | ORAL | Status: DC | PRN
  Administered 2021-01-12 – 2021-01-17 (×9): 5 mL via ORAL
  Filled 2021-01-12 (×9): qty 5

## 2021-01-12 MED ORDER — POLYETHYLENE GLYCOL 3350 17 G PO PACK
17.0000 g | PACK | Freq: Every day | ORAL | Status: DC
  Administered 2021-01-12 – 2021-01-16 (×2): 17 g via ORAL
  Filled 2021-01-12 (×8): qty 1

## 2021-01-12 MED ORDER — INSULIN ASPART 100 UNIT/ML ~~LOC~~ SOLN
4.0000 [IU] | Freq: Three times a day (TID) | SUBCUTANEOUS | Status: DC
  Administered 2021-01-13 – 2021-01-14 (×4): 4 [IU] via SUBCUTANEOUS
  Filled 2021-01-12 (×4): qty 1

## 2021-01-12 MED ORDER — HYDROCOD POLST-CPM POLST ER 10-8 MG/5ML PO SUER
5.0000 mL | Freq: Every evening | ORAL | Status: DC | PRN
  Administered 2021-01-12 – 2021-01-18 (×8): 5 mL via ORAL
  Filled 2021-01-12 (×8): qty 5

## 2021-01-12 MED ORDER — DM-GUAIFENESIN ER 30-600 MG PO TB12
1.0000 | ORAL_TABLET | Freq: Two times a day (BID) | ORAL | Status: DC
  Administered 2021-01-12 – 2021-01-19 (×14): 1 via ORAL
  Filled 2021-01-12 (×15): qty 1

## 2021-01-12 MED ORDER — METHYLPREDNISOLONE SODIUM SUCC 125 MG IJ SOLR
60.0000 mg | Freq: Two times a day (BID) | INTRAMUSCULAR | Status: DC
  Administered 2021-01-12 – 2021-01-14 (×4): 60 mg via INTRAVENOUS
  Filled 2021-01-12 (×4): qty 2

## 2021-01-12 MED ORDER — INSULIN GLARGINE 100 UNIT/ML ~~LOC~~ SOLN
12.0000 [IU] | Freq: Every day | SUBCUTANEOUS | Status: DC
  Administered 2021-01-12 – 2021-01-13 (×2): 12 [IU] via SUBCUTANEOUS
  Filled 2021-01-12 (×3): qty 0.12

## 2021-01-12 MED ORDER — BUTALBITAL-APAP-CAFFEINE 50-325-40 MG PO TABS
1.0000 | ORAL_TABLET | Freq: Four times a day (QID) | ORAL | Status: DC | PRN
  Administered 2021-01-12: 1 via ORAL
  Filled 2021-01-12: qty 1

## 2021-01-12 NOTE — Telephone Encounter (Signed)
DPR on file. Patients fiancee [name] made aware of Dr. Jari Sportsman response. [name] voiced appreciation for the call back.

## 2021-01-12 NOTE — Telephone Encounter (Signed)
I wish him a speedy recovery.  The hospitalist team will consult [place] if there are cardiac issues going on.

## 2021-01-12 NOTE — Telephone Encounter (Signed)
Spoke to patient's spouse, Carol(DPR). [name] wanted to make Dr. Jayme Cloud aware that patient is currently admitted at [facility] with covid.  [name] also stated that patient's voice sounded different when she last spoke with him. Per [name], patient made RN aware of this.   Routing to Dr. Jayme Cloud as an Lorain Childes.

## 2021-01-12 NOTE — Progress Notes (Signed)
OT Cancellation Note  Patient Details Name: Harold Parker MRN: [id number] DOB: 30-Oct-1946   Cancelled Treatment:    Reason Eval/Treat Not Completed: Medical issues which prohibited therapy. Orders obtained and chart reviewed. With potassium 5.9 and above guidelines for therapy, pt is not appropriate at this time. Will follow-up at later time/date when pt is medically ready. Thank you.  Matthew Folks, OTR/L [facility] [phone]

## 2021-01-12 NOTE — Progress Notes (Signed)
PT Cancellation Note  Patient Details Name: [name] MRN: [id number] DOB: 09/06/1946   Cancelled Treatment:    Reason Eval/Treat Not Completed: Medical issues which prohibited therapy (Consult received and chart reviewed.  Patient noted with critically elevated potassium levels (5.9); contraindicated for exertional activity at this time.  Will continue to follow and initiate as medically appropriate.)   Dariona Postma H. Manson Passey, PT, DPT, NCS 01/12/21, 11:16 AM [phone]

## 2021-01-12 NOTE — Progress Notes (Signed)
PROGRESS NOTE    Harold Parker   TKZ:[id number]  DOB: [birth year]  PCP: Lajean Manes, MD    DOA: 01/11/2021 LOS: 1   Brief Narrative   75 y.o. 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  Patient reported he had tested positive for COVID-19 on 1/19.  On arrival patient hypoxic with O2 sat 87% on room air which improved on 3 L/min nasal cannula oxygen.  He tested positive for COVID-19.  Labs were notable for worsening renal function, potassium 5.6.  He met sepsis criteria with he met sepsis criteria with fever 100.8 F, RR 31.   Chest x-ray was negative for infiltrates.  Admitted to hospitalist service for management of acute on chronic respiratory failure with hypoxia due to Covid-19 infection.          Assessment & Plan   Principal Problem:   Acute hypoxemic respiratory failure due to COVID-19 [facility]) Active Problems:   CAD (coronary artery disease)   Type II diabetes mellitus with renal manifestations (HCC)   Hyperlipidemia LDL goal <70   HTN (hypertension)   Chronic systolic CHF (congestive heart failure) (HCC)   COPD (chronic obstructive pulmonary disease) (HCC)   GERD (gastroesophageal reflux disease)   Anxiety   Acute renal failure superimposed on stage 2 chronic kidney disease (HCC)   Hyperkalemia   Elevated troponin   Acute on chronic hypoxic respiratory failure due to COVID-19 infection with secondary acute exacerbation of COPD Patient reported testing positive for COVID-19 on 1/19, confirmed here on admission. Baseline oxygen requirement 2 L/min, reacquiring up to 10 L/min by HFNC when seen this morning and patient significantly dyspneic. Appears  Covid infection has exacerbated his COPD. --Continue remdesivir --Solu-Medrol 60 mg IV BID --Scheduled Mucinex, Tussionex as needed --Continue inhalers per orders --IS and flutter valve --O2 to maintain sats 88-93%, wean as tolerated --follow inflammatory markers, CMP, CBC daily --prone or side laying as much as possible --monitor volume status --will consult pulm if worsening course   Hyperkalemia - POA with K 5.6 >> 5.9 this AM.  Was given Lokelma but no BM yet as pt took Imodium at home before coming in.   --Bowel regimen --Lokelma --Repeat K level later today --Daily BMP's to monitor   AKI superimposed on CKD stage II -presented with creatinine 1.64, prior creatinine ranging from 0.8-1.1 on chart review. Cr trend: 1.64>> 1.70 --Hold losartan and Lasix. --Monitor BMP daily --avoid nephrotoxins and hypotension --renally dose meds as indicated --I/O's and daily weights   Steroid-induced hyperglycemia type 2 diabetes with renal manifestations -A1c 7.0 recently, uncontrolled.  Takes Metformin glipizide at home. CBGs elevated due to IV steroids for Covid and COPD. --Increase Lantus to 12 units daily --add NovoLog 4 units 3 times daily with meals --continue sliding scale NovoLog   Headache -likely due to Covid infection. --Fioricet or Tylenol as needed   Troponin elevation -due to demand ischemia in the setting of worsening hypoxia.  No acute ischemic EKG changes or active chest pain.  Monitor.   Chronic systolic CHF -echo on 57/02/2201 showed EF 45 to 50%.  No pulmonary edema seen on chest x-ray, BNP 181 on admission.  Patient well compensated at this time. --Monitor  volume status closely --hold Lasix and losartan due to worsening renal function --continue metoprolol --I/O's and daily weights  CAD status post CABG -stable.   --Continue aspirin, Zetia, metoprolol.  Hyperlipidemia -continue Zetia.  LDL goal less than 70. Lipid panel this admission: Total cholesterol  70, low LDH 28, LDL 45 at goal, triglycerides 87.  Hypertension -continue home amlodipine.  Hold losartan due to worsening renal function.  IV hydralazine as needed.  COPD -currently with acute exacerbation as above.  Management as above.  GERD -continue Protonix  Anxiety -continue Xanax    Patient BMI: Body mass index is 28.08 kg/m.   DVT prophylaxis: enoxaparin (LOVENOX) injection 40 mg Start: 01/11/21 2200   Diet:  Diet Orders (From admission, onward)    Start     Ordered   01/11/21 1114  Diet Heart Room service appropriate? Yes; Fluid consistency: Thin  Diet effective now       Question Answer Comment  Room service appropriate? Yes   Fluid consistency: Thin      01/11/21 1113            Code Status: Full Code    Subjective 01/12/21    Patient reported feeling little worse today than yesterday.  Says it feels harder to breathe.  Feels short of breath just talking today.  Has a bad headache.  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  Would like Dr. Patsey Berthold to know he is here as he follows with her for COPD.   Disposition Plan & Communication   Status is: Inpatient  Inpatient status remains appropriate due to severity of illness with increasing oxygen requirement today.  On IV therapies for Covid infection as above.  Dispo: The patient is from: Home              Anticipated d/c is to: Home              Anticipated d/c date is: 3 days              Patient currently is not medically stable for discharge   Difficult to place patient no  Family Communication: Attempted to call significant other this evening but got voicemail.  Will attempt to call tomorrow.   Consults, Procedures, Significant Events   Consultants:   None  Procedures:   None  Antimicrobials:  Anti-infectives (From admission, onward)   Start     Dose/Rate Route Frequency Ordered Stop   01/12/21 1000  remdesivir 100 mg in sodium chloride 0.9 % 100 mL  IVPB       "Followed by" Linked Group Details   100 mg 200 mL/hr over 30 Minutes Intravenous Daily 01/11/21 0729 01/16/21 0959   01/11/21 0830  remdesivir 200 mg in sodium chloride 0.9% 250 mL IVPB       "Followed by" Linked Group Details   200 mg 580 mL/hr over 30 Minutes Intravenous Once 01/11/21 0729 01/11/21 1104         Objective   Vitals:   01/12/21 0438 01/12/21 0739 01/12/21 1134 01/12/21 1529  BP: (!) 114/53 (!) 107/48 (!) 117/53 (!) 114/50  Pulse: 69 67 71 68  Resp: $Remo'20 18 18 18  'hsLds$ Temp: 97.8 F (36.6 C) 98.7 F (37.1 C) 98.6 F (37 C) 97.6 F (36.4 C)  TempSrc: Oral Oral Oral Oral  SpO2: 93% 95% 92% 95%  Weight:      Height:        Intake/Output Summary (  Last 24 hours) at 01/12/2021 1941 Last data filed at 01/12/2021 0005 Gross per 24 hour  Intake -  Output 205 ml  Net -205 ml   Filed Weights   01/11/21 0541  Weight: 78.9 kg    Physical Exam:  General exam: awake, alert, no acute distress HEENT: clear conjunctiva, anicteric sclera, moist mucus membranes, hearing grossly normal  Respiratory system: Decreased breath sounds, expiratory wheezes, conversational dyspnea and accessory muscle use, tachypneic, on 10 L/min HFNC oxygen Cardiovascular system: normal S1/S2, RRR, no JVD, murmurs, rubs, gallops, no pedal edema.   Gastrointestinal system: soft, NT, ND Central nervous system: A&O x4. no gross focal neurologic deficits, normal speech Extremities: moves all, no cyanosis, normal tone Skin: dry, intact, normal temperature Psychiatry: normal mood, congruent affect, judgement and insight appear normal  Labs   Data Reviewed: I have personally reviewed following labs and imaging studies  CBC: Recent Labs  Lab 01/11/21 0538 01/12/21 0445  WBC 8.2 7.8  NEUTROABS 5.7 6.3  HGB 16.5 14.6  HCT 49.6 43.3  MCV 91.7 90.2  PLT 160 196   Basic Metabolic Panel: Recent Labs  Lab 01/11/21 0538 01/12/21 0445  NA 135 133*  K 5.6* 5.9*  CL 104 105  CO2 20*  20*  GLUCOSE 170* 351*  BUN 43* 60*  CREATININE 1.64* 1.78*  CALCIUM 9.1 8.8*  MG  --  2.0   GFR: Estimated Creatinine Clearance: 34.9 mL/min (A) (by C-G formula based on SCr of 1.78 mg/dL (H)). Liver Function Tests: Recent Labs  Lab 01/11/21 0538 01/12/21 0445  AST 29 27  ALT 35 30  ALKPHOS 74 67  BILITOT 0.9 0.6  PROT 7.5 6.6  ALBUMIN 4.1 3.4*   No results for input(s): LIPASE, AMYLASE in the last 168 hours. No results for input(s): AMMONIA in the last 168 hours. Coagulation Profile: No results for input(s): INR, PROTIME in the last 168 hours. Cardiac Enzymes: No results for input(s): CKTOTAL, CKMB, CKMBINDEX, TROPONINI in the last 168 hours. BNP (last 3 results) No results for input(s): PROBNP in the last 8760 hours. HbA1C: Recent Labs    01/12/21 0445  HGBA1C 7.5*   CBG: Recent Labs  Lab 01/11/21 1732 01/11/21 2225 01/12/21 0739 01/12/21 1134 01/12/21 1529  GLUCAP 444* 193* 290* 198* 251*   Lipid Profile: Recent Labs    01/11/21 1215 01/12/21 0445  CHOL  --  90  HDL  --  28*  LDLCALC  --  45  TRIG 55 87  CHOLHDL  --  3.2   Thyroid Function Tests: No results for input(s): TSH, T4TOTAL, FREET4, T3FREE, THYROIDAB in the last 72 hours. Anemia Panel: Recent Labs    01/11/21 1215 01/12/21 0445  FERRITIN 171 184   Sepsis Labs: Recent Labs  Lab 01/11/21 1215  PROCALCITON 0.24    Recent Results (from the past 240 hour(s))  Culture, blood (Routine X 2) w Reflex to ID Panel     Status: None (Preliminary result)   Collection Time: 01/11/21 12:25 PM   Specimen: BLOOD  Result Value Ref Range Status   Specimen Description BLOOD RIGHT [facility]  Final   Special Requests   Final    BOTTLES DRAWN AEROBIC AND ANAEROBIC Blood Culture adequate volume   Culture   Final    NO GROWTH < 24 HOURS Performed at [facility], [address] [place], [place] [address]    Report Status PENDING  Incomplete  Culture, blood (Routine X 2) w Reflex to ID Panel  Status: None (Preliminary result)   Collection Time: 01/11/21 12:26 PM   Specimen: BLOOD  Result Value Ref Range Status   Specimen Description BLOOD  LEFT AC  Final   Special Requests   Final    BOTTLES DRAWN AEROBIC AND ANAEROBIC Blood Culture adequate volume   Culture   Final    NO GROWTH < 24 HOURS Performed at [facility], [address] [place], [place] [address]    Report Status PENDING  Incomplete  Culture, sputum-assessment     Status: None   Collection Time: 01/11/21  3:02 PM   Specimen: Expectorated Sputum  Result Value Ref Range Status   Specimen Description EXPECTORATED SPUTUM  Final   Special Requests NONE  Final   Sputum evaluation   Final    THIS SPECIMEN IS ACCEPTABLE FOR SPUTUM CULTURE Performed at [facility], [address], [place], [place] [address]    Report Status 01/11/2021 FINAL  Final  Culture, respiratory     Status: None (Preliminary result)   Collection Time: 01/11/21  3:02 PM  Result Value Ref Range Status   Specimen Description   Final    EXPECTORATED SPUTUM Performed at [facility], [address], [place], [place] [address]    Special Requests   Final    NONE Reflexed from [id number] Performed at [facility], [address] [place], [place] [address]    Gram Stain   Final    NO WBC SEEN MODERATE SQUAMOUS EPITHELIAL CELLS PRESENT ABUNDANT GRAM POSITIVE COCCI MODERATE GRAM NEGATIVE RODS RARE GRAM POSITIVE RODS    Culture   Final    CULTURE REINCUBATED FOR BETTER GROWTH Performed at [facility], [place] [address], [place], [place] [address]    Report Status PENDING  Incomplete      Imaging Studies   DG Chest Portable 1 View  Result Date: 01/11/2021 CLINICAL DATA:  COVID pneumonia, COPD, progressive dyspnea EXAM: PORTABLE CHEST 1 VIEW COMPARISON:  04/07/2018 FINDINGS: The lungs are symmetrically well expanded. Mild interstitial prominence at the lung bases is likely chronic in nature,  but better appreciated on the current examination. No pneumothorax or pleural effusion. Aortic valve replacement and coronary artery bypass grafting has been performed. Cardiac size is within normal limits. Pulmonary vascularity is normal. No acute bone abnormality. IMPRESSION: No active disease. Electronically Signed   By: Fidela Salisbury MD   On: 01/11/2021 06:18     Medications   Scheduled Meds: . amLODipine  5 mg Oral Daily  . arformoterol  15 mcg Nebulization BID  . vitamin C  500 mg Oral Daily  . aspirin EC  81 mg Oral Daily  . cholecalciferol  1,000 Units Oral BH-[id number]  . dextromethorphan-guaiFENesin  1 tablet Oral BID  . enoxaparin (LOVENOX) injection  40 mg Subcutaneous Q24H  . ezetimibe  10 mg Oral QHS  . fluticasone  2 puff Inhalation BID  . folic acid  784 mcg Oral QPM  . insulin aspart  0-5 Units Subcutaneous QHS  . insulin aspart  0-9 Units Subcutaneous TID WC  . [START ON 01/13/2021] insulin aspart  4 Units Subcutaneous TID WC  . insulin glargine  12 Units Subcutaneous QHS  . ipratropium  2 puff Inhalation Q4H  . melatonin  10 mg Oral QHS  . methylPREDNISolone (SOLU-MEDROL) injection  40 mg Intravenous Q12H  . metoprolol succinate  100 mg Oral Daily  . pantoprazole  40 mg Oral Daily  . polyethylene glycol  17  g Oral Daily  . zinc sulfate  220 mg Oral Daily   Continuous Infusions: . remdesivir 100 mg in NS 100 mL 100 mg (01/12/21 0835)       LOS: 1 day    Time spent: 30 minutes with > 50% spent at bedside and in coordination of care.    Ezekiel Slocumb, DO [facility]  01/12/2021, 7:41 PM    If 7PM-7AM, please contact night-coverage. How to contact the [facility] Attending or Consulting provider [place] or covering provider during after hours [place], for this patient?    1. Check the care team in [facility] and look for a) attending/consulting TR[facility] provider listed and b) the [facility] team listed 2. Log into [email or web address] and use [place]'s universal password to  access. If you do not have the password, please contact the hospital operator. 3. Locate the [facility] provider you are looking for under [facility] and page to a number that you can be directly reached. 4. If you still have difficulty reaching the provider, please page the [facility] (Director on Call) for the Hospitalists listed on amion for assistance.

## 2021-01-12 NOTE — Telephone Encounter (Signed)
FYI update fwd to Dr. Arida. 

## 2021-01-12 NOTE — Hospital Course (Addendum)
75 y.o. 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  Patient reported he had tested positive for COVID-19 on 1/19.  On arrival patient hypoxic with O2 sat 87% on room air which improved on 3 L/min nasal cannula oxygen.  He tested positive for COVID-19.  Labs were notable for worsening renal function, potassium 5.6.  He met sepsis criteria with he met sepsis criteria with fever 100.8 F, RR 31.   Chest x-ray was negative for infiltrates.  Admitted to hospitalist service for management of acute on chronic respiratory failure with hypoxia due to Covid-19 infection.

## 2021-01-12 NOTE — Telephone Encounter (Signed)
Noted  

## 2021-01-12 NOTE — Progress Notes (Signed)
Inpatient Diabetes Program Recommendations  AACE/ADA: New Consensus Statement on Inpatient Glycemic Control   Target Ranges:  Prepandial:   less than 140 mg/dL      Peak postprandial:   less than 180 mg/dL (1-2 hours)      Critically ill patients:  140 - 180 mg/dL   Results for DAVONNE, JARNIGAN (MRN [id number]) as of 01/12/2021 09:16  Ref. Range 01/11/2021 08:42 01/11/2021 12:37 01/11/2021 17:32 01/11/2021 22:25 01/12/2021 07:39  Glucose-Capillary Latest Ref Range: 70 - 99 mg/dL 754 (H) 360 (H) 677 (H) 193 (H) 290 (H)   Review of Glycemic Control  Diabetes history: DM2 Outpatient Diabetes medications: Metformin 1000 mg BID, Glipizide 5 mg daily (takes Glipizide 5 mg BID on Monday, Wednesday, Friday) Current orders for Inpatient glycemic control: Lantus 8 units QHS, Novolog 0-9 units TID with meals, Novolog 0-5 units QHS; Solumedrol 40 mg Q12H  Inpatient Diabetes Program Recommendations:    Insulin: If steroids are continued, please consider increasing Lantus to 12 units QHS and ordering Novolog 4 units TID with meals for meal coverage if patient eats at least 50% of meals.  Thanks, Orlando Penner, RN, MSN, CDE Diabetes Coordinator Inpatient Diabetes Program [phone] (Team Pager from 8am to 5pm)

## 2021-01-12 NOTE — Telephone Encounter (Signed)
Patients significant other calling to state patient is admitted to [facility] for covid

## 2021-01-12 NOTE — Plan of Care (Signed)
  Problem: Education: Goal: Knowledge of risk factors and measures for prevention of condition will improve Outcome: Progressing   Problem: Coping: Goal: Psychosocial and spiritual needs will be supported Outcome: Progressing   Problem: Respiratory: Goal: Will maintain a patent airway Outcome: Progressing Goal: Complications related to the disease process, condition or treatment will be avoided or minimized Outcome: Progressing   

## 2021-01-13 DIAGNOSIS — J449 Chronic obstructive pulmonary disease, unspecified: Secondary | ICD-10-CM | POA: Diagnosis not present

## 2021-01-13 DIAGNOSIS — N179 Acute kidney failure, unspecified: Secondary | ICD-10-CM | POA: Diagnosis not present

## 2021-01-13 DIAGNOSIS — I5022 Chronic systolic (congestive) heart failure: Secondary | ICD-10-CM | POA: Diagnosis not present

## 2021-01-13 DIAGNOSIS — U071 COVID-19: Secondary | ICD-10-CM | POA: Diagnosis not present

## 2021-01-13 LAB — COMPREHENSIVE METABOLIC PANEL
ALT: 34 U/L (ref 0–44)
AST: 31 U/L (ref 15–41)
Albumin: 3.3 g/dL — ABNORMAL LOW (ref 3.5–5.0)
Alkaline Phosphatase: 64 U/L (ref 38–126)
Anion gap: 7 (ref 5–15)
BUN: 72 mg/dL — ABNORMAL HIGH (ref 8–23)
CO2: 21 mmol/L — ABNORMAL LOW (ref 22–32)
Calcium: 8.8 mg/dL — ABNORMAL LOW (ref 8.9–10.3)
Chloride: 109 mmol/L (ref 98–111)
Creatinine, Ser: 1.47 mg/dL — ABNORMAL HIGH (ref 0.61–1.24)
GFR, Estimated: 49 mL/min — ABNORMAL LOW (ref >60)
Glucose, Bld: 335 mg/dL — ABNORMAL HIGH (ref 70–99)
Potassium: 5.2 mmol/L — ABNORMAL HIGH (ref 3.5–5.1)
Sodium: 137 mmol/L (ref 135–145)
Total Bilirubin: 0.6 mg/dL (ref 0.3–1.2)
Total Protein: 6.4 g/dL — ABNORMAL LOW (ref 6.5–8.1)

## 2021-01-13 LAB — CBC WITH DIFFERENTIAL/PLATELET
Abs Immature Granulocytes: 0.16 10*3/uL — ABNORMAL HIGH (ref 0.00–0.07)
Basophils Absolute: 0 10*3/uL (ref 0.0–0.1)
Basophils Relative: 0 %
Eosinophils Absolute: 0 10*3/uL (ref 0.0–0.5)
Eosinophils Relative: 0 %
HCT: 42.1 % (ref 39.0–52.0)
Hemoglobin: 14.8 g/dL (ref 13.0–17.0)
Immature Granulocytes: 1 %
Lymphocytes Relative: 6 %
Lymphs Abs: 0.8 10*3/uL (ref 0.7–4.0)
MCH: 31 pg (ref 26.0–34.0)
MCHC: 35.2 g/dL (ref 30.0–36.0)
MCV: 88.3 fL (ref 80.0–100.0)
Monocytes Absolute: 0.6 10*3/uL (ref 0.1–1.0)
Monocytes Relative: 4 %
Neutro Abs: 12 10*3/uL — ABNORMAL HIGH (ref 1.7–7.7)
Neutrophils Relative %: 89 %
Platelets: 180 10*3/uL (ref 150–400)
RBC: 4.77 MIL/uL (ref 4.22–5.81)
RDW: 13.9 % (ref 11.5–15.5)
WBC: 13.6 10*3/uL — ABNORMAL HIGH (ref 4.0–10.5)
nRBC: 0 % (ref 0.0–0.2)

## 2021-01-13 LAB — GLUCOSE, CAPILLARY
Glucose-Capillary: 233 mg/dL — ABNORMAL HIGH (ref 70–99)
Glucose-Capillary: 261 mg/dL — ABNORMAL HIGH (ref 70–99)
Glucose-Capillary: 249 mg/dL — ABNORMAL HIGH (ref 70–99)
Glucose-Capillary: 288 mg/dL — ABNORMAL HIGH (ref 70–99)

## 2021-01-13 LAB — FIBRIN DERIVATIVES D-DIMER (ARMC ONLY): Fibrin derivatives D-dimer (ARMC): 579.46 ng/mL (FEU) — ABNORMAL HIGH (ref 0.00–499.00)

## 2021-01-13 LAB — MAGNESIUM: Magnesium: 2.2 mg/dL (ref 1.7–2.4)

## 2021-01-13 LAB — BRAIN NATRIURETIC PEPTIDE: B Natriuretic Peptide: 591.1 pg/mL — ABNORMAL HIGH (ref 0.0–100.0)

## 2021-01-13 LAB — FERRITIN: Ferritin: 156 ng/mL (ref 24–336)

## 2021-01-13 LAB — C DIFFICILE QUICK SCREEN W PCR REFLEX
C Diff antigen: NEGATIVE
C Diff interpretation: NOT DETECTED
C Diff toxin: NEGATIVE

## 2021-01-13 LAB — C-REACTIVE PROTEIN: CRP: 2.8 mg/dL — ABNORMAL HIGH (ref <1.0)

## 2021-01-13 LAB — PROCALCITONIN: Procalcitonin: 0.16 ng/mL

## 2021-01-13 MED ORDER — SUCRALFATE 1 GM/10ML PO SUSP
1.0000 g | Freq: Three times a day (TID) | ORAL | Status: DC
  Administered 2021-01-13 – 2021-01-24 (×42): 1 g via ORAL
  Filled 2021-01-13 (×42): qty 10

## 2021-01-13 MED ORDER — TRAZODONE HCL 50 MG PO TABS
50.0000 mg | ORAL_TABLET | Freq: Every day | ORAL | Status: DC
  Administered 2021-01-13 – 2021-01-23 (×11): 50 mg via ORAL
  Filled 2021-01-13 (×11): qty 1

## 2021-01-13 MED ORDER — PANTOPRAZOLE SODIUM 40 MG PO TBEC
40.0000 mg | DELAYED_RELEASE_TABLET | Freq: Two times a day (BID) | ORAL | Status: DC
  Administered 2021-01-13 – 2021-01-24 (×23): 40 mg via ORAL
  Filled 2021-01-13 (×23): qty 1

## 2021-01-13 MED ORDER — SODIUM ZIRCONIUM CYCLOSILICATE 10 G PO PACK
10.0000 g | PACK | Freq: Three times a day (TID) | ORAL | Status: AC
  Administered 2021-01-13: 10 g via ORAL
  Filled 2021-01-13: qty 1

## 2021-01-13 MED ORDER — BUDESON-GLYCOPYRROL-FORMOTEROL 160-9-4.8 MCG/ACT IN AERO
1.0000 | INHALATION_SPRAY | Freq: Every day | RESPIRATORY_TRACT | Status: DC
  Filled 2021-01-13: qty 1

## 2021-01-13 NOTE — Progress Notes (Signed)
Breztri inhaler placed in medication bin must be sent home with patient at discharge. Bo Mcclintock, RN

## 2021-01-13 NOTE — Plan of Care (Signed)
Pt signif other [name]  contacted and updated via phone.

## 2021-01-13 NOTE — Care Management Important Message (Signed)
Important Message  Patient Details  Name: Harold Parker MRN: [id number] Date of Birth: 02-09-1946   Medicare Important Message Given:  Yes     Allayne Butcher, RN 01/13/2021, 12:39 PM

## 2021-01-13 NOTE — Progress Notes (Signed)
PROGRESS NOTE    Harold Parker   FGH:[id number]  DOB: 21-Dec-1945  PCP: Lajean Manes, MD    DOA: 01/11/2021 LOS: 2   Brief Narrative / Hospital Course to-date   75 y.o. 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  Patient reported he had tested positive for COVID-19 on 1/19.  On arrival patient hypoxic with O2 sat 87% on room air which improved on 3 L/min nasal cannula oxygen.  He tested positive for COVID-19.  Labs were notable for worsening renal function, potassium 5.6.  He met sepsis criteria with he met sepsis criteria with fever 100.8 F, RR 31.   Chest x-ray was negative for infiltrates.  Admitted to hospitalist service for management of acute on chronic respiratory failure with hypoxia due to Covid-19 infection.    Significant Events: -01/11/21 admitted  -1/27 oxygen need 4 >> 6 L/min, severe dyspnea even at rest & with conversation  Date of +Covid Test: 01/04/21  Vaccination status: 2 doses Pfizer 02/06/20, 02/29/20  Assessment & Plan   Principal Problem:   Acute hypoxemic respiratory failure due to COVID-19 [facility]) Active Problems:   CAD (coronary artery disease)   Type II diabetes mellitus with renal manifestations (HCC)   Hyperlipidemia LDL goal <70   HTN (hypertension)   Chronic systolic CHF (congestive heart failure) (HCC)   COPD (chronic obstructive pulmonary disease) (HCC)   GERD (gastroesophageal reflux disease)   Anxiety   Acute renal failure superimposed on stage 2 chronic kidney disease (HCC)   Hyperkalemia   Elevated troponin   Acute on chronic hypoxic respiratory failure due to COVID-19 infection with secondary acute exacerbation of  COPD Patient reported testing positive for COVID-19 on 1/19, confirmed here on admission. Baseline oxygen requirement 2 L/min, reacquiring up to 10 L/min by HFNC when seen this morning and patient significantly dyspneic. Appears Covid infection has exacerbated his COPD. --Continue remdesivir --Solu-Medrol 60 mg IV BID --Scheduled Mucinex, Tussionex as needed --Continue inhalers per orders --IS and flutter valve --O2 to maintain sats 88-93%, wean as tolerated --follow inflammatory markers, CMP, CBC daily --prone or side laying as much as possible --monitor volume status, goal net negative --> resume Lasix tomorrow if renal function better --will consult pulm if worsening course   Hyperkalemia - POA with K 5.6 >> 5.9 >>5.2 this AM.  Treated with Lokelma. --Daily BMP's to monitor   Odynophagia - trial of Carafate, on PPI will increase to BID for now.   Suspect underlying esophagitis, aggravated by high dose steroids.     AKI superimposed on CKD stage II -presented with creatinine 1.64, prior creatinine ranging from 0.8-1.1 on chart review. Cr trend: 1.64>> 1.78>> 1.47 --Hold losartan and Lasix --Monitor BMP daily --avoid nephrotoxins and hypotension --renally dose meds as indicated --I/O's and daily weights   Steroid-induced hyperglycemia type 2 diabetes with renal manifestations -A1c 7.0 recently, uncontrolled.  Takes Metformin glipizide at home. CBGs elevated due to IV steroids for Covid and COPD. --Increase Lantus to 12 units daily --add NovoLog 4 units 3 times daily with meals --continue sliding scale NovoLog  Headache -likely due to Covid infection. --Fioricet or Tylenol as needed  Troponin elevation -due to demand ischemia in the setting  of worsening hypoxia.  No acute ischemic EKG changes or active chest pain.  Monitor.  Chronic systolic CHF -echo on 16/12/958 showed EF 45 to 50%.  No pulmonary edema seen on chest x-ray, BNP 181 on admission.  Patient well  compensated at this time. --Monitor volume status closely --hold Lasix and losartan due to worsening renal function --continue metoprolol --I/O's and daily weights  CAD status post CABG -stable.   --Continue aspirin, Zetia, metoprolol.  Hyperlipidemia -continue Zetia.  LDL goal less than 70. Lipid panel this admission: Total cholesterol 70, low LDH 28, LDL 45 at goal, triglycerides 87.  Hypertension -continue home amlodipine.  Hold losartan due to worsening renal function.  IV hydralazine as needed.  COPD -currently with acute exacerbation as above.  Management as above.  GERD -continue Protonix  Anxiety with Panic Attacks -continue home Xanax.  Consider starting SSRI, PRN Vistaril for acute panic.  Will discuss with patient.   Patient BMI: Body mass index is 27.83 kg/m.   DVT prophylaxis: enoxaparin (LOVENOX) injection 40 mg Start: 01/11/21 2200   Diet:  Diet Orders (From admission, onward)    Start     Ordered   01/11/21 1114  Diet Heart Room service appropriate? Yes; Fluid consistency: Thin  Diet effective now       Question Answer Comment  Room service appropriate? Yes   Fluid consistency: Thin      01/11/21 1113            Code Status: Full Code    Subjective 01/13/21    Patient seen this morning walking the halls with PT.  He was maintaining O2 sat on 6 L but very dyspneic and took some time to recover once seated back in his bed.  He reports he had a rough night with shortness of breath, coughing keeping him awake.  Takes melatonin at home which she was given last night but did not help.  We discussed trazodone he is agreeable to try that.  Still with some conversational dyspnea today but improved at rest compared to yesterday.   Disposition Plan & Communication   Status is: Inpatient  Remains inpatient appropriate because:Inpatient level of care appropriate due to severity of illness with severe dyspnea on exertion and continuing oxygen requirement  of 6 L/min.   Dispo: The patient is from: Home              Anticipated d/c is to: Home              Anticipated d/c date is: 3 days              Patient currently is not medically stable to d/c.   Difficult to place patient No   Family Communication: spoke with patient's fiance, Arbie Cookey, this afternoon 1/28   Consults, Procedures, Treatments   Consultants:   none  Procedures:   none  Covid-specific treatments:  Antimicrobials:  Anti-infectives (From admission, onward)   Start     Dose/Rate Route Frequency Ordered Stop   01/12/21 1000  remdesivir 100 mg in sodium chloride 0.9 % 100 mL IVPB       "Followed by" Linked Group Details   100 mg 200 mL/hr over 30 Minutes Intravenous Daily 01/11/21 0729 01/16/21 0959   01/11/21 0830  remdesivir 200 mg in sodium chloride 0.9% 250 mL IVPB       "Followed by" Linked Group Details   200 mg 580 mL/hr over 30 Minutes Intravenous Once 01/11/21 0729 01/11/21 1104  Objective   Vitals:   01/13/21 0452 01/13/21 0623 01/13/21 0742 01/13/21 0750  BP: (!) 145/61  138/65   Pulse:   80   Resp: 17  18   Temp: 97.9 F (36.6 C)  97.8 F (36.6 C)   TempSrc: Oral     SpO2: 90%  90% 91%  Weight:  78.2 kg    Height:        Intake/Output Summary (Last 24 hours) at 01/13/2021 0809 Last data filed at 01/13/2021 2633 Gross per 24 hour  Intake 100 ml  Output 600 ml  Net -500 ml   Filed Weights   01/11/21 0541 01/13/21 0623  Weight: 78.9 kg 78.2 kg    Physical Exam:  General exam: awake, alert, no acute distress Respiratory system: decreased breath sounds, shallow inspiration, expiratory wheezes, increased respiratory effort with conversational dyspnea. Cardiovascular system: RRR, no pedal edema.   Central nervous system: A&O x4. no gross focal neurologic deficits, normal speech Extremities: moves all, no cyanosis, normal tone   Labs   Data Reviewed: I have personally reviewed following labs and imaging  studies  CBC: Recent Labs  Lab 01/11/21 0538 01/12/21 0445 01/13/21 0324  WBC 8.2 7.8 13.6*  NEUTROABS 5.7 6.3 12.0*  HGB 16.5 14.6 14.8  HCT 49.6 43.3 42.1  MCV 91.7 90.2 88.3  PLT 160 164 354   Basic Metabolic Panel: Recent Labs  Lab 01/11/21 0538 01/12/21 0445 01/12/21 1940 01/13/21 0324  NA 135 133*  --  137  K 5.6* 5.9* 5.4* 5.2*  CL 104 105  --  109  CO2 20* 20*  --  21*  GLUCOSE 170* 351*  --  335*  BUN 43* 60*  --  72*  CREATININE 1.64* 1.78*  --  1.47*  CALCIUM 9.1 8.8*  --  8.8*  MG  --  2.0  --  2.2   GFR: Estimated Creatinine Clearance: 42.1 mL/min (A) (by C-G formula based on SCr of 1.47 mg/dL (H)). Liver Function Tests: Recent Labs  Lab 01/11/21 0538 01/12/21 0445 01/13/21 0324  AST $Re'29 27 31  'hYJ$ ALT 35 30 34  ALKPHOS 74 67 64  BILITOT 0.9 0.6 0.6  PROT 7.5 6.6 6.4*  ALBUMIN 4.1 3.4* 3.3*   No results for input(s): LIPASE, AMYLASE in the last 168 hours. No results for input(s): AMMONIA in the last 168 hours. Coagulation Profile: No results for input(s): INR, PROTIME in the last 168 hours. Cardiac Enzymes: No results for input(s): CKTOTAL, CKMB, CKMBINDEX, TROPONINI in the last 168 hours. BNP (last 3 results) No results for input(s): PROBNP in the last 8760 hours. HbA1C: Recent Labs    01/12/21 0445  HGBA1C 7.5*   CBG: Recent Labs  Lab 01/12/21 0739 01/12/21 1134 01/12/21 1529 01/12/21 2123 01/13/21 0742  GLUCAP 290* 198* 251* 230* 233*   Lipid Profile: Recent Labs    01/11/21 1215 01/12/21 0445  CHOL  --  90  HDL  --  28*  LDLCALC  --  45  TRIG 55 87  CHOLHDL  --  3.2   Thyroid Function Tests: No results for input(s): TSH, T4TOTAL, FREET4, T3FREE, THYROIDAB in the last 72 hours. Anemia Panel: Recent Labs    01/12/21 0445 01/13/21 0324  FERRITIN 184 156   Sepsis Labs: Recent Labs  Lab 01/11/21 1215 01/13/21 0324  PROCALCITON 0.24 0.16    Recent Results (from the past 240 hour(s))  Culture, blood (Routine X 2)  w Reflex to ID Panel  Status: None (Preliminary result)   Collection Time: 01/11/21 12:25 PM   Specimen: BLOOD  Result Value Ref Range Status   Specimen Description BLOOD RIGHT [facility]  Final   Special Requests   Final    BOTTLES DRAWN AEROBIC AND ANAEROBIC Blood Culture adequate volume   Culture   Final    NO GROWTH 2 DAYS Performed at [facility], [address] [place], [place] [address]    Report Status PENDING  Incomplete  Culture, blood (Routine X 2) w Reflex to ID Panel     Status: None (Preliminary result)   Collection Time: 01/11/21 12:26 PM   Specimen: BLOOD  Result Value Ref Range Status   Specimen Description BLOOD  LEFT [facility]  Final   Special Requests   Final    BOTTLES DRAWN AEROBIC AND ANAEROBIC Blood Culture adequate volume   Culture   Final    NO GROWTH 2 DAYS Performed at [facility], [address], [place], [place] [address]    Report Status PENDING  Incomplete  Culture, sputum-assessment     Status: None   Collection Time: 01/11/21  3:02 PM   Specimen: Expectorated Sputum  Result Value Ref Range Status   Specimen Description EXPECTORATED SPUTUM  Final   Special Requests NONE  Final   Sputum evaluation   Final    THIS SPECIMEN IS ACCEPTABLE FOR SPUTUM CULTURE Performed at [facility], [address], [place], [place] [address]    Report Status 01/11/2021 FINAL  Final  Culture, respiratory     Status: None (Preliminary result)   Collection Time: 01/11/21  3:02 PM  Result Value Ref Range Status   Specimen Description   Final    EXPECTORATED SPUTUM Performed at [facility], [address] [place], [place] [address]    Special Requests   Final    NONE Reflexed from [id number] Performed at [facility], [address] [place], [place] [address]    Gram Stain   Final    NO WBC SEEN MODERATE SQUAMOUS EPITHELIAL CELLS PRESENT ABUNDANT GRAM POSITIVE COCCI MODERATE GRAM NEGATIVE RODS RARE GRAM POSITIVE RODS     Culture   Final    MODERATE Normal respiratory flora-no Staph aureus or Pseudomonas seen Performed at [facility], [address] [place], [place] [address]    Report Status PENDING  Incomplete  C Difficile Quick Screen w PCR reflex     Status: None   Collection Time: 01/12/21  7:12 PM   Specimen: STOOL  Result Value Ref Range Status   C Diff antigen NEGATIVE NEGATIVE Final   C Diff toxin NEGATIVE NEGATIVE Final   C Diff interpretation No C. difficile detected.  Final    Comment: Performed at [facility], [address] [place], [place] [address]      Imaging Studies   No results found.   Medications   Scheduled Meds: . amLODipine  5 mg Oral Daily  . arformoterol  15 mcg Nebulization BID  . vitamin C  500 mg Oral Daily  . aspirin EC  81 mg Oral Daily  . cholecalciferol  1,000 Units Oral BH-q7a  . dextromethorphan-guaiFENesin  1 tablet Oral BID  . enoxaparin (LOVENOX) injection  40 mg Subcutaneous Q24H  . ezetimibe  10 mg Oral QHS  . fluticasone  2 puff Inhalation BID  . folic acid  500 mcg Oral QPM  . insulin aspart  0-5 Units Subcutaneous QHS  . insulin aspart  0-9 Units Subcutaneous TID WC  . insulin aspart  4 Units Subcutaneous TID WC  . insulin glargine  12 Units Subcutaneous QHS  . ipratropium  2 puff Inhalation Q4H  . melatonin  10 mg Oral QHS  . methylPREDNISolone (SOLU-MEDROL) injection  60 mg Intravenous Q12H  . metoprolol succinate  100 mg Oral Daily  . pantoprazole  40 mg Oral Daily  . polyethylene glycol  17 g Oral Daily  . zinc sulfate  220 mg Oral Daily   Continuous Infusions: . remdesivir 100 mg in NS 100 mL Stopped (01/12/21 0905)       LOS: 2 days    Time spent: 30 minutes with > 50% spent in coordination of care or direct patient contact.    Ezekiel Slocumb, DO [facility]  01/13/2021, 8:09 AM    If 7PM-7AM, please contact night-coverage. How to contact the [facility] Attending or Consulting provider [place] or  covering provider during after hours [place], for this patient?    1. Check the care team in [facility] and look for a) attending/consulting [facility] provider listed and b) the [facility] team listed 2. Log into [email or web address] and use [facility]'s universal password to access. If you do not have the password, please contact the hospital operator. 3. Locate the [facility] provider you are looking for under [facility] and page to a number that you can be directly reached. 4. If you still have difficulty reaching the provider, please page the [facility] (Director on Call) for the Hospitalists listed on amion for assistance.

## 2021-01-13 NOTE — Evaluation (Signed)
Occupational Therapy Evaluation Patient Details Name: Harold Parker MRN: [id number] DOB: 11-24-1946 Today's Date: 01/13/2021    History of Present Illness 75 y.o. 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  Patient reported he had tested positive for COVID-19 on 1/19.   Clinical Impression   Upon entering the room, pt seated on EOB and agreeable to OT evaluation. Pt lives with significant other and reports being independent in self care and functional mobility. Pt reports being on 2 L O2 at baseline. Pt on 6 L this session and O2 saturation ranges from 88-93% with functional tasks in standing. Pt demonstrates functional mobility,self care, and makes bed with supervision overall. No skilled need for occupational therapy services. OT to SIGN OFF.     Follow Up Recommendations  No OT follow up    Equipment Recommendations  None recommended by OT       Precautions / Restrictions Precautions Precautions: Fall      Mobility Bed Mobility Overal bed mobility: Modified Independent       Transfers Overall transfer level: Needs assistance   Transfers: Sit to/from Stand Sit to Stand: Supervision         General transfer comment: no physical assist    Balance Overall balance assessment: Mild deficits observed, not formally tested                                         ADL either performed or assessed with clinical judgement   ADL Overall ADL's : Needs assistance/impaired     Grooming: Wash/dry hands;Wash/dry face;Oral care;Standing;Supervision/safety                   Toilet Transfer: Supervision/safety;Regular Dietitian and Hygiene: Supervision/safety;Sit to/from stand   Tub/ Shower Transfer: Supervision/safety;Rolling walker;Ambulation   Functional mobility during ADLs: Supervision/safety General ADL Comments: Pt assisting with making up bed with supervision overall.     Vision Patient Visual Report: No change from baseline              Pertinent Vitals/Pain Pain Assessment: No/denies pain     Hand Dominance Right   Extremity/Trunk Assessment Upper Extremity Assessment Upper Extremity Assessment: Overall WFL for tasks assessed   Lower Extremity Assessment Lower Extremity Assessment: Overall WFL for tasks assessed   Cervical / Trunk Assessment Cervical / Trunk Assessment: Normal   Communication Communication Communication: No difficulties   Cognition Arousal/Alertness: Awake/alert Behavior During Therapy: WFL for tasks assessed/performed Overall Cognitive Status: Within Functional Limits for tasks assessed                                                Home Living Family/patient expects to be discharged to:: Private residence Living Arrangements: Spouse/significant other Available Help at Discharge: Family;Available 24 hours/day Type of Home: House Home Access: Stairs to enter [employer] of Steps: 5 Entrance Stairs-Rails: Right;Left Home Layout: One level     Bathroom Shower/Tub: Walk-in shower  Home Equipment: Shower seat   Additional Comments: baseline O2 of 2Ls      Prior Functioning/Environment Level of Independence: Independent                                          AM-PAC OT "6 Clicks" Daily Activity     Outcome Measure Help from another person eating meals?: None Help from another person taking care of personal grooming?: None Help from another person toileting, which includes using toliet, bedpan, or urinal?: None Help from another person bathing (including washing, rinsing,  drying)?: None Help from another person to put on and taking off regular upper body clothing?: None Help from another person to put on and taking off regular lower body clothing?: None 6 Click Score: 24   End of Session Equipment Utilized During Treatment: Oxygen Nurse Communication: Mobility status  Activity Tolerance: Patient tolerated treatment well Patient left: in bed;with call bell/phone within reach                   Time: 1200-1221 OT Time Calculation (min): 21 min Charges:  OT General Charges $OT Visit: 1 Visit OT Evaluation $OT Eval Low Complexity: 1 Low OT Treatments $Self Care/Home Management : 8-22 mins  Jackquline Denmark, MS, OTR/L , CBIS ascom [phone]  01/13/21, 4:54 PM

## 2021-01-13 NOTE — Evaluation (Signed)
Physical Therapy Evaluation Patient Details Name: Harold Parker MRN: [id number] DOB: 1946-07-14 Today's Date: 01/13/2021   History of Present Illness  Presented to ER secondary to SOB, fever; admitted for management of sepsis, acute respiratory failure related to COVID-19 (diagnosed 01/04/21), AECOPD.  Clinical Impression  Patient siting edge of bed upon arrival to room; requesting to change to clean gown.  Assisted with needs; min assist for management of lines with clothing change.  Patient alert and oriented; follows commands and demonstrates good effort with mobility tasks.  Denies pain and does endorse improvement in respiratory status since admission.  Bilat UE/LE strength and ROM grossly symmetrical and WFL; no focal weakness appreciated.  Able to complete sit/stand, basic transfers and gait (110') without assist device (pushing IV pole), cga/close sup.  Demonstrates reciprocal stepping pattern with fair step height/length; slow, guarded cadence but no overt buckling or LOB.  Standing rest break x2 to complete distance; sats 90% on 6L supplemental O2.  BORG 5/10 after distance, requiring 2-3 min seated rest break for recovery. Would benefit from skilled PT to address above deficits and promote optimal return to PLOF.; will maintain on caseload to ensure progressive mobility throughout remaining hospital stay.  Anticipate no formal PT needs upon discharge; would benefit from return to pulmonary rehab as medically appropriate.    Follow Up Recommendations No PT follow up (resumption of pulmonary rehab as appropriate)    Equipment Recommendations       Recommendations for Other Services       Precautions / Restrictions Precautions Precautions: Fall Restrictions Weight Bearing Restrictions: No      Mobility  Bed Mobility Overal bed mobility: Independent                  Transfers Overall transfer level: Needs assistance Equipment used: None Transfers: Sit to/from  Stand Sit to Stand: Supervision         General transfer comment: no physical assist  Ambulation/Gait Ambulation/Gait assistance: Supervision;Min guard Gait Distance (Feet): 110 Feet Assistive device: IV Pole       General Gait Details: reciprocal stepping pattern with fair step height/length; slow, guarded cadence but no overt buckling or LOB.  Standing rest break x2 to complete distance; sats 90% on 6L supplemental O2.  BORG 5/10 after distance, requiring 2-3 min seated rest break for recovery  Stairs            Wheelchair Mobility    Modified Rankin (Stroke Patients Only)       Balance Overall balance assessment: Needs assistance Sitting-balance support: No upper extremity supported;Feet supported Sitting balance-Leahy Scale: Good     Standing balance support: No upper extremity supported Standing balance-Leahy Scale: Good                               Pertinent Vitals/Pain Pain Assessment: No/denies pain    Home Living Family/patient expects to be discharged to:: Private residence Living Arrangements: Spouse/significant other Available Help at Discharge: Family;Available 24 hours/day Type of Home: House Home Access: Stairs to enter Entrance Stairs-Rails: Doctor, general practice of Steps: 5 Home Layout: One level Home Equipment: Shower seat Additional Comments: baseline O2 of 2Ls    Prior Function Level of Independence: Independent         Comments: Indep with ADLs, household and community mobilization without assist device; denies fall history; home O2 at 2L.  Active participant with pulmonary rehab program.     Hand Dominance  Dominant Hand: Right    Extremity/Trunk Assessment   Upper Extremity Assessment Upper Extremity Assessment: Overall WFL for tasks assessed    Lower Extremity Assessment Lower Extremity Assessment: Overall WFL for tasks assessed    Cervical / Trunk Assessment Cervical / Trunk Assessment:  Normal  Communication   Communication: No difficulties  Cognition Arousal/Alertness: Awake/alert Behavior During Therapy: WFL for tasks assessed/performed Overall Cognitive Status: Within Functional Limits for tasks assessed                                        General Comments      Exercises     Assessment/Plan    PT Assessment Patient needs continued PT services  PT Problem List Decreased mobility;Decreased activity tolerance;Cardiopulmonary status limiting activity       PT Treatment Interventions DME instruction;Gait training;Stair training;Functional mobility training;Therapeutic activities;Patient/family education;Therapeutic exercise;Balance training    PT Goals (Current goals can be found in the Care Plan section)  Acute Rehab PT Goals Patient Stated Goal: to return home PT Goal Formulation: With patient Time For Goal Achievement: 01/27/21 Potential to Achieve Goals: Good    Frequency Min 2X/week   Barriers to discharge        Co-evaluation               AM-PAC PT "6 Clicks" Mobility  Outcome Measure Help needed turning from your back to your side while in a flat bed without using bedrails?: None Help needed moving from lying on your back to sitting on the side of a flat bed without using bedrails?: None Help needed moving to and from a bed to a chair (including a wheelchair)?: None Help needed standing up from a chair using your arms (e.g., wheelchair or bedside chair)?: None Help needed to walk in hospital room?: None Help needed climbing 3-5 steps with a railing? : A Little 6 Click Score: 23    End of Session Equipment Utilized During Treatment: Oxygen Activity Tolerance: Patient tolerated treatment well Patient left:  (seated edge of bed, neeed in reach; alarm not required)   PT Visit Diagnosis: Muscle weakness (generalized) (M62.81);Difficulty in walking, not elsewhere classified (R26.2)    Time: 1093-2355 PT Time  Calculation (min) (ACUTE ONLY): 17 min   Charges:   PT Evaluation $PT Eval Moderate Complexity: 1 Mod          Rambo Sarafian H. Manson Passey, PT, DPT, NCS 01/13/21, 8:17 PM [phone]

## 2021-01-13 NOTE — Care Management Important Message (Deleted)
Important Message  Patient Details  Name: Griffith Santilli MRN: [id number] Date of Birth: 1946-11-29   Medicare Important Message Given:        Allayne Butcher, RN 01/13/2021, 12:39 PM

## 2021-01-13 NOTE — TOC Initial Note (Signed)
Transition of Care [facility]) - Initial/Assessment Note    Patient Details  Name: Harold Parker MRN: [id number] Date of Birth: 1946-01-04  [facility] CM/SW Contact:    Harold Butcher, RN Phone Number: 01/13/2021, 3:43 PM  Clinical Narrative:                 Patient admitted with COVID currently requiring oxygen at 6L [employer].  Patient is on chronic home O2 at 2L.  Patient cannot remember the name of his oxygen company.   Patient is from home with his significant other Harold Parker.  Patient is completely independent in ADL's.  No discharge needs identified at this time.  TOC will cont to follow.   Expected Discharge Plan: Home/Self Care Barriers to Discharge: Continued Medical Work up   Patient Goals and CMS Choice Patient states their goals for this hospitalization and ongoing recovery are:: just wants to get better and get back home      Expected Discharge Plan and Services Expected Discharge Plan: Home/Self Care   Discharge Planning Services: CM Consult   Living arrangements for the past 2 months: Single Family Home                           HH Arranged: NA          Prior Living Arrangements/Services Living arrangements for the past 2 months: Single Family Home Lives with:: Significant Other Patient language and need for interpreter reviewed:: Yes Do you feel safe going back to the place where you live?: Yes      Need for Family Participation in Patient Care: Yes (Comment) (COVID) Care giver support system in place?: Yes (comment) (significant other) Current home services: DME (oxygen) Criminal Activity/Legal Involvement Pertinent to Current Situation/Hospitalization: No - Comment as needed  Activities of Daily Living Home Assistive Devices/Equipment: None ADL Screening (condition at time of admission) Patient's cognitive ability adequate to safely complete daily activities?: Yes Is the patient deaf or have difficulty hearing?: No Does the patient have  difficulty seeing, even when wearing glasses/contacts?: No Does the patient have difficulty concentrating, remembering, or making decisions?: No Patient able to express need for assistance with ADLs?: Yes Does the patient have difficulty dressing or bathing?: No Independently performs ADLs?: Yes (appropriate for developmental age) Does the patient have difficulty walking or climbing stairs?: No Weakness of Legs: Both (intermittently) Weakness of Arms/Hands: None  Permission Sought/Granted Permission sought to share information with : Case Manager,Family Supports Permission granted to share information with : Yes, Verbal Permission Granted  Share Information with NAME: Harold Parker     Permission granted to share info w Relationship: significant other     Emotional Assessment Appearance:: Appears younger than stated age Attitude/Demeanor/Rapport: Engaged Affect (typically observed): Accepting Orientation: : Oriented to Self,Oriented to Place,Oriented to  Time,Oriented to Situation Alcohol / Substance Use: Not Applicable Psych Involvement: No (comment)  Admission diagnosis:  SOB (shortness of breath) [R06.02] Hypoxia [R09.02] AKI (acute kidney injury) (HCC) [N17.9] Acute hypoxemic respiratory failure due to COVID-19 (HCC) [U07.1, J96.01] Acute respiratory disease due to COVID-19 virus [U07.1, J06.9] Patient Active Problem List   Diagnosis Date Noted  . Acute respiratory disease due to COVID-19 virus 01/11/2021  . HTN (hypertension)   . Chronic systolic CHF (congestive heart failure) (HCC)   . COPD (chronic obstructive pulmonary disease) (HCC)   . GERD (gastroesophageal reflux disease)   . Anxiety   . Acute renal failure superimposed on stage 2  chronic kidney disease (HCC)   . Hyperkalemia   . Elevated troponin   . Acute hypoxemic respiratory failure due to COVID-19 (HCC)   . Melena   . Duodenal ulceration   . Gastrointestinal hemorrhage   . Severe anemia 03/14/2018  . 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 S/P aortic valve replacement with bioprosthetic valve  02/19/2018  . Preoperative respiratory examination 02/13/2018  . Acute respiratory failure with hypoxia (HCC) 02/11/2018  . Influenza with respiratory manifestation 02/11/2018  . Stage 3 severe COPD by GOLD classification (HCC)   . Aortic stenosis   . Bronchitis, chronic obstructive, with exacerbation (HCC)   . Tobacco abuse   . CAD (coronary artery disease) 02/07/2018  . Acute systolic CHF (congestive heart failure) (HCC) 02/07/2018  . Ischemic cardiomyopathy 02/07/2018  . Type II diabetes mellitus with renal manifestations (HCC) 02/07/2018  . Hyperlipidemia LDL goal <70 02/07/2018  . COPD exacerbation (HCC) 02/05/2018  . NSTEMI (non-ST elevated myocardial infarction) (HCC) 02/05/2018   PCP:  Harold Laughter, MD Pharmacy:   [id number] - [place], [place] - [address] AT [facility] [address] [facility] [address] [place] [address] Phone: [phone] Fax: [phone]  [facility] [phone] - [place], [place] - [address] [place] [address] Phone: [phone] Fax: [phone]     Social Determinants of Health (SDOH) Interventions    Readmission Risk Interventions Readmission Risk Prevention Plan 01/13/2021  Transportation Screening Complete  PCP or Specialist Appt within 3-5 Days Complete  HRI or Home Care Consult Complete  Social Work Consult for Recovery Care Planning/Counseling Complete  Palliative Care Screening Not Applicable  Medication Review Oceanographer) Complete  Some recent data might be hidden

## 2021-01-14 DIAGNOSIS — I5022 Chronic systolic (congestive) heart failure: Secondary | ICD-10-CM | POA: Diagnosis not present

## 2021-01-14 DIAGNOSIS — F419 Anxiety disorder, unspecified: Secondary | ICD-10-CM | POA: Diagnosis not present

## 2021-01-14 DIAGNOSIS — J449 Chronic obstructive pulmonary disease, unspecified: Secondary | ICD-10-CM | POA: Diagnosis not present

## 2021-01-14 DIAGNOSIS — U071 COVID-19: Secondary | ICD-10-CM | POA: Diagnosis not present

## 2021-01-14 LAB — CBC WITH DIFFERENTIAL/PLATELET
Abs Immature Granulocytes: 0.1 10*3/uL — ABNORMAL HIGH (ref 0.00–0.07)
Basophils Absolute: 0 10*3/uL (ref 0.0–0.1)
Basophils Relative: 0 %
Eosinophils Absolute: 0 10*3/uL (ref 0.0–0.5)
Eosinophils Relative: 0 %
HCT: 48.7 % (ref 39.0–52.0)
Hemoglobin: 16.5 g/dL (ref 13.0–17.0)
Immature Granulocytes: 1 %
Lymphocytes Relative: 5 %
Lymphs Abs: 0.8 10*3/uL (ref 0.7–4.0)
MCH: 30.4 pg (ref 26.0–34.0)
MCHC: 33.9 g/dL (ref 30.0–36.0)
MCV: 89.7 fL (ref 80.0–100.0)
Monocytes Absolute: 1 10*3/uL (ref 0.1–1.0)
Monocytes Relative: 6 %
Neutro Abs: 14.3 10*3/uL — ABNORMAL HIGH (ref 1.7–7.7)
Neutrophils Relative %: 88 %
Platelets: 241 10*3/uL (ref 150–400)
RBC: 5.43 MIL/uL (ref 4.22–5.81)
RDW: 14.1 % (ref 11.5–15.5)
WBC: 16.1 10*3/uL — ABNORMAL HIGH (ref 4.0–10.5)
nRBC: 0 % (ref 0.0–0.2)

## 2021-01-14 LAB — CULTURE, RESPIRATORY W GRAM STAIN
Culture: NORMAL
Gram Stain: NONE SEEN

## 2021-01-14 LAB — COMPREHENSIVE METABOLIC PANEL
ALT: 49 U/L — ABNORMAL HIGH (ref 0–44)
AST: 34 U/L (ref 15–41)
Albumin: 3.8 g/dL (ref 3.5–5.0)
Alkaline Phosphatase: 70 U/L (ref 38–126)
Anion gap: 7 (ref 5–15)
BUN: 49 mg/dL — ABNORMAL HIGH (ref 8–23)
CO2: 26 mmol/L (ref 22–32)
Calcium: 9.6 mg/dL (ref 8.9–10.3)
Chloride: 110 mmol/L (ref 98–111)
Creatinine, Ser: 0.89 mg/dL (ref 0.61–1.24)
GFR, Estimated: >60 mL/min (ref >60)
Glucose, Bld: 254 mg/dL — ABNORMAL HIGH (ref 70–99)
Potassium: 5.1 mmol/L (ref 3.5–5.1)
Sodium: 143 mmol/L (ref 135–145)
Total Bilirubin: 0.9 mg/dL (ref 0.3–1.2)
Total Protein: 7.5 g/dL (ref 6.5–8.1)

## 2021-01-14 LAB — GLUCOSE, CAPILLARY
Glucose-Capillary: 252 mg/dL — ABNORMAL HIGH (ref 70–99)
Glucose-Capillary: 343 mg/dL — ABNORMAL HIGH (ref 70–99)
Glucose-Capillary: 295 mg/dL — ABNORMAL HIGH (ref 70–99)
Glucose-Capillary: 138 mg/dL — ABNORMAL HIGH (ref 70–99)

## 2021-01-14 LAB — MAGNESIUM: Magnesium: 2 mg/dL (ref 1.7–2.4)

## 2021-01-14 LAB — C-REACTIVE PROTEIN: CRP: 2.2 mg/dL — ABNORMAL HIGH (ref <1.0)

## 2021-01-14 LAB — FERRITIN: Ferritin: 201 ng/mL (ref 24–336)

## 2021-01-14 LAB — PROCALCITONIN: Procalcitonin: 0.1 ng/mL

## 2021-01-14 LAB — FIBRIN DERIVATIVES D-DIMER (ARMC ONLY): Fibrin derivatives D-dimer (ARMC): 486.62 ng/mL (FEU) (ref 0.00–499.00)

## 2021-01-14 MED ORDER — ALPRAZOLAM 0.5 MG PO TABS
0.5000 mg | ORAL_TABLET | Freq: Two times a day (BID) | ORAL | Status: DC
  Administered 2021-01-14 – 2021-01-24 (×19): 0.5 mg via ORAL
  Filled 2021-01-14 (×20): qty 1

## 2021-01-14 MED ORDER — INSULIN GLARGINE 100 UNIT/ML ~~LOC~~ SOLN
18.0000 [IU] | Freq: Every day | SUBCUTANEOUS | Status: DC
  Administered 2021-01-14: 21:00:00 5 [IU] via SUBCUTANEOUS
  Filled 2021-01-14 (×2): qty 0.18

## 2021-01-14 MED ORDER — METHYLPREDNISOLONE SODIUM SUCC 125 MG IJ SOLR
60.0000 mg | Freq: Three times a day (TID) | INTRAMUSCULAR | Status: DC
  Administered 2021-01-14 – 2021-01-18 (×12): 60 mg via INTRAVENOUS
  Filled 2021-01-14 (×12): qty 2

## 2021-01-14 MED ORDER — INSULIN ASPART 100 UNIT/ML ~~LOC~~ SOLN
8.0000 [IU] | Freq: Three times a day (TID) | SUBCUTANEOUS | Status: DC
  Administered 2021-01-14 – 2021-01-15 (×4): 8 [IU] via SUBCUTANEOUS
  Filled 2021-01-14 (×3): qty 1

## 2021-01-14 MED ORDER — FUROSEMIDE 40 MG PO TABS
40.0000 mg | ORAL_TABLET | Freq: Every day | ORAL | Status: DC
  Administered 2021-01-14 – 2021-01-24 (×11): 40 mg via ORAL
  Filled 2021-01-14 (×11): qty 1

## 2021-01-14 MED ORDER — ALPRAZOLAM 0.5 MG PO TABS
0.5000 mg | ORAL_TABLET | Freq: Every day | ORAL | Status: DC | PRN
  Administered 2021-01-14 – 2021-01-22 (×8): 0.5 mg via ORAL
  Filled 2021-01-14 (×7): qty 1

## 2021-01-14 MED ORDER — LOSARTAN POTASSIUM 50 MG PO TABS
50.0000 mg | ORAL_TABLET | Freq: Every day | ORAL | Status: DC
  Administered 2021-01-15 – 2021-01-24 (×10): 50 mg via ORAL
  Filled 2021-01-14 (×10): qty 1

## 2021-01-14 NOTE — Progress Notes (Addendum)
PROGRESS NOTE    Harold Parker   ZYS:[id number]  DOB: 28-Oct-1946  PCP: Lajean Manes, MD    DOA: 01/11/2021 LOS: 3   Brief Narrative / Hospital Course to-date   75 y.o. 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  Patient reported he had tested positive for COVID-19 on 1/19.  On arrival patient hypoxic with O2 sat 87% on room air which improved on 3 L/min nasal cannula oxygen.  He tested positive for COVID-19.  Labs were notable for worsening renal function, potassium 5.6.  He met sepsis criteria with he met sepsis criteria with fever 100.8 F, RR 31.   Chest x-ray was negative for infiltrates.  Admitted to hospitalist service for management of acute on chronic respiratory failure with hypoxia due to Covid-19 infection.    Significant Events: -01/11/21 admitted  -1/27 oxygen need 4 >> 6 L/min, severe dyspnea even at rest & with conversation  Date of +Covid Test: 01/04/21  Vaccination status: 2 doses Pfizer 02/06/20, 02/29/20  Assessment & Plan   Principal Problem:   Acute hypoxemic respiratory failure due to COVID-19 [facility]) Active Problems:   CAD (coronary artery disease)   Type II diabetes mellitus with renal manifestations (HCC)   Hyperlipidemia LDL goal <70   HTN (hypertension)   Chronic systolic CHF (congestive heart failure) (HCC)   COPD (chronic obstructive pulmonary disease) (HCC)   GERD (gastroesophageal reflux disease)   Anxiety   Acute renal failure superimposed on stage 2 chronic kidney disease (HCC)   Hyperkalemia   Elevated troponin   Acute on chronic hypoxic respiratory failure due to COVID-19 infection with secondary acute exacerbation of  COPD Patient reported testing positive for COVID-19 on 1/19, confirmed here on admission. Baseline oxygen requirement 2 L/min, reacquiring up to 6 L/min by HFNC when seen this morning and patient significantly dyspneic with any exertion or conversation. Appears Covid infection has exacerbated his COPD. 1/29 - still severe DOE, 6 L/min O2 --Continue remdesivir --Increase frequency Solu-Medrol 60 mg IV BID>> TID --Scheduled Mucinex, Tussionex as needed --Continue inhalers per orders --IS and flutter valve --O2 to maintain sats 88-93%, wean as tolerated --follow inflammatory markers, CMP, CBC daily --prone or side laying as much as possible --monitor volume status, goal net negative --> resume Lasix tomorrow if renal function better --will consult pulm if worsening course  Hyperkalemia - POA with K 5.6.  Treated with Lokelma.  Resolved.   --Daily BMP's to monitor --Repeat Lokelma if needed  Odynophagia - trial of Carafate, on PPI increase to BID for now.  Suspect underlying esophagitis due to GERD, aggravated by high dose steroids.    AKI superimposed on CKD stage II - Resolved.  Presented with creatinine 1.64, prior creatinine ranging from 0.8-1.1 on chart review. Cr trend: 1.64>> 1.78>> 1.47>>0.89 --Resume Lasix, losartan --Monitor BMP daily  Steroid-induced hyperglycemia type 2 diabetes with renal manifestations -A1c 7.0 recently, uncontrolled.  Takes Metformin glipizide at home. CBGs elevated due to IV steroids for Covid and COPD. --Increase Lantus to 18 units daily --increase NovoLog to 8 units 3 times daily with meals --continue sliding scale NovoLog  Headache -likely due to Covid infection. --Fioricet or Tylenol as needed  Troponin elevation -due  to demand ischemia in the setting of worsening hypoxia.  No acute ischemic EKG changes or active chest pain.  Monitor.  Chronic systolic CHF -echo on 40/08/8118 showed EF 45 to 50%.  No pulmonary edema seen on chest x-ray,  BNP 181 on admission.  Patient well compensated at this time. --Monitor volume status closely --Resume Lasix & losartan, AKI resolved --continue metoprolol --I/O's and daily weights  CAD status post CABG -stable.   --Continue aspirin, Zetia, metoprolol.  Hyperlipidemia -continue Zetia.  LDL goal less than 70. Lipid panel this admission: Total cholesterol 70, low LDH 28, LDL 45 at goal, triglycerides 87.  Hypertension -continue home amlodipine.  Resume Lasix and Cozaar, AKI resolved.  IV hydralazine as needed.  COPD -currently with acute exacerbation as above.  Management as above.  GERD -continue Protonix  Anxiety with Panic Attacks -continue home Xanax.  Consider starting SSRI, PRN Vistaril for acute panic.  Will discuss with patient.   Patient BMI: Body mass index is 28.93 kg/m.   DVT prophylaxis: enoxaparin (LOVENOX) injection 40 mg Start: 01/11/21 2200   Diet:  Diet Orders (From admission, onward)    Start     Ordered   01/11/21 1114  Diet Heart Room service appropriate? Yes; Fluid consistency: Thin  Diet effective now       Question Answer Comment  Room service appropriate? Yes   Fluid consistency: Thin      01/11/21 1113            Code Status: Full Code    Subjective 01/14/21    Patient seen up in chair today.  He reports still having significant dyspnea with any exertion or talking.  Persistent cough mostly nonproductive.  Carafate helped somewhat.     Disposition Plan & Communication   Status is: Inpatient  Remains inpatient appropriate because:Inpatient level of care appropriate due to severity of illness with severe dyspnea on exertion and continuing oxygen requirement of 6 L/min and remains on IV therapies as above.   Dispo: The patient is from: Home              Anticipated d/c is to: Home              Anticipated d/c date is: 3+ days              Patient currently is not medically stable to d/c.   Difficult to place patient  No   Family Communication: spoke with patient's fiance, Arbie Cookey afternoon 1/28   Consults, Procedures, Treatments   Consultants:   none  Procedures:   none  Covid-specific treatments:  Antimicrobials:  Anti-infectives (From admission, onward)   Start     Dose/Rate Route Frequency Ordered Stop   01/12/21 1000  remdesivir 100 mg in sodium chloride 0.9 % 100 mL IVPB       "Followed by" Linked Group Details   100 mg 200 mL/hr over 30 Minutes Intravenous Daily 01/11/21 0729 01/16/21 0959   01/11/21 0830  remdesivir 200 mg in sodium chloride 0.9% 250 mL IVPB       "Followed by" Linked Group Details   200 mg 580 mL/hr over 30 Minutes Intravenous Once 01/11/21 0729 01/11/21 1104        Objective   Vitals:   01/14/21 0744 01/14/21 0807 01/14/21 1152 01/14/21 1554  BP:  (!) 118/101 136/67 (!) 154/70  Pulse:  86 79 80  Resp:   18 18  Temp:  98.8 F (37.1 C) 98.5 F (36.9 C)  98 F (36.7 C)  TempSrc:      SpO2: 94% 92% 98% 96%  Weight:      Height:        Intake/Output Summary (Last 24 hours) at 01/14/2021 1629 Last data filed at 01/14/2021 0645 Gross per 24 hour  Intake --  Output 875 ml  Net -875 ml   Filed Weights   01/11/21 0541 01/13/21 0623 01/14/21 0500  Weight: 78.9 kg 78.2 kg 81.3 kg    Physical Exam:  General exam: awake, alert, no acute distress Respiratory system: poor aeration, expiratory wheezes, inspirations trigger coughing, increased respiratory effort with conversational dyspnea, on 6 L/min oxygen. Cardiovascular system: RRR, no pedal edema.   Central nervous system: A&O x4. no gross focal neurologic deficits, normal speech Extremities: moves all, no cyanosis, normal tone   Labs   Data Reviewed: I have personally reviewed following labs and imaging studies  CBC: Recent Labs  Lab 01/11/21 0538 01/12/21 0445 01/13/21 0324 01/14/21 0513  WBC 8.2 7.8 13.6* 16.1*  NEUTROABS 5.7 6.3 12.0* 14.3*  HGB 16.5 14.6 14.8 16.5  HCT 49.6 43.3 42.1  48.7  MCV 91.7 90.2 88.3 89.7  PLT 160 164 180 032   Basic Metabolic Panel: Recent Labs  Lab 01/11/21 0538 01/12/21 0445 01/12/21 1940 01/13/21 0324 01/14/21 0513  NA 135 133*  --  137 143  K 5.6* 5.9* 5.4* 5.2* 5.1  CL 104 105  --  109 110  CO2 20* 20*  --  21* 26  GLUCOSE 170* 351*  --  335* 254*  BUN 43* 60*  --  72* 49*  CREATININE 1.64* 1.78*  --  1.47* 0.89  CALCIUM 9.1 8.8*  --  8.8* 9.6  MG  --  2.0  --  2.2 2.0   GFR: Estimated Creatinine Clearance: 70.7 mL/min (by C-G formula based on SCr of 0.89 mg/dL). Liver Function Tests: Recent Labs  Lab 01/11/21 0538 01/12/21 0445 01/13/21 0324 01/14/21 0513  AST _0 34  ALT 35 30 34 49*  ALKPHOS 74 67 64 70  BILITOT 0.9 0.6 0.6 0.9  PROT 7.5 6.6 6.4* 7.5  ALBUMIN 4.1 3.4* 3.3* 3.8   No results for input(s): LIPASE, AMYLASE in the last 168 hours. No results for input(s): AMMONIA in the last 168 hours. Coagulation Profile: No results for input(s): INR, PROTIME in the last 168 hours. Cardiac Enzymes: No results for input(s): CKTOTAL, CKMB, CKMBINDEX, TROPONINI in the last 168 hours. BNP (last 3 results) No results for input(s): PROBNP in the last 8760 hours. HbA1C: Recent Labs    01/12/21 0445  HGBA1C 7.5*   CBG: Recent Labs  Lab 01/13/21 1623 01/13/21 2039 01/14/21 0806 01/14/21 1153 01/14/21 1557  GLUCAP 249* 288* 252* 343* 295*   Lipid Profile: Recent Labs    01/12/21 0445  CHOL 90  HDL 28*  LDLCALC 45  TRIG 87  CHOLHDL 3.2   Thyroid Function Tests: No results for input(s): TSH, T4TOTAL, FREET4, T3FREE, THYROIDAB in the last 72 hours. Anemia Panel: Recent Labs    01/13/21 0324 01/14/21 0513  FERRITIN 156 201   Sepsis Labs: Recent Labs  Lab 01/11/21 1215 01/13/21 0324 01/14/21 0513  PROCALCITON 0.24 0.16 0.10    Recent Results (from the past 240 hour(s))  Culture, blood (Routine X 2) w Reflex to ID Panel     Status: None (Preliminary result)   Collection Time: 01/11/21  12:25 PM   Specimen: BLOOD  Result Value Ref Range Status  Specimen Description BLOOD RIGHT [facility]  Final   Special Requests   Final    BOTTLES DRAWN AEROBIC AND ANAEROBIC Blood Culture adequate volume   Culture   Final    NO GROWTH 3 DAYS Performed at [facility], [address] [place], [place] [address]    Report Status PENDING  Incomplete  Culture, blood (Routine X 2) w Reflex to ID Panel     Status: None (Preliminary result)   Collection Time: 01/11/21 12:26 PM   Specimen: BLOOD  Result Value Ref Range Status   Specimen Description BLOOD  LEFT [facility]  Final   Special Requests   Final    BOTTLES DRAWN AEROBIC AND ANAEROBIC Blood Culture adequate volume   Culture   Final    NO GROWTH 3 DAYS Performed at [facility], [address] [place], [place] [address]    Report Status PENDING  Incomplete  Culture, sputum-assessment     Status: None   Collection Time: 01/11/21  3:02 PM   Specimen: Expectorated Sputum  Result Value Ref Range Status   Specimen Description EXPECTORATED SPUTUM  Final   Special Requests NONE  Final   Sputum evaluation   Final    THIS SPECIMEN IS ACCEPTABLE FOR SPUTUM CULTURE Performed at [facility], [address], [place], [place] [address]    Report Status 01/11/2021 FINAL  Final  Culture, respiratory     Status: None   Collection Time: 01/11/21  3:02 PM  Result Value Ref Range Status   Specimen Description   Final    EXPECTORATED SPUTUM Performed at [facility], [address], [place], [place] [address]    Special Requests   Final    NONE Reflexed from [id number] Performed at [facility], [address] [place], [place] [address]    Gram Stain   Final    NO WBC SEEN MODERATE SQUAMOUS EPITHELIAL CELLS PRESENT ABUNDANT GRAM POSITIVE COCCI MODERATE GRAM NEGATIVE RODS RARE GRAM POSITIVE RODS    Culture   Final    MODERATE Normal respiratory flora-no Staph aureus or Pseudomonas seen Performed at  [facility], [place] [address] [place], [place] [address]    Report Status 01/14/2021 FINAL  Final  C Difficile Quick Screen w PCR reflex     Status: None   Collection Time: 01/12/21  7:12 PM   Specimen: STOOL  Result Value Ref Range Status   C Diff antigen NEGATIVE NEGATIVE Final   C Diff toxin NEGATIVE NEGATIVE Final   C Diff interpretation No C. difficile detected.  Final    Comment: Performed at [facility], [address], [place], [place] [address]      Imaging Studies   No results found.   Medications   Scheduled Meds: . amLODipine  5 mg Oral Daily  . arformoterol  15 mcg Nebulization BID  . vitamin C  500 mg Oral Daily  . aspirin EC  81 mg Oral Daily  . cholecalciferol  1,000 Units Oral BH-q7a  . dextromethorphan-guaiFENesin  1 tablet Oral BID  . enoxaparin (LOVENOX) injection  40 mg Subcutaneous Q24H  . ezetimibe  10 mg Oral QHS  . fluticasone  2 puff Inhalation BID  . folic acid  742 mcg Oral QPM  . insulin aspart  0-5 Units Subcutaneous QHS  . insulin aspart  0-9 Units Subcutaneous TID WC  . insulin aspart  8 Units Subcutaneous TID WC  . insulin glargine  12 Units Subcutaneous  QHS  . ipratropium  2 puff Inhalation Q4H  . methylPREDNISolone (SOLU-MEDROL) injection  60 mg Intravenous Q8H  . metoprolol succinate  100 mg Oral Daily  . pantoprazole  40 mg Oral BID  . polyethylene glycol  17 g Oral Daily  . sucralfate  1 g Oral TID WC & HS  . traZODone  50 mg Oral QHS  . zinc sulfate  220 mg Oral Daily   Continuous Infusions: . remdesivir 100 mg in NS 100 mL 100 mg (01/14/21 0956)       LOS: 3 days    Time spent: 25 minutes with > 50% spent in coordination of care or direct patient contact.    Ezekiel Slocumb, DO [facility]  01/14/2021, 4:29 PM    If 7PM-7AM, please contact night-coverage. How to contact the [facility] Attending or Consulting provider [place] or covering provider during after hours [place], for this patient?     1. Check the care team in [facility] and look for a) attending/consulting [facility] provider listed and b) the [facility] team listed 2. Log into [email or web address] and use [facility]'s universal password to access. If you do not have the password, please contact the hospital operator. 3. Locate the [facility] provider you are looking for under [facility] and page to a number that you can be directly reached. 4. If you still have difficulty reaching the provider, please page the [facility] (Director on Call) for the Hospitalists listed on amion for assistance.

## 2021-01-15 ENCOUNTER — Other Ambulatory Visit: Payer: Self-pay

## 2021-01-15 ENCOUNTER — Encounter: Payer: Self-pay | Admitting: Internal Medicine

## 2021-01-15 DIAGNOSIS — F419 Anxiety disorder, unspecified: Secondary | ICD-10-CM | POA: Diagnosis not present

## 2021-01-15 DIAGNOSIS — U071 COVID-19: Secondary | ICD-10-CM | POA: Diagnosis not present

## 2021-01-15 DIAGNOSIS — J449 Chronic obstructive pulmonary disease, unspecified: Secondary | ICD-10-CM | POA: Diagnosis not present

## 2021-01-15 DIAGNOSIS — I5022 Chronic systolic (congestive) heart failure: Secondary | ICD-10-CM | POA: Diagnosis not present

## 2021-01-15 LAB — COMPREHENSIVE METABOLIC PANEL
ALT: 110 U/L — ABNORMAL HIGH (ref 0–44)
AST: 50 U/L — ABNORMAL HIGH (ref 15–41)
Albumin: 3.3 g/dL — ABNORMAL LOW (ref 3.5–5.0)
Alkaline Phosphatase: 71 U/L (ref 38–126)
Anion gap: 8 (ref 5–15)
BUN: 42 mg/dL — ABNORMAL HIGH (ref 8–23)
CO2: 26 mmol/L (ref 22–32)
Calcium: 9.1 mg/dL (ref 8.9–10.3)
Chloride: 106 mmol/L (ref 98–111)
Creatinine, Ser: 1.02 mg/dL (ref 0.61–1.24)
GFR, Estimated: >60 mL/min (ref >60)
Glucose, Bld: 244 mg/dL — ABNORMAL HIGH (ref 70–99)
Potassium: 4.4 mmol/L (ref 3.5–5.1)
Sodium: 140 mmol/L (ref 135–145)
Total Bilirubin: 0.9 mg/dL (ref 0.3–1.2)
Total Protein: 6.9 g/dL (ref 6.5–8.1)

## 2021-01-15 LAB — FIBRIN DERIVATIVES D-DIMER (ARMC ONLY): Fibrin derivatives D-dimer (ARMC): 495.72 ng/mL (FEU) (ref 0.00–499.00)

## 2021-01-15 LAB — CBC WITH DIFFERENTIAL/PLATELET
Abs Immature Granulocytes: 0.11 10*3/uL — ABNORMAL HIGH (ref 0.00–0.07)
Basophils Absolute: 0 10*3/uL (ref 0.0–0.1)
Basophils Relative: 0 %
Eosinophils Absolute: 0 10*3/uL (ref 0.0–0.5)
Eosinophils Relative: 0 %
HCT: 46.4 % (ref 39.0–52.0)
Hemoglobin: 15.8 g/dL (ref 13.0–17.0)
Immature Granulocytes: 1 %
Lymphocytes Relative: 4 %
Lymphs Abs: 0.7 10*3/uL (ref 0.7–4.0)
MCH: 30.2 pg (ref 26.0–34.0)
MCHC: 34.1 g/dL (ref 30.0–36.0)
MCV: 88.7 fL (ref 80.0–100.0)
Monocytes Absolute: 0.7 10*3/uL (ref 0.1–1.0)
Monocytes Relative: 4 %
Neutro Abs: 15.5 10*3/uL — ABNORMAL HIGH (ref 1.7–7.7)
Neutrophils Relative %: 91 %
Platelets: 223 10*3/uL (ref 150–400)
RBC: 5.23 MIL/uL (ref 4.22–5.81)
RDW: 14 % (ref 11.5–15.5)
WBC: 17.1 10*3/uL — ABNORMAL HIGH (ref 4.0–10.5)
nRBC: 0 % (ref 0.0–0.2)

## 2021-01-15 LAB — GLUCOSE, CAPILLARY
Glucose-Capillary: 260 mg/dL — ABNORMAL HIGH (ref 70–99)
Glucose-Capillary: 321 mg/dL — ABNORMAL HIGH (ref 70–99)
Glucose-Capillary: 290 mg/dL — ABNORMAL HIGH (ref 70–99)
Glucose-Capillary: 228 mg/dL — ABNORMAL HIGH (ref 70–99)

## 2021-01-15 LAB — C-REACTIVE PROTEIN: CRP: 4.8 mg/dL — ABNORMAL HIGH (ref <1.0)

## 2021-01-15 MED ORDER — INSULIN ASPART 100 UNIT/ML ~~LOC~~ SOLN
13.0000 [IU] | Freq: Three times a day (TID) | SUBCUTANEOUS | Status: DC
  Administered 2021-01-15 – 2021-01-16 (×4): 13 [IU] via SUBCUTANEOUS
  Filled 2021-01-15 (×4): qty 1

## 2021-01-15 MED ORDER — INSULIN GLARGINE 100 UNIT/ML ~~LOC~~ SOLN
24.0000 [IU] | Freq: Every day | SUBCUTANEOUS | Status: DC
  Administered 2021-01-15: 23:00:00 24 [IU] via SUBCUTANEOUS
  Filled 2021-01-15 (×2): qty 0.24

## 2021-01-15 NOTE — Progress Notes (Addendum)
PROGRESS NOTE    Harold Parker   WKG:[id number]  DOB: [birth year]  PCP: Lajean Manes, MD    DOA: 01/11/2021 LOS: 4   Brief Narrative / Hospital Course to-date   75 y.o. 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  Patient reported he had tested positive for COVID-19 on 1/19.  On arrival patient hypoxic with O2 sat 87% on room air which improved on 3 L/min nasal cannula oxygen.  He tested positive for COVID-19.  Labs were notable for worsening renal function, potassium 5.6.  He met sepsis criteria with he met sepsis criteria with fever 100.8 F, RR 31.   Chest x-ray was negative for infiltrates.  Admitted to hospitalist service for management of acute on chronic respiratory failure with hypoxia due to Covid-19 infection.    Significant Events: -01/11/21 admitted  -1/27 oxygen need 4 >> 6 L/min, severe dyspnea even at rest & with conversation  Date of +Covid Test: 01/04/21  Vaccination status: 2 doses Pfizer 02/06/20, 02/29/20  Assessment & Plan   Principal Problem:   Acute hypoxemic respiratory failure due to COVID-19 [facility]) Active Problems:   CAD (coronary artery disease)   Type II diabetes mellitus with renal manifestations (HCC)   Hyperlipidemia LDL goal <70   HTN (hypertension)   Chronic systolic CHF (congestive heart failure) (HCC)   COPD (chronic obstructive pulmonary disease) (HCC)   GERD (gastroesophageal reflux disease)   Anxiety   Acute renal failure superimposed on stage 2 chronic kidney disease (HCC)   Hyperkalemia   Elevated troponin   Acute on chronic hypoxic respiratory failure due to COVID-19 infection with secondary acute exacerbation of  COPD Patient reported testing positive for COVID-19 on 1/19, confirmed here on admission. Baseline oxygen requirement 2 L/min, reacquiring up to 6 L/min by HFNC when seen this morning and patient significantly dyspneic with any exertion or conversation. Appears Covid infection has exacerbated his COPD. 1/29, 1/30 - still severe DOE, 6 L/min O2 --Completed remdesivir 1/30 --Continue Solu-Medrol 60 mg IV TID --Scheduled Mucinex, Tussionex as needed --Continue inhalers per orders --IS and flutter valve --O2 to maintain sats 88-93%, wean as tolerated --follow inflammatory markers, CMP, CBC daily --prone or side laying as much as possible --monitor volume status, goal net negative --will consult pulm if worsening course, pt follows with Dr. Patsey Berthold, she is aware of admission  Hyperkalemia - POA with K 5.6.  Treated with Lokelma.  Resolved.   --Daily BMP's to monitor --Repeat Lokelma if needed  Leukocytosis - due to steroids.  Pt afebrile.  Monitor CBC and for s/sx's of new infection.  Transaminitis - not POA, likely due to remdesivir which has been completed.  Monitor CMP.  Odynophagia - trial of Carafate, on PPI increase to BID for now.  Suspect underlying esophagitis due to GERD, aggravated by high dose steroids.    AKI superimposed on CKD stage II - Resolved.  Presented with creatinine 1.64, prior creatinine ranging from 0.8-1.1 on chart review. Cr trend: 1.64>> 1.78>> 1.47>>0.89>>1.02 --Resumed on Lasix, losartan --Monitor BMP daily  Steroid-induced hyperglycemia type 2 diabetes with renal manifestations -A1c 7.0 recently, uncontrolled.  Takes Metformin glipizide at home. CBGs elevated due to IV steroids for Covid and  COPD. --Increase Lantus to 24 units daily --increase NovoLog to 13 units 3 times daily with meals --continue sliding scale NovoLog  Headache -likely due to Covid infection. --Fioricet or Tylenol as needed  Troponin elevation -due to demand ischemia in  the setting of worsening hypoxia.  No acute ischemic EKG changes or active chest pain.  Monitor.  Chronic systolic CHF -echo on 99/02/7168 showed EF 45 to 50%.  No pulmonary edema seen on chest x-ray, BNP 181 on admission.  Patient well compensated at this time. --Monitor volume status closely --Resumed Lasix & losartan, AKI resolved --continue metoprolol --I/O's and daily weights  CAD status post CABG -stable.   --Continue aspirin, Zetia, metoprolol.  Hyperlipidemia -continue Zetia.  LDL goal less than 70. Lipid panel this admission: Total cholesterol 70, low LDH 28, LDL 45 at goal, triglycerides 87.  Hypertension -continue home amlodipine.  Resume Lasix and Cozaar, AKI resolved.  IV hydralazine as needed.  COPD -currently with acute exacerbation as above.  Management as above.  GERD -continue Protonix  Anxiety with Panic Attacks -continue home Xanax.  Consider starting SSRI, PRN Vistaril for acute panic.  Will discuss with patient.   Patient BMI: Body mass index is 28.93 kg/m.   DVT prophylaxis: enoxaparin (LOVENOX) injection 40 mg Start: 01/11/21 2200   Diet:  Diet Orders (From admission, onward)    Start     Ordered   01/11/21 1114  Diet Heart Room service appropriate? Yes; Fluid consistency: Thin  Diet effective now       Question Answer Comment  Room service appropriate? Yes   Fluid consistency: Thin      01/11/21 1113            Code Status: Full Code    Subjective 01/15/21    Patient seen at bedside.  Reports ongoing cough with associated chest discomfort.  Still with significant dyspnea on exertion, but he says he did a little better when up at the sink for hygiene today compared to yesterday.     Disposition Plan & Communication   Status is: Inpatient  Remains inpatient appropriate because:Inpatient level of care appropriate due to severity of illness with severe dyspnea on exertion and continuing oxygen requirement of 6 L/min and remains on IV  therapies as above.   Dispo: The patient is from: Home              Anticipated d/c is to: Home              Anticipated d/c date is: 3+ days              Patient currently is not medically stable to d/c.   Difficult to place patient No   Family Communication: spoke with patient's fiance, Arbie Cookey afternoon 1/28   Consults, Procedures, Treatments   Consultants:   none  Procedures:   none  Covid-specific treatments:  Antimicrobials:  Anti-infectives (From admission, onward)   Start     Dose/Rate Route Frequency Ordered Stop   01/12/21 1000  remdesivir 100 mg in sodium chloride 0.9 % 100 mL IVPB       "Followed by" Linked Group Details   100 mg 200 mL/hr over 30 Minutes Intravenous Daily 01/11/21 0729 01/15/21 0910   01/11/21 0830  remdesivir 200 mg in sodium chloride 0.9% 250 mL IVPB       "Followed by" Linked Group Details   200 mg 580 mL/hr over 30 Minutes Intravenous Once 01/11/21 0729 01/11/21 1104  Objective   Vitals:   01/15/21 0437 01/15/21 0732 01/15/21 1201 01/15/21 1205  BP: (!) 147/70 (!) 150/71 (!) 104/44 (!) 137/55  Pulse: 80 76 75 75  Resp: _0 Temp: 97.6 F (36.4 C) 98.1 F (36.7 C) 98.3 F (36.8 C)   TempSrc: Oral     SpO2: 96% 96% 96%   Weight:      Height:        Intake/Output Summary (Last 24 hours) at 01/15/2021 1335 Last data filed at 01/15/2021 1020 Gross per 24 hour  Intake --  Output 1025 ml  Net -1025 ml   Filed Weights   01/11/21 0541 01/13/21 0623 01/14/21 0500  Weight: 78.9 kg 78.2 kg 81.3 kg    Physical Exam:  General exam: awake, alert, no acute distress Respiratory system: poor aeration mildly improved, ongoing expiratory wheezes, inspirations still trigger coughing, increased respiratory effort with conversational dyspnea, on 6 L/min oxygen. Cardiovascular system: RRR, no pedal edema.   Central nervous system: A&O x4. no gross focal neurologic deficits, normal speech Extremities: moves all, no cyanosis,  normal tone   Labs   Data Reviewed: I have personally reviewed following labs and imaging studies  CBC: Recent Labs  Lab 01/11/21 0538 01/12/21 0445 01/13/21 0324 01/14/21 0513 01/15/21 0523  WBC 8.2 7.8 13.6* 16.1* 17.1*  NEUTROABS 5.7 6.3 12.0* 14.3* 15.5*  HGB 16.5 14.6 14.8 16.5 15.8  HCT 49.6 43.3 42.1 48.7 46.4  MCV 91.7 90.2 88.3 89.7 88.7  PLT 160 164 180 241 488   Basic Metabolic Panel: Recent Labs  Lab 01/11/21 0538 01/12/21 0445 01/12/21 1940 01/13/21 0324 01/14/21 0513 01/15/21 0523  NA 135 133*  --  137 143 140  K 5.6* 5.9* 5.4* 5.2* 5.1 4.4  CL 104 105  --  109 110 106  CO2 20* 20*  --  21* 26 26  GLUCOSE 170* 351*  --  335* 254* 244*  BUN 43* 60*  --  72* 49* 42*  CREATININE 1.64* 1.78*  --  1.47* 0.89 1.02  CALCIUM 9.1 8.8*  --  8.8* 9.6 9.1  MG  --  2.0  --  2.2 2.0  --    GFR: Estimated Creatinine Clearance: 61.7 mL/min (by C-G formula based on SCr of 1.02 mg/dL). Liver Function Tests: Recent Labs  Lab 01/11/21 0538 01/12/21 0445 01/13/21 0324 01/14/21 0513 01/15/21 0523  AST _1 34 50*  ALT 35 30 34 49* 110*  ALKPHOS 74 67 64 70 71  BILITOT 0.9 0.6 0.6 0.9 0.9  PROT 7.5 6.6 6.4* 7.5 6.9  ALBUMIN 4.1 3.4* 3.3* 3.8 3.3*   No results for input(s): LIPASE, AMYLASE in the last 168 hours. No results for input(s): AMMONIA in the last 168 hours. Coagulation Profile: No results for input(s): INR, PROTIME in the last 168 hours. Cardiac Enzymes: No results for input(s): CKTOTAL, CKMB, CKMBINDEX, TROPONINI in the last 168 hours. BNP (last 3 results) No results for input(s): PROBNP in the last 8760 hours. HbA1C: No results for input(s): HGBA1C in the last 72 hours. CBG: Recent Labs  Lab 01/14/21 1153 01/14/21 1557 01/14/21 2045 01/15/21 0731 01/15/21 1200  GLUCAP 343* 295* 138* 260* 321*   Lipid Profile: No results for input(s): CHOL, HDL, LDLCALC, TRIG, CHOLHDL, LDLDIRECT in the last 72 hours. Thyroid Function Tests: No  results for input(s): TSH, T4TOTAL, FREET4, T3FREE, THYROIDAB in the last 72 hours. Anemia Panel: Recent Labs    01/13/21 0324 01/14/21 0513  FERRITIN 156 201   Sepsis Labs: Recent Labs  Lab 01/11/21 1215 01/13/21 0324 01/14/21 0513  PROCALCITON 0.24 0.16 0.10    Recent Results (from the past 240 hour(s))  Culture, blood (Routine X 2) w Reflex to ID Panel     Status: None (Preliminary result)   Collection Time: 01/11/21 12:25 PM   Specimen: BLOOD  Result Value Ref Range Status   Specimen Description BLOOD RIGHT [facility]  Final   Special Requests   Final    BOTTLES DRAWN AEROBIC AND ANAEROBIC Blood Culture adequate volume   Culture   Final    NO GROWTH 4 DAYS Performed at [facility], [address] [place], [place] [address]    Report Status PENDING  Incomplete  Culture, blood (Routine X 2) w Reflex to ID Panel     Status: None (Preliminary result)   Collection Time: 01/11/21 12:26 PM   Specimen: BLOOD  Result Value Ref Range Status   Specimen Description BLOOD  LEFT [facility]  Final   Special Requests   Final    BOTTLES DRAWN AEROBIC AND ANAEROBIC Blood Culture adequate volume   Culture   Final    NO GROWTH 4 DAYS Performed at [facility], [address] [place], [place] [address]    Report Status PENDING  Incomplete  Culture, sputum-assessment     Status: None   Collection Time: 01/11/21  3:02 PM   Specimen: Expectorated Sputum  Result Value Ref Range Status   Specimen Description EXPECTORATED SPUTUM  Final   Special Requests NONE  Final   Sputum evaluation   Final    THIS SPECIMEN IS ACCEPTABLE FOR SPUTUM CULTURE Performed at [facility], [address] [place], [place] [address]    Report Status 01/11/2021 FINAL  Final  Culture, respiratory     Status: None   Collection Time: 01/11/21  3:02 PM  Result Value Ref Range Status   Specimen Description   Final    EXPECTORATED SPUTUM Performed at [facility], [address] [place], [place] [address]    Special Requests   Final    NONE Reflexed from [id number] Performed at [facility], [address] [place], [place] [address]    Gram Stain   Final    NO WBC SEEN MODERATE SQUAMOUS EPITHELIAL CELLS PRESENT ABUNDANT GRAM POSITIVE COCCI MODERATE GRAM NEGATIVE RODS RARE GRAM POSITIVE RODS    Culture   Final    MODERATE Normal respiratory flora-no Staph aureus or Pseudomonas seen Performed at [facility], [address] [place], [place] [address]    Report Status 01/14/2021 FINAL  Final  C Difficile Quick Screen w PCR reflex     Status: None   Collection Time: 01/12/21  7:12 PM   Specimen: STOOL  Result Value Ref Range Status   C Diff antigen NEGATIVE NEGATIVE Final   C Diff toxin NEGATIVE NEGATIVE Final   C Diff interpretation No C. difficile detected.  Final    Comment: Performed at [facility], [address], [place], [place] [address]      Imaging Studies   No results found.   Medications   Scheduled Meds: . ALPRAZolam  0.5 mg Oral BID  . amLODipine  5 mg Oral Daily  . arformoterol  15 mcg Nebulization BID  . vitamin C  500 mg Oral Daily  . aspirin EC  81 mg Oral Daily  . cholecalciferol  1,000 Units Oral BH-q7a  .  dextromethorphan-guaiFENesin  1 tablet Oral BID  . enoxaparin (LOVENOX) injection  40 mg Subcutaneous Q24H  . ezetimibe  10 mg Oral QHS  . fluticasone  2 puff Inhalation BID  . folic acid  696 mcg Oral QPM  . furosemide  40 mg Oral Daily  . insulin aspart  0-5 Units Subcutaneous QHS  . insulin aspart  0-9 Units Subcutaneous TID WC  . insulin aspart  8 Units Subcutaneous TID WC  . insulin glargine  18 Units Subcutaneous QHS  . ipratropium  2 puff Inhalation Q4H  . losartan  50 mg Oral Daily  . methylPREDNISolone (SOLU-MEDROL) injection  60 mg Intravenous Q8H  . metoprolol succinate  100 mg Oral Daily  . pantoprazole  40 mg Oral BID  . polyethylene glycol  17 g Oral Daily  . sucralfate  1 g  Oral TID WC & HS  . traZODone  50 mg Oral QHS  . zinc sulfate  220 mg Oral Daily   Continuous Infusions:      LOS: 4 days    Time spent: 25 minutes with > 50% spent in coordination of care or direct patient contact.    Ezekiel Slocumb, DO [facility]  01/15/2021, 1:35 PM    If 7PM-7AM, please contact night-coverage. How to contact the [facility] Attending or Consulting provider [place] or covering provider during after hours [place], for this patient?    1. Check the care team in [facility] and look for a) attending/consulting TR[facility] provider listed and b) the [facility] team listed 2. Log into [email or web address] and use [place] universal password to access. If you do not have the password, please contact the hospital operator. 3. Locate the [facility] provider you are looking for under [facility] and page to a number that you can be directly reached. 4. If you still have difficulty reaching the provider, please page the [facility] (Director on Call) for the Hospitalists listed on amion for assistance.

## 2021-01-16 ENCOUNTER — Ambulatory Visit: Payer: Medicare Other

## 2021-01-16 DIAGNOSIS — U071 COVID-19: Secondary | ICD-10-CM | POA: Diagnosis not present

## 2021-01-16 DIAGNOSIS — I5022 Chronic systolic (congestive) heart failure: Secondary | ICD-10-CM | POA: Diagnosis not present

## 2021-01-16 DIAGNOSIS — J9601 Acute respiratory failure with hypoxia: Secondary | ICD-10-CM | POA: Diagnosis not present

## 2021-01-16 DIAGNOSIS — J449 Chronic obstructive pulmonary disease, unspecified: Secondary | ICD-10-CM | POA: Diagnosis not present

## 2021-01-16 LAB — GLUCOSE, CAPILLARY
Glucose-Capillary: 229 mg/dL — ABNORMAL HIGH (ref 70–99)
Glucose-Capillary: 285 mg/dL — ABNORMAL HIGH (ref 70–99)
Glucose-Capillary: 279 mg/dL — ABNORMAL HIGH (ref 70–99)
Glucose-Capillary: 289 mg/dL — ABNORMAL HIGH (ref 70–99)

## 2021-01-16 LAB — COMPREHENSIVE METABOLIC PANEL
ALT: 79 U/L — ABNORMAL HIGH (ref 0–44)
AST: 27 U/L (ref 15–41)
Albumin: 2.7 g/dL — ABNORMAL LOW (ref 3.5–5.0)
Alkaline Phosphatase: 66 U/L (ref 38–126)
Anion gap: 9 (ref 5–15)
BUN: 43 mg/dL — ABNORMAL HIGH (ref 8–23)
CO2: 27 mmol/L (ref 22–32)
Calcium: 8.9 mg/dL (ref 8.9–10.3)
Chloride: 102 mmol/L (ref 98–111)
Creatinine, Ser: 1.04 mg/dL (ref 0.61–1.24)
GFR, Estimated: >60 mL/min (ref >60)
Glucose, Bld: 239 mg/dL — ABNORMAL HIGH (ref 70–99)
Potassium: 4.4 mmol/L (ref 3.5–5.1)
Sodium: 138 mmol/L (ref 135–145)
Total Bilirubin: 0.8 mg/dL (ref 0.3–1.2)
Total Protein: 5.7 g/dL — ABNORMAL LOW (ref 6.5–8.1)

## 2021-01-16 LAB — CULTURE, BLOOD (ROUTINE X 2)
Culture: NO GROWTH
Culture: NO GROWTH
Special Requests: ADEQUATE
Special Requests: ADEQUATE

## 2021-01-16 LAB — CBC WITH DIFFERENTIAL/PLATELET
Abs Immature Granulocytes: 0.11 10*3/uL — ABNORMAL HIGH (ref 0.00–0.07)
Basophils Absolute: 0 10*3/uL (ref 0.0–0.1)
Basophils Relative: 0 %
Eosinophils Absolute: 0 10*3/uL (ref 0.0–0.5)
Eosinophils Relative: 0 %
HCT: 41.9 % (ref 39.0–52.0)
Hemoglobin: 14.6 g/dL (ref 13.0–17.0)
Immature Granulocytes: 1 %
Lymphocytes Relative: 5 %
Lymphs Abs: 0.8 10*3/uL (ref 0.7–4.0)
MCH: 30.7 pg (ref 26.0–34.0)
MCHC: 34.8 g/dL (ref 30.0–36.0)
MCV: 88.2 fL (ref 80.0–100.0)
Monocytes Absolute: 0.7 10*3/uL (ref 0.1–1.0)
Monocytes Relative: 5 %
Neutro Abs: 12.4 10*3/uL — ABNORMAL HIGH (ref 1.7–7.7)
Neutrophils Relative %: 89 %
Platelets: 203 10*3/uL (ref 150–400)
RBC: 4.75 MIL/uL (ref 4.22–5.81)
RDW: 14 % (ref 11.5–15.5)
WBC: 14 10*3/uL — ABNORMAL HIGH (ref 4.0–10.5)
nRBC: 0 % (ref 0.0–0.2)

## 2021-01-16 LAB — C-REACTIVE PROTEIN: CRP: 3.7 mg/dL — ABNORMAL HIGH (ref <1.0)

## 2021-01-16 LAB — FIBRIN DERIVATIVES D-DIMER (ARMC ONLY): Fibrin derivatives D-dimer (ARMC): 525.87 ng/mL (FEU) — ABNORMAL HIGH (ref 0.00–499.00)

## 2021-01-16 MED ORDER — IPRATROPIUM-ALBUTEROL 0.5-2.5 (3) MG/3ML IN SOLN
3.0000 mL | Freq: Four times a day (QID) | RESPIRATORY_TRACT | Status: DC
  Administered 2021-01-16 – 2021-01-17 (×3): 3 mL via RESPIRATORY_TRACT
  Filled 2021-01-16 (×3): qty 3

## 2021-01-16 MED ORDER — INSULIN ASPART 100 UNIT/ML ~~LOC~~ SOLN
18.0000 [IU] | Freq: Three times a day (TID) | SUBCUTANEOUS | Status: DC
  Administered 2021-01-17: 09:00:00 18 [IU] via SUBCUTANEOUS
  Filled 2021-01-16: qty 1

## 2021-01-16 MED ORDER — INSULIN ASPART 100 UNIT/ML ~~LOC~~ SOLN
0.0000 [IU] | Freq: Three times a day (TID) | SUBCUTANEOUS | Status: DC
  Administered 2021-01-17: 11 [IU] via SUBCUTANEOUS
  Administered 2021-01-17: 13:00:00 30 [IU] via SUBCUTANEOUS
  Administered 2021-01-17 – 2021-01-18 (×2): 5 [IU] via SUBCUTANEOUS
  Administered 2021-01-18: 11 [IU] via SUBCUTANEOUS
  Administered 2021-01-18: 17:00:00 15 [IU] via SUBCUTANEOUS
  Administered 2021-01-19: 2 [IU] via SUBCUTANEOUS
  Administered 2021-01-19 (×2): 5 [IU] via SUBCUTANEOUS
  Administered 2021-01-20: 8 [IU] via SUBCUTANEOUS
  Administered 2021-01-20: 5 [IU] via SUBCUTANEOUS
  Administered 2021-01-20 – 2021-01-21 (×2): 8 [IU] via SUBCUTANEOUS
  Administered 2021-01-21: 15 [IU] via SUBCUTANEOUS
  Administered 2021-01-21: 3 [IU] via SUBCUTANEOUS
  Administered 2021-01-22: 4 [IU] via SUBCUTANEOUS
  Administered 2021-01-22 (×2): 11 [IU] via SUBCUTANEOUS
  Administered 2021-01-23: 16:00:00 8 [IU] via SUBCUTANEOUS
  Administered 2021-01-23: 5 [IU] via SUBCUTANEOUS
  Administered 2021-01-23: 3 [IU] via SUBCUTANEOUS
  Administered 2021-01-24: 5 [IU] via SUBCUTANEOUS
  Administered 2021-01-24: 8 [IU] via SUBCUTANEOUS
  Filled 2021-01-16 (×23): qty 1

## 2021-01-16 MED ORDER — INSULIN GLARGINE 100 UNIT/ML ~~LOC~~ SOLN
28.0000 [IU] | Freq: Every day | SUBCUTANEOUS | Status: DC
  Administered 2021-01-16 – 2021-01-23 (×8): 28 [IU] via SUBCUTANEOUS
  Filled 2021-01-16 (×10): qty 0.28

## 2021-01-16 NOTE — Progress Notes (Signed)
PT Cancellation Note  Patient Details Name: Harold Parker MRN: [id number] DOB: Mar 29, 1946   Cancelled Treatment:    Reason Eval/Treat Not Completed:  (Treatment session attempted. Upon arrival to room, patient just completed ADL routine, notably SOB (sats >90% on 6L).  Declined participation with additional gait efforts; requesting therapist re-attempt in PM.  Will continue efforts as appropriate.)  Puja Caffey H. Manson Passey, PT, DPT, NCS 01/16/21, 11:26 AM [phone]

## 2021-01-16 NOTE — Progress Notes (Signed)
Inpatient Diabetes Program Recommendations  AACE/ADA: New Consensus Statement on Inpatient Glycemic Control (2015)  Target Ranges:  Prepandial:   less than 140 mg/dL      Peak postprandial:   less than 180 mg/dL (1-2 hours)      Critically ill patients:  140 - 180 mg/dL   Lab Results  Component Value Date   GLUCAP 285 (H) 01/16/2021   HGBA1C 7.5 (H) 01/12/2021    Review of Glycemic Control Results for Harold Parker, Harold Parker (MRN [id number]) as of 01/16/2021 14:51  Ref. Range 01/15/2021 07:31 01/15/2021 12:00 01/15/2021 16:27 01/15/2021 20:53 01/16/2021 08:27 01/16/2021 12:50  Glucose-Capillary Latest Ref Range: 70 - 99 mg/dL 660 (H) 630 (H) 160 (H) 228 (H) 229 (H) 285 (H)   Diabetes history: DM2 Outpatient Diabetes medications: Metformin 1000 mg BID, Glipizide 5 mg daily (takes Glipizide 5 mg BID on Monday, Wednesday, Friday) Current orders for Inpatient glycemic control: Lantus 24 units QHS, Novolog 13 units tid meal coverage, Novolog 0-9 units TID with meals, Novolog 0-5 units QHS; Solumedrol 60 mg Q8H  Inpatient Diabetes Program Recommendations:   Consider increase in Novolog correction to moderate while on steroids. CBGs 260-321 May also need increase in meal coverage.  Thank you, Billy Fischer. Averill Winters, RN, MSN, CDE  Diabetes Coordinator Inpatient Glycemic Control Team Team Pager [phone] (8am-5pm) 01/16/2021 2:58 PM

## 2021-01-16 NOTE — Progress Notes (Signed)
Assumed care of patient at 1500. Jahdiel Krol S, RN  

## 2021-01-16 NOTE — Progress Notes (Signed)
PROGRESS NOTE    Harold Parker   RDE:[id number]  DOB: 1946/09/26  PCP: Lajean Manes, MD    DOA: 01/11/2021 LOS: 5   Brief Narrative / Hospital Course to-date   75 y.o. 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  Patient reported he had tested positive for COVID-19 on 1/19.  On arrival patient hypoxic with O2 sat 87% on room air which improved on 3 L/min nasal cannula oxygen.  He tested positive for COVID-19.  Labs were notable for worsening renal function, potassium 5.6.  He met sepsis criteria with he met sepsis criteria with fever 100.8 F, RR 31.   Chest x-ray was negative for infiltrates.  Admitted to hospitalist service for management of acute on chronic respiratory failure with hypoxia due to Covid-19 infection.    Significant Events: -01/11/21 admitted  -1/27 oxygen need 4 >> 6 L/min, severe dyspnea even at rest & with conversation  Date of +Covid Test: 01/04/21  Vaccination status: 2 doses Pfizer 02/06/20, 02/29/20  Assessment & Plan   Principal Problem:   Acute hypoxemic respiratory failure due to COVID-19 [facility]) Active Problems:   CAD (coronary artery disease)   Type II diabetes mellitus with renal manifestations (HCC)   Hyperlipidemia LDL goal <70   HTN (hypertension)   Chronic systolic CHF (congestive heart failure) (HCC)   COPD (chronic obstructive pulmonary disease) (HCC)   GERD (gastroesophageal reflux disease)   Anxiety   Acute renal failure superimposed on stage 2 chronic kidney disease (HCC)   Hyperkalemia   Elevated troponin   Acute on chronic hypoxic respiratory failure due to COVID-19 infection with secondary acute exacerbation of  COPD Patient reported testing positive for COVID-19 on 1/19, confirmed here on admission. Baseline oxygen requirement 2 L/min, reacquiring up to 6 L/min by HFNC when seen this morning and patient significantly dyspneic with any exertion or conversation. Appears Covid infection has exacerbated his COPD. 1/29, 1/30, 1/31 - still severe DOE, 6 L/min O2 - stable but only minimal improvement thus far --Completed remdesivir 1/30 --Continue Solu-Medrol 60 mg IV TID --Add scheduled Duonebs to regimen of inhalers, inhalers not enough --Scheduled Mucinex, Tussionex as needed --Continue inhalers per orders --IS and flutter valve --O2 to maintain sats 88-93%, wean as tolerated --follow inflammatory markers, CMP, CBC daily --prone or side laying as much as possible --monitor volume status, goal net negative --will consult pulm if worsening course, pt follows with Dr. Patsey Berthold, she is aware of admission  Hyperkalemia - POA with K 5.6.  Treated with Lokelma. Resolved.   --Daily BMP's to monitor --Repeat Lokelma if needed  Leukocytosis - due to steroids.  Pt afebrile.  Monitor CBC and for s/sx's of new infection.  Transaminitis - not POA, likely due to remdesivir which has been completed.  Monitor CMP.  Odynophagia - trial of Carafate, on PPI increase to BID for now.  Suspect underlying esophagitis due to GERD, aggravated by high dose steroids.    AKI superimposed on CKD stage II - Resolved.  Presented with creatinine 1.64, prior creatinine ranging from 0.8-1.1 on chart review. Cr trend: 1.64>> 1.78>> 1.47>>0.89>>1.02 --Resumed on Lasix, losartan --Monitor BMP daily  Steroid-induced hyperglycemia type 2 diabetes with renal manifestations -A1c  7.0 recently, uncontrolled.  Takes Metformin glipizide at home. CBGs elevated due to IV steroids for Covid and COPD. --Increase Lantus to 28 units daily --increase NovoLog to 18 units 3 times daily with meals --continue sliding scale NovoLog -  increased to moderate   Headache -likely due to Covid infection. --Fioricet or Tylenol as needed  Troponin elevation -due to demand ischemia in the setting of worsening hypoxia.  No acute ischemic EKG changes or active chest pain.  Monitor.  Chronic systolic CHF -echo on 38/08/3733 showed EF 45 to 50%.  No pulmonary edema seen on chest x-ray, BNP 181 on admission.  Patient well compensated at this time. --Monitor volume status closely --Resumed Lasix & losartan, AKI resolved --continue metoprolol --I/O's and daily weights  CAD status post CABG -stable.   --Continue aspirin, Zetia, metoprolol.  Hyperlipidemia -continue Zetia.  LDL goal less than 70. Lipid panel this admission: Total cholesterol 70, low LDH 28, LDL 45 at goal, triglycerides 87.  Hypertension -continue home amlodipine.  Resume Lasix and Cozaar, AKI resolved.  IV hydralazine as needed.  COPD -currently with acute exacerbation as above.  Management as above.  GERD -continue Protonix  Anxiety with Panic Attacks -continue home Xanax.  Consider starting SSRI, PRN Vistaril for acute panic.  Will discuss with patient.   Patient BMI: Body mass index is 28.93 kg/m.   DVT prophylaxis: enoxaparin (LOVENOX) injection 40 mg Start: 01/11/21 2200   Diet:  Diet Orders (From admission, onward)    Start     Ordered   01/11/21 1114  Diet Heart Room service appropriate? Yes; Fluid consistency: Thin  Diet effective now       Question Answer Comment  Room service appropriate? Yes   Fluid consistency: Thin      01/11/21 1113            Code Status: Full Code    Subjective 01/16/21    Patient seen sitting edge of bed.  Continues to have significant dyspnea, worse with exertion, but says feeling a little better.  Tolerated personal hygiene standing at sink.  Says he is happy here, getting good care and know his lungs will take time to improve.     Disposition Plan & Communication   Status is:  Inpatient  Remains inpatient appropriate because:Inpatient level of care appropriate due to severity of illness with severe dyspnea on exertion and continuing oxygen requirement of 6 L/min and remains on IV therapies as above.   Dispo: The patient is from: Home              Anticipated d/c is to: Home              Anticipated d/c date is: 3 days              Patient currently is not medically stable to d/c.   Difficult to place patient No   Family Communication: spoke with patient's fiance, Arbie Cookey afternoon 1/28.   Will attempt to call this afternoon.     Consults, Procedures, Treatments   Consultants:   none  Procedures:   none  Covid-specific treatments:  Antimicrobials:  Anti-infectives (From admission, onward)   Start     Dose/Rate Route Frequency Ordered Stop   01/12/21 1000  remdesivir 100 mg in sodium chloride 0.9 % 100 mL IVPB       "Followed by" Linked Group Details   100 mg 200 mL/hr over 30 Minutes Intravenous Daily 01/11/21 0729 01/15/21 0910   01/11/21 0830  remdesivir 200 mg in sodium chloride 0.9% 250 mL IVPB       "Followed by" Linked Group Details   200 mg 580 mL/hr over 30 Minutes Intravenous Once 01/11/21 0729 01/11/21 1104        Objective   Vitals:   01/15/21 2014 01/16/21 0509 01/16/21 0824 01/16/21 1552  BP: (!) 150/61 (!) 153/65 (!) 169/63 140/69  Pulse: 73 65 72 70  Resp: $Remo'18 16 16 17  'PhVlO$ Temp: 98.3 F (36.8 C) 97.7 F (36.5 C) 98.6 F (37 C) 98.2 F (36.8 C)  TempSrc: Oral Oral Oral Oral  SpO2: 94% 95% 97% 96%  Weight:      Height:       No intake or output data in the 24 hours ending 01/16/21 1658 Filed Weights   01/11/21 0541 01/13/21 0623 01/14/21 0500  Weight: 78.9 kg 78.2 kg 81.3 kg    Physical Exam:  General exam: awake, alert, no acute distress Respiratory system: aeration improved, currently no wheezes, still has conversational dyspnea but improved, still on 6 L/min oxygen. Cardiovascular system: RRR, no pedal edema.    Central nervous system: A&O x4. no gross focal neurologic deficits, normal speech GI: soft, non-tender  Labs   Data Reviewed: I have personally reviewed following labs and imaging studies  CBC: Recent Labs  Lab 01/12/21 0445 01/13/21 0324 01/14/21 0513 01/15/21 0523 01/16/21 0507  WBC 7.8 13.6* 16.1* 17.1* 14.0*  NEUTROABS 6.3 12.0* 14.3* 15.5* 12.4*  HGB 14.6 14.8 16.5 15.8 14.6  HCT 43.3 42.1 48.7 46.4 41.9  MCV 90.2 88.3 89.7 88.7 88.2  PLT 164 180 241 223 382   Basic Metabolic Panel: Recent Labs  Lab 01/12/21 0445 01/12/21 1940 01/13/21 0324 01/14/21 0513 01/15/21 0523 01/16/21 0507  NA 133*  --  137 143 140 138  K 5.9* 5.4* 5.2* 5.1 4.4 4.4  CL 105  --  109 110 106 102  CO2 20*  --  21* $Re'26 26 27  'Aqh$ GLUCOSE 351*  --  335* 254* 244* 239*  BUN 60*  --  72* 49* 42* 43*  CREATININE 1.78*  --  1.47* 0.89 1.02 1.04  CALCIUM 8.8*  --  8.8* 9.6 9.1 8.9  MG 2.0  --  2.2 2.0  --   --    GFR: Estimated Creatinine Clearance: 60.5 mL/min (by C-G formula based on SCr of 1.04 mg/dL). Liver Function Tests: Recent Labs  Lab 01/12/21 0445 01/13/21 0324 01/14/21 0513 01/15/21 0523 01/16/21 0507  AST 27 31 34 50* 27  ALT 30 34 49* 110* 79*  ALKPHOS 67 64 70 71 66  BILITOT 0.6 0.6 0.9 0.9 0.8  PROT 6.6 6.4* 7.5 6.9 5.7*  ALBUMIN 3.4* 3.3* 3.8 3.3* 2.7*   No results for input(s): LIPASE, AMYLASE in the last 168 hours. No results for input(s): AMMONIA in the last 168 hours. Coagulation Profile: No results for input(s): INR, PROTIME in the last 168 hours. Cardiac Enzymes: No results for input(s): CKTOTAL, CKMB, CKMBINDEX, TROPONINI in the last 168 hours. BNP (last 3 results) No results for input(s): PROBNP in the last 8760 hours. HbA1C: No results for input(s): HGBA1C in the last 72 hours. CBG: Recent Labs  Lab 01/15/21 1627 01/15/21 2053 01/16/21 0827 01/16/21 1250 01/16/21 1557  GLUCAP 290* 228* 229* 285* 279*   Lipid Profile: No results for input(s): CHOL,  HDL, LDLCALC, TRIG, CHOLHDL, LDLDIRECT in the last 72 hours. Thyroid Function Tests: No results for input(s): TSH, T4TOTAL, FREET4, T3FREE, THYROIDAB in  the last 72 hours. Anemia Panel: Recent Labs    01/14/21 0513  FERRITIN 201   Sepsis Labs: Recent Labs  Lab 01/11/21 1215 01/13/21 0324 01/14/21 0513  PROCALCITON 0.24 0.16 0.10    Recent Results (from the past 240 hour(s))  Culture, blood (Routine X 2) w Reflex to ID Panel     Status: None   Collection Time: 01/11/21 12:25 PM   Specimen: BLOOD  Result Value Ref Range Status   Specimen Description BLOOD RIGHT [facility]  Final   Special Requests   Final    BOTTLES DRAWN AEROBIC AND ANAEROBIC Blood Culture adequate volume   Culture   Final    NO GROWTH 5 DAYS Performed at [facility], [address] [place], [place] [address]    Report Status 01/16/2021 FINAL  Final  Culture, blood (Routine X 2) w Reflex to ID Panel     Status: None   Collection Time: 01/11/21 12:26 PM   Specimen: BLOOD  Result Value Ref Range Status   Specimen Description BLOOD  LEFT [facility]  Final   Special Requests   Final    BOTTLES DRAWN AEROBIC AND ANAEROBIC Blood Culture adequate volume   Culture   Final    NO GROWTH 5 DAYS Performed at [facility], [address], [place], [place] [address]    Report Status 01/16/2021 FINAL  Final  Culture, sputum-assessment     Status: None   Collection Time: 01/11/21  3:02 PM   Specimen: Expectorated Sputum  Result Value Ref Range Status   Specimen Description EXPECTORATED SPUTUM  Final   Special Requests NONE  Final   Sputum evaluation   Final    THIS SPECIMEN IS ACCEPTABLE FOR SPUTUM CULTURE Performed at [facility], [address] [place], [place] [address]    Report Status 01/11/2021 FINAL  Final  Culture, respiratory     Status: None   Collection Time: 01/11/21  3:02 PM  Result Value Ref Range Status   Specimen Description   Final    EXPECTORATED SPUTUM Performed at  [facility], [address] [place], [place] [address]    Special Requests   Final    NONE Reflexed from [id number] Performed at [facility], [address] [place], [place] [address]    Gram Stain   Final    NO WBC SEEN MODERATE SQUAMOUS EPITHELIAL CELLS PRESENT ABUNDANT GRAM POSITIVE COCCI MODERATE GRAM NEGATIVE RODS RARE GRAM POSITIVE RODS    Culture   Final    MODERATE Normal respiratory flora-no Staph aureus or Pseudomonas seen Performed at [facility], [address] [place], [place] [address]    Report Status 01/14/2021 FINAL  Final  C Difficile Quick Screen w PCR reflex     Status: None   Collection Time: 01/12/21  7:12 PM   Specimen: STOOL  Result Value Ref Range Status   C Diff antigen NEGATIVE NEGATIVE Final   C Diff toxin NEGATIVE NEGATIVE Final   C Diff interpretation No C. difficile detected.  Final    Comment: Performed at [facility], [address], [place], [place] [address]      Imaging Studies   No results found.   Medications   Scheduled Meds: . ALPRAZolam  0.5 mg Oral BID  . amLODipine  5 mg Oral Daily  . arformoterol  15 mcg Nebulization BID  . vitamin C  500 mg Oral Daily  . aspirin EC  81 mg Oral  Daily  . cholecalciferol  1,000 Units Oral BH-q7a  . dextromethorphan-guaiFENesin  1 tablet Oral BID  . enoxaparin (LOVENOX) injection  40 mg Subcutaneous Q24H  . ezetimibe  10 mg Oral QHS  . fluticasone  2 puff Inhalation BID  . folic acid  412 mcg Oral QPM  . furosemide  40 mg Oral Daily  . insulin aspart  0-5 Units Subcutaneous QHS  . insulin aspart  0-9 Units Subcutaneous TID WC  . insulin aspart  13 Units Subcutaneous TID WC  . insulin glargine  24 Units Subcutaneous QHS  . ipratropium-albuterol  3 mL Nebulization Q6H WA  . losartan  50 mg Oral Daily  . methylPREDNISolone (SOLU-MEDROL) injection  60 mg Intravenous Q8H  . metoprolol succinate  100 mg Oral Daily  . pantoprazole  40 mg Oral BID  .  polyethylene glycol  17 g Oral Daily  . sucralfate  1 g Oral TID WC & HS  . traZODone  50 mg Oral QHS  . zinc sulfate  220 mg Oral Daily   Continuous Infusions:      LOS: 5 days    Time spent: 25 minutes with > 50% spent in coordination of care or direct patient contact.    Ezekiel Slocumb, DO [facility]  01/16/2021, 4:58 PM    If 7PM-7AM, please contact night-coverage. How to contact the [facility] Attending or Consulting provider [place] or covering provider during after hours [place], for this patient?    1. Check the care team in [facility] and look for a) attending/consulting [facility] provider listed and b) the [facility] team listed 2. Log into [email or web address] and use Parker's universal password to access. If you do not have the password, please contact the hospital operator. 3. Locate the [facility] provider you are looking for under [facility] and page to a number that you can be directly reached. 4. If you still have difficulty reaching the provider, please page the [facility] (Director on Call) for the Hospitalists listed on amion for assistance.

## 2021-01-17 DIAGNOSIS — I5022 Chronic systolic (congestive) heart failure: Secondary | ICD-10-CM | POA: Diagnosis not present

## 2021-01-17 DIAGNOSIS — U071 COVID-19: Secondary | ICD-10-CM | POA: Diagnosis not present

## 2021-01-17 DIAGNOSIS — J449 Chronic obstructive pulmonary disease, unspecified: Secondary | ICD-10-CM | POA: Diagnosis not present

## 2021-01-17 DIAGNOSIS — J9601 Acute respiratory failure with hypoxia: Secondary | ICD-10-CM | POA: Diagnosis not present

## 2021-01-17 LAB — GLUCOSE, CAPILLARY
Glucose-Capillary: 240 mg/dL — ABNORMAL HIGH (ref 70–99)
Glucose-Capillary: 457 mg/dL — ABNORMAL HIGH (ref 70–99)
Glucose-Capillary: 343 mg/dL — ABNORMAL HIGH (ref 70–99)
Glucose-Capillary: 143 mg/dL — ABNORMAL HIGH (ref 70–99)

## 2021-01-17 LAB — C-REACTIVE PROTEIN: CRP: 1.4 mg/dL — ABNORMAL HIGH (ref <1.0)

## 2021-01-17 LAB — COMPREHENSIVE METABOLIC PANEL
ALT: 69 U/L — ABNORMAL HIGH (ref 0–44)
AST: 24 U/L (ref 15–41)
Albumin: 2.7 g/dL — ABNORMAL LOW (ref 3.5–5.0)
Alkaline Phosphatase: 67 U/L (ref 38–126)
Anion gap: 9 (ref 5–15)
BUN: 45 mg/dL — ABNORMAL HIGH (ref 8–23)
CO2: 28 mmol/L (ref 22–32)
Calcium: 9.2 mg/dL (ref 8.9–10.3)
Chloride: 104 mmol/L (ref 98–111)
Creatinine, Ser: 0.96 mg/dL (ref 0.61–1.24)
GFR, Estimated: >60 mL/min (ref >60)
Glucose, Bld: 265 mg/dL — ABNORMAL HIGH (ref 70–99)
Potassium: 4.4 mmol/L (ref 3.5–5.1)
Sodium: 141 mmol/L (ref 135–145)
Total Bilirubin: 0.9 mg/dL (ref 0.3–1.2)
Total Protein: 5.8 g/dL — ABNORMAL LOW (ref 6.5–8.1)

## 2021-01-17 LAB — FIBRIN DERIVATIVES D-DIMER (ARMC ONLY): Fibrin derivatives D-dimer (ARMC): 599.5 ng/mL (FEU) — ABNORMAL HIGH (ref 0.00–499.00)

## 2021-01-17 MED ORDER — INSULIN ASPART 100 UNIT/ML ~~LOC~~ SOLN
25.0000 [IU] | Freq: Three times a day (TID) | SUBCUTANEOUS | Status: DC
  Administered 2021-01-17 – 2021-01-22 (×14): 25 [IU] via SUBCUTANEOUS
  Filled 2021-01-17 (×14): qty 1

## 2021-01-17 MED ORDER — PHENOL 1.4 % MT LIQD
1.0000 | OROMUCOSAL | Status: DC | PRN
  Administered 2021-01-23: 14:00:00 1 via OROMUCOSAL
  Filled 2021-01-17: qty 177

## 2021-01-17 MED ORDER — MENTHOL 3 MG MT LOZG
1.0000 | LOZENGE | OROMUCOSAL | Status: DC | PRN
  Administered 2021-01-23: 14:00:00 3 mg via ORAL
  Filled 2021-01-17 (×2): qty 9

## 2021-01-17 MED ORDER — SALINE SPRAY 0.65 % NA SOLN
1.0000 | NASAL | Status: DC | PRN
  Administered 2021-01-17: 21:00:00 1 via NASAL
  Filled 2021-01-17: qty 44

## 2021-01-17 NOTE — Progress Notes (Signed)
PROGRESS NOTE    Harold Parker   BTD:[id number]  DOB: [birth year]  PCP: Lajean Manes, MD    DOA: 01/11/2021 LOS: 6   Brief Narrative / Hospital Course to-date   75 y.o. 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  Patient reported he had tested positive for COVID-19 on 1/19.  On arrival patient hypoxic with O2 sat 87% on room air which improved on 3 L/min nasal cannula oxygen.  He tested positive for COVID-19.  Labs were notable for worsening renal function, potassium 5.6.  He met sepsis criteria with he met sepsis criteria with fever 100.8 F, RR 31.   Chest x-ray was negative for infiltrates.  Admitted to hospitalist service for management of acute on chronic respiratory failure with hypoxia due to Covid-19 infection.    Significant Events: -01/11/21 admitted  -1/27 oxygen need 4 >> 6 L/min, severe dyspnea even at rest & with conversation  Date of +Covid Test: 01/04/21  Vaccination status: 2 doses Pfizer 02/06/20, 02/29/20  Assessment & Plan   Principal Problem:   Acute hypoxemic respiratory failure due to COVID-19 [facility]) Active Problems:   CAD (coronary artery disease)   Type II diabetes mellitus with renal manifestations (HCC)   Hyperlipidemia LDL goal <70   HTN (hypertension)   Chronic systolic CHF (congestive heart failure) (HCC)   COPD (chronic obstructive pulmonary disease) (HCC)   GERD (gastroesophageal reflux disease)   Anxiety   Acute renal failure superimposed on stage 2 chronic kidney disease (HCC)   Hyperkalemia   Elevated troponin   Acute on chronic hypoxic respiratory failure due to COVID-19 infection with secondary acute exacerbation of  COPD Patient reported testing positive for COVID-19 on 1/19, confirmed here on admission. Baseline oxygen requirement 2 L/min, requiring up to 6 L/min by HFNC and significantly dyspneic with any exertion or conversation.  Appears Covid infection has exacerbated his COPD. 1/29 -2/1: Remains very dyspneic with exertion, 6 L/min O2 - stable. Slow improvement due to underlying COPD. --Completed remdesivir 1/30 --Continue Solu-Medrol 60 mg IV TID --Add scheduled Duonebs to regimen of inhalers, inhalers not enough --Scheduled Mucinex, Tussionex as needed --Continue inhalers per orders --IS and flutter valve --O2 to maintain sats 88-93%, wean as tolerated --follow inflammatory markers, CMP, CBC daily --prone or side laying as much as possible --monitor volume status, goal net negative --will consult pulm if worsening course, pt follows with Dr. Patsey Berthold, she is aware of admission  Hyperkalemia - POA with K 5.6.  Treated with Lokelma. Resolved.   --Daily BMP's to monitor --Repeat Lokelma if needed  Leukocytosis - due to steroids.  Pt afebrile.  Monitor CBC and for s/sx's of new infection.  Transaminitis - not POA, likely due to remdesivir which has been completed.  Monitor CMP.  Odynophagia - trial of Carafate, on PPI increase to BID for now.  Suspect underlying esophagitis due to GERD, aggravated by high dose steroids.    AKI superimposed on CKD stage II - Resolved.  Presented with creatinine 1.64, prior creatinine ranging from 0.8-1.1 on chart review. Cr trend: 1.64>> 1.78>> 1.47>>0.89>>1.02...0.96 --Resumed on Lasix, losartan --Monitor BMP daily  Steroid-induced hyperglycemia type 2 diabetes with renal manifestations -A1c 7.0 recently, uncontrolled.  Takes Metformin glipizide at home, held. CBGs elevated due to IV steroids for Covid and COPD. --Continue Lantus to 28 units daily --increase NovoLog to 25 units 3 times daily with meals --continue moderate sliding scale NovoLog   --Diabetes coordinator following, recs appreciated  Sore throat -due to irritation from coughing.  Chloraseptic spray and lozenges ordered as needed.  Headache -likely due to Covid infection. --Fioricet or Tylenol as needed  Troponin elevation -due to demand ischemia in the setting of worsening hypoxia.  No acute ischemic EKG changes or active chest pain.  Monitor.  Chronic systolic CHF -echo on 93/01/6711 showed EF 45 to 50%.  No pulmonary edema seen on chest x-ray, BNP 181 on admission.  Patient well compensated at this time. --Monitor volume status closely --Resumed Lasix & losartan, AKI resolved --continue metoprolol --I/O's and daily weights  CAD status post CABG -stable.   --Continue aspirin, Zetia, metoprolol.  Hyperlipidemia -continue Zetia.  LDL goal less than 70. Lipid panel this admission: Total cholesterol 70, low LDH 28, LDL 45 at goal, triglycerides 87.  Hypertension -continue home amlodipine.  Resume Lasix and Cozaar, AKI resolved.  IV hydralazine as needed.  COPD -currently with acute exacerbation as above.  Management as above.  GERD -continue Protonix  Anxiety with Panic Attacks -continue home Xanax.  Consider starting SSRI, PRN Vistaril for acute panic.  Will discuss with patient.   Patient BMI: Body mass index is 29.18 kg/m.   DVT prophylaxis: enoxaparin (LOVENOX) injection 40 mg Start: 01/11/21 2200   Diet:  Diet Orders (From admission, onward)    Start     Ordered   01/11/21 1114  Diet Heart Room service appropriate? Yes; Fluid consistency: Thin  Diet effective now       Question Answer Comment  Room service appropriate? Yes   Fluid consistency: Thin      01/11/21 1113            Code Status: Full Code    Subjective 01/17/21    Patient seated edge of bed when seen today.  Reports throat a little more sore today secondary to coughing.  Still feels short of breath with little exertion, but says slowly improving.  Says he realizes he  may take some time for significant provement due to COPD.   Disposition Plan & Communication   Status is: Inpatient  Remains inpatient appropriate because:Inpatient level of care appropriate due to severity of illness with ongoing significant dyspnea on exertion and continuing oxygen requirement of 6 L/min and remains on IV therapies as above.   Dispo: The patient is from: Home              Anticipated d/c is to: Home              Anticipated d/c date is: 3 days              Patient currently is not medically stable to d/c.   Difficult to place patient No   Family Communication: spoke with patient's fiance, Arbie Cookey afternoon 1/28.   Will attempt to call this afternoon.     Consults, Procedures, Treatments   Consultants:   None  Procedures:   none  Covid-specific treatments:  Antimicrobials:  Anti-infectives (From admission, onward)   Start     Dose/Rate Route Frequency Ordered Stop   01/12/21 1000  remdesivir 100 mg in sodium chloride 0.9 % 100 mL IVPB       "Followed by" Linked Group Details   100 mg  200 mL/hr over 30 Minutes Intravenous Daily 01/11/21 0729 01/15/21 0910   01/11/21 0830  remdesivir 200 mg in sodium chloride 0.9% 250 mL IVPB       "Followed by" Linked Group Details   200 mg 580 mL/hr over 30 Minutes Intravenous Once 01/11/21 0729 01/11/21 1104        Objective   Vitals:   01/17/21 0443 01/17/21 0500 01/17/21 0831 01/17/21 1129  BP: (!) 150/59  (!) 144/55 (!) 123/50  Pulse: 65  67 71  Resp: $Remo'17  16 20  'RoEZK$ Temp: 97.9 F (36.6 C)  97.9 F (36.6 C) 98 F (36.7 C)  TempSrc:    Oral  SpO2: 94%  95% 96%  Weight:  82 kg    Height:        Intake/Output Summary (Last 24 hours) at 01/17/2021 1614 Last data filed at 01/17/2021 1447 Gross per 24 hour  Intake --  Output 2400 ml  Net -2400 ml   Filed Weights   01/13/21 0623 01/14/21 0500 01/17/21 0500  Weight: 78.2 kg 81.3 kg 82 kg    Physical Exam:  General exam: awake, alert, no acute  distress Respiratory system: Remains on 6 L/min oxygen, work of breathing slowly improving, aeration is improving, overall lungs clear bilaterally with no wheezes or rhonchi. Cardiovascular system: RRR, no pedal edema.   Central nervous system: A&O x4. no gross focal neurologic deficits, normal speech Extremities: Moves all, no cyanosis, normal tone  Labs   Data Reviewed: I have personally reviewed following labs and imaging studies  CBC: Recent Labs  Lab 01/12/21 0445 01/13/21 0324 01/14/21 0513 01/15/21 0523 01/16/21 0507  WBC 7.8 13.6* 16.1* 17.1* 14.0*  NEUTROABS 6.3 12.0* 14.3* 15.5* 12.4*  HGB 14.6 14.8 16.5 15.8 14.6  HCT 43.3 42.1 48.7 46.4 41.9  MCV 90.2 88.3 89.7 88.7 88.2  PLT 164 180 241 223 102   Basic Metabolic Panel: Recent Labs  Lab 01/12/21 0445 01/12/21 1940 01/13/21 0324 01/14/21 0513 01/15/21 0523 01/16/21 0507 01/17/21 0730  NA 133*  --  137 143 140 138 141  K 5.9*   < > 5.2* 5.1 4.4 4.4 4.4  CL 105  --  109 110 106 102 104  CO2 20*  --  21* $Re'26 26 27 28  'EwW$ GLUCOSE 351*  --  335* 254* 244* 239* 265*  BUN 60*  --  72* 49* 42* 43* 45*  CREATININE 1.78*  --  1.47* 0.89 1.02 1.04 0.96  CALCIUM 8.8*  --  8.8* 9.6 9.1 8.9 9.2  MG 2.0  --  2.2 2.0  --   --   --    < > = values in this interval not displayed.   GFR: Estimated Creatinine Clearance: 65.8 mL/min (by C-G formula based on SCr of 0.96 mg/dL). Liver Function Tests: Recent Labs  Lab 01/13/21 0324 01/14/21 0513 01/15/21 0523 01/16/21 0507 01/17/21 0730  AST 31 34 50* 27 24  ALT 34 49* 110* 79* 69*  ALKPHOS 64 70 71 66 67  BILITOT 0.6 0.9 0.9 0.8 0.9  PROT 6.4* 7.5 6.9 5.7* 5.8*  ALBUMIN 3.3* 3.8 3.3* 2.7* 2.7*   No results for input(s): LIPASE, AMYLASE in the last 168 hours. No results for input(s): AMMONIA in the last 168 hours. Coagulation Profile: No results for input(s): INR, PROTIME in the last 168 hours. Cardiac Enzymes: No results for input(s): CKTOTAL, CKMB, CKMBINDEX,  TROPONINI in the last 168 hours. BNP (last 3 results) No results for input(s):  PROBNP in the last 8760 hours. HbA1C: No results for input(s): HGBA1C in the last 72 hours. CBG: Recent Labs  Lab 01/16/21 1250 01/16/21 1557 01/16/21 2111 01/17/21 0753 01/17/21 1156  GLUCAP 285* 279* 289* 240* 457*   Lipid Profile: No results for input(s): CHOL, HDL, LDLCALC, TRIG, CHOLHDL, LDLDIRECT in the last 72 hours. Thyroid Function Tests: No results for input(s): TSH, T4TOTAL, FREET4, T3FREE, THYROIDAB in the last 72 hours. Anemia Panel: No results for input(s): VITAMINB12, FOLATE, FERRITIN, TIBC, IRON, RETICCTPCT in the last 72 hours. Sepsis Labs: Recent Labs  Lab 01/11/21 1215 01/13/21 0324 01/14/21 0513  PROCALCITON 0.24 0.16 0.10    Recent Results (from the past 240 hour(s))  Culture, blood (Routine X 2) w Reflex to ID Panel     Status: None   Collection Time: 01/11/21 12:25 PM   Specimen: BLOOD  Result Value Ref Range Status   Specimen Description BLOOD RIGHT [facility]  Final   Special Requests   Final    BOTTLES DRAWN AEROBIC AND ANAEROBIC Blood Culture adequate volume   Culture   Final    NO GROWTH 5 DAYS Performed at [facility], [address], [place], [place] [address]    Report Status 01/16/2021 FINAL  Final  Culture, blood (Routine X 2) w Reflex to ID Panel     Status: None   Collection Time: 01/11/21 12:26 PM   Specimen: BLOOD  Result Value Ref Range Status   Specimen Description BLOOD  LEFT [facility]  Final   Special Requests   Final    BOTTLES DRAWN AEROBIC AND ANAEROBIC Blood Culture adequate volume   Culture   Final    NO GROWTH 5 DAYS Performed at [facility], [address], [place], [place] [address]    Report Status 01/16/2021 FINAL  Final  Culture, sputum-assessment     Status: None   Collection Time: 01/11/21  3:02 PM   Specimen: Expectorated Sputum  Result Value Ref Range Status   Specimen Description EXPECTORATED SPUTUM  Final   Special  Requests NONE  Final   Sputum evaluation   Final    THIS SPECIMEN IS ACCEPTABLE FOR SPUTUM CULTURE Performed at [facility], [address] [place], [place] [address]    Report Status 01/11/2021 FINAL  Final  Culture, respiratory     Status: None   Collection Time: 01/11/21  3:02 PM  Result Value Ref Range Status   Specimen Description   Final    EXPECTORATED SPUTUM Performed at [facility], [address], [place], [place] [address]    Special Requests   Final    NONE Reflexed from [id number] Performed at [facility], [address] [place], [place] [address]    Gram Stain   Final    NO WBC SEEN MODERATE SQUAMOUS EPITHELIAL CELLS PRESENT ABUNDANT GRAM POSITIVE COCCI MODERATE GRAM NEGATIVE RODS RARE GRAM POSITIVE RODS    Culture   Final    MODERATE Normal respiratory flora-no Staph aureus or Pseudomonas seen Performed at [facility], [place] [address] [place], [place] [address]    Report Status 01/14/2021 FINAL  Final  C Difficile Quick Screen w PCR reflex     Status: None   Collection Time: 01/12/21  7:12 PM   Specimen: STOOL  Result Value Ref Range Status   C Diff antigen NEGATIVE NEGATIVE Final   C Diff toxin NEGATIVE NEGATIVE Final   C Diff interpretation No C. difficile detected.  Final  Comment: Performed at [facility], [address], [place], [place] [address]      Imaging Studies   No results found.   Medications   Scheduled Meds: . ALPRAZolam  0.5 mg Oral BID  . amLODipine  5 mg Oral Daily  . arformoterol  15 mcg Nebulization BID  . vitamin C  500 mg Oral Daily  . aspirin EC  81 mg Oral Daily  . cholecalciferol  1,000 Units Oral BH-q7a  . dextromethorphan-guaiFENesin  1 tablet Oral BID  . enoxaparin (LOVENOX) injection  40 mg Subcutaneous Q24H  . ezetimibe  10 mg Oral QHS  . fluticasone  2 puff Inhalation BID  . folic acid  836 mcg Oral QPM  . furosemide  40 mg Oral Daily  . insulin aspart  0-15  Units Subcutaneous TID WC  . insulin aspart  0-5 Units Subcutaneous QHS  . insulin aspart  25 Units Subcutaneous TID WC  . insulin glargine  28 Units Subcutaneous QHS  . ipratropium-albuterol  3 mL Nebulization Q6H WA  . losartan  50 mg Oral Daily  . methylPREDNISolone (SOLU-MEDROL) injection  60 mg Intravenous Q8H  . metoprolol succinate  100 mg Oral Daily  . pantoprazole  40 mg Oral BID  . polyethylene glycol  17 g Oral Daily  . sucralfate  1 g Oral TID WC & HS  . traZODone  50 mg Oral QHS  . zinc sulfate  220 mg Oral Daily   Continuous Infusions:      LOS: 6 days    Time spent: 25 minutes with > 50% spent in coordination of care or direct patient contact.    Ezekiel Slocumb, DO [facility]  01/17/2021, 4:14 PM    If 7PM-7AM, please contact night-coverage. How to contact the [facility] Attending or Consulting provider [place] or covering provider during after hours [place], for this patient?    1. Check the care team in [facility] and look for a) attending/consulting TR[facility] provider listed and b) the [facility] team listed 2. Log into [email or web address] and use [place]'s universal password to access. If you do not have the password, please contact the hospital operator. 3. Locate the [facility] provider you are looking for under [facility] and page to a number that you can be directly reached. 4. If you still have difficulty reaching the provider, please page the [facility] (Director on Call) for the Hospitalists listed on amion for assistance.

## 2021-01-17 NOTE — Progress Notes (Signed)
Physical Therapy Treatment Patient Details Name: Harold Parker MRN: [id number] DOB: [birth year] Today's Date: 01/17/2021    History of Present Illness Presented to ER secondary to SOB, fever; admitted for management of sepsis, acute respiratory failure related to COVID-19 (diagnosed 01/04/21), AECOPD.    PT Comments    Pt was long sitting in bed upon arriving. Is A and O x and agreeable to session. Was on 6 L O2 with so sao2 >90%. Ambulated ~ 200 ft without AD. Acute PT will continue to follow while in hospital however pt will not need PT after DC. Plan is to return to cardiopulmonary rehab.    Follow Up Recommendations  No PT follow up;Other (comment) (return to cardiopulmonary rehab)     Equipment Recommendations  None recommended by PT    Recommendations for Other Services       Precautions / Restrictions Precautions Precautions: Fall Restrictions Weight Bearing Restrictions: No    Mobility  Bed Mobility Overal bed mobility: Independent                Transfers Overall transfer level: Needs assistance Equipment used: None Transfers: Sit to/from Stand Sit to Stand: Supervision            Ambulation/Gait Ambulation/Gait assistance: Supervision Gait Distance (Feet): 200 Feet Assistive device: None Gait Pattern/deviations: WFL(Within Functional Limits) Gait velocity: wfl   Pt was on 6 L o2 throughout session with sao2 >90%       Balance Overall balance assessment: Needs assistance Sitting-balance support: No upper extremity supported;Feet supported Sitting balance-Leahy Scale: Good     Standing balance support: No upper extremity supported Standing balance-Leahy Scale: Good       Cognition Arousal/Alertness: Awake/alert Behavior During Therapy: WFL for tasks assessed/performed Overall Cognitive Status: Within Functional Limits for tasks assessed             Pertinent Vitals/Pain Pain Assessment: No/denies pain           PT Goals  (current goals can now be found in the care plan section) Acute Rehab PT Goals Patient Stated Goal: to return home and back to pulmonary rehab Progress towards PT goals: Progressing toward goals    Frequency    Min 2X/week      PT Plan Current plan remains appropriate       AM-PAC PT "6 Clicks" Mobility   Outcome Measure  Help needed turning from your back to your side while in a flat bed without using bedrails?: None Help needed moving from lying on your back to sitting on the side of a flat bed without using bedrails?: None Help needed moving to and from a bed to a chair (including a wheelchair)?: None Help needed standing up from a chair using your arms (e.g., wheelchair or bedside chair)?: None Help needed to walk in hospital room?: None Help needed climbing 3-5 steps with a railing? : A Little 6 Click Score: 23    End of Session Equipment Utilized During Treatment: Oxygen (4 L) Activity Tolerance: Patient tolerated treatment well Patient left: in bed;with call bell/phone within reach;with bed alarm set Nurse Communication: Mobility status PT Visit Diagnosis: Muscle weakness (generalized) (M62.81);Difficulty in walking, not elsewhere classified (R26.2)     Time: 3976-7341 PT Time Calculation (min) (ACUTE ONLY): 14 min  Charges:  $Gait Training: 8-22 mins                     Jetta Lout PTA 01/17/21, 4:46 PM

## 2021-01-18 ENCOUNTER — Ambulatory Visit: Payer: Medicare Other

## 2021-01-18 DIAGNOSIS — I5022 Chronic systolic (congestive) heart failure: Secondary | ICD-10-CM | POA: Diagnosis not present

## 2021-01-18 DIAGNOSIS — I10 Essential (primary) hypertension: Secondary | ICD-10-CM | POA: Diagnosis not present

## 2021-01-18 DIAGNOSIS — U071 COVID-19: Secondary | ICD-10-CM | POA: Diagnosis not present

## 2021-01-18 DIAGNOSIS — I2510 Atherosclerotic heart disease of native coronary artery without angina pectoris: Secondary | ICD-10-CM | POA: Diagnosis not present

## 2021-01-18 LAB — GLUCOSE, CAPILLARY
Glucose-Capillary: 234 mg/dL — ABNORMAL HIGH (ref 70–99)
Glucose-Capillary: 320 mg/dL — ABNORMAL HIGH (ref 70–99)
Glucose-Capillary: 373 mg/dL — ABNORMAL HIGH (ref 70–99)
Glucose-Capillary: 222 mg/dL — ABNORMAL HIGH (ref 70–99)

## 2021-01-18 LAB — COMPREHENSIVE METABOLIC PANEL
ALT: 107 U/L — ABNORMAL HIGH (ref 0–44)
AST: 49 U/L — ABNORMAL HIGH (ref 15–41)
Albumin: 2.5 g/dL — ABNORMAL LOW (ref 3.5–5.0)
Alkaline Phosphatase: 62 U/L (ref 38–126)
Anion gap: 7 (ref 5–15)
BUN: 46 mg/dL — ABNORMAL HIGH (ref 8–23)
CO2: 29 mmol/L (ref 22–32)
Calcium: 8.7 mg/dL — ABNORMAL LOW (ref 8.9–10.3)
Chloride: 105 mmol/L (ref 98–111)
Creatinine, Ser: 0.83 mg/dL (ref 0.61–1.24)
GFR, Estimated: >60 mL/min (ref >60)
Glucose, Bld: 209 mg/dL — ABNORMAL HIGH (ref 70–99)
Potassium: 4.4 mmol/L (ref 3.5–5.1)
Sodium: 141 mmol/L (ref 135–145)
Total Bilirubin: 0.8 mg/dL (ref 0.3–1.2)
Total Protein: 5.5 g/dL — ABNORMAL LOW (ref 6.5–8.1)

## 2021-01-18 LAB — C-REACTIVE PROTEIN: CRP: 0.8 mg/dL (ref <1.0)

## 2021-01-18 LAB — FIBRIN DERIVATIVES D-DIMER (ARMC ONLY): Fibrin derivatives D-dimer (ARMC): 916.14 ng/mL (FEU) — ABNORMAL HIGH (ref 0.00–499.00)

## 2021-01-18 MED ORDER — METHYLPREDNISOLONE SODIUM SUCC 125 MG IJ SOLR
60.0000 mg | Freq: Two times a day (BID) | INTRAMUSCULAR | Status: DC
  Administered 2021-01-19 (×2): 60 mg via INTRAVENOUS
  Filled 2021-01-18 (×3): qty 2

## 2021-01-18 MED ORDER — IPRATROPIUM-ALBUTEROL 20-100 MCG/ACT IN AERS
1.0000 | INHALATION_SPRAY | Freq: Four times a day (QID) | RESPIRATORY_TRACT | Status: DC
  Filled 2021-01-18: qty 4

## 2021-01-18 MED ORDER — IPRATROPIUM-ALBUTEROL 0.5-2.5 (3) MG/3ML IN SOLN
3.0000 mL | Freq: Four times a day (QID) | RESPIRATORY_TRACT | Status: DC
  Administered 2021-01-18 – 2021-01-24 (×20): 3 mL via RESPIRATORY_TRACT
  Filled 2021-01-18 (×19): qty 3

## 2021-01-18 NOTE — Progress Notes (Signed)
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 In the ED he was found to be hypoxemic with O2 sats 87% on room air.  Which improved with 3 L/min.  He tested positive for COVID-19.  Chest x-ray was negative for infiltrates.  Vaccination status-2 doses of Pfizer  Subjective   Patient seen and examined, continues to have shortness of breath.  This morning was requiring 6 L/min oxygen, has improved to 3 L/min.    Assessment/Plan:     1. Acute on chronic hypoxemic respiratory failure-secondary to COVID-19 pneumonia-significantly improved.  Completed remdesivir on 01/15/2021.  Will cut down Solu-Medrol to 60 mg IV every 12 hours.  Schedule Mucinex, Tussionex as needed.  Continue inhalers per orders.  CRP is down to 0.8.  Likely discharge home in a.m. 2. Hyperkalemia-treated with Lokelma, resolved. 3. Transaminitis-not present admission, likely from remdesivir.  Remdesivir has been completed. 4. Odynophagia-continue Carafate, PPI. 5. Diabetes mellitus type 2-steroid-induced hyperglycemia-CBG elevated from steroids.  We'll continue steroids to 60 mg IV every 12 hours, continue Lantus 28 units subcu daily, NovoLog 25 units 3 times daily with meals. 6. Chronic systolic CHF-EF 45 to 50%, no pulmonary edema seen on chest x-ray.  BNP 181 on admission.  Lasix and losartan resumed.  AKI resolved.  Continue metoprolol 7. hypertension-continue multipin 8. anxiety with panic attacks-continue home dose of Xanax. 9. Hyperlipidemia-continue  Zetia     COVID-19 Labs  Recent Labs    01/16/21 0507 01/17/21 0730 01/18/21 0510  CRP 3.7* 1.4* 0.8    Lab Results  Component Value Date   SARSCOV2NAA NEGATIVE 12/02/2019     Scheduled medications:   . ALPRAZolam  0.5 mg Oral BID  . amLODipine  5 mg Oral Daily  . arformoterol  15 mcg Nebulization BID  . vitamin C  500 mg Oral Daily  . aspirin EC  81 mg Oral Daily  . cholecalciferol  1,000 Units Oral BH-q7a  . dextromethorphan-guaiFENesin  1 tablet Oral BID  . enoxaparin (LOVENOX) injection  40 mg Subcutaneous Q24H  . ezetimibe  10 mg Oral QHS  . fluticasone  2 puff Inhalation BID  . folic acid  500 mcg Oral QPM  . furosemide  40 mg Oral Daily  . insulin aspart  0-15 Units Subcutaneous TID WC  . insulin aspart  0-5 Units Subcutaneous QHS  . insulin aspart  25 Units Subcutaneous TID WC  . insulin glargine  28 Units Subcutaneous QHS  . ipratropium-albuterol  3 mL Nebulization Q6H WA  . losartan  50 mg Oral Daily  . methylPREDNISolone (SOLU-MEDROL) injection  60 mg Intravenous Q8H  . metoprolol succinate  100 mg Oral Daily  . pantoprazole  40 mg Oral BID  . polyethylene glycol  17 g Oral Daily  . sucralfate  1 g Oral TID WC & HS  . traZODone  50 mg Oral QHS  . zinc sulfate  220 mg Oral Daily         CBG: Recent Labs  Lab 01/17/21 1616 01/17/21 2009 01/18/21 0818 01/18/21 1134 01/18/21 1630  GLUCAP 343* 143* 234* 320* 373*    SpO2: 93 % O2 Flow Rate (L/min): 3 L/min    CBC: Recent Labs  Lab 01/12/21 0445 01/13/21 0324 01/14/21 0513 01/15/21 0523 01/16/21 0507  WBC 7.8 13.6* 16.1* 17.1* 14.0*  NEUTROABS 6.3 12.0* 14.3* 15.5* 12.4*  HGB 14.6 14.8 16.5 15.8 14.6  HCT 43.3 42.1 48.7 46.4 41.9  MCV 90.2 88.3 89.7 88.7 88.2  PLT 164 180 241 223 203    Basic Metabolic Panel: Recent Labs  Lab 01/12/21 0445 01/12/21 1940 01/13/21 0324 01/14/21 0513 01/15/21 0523 01/16/21 0507 01/17/21 0730 01/18/21 0510  NA 133*  --  137 143 140 138  141 141  K 5.9*   < > 5.2* 5.1 4.4 4.4 4.4 4.4  CL 105  --  109 110 106 102 104 105  CO2 20*  --  21* 26 26 27 28 29   GLUCOSE 351*  --  335* 254* 244* 239* 265* 209*  BUN 60*  --  72* 49* 42* 43* 45* 46*  CREATININE 1.78*  --  1.47* 0.89 1.02 1.04 0.96 0.83  CALCIUM 8.8*  --  8.8* 9.6 9.1 8.9 9.2 8.7*  MG 2.0  --  2.2 2.0  --   --   --   --    < > = values in this interval not displayed.     Liver Function Tests: Recent Labs  Lab 01/14/21 0513 01/15/21 0523 01/16/21 0507 01/17/21 0730 01/18/21 0510  AST 34 50* 27 24 49*  ALT 49* 110* 79* 69* 107*  ALKPHOS 70 71 66 67 62  BILITOT 0.9 0.9 0.8 0.9 0.8  PROT 7.5 6.9 5.7* 5.8* 5.5*  ALBUMIN 3.8 3.3* 2.7* 2.7* 2.5*     Antibiotics: Anti-infectives (From admission, onward)   Start     Dose/Rate Route Frequency Ordered Stop   01/12/21 1000  remdesivir 100 mg in sodium chloride 0.9 % 100 mL IVPB       "Followed by" Linked Group Details   100 mg 200 mL/hr over 30 Minutes Intravenous Daily 01/11/21 0729 01/15/21 0910   01/11/21 0830  remdesivir 200 mg in sodium chloride 0.9% 250 mL IVPB       "Followed by" Linked Group Details   200 mg 580 mL/hr over 30 Minutes Intravenous Once 01/11/21 0729 01/11/21 1104       DVT prophylaxis: Lovenox  Code Status: Full code  Family Communication: No family at bedside   Consultants:    Procedures:      Objective   Vitals:   01/18/21 0836 01/18/21 1132 01/18/21 1421 01/18/21 1632  BP:  (!) 142/59  (!) 124/58  Pulse: 68 70 70 69  Resp: 20 16 16 20   Temp:  98.2 F (36.8 C)  97.8 F (36.6 C)  TempSrc:  Oral  Oral  SpO2: 93% 96% 95% 93%  Weight:      Height:        Intake/Output Summary (Last 24 hours) at 01/18/2021 1732 Last data filed at 01/17/2021 1849 Gross per 24 hour  Intake --  Output 150 ml  Net -150 ml    01/31 1901 - 02/02 0700 In: -  Out: 1550 [Urine:1550]  Filed Weights   01/14/21 0500 01/17/21 0500 01/18/21 0500  Weight: 81.3 kg 82 kg 82.5 kg     Physical Examination:   General-appears in no acute distress Heart-S1-S2, regular, no murmur auscultated Lungs-clear to auscultation bilaterally, no wheezing or crackles auscultated Abdomen-soft, nontender, no organomegaly Extremities-no  edema in the lower extremities Neuro-alert, oriented x3, no focal deficit noted  Status is: Inpatient  Dispo: The patient is from: Home              Anticipated d/c is to: Home              Anticipated d/c date is: 01/19/2021              Patient currently not stable for discharge  Barrier to discharge-COPD exacerbation         Data Reviewed:   Recent Results (from the past 240 hour(s))  Culture, blood (Routine X 2) w Reflex to ID Panel     Status: None   Collection Time: 01/11/21 12:25 PM   Specimen: BLOOD  Result Value Ref Range Status   Specimen Description BLOOD RIGHT [facility]  Final   Special Requests   Final    BOTTLES DRAWN AEROBIC AND ANAEROBIC Blood Culture adequate volume   Culture   Final    NO GROWTH 5 DAYS Performed at [facility], [address], [place], [place] [address]    Report Status 01/16/2021 FINAL  Final  Culture, blood (Routine X 2) w Reflex to ID Panel     Status: None   Collection Time: 01/11/21 12:26 PM   Specimen: BLOOD  Result Value Ref Range Status   Specimen Description BLOOD  LEFT [facility]  Final   Special Requests   Final    BOTTLES DRAWN AEROBIC AND ANAEROBIC Blood Culture adequate volume   Culture   Final    NO GROWTH 5 DAYS Performed at [facility], [address], [place], [place] [address]    Report Status 01/16/2021 FINAL  Final  Culture, sputum-assessment     Status: None   Collection Time: 01/11/21  3:02 PM   Specimen: Expectorated Sputum  Result Value Ref Range Status   Specimen Description EXPECTORATED SPUTUM  Final   Special Requests NONE  Final   Sputum evaluation   Final    THIS SPECIMEN IS ACCEPTABLE FOR SPUTUM CULTURE Performed at [facility], [address] [place], [place] [address]    Report Status 01/11/2021 FINAL  Final  Culture, respiratory     Status: None   Collection Time: 01/11/21  3:02 PM  Result Value Ref Range Status   Specimen Description   Final    EXPECTORATED SPUTUM Performed at [facility] [address], [place], [place] [address]    Special Requests   Final    NONE Reflexed from [id number] Performed at [facility], [address] [place], [place] [address]    Gram Stain   Final    NO WBC SEEN MODERATE SQUAMOUS EPITHELIAL CELLS PRESENT ABUNDANT GRAM POSITIVE COCCI MODERATE GRAM NEGATIVE RODS RARE GRAM POSITIVE RODS    Culture   Final    MODERATE Normal respiratory flora-no Staph aureus or Pseudomonas seen Performed at [facility], [address] [place], [place] [address]    Report Status 01/14/2021 FINAL  Final  C Difficile Quick Screen w PCR reflex     Status: None   Collection Time: 01/12/21  7:12 PM   Specimen: STOOL  Result Value Ref Range Status   C Diff antigen NEGATIVE NEGATIVE Final   C Diff toxin NEGATIVE NEGATIVE Final   C Diff interpretation No C. difficile detected.  Final    Comment: Performed at [facility], [address], [place], [place] [address]  No results for input(s): LIPASE, AMYLASE in the last 168 hours. No results for input(s): AMMONIA in the last 168 hours.  Cardiac Enzymes: No results for input(s): CKTOTAL, CKMB, CKMBINDEX, TROPONINI in the last 168 hours. BNP (last 3 results) Recent Labs    01/11/21 0538 01/13/21 0434  BNP 181.6* 591.1*        Tajah Noguchi S Keelin Sheridan   [facility] If 7PM-7AM, please contact night-coverage at [email or web address], Office  [phone]   01/18/2021, 5:32 PM  LOS: 7 days            Was well

## 2021-01-18 NOTE — Progress Notes (Signed)
Inpatient Diabetes Program Recommendations  AACE/ADA: New Consensus Statement on Inpatient Glycemic Control (2015)  Target Ranges:  Prepandial:   less than 140 mg/dL      Peak postprandial:   less than 180 mg/dL (1-2 hours)      Critically ill patients:  140 - 180 mg/dL   Results for Harold Parker, Harold Parker (MRN [id number]) as of 01/18/2021 12:28  Ref. Range 01/17/2021 07:53 01/17/2021 11:56 01/17/2021 16:16 01/17/2021 20:09  Glucose-Capillary Latest Ref Range: 70 - 99 mg/dL 101 (H)  23 units NOVOLOG  457 (H)  30 units NOVOLOG @1 :22pm 343 (H)  36 units NOVOLOG  143 (H)    28 units LANTUS @11 :35pm   Results for Harold Parker, Harold Parker (MRN ) as of 01/18/2021 12:28  Ref. Range 01/18/2021 08:18 01/18/2021 11:34  Glucose-Capillary Latest Ref Range: 70 - 99 mg/dL 03/18/2021 (H)  30 units NOVOLOG  320 (H)  36 units NOVOLOG    Home DM Meds: Metformin 1000 mg BID       Glipizide 5 mg daily (takes Glipizide 5 mg BID on Monday, Wednesday, Friday)   Current Orders: Lantus 28 units QHS       Novolog 0-15 units ac/hs           Novolog 25 units TID with meals   Getting Solumedrol 60 mg Q8H   MD- Please consider the following while pt getting Solumedrol:  1. Increase Lantus to 35 units QHS  2. Increase Novolog Meal Coverage to 30 units TID with meals    --Will follow patient during hospitalization--  Sunday RN, MSN, CDE Diabetes Coordinator Inpatient Glycemic Control Team Team Pager: [phone] (8a-5p)

## 2021-01-19 DIAGNOSIS — I2510 Atherosclerotic heart disease of native coronary artery without angina pectoris: Secondary | ICD-10-CM | POA: Diagnosis not present

## 2021-01-19 DIAGNOSIS — I10 Essential (primary) hypertension: Secondary | ICD-10-CM | POA: Diagnosis not present

## 2021-01-19 DIAGNOSIS — I5022 Chronic systolic (congestive) heart failure: Secondary | ICD-10-CM | POA: Diagnosis not present

## 2021-01-19 DIAGNOSIS — U071 COVID-19: Secondary | ICD-10-CM | POA: Diagnosis not present

## 2021-01-19 LAB — COMPREHENSIVE METABOLIC PANEL
ALT: 94 U/L — ABNORMAL HIGH (ref 0–44)
AST: 32 U/L (ref 15–41)
Albumin: 2.5 g/dL — ABNORMAL LOW (ref 3.5–5.0)
Alkaline Phosphatase: 60 U/L (ref 38–126)
Anion gap: 10 (ref 5–15)
BUN: 39 mg/dL — ABNORMAL HIGH (ref 8–23)
CO2: 27 mmol/L (ref 22–32)
Calcium: 8.6 mg/dL — ABNORMAL LOW (ref 8.9–10.3)
Chloride: 101 mmol/L (ref 98–111)
Creatinine, Ser: 0.88 mg/dL (ref 0.61–1.24)
GFR, Estimated: >60 mL/min (ref >60)
Glucose, Bld: 109 mg/dL — ABNORMAL HIGH (ref 70–99)
Potassium: 3.9 mmol/L (ref 3.5–5.1)
Sodium: 138 mmol/L (ref 135–145)
Total Bilirubin: 1 mg/dL (ref 0.3–1.2)
Total Protein: 5.2 g/dL — ABNORMAL LOW (ref 6.5–8.1)

## 2021-01-19 LAB — GLUCOSE, CAPILLARY
Glucose-Capillary: 145 mg/dL — ABNORMAL HIGH (ref 70–99)
Glucose-Capillary: 243 mg/dL — ABNORMAL HIGH (ref 70–99)
Glucose-Capillary: 212 mg/dL — ABNORMAL HIGH (ref 70–99)
Glucose-Capillary: 108 mg/dL — ABNORMAL HIGH (ref 70–99)

## 2021-01-19 LAB — FIBRIN DERIVATIVES D-DIMER (ARMC ONLY): Fibrin derivatives D-dimer (ARMC): 654.08 ng/mL (FEU) — ABNORMAL HIGH (ref 0.00–499.00)

## 2021-01-19 LAB — C-REACTIVE PROTEIN: CRP: 0.8 mg/dL (ref <1.0)

## 2021-01-19 MED ORDER — GUAIFENESIN ER 600 MG PO TB12
1200.0000 mg | ORAL_TABLET | Freq: Two times a day (BID) | ORAL | Status: DC
  Administered 2021-01-19 – 2021-01-24 (×11): 1200 mg via ORAL
  Filled 2021-01-19 (×10): qty 2

## 2021-01-19 MED ORDER — HYDROCOD POLST-CPM POLST ER 10-8 MG/5ML PO SUER
5.0000 mL | Freq: Two times a day (BID) | ORAL | Status: DC | PRN
  Administered 2021-01-20 – 2021-01-24 (×8): 5 mL via ORAL
  Filled 2021-01-19 (×9): qty 5

## 2021-01-19 NOTE — Progress Notes (Signed)
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 In the ED he was found to be hypoxemic with O2 sats 87% on room air.  Which improved with 3 L/min.  He tested positive for COVID-19.  Chest x-ray was negative for infiltrates.  Vaccination status-2 doses of Pfizer  Subjective   Patient seen and examined, continues to complain of shortness of breath.  Still requiring 4 to 5 L/min of oxygen via nasal cannula.    Assessment/Plan:     1. Acute on chronic hypoxemic respiratory failure-secondary to COVID-19 pneumonia-significantly improved.  Completed remdesivir on 01/15/2021.  Continue Solu-Medrol to 60 mg IV every 12 hours.  Schedule Mucinex, Tussionex as needed.  Continue inhalers per orders.  CRP is down to 0.8.   2. COPD exacerbation-patient continues to have bilateral rhonchi.  Continue DuoNeb nebulizers every 6 hours, Solu-Medrol dose has been cut down to 60 mg IV every 12 hour.  We will add Mucinex 1200 mg p.o. twice daily. 3. Hyperkalemia-treated with Lokelma, resolved. 4. Transaminitis-not present admission, likely from remdesivir.  Remdesivir dose has been completed. 5. Odynophagia-continue Carafate, PPI. 6. Diabetes mellitus type 2-steroid-induced hyperglycemia-  CBG well controlled after cutting down steroids to 60 mg IV every 12 hours, continue Lantus 28 units subcu daily, NovoLog 25 units 3 times daily with meals. 7. Chronic systolic CHF-EF 45 to 50%, no pulmonary edema seen on chest x-ray.  BNP 181 on admission.  Lasix and  losartan resumed.  AKI resolved. Continue metoprolol 8. hypertension-continue multipin 9. anxiety with panic attacks-continue home dose of Xanax. 10. Hyperlipidemia-continue Zetia     COVID-19 Labs  Recent Labs    01/17/21 0730 01/18/21 0510 01/19/21 0501  CRP 1.4* 0.8 0.8    Lab Results  Component Value Date   SARSCOV2NAA NEGATIVE 12/02/2019     Scheduled medications:   . ALPRAZolam  0.5 mg Oral BID  . amLODipine  5 mg Oral Daily  . arformoterol  15 mcg Nebulization BID  . vitamin C  500 mg Oral Daily  . aspirin EC  81 mg Oral Daily  . cholecalciferol  1,000 Units Oral BH-q7a  . dextromethorphan-guaiFENesin  1 tablet Oral BID  . enoxaparin (LOVENOX) injection  40 mg Subcutaneous Q24H  . ezetimibe  10 mg Oral QHS  . fluticasone  2 puff Inhalation BID  . folic acid  500 mcg Oral QPM  . furosemide  40 mg Oral Daily  . insulin aspart  0-15 Units Subcutaneous TID WC  . insulin aspart  0-5 Units Subcutaneous QHS  . insulin aspart  25 Units Subcutaneous TID WC  . insulin glargine  28 Units Subcutaneous QHS  . ipratropium-albuterol  3 mL Nebulization Q6H WA  . losartan  50 mg Oral Daily  . methylPREDNISolone (SOLU-MEDROL) injection  60 mg Intravenous Q12H  . metoprolol succinate  100 mg Oral Daily  . pantoprazole  40 mg Oral BID  . polyethylene glycol  17 g Oral Daily  . sucralfate  1 g Oral TID WC & HS  . traZODone  50 mg Oral  QHS  . zinc sulfate  220 mg Oral Daily         CBG: Recent Labs  Lab 01/18/21 0818 01/18/21 1134 01/18/21 1630 01/18/21 2123 01/19/21 0748  GLUCAP 234* 320* 373* 222* 145*    SpO2: 98 % O2 Flow Rate (L/min): 5 L/min    CBC: Recent Labs  Lab 01/13/21 0324 01/14/21 0513 01/15/21 0523 01/16/21 0507  WBC 13.6* 16.1* 17.1* 14.0*  NEUTROABS 12.0* 14.3* 15.5* 12.4*  HGB 14.8 16.5 15.8 14.6  HCT 42.1 48.7 46.4 41.9  MCV 88.3 89.7 88.7 88.2  PLT 180 241 223 203    Basic Metabolic Panel: Recent Labs  Lab 01/13/21 0324  01/14/21 0513 01/15/21 0523 01/16/21 0507 01/17/21 0730 01/18/21 0510 01/19/21 0501  NA 137 143 140 138 141 141 138  K 5.2* 5.1 4.4 4.4 4.4 4.4 3.9  CL 109 110 106 102 104 105 101  CO2 21* 26 26 27 28 29 27   GLUCOSE 335* 254* 244* 239* 265* 209* 109*  BUN 72* 49* 42* 43* 45* 46* 39*  CREATININE 1.47* 0.89 1.02 1.04 0.96 0.83 0.88  CALCIUM 8.8* 9.6 9.1 8.9 9.2 8.7* 8.6*  MG 2.2 2.0  --   --   --   --   --      Liver Function Tests: Recent Labs  Lab 01/15/21 0523 01/16/21 0507 01/17/21 0730 01/18/21 0510 01/19/21 0501  AST 50* 27 24 49* 32  ALT 110* 79* 69* 107* 94*  ALKPHOS 71 66 67 62 60  BILITOT 0.9 0.8 0.9 0.8 1.0  PROT 6.9 5.7* 5.8* 5.5* 5.2*  ALBUMIN 3.3* 2.7* 2.7* 2.5* 2.5*     Antibiotics: Anti-infectives (From admission, onward)   Start     Dose/Rate Route Frequency Ordered Stop   01/12/21 1000  remdesivir 100 mg in sodium chloride 0.9 % 100 mL IVPB       "Followed by" Linked Group Details   100 mg 200 mL/hr over 30 Minutes Intravenous Daily 01/11/21 0729 01/15/21 0910   01/11/21 0830  remdesivir 200 mg in sodium chloride 0.9% 250 mL IVPB       "Followed by" Linked Group Details   200 mg 580 mL/hr over 30 Minutes Intravenous Once 01/11/21 0729 01/11/21 1104       DVT prophylaxis: Lovenox  Code Status: Full code  Family Communication: No family at bedside   Consultants:    Procedures:      Objective   Vitals:   01/19/21 0009 01/19/21 0341 01/19/21 0436 01/19/21 0749  BP: (!) 146/60 117/83  133/70  Pulse: 76 84  84  Resp: 20 18  17   Temp: 97.9 F (36.6 C) 97.7 F (36.5 C)  98.5 F (36.9 C)  TempSrc: Oral Oral  Oral  SpO2: 92% 94%  98%  Weight:   82.8 kg   Height:       No intake or output data in the 24 hours ending 01/19/21 1150  No intake/output data recorded.  Filed Weights   01/17/21 0500 01/18/21 0500 01/19/21 0436  Weight: 82 kg 82.5 kg 82.8 kg    Physical Examination:   General-appears in no acute  distress Heart-S1-S2, regular, no murmur auscultated Lungs-bilateral rhonchi auscultated Abdomen-soft, nontender, no organomegaly Extremities-no edema in the lower extremities Neuro-alert, oriented x3, no focal deficit noted  Status is: Inpatient  Dispo: The patient is from: Home              Anticipated d/c is to: Home  Anticipated d/c date is: 01/21/2021              Patient currently not stable for discharge  Barrier to discharge-COPD exacerbation         Data Reviewed:   Recent Results (from the past 240 hour(s))  Culture, blood (Routine X 2) w Reflex to ID Panel     Status: None   Collection Time: 01/11/21 12:25 PM   Specimen: BLOOD  Result Value Ref Range Status   Specimen Description BLOOD RIGHT [facility]  Final   Special Requests   Final    BOTTLES DRAWN AEROBIC AND ANAEROBIC Blood Culture adequate volume   Culture   Final    NO GROWTH 5 DAYS Performed at [facility], [address] [place], [place] [address]    Report Status 01/16/2021 FINAL  Final  Culture, blood (Routine X 2) w Reflex to ID Panel     Status: None   Collection Time: 01/11/21 12:26 PM   Specimen: BLOOD  Result Value Ref Range Status   Specimen Description BLOOD  LEFT [facility]  Final   Special Requests   Final    BOTTLES DRAWN AEROBIC AND ANAEROBIC Blood Culture adequate volume   Culture   Final    NO GROWTH 5 DAYS Performed at [facility], [address] [place], [place] [address]    Report Status 01/16/2021 FINAL  Final  Culture, sputum-assessment     Status: None   Collection Time: 01/11/21  3:02 PM   Specimen: Expectorated Sputum  Result Value Ref Range Status   Specimen Description EXPECTORATED SPUTUM  Final   Special Requests NONE  Final   Sputum evaluation   Final    THIS SPECIMEN IS ACCEPTABLE FOR SPUTUM CULTURE Performed at [facility], [address], [place], [place] [address]    Report Status 01/11/2021 FINAL  Final  Culture, respiratory      Status: None   Collection Time: 01/11/21  3:02 PM  Result Value Ref Range Status   Specimen Description   Final    EXPECTORATED SPUTUM Performed at [facility], [address] [place], [place] [address]    Special Requests   Final    NONE Reflexed from [id number] Performed at [facility], [address] [place], [place] [address]    Gram Stain   Final    NO WBC SEEN MODERATE SQUAMOUS EPITHELIAL CELLS PRESENT ABUNDANT GRAM POSITIVE COCCI MODERATE GRAM NEGATIVE RODS RARE GRAM POSITIVE RODS    Culture   Final    MODERATE Normal respiratory flora-no Staph aureus or Pseudomonas seen Performed at [facility], [address], [place], [place] [address]    Report Status 01/14/2021 FINAL  Final  C Difficile Quick Screen w PCR reflex     Status: None   Collection Time: 01/12/21  7:12 PM   Specimen: STOOL  Result Value Ref Range Status   C Diff antigen NEGATIVE NEGATIVE Final   C Diff toxin NEGATIVE NEGATIVE Final   C Diff interpretation No C. difficile detected.  Final    Comment: Performed at [facility], [address] [place], [place] [address]    No results for input(s): LIPASE, AMYLASE in the last 168 hours. No results for input(s): AMMONIA in the last 168 hours.  Cardiac Enzymes: No results for input(s): CKTOTAL, CKMB, CKMBINDEX, TROPONINI in the last 168 hours. BNP (last 3 results) Recent Labs    01/11/21 0538 01/13/21 0434  BNP 181.6*  591.1*        Meredeth Ide   [facility] If 7PM-7AM, please contact night-coverage at [email or web address], Office  [phone]   01/19/2021, 11:50 AM  LOS: 8 days            Was well

## 2021-01-19 NOTE — Progress Notes (Signed)
Harold Parker Called and updated on pt status

## 2021-01-19 NOTE — Progress Notes (Signed)
Physical Therapy Treatment Patient Details Name: Harold Parker MRN: [id number] DOB: October 03, 1946 Today's Date: 01/19/2021    History of Present Illness Presented to ER secondary to SOB, fever; admitted for management of sepsis, acute respiratory failure related to COVID-19 (diagnosed 01/04/21), AECOPD.    PT Comments    Pt was sitting EOB upon arriving. On 6L upon arriving however weaned to 4 L throughout session. He was able to maintain >90% on 4 L throughout. Pt easily stood and ambulated 400 ft on 4 L. At conclusion of session, pt was in recliner with call bell in reach and RN tech aware of wean to 4L. Pt was in recliner with breakfast tray in front of him + call bell in reach at conclusion of session.    Follow Up Recommendations  No PT follow up;Other (comment) (return to cardiopulmonary rehab)     Equipment Recommendations  None recommended by PT    Recommendations for Other Services       Precautions / Restrictions Precautions Precautions: Fall Restrictions Weight Bearing Restrictions: No    Mobility  Bed Mobility Overal bed mobility: Independent       Transfers Overall transfer level: Modified independent Equipment used: None     Ambulation/Gait Ambulation/Gait assistance: Supervision Gait Distance (Feet): 400 Feet Assistive device: None Gait Pattern/deviations: WFL(Within Functional Limits) Gait velocity: WNL   General Gait Details: Pt ambulate 400 ft without AD on 4 L o2 with sao2 >90%       Balance Overall balance assessment: Needs assistance Sitting-balance support: No upper extremity supported;Feet supported Sitting balance-Leahy Scale: Good     Standing balance support: No upper extremity supported Standing balance-Leahy Scale: Good        Cognition Arousal/Alertness: Awake/alert Behavior During Therapy: WFL for tasks assessed/performed Overall Cognitive Status: Within Functional Limits for tasks assessed        General Comments: Pt is A  and O x 4             Pertinent Vitals/Pain Pain Assessment: No/denies pain           PT Goals (current goals can now be found in the care plan section) Acute Rehab PT Goals Patient Stated Goal: to return home and back to pulmonary rehab Progress towards PT goals: Progressing toward goals    Frequency    Min 2X/week      PT Plan Current plan remains appropriate       AM-PAC PT "6 Clicks" Mobility   Outcome Measure  Help needed turning from your back to your side while in a flat bed without using bedrails?: None Help needed moving from lying on your back to sitting on the side of a flat bed without using bedrails?: None Help needed moving to and from a bed to a chair (including a wheelchair)?: None Help needed standing up from a chair using your arms (e.g., wheelchair or bedside chair)?: None Help needed to walk in hospital room?: None Help needed climbing 3-5 steps with a railing? : A Little 6 Click Score: 23    End of Session Equipment Utilized During Treatment: Oxygen (4 L o2) Activity Tolerance: Patient tolerated treatment well Patient left: in chair;with call bell/phone within reach Nurse Communication: Mobility status PT Visit Diagnosis: Muscle weakness (generalized) (M62.81);Difficulty in walking, not elsewhere classified (R26.2)     Time: 2202-5427 PT Time Calculation (min) (ACUTE ONLY): 13 min  Charges:  $Gait Training: 8-22 mins  Jetta Lout PTA 01/19/21, 9:13 AM

## 2021-01-20 DIAGNOSIS — U071 COVID-19: Secondary | ICD-10-CM | POA: Diagnosis not present

## 2021-01-20 DIAGNOSIS — I5022 Chronic systolic (congestive) heart failure: Secondary | ICD-10-CM | POA: Diagnosis not present

## 2021-01-20 DIAGNOSIS — I10 Essential (primary) hypertension: Secondary | ICD-10-CM | POA: Diagnosis not present

## 2021-01-20 DIAGNOSIS — I2510 Atherosclerotic heart disease of native coronary artery without angina pectoris: Secondary | ICD-10-CM | POA: Diagnosis not present

## 2021-01-20 LAB — GLUCOSE, CAPILLARY
Glucose-Capillary: 244 mg/dL — ABNORMAL HIGH (ref 70–99)
Glucose-Capillary: 279 mg/dL — ABNORMAL HIGH (ref 70–99)
Glucose-Capillary: 285 mg/dL — ABNORMAL HIGH (ref 70–99)
Glucose-Capillary: 216 mg/dL — ABNORMAL HIGH (ref 70–99)

## 2021-01-20 LAB — CBC
HCT: 40.9 % (ref 39.0–52.0)
Hemoglobin: 14.2 g/dL (ref 13.0–17.0)
MCH: 30.6 pg (ref 26.0–34.0)
MCHC: 34.7 g/dL (ref 30.0–36.0)
MCV: 88.1 fL (ref 80.0–100.0)
Platelets: 243 10*3/uL (ref 150–400)
RBC: 4.64 MIL/uL (ref 4.22–5.81)
RDW: 13.5 % (ref 11.5–15.5)
WBC: 22.1 10*3/uL — ABNORMAL HIGH (ref 4.0–10.5)
nRBC: 0 % (ref 0.0–0.2)

## 2021-01-20 LAB — COMPREHENSIVE METABOLIC PANEL
ALT: 104 U/L — ABNORMAL HIGH (ref 0–44)
AST: 32 U/L (ref 15–41)
Albumin: 2.3 g/dL — ABNORMAL LOW (ref 3.5–5.0)
Alkaline Phosphatase: 65 U/L (ref 38–126)
Anion gap: 5 (ref 5–15)
BUN: 39 mg/dL — ABNORMAL HIGH (ref 8–23)
CO2: 29 mmol/L (ref 22–32)
Calcium: 8.7 mg/dL — ABNORMAL LOW (ref 8.9–10.3)
Chloride: 100 mmol/L (ref 98–111)
Creatinine, Ser: 0.94 mg/dL (ref 0.61–1.24)
GFR, Estimated: >60 mL/min (ref >60)
Glucose, Bld: 244 mg/dL — ABNORMAL HIGH (ref 70–99)
Potassium: 4 mmol/L (ref 3.5–5.1)
Sodium: 134 mmol/L — ABNORMAL LOW (ref 135–145)
Total Bilirubin: 1 mg/dL (ref 0.3–1.2)
Total Protein: 5.6 g/dL — ABNORMAL LOW (ref 6.5–8.1)

## 2021-01-20 LAB — C-REACTIVE PROTEIN: CRP: 10.1 mg/dL — ABNORMAL HIGH (ref <1.0)

## 2021-01-20 LAB — FIBRIN DERIVATIVES D-DIMER (ARMC ONLY): Fibrin derivatives D-dimer (ARMC): 448.98 ng/mL (FEU) (ref 0.00–499.00)

## 2021-01-20 MED ORDER — DOXYCYCLINE HYCLATE 100 MG PO TABS
100.0000 mg | ORAL_TABLET | Freq: Two times a day (BID) | ORAL | Status: DC
  Administered 2021-01-20 – 2021-01-24 (×8): 100 mg via ORAL
  Filled 2021-01-20 (×8): qty 1

## 2021-01-20 MED ORDER — METHYLPREDNISOLONE SODIUM SUCC 125 MG IJ SOLR
60.0000 mg | Freq: Two times a day (BID) | INTRAMUSCULAR | Status: DC
  Administered 2021-01-20 (×2): 60 mg via INTRAVENOUS
  Filled 2021-01-20 (×2): qty 2

## 2021-01-20 NOTE — Progress Notes (Addendum)
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 In the ED he was found to be hypoxemic with O2 sats 87% on room air.  Which improved with 3 L/min.  He tested positive for COVID-19.  Chest x-ray was negative for infiltrates.  Vaccination status-2 doses of Pfizer  Subjective   Patient seen and examined: Continues to complain of shortness of breath.  Requiring 6 L/min of oxygen via nasal cannula.    Assessment/Plan:     1. Acute on chronic hypoxemic respiratory failure-secondary to COVID-19 pneumonia-continues to be short of breath with high oxygen requirement.  Completed remdesivir on 01/15/2021.  Continue Solu-Medrol to 60 mg IV every 12 hours.  Scheduled Mucinex, Tussionex as needed.  Continue inhalers per orders.  CRP is up to 10.1.  Will repeat chest x-ray in a.m. 2. COPD exacerbation-patient continues to have bilateral rhonchi.  Continue DuoNeb nebulizers every 6 hours, Solu-Medrol dose has been cut down to 60 mg IV every 12 hour.  Continue Mucinex 1200 mg p.o. twice daily.  We will add doxycycline 100 mg p.o. twice daily 3. Hyperkalemia-treated with Lokelma, resolved. 4. Transaminitis-not present admission, likely from remdesivir.  Remdesivir treatment has been completed. 5. Odynophagia-continue Carafate, PPI. 6. Diabetes mellitus type 2-steroid-induced hyperglycemia-  CBG well controlled after cutting down steroids to 60 mg IV every 12 hours, continue Lantus 28 units subcu daily, NovoLog 25 units 3 times daily with  meals. 7. Chronic systolic CHF-EF 45 to 50%, no pulmonary edema seen on chest x-ray.  BNP 181 on admission.  Lasix and losartan resumed.  AKI resolved. Continue metoprolol 8. Leukocytosis-WBC is elevated to 22,000, likely from steroids. 9. hypertension-continue multipin 10. anxiety with panic attacks-continue home dose of Xanax. 11. Hyperlipidemia-continue Zetia     COVID-19 Labs  Recent Labs    01/18/21 0510 01/19/21 0501 01/20/21 0446  CRP 0.8 0.8 10.1*    Lab Results  Component Value Date   SARSCOV2NAA NEGATIVE 12/02/2019     Scheduled medications:   . ALPRAZolam  0.5 mg Oral BID  . amLODipine  5 mg Oral Daily  . arformoterol  15 mcg Nebulization BID  . vitamin C  500 mg Oral Daily  . aspirin EC  81 mg Oral Daily  . cholecalciferol  1,000 Units Oral BH-q7a  . enoxaparin (LOVENOX) injection  40 mg Subcutaneous Q24H  . ezetimibe  10 mg Oral QHS  . fluticasone  2 puff Inhalation BID  . folic acid  500 mcg Oral QPM  . furosemide  40 mg Oral Daily  . guaiFENesin  1,200 mg Oral BID  . insulin aspart  0-15 Units Subcutaneous TID WC  . insulin aspart  0-5 Units Subcutaneous QHS  . insulin aspart  25 Units Subcutaneous TID WC  . insulin glargine  28 Units Subcutaneous QHS  . ipratropium-albuterol  3 mL Nebulization Q6H WA  . losartan  50 mg Oral Daily  . methylPREDNISolone (SOLU-MEDROL) injection  60 mg Intravenous Q12H  . metoprolol succinate  100 mg Oral Daily  . pantoprazole  40 mg Oral BID  .  polyethylene glycol  17 g Oral Daily  . sucralfate  1 g Oral TID WC & HS  . traZODone  50 mg Oral QHS  . zinc sulfate  220 mg Oral Daily         CBG: Recent Labs  Lab 01/19/21 1208 01/19/21 1639 01/19/21 2130 01/20/21 0752 01/20/21 1202  GLUCAP 243* 212* 108* 244* 279*    SpO2: 95 % O2 Flow Rate (L/min): 6 L/min    CBC: Recent Labs  Lab 01/14/21 0513 01/15/21 0523 01/16/21 0507 01/20/21 0446  WBC 16.1* 17.1* 14.0* 22.1*  NEUTROABS 14.3* 15.5* 12.4*   --   HGB 16.5 15.8 14.6 14.2  HCT 48.7 46.4 41.9 40.9  MCV 89.7 88.7 88.2 88.1  PLT 241 223 203 243    Basic Metabolic Panel: Recent Labs  Lab 01/14/21 0513 01/15/21 0523 01/16/21 0507 01/17/21 0730 01/18/21 0510 01/19/21 0501 01/20/21 0446  NA 143   < > 138 141 141 138 134*  K 5.1   < > 4.4 4.4 4.4 3.9 4.0  CL 110   < > 102 104 105 101 100  CO2 26   < > 27 28 29 27 29   GLUCOSE 254*   < > 239* 265* 209* 109* 244*  BUN 49*   < > 43* 45* 46* 39* 39*  CREATININE 0.89   < > 1.04 0.96 0.83 0.88 0.94  CALCIUM 9.6   < > 8.9 9.2 8.7* 8.6* 8.7*  MG 2.0  --   --   --   --   --   --    < > = values in this interval not displayed.     Liver Function Tests: Recent Labs  Lab 01/16/21 0507 01/17/21 0730 01/18/21 0510 01/19/21 0501 01/20/21 0446  AST 27 24 49* 32 32  ALT 79* 69* 107* 94* 104*  ALKPHOS 66 67 62 60 65  BILITOT 0.8 0.9 0.8 1.0 1.0  PROT 5.7* 5.8* 5.5* 5.2* 5.6*  ALBUMIN 2.7* 2.7* 2.5* 2.5* 2.3*     Antibiotics: Anti-infectives (From admission, onward)   Start     Dose/Rate Route Frequency Ordered Stop   01/12/21 1000  remdesivir 100 mg in sodium chloride 0.9 % 100 mL IVPB       "Followed by" Linked Group Details   100 mg 200 mL/hr over 30 Minutes Intravenous Daily 01/11/21 0729 01/15/21 0910   01/11/21 0830  remdesivir 200 mg in sodium chloride 0.9% 250 mL IVPB       "Followed by" Linked Group Details   200 mg 580 mL/hr over 30 Minutes Intravenous Once 01/11/21 0729 01/11/21 1104       DVT prophylaxis: Lovenox  Code Status: Full code  Family Communication: No family at bedside   Consultants:    Procedures:      Objective   Vitals:   01/20/21 0026 01/20/21 0426 01/20/21 0500 01/20/21 0755  BP: 104/81 (!) 148/64  134/63  Pulse: 89 87  79  Resp: 18 18  18   Temp: 97.6 F (36.4 C) 97.6 F (36.4 C)  (!) 96.3 F (35.7 C)  TempSrc:      SpO2: 92% 91%  95%  Weight:   82.9 kg   Height:        Intake/Output Summary (Last 24 hours) at  01/20/2021 1530 Last data filed at 01/20/2021 1127 Gross per 24 hour  Intake --  Output 700 ml  Net -700 ml    02/02 1901 - 02/04 0700 In: -  Out: 100 [Urine:100]  Filed Weights   01/18/21 0500 01/19/21 0436 01/20/21 0500  Weight: 82.5 kg 82.8 kg 82.9 kg    Physical Examination:  General-appears in no acute distress Heart-S1-S2, regular, no murmur auscultated Lungs-bilateral rhonchi auscultated Abdomen-soft, nontender, no organomegaly Extremities-no edema in the lower extremities Neuro-alert, oriented x3, no focal deficit noted  Status is: Inpatient  Dispo: The patient is from: Home              Anticipated d/c is to: Home              Anticipated d/c date is: 01/21/2021              Patient currently not stable for discharge  Barrier to discharge-COPD exacerbation      Data Reviewed:   Recent Results (from the past 240 hour(s))  Culture, blood (Routine X 2) w Reflex to ID Panel     Status: None   Collection Time: 01/11/21 12:25 PM   Specimen: BLOOD  Result Value Ref Range Status   Specimen Description BLOOD RIGHT [facility]  Final   Special Requests   Final    BOTTLES DRAWN AEROBIC AND ANAEROBIC Blood Culture adequate volume   Culture   Final    NO GROWTH 5 DAYS Performed at [facility], [address] [place], [place] [address]    Report Status 01/16/2021 FINAL  Final  Culture, blood (Routine X 2) w Reflex to ID Panel     Status: None   Collection Time: 01/11/21 12:26 PM   Specimen: BLOOD  Result Value Ref Range Status   Specimen Description BLOOD  LEFT [facility]  Final   Special Requests   Final    BOTTLES DRAWN AEROBIC AND ANAEROBIC Blood Culture adequate volume   Culture   Final    NO GROWTH 5 DAYS Performed at [facility], [address], [place], [place] [address]    Report Status 01/16/2021 FINAL  Final  Culture, sputum-assessment     Status: None   Collection Time: 01/11/21  3:02 PM   Specimen: Expectorated Sputum  Result Value Ref Range  Status   Specimen Description EXPECTORATED SPUTUM  Final   Special Requests NONE  Final   Sputum evaluation   Final    THIS SPECIMEN IS ACCEPTABLE FOR SPUTUM CULTURE Performed at [facility], [address], [place], [place] [address]    Report Status 01/11/2021 FINAL  Final  Culture, respiratory     Status: None   Collection Time: 01/11/21  3:02 PM  Result Value Ref Range Status   Specimen Description   Final    EXPECTORATED SPUTUM Performed at [facility], [address], [place], [place] [address]    Special Requests   Final    NONE Reflexed from [id number] Performed at [facility], [address] [place], [place] [address]    Gram Stain   Final    NO WBC SEEN MODERATE SQUAMOUS EPITHELIAL CELLS PRESENT ABUNDANT GRAM POSITIVE COCCI MODERATE GRAM NEGATIVE RODS RARE GRAM POSITIVE RODS    Culture   Final    MODERATE Normal respiratory flora-no Staph aureus or Pseudomonas seen Performed at [facility], [address] [place], [place] [address]    Report Status 01/14/2021 FINAL  Final  C Difficile Quick Screen w PCR reflex     Status: None   Collection Time: 01/12/21  7:12 PM   Specimen: STOOL  Result Value Ref Range Status  C Diff antigen NEGATIVE NEGATIVE Final   C Diff toxin NEGATIVE NEGATIVE Final   C Diff interpretation No C. difficile detected.  Final    Comment: Performed at [facility], [address] [place], [place] [address]    No results for input(s): LIPASE, AMYLASE in the last 168 hours. No results for input(s): AMMONIA in the last 168 hours.  Cardiac Enzymes: No results for input(s): CKTOTAL, CKMB, CKMBINDEX, TROPONINI in the last 168 hours. BNP (last 3 results) Recent Labs    01/11/21 0538 01/13/21 0434  BNP 181.6* 591.1*        Evelina Lore S Ervey Fallin   [facility] If 7PM-7AM, please contact night-coverage at [email or web address], Office  [phone]   01/20/2021, 3:30 PM  LOS: 9 days             Was well

## 2021-01-21 ENCOUNTER — Inpatient Hospital Stay: Payer: Medicare Other

## 2021-01-21 DIAGNOSIS — K219 Gastro-esophageal reflux disease without esophagitis: Secondary | ICD-10-CM | POA: Diagnosis not present

## 2021-01-21 DIAGNOSIS — J9601 Acute respiratory failure with hypoxia: Secondary | ICD-10-CM | POA: Diagnosis not present

## 2021-01-21 DIAGNOSIS — U071 COVID-19: Secondary | ICD-10-CM | POA: Diagnosis not present

## 2021-01-21 DIAGNOSIS — J449 Chronic obstructive pulmonary disease, unspecified: Secondary | ICD-10-CM | POA: Diagnosis not present

## 2021-01-21 LAB — COMPREHENSIVE METABOLIC PANEL
ALT: 98 U/L — ABNORMAL HIGH (ref 0–44)
AST: 28 U/L (ref 15–41)
Albumin: 2.4 g/dL — ABNORMAL LOW (ref 3.5–5.0)
Alkaline Phosphatase: 87 U/L (ref 38–126)
Anion gap: 8 (ref 5–15)
BUN: 46 mg/dL — ABNORMAL HIGH (ref 8–23)
CO2: 28 mmol/L (ref 22–32)
Calcium: 9.1 mg/dL (ref 8.9–10.3)
Chloride: 100 mmol/L (ref 98–111)
Creatinine, Ser: 0.98 mg/dL (ref 0.61–1.24)
GFR, Estimated: >60 mL/min (ref >60)
Glucose, Bld: 417 mg/dL — ABNORMAL HIGH (ref 70–99)
Potassium: 4.3 mmol/L (ref 3.5–5.1)
Sodium: 136 mmol/L (ref 135–145)
Total Bilirubin: 0.9 mg/dL (ref 0.3–1.2)
Total Protein: 5.9 g/dL — ABNORMAL LOW (ref 6.5–8.1)

## 2021-01-21 LAB — GLUCOSE, CAPILLARY
Glucose-Capillary: 394 mg/dL — ABNORMAL HIGH (ref 70–99)
Glucose-Capillary: 270 mg/dL — ABNORMAL HIGH (ref 70–99)
Glucose-Capillary: 182 mg/dL — ABNORMAL HIGH (ref 70–99)
Glucose-Capillary: 160 mg/dL — ABNORMAL HIGH (ref 70–99)

## 2021-01-21 LAB — FIBRIN DERIVATIVES D-DIMER (ARMC ONLY): Fibrin derivatives D-dimer (ARMC): 520.64 ng/mL (FEU) — ABNORMAL HIGH (ref 0.00–499.00)

## 2021-01-21 LAB — C-REACTIVE PROTEIN: CRP: 5.4 mg/dL — ABNORMAL HIGH (ref <1.0)

## 2021-01-21 MED ORDER — METHYLPREDNISOLONE SODIUM SUCC 40 MG IJ SOLR
40.0000 mg | Freq: Two times a day (BID) | INTRAMUSCULAR | Status: DC
  Administered 2021-01-21 – 2021-01-22 (×3): 40 mg via INTRAVENOUS
  Filled 2021-01-21 (×3): qty 1

## 2021-01-21 NOTE — Progress Notes (Addendum)
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 In the ED he was found to be hypoxemic with O2 sats 87% on room air.  Which improved with 3 L/min.  He tested positive for COVID-19.  Chest x-ray was negative for infiltrates.  Vaccination status-2 doses of Pfizer  Subjective   Patient seen and examined, he is feeling better today.  Though not back to baseline.    Assessment/Plan:     1. Acute on chronic hypoxemic respiratory failure-secondary to COVID-19 pneumonia-continues to be short of breath with high oxygen requirement.  Completed remdesivir on 01/15/2021.  Continue Solu-Medrol to 60 mg IV every 12 hours.  Scheduled Mucinex, Tussionex as needed.  Continue inhalers per orders.  CRP is down to 5.4 this morning. 2. COPD exacerbation-patient continues to have bilateral rhonchi.  Continue DuoNeb nebulizers every 6 hours, Solu-Medrol dose has been cut down to 60 mg IV every 12 hour.  Continue Mucinex 1200 mg p.o. twice daily.  Patient started on  doxycycline 100 mg p.o. twice daily 3. Hyperkalemia-treated with Lokelma, resolved. 4. Transaminitis-not present admission, likely from remdesivir.  Remdesivir treatment has been completed. 5. Odynophagia-continue Carafate, PPI. 6. Diabetes mellitus type 2-steroid-induced hyperglycemia-  CBG is elevated, will control steroids to 40 mg IV every 12 hours. continue Lantus 28 units subcu daily, NovoLog 25 units 3 times daily with meals. 7. Chronic systolic CHF-EF 45 to 50%, no pulmonary edema seen  on chest x-ray.  BNP 181 on admission.  Lasix and losartan resumed.  AKI resolved. Continue metoprolol.  Continue to follow BMP. 8. Leukocytosis-WBC is elevated to 22,000, likely from steroids. 9. hypertension-continue multipin 10. anxiety with panic attacks-continue home dose of Xanax. 11. Hyperlipidemia-continue Zetia     COVID-19 Labs  Recent Labs    01/19/21 0501 01/20/21 0446 01/21/21 0722  CRP 0.8 10.1* 5.4*    Lab Results  Component Value Date   SARSCOV2NAA NEGATIVE 12/02/2019     Scheduled medications:   . ALPRAZolam  0.5 mg Oral BID  . amLODipine  5 mg Oral Daily  . arformoterol  15 mcg Nebulization BID  . vitamin C  500 mg Oral Daily  . aspirin EC  81 mg Oral Daily  . cholecalciferol  1,000 Units Oral BH-q7a  . doxycycline  100 mg Oral Q12H  . enoxaparin (LOVENOX) injection  40 mg Subcutaneous Q24H  . ezetimibe  10 mg Oral QHS  . fluticasone  2 puff Inhalation BID  . folic acid  500 mcg Oral QPM  . furosemide  40 mg Oral Daily  . guaiFENesin  1,200 mg Oral BID  . insulin aspart  0-15 Units Subcutaneous TID WC  . insulin aspart  0-5 Units Subcutaneous QHS  . insulin aspart  25 Units Subcutaneous TID WC  . insulin glargine  28 Units Subcutaneous QHS  . ipratropium-albuterol  3 mL Nebulization Q6H WA  . losartan  50 mg Oral Daily  . methylPREDNISolone (SOLU-MEDROL) injection  40 mg Intravenous BID  . metoprolol succinate  100 mg Oral Daily  . pantoprazole  40  mg Oral BID  . polyethylene glycol  17 g Oral Daily  . sucralfate  1 g Oral TID WC & HS  . traZODone  50 mg Oral QHS  . zinc sulfate  220 mg Oral Daily         CBG: Recent Labs  Lab 01/20/21 1621 01/20/21 2109 01/21/21 0745 01/21/21 1131 01/21/21 1527  GLUCAP 285* 216* 394* 270* 182*    SpO2: 94 % O2 Flow Rate (L/min): 5 L/min    CBC: Recent Labs  Lab 01/15/21 0523 01/16/21 0507 01/20/21 0446  WBC 17.1* 14.0* 22.1*  NEUTROABS 15.5* 12.4*  --   HGB 15.8 14.6 14.2  HCT 46.4  41.9 40.9  MCV 88.7 88.2 88.1  PLT 223 203 243    Basic Metabolic Panel: Recent Labs  Lab 01/17/21 0730 01/18/21 0510 01/19/21 0501 01/20/21 0446 01/21/21 0722  NA 141 141 138 134* 136  K 4.4 4.4 3.9 4.0 4.3  CL 104 105 101 100 100  CO2 28 29 27 29 28   GLUCOSE 265* 209* 109* 244* 417*  BUN 45* 46* 39* 39* 46*  CREATININE 0.96 0.83 0.88 0.94 0.98  CALCIUM 9.2 8.7* 8.6* 8.7* 9.1     Liver Function Tests: Recent Labs  Lab 01/17/21 0730 01/18/21 0510 01/19/21 0501 01/20/21 0446 01/21/21 0722  AST 24 49* 32 32 28  ALT 69* 107* 94* 104* 98*  ALKPHOS 67 62 60 65 87  BILITOT 0.9 0.8 1.0 1.0 0.9  PROT 5.8* 5.5* 5.2* 5.6* 5.9*  ALBUMIN 2.7* 2.5* 2.5* 2.3* 2.4*     Antibiotics: Anti-infectives (From admission, onward)   Start     Dose/Rate Route Frequency Ordered Stop   01/20/21 2200  doxycycline (VIBRA-TABS) tablet 100 mg        100 mg Oral Every 12 hours 01/20/21 1533     01/12/21 1000  remdesivir 100 mg in sodium chloride 0.9 % 100 mL IVPB       "Followed by" Linked Group Details   100 mg 200 mL/hr over 30 Minutes Intravenous Daily 01/11/21 0729 01/15/21 0910   01/11/21 0830  remdesivir 200 mg in sodium chloride 0.9% 250 mL IVPB       "Followed by" Linked Group Details   200 mg 580 mL/hr over 30 Minutes Intravenous Once 01/11/21 0729 01/11/21 1104       DVT prophylaxis: Lovenox  Code Status: Full code  Family Communication: No family at bedside   Consultants:    Procedures:      Objective   Vitals:   01/21/21 0349 01/21/21 0736 01/21/21 1134 01/21/21 1529  BP: (!) 123/59 129/60 122/67 (!) 133/59  Pulse: 77 75 81 75  Resp: 18 18 18 18   Temp: 97.7 F (36.5 C) 97.8 F (36.6 C) 97.9 F (36.6 C) 98.4 F (36.9 C)  TempSrc: Oral Oral    SpO2: 92% 96% 96% 94%  Weight:      Height:        Intake/Output Summary (Last 24 hours) at 01/21/2021 1629 Last data filed at 01/21/2021 1300 Gross per 24 hour  Intake --  Output 1720 ml  Net -1720 ml     02/03 1901 - 02/05 0700 In: -  Out: 1900 [Urine:1900]  Filed Weights   01/19/21 0436 01/20/21 0500 01/21/21 0200  Weight: 82.8 kg 82.9 kg 82.9 kg    Physical Examination:  General-appears in no acute distress Heart-S1-S2, regular, no murmur auscultated Lungs-bilateral rhonchi auscultated Abdomen-soft, nontender, no organomegaly Extremities-no edema in  the lower extremities Neuro-alert, oriented x3, no focal deficit noted  Status is: Inpatient  Dispo: The patient is from: Home              Anticipated d/c is to: Home              Anticipated d/c date is: 01/23/2021              Patient currently not stable for discharge  Barrier to discharge-COPD exacerbation      Data Reviewed:   Recent Results (from the past 240 hour(Parker))  C Difficile Quick Screen w PCR reflex     Status: None   Collection Time: 01/12/21  7:12 PM   Specimen: STOOL  Result Value Ref Range Status   C Diff antigen NEGATIVE NEGATIVE Final   C Diff toxin NEGATIVE NEGATIVE Final   C Diff interpretation No C. difficile detected.  Final    Comment: Performed at [facility], [address] [place], [place] [address]    No results for input(Parker): LIPASE, AMYLASE in the last 168 hours. No results for input(Parker): AMMONIA in the last 168 hours.  Cardiac Enzymes: No results for input(Parker): CKTOTAL, CKMB, CKMBINDEX, TROPONINI in the last 168 hours. BNP (last 3 results) Recent Labs    01/11/21 0538 01/13/21 0434  BNP 181.6* 591.1*        Harold Parker Harold Parker   [facility] If 7PM-7AM, please contact night-coverage at [email or web address], Office  [phone]   01/21/2021, 4:29 PM  LOS: 10 days            Was well

## 2021-01-22 DIAGNOSIS — K219 Gastro-esophageal reflux disease without esophagitis: Secondary | ICD-10-CM | POA: Diagnosis not present

## 2021-01-22 DIAGNOSIS — U071 COVID-19: Secondary | ICD-10-CM | POA: Diagnosis not present

## 2021-01-22 DIAGNOSIS — J449 Chronic obstructive pulmonary disease, unspecified: Secondary | ICD-10-CM | POA: Diagnosis not present

## 2021-01-22 DIAGNOSIS — J9601 Acute respiratory failure with hypoxia: Secondary | ICD-10-CM | POA: Diagnosis not present

## 2021-01-22 LAB — BASIC METABOLIC PANEL
Anion gap: 10 (ref 5–15)
BUN: 47 mg/dL — ABNORMAL HIGH (ref 8–23)
CO2: 27 mmol/L (ref 22–32)
Calcium: 9.1 mg/dL (ref 8.9–10.3)
Chloride: 100 mmol/L (ref 98–111)
Creatinine, Ser: 0.94 mg/dL (ref 0.61–1.24)
GFR, Estimated: >60 mL/min (ref >60)
Glucose, Bld: 321 mg/dL — ABNORMAL HIGH (ref 70–99)
Potassium: 4.2 mmol/L (ref 3.5–5.1)
Sodium: 137 mmol/L (ref 135–145)

## 2021-01-22 LAB — GLUCOSE, CAPILLARY
Glucose-Capillary: 307 mg/dL — ABNORMAL HIGH (ref 70–99)
Glucose-Capillary: 246 mg/dL — ABNORMAL HIGH (ref 70–99)
Glucose-Capillary: 319 mg/dL — ABNORMAL HIGH (ref 70–99)
Glucose-Capillary: 289 mg/dL — ABNORMAL HIGH (ref 70–99)

## 2021-01-22 MED ORDER — MAGIC MOUTHWASH
10.0000 mL | Freq: Three times a day (TID) | ORAL | Status: DC
  Administered 2021-01-22 – 2021-01-24 (×7): 10 mL via ORAL
  Filled 2021-01-22 (×7): qty 10

## 2021-01-22 MED ORDER — INSULIN ASPART 100 UNIT/ML ~~LOC~~ SOLN
10.0000 [IU] | Freq: Three times a day (TID) | SUBCUTANEOUS | Status: DC
  Administered 2021-01-22 – 2021-01-23 (×4): 10 [IU] via SUBCUTANEOUS
  Administered 2021-01-24: 5 [IU] via SUBCUTANEOUS
  Administered 2021-01-24 (×2): 10 [IU] via SUBCUTANEOUS
  Filled 2021-01-22 (×7): qty 1

## 2021-01-22 MED ORDER — METHYLPREDNISOLONE SODIUM SUCC 40 MG IJ SOLR
40.0000 mg | INTRAMUSCULAR | Status: DC
  Administered 2021-01-23 – 2021-01-24 (×2): 40 mg via INTRAVENOUS
  Filled 2021-01-22 (×2): qty 1

## 2021-01-22 MED ORDER — INSULIN ASPART 100 UNIT/ML ~~LOC~~ SOLN
10.0000 [IU] | Freq: Once | SUBCUTANEOUS | Status: AC
  Administered 2021-01-22: 10 [IU] via SUBCUTANEOUS

## 2021-01-22 MED ORDER — METHYLPREDNISOLONE SODIUM SUCC 40 MG IJ SOLR: 40.0000 mg | INTRAMUSCULAR | Status: DC

## 2021-01-22 NOTE — Progress Notes (Signed)
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 In the ED he was found to be hypoxemic with O2 sats 87% on room air.  Which improved with 3 L/min.  He tested positive for COVID-19.  Chest x-ray was negative for infiltrates.  Vaccination status-2 doses of Pfizer  Subjective   Patient seen and examined, denies shortness of breath.  He was started on doxycycline yesterday.  Still on 5 L/min of oxygen via nasal cannula.    Assessment/Plan:     1. Acute on chronic hypoxemic respiratory failure-improved, secondary to COVID-19 pneumonia-continues to be short of breath with high oxygen requirement.  Completed remdesivir on 01/15/2021.  Solu-Medrol dose has been cut down to 40 mg IV every 24 hours.    Scheduled Mucinex, Tussionex as needed.  Continue inhalers per orders.  CRP is down to 5.4 this morning. 2. COPD exacerbation-significantly improved, no rhonchi auscultated.  Still requiring 5 L/min of oxygen.   Continue DuoNeb nebulizers every 6 hours, Solu-Medrol dose has been cut down to 40 mg IV every 24 hours.   Continue Mucinex 1200 mg p.o. twice daily.  Patient started on  doxycycline 100 mg p.o. twice daily 3. Hyperkalemia-treated with Lokelma, resolved. 4. Transaminitis-not present admission, likely from remdesivir.  Remdesivir treatment has been completed. 5. Odynophagia-continue Carafate, PPI. 6. Diabetes mellitus type 2-steroid-induced hyperglycemia-  CBG is elevated, will cut down meal coverage of NovoLog to 10 units 3 times daily  with meals.  Continue Lantus 28 units subcu daily.  Dose of Solu-Medrol has been cut down to 40 mg IV every 24 hours.   7. Chronic systolic CHF-EF 45 to 50%, no pulmonary edema seen on chest x-ray.  BNP 181 on admission.  Lasix and losartan resumed.  AKI resolved. Continue metoprolol.  Continue to follow BMP. 8. Leukocytosis-WBC is elevated to 22,000, likely from steroids.  Follow CBC in a.m. 9. hypertension-continue multipin 10. anxiety with panic attacks-continue home dose of Xanax. 11. Hyperlipidemia-continue Zetia     COVID-19 Labs  Recent Labs    01/20/21 0446 01/21/21 0722  CRP 10.1* 5.4*    Lab Results  Component Value Date   SARSCOV2NAA NEGATIVE 12/02/2019     Scheduled medications:   . ALPRAZolam  0.5 mg Oral BID  . amLODipine  5 mg Oral Daily  . arformoterol  15 mcg Nebulization BID  . vitamin C  500 mg Oral Daily  . aspirin EC  81 mg Oral Daily  . cholecalciferol  1,000 Units Oral BH-q7a  . doxycycline  100 mg Oral Q12H  . enoxaparin (LOVENOX) injection  40 mg Subcutaneous Q24H  . ezetimibe  10 mg Oral QHS  . fluticasone  2 puff Inhalation BID  . folic acid  500 mcg Oral QPM  . furosemide  40 mg Oral Daily  . guaiFENesin  1,200 mg Oral BID  . insulin aspart  0-15 Units Subcutaneous TID WC  . insulin aspart  0-5 Units Subcutaneous QHS  . insulin aspart  10 Units Subcutaneous TID WC  . insulin glargine  28 Units Subcutaneous  QHS  . ipratropium-albuterol  3 mL Nebulization Q6H WA  . losartan  50 mg Oral Daily  . [START ON 01/23/2021] methylPREDNISolone (SOLU-MEDROL) injection  40 mg Intravenous Q24H  . metoprolol succinate  100 mg Oral Daily  . pantoprazole  40 mg Oral BID  . polyethylene glycol  17 g Oral Daily  . sucralfate  1 g Oral TID WC & HS  . traZODone  50 mg Oral QHS  . zinc sulfate  220 mg Oral Daily         CBG: Recent Labs  Lab 01/21/21 1131 01/21/21 1527 01/21/21 2128 01/22/21 0750 01/22/21 1133  GLUCAP 270* 182* 160* 307* 246*     SpO2: 99 % O2 Flow Rate (L/min): 5 L/min    CBC: Recent Labs  Lab 01/16/21 0507 01/20/21 0446  WBC 14.0* 22.1*  NEUTROABS 12.4*  --   HGB 14.6 14.2  HCT 41.9 40.9  MCV 88.2 88.1  PLT 203 243    Basic Metabolic Panel: Recent Labs  Lab 01/18/21 0510 01/19/21 0501 01/20/21 0446 01/21/21 0722 01/22/21 0332  NA 141 138 134* 136 137  K 4.4 3.9 4.0 4.3 4.2  CL 105 101 100 100 100  CO2 29 27 29 28 27   GLUCOSE 209* 109* 244* 417* 321*  BUN 46* 39* 39* 46* 47*  CREATININE 0.83 0.88 0.94 0.98 0.94  CALCIUM 8.7* 8.6* 8.7* 9.1 9.1     Liver Function Tests: Recent Labs  Lab 01/17/21 0730 01/18/21 0510 01/19/21 0501 01/20/21 0446 01/21/21 0722  AST 24 49* 32 32 28  ALT 69* 107* 94* 104* 98*  ALKPHOS 67 62 60 65 87  BILITOT 0.9 0.8 1.0 1.0 0.9  PROT 5.8* 5.5* 5.2* 5.6* 5.9*  ALBUMIN 2.7* 2.5* 2.5* 2.3* 2.4*     Antibiotics: Anti-infectives (From admission, onward)   Start     Dose/Rate Route Frequency Ordered Stop   01/20/21 2200  doxycycline (VIBRA-TABS) tablet 100 mg        100 mg Oral Every 12 hours 01/20/21 1533     01/12/21 1000  remdesivir 100 mg in sodium chloride 0.9 % 100 mL IVPB       "Followed by" Linked Group Details   100 mg 200 mL/hr over 30 Minutes Intravenous Daily 01/11/21 0729 01/15/21 0910   01/11/21 0830  remdesivir 200 mg in sodium chloride 0.9% 250 mL IVPB       "Followed by" Linked Group Details   200 mg 580 mL/hr over 30 Minutes Intravenous Once 01/11/21 0729 01/11/21 1104       DVT prophylaxis: Lovenox  Code Status: Full code  Family Communication: No family at bedside   Consultants:    Procedures:      Objective   Vitals:   01/21/21 2129 01/22/21 0530 01/22/21 0749 01/22/21 1150  BP: (!) 151/76 (!) 152/71 (!) 141/62 (!) 132/59  Pulse: 81 81 65 78  Resp: 16 18 15 18   Temp: 98 F (36.7 C) 98.3 F (36.8 C) (!) 97 F (36.1 C) 98.2 F (36.8 C)  TempSrc: Oral Oral Oral Oral  SpO2: 95% 93% 100% 99%  Weight:       Height:        Intake/Output Summary (Last 24 hours) at 01/22/2021 1341 Last data filed at 01/22/2021 1316 Gross per 24 hour  Intake -  Output 1100 ml  Net -1100 ml    02/04 1901 - 02/06 0700 In: -  Out: 2640 [Urine:2640]  04/06  01/19/21 0436 01/20/21 0500 01/21/21 0200  Weight: 82.8 kg 82.9 kg 82.9 kg    Physical Examination:  General-appears in no acute distress Heart-S1-S2, regular, no murmur auscultated Lungs-clear to auscultation bilaterally, no wheezing or crackles auscultated. Abdomen-soft, nontender, no organomegaly Extremities-no edema in the lower extremities Neuro-alert, oriented x3, no focal deficit noted  Status is: Inpatient  Dispo: The patient is from: Home              Anticipated d/c is to: Home              Anticipated d/c date is: 01/23/2021              Patient currently not stable for discharge  Barrier to discharge-COPD exacerbation      Data Reviewed:   Recent Results (from the past 240 hour(s))  C Difficile Quick Screen w PCR reflex     Status: None   Collection Time: 01/12/21  7:12 PM   Specimen: STOOL  Result Value Ref Range Status   C Diff antigen NEGATIVE NEGATIVE Final   C Diff toxin NEGATIVE NEGATIVE Final   C Diff interpretation No C. difficile detected.  Final    Comment: Performed at [facility], [address] [place], [place] [address]    No results for input(s): LIPASE, AMYLASE in the last 168 hours. No results for input(s): AMMONIA in the last 168 hours.  Cardiac Enzymes: No results for input(s): CKTOTAL, CKMB, CKMBINDEX, TROPONINI in the last 168 hours. BNP (last 3 results) Recent Labs    01/11/21 0538 01/13/21 0434  BNP 181.6* 591.1*        Harold Parker S Amoy Steeves   [facility] If 7PM-7AM, please contact night-coverage at [email or web address], Office  [phone]   01/22/2021, 1:41 PM  LOS: 11 days            Was well

## 2021-01-23 ENCOUNTER — Encounter: Payer: Self-pay | Admitting: *Deleted

## 2021-01-23 ENCOUNTER — Ambulatory Visit: Payer: Medicare Other

## 2021-01-23 DIAGNOSIS — J9601 Acute respiratory failure with hypoxia: Secondary | ICD-10-CM | POA: Diagnosis not present

## 2021-01-23 DIAGNOSIS — J449 Chronic obstructive pulmonary disease, unspecified: Secondary | ICD-10-CM | POA: Diagnosis not present

## 2021-01-23 DIAGNOSIS — U071 COVID-19: Secondary | ICD-10-CM | POA: Diagnosis not present

## 2021-01-23 LAB — GLUCOSE, CAPILLARY
Glucose-Capillary: 228 mg/dL — ABNORMAL HIGH (ref 70–99)
Glucose-Capillary: 184 mg/dL — ABNORMAL HIGH (ref 70–99)
Glucose-Capillary: 285 mg/dL — ABNORMAL HIGH (ref 70–99)
Glucose-Capillary: 215 mg/dL — ABNORMAL HIGH (ref 70–99)

## 2021-01-23 NOTE — Progress Notes (Signed)
PT Cancellation Note  Patient Details Name: Pal Shell MRN: [id number] DOB: June 20, 1946   Cancelled Treatment:    Reason Eval/Treat Not Completed: Fatigue/lethargy limiting ability to participate   Offered and encouraged session.  Stated he was feeling poorly today "like the weather" and was waiting to get washed and dressed.  Stated he has been walking on his own in the room but declined session at this time.     Danielle Dess 01/23/2021, 10:36 AM

## 2021-01-23 NOTE — Care Management Important Message (Signed)
Important Message  Patient Details  Name: Harold Parker MRN: [id number] Date of Birth: 01/01/1946   Medicare Important Message Given:  Yes     Allayne Butcher, RN 01/23/2021, 12:13 PM

## 2021-01-23 NOTE — Progress Notes (Signed)
Daily update given to [facility]

## 2021-01-23 NOTE — TOC Progression Note (Signed)
Transition of Care [facility]) - Progression Note    Patient Details  Name: Harold Parker MRN: [id number] Date of Birth: 07-06-1946  [facility]) CM/SW Contact  Allayne Butcher, RN Phone Number: 01/23/2021, 4:03 PM  Clinical Narrative:    Patient not medically cleared for discharge.  Patient is currently on [place] at 4L.  Patient has no PT needs.    Expected Discharge Plan: Home/Self Care Barriers to Discharge: Continued Medical Work up  Expected Discharge Plan and Services Expected Discharge Plan: Home/Self Care   Discharge Planning Services: CM Consult   Living arrangements for the past 2 months: Single Family Home                           HH Arranged: NA           Social Determinants of Health (SDOH) Interventions    Readmission Risk Interventions Readmission Risk Prevention Plan 01/13/2021  Transportation Screening Complete  PCP or Specialist Appt within 3-5 Days Complete  HRI or Home Care Consult Complete  Social Work Consult for Recovery Care Planning/Counseling Complete  Palliative Care Screening Not Applicable  Medication Review Oceanographer) Complete  Some recent data might be hidden

## 2021-01-23 NOTE — Progress Notes (Signed)
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 In the ED he was found to be hypoxemic with O2 sats 87% on room air.  Which improved with 3 L/min.  He tested positive for COVID-19.  Chest x-ray was negative for infiltrates.  Vaccination status-2 doses of Pfizer  Subjective   Patient seen and examined, breathing is improved though not back to baseline.  Still coughing up phlegm.    Assessment/Plan:     1. Acute on chronic hypoxemic respiratory failure-improved, secondary to COVID-19 pneumonia-he has slowly improved, still requiring 4 to 5 L/min oxygen via nasal cannula.  Remdesivir was completed on 01/15/2021.    Solu-Medrol dose has been cut down to 40 mg IV every 24 hours.    Scheduled Mucinex, Tussionex as needed.  Continue inhalers per orders.  CRP is down to 5.4 this morning. 2. COPD exacerbation-significantly improved, no rhonchi auscultated.  Still requiring 4 to 5 L/min of oxygen via [place].   Continue DuoNeb nebulizers every 6 hours, Solu-Medrol dose has been cut down to 40 mg IV every 24 hours.   Continue Mucinex 1200 mg p.o. twice daily.  Patient started on  doxycycline 100 mg p.o. twice daily 3. Hyperkalemia-treated with Lokelma, resolved. 4. Transaminitis-not present admission, likely from remdesivir.  Remdesivir treatment has been completed. 5. Odynophagia-continue Carafate, PPI. 6. Diabetes mellitus type 2-steroid-induced hyperglycemia-CBG still elevated, will increase NovoLog to 30 units 3 times daily with meals, change  Lantus to 30 units subcu daily.   Dose of Solu-Medrol has been cut down to 40 mg IV every 24 hours.   7. Chronic systolic CHF-EF 45 to 50%, no pulmonary edema seen on chest x-ray.  BNP 181 on admission.  Lasix and losartan resumed.  AKI resolved. Continue metoprolol.  Continue to follow BMP. 8. Leukocytosis-WBC is elevated to 22,000, likely from steroids.  Follow CBC in a.m. 9. hypertension-continue amlodipine 10. anxiety with panic attacks-continue home dose of Xanax. 11. Hyperlipidemia-continue Zetia     COVID-19 Labs  Recent Labs    01/21/21 0722  CRP 5.4*    Lab Results  Component Value Date   SARSCOV2NAA NEGATIVE 12/02/2019     Scheduled medications:   . ALPRAZolam  0.5 mg Oral BID  . amLODipine  5 mg Oral Daily  . arformoterol  15 mcg Nebulization BID  . vitamin C  500 mg Oral Daily  . aspirin EC  81 mg Oral Daily  . cholecalciferol  1,000 Units Oral BH-q7a  . doxycycline  100 mg Oral Q12H  . enoxaparin (LOVENOX) injection  40 mg Subcutaneous Q24H  . ezetimibe  10 mg Oral QHS  . fluticasone  2 puff Inhalation BID  . folic acid  500 mcg Oral QPM  . furosemide  40 mg Oral Daily  . guaiFENesin  1,200 mg Oral BID  . insulin aspart  0-15 Units Subcutaneous TID WC  . insulin aspart  0-5 Units Subcutaneous QHS  . insulin aspart  10 Units Subcutaneous TID WC  . insulin glargine  28 Units Subcutaneous QHS  . ipratropium-albuterol  3 mL Nebulization Q6H WA  . losartan  50 mg Oral Daily  . magic mouthwash  10 mL Oral TID  . methylPREDNISolone (SOLU-MEDROL) injection  40 mg Intravenous Q24H  . metoprolol succinate  100 mg Oral Daily  . pantoprazole  40 mg Oral BID  . polyethylene glycol  17 g Oral Daily  . sucralfate  1 g Oral TID WC & HS  . traZODone  50 mg Oral QHS  . zinc sulfate  220 mg Oral Daily         CBG: Recent Labs  Lab 01/22/21 0750 01/22/21 1133 01/22/21 1637 01/22/21 2007 01/23/21 0727  GLUCAP 307* 246* 319* 289* 228*    SpO2: 95 % O2 Flow  Rate (L/min): 4 L/min    CBC: Recent Labs  Lab 01/20/21 0446  WBC 22.1*  HGB 14.2  HCT 40.9  MCV 88.1  PLT 243    Basic Metabolic Panel: Recent Labs  Lab 01/18/21 0510 01/19/21 0501 01/20/21 0446 01/21/21 0722 01/22/21 0332  NA 141 138 134* 136 137  K 4.4 3.9 4.0 4.3 4.2  CL 105 101 100 100 100  CO2 29 27 29 28 27   GLUCOSE 209* 109* 244* 417* 321*  BUN 46* 39* 39* 46* 47*  CREATININE 0.83 0.88 0.94 0.98 0.94  CALCIUM 8.7* 8.6* 8.7* 9.1 9.1     Liver Function Tests: Recent Labs  Lab 01/17/21 0730 01/18/21 0510 01/19/21 0501 01/20/21 0446 01/21/21 0722  AST 24 49* 32 32 28  ALT 69* 107* 94* 104* 98*  ALKPHOS 67 62 60 65 87  BILITOT 0.9 0.8 1.0 1.0 0.9  PROT 5.8* 5.5* 5.2* 5.6* 5.9*  ALBUMIN 2.7* 2.5* 2.5* 2.3* 2.4*     Antibiotics: Anti-infectives (From admission, onward)   Start     Dose/Rate Route Frequency Ordered Stop   01/20/21 2200  doxycycline (VIBRA-TABS) tablet 100 mg        100 mg Oral Every 12 hours 01/20/21 1533     01/12/21 1000  remdesivir 100 mg in sodium chloride 0.9 % 100 mL IVPB       "Followed by" Linked Group Details   100 mg 200 mL/hr over 30 Minutes Intravenous Daily 01/11/21 0729 01/15/21 0910   01/11/21 0830  remdesivir 200 mg in sodium chloride 0.9% 250 mL IVPB       "Followed by" Linked Group Details   200 mg 580 mL/hr over 30 Minutes Intravenous Once 01/11/21 0729 01/11/21 1104       DVT prophylaxis: Lovenox  Code Status: Full code  Family Communication: Discussed  with patient wife on phone on 01/22/2021   Consultants:    Procedures:      Objective   Vitals:   01/23/21 0500 01/23/21 0727 01/23/21 0754 01/23/21 0841  BP:  (!) 158/69    Pulse:  75 74 80  Resp:  18 16   Temp:  97.8 F (36.6 C)    TempSrc:      SpO2:  96% 93% 95%  Weight: 86.3 kg     Height:        Intake/Output Summary (Last 24 hours) at 01/23/2021 1150 Last data filed at 01/23/2021 1023 Gross per 24 hour  Intake --  Output 1625 ml   Net -1625 ml    02/05 1901 - 02/07 0700 In: -  Out: 1575 [Urine:1575]  Filed Weights   01/20/21 0500 01/21/21 0200 01/23/21 0500  Weight: 82.9 kg 82.9 kg 86.3 kg    Physical Examination:  General-appears in no acute distress Heart-S1-S2, regular, no murmur auscultated Lungs-clear to auscultation bilaterally, no wheezing or crackles auscultated Abdomen-soft, nontender, no organomegaly Extremities-no edema in the lower extremities Neuro-alert, oriented x3, no focal deficit noted  Status is: Inpatient  Dispo: The patient is from: Home              Anticipated d/c is to: Home              Anticipated d/c date is: 01/25/2021              Patient currently not stable for discharge  Barrier to discharge-COPD exacerbation      Data Reviewed:   No results found for this or any previous visit (from the past 240 hour(s)).  No results for input(s): LIPASE, AMYLASE in the last 168 hours. No results for input(s): AMMONIA in the last 168 hours.  Cardiac Enzymes: No results for input(s): CKTOTAL, CKMB, CKMBINDEX, TROPONINI in the last 168 hours. BNP (last 3 results) Recent Labs    01/11/21 0538 01/13/21 0434  BNP 181.6* 591.1*        Kaylor Simenson S Abel Ra   [facility] If 7PM-7AM, please contact night-coverage at [email or web address], Office  [phone]   01/23/2021, 11:50 AM  LOS: 12 days            Was well

## 2021-01-23 NOTE — Progress Notes (Signed)
Physical Therapy Treatment Patient Details Name: Harold Parker MRN: [id number] DOB: 01-15-1946 Today's Date: 01/23/2021    History of Present Illness Presented to ER secondary to SOB, fever; admitted for management of sepsis, acute respiratory failure related to COVID-19 (diagnosed 01/04/21), AECOPD.    PT Comments    Patient received in bed, agreeable to walk, reports his O2 line is hung up under the bed. He states he is feeling yucky today "like the weather." SOB with activity. O2 sats in 90%s prior to ambulation. Patient is mod independent with bed mobility and transfers. Ambulated 250 feet without ad, however occasional reaching out for rail in hallway. He ambulates with slow cadence. O2 sats down to 88% after walking on 4 lpm. Patient will continue to benefit from skilled PT while here to improve activity tolerance and safety.     Follow Up Recommendations  No PT follow up;Other (comment)     Equipment Recommendations  None recommended by PT    Recommendations for Other Services       Precautions / Restrictions Precautions Precaution Comments: mod fall Restrictions Weight Bearing Restrictions: No    Mobility  Bed Mobility Overal bed mobility: Independent                Transfers Overall transfer level: Modified independent Equipment used: None Transfers: Sit to/from Stand Sit to Stand: Modified independent (Device/Increase time)         General transfer comment: no physical assist  Ambulation/Gait Ambulation/Gait assistance: Supervision Gait Distance (Feet): 250 Feet Assistive device: None Gait Pattern/deviations: WFL(Within Functional Limits) Gait velocity: decreased   General Gait Details: Patient ambulated 250 feet with slow cadence. Generally steady, occasionally reaching for rail in hallway. O2 sats down to 88% after walking.   Stairs             Wheelchair Mobility    Modified Rankin (Stroke Patients Only)       Balance Overall  balance assessment: Modified Independent;Mild deficits observed, not formally tested Sitting-balance support: Feet supported Sitting balance-Leahy Scale: Good     Standing balance support: During functional activity;No upper extremity supported Standing balance-Leahy Scale: Good                              Cognition Arousal/Alertness: Awake/alert Behavior During Therapy: WFL for tasks assessed/performed Overall Cognitive Status: Within Functional Limits for tasks assessed                                        Exercises      General Comments        Pertinent Vitals/Pain Pain Assessment: No/denies pain    Home Living                      Prior Function            PT Goals (current goals can now be found in the care plan section) Acute Rehab PT Goals Patient Stated Goal: to return home and back to pulmonary rehab PT Goal Formulation: With patient Time For Goal Achievement: 01/27/21 Potential to Achieve Goals: Good Progress towards PT goals: Progressing toward goals    Frequency    Min 2X/week      PT Plan Current plan remains appropriate    Co-evaluation  AM-PAC PT "6 Clicks" Mobility   Outcome Measure  Help needed turning from your back to your side while in a flat bed without using bedrails?: None Help needed moving from lying on your back to sitting on the side of a flat bed without using bedrails?: None Help needed moving to and from a bed to a chair (including a wheelchair)?: None Help needed standing up from a chair using your arms (e.g., wheelchair or bedside chair)?: None Help needed to walk in hospital room?: None Help needed climbing 3-5 steps with a railing? : A Little 6 Click Score: 23    End of Session Equipment Utilized During Treatment: Oxygen Activity Tolerance: Patient tolerated treatment well Patient left: in bed;with call bell/phone within reach Nurse Communication: Mobility  status PT Visit Diagnosis: Muscle weakness (generalized) (M62.81);Difficulty in walking, not elsewhere classified (R26.2)     Time: 1310-1330 PT Time Calculation (min) (ACUTE ONLY): 20 min  Charges:  $Gait Training: 8-22 mins                     [employer], PT, GCS 01/23/21,2:24 PM

## 2021-01-24 DIAGNOSIS — U071 COVID-19: Secondary | ICD-10-CM | POA: Diagnosis not present

## 2021-01-24 DIAGNOSIS — I2510 Atherosclerotic heart disease of native coronary artery without angina pectoris: Secondary | ICD-10-CM | POA: Diagnosis not present

## 2021-01-24 DIAGNOSIS — J9601 Acute respiratory failure with hypoxia: Secondary | ICD-10-CM | POA: Diagnosis not present

## 2021-01-24 DIAGNOSIS — F419 Anxiety disorder, unspecified: Secondary | ICD-10-CM | POA: Diagnosis not present

## 2021-01-24 LAB — CBC
HCT: 44.4 % (ref 39.0–52.0)
Hemoglobin: 14.8 g/dL (ref 13.0–17.0)
MCH: 30.1 pg (ref 26.0–34.0)
MCHC: 33.3 g/dL (ref 30.0–36.0)
MCV: 90.4 fL (ref 80.0–100.0)
Platelets: 230 10*3/uL (ref 150–400)
RBC: 4.91 MIL/uL (ref 4.22–5.81)
RDW: 13.6 % (ref 11.5–15.5)
WBC: 13.8 10*3/uL — ABNORMAL HIGH (ref 4.0–10.5)
nRBC: 0 % (ref 0.0–0.2)

## 2021-01-24 LAB — GLUCOSE, CAPILLARY
Glucose-Capillary: 110 mg/dL — ABNORMAL HIGH (ref 70–99)
Glucose-Capillary: 250 mg/dL — ABNORMAL HIGH (ref 70–99)
Glucose-Capillary: 292 mg/dL — ABNORMAL HIGH (ref 70–99)

## 2021-01-24 MED ORDER — NYSTATIN 100000 UNIT/ML MT SUSP: 5.0000 mL | Freq: Four times a day (QID) | OROMUCOSAL | 0 refills | Status: AC

## 2021-01-24 MED ORDER — SUCRALFATE 1 GM/10ML PO SUSP: 1.0000 g | Freq: Three times a day (TID) | ORAL | 0 refills | Status: DC

## 2021-01-24 MED ORDER — PREDNISONE 10 MG PO TABS: ORAL_TABLET | ORAL | 0 refills | Status: DC

## 2021-01-24 MED ORDER — GUAIFENESIN ER 600 MG PO TB12: 1200.0000 mg | ORAL_TABLET | Freq: Two times a day (BID) | ORAL | 0 refills | Status: AC

## 2021-01-24 NOTE — TOC Transition Note (Signed)
Transition of Care [facility]) - CM/SW Discharge Note   Patient Details  Name: Tri Chittick MRN: [id number] Date of Birth: [birth year]  [facility] CM/SW Contact:  Allayne Butcher, RN Phone Number: 01/24/2021, 2:57 PM   Clinical Narrative:    Patient is medically cleared for discharge to home.  Patient is already set up with oxygen through [employer] at 2L but patient will require 4L going home.  Zach with [employer] notified of order for increase in oxygen for home use.  [employer] will reach out to the patient to discuss if any additional equipment is needed at home.  Patient has his cell phone on him.  Patient's significant other is to pick him up today.    Final next level of care: Home/Self Care Barriers to Discharge: No Barriers Identified   Patient Goals and CMS Choice Patient states their goals for this hospitalization and ongoing recovery are:: just wants to get better and get back home CMS Medicare.gov Compare Post Acute Care list provided to:: Patient Choice offered to / list presented to : Patient  Discharge Placement                       Discharge Plan and Services   Discharge Planning Services: CM Consult            DME Arranged: Oxygen DME Agency: [employer] Date DME Agency Contacted: 01/24/21 Time DME Agency Contacted: 1430 Representative spoke with at DME Agency: Zack HH Arranged: NA          Social Determinants of Health (SDOH) Interventions     Readmission Risk Interventions Readmission Risk Prevention Plan 01/13/2021  Transportation Screening Complete  PCP or Specialist Appt within 3-5 Days Complete  HRI or Home Care Consult Complete  Social Work Consult for Recovery Care Planning/Counseling Complete  Palliative Care Screening Not Applicable  Medication Review Oceanographer) Complete  Some recent data might be hidden

## 2021-01-24 NOTE — Progress Notes (Signed)
SATURATION QUALIFICATIONS: (This note is used to comply with regulatory documentation for home oxygen)   Patient Saturations on 4 Liters of oxygen while Ambulating = 88%

## 2021-01-24 NOTE — Progress Notes (Signed)
Per Dr Sharl Ma only give 5 units of scheduled insulin at breakfast BS 110

## 2021-01-24 NOTE — Progress Notes (Signed)
SATURATION QUALIFICATIONS: (This note is used to comply with regulatory documentation for home oxygen)  Patient Saturations on Room Air at Rest = 86%   Patient Saturations on 4 Liters of oxygen while Ambulating = 88%

## 2021-01-24 NOTE — Discharge Summary (Signed)
Physician Discharge Summary  Harold Parker JQZ:[id number] DOB: 04/18/1946 DOA: 01/11/2021  PCP: Merlene Laughter, MD  Admit date: 01/11/2021 Discharge date: 01/24/2021  Time spent: 50* minutes  Recommendations for Outpatient Follow-up:  1. Follow-up PCP in 2 weeks 2. Follow LFTs as outpatient in 2 weeks.  Discharge Diagnoses:  Principal Problem:   Acute hypoxemic respiratory failure due to COVID-19 [facility]) Active Problems:   CAD (coronary artery disease)   Type II diabetes mellitus with renal manifestations (HCC)   Hyperlipidemia LDL goal <70   HTN (hypertension)   Chronic systolic CHF (congestive heart failure) (HCC)   COPD (chronic obstructive pulmonary disease) (HCC)   GERD (gastroesophageal reflux disease)   Anxiety   Acute renal failure superimposed on stage 2 chronic kidney disease (HCC)   Hyperkalemia   Elevated troponin   Discharge Condition: Stable  Diet recommendation: Heart healthy diet  Filed Weights   01/21/21 0200 01/23/21 0500 01/24/21 0500  Weight: 82.9 kg 86.3 kg 87 kg    History of present illness:  75 year old male with medical history of COPD on 2 L of oxygen, CAD, hypertension, hyperlipidemia, diabetes mellitus type 2, 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 In the ED he was found to be hypoxemic with O2 sats 87% on room air.  Which improved with 3 L/min.  He tested positive for COVID-19.  Chest x-ray was negative for infiltrates.  Vaccination status-2 doses of Pfizer   Hospital Course:  1. Acute on chronic hypoxemic respiratory failure-improved, secondary to COVID-19 pneumonia-he has slowly improved, still requiring 4  L/min oxygen via nasal cannula.  Remdesivir was completed on 01/15/2021.    Solu-Medrol dose has been cut down to 40 mg IV  every 24 hours.    Scheduled Mucinex, T  Continue inhalers per orders.  CRP is down to 5.4.  Patient is now stable for discharge, will be discharged on 4 L/min of oxygen via nasal cannula.  Will send home on prednisone taper for 4 more days. 2. COPD exacerbation-significantly improved, no rhonchi auscultated.  Still requiring 4 to 5 L/min of oxygen via [place].   Continue DuoNeb nebulizers every 6 hours, Solu-Medrol dose has been cut down to 40 mg IV every 24 hours.   Continue Mucinex 1200 mg p.o. twice daily.  Patient completed doxycycline in the hospital.  Will discharge on prednisone taper for 4 more days. 3. Hyperkalemia-treated with Lokelma, resolved. 4. Transaminitis-not present admission, likely from remdesivir.  Remdesivir treatment has been completed.  Follow LFTs in 2 weeks. 5. Odynophagia-continue Carafate, PPI. 6. Diabetes mellitus type 2-steroid-induced hyperglycemia-CBG still elevated, will increase NovoLog to 30 units 3 times daily with meals, change Lantus to 30 units subcu daily.   Dose of Solu-Medrol has been cut down to 40 mg IV every 24 hours.   7. Chronic systolic CHF-EF 45 to 50%, no pulmonary edema seen on chest x-ray.  BNP 181 on admission.  Lasix and losartan resumed.  AKI resolved. Continue metoprolol.  Continue to follow BMP. 8. Leukocytosis-WBC was elevated to 22,000, likely from steroids.  WBC is down to 13.8 today. 9. Hypertension-continue amlodipine 10. Anxiety with panic attacks-continue home dose of Xanax. 11. Hyperlipidemia-continue Zetia   Procedures:    Consultations:    Discharge Exam: Vitals:   01/24/21 0900 01/24/21 1114  BP:  128/77  Pulse:  85  Resp:  17  Temp:  98.9 F (37.2 C)  SpO2: 94% (!) 89%    General: Appears in no acute distress Cardiovascular: S1-S2, regular Respiratory: Clear to auscultation bilaterally  Discharge Instructions   Discharge Instructions    Diet - low sodium heart healthy   Complete by: As directed    Increase  activity slowly   Complete by: As directed      Allergies as of 01/24/2021      Reactions   Atorvastatin Other (See Comments)   Leg aches and weakness      Medication List    TAKE these medications   acetaminophen 500 MG tablet Commonly known as: TYLENOL Take 1,000 mg by mouth daily as needed for moderate pain or headache.   ALPRAZolam 0.5 MG tablet Commonly known as: XANAX Take 0.5 mg by mouth 2 (two) times daily.   amLODipine 5 MG tablet Commonly known as: NORVASC Take 5 mg by mouth daily.   aspirin EC 81 MG tablet Take 81 mg by mouth daily.   Breztri Aerosphere 160-9-4.8 MCG/ACT Aero Generic drug: Budeson-Glycopyrrol-Formoterol TAKE 2 PUFFS BY MOUTH TWICE A DAY (STOP TRELEGY)   esomeprazole 40 MG capsule Commonly known as: NEXIUM Take 40 mg by mouth at bedtime.   ezetimibe 10 MG tablet Commonly known as: ZETIA Take 10 mg by mouth at bedtime.   folic acid 400 MCG tablet Commonly known as: FOLVITE Take 400 mcg by mouth every evening.   furosemide 40 MG tablet Commonly known as: LASIX TAKE 1 TABLET BY MOUTH EVERY DAY   glipiZIDE 5 MG tablet Commonly known as: GLUCOTROL Take 0.5 tablets (2.5 mg total) by mouth daily before breakfast. What changed:   how much to take  when to take this  additional instructions   guaiFENesin 600 MG 12 hr tablet Commonly known as: MUCINEX Take 2 tablets (1,200 mg total) by mouth 2 (two) times daily for 5 days.   ipratropium-albuterol 0.5-2.5 (3) MG/3ML Soln Commonly known as: DUONEB Take 3 mLs by nebulization every 6 (six) hours as needed. What changed: reasons to take this   losartan 100 MG tablet Commonly known as: COZAAR TAKE 1 TABLET BY MOUTH EVERY DAY Please call to schedule appointment for further refills. Thank you! What changed: how much to take   Melatonin 10 MG Caps Take 10 mg by mouth at bedtime.   metFORMIN 1000 MG tablet Commonly known as: GLUCOPHAGE TAKE 1 TABLET BY MOUTH TWICE DAILY WITH A  MEAL What changed: See the new instructions.   metoprolol succinate 100 MG 24 hr tablet Commonly known as: TOPROL-XL TAKE 1 TABLET BY MOUTH EVERY DAY WITH OR IMMEDIATELY FOLLOWING A MEAL   nystatin 100000 UNIT/ML suspension Commonly known as: MYCOSTATIN Take 5 mLs (500,000 Units total) by mouth 4 (four) times daily for 3 days.   predniSONE 10 MG tablet Commonly known as: DELTASONE Prednisone 40 mg po daily x 1 day then Prednisone 30 mg po daily x 1 day then Prednisone 20 mg po daily x 1 day then Prednisone 10 mg daily x 1 day then stop... Start taking on: January 25, 2021   ProAir HFA 108 (90 Base) MCG/ACT inhaler Generic drug: albuterol Inhale 1 puff into the lungs every 4 (four) hours as needed for wheezing or shortness of breath.   sucralfate 1 GM/10ML suspension Commonly known as: CARAFATE Take 10 mLs (1 g total) by mouth 4 (four) times daily -  with meals and at bedtime.  vitamin C 1000 MG tablet Take 1,000 mg by mouth daily.   Vitamin D (Cholecalciferol) 25 MCG (1000 UT) Tabs Take 1,000 Units by mouth every morning.            Durable Medical Equipment  (From admission, onward)         Start     Ordered   01/24/21 1322  DME Oxygen  Once       Question Answer Comment  Length of Need Lifetime   Mode or (Route) Nasal cannula   Liters per Minute 4   Frequency Continuous (stationary and portable oxygen unit needed)   Oxygen conserving device Yes   Oxygen delivery system Gas      01/24/21 1326         Allergies  Allergen Reactions  . Atorvastatin Other (See Comments)    Leg aches and weakness       The results of significant diagnostics from this hospitalization (including imaging, microbiology, ancillary and laboratory) are listed below for reference.    Significant Diagnostic Studies: DG Chest Port 1 View  Result Date: 01/21/2021 CLINICAL DATA:  COPD exacerbation EXAM: PORTABLE CHEST 1 VIEW COMPARISON:  01/11/2021 FINDINGS: Stable borderline  heart size. Aortic valve replacement, CABG, and coronary stenting. Interstitial prominence is stable. There is no edema, air bronchogram, effusion, or pneumothorax. Mildly increased density at the level of the lingula, new. IMPRESSION: 1. Possible early infiltrate at the lingula.  Consider follow-up. 2. Otherwise stable. Electronically Signed   By: Marnee Spring M.D.   On: 01/21/2021 07:29   DG Chest Portable 1 View  Result Date: 01/11/2021 CLINICAL DATA:  COVID pneumonia, COPD, progressive dyspnea EXAM: PORTABLE CHEST 1 VIEW COMPARISON:  04/07/2018 FINDINGS: The lungs are symmetrically well expanded. Mild interstitial prominence at the lung bases is likely chronic in nature, but better appreciated on the current examination. No pneumothorax or pleural effusion. Aortic valve replacement and coronary artery bypass grafting has been performed. Cardiac size is within normal limits. Pulmonary vascularity is normal. No acute bone abnormality. IMPRESSION: No active disease. Electronically Signed   By: Helyn Numbers MD   On: 01/11/2021 06:18    Microbiology: No results found for this or any previous visit (from the past 240 hour(s)).   Labs: Basic Metabolic Panel: Recent Labs  Lab 01/18/21 0510 01/19/21 0501 01/20/21 0446 01/21/21 0722 01/22/21 0332  NA 141 138 134* 136 137  K 4.4 3.9 4.0 4.3 4.2  CL 105 101 100 100 100  CO2 29 27 29 28 27   GLUCOSE 209* 109* 244* 417* 321*  BUN 46* 39* 39* 46* 47*  CREATININE 0.83 0.88 0.94 0.98 0.94  CALCIUM 8.7* 8.6* 8.7* 9.1 9.1   Liver Function Tests: Recent Labs  Lab 01/18/21 0510 01/19/21 0501 01/20/21 0446 01/21/21 0722  AST 49* 32 32 28  ALT 107* 94* 104* 98*  ALKPHOS 62 60 65 87  BILITOT 0.8 1.0 1.0 0.9  PROT 5.5* 5.2* 5.6* 5.9*  ALBUMIN 2.5* 2.5* 2.3* 2.4*   No results for input(s): LIPASE, AMYLASE in the last 168 hours. No results for input(s): AMMONIA in the last 168 hours. CBC: Recent Labs  Lab 01/20/21 0446 01/24/21 0531   WBC 22.1* 13.8*  HGB 14.2 14.8  HCT 40.9 44.4  MCV 88.1 90.4  PLT 243 230   Cardiac Enzymes: No results for input(s): CKTOTAL, CKMB, CKMBINDEX, TROPONINI in the last 168 hours. BNP: BNP (last 3 results) Recent Labs    01/11/21 0538 01/13/21 0434  BNP 181.6* 591.1*    ProBNP (last 3 results) No results for input(s): PROBNP in the last 8760 hours.  CBG: Recent Labs  Lab 01/23/21 1156 01/23/21 1609 01/23/21 2118 01/24/21 0731 01/24/21 1114  GLUCAP 184* 285* 215* 110* 250*       Signed:  Meredeth Ide MD.  [facility] 01/24/2021, 1:27 PM

## 2021-01-25 ENCOUNTER — Ambulatory Visit: Payer: Medicare Other

## 2021-01-30 ENCOUNTER — Ambulatory Visit: Payer: Medicare Other

## 2021-02-01 ENCOUNTER — Ambulatory Visit: Payer: Medicare Other

## 2021-02-04 ENCOUNTER — Inpatient Hospital Stay: Payer: Medicare Other

## 2021-02-04 ENCOUNTER — Other Ambulatory Visit: Payer: Self-pay

## 2021-02-04 ENCOUNTER — Inpatient Hospital Stay
Admission: EM | Admit: 2021-02-04 | Discharge: 2021-02-09 | DRG: 190 | Disposition: A | Discharge status: Home / Self Care | Payer: Medicare Other | Attending: Internal Medicine | Admitting: Internal Medicine

## 2021-02-04 ENCOUNTER — Emergency Department: Payer: Medicare Other

## 2021-02-04 DIAGNOSIS — U099 Post covid-19 condition, unspecified: Secondary | ICD-10-CM | POA: Diagnosis present

## 2021-02-04 DIAGNOSIS — I110 Hypertensive heart disease with heart failure: Secondary | ICD-10-CM | POA: Diagnosis present

## 2021-02-04 DIAGNOSIS — Z955 Presence of coronary angioplasty implant and graft: Secondary | ICD-10-CM | POA: Diagnosis not present

## 2021-02-04 DIAGNOSIS — I5022 Chronic systolic (congestive) heart failure: Secondary | ICD-10-CM | POA: Diagnosis present

## 2021-02-04 DIAGNOSIS — I248 Other forms of acute ischemic heart disease: Secondary | ICD-10-CM | POA: Diagnosis not present

## 2021-02-04 DIAGNOSIS — Z951 Presence of aortocoronary bypass graft: Secondary | ICD-10-CM | POA: Diagnosis not present

## 2021-02-04 DIAGNOSIS — J441 Chronic obstructive pulmonary disease with (acute) exacerbation: Secondary | ICD-10-CM | POA: Diagnosis not present

## 2021-02-04 DIAGNOSIS — Z79899 Other long term (current) drug therapy: Secondary | ICD-10-CM | POA: Diagnosis not present

## 2021-02-04 DIAGNOSIS — J9621 Acute and chronic respiratory failure with hypoxia: Secondary | ICD-10-CM | POA: Diagnosis present

## 2021-02-04 DIAGNOSIS — Z87891 Personal history of nicotine dependence: Secondary | ICD-10-CM | POA: Diagnosis not present

## 2021-02-04 DIAGNOSIS — I5023 Acute on chronic systolic (congestive) heart failure: Secondary | ICD-10-CM | POA: Diagnosis present

## 2021-02-04 DIAGNOSIS — Z8249 Family history of ischemic heart disease and other diseases of the circulatory system: Secondary | ICD-10-CM

## 2021-02-04 DIAGNOSIS — I255 Ischemic cardiomyopathy: Secondary | ICD-10-CM | POA: Diagnosis present

## 2021-02-04 DIAGNOSIS — R778 Other specified abnormalities of plasma proteins: Secondary | ICD-10-CM

## 2021-02-04 DIAGNOSIS — J449 Chronic obstructive pulmonary disease, unspecified: Secondary | ICD-10-CM | POA: Diagnosis present

## 2021-02-04 DIAGNOSIS — Z953 Presence of xenogenic heart valve: Secondary | ICD-10-CM

## 2021-02-04 DIAGNOSIS — E785 Hyperlipidemia, unspecified: Secondary | ICD-10-CM | POA: Diagnosis present

## 2021-02-04 DIAGNOSIS — F419 Anxiety disorder, unspecified: Secondary | ICD-10-CM | POA: Diagnosis not present

## 2021-02-04 DIAGNOSIS — N289 Disorder of kidney and ureter, unspecified: Secondary | ICD-10-CM

## 2021-02-04 DIAGNOSIS — Z7982 Long term (current) use of aspirin: Secondary | ICD-10-CM

## 2021-02-04 DIAGNOSIS — I272 Pulmonary hypertension, unspecified: Secondary | ICD-10-CM | POA: Diagnosis present

## 2021-02-04 DIAGNOSIS — I472 Ventricular tachycardia: Secondary | ICD-10-CM | POA: Diagnosis present

## 2021-02-04 DIAGNOSIS — Z7984 Long term (current) use of oral hypoglycemic drugs: Secondary | ICD-10-CM

## 2021-02-04 DIAGNOSIS — R197 Diarrhea, unspecified: Secondary | ICD-10-CM | POA: Diagnosis present

## 2021-02-04 DIAGNOSIS — I252 Old myocardial infarction: Secondary | ICD-10-CM | POA: Diagnosis not present

## 2021-02-04 DIAGNOSIS — R918 Other nonspecific abnormal finding of lung field: Secondary | ICD-10-CM

## 2021-02-04 DIAGNOSIS — I4510 Unspecified right bundle-branch block: Secondary | ICD-10-CM | POA: Diagnosis present

## 2021-02-04 DIAGNOSIS — Z9981 Dependence on supplemental oxygen: Secondary | ICD-10-CM | POA: Diagnosis not present

## 2021-02-04 DIAGNOSIS — G4700 Insomnia, unspecified: Secondary | ICD-10-CM | POA: Diagnosis present

## 2021-02-04 DIAGNOSIS — E119 Type 2 diabetes mellitus without complications: Secondary | ICD-10-CM

## 2021-02-04 DIAGNOSIS — Z8616 Personal history of COVID-19: Secondary | ICD-10-CM

## 2021-02-04 DIAGNOSIS — N179 Acute kidney failure, unspecified: Secondary | ICD-10-CM | POA: Diagnosis present

## 2021-02-04 DIAGNOSIS — F064 Anxiety disorder due to known physiological condition: Secondary | ICD-10-CM | POA: Diagnosis present

## 2021-02-04 DIAGNOSIS — R911 Solitary pulmonary nodule: Secondary | ICD-10-CM | POA: Diagnosis present

## 2021-02-04 DIAGNOSIS — Z807 Family history of other malignant neoplasms of lymphoid, hematopoietic and related tissues: Secondary | ICD-10-CM

## 2021-02-04 DIAGNOSIS — R079 Chest pain, unspecified: Secondary | ICD-10-CM | POA: Diagnosis not present

## 2021-02-04 DIAGNOSIS — Z7951 Long term (current) use of inhaled steroids: Secondary | ICD-10-CM

## 2021-02-04 DIAGNOSIS — E1165 Type 2 diabetes mellitus with hyperglycemia: Secondary | ICD-10-CM | POA: Diagnosis present

## 2021-02-04 DIAGNOSIS — I2510 Atherosclerotic heart disease of native coronary artery without angina pectoris: Secondary | ICD-10-CM | POA: Diagnosis present

## 2021-02-04 DIAGNOSIS — Z794 Long term (current) use of insulin: Secondary | ICD-10-CM

## 2021-02-04 DIAGNOSIS — I5042 Chronic combined systolic (congestive) and diastolic (congestive) heart failure: Secondary | ICD-10-CM | POA: Diagnosis present

## 2021-02-04 LAB — PROCALCITONIN: Procalcitonin: <0.1 ng/mL

## 2021-02-04 LAB — CBC WITH DIFFERENTIAL/PLATELET
Abs Immature Granulocytes: 0.04 10*3/uL (ref 0.00–0.07)
Basophils Absolute: 0 10*3/uL (ref 0.0–0.1)
Basophils Relative: 0 %
Eosinophils Absolute: 0.2 10*3/uL (ref 0.0–0.5)
Eosinophils Relative: 2 %
HCT: 38.5 % — ABNORMAL LOW (ref 39.0–52.0)
Hemoglobin: 13.2 g/dL (ref 13.0–17.0)
Immature Granulocytes: 0 %
Lymphocytes Relative: 20 %
Lymphs Abs: 1.8 10*3/uL (ref 0.7–4.0)
MCH: 30.7 pg (ref 26.0–34.0)
MCHC: 34.3 g/dL (ref 30.0–36.0)
MCV: 89.5 fL (ref 80.0–100.0)
Monocytes Absolute: 0.8 10*3/uL (ref 0.1–1.0)
Monocytes Relative: 9 %
Neutro Abs: 6.1 10*3/uL (ref 1.7–7.7)
Neutrophils Relative %: 69 %
Platelets: 148 10*3/uL — ABNORMAL LOW (ref 150–400)
RBC: 4.3 MIL/uL (ref 4.22–5.81)
RDW: 13.7 % (ref 11.5–15.5)
WBC: 9 10*3/uL (ref 4.0–10.5)
nRBC: 0 % (ref 0.0–0.2)

## 2021-02-04 LAB — TSH: TSH: 0.627 u[IU]/mL (ref 0.350–4.500)

## 2021-02-04 LAB — COMPREHENSIVE METABOLIC PANEL
ALT: 34 U/L (ref 0–44)
AST: 22 U/L (ref 15–41)
Albumin: 2.8 g/dL — ABNORMAL LOW (ref 3.5–5.0)
Alkaline Phosphatase: 66 U/L (ref 38–126)
Anion gap: 10 (ref 5–15)
BUN: 16 mg/dL (ref 8–23)
CO2: 22 mmol/L (ref 22–32)
Calcium: 8.2 mg/dL — ABNORMAL LOW (ref 8.9–10.3)
Chloride: 99 mmol/L (ref 98–111)
Creatinine, Ser: 0.97 mg/dL (ref 0.61–1.24)
GFR, Estimated: >60 mL/min (ref >60)
Glucose, Bld: 223 mg/dL — ABNORMAL HIGH (ref 70–99)
Potassium: 3.8 mmol/L (ref 3.5–5.1)
Sodium: 131 mmol/L — ABNORMAL LOW (ref 135–145)
Total Bilirubin: 1.2 mg/dL (ref 0.3–1.2)
Total Protein: 6.5 g/dL (ref 6.5–8.1)

## 2021-02-04 LAB — TROPONIN I (HIGH SENSITIVITY)
Troponin I (High Sensitivity): 24 ng/L — ABNORMAL HIGH (ref <18)
Troponin I (High Sensitivity): 79 ng/L — ABNORMAL HIGH (ref <18)

## 2021-02-04 LAB — BRAIN NATRIURETIC PEPTIDE: B Natriuretic Peptide: 293.2 pg/mL — ABNORMAL HIGH (ref 0.0–100.0)

## 2021-02-04 LAB — GLUCOSE, CAPILLARY
Glucose-Capillary: 511 mg/dL (ref 70–99)
Glucose-Capillary: 355 mg/dL — ABNORMAL HIGH (ref 70–99)

## 2021-02-04 LAB — MAGNESIUM: Magnesium: 2 mg/dL (ref 1.7–2.4)

## 2021-02-04 MED ORDER — INSULIN ASPART 100 UNIT/ML ~~LOC~~ SOLN
0.0000 [IU] | Freq: Three times a day (TID) | SUBCUTANEOUS | Status: DC
  Administered 2021-02-04 (×2): 15 [IU] via SUBCUTANEOUS
  Administered 2021-02-05: 11 [IU] via SUBCUTANEOUS
  Administered 2021-02-05: 2 [IU] via SUBCUTANEOUS
  Administered 2021-02-05: 3 [IU] via SUBCUTANEOUS
  Administered 2021-02-06: 5 [IU] via SUBCUTANEOUS
  Administered 2021-02-06 – 2021-02-07 (×2): 8 [IU] via SUBCUTANEOUS
  Administered 2021-02-07: 5 [IU] via SUBCUTANEOUS
  Administered 2021-02-07: 3 [IU] via SUBCUTANEOUS
  Administered 2021-02-08: 11 [IU] via SUBCUTANEOUS
  Administered 2021-02-08 – 2021-02-09 (×2): 5 [IU] via SUBCUTANEOUS
  Filled 2021-02-04 (×12): qty 1

## 2021-02-04 MED ORDER — IPRATROPIUM-ALBUTEROL 0.5-2.5 (3) MG/3ML IN SOLN: 3.0000 mL | RESPIRATORY_TRACT | Status: DC | PRN

## 2021-02-04 MED ORDER — ASPIRIN EC 81 MG PO TBEC
81.0000 mg | DELAYED_RELEASE_TABLET | Freq: Every day | ORAL | Status: DC
  Administered 2021-02-04 – 2021-02-09 (×6): 81 mg via ORAL
  Filled 2021-02-04 (×6): qty 1

## 2021-02-04 MED ORDER — EZETIMIBE 10 MG PO TABS
10.0000 mg | ORAL_TABLET | Freq: Every day | ORAL | Status: DC
  Administered 2021-02-04 – 2021-02-08 (×5): 10 mg via ORAL
  Filled 2021-02-04 (×5): qty 1

## 2021-02-04 MED ORDER — AMLODIPINE BESYLATE 5 MG PO TABS
5.0000 mg | ORAL_TABLET | Freq: Every day | ORAL | Status: DC
  Administered 2021-02-04 – 2021-02-09 (×6): 5 mg via ORAL
  Filled 2021-02-04 (×6): qty 1

## 2021-02-04 MED ORDER — IPRATROPIUM-ALBUTEROL 0.5-2.5 (3) MG/3ML IN SOLN
3.0000 mL | Freq: Once | RESPIRATORY_TRACT | Status: AC
  Administered 2021-02-04: 3 mL via RESPIRATORY_TRACT

## 2021-02-04 MED ORDER — IOHEXOL 350 MG/ML SOLN
75.0000 mL | Freq: Once | INTRAVENOUS | Status: AC | PRN
  Administered 2021-02-04: 75 mL via INTRAVENOUS

## 2021-02-04 MED ORDER — BUSPIRONE HCL 5 MG PO TABS
5.0000 mg | ORAL_TABLET | Freq: Two times a day (BID) | ORAL | Status: DC
  Administered 2021-02-04 – 2021-02-07 (×7): 5 mg via ORAL
  Filled 2021-02-04 (×8): qty 1

## 2021-02-04 MED ORDER — ENOXAPARIN SODIUM 40 MG/0.4ML ~~LOC~~ SOLN
40.0000 mg | SUBCUTANEOUS | Status: DC
  Administered 2021-02-04 – 2021-02-09 (×6): 40 mg via SUBCUTANEOUS
  Filled 2021-02-04 (×7): qty 0.4

## 2021-02-04 MED ORDER — GUAIFENESIN-DM 100-10 MG/5ML PO SYRP
5.0000 mL | ORAL_SOLUTION | ORAL | Status: DC | PRN
  Administered 2021-02-04 – 2021-02-08 (×8): 5 mL via ORAL
  Filled 2021-02-04 (×8): qty 5

## 2021-02-04 MED ORDER — ALBUTEROL SULFATE (2.5 MG/3ML) 0.083% IN NEBU
2.5000 mg | INHALATION_SOLUTION | Freq: Three times a day (TID) | RESPIRATORY_TRACT | Status: DC | PRN
  Administered 2021-02-06 – 2021-02-07 (×3): 2.5 mg via RESPIRATORY_TRACT
  Filled 2021-02-04 (×4): qty 3

## 2021-02-04 MED ORDER — UMECLIDINIUM BROMIDE 62.5 MCG/INH IN AEPB
1.0000 | INHALATION_SPRAY | Freq: Every day | RESPIRATORY_TRACT | Status: DC
  Administered 2021-02-04: 1 via RESPIRATORY_TRACT
  Filled 2021-02-04: qty 7

## 2021-02-04 MED ORDER — METOPROLOL SUCCINATE ER 100 MG PO TB24
100.0000 mg | ORAL_TABLET | Freq: Every day | ORAL | Status: DC
  Administered 2021-02-04 – 2021-02-09 (×6): 100 mg via ORAL
  Filled 2021-02-04 (×7): qty 1

## 2021-02-04 MED ORDER — GLIPIZIDE ER 5 MG PO TB24
5.0000 mg | ORAL_TABLET | Freq: Every day | ORAL | Status: DC
  Administered 2021-02-05: 5 mg via ORAL
  Filled 2021-02-04: qty 1

## 2021-02-04 MED ORDER — BUDESON-GLYCOPYRROL-FORMOTEROL 160-9-4.8 MCG/ACT IN AERO: 1.0000 | INHALATION_SPRAY | Freq: Two times a day (BID) | RESPIRATORY_TRACT | Status: DC

## 2021-02-04 MED ORDER — IPRATROPIUM-ALBUTEROL 0.5-2.5 (3) MG/3ML IN SOLN: 3.0000 mL | Freq: Four times a day (QID) | RESPIRATORY_TRACT | Status: DC | PRN

## 2021-02-04 MED ORDER — GUAIFENESIN ER 600 MG PO TB12: 1200.0000 mg | ORAL_TABLET | Freq: Two times a day (BID) | ORAL | Status: DC | PRN

## 2021-02-04 MED ORDER — LOSARTAN POTASSIUM 50 MG PO TABS
100.0000 mg | ORAL_TABLET | Freq: Every day | ORAL | Status: DC
  Administered 2021-02-04 – 2021-02-09 (×6): 100 mg via ORAL
  Filled 2021-02-04 (×6): qty 2

## 2021-02-04 MED ORDER — INSULIN ASPART 100 UNIT/ML ~~LOC~~ SOLN: 0.0000 [IU] | Freq: Every day | SUBCUTANEOUS | Status: DC

## 2021-02-04 MED ORDER — FUROSEMIDE 40 MG PO TABS
40.0000 mg | ORAL_TABLET | Freq: Every day | ORAL | Status: DC
  Administered 2021-02-04: 40 mg via ORAL
  Filled 2021-02-04: qty 1

## 2021-02-04 MED ORDER — INSULIN GLARGINE 100 UNIT/ML ~~LOC~~ SOLN
10.0000 [IU] | Freq: Every day | SUBCUTANEOUS | Status: DC
  Administered 2021-02-04 – 2021-02-06 (×3): 10 [IU] via SUBCUTANEOUS
  Filled 2021-02-04 (×4): qty 0.1

## 2021-02-04 MED ORDER — METHYLPREDNISOLONE SODIUM SUCC 40 MG IJ SOLR: 40.0000 mg | Freq: Every day | INTRAMUSCULAR | Status: DC

## 2021-02-04 MED ORDER — ALPRAZOLAM 0.5 MG PO TABS
0.5000 mg | ORAL_TABLET | Freq: Two times a day (BID) | ORAL | Status: DC
  Administered 2021-02-04 – 2021-02-09 (×11): 0.5 mg via ORAL
  Filled 2021-02-04 (×11): qty 1

## 2021-02-04 MED ORDER — TRAZODONE HCL 50 MG PO TABS
50.0000 mg | ORAL_TABLET | Freq: Every evening | ORAL | Status: DC | PRN
  Administered 2021-02-04: 50 mg via ORAL
  Filled 2021-02-04: qty 1

## 2021-02-04 MED ORDER — REVEFENACIN 175 MCG/3ML IN SOLN
175.0000 ug | Freq: Every day | RESPIRATORY_TRACT | Status: DC
  Administered 2021-02-04 – 2021-02-09 (×6): 175 ug via RESPIRATORY_TRACT
  Filled 2021-02-04 (×6): qty 3

## 2021-02-04 MED ORDER — TRAZODONE HCL 50 MG PO TABS: 50.0000 mg | ORAL_TABLET | Freq: Every day | ORAL | Status: DC

## 2021-02-04 MED ORDER — MOMETASONE FURO-FORMOTEROL FUM 100-5 MCG/ACT IN AERO
2.0000 | INHALATION_SPRAY | Freq: Two times a day (BID) | RESPIRATORY_TRACT | Status: DC
  Administered 2021-02-04: 2 via RESPIRATORY_TRACT
  Filled 2021-02-04: qty 8.8

## 2021-02-04 MED ORDER — ONDANSETRON HCL 4 MG/2ML IJ SOLN: 4.0000 mg | Freq: Four times a day (QID) | INTRAMUSCULAR | Status: DC | PRN

## 2021-02-04 MED ORDER — MELATONIN 5 MG PO TABS
10.0000 mg | ORAL_TABLET | Freq: Every day | ORAL | Status: DC
  Administered 2021-02-04 – 2021-02-08 (×4): 10 mg via ORAL
  Filled 2021-02-04 (×4): qty 2

## 2021-02-04 MED ORDER — IPRATROPIUM-ALBUTEROL 0.5-2.5 (3) MG/3ML IN SOLN
3.0000 mL | Freq: Once | RESPIRATORY_TRACT | Status: AC
  Administered 2021-02-04: 3 mL via RESPIRATORY_TRACT
  Filled 2021-02-04: qty 6

## 2021-02-04 MED ORDER — PANTOPRAZOLE SODIUM 40 MG PO TBEC
40.0000 mg | DELAYED_RELEASE_TABLET | Freq: Every day | ORAL | Status: DC
  Administered 2021-02-04 – 2021-02-09 (×6): 40 mg via ORAL
  Filled 2021-02-04 (×6): qty 1

## 2021-02-04 MED ORDER — VITAMIN D 25 MCG (1000 UNIT) PO TABS
1000.0000 [IU] | ORAL_TABLET | ORAL | Status: DC
  Administered 2021-02-04 – 2021-02-09 (×6): 1000 [IU] via ORAL
  Filled 2021-02-04 (×6): qty 1

## 2021-02-04 MED ORDER — FOLIC ACID 1 MG PO TABS
500.0000 ug | ORAL_TABLET | Freq: Every evening | ORAL | Status: DC
  Administered 2021-02-04 – 2021-02-08 (×5): 0.5 mg via ORAL
  Filled 2021-02-04 (×5): qty 1

## 2021-02-04 MED ORDER — ACETAMINOPHEN 650 MG RE SUPP: 650.0000 mg | Freq: Four times a day (QID) | RECTAL | Status: DC | PRN

## 2021-02-04 MED ORDER — LIDOCAINE 5 % EX PTCH
1.0000 | MEDICATED_PATCH | CUTANEOUS | Status: DC
  Administered 2021-02-04 – 2021-02-09 (×5): 1 via TRANSDERMAL
  Filled 2021-02-04 (×8): qty 1

## 2021-02-04 MED ORDER — REVEFENACIN 175 MCG/3ML IN SOLN
175.0000 ug | Freq: Every day | RESPIRATORY_TRACT | Status: DC
  Filled 2021-02-04: qty 3

## 2021-02-04 MED ORDER — ONDANSETRON HCL 4 MG PO TABS: 4.0000 mg | ORAL_TABLET | Freq: Four times a day (QID) | ORAL | Status: DC | PRN

## 2021-02-04 MED ORDER — ARFORMOTEROL TARTRATE 15 MCG/2ML IN NEBU
15.0000 ug | INHALATION_SOLUTION | Freq: Two times a day (BID) | RESPIRATORY_TRACT | Status: DC
  Administered 2021-02-04 – 2021-02-09 (×10): 15 ug via RESPIRATORY_TRACT
  Filled 2021-02-04 (×11): qty 2

## 2021-02-04 MED ORDER — ACETAMINOPHEN 325 MG PO TABS
650.0000 mg | ORAL_TABLET | Freq: Four times a day (QID) | ORAL | Status: DC | PRN
  Administered 2021-02-06 – 2021-02-09 (×4): 650 mg via ORAL
  Filled 2021-02-04 (×4): qty 2

## 2021-02-04 MED ORDER — BUDESONIDE 0.5 MG/2ML IN SUSP
0.5000 mg | Freq: Two times a day (BID) | RESPIRATORY_TRACT | Status: DC
  Administered 2021-02-04 – 2021-02-09 (×10): 0.5 mg via RESPIRATORY_TRACT
  Filled 2021-02-04 (×10): qty 2

## 2021-02-04 MED ORDER — SUCRALFATE 1 GM/10ML PO SUSP
1.0000 g | Freq: Three times a day (TID) | ORAL | Status: DC
  Administered 2021-02-04 – 2021-02-09 (×20): 1 g via ORAL
  Filled 2021-02-04 (×24): qty 10

## 2021-02-04 NOTE — ED Notes (Signed)
RRT notified for bipap.

## 2021-02-04 NOTE — Consult Note (Signed)
Reason for Consult: Persistent shortness of breath after COVID-19 pneumonia Referring Physician: Fidela Juneau, MD  Harold Parker is an 75 y.o. male.  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  He has been on oxygen at 2 L/min chronically.  Initially started using oxygen at nighttime but most recently had been on 24/7.  To [facility] from 01/11/2021 through 01/24/2021.  At that time he was noted to have acute hypoxemic respiratory failure due to COVID-19 he was discharged home 4 L/min nasal cannula O2.  He notes that he has desaturations to the mid 80s on activity on this flow rate.  Patient was admitted earlier today and noted to have been tachycardic at the time of admission with oxygen saturations at 87% on 4 L/min.  He was placed on nonrebreather at 10 L with saturations in the mid 90s.  He was noted to have persistent tachypnea and increased work of breathing and was subsequently placed on BiPAP.  He has been weaned off BiPAP at the time of this evaluation.  Chest CT has been obtained which showed no PE.  Scattered lung nodules some with air bronchograms.  May represent areas of organizing pneumonia.   Past Medical History:  Diagnosis Date  . Bilateral carotid bruits    a. 01/2018 U/S: < 50% bilat ICA stenosis.  Marland Kitchen CAD (coronary artery disease)    a. 1998 s/p MI and BMS [facility] [place] [place]); b. 1999 redo PCI/rotablator in setting of what sounds like ISR;  c. Multiple stress tests over the years - last ~ 2017, reportedly nl; d. 01/2018 NSTEMI/Cath: LM 60m/d, LAD 50p, 40p/m, D1 60ost, OM1 95, RCA 100ost/p w/ L->R collats, EF 45%; e. s/p 3V CABG 02/19/18 (LIMA-LAD, VG-D1, VG-OM)  . Chronic lower back pain   . COPD (chronic obstructive pulmonary disease) (HCC)   . GIB (gastrointestinal bleeding)    a. 02/2018 GIB and anemia w/ Hgb of 4.7 on presentation; b. 03/2018  EGD: 2 nonbleeding duodenal ulcers.  Marland Kitchen HTN (hypertension)   . Hypercholesteremia   . Ischemic cardiomyopathy    a. 01/2018 Echo: EF 40-45%, mid-apicalanteroseptal, ant, apical sev HK, mod apicalinferior HK. Gr2 DD. Mod AS, mild MR, mod dil LA, PASP .  . Moderate aortic stenosis    a. 01/2018 Echo: Mod AS, mean grad (S) , Valve area (VTI) 1.06 cm^2, (Vmax) 1.27 cm^2; b. 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  . Myocardial infarction (HCC) ~ 1998/1999  . 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 02/19/2018 AVR: 25 mm Edwards Inspiris Resilia stented bovine pericardial tissue valve  . 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 Tobacco abuse   . Type II diabetes mellitus (HCC)     Past Surgical History:  Procedure Laterality Date  . ABDOMINAL AORTOGRAM N/A 03/04/2019   Procedure: ABDOMINAL AORTOGRAM;  Surgeon: Iran Ouch, MD;  [facility];  Service: Cardiovascular;  Laterality: N/A;  . AORTIC VALVE REPLACEMENT N/A 02/19/2018   Procedure: AORTIC VALVE REPLACEMENT (AVR);  Surgeon: Purcell Nails, MD;  Location: [facility] OR;  Service: Open Heart Surgery;  Laterality: N/A;  . COLONOSCOPY    . CORONARY ANGIOPLASTY WITH STENT PLACEMENT  ~ 1998/1999  . CORONARY ARTERY BYPASS GRAFT N/A 02/19/2018   Procedure: CORONARY ARTERY  BYPASS GRAFTING (CABG) x three , using left internal mammary artery and right leg greater saphenous vein harvested endoscopically;  Surgeon: Purcell Nailswen, Clarence H, MD;  [facility];  Service: Open Heart Surgery;  Laterality: N/A;  . ESOPHAGOGASTRODUODENOSCOPY (EGD) WITH PROPOFOL N/A 03/18/2018   Procedure: ESOPHAGOGASTRODUODENOSCOPY (EGD) WITH PROPOFOL;  Surgeon: Midge MiniumWohl, Darren, MD;  Location: [facility] ENDOSCOPY;  Service: Endoscopy;  Laterality: N/A;  . LEFT HEART CATH AND CORONARY ANGIOGRAPHY N/A 02/06/2018   Procedure: LEFT HEART CATH AND CORONARY ANGIOGRAPHY;  Surgeon: Iran OuchArida, Muhammad A, MD;   Location: [facility] INVASIVE CV LAB;  Service: Cardiovascular;  Laterality: N/A;  . LOWER EXTREMITY ANGIOGRAPHY Bilateral 03/04/2019   Procedure: Lower Extremity Angiography;  Surgeon: Iran OuchArida, Muhammad A, MD;  [facility]: [facility];  Service: Cardiovascular;  Laterality: Bilateral;  . PERIPHERAL VASCULAR ATHERECTOMY Left 03/04/2019   Procedure: PERIPHERAL VASCULAR ATHERECTOMY;  Surgeon: Iran OuchArida, Muhammad A, MD;  [facility]: [facility];  Service: Cardiovascular;  Laterality: Left;  SFA  . TEE WITHOUT CARDIOVERSION N/A 02/19/2018   Procedure: TRANSESOPHAGEAL ECHOCARDIOGRAM (TEE);  Surgeon: Purcell Nailswen, Clarence H, MD;  [facility];  Service: Open Heart Surgery;  Laterality: N/A;  . TONSILLECTOMY      Family History  Problem Relation Age of Onset  . Lymphoma Mother   . Peripheral vascular disease Father     Social History   Tobacco Use  . Smoking status: Former Smoker    Packs/day: 1.00    Years: 40.00    Pack years: 40.00    Types: Cigarettes    Quit date: 02/02/2018    Years since quitting: 3.0  . Smokeless tobacco: Never Used  . Tobacco comment: Quit 01/2018 - on 05/01/2018 talked to Rayna SextonRalph about Relaspe concerns. He ststaes that he has no taste for tobacco since he quit in Feb.  Substance Use Topics  . Alcohol use: Yes    Comment: occ     Allergies:  Allergies  Allergen Reactions  . Atorvastatin Other (See Comments)    Leg aches and weakness     Medications:  Prior to Admission:  Medications Prior to Admission  Medication Sig Dispense Refill Last Dose  . acetaminophen (TYLENOL) 500 MG tablet Take 1,000 mg by mouth daily as needed for moderate pain or headache.   prn at prn  . ALPRAZolam (XANAX) 0.5 MG tablet Take 0.5 mg by mouth 2 (two) times daily.   02/03/2021 at 1900  . amLODipine (NORVASC) 5 MG tablet Take 5 mg by mouth daily.   02/03/2021 at 0900  . Ascorbic Acid (VITAMIN C) 1000 MG tablet Take 1,000 mg by mouth daily.   02/03/2021 at 0900  . aspirin EC 81 MG tablet  Take 81 mg by mouth daily.   02/03/2021 at 0900  . BREZTRI AEROSPHERE 160-9-4.8 MCG/ACT AERO TAKE 2 PUFFS BY MOUTH TWICE A DAY (STOP TRELEGY) 32.1 g 3 02/02/2021 at 2200  . busPIRone (BUSPAR) 5 MG tablet Take 5 mg by mouth 2 (two) times daily.   02/03/2021 at 2100  . dextromethorphan-guaiFENesin (GILTUSS DIABETIC COUGH & COLD) 10-100 MG/5ML liquid Take 30 mLs by mouth every 4 (four) hours as needed for cough.   prn at prn  . esomeprazole (NEXIUM) 40 MG capsule Take 40 mg by mouth at bedtime.    02/03/2021 at 2200  . ezetimibe (ZETIA) 10 MG tablet Take 10 mg by mouth at bedtime.    02/03/2021 at 2200  . folic acid (FOLVITE) 400 MCG tablet Take 400 mcg by mouth every  evening.   02/03/2021 at 1900  . furosemide (LASIX) 40 MG tablet TAKE 1 TABLET BY MOUTH EVERY DAY 90 tablet 3 02/03/2021 at 0900  . glipiZIDE (GLUCOTROL) 5 MG tablet Take 0.5 tablets (2.5 mg total) by mouth daily before breakfast. (Patient taking differently: Take 5 mg by mouth See admin instructions. Take 5 mg in the morning and take a second 5 mg dose at night on Mon, Wed, and Fri) 30 tablet 1 02/03/2021 at 0900  . guaiFENesin (MUCINEX) 600 MG 12 hr tablet Take 1,200 mg by mouth every 12 (twelve) hours as needed.   prn at prn  . ipratropium-albuterol (DUONEB) 0.5-2.5 (3) MG/3ML SOLN Take 3 mLs by nebulization every 6 (six) hours as needed. (Patient taking differently: Take 3 mLs by nebulization every 6 (six) hours as needed (shortness of breath).) 360 mL 1 prn at prn  . losartan (COZAAR) 100 MG tablet Take 100 mg by mouth daily.   02/03/2021 at 0900  . Melatonin 10 MG CAPS Take 10 mg by mouth at bedtime.   02/03/2021 at 2200  . metFORMIN (GLUCOPHAGE) 1000 MG tablet TAKE 1 TABLET BY MOUTH TWICE DAILY WITH A MEAL (Patient taking differently: Take 1,000 mg by mouth 2 (two) times daily.) 60 tablet 0 02/03/2021 at 1900  . metoprolol succinate (TOPROL-XL) 100 MG 24 hr tablet TAKE 1 TABLET BY MOUTH EVERY DAY WITH OR IMMEDIATELY FOLLOWING A MEAL 90 tablet 1  02/03/2021 at 0900  . PROAIR HFA 108 (90 Base) MCG/ACT inhaler Inhale 1 puff into the lungs every 4 (four) hours as needed for wheezing or shortness of breath.   prn at prn  . sucralfate (CARAFATE) 1 GM/10ML suspension Take 10 mLs (1 g total) by mouth 4 (four) times daily -  with meals and at bedtime. 420 mL 0 02/03/2021 at 1900  . Vitamin D, Cholecalciferol, 1000 units TABS Take 1,000 Units by mouth every morning.    02/03/2021 at 0900    Results for orders placed or performed during the hospital encounter of 02/04/21 (from the past 48 hour(s))  Brain natriuretic peptide     Status: Abnormal   Collection Time: 02/04/21  4:06 AM  Result Value Ref Range   B Natriuretic Peptide 293.2 (H) 0.0 - 100.0 pg/mL    Comment: Performed at [facility], [address] [place], [place] [address]  Troponin I (High Sensitivity)     Status: Abnormal   Collection Time: 02/04/21  4:06 AM  Result Value Ref Range   Troponin I (High Sensitivity) 24 (H) <18 ng/L    Comment: (NOTE) Elevated high sensitivity troponin I (hsTnI) values and significant  changes across serial measurements may suggest ACS but many other  chronic and acute conditions are known to elevate hsTnI results.  Refer to the "Links" section for chest pain algorithms and additional  guidance. Performed at [facility], [address] [place], [place] [address]   CBC with Differential     Status: Abnormal   Collection Time: 02/04/21  4:06 AM  Result Value Ref Range   WBC 9.0 4.0 - 10.5 K/uL   RBC 4.30 4.22 - 5.81 MIL/uL   Hemoglobin 13.2 13.0 - 17.0 g/dL   HCT 63.8 (L) 93.7 - 34.2 %   MCV 89.5 80.0 - 100.0 fL   MCH 30.7 26.0 - 34.0 pg   MCHC 34.3 30.0 - 36.0 g/dL   RDW 87.6 81.1 - 57.2 %   Platelets 148 (L) 150 - 400 K/uL  nRBC 0.0 0.0 - 0.2 %   Neutrophils Relative % 69 %   Neutro Abs 6.1 1.7 - 7.7 K/uL   Lymphocytes Relative 20 %   Lymphs Abs 1.8 0.7 - 4.0 K/uL   Monocytes Relative 9 %   Monocytes Absolute  0.8 0.1 - 1.0 K/uL   Eosinophils Relative 2 %   Eosinophils Absolute 0.2 0.0 - 0.5 K/uL   Basophils Relative 0 %   Basophils Absolute 0.0 0.0 - 0.1 K/uL   Immature Granulocytes 0 %   Abs Immature Granulocytes 0.04 0.00 - 0.07 K/uL    Comment: Performed at [facility], [address] [place], [place] [address]  Comprehensive metabolic panel     Status: Abnormal   Collection Time: 02/04/21  4:06 AM  Result Value Ref Range   Sodium 131 (L) 135 - 145 mmol/L   Potassium 3.8 3.5 - 5.1 mmol/L   Chloride 99 98 - 111 mmol/L   CO2 22 22 - 32 mmol/L   Glucose, Bld 223 (H) 70 - 99 mg/dL    Comment: Glucose reference range applies only to samples taken after fasting for at least 8 hours.   BUN 16 8 - 23 mg/dL   Creatinine, Ser 5.68 0.61 - 1.24 mg/dL   Calcium 8.2 (L) 8.9 - 10.3 mg/dL   Total Protein 6.5 6.5 - 8.1 g/dL   Albumin 2.8 (L) 3.5 - 5.0 g/dL   AST 22 15 - 41 U/L   ALT 34 0 - 44 U/L   Alkaline Phosphatase 66 38 - 126 U/L   Total Bilirubin 1.2 0.3 - 1.2 mg/dL   GFR, Estimated >12 >75 mL/min    Comment: (NOTE) Calculated using the CKD-EPI Creatinine Equation (2021)    Anion gap 10 5 - 15    Comment: Performed at [facility], [address] [place], [place] [address]  Procalcitonin - Baseline     Status: None   Collection Time: 02/04/21  4:06 AM  Result Value Ref Range   Procalcitonin <0.10 ng/mL    Comment:        Interpretation: PCT (Procalcitonin) <= 0.5 ng/mL: Systemic infection (sepsis) is not likely. Local bacterial infection is possible. (NOTE)       Sepsis PCT Algorithm           Lower Respiratory Tract                                      Infection PCT Algorithm    ----------------------------     ----------------------------         PCT < 0.25 ng/mL                PCT < 0.10 ng/mL          Strongly encourage             Strongly discourage   discontinuation of antibiotics    initiation of antibiotics    ----------------------------      -----------------------------       PCT 0.25 - 0.50 ng/mL            PCT 0.10 - 0.25 ng/mL               OR       >80% decrease in PCT            Discourage initiation of  antibiotics      Encourage discontinuation           of antibiotics    ----------------------------     -----------------------------         PCT >= 0.50 ng/mL              PCT 0.26 - 0.50 ng/mL               AND        <80% decrease in PCT             Encourage initiation of                                             antibiotics       Encourage continuation           of antibiotics    ----------------------------     -----------------------------        PCT >= 0.50 ng/mL                  PCT > 0.50 ng/mL               AND         increase in PCT                  Strongly encourage                                      initiation of antibiotics    Strongly encourage escalation           of antibiotics                                     -----------------------------                                           PCT <= 0.25 ng/mL                                                 OR                                        > 80% decrease in PCT                                      Discontinue / Do not initiate                                             antibiotics  Performed at [facility], [address] [place], [place] [address]   Troponin I (High Sensitivity)     Status: Abnormal   Collection  Time: 02/04/21  9:35 AM  Result Value Ref Range   Troponin I (High Sensitivity) 79 (H) <18 ng/L    Comment: READ BACK AND VERIFIED WITH JESSICA CHRISTMAS 02/04/21 1026 [facility] (NOTE) Elevated high sensitivity troponin I (hsTnI) values and significant  changes across serial measurements may suggest ACS but many other  chronic and acute conditions are known to elevate hsTnI results.  Refer to the "Links" section for chest pain algorithms and additional  guidance. Performed  at [facility], [address] [place], [place] [address]   Glucose, capillary     Status: Abnormal   Collection Time: 02/04/21 12:40 PM  Result Value Ref Range   Glucose-Capillary 511 (HH) 70 - 99 mg/dL    Comment: Glucose reference range applies only to samples taken after fasting for at least 8 hours.   Comment 1 Notify RN   TSH     Status: None   Collection Time: 02/04/21  1:05 PM  Result Value Ref Range   TSH 0.627 0.350 - 4.500 uIU/mL    Comment: Performed by a 3rd Generation assay with a functional sensitivity of <=0.01 uIU/mL. Performed at [facility], [address] [place], [place] [address]   Magnesium     Status: None   Collection Time: 02/04/21  1:05 PM  Result Value Ref Range   Magnesium 2.0 1.7 - 2.4 mg/dL    Comment: Performed at [facility], [address] [place], [place] [address]    CT ANGIO CHEST PE W OR WO CONTRAST  Result Date: 02/04/2021 CLINICAL DATA:  Rule out pulmonary embolus.  High probability. EXAM: CT ANGIOGRAPHY CHEST WITH CONTRAST TECHNIQUE: Multidetector CT imaging of the chest was performed using the standard protocol during bolus administration of intravenous contrast. Multiplanar CT image reconstructions and MIPs were obtained to evaluate the vascular anatomy. CONTRAST:  10mL OMNIPAQUE IOHEXOL 350 MG/ML SOLN COMPARISON:  02/25/2019 FINDINGS: Cardiovascular: Mild scratch set heart size upper limits of normal. Previous median sternotomy and CABG procedure. Aortic atherosclerosis. The main pulmonary artery is patent. No lobar or segmental pulmonary artery filling defects. Mediastinum/Nodes: No enlarged axillary or supraclavicular lymph nodes. Prominent mediastinal lymph nodes are identified including 1.1 cm low right paratracheal lymph node, image 40/4. This is unchanged from previous exam. Left pre-vascular node measures 1.3 cm, image 38/4. Previously 1.1 cm. Subcarinal node measures 1.2 cm, image 48/4. Previously 0.8 cm.  Prominent bilateral hilar lymphoid none of which meet CT criteria for adenopathy. Lungs/Pleura: Moderate changes of centrilobular emphysema. Patchy area of atelectasis and airspace density is noted within the posterior left lung base. Mild subsegmental atelectasis is noted in the subpleural right lung base. Patchy irregular nodular density within the superior segment of the left lower lobe measures 1.9 cm, image 47/6. New compared with 02/25/2019. Adjacent part solid nodule measures 1 cm, image 53/6. New compared with 02/25/2019. Small round solid nodule in the superior segment of right lower lobe measures 6 mm, image 50/6. New from previous exam. Within the right upper lobe there is a 5 mm lung nodule, image 28/6. New from previous exam. Upper Abdomen: Bilateral kidney cysts are again noted. The largest arises from the upper pole of right kidney measuring 5.5 cm, image 90/4. Aortic atherosclerosis noted. Prominent gastrohepatic ligament node measures 1.2 cm, image 90/4. Musculoskeletal: No acute osseous findings mild multilevel degenerative disc disease noted within the lower thoracic spine. Review of the MIP images confirms the above findings. IMPRESSION: 1. No evidence for acute pulmonary embolus. 2.  Patchy area of atelectasis and airspace density is noted within the posterior left lung base. Findings are nonspecific but are favored to reflect inflammatory/infectious process. 3. Scattered lung nodules, new from previous exam. The dominant nodule is in the superior segment of left lower lobe measuring 1.9 cm. Non-contrast chest CT at 3-6 months is recommended. If the nodules are stable at time of repeat CT, then future CT at 18-24 months (from today's scan) is considered optional for low-risk patients, but is recommended for high-risk patients. This recommendation follows the consensus statement: Guidelines for Management of Incidental Pulmonary Nodules Detected on CT Images: From the Fleischner Society 2017;  Radiology 2017; 284:228-243. 4. Prominent mediastinal and hilar lymph nodes are again noted. These are nonspecific in the setting of pneumonia and may be reactive in etiology. Attention on follow-up imaging is advised. Aortic Atherosclerosis (ICD10-I70.0) and Emphysema (ICD10-J43.9). Electronically Signed   By: Signa Kell M.D.   On: 02/04/2021 06:54   DG Chest Portable 1 View  Result Date: 02/04/2021 CLINICAL DATA:  Shortness of breath and cough. EXAM: PORTABLE CHEST 1 VIEW COMPARISON:  01/21/2021 FINDINGS: Interstitial coarsening which appears bronchitic based on previous imaging. There is no edema, discrete consolidation, effusion, or pneumothorax. Normal heart size. Prior CABG and aortic valve replacement. IMPRESSION: Bronchitic markings without acute superimposed finding. Electronically Signed   By: Marnee Spring M.D.   On: 02/04/2021 04:45    Review of Systems  A 10 point review of systems was performed and it is as noted above otherwise negative.  Blood pressure (!) 124/55, pulse (!) 102, temperature 97.7 F (36.5 C), resp. rate 19, height 5\' 6"  (1.676 m), weight 72.5 kg, SpO2 94 %.   Physical Exam GENERAL: Chronically ill-appearing man, well nourished, well developed.  Hoarse, this is chronic.  No conversational dyspnea.  Comfortable with nasal cannula O2. HEAD: Normocephalic, atraumatic.  EYES: Pupils equal, round, reactive to light.  No scleral icterus.  MOUTH: Oral mucosa moist.  No thrush. NECK: Supple. No thyromegaly. Trachea midline. No JVD.  No adenopathy. PULMONARY: Distant breath sounds.  Coarse breath sounds with no other adventitious sounds. CARDIOVASCULAR: S1 and S2. Regular rate and rhythm.  1/6 systolic murmur at the lower left sternal border.  Unchanged to my recollection. ABDOMEN: Benign. MUSCULOSKELETAL: No joint deformity, no clubbing, no edema.  NEUROLOGIC: No overt focal deficit, speech is fluent. SKIN: Intact,warm,dry. PSYCH: Anxious.  Representative  imaging as below, independently reviewed:        Assessment/Plan:  Dyspnea post COVID-19 Hx: COPD with chronic respiratory failure No PE noted on chest CT This will take time to recovery Patient may benefit from inpatient rehab Oxygen to maintain saturations at 88% or better Dyspnea and persistent hypoxia is most common post COVID-19 issue Recheck 2D echo to ensure no impact from recent COVID-19 infection  COPD without acute exacerbation Will optimize bronchodilator therapy Brovana twice a day via nebulizer Pulmicort 0.5 mg twice a day via nebulizer Yupelri once daily via nebulizer As needed albuterol  Ischemic cardiomyopathy This issue adds complexity to his management Check 2D echo as above Appreciate cardiology input  Aortic valve disease Status post aortic valve replacement 2019   Nodular-like infiltrates Likely sequela from COVID-19 May represent patches of organizing pneumonia Follow-up with CT scan in 6 to 8 weeks  Appreciate the opportunity to allow me to participate in this gentleman's care.  Above discussed with Dr. 21 May via secure chat.  Renae Gloss, MD [facility] 02/04/2021, 1:56 PM   *This note  was dictated using voice recognition software/Dragon.  Despite best efforts to proofread, errors can occur which can change the meaning.  Any change was purely unintentional.

## 2021-02-04 NOTE — Progress Notes (Addendum)
FSBS 511- MD made aware/ orders to hold iv steroids/ will monitor.

## 2021-02-04 NOTE — Consult Note (Signed)
Cardiology Consultation:   Patient ID: Harold Parker MRN: [id number]; DOB: Feb 08, 1946  Admit date: 02/04/2021 Date of Consult: 02/04/2021  PCP:  Merlene Laughter, MD   [facility]  Cardiologist:  Harold Bears, MD  Advanced Practice Provider:  No care team member to display Electrophysiologist:  None 46}    Patient Profile:   Harold Parker is a 75 y.o. male with a hx of HFrEF/ICM (09/2020 EF 45-50%), CAD s/p CABG 02/2018, aortic stenosis s/p AVR 02/2018, postop A. fib 02/2018 controlled with amiodarone, GIB/duodenal ulcers while on DAPT, PAD s/p 02/2019 angiography and intervention to LSFA, hypertension, hyperlipidemia, DM2, COPD, previous tobacco abuse (quit 3 years ago), and who is being seen today for the evaluation of acute on chronic heart failure and elevated HS Tn at the request of Dr. Renae Gloss.  History of Present Illness:   [name] is a 75 year old male with PMH as above.  He has a previous history of smoking.  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  He enjoys riding his motorcycle and working in the yard. He has a history of CAD s/p CABG.  He suffered 01/2018 non-STEMI with LHC showing severe LM stenosis and significant disease in OM1 and RCA.  EF 40 to 45%, moderate AS.  He underwent CABG and AVR 02/2018.  Postop A. fib was controlled with amiodarone.  Discharged home on DAPT with ASA and Plavix, subsequently discontinued due to GIB with EGD showing duodenal ulcers. ASA resumed later and once felt safe to do so. Last echo as below showed EF 45-50% and functioning aortic bioprosthetic valve.   Last seen in the office 09/2020 by Dr. Kirke Corin, at which time he reported mild bilateral calf claudication and exertional dyspnea with underlying COPD.  He was using oxygen intermittently for exertional dyspnea.  He also had occasional twinges in his chest, lasting for few seconds.  No tightness.  He was noted to be euvolemic on his  current dose of furosemide.  No changes were made to medications; however, it was noted that future considerations could include switching to [facility] in the future.    He was admitted 01/12/2020 for 2 weeks and discharged 10 days for respiratory failure in the setting of COVID-19 pneumonia.  He was discharged home on 4 L nasal cannula oxygen. Since then, he has not noticed any improvement in SOB and reports a heavy reliance on his nasal cannula oxygen.  He reports significant anxiety, and he wonders the extent to which this may be contributing to his symptoms.  He has a cough with occasional brown sputum.  He reports chest tightness from left to right that he states he was told may be associated with his CABG and associated paresthesias.  He reports exertional dyspnea, noted when he leaves his chair to go to the restroom, which she states is only a few feet.  No racing heart rate or palpitations.  He does occasionally have dizziness when standing too quickly.  No LOC.  He has not noticed any swelling or abdominal distention.  He does report a significant lack of sleep.  He reports 1 to 2 hours of sleep since his last discharge.  He has been spending his nights in his recliner (not due to orthopnea, but rather insomnia, watching television.   He decided to present to [facility] ED 2/19 after his increasing shortness of breath became so severe that he decided to call 911 after a discussion with his fiance  regarding his symptoms.  Per EMR, he was hypoxic on 4 L oxygen when EMS arrived.  He received 4 breathing treatments, Solu-Medrol, and magnesium in route. In the ED, initial vitals significant for 112/96, 127 bpm, 87% SPO2.  Labs showed Cr stable, BNP 293.2, platelets 148, hematocrit 38.5, sodium 131, glucose 223, calcium 8.2, albumin 2.8, high-sensitivity troponin 24  79.  EKG showed sinus tachycardia, 128 bpm, RBBB, LPFB.  Chest x-ray showed no acute changes from previous.  Subsequent CTA without PE, nonspecific  findings thought 2/2 inflammatory/infectious process, scattered lung nodules (dominant in LLL at 1.9 cm, recommendation for noncontrast CT at 3 to 6 mo), prominent previously noted hilar lymph nodes, emphysema, and aortic atherosclerosis.    He was placed on BiPAP with ongoing DuoNebs.   Cardiology consulted 2/19. At the time of cardiology consultation, he was on nasal cannula oxygen.  He reported significant improvement in his anxiety.  Past Medical History:  Diagnosis Date  . Bilateral carotid bruits    a. 01/2018 U/S: < 50% bilat ICA stenosis.  Marland Kitchen CAD (coronary artery disease)    a. 1998 s/p MI and BMS [facility] [place]); b. 1999 redo PCI/rotablator in setting of what sounds like ISR;  c. Multiple stress tests over the years - last ~ 2017, reportedly nl; d. 01/2018 NSTEMI/Cath: LM 12m/d, LAD 50p, 40p/m, D1 60ost, OM1 95, RCA 100ost/p w/ L->R collats, EF 45%; e. s/p 3V CABG 02/19/18 (LIMA-LAD, VG-D1, VG-OM)  . Chronic lower back pain   . COPD (chronic obstructive pulmonary disease) (HCC)   . GIB (gastrointestinal bleeding)    a. 02/2018 GIB and anemia w/ Hgb of 4.7 on presentation; b. 03/2018 EGD: 2 nonbleeding duodenal ulcers.  Marland Kitchen HTN (hypertension)   . Hypercholesteremia   . Ischemic cardiomyopathy    a. 01/2018 Echo: EF 40-45%, mid-apicalanteroseptal, ant, apical sev HK, mod apicalinferior HK. Gr2 DD. Mod AS, mild MR, mod dil LA, PASP .  . Moderate aortic stenosis    a. 01/2018 Echo: Mod AS, mean grad (S) , Valve area (VTI) 1.06 cm^2, (Vmax) 1.27 cm^2; b. 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  . Myocardial infarction (HCC) ~ 1998/1999  . 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 02/19/2018 AVR: 25 mm Edwards Inspiris Resilia stented bovine pericardial tissue valve  . 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 Tobacco abuse   . Type II diabetes mellitus (HCC)     Past Surgical History:  Procedure Laterality Date   . ABDOMINAL AORTOGRAM N/A 03/04/2019   Procedure: ABDOMINAL AORTOGRAM;  Surgeon: Iran Ouch, MD;  Location: [facility] INVASIVE CV LAB;  Service: Cardiovascular;  Laterality: N/A;  . AORTIC VALVE REPLACEMENT N/A 02/19/2018   Procedure: AORTIC VALVE REPLACEMENT (AVR);  Surgeon: Purcell Nails, MD;  Location[facility] OR;  Service: Open Heart Surgery;  Laterality: N/A;  . COLONOSCOPY    . CORONARY ANGIOPLASTY WITH STENT PLACEMENT  ~ 1998/1999  . CORONARY ARTERY BYPASS GRAFT N/A 02/19/2018   Procedure: CORONARY ARTERY BYPASS GRAFTING (CABG) x three , using left internal mammary artery and right leg greater saphenous vein harvested endoscopically;  Surgeon: Purcell Nails, MD;  Location: [facility] OR;  Service: Open Heart Surgery;  Laterality: N/A;  . ESOPHAGOGASTRODUODENOSCOPY (EGD) WITH PROPOFOL N/A 03/18/2018   Procedure: ESOPHAGOGASTRODUODENOSCOPY (EGD) WITH PROPOFOL;  Surgeon: Midge Minium, MD;  Location: [facility] ENDOSCOPY;  Service: Endoscopy;  Laterality: N/A;  . LEFT  HEART CATH AND CORONARY ANGIOGRAPHY N/A 02/06/2018   Procedure: LEFT HEART CATH AND CORONARY ANGIOGRAPHY;  Surgeon: Iran Ouch, MD;  Location: [facility] INVASIVE CV LAB;  Service: Cardiovascular;  Laterality: N/A;  . LOWER EXTREMITY ANGIOGRAPHY Bilateral 03/04/2019   Procedure: Lower Extremity Angiography;  Surgeon: Iran Ouch, MD;  Location: [facility]VASIVE CV LAB;  Service: Cardiovascular;  Laterality: Bilateral;  . PERIPHERAL VASCULAR ATHERECTOMY Left 03/04/2019   Procedure: PERIPHERAL VASCULAR ATHERECTOMY;  Surgeon: Iran Ouch, MD;  Location: [facility]AS[facility] CV LAB;  Service: Cardiovascular;  Laterality: Left;  SFA  . TEE WITHOUT CARDIOVERSION N/A 02/19/2018   Procedure: TRANSESOPHAGEAL ECHOCARDIOGRAM (TEE);  Surgeon: Purcell Nails, MD;  Location: [facility] OR;  Service: Open Heart Surgery;  Laterality: N/A;  . TONSILLECTOMY       Home Medications:  Prior to Admission medications   Medication Sig Start Date End Date Taking? Authorizing  Provider  acetaminophen (TYLENOL) 500 MG tablet Take 1,000 mg by mouth daily as needed for moderate pain or headache.   Yes [provider]  ALPRAZolam Prudy Feeler) 0.5 MG tablet Take 0.5 mg by mouth 2 (two) times daily.   Yes [provider]  amLODipine (NORVASC) 5 MG tablet Take 5 mg by mouth daily.   Yes [provider]  Ascorbic Acid (VITAMIN C) 1000 MG tablet Take 1,000 mg by mouth daily.   Yes [provider]  aspirin EC 81 MG tablet Take 81 mg by mouth daily.   Yes [provider]  BREZTRI AEROSPHERE 160-9-4.8 MCG/ACT AERO TAKE 2 PUFFS BY MOUTH TWICE A DAY (STOP TRELEGY) 12/06/20  Yes Salena Saner, MD  busPIRone (BUSPAR) 5 MG tablet Take 5 mg by mouth 2 (two) times daily. 02/01/21  Yes [provider]  dextromethorphan-guaiFENesin (GILTUSS DIABETIC COUGH & COLD) 10-100 MG/5ML liquid Take 30 mLs by mouth every 4 (four) hours as needed for cough.   Yes [provider]  esomeprazole (NEXIUM) 40 MG capsule Take 40 mg by mouth at bedtime.    Yes [provider]  ezetimibe (ZETIA) 10 MG tablet Take 10 mg by mouth at bedtime.    Yes [provider]  folic acid (FOLVITE) 400 MCG tablet Take 400 mcg by mouth every evening.   Yes [provider]  furosemide (LASIX) 40 MG tablet TAKE 1 TABLET BY MOUTH EVERY DAY 12/19/20  Yes Iran Ouch, MD  glipiZIDE (GLUCOTROL) 5 MG tablet Take 0.5 tablets (2.5 mg total) by mouth daily before breakfast. Patient taking differently: Take 5 mg by mouth See admin instructions. Take 5 mg in the morning and take a second 5 mg dose at night on Mon, Wed, and Fri 02/27/18  Yes Conte, Tessa N, PA-C  guaiFENesin (MUCINEX) 600 MG 12 hr tablet Take 1,200 mg by mouth every 12 (twelve) hours as needed. 01/23/21  Yes [provider]  ipratropium-albuterol (DUONEB) 0.5-2.5 (3) MG/3ML SOLN Take 3 mLs by nebulization every 6 (six) hours as needed. Patient taking differently: Take 3 mLs by  nebulization every 6 (six) hours as needed (shortness of breath). 02/03/18  Yes Emily Filbert, MD  losartan (COZAAR) 100 MG tablet Take 100 mg by mouth daily. 01/29/21  Yes [provider]  Melatonin 10 MG CAPS Take 10 mg by mouth at bedtime.   Yes [provider]  metFORMIN (GLUCOPHAGE) 1000 MG tablet TAKE 1 TABLET BY MOUTH TWICE DAILY WITH A MEAL Patient taking differently: Take 1,000 mg by mouth 2 (two) times daily. 04/27/18  Yes Donata Clay, Theron Arista, MD  metoprolol succinate (TOPROL-XL) 100 MG 24 hr tablet TAKE 1 TABLET BY MOUTH EVERY DAY WITH OR IMMEDIATELY FOLLOWING A MEAL 09/25/19  Yes Iran Ouch, MD  PROAIR HFA 108 [id number] Base) MCG/ACT inhaler Inhale 1 puff into the lungs every 4 (four) hours as needed for wheezing or shortness of breath. 02/04/19  Yes [provider]  sucralfate (CARAFATE) 1 GM/10ML suspension Take 10 mLs (1 g total) by mouth 4 (four) times daily -  with meals and at bedtime. 01/24/21  Yes Meredeth Ide, MD  Vitamin D, Cholecalciferol, 1000 units TABS Take 1,000 Units by mouth every morning.    Yes [provider]    Inpatient Medications: Scheduled Meds: . ALPRAZolam  0.5 mg Oral BID  . amLODipine  5 mg Oral Daily  . aspirin EC  81 mg Oral Daily  . busPIRone  5 mg Oral BID  . cholecalciferol  1,000 Units Oral BH-[id number]  . enoxaparin (LOVENOX) injection  40 mg Subcutaneous Q24H  . ezetimibe  10 mg Oral QHS  . folic acid  500 mcg Oral QPM  . furosemide  40 mg Oral Daily  . [START ON 02/05/2021] glipiZIDE  5 mg Oral Q breakfast  . insulin aspart  0-15 Units Subcutaneous TID WC  . insulin aspart  0-5 Units Subcutaneous QHS  . insulin glargine  10 Units Subcutaneous Daily  . losartan  100 mg Oral Daily  . melatonin  10 mg Oral QHS  . metoprolol succinate  100 mg Oral Daily  . mometasone-formoterol  2 puff Inhalation BID   And  . umeclidinium bromide  1 puff Inhalation Daily  . pantoprazole  40 mg Oral Daily  . sucralfate  1 g Oral  TID WC & HS   Continuous Infusions:  PRN Meds: acetaminophen **OR** acetaminophen, guaiFENesin, ipratropium-albuterol, ondansetron **OR** ondansetron (ZOFRAN) IV  Allergies:    Allergies  Allergen Reactions  . Atorvastatin Other (See Comments)    Leg aches and weakness     Social History:   Social History   Socioeconomic History  . Marital status: Widowed    Spouse name: Not on file  . Number of children: Not on file  . Years of education: Not on file  . Highest education level: Not on file  Occupational History  . Not on file  Tobacco Use  . Smoking status: Former Smoker    Packs/day: 1.00    Years: 40.00    Pack years: 40.00    Types: Cigarettes    Quit date: 02/02/2018    Years since quitting: 3.0  . Smokeless tobacco: Never Used  . Tobacco comment: Quit 01/2018 - on 05/01/2018 talked to Rigo about Relaspe concerns. He ststaes that he has no taste for tobacco since he quit in Feb.  Vaping Use  . Vaping Use: Never used  Substance and Sexual Activity  . Alcohol use: Yes    Comment: occ  . Drug use: No  . Sexual activity: Yes  Other Topics Concern  . Not on file  Social History Narrative   Lives in [place]) by himself.  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  Fairly active but doesn't routinely exercise.   Social Determinants of Health   Financial Resource Strain: Not on file  Food Insecurity: Not on file  Transportation Needs: Not on file  Physical Activity: Not on file  Stress: Not on file  Social Connections: Not on file  Intimate  Partner Violence: Not on file    Family History:    Family History  Problem Relation Age of Onset  . Lymphoma Mother   . Peripheral vascular disease Father      ROS:  Please see the history of present illness.  Review of Systems  Constitutional: Positive for malaise/fatigue.       Fatigue due to insomnia  Respiratory: Positive for cough and shortness of breath.        Cough with brown  sputum Ongoing shortness of breath on nasal cannula oxygen  Cardiovascular: Positive for chest pain and claudication. Negative for palpitations, orthopnea and leg swelling.       Intermittent chest tightness Known claudication symptoms  Gastrointestinal: Negative for nausea.  Musculoskeletal: Negative for falls.  Neurological: Positive for dizziness. Negative for loss of consciousness.       Reports dizziness only when standing too quickly  Psychiatric/Behavioral: The patient is nervous/anxious and has insomnia.   All other systems reviewed and are negative.   All other ROS reviewed and negative.     Physical Exam/Data:   Vitals:   02/04/21 0820 02/04/21 0925 02/04/21 0932 02/04/21 1108  BP: (!) 123/49 (!) 135/50  (!) 124/55  Pulse: 100 99  (!) 102  Resp: (!) 24 19  19   Temp:  97.7 F (36.5 C)  97.7 F (36.5 C)  TempSrc:      SpO2: 97% 100% 92% 94%  Weight:  72.5 kg    Height:  5\' 6"  (1.676 m)      Intake/Output Summary (Last 24 hours) at 02/04/2021 1303 Last data filed at 02/04/2021 1108 Gross per 24 hour  Intake -  Output 225 ml  Net -225 ml   Last 3 Weights 02/04/2021 02/04/2021 01/24/2021  Weight (lbs) 159 lb 14.4 oz 174 lb 191 lb 12.8 oz  Weight (kg) 72.53 kg 78.926 kg 87 kg     Body mass index is 25.81 kg/m.  General:  Well nourished, well developed, in no acute distress.  Watching television HEENT: normal Lymph: no adenopathy Neck: no JVD Endocrine:  No thryomegaly Vascular: No carotid bruits; FA pulses 2+ bilaterally without bruits  Cardiac:  normal S1, S2; tachycardic but regular; no murmur  Lungs:  clear to auscultation bilaterally, no wheezing, rhonchi or rales  Abd: soft, nontender, no hepatomegaly  Ext: no edema Musculoskeletal:  No deformities, BUE and BLE strength normal and equal Skin: warm and dry  Neuro:  CNs 2-12 intact, no focal abnormalities noted Psych:  Normal affect   EKG:  The EKG was personally reviewed and demonstrates: ST, RBBB, LPF B,  prior inferior infarct Telemetry:  Telemetry was personally reviewed and demonstrates: Sinus tachycardia with rates in the low 100s, NSVT, PVCs, short run of SVT with rates 120s  Relevant CV Studies: Updated echo ordered and pending  Echo 09/20/20 Left ventricular ejection fraction, by estimation, is 45 to 50%. The  left ventricle has mildly decreased function. The left ventricle  demonstrates regional wall motion abnormalities (see scoring  diagram/findings for description). Left ventricular  diastolic parameters are consistent with Grade I diastolic dysfunction  (impaired relaxation). There is dyskinesis of the left ventricular, apical  segment.  2. Right ventricular systolic function is moderately reduced. The right  ventricular size is normal.  3. Left atrial size was mildly dilated.  4. The mitral valve is normal in structure. Mild mitral valve  regurgitation.  5. The aortic valve was not well visualized. Aortic valve regurgitation  is not visualized.  No aortic stenosis is present. There is a 25 mm Edwards  valve present in the aortic position. Procedure Date: 02/19/18. Aortic valve  mean gradient measures 9.0  mmHg.  6. Pulmonic valve regurgitation not well assessed.  7. The inferior vena cava is normal in size with greater than 50%  respiratory variability, suggesting right atrial pressure of 3 mmHg.   Laboratory Data:  High Sensitivity Troponin:   Recent Labs  Lab 01/11/21 0538 01/11/21 1335 01/11/21 1415 02/04/21 0406 02/04/21 0935  TROPONINIHS 21* 67* 30* 24* 79*     Chemistry Recent Labs  Lab 02/04/21 0406  NA 131*  K 3.8  CL 99  CO2 22  GLUCOSE 223*  BUN 16  CREATININE 0.97  CALCIUM 8.2*  GFRNONAA >60  ANIONGAP 10    Recent Labs  Lab 02/04/21 0406  PROT 6.5  ALBUMIN 2.8*  AST 22  ALT 34  ALKPHOS 66  BILITOT 1.2   Hematology Recent Labs  Lab 02/04/21 0406  WBC 9.0  RBC 4.30  HGB 13.2  HCT 38.5*  MCV 89.5  MCH 30.7  MCHC 34.3   RDW 13.7  PLT 148*   BNP Recent Labs  Lab 02/04/21 0406  BNP 293.2*    DDimer No results for input(s): DDIMER in the last 168 hours.   Radiology/Studies:  CT ANGIO CHEST PE W OR WO CONTRAST  Result Date: 02/04/2021 CLINICAL DATA:  Rule out pulmonary embolus.  High probability. EXAM: CT ANGIOGRAPHY CHEST WITH CONTRAST TECHNIQUE: Multidetector CT imaging of the chest was performed using the standard protocol during bolus administration of intravenous contrast. Multiplanar CT image reconstructions and MIPs were obtained to evaluate the vascular anatomy. CONTRAST:  75mL OMNIPAQUE IOHEXOL 350 MG/ML SOLN COMPARISON:  02/25/2019 FINDINGS: Cardiovascular: Mild scratch set heart size upper limits of normal. Previous median sternotomy and CABG procedure. Aortic atherosclerosis. The main pulmonary artery is patent. No lobar or segmental pulmonary artery filling defects. Mediastinum/Nodes: No enlarged axillary or supraclavicular lymph nodes. Prominent mediastinal lymph nodes are identified including 1.1 cm low right paratracheal lymph node, image 40/4. This is unchanged from previous exam. Left pre-vascular node measures 1.3 cm, image 38/4. Previously 1.1 cm. Subcarinal node measures 1.2 cm, image 48/4. Previously 0.8 cm. Prominent bilateral hilar lymphoid none of which meet CT criteria for adenopathy. Lungs/Pleura: Moderate changes of centrilobular emphysema. Patchy area of atelectasis and airspace density is noted within the posterior left lung base. Mild subsegmental atelectasis is noted in the subpleural right lung base. Patchy irregular nodular density within the superior segment of the left lower lobe measures 1.9 cm, image 47/6. New compared with 02/25/2019. Adjacent part solid nodule measures 1 cm, image 53/6. New compared with 02/25/2019. Small round solid nodule in the superior segment of right lower lobe measures 6 mm, image 50/6. New from previous exam. Within the right upper lobe there is a 5 mm  lung nodule, image 28/6. New from previous exam. Upper Abdomen: Bilateral kidney cysts are again noted. The largest arises from the upper pole of right kidney measuring 5.5 cm, image 90/4. Aortic atherosclerosis noted. Prominent gastrohepatic ligament node measures 1.2 cm, image 90/4. Musculoskeletal: No acute osseous findings mild multilevel degenerative disc disease noted within the lower thoracic spine. Review of the MIP images confirms the above findings. IMPRESSION: 1. No evidence for acute pulmonary embolus. 2. Patchy area of atelectasis and airspace density is noted within the posterior left lung base. Findings are nonspecific but are favored to reflect inflammatory/infectious process. 3. Scattered lung nodules,  new from previous exam. The dominant nodule is in the superior segment of left lower lobe measuring 1.9 cm. Non-contrast chest CT at 3-6 months is recommended. If the nodules are stable at time of repeat CT, then future CT at 18-24 months (from today's scan) is considered optional for low-risk patients, but is recommended for high-risk patients. This recommendation follows the consensus statement: Guidelines for Management of Incidental Pulmonary Nodules Detected on CT Images: From the Fleischner Society 2017; Radiology 2017; 284:228-243. 4. Prominent mediastinal and hilar lymph nodes are again noted. These are nonspecific in the setting of pneumonia and may be reactive in etiology. Attention on follow-up imaging is advised. Aortic Atherosclerosis (ICD10-I70.0) and Emphysema (ICD10-J43.9). Electronically Signed   By: Signa Kellaylor  Stroud M.D.   On: 02/04/2021 06:54   DG Chest Portable 1 View  Result Date: 02/04/2021 CLINICAL DATA:  Shortness of breath and cough. EXAM: PORTABLE CHEST 1 VIEW COMPARISON:  01/21/2021 FINDINGS: Interstitial coarsening which appears bronchitic based on previous imaging. There is no edema, discrete consolidation, effusion, or pneumothorax. Normal heart size. Prior CABG and  aortic valve replacement. IMPRESSION: Bronchitic markings without acute superimposed finding. Electronically Signed   By: Marnee SpringJonathon  Watts M.D.   On: 02/04/2021 04:45     Assessment and Plan:   Chronic Systolic heart failure/ICM --Reports improvement in shortness of breath since presenting to the emergency department.  Ongoing shortness of breath since COVID-19 pneumonia.  Continues to report exertional dyspnea as noted at previous office visits.  No other signs or symptoms of volume overload.  Wonders to the extent to which his anxiety may be contributing.  CTA as above with scattered pulmonary nodules and recommendations as below for repeat imaging.  Most recent echo as above with EF 45 to 50% with normal functioning bioprosthetic valve.  Repeat echo ordered and pending.  --Appears euvolemic and well compensated on exam. --Daily BMET.    Cr 0.97, BUN 16.  Replete electrolytes with K goal 4.0, Mg 2.0. --I's/O's, daily standing weights.  Wt 72.5kg.  So far, -225cc. --Continue PTA Lasix 40 mg daily, BB, losartan 100 mg daily.    Consider changing from Toprol to Lopressor to allow for easier titration of beta-blocker.  Given his elevated rates/ectopy on telemetry, he may benefit from escalation of his current beta-blocker versus the recently added amlodipine (see below under HTN). As previously noted, could also consider switching from losartan to [facility]o in the future. --Wean steroids to reduce incidence of tachycardia and reduce risk of volume retention.  Further recommendations, if indicated pending resulted repeat echo.  CAD s/p CABG Elevated HS Tn --Reports ongoing chest tightness as previously reported. CP is atypical as in past. S/p CABG 02/2018. HS Tn 24  79.  EKG without acute ST/T changes.   --Suspect supply demand ischemia in the setting of his tachycardia and hypoxia. Echo ordered for further risk stratification.  At this time, no plan for invasive ischemic work-up unless  high-sensitivity troponin bumps significantly, echo shows reduced EF/WMA, angina reported by patient.  Obtain echo to reassess EF, AVR, and r/o WMA.  Continue to cycle high-sensitivity troponin until peaked, downtrending.  Continue to monitor on telemetry.  No indication for IV heparin at this time.  Continue current Lovenox.    Continue ASA, BB, as needed sublingual nitro.  As above, recommend escalation of beta-blocker as needed for rate/ectopy control.  Consider changing Toprol to lopressor during this escalation / titration to allow for ease of rate control  Wean steroids as tolerated to  reduce tachycardia.    Not currently on a statin and would likely benefit from consideration of PCSK9 inhibitors if intolerant to statins.  Continue Zetia.   Aggressive risk factor modification.  Further recommendations, if indicated, pending echo.  HTN --Amlodipine 5 mg was added to his medications.  Recommend discontinuing amlodipine and instead escalating his beta-blocker, given his tachycardia and ectopy on telemetry.  SVT / NSVT PVCs History of postop Afib --Monitor closely on telemetry. Escalate BB as above. Check TSH. Ensure electrolytes at goal.   AS s/p bioprosthetic valve --Most recent echo showed normal functioning bioprosthetic valve.  Further recommendations, if indicated, pending echo ordered today.  Continue to monitor.  HLD --Continue Zetia. Not currently on a statin. On review of EMR, his statin was also not noted on his medication list back at his 09/2020 office visit.   PAD --S/p recent left lower extremity intervention.  Statin versus PCSK9 inhibitors recommended in the setting of his known PAD and CAD.  Continue Zetia.  Continue outpatient follow-up.  Pulmonary nodules --2/19 CTA showed scattered pulmonary nodules.  Dominant 1.9 cm nodule of the left lower lobe noted on CT scan with recommendation for noncontrast chest CT at 3 to 6 months as indicated in report.   Recommend follow-up and imaging monitoring per PCP/IM.  Recent COVID-19 pneumonia --Recent 12/2020 admission for COVID-19 pneumonia.  He reports ongoing shortness of breath since that time.  Suspect this is contributing to his symptoms.  CTA shows findings nonspecific but thought to reflect inflammatory/infectious process.  COPD Previous tobacco use --Emphysema noted on CT scan.  Consider as contributing to his overall symptoms.  Reports that he will have quit smoking for 3 years tomorrow 02/05/21.  Congratulated him on his ongoing smoking cessation.  History of GI bleed --Daily CBC.  Due to previous GI bleed, DAPT with ASA and Plavix discontinued and ASA later restarted once safe to do so.  Continue Protonix 40 mg daily.  Anxiety --Continue Xanax.      TIMI Risk Score for Unstable Angina or Non-ST Elevation MI:   The patient's TIMI risk score is 5, which indicates a 26% risk of all cause mortality, new or recurrent myocardial infarction or need for urgent revascularization in the next 14 days.   New York Heart Association (NYHA) Functional Class NYHA Class II        For questions or updates, please contact [facility] Please consult [email or web address] for contact info under    Signed, Lennon Alstrom, PA-C  02/04/2021 1:03 PM

## 2021-02-04 NOTE — Progress Notes (Signed)
   02/04/21 2010  Clinical Encounter Type  Visited With Patient  Visit Type Initial;Psychological support;Spiritual support;Social support  Referral From Nurse  Consult/Referral To Chaplain  Spiritual Encounters  Spiritual Needs Prayer;Ritual;Emotional   Chaplain responded to Order Requisition. PT requested prayer. Chaplain prayed with PT. PT was able to talk about his health, and how he believes in "the power of God". PT requested on-call chaplain to give him communion in the morning.

## 2021-02-04 NOTE — ED Notes (Signed)
Request for med verification sent to pharmacy.

## 2021-02-04 NOTE — ED Notes (Signed)
Patient transported to CT, placed on NRB for transport.

## 2021-02-04 NOTE — ED Notes (Signed)
Dr. Duncan at bedside 

## 2021-02-04 NOTE — Progress Notes (Signed)
Patient ID: Harold Parker, male   DOB: January 16, 1946, 75 y.o.   MRN: [id number] [facility] PROGRESS NOTE  Harold Parker WIO:[id number] DOB: February 08, 1946 DOA: 02/04/2021 PCP: Merlene Laughter, MD  HPI/Subjective: Patient states that he was sent home from the hospital too early and needed a couple more days.  He feels more comfortable here.  He gets very anxious at home and starts rapidly breathing.  He has not slept in a while.  Patient complains of shortness of breath.  Objective: Vitals:   02/04/21 1108 02/04/21 1616  BP: (!) 124/55 117/67  Pulse: (!) 102 88  Resp: 19 19  Temp: 97.7 F (36.5 C) 97.7 F (36.5 C)  SpO2: 94% 96%    Intake/Output Summary (Last 24 hours) at 02/04/2021 1732 Last data filed at 02/04/2021 1420 Gross per 24 hour  Intake --  Output 425 ml  Net -425 ml   Filed Weights   02/04/21 0406 02/04/21 0925  Weight: 78.9 kg 72.5 kg    ROS: Review of Systems  Respiratory: Positive for cough and shortness of breath.   Cardiovascular: Positive for chest pain.  Gastrointestinal: Negative for abdominal pain, nausea and vomiting.   Exam: Physical Exam HENT:     Head: Normocephalic.     Mouth/Throat:     Pharynx: No oropharyngeal exudate.  Eyes:     General: Lids are normal.     Conjunctiva/sclera: Conjunctivae normal.     Pupils: Pupils are equal, round, and reactive to light.  Cardiovascular:     Rate and Rhythm: Normal rate and regular rhythm.     Heart sounds: Normal heart sounds, S1 normal and S2 normal.  Pulmonary:     Breath sounds: Examination of the right-middle field reveals decreased breath sounds. Examination of the left-middle field reveals decreased breath sounds. Examination of the right-lower field reveals decreased breath sounds and rhonchi. Examination of the left-lower field reveals decreased breath sounds and rhonchi. Decreased breath sounds and rhonchi present. No wheezing or rales.  Abdominal:     Palpations: Abdomen is soft.      Tenderness: There is no abdominal tenderness.  Musculoskeletal:     Right lower leg: No swelling.     Left lower leg: No swelling.  Skin:    General: Skin is warm.     Findings: No rash.  Neurological:     Mental Status: He is alert and oriented to person, place, and time.       Data Reviewed: Basic Metabolic Panel: Recent Labs  Lab 02/04/21 0406 02/04/21 1305  NA 131*  --   K 3.8  --   CL 99  --   CO2 22  --   GLUCOSE 223*  --   BUN 16  --   CREATININE 0.97  --   CALCIUM 8.2*  --   MG  --  2.0   Liver Function Tests: Recent Labs  Lab 02/04/21 0406  AST 22  ALT 34  ALKPHOS 66  BILITOT 1.2  PROT 6.5  ALBUMIN 2.8*   CBC: Recent Labs  Lab 02/04/21 0406  WBC 9.0  NEUTROABS 6.1  HGB 13.2  HCT 38.5*  MCV 89.5  PLT 148*   BNP (last 3 results) Recent Labs    01/11/21 0538 01/13/21 0434 02/04/21 0406  BNP 181.6* 591.1* 293.2*     CBG: Recent Labs  Lab 02/04/21 1240 02/04/21 1615  GLUCAP 511* 355*     Studies: CT ANGIO CHEST PE W OR WO CONTRAST  Result Date:  02/04/2021 CLINICAL DATA:  Rule out pulmonary embolus.  High probability. EXAM: CT ANGIOGRAPHY CHEST WITH CONTRAST TECHNIQUE: Multidetector CT imaging of the chest was performed using the standard protocol during bolus administration of intravenous contrast. Multiplanar CT image reconstructions and MIPs were obtained to evaluate the vascular anatomy. CONTRAST:  42mL OMNIPAQUE IOHEXOL 350 MG/ML SOLN COMPARISON:  02/25/2019 FINDINGS: Cardiovascular: Mild scratch set heart size upper limits of normal. Previous median sternotomy and CABG procedure. Aortic atherosclerosis. The main pulmonary artery is patent. No lobar or segmental pulmonary artery filling defects. Mediastinum/Nodes: No enlarged axillary or supraclavicular lymph nodes. Prominent mediastinal lymph nodes are identified including 1.1 cm low right paratracheal lymph node, image 40/4. This is unchanged from previous exam. Left pre-vascular  node measures 1.3 cm, image 38/4. Previously 1.1 cm. Subcarinal node measures 1.2 cm, image 48/4. Previously 0.8 cm. Prominent bilateral hilar lymphoid none of which meet CT criteria for adenopathy. Lungs/Pleura: Moderate changes of centrilobular emphysema. Patchy area of atelectasis and airspace density is noted within the posterior left lung base. Mild subsegmental atelectasis is noted in the subpleural right lung base. Patchy irregular nodular density within the superior segment of the left lower lobe measures 1.9 cm, image 47/6. New compared with 02/25/2019. Adjacent part solid nodule measures 1 cm, image 53/6. New compared with 02/25/2019. Small round solid nodule in the superior segment of right lower lobe measures 6 mm, image 50/6. New from previous exam. Within the right upper lobe there is a 5 mm lung nodule, image 28/6. New from previous exam. Upper Abdomen: Bilateral kidney cysts are again noted. The largest arises from the upper pole of right kidney measuring 5.5 cm, image 90/4. Aortic atherosclerosis noted. Prominent gastrohepatic ligament node measures 1.2 cm, image 90/4. Musculoskeletal: No acute osseous findings mild multilevel degenerative disc disease noted within the lower thoracic spine. Review of the MIP images confirms the above findings. IMPRESSION: 1. No evidence for acute pulmonary embolus. 2. Patchy area of atelectasis and airspace density is noted within the posterior left lung base. Findings are nonspecific but are favored to reflect inflammatory/infectious process. 3. Scattered lung nodules, new from previous exam. The dominant nodule is in the superior segment of left lower lobe measuring 1.9 cm. Non-contrast chest CT at 3-6 months is recommended. If the nodules are stable at time of repeat CT, then future CT at 18-24 months (from today's scan) is considered optional for low-risk patients, but is recommended for high-risk patients. This recommendation follows the consensus statement:  Guidelines for Management of Incidental Pulmonary Nodules Detected on CT Images: From the Fleischner Society 2017; Radiology 2017; 284:228-243. 4. Prominent mediastinal and hilar lymph nodes are again noted. These are nonspecific in the setting of pneumonia and may be reactive in etiology. Attention on follow-up imaging is advised. Aortic Atherosclerosis (ICD10-I70.0) and Emphysema (ICD10-J43.9). Electronically Signed   By: Signa Kell M.D.   On: 02/04/2021 06:54   DG Chest Portable 1 View  Result Date: 02/04/2021 CLINICAL DATA:  Shortness of breath and cough. EXAM: PORTABLE CHEST 1 VIEW COMPARISON:  01/21/2021 FINDINGS: Interstitial coarsening which appears bronchitic based on previous imaging. There is no edema, discrete consolidation, effusion, or pneumothorax. Normal heart size. Prior CABG and aortic valve replacement. IMPRESSION: Bronchitic markings without acute superimposed finding. Electronically Signed   By: Marnee Spring M.D.   On: 02/04/2021 04:45    Scheduled Meds: . ALPRAZolam  0.5 mg Oral BID  . amLODipine  5 mg Oral Daily  . arformoterol  15 mcg Nebulization BID  .  aspirin EC  81 mg Oral Daily  . budesonide (PULMICORT) nebulizer solution  0.5 mg Nebulization BID  . busPIRone  5 mg Oral BID  . cholecalciferol  1,000 Units Oral BH-q7a  . enoxaparin (LOVENOX) injection  40 mg Subcutaneous Q24H  . ezetimibe  10 mg Oral QHS  . folic acid  500 mcg Oral QPM  . furosemide  40 mg Oral Daily  . [START ON 02/05/2021] glipiZIDE  5 mg Oral Q breakfast  . insulin aspart  0-15 Units Subcutaneous TID WC  . insulin aspart  0-5 Units Subcutaneous QHS  . insulin glargine  10 Units Subcutaneous Daily  . losartan  100 mg Oral Daily  . melatonin  10 mg Oral QHS  . metoprolol succinate  100 mg Oral Daily  . pantoprazole  40 mg Oral Daily  . revefenacin  175 mcg Nebulization Daily  . sucralfate  1 g Oral TID WC & HS    Assessment/Plan:  1. Acute on chronic hypoxic respiratory failure.   Patient with recent COVID pneumonia on 01/11/2021.  Patient was recently discharged home on 4 L.  When patient came in pulse ox 87% on 4 L.  Patient was on 6 L when I saw him this morning.  Encourage slow deep breathing. 2. COPD exacerbation and COVID exacerbation.  I was going to start steroids but his sugar was greater than 500.  Nebulizers ordered instead. 3. Pulmonary nodule seen on CT scan.  Case discussed with pulmonary and they believe it may be secondary to COVID and they will follow-up as outpatient. 4. Type 2 diabetes mellitus with hyperglycemia.  Started low-dose glargine insulin and restarted glipizide with sugar greater than 500 today. 5. Elevated troponin likely demand ischemia from respiratory issues.  Patient also complained of nonspecific chest pain. 6. 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  Continue losartan metoprolol Lasix 7. Anxiety and insomnia.  Continue antianxiety medications and trial of trazodone as needed at night     Code Status:     Code Status Orders  (From admission, onward)         Start     Ordered   02/04/21 0531  Full code  Continuous        02/04/21 0532        Code Status History    Date Active Date Inactive Code Status Order ID Comments User Context   01/11/2021 1113 01/24/2021 2245 Full Code [id number]  Lorretta Harp, MD ED   03/04/2019 1040 03/04/2019 1733 Full Code [id number]  Iran Ouch, MD Inpatient   03/14/2018 0135 03/18/2018 1900 Full Code [id number]  Cammy Copa, MD ED   02/07/2018 1636 02/26/2018 2129 Full Code [id number]  Creig Hines, NP Inpatient   02/05/2018 2047 02/07/2018 1608 Full Code [id number]  Shaune Pollack, MD Inpatient   Advance Care Planning Activity     Disposition Plan: Status is: Inpatient  Dispo: The patient is from: Home              Anticipated d/c is to: Home              Anticipated d/c date is: Likely a few days here in the hospital              Patient currently had  increasing oxygen requirements and would like to get his oxygen requirements less prior to disposition.   Difficult to place patient.  No  Time spent: 31 minutes  Harold Parker [employer]

## 2021-02-04 NOTE — ED Notes (Signed)
Pt returned from CT and bipap replaced. Pt dyspneic with little exertion. Pt remains A&Ox4. Visitor at bedside.

## 2021-02-04 NOTE — ED Notes (Signed)
ED Provider at bedside. 

## 2021-02-04 NOTE — ED Provider Notes (Signed)
[facility] Emergency Department Provider Note  ____________________________________________  Time seen: Approximately 4:25 AM  I have reviewed the triage vital signs and the nursing notes.   HISTORY  Chief Complaint Respiratory Distress   HPI Harold Parker is a 75 y.o. male with a history of recent COVID-19, diabetes, hypertension, hyperlipidemia, CHF, COPD, smoking, CAD status post CABG who presents for evaluation of shortness of breath.  Patient was admitted for 2 weeks and discharged 10 days ago for respiratory failure in the setting of Covid pneumonia.  He was discharged home on 4 L nasal cannula.  Patient reports that even before he was discharged he was not feeling any better.  Since being home patient has been needed to increase his oxygen.  Continues to have daily shortness of breath which elicits a lot of anxiety from him.  He said this evening the shortness of breath became severe which made him call 911.  He denies chest pain, leg pain or swelling, fever.  Continues to have a cough productive of brown sputum.   Patient was hypoxic on his 4 L when EMS arrived.  Received 4 breathing treatments, Solu-Medrol, and magnesium in route.  Past Medical History:  Diagnosis Date  . Bilateral carotid bruits    a. 01/2018 U/S: < 50% bilat ICA stenosis.  Marland Kitchen CAD (coronary artery disease)    a. 1998 s/p MI and BMS [facility] [place]); b. 1999 redo PCI/rotablator in setting of what sounds like ISR;  c. Multiple stress tests over the years - last ~ 2017, reportedly nl; d. 01/2018 NSTEMI/Cath: LM 53m/d, LAD 50p, 40p/m, D1 60ost, OM1 95, RCA 100ost/p w/ L->R collats, EF 45%; e. s/p 3V CABG 02/19/18 (LIMA-LAD, VG-D1, VG-OM)  . Chronic lower back pain   . COPD (chronic obstructive pulmonary disease) (HCC)   . GIB (gastrointestinal bleeding)    a. 02/2018 GIB and anemia w/ Hgb of 4.7 on presentation; b. 03/2018 EGD: 2 nonbleeding duodenal ulcers.  Marland Kitchen HTN (hypertension)   .  Hypercholesteremia   . Ischemic cardiomyopathy    a. 01/2018 Echo: EF 40-45%, mid-apicalanteroseptal, ant, apical sev HK, mod apicalinferior HK. Gr2 DD. Mod AS, mild MR, mod dil LA, PASP .  . Moderate aortic stenosis    a. 01/2018 Echo: Mod AS, mean grad (S) , Valve area (VTI) 1.06 cm^2, (Vmax) 1.27 cm^2; b. 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  . Myocardial infarction (HCC) ~ 1998/1999  . 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 02/19/2018 AVR: 25 mm Edwards Inspiris Resilia stented bovine pericardial tissue valve  . 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 Tobacco abuse   . Type II diabetes mellitus [facility])     Patient Active Problem List   Diagnosis Date Noted  . History of COVID-19 pneumonia 01/11/21 02/04/2021  . Acute respiratory disease due to COVID-19 virus 01/11/2021  . HTN (hypertension)   . Chronic systolic CHF (congestive heart failure) (HCC)   . COPD (chronic obstructive pulmonary disease) (HCC)   . GERD (gastroesophageal reflux disease)   . Anxiety   . Acute renal failure superimposed on stage 2 chronic kidney disease (HCC)   . Hyperkalemia   . Elevated troponin   . Acute hypoxemic respiratory failure due to COVID-19 (HCC)   . Melena   . Duodenal ulceration   . Gastrointestinal hemorrhage   . Severe anemia 03/14/2018  . 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 S/P aortic valve replacement with bioprosthetic valve  02/19/2018  . Preoperative respiratory examination 02/13/2018  . Acute on chronic respiratory failure with hypoxia (HCC) 02/11/2018  . Influenza with respiratory manifestation 02/11/2018  . Stage 3 severe COPD by GOLD classification (HCC)   . Aortic stenosis   . Bronchitis, chronic obstructive, with exacerbation (HCC)   . Tobacco abuse   . CAD (coronary artery disease) 02/07/2018  . Acute systolic CHF (congestive heart failure) (HCC) 02/07/2018  . Ischemic cardiomyopathy 02/07/2018  .  Type II diabetes mellitus with renal manifestations (HCC) 02/07/2018  . Hyperlipidemia LDL goal <70 02/07/2018  . COPD exacerbation (HCC) 02/05/2018  . NSTEMI (non-ST elevated myocardial infarction) (HCC) 02/05/2018    Past Surgical History:  Procedure Laterality Date  . ABDOMINAL AORTOGRAM N/A 03/04/2019   Procedure: ABDOMINAL AORTOGRAM;  Surgeon: Iran Ouch, MD;  [facility] INVASIVE CV LAB;  Service: Cardiovascular;  Laterality: N/A;  . AORTIC VALVE REPLACEMENT N/A 02/19/2018   Procedure: AORTIC VALVE REPLACEMENT (AVR);  Surgeon: Purcell Nails, MD;  Location: [facility] OR;  Service: Open Heart Surgery;  Laterality: N/A;  . COLONOSCOPY    . CORONARY ANGIOPLASTY WITH STENT PLACEMENT  ~ 1998/1999  . CORONARY ARTERY BYPASS GRAFT N/A 02/19/2018   Procedure: CORONARY ARTERY BYPASS GRAFTING (CABG) x three , using left internal mammary artery and right leg greater saphenous vein harvested endoscopically;  Surgeon: Purcell Nails, MD;  Location: [facility] OR;  Service: Open Heart Surgery;  Laterality: N/A;  . ESOPHAGOGASTRODUODENOSCOPY (EGD) WITH PROPOFOL N/A 03/18/2018   Procedure: ESOPHAGOGASTRODUODENOSCOPY (EGD) WITH PROPOFOL;  Surgeon: Midge Minium, MD;  Location: [facility] ENDOSCOPY;  Service: Endoscopy;  Laterality: N/A;  . LEFT HEART CATH AND CORONARY ANGIOGRAPHY N/A 02/06/2018   Procedure: LEFT HEART CATH AND CORONARY ANGIOGRAPHY;  Surgeon: Iran Ouch, MD;  Location: [facility] INVASIVE CV LAB;  Service: Cardiovascular;  Laterality: N/A;  . LOWER EXTREMITY ANGIOGRAPHY Bilateral 03/04/2019   Procedure: Lower Extremity Angiography;  Surgeon: Iran Ouch, MD;  Location: [facility] INVASIVE CV LAB;  Service: Cardiovascular;  Laterality: Bilateral;  . PERIPHERAL VASCULAR ATHERECTOMY Left 03/04/2019   Procedure: PERIPHERAL VASCULAR ATHERECTOMY;  Surgeon: Iran Ouch, MD;  Location: [facility] INVASIVE CV LAB;  Service: Cardiovascular;  Laterality: Left;  SFA  . TEE WITHOUT CARDIOVERSION N/A 02/19/2018   Procedure:  TRANSESOPHAGEAL ECHOCARDIOGRAM (TEE);  Surgeon: Purcell Nails, MD;  Location[facility] OR;  Service: Open Heart Surgery;  Laterality: N/A;  . TONSILLECTOMY      Prior to Admission medications   Medication Sig Start Date End Date Taking? Authorizing Provider  acetaminophen (TYLENOL) 500 MG tablet Take 1,000 mg by mouth daily as needed for moderate pain or headache.   Yes [provider]  ALPRAZolam Prudy Feeler) 0.5 MG tablet Take 0.5 mg by mouth 2 (two) times daily.   Yes [provider]  amLODipine (NORVASC) 5 MG tablet Take 5 mg by mouth daily.   Yes [provider]  Ascorbic Acid (VITAMIN C) 1000 MG tablet Take 1,000 mg by mouth daily.   Yes [provider]  aspirin EC 81 MG tablet Take 81 mg by mouth daily.   Yes [provider]  BREZTRI AEROSPHERE 160-9-4.8 MCG/ACT AERO TAKE 2 PUFFS BY MOUTH TWICE A DAY (STOP TRELEGY) 12/06/20  Yes Salena Saner, MD  busPIRone (BUSPAR) 5 MG tablet Take 5 mg by mouth 2 (two) times daily. 02/01/21  Yes [provider]  dextromethorphan-guaiFENesin (GILTUSS DIABETIC COUGH & COLD) 10-100 MG/5ML  liquid Take 30 mLs by mouth every 4 (four) hours as needed for cough.   Yes [provider]  esomeprazole (NEXIUM) 40 MG capsule Take 40 mg by mouth at bedtime.    Yes [provider]  ezetimibe (ZETIA) 10 MG tablet Take 10 mg by mouth at bedtime.    Yes [provider]  folic acid (FOLVITE) 400 MCG tablet Take 400 mcg by mouth every evening.   Yes [provider]  furosemide (LASIX) 40 MG tablet TAKE 1 TABLET BY MOUTH EVERY DAY 12/19/20  Yes Iran Ouch, MD  glipiZIDE (GLUCOTROL) 5 MG tablet Take 0.5 tablets (2.5 mg total) by mouth daily before breakfast. Patient taking differently: Take 5 mg by mouth See admin instructions. Take 5 mg in the morning and take a second 5 mg dose at night on Mon, Wed, and Fri 02/27/18  Yes Conte, Tessa N, PA-C  guaiFENesin (MUCINEX) 600 MG 12 hr tablet  Take 1,200 mg by mouth every 12 (twelve) hours as needed. 01/23/21  Yes [provider]  ipratropium-albuterol (DUONEB) 0.5-2.5 (3) MG/3ML SOLN Take 3 mLs by nebulization every 6 (six) hours as needed. Patient taking differently: Take 3 mLs by nebulization every 6 (six) hours as needed (shortness of breath). 02/03/18  Yes Emily Filbert, MD  losartan (COZAAR) 100 MG tablet Take 100 mg by mouth daily. 01/29/21  Yes [provider]  Melatonin 10 MG CAPS Take 10 mg by mouth at bedtime.   Yes [provider]  metFORMIN (GLUCOPHAGE) 1000 MG tablet TAKE 1 TABLET BY MOUTH TWICE DAILY WITH A MEAL Patient taking differently: Take 1,000 mg by mouth 2 (two) times daily. 04/27/18  Yes Kerin Perna, MD  metoprolol succinate (TOPROL-XL) 100 MG 24 hr tablet TAKE 1 TABLET BY MOUTH EVERY DAY WITH OR IMMEDIATELY FOLLOWING A MEAL 09/25/19  Yes Iran Ouch, MD  PROAIR HFA 108 [phone] Base) MCG/ACT inhaler Inhale 1 puff into the lungs every 4 (four) hours as needed for wheezing or shortness of breath. 02/04/19  Yes [provider]  sucralfate (CARAFATE) 1 GM/10ML suspension Take 10 mLs (1 g total) by mouth 4 (four) times daily -  with meals and at bedtime. 01/24/21  Yes Meredeth Ide, MD  Vitamin D, Cholecalciferol, 1000 units TABS Take 1,000 Units by mouth every morning.    Yes [provider]    Allergies Atorvastatin  Family History  Problem Relation Age of Onset  . Lymphoma Mother   . Peripheral vascular disease Father     Social History Social History   Tobacco Use  . Smoking status: Former Smoker    Packs/day: 1.00    Years: 40.00    Pack years: 40.00    Types: Cigarettes    Quit date: 02/02/2018    Years since quitting: 3.0  . Smokeless tobacco: Never Used  . Tobacco comment: Quit 01/2018 - on 05/01/2018 talked to Kyzer about Relas[place] concerns. He ststaes that he has no taste for tobacco since he quit in Feb.  Vaping Use  . Vaping Use: Never used   Substance Use Topics  . Alcohol use: Yes    Comment: occ  . Drug use: No    Review of Systems  Constitutional: Negative for fever. Eyes: Negative for visual changes. ENT: Negative for sore throat. Neck: No neck pain  Cardiovascular: Negative for chest pain. Respiratory: + shortness of breath, cough Gastrointestinal: Negative for abdominal pain, vomiting or diarrhea. Genitourinary: Negative for dysuria. Musculoskeletal: Negative  for back pain. Skin: Negative for rash. Neurological: Negative for headaches, weakness or numbness. Psych: No SI or HI  ____________________________________________   PHYSICAL EXAM:  VITAL SIGNS: ED Triage Vitals  Enc Vitals Group     BP 02/04/21 0402 (!) 112/96     Pulse Rate 02/04/21 0402 (!) 127     Resp 02/04/21 0402 (!) 21     Temp 02/04/21 0402 98.2 F (36.8 C)     Temp Source 02/04/21 0402 Oral     SpO2 02/04/21 0359 (!) 87 %     Weight 02/04/21 0406 174 lb (78.9 kg)     Height 02/04/21 0406 5\' 5"  (1.651 m)     Head Circumference --      Peak Flow --      Pain Score 02/04/21 0409 6     Pain Loc --      Pain Edu? --      Excl. in GC? --     Constitutional: Alert and oriented, moderate respiratory distress.  HEENT:      Head: Normocephalic and atraumatic.         Eyes: Conjunctivae are normal. Sclera is non-icteric.       Mouth/Throat: Mucous membranes are moist.       Neck: Supple with no signs of meningismus. Cardiovascular: Tachycardic with regular rhythm, no murmur Respiratory: Increased work of breathing, tachypneic to the 30s, sats are normal on a nonrebreather, still diminished air movement bilaterally with no wheezing or crackle Gastrointestinal: Soft, non tender, and non distended. Musculoskeletal: No edema, cyanosis, or erythema of extremities. Neurologic: Normal speech and language. Face is symmetric. Moving all extremities. No gross focal neurologic deficits are appreciated. Skin: Skin is warm, dry and intact. No rash  noted. Psychiatric: Mood and affect are normal. Speech and behavior are normal.  ____________________________________________   LABS (all labs ordered are listed, but only abnormal results are displayed)  Labs Reviewed  BRAIN NATRIURETIC PEPTIDE - Abnormal; Notable for the following components:      Result Value   B Natriuretic Peptide 293.2 (*)    All other components within normal limits  CBC WITH DIFFERENTIAL/PLATELET - Abnormal; Notable for the following components:   HCT 38.5 (*)    Platelets 148 (*)    All other components within normal limits  COMPREHENSIVE METABOLIC PANEL - Abnormal; Notable for the following components:   Sodium 131 (*)    Glucose, Bld 223 (*)    Calcium 8.2 (*)    Albumin 2.8 (*)    All other components within normal limits  TROPONIN I (HIGH SENSITIVITY) - Abnormal; Notable for the following components:   Troponin I (High Sensitivity) 24 (*)    All other components within normal limits  PROCALCITONIN  TROPONIN I (HIGH SENSITIVITY)   ____________________________________________  EKG  ED ECG REPORT I, Nita Sickle, the attending physician, personally viewed and interpreted this ECG.  Sinus tachycardia, rate of 128, right bundle branch block, LPF B, no ST elevations.  No changes when compared to prior. ____________________________________________  RADIOLOGY  I have personally reviewed the images performed during this visit and I agree with the Radiologist's read.   Interpretation by Radiologist:  CT ANGIO CHEST PE W OR WO CONTRAST  Result Date: 02/04/2021 CLINICAL DATA:  Rule out pulmonary embolus.  High probability. EXAM: CT ANGIOGRAPHY CHEST WITH CONTRAST TECHNIQUE: Multidetector CT imaging of the chest was performed using the standard protocol during bolus administration of intravenous contrast. Multiplanar CT image reconstructions and MIPs were  obtained to evaluate the vascular anatomy. CONTRAST:  75mL OMNIPAQUE IOHEXOL 350 MG/ML SOLN  COMPARISON:  02/25/2019 FINDINGS: Cardiovascular: Mild scratch set heart size upper limits of normal. Previous median sternotomy and CABG procedure. Aortic atherosclerosis. The main pulmonary artery is patent. No lobar or segmental pulmonary artery filling defects. Mediastinum/Nodes: No enlarged axillary or supraclavicular lymph nodes. Prominent mediastinal lymph nodes are identified including 1.1 cm low right paratracheal lymph node, image 40/4. This is unchanged from previous exam. Left pre-vascular node measures 1.3 cm, image 38/4. Previously 1.1 cm. Subcarinal node measures 1.2 cm, image 48/4. Previously 0.8 cm. Prominent bilateral hilar lymphoid none of which meet CT criteria for adenopathy. Lungs/Pleura: Moderate changes of centrilobular emphysema. Patchy area of atelectasis and airspace density is noted within the posterior left lung base. Mild subsegmental atelectasis is noted in the subpleural right lung base. Patchy irregular nodular density within the superior segment of the left lower lobe measures 1.9 cm, image 47/6. New compared with 02/25/2019. Adjacent part solid nodule measures 1 cm, image 53/6. New compared with 02/25/2019. Small round solid nodule in the superior segment of right lower lobe measures 6 mm, image 50/6. New from previous exam. Within the right upper lobe there is a 5 mm lung nodule, image 28/6. New from previous exam. Upper Abdomen: Bilateral kidney cysts are again noted. The largest arises from the upper pole of right kidney measuring 5.5 cm, image 90/4. Aortic atherosclerosis noted. Prominent gastrohepatic ligament node measures 1.2 cm, image 90/4. Musculoskeletal: No acute osseous findings mild multilevel degenerative disc disease noted within the lower thoracic spine. Review of the MIP images confirms the above findings. IMPRESSION: 1. No evidence for acute pulmonary embolus. 2. Patchy area of atelectasis and airspace density is noted within the posterior left lung base. Findings  are nonspecific but are favored to reflect inflammatory/infectious process. 3. Scattered lung nodules, new from previous exam. The dominant nodule is in the superior segment of left lower lobe measuring 1.9 cm. Non-contrast chest CT at 3-6 months is recommended. If the nodules are stable at time of repeat CT, then future CT at 18-24 months (from today's scan) is considered optional for low-risk patients, but is recommended for high-risk patients. This recommendation follows the consensus statement: Guidelines for Management of Incidental Pulmonary Nodules Detected on CT Images: From the Fleischner Society 2017; Radiology 2017; 284:228-243. 4. Prominent mediastinal and hilar lymph nodes are again noted. These are nonspecific in the setting of pneumonia and may be reactive in etiology. Attention on follow-up imaging is advised. Aortic Atherosclerosis (ICD10-I70.0) and Emphysema (ICD10-J43.9). Electronically Signed   By: Signa Kellaylor  Stroud M.D.   On: 02/04/2021 06:54   DG Chest Portable 1 View  Result Date: 02/04/2021 CLINICAL DATA:  Shortness of breath and cough. EXAM: PORTABLE CHEST 1 VIEW COMPARISON:  01/21/2021 FINDINGS: Interstitial coarsening which appears bronchitic based on previous imaging. There is no edema, discrete consolidation, effusion, or pneumothorax. Normal heart size. Prior CABG and aortic valve replacement. IMPRESSION: Bronchitic markings without acute superimposed finding. Electronically Signed   By: Marnee SpringJonathon  Watts M.D.   On: 02/04/2021 04:45      ____________________________________________   PROCEDURES  Procedure(s) performed:yes .1-3 Lead EKG Interpretation Performed by: Nita SickleVeronese, [place], MD Authorized by: Nita SickleVeronese, [place], MD     Interpretation: abnormal     ECG rate assessment: tachycardic     Rhythm: sinus tachycardia     Conduction: abnormal     Critical Care performed:  Yes  CRITICAL CARE Performed by: Nita Sicklearolina Bulah Lurie  ?  Total critical care time: 40  min  Critical care time was exclusive of separately billable procedures and treating other patients.  Critical care was necessary to treat or prevent imminent or life-threatening deterioration.  Critical care was time spent personally by me on the following activities: development of treatment plan with patient and/or surrogate as well as nursing, discussions with consultants, evaluation of patient's response to treatment, examination of patient, obtaining history from patient or surrogate, ordering and performing treatments and interventions, ordering and review of laboratory studies, ordering and review of radiographic studies, pulse oximetry and re-evaluation of patient's condition.  ____________________________________________   INITIAL IMPRESSION / ASSESSMENT AND PLAN / ED COURSE  75 y.o. male with a history of recent COVID-19, diabetes, hypertension, hyperlipidemia, CHF, COPD, smoking, CAD status post CABG who presents for evaluation of shortness of breath which has been progressively worse since being discharged from the hospital 10 days ago.  Patient recently admitted for 14 days in the setting of COVID-19.  Review of discharge summary was done by me.  Patient arrives in moderate respiratory distress, tachypneic and hypoxic requiring nonrebreather after receiving 4 breathing treatments, Solu-Medrol and magnesium per EMS.  Patient was placed on BiPAP.  We will continue to treat with duo nebs since he still sounds tight on auscultation.  He looks euvolemic.  Differential diagnoses including COPD exacerbation, prolonged Covid symptoms, CHF exacerbation, pneumonia, myocarditis, pericardial effusion, PE.   Patient placed on telemetry for close cardiorespiratory monitoring.  Labs and chest x-ray are pending.  Anticipate admission  _________________________ 5:08 AM on 02/04/2021 -----------------------------------------  Patient's respiratory status improving on BiPAP. Chest x-ray showing no  acute findings, visualized by me confirmed by radiology. Labs with no significant abnormalities, no signs of sepsis. Initial troponin is at baseline. BNP and procalcitonin are pending. Will consult hospitalist for admission.   _________________________ 7:00 AM on 02/04/2021 -----------------------------------------  Negative procalcitonin.  Will hold off antibiotics    _____________________________________________ Please note:  Patient was evaluated in Emergency Department today for the symptoms described in the history of present illness. Patient was evaluated in the context of the global COVID-19 pandemic, which necessitated consideration that the patient might be at risk for infection with the SARS-CoV-2 virus that causes COVID-19. Institutional protocols and algorithms that pertain to the evaluation of patients at risk for COVID-19 are in a state of rapid change based on information released by regulatory bodies including the CDC and federal and state organizations. These policies and algorithms were followed during the patient's care in the ED.  Some ED evaluations and interventions may be delayed as a result of limited staffing during the pandemic.   [place] Controlled Substance Database was reviewed by me. ____________________________________________   FINAL CLINICAL IMPRESSION(S) / ED DIAGNOSES   Final diagnoses:  Acute on chronic respiratory failure with hypoxia (HCC)      NEW MEDICATIONS STARTED DURING THIS VISIT:  ED Discharge Orders    None       Note:  This document was prepared using Dragon voice recognition software and may include unintentional dictation errors.    Don Perking, [place], MD 02/04/21 0700

## 2021-02-04 NOTE — H&P (Addendum)
History and Physical    Harold Parker IFO:[id number] DOB: 1946/11/30 DOA: 02/04/2021  PCP: Merlene Laughter, MD   Patient coming from: home  I have personally briefly reviewed patient's old medical records in [facility]  Chief Complaint: Shortness of breath  HPI: Harold Parker is a 75 y.o. male with medical history significant for CAD s/p CABG x3, bioprosthetic AVR 2019, HTN, COPD on home O2 previously at 2 L, now on 4 L following a hospitalization from 1/26-2/8 for Covid pneumonia, anxiety, history of bleeding duodenal ulcer, systolic heart failure, last EF 45 to 50%, who presents with continued and worsening shortness of breath since his discharge on 01/24/2021.  Patient states he felt like he was not back to baseline at discharge and his dyspnea has progressively improved and got acutely worse on the night of admission.  He denies chest pain, fever or chills.  Denies nausea vomiting or diarrhea.  Denies palpitations.  EMS reports O2 sats in the mid 80s on home flow rate of 4 L oxygen ED Course: Upon arrival, afebrile, tachycardic at 127, tachypneic at 21-33 with O2 sat 87% on 4 L.  He was placed on nonrebreather at 10 L with sats in the mid 90s but due to continued tachypnea and increased work of breathing he was subsequently placed on BiPAP.  His blood work was for the most part unremarkable.  Troponin was 24, BNP 293, Down from a high of 591 during his hospitalization 3 weeks prior  EKG as reviewed by me : Sinus tachycardia at 128 with a right bundle branch block.  No acute ST-T wave changes Imaging: Chest x-ray showing bronchitic markings without acute superimposed findings  Patient received 2 DuoNeb treatments with EMS and received an additional 2 in the ER.  He continued to have an increased work of breathing.  Hospitalist consulted for admission.  Review of Systems: As per HPI otherwise all other systems on review of systems negative.    Past Medical History:  Diagnosis Date  .  Bilateral carotid bruits    a. 01/2018 U/S: < 50% bilat ICA stenosis.  Marland Kitchen CAD (coronary artery disease)    a. 1998 s/p MI and BMS [facility], [place]); b. 1999 redo PCI/rotablator in setting of what sounds like ISR;  c. Multiple stress tests over the years - last ~ 2017, reportedly nl; d. 01/2018 NSTEMI/Cath: LM 27m/d, LAD 50p, 40p/m, D1 60ost, OM1 95, RCA 100ost/p w/ L->R collats, EF 45%; e. s/p 3V CABG 02/19/18 (LIMA-LAD, VG-D1, VG-OM)  . Chronic lower back pain   . COPD (chronic obstructive pulmonary disease) (HCC)   . GIB (gastrointestinal bleeding)    a. 02/2018 GIB and anemia w/ Hgb of 4.7 on presentation; b. 03/2018 EGD: 2 nonbleeding duodenal ulcers.  Marland Kitchen HTN (hypertension)   . Hypercholesteremia   . Ischemic cardiomyopathy    a. 01/2018 Echo: EF 40-45%, mid-apicalanteroseptal, ant, apical sev HK, mod apicalinferior HK. Gr2 DD. Mod AS, mild MR, mod dil LA, PASP .  . Moderate aortic stenosis    a. 01/2018 Echo: Mod AS, mean grad (S) , Valve area (VTI) 1.06 cm^2, (Vmax) 1.27 cm^2; b. 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  . Myocardial infarction (HCC) ~ 1998/1999  . 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 02/19/2018 AVR: 25 mm Edwards Inspiris Resilia stented bovine pericardial tissue valve  . 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  Tobacco abuse   . Type II diabetes mellitus (HCC)     Past Surgical History:  Procedure Laterality Date  . ABDOMINAL AORTOGRAM N/A 03/04/2019   Procedure: ABDOMINAL AORTOGRAM;  Surgeon: Iran Ouch, MD;  Location: [facility] INV[facility]IVE CV LAB;  Service: Cardiovascular;  Laterality: N/A;  . AORTIC VALVE REPLACEMENT N/A 02/19/2018   Procedure: AORTIC VALVE REPLACEMENT (AVR);  Surgeon: Purcell Nails, MD;  Location[facility] OR;  Service: Open Heart Surgery;  Laterality: N/A;  . COLONOSCOPY    . CORONARY ANGIOPLASTY WITH STENT PLACEMENT  ~ 1998/1999  . CORONARY ARTERY BYPASS GRAFT N/A 02/19/2018    Procedure: CORONARY ARTERY BYPASS GRAFTING (CABG) x three , using left internal mammary artery and right leg greater saphenous vein harvested endoscopically;  Surgeon: Purcell Nails, MD;  Location[facility] OR;  Service: Open Heart Surgery;  Laterality: N/A;  . ESOPHAGOGASTRODUODENOSCOPY (EGD) WITH PROPOFOL N/A 03/18/2018   Procedure: ESOPHAGOGASTRODUODENOSCOPY (EGD) WITH PROPOFOL;  Surgeon: Midge Minium, MD;  Location: [facility] ENDOSCOPY;  Service: Endoscopy;  Laterality: N/A;  . LEFT HEART CATH AND CORONARY ANGIOGRAPHY N/A 02/06/2018   Procedure: LEFT HEART CATH AND CORONARY ANGIOGRAPHY;  Surgeon: Iran Ouch, MD;  Location: [facility] INVASIVE CV LAB;  Service: Cardiovascular;  Laterality: N/A;  . LOWER EXTREMITY ANGIOGRAPHY Bilateral 03/04/2019   Procedure: Lower Extremity Angiography;  Surgeon: Iran Ouch, MD;  Location: [facility] INVASIVE CV LAB;  Service: Cardiovascular;  Laterality: Bilateral;  . PERIPHERAL VASCULAR ATHERECTOMY Left 03/04/2019   Procedure: PERIPHERAL VASCULAR ATHERECTOMY;  Surgeon: Iran Ouch, MD;  Location: [facility]VASIVE CV LAB;  Service: Cardiovascular;  Laterality: Left;  SFA  . TEE WITHOUT CARDIOVERSION N/A 02/19/2018   Procedure: TRANSESOPHAGEAL ECHOCARDIOGRAM (TEE);  Surgeon: Purcell Nails, MD;  Location: [facility] OR;  Service: Open Heart Surgery;  Laterality: N/A;  . TONSILLECTOMY       reports that he quit smoking about 3 years ago. His smoking use included cigarettes. He has a 40.00 pack-year smoking history. He has never used smokeless tobacco. He reports current alcohol use. He reports that he does not use drugs.  Allergies  Allergen Reactions  . Atorvastatin Other (See Comments)    Leg aches and weakness     Family History  Problem Relation Age of Onset  . Lymphoma Mother   . Peripheral vascular disease Father       Prior to Admission medications   Medication Sig Start Date End Date Taking? Authorizing Provider  acetaminophen (TYLENOL) 500 MG tablet Take 1,000  mg by mouth daily as needed for moderate pain or headache.   Yes [provider]  ALPRAZolam Prudy Feeler) 0.5 MG tablet Take 0.5 mg by mouth 2 (two) times daily.   Yes [provider]  amLODipine (NORVASC) 5 MG tablet Take 5 mg by mouth daily.   Yes [provider]  Ascorbic Acid (VITAMIN C) 1000 MG tablet Take 1,000 mg by mouth daily.   Yes [provider]  aspirin EC 81 MG tablet Take 81 mg by mouth daily.   Yes [provider]  esomeprazole (NEXIUM) 40 MG capsule Take 40 mg by mouth at bedtime.    Yes [provider]  ezetimibe (ZETIA) 10 MG tablet Take 10 mg by mouth at bedtime.    Yes [provider]  folic acid (FOLVITE) 400 MCG tablet Take 400 mcg by mouth every evening.   Yes [provider]  furosemide (LASIX) 40 MG tablet TAKE 1 TABLET BY MOUTH EVERY DAY 12/19/20  Yes  Iran Ouch, MD  PROAIR HFA 108 [phone] Base) MCG/ACT inhaler Inhale 1 puff into the lungs every 4 (four) hours as needed for wheezing or shortness of breath. 02/04/19  Yes [provider]  atorvastatin (LIPITOR) 80 MG tablet Take 80 mg by mouth daily. 01/30/21   [provider]  BREZTRI AEROSPHERE 160-9-4.8 MCG/ACT AERO TAKE 2 PUFFS BY MOUTH TWICE A DAY (STOP TRELEGY) 12/06/20   Salena Saner, MD  busPIRone (BUSPAR) 5 MG tablet Take 5 mg by mouth 2 (two) times daily. 02/01/21   [provider]  glipiZIDE (GLUCOTROL) 5 MG tablet Take 0.5 tablets (2.5 mg total) by mouth daily before breakfast. Patient taking differently: Take 5 mg by mouth See admin instructions. Take 5 mg in the morning and take a second 5 mg dose at night on Mon, Wed, and Fri 02/27/18   Conte, Tessa N, PA-C  ipratropium-albuterol (DUONEB) 0.5-2.5 (3) MG/3ML SOLN Take 3 mLs by nebulization every 6 (six) hours as needed. Patient taking differently: Take 3 mLs by nebulization every 6 (six) hours as needed (shortness of breath).  02/03/18   Emily Filbert, MD   losartan (COZAAR) 100 MG tablet TAKE 1 TABLET BY MOUTH EVERY DAY Please call to schedule appointment for further refills. Thank you! Patient taking differently: 50 mg. TAKE 1 TABLET BY MOUTH EVERY DAY Please call to schedule appointment for further refills. Thank you! 01/25/20   Iran Ouch, MD  losartan (COZAAR) 50 MG tablet Take 50 mg by mouth daily. 01/29/21   [provider]  Melatonin 10 MG CAPS Take 10 mg by mouth at bedtime.    [provider]  metFORMIN (GLUCOPHAGE) 1000 MG tablet TAKE 1 TABLET BY MOUTH TWICE DAILY WITH A MEAL Patient taking differently: Take 1,000 mg by mouth 2 (two) times daily. 04/27/18   Kerin Perna, MD  metoprolol succinate (TOPROL-XL) 100 MG 24 hr tablet TAKE 1 TABLET BY MOUTH EVERY DAY WITH OR IMMEDIATELY FOLLOWING A MEAL 09/25/19   Iran Ouch, MD  metoprolol tartrate (LOPRESSOR) 100 MG tablet Take 100 mg by mouth daily. 01/30/21   [provider]  predniSONE (DELTASONE) 10 MG tablet Prednisone 40 mg po daily x 1 day then Prednisone 30 mg po daily x 1 day then Prednisone 20 mg po daily x 1 day then Prednisone 10 mg daily x 1 day then stop... 01/25/21   Meredeth Ide, MD  sucralfate (CARAFATE) 1 GM/10ML suspension Take 10 mLs (1 g total) by mouth 4 (four) times daily -  with meals and at bedtime. 01/24/21   Meredeth Ide, MD  Vitamin D, Cholecalciferol, 1000 units TABS Take 1,000 Units by mouth every morning.     [provider]    Physical Exam: Vitals:   02/04/21 0407 02/04/21 0410 02/04/21 0420 02/04/21 0430  BP:    (!) 105/48  Pulse:  (!) 125 (!) 122 (!) 120  Resp:  (!) 22 (!) 26 (!) 27  Temp:      TempSrc:      SpO2: 95% 96% 97% 100%  Weight:      Height:         Vitals:   02/04/21 0407 02/04/21 0410 02/04/21 0420 02/04/21 0430  BP:    (!) 105/48  Pulse:  (!) 125 (!) 122 (!) 120  Resp:  (!) 22 (!) 26 (!) 27  Temp:      TempSrc:      SpO2: 95% 96% 97% 100%  Weight:      Height:           Constitutional: Alert and oriented x 3 .  Moderate respiratory distress  HEENT:      Head: Normocephalic and atraumatic.         Eyes: PERLA, EOMI, Conjunctivae are normal. Sclera is non-icteric.       Mouth/Throat: Mucous membranes are moist.       Neck: Supple with no signs of meningismus. Cardiovascular:  Tachycardic. No murmurs, gallops, or rubs. 2+ symmetrical distal pulses are present . No JVD. No LE edema Respiratory: Respiratory effort increased.  Unable to speak in complete sentences lungs sounds diminished bilaterally. No wheezes, crackles, or rhonchi.  Gastrointestinal: Soft, non tender, and non distended with positive bowel sounds.  Genitourinary: No CVA tenderness. Musculoskeletal: Nontender with normal range of motion in all extremities. No cyanosis, or erythema of extremities. Neurologic:  Face is symmetric. Moving all extremities. No gross focal neurologic deficits . Skin: Skin is warm, dry.  No rash or ulcers Psychiatric: Mood and affect are anxious   Labs on Admission: I have personally reviewed following labs and imaging studies  CBC: Recent Labs  Lab 02/04/21 0406  WBC 9.0  NEUTROABS 6.1  HGB 13.2  HCT 38.5*  MCV 89.5  PLT 148*   Basic Metabolic Panel: Recent Labs  Lab 02/04/21 0406  NA 131*  K 3.8  CL 99  CO2 22  GLUCOSE 223*  BUN 16  CREATININE 0.97  CALCIUM 8.2*   GFR: Estimated Creatinine Clearance: 62.8 mL/min (by C-G formula based on SCr of 0.97 mg/dL). Liver Function Tests: Recent Labs  Lab 02/04/21 0406  AST 22  ALT 34  ALKPHOS 66  BILITOT 1.2  PROT 6.5  ALBUMIN 2.8*   No results for input(s): LIPASE, AMYLASE in the last 168 hours. No results for input(s): AMMONIA in the last 168 hours. Coagulation Profile: No results for input(s): INR, PROTIME in the last 168 hours. Cardiac Enzymes: No results for input(s): CKTOTAL, CKMB, CKMBINDEX, TROPONINI in the last 168 hours. BNP (last 3 results) No results for input(s): PROBNP  in the last 8760 hours. HbA1C: No results for input(s): HGBA1C in the last 72 hours. CBG: No results for input(s): GLUCAP in the last 168 hours. Lipid Profile: No results for input(s): CHOL, HDL, LDLCALC, TRIG, CHOLHDL, LDLDIRECT in the last 72 hours. Thyroid Function Tests: No results for input(s): TSH, T4TOTAL, FREET4, T3FREE, THYROIDAB in the last 72 hours. Anemia Panel: No results for input(s): VITAMINB12, FOLATE, FERRITIN, TIBC, IRON, RETICCTPCT in the last 72 hours. Urine analysis: No results found for: COLORURINE, APPEARANCEUR, LABSPEC, PHURINE, GLUCOSEU, HGBUR, BILIRUBINUR, KETONESUR, PROTEINUR, UROBILINOGEN, NITRITE, LEUKOCYTESUR  Radiological Exams on Admission: DG Chest Portable 1 View  Result Date: 02/04/2021 CLINICAL DATA:  Shortness of breath and cough. EXAM: PORTABLE CHEST 1 VIEW COMPARISON:  01/21/2021 FINDINGS: Interstitial coarsening which appears bronchitic based on previous imaging. There is no edema, discrete consolidation, effusion, or pneumothorax. Normal heart size. Prior CABG and aortic valve replacement. IMPRESSION: Bronchitic markings without acute superimposed finding. Electronically Signed   By: Marnee Spring M.D.   On: 02/04/2021 04:45     Assessment/Plan 75 year old male with history of CAD s/p CABG x3, bioprosthetic AVR 2019, HTN, COPD on home O2 previously at 2 L, now on 4 L following a hospitalization from 1/26-2/8 for Covid pneumonia, anxiety, history of bleeding duodenal ulcer, systolic heart failure, last EF 45 to 50%, who presents with continued and worsening shortness of breath since  his discharge on 01/24/2021.     Acute on chronic respiratory failure with hypoxia (HCC)   History of COVID-19 pneumonia 01/11/21 -Etiology believed primarily related to physical deconditioning in the context of recent COVID-19 pneumonia with worsening of chronic respiratory failure/long COVID -Patient presenting with increased work of breathing, unable to speak in  complete sentences, O2 sat 87% on home flow rate of 4 L with persistent tachypnea with nonrebreather leading to being placed on BiPAP -Other etiologies for consideration include COPD exacerbation, CHF, PE -If no improvement, can consider ruling out for PE.  CXR not suggestive of CHF or bacterial pneumonia.  BNP improved from recent past admission -Supplemental oxygen to keep sats over 90 to 92% with BiPAP if needed -DuoNebs as needed, antitussives as needed -We will get CTA chest given no other definite etiology for his severe hypoxia -Consider respiratory therapy consult for pulmonary rehab as patient appears very anxious which might be contributing to increased work of breathing  Elevated troponin CAD (coronary artery disease)   S/P CABG x 3 -No complaints of chest pain.  EKG is nonacute -Mild elevation related to demand ischemia.  Trend is flat compared to recent past admission -Continue aspirin, metoprolol, atorvastatin    Chronic systolic CHF (congestive heart failure) (HCC)   Ischemic cardiomyopathy -Appears euvolemic.  BNP improved at 200 from a high of 591 3 weeks prior -Last EF 40 to 45% -Continue losartan, metoprolol, Lasix -Daily weights    Type II diabetes mellitus with renal manifestations (HCC) -Sliding scale insulin coverage    Stage 3 severe COPD by GOLD classification (HCC) -Possible exacerbation  -Continue home inhalers -DuoNebs as needed    S/P aortic valve replacement with bioprosthetic valve -No acute disease    Anxiety -Continue home buspirone -Patient appears very anxious which might be contributing to his symptoms     DVT prophylaxis: Lovenox  Code Status: full code  Family Communication:  none  Disposition Plan: Back to previous home environment Consults called: none  Status:At the time of admission, it appears that the appropriate admission status for this patient is INPATIENT. This is judged to be reasonable and necessary in order to provide the  required intensity of service to ensure the patient's safety given the presenting symptoms, physical exam findings, and initial radiographic and laboratory data in the context of their  Comorbid conditions.   Patient requires inpatient status due to high intensity of service, high risk for further deterioration and high frequency of surveillance required.   I certify that at the point of admission it is my clinical judgment that the patient will require inpatient hospital care spanning beyond 2 midnights     Andris BaumannHazel V Nicklos Gaxiola MD [facility]     02/04/2021, 5:32 AM

## 2021-02-04 NOTE — ED Notes (Addendum)
Pt tolerating bipap well. Work of breathing improved. Dr. Don Perking at bedside. Significant other sitting with patient.

## 2021-02-05 ENCOUNTER — Inpatient Hospital Stay (HOSPITAL_COMMUNITY)
Admit: 2021-02-05 | Discharge: 2021-02-05 | Disposition: A | Discharge status: Home / Self Care | Payer: Medicare Other | Attending: Pulmonary Disease | Admitting: Pulmonary Disease

## 2021-02-05 DIAGNOSIS — N179 Acute kidney failure, unspecified: Secondary | ICD-10-CM

## 2021-02-05 DIAGNOSIS — I255 Ischemic cardiomyopathy: Secondary | ICD-10-CM

## 2021-02-05 DIAGNOSIS — G4700 Insomnia, unspecified: Secondary | ICD-10-CM

## 2021-02-05 LAB — BASIC METABOLIC PANEL
Anion gap: 8 (ref 5–15)
BUN: 38 mg/dL — ABNORMAL HIGH (ref 8–23)
CO2: 24 mmol/L (ref 22–32)
Calcium: 9.2 mg/dL (ref 8.9–10.3)
Chloride: 100 mmol/L (ref 98–111)
Creatinine, Ser: 1.32 mg/dL — ABNORMAL HIGH (ref 0.61–1.24)
GFR, Estimated: 56 mL/min — ABNORMAL LOW (ref >60)
Glucose, Bld: 343 mg/dL — ABNORMAL HIGH (ref 70–99)
Potassium: 4.5 mmol/L (ref 3.5–5.1)
Sodium: 132 mmol/L — ABNORMAL LOW (ref 135–145)

## 2021-02-05 LAB — CBC
HCT: 34.2 % — ABNORMAL LOW (ref 39.0–52.0)
Hemoglobin: 11.9 g/dL — ABNORMAL LOW (ref 13.0–17.0)
MCH: 30.7 pg (ref 26.0–34.0)
MCHC: 34.8 g/dL (ref 30.0–36.0)
MCV: 88.4 fL (ref 80.0–100.0)
Platelets: 191 10*3/uL (ref 150–400)
RBC: 3.87 MIL/uL — ABNORMAL LOW (ref 4.22–5.81)
RDW: 13.8 % (ref 11.5–15.5)
WBC: 9.7 10*3/uL (ref 4.0–10.5)
nRBC: 0 % (ref 0.0–0.2)

## 2021-02-05 LAB — ECHOCARDIOGRAM COMPLETE
AR max vel: 1.03 cm2
AV Peak grad: 11.8 mmHg
Ao pk vel: 1.72 m/s
Area-P 1/2: 4.63 cm2
Height: 66 in
S' Lateral: 3.36 cm
Weight: 2793.6 oz

## 2021-02-05 LAB — MAGNESIUM: Magnesium: 2.3 mg/dL (ref 1.7–2.4)

## 2021-02-05 LAB — GLUCOSE, CAPILLARY
Glucose-Capillary: 340 mg/dL — ABNORMAL HIGH (ref 70–99)
Glucose-Capillary: 185 mg/dL — ABNORMAL HIGH (ref 70–99)
Glucose-Capillary: 132 mg/dL — ABNORMAL HIGH (ref 70–99)
Glucose-Capillary: 111 mg/dL — ABNORMAL HIGH (ref 70–99)

## 2021-02-05 LAB — PHOSPHORUS: Phosphorus: 4 mg/dL (ref 2.5–4.6)

## 2021-02-05 MED ORDER — GLIPIZIDE ER 10 MG PO TB24
10.0000 mg | ORAL_TABLET | Freq: Every day | ORAL | Status: DC
  Administered 2021-02-06 – 2021-02-09 (×4): 10 mg via ORAL
  Filled 2021-02-05 (×4): qty 1

## 2021-02-05 MED ORDER — GLIPIZIDE ER 5 MG PO TB24: 5.0000 mg | ORAL_TABLET | Freq: Two times a day (BID) | ORAL | Status: DC

## 2021-02-05 MED ORDER — TRAZODONE HCL 50 MG PO TABS
50.0000 mg | ORAL_TABLET | Freq: Every day | ORAL | Status: DC
  Administered 2021-02-05 – 2021-02-08 (×4): 50 mg via ORAL
  Filled 2021-02-05 (×4): qty 1

## 2021-02-05 NOTE — Progress Notes (Signed)
Follow-up: Persistent shortness of breath after COVID-19 pneumonia Referring Physician: Fidela Juneau, MD  Harold Parker is an 75 y.o. male.  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  He has been on oxygen at 2 L/min chronically.  Initially started using oxygen at nighttime but most recently had been on 24/7.  Admitted to [facility] from 01/11/2021 through 01/24/2021.  At that time he was noted to have acute hypoxemic respiratory failure due to COVID-19 he was discharged home 4 L/min nasal cannula O2.  He notes that he has desaturations to the mid 80s on activity on this flow rate.  Patient was evaluated yesterday.  Changes to his nebulizer therapy were done and he finds that these have been very helpful, he notes his dyspnea is markedly better.  He also took trazodone last night and notes that this helped him sleep well and feels rested today.  He also has issues with anxiety and this has helped him.  Allergies:       Allergies  Allergen Reactions  . Atorvastatin Other (See Comments)    Leg aches and weakness    Objective:  Medications:  Scheduled Meds: . ALPRAZolam  0.5 mg Oral BID  . amLODipine  5 mg Oral Daily  . arformoterol  15 mcg Nebulization BID  . aspirin EC  81 mg Oral Daily  . budesonide (PULMICORT) nebulizer solution  0.5 mg Nebulization BID  . busPIRone  5 mg Oral BID  . cholecalciferol  1,000 Units Oral BH-q7a  . enoxaparin (LOVENOX) injection  40 mg Subcutaneous Q24H  . ezetimibe  10 mg Oral QHS  . folic acid  500 mcg Oral QPM  . [START ON 02/06/2021] glipiZIDE  10 mg Oral Q breakfast  . insulin aspart  0-15 Units Subcutaneous TID WC  . insulin aspart  0-5 Units Subcutaneous QHS  . insulin glargine  10 Units Subcutaneous Daily  . lidocaine  1 patch Transdermal Q24H  . losartan  100 mg Oral Daily  . melatonin   10 mg Oral QHS  . metoprolol succinate  100 mg Oral Daily  . pantoprazole  40 mg Oral Daily  . revefenacin  175 mcg Nebulization Daily  . sucralfate  1 g Oral TID WC & HS  . traZODone  50 mg Oral QHS   Continuous Infusions: PRN Meds:.acetaminophen **OR** acetaminophen, albuterol, guaiFENesin-dextromethorphan, ondansetron **OR** ondansetron (ZOFRAN) IV    Lab Results Last 48 Hours        Results for orders placed or performed during the hospital encounter of 02/04/21 (from the past 48 hour(s))  Brain natriuretic peptide     Status: Abnormal   Collection Time: 02/04/21  4:06 AM  Result Value Ref Range   B Natriuretic Peptide 293.2 (H) 0.0 - 100.0 pg/mL    Comment: Performed at [facility], [address] [place], [place] [address]  Troponin I (High Sensitivity)     Status: Abnormal   Collection Time: 02/04/21  4:06 AM  Result Value Ref Range   Troponin I (High Sensitivity) 24 (H) <18 ng/L    Comment: (NOTE) Elevated high sensitivity troponin I (hsTnI) values and significant  changes across serial measurements may suggest ACS but many other  chronic and acute conditions are known to elevate hsTnI results.  Refer to the "Links" section for chest pain algorithms and additional  guidance. Performed at  [facility], [address] [place], [place] [address]   CBC with Differential     Status: Abnormal   Collection Time: 02/04/21  4:06 AM  Result Value Ref Range   WBC 9.0 4.0 - 10.5 K/uL   RBC 4.30 4.22 - 5.81 MIL/uL   Hemoglobin 13.2 13.0 - 17.0 g/dL   HCT 39.7 (L) 67.3 - 41.9 %   MCV 89.5 80.0 - 100.0 fL   MCH 30.7 26.0 - 34.0 pg   MCHC 34.3 30.0 - 36.0 g/dL   RDW 37.9 02.4 - 09.7 %   Platelets 148 (L) 150 - 400 K/uL   nRBC 0.0 0.0 - 0.2 %   Neutrophils Relative % 69 %   Neutro Abs 6.1 1.7 - 7.7 K/uL   Lymphocytes Relative 20 %   Lymphs Abs 1.8 0.7 - 4.0 K/uL   Monocytes Relative 9 %   Monocytes Absolute 0.8 0.1 - 1.0  K/uL   Eosinophils Relative 2 %   Eosinophils Absolute 0.2 0.0 - 0.5 K/uL   Basophils Relative 0 %   Basophils Absolute 0.0 0.0 - 0.1 K/uL   Immature Granulocytes 0 %   Abs Immature Granulocytes 0.04 0.00 - 0.07 K/uL    Comment: Performed at [facility], [address] [place], [place] [address]  Comprehensive metabolic panel     Status: Abnormal   Collection Time: 02/04/21  4:06 AM  Result Value Ref Range   Sodium 131 (L) 135 - 145 mmol/L   Potassium 3.8 3.5 - 5.1 mmol/L   Chloride 99 98 - 111 mmol/L   CO2 22 22 - 32 mmol/L   Glucose, Bld 223 (H) 70 - 99 mg/dL    Comment: Glucose reference range applies only to samples taken after fasting for at least 8 hours.   BUN 16 8 - 23 mg/dL   Creatinine, Ser 9.24 0.61 - 1.24 mg/dL   Calcium 8.2 (L) 8.9 - 10.3 mg/dL   Total Protein 6.5 6.5 - 8.1 g/dL   Albumin 2.8 (L) 3.5 - 5.0 g/dL   AST 22 15 - 41 U/L   ALT 34 0 - 44 U/L   Alkaline Phosphatase 66 38 - 126 U/L   Total Bilirubin 1.2 0.3 - 1.2 mg/dL   GFR, Estimated >26 >83 mL/min    Comment: (NOTE) Calculated using the CKD-EPI Creatinine Equation (2021)    Anion gap 10 5 - 15    Comment: Performed at [facility], [address] [place], [place] [address]  Procalcitonin - Baseline     Status: None   Collection Time: 02/04/21  4:06 AM  Result Value Ref Range   Procalcitonin <0.10 ng/mL    Comment:        Interpretation: PCT (Procalcitonin) <= 0.5 ng/mL: Systemic infection (sepsis) is not likely. Local bacterial infection is possible. (NOTE)       Sepsis PCT Algorithm           Lower Respiratory Tract                                      Infection PCT Algorithm    ----------------------------     ----------------------------         PCT < 0.25 ng/mL                PCT < 0.10 ng/mL  Strongly encourage             Strongly discourage   discontinuation of antibiotics    initiation of antibiotics     ----------------------------     -----------------------------       PCT 0.25 - 0.50 ng/mL            PCT 0.10 - 0.25 ng/mL               OR       >80% decrease in PCT            Discourage initiation of                                            antibiotics      Encourage discontinuation           of antibiotics    ----------------------------     -----------------------------         PCT >= 0.50 ng/mL              PCT 0.26 - 0.50 ng/mL               AND        <80% decrease in PCT             Encourage initiation of                                             antibiotics       Encourage continuation           of antibiotics    ----------------------------     -----------------------------        PCT >= 0.50 ng/mL                  PCT > 0.50 ng/mL               AND         increase in PCT                  Strongly encourage                                      initiation of antibiotics    Strongly encourage escalation           of antibiotics                                     -----------------------------                                           PCT <= 0.25 ng/mL                                                 OR                                        >  80% decrease in PCT                                      Discontinue / Do not initiate                                             antibiotics  Performed at [facility], [address] [place], [place] [address]   Troponin I (High Sensitivity)     Status: Abnormal   Collection Time: 02/04/21  9:35 AM  Result Value Ref Range   Troponin I (High Sensitivity) 79 (H) <18 ng/L    Comment: READ BACK AND VERIFIED WITH JESSICA CHRISTMAS 02/04/21 1026 [facility] (NOTE) Elevated high sensitivity troponin I (hsTnI) values and significant  changes across serial measurements may suggest ACS but many other  chronic and acute conditions are known to elevate hsTnI results.  Refer to the "Links" section for chest pain  algorithms and additional  guidance. Performed at [facility], [address] [place], [place] [address]   Glucose, capillary     Status: Abnormal   Collection Time: 02/04/21 12:40 PM  Result Value Ref Range   Glucose-Capillary 511 (HH) 70 - 99 mg/dL    Comment: Glucose reference range applies only to samples taken after fasting for at least 8 hours.   Comment 1 Notify RN   TSH     Status: None   Collection Time: 02/04/21  1:05 PM  Result Value Ref Range   TSH 0.627 0.350 - 4.500 uIU/mL    Comment: Performed by a 3rd Generation assay with a functional sensitivity of <=0.01 uIU/mL. Performed at [facility], [address] [place], [place] [address]   Magnesium     Status: None   Collection Time: 02/04/21  1:05 PM  Result Value Ref Range   Magnesium 2.0 1.7 - 2.4 mg/dL    Comment: Performed at [facility], [address] [place], [place] [address]  Glucose, capillary     Status: Abnormal   Collection Time: 02/04/21  4:15 PM  Result Value Ref Range   Glucose-Capillary 355 (H) 70 - 99 mg/dL    Comment: Glucose reference range applies only to samples taken after fasting for at least 8 hours.  Basic metabolic panel     Status: Abnormal   Collection Time: 02/05/21  4:50 AM  Result Value Ref Range   Sodium 132 (L) 135 - 145 mmol/L   Potassium 4.5 3.5 - 5.1 mmol/L   Chloride 100 98 - 111 mmol/L   CO2 24 22 - 32 mmol/L   Glucose, Bld 343 (H) 70 - 99 mg/dL    Comment: Glucose reference range applies only to samples taken after fasting for at least 8 hours.   BUN 38 (H) 8 - 23 mg/dL   Creatinine, Ser 3.79 (H) 0.61 - 1.24 mg/dL   Calcium 9.2 8.9 - 02.4 mg/dL   GFR, Estimated 56 (L) >60 mL/min    Comment: (NOTE) Calculated using the CKD-EPI Creatinine Equation (2021)    Anion gap 8 5 - 15    Comment: Performed at [facility], [address] [place], [place] [address]  CBC     Status: Abnormal    Collection Time: 02/05/21  4:50 AM  Result Value  Ref Range   WBC 9.7 4.0 - 10.5 K/uL   RBC 3.87 (L) 4.22 - 5.81 MIL/uL   Hemoglobin 11.9 (L) 13.0 - 17.0 g/dL   HCT 16.1 (L) 09.6 - 04.5 %   MCV 88.4 80.0 - 100.0 fL   MCH 30.7 26.0 - 34.0 pg   MCHC 34.8 30.0 - 36.0 g/dL   RDW 40.9 81.1 - 91.4 %   Platelets 191 150 - 400 K/uL   nRBC 0.0 0.0 - 0.2 %    Comment: Performed at [facility], [address], [place], [place] [address]  Magnesium     Status: None   Collection Time: 02/05/21  4:50 AM  Result Value Ref Range   Magnesium 2.3 1.7 - 2.4 mg/dL    Comment: Performed at [facility], [address], [place], [place] [address]  Phosphorus     Status: None   Collection Time: 02/05/21  4:50 AM  Result Value Ref Range   Phosphorus 4.0 2.5 - 4.6 mg/dL    Comment: Performed at [facility], [address] [place], [place] [address]  Glucose, capillary     Status: Abnormal   Collection Time: 02/05/21  7:48 AM  Result Value Ref Range   Glucose-Capillary 340 (H) 70 - 99 mg/dL    Comment: Glucose reference range applies only to samples taken after fasting for at least 8 hours.  Glucose, capillary     Status: Abnormal   Collection Time: 02/05/21 11:17 AM  Result Value Ref Range   Glucose-Capillary 185 (H) 70 - 99 mg/dL    Comment: Glucose reference range applies only to samples taken after fasting for at least 8 hours.  Glucose, capillary     Status: Abnormal   Collection Time: 02/05/21  4:51 PM  Result Value Ref Range   Glucose-Capillary 132 (H) 70 - 99 mg/dL    Comment: Glucose reference range applies only to samples taken after fasting for at least 8 hours.       Imaging Results (Last 48 hours)  CT ANGIO CHEST PE W OR WO CONTRAST  Result Date: 02/04/2021 CLINICAL DATA:  Rule out pulmonary embolus.  High probability. EXAM: CT ANGIOGRAPHY CHEST WITH CONTRAST TECHNIQUE: Multidetector CT imaging of the chest was  performed using the standard protocol during bolus administration of intravenous contrast. Multiplanar CT image reconstructions and MIPs were obtained to evaluate the vascular anatomy. CONTRAST:  75mL OMNIPAQUE IOHEXOL 350 MG/ML SOLN COMPARISON:  02/25/2019 FINDINGS: Cardiovascular: Mild scratch set heart size upper limits of normal. Previous median sternotomy and CABG procedure. Aortic atherosclerosis. The main pulmonary artery is patent. No lobar or segmental pulmonary artery filling defects. Mediastinum/Nodes: No enlarged axillary or supraclavicular lymph nodes. Prominent mediastinal lymph nodes are identified including 1.1 cm low right paratracheal lymph node, image 40/4. This is unchanged from previous exam. Left pre-vascular node measures 1.3 cm, image 38/4. Previously 1.1 cm. Subcarinal node measures 1.2 cm, image 48/4. Previously 0.8 cm. Prominent bilateral hilar lymphoid none of which meet CT criteria for adenopathy. Lungs/Pleura: Moderate changes of centrilobular emphysema. Patchy area of atelectasis and airspace density is noted within the posterior left lung base. Mild subsegmental atelectasis is noted in the subpleural right lung base. Patchy irregular nodular density within the superior segment of the left lower lobe measures 1.9 cm, image 47/6. New compared with 02/25/2019. Adjacent part solid nodule measures 1 cm, image 53/6. New compared with 02/25/2019. Small round solid nodule in the superior segment of right lower lobe measures  6 mm, image 50/6. New from previous exam. Within the right upper lobe there is a 5 mm lung nodule, image 28/6. New from previous exam. Upper Abdomen: Bilateral kidney cysts are again noted. The largest arises from the upper pole of right kidney measuring 5.5 cm, image 90/4. Aortic atherosclerosis noted. Prominent gastrohepatic ligament node measures 1.2 cm, image 90/4. Musculoskeletal: No acute osseous findings mild multilevel degenerative disc disease noted within the  lower thoracic spine. Review of the MIP images confirms the above findings. IMPRESSION: 1. No evidence for acute pulmonary embolus. 2. Patchy area of atelectasis and airspace density is noted within the posterior left lung base. Findings are nonspecific but are favored to reflect inflammatory/infectious process. 3. Scattered lung nodules, new from previous exam. The dominant nodule is in the superior segment of left lower lobe measuring 1.9 cm. Non-contrast chest CT at 3-6 months is recommended. If the nodules are stable at time of repeat CT, then future CT at 18-24 months (from today's scan) is considered optional for low-risk patients, but is recommended for high-risk patients. This recommendation follows the consensus statement: Guidelines for Management of Incidental Pulmonary Nodules Detected on CT Images: From the Fleischner Society 2017; Radiology 2017; 284:228-243. 4. Prominent mediastinal and hilar lymph nodes are again noted. These are nonspecific in the setting of pneumonia and may be reactive in etiology. Attention on follow-up imaging is advised. Aortic Atherosclerosis (ICD10-I70.0) and Emphysema (ICD10-J43.9). Electronically Signed   By: Signa Kell M.D.   On: 02/04/2021 06:54   DG Chest Portable 1 View  Result Date: 02/04/2021 CLINICAL DATA:  Shortness of breath and cough. EXAM: PORTABLE CHEST 1 VIEW COMPARISON:  01/21/2021 FINDINGS: Interstitial coarsening which appears bronchitic based on previous imaging. There is no edema, discrete consolidation, effusion, or pneumothorax. Normal heart size. Prior CABG and aortic valve replacement. IMPRESSION: Bronchitic markings without acute superimposed finding. Electronically Signed   By: Marnee Spring M.D.   On: 02/04/2021 04:45   ECHOCARDIOGRAM COMPLETE  Result Date: 02/05/2021    ECHOCARDIOGRAM REPORT   Patient Name:   Harold Parker Date of Exam: 02/05/2021 Medical Rec #:  [id number]     Height:       66.0 in Accession #:    [id number]     Weight:       174.6 lb Date of Birth:  01-May-1946     BSA:          1.888 m Patient Age:    74 years      BP:           120/49 mmHg Patient Gender: M             HR:           80 bpm. Exam Location:  [facility] Procedure: 2D Echo, Cardiac Doppler and Color Doppler Indications:     Cardiomyopathy-Ischemic I25.5  History:         Patient has prior history of Echocardiogram examinations. CAD;                  Risk Factors:Hypertension and Diabetes.  Sonographer:     Neysa Bonito Roar Referring Phys:  [id number] Tyriana Helmkamp Knox Saliva Diagnosing Phys: [name] MD IMPRESSIONS  1. Left ventricular ejection fraction, by estimation, is 40 to 45%. The left ventricle has mildly decreased function. Left ventricular endocardial border not optimally defined to evaluate regional wall motion. Left ventricular diastolic parameters are consistent with Grade I diastolic dysfunction (impaired relaxation).  2. Right ventricular systolic function is  normal. The right ventricular size is normal. There is moderately elevated pulmonary artery systolic pressure. The estimated right ventricular systolic pressure is 58.4 mmHg.  3. Left atrial size was mildly dilated.  4. The mitral valve is normal in structure. Mild mitral valve regurgitation. No evidence of mitral stenosis.  5. Tricuspid valve regurgitation is moderate.  6. The aortic valve was not well visualized. Aortic valve regurgitation is not visualized. No aortic stenosis is present.  7. The inferior vena cava is normal in size with greater than 50% respiratory variability, suggesting right atrial pressure of 3 mmHg. FINDINGS  Left Ventricle: Left ventricular ejection fraction, by estimation, is 40 to 45%. The left ventricle has mildly decreased function. Left ventricular endocardial border not optimally defined to evaluate regional wall motion. The left ventricular internal cavity size was normal in size. There is no left ventricular hypertrophy. Left ventricular diastolic parameters are consistent  with Grade I diastolic dysfunction (impaired relaxation). Right Ventricle: The right ventricular size is normal. No increase in right ventricular wall thickness. Right ventricular systolic function is normal. There is moderately elevated pulmonary artery systolic pressure. The tricuspid regurgitant velocity is 3.72 m/s, and with an assumed right atrial pressure of 3 mmHg, the estimated right ventricular systolic pressure is 58.4 mmHg. Left Atrium: Left atrial size was mildly dilated. Right Atrium: Right atrial size was normal in size. Pericardium: There is no evidence of pericardial effusion. Mitral Valve: The mitral valve is normal in structure. Mild mitral valve regurgitation. No evidence of mitral valve stenosis. Tricuspid Valve: The tricuspid valve is normal in structure. Tricuspid valve regurgitation is moderate . No evidence of tricuspid stenosis. Aortic Valve: The aortic valve was not well visualized. Aortic valve regurgitation is not visualized. No aortic stenosis is present. Aortic valve peak gradient measures 11.8 mmHg. Pulmonic Valve: The pulmonic valve was normal in structure. Pulmonic valve regurgitation is mild. No evidence of pulmonic stenosis. Aorta: The aortic root is normal in size and structure. Venous: The inferior vena cava is normal in size with greater than 50% respiratory variability, suggesting right atrial pressure of 3 mmHg. IAS/Shunts: No atrial level shunt detected by color flow Doppler.  LEFT VENTRICLE PLAX 2D LVIDd:         4.35 cm  Diastology LVIDs:         3.36 cm  LV e' medial:    4.12 cm/s LV PW:         1.10 cm  LV E/e' medial:  15.2 LV IVS:        1.00 cm  LV e' lateral:   6.66 cm/s LVOT diam:     1.70 cm  LV E/e' lateral: 9.4 LVOT Area:     2.27 cm  RIGHT VENTRICLE RV Mid diam:    4.01 cm RV S prime:     6.83 cm/s TAPSE (M-mode): 1.5 cm LEFT ATRIUM             Index       RIGHT ATRIUM           Index LA diam:        3.85 cm 2.04 cm/m  RA Area:     19.80 cm LA Vol (A2C):    69.0 ml 36.55 ml/m RA Volume:   63.60 ml  33.69 ml/m LA Vol (A4C):   49.8 ml 26.38 ml/m LA Biplane Vol: 59.9 ml 31.73 ml/m  AORTIC VALVE                PULMONIC  VALVE AV Area (Vmax): 1.03 cm    PV Vmax:        1.02 m/s AV Vmax:        172.00 cm/s PV Peak grad:   4.2 mmHg AV Peak Grad:   11.8 mmHg   RVOT Peak grad: 2 mmHg LVOT Vmax:      78.00 cm/s  AORTA Ao Root diam: 2.90 cm MITRAL VALVE               TRICUSPID VALVE MV Area (PHT): 4.63 cm    TR Peak grad:   55.4 mmHg MV Decel Time: 164 msec    TR Vmax:        372.00 cm/s MV E velocity: 62.60 cm/s MV A velocity: 91.70 cm/s  SHUNTS MV E/A ratio:  0.68        Systemic Diam: 1.70 cm MV A Prime:    11.4 cm/s Lorine BearsMuhammad Arida MD Electronically signed by Lorine BearsMuhammad Arida MD Signature Date/Time: 02/05/2021/1:50:38 PM    Final      Review of Systems  A 10 point review of systems was performed and it is as noted above otherwise negative.  Blood pressure (!) 112/58, pulse 68, temperature 97.7 F (36.5 C), resp. rate 19, height 5\' 6"  (1.676 m), weight 79.2 kg, SpO2 97 %.   Physical Exam GENERAL: Chronically ill-appearing man, well nourished, well developed.  Hoarse, (chronic).  No conversational dyspnea.  Comfortable with nasal cannula O2. HEAD: Normocephalic, atraumatic.  EYES: Pupils equal, round, reactive to light.  No scleral icterus.  MOUTH: Oral mucosa moist.  No thrush. NECK: Supple. No thyromegaly. Trachea midline. No JVD.  No adenopathy. PULMONARY: Distant breath sounds.  Coarse breath sounds with no other adventitious sounds. CARDIOVASCULAR: S1 and S2. Regular rate and rhythm.  1/6 systolic murmur at the lower left sternal border. ABDOMEN: Benign. MUSCULOSKELETAL: No joint deformity, no clubbing, no edema.  NEUROLOGIC: No overt focal deficit, speech is fluent. SKIN: Intact,warm,dry. PSYCH: Jovial today.   Assessment/Plan:  Dyspnea post COVID-19 Hx: COPD with chronic respiratory failure No PE noted on chest CT Post COVID-19  dyspnea will take time to recovery Oxygen to maintain saturations at 88% or better Dyspnea and persistent hypoxia is most common post COVID-19 issue 2D echo shows slight decrease in LVEF 40 to 45%  COPD without acute exacerbation Feels better on nebulizer therapy. Brovana 15 mcg twice a day via nebulizer Pulmicort 0.5 mg twice a day via nebulizer Yupelri once daily via nebulizer As needed albuterol up to twice a day  Ischemic cardiomyopathy This issue adds complexity to his management 2D echo shows very slight decrease in function Appreciate cardiology input  Aortic valve disease Status post aortic valve replacement 2019  Nodular-like infiltrates Likely sequela from COVID-19 May represent patches of organizing pneumonia Follow-up with CT scan in 6 to 8 weeks  I discussed all of the above with the patient and his wife was present during my rounds today.   Recommend continuing nebulization treatments as above.  I have advised the patient not to use DuoNeb with the treatments noted above.  This is to avoid duplicating muscarinic agents.  From our standpoint patient may be discharged home on the above regimen with supplemental oxygen to keep oxygen saturations between 88 to 92%.    We will call the patient for follow-up appointment.    Gailen Shelter. Laura Scottlyn Mchaney, MD [facility] 02/05/2021, 7:41 PM   *This note was dictated using voice recognition software/Dragon.  Despite best efforts to proofread, errors  can occur which can change the meaning.  Any change was purely unintentional.

## 2021-02-05 NOTE — Progress Notes (Signed)
*  PRELIMINARY RESULTS* Echocardiogram 2D Echocardiogram has been performed.  Harold Parker 02/05/2021, 1:28 PM

## 2021-02-05 NOTE — Progress Notes (Signed)
   02/05/21 1035  Clinical Encounter Type  Visited With Patient  Visit Type Follow-up;Spiritual support  Referral From Nurse  Consult/Referral To Chaplain  Spiritual Encounters  Spiritual Needs Prayer;Emotional  Chaplain Oleta Mouse reported to room 254A per a follow up with Pt, Harold Parker. I advised Pt that I did not have any Sacraments available to perform Communion at the present time. Pt stated, "that is ok I have already asked for  forgiveness, and I enjoyed talking to the Chaplain yesterday, so I'm ok. Pt and I talked about the ceremony itself. I stated, Holy Communion is one of Gods ordained channels of healing, forgiveness and wholeness, and you have already asked for forgiveness, would you agree with that.  Pt stated, yes I agree. I asked if I could pray with him and he agreed. He also specifically asked me to pray for his nephew who is a Emergency planning/management officer he said, "pray that God will protect him daily."

## 2021-02-05 NOTE — Progress Notes (Signed)
Patient ID: Harold Parker, male   DOB: 10/31/1946, 75 y.o.   MRN: [id number] [facility] PROGRESS NOTE  Harold Parker NWG:[id number] DOB: Nov 06, 1946 DOA: 02/04/2021 PCP: Merlene Laughter, MD  HPI/Subjective: Patient feeling better today.  He got a good night sleep with the trazodone last night.  He is interested in taking this medication.  He feels a little bit better with his breathing today.  This morning on 6 L of oxygen.  Last night had some diarrhea.  Admitted with shortness of breath and COPD exacerbation  Objective: Vitals:   02/05/21 0815 02/05/21 1119  BP:  (!) 115/49  Pulse:  81  Resp:  19  Temp:  97.7 F (36.5 C)  SpO2: 93% 96%    Intake/Output Summary (Last 24 hours) at 02/05/2021 1314 Last data filed at 02/05/2021 0956 Gross per 24 hour  Intake --  Output 400 ml  Net -400 ml   Filed Weights   02/04/21 0406 02/04/21 0925 02/05/21 0402  Weight: 78.9 kg 77 kg 79.2 kg    ROS: Review of Systems  Respiratory: Positive for shortness of breath. Negative for cough.   Cardiovascular: Negative for chest pain.  Gastrointestinal: Positive for diarrhea. Negative for abdominal pain, nausea and vomiting.   Exam: Physical Exam HENT:     Head: Normocephalic.     Mouth/Throat:     Pharynx: No oropharyngeal exudate.  Eyes:     General: Lids are normal.     Conjunctiva/sclera: Conjunctivae normal.  Cardiovascular:     Rate and Rhythm: Normal rate and regular rhythm.     Heart sounds: Normal heart sounds, S1 normal and S2 normal.  Pulmonary:     Breath sounds: Examination of the right-lower field reveals decreased breath sounds. Examination of the left-lower field reveals decreased breath sounds. Decreased breath sounds present. No wheezing, rhonchi or rales.  Abdominal:     Palpations: Abdomen is soft.     Tenderness: There is no abdominal tenderness.  Musculoskeletal:     Right lower leg: No swelling.     Left lower leg: No swelling.  Skin:    General: Skin is warm.      Findings: No rash.  Neurological:     Mental Status: He is alert and oriented to person, place, and time.       Data Reviewed: Basic Metabolic Panel: Recent Labs  Lab 02/04/21 0406 02/04/21 1305 02/05/21 0450  NA 131*  --  132*  K 3.8  --  4.5  CL 99  --  100  CO2 22  --  24  GLUCOSE 223*  --  343*  BUN 16  --  38*  CREATININE 0.97  --  1.32*  CALCIUM 8.2*  --  9.2  MG  --  2.0 2.3  PHOS  --   --  4.0   Liver Function Tests: Recent Labs  Lab 02/04/21 0406  AST 22  ALT 34  ALKPHOS 66  BILITOT 1.2  PROT 6.5  ALBUMIN 2.8*   CBC: Recent Labs  Lab 02/04/21 0406 02/05/21 0450  WBC 9.0 9.7  NEUTROABS 6.1  --   HGB 13.2 11.9*  HCT 38.5* 34.2*  MCV 89.5 88.4  PLT 148* 191   BNP (last 3 results) Recent Labs    01/11/21 0538 01/13/21 0434 02/04/21 0406  BNP 181.6* 591.1* 293.2*     CBG: Recent Labs  Lab 02/04/21 1240 02/04/21 1615 02/05/21 0748 02/05/21 1117  GLUCAP 511* 355* 340* 185*  Studies: CT ANGIO CHEST PE W OR WO CONTRAST  Result Date: 02/04/2021 CLINICAL DATA:  Rule out pulmonary embolus.  High probability. EXAM: CT ANGIOGRAPHY CHEST WITH CONTRAST TECHNIQUE: Multidetector CT imaging of the chest was performed using the standard protocol during bolus administration of intravenous contrast. Multiplanar CT image reconstructions and MIPs were obtained to evaluate the vascular anatomy. CONTRAST:  57mL OMNIPAQUE IOHEXOL 350 MG/ML SOLN COMPARISON:  02/25/2019 FINDINGS: Cardiovascular: Mild scratch set heart size upper limits of normal. Previous median sternotomy and CABG procedure. Aortic atherosclerosis. The main pulmonary artery is patent. No lobar or segmental pulmonary artery filling defects. Mediastinum/Nodes: No enlarged axillary or supraclavicular lymph nodes. Prominent mediastinal lymph nodes are identified including 1.1 cm low right paratracheal lymph node, image 40/4. This is unchanged from previous exam. Left pre-vascular node  measures 1.3 cm, image 38/4. Previously 1.1 cm. Subcarinal node measures 1.2 cm, image 48/4. Previously 0.8 cm. Prominent bilateral hilar lymphoid none of which meet CT criteria for adenopathy. Lungs/Pleura: Moderate changes of centrilobular emphysema. Patchy area of atelectasis and airspace density is noted within the posterior left lung base. Mild subsegmental atelectasis is noted in the subpleural right lung base. Patchy irregular nodular density within the superior segment of the left lower lobe measures 1.9 cm, image 47/6. New compared with 02/25/2019. Adjacent part solid nodule measures 1 cm, image 53/6. New compared with 02/25/2019. Small round solid nodule in the superior segment of right lower lobe measures 6 mm, image 50/6. New from previous exam. Within the right upper lobe there is a 5 mm lung nodule, image 28/6. New from previous exam. Upper Abdomen: Bilateral kidney cysts are again noted. The largest arises from the upper pole of right kidney measuring 5.5 cm, image 90/4. Aortic atherosclerosis noted. Prominent gastrohepatic ligament node measures 1.2 cm, image 90/4. Musculoskeletal: No acute osseous findings mild multilevel degenerative disc disease noted within the lower thoracic spine. Review of the MIP images confirms the above findings. IMPRESSION: 1. No evidence for acute pulmonary embolus. 2. Patchy area of atelectasis and airspace density is noted within the posterior left lung base. Findings are nonspecific but are favored to reflect inflammatory/infectious process. 3. Scattered lung nodules, new from previous exam. The dominant nodule is in the superior segment of left lower lobe measuring 1.9 cm. Non-contrast chest CT at 3-6 months is recommended. If the nodules are stable at time of repeat CT, then future CT at 18-24 months (from today's scan) is considered optional for low-risk patients, but is recommended for high-risk patients. This recommendation follows the consensus statement:  Guidelines for Management of Incidental Pulmonary Nodules Detected on CT Images: From the Fleischner Society 2017; Radiology 2017; 284:228-243. 4. Prominent mediastinal and hilar lymph nodes are again noted. These are nonspecific in the setting of pneumonia and may be reactive in etiology. Attention on follow-up imaging is advised. Aortic Atherosclerosis (ICD10-I70.0) and Emphysema (ICD10-J43.9). Electronically Signed   By: Signa Kell M.D.   On: 02/04/2021 06:54   DG Chest Portable 1 View  Result Date: 02/04/2021 CLINICAL DATA:  Shortness of breath and cough. EXAM: PORTABLE CHEST 1 VIEW COMPARISON:  01/21/2021 FINDINGS: Interstitial coarsening which appears bronchitic based on previous imaging. There is no edema, discrete consolidation, effusion, or pneumothorax. Normal heart size. Prior CABG and aortic valve replacement. IMPRESSION: Bronchitic markings without acute superimposed finding. Electronically Signed   By: Marnee Spring M.D.   On: 02/04/2021 04:45    Scheduled Meds: . ALPRAZolam  0.5 mg Oral BID  . amLODipine  5  mg Oral Daily  . arformoterol  15 mcg Nebulization BID  . aspirin EC  81 mg Oral Daily  . budesonide (PULMICORT) nebulizer solution  0.5 mg Nebulization BID  . busPIRone  5 mg Oral BID  . cholecalciferol  1,000 Units Oral BH-q7a  . enoxaparin (LOVENOX) injection  40 mg Subcutaneous Q24H  . ezetimibe  10 mg Oral QHS  . folic acid  500 mcg Oral QPM  . glipiZIDE  5 mg Oral BID AC  . insulin aspart  0-15 Units Subcutaneous TID WC  . insulin aspart  0-5 Units Subcutaneous QHS  . insulin glargine  10 Units Subcutaneous Daily  . lidocaine  1 patch Transdermal Q24H  . losartan  100 mg Oral Daily  . melatonin  10 mg Oral QHS  . metoprolol succinate  100 mg Oral Daily  . pantoprazole  40 mg Oral Daily  . revefenacin  175 mcg Nebulization Daily  . sucralfate  1 g Oral TID WC & HS  . traZODone  50 mg Oral QHS    Assessment/Plan:  1. Acute on chronic hypoxic respiratory  failure. Patient had recent COVID pneumonia on 01/11/2021. Patient was on 2 L previously but was recently discharged on 4 L. When patient came in pulse ox was 87% on 4 L. Patient again on 6 L when I saw him this morning. Encourage slow deep breathing. Asked nursing staff to try to taper down to 4 L with goal pulse ox being 86 to 92%. 2. COPD exacerbation and recent Covid infection. I did not start steroids because yesterday sugar was 500. Continue nebulizers. Patient breathing better today. Incentive spirometer. 3. Insomnia and anxiety. Did well with trazodone last night and got a good night sleep. Continue other antianxiety medications. 4. Type 2 diabetes mellitus with hyperglycemia. I started low-dose glargine insulin yesterday and restarted glipizide with a sugar greater than 500. Sugars trending better. Patient states he takes glipizide twice a day I will give glipizide XL 10 mg daily starting tomorrow. 5. Pulmonary nodule seen on CT scan. Case discussed with pulmonary and they believe it secondary to recent Covid infection. 6. Acute kidney injury. Creatinine 0.97 on admission and 1.32 today. Hold Lasix. Did receive CAT scan with contrast. Watch creatinine closely. 7. 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 Continue losartan, metoprolol and hold Lasix today with elevation in creatinine.  8. Diarrhea last night. Stool for C. difficile for further diarrhea     Code Status:     Code Status Orders  (From admission, onward)         Start     Ordered   02/04/21 0531  Full code  Continuous        02/04/21 0532        Code Status History    Date Active Date Inactive Code Status Order ID Comments User Context   01/11/2021 1113 01/24/2021 2245 Full Code [id number]  Lorretta Harp, MD ED   03/04/2019 1040 03/04/2019 1733 Full Code [id number]  Iran Ouch, MD Inpatient   03/14/2018 0135 03/18/2018 1900 Full Code [id number]  Cammy Copa, MD ED   02/07/2018 1636 02/26/2018  2129 Full Code [id number]  Creig Hines, NP Inpatient   02/05/2018 2047 02/07/2018 1608 Full Code [id number]  Shaune Pollack, MD Inpatient   Advance Care Planning Activity     Family Communication: Spoke with significant other on the phone Disposition Plan: Status is: Inpatient  Dispo: The patient is from: Home  Anticipated d/c is to: Home              Anticipated d/c date is: 02/06/2021 versus 02/07/2021              Patient currently doing better after a good night sleep but has acute kidney injury today need to watch creatinine tomorrow.   Difficult to place patient. No  Time spent: 28 minutes  Myelle [employer]

## 2021-02-05 NOTE — Progress Notes (Signed)
Progress Note  Patient Name: Harold Parker Date of Encounter: 02/05/2021  [facility] Cardiologist: [name], MD   Subjective   He had trazodone last night and slept very well.  He woke up very refreshed and feeling better.  Shortness of breath improved.  Inpatient Medications    Scheduled Meds: . ALPRAZolam  0.5 mg Oral BID  . amLODipine  5 mg Oral Daily  . arformoterol  15 mcg Nebulization BID  . aspirin EC  81 mg Oral Daily  . budesonide (PULMICORT) nebulizer solution  0.5 mg Nebulization BID  . busPIRone  5 mg Oral BID  . cholecalciferol  1,000 Units Oral BH-q7a  . enoxaparin (LOVENOX) injection  40 mg Subcutaneous Q24H  . ezetimibe  10 mg Oral QHS  . folic acid  500 mcg Oral QPM  . glipiZIDE  5 mg Oral Q breakfast  . insulin aspart  0-15 Units Subcutaneous TID WC  . insulin aspart  0-5 Units Subcutaneous QHS  . insulin glargine  10 Units Subcutaneous Daily  . lidocaine  1 patch Transdermal Q24H  . losartan  100 mg Oral Daily  . melatonin  10 mg Oral QHS  . metoprolol succinate  100 mg Oral Daily  . pantoprazole  40 mg Oral Daily  . revefenacin  175 mcg Nebulization Daily  . sucralfate  1 g Oral TID WC & HS   Continuous Infusions:  PRN Meds: acetaminophen **OR** acetaminophen, albuterol, guaiFENesin-dextromethorphan, ondansetron **OR** ondansetron (ZOFRAN) IV, traZODone   Vital Signs    Vitals:   02/05/21 0402 02/05/21 0428 02/05/21 0750 02/05/21 0815  BP: (!) 145/72  (!) 120/49   Pulse: 91  80   Resp: 19  19   Temp: 98.7 F (37.1 C)  97.9 F (36.6 C)   TempSrc:      SpO2: (!) 86% 93% 96% 93%  Weight: 79.2 kg     Height:        Intake/Output Summary (Last 24 hours) at 02/05/2021 1049 Last data filed at 02/05/2021 0956 Gross per 24 hour  Intake --  Output 450 ml  Net -450 ml   Last 3 Weights 02/05/2021 02/04/2021 02/04/2021  Weight (lbs) 174 lb 9.6 oz 169 lb 12.1 oz 174 lb  Weight (kg) 79.198 kg 77 kg 78.926 kg      Telemetry    Normal  sinus rhythm with 1 short of NSVT 4 beats- Personally Reviewed  ECG     - Personally Reviewed  Physical Exam   GEN: No acute distress.   Neck: No JVD Cardiac: RRR, no murmurs, rubs, or gallops.  Respiratory:  Bilateral rhonchi but improved aeration overall since yesterday.  No crackles. GI: Soft, nontender, non-distended  MS: No edema; No deformity. Neuro:  Nonfocal  Psych: Normal affect   Labs    High Sensitivity Troponin:   Recent Labs  Lab 01/11/21 0538 01/11/21 1335 01/11/21 1415 02/04/21 0406 02/04/21 0935  TROPONINIHS 21* 67* 30* 24* 79*      Chemistry Recent Labs  Lab 02/04/21 0406 02/05/21 0450  NA 131* 132*  K 3.8 4.5  CL 99 100  CO2 22 24  GLUCOSE 223* 343*  BUN 16 38*  CREATININE 0.97 1.32*  CALCIUM 8.2* 9.2  PROT 6.5  --   ALBUMIN 2.8*  --   AST 22  --   ALT 34  --   ALKPHOS 66  --   BILITOT 1.2  --   GFRNONAA >60 56*  ANIONGAP 10 8  Hematology Recent Labs  Lab 02/04/21 0406 02/05/21 0450  WBC 9.0 9.7  RBC 4.30 3.87*  HGB 13.2 11.9*  HCT 38.5* 34.2*  MCV 89.5 88.4  MCH 30.7 30.7  MCHC 34.3 34.8  RDW 13.7 13.8  PLT 148* 191    BNP Recent Labs  Lab 02/04/21 0406  BNP 293.2*     DDimer No results for input(s): DDIMER in the last 168 hours.   Radiology    CT ANGIO CHEST PE W OR WO CONTRAST  Result Date: 02/04/2021 CLINICAL DATA:  Rule out pulmonary embolus.  High probability. EXAM: CT ANGIOGRAPHY CHEST WITH CONTRAST TECHNIQUE: Multidetector CT imaging of the chest was performed using the standard protocol during bolus administration of intravenous contrast. Multiplanar CT image reconstructions and MIPs were obtained to evaluate the vascular anatomy. CONTRAST:  30mL OMNIPAQUE IOHEXOL 350 MG/ML SOLN COMPARISON:  02/25/2019 FINDINGS: Cardiovascular: Mild scratch set heart size upper limits of normal. Previous median sternotomy and CABG procedure. Aortic atherosclerosis. The main pulmonary artery is patent. No lobar or segmental  pulmonary artery filling defects. Mediastinum/Nodes: No enlarged axillary or supraclavicular lymph nodes. Prominent mediastinal lymph nodes are identified including 1.1 cm low right paratracheal lymph node, image 40/4. This is unchanged from previous exam. Left pre-vascular node measures 1.3 cm, image 38/4. Previously 1.1 cm. Subcarinal node measures 1.2 cm, image 48/4. Previously 0.8 cm. Prominent bilateral hilar lymphoid none of which meet CT criteria for adenopathy. Lungs/Pleura: Moderate changes of centrilobular emphysema. Patchy area of atelectasis and airspace density is noted within the posterior left lung base. Mild subsegmental atelectasis is noted in the subpleural right lung base. Patchy irregular nodular density within the superior segment of the left lower lobe measures 1.9 cm, image 47/6. New compared with 02/25/2019. Adjacent part solid nodule measures 1 cm, image 53/6. New compared with 02/25/2019. Small round solid nodule in the superior segment of right lower lobe measures 6 mm, image 50/6. New from previous exam. Within the right upper lobe there is a 5 mm lung nodule, image 28/6. New from previous exam. Upper Abdomen: Bilateral kidney cysts are again noted. The largest arises from the upper pole of right kidney measuring 5.5 cm, image 90/4. Aortic atherosclerosis noted. Prominent gastrohepatic ligament node measures 1.2 cm, image 90/4. Musculoskeletal: No acute osseous findings mild multilevel degenerative disc disease noted within the lower thoracic spine. Review of the MIP images confirms the above findings. IMPRESSION: 1. No evidence for acute pulmonary embolus. 2. Patchy area of atelectasis and airspace density is noted within the posterior left lung base. Findings are nonspecific but are favored to reflect inflammatory/infectious process. 3. Scattered lung nodules, new from previous exam. The dominant nodule is in the superior segment of left lower lobe measuring 1.9 cm. Non-contrast chest  CT at 3-6 months is recommended. If the nodules are stable at time of repeat CT, then future CT at 18-24 months (from today's scan) is considered optional for low-risk patients, but is recommended for high-risk patients. This recommendation follows the consensus statement: Guidelines for Management of Incidental Pulmonary Nodules Detected on CT Images: From the Fleischner Society 2017; Radiology 2017; 284:228-243. 4. Prominent mediastinal and hilar lymph nodes are again noted. These are nonspecific in the setting of pneumonia and may be reactive in etiology. Attention on follow-up imaging is advised. Aortic Atherosclerosis (ICD10-I70.0) and Emphysema (ICD10-J43.9). Electronically Signed   By: Signa Kell M.D.   On: 02/04/2021 06:54   DG Chest Portable 1 View  Result Date: 02/04/2021 CLINICAL DATA:  Shortness  of breath and cough. EXAM: PORTABLE CHEST 1 VIEW COMPARISON:  01/21/2021 FINDINGS: Interstitial coarsening which appears bronchitic based on previous imaging. There is no edema, discrete consolidation, effusion, or pneumothorax. Normal heart size. Prior CABG and aortic valve replacement. IMPRESSION: Bronchitic markings without acute superimposed finding. Electronically Signed   By: Marnee Spring M.D.   On: 02/04/2021 04:45    Cardiac Studies   Echocardiogram is being done today.  Patient Profile     75 y.o. 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 fib 02/2018 , GIB/duodenal ulcers while on DAPT, PAD s/p 02/2019 angiography and intervention to LSFA, hypertension, hyperlipidemia, DM2, COPD, previous tobacco abuse (quit 3 years ago), and who is being followed for elevated troponin in the setting of respiratory failure due to recent COVID-19 pneumonia.    Assessment & Plan    1.  Chronic systolic heart failure: Mildly reduced LV systolic function.  Repeat echocardiogram is pending.  He appears to be euvolemic on current dose of  furosemide 40 mg once daily.  2.  Coronary artery disease status post CABG.  Mildly elevated troponin is likely due to supply demand ischemia.  No evidence of acute coronary syndrome.  No plans for further ischemic cardiac evaluation.  3.  Acute on chronic hypoxic respiratory failure due to recent COVID-19 pneumonia: Seems to be improving.  4.  Anxiety and insomnia: The patient reports significant improvement after he received 1 dose of trazodone.       For questions or updates, please contact [facility] Please consult [email or web address] for contact info under        Signed, [name], MD  02/05/2021, 10:49 AM

## 2021-02-05 NOTE — Consult Note (Deleted)
Follow-up: Persistent shortness of breath after COVID-19 pneumonia Referring Physician: Fidela Juneau, MD  Harold Parker is an 75 y.o. male.  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  He has been on oxygen at 2 L/min chronically.  Initially started using oxygen at nighttime but most recently had been on 24/7.  Admitted to [facility] from 01/11/2021 through 01/24/2021.  At that time he was noted to have acute hypoxemic respiratory failure due to COVID-19 he was discharged home 4 L/min nasal cannula O2.  He notes that he has desaturations to the mid 80s on activity on this flow rate.  Patient was evaluated yesterday.  Changes to his nebulizer therapy were done and he finds that these have been very helpful, he notes his dyspnea is markedly better.  He also took trazodone last night and notes that this helped him sleep well and feels rested today.  He also has issues with anxiety and this has helped him.  Allergies:  Allergies  Allergen Reactions  . Atorvastatin Other (See Comments)    Leg aches and weakness    Objective:  Medications:  Scheduled Meds: . ALPRAZolam  0.5 mg Oral BID  . amLODipine  5 mg Oral Daily  . arformoterol  15 mcg Nebulization BID  . aspirin EC  81 mg Oral Daily  . budesonide (PULMICORT) nebulizer solution  0.5 mg Nebulization BID  . busPIRone  5 mg Oral BID  . cholecalciferol  1,000 Units Oral BH-q7a  . enoxaparin (LOVENOX) injection  40 mg Subcutaneous Q24H  . ezetimibe  10 mg Oral QHS  . folic acid  500 mcg Oral QPM  . [START ON 02/06/2021] glipiZIDE  10 mg Oral Q breakfast  . insulin aspart  0-15 Units Subcutaneous TID WC  . insulin aspart  0-5 Units Subcutaneous QHS  . insulin glargine  10 Units Subcutaneous Daily  . lidocaine  1 patch Transdermal Q24H  . losartan  100 mg Oral Daily  . melatonin  10 mg Oral QHS   . metoprolol succinate  100 mg Oral Daily  . pantoprazole  40 mg Oral Daily  . revefenacin  175 mcg Nebulization Daily  . sucralfate  1 g Oral TID WC & HS  . traZODone  50 mg Oral QHS   Continuous Infusions: PRN Meds:.acetaminophen **OR** acetaminophen, albuterol, guaiFENesin-dextromethorphan, ondansetron **OR** ondansetron (ZOFRAN) IV    Results for orders placed or performed during the hospital encounter of 02/04/21 (from the past 48 hour(s))  Brain natriuretic peptide     Status: Abnormal   Collection Time: 02/04/21  4:06 AM  Result Value Ref Range   B Natriuretic Peptide 293.2 (H) 0.0 - 100.0 pg/mL    Comment: Performed at [facility], [address] [place], [place] [address]  Troponin I (High Sensitivity)     Status: Abnormal   Collection Time: 02/04/21  4:06 AM  Result Value Ref Range   Troponin I (High Sensitivity) 24 (H) <18 ng/L    Comment: (NOTE) Elevated high sensitivity troponin I (hsTnI) values and significant  changes across serial measurements may suggest ACS but many other  chronic and acute conditions are known to elevate hsTnI results.  Refer to the "Links" section for chest pain algorithms and additional  guidance. Performed at [facility], [address], [place], [place] [address]   CBC with Differential  Status: Abnormal   Collection Time: 02/04/21  4:06 AM  Result Value Ref Range   WBC 9.0 4.0 - 10.5 K/uL   RBC 4.30 4.22 - 5.81 MIL/uL   Hemoglobin 13.2 13.0 - 17.0 g/dL   HCT 46.5 (L) 03.5 - 46.5 %   MCV 89.5 80.0 - 100.0 fL   MCH 30.7 26.0 - 34.0 pg   MCHC 34.3 30.0 - 36.0 g/dL   RDW 68.1 27.5 - 17.0 %   Platelets 148 (L) 150 - 400 K/uL   nRBC 0.0 0.0 - 0.2 %   Neutrophils Relative % 69 %   Neutro Abs 6.1 1.7 - 7.7 K/uL   Lymphocytes Relative 20 %   Lymphs Abs 1.8 0.7 - 4.0 K/uL   Monocytes Relative 9 %   Monocytes Absolute 0.8 0.1 - 1.0 K/uL   Eosinophils Relative 2 %   Eosinophils Absolute 0.2 0.0 - 0.5 K/uL    Basophils Relative 0 %   Basophils Absolute 0.0 0.0 - 0.1 K/uL   Immature Granulocytes 0 %   Abs Immature Granulocytes 0.04 0.00 - 0.07 K/uL    Comment: Performed at [facility], [address] [place], [place] [address]  Comprehensive metabolic panel     Status: Abnormal   Collection Time: 02/04/21  4:06 AM  Result Value Ref Range   Sodium 131 (L) 135 - 145 mmol/L   Potassium 3.8 3.5 - 5.1 mmol/L   Chloride 99 98 - 111 mmol/L   CO2 22 22 - 32 mmol/L   Glucose, Bld 223 (H) 70 - 99 mg/dL    Comment: Glucose reference range applies only to samples taken after fasting for at least 8 hours.   BUN 16 8 - 23 mg/dL   Creatinine, Ser 4.49 0.61 - 1.24 mg/dL   Calcium 8.2 (L) 8.9 - 10.3 mg/dL   Total Protein 6.5 6.5 - 8.1 g/dL   Albumin 2.8 (L) 3.5 - 5.0 g/dL   AST 22 15 - 41 U/L   ALT 34 0 - 44 U/L   Alkaline Phosphatase 66 38 - 126 U/L   Total Bilirubin 1.2 0.3 - 1.2 mg/dL   GFR, Estimated >67 >59 mL/min    Comment: (NOTE) Calculated using the CKD-EPI Creatinine Equation (2021)    Anion gap 10 5 - 15    Comment: Performed at [facility], [address] [place], [place] [address]  Procalcitonin - Baseline     Status: None   Collection Time: 02/04/21  4:06 AM  Result Value Ref Range   Procalcitonin <0.10 ng/mL    Comment:        Interpretation: PCT (Procalcitonin) <= 0.5 ng/mL: Systemic infection (sepsis) is not likely. Local bacterial infection is possible. (NOTE)       Sepsis PCT Algorithm           Lower Respiratory Tract                                      Infection PCT Algorithm    ----------------------------     ----------------------------         PCT < 0.25 ng/mL                PCT < 0.10 ng/mL          Strongly encourage             Strongly discourage   discontinuation  of antibiotics    initiation of antibiotics    ----------------------------     -----------------------------       PCT 0.25 - 0.50 ng/mL            PCT 0.10 - 0.25 ng/mL                OR       >80% decrease in PCT            Discourage initiation of                                            antibiotics      Encourage discontinuation           of antibiotics    ----------------------------     -----------------------------         PCT >= 0.50 ng/mL              PCT 0.26 - 0.50 ng/mL               AND        <80% decrease in PCT             Encourage initiation of                                             antibiotics       Encourage continuation           of antibiotics    ----------------------------     -----------------------------        PCT >= 0.50 ng/mL                  PCT > 0.50 ng/mL               AND         increase in PCT                  Strongly encourage                                      initiation of antibiotics    Strongly encourage escalation           of antibiotics                                     -----------------------------                                           PCT <= 0.25 ng/mL                                                 OR                                        >  80% decrease in PCT                                      Discontinue / Do not initiate                                             antibiotics  Performed at [facility], [address] [place], [place] [address]   Troponin I (High Sensitivity)     Status: Abnormal   Collection Time: 02/04/21  9:35 AM  Result Value Ref Range   Troponin I (High Sensitivity) 79 (H) <18 ng/L    Comment: READ BACK AND VERIFIED WITH JESSICA CHRISTMAS 02/04/21 1026 [facility] (NOTE) Elevated high sensitivity troponin I (hsTnI) values and significant  changes across serial measurements may suggest ACS but many other  chronic and acute conditions are known to elevate hsTnI results.  Refer to the "Links" section for chest pain algorithms and additional  guidance. Performed at [facility], [address] [place], [place] [address]   Glucose, capillary      Status: Abnormal   Collection Time: 02/04/21 12:40 PM  Result Value Ref Range   Glucose-Capillary 511 (HH) 70 - 99 mg/dL    Comment: Glucose reference range applies only to samples taken after fasting for at least 8 hours.   Comment 1 Notify RN   TSH     Status: None   Collection Time: 02/04/21  1:05 PM  Result Value Ref Range   TSH 0.627 0.350 - 4.500 uIU/mL    Comment: Performed by a 3rd Generation assay with a functional sensitivity of <=0.01 uIU/mL. Performed at [facility], [address] [place], [place] [address]   Magnesium     Status: None   Collection Time: 02/04/21  1:05 PM  Result Value Ref Range   Magnesium 2.0 1.7 - 2.4 mg/dL    Comment: Performed at [facility], [address] [place], [place] [address]  Glucose, capillary     Status: Abnormal   Collection Time: 02/04/21  4:15 PM  Result Value Ref Range   Glucose-Capillary 355 (H) 70 - 99 mg/dL    Comment: Glucose reference range applies only to samples taken after fasting for at least 8 hours.  Basic metabolic panel     Status: Abnormal   Collection Time: 02/05/21  4:50 AM  Result Value Ref Range   Sodium 132 (L) 135 - 145 mmol/L   Potassium 4.5 3.5 - 5.1 mmol/L   Chloride 100 98 - 111 mmol/L   CO2 24 22 - 32 mmol/L   Glucose, Bld 343 (H) 70 - 99 mg/dL    Comment: Glucose reference range applies only to samples taken after fasting for at least 8 hours.   BUN 38 (H) 8 - 23 mg/dL   Creatinine, Ser 0.86 (H) 0.61 - 1.24 mg/dL   Calcium 9.2 8.9 - 76.1 mg/dL   GFR, Estimated 56 (L) >60 mL/min    Comment: (NOTE) Calculated using the CKD-EPI Creatinine Equation (2021)    Anion gap 8 5 - 15    Comment: Performed at [facility], [address] [place], [place] [address]  CBC     Status: Abnormal   Collection Time: 02/05/21  4:50 AM  Result Value Ref  Range   WBC 9.7 4.0 - 10.5 K/uL   RBC 3.87 (L) 4.22 - 5.81 MIL/uL   Hemoglobin 11.9 (L) 13.0 - 17.0 g/dL   HCT 16.1 (L) 09.6 - 04.5  %   MCV 88.4 80.0 - 100.0 fL   MCH 30.7 26.0 - 34.0 pg   MCHC 34.8 30.0 - 36.0 g/dL   RDW 40.9 81.1 - 91.4 %   Platelets 191 150 - 400 K/uL   nRBC 0.0 0.0 - 0.2 %    Comment: Performed at [facility], [address] [place], [place] [address]  Magnesium     Status: None   Collection Time: 02/05/21  4:50 AM  Result Value Ref Range   Magnesium 2.3 1.7 - 2.4 mg/dL    Comment: Performed at [facility], [address], [place], [place] [address]  Phosphorus     Status: None   Collection Time: 02/05/21  4:50 AM  Result Value Ref Range   Phosphorus 4.0 2.5 - 4.6 mg/dL    Comment: Performed at [facility], [address] [place], [place] [address]  Glucose, capillary     Status: Abnormal   Collection Time: 02/05/21  7:48 AM  Result Value Ref Range   Glucose-Capillary 340 (H) 70 - 99 mg/dL    Comment: Glucose reference range applies only to samples taken after fasting for at least 8 hours.  Glucose, capillary     Status: Abnormal   Collection Time: 02/05/21 11:17 AM  Result Value Ref Range   Glucose-Capillary 185 (H) 70 - 99 mg/dL    Comment: Glucose reference range applies only to samples taken after fasting for at least 8 hours.  Glucose, capillary     Status: Abnormal   Collection Time: 02/05/21  4:51 PM  Result Value Ref Range   Glucose-Capillary 132 (H) 70 - 99 mg/dL    Comment: Glucose reference range applies only to samples taken after fasting for at least 8 hours.    CT ANGIO CHEST PE W OR WO CONTRAST  Result Date: 02/04/2021 CLINICAL DATA:  Rule out pulmonary embolus.  High probability. EXAM: CT ANGIOGRAPHY CHEST WITH CONTRAST TECHNIQUE: Multidetector CT imaging of the chest was performed using the standard protocol during bolus administration of intravenous contrast. Multiplanar CT image reconstructions and MIPs were obtained to evaluate the vascular anatomy. CONTRAST:  75mL OMNIPAQUE IOHEXOL 350 MG/ML SOLN COMPARISON:  02/25/2019 FINDINGS:  Cardiovascular: Mild scratch set heart size upper limits of normal. Previous median sternotomy and CABG procedure. Aortic atherosclerosis. The main pulmonary artery is patent. No lobar or segmental pulmonary artery filling defects. Mediastinum/Nodes: No enlarged axillary or supraclavicular lymph nodes. Prominent mediastinal lymph nodes are identified including 1.1 cm low right paratracheal lymph node, image 40/4. This is unchanged from previous exam. Left pre-vascular node measures 1.3 cm, image 38/4. Previously 1.1 cm. Subcarinal node measures 1.2 cm, image 48/4. Previously 0.8 cm. Prominent bilateral hilar lymphoid none of which meet CT criteria for adenopathy. Lungs/Pleura: Moderate changes of centrilobular emphysema. Patchy area of atelectasis and airspace density is noted within the posterior left lung base. Mild subsegmental atelectasis is noted in the subpleural right lung base. Patchy irregular nodular density within the superior segment of the left lower lobe measures 1.9 cm, image 47/6. New compared with 02/25/2019. Adjacent part solid nodule measures 1 cm, image 53/6. New compared with 02/25/2019. Small round solid nodule in the superior segment of right lower lobe measures 6 mm, image 50/6. New from previous exam. Within  the right upper lobe there is a 5 mm lung nodule, image 28/6. New from previous exam. Upper Abdomen: Bilateral kidney cysts are again noted. The largest arises from the upper pole of right kidney measuring 5.5 cm, image 90/4. Aortic atherosclerosis noted. Prominent gastrohepatic ligament node measures 1.2 cm, image 90/4. Musculoskeletal: No acute osseous findings mild multilevel degenerative disc disease noted within the lower thoracic spine. Review of the MIP images confirms the above findings. IMPRESSION: 1. No evidence for acute pulmonary embolus. 2. Patchy area of atelectasis and airspace density is noted within the posterior left lung base. Findings are nonspecific but are favored  to reflect inflammatory/infectious process. 3. Scattered lung nodules, new from previous exam. The dominant nodule is in the superior segment of left lower lobe measuring 1.9 cm. Non-contrast chest CT at 3-6 months is recommended. If the nodules are stable at time of repeat CT, then future CT at 18-24 months (from today's scan) is considered optional for low-risk patients, but is recommended for high-risk patients. This recommendation follows the consensus statement: Guidelines for Management of Incidental Pulmonary Nodules Detected on CT Images: From the Fleischner Society 2017; Radiology 2017; 284:228-243. 4. Prominent mediastinal and hilar lymph nodes are again noted. These are nonspecific in the setting of pneumonia and may be reactive in etiology. Attention on follow-up imaging is advised. Aortic Atherosclerosis (ICD10-I70.0) and Emphysema (ICD10-J43.9). Electronically Signed   By: Signa Kell M.D.   On: 02/04/2021 06:54   DG Chest Portable 1 View  Result Date: 02/04/2021 CLINICAL DATA:  Shortness of breath and cough. EXAM: PORTABLE CHEST 1 VIEW COMPARISON:  01/21/2021 FINDINGS: Interstitial coarsening which appears bronchitic based on previous imaging. There is no edema, discrete consolidation, effusion, or pneumothorax. Normal heart size. Prior CABG and aortic valve replacement. IMPRESSION: Bronchitic markings without acute superimposed finding. Electronically Signed   By: Marnee Spring M.D.   On: 02/04/2021 04:45   ECHOCARDIOGRAM COMPLETE  Result Date: 02/05/2021    ECHOCARDIOGRAM REPORT   Patient Name:   Harold Parker Date of Exam: 02/05/2021 Medical Rec #:  [id number]     Height:       66.0 in Accession #:    [id number]    Weight:       174.6 lb Date of Birth:  May 10, 1946     BSA:          1.888 m Patient Age:    74 years      BP:           120/49 mmHg Patient Gender: M             HR:           80 bpm. Exam Location:  [facility] Procedure: 2D Echo, Cardiac Doppler and Color Doppler Indications:      Cardiomyopathy-Ischemic I25.5  History:         Patient has prior history of Echocardiogram examinations. CAD;                  Risk Factors:Hypertension and Diabetes.  Sonographer:     Neysa Bonito Roar Referring Phys:  [id number] Al[address] Rinehimer Knox Saliva Diagnosing Phys: [name] MD IMPRESSIONS  1. Left ventricular ejection fraction, by estimation, is 40 to 45%. The left ventricle has mildly decreased function. Left ventricular endocardial border not optimally defined to evaluate regional wall motion. Left ventricular diastolic parameters are consistent with Grade I diastolic dysfunction (impaired relaxation).  2. Right ventricular systolic function is normal. The right ventricular size is normal. There is  moderately elevated pulmonary artery systolic pressure. The estimated right ventricular systolic pressure is 58.4 mmHg.  3. Left atrial size was mildly dilated.  4. The mitral valve is normal in structure. Mild mitral valve regurgitation. No evidence of mitral stenosis.  5. Tricuspid valve regurgitation is moderate.  6. The aortic valve was not well visualized. Aortic valve regurgitation is not visualized. No aortic stenosis is present.  7. The inferior vena cava is normal in size with greater than 50% respiratory variability, suggesting right atrial pressure of 3 mmHg. FINDINGS  Left Ventricle: Left ventricular ejection fraction, by estimation, is 40 to 45%. The left ventricle has mildly decreased function. Left ventricular endocardial border not optimally defined to evaluate regional wall motion. The left ventricular internal cavity size was normal in size. There is no left ventricular hypertrophy. Left ventricular diastolic parameters are consistent with Grade I diastolic dysfunction (impaired relaxation). Right Ventricle: The right ventricular size is normal. No increase in right ventricular wall thickness. Right ventricular systolic function is normal. There is moderately elevated pulmonary artery systolic pressure.  The tricuspid regurgitant velocity is 3.72 m/s, and with an assumed right atrial pressure of 3 mmHg, the estimated right ventricular systolic pressure is 58.4 mmHg. Left Atrium: Left atrial size was mildly dilated. Right Atrium: Right atrial size was normal in size. Pericardium: There is no evidence of pericardial effusion. Mitral Valve: The mitral valve is normal in structure. Mild mitral valve regurgitation. No evidence of mitral valve stenosis. Tricuspid Valve: The tricuspid valve is normal in structure. Tricuspid valve regurgitation is moderate . No evidence of tricuspid stenosis. Aortic Valve: The aortic valve was not well visualized. Aortic valve regurgitation is not visualized. No aortic stenosis is present. Aortic valve peak gradient measures 11.8 mmHg. Pulmonic Valve: The pulmonic valve was normal in structure. Pulmonic valve regurgitation is mild. No evidence of pulmonic stenosis. Aorta: The aortic root is normal in size and structure. Venous: The inferior vena cava is normal in size with greater than 50% respiratory variability, suggesting right atrial pressure of 3 mmHg. IAS/Shunts: No atrial level shunt detected by color flow Doppler.  LEFT VENTRICLE PLAX 2D LVIDd:         4.35 cm  Diastology LVIDs:         3.36 cm  LV e' medial:    4.12 cm/s LV PW:         1.10 cm  LV E/e' medial:  15.2 LV IVS:        1.00 cm  LV e' lateral:   6.66 cm/s LVOT diam:     1.70 cm  LV E/e' lateral: 9.4 LVOT Area:     2.27 cm  RIGHT VENTRICLE RV Mid diam:    4.01 cm RV S prime:     6.83 cm/s TAPSE (M-mode): 1.5 cm LEFT ATRIUM             Index       RIGHT ATRIUM           Index LA diam:        3.85 cm 2.04 cm/m  RA Area:     19.80 cm LA Vol (A2C):   69.0 ml 36.55 ml/m RA Volume:   63.60 ml  33.69 ml/m LA Vol (A4C):   49.8 ml 26.38 ml/m LA Biplane Vol: 59.9 ml 31.73 ml/m  AORTIC VALVE                PULMONIC VALVE AV Area (Vmax): 1.03 cm    PV  Vmax:        1.02 m/s AV Vmax:        172.00 cm/s PV Peak grad:   4.2 mmHg  AV Peak Grad:   11.8 mmHg   RVOT Peak grad: 2 mmHg LVOT Vmax:      78.00 cm/s  AORTA Ao Root diam: 2.90 cm MITRAL VALVE               TRICUSPID VALVE MV Area (PHT): 4.63 cm    TR Peak grad:   55.4 mmHg MV Decel Time: 164 msec    TR Vmax:        372.00 cm/s MV E velocity: 62.60 cm/s MV A velocity: 91.70 cm/s  SHUNTS MV E/A ratio:  0.68        Systemic Diam: 1.70 cm MV A Prime:    11.4 cm/s Lorine BearsMuhammad Arida MD Electronically signed by Lorine BearsMuhammad Arida MD Signature Date/Time: 02/05/2021/1:50:38 PM    Final     Review of Systems  A 10 point review of systems was performed and it is as noted above otherwise negative.  Blood pressure (!) 112/58, pulse 68, temperature 97.7 F (36.5 C), resp. rate 19, height 5\' 6"  (1.676 m), weight 79.2 kg, SpO2 97 %.   Physical Exam GENERAL: Chronically ill-appearing man, well nourished, well developed.  Hoarse, (chronic).  No conversational dyspnea.  Comfortable with nasal cannula O2. HEAD: Normocephalic, atraumatic.  EYES: Pupils equal, round, reactive to light.  No scleral icterus.  MOUTH: Oral mucosa moist.  No thrush. NECK: Supple. No thyromegaly. Trachea midline. No JVD.  No adenopathy. PULMONARY: Distant breath sounds.  Coarse breath sounds with no other adventitious sounds. CARDIOVASCULAR: S1 and S2. Regular rate and rhythm.  1/6 systolic murmur at the lower left sternal border. ABDOMEN: Benign. MUSCULOSKELETAL: No joint deformity, no clubbing, no edema.  NEUROLOGIC: No overt focal deficit, speech is fluent. SKIN: Intact,warm,dry. PSYCH: Jovial today.   Assessment/Plan:  Dyspnea post COVID-19 Hx: COPD with chronic respiratory failure No PE noted on chest CT Post COVID-19 dyspnea will take time to recovery Oxygen to maintain saturations at 88% or better Dyspnea and persistent hypoxia is most common post COVID-19 issue 2D echo shows slight decrease in LVEF 40 to 45%  COPD without acute exacerbation Feels better on nebulizer therapy. Brovana 15 mcg  twice a day via nebulizer Pulmicort 0.5 mg twice a day via nebulizer Yupelri once daily via nebulizer As needed albuterol up to twice a day  Ischemic cardiomyopathy This issue adds complexity to his management 2D echo shows very slight decrease in function Appreciate cardiology input  Aortic valve disease Status post aortic valve replacement 2019  Nodular-like infiltrates Likely sequela from COVID-19 May represent patches of organizing pneumonia Follow-up with CT scan in 6 to 8 weeks  I discussed all of the above with the patient and his wife was present during my rounds today.    Recommend continuing nebulization treatments as above.  I have advised the patient not to use DuoNeb with the treatments noted above.  This is to avoid duplicating muscarinic agents.  From our standpoint patient may be discharged home on the above regimen with supplemental oxygen to keep oxygen saturations between 88 to 92%.    We will call the patient for follow-up appointment.    Gailen Shelter. Laura Stephaine Breshears, MD [facility] 02/05/2021, 7:41 PM   *This note was dictated using voice recognition software/Dragon.  Despite best efforts to proofread, errors can occur which can change the meaning.  Any  change was purely unintentional.

## 2021-02-06 ENCOUNTER — Inpatient Hospital Stay: Payer: Medicare Other | Admitting: Adult Health

## 2021-02-06 LAB — BLOOD GAS, ARTERIAL
Acid-Base Excess: 2.7 mmol/L — ABNORMAL HIGH (ref 0.0–2.0)
Bicarbonate: 26.2 mmol/L (ref 20.0–28.0)
FIO2: 0.44
O2 Saturation: 92.6 %
Patient temperature: 37
pCO2 arterial: 36 mmHg (ref 32.0–48.0)
pH, Arterial: 7.47 — ABNORMAL HIGH (ref 7.350–7.450)
pO2, Arterial: 61 mmHg — ABNORMAL LOW (ref 83.0–108.0)

## 2021-02-06 LAB — BASIC METABOLIC PANEL
Anion gap: 7 (ref 5–15)
BUN: 38 mg/dL — ABNORMAL HIGH (ref 8–23)
CO2: 27 mmol/L (ref 22–32)
Calcium: 9.3 mg/dL (ref 8.9–10.3)
Chloride: 101 mmol/L (ref 98–111)
Creatinine, Ser: 1.17 mg/dL (ref 0.61–1.24)
GFR, Estimated: >60 mL/min (ref >60)
Glucose, Bld: 257 mg/dL — ABNORMAL HIGH (ref 70–99)
Potassium: 4.6 mmol/L (ref 3.5–5.1)
Sodium: 135 mmol/L (ref 135–145)

## 2021-02-06 LAB — CBC
HCT: 42.2 % (ref 39.0–52.0)
Hemoglobin: 14.3 g/dL (ref 13.0–17.0)
MCH: 30.9 pg (ref 26.0–34.0)
MCHC: 33.9 g/dL (ref 30.0–36.0)
MCV: 91.1 fL (ref 80.0–100.0)
Platelets: 217 10*3/uL (ref 150–400)
RBC: 4.63 MIL/uL (ref 4.22–5.81)
RDW: 14.2 % (ref 11.5–15.5)
WBC: 6.5 10*3/uL (ref 4.0–10.5)
nRBC: 0 % (ref 0.0–0.2)

## 2021-02-06 LAB — GLUCOSE, CAPILLARY
Glucose-Capillary: 211 mg/dL — ABNORMAL HIGH (ref 70–99)
Glucose-Capillary: 254 mg/dL — ABNORMAL HIGH (ref 70–99)
Glucose-Capillary: 91 mg/dL (ref 70–99)
Glucose-Capillary: 121 mg/dL — ABNORMAL HIGH (ref 70–99)

## 2021-02-06 MED ORDER — FUROSEMIDE 40 MG PO TABS
40.0000 mg | ORAL_TABLET | Freq: Every day | ORAL | Status: DC
  Administered 2021-02-06: 40 mg via ORAL
  Filled 2021-02-06: qty 1

## 2021-02-06 MED ORDER — ALPRAZOLAM 0.5 MG PO TABS
0.5000 mg | ORAL_TABLET | Freq: Once | ORAL | Status: AC
  Administered 2021-02-06: 0.5 mg via ORAL
  Filled 2021-02-06: qty 1

## 2021-02-06 MED ORDER — FUROSEMIDE 20 MG PO TABS
20.0000 mg | ORAL_TABLET | Freq: Every day | ORAL | Status: DC
  Administered 2021-02-07 – 2021-02-09 (×3): 20 mg via ORAL
  Filled 2021-02-06 (×4): qty 1

## 2021-02-06 NOTE — Evaluation (Signed)
Physical Therapy Evaluation Patient Details Name: Harold Parker MRN: [id number] DOB: [birth year] Today's Date: 02/06/2021   History of Present Illness  Patient is a 75 y.o. male with medical history significant for CAD s/p CABG x3, bioprosthetic AVR 2019, HTN, COPD on home O2 previously at 2 L, now on 4 L following a hospitalization from 1/26-2/8 for Covid pneumonia, anxiety, history of bleeding duodenal ulcer, systolic heart failure, last EF 45 to 50%, who presents with continued and worsening shortness of breath since his discharge on 01/24/2021. Acute on chronic respiratory failure with hypoxia    Clinical Impression  PT evaluation completed. Patient on 4 L02 on arrival to room and appears mildly anxious. Prior to walking, Sp02 measured at 89% on 4L02 taken in standing position. Patient requested to increased oxygen to 6L with ambulation. Patient walked around nursing station but required multiple standing rest breaks due to fatigue and anxiety with mobility. Increased work of breathing noted with Sp02 90-93% during walking on 6L02. Verbal cues for pursed lip breathing and slow controlled breathing techniques. No gross loss of balance without assistive device, however patient does reach out for hallway railing at times with mobility. Patient appears less anxious after walking with Sp02 94% on 6 L02 taken in sitting position. Patient requested to remain on the 6L 02 for comfort. Patient does express anxiety about going home and feeling shortness of breath while at home. Recommend PT follow up while patient admitted to maximize independence and address functional limitations listed below.     Follow Up Recommendations No PT follow up (recommend to resume outpatient pulmonary rehab as appropriate after discharge)    Equipment Recommendations  None recommended by PT    Recommendations for Other Services       Precautions / Restrictions Precautions Precautions: Fall Precaution Comments: Per Dr.  Renae Gloss, okay to adjust 02 up to 6 L with activity with ambulation today Restrictions Weight Bearing Restrictions: No      Mobility  Bed Mobility               General bed mobility comments: not observed this session, however patient reports he got OOB to chair independently today.    Transfers Overall transfer level: Modified independent Equipment used: None Transfers: Sit to/from Stand Sit to Stand: Modified independent (Device/Increase time)            Ambulation/Gait Ambulation/Gait assistance: Min guard;Supervision Gait Distance (Feet): 200 Feet Assistive device: None Gait Pattern/deviations: Step-through pattern Gait velocity: decreased   General Gait Details: patient required multiple standing rest breaks to complete hallway ambulation around nursing station. patient initially on 4 L prior to ambulation with Sp02 88-89%. Oxygen was increased to 6 L 02 for ambulation per patient request with Sp02 93% during standing rest break. Verbal cues for breathing techniques for pursed lip breath and for slow controlled breathing.  Stairs            Wheelchair Mobility    Modified Rankin (Stroke Patients Only)       Balance           Standing balance support: During functional activity;No upper extremity supported Standing balance-Leahy Scale: Good                               Pertinent Vitals/Pain Pain Assessment: No/denies pain    Home Living Family/patient expects to be discharged to:: Private residence Living Arrangements: Spouse/significant other Available Help at Discharge: Family;Available 24  hours/day Type of Home: House Home Access: Stairs to enter Entrance Stairs-Rails: Doctor, general practice of Steps: 5 Home Layout: One level   Additional Comments: most recently patient has been on 4 L02 at home    Prior Function Level of Independence: Independent         Comments: independent without assistive device  for ambulation. patient reports he participates with pulmonary rehab program as outpatient     Hand Dominance   Dominant Hand: Right    Extremity/Trunk Assessment   Upper Extremity Assessment Upper Extremity Assessment: Overall WFL for tasks assessed    Lower Extremity Assessment Lower Extremity Assessment:  (no focal weakenss noted, however endurance impaired for sustained activity in standing)       Communication   Communication: No difficulties  Cognition Arousal/Alertness: Awake/alert Behavior During Therapy: Anxious Overall Cognitive Status: Within Functional Limits for tasks assessed                                        General Comments      Exercises     Assessment/Plan    PT Assessment Patient needs continued PT services  PT Problem List Cardiopulmonary status limiting activity;Decreased mobility       PT Treatment Interventions DME instruction;Gait training;Functional mobility training;Stair training;Therapeutic activities;Therapeutic exercise;Balance training;Neuromuscular re-education    PT Goals (Current goals can be found in the Care Plan section)  Acute Rehab PT Goals Patient Stated Goal: better breathing PT Goal Formulation: With patient Time For Goal Achievement: 02/20/21 Potential to Achieve Goals: Good    Frequency Min 2X/week   Barriers to discharge        Co-evaluation               AM-PAC PT "6 Clicks" Mobility  Outcome Measure Help needed turning from your back to your side while in a flat bed without using bedrails?: None Help needed moving from lying on your back to sitting on the side of a flat bed without using bedrails?: None Help needed moving to and from a bed to a chair (including a wheelchair)?: None Help needed standing up from a chair using your arms (e.g., wheelchair or bedside chair)?: None Help needed to walk in hospital room?: A Little Help needed climbing 3-5 steps with a railing? : A  Little 6 Click Score: 22    End of Session Equipment Utilized During Treatment: Oxygen Activity Tolerance: Patient tolerated treatment well Patient left: with call bell/phone within reach;in chair Nurse Communication: Mobility status;Other (comment) (patient also requesting to stay on 6 L02 at end of session) PT Visit Diagnosis: Muscle weakness (generalized) (M62.81);Difficulty in walking, not elsewhere classified (R26.2)    Time: 5537-4827 PT Time Calculation (min) (ACUTE ONLY): 27 min   Charges:   PT Evaluation $PT Eval Moderate Complexity: 1 Mod PT Treatments $Therapeutic Activity: 8-22 mins        Donna Bernard, PT, MPT   Ina Homes 02/06/2021, 12:43 PM

## 2021-02-06 NOTE — Progress Notes (Signed)
Pt calls to desk and states he needs help. Pt appears to be having some anxiety related to not feeling like he can breath, nurse talked to pt and reminded to take slower breathes in the nose and out of mouth. Pt is able to become more relaxed after breathing exercise. Pt is encouraged to breath through nose, oxygen tubing adjusted and pt ambulated independently to chair from bed. Urinal provided at pts request.

## 2021-02-06 NOTE — Progress Notes (Signed)
Inpatient Diabetes Program Recommendations  AACE/ADA: New Consensus Statement on Inpatient Glycemic Control (2015)  Target Ranges:  Prepandial:   less than 140 mg/dL      Peak postprandial:   less than 180 mg/dL (1-2 hours)      Critically ill patients:  140 - 180 mg/dL   Results for TYQUON, NEAR (MRN [id number]) as of 02/06/2021 12:37  Ref. Range 02/06/2021 08:00 02/06/2021 11:29  Glucose-Capillary Latest Ref Range: 70 - 99 mg/dL 627 (H)  5 units NOVOLOG  254 (H)  8 units NOVOLOG  10 units LANTUS    Results for DEANO, TOMASZEWSKI (MRN [id number]) as of 02/06/2021 12:37  Ref. Range 01/12/2021 04:45  Hemoglobin A1C Latest Ref Range: 4.8 - 5.6 % 7.5 (H)   Home DM Meds: Glipizide 5 mg BID        Metformin 1000 mg BID   Current Orders: Lantus 10 units Daily       Novolog 0-15 units ac/hs      Glipizide 10 mg Daily    MD- Note AM CBGs remain >200 this AM.  Please consider increasing Lantus slightly to 12 units QHS  20% increase    --Will follow patient during hospitalization--  Ambrose Finland RN, MSN, CDE Diabetes Coordinator Inpatient Glycemic Control Team Team Pager: [phone] (8a-5p)

## 2021-02-06 NOTE — Progress Notes (Signed)
Progress Note  Patient Name: Harold Parker Date of Encounter: 02/06/2021  [facility] Cardiologist: [name], MD   Subjective   Some improvement in shortness of breath but he continues to have dry cough..  Improved.  Inpatient Medications    Scheduled Meds: . ALPRAZolam  0.5 mg Oral BID  . amLODipine  5 mg Oral Daily  . arformoterol  15 mcg Nebulization BID  . aspirin EC  81 mg Oral Daily  . budesonide (PULMICORT) nebulizer solution  0.5 mg Nebulization BID  . busPIRone  5 mg Oral BID  . cholecalciferol  1,000 Units Oral BH-q7a  . enoxaparin (LOVENOX) injection  40 mg Subcutaneous Q24H  . ezetimibe  10 mg Oral QHS  . folic acid  500 mcg Oral QPM  . furosemide  40 mg Oral Daily  . glipiZIDE  10 mg Oral Q breakfast  . insulin aspart  0-15 Units Subcutaneous TID WC  . insulin aspart  0-5 Units Subcutaneous QHS  . insulin glargine  10 Units Subcutaneous Daily  . lidocaine  1 patch Transdermal Q24H  . losartan  100 mg Oral Daily  . melatonin  10 mg Oral QHS  . metoprolol succinate  100 mg Oral Daily  . pantoprazole  40 mg Oral Daily  . revefenacin  175 mcg Nebulization Daily  . sucralfate  1 g Oral TID WC & HS  . traZODone  50 mg Oral QHS   Continuous Infusions:  PRN Meds: acetaminophen **OR** acetaminophen, albuterol, guaiFENesin-dextromethorphan, ondansetron **OR** ondansetron (ZOFRAN) IV   Vital Signs    Vitals:   02/05/21 2022 02/06/21 0349 02/06/21 0736 02/06/21 0831  BP: (!) 112/53 (!) 138/58 137/69   Pulse: 68 75 89   Resp: (!) 22 (!) 24 18   Temp: 97.9 F (36.6 C) (!) 97.5 F (36.4 C) 98 F (36.7 C)   TempSrc: Oral Oral    SpO2: 97% 96% 94% 93%  Weight:  81.2 kg    Height:        Intake/Output Summary (Last 24 hours) at 02/06/2021 0957 Last data filed at 02/05/2021 1432 Gross per 24 hour  Intake --  Output 0 ml  Net 0 ml   Last 3 Weights 02/06/2021 02/05/2021 02/04/2021  Weight (lbs) 179 lb 0.2 oz 174 lb 9.6 oz 169 lb 12.1 oz  Weight (kg)  81.2 kg 79.198 kg 77 kg      Telemetry    Normal sinus rhythm with 1 short of NSVT 4 beats- Personally Reviewed  ECG     - Personally Reviewed  Physical Exam   GEN: No acute distress.   Neck: No JVD Cardiac: RRR, no murmurs, rubs, or gallops.  Respiratory:  Bilateral rhonchi but improved aeration overall since yesterday.  No crackles. GI: Soft, nontender, non-distended  MS: No edema; No deformity. Neuro:  Nonfocal  Psych: Normal affect   Labs    High Sensitivity Troponin:   Recent Labs  Lab 01/11/21 0538 01/11/21 1335 01/11/21 1415 02/04/21 0406 02/04/21 0935  TROPONINIHS 21* 67* 30* 24* 79*      Chemistry Recent Labs  Lab 02/04/21 0406 02/05/21 0450 02/06/21 0531  NA 131* 132* 135  K 3.8 4.5 4.6  CL 99 100 101  CO2 22 24 27   GLUCOSE 223* 343* 257*  BUN 16 38* 38*  CREATININE 0.97 1.32* 1.17  CALCIUM 8.2* 9.2 9.3  PROT 6.5  --   --   ALBUMIN 2.8*  --   --   AST 22  --   --  ALT 34  --   --   ALKPHOS 66  --   --   BILITOT 1.2  --   --   GFRNONAA >60 56* >60  ANIONGAP 10 8 7      Hematology Recent Labs  Lab 02/04/21 0406 02/05/21 0450 02/06/21 0531  WBC 9.0 9.7 6.5  RBC 4.30 3.87* 4.63  HGB 13.2 11.9* 14.3  HCT 38.5* 34.2* 42.2  MCV 89.5 88.4 91.1  MCH 30.7 30.7 30.9  MCHC 34.3 34.8 33.9  RDW 13.7 13.8 14.2  PLT 148* 191 217    BNP Recent Labs  Lab 02/04/21 0406  BNP 293.2*     DDimer No results for input(s): DDIMER in the last 168 hours.   Radiology    ECHOCARDIOGRAM COMPLETE  Result Date: 02/05/2021    ECHOCARDIOGRAM REPORT   Patient Name:   Harold Parker Date of Exam: 02/05/2021 Medical Rec #:  [id number]     Height:       66.0 in Accession #:    [id number]    Weight:       174.6 lb Date of Birth:  February 28, 1946     BSA:          1.888 m Patient Age:    75 years      BP:           120/49 mmHg Patient Gender: M             HR:           80 bpm. Exam Location:  [facility] Procedure: 2D Echo, Cardiac Doppler and Color Doppler Indications:      Cardiomyopathy-Ischemic I25.5  History:         Patient has prior history of Echocardiogram examinations. CAD;                  Risk Factors:Hypertension and Diabetes.  Sonographer:     Neysa Bonito Roar Referring Phys:  [id number] CARMEN Knox Saliva Diagnosing Phys: [name] MD IMPRESSIONS  1. Left ventricular ejection fraction, by estimation, is 40 to 45%. The left ventricle has mildly decreased function. Left ventricular endocardial border not optimally defined to evaluate regional wall motion. Left ventricular diastolic parameters are consistent with Grade I diastolic dysfunction (impaired relaxation).  2. Right ventricular systolic function is normal. The right ventricular size is normal. There is moderately elevated pulmonary artery systolic pressure. The estimated right ventricular systolic pressure is 58.4 mmHg.  3. Left atrial size was mildly dilated.  4. The mitral valve is normal in structure. Mild mitral valve regurgitation. No evidence of mitral stenosis.  5. Tricuspid valve regurgitation is moderate.  6. The aortic valve was not well visualized. Aortic valve regurgitation is not visualized. No aortic stenosis is present.  7. The inferior vena cava is normal in size with greater than 50% respiratory variability, suggesting right atrial pressure of 3 mmHg. FINDINGS  Left Ventricle: Left ventricular ejection fraction, by estimation, is 40 to 45%. The left ventricle has mildly decreased function. Left ventricular endocardial border not optimally defined to evaluate regional wall motion. The left ventricular internal cavity size was normal in size. There is no left ventricular hypertrophy. Left ventricular diastolic parameters are consistent with Grade I diastolic dysfunction (impaired relaxation). Right Ventricle: The right ventricular size is normal. No increase in right ventricular wall thickness. Right ventricular systolic function is normal. There is moderately elevated pulmonary artery systolic pressure.  The tricuspid regurgitant velocity is 3.72 m/s, and with an assumed right atrial pressure  of 3 mmHg, the estimated right ventricular systolic pressure is 58.4 mmHg. Left Atrium: Left atrial size was mildly dilated. Right Atrium: Right atrial size was normal in size. Pericardium: There is no evidence of pericardial effusion. Mitral Valve: The mitral valve is normal in structure. Mild mitral valve regurgitation. No evidence of mitral valve stenosis. Tricuspid Valve: The tricuspid valve is normal in structure. Tricuspid valve regurgitation is moderate . No evidence of tricuspid stenosis. Aortic Valve: The aortic valve was not well visualized. Aortic valve regurgitation is not visualized. No aortic stenosis is present. Aortic valve peak gradient measures 11.8 mmHg. Pulmonic Valve: The pulmonic valve was normal in structure. Pulmonic valve regurgitation is mild. No evidence of pulmonic stenosis. Aorta: The aortic root is normal in size and structure. Venous: The inferior vena cava is normal in size with greater than 50% respiratory variability, suggesting right atrial pressure of 3 mmHg. IAS/Shunts: No atrial level shunt detected by color flow Doppler.  LEFT VENTRICLE PLAX 2D LVIDd:         4.35 cm  Diastology LVIDs:         3.36 cm  LV e' medial:    4.12 cm/s LV PW:         1.10 cm  LV E/e' medial:  15.2 LV IVS:        1.00 cm  LV e' lateral:   6.66 cm/s LVOT diam:     1.70 cm  LV E/e' lateral: 9.4 LVOT Area:     2.27 cm  RIGHT VENTRICLE RV Mid diam:    4.01 cm RV S prime:     6.83 cm/s TAPSE (M-mode): 1.5 cm LEFT ATRIUM             Index       RIGHT ATRIUM           Index LA diam:        3.85 cm 2.04 cm/m  RA Area:     19.80 cm LA Vol (A2C):   69.0 ml 36.55 ml/m RA Volume:   63.60 ml  33.69 ml/m LA Vol (A4C):   49.8 ml 26.38 ml/m LA Biplane Vol: 59.9 ml 31.73 ml/m  AORTIC VALVE                PULMONIC VALVE AV Area (Vmax): 1.03 cm    PV Vmax:        1.02 m/s AV Vmax:        172.00 cm/s PV Peak grad:   4.2 mmHg  AV Peak Grad:   11.8 mmHg   RVOT Peak grad: 2 mmHg LVOT Vmax:      78.00 cm/s  AORTA Ao Root diam: 2.90 cm MITRAL VALVE               TRICUSPID VALVE MV Area (PHT): 4.63 cm    TR Peak grad:   55.4 mmHg MV Decel Time: 164 msec    TR Vmax:        372.00 cm/s MV E velocity: 62.60 cm/s MV A velocity: 91.70 cm/s  SHUNTS MV E/A ratio:  0.68        Systemic Diam: 1.70 cm MV A Prime:    11.4 cm/s [name] MD Electronically signed by [name] MD Signature Date/Time: 02/05/2021/1:50:38 PM    Final     Cardiac Studies   Echocardiogram was done yesterday and was personally reviewed by me.  It showed an EF of 40 to 45% with moderate pulmonary hypertension, mild mitral  regurgitation and moderate tricuspid regurgitation.  Bioprosthetic aortic valve was not well visualized but appears to be functioning normally.  Patient Profile     75 y.o. 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 fib 02/2018 , GIB/duodenal ulcers while on DAPT, PAD s/p 02/2019 angiography and intervention to LSFA, hypertension, hyperlipidemia, DM2, COPD, previous tobacco abuse (quit 3 years ago), and who is being followed for elevated troponin in the setting of respiratory failure due to recent COVID-19 pneumonia.    Assessment & Plan    1.  Chronic systolic heart failure: Mildly reduced LV systolic function.  Echocardiogram during this admission showed only slight decrease in ejection fraction from before 40 to 45%.  In addition, there was evidence of moderate pulmonary hypertension.  Suspect that his pulmonary hypertension is a mixed etiology due to left-sided heart failure as well as his lung disease.  His furosemide was held 1 day for quite increasing creatinine but his creatinine back to baseline and ongoing to resume furosemide 40 mg once daily.   2.  Coronary artery disease status post CABG.  Mildly elevated troponin is likely due to supply demand ischemia.  No  evidence of acute coronary syndrome.  No plans for further ischemic cardiac evaluation.  3.  Acute on chronic hypoxic respiratory failure due to recent COVID-19 pneumonia: Seems to be improving.  He is still requiring 4 to 6 L of oxygen.  Appreciate pulmonary input.  4.  Anxiety and insomnia: The patient reports significant improvement after he received 1 dose of trazodone.       For questions or updates, please contact [facility] Please consult [email or web address] for contact info under        Signed, [name], MD  02/06/2021, 9:57 AM

## 2021-02-06 NOTE — TOC Initial Note (Signed)
Transition of Care [facility]) - Initial/Assessment Note    Patient Details  Name: Harold Parker MRN: [id number] Date of Birth: Oct 30, 1946  Transition of Care [facility]) CM/SW Contact:    Hetty Ely, RN Phone Number: 02/06/2021, 3:52 PM  Clinical Narrative:  Spoke with patient in room,sitting in recliner with oxygen. Alert and oriented x3, states he lives at home with Harold Parker. Uses 4l oxygen, service by [employer]. Drives himself to medical visits and [facility] near [employer]. No other equipment at home, both he and fiance cooks and shops together. I did confirm Oxygen service with [employer], [place] states they have serviced patient since 2020 and will continue and provide tank for discharge home. Discharge possibly tomorrow 02/07/21, patient states Fiance will provide transport.             Expected Discharge Plan: Home/Self Care Barriers to Discharge: Continued Medical Work up   Patient Goals and CMS Choice Patient states their goals for this hospitalization and ongoing recovery are:: To return home with Fiance   Choice offered to / list presented to : NA  Expected Discharge Plan and Services Expected Discharge Plan: Home/Self Care In-house Referral: Clinical Social Work Discharge Planning Services: NA Post Acute Care Choice: Durable Medical Equipment Living arrangements for the past 2 months: Single Family Home                 DME Arranged: Oxygen DME Agency: [employer] Date DME Agency Contacted: 02/06/21 Time DME Agency Contacted: 1200 Representative spoke with at DME Agency: Harold Parker Arranged: NA Parker Agency: NA        Prior Living Arrangements/Services Living arrangements for the past 2 months: Single Family Home Lives with:: Spouse Patient language and need for interpreter reviewed:: Yes Do you feel safe going back to the place where you live?: Yes      Need for Family Participation in Patient Care: No (Comment) Care giver support system  in place?: Yes (comment) Current home services: DME Criminal Activity/Legal Involvement Pertinent to Current Situation/Hospitalization: No - Comment as needed  Activities of Daily Living Home Assistive Devices/Equipment: None ADL Screening (condition at time of admission) Patient's cognitive ability adequate to safely complete daily activities?: Yes Is the patient deaf or have difficulty hearing?: No Does the patient have difficulty seeing, even when wearing glasses/contacts?: No Does the patient have difficulty concentrating, remembering, or making decisions?: No Patient able to express need for assistance with ADLs?: Yes Does the patient have difficulty dressing or bathing?: No Independently performs ADLs?: Yes (appropriate for developmental age) Does the patient have difficulty walking or climbing stairs?: No Weakness of Legs: None Weakness of Arms/Hands: None  Permission Sought/Granted Permission sought to share information with : Case Manager Permission granted to share information with : No              Emotional Assessment Appearance:: Appears stated age Attitude/Demeanor/Rapport: Engaged Affect (typically observed): Accepting Orientation: : Oriented to Self,Oriented to Place,Oriented to  Time Alcohol / Substance Use: Not Applicable Psych Involvement: No (comment)  Admission diagnosis:  Acute on chronic respiratory failure with hypoxia (HCC) [J96.21] Patient Active Problem List   Diagnosis Date Noted  . Insomnia   . History of COVID-19 pneumonia 01/11/21 02/04/2021  . Pulmonary nodules   . Acute respiratory disease due to COVID-19 virus 01/11/2021  . HTN (hypertension)   . Chronic systolic CHF (congestive heart failure) (HCC)   . COPD (chronic obstructive pulmonary disease) (HCC)   .  GERD (gastroesophageal reflux disease)   . Anxiety   . AKI (acute kidney injury) (HCC)   . Hyperkalemia   . Elevated troponin   . Acute hypoxemic respiratory failure due to COVID-19  (HCC)   . Melena   . Duodenal ulceration   . Gastrointestinal hemorrhage   . Severe anemia 03/14/2018  . 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 S/P aortic valve replacement with bioprosthetic valve  02/19/2018  . Preoperative respiratory examination 02/13/2018  . Acute on chronic respiratory failure with hypoxia (HCC) 02/11/2018  . Influenza with respiratory manifestation 02/11/2018  . Stage 3 severe COPD by GOLD classification (HCC)   . Aortic stenosis   . Bronchitis, chronic obstructive, with exacerbation (HCC)   . Tobacco abuse   . CAD (coronary artery disease) 02/07/2018  . Acute systolic CHF (congestive heart failure) (HCC) 02/07/2018  . Ischemic cardiomyopathy 02/07/2018  . Type 2 diabetes mellitus with hyperglycemia, without long-term current use of insulin (HCC) 02/07/2018  . Hyperlipidemia LDL goal <70 02/07/2018  . COPD with acute exacerbation (HCC) 02/05/2018  . NSTEMI (non-ST elevated myocardial infarction) (HCC) 02/05/2018   PCP:  Merlene Laughter, MD Pharmacy:   [id number] - [place], [place] - [address] [facility] AT [facility] [address] [facility] [address] [place] [address] Phone: [phone] Fax: [phone]  [facility] [phone] - [place], [place] - [address] [place] [address] Phone: [phone] Fax: [phone]     Social Determinants of Health (SDOH) Interventions    Readmission Risk Interventions Readmission Risk Prevention Plan 02/06/2021 01/13/2021  Transportation Screening Complete Complete  PCP or Specialist Appt within 3-5 Days Complete Complete  HRI or Home Care Consult Complete Complete  Social Work Consult for Recovery Care Planning/Counseling Complete Complete  Palliative Care Screening Not Applicable Not Applicable  Medication Review Oceanographer) Complete Complete  Some recent data might be hidden

## 2021-02-06 NOTE — Progress Notes (Signed)
Patient ID: Rally Ouch, male   DOB: [birth year], 75 y.o.   MRN: [id number] [facility] PROGRESS NOTE  Journey Ratterman GEZ:[id number] DOB: 12/14/1946 DOA: 02/04/2021 PCP: Merlene Laughter, MD  HPI/Subjective: Patient does not think he is ready to get out of the hospital today.  Short of breath.  Has trouble with eating this morning and only ate a few bites.  Felt like he got caught in his lower chest and upper abdomen.  He has had this happen in the past.  Admitted with acute hypoxic respiratory failure.  Objective: Vitals:   02/06/21 1259 02/06/21 1630  BP: (!) 143/70 133/65  Pulse: 90 (!) 106  Resp: 20 18  Temp: 97.9 F (36.6 C) 98.3 F (36.8 C)  SpO2: 95% 95%    Intake/Output Summary (Last 24 hours) at 02/06/2021 1702 Last data filed at 02/06/2021 1255 Gross per 24 hour  Intake 315 ml  Output 150 ml  Net 165 ml   Filed Weights   02/04/21 0925 02/05/21 0402 02/06/21 0349  Weight: 77 kg 79.2 kg 81.2 kg    ROS: Review of Systems  Respiratory: Positive for cough and shortness of breath.   Cardiovascular: Negative for chest pain.  Gastrointestinal: Negative for abdominal pain, nausea and vomiting.   Exam: Physical Exam HENT:     Head: Normocephalic.     Mouth/Throat:     Pharynx: No oropharyngeal exudate.  Eyes:     General: Lids are normal.     Conjunctiva/sclera: Conjunctivae normal.  Cardiovascular:     Rate and Rhythm: Normal rate and regular rhythm.     Heart sounds: Normal heart sounds, S1 normal and S2 normal.  Pulmonary:     Breath sounds: Examination of the right-lower field reveals decreased breath sounds. Examination of the left-lower field reveals decreased breath sounds. Decreased breath sounds present. No wheezing, rhonchi or rales.  Abdominal:     Palpations: Abdomen is soft.     Tenderness: There is no abdominal tenderness.  Musculoskeletal:     Right ankle: No swelling.     Left ankle: No swelling.  Skin:    General: Skin is warm.     Findings:  No rash.  Neurological:     Mental Status: He is alert and oriented to person, place, and time.       Data Reviewed: Basic Metabolic Panel: Recent Labs  Lab 02/04/21 0406 02/04/21 1305 02/05/21 0450 02/06/21 0531  NA 131*  --  132* 135  K 3.8  --  4.5 4.6  CL 99  --  100 101  CO2 22  --  24 27  GLUCOSE 223*  --  343* 257*  BUN 16  --  38* 38*  CREATININE 0.97  --  1.32* 1.17  CALCIUM 8.2*  --  9.2 9.3  MG  --  2.0 2.3  --   PHOS  --   --  4.0  --    Liver Function Tests: Recent Labs  Lab 02/04/21 0406  AST 22  ALT 34  ALKPHOS 66  BILITOT 1.2  PROT 6.5  ALBUMIN 2.8*   CBC: Recent Labs  Lab 02/04/21 0406 02/05/21 0450 02/06/21 0531  WBC 9.0 9.7 6.5  NEUTROABS 6.1  --   --   HGB 13.2 11.9* 14.3  HCT 38.5* 34.2* 42.2  MCV 89.5 88.4 91.1  PLT 148* 191 217   BNP (last 3 results) Recent Labs    01/11/21 0538 01/13/21 0434 02/04/21 0406  BNP 181.6* 591.1* 293.2*  CBG: Recent Labs  Lab 02/05/21 1651 02/05/21 2027 02/06/21 0800 02/06/21 1129 02/06/21 1644  GLUCAP 132* 111* 211* 254* 91     Studies: ECHOCARDIOGRAM COMPLETE  Result Date: 02/05/2021    ECHOCARDIOGRAM REPORT   Patient Name:   ARSALAN BRISBIN Date of Exam: 02/05/2021 Medical Rec #:  [id number]     Height:       66.0 in Accession #:    [id number]    Weight:       174.6 lb Date of Birth:  1946-02-23     BSA:          1.888 m Patient Age:    74 years      BP:           120/49 mmHg Patient Gender: M             HR:           80 bpm. Exam Location:  [facility] Procedure: 2D Echo, Cardiac Doppler and Color Doppler Indications:     Cardiomyopathy-Ischemic I25.5  History:         Patient has prior history of Echocardiogram examinations. CAD;                  Risk Factors:Hypertension and Diabetes.  Sonographer:     Neysa Bonito Roar Referring Phys:  [id number] CARMEN Knox Saliva Diagnosing Phys: [name] MD IMPRESSIONS  1. Left ventricular ejection fraction, by estimation, is 40 to 45%. The left ventricle has  mildly decreased function. Left ventricular endocardial border not optimally defined to evaluate regional wall motion. Left ventricular diastolic parameters are consistent with Grade I diastolic dysfunction (impaired relaxation).  2. Right ventricular systolic function is normal. The right ventricular size is normal. There is moderately elevated pulmonary artery systolic pressure. The estimated right ventricular systolic pressure is 58.4 mmHg.  3. Left atrial size was mildly dilated.  4. The mitral valve is normal in structure. Mild mitral valve regurgitation. No evidence of mitral stenosis.  5. Tricuspid valve regurgitation is moderate.  6. The aortic valve was not well visualized. Aortic valve regurgitation is not visualized. No aortic stenosis is present.  7. The inferior vena cava is normal in size with greater than 50% respiratory variability, suggesting right atrial pressure of 3 mmHg. FINDINGS  Left Ventricle: Left ventricular ejection fraction, by estimation, is 40 to 45%. The left ventricle has mildly decreased function. Left ventricular endocardial border not optimally defined to evaluate regional wall motion. The left ventricular internal cavity size was normal in size. There is no left ventricular hypertrophy. Left ventricular diastolic parameters are consistent with Grade I diastolic dysfunction (impaired relaxation). Right Ventricle: The right ventricular size is normal. No increase in right ventricular wall thickness. Right ventricular systolic function is normal. There is moderately elevated pulmonary artery systolic pressure. The tricuspid regurgitant velocity is 3.72 m/s, and with an assumed right atrial pressure of 3 mmHg, the estimated right ventricular systolic pressure is 58.4 mmHg. Left Atrium: Left atrial size was mildly dilated. Right Atrium: Right atrial size was normal in size. Pericardium: There is no evidence of pericardial effusion. Mitral Valve: The mitral valve is normal in structure.  Mild mitral valve regurgitation. No evidence of mitral valve stenosis. Tricuspid Valve: The tricuspid valve is normal in structure. Tricuspid valve regurgitation is moderate . No evidence of tricuspid stenosis. Aortic Valve: The aortic valve was not well visualized. Aortic valve regurgitation is not visualized. No aortic stenosis is present. Aortic valve peak gradient measures 11.8 mmHg.  Pulmonic Valve: The pulmonic valve was normal in structure. Pulmonic valve regurgitation is mild. No evidence of pulmonic stenosis. Aorta: The aortic root is normal in size and structure. Venous: The inferior vena cava is normal in size with greater than 50% respiratory variability, suggesting right atrial pressure of 3 mmHg. IAS/Shunts: No atrial level shunt detected by color flow Doppler.  LEFT VENTRICLE PLAX 2D LVIDd:         4.35 cm  Diastology LVIDs:         3.36 cm  LV e' medial:    4.12 cm/s LV PW:         1.10 cm  LV E/e' medial:  15.2 LV IVS:        1.00 cm  LV e' lateral:   6.66 cm/s LVOT diam:     1.70 cm  LV E/e' lateral: 9.4 LVOT Area:     2.27 cm  RIGHT VENTRICLE RV Mid diam:    4.01 cm RV S prime:     6.83 cm/s TAPSE (M-mode): 1.5 cm LEFT ATRIUM             Index       RIGHT ATRIUM           Index LA diam:        3.85 cm 2.04 cm/m  RA Area:     19.80 cm LA Vol (A2C):   69.0 ml 36.55 ml/m RA Volume:   63.60 ml  33.69 ml/m LA Vol (A4C):   49.8 ml 26.38 ml/m LA Biplane Vol: 59.9 ml 31.73 ml/m  AORTIC VALVE                PULMONIC VALVE AV Area (Vmax): 1.03 cm    PV Vmax:        1.02 m/s AV Vmax:        172.00 cm/s PV Peak grad:   4.2 mmHg AV Peak Grad:   11.8 mmHg   RVOT Peak grad: 2 mmHg LVOT Vmax:      78.00 cm/s  AORTA Ao Root diam: 2.90 cm MITRAL VALVE               TRICUSPID VALVE MV Area (PHT): 4.63 cm    TR Peak grad:   55.4 mmHg MV Decel Time: 164 msec    TR Vmax:        372.00 cm/s MV E velocity: 62.60 cm/s MV A velocity: 91.70 cm/s  SHUNTS MV E/A ratio:  0.68        Systemic Diam: 1.70 cm MV A Prime:     11.4 cm/s [name] MD Electronically signed by [name] MD Signature Date/Time: 02/05/2021/1:50:38 PM    Final     Scheduled Meds: . ALPRAZolam  0.5 mg Oral BID  . amLODipine  5 mg Oral Daily  . arformoterol  15 mcg Nebulization BID  . aspirin EC  81 mg Oral Daily  . budesonide (PULMICORT) nebulizer solution  0.5 mg Nebulization BID  . busPIRone  5 mg Oral BID  . cholecalciferol  1,000 Units Oral BH-q7a  . enoxaparin (LOVENOX) injection  40 mg Subcutaneous Q24H  . ezetimibe  10 mg Oral QHS  . folic acid  500 mcg Oral QPM  . furosemide  40 mg Oral Daily  . glipiZIDE  10 mg Oral Q breakfast  . insulin aspart  0-15 Units Subcutaneous TID WC  . insulin aspart  0-5 Units Subcutaneous QHS  . insulin glargine  10 Units Subcutaneous  Daily  . lidocaine  1 patch Transdermal Q24H  . losartan  100 mg Oral Daily  . melatonin  10 mg Oral QHS  . metoprolol succinate  100 mg Oral Daily  . pantoprazole  40 mg Oral Daily  . revefenacin  175 mcg Nebulization Daily  . sucralfate  1 g Oral TID WC & HS  . traZODone  50 mg Oral QHS    Assessment/Plan:  1. Acute on chronic hypoxic respiratory failure.  Patient had recent COVID pneumonia on 01/11/2021.  Patient was previously on 2 L oxygen but discharged home on 4 L.  When the patient came in his pulse ox was 87% on 4 L.  Patient down to 4 L now but when ambulated at 6 L held his oxygen saturations. 2. COPD exacerbation and recent Covid infection.  I did not start steroids because sugars were elevated.  Dr. Jayme Cloud started on nebulizer treatments. 3. Insomnia and anxiety.  Continue trazodone at night.  Continue other antianxiety medications. 4. Type 2 diabetes mellitus with hyperglycemia.  Patient states he takes glipizide twice a day but I did it once a day.  We will get rid of Lantus insulin and continue to monitor. 5. Pulmonary nodule seen on CT scan.  Pulmonary thinks likely from recent Covid infection 6. Acute kidney injury with  creatinine of 0.97 on admission and 1.32 yesterday.  Held Lasix yesterday.  Creatinine better today at 1.17. 7. 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  Continue losartan, metoprolol.  Restart Lasix today. 8. Did well with physical therapy today        Code Status:     Code Status Orders  (From admission, onward)         Start     Ordered   02/04/21 0531  Full code  Continuous        02/04/21 0532        Code Status History    Date Active Date Inactive Code Status Order ID Comments User Context   01/11/2021 1113 01/24/2021 2245 Full Code [id number]  Lorretta Harp, MD ED   03/04/2019 1040 03/04/2019 1733 Full Code [id number]  Iran Ouch, MD Inpatient   03/14/2018 0135 03/18/2018 1900 Full Code [id number]  Cammy Copa, MD ED   02/07/2018 1636 02/26/2018 2129 Full Code [id number]  Creig Hines, NP Inpatient   02/05/2018 2047 02/07/2018 1608 Full Code [id number]  Shaune Pollack, MD Inpatient   Advance Care Planning Activity     Family Communication: Spoke with wife on the phone Disposition Plan: Status is: Inpatient  Dispo: The patient is from: Home              Anticipated d/c is to: Home              Anticipated d/c date is: I am hopeful for tomorrow 02/07/2021              Patient currently patient is very anxious about disposition.  I am trying to get him to be confident upon going home.   Difficult to place patient.  No.  Time spent: 28 minutes  Della Homan [employer]

## 2021-02-07 DIAGNOSIS — R079 Chest pain, unspecified: Secondary | ICD-10-CM

## 2021-02-07 LAB — HEMOGLOBIN: Hemoglobin: 13.5 g/dL (ref 13.0–17.0)

## 2021-02-07 LAB — BASIC METABOLIC PANEL
Anion gap: 8 (ref 5–15)
BUN: 26 mg/dL — ABNORMAL HIGH (ref 8–23)
CO2: 29 mmol/L (ref 22–32)
Calcium: 9.5 mg/dL (ref 8.9–10.3)
Chloride: 98 mmol/L (ref 98–111)
Creatinine, Ser: 1.05 mg/dL (ref 0.61–1.24)
GFR, Estimated: >60 mL/min (ref >60)
Glucose, Bld: 197 mg/dL — ABNORMAL HIGH (ref 70–99)
Potassium: 4.4 mmol/L (ref 3.5–5.1)
Sodium: 135 mmol/L (ref 135–145)

## 2021-02-07 LAB — TROPONIN I (HIGH SENSITIVITY)
Troponin I (High Sensitivity): 79 ng/L — ABNORMAL HIGH (ref <18)
Troponin I (High Sensitivity): 80 ng/L — ABNORMAL HIGH (ref <18)

## 2021-02-07 LAB — GLUCOSE, CAPILLARY
Glucose-Capillary: 230 mg/dL — ABNORMAL HIGH (ref 70–99)
Glucose-Capillary: 180 mg/dL — ABNORMAL HIGH (ref 70–99)
Glucose-Capillary: 261 mg/dL — ABNORMAL HIGH (ref 70–99)
Glucose-Capillary: 116 mg/dL — ABNORMAL HIGH (ref 70–99)

## 2021-02-07 MED ORDER — BUSPIRONE HCL 10 MG PO TABS
10.0000 mg | ORAL_TABLET | Freq: Two times a day (BID) | ORAL | Status: DC
  Administered 2021-02-07 – 2021-02-09 (×4): 10 mg via ORAL
  Filled 2021-02-07 (×5): qty 1

## 2021-02-07 NOTE — Consult Note (Signed)
[facility] Face-to-Face Psychiatry Consult   Reason for Consult: Consult for 75 year old man in the hospital with respiratory problems that are multifactorial.  Consult regarding anxiety Referring Physician:  Hilton Sinclair Patient Identification: Doron Shake MRN:  [id number] Principal Diagnosis: Acute on chronic respiratory failure with hypoxia [facility]) Diagnosis:  Principal Problem:   Acute on chronic respiratory failure with hypoxia (HCC) Active Problems:   COPD with acute exacerbation (HCC)   CAD (coronary artery disease)   Ischemic cardiomyopathy   Type 2 diabetes mellitus with hyperglycemia, without long-term current use of insulin (HCC)   Stage 3 severe COPD by GOLD classification (HCC)   S/P CABG x 3   S/P aortic valve replacement with bioprosthetic valve    Chronic systolic CHF (congestive heart failure) (HCC)   Anxiety   AKI (acute kidney injury) (HCC)   History of COVID-19 pneumonia 01/11/21   Pulmonary nodules   Insomnia   Total Time spent with patient: 1 hour  Subjective:   Ondre Salvetti is a 75 y.o. male patient admitted with "my anxiety has been bad".  HPI: Patient seen chart reviewed.  Patient was pleasant and forthcoming and a good historian.  75 year old man with several medical problems including coronary artery disease COPD and recent COVID all of which have caused worsening concerns about his respiratory function.  He has been having more anxiety attacks recently.  Sounds like there is some generalized anxiety but most of it is related to fears of suffocating or triggers that make him feel like he cannot breathe well.  He particularly mentions getting panicky in the shower now because his new RV has a tiny little shower.  Patient is not reporting depression.  Not reporting suicidal thoughts.  He does have some chronic difficulty sleeping.  Appetite seems adequate and overall appears to be enjoying his life well with positive things to look forward to.  No report of psychosis.  He  had been taking Xanax 0.5 mg twice a day for many months from his primary care doctor.  In the hospital and in the last week he had been started on low-dose buspirone and trazodone for sleep  Past Psychiatric History: Patient reports anxiety and depression problems after the death of his wife several years ago.  At that time had brief suicidal thoughts but never acted on it.  No hospitalization.  He reports that earlier in his life before the last few years he never was aware of having problems with anxiety or depression.  No history of alcohol or drug abuse.  Currently minimal use of alcohol at all.  Risk to Self:   Risk to Others:   Prior Inpatient Therapy:   Prior Outpatient Therapy:    Past Medical History:  Past Medical History:  Diagnosis Date  . Bilateral carotid bruits    a. 01/2018 U/S: < 50% bilat ICA stenosis.  Marland Kitchen CAD (coronary artery disease)    a. 1998 s/p MI and BMS [facility] [place]); b. 1999 redo PCI/rotablator in setting of what sounds like ISR;  c. Multiple stress tests over the years - last ~ 2017, reportedly nl; d. 01/2018 NSTEMI/Cath: LM 60m/d, LAD 50p, 40p/m, D1 60ost, OM1 95, RCA 100ost/p w/ L->R collats, EF 45%; e. s/p 3V CABG 02/19/18 (LIMA-LAD, VG-D1, VG-OM)  . Chronic lower back pain   . COPD (chronic obstructive pulmonary disease) (HCC)   . GIB (gastrointestinal bleeding)    a. 02/2018 GIB and anemia w/ Hgb of 4.7 on presentation; b. 03/2018 EGD: 2 nonbleeding duodenal ulcers.  Marland Kitchen  HTN (hypertension)   . Hypercholesteremia   . Ischemic cardiomyopathy    a. 01/2018 Echo: EF 40-45%, mid-apicalanteroseptal, ant, apical sev HK, mod apicalinferior HK. Gr2 DD. Mod AS, mild MR, mod dil LA, PASP 50mmHg.  . Moderate aortic stenosis    a. 01/2018 Echo: Mod AS, mean grad (S) 20mmHg, Valve area (VTI) 1.06 cm^2, (Vmax) 1.27 cm^2; b. 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  . Myocardial infarction (HCC) ~ 1998/1999  . 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 02/19/2018  AVR: 25 mm Edwards Inspiris Resilia stented bovine pericardial tissue valve  . 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 Tobacco abuse   . Type II diabetes mellitus (HCC)     Past Surgical History:  Procedure Laterality Date  . ABDOMINAL AORTOGRAM N/A 03/04/2019   Procedure: ABDOMINAL AORTOGRAM;  Surgeon: Iran OuchArida, Muhammad A, MD;  [facility]: [facility];  Service: Cardiovascular;  Laterality: N/A;  . AORTIC VALVE REPLACEMENT N/A 02/19/2018   Procedure: AORTIC VALVE REPLACEMENT (AVR);  Surgeon: Purcell Nailswen, Clarence H, MD;  [facility];  Service: Open Heart Surgery;  Laterality: N/A;  . COLONOSCOPY    . CORONARY ANGIOPLASTY WITH STENT PLACEMENT  ~ 1998/1999  . CORONARY ARTERY BYPASS GRAFT N/A 02/19/2018   Procedure: CORONARY ARTERY BYPASS GRAFTING (CABG) x three , using left internal mammary artery and right leg greater saphenous vein harvested endoscopically;  Surgeon: Purcell Nailswen, Clarence H, MD;  [facility];  Service: Open Heart Surgery;  Laterality: N/A;  . ESOPHAGOGASTRODUODENOSCOPY (EGD) WITH PROPOFOL N/A 03/18/2018   Procedure: ESOPHAGOGASTRODUODENOSCOPY (EGD) WITH PROPOFOL;  Surgeon: Midge MiniumWohl, Darren, MD;  Location: [facility] ENDOSCOPY;  Service: Endoscopy;  Laterality: N/A;  . LEFT HEART CATH AND CORONARY ANGIOGRAPHY N/A 02/06/2018   Procedure: LEFT HEART CATH AND CORONARY ANGIOGRAPHY;  Surgeon: Iran OuchArida, Muhammad A, MD;  Location: [facility] INVASIVE CV LAB;  Service: Cardiovascular;  Laterality: N/A;  . LOWER EXTREMITY ANGIOGRAPHY Bilateral 03/04/2019   Procedure: Lower Extremity Angiography;  Surgeon: Iran OuchArida, Muhammad A, MD;  Location: [facility];  Service: Cardiovascular;  Laterality: Bilateral;  . PERIPHERAL VASCULAR ATHERECTOMY Left 03/04/2019   Procedure: PERIPHERAL VASCULAR ATHERECTOMY;  Surgeon: Iran OuchArida, Muhammad A, MD;  Location: [facility];  Service: Cardiovascular;  Laterality: Left;  SFA  . TEE WITHOUT CARDIOVERSION N/A 02/19/2018    Procedure: TRANSESOPHAGEAL ECHOCARDIOGRAM (TEE);  Surgeon: Purcell Nailswen, Clarence H, MD;  [facility];  Service: Open Heart Surgery;  Laterality: N/A;  . TONSILLECTOMY     Family History:  Family History  Problem Relation Age of Onset  . Lymphoma Mother   . Peripheral vascular disease Father    Family Psychiatric  History: He does mention a couple of cousins who killed themselves.  Some anxiety and depression in the family. Social History:  Social History   Substance and Sexual Activity  Alcohol Use Yes   Comment: occ     Social History   Substance and Sexual Activity  Drug Use No    Social History   Socioeconomic History  . Marital status: Widowed    Spouse name: Not on file  . Number of children: Not on file  . Years of education: Not on file  . Highest education level: Not on file  Occupational History  . Not on file  Tobacco Use  . Smoking status: Former Smoker    Packs/day: 1.00    Years: 40.00  Pack years: 40.00    Types: Cigarettes    Quit date: 02/02/2018    Years since quitting: 3.0  . Smokeless tobacco: Never Used  . Tobacco comment: Quit 01/2018 - on 05/01/2018 talked to Edie about Relaspe concerns. He ststaes that he has no taste for tobacco since he quit in Feb.  Vaping Use  . Vaping Use: Never used  Substance and Sexual Activity  . Alcohol use: Yes    Comment: occ  . Drug use: No  . Sexual activity: Yes  Other Topics Concern  . Not on file  Social History Narrative   Lives in [place] by himself.  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  Fairly active but doesn't routinely exercise.   Social Determinants of Health   Financial Resource Strain: Not on file  Food Insecurity: Not on file  Transportation Needs: Not on file  Physical Activity: Not on file  Stress: Not on file  Social Connections: Not on file   Additional Social History:    Allergies:   Allergies  Allergen Reactions  . Atorvastatin Other (See  Comments)    Leg aches and weakness     Labs:  Results for orders placed or performed during the hospital encounter of 02/04/21 (from the past 48 hour(s))  Glucose, capillary     Status: Abnormal   Collection Time: 02/05/21  4:51 PM  Result Value Ref Range   Glucose-Capillary 132 (H) 70 - 99 mg/dL    Comment: Glucose reference range applies only to samples taken after fasting for at least 8 hours.  Glucose, capillary     Status: Abnormal   Collection Time: 02/05/21  8:27 PM  Result Value Ref Range   Glucose-Capillary 111 (H) 70 - 99 mg/dL    Comment: Glucose reference range applies only to samples taken after fasting for at least 8 hours.  CBC     Status: None   Collection Time: 02/06/21  5:31 AM  Result Value Ref Range   WBC 6.5 4.0 - 10.5 K/uL   RBC 4.63 4.22 - 5.81 MIL/uL   Hemoglobin 14.3 13.0 - 17.0 g/dL   HCT 16.1 09.6 - 04.5 %   MCV 91.1 80.0 - 100.0 fL   MCH 30.9 26.0 - 34.0 pg   MCHC 33.9 30.0 - 36.0 g/dL   RDW 40.9 81.1 - 91.4 %   Platelets 217 150 - 400 K/uL   nRBC 0.0 0.0 - 0.2 %    Comment: Performed at [facility], [address], [place], [place] [address]  Basic metabolic panel     Status: Abnormal   Collection Time: 02/06/21  5:31 AM  Result Value Ref Range   Sodium 135 135 - 145 mmol/L   Potassium 4.6 3.5 - 5.1 mmol/L   Chloride 101 98 - 111 mmol/L   CO2 27 22 - 32 mmol/L   Glucose, Bld 257 (H) 70 - 99 mg/dL    Comment: Glucose reference range applies only to samples taken after fasting for at least 8 hours.   BUN 38 (H) 8 - 23 mg/dL   Creatinine, Ser 6.21 0.61 - 1.24 mg/dL   Calcium 9.3 8.9 - 30.8 mg/dL   GFR, Estimated >65 >78 mL/min    Comment: (NOTE) Calculated using the CKD-EPI Creatinine Equation (2021)    Anion gap 7 5 - 15    Comment: Performed at [facility], [address] [place], [place] [address]  Glucose, capillary  Status: Abnormal   Collection Time: 02/06/21  8:00 AM  Result Value Ref Range    Glucose-Capillary 211 (H) 70 - 99 mg/dL    Comment: Glucose reference range applies only to samples taken after fasting for at least 8 hours.  Glucose, capillary     Status: Abnormal   Collection Time: 02/06/21 11:29 AM  Result Value Ref Range   Glucose-Capillary 254 (H) 70 - 99 mg/dL    Comment: Glucose reference range applies only to samples taken after fasting for at least 8 hours.  Blood gas, arterial     Status: Abnormal   Collection Time: 02/06/21  2:29 PM  Result Value Ref Range   FIO2 0.44    Delivery systems NASAL CANNULA    pH, Arterial 7.47 (H) 7.350 - 7.450   pCO2 arterial 36 32.0 - 48.0 mmHg   pO2, Arterial 61 (L) 83.0 - 108.0 mmHg   Bicarbonate 26.2 20.0 - 28.0 mmol/L   Acid-Base Excess 2.7 (H) 0.0 - 2.0 mmol/L   O2 Saturation 92.6 %   Patient temperature 37.0    Collection site LEFT RADIAL    Sample type ARTERIAL DRAW    Allens test (pass/fail) PASS PASS    Comment: Performed at [facility], [address] [place], [place] [address]  Glucose, capillary     Status: None   Collection Time: 02/06/21  4:44 PM  Result Value Ref Range   Glucose-Capillary 91 70 - 99 mg/dL    Comment: Glucose reference range applies only to samples taken after fasting for at least 8 hours.  Glucose, capillary     Status: Abnormal   Collection Time: 02/06/21  8:31 PM  Result Value Ref Range   Glucose-Capillary 121 (H) 70 - 99 mg/dL    Comment: Glucose reference range applies only to samples taken after fasting for at least 8 hours.  Glucose, capillary     Status: Abnormal   Collection Time: 02/07/21  7:38 AM  Result Value Ref Range   Glucose-Capillary 230 (H) 70 - 99 mg/dL    Comment: Glucose reference range applies only to samples taken after fasting for at least 8 hours.  Basic metabolic panel     Status: Abnormal   Collection Time: 02/07/21  7:59 AM  Result Value Ref Range   Sodium 135 135 - 145 mmol/L   Potassium 4.4 3.5 - 5.1 mmol/L   Chloride 98 98 - 111 mmol/L    CO2 29 22 - 32 mmol/L   Glucose, Bld 197 (H) 70 - 99 mg/dL    Comment: Glucose reference range applies only to samples taken after fasting for at least 8 hours.   BUN 26 (H) 8 - 23 mg/dL   Creatinine, Ser 9.62 0.61 - 1.24 mg/dL   Calcium 9.5 8.9 - 22.9 mg/dL   GFR, Estimated >79 >89 mL/min    Comment: (NOTE) Calculated using the CKD-EPI Creatinine Equation (2021)    Anion gap 8 5 - 15    Comment: Performed at [facility], [address] [place], [place] [address]  Hemoglobin     Status: None   Collection Time: 02/07/21  7:59 AM  Result Value Ref Range   Hemoglobin 13.5 13.0 - 17.0 g/dL    Comment: Performed at [facility], [address] [place], [place] [address]  Troponin I (High Sensitivity)     Status: Abnormal   Collection Time: 02/07/21  9:06 AM  Result Value Ref Range   Troponin I (  High Sensitivity) 79 (H) <18 ng/L    Comment: (NOTE) Elevated high sensitivity troponin I (hsTnI) values and significant  changes across serial measurements may suggest ACS but many other  chronic and acute conditions are known to elevate hsTnI results.  Refer to the "Links" section for chest pain algorithms and additional  guidance. Performed at [facility], [address] [place], [place] [address]   Troponin I (High Sensitivity)     Status: Abnormal   Collection Time: 02/07/21 10:59 AM  Result Value Ref Range   Troponin I (High Sensitivity) 80 (H) <18 ng/L    Comment: (NOTE) Elevated high sensitivity troponin I (hsTnI) values and significant  changes across serial measurements may suggest ACS but many other  chronic and acute conditions are known to elevate hsTnI results.  Refer to the "Links" section for chest pain algorithms and additional  guidance. Performed at [facility], [address] [place], [place] [address]   Glucose, capillary     Status: Abnormal   Collection Time: 02/07/21 11:37 AM  Result Value Ref Range    Glucose-Capillary 180 (H) 70 - 99 mg/dL    Comment: Glucose reference range applies only to samples taken after fasting for at least 8 hours.    Current Facility-Administered Medications  Medication Dose Route Frequency Provider Last Rate Last Admin  . acetaminophen (TYLENOL) tablet 650 mg  650 mg Oral Q6H PRN Andris Baumann, MD   650 mg at 02/06/21 4782   Or  . acetaminophen (TYLENOL) suppository 650 mg  650 mg Rectal Q6H PRN Andris Baumann, MD      . albuterol (PROVENTIL) (2.5 MG/3ML) 0.083% nebulizer solution 2.5 mg  2.5 mg Nebulization TID PRN Salena Saner, MD   2.5 mg at 02/07/21 0040  . ALPRAZolam Prudy Feeler) tablet 0.5 mg  0.5 mg Oral BID Alford Highland, MD   0.5 mg at 02/07/21 9562  . amLODipine (NORVASC) tablet 5 mg  5 mg Oral Daily Alford Highland, MD   5 mg at 02/07/21 1308  . arformoterol (BROVANA) nebulizer solution 15 mcg  15 mcg Nebulization BID Salena Saner, MD   15 mcg at 02/07/21 0817  . aspirin EC tablet 81 mg  81 mg Oral Daily Alford Highland, MD   81 mg at 02/07/21 0836  . budesonide (PULMICORT) nebulizer solution 0.5 mg  0.5 mg Nebulization BID Salena Saner, MD   0.5 mg at 02/07/21 0817  . busPIRone (BUSPAR) tablet 10 mg  10 mg Oral BID Libbey Duce T, MD      . cholecalciferol (VITAMIN D3) tablet 1,000 Units  1,000 Units Oral Ellwood Dense, Richard, MD   1,000 Units at 02/07/21 [phone]  . enoxaparin (LOVENOX) injection 40 mg  40 mg Subcutaneous Q24H Andris Baumann, MD   40 mg at 02/07/21 0950  . ezetimibe (ZETIA) tablet 10 mg  10 mg Oral QHS Alford Highland, MD   10 mg at 02/06/21 2100  . folic acid (FOLVITE) tablet 0.5 mg  500 mcg Oral QPM Wieting, Richard, MD   0.5 mg at 02/06/21 1727  . furosemide (LASIX) tablet 20 mg  20 mg Oral Daily Alford Highland, MD   20 mg at 02/07/21 0900  . glipiZIDE (GLUCOTROL XL) 24 hr tablet 10 mg  10 mg Oral Q breakfast Alford Highland, MD   10 mg at 02/07/21 4696  . guaiFENesin-dextromethorphan (ROBITUSSIN DM)  100-10 MG/5ML syrup 5 mL  5 mL Oral Q4H PRN Alford Highland, MD  5 mL at 02/06/21 2334  . insulin aspart (novoLOG) injection 0-15 Units  0-15 Units Subcutaneous TID WC Andris Baumann, MD   3 Units at 02/07/21 1155  . insulin aspart (novoLOG) injection 0-5 Units  0-5 Units Subcutaneous QHS Lindajo Royal V, MD      . lidocaine (LIDODERM) 5 % 1 patch  1 patch Transdermal Q24H Alford Highland, MD   1 patch at 02/06/21 2057  . losartan (COZAAR) tablet 100 mg  100 mg Oral Daily Alford Highland, MD   100 mg at 02/07/21 0836  . melatonin tablet 10 mg  10 mg Oral QHS Alford Highland, MD   10 mg at 02/06/21 2059  . metoprolol succinate (TOPROL-XL) 24 hr tablet 100 mg  100 mg Oral Daily Alford Highland, MD   100 mg at 02/07/21 0950  . ondansetron (ZOFRAN) tablet 4 mg  4 mg Oral Q6H PRN Andris Baumann, MD       Or  . ondansetron [facility]) injection 4 mg  4 mg Intravenous Q6H PRN Andris Baumann, MD      . pantoprazole (PROTONIX) EC tablet 40 mg  40 mg Oral Daily Alford Highland, MD   40 mg at 02/07/21 0900  . revefenacin (YUPELRI) nebulizer solution 175 mcg  175 mcg Nebulization Daily Salena Saner, MD   175 mcg at 02/07/21 0817  . sucralfate (CARAFATE) 1 GM/10ML suspension 1 g  1 g Oral TID WC & HS Alford Highland, MD   1 g at 02/07/21 1155  . traZODone (DESYREL) tablet 50 mg  50 mg Oral QHS Alford Highland, MD   50 mg at 02/06/21 2100    Musculoskeletal: Strength & Muscle Tone: within normal limits Gait & Station: unsteady Patient leans: N/A  Psychiatric Specialty Exam: Physical Exam Vitals and nursing note reviewed.  Constitutional:      Appearance: He is well-developed and well-nourished.  HENT:     Head: Normocephalic and atraumatic.  Eyes:     Conjunctiva/sclera: Conjunctivae normal.     Pupils: Pupils are equal, round, and reactive to light.  Cardiovascular:     Heart sounds: Normal heart sounds.  Pulmonary:     Effort: Pulmonary effort is normal.  Abdominal:      Palpations: Abdomen is soft.  Musculoskeletal:        General: Normal range of motion.     Cervical back: Normal range of motion.  Skin:    General: Skin is warm and dry.  Neurological:     General: No focal deficit present.     Mental Status: He is alert.  Psychiatric:        Attention and Perception: Attention normal.        Mood and Affect: Mood is anxious.        Speech: Speech normal.        Behavior: Behavior is cooperative.        Thought Content: Thought content normal.        Cognition and Memory: Cognition normal.        Judgment: Judgment normal.     Review of Systems  Constitutional: Negative.   HENT: Negative.   Eyes: Negative.   Respiratory: Negative.   Cardiovascular: Negative.   Gastrointestinal: Negative.   Musculoskeletal: Negative.   Skin: Negative.   Neurological: Negative.   Psychiatric/Behavioral: Negative for suicidal ideas. The patient is nervous/anxious.     Blood pressure 122/61, pulse 86, temperature (!) 97.5 F (36.4 C), temperature source Oral, resp. rate  20, height 5\' 6"  (1.676 m), weight 77.6 kg, SpO2 99 %.Body mass index is 27.62 kg/m.  General Appearance: Casual  Eye Contact:  Good  Speech:  Clear and Coherent  Volume:  Normal  Mood:  Euthymic  Affect:  Congruent  Thought Process:  Goal Directed  Orientation:  Full (Time, Place, and Person)  Thought Content:  Logical  Suicidal Thoughts:  No  Homicidal Thoughts:  No  Memory:  Immediate;   Fair Recent;   Fair Remote;   Fair  Judgement:  Fair  Insight:  Fair  Psychomotor Activity:  Normal  Concentration:  Concentration: Fair  Recall:  of Knowledge:  Fair  Language:  Fair  Akathisia:  No  Handed:  Right  AIMS (if indicated):     Assets:  Desire for Improvement Housing Resilience Social Support  ADL's:  Intact  Cognition:  WNL  Sleep:        Treatment Plan Summary: Medication management and Plan 75 year old man who is having anxiety in the form of panic-like  symptoms often triggered by physical symptoms.  He is lucid and calm and although he is having some trouble sleeping does not appear to have major depression.  No evidence of suicidal ideation.  Did not perform cognitive testing but overall appears to be in pretty good shape mentally did not appear to obviously be confused or demented.  Supportive counseling and education provided.  As far as medication goes I suggested that either switching to a serotonin reuptake inhibitor, which is really probably more indicated for panic attacks, or trying to increase the dose of the buspirone would be reasonable.  The recommended dose of buspirone in treating anxiety is 10 to 15 mg 3 times a day which obviously is quite a bit more than what he was started on.  I am going to increase him to 10 mg twice a day but his outpatient provider should continue to push that up as tolerated.  I also recommended to him seeing a psychotherapist.  I will try and provide some information about therapist.  From what I can tell the therapist who take Medicare in the area are pretty few but I will put a note on once I have a phone number.  Disposition: No evidence of imminent risk to self or others at present.   Patient does not meet criteria for psychiatric inpatient admission. Supportive therapy provided about ongoing stressors. Discussed crisis plan, support from social network, calling 911, coming to the Emergency Department, and calling Suicide Hotline.  71, MD 02/07/2021 4:09 PM

## 2021-02-07 NOTE — Care Management Important Message (Signed)
Important Message  Patient Details  Name: Harold Parker MRN: [id number] Date of Birth: 1945-12-26   Medicare Important Message Given:  Yes     Johnell Comings 02/07/2021, 10:54 AM

## 2021-02-07 NOTE — Progress Notes (Signed)
Progress Note  Patient Name: Harold Parker Date of Encounter: 02/07/2021  [facility] Cardiologist: [name], MD   Subjective   Had some left-sided chest and left arm pain last evening.  On-call MD was notified though was told the incorrect patient.  Symptoms did not feel like his prior angina.  No further pain though does note some "tingling" in this area.  Repeat high-sensitivity troponin this morning unchanged at 79 with delta troponin pending.  Repeat EKG this morning pending.  Inpatient Medications    Scheduled Meds: . ALPRAZolam  0.5 mg Oral BID  . amLODipine  5 mg Oral Daily  . arformoterol  15 mcg Nebulization BID  . aspirin EC  81 mg Oral Daily  . budesonide (PULMICORT) nebulizer solution  0.5 mg Nebulization BID  . busPIRone  5 mg Oral BID  . cholecalciferol  1,000 Units Oral BH-q7a  . enoxaparin (LOVENOX) injection  40 mg Subcutaneous Q24H  . ezetimibe  10 mg Oral QHS  . folic acid  500 mcg Oral QPM  . furosemide  20 mg Oral Daily  . glipiZIDE  10 mg Oral Q breakfast  . insulin aspart  0-15 Units Subcutaneous TID WC  . insulin aspart  0-5 Units Subcutaneous QHS  . lidocaine  1 patch Transdermal Q24H  . losartan  100 mg Oral Daily  . melatonin  10 mg Oral QHS  . metoprolol succinate  100 mg Oral Daily  . pantoprazole  40 mg Oral Daily  . revefenacin  175 mcg Nebulization Daily  . sucralfate  1 g Oral TID WC & HS  . traZODone  50 mg Oral QHS   Continuous Infusions:  PRN Meds: acetaminophen **OR** acetaminophen, albuterol, guaiFENesin-dextromethorphan, ondansetron **OR** ondansetron (ZOFRAN) IV   Vital Signs    Vitals:   02/06/21 2333 02/07/21 0432 02/07/21 0817 02/07/21 0857  BP: (!) 124/55 (!) 150/78  (!) 133/58  Pulse: 99 97  87  Resp: (!) 21 19  20   Temp: 100.1 F (37.8 C) 97.7 F (36.5 C)  98.4 F (36.9 C)  TempSrc: Oral Oral  Oral  SpO2: 90% 92% 90% 93%  Weight:  77.6 kg    Height:        Intake/Output Summary (Last 24 hours) at  02/07/2021 1126 Last data filed at 02/07/2021 0945 Gross per 24 hour  Intake 915 ml  Output 1075 ml  Net -160 ml   Last 3 Weights 02/07/2021 02/06/2021 02/05/2021  Weight (lbs) 171 lb 1.6 oz 179 lb 0.2 oz 174 lb 9.6 oz  Weight (kg) 77.61 kg 81.2 kg 79.198 kg      Telemetry    Sinus rhythm PVCs - Personally Reviewed  ECG    Pending - Personally Reviewed  Physical Exam   GEN: No acute distress.   Neck: No JVD Cardiac: RRR, no murmurs, rubs, or gallops.  Respiratory:  Bilateral rhonchi persist.  No crackles. Breathing treatment noted.  GI: Soft, nontender, non-distended  MS: No edema; No deformity. Neuro:  Nonfocal  Psych: Normal affect   Labs    High Sensitivity Troponin:   Recent Labs  Lab 01/11/21 1335 01/11/21 1415 02/04/21 0406 02/04/21 0935 02/07/21 0906  TROPONINIHS 67* 30* 24* 79* 79*      Chemistry Recent Labs  Lab 02/04/21 0406 02/05/21 0450 02/06/21 0531 02/07/21 0759  NA 131* 132* 135 135  K 3.8 4.5 4.6 4.4  CL 99 100 101 98  CO2 22 24 27 29   GLUCOSE 223* 343* 257*  197*  BUN 16 38* 38* 26*  CREATININE 0.97 1.32* 1.17 1.05  CALCIUM 8.2* 9.2 9.3 9.5  PROT 6.5  --   --   --   ALBUMIN 2.8*  --   --   --   AST 22  --   --   --   ALT 34  --   --   --   ALKPHOS 66  --   --   --   BILITOT 1.2  --   --   --   GFRNONAA >60 56* >60 >60  ANIONGAP 10 8 7 8      Hematology Recent Labs  Lab 02/04/21 0406 02/05/21 0450 02/06/21 0531 02/07/21 0759  WBC 9.0 9.7 6.5  --   RBC 4.30 3.87* 4.63  --   HGB 13.2 11.9* 14.3 13.5  HCT 38.5* 34.2* 42.2  --   MCV 89.5 88.4 91.1  --   MCH 30.7 30.7 30.9  --   MCHC 34.3 34.8 33.9  --   RDW 13.7 13.8 14.2  --   PLT 148* 191 217  --     BNP Recent Labs  Lab 02/04/21 0406  BNP 293.2*     DDimer No results for input(s): DDIMER in the last 168 hours.   Radiology    No new studies.  Cardiac Studies   2D echo 02/05/2021: 1. Left ventricular ejection fraction, by estimation, is 40 to 45%. The  left  ventricle has mildly decreased function. Left ventricular endocardial  border not optimally defined to evaluate regional wall motion. Left  ventricular diastolic parameters are  consistent with Grade I diastolic dysfunction (impaired relaxation).  2. Right ventricular systolic function is normal. The right ventricular  size is normal. There is moderately elevated pulmonary artery systolic  pressure. The estimated right ventricular systolic pressure is 58.4 mmHg.  3. Left atrial size was mildly dilated.  4. The mitral valve is normal in structure. Mild mitral valve  regurgitation. No evidence of mitral stenosis.  5. Tricuspid valve regurgitation is moderate.  6. The aortic valve was not well visualized. Aortic valve regurgitation  is not visualized. No aortic stenosis is present.  7. The inferior vena cava is normal in size with greater than 50%  respiratory variability, suggesting right atrial pressure of 3 mmHg.  Patient Profile     75 y.o. 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 fib 02/2018 , GIB/duodenal ulcers while on DAPT, PAD s/p 02/2019 angiography and intervention to LSFA, hypertension, hyperlipidemia, DM2, COPD, previous tobacco abuse (quit 3 years ago), and who is being followed for elevated troponin in the setting of respiratory failure due to recent COVID-19 pneumonia.    Assessment & Plan    1.  Chronic systolic heart failure: Mildly reduced LV systolic function.  Echo during this admission showed only slight decrease in EF from before, 40 to 45%.  In addition, there was evidence of moderate pulmonary hypertension.  It has been suspected his pulmonary hypertension is a mixed etiology due to left-sided heart failure as well as underlying lung disease.  With worsening renal function his furosemide was briefly held with though this was resumed at 40 mg once daily on 2/21.  Continue Toprol-XL and losartan.  Escalate  GDMT in follow-up as able.  2.  Coronary artery disease status post CABG:  Mildly elevated troponin has been felt to be likely due to supply demand ischemia.  No evidence of  acute coronary syndrome.  He did have recurrent chest discomfort overnight though internal medicine was told the wrong patient.  Symptoms lasted for approximately 2 hours and did not feel like his prior angina.  Currently chest pain-free though does note a "tingling" in the area of the previously reported chest discomfort.  Initial high-sensitivity troponin this morning is unchanged at 79 with delta pending.  Repeat EKG pending.  If these are unrevealing, no plans for further ischemic cardiac evaluation.  He remains on aspirin as well as amlodipine and metoprolol for antianginal effect.  He remains on Zetia with intolerance to atorvastatin noted.  Most recent LDL of 45 from 12/2020.  3.  Acute on chronic hypoxic respiratory failure due to recent COVID-19 pneumonia: Seems to be improving.  He is still requiring supplemental oxygen.  Appreciate pulmonary input.  Per primary service.  4.  Anxiety and insomnia: He prefers to be discharged on the morning of 2/23.  Defer to primary service.     For questions or updates, please contact [facility] Please consult [email or web address] for contact info under        Signed, Eula Listen, PA-C  02/07/2021, 11:26 AM

## 2021-02-07 NOTE — Progress Notes (Signed)
Patient ID: Harold Parker, male   DOB: May 19, 1946, 75 y.o.   MRN: [id number] [facility] PROGRESS NOTE  Deejay Koppelman LXB:[id number] DOB: 1946-03-27 DOA: 02/04/2021 PCP: Merlene Laughter, MD  HPI/Subjective: Patient developed chest pain last night in anticipation of me sending him home today.  Unfortunately the nurse notified me on a different patient.  Patient feeling better today but requested to see psychiatry.  Objective: Vitals:   02/07/21 1210 02/07/21 1622  BP: 122/61 (!) 104/50  Pulse: 86 85  Resp: 20 (!) 24  Temp: (!) 97.5 F (36.4 C) 98.6 F (37 C)  SpO2: 99% 95%    Intake/Output Summary (Last 24 hours) at 02/07/2021 1652 Last data filed at 02/07/2021 1458 Gross per 24 hour  Intake 720 ml  Output 1950 ml  Net -1230 ml   Filed Weights   02/05/21 0402 02/06/21 0349 02/07/21 0432  Weight: 79.2 kg 81.2 kg 77.6 kg    ROS: Review of Systems  Respiratory: Positive for shortness of breath. Negative for cough.   Cardiovascular: Negative for chest pain.  Gastrointestinal: Negative for abdominal pain, nausea and vomiting.   Exam: Physical Exam HENT:     Head: Normocephalic.     Mouth/Throat:     Pharynx: No oropharyngeal exudate.  Eyes:     General: Lids are normal.     Conjunctiva/sclera: Conjunctivae normal.     Pupils: Pupils are equal, round, and reactive to light.  Cardiovascular:     Rate and Rhythm: Normal rate and regular rhythm.     Heart sounds: Normal heart sounds, S1 normal and S2 normal.  Pulmonary:     Breath sounds: Examination of the right-lower field reveals decreased breath sounds. Examination of the left-lower field reveals decreased breath sounds. Decreased breath sounds present. No wheezing, rhonchi or rales.  Abdominal:     Palpations: Abdomen is soft.     Tenderness: There is no abdominal tenderness.  Musculoskeletal:     Right lower leg: No swelling.     Left lower leg: No swelling.  Skin:    General: Skin is warm.     Findings: No  rash.  Neurological:     Mental Status: He is alert and oriented to person, place, and time.       Data Reviewed: Basic Metabolic Panel: Recent Labs  Lab 02/04/21 0406 02/04/21 1305 02/05/21 0450 02/06/21 0531 02/07/21 0759  NA 131*  --  132* 135 135  K 3.8  --  4.5 4.6 4.4  CL 99  --  100 101 98  CO2 22  --  24 27 29   GLUCOSE 223*  --  343* 257* 197*  BUN 16  --  38* 38* 26*  CREATININE 0.97  --  1.32* 1.17 1.05  CALCIUM 8.2*  --  9.2 9.3 9.5  MG  --  2.0 2.3  --   --   PHOS  --   --  4.0  --   --    Liver Function Tests: Recent Labs  Lab 02/04/21 0406  AST 22  ALT 34  ALKPHOS 66  BILITOT 1.2  PROT 6.5  ALBUMIN 2.8*   CBC: Recent Labs  Lab 02/04/21 0406 02/05/21 0450 02/06/21 0531 02/07/21 0759  WBC 9.0 9.7 6.5  --   NEUTROABS 6.1  --   --   --   HGB 13.2 11.9* 14.3 13.5  HCT 38.5* 34.2* 42.2  --   MCV 89.5 88.4 91.1  --   PLT 148* 191 217  --  BNP (last 3 results) Recent Labs    01/11/21 0538 01/13/21 0434 02/04/21 0406  BNP 181.6* 591.1* 293.2*    CBG: Recent Labs  Lab 02/06/21 1129 02/06/21 1644 02/06/21 2031 02/07/21 0738 02/07/21 1137  GLUCAP 254* 91 121* 230* 180*    Scheduled Meds: . ALPRAZolam  0.5 mg Oral BID  . amLODipine  5 mg Oral Daily  . arformoterol  15 mcg Nebulization BID  . aspirin EC  81 mg Oral Daily  . budesonide (PULMICORT) nebulizer solution  0.5 mg Nebulization BID  . busPIRone  10 mg Oral BID  . cholecalciferol  1,000 Units Oral BH-q7a  . enoxaparin (LOVENOX) injection  40 mg Subcutaneous Q24H  . ezetimibe  10 mg Oral QHS  . folic acid  500 mcg Oral QPM  . furosemide  20 mg Oral Daily  . glipiZIDE  10 mg Oral Q breakfast  . insulin aspart  0-15 Units Subcutaneous TID WC  . insulin aspart  0-5 Units Subcutaneous QHS  . lidocaine  1 patch Transdermal Q24H  . losartan  100 mg Oral Daily  . melatonin  10 mg Oral QHS  . metoprolol succinate  100 mg Oral Daily  . pantoprazole  40 mg Oral Daily  .  revefenacin  175 mcg Nebulization Daily  . sucralfate  1 g Oral TID WC & HS  . traZODone  50 mg Oral QHS   Brief history.  Patient has end-stage COPD and was on 2 L of oxygen prior to admission with Covid (1/26) where he was sent home on 4 L of oxygen.  The patient was readmitted on 02/05/2020 with shortness of breath.  Patient developed chest pain last night and the nursing staff notified me on a another patient rather than this patient.  Cardiology ordered enzymes this morning which were flat from where they were previously.  Patient requested a psychiatry consultation. Assessment/Plan:  1. Acute on chronic hypoxic respiratory failure.  Patient had a recent COVID pneumonia on 01/11/2021.  He was sent home on 4 L of oxygen.  Initially pulse ox 87% on 4 L.  Initially was on 6 L of oxygen and asked the nursing staff to bring him down to 4 L. 2. COPD exacerbation recent Covid infection.  I did not start steroids because sugar was 500 right before I was going to start steroids.  Pulmonary Dr. Jayme Cloud saw the patient and started nebulizer treatments. 3. Insomnia and anxiety.  I started trazodone at night so he can sleep.  Patient was seen in consultation by psychiatry Dr. Toni Amend and he increased the BuSpar dose. 4. Type 2 diabetes mellitus with hyperglycemia.  The patient takes glipizide twice a day at home but I did increase it to 10 mg once a day extended release.  Initially I started Lantus when the sugar was up at 500 but I discontinued that since his A1c is 7.5. 5. Pulmonary nodule seen on CT scan.  Dr. Jayme Cloud pulmonary thinks this is likely related to COVID and will get a repeat CT scan as outpatient 6. Acute kidney injury with a creatinine of 0.97 on admission went up to 1.32.  Held Lasix for a day and creatinine improved down to 1.05. 7. 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  Continue losartan metoprolol and Lasix. 8. Chest pain last night.  Cardiologist ordered  troponins this morning which were flat from previous.  I advised seeing his PCP, cardiologist and pulmonologist on a regular basis and more frequent appointments.  Code Status:     Code Status Orders  (From admission, onward)         Start     Ordered   02/04/21 0531  Full code  Continuous        02/04/21 0532        Code Status History    Date Active Date Inactive Code Status Order ID Comments User Context   01/11/2021 1113 01/24/2021 2245 Full Code [id number]  Lorretta Harp, MD ED   03/04/2019 1040 03/04/2019 1733 Full Code [id number]  Iran Ouch, MD Inpatient   03/14/2018 0135 03/18/2018 1900 Full Code [id number]  Cammy Copa, MD ED   02/07/2018 1636 02/26/2018 2129 Full Code [id number]  Creig Hines, NP Inpatient   02/05/2018 2047 02/07/2018 1608 Full Code [id number]  Shaune Pollack, MD Inpatient   Advance Care Planning Activity    Advance Directive Documentation   Flowsheet Row Most Recent Value  Type of Advance Directive Healthcare Power of Attorney  Pre-existing out of facility DNR order (yellow form or pink MOST form) --  "MOST" Form in Place? --     Family Communication: Spoke with [name] on the phone Disposition Plan: Status is: Inpatient  Dispo: The patient is from: Home              Anticipated d/c is to: Home              Anticipated d/c date is: 02/08/2021              Patient currently medically stable   Difficult to place patient.  No  Time spent: 28 minutes  Francess Mullen [employer]

## 2021-02-07 NOTE — Progress Notes (Signed)
Patient with high anxiety and anxious about going home tomorrow. Educated multiple times to slow breathing down and breathe in through his nose and out his mouth.  When this nurse rounds on patient, his oxygen is often out of his nose.  Patient getting breathing treatment and c/o pain going down L arm. Dr. Wieting made aware. Will obtain troponin and EKG 

## 2021-02-08 ENCOUNTER — Encounter: Payer: Self-pay | Admitting: *Deleted

## 2021-02-08 DIAGNOSIS — J449 Chronic obstructive pulmonary disease, unspecified: Secondary | ICD-10-CM

## 2021-02-08 LAB — GLUCOSE, CAPILLARY
Glucose-Capillary: 223 mg/dL — ABNORMAL HIGH (ref 70–99)
Glucose-Capillary: 306 mg/dL — ABNORMAL HIGH (ref 70–99)
Glucose-Capillary: 70 mg/dL (ref 70–99)
Glucose-Capillary: 83 mg/dL (ref 70–99)
Glucose-Capillary: 200 mg/dL — ABNORMAL HIGH (ref 70–99)
Glucose-Capillary: 170 mg/dL — ABNORMAL HIGH (ref 70–99)

## 2021-02-08 MED ORDER — FUROSEMIDE 10 MG/ML IJ SOLN
40.0000 mg | Freq: Once | INTRAMUSCULAR | Status: AC
  Administered 2021-02-08: 40 mg via INTRAVENOUS
  Filled 2021-02-08: qty 4

## 2021-02-08 NOTE — Progress Notes (Signed)
Mobility Specialist - Progress Note   02/08/21 1659  Mobility  Activity Refused mobility  Mobility performed by Mobility specialist    Pt declined mobility this date, no reason specified. Pt reports fatigue, states he's been up most of the day d/t bowels moving. Requests to return tomorrow. Will attempt session at next available date.    Filiberto Pinks Mobility Specialist 02/08/21, 5:01 PM

## 2021-02-08 NOTE — Progress Notes (Signed)
PT Cancellation Note  Patient Details Name: Harold Parker MRN: [id number] DOB: 1946/06/17   Cancelled Treatment:     PT attempt. Third attempt today. Upon arriving to room, pt was sitting on toilet in BR with small BMs on floor. Pt visibly SOB. Refused PT at this time requesting therapist return tomorrow morning. RN aware of pt's abilities.   [name] 02/08/2021, 3:31 PM

## 2021-02-08 NOTE — Progress Notes (Signed)
PT Cancellation Note  Patient Details Name: Harold Parker MRN: [id number] DOB: 08/08/1946   Cancelled Treatment:     PT attempt. Pt refused at this time requesting pt return after lunch. Pt states he has been up 3 x already today due to having frequent BMs. Author will return this afternoon and continue to follow per POC.    [name] 02/08/2021, 11:48 AM

## 2021-02-08 NOTE — Progress Notes (Signed)
Pulmonary Individual Treatment Plan  Patient Details  Name: Harold Parker MRN: [id number] Date of Birth: May 13, 1946 Referring Provider:   Flowsheet Row Pulmonary Rehab from 09/13/2020 in [facility] Cardiac and Pulmonary Rehab  Referring Provider Jayme Cloud      Initial Encounter Date:  Flowsheet Row Pulmonary Rehab from 09/13/2020 in [facility] Cardiac and Pulmonary Rehab  Date 09/13/20      Visit Diagnosis: Chronic obstructive pulmonary disease, unspecified COPD type (HCC)  Patient's Home Medications on Admission: No current facility-administered medications for this visit. No current outpatient medications on file.  Facility-Administered Medications Ordered in Other Visits:  .  acetaminophen (TYLENOL) tablet 650 mg, 650 mg, Oral, Q6H PRN, 650 mg at 02/06/21 0749 **OR** acetaminophen (TYLENOL) suppository 650 mg, 650 mg, Rectal, Q6H PRN, Lindajo Royal V, MD .  albuterol (PROVENTIL) (2.5 MG/3ML) 0.083% nebulizer solution 2.5 mg, 2.5 mg, Nebulization, TID PRN, Salena Saner, MD, 2.5 mg at 02/07/21 0040 .  ALPRAZolam Prudy Feeler) tablet 0.5 mg, 0.5 mg, Oral, BID, Renae Gloss, Richard, MD, 0.5 mg at 02/07/21 2239 .  amLODipine (NORVASC) tablet 5 mg, 5 mg, Oral, Daily, Renae Gloss, Richard, MD, 5 mg at 02/07/21 0837 .  arformoterol (BROVANA) nebulizer solution 15 mcg, 15 mcg, Nebulization, BID, Salena Saner, MD, 15 mcg at 02/07/21 2009 .  aspirin EC tablet 81 mg, 81 mg, Oral, Daily, Alford Highland, MD, 81 mg at 02/07/21 0836 .  budesonide (PULMICORT) nebulizer solution 0.5 mg, 0.5 mg, Nebulization, BID, Salena Saner, MD, 0.5 mg at 02/07/21 2009 .  busPIRone (BUSPAR) tablet 10 mg, 10 mg, Oral, BID, Clapacs, John T, MD, 10 mg at 02/07/21 2239 .  cholecalciferol (VITAMIN D3) tablet 1,000 Units, 1,000 Units, Oral, Lesle Reek, Alford Highland, MD, 1,000 Units at 02/07/21 [phone] .  enoxaparin (LOVENOX) injection 40 mg, 40 mg, Subcutaneous, Q24H, Andris Baumann, MD, 40 mg at 02/07/21 0950 .  ezetimibe  (ZETIA) tablet 10 mg, 10 mg, Oral, QHS, Wieting, Richard, MD, 10 mg at 02/07/21 2239 .  folic acid (FOLVITE) tablet 0.5 mg, 500 mcg, Oral, QPM, Wieting, Richard, MD, 0.5 mg at 02/07/21 1704 .  furosemide (LASIX) tablet 20 mg, 20 mg, Oral, Daily, Wieting, Richard, MD, 20 mg at 02/07/21 0900 .  glipiZIDE (GLUCOTROL XL) 24 hr tablet 10 mg, 10 mg, Oral, Q breakfast, Renae Gloss, Richard, MD, 10 mg at 02/07/21 1191 .  guaiFENesin-dextromethorphan (ROBITUSSIN DM) 100-10 MG/5ML syrup 5 mL, 5 mL, Oral, Q4H PRN, Alford Highland, MD, 5 mL at 02/08/21 0647 .  insulin aspart (novoLOG) injection 0-15 Units, 0-15 Units, Subcutaneous, TID WC, Andris Baumann, MD, 8 Units at 02/07/21 1704 .  insulin aspart (novoLOG) injection 0-5 Units, 0-5 Units, Subcutaneous, QHS, Para March, Hazel V, MD .  lidocaine (LIDODERM) 5 % 1 patch, 1 patch, Transdermal, Q24H, Alford Highland, MD, 1 patch at 02/07/21 1805 .  losartan (COZAAR) tablet 100 mg, 100 mg, Oral, Daily, Renae Gloss, Richard, MD, 100 mg at 02/07/21 0836 .  melatonin tablet 10 mg, 10 mg, Oral, QHS, Wieting, Richard, MD, 10 mg at 02/07/21 2239 .  metoprolol succinate (TOPROL-XL) 24 hr tablet 100 mg, 100 mg, Oral, Daily, Renae Gloss, Richard, MD, 100 mg at 02/07/21 0950 .  ondansetron (ZOFRAN) tablet 4 mg, 4 mg, Oral, Q6H PRN **OR** ondansetron (ZOFRAN) injection 4 mg, 4 mg, Intravenous, Q6H PRN, Andris Baumann, MD .  pantoprazole (PROTONIX) EC tablet 40 mg, 40 mg, Oral, Daily, Wieting, Richard, MD, 40 mg at 02/07/21 0900 .  revefenacin (YUPELRI) nebulizer solution 175 mcg, 175 mcg, Nebulization, Daily,  Salena Saner, MD, 175 mcg at 02/07/21 [phone]  sucralfate (CARAFATE) 1 GM/10ML suspension 1 g, 1 g, Oral, TID WC & HS, Wieting, Richard, MD, 1 g at 02/07/21 2240 .  traZODone (DESYREL) tablet 50 mg, 50 mg, Oral, QHS, Alford Highland, MD, 50 mg at 02/07/21 2239  Past Medical History: Past Medical History:  Diagnosis Date  . Bilateral carotid bruits    a. 01/2018 U/S: < 50%  bilat ICA stenosis.  Marland Kitchen CAD (coronary artery disease)    a. 1998 s/p MI and BMS [facility], [place]); b. 1999 redo PCI/rotablator in setting of what sounds like ISR;  c. Multiple stress tests over the years - last ~ 2017, reportedly nl; d. 01/2018 NSTEMI/Cath: LM 35m/d, LAD 50p, 40p/m, D1 60ost, OM1 95, RCA 100ost/p w/ L->R collats, EF 45%; e. s/p 3V CABG 02/19/18 (LIMA-LAD, VG-D1, VG-OM)  . Chronic lower back pain   . COPD (chronic obstructive pulmonary disease) (HCC)   . GIB (gastrointestinal bleeding)    a. 02/2018 GIB and anemia w/ Hgb of 4.7 on presentation; b. 03/2018 EGD: 2 nonbleeding duodenal ulcers.  Marland Kitchen HTN (hypertension)   . Hypercholesteremia   . Ischemic cardiomyopathy    a. 01/2018 Echo: EF 40-45%, mid-apicalanteroseptal, ant, apical sev HK, mod apicalinferior HK. Gr2 DD. Mod AS, mild MR, mod dil LA, PASP .  . Moderate aortic stenosis    a. 01/2018 Echo: Mod AS, mean grad (S) , Valve area (VTI) 1.06 cm^2, (Vmax) 1.27 cm^2; b. 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  . Myocardial infarction (HCC) ~ 1998/1999  . 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 02/19/2018 AVR: 25 mm Edwards Inspiris Resilia stented bovine pericardial tissue valve  . 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 Tobacco abuse   . Type II diabetes mellitus (HCC)     Tobacco Use: Social History   Tobacco Use  Smoking Status Former Smoker  . Packs/day: 1.00  . Years: 40.00  . Pack years: 40.00  . Types: Cigarettes  . Quit date: 02/02/2018  . Years since quitting: 3.0  Smokeless Tobacco Never Used  Tobacco Comment   Quit 01/2018 - on 05/01/2018 talked to Seon about Relaspe concerns. He ststaes that he has no taste for tobacco since he quit in Feb.    Labs: Recent Review Flowsheet Data    Labs for ITP Cardiac and Pulmonary Rehab Latest Ref Rng & Units 02/20/2018 03/27/2018 01/11/2021 01/12/2021 02/06/2021   Cholestrol 0 - 200 mg/dL - 960 - 90  -   LDLCALC 0 - 99 mg/dL - 50 - 45 -   HDL >45 mg/dL - 40(J) - 81(X) -   Trlycerides <150 mg/dL - 84 55 87 -   Hemoglobin A1c 4.8 - 5.6 % - - - 7.5(H) -   PHART 7.350 - 7.450 7.331(L) - - - 7.47(H)   PCO2ART 32.0 - 48.0 mmHg 41.0 - - - 36   HCO3 20.0 - 28.0 mmol/L 21.5 - - - 26.2   TCO2 22 - 32 mmol/L 23 - - - -   ACIDBASEDEF 0.0 - 2.0 mmol/L 4.0(H) - - - -   O2SAT % 95.0 - - - 92.6       Pulmonary Assessment Scores:  Pulmonary Assessment Scores    Row Name 09/13/20 1422         ADL UCSD   SOB Score total 21  Rest 0     Walk 2     Stairs 3     Bath 2     Dress 0     Shop 0           CAT Score   CAT Score 16           mMRC Score   mMRC Score 3            UCSD: Self-administered rating of dyspnea associated with activities of daily living (ADLs) 6-point scale (0 = "not at all" to 5 = "maximal or unable to do because of breathlessness")  Scoring Scores range from 0 to 120.  Minimally important difference is 5 units  CAT: CAT can identify the health impairment of COPD patients and is better correlated with disease progression.  CAT has a scoring range of zero to 40. The CAT score is classified into four groups of low (less than 10), medium (10 - 20), high (21-30) and very high (31-40) based on the impact level of disease on health status. A CAT score over 10 suggests significant symptoms.  A worsening CAT score could be explained by an exacerbation, poor medication adherence, poor inhaler technique, or progression of COPD or comorbid conditions.  CAT MCID is 2 points  mMRC: mMRC (Modified Medical Research Council) Dyspnea Scale is used to assess the degree of baseline functional disability in patients of respiratory disease due to dyspnea. No minimal important difference is established. A decrease in score of 1 point or greater is considered a positive change.   Pulmonary Function Assessment:   Exercise Target Goals: Exercise Program Goal: Individual  exercise prescription set using results from initial 6 min walk test and THRR while considering  patient's activity barriers and safety.   Exercise Prescription Goal: Initial exercise prescription builds to 30-45 minutes a day of aerobic activity, 2-3 days per week.  Home exercise guidelines will be given to patient during program as part of exercise prescription that the participant will acknowledge.  Education: Aerobic Exercise: - Group verbal and visual presentation on the components of exercise prescription. Introduces F.I.T.T principle from ACSM for exercise prescriptions.  Reviews F.I.T.T. principles of aerobic exercise including progression. Written material given at graduation. Flowsheet Row Cardiac Rehab from 09/02/2018 in [facility] Cardiac and Pulmonary Rehab  Date 07/24/18  Educator AS  Instruction Review Code 1- Verbalizes Understanding      Education: Resistance Exercise: - Group verbal and visual presentation on the components of exercise prescription. Introduces F.I.T.T principle from ACSM for exercise prescriptions  Reviews F.I.T.T. principles of resistance exercise including progression. Written material given at graduation.    Education: Exercise & Equipment Safety: - Individual verbal instruction and demonstration of equipment use and safety with use of the equipment. Flowsheet Row Pulmonary Rehab from 09/21/2020 in [facility] Cardiac and Pulmonary Rehab  Date 09/13/20  Educator AS  Instruction Review Code 1- Verbalizes Understanding      Education: Exercise Physiology & General Exercise Guidelines: - Group verbal and written instruction with models to review the exercise physiology of the cardiovascular system and associated critical values. Provides general exercise guidelines with specific guidelines to those with heart or lung disease.  Flowsheet Row Cardiac Rehab from 09/02/2018 in [facility] Cardiac and Pulmonary Rehab  Date 05/20/18  Educator AS  Instruction Review Code 4- No  Evidence of Learning  [charting in error]      Education: Flexibility, Balance, Mind/Body Relaxation: - Group verbal and visual presentation with interactive activity on  the components of exercise prescription. Introduces F.I.T.T principle from ACSM for exercise prescriptions. Reviews F.I.T.T. principles of flexibility and balance exercise training including progression. Also discusses the mind body connection.  Reviews various relaxation techniques to help reduce and manage stress (i.e. Deep breathing, progressive muscle relaxation, and visualization). Balance handout provided to take home. Written material given at graduation. Flowsheet Row Cardiac Rehab from 09/02/2018 in [facility] Cardiac and Pulmonary Rehab  Date 07/29/18  Educator AS  Instruction Review Code 1- Verbalizes Understanding      Activity Barriers & Risk Stratification:  Activity Barriers & Cardiac Risk Stratification - 09/12/20 1113      Activity Barriers & Cardiac Risk Stratification   Activity Barriers Shortness of Breath           6 Minute Walk:  6 Minute Walk    Row Name 09/13/20 1415         6 Minute Walk   Phase Initial     Distance 815 feet     Walk Time 5.5 minutes     # of Rest Breaks 3     MPH 1.7     METS 1.8     RPE 16     Perceived Dyspnea  3     VO2 Peak 6.2     Symptoms Yes (comment)     Comments SOB     Resting HR 78 bpm     Resting BP 140/74     Resting Oxygen Saturation  91 %     Exercise Oxygen Saturation  during 6 min walk 84 %     Max Ex. HR 104 bpm     Max Ex. BP 158/64     2 Minute Post BP 138/76           Interval HR   1 Minute HR 85     2 Minute HR 99     3 Minute HR 101     4 Minute HR 104     5 Minute HR 101     6 Minute HR 103     2 Minute Post HR 96     Interval Heart Rate? Yes           Interval Oxygen   Interval Oxygen? Yes     Baseline Oxygen Saturation % 91 %     1 Minute Oxygen Saturation % 85 %     1 Minute Liters of Oxygen 2 L     2 Minute Oxygen  Saturation % 84 %     2 Minute Liters of Oxygen 2 L     3 Minute Oxygen Saturation % 84 %     3 Minute Liters of Oxygen 2 L     4 Minute Oxygen Saturation % 85 %     4 Minute Liters of Oxygen 2 L     5 Minute Oxygen Saturation % 85 %     5 Minute Liters of Oxygen 2 L     6 Minute Oxygen Saturation % 86 %     6 Minute Liters of Oxygen 2 L     2 Minute Post Oxygen Saturation % 85 %     2 Minute Post Liters of Oxygen 2 L           Oxygen Initial Assessment:  Oxygen Initial Assessment - 09/12/20 1111      Home Oxygen   Home Oxygen Device Home Concentrator;Portable Concentrator    Sleep Oxygen Prescription Continuous  Liters per minute 2    Home Exercise Oxygen Prescription None    Home Resting Oxygen Prescription Continuous   as needed   Liters per minute 2      Intervention   Short Term Goals To learn and exhibit compliance with exercise, home and travel O2 prescription;To learn and understand importance of monitoring SPO2 with pulse oximeter and demonstrate accurate use of the pulse oximeter.;To learn and understand importance of maintaining oxygen saturations>88%;To learn and demonstrate proper pursed lip breathing techniques or other breathing techniques.;To learn and demonstrate proper use of respiratory medications    Long  Term Goals Exhibits compliance with exercise, home and travel O2 prescription;Verbalizes importance of monitoring SPO2 with pulse oximeter and return demonstration;Maintenance of O2 saturations>88%;Exhibits proper breathing techniques, such as pursed lip breathing or other method taught during program session;Compliance with respiratory medication;Demonstrates proper use of MDI's           Oxygen Re-Evaluation:  Oxygen Re-Evaluation    Row Name 09/19/20 1127 10/07/20 1113 11/02/20 1107 11/30/20 1118 12/21/20 1127     Program Oxygen Prescription   Program Oxygen Prescription -- Continuous;E-Tanks Continuous;E-Tanks Continuous;E-Tanks Continuous    Liters per minute -- 3 3 3 3      Home Oxygen   Home Oxygen Device Home Concentrator;Portable Concentrator Home Concentrator;Portable Concentrator Home Concentrator;Portable Concentrator Home Concentrator;Portable Concentrator Home Concentrator;Portable Concentrator;E-Tanks   Sleep Oxygen Prescription Continuous Continuous Continuous Continuous Continuous   Liters per minute 2 2 2 2 2    Home Exercise Oxygen Prescription None Continuous Continuous Continuous Continuous   Liters per minute -- 2 2 2 2    Home Resting Oxygen Prescription Continuous Continuous Continuous Continuous Continuous   Liters per minute 2 2 2 2 2   as Needed   Compliance with Home Oxygen Use -- Yes No  He will use it overnight and will use it when he feels he needs it. Yes Yes     Goals/Expected Outcomes   Short Term Goals To learn and exhibit compliance with exercise, home and travel O2 prescription;To learn and understand importance of monitoring SPO2 with pulse oximeter and demonstrate accurate use of the pulse oximeter.;To learn and understand importance of maintaining oxygen saturations>88%;To learn and demonstrate proper pursed lip breathing techniques or other breathing techniques.;To learn and demonstrate proper use of respiratory medications To learn and understand importance of maintaining oxygen saturations>88%;To learn and understand importance of monitoring SPO2 with pulse oximeter and demonstrate accurate use of the pulse oximeter. To learn and understand importance of maintaining oxygen saturations>88%;To learn and understand importance of monitoring SPO2 with pulse oximeter and demonstrate accurate use of the pulse oximeter. To learn and understand importance of maintaining oxygen saturations>88%;To learn and understand importance of monitoring SPO2 with pulse oximeter and demonstrate accurate use of the pulse oximeter.;To learn and exhibit compliance with exercise, home and travel O2 prescription;To learn and  demonstrate proper pursed lip breathing techniques or other breathing techniques.;To learn and demonstrate proper use of respiratory medications To learn and understand importance of monitoring SPO2 with pulse oximeter and demonstrate accurate use of the pulse oximeter.;To learn and understand importance of maintaining oxygen saturations>88%   Long  Term Goals Exhibits compliance with exercise, home and travel O2 prescription;Verbalizes importance of monitoring SPO2 with pulse oximeter and return demonstration;Maintenance of O2 saturations>88%;Exhibits proper breathing techniques, such as pursed lip breathing or other method taught during program session;Compliance with respiratory medication;Demonstrates proper use of MDI's Maintenance of O2 saturations>88%;Verbalizes importance of monitoring SPO2 with pulse oximeter and return demonstration  Maintenance of O2 saturations>88%;Verbalizes importance of monitoring SPO2 with pulse oximeter and return demonstration Exhibits compliance with exercise, home and travel O2 prescription;Verbalizes importance of monitoring SPO2 with pulse oximeter and return demonstration;Maintenance of O2 saturations>88%;Exhibits proper breathing techniques, such as pursed lip breathing or other method taught during program session;Compliance with respiratory medication;Demonstrates proper use of MDI's Maintenance of O2 saturations>88%;Verbalizes importance of monitoring SPO2 with pulse oximeter and return demonstration   Comments Reviewed PLB technique with pt.  Talked about how it works and it's importance in maintaining their exercise saturations. He has a pulse oximeter to check his oxygen saturation at home. Informed and explained why it is important to have one. Reviewed that oxygen saturations should be 88 percent and above. Patient has a pulse oximeter at home to check his oxygen. He reports cheking O2 2-3x/day and uses his nebulizer 2x/day. His O2 is usually 80-90, expressed  importance of keeping O2 over 88. Reviewed PLB. Harold Parker is staying compliant with his oxygen and his breathing is getting better.  He continues to monitor his saturations and doing well with his breathing and using his PLB. Harold Parker states he is going to use his oxygen when walking in to rehab. Informed him that his oxygen could be getting to low without it. He has a wrist band that monitors his oxygen and knows he needs to be 88 percent and above. He has no questions about his medications.   Goals/Expected Outcomes Short: Become more profiecient at using PLB.   Long: Become independent at using PLB. Short: monitor oxygen at home with exertion. Long: maintain oxygen saturations above 88 percent independently. ST: use O2 as prescribed at home LT: maintain O2 above 88 percent independently. Short: Continue to breathing better  Long; Continue to improve compliance Short: wear oxygen into rehab. Long: maintain oxygen saturations of 88 percent and above independently.          Oxygen Discharge (Final Oxygen Re-Evaluation):  Oxygen Re-Evaluation - 12/21/20 1127      Program Oxygen Prescription   Program Oxygen Prescription Continuous    Liters per minute 3      Home Oxygen   Home Oxygen Device Home Concentrator;Portable Concentrator;E-Tanks    Sleep Oxygen Prescription Continuous    Liters per minute 2    Home Exercise Oxygen Prescription Continuous    Liters per minute 2    Home Resting Oxygen Prescription Continuous    Liters per minute 2   as Needed   Compliance with Home Oxygen Use Yes      Goals/Expected Outcomes   Short Term Goals To learn and understand importance of monitoring SPO2 with pulse oximeter and demonstrate accurate use of the pulse oximeter.;To learn and understand importance of maintaining oxygen saturations>88%    Long  Term Goals Maintenance of O2 saturations>88%;Verbalizes importance of monitoring SPO2 with pulse oximeter and return demonstration    Comments Harold Parker states he is  going to use his oxygen when walking in to rehab. Informed him that his oxygen could be getting to low without it. He has a wrist band that monitors his oxygen and knows he needs to be 88 percent and above. He has no questions about his medications.    Goals/Expected Outcomes Short: wear oxygen into rehab. Long: maintain oxygen saturations of 88 percent and above independently.           Initial Exercise Prescription:  Initial Exercise Prescription - 09/13/20 1400      Date of Initial Exercise RX and Referring Provider  Date 09/13/20    Referring Provider Jayme Cloud      Recumbant Bike   Level 1    RPM 60    Minutes 15    METs 1.8      NuStep   Level 1    SPM 80    Minutes 15    METs 1.8      Arm Ergometer   Level 1    RPM 30    Minutes 15    METs 1.8      Prescription Details   Frequency (times per week) 3    Duration Progress to 30 minutes of continuous aerobic without signs/symptoms of physical distress      Intensity   THRR 40-80% of Max Heartrate 104-131    Ratings of Perceived Exertion 11-13    Perceived Dyspnea 0-4      Resistance Training   Training Prescription Yes    Weight 3 lb    Reps 10-15           Perform Capillary Blood Glucose checks as needed.  Exercise Prescription Changes:  Exercise Prescription Changes    Row Name 09/13/20 1400 09/22/20 0700 10/04/20 0800 10/19/20 0800 10/31/20 1500     Response to Exercise   Blood Pressure (Admit) 140/74 132/68 154/68 160/80 142/70   Blood Pressure (Exercise) 158/64 142/70 176/70 144/80 150/64   Blood Pressure (Exit) 138/76 132/64 144/74 130/80 122/58   Heart Rate (Admit) 78 bpm 83 bpm 79 bpm 80 bpm 82 bpm   Heart Rate (Exercise) 104 bpm 91 bpm 88 bpm 92 bpm 86 bpm   Heart Rate (Exit) 96 bpm 79 bpm 80 bpm 82 bpm 78 bpm   Oxygen Saturation (Admit) 91 % 83 % 91 % 88 % 92 %   Oxygen Saturation (Exercise) 84 % 89 % 92 % 92 % 88 %   Oxygen Saturation (Exit) 85 % 92 % 95 % 92 % 97 %   Rating of  Perceived Exertion (Exercise) 16 13 11 13 11    Perceived Dyspnea (Exercise) -- 1 1 0 2   Symptoms SOB -- SOB -- none   Duration -- Continue with 30 min of aerobic exercise without signs/symptoms of physical distress. Continue with 30 min of aerobic exercise without signs/symptoms of physical distress. Continue with 30 min of aerobic exercise without signs/symptoms of physical distress. Continue with 30 min of aerobic exercise without signs/symptoms of physical distress.   Intensity -- THRR unchanged THRR unchanged THRR unchanged THRR unchanged     Progression   Progression -- Continue to progress workloads to maintain intensity without signs/symptoms of physical distress. Continue to progress workloads to maintain intensity without signs/symptoms of physical distress. Continue to progress workloads to maintain intensity without signs/symptoms of physical distress. Continue to progress workloads to maintain intensity without signs/symptoms of physical distress.   Average METs -- 2.2 2.3 2.5 2.8     Resistance Training   Training Prescription -- Yes Yes Yes Yes   Weight -- 5 lb 5 lb 5 lb 5 lb   Reps -- 10-15 10-15 10-15 10-15     Interval Training   Interval Training -- -- No No No     Oxygen   Oxygen -- -- Continuous Continuous Continuous   Liters -- -- 3 3 3      NuStep   Level -- 1 6 6 6    SPM -- 80 -- 80 --   Minutes -- 15 30 15 15    METs -- 2.2 2.3  2.5 2.8   Row Name 11/14/20 1600 11/30/20 1400 12/14/20 1000 12/26/20 1100       Response to Exercise   Blood Pressure (Admit) 138/70 118/60 140/64 142/80    Blood Pressure (Exercise) 142/76 130/70 138/58 138/64    Blood Pressure (Exit) 128/58 118/58 122/58 126/70    Heart Rate (Admit) 83 bpm 100 bpm 74 bpm 78 bpm    Heart Rate (Exercise) 89 bpm 888 bpm 87 bpm 78 bpm    Heart Rate (Exit) 87 bpm 81 bpm 78 bpm 64 bpm    Oxygen Saturation (Admit) 94 % 90 % 89 % 88 %    Oxygen Saturation (Exercise) 93 % 92 % 87 % 88 %    Oxygen  Saturation (Exit) 92 % 95 % 92 % 98 %    Rating of Perceived Exertion (Exercise) 11 13 11 11     Perceived Dyspnea (Exercise) -- 2 0 1    Symptoms none SOB -- SOB    Duration Continue with 30 min of aerobic exercise without signs/symptoms of physical distress. Continue with 30 min of aerobic exercise without signs/symptoms of physical distress. Continue with 30 min of aerobic exercise without signs/symptoms of physical distress. Continue with 30 min of aerobic exercise without signs/symptoms of physical distress.    Intensity THRR unchanged THRR unchanged THRR unchanged THRR unchanged         Progression   Progression Continue to progress workloads to maintain intensity without signs/symptoms of physical distress. Continue to progress workloads to maintain intensity without signs/symptoms of physical distress. Continue to progress workloads to maintain intensity without signs/symptoms of physical distress. Continue to progress workloads to maintain intensity without signs/symptoms of physical distress.    Average METs 2.15 2.4 1.9 2.2         Resistance Training   Training Prescription Yes Yes Yes Yes    Weight 5 lb 5 lb 5 lb 5 lb    Reps 10-15 10-15 10-15 10-15         Interval Training   Interval Training No No No No         Oxygen   Oxygen Continuous Continuous Continuous Continuous    Liters 3 3 3 6   bad breathing day         NuStep   Level 4 6 6 4     SPM 80 -- -- --    Minutes 15 15 15 30     METs 2.15 2.4 1.9 2.2         Home Exercise Plan   Plans to continue exercise at -- Home (comment)  bike, walking Home (comment)  bike, walking Home (comment)  bike, walking    Frequency -- Add 2 additional days to program exercise sessions. Add 2 additional days to program exercise sessions. Add 2 additional days to program exercise sessions.    Initial Home Exercises Provided -- 11/02/20 11/02/20 11/02/20           Exercise Comments:   Exercise Goals and Review:  Exercise  Goals    Row Name 09/13/20 1421             Exercise Goals   Increase Physical Activity Yes       Intervention Provide advice, education, support and counseling about physical activity/exercise needs.;Develop an individualized exercise prescription for aerobic and resistive training based on initial evaluation findings, risk stratification, comorbidities and participant's personal goals.       Expected Outcomes Short Term: Attend rehab on a regular  basis to increase amount of physical activity.;Long Term: Add in home exercise to make exercise part of routine and to increase amount of physical activity.;Long Term: Exercising regularly at least 3-5 days a week.       Increase Strength and Stamina Yes       Intervention Provide advice, education, support and counseling about physical activity/exercise needs.;Develop an individualized exercise prescription for aerobic and resistive training based on initial evaluation findings, risk stratification, comorbidities and participant's personal goals.       Expected Outcomes Short Term: Increase workloads from initial exercise prescription for resistance, speed, and METs.;Short Term: Perform resistance training exercises routinely during rehab and add in resistance training at home;Long Term: Improve cardiorespiratory fitness, muscular endurance and strength as measured by increased METs and functional capacity (6MWT)       Able to understand and use rate of perceived exertion (RPE) scale Yes       Intervention Provide education and explanation on how to use RPE scale       Expected Outcomes Short Term: Able to use RPE daily in rehab to express subjective intensity level;Long Term:  Able to use RPE to guide intensity level when exercising independently       Able to understand and use Dyspnea scale Yes       Intervention Provide education and explanation on how to use Dyspnea scale       Expected Outcomes Short Term: Able to use Dyspnea scale daily in rehab  to express subjective sense of shortness of breath during exertion;Long Term: Able to use Dyspnea scale to guide intensity level when exercising independently       Knowledge and understanding of Target Heart Rate Range (THRR) Yes       Intervention Provide education and explanation of THRR including how the numbers were predicted and where they are located for reference       Expected Outcomes Short Term: Able to state/look up THRR;Short Term: Able to use daily as guideline for intensity in rehab;Long Term: Able to use THRR to govern intensity when exercising independently       Able to check pulse independently Yes       Intervention Provide education and demonstration on how to check pulse in carotid and radial arteries.;Review the importance of being able to check your own pulse for safety during independent exercise       Expected Outcomes Short Term: Able to explain why pulse checking is important during independent exercise;Long Term: Able to check pulse independently and accurately       Understanding of Exercise Prescription Yes       Intervention Provide education, explanation, and written materials on patient's individual exercise prescription       Expected Outcomes Short Term: Able to explain program exercise prescription;Long Term: Able to explain home exercise prescription to exercise independently              Exercise Goals Re-Evaluation :  Exercise Goals Re-Evaluation    Row Name 09/19/20 1126 09/22/20 0734 10/04/20 0854 10/07/20 1123 10/19/20 0826     Exercise Goal Re-Evaluation   Exercise Goals Review Increase Physical Activity;Able to understand and use rate of perceived exertion (RPE) scale;Knowledge and understanding of Target Heart Rate Range (THRR);Understanding of Exercise Prescription;Increase Strength and Stamina;Able to understand and use Dyspnea scale;Able to check pulse independently Increase Physical Activity;Increase Strength and Stamina Increase Physical  Activity;Increase Strength and Stamina;Understanding of Exercise Prescription Understanding of Exercise Prescription Increase Physical Activity;Increase Strength and  Stamina   Comments Reviewed RPE and dyspnea scales, THR and program prescription with pt today.  Pt voiced understanding and was given a copy of goals to take home. Harold Parker is off to a good start in [facility].  Staff will monitor progress. Harold Parker has requested to just use the T4 NuStep as he does not like the other machines, they feel uncomfortable. He does alternate between using both arms and legs, just legs, and just arms.  He also has difficulty with his mask,but does better with the exercise mask.  We will continue to montior his progress on the NuStep. Harold Parker states that he is interested in the [place] once he is done with the program. He is going to look into his insurance to see if he has [employer]. Informed him that the gym has oxygen tanks and would be good to continue his exercise. Stepen has been attending twice per week most weeks.  Staff will review home exercise so he can add 1-2 days at home for better progress.  He has moved up to level 6 on NS.   Expected Outcomes Short: Use RPE daily to regulate intensity. Long: Follow program prescription in THR. Short:  attend consistently Long:  improve stamina Short: Maintain spm consistently Long; Conitnue to improve stamina. Short: check with insurance if he has [employer]. Long: maintain an exercise regimine post [facility]. Short: review home exercise Long: exercise 3-5 days per week consistently   Row Name 10/31/20 1517 11/02/20 1141 11/14/20 1642 11/30/20 1112 12/14/20 1006     Exercise Goal Re-Evaluation   Exercise Goals Review Increase Physical Activity;Increase Strength and Stamina;Understanding of Exercise Prescription Increase Physical Activity;Increase Strength and Stamina;Understanding of Exercise Prescription Increase Physical Activity;Increase Strength and  Stamina;Understanding of Exercise Prescription Increase Physical Activity;Increase Strength and Stamina;Understanding of Exercise Prescription Increase Physical Activity;Increase Strength and Stamina   Comments Only attended once since last review. Jemmie continues to just use the same station as it does not hurt him at all.  We will continue to montior his progress. Reviewed home exercise with pt today.  Pt plans to walk/bike, may go to [facility] for exercise.  Reviewed THR, pulse, RPE, sign and symptoms, pulse oximetery and when to call 911 or MD.  Also discussed weather considerations and indoor options.  Pt voiced understanding. Harold Parker lowered level on T4 today.  Staff will continue to monitor progress. Harold Parker is doing well in rehab.  He is walking on his off days.  He is feeling stronger overall and better. Harold Parker has moved up to level 6 on NS.  More consistent attendance would help him progress more.   Expected Outcomes Short: Return to regular attendance  Long: Continue to exercise regularly. -- Short: get back up to previous levels Long: maintain consistent exercise Short: Continue to stay active  Long: Continue to improve stamina. Short:  attend at least twice per week Long: improve overall stamina   Row Name 12/26/20 1116 01/23/21 1003           Exercise Goal Re-Evaluation   Exercise Goals Review Increase Physical Activity;Increase Strength and Stamina --      Comments Harold Parker stated that he feels like he is making progress with his exercise and is getting stronger. He said he works at his own pace and feels like he is doing well. HR and SaO2 are maintained in acceptable ranges duing exercise. Currently admitted and out since last review      Expected Outcomes Short: continue to attend pulmonary rehab at  least 2 days a week. Long: Plans to exercise outside and be more active once the temperature increases which is easier on his breathing. --             Discharge Exercise Prescription (Final  Exercise Prescription Changes):  Exercise Prescription Changes - 12/26/20 1100      Response to Exercise   Blood Pressure (Admit) 142/80    Blood Pressure (Exercise) 138/64    Blood Pressure (Exit) 126/70    Heart Rate (Admit) 78 bpm    Heart Rate (Exercise) 78 bpm    Heart Rate (Exit) 64 bpm    Oxygen Saturation (Admit) 88 %    Oxygen Saturation (Exercise) 88 %    Oxygen Saturation (Exit) 98 %    Rating of Perceived Exertion (Exercise) 11    Perceived Dyspnea (Exercise) 1    Symptoms SOB    Duration Continue with 30 min of aerobic exercise without signs/symptoms of physical distress.    Intensity THRR unchanged      Progression   Progression Continue to progress workloads to maintain intensity without signs/symptoms of physical distress.    Average METs 2.2      Resistance Training   Training Prescription Yes    Weight 5 lb    Reps 10-15      Interval Training   Interval Training No      Oxygen   Oxygen Continuous    Liters 6   bad breathing day     NuStep   Level 4    Minutes 30    METs 2.2      Home Exercise Plan   Plans to continue exercise at Home (comment)   bike, walking   Frequency Add 2 additional days to program exercise sessions.    Initial Home Exercises Provided 11/02/20           Nutrition:  Target Goals: Understanding of nutrition guidelines, daily intake of sodium 1500mg , cholesterol 200mg , calories 30% from fat and 7% or less from saturated fats, daily to have 5 or more servings of fruits and vegetables.  Education: All About Nutrition: -Group instruction provided by verbal, written material, interactive activities, discussions, models, and posters to present general guidelines for heart healthy nutrition including fat, fiber, MyPlate, the role of sodium in heart healthy nutrition, utilization of the nutrition label, and utilization of this knowledge for meal planning. Follow up email sent as well. Written material given at  graduation. Flowsheet Row Cardiac Rehab from 09/02/2018 in [facility] Cardiac and Pulmonary Rehab  Date 08/26/18  Educator LB  Instruction Review Code 1- Verbalizes Understanding      Biometrics:  Pre Biometrics - 09/13/20 1422      Pre Biometrics   Height 5\' 5"  (1.651 m)    Weight 182 lb 14.4 oz (83 kg)    BMI (Calculated) 30.44    Single Leg Stand 10 seconds            Nutrition Therapy Plan and Nutrition Goals:  Nutrition Therapy & Goals - 11/02/20 1124      Personal Nutrition Goals   Nutrition Goal No goals at this time.    Comments Got two more books on DM at the book store this weekend. B; cereal - cherrios, rice krispies, and oatmeal, eggs and bacon, tail of ham - egss and cheese on a roll. L: sometimes will skip D: loves pasta. He eats salads, chicken, steak, escarole and beans. He doesn't care for poatoes aside from hashbrowns. S:  cookies - sleep time tea - he will also have protein drinks with his coffee in the afternoon. He reports using stevia and limiting simple sugars. He reports being through education before and not wanting to make any changes at this time.           Nutrition Assessments:  Nutrition Assessments - 09/13/20 1425      MEDFICTS Scores   Pre Score 65          MEDIFICTS Score Key:  ?70 Need to make dietary changes   40-70 Heart Healthy Diet  ? 40 Therapeutic Level Cholesterol Diet   Picture Your Plate Scores:  <91 Unhealthy dietary pattern with much room for improvement.  41-50 Dietary pattern unlikely to meet recommendations for good health and room for improvement.  51-60 More healthful dietary pattern, with some room for improvement.   >60 Healthy dietary pattern, although there may be some specific behaviors that could be improved.   Nutrition Goals Re-Evaluation:  Nutrition Goals Re-Evaluation    Row Name 11/30/20 1117 12/28/20 1144           Goals   Nutrition Goal Eat healthier Pt would not like to make goals at this time       Comment Harold Parker has gotten better about his diet and is really trying to be good through holidays.  He is feeling better about it.Harold Parker continues to have no questions regarding nutritoin and would not like to make any changes. He reports eating a mostly healthy diet.      Expected Outcome Short: Continue to focus on heart healthy Long; Continue to work on weight loss Pt would not like to make goals at this time             Nutrition Goals Discharge (Final Nutrition Goals Re-Evaluation):  Nutrition Goals Re-Evaluation - 12/28/20 1144      Goals   Nutrition Goal Pt would not like to make goals at this time    Comment Harold Parker continues to have no questions regarding nutritoin and would not like to make any changes. He reports eating a mostly healthy diet.    Expected Outcome Pt would not like to make goals at this time           Psychosocial: Target Goals: Acknowledge presence or absence of significant depression and/or stress, maximize coping skills, provide positive support system. Participant is able to verbalize types and ability to use techniques and skills needed for reducing stress and depression.   Education: Stress, Anxiety, and Depression - Group verbal and visual presentation to define topics covered.  Reviews how body is impacted by stress, anxiety, and depression.  Also discusses healthy ways to reduce stress and to treat/manage anxiety and depression.  Written material given at graduation. Flowsheet Row Pulmonary Rehab from 09/21/2020 in [facility] Cardiac and Pulmonary Rehab  Date 09/21/20  Educator [facility]  Instruction Review Code 1- [employer] Understanding      Education: Sleep Hygiene -Provides group verbal and written instruction about how sleep can affect your health.  Define sleep hygiene, discuss sleep cycles and impact of sleep habits. Review good sleep hygiene tips.  Flowsheet Row Cardiac Rehab from 09/02/2018 in [facility] Cardiac and Pulmonary Rehab  Date 08/19/18   Educator [facility]  Instruction Review Code 1- Verbalizes Understanding  [Arrived late to class]      Initial Review & Psychosocial Screening:  Initial Psych Review & Screening - 09/12/20 1116      Initial Review   Current  issues with Current Anxiety/Panic      Family Dynamics   Good Support System? Yes   Harold Parker, 6 years, family     Barriers   Psychosocial barriers to participate in program There are no identifiable barriers or psychosocial needs.;The patient should benefit from training in stress management and relaxation.;Psychosocial barriers identified (see note)      Screening Interventions   Interventions Encouraged to exercise    Expected Outcomes Short Term goal: Utilizing psychosocial counselor, staff and physician to assist with identification of specific Stressors or current issues interfering with healing process. Setting desired goal for each stressor or current issue identified.;Long Term Goal: Stressors or current issues are controlled or eliminated.;Short Term goal: Identification and review with participant of any Quality of Life or Depression concerns found by scoring the questionnaire.;Long Term goal: The participant improves quality of Life and PHQ9 Scores as seen by post scores and/or verbalization of changes           Quality of Life Scores:  Scores of 19 and below usually indicate a poorer quality of life in these areas.  A difference of  2-3 points is a clinically meaningful difference.  A difference of 2-3 points in the total score of the Quality of Life Index has been associated with significant improvement in overall quality of life, self-image, physical symptoms, and general health in studies assessing change in quality of life.  PHQ-9: Recent Review Flowsheet Data    Depression screen [facility] 2/9 09/13/2020 08/26/2018 05/01/2018   Decreased Interest 0 0 0   Down, Depressed, Hopeless 0 0 0   PHQ - 2 Score 0 0 0   Altered sleeping 0 0 0   Tired, decreased  energy 1 1 0   Change in appetite 0 - 0   Feeling bad or failure about yourself  0 0 0   Trouble concentrating 0 0 0   Moving slowly or fidgety/restless 0 0 0   Suicidal thoughts 0 0 0   PHQ-9 Score 1 1 0   Difficult doing work/chores Not difficult at all Not difficult at all Not difficult at all     Interpretation of Total Score  Total Score Depression Severity:  1-4 = Minimal depression, 5-9 = Mild depression, 10-14 = Moderate depression, 15-19 = Moderately severe depression, 20-27 = Severe depression   Psychosocial Evaluation and Intervention:  Psychosocial Evaluation - 09/12/20 1136      Psychosocial Evaluation & Interventions   Comments Harold Parker doe not have any barriers to attending the program.  He remembers attending Cardiac Rehab several years ago and is looking forward to starting Pulmonary Rehab. He does get anxiety at time when his breathing is hard. He has learned to slow down, and reset to help get his breathing back to his normal. He lives with his Harold Parker and their 2 dogs. He has a great support system with [name] and his family. He continues to be active, traveling and getting out his bike. He has purchased his own portable oxygen concentrator for at home and traveling. He should do great in the program.    Expected Outcomes STG: Kentravious will attend all scheduled sessions to gain the most from the program. LTG: Taniela will see improvement in his ADL activity and be able to maintain this improvement after discharge.    Continue Psychosocial Services  Follow up required by staff           Psychosocial Re-Evaluation:  Psychosocial Re-Evaluation    Row  Name 10/07/20 1118 11/02/20 1111 11/30/20 1114 12/26/20 1134       Psychosocial Re-Evaluation   Current issues with Current Stress Concerns;Current Anxiety/Panic Current Anxiety/Panic Current Anxiety/Panic;Current Stress Concerns Current Anxiety/Panic;Current Stress Concerns    Comments Kraven gets anxious when he gets short  of breath. His breathing is the only issue that gets him down. He states that his mind is sharp and is willing to get healthier to breath easier. He reports he is on xanax for anxiety for 7 years and reports it's helping. He reports getting overwhelmed sometimes with anxiety when things don't go right, gotten woirse since his wife passed 7 years ago. He will ride his motorcycle and watches TV to reduce stress and anxiety, he has techniques where he reduces his panic - ice cold water. Support system in girlfriend who he lives with and chicldren who are in [place]. Harold Parker is doing well with exercise.  He is frustrated about not seeing his kids and grandkids for Christmas, but they do have plans to go up in January.  He is then just worried about the cost of gas and travel.  He is planning to ride his motorcycle this weekend.  He is sleeping well and breathing better with the cooler air.  His anxiety has been up a little with his frustrations, but overall doing well on his xanax. Harold Parker reported no major changes in his anxiety. He does continue to take his anxiety medication and works with his breathing when he does become anxious. He does have a partner that he talks to for support and tries not to think to far ahead about things that he knows will make his anxious. He stated he wants to call Dr. Jayme Cloud this week to talk about his breathing difficulties when he does have anxiety attacks.    Expected Outcomes Short: continue [facility] to improve shortness of breath. Long: maintain exercise to keep stress at a minimum. Short: continue [facility] to improve shortness of breath. Long: maintain exercise to keep stress at a minimum, utilize support system, keep using relaxing activities and techniques. Short: Look forward to traveling after Christmas.  Long: Continue to manage activities. Short: Call doctor to discuss anxiety concerns. Long: continue to work on managing anxiety and maintaining good mental health.     Interventions Encouraged to attend Pulmonary Rehabilitation for the exercise Encouraged to attend Pulmonary Rehabilitation for the exercise Encouraged to attend Pulmonary Rehabilitation for the exercise Encouraged to attend Pulmonary Rehabilitation for the exercise    Continue Psychosocial Services  Follow up required by staff Follow up required by staff Follow up required by staff Follow up required by staff         Initial Review   Source of Stress Concerns -- Chronic Illness -- --           Psychosocial Discharge (Final Psychosocial Re-Evaluation):  Psychosocial Re-Evaluation - 12/26/20 1134      Psychosocial Re-Evaluation   Current issues with Current Anxiety/Panic;Current Stress Concerns    Comments Harold Parker reported no major changes in his anxiety. He does continue to take his anxiety medication and works with his breathing when he does become anxious. He does have a partner that he talks to for support and tries not to think to far ahead about things that he knows will make his anxious. He stated he wants to call Dr. Jayme Cloud this week to talk about his breathing difficulties when he does have anxiety attacks.    Expected Outcomes Short: Call doctor to  discuss anxiety concerns. Long: continue to work on managing anxiety and maintaining good mental health.    Interventions Encouraged to attend Pulmonary Rehabilitation for the exercise    Continue Psychosocial Services  Follow up required by staff           Education: Education Goals: Education classes will be provided on a weekly basis, covering required topics. Participant will state understanding/return demonstration of topics presented.  Learning Barriers/Preferences:  Learning Barriers/Preferences - 09/12/20 1121      Learning Barriers/Preferences   Learning Barriers None    Learning Preferences None           General Pulmonary Education Topics:  Infection Prevention: - Provides verbal and written material to  individual with discussion of infection control including proper hand washing and proper equipment cleaning during exercise session. Flowsheet Row Pulmonary Rehab from 09/21/2020 in [facility] Cardiac and Pulmonary Rehab  Date 09/13/20  Educator AS  Instruction Review Code 1- Verbalizes Understanding      Falls Prevention: - Provides verbal and written material to individual with discussion of falls prevention and safety. Flowsheet Row Pulmonary Rehab from 09/21/2020 in [facility] Cardiac and Pulmonary Rehab  Date 09/13/20  Educator AS  Instruction Review Code 1- Verbalizes Understanding      Chronic Lung Disease Review: - Group verbal instruction with posters, models, PowerPoint presentations and videos,  to review new updates, new respiratory medications, new advancements in procedures and treatments. Providing information on websites and "800" numbers for continued self-education. Includes information about supplement oxygen, available portable oxygen systems, continuous and intermittent flow rates, oxygen safety, concentrators, and Medicare reimbursement for oxygen. Explanation of Pulmonary Drugs, including class, frequency, complications, importance of spacers, rinsing mouth after steroid MDI's, and proper cleaning methods for nebulizers. Review of basic lung anatomy and physiology related to function, structure, and complications of lung disease. Review of risk factors. Discussion about methods for diagnosing sleep apnea and types of masks and machines for OSA. Includes a review of the use of types of environmental controls: home humidity, furnaces, filters, dust mite/pet prevention, HEPA vacuums. Discussion about weather changes, air quality and the benefits of nasal washing. Instruction on Warning signs, infection symptoms, calling MD promptly, preventive modes, and value of vaccinations. Review of effective airway clearance, coughing and/or vibration techniques. Emphasizing that all should Create an  Action Plan. Written material given at graduation.   AED/CPR: - Group verbal and written instruction with the use of models to demonstrate the basic use of the AED with the basic ABC's of resuscitation. Flowsheet Row Cardiac Rehab from 09/02/2018 in [facility] Cardiac and Pulmonary Rehab  Date 09/02/18  Educator [place]  Instruction Review Code 1- Verbalizes Understanding       Anatomy and Cardiac Procedures: - Group verbal and visual presentation and models provide information about basic cardiac anatomy and function. Reviews the testing methods done to diagnose heart disease and the outcomes of the test results. Describes the treatment choices: Medical Management, Angioplasty, or Coronary Bypass Surgery for treating various heart conditions including Myocardial Infarction, Angina, Valve Disease, and Cardiac Arrhythmias.  Written material given at graduation. Flowsheet Row Cardiac Rehab from 09/02/2018 in [facility] Cardiac and Pulmonary Rehab  Date 08/07/18  Educator [place]  Instruction Review Code 1- Verbalizes Understanding      Medication Safety: - Group verbal and visual instruction to review commonly prescribed medications for heart and lung disease. Reviews the medication, class of the drug, and side effects. Includes the steps to properly store meds and maintain the  prescription regimen.  Written material given at graduation. Flowsheet Row Cardiac Rehab from 09/02/2018 in [facility] Cardiac and Pulmonary Rehab  Date 08/12/18  Educator [place]  Instruction Review Code 1- Verbalizes Understanding      Other: -Provides group and verbal instruction on various topics (see comments)   Knowledge Questionnaire Score:  Knowledge Questionnaire Score - 09/13/20 1424      Knowledge Questionnaire Score   Pre Score 16/18            Core Components/Risk Factors/Patient Goals at Admission:  Personal Goals and Risk Factors at Admission - 09/13/20 1426      Core Components/Risk Factors/Patient Goals on Admission     Weight Management Yes;Weight Loss    Intervention Weight Management: Develop a combined nutrition and exercise program designed to reach desired caloric intake, while maintaining appropriate intake of nutrient and fiber, sodium and fats, and appropriate energy expenditure required for the weight goal.    Admit Weight 182 lb 14.4 oz (83 kg)    Goal Weight: Short Term 180 lb (81.6 kg)    Goal Weight: Long Term 175 lb (79.4 kg)    Expected Outcomes Short Term: Continue to assess and modify interventions until short term weight is achieved;Long Term: Adherence to nutrition and physical activity/exercise program aimed toward attainment of established weight goal;Weight Loss: Understanding of general recommendations for a balanced deficit meal plan, which promotes 1-2 lb weight loss per week and includes a negative energy balance of [phone] kcal/d    Diabetes Yes    Intervention Provide education about signs/symptoms and action to take for hypo/hyperglycemia.;Provide education about proper nutrition, including hydration, and aerobic/resistive exercise prescription along with prescribed medications to achieve blood glucose in normal ranges: Fasting glucose 65-99 mg/dL    Expected Outcomes Short Term: Participant verbalizes understanding of the signs/symptoms and immediate care of hyper/hypoglycemia, proper foot care and importance of medication, aerobic/resistive exercise and nutrition plan for blood glucose control.;Long Term: Attainment of HbA1C < 7%.    Hypertension Yes    Intervention Provide education on lifestyle modifcations including regular physical activity/exercise, weight management, moderate sodium restriction and increased consumption of fresh fruit, vegetables, and low fat dairy, alcohol moderation, and smoking cessation.;Monitor prescription use compliance.    Expected Outcomes Short Term: Continued assessment and intervention until BP is < 140/57mm HG in hypertensive participants. <  130/19mm HG in hypertensive participants with diabetes, heart failure or chronic kidney disease.;Long Term: Maintenance of blood pressure at goal levels.    Lipids Yes    Intervention Provide education and support for participant on nutrition & aerobic/resistive exercise along with prescribed medications to achieve LDL 70mg , HDL >40mg .    Expected Outcomes Short Term: Participant states understanding of desired cholesterol values and is compliant with medications prescribed. Participant is following exercise prescription and nutrition guidelines.;Long Term: Cholesterol controlled with medications as prescribed, with individualized exercise RX and with personalized nutrition plan. Value goals: LDL < 70mg , HDL > 40 mg.    Personal Goal Other Yes           Education:Diabetes - Individual verbal and written instruction to review signs/symptoms of diabetes, desired ranges of glucose level fasting, after meals and with exercise. Acknowledge that pre and post exercise glucose checks will be done for 3 sessions at entry of program. Flowsheet Row Cardiac Rehab from 09/02/2018 in [facility] Cardiac and Pulmonary Rehab  Date 05/01/18  Educator [facility]  Instruction Review Code 1- Verbalizes Understanding      Know Your Numbers and Heart  Failure: - Group verbal and visual instruction to discuss disease risk factors for cardiac and pulmonary disease and treatment options.  Reviews associated critical values for Overweight/Obesity, Hypertension, Cholesterol, and Diabetes.  Discusses basics of heart failure: signs/symptoms and treatments.  Introduces Heart Failure Zone chart for action plan for heart failure.  Written material given at graduation. Flowsheet Row Cardiac Rehab from 09/02/2018 in [facility] Cardiac and Pulmonary Rehab  Date 08/07/18  Educator CE  Instruction Review Code 1- Verbalizes Understanding      Core Components/Risk Factors/Patient Goals Review:   Goals and Risk Factor Review    Row Name 10/07/20  1116 11/02/20 1120 11/30/20 1121 12/26/20 1123       Core Components/Risk Factors/Patient Goals Review   Personal Goals Review Improve shortness of breath with ADL's Improve shortness of breath with ADL's;Diabetes Improve shortness of breath with ADL's;Diabetes;Weight Management/Obesity;Hypertension Improve shortness of breath with ADL's;Diabetes;Weight Management/Obesity;Hypertension    Review If he is doing chores at home and if things are too strennuous he get anxious and short of breath.Spoke to patient about their shortness of breath and what they can do to improve. Patient has been informed of breathing techniques when starting the program. Patient is informed to tell staff if they have had any med changes and that certain meds they are taking or not taking can be causing shortness of breath. This weekend BG got up to 400 after syrup and pineapple juice - took 2 metformin and it went down to 40. Feeling better now. BG in am usually 125. Educated on hypo and hyperglycemia - how to identify and how to normalize BG. He reports doing better with ADLs at home - will get worse if he moves too quickly or carries things that are too heavy. Harold Parker is officially two years out from qutting smoking.  His breathing is getting better.  His sugars have been doing well overall.  His pressures continue to do well for him. Harold Parker reports taking all medications and monitoring blood sugars at home. His weight has been steady and he manages his SOB with O2 as needed depending on temperature and air pressure.    Expected Outcomes Short: Attend [facility] regularly to improve shortness of breath with ADL's. Long: maintain independence with ADL's Short: Attend [facility] regularly to improve shortness of breath with ADL's, use O2 as prescribed, continue with management of BG (limiting simple sugars) Long: maintain independence with ADL's Short: Continue to work on breathing  Long: Continue to manage risk factors. Short: continue to  work on purse lip breathing to manage SOB and continue to take all medications as prescribed. Long: Continue heart healthy lifestyle to manage cardiac risk factors.           Core Components/Risk Factors/Patient Goals at Discharge (Final Review):   Goals and Risk Factor Review - 12/26/20 1123      Core Components/Risk Factors/Patient Goals Review   Personal Goals Review Improve shortness of breath with ADL's;Diabetes;Weight Management/Obesity;Hypertension    Review Harold Parker reports taking all medications and monitoring blood sugars at home. His weight has been steady and he manages his SOB with O2 as needed depending on temperature and air pressure.    Expected Outcomes Short: continue to work on purse lip breathing to manage SOB and continue to take all medications as prescribed. Long: Continue heart healthy lifestyle to manage cardiac risk factors.           ITP Comments:  ITP Comments    Row Name 09/12/20 1129 09/13/20 1438  09/19/20 1125 09/21/20 0618 10/19/20 0717   ITP Comments Virtual orientation call completed today. he has an appointment on Date: [id number] for EP eval and gym Orientation.  Documentation of diagnosis can be found in [facility] Date: 04/14/2020. Completed and gym orientation. Initial ITP created and sent for review to Dr. Bethann Punches, Medical Director. First full day of exercise!  Patient was oriented to gym and equipment including functions, settings, policies, and procedures.  Patient's individual exercise prescription and treatment plan were reviewed.  All starting workloads were established based on the results of the 6 minute walk test done at initial orientation visit.  The plan for exercise progression was also introduced and progression will be customized based on patient's performance and goals. 30 Day review completed. Medical Director ITP review done, changes made as directed, and signed approval by Medical Director. 30 Day review completed. Medical Director ITP  review done, changes made as directed, and signed approval by Medical Director.   Row Name 11/16/20 1043 12/14/20 8119 01/11/21 0945 01/11/21 1202 01/23/21 1002   ITP Comments 30 Day review completed. Medical Director ITP review done, changes made as directed, and signed approval by Medical Director. 30 Day review completed. Medical Director ITP review done, changes made as directed, and signed approval by Medical Director. 30 Day review completed. Medical Director ITP review done, changes made as directed, and signed approval by Medical Director. Harold Parker has been out with sick - diagnosed with COVID 01/04/21. Harold Parker has been admitted to the hospital with COVID pneumonia and respitory failure due to COVID. Still admitted with potential d/c today.   Row Name 02/08/21 0704           ITP Comments 30 Day review completed. Medical Director ITP review done, changes made as directed, and signed approval by Medical Director.              Comments:

## 2021-02-08 NOTE — Progress Notes (Signed)
Patient received IV lasix 40 mg today, he was educated and encouraged to use urinal for voiding so  RN could trak and measure output, patient not compliant with given instructions.

## 2021-02-08 NOTE — Progress Notes (Signed)
PROGRESS NOTE    Harold Parker  WPY:[id number] DOB: 05/15/1946 DOA: 02/04/2021 PCP: Merlene Laughter, MD   Chief complaint.  Shortness of breath. Brief Narrative:   Patient has end-stage COPD and was on 2 L of oxygen prior to admission with Covid (1/26) where he was sent home on 4 L of oxygen.  The patient was readmitted on 02/05/2020 with shortness of breath.    Assessment & Plan:   Principal Problem:   Acute on chronic respiratory failure with hypoxia (HCC) Active Problems:   COPD with acute exacerbation (HCC)   CAD (coronary artery disease)   Ischemic cardiomyopathy   Type 2 diabetes mellitus with hyperglycemia, without long-term current use of insulin (HCC)   Stage 3 severe COPD by GOLD classification (HCC)   S/P CABG x 3   S/P aortic valve replacement with bioprosthetic valve    Chronic systolic CHF (congestive heart failure) (HCC)   Anxiety   AKI (acute kidney injury) (HCC)   History of COVID-19 pneumonia 01/11/21   Pulmonary nodules   Insomnia  #1.  Acute on chronic hypoxemic respiratory failure. COPD exacerbation.  End-stage COPD Congestive systolic congestive heart failure. Patient still on 6 L oxygen this morning, currently down to 4 L oxygen.  He states that he still has significant paroxysmal nocturnal dyspnea and orthopnea, which has been happening chronically.  His BNP was 293 at time of admission. He does not have bacterial pneumonia as his procalcitonin level less than 0.1. He does not have significant bronchospasm, he is on inhaled steroids.  Systemic steroids was avoided due to severe hyperglycemia. I will give him a dose of IV Lasix today.  He probably can be discharged home tomorrow if it become more stable  #2.  Insomnia and anxiety. Patient is sleeping much better now.  3.  Uncontrolled type 2 diabetes with hyperglycemia. Continue current regimen, hemoglobin A1c 7.5.  4.  Pulmonary nodule. Follow-up with pulmonology as outpatient.  5.  Acute kidney  injury. Renal function improved        DVT prophylaxis: Lovenox Code Status: Full Family Communication:  Disposition Plan:  .   Status is: Inpatient  Remains inpatient appropriate because:Inpatient level of care appropriate due to severity of illness   Dispo: The patient is from: Home              Anticipated d/c is to: Home              Anticipated d/c date is: 1 day              Patient currently is not medically stable to d/c.   Difficult to place patient No        I/O last 3 completed shifts: In: 1080 [P.O.:1080] Out: 2275 [Urine:2275] No intake/output data recorded.     Consultants:   cardiology  Procedures: None  Antimicrobials: None  Subjective: Patient still has some orthopnea and paroxysmal active dyspnea, but much improved since starting on sleeping pills. He has short of breath with minimal exertion. He does not have any abdominal pain or nausea vomiting.  He had multiple soft stools yesterday, no diarrhea. No fever or chills. No dysuria or hematuria.  Objective: Vitals:   02/07/21 2047 02/08/21 0435 02/08/21 0842 02/08/21 1124  BP: (!) 146/75 136/81 (!) 111/59 (!) 107/52  Pulse: 95 96 94 82  Resp: (!) 22 20 19 19   Temp: 98 F (36.7 C) 98.7 F (37.1 C) 97.7 F (36.5 C) 97.9 F (36.6 C)  TempSrc:  Oral Oral Oral   SpO2: 94% 92% 90% 93%  Weight:  77.5 kg    Height:        Intake/Output Summary (Last 24 hours) at 02/08/2021 1301 Last data filed at 02/08/2021 1124 Gross per 24 hour  Intake 600 ml  Output 1400 ml  Net -800 ml   Filed Weights   02/06/21 0349 02/07/21 0432 02/08/21 0435  Weight: 81.2 kg 77.6 kg 77.5 kg    Examination:  General exam: Appears calm and comfortable  Respiratory system: Decreased breathing sounds with some crackles in the base. Respiratory effort normal. Cardiovascular system: S1 & S2 heard, RRR. No JVD, murmurs, rubs, gallops or clicks. No pedal edema. Gastrointestinal system: Abdomen is  nondistended, soft and nontender. No organomegaly or masses felt. Normal bowel sounds heard. Central nervous system: Alert and oriented. No focal neurological deficits. Extremities: Symmetric 5 x 5 power. Skin: No rashes, lesions or ulcers Psychiatry:  Mood & affect appropriate.     Data Reviewed: I have personally reviewed following labs and imaging studies  CBC: Recent Labs  Lab 02/04/21 0406 02/05/21 0450 02/06/21 0531 02/07/21 0759  WBC 9.0 9.7 6.5  --   NEUTROABS 6.1  --   --   --   HGB 13.2 11.9* 14.3 13.5  HCT 38.5* 34.2* 42.2  --   MCV 89.5 88.4 91.1  --   PLT 148* 191 217  --    Basic Metabolic Panel: Recent Labs  Lab 02/04/21 0406 02/04/21 1305 02/05/21 0450 02/06/21 0531 02/07/21 0759  NA 131*  --  132* 135 135  K 3.8  --  4.5 4.6 4.4  CL 99  --  100 101 98  CO2 22  --  24 27 29   GLUCOSE 223*  --  343* 257* 197*  BUN 16  --  38* 38* 26*  CREATININE 0.97  --  1.32* 1.17 1.05  CALCIUM 8.2*  --  9.2 9.3 9.5  MG  --  2.0 2.3  --   --   PHOS  --   --  4.0  --   --    GFR: Estimated Creatinine Clearance: 58.7 mL/min (by C-G formula based on SCr of 1.05 mg/dL). Liver Function Tests: Recent Labs  Lab 02/04/21 0406  AST 22  ALT 34  ALKPHOS 66  BILITOT 1.2  PROT 6.5  ALBUMIN 2.8*   No results for input(s): LIPASE, AMYLASE in the last 168 hours. No results for input(s): AMMONIA in the last 168 hours. Coagulation Profile: No results for input(s): INR, PROTIME in the last 168 hours. Cardiac Enzymes: No results for input(s): CKTOTAL, CKMB, CKMBINDEX, TROPONINI in the last 168 hours. BNP (last 3 results) No results for input(s): PROBNP in the last 8760 hours. HbA1C: No results for input(s): HGBA1C in the last 72 hours. CBG: Recent Labs  Lab 02/07/21 1137 02/07/21 1648 02/07/21 2051 02/08/21 0838 02/08/21 1123  GLUCAP 180* 261* 116* 223* 306*   Lipid Profile: No results for input(s): CHOL, HDL, LDLCALC, TRIG, CHOLHDL, LDLDIRECT in the last 72  hours. Thyroid Function Tests: No results for input(s): TSH, T4TOTAL, FREET4, T3FREE, THYROIDAB in the last 72 hours. Anemia Panel: No results for input(s): VITAMINB12, FOLATE, FERRITIN, TIBC, IRON, RETICCTPCT in the last 72 hours. Sepsis Labs: Recent Labs  Lab 02/04/21 0406  PROCALCITON <0.10    No results found for this or any previous visit (from the past 240 hour(s)).       Radiology Studies: No results found.  Scheduled Meds: . ALPRAZolam  0.5 mg Oral BID  . amLODipine  5 mg Oral Daily  . arformoterol  15 mcg Nebulization BID  . aspirin EC  81 mg Oral Daily  . budesonide (PULMICORT) nebulizer solution  0.5 mg Nebulization BID  . busPIRone  10 mg Oral BID  . cholecalciferol  1,000 Units Oral BH-[id number]  . enoxaparin (LOVENOX) injection  40 mg Subcutaneous Q24H  . ezetimibe  10 mg Oral QHS  . folic acid  500 mcg Oral QPM  . furosemide  40 mg Intravenous Once  . furosemide  20 mg Oral Daily  . glipiZIDE  10 mg Oral Q breakfast  . insulin aspart  0-15 Units Subcutaneous TID WC  . insulin aspart  0-5 Units Subcutaneous QHS  . lidocaine  1 patch Transdermal Q24H  . losartan  100 mg Oral Daily  . melatonin  10 mg Oral QHS  . metoprolol succinate  100 mg Oral Daily  . pantoprazole  40 mg Oral Daily  . revefenacin  175 mcg Nebulization Daily  . sucralfate  1 g Oral TID WC & HS  . traZODone  50 mg Oral QHS   Continuous Infusions:   LOS: 4 days    Time spent: 28 minutes     Marrion Coy, MD [facility]   To contact the attending provider between 7A-7P or the covering provider during after hours 7P-7A, please log into the web site [email or web address] and access using universal [place] password for that web site. If you do not have the password, please call the hospital operator.  02/08/2021, 1:01 PM

## 2021-02-09 LAB — GLUCOSE, CAPILLARY: Glucose-Capillary: 212 mg/dL — ABNORMAL HIGH (ref 70–99)

## 2021-02-09 LAB — CBC WITH DIFFERENTIAL/PLATELET
Abs Immature Granulocytes: 0.14 10*3/uL — ABNORMAL HIGH (ref 0.00–0.07)
Basophils Absolute: 0.1 10*3/uL (ref 0.0–0.1)
Basophils Relative: 1 %
Eosinophils Absolute: 0.4 10*3/uL (ref 0.0–0.5)
Eosinophils Relative: 6 %
HCT: 37.3 % — ABNORMAL LOW (ref 39.0–52.0)
Hemoglobin: 12.8 g/dL — ABNORMAL LOW (ref 13.0–17.0)
Immature Granulocytes: 2 %
Lymphocytes Relative: 27 %
Lymphs Abs: 1.7 10*3/uL (ref 0.7–4.0)
MCH: 30.7 pg (ref 26.0–34.0)
MCHC: 34.3 g/dL (ref 30.0–36.0)
MCV: 89.4 fL (ref 80.0–100.0)
Monocytes Absolute: 0.8 10*3/uL (ref 0.1–1.0)
Monocytes Relative: 12 %
Neutro Abs: 3.3 10*3/uL (ref 1.7–7.7)
Neutrophils Relative %: 52 %
Platelets: 310 10*3/uL (ref 150–400)
RBC: 4.17 MIL/uL — ABNORMAL LOW (ref 4.22–5.81)
RDW: 13.9 % (ref 11.5–15.5)
WBC: 6.4 10*3/uL (ref 4.0–10.5)
nRBC: 0 % (ref 0.0–0.2)

## 2021-02-09 LAB — BASIC METABOLIC PANEL
Anion gap: 8 (ref 5–15)
BUN: 33 mg/dL — ABNORMAL HIGH (ref 8–23)
CO2: 26 mmol/L (ref 22–32)
Calcium: 9.2 mg/dL (ref 8.9–10.3)
Chloride: 101 mmol/L (ref 98–111)
Creatinine, Ser: 1.09 mg/dL (ref 0.61–1.24)
GFR, Estimated: >60 mL/min (ref >60)
Glucose, Bld: 226 mg/dL — ABNORMAL HIGH (ref 70–99)
Potassium: 3.5 mmol/L (ref 3.5–5.1)
Sodium: 135 mmol/L (ref 135–145)

## 2021-02-09 LAB — MAGNESIUM: Magnesium: 1.9 mg/dL (ref 1.7–2.4)

## 2021-02-09 LAB — BRAIN NATRIURETIC PEPTIDE: B Natriuretic Peptide: 196.8 pg/mL — ABNORMAL HIGH (ref 0.0–100.0)

## 2021-02-09 MED ORDER — BUSPIRONE HCL 5 MG PO TABS: 10.0000 mg | ORAL_TABLET | Freq: Two times a day (BID) | ORAL | Status: DC

## 2021-02-09 NOTE — Plan of Care (Signed)
  Problem: Education: Goal: Knowledge of General Education information will improve Description: Including pain rating scale, medication(s)/side effects and non-pharmacologic comfort measures Outcome: Adequate for Discharge   Problem: Health Behavior/Discharge Planning: Goal: Ability to manage health-related needs will improve Outcome: Adequate for Discharge   Problem: Clinical Measurements: Goal: Ability to maintain clinical measurements within normal limits will improve Outcome: Adequate for Discharge Goal: Will remain free from infection Outcome: Adequate for Discharge Goal: Diagnostic test results will improve Outcome: Adequate for Discharge Goal: Respiratory complications will improve Outcome: Adequate for Discharge Goal: Cardiovascular complication will be avoided Outcome: Adequate for Discharge   Problem: Nutrition: Goal: Adequate nutrition will be maintained Outcome: Adequate for Discharge   Problem: Activity: Goal: Risk for activity intolerance will decrease Outcome: Adequate for Discharge   Problem: Coping: Goal: Level of anxiety will decrease Outcome: Adequate for Discharge   Problem: Elimination: Goal: Will not experience complications related to bowel motility Outcome: Adequate for Discharge Goal: Will not experience complications related to urinary retention Outcome: Adequate for Discharge   Problem: Skin Integrity: Goal: Risk for impaired skin integrity will decrease Outcome: Adequate for Discharge   Problem: Education: Goal: Knowledge of disease or condition will improve Outcome: Adequate for Discharge Goal: Knowledge of the prescribed therapeutic regimen will improve Outcome: Adequate for Discharge Goal: Individualized Educational Video(s) Outcome: Adequate for Discharge   Problem: Activity: Goal: Ability to tolerate increased activity will improve Outcome: Adequate for Discharge Goal: Will verbalize the importance of balancing activity with  adequate rest periods Outcome: Adequate for Discharge   Problem: Respiratory: Goal: Ability to maintain a clear airway will improve Outcome: Adequate for Discharge Goal: Levels of oxygenation will improve Outcome: Adequate for Discharge Goal: Ability to maintain adequate ventilation will improve Outcome: Adequate for Discharge

## 2021-02-09 NOTE — Discharge Summary (Signed)
Physician Discharge Summary  Patient ID: Harold Parker MRN: [id number] DOB/AGE: June 29, 1946 75 y.o.  Admit date: 02/04/2021 Discharge date: 02/09/2021  Admission Diagnoses:  Discharge Diagnoses:  Principal Problem:   Acute on chronic respiratory failure with hypoxia (HCC) Active Problems:   COPD with acute exacerbation (HCC)   CAD (coronary artery disease)   Ischemic cardiomyopathy   Type 2 diabetes mellitus with hyperglycemia, without long-term current use of insulin (HCC)   Stage 3 severe COPD by GOLD classification (HCC)   S/P CABG x 3   S/P aortic valve replacement with bioprosthetic valve    Chronic systolic CHF (congestive heart failure) (HCC)   Anxiety   AKI (acute kidney injury) (HCC)   History of COVID-19 pneumonia 01/11/21   Pulmonary nodules   Insomnia   Discharged Condition: fair  Hospital Course:   Patient has end-stage COPD and was on 2 L of oxygen prior to admission with Covid (1/26)where he was sent home on 4 L of oxygen. The patient was readmitted on 02/05/2020 with shortness of breath.  #1.  Acute on chronic hypoxemic respiratory failure. COPD exacerbation.  End-stage COPD Chronic systolic congestive heart failure. Recent Covid pneumonia. Patient condition improving, currently on 4 L oxygen, which is his new baseline. He no longer has any bronchospasm. He received 1 dose IV Lasix yesterday due to mild elevation of BNP. Condition seem to be back to baseline. However, patient prognosis is poor based on my clinical judgment. May consider palliative care after seen by PCP.   #2.  Insomnia and anxiety. Patient is sleeping much better now.  3.  Uncontrolled type 2 diabetes with hyperglycemia. Continue current regimen, hemoglobin A1c 7.5.  4.  Pulmonary nodule. Follow-up with PCP as outpatient.  5.  Acute kidney injury. Renal function improved    Consults: None  Significant Diagnostic Studies:  CT ANGIOGRAPHY CHEST WITH  CONTRAST  TECHNIQUE: Multidetector CT imaging of the chest was performed using the standard protocol during bolus administration of intravenous contrast. Multiplanar CT image reconstructions and MIPs were obtained to evaluate the vascular anatomy.  CONTRAST:  36mL OMNIPAQUE IOHEXOL 350 MG/ML SOLN  COMPARISON:  02/25/2019  FINDINGS: Cardiovascular: Mild scratch set heart size upper limits of normal. Previous median sternotomy and CABG procedure. Aortic atherosclerosis.  The main pulmonary artery is patent. No lobar or segmental pulmonary artery filling defects.  Mediastinum/Nodes: No enlarged axillary or supraclavicular lymph nodes. Prominent mediastinal lymph nodes are identified including 1.1 cm low right paratracheal lymph node, image 40/4. This is unchanged from previous exam. Left pre-vascular node measures 1.3 cm, image 38/4. Previously 1.1 cm. Subcarinal node measures 1.2 cm, image 48/4. Previously 0.8 cm. Prominent bilateral hilar lymphoid none of which meet CT criteria for adenopathy.  Lungs/Pleura: Moderate changes of centrilobular emphysema.  Patchy area of atelectasis and airspace density is noted within the posterior left lung base. Mild subsegmental atelectasis is noted in the subpleural right lung base.  Patchy irregular nodular density within the superior segment of the left lower lobe measures 1.9 cm, image 47/6. New compared with 02/25/2019.  Adjacent part solid nodule measures 1 cm, image 53/6. New compared with 02/25/2019.  Small round solid nodule in the superior segment of right lower lobe measures 6 mm, image 50/6. New from previous exam.  Within the right upper lobe there is a 5 mm lung nodule, image 28/6. New from previous exam.  Upper Abdomen: Bilateral kidney cysts are again noted. The largest arises from the upper pole of right kidney measuring 5.5 cm,  image 90/4. Aortic atherosclerosis noted. Prominent gastrohepatic  ligament node measures 1.2 cm, image 90/4.  Musculoskeletal: No acute osseous findings mild multilevel degenerative disc disease noted within the lower thoracic spine.  Review of the MIP images confirms the above findings.  IMPRESSION: 1. No evidence for acute pulmonary embolus. 2. Patchy area of atelectasis and airspace density is noted within the posterior left lung base. Findings are nonspecific but are favored to reflect inflammatory/infectious process. 3. Scattered lung nodules, new from previous exam. The dominant nodule is in the superior segment of left lower lobe measuring 1.9 cm. Non-contrast chest CT at 3-6 months is recommended. If the nodules are stable at time of repeat CT, then future CT at 18-24 months (from today's scan) is considered optional for low-risk patients, but is recommended for high-risk patients. This recommendation follows the consensus statement: Guidelines for Management of Incidental Pulmonary Nodules Detected on CT Images: From the Fleischner Society 2017; Radiology 2017; 284:228-243. 4. Prominent mediastinal and hilar lymph nodes are again noted. These are nonspecific in the setting of pneumonia and may be reactive in etiology. Attention on follow-up imaging is advised.  Aortic Atherosclerosis (ICD10-I70.0) and Emphysema (ICD10-J43.9).   Electronically Signed   By: Signa Kell M.D.   On: 02/04/2021 06:54    Treatments: antibiotics,  Discharge Exam: Blood pressure 132/60, pulse (!) 103, temperature 98.4 F (36.9 C), temperature source Oral, resp. rate 19, height 5\' 6"  (1.676 m), weight 78.6 kg, SpO2 93 %. General appearance: alert and cooperative Resp: Decreased breathing sounds without crackles or wheezes. Cardio: regular rate and rhythm, S1, S2 normal, no murmur, click, rub or gallop GI: soft, non-tender; bowel sounds normal; no masses,  no organomegaly Extremities: extremities normal, atraumatic, no cyanosis or  edema  Disposition: Discharge disposition: 01-Home or Self Care       Discharge Instructions    Diet - low sodium heart healthy   Complete by: As directed    Increase activity slowly   Complete by: As directed      Allergies as of 02/09/2021      Reactions   Atorvastatin Other (See Comments)   Leg aches and weakness      Medication List    STOP taking these medications   vitamin C 1000 MG tablet     TAKE these medications   acetaminophen 500 MG tablet Commonly known as: TYLENOL Take 1,000 mg by mouth daily as needed for moderate pain or headache.   ALPRAZolam 0.5 MG tablet Commonly known as: XANAX Take 0.5 mg by mouth 2 (two) times daily.   amLODipine 5 MG tablet Commonly known as: NORVASC Take 5 mg by mouth daily.   aspirin EC 81 MG tablet Take 81 mg by mouth daily.   Breztri Aerosphere 160-9-4.8 MCG/ACT Aero Generic drug: Budeson-Glycopyrrol-Formoterol TAKE 2 PUFFS BY MOUTH TWICE A DAY (STOP TRELEGY)   busPIRone 5 MG tablet Commonly known as: BUSPAR Take 5 mg by mouth 2 (two) times daily.   esomeprazole 40 MG capsule Commonly known as: NEXIUM Take 40 mg by mouth at bedtime.   ezetimibe 10 MG tablet Commonly known as: ZETIA Take 10 mg by mouth at bedtime.   folic acid 400 MCG tablet Commonly known as: FOLVITE Take 400 mcg by mouth every evening.   furosemide 40 MG tablet Commonly known as: LASIX TAKE 1 TABLET BY MOUTH EVERY DAY   Giltuss Diabetic Cough & Cold 10-100 MG/5ML liquid Generic drug: dextromethorphan-guaiFENesin Take 30 mLs by mouth every 4 (four) hours as  needed for cough.   glipiZIDE 5 MG tablet Commonly known as: GLUCOTROL Take 0.5 tablets (2.5 mg total) by mouth daily before breakfast. What changed:   how much to take  when to take this  additional instructions   guaiFENesin 600 MG 12 hr tablet Commonly known as: MUCINEX Take 1,200 mg by mouth every 12 (twelve) hours as needed.   ipratropium-albuterol 0.5-2.5 (3)  MG/3ML Soln Commonly known as: DUONEB Take 3 mLs by nebulization every 6 (six) hours as needed. What changed: reasons to take this   losartan 100 MG tablet Commonly known as: COZAAR Take 100 mg by mouth daily.   Melatonin 10 MG Caps Take 10 mg by mouth at bedtime.   metFORMIN 1000 MG tablet Commonly known as: GLUCOPHAGE TAKE 1 TABLET BY MOUTH TWICE DAILY WITH A MEAL What changed: when to take this   metoprolol succinate 100 MG 24 hr tablet Commonly known as: TOPROL-XL TAKE 1 TABLET BY MOUTH EVERY DAY WITH OR IMMEDIATELY FOLLOWING A MEAL   ProAir HFA 108 (90 Base) MCG/ACT inhaler Generic drug: albuterol Inhale 1 puff into the lungs every 4 (four) hours as needed for wheezing or shortness of breath.   sucralfate 1 GM/10ML suspension Commonly known as: CARAFATE Take 10 mLs (1 g total) by mouth 4 (four) times daily -  with meals and at bedtime.   Vitamin D (Cholecalciferol) 25 MCG (1000 UT) Tabs Take 1,000 Units by mouth every morning.       Follow-up Information    Stoneking, Hal, MD Follow up in 1 week(s).   Specialty: Internal Medicine Contact information: [address] [place] [address] [phone]        Iran Ouch, MD .   Specialty: Cardiology Contact information: [address] [place] [address] [phone]              34 minutes Signed: Marrion Coy 02/09/2021, 8:41 AM

## 2021-02-15 ENCOUNTER — Encounter: Payer: Self-pay | Admitting: *Deleted

## 2021-02-15 ENCOUNTER — Telehealth: Payer: Self-pay | Admitting: *Deleted

## 2021-02-15 DIAGNOSIS — J449 Chronic obstructive pulmonary disease, unspecified: Secondary | ICD-10-CM

## 2021-02-15 NOTE — Telephone Encounter (Signed)
Called to check on pt.  He has been in hospital in and out with COVID and respiratory failure.  He is still feeling pretty out of it and very fatigue.  He is staying out of the public for right now. He is not quite ready to come back yet and wants to hold out for two weeks. He did sign a DNR yesterday.

## 2021-02-16 ENCOUNTER — Other Ambulatory Visit: Payer: Self-pay

## 2021-02-16 ENCOUNTER — Ambulatory Visit (INDEPENDENT_AMBULATORY_CARE_PROVIDER_SITE_OTHER): Payer: Medicare Other | Admitting: Cardiovascular Disease

## 2021-02-16 VITALS — BP 98/62 | HR 78 | Ht 66.0 in | Wt 167.0 lb

## 2021-02-16 DIAGNOSIS — Z953 Presence of xenogenic heart valve: Secondary | ICD-10-CM | POA: Diagnosis not present

## 2021-02-16 DIAGNOSIS — I5022 Chronic systolic (congestive) heart failure: Secondary | ICD-10-CM

## 2021-02-16 DIAGNOSIS — I10 Essential (primary) hypertension: Secondary | ICD-10-CM | POA: Diagnosis not present

## 2021-02-16 DIAGNOSIS — I739 Peripheral vascular disease, unspecified: Secondary | ICD-10-CM

## 2021-02-16 DIAGNOSIS — I2510 Atherosclerotic heart disease of native coronary artery without angina pectoris: Secondary | ICD-10-CM

## 2021-02-16 DIAGNOSIS — E785 Hyperlipidemia, unspecified: Secondary | ICD-10-CM

## 2021-02-16 DIAGNOSIS — J449 Chronic obstructive pulmonary disease, unspecified: Secondary | ICD-10-CM

## 2021-02-16 NOTE — Patient Instructions (Addendum)
Medication Instructions:  Your physician has recommended you make the following change in your medication:   STOP Amlodipine  *If you need a refill on your cardiac medications before your next appointment, please call your pharmacy*   Lab Work: None ordered If you have labs (blood work) drawn today and your tests are completely normal, you will receive your results only by: Marland Kitchen MyChart Message (if you have MyChart) OR . A paper copy in the mail If you have any lab test that is abnormal or we need to change your treatment, we will call you to review the results.   Testing/Procedures: None ordered   Follow-Up: At [facility], you and your health needs are our priority.  As part of our continuing mission to provide you with exceptional heart care, we have created designated Provider Care Teams.  These Care Teams include your primary Cardiologist (physician) and Advanced Practice Providers (APPs -  Physician Assistants and Nurse Practitioners) who all work together to provide you with the care you need, when you need it.  We recommend signing up for the patient portal called "MyChart".  Sign up information is provided on this After Visit Summary.  MyChart is used to connect with patients for Virtual Visits (Telemedicine).  Patients are able to view lab/test results, encounter notes, upcoming appointments, etc.  Non-urgent messages can be sent to your provider as well.   To learn more about what you can do with MyChart, go to [email or web address].    Your next appointment:   3 month(s)  The format for your next appointment:   In Person  Provider:   You may see [name], MD or one of the following Advanced Practice Providers on your designated Care Team:    Nicolasa Ducking, NP  Eula Listen, PA-C  Marisue Ivan, PA-C  Cadence Parker, [place]  Gillian Shields, NP    Other Instructions  Dr. Kirke Corin recommend that you have your appt with pulmonology Dr. Jayme Cloud moved  up.  The psychiatrist that saw you in the hospital is Dr. Mordecai Rasmussen

## 2021-02-16 NOTE — Progress Notes (Signed)
Cardiology Office Note   Date:  02/16/2021   ID:  Harold Parker, DOB October 23, 1946, MRN [id number]  PCP:  Merlene Laughter, MD  Cardiologist:   [name], MD  Chief Complaint  Patient presents with  . Hospitalization Follow-up      History of Present Illness: Harold Parker is a 75 y.o. male who presents for a follow-up visit regarding peripheral arterial disease, coronary artery disease and aortic stenosis status post CABG and bioprosthetic aortic valve in March 2019.  Other medical problems include essential hypertension, hyperlipidemia, type 2 diabetes, COPD and previous tobacco use. 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  He presented in February of 2019 with non-ST elevation myocardial infarction. Cardiac catheterization revealed severe left main stenosis in addition to significant disease involving OM1 and RCA. EF was 40 to 45% with moderate aortic stenosis. The patient underwent CABG and aortic valve replacement.He had postoperative atrial fibrillation that was controlled with amiodarone. He was discharged home on aspirin and Plavix. He presented back with an upper GI bleed with a hemoglobin 4.7. EGD showed duodenal ulcers.  The patient is known to have bilateral leg claudication.  Angiography in March 2020 showed no significant aortoiliac disease.  On the right, there was long heavily calcified occlusion of the SFA with very well-developed collaterals from the profunda and three-vessel runoff below the knee.  On the left, there was heavily calcified disease affecting the left SFA with three-vessel runoff below the knee.  I performed successful orbital atherectomy and drug-coated balloon angioplasty to the left SFA.  He was hospitalized in early February with COVID-19 pneumonia.  He was treated with steroids and remdesivir.  His oxygen requirement increased to 4 L/min.  He was rehospitalized on February 19 with continued  shortness of breath.  He was again treated with steroids.  Oxygen could not be decreased to below 4 L and it was established that this is his new baseline after COVID-19 infection. He had an echocardiogram done during his hospitalization which showed an EF of 40 to 45%, moderate pulmonary hypertension with estimated RV systolic pressure of 58 mmHg and mild mitral regurgitation.  He has been doing reasonably well and is sleeping better at night.  He continues to have significant dyspnea and still requiring 4 L of oxygen.  No chest pain.  He does feel slightly dizzy and his blood pressure is low.  Past Medical History:  Diagnosis Date  . Bilateral carotid bruits    a. 01/2018 U/S: < 50% bilat ICA stenosis.  Marland Kitchen CAD (coronary artery disease)    a. 1998 s/p MI and BMS [facility] [place]); b. 1999 redo PCI/rotablator in setting of what sounds like ISR;  c. Multiple stress tests over the years - last ~ 2017, reportedly nl; d. 01/2018 NSTEMI/Cath: LM 95m/d, LAD 50p, 40p/m, D1 60ost, OM1 95, RCA 100ost/p w/ L->R collats, EF 45%; e. s/p 3V CABG 02/19/18 (LIMA-LAD, VG-D1, VG-OM)  . Chronic lower back pain   . COPD (chronic obstructive pulmonary disease) (HCC)   . GIB (gastrointestinal bleeding)    a. 02/2018 GIB and anemia w/ Hgb of 4.7 on presentation; b. 03/2018 EGD: 2 nonbleeding duodenal ulcers.  Marland Kitchen HTN (hypertension)   . Hypercholesteremia   . Ischemic cardiomyopathy    a. 01/2018 Echo: EF 40-45%, mid-apicalanteroseptal, ant, apical sev HK, mod apicalinferior HK. Gr2 DD. Mod AS, mild MR, mod dil LA, PASP .  . Moderate aortic stenosis  a. 01/2018 Echo: Mod AS, mean grad (S) , Valve area (VTI) 1.06 cm^2, (Vmax) 1.27 cm^2; b. 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  . Myocardial infarction (HCC) ~ 1998/1999  . 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 02/19/2018 AVR: 25 mm Edwards Inspiris Resilia stented bovine pericardial tissue valve  . 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   LIMA to LAD, SVG  to D1, SVG to OM, EVH via right thigh and leg  . Tobacco abuse   . Type II diabetes mellitus (HCC)     Past Surgical History:  Procedure Laterality Date  . ABDOMINAL AORTOGRAM N/A 03/04/2019   Procedure: ABDOMINAL AORTOGRAM;  Surgeon: Iran Ouch, MD;  Location: [facility];  Service: Cardiovascular;  Laterality: N/A;  . AORTIC VALVE REPLACEMENT N/A 02/19/2018   Procedure: AORTIC VALVE REPLACEMENT (AVR);  Surgeon: Purcell Nails, MD;  Location: [facility] OR;  Service: Open Heart Surgery;  Laterality: N/A;  . COLONOSCOPY    . CORONARY ANGIOPLASTY WITH STENT PLACEMENT  ~ 1998/1999  . CORONARY ARTERY BYPASS GRAFT N/A 02/19/2018   Procedure: CORONARY ARTERY BYPASS GRAFTING (CABG) x three , using left internal mammary artery and right leg greater saphenous vein harvested endoscopically;  Surgeon: Purcell Nails, MD;  Location: [facility] OR;  Service: Open Heart Surgery;  Laterality: N/A;  . ESOPHAGOGASTRODUODENOSCOPY (EGD) WITH PROPOFOL N/A 03/18/2018   Procedure: ESOPHAGOGASTRODUODENOSCOPY (EGD) WITH PROPOFOL;  Surgeon: Midge Minium, MD;  Location: [facility] ENDOSCOPY;  Service: Endoscopy;  Laterality: N/A;  . LEFT HEART CATH AND CORONARY ANGIOGRAPHY N/A 02/06/2018   Procedure: LEFT HEART CATH AND CORONARY ANGIOGRAPHY;  Surgeon: Iran Ouch, MD;  Location: [facility] INVASIVE CV LAB;  Service: Cardiovascular;  Laterality: N/A;  . LOWER EXTREMITY ANGIOGRAPHY Bilateral 03/04/2019   Procedure: Lower Extremity Angiography;  Surgeon: Iran Ouch, MD;  Location: [facility] LAB;  Service: Cardiovascular;  Laterality: Bilateral;  . PERIPHERAL VASCULAR ATHERECTOMY Left 03/04/2019   Procedure: PERIPHERAL VASCULAR ATHERECTOMY;  Surgeon: Iran Ouch, MD;  Location: [facility] LAB;  Service: Cardiovascular;  Laterality: Left;  SFA  . TEE WITHOUT CARDIOVERSION N/A 02/19/2018   Procedure: TRANSESOPHAGEAL ECHOCARDIOGRAM (TEE);  Surgeon: Purcell Nails, MD;  Location: [facility] OR;  Service: Open Heart Surgery;   Laterality: N/A;  . TONSILLECTOMY       Current Outpatient Medications  Medication Sig Dispense Refill  . acetaminophen (TYLENOL) 500 MG tablet Take 1,000 mg by mouth daily as needed for moderate pain or headache.    . ALPRAZolam (XANAX) 0.5 MG tablet Take 0.5 mg by mouth 2 (two) times daily.    Marland Kitchen aspirin EC 81 MG tablet Take 81 mg by mouth daily.    Marland Kitchen BREZTRI AEROSPHERE 160-9-4.8 MCG/ACT AERO TAKE 2 PUFFS BY MOUTH TWICE A DAY (STOP TRELEGY) 32.1 g 3  . busPIRone (BUSPAR) 5 MG tablet Take 2 tablets (10 mg total) by mouth 2 (two) times daily.    Marland Kitchen dextromethorphan-guaiFENesin (ROBITUSSIN-DM) 10-100 MG/5ML liquid Take 30 mLs by mouth every 4 (four) hours as needed for cough.    . esomeprazole (NEXIUM) 40 MG capsule Take 40 mg by mouth at bedtime.     Marland Kitchen ezetimibe (ZETIA) 10 MG tablet Take 10 mg by mouth at bedtime.     . folic acid (FOLVITE) 400 MCG tablet Take 400 mcg by mouth every evening.    . furosemide (LASIX) 40 MG tablet TAKE 1 TABLET BY MOUTH EVERY DAY 90 tablet 3  . glipiZIDE (GLUCOTROL) 5 MG  tablet Take 0.5 tablets (2.5 mg total) by mouth daily before breakfast. (Patient taking differently: Take 5 mg by mouth See admin instructions. Take 5 mg in the morning and take a second 5 mg dose at night on Mon, Wed, and Fri) 30 tablet 1  . guaiFENesin (MUCINEX) 600 MG 12 hr tablet Take 1,200 mg by mouth every 12 (twelve) hours as needed.    Marland Kitchen ipratropium-albuterol (DUONEB) 0.5-2.5 (3) MG/3ML SOLN Take 3 mLs by nebulization every 6 (six) hours as needed. (Patient taking differently: Take 3 mLs by nebulization every 6 (six) hours as needed (shortness of breath).) 360 mL 1  . losartan (COZAAR) 100 MG tablet Take 100 mg by mouth daily.    . Melatonin 10 MG CAPS Take 10 mg by mouth at bedtime.    . metFORMIN (GLUCOPHAGE) 1000 MG tablet TAKE 1 TABLET BY MOUTH TWICE DAILY WITH A MEAL (Patient taking differently: Take 1,000 mg by mouth 2 (two) times daily.) 60 tablet 0  . metoprolol succinate  (TOPROL-XL) 100 MG 24 hr tablet TAKE 1 TABLET BY MOUTH EVERY DAY WITH OR IMMEDIATELY FOLLOWING A MEAL 90 tablet 1  . PROAIR HFA 108 (90 Base) MCG/ACT inhaler Inhale 1 puff into the lungs every 4 (four) hours as needed for wheezing or shortness of breath.    . sucralfate (CARAFATE) 1 GM/10ML suspension Take 10 mLs (1 g total) by mouth 4 (four) times daily -  with meals and at bedtime. 420 mL 0  . traZODone (DESYREL) 50 MG tablet Take 50 mg by mouth at bedtime as needed.    . Vitamin D, Cholecalciferol, 1000 units TABS Take 1,000 Units by mouth every morning.      No current facility-administered medications for this visit.    Allergies:   Atorvastatin    Social History:  The patient  reports that he quit smoking about 3 years ago. His smoking use included cigarettes. He has a 40.00 pack-year smoking history. He has never used smokeless tobacco. He reports current alcohol use. He reports that he does not use drugs.   Family History:  The patient's family history includes Lymphoma in his mother; Peripheral vascular disease in his father.    ROS:  Please see the history of present illness.   Otherwise, review of systems are positive for none.   All other systems are reviewed and negative.    PHYSICAL EXAM: VS:  BP 98/62   Pulse 78   Ht 5\' 6"  (1.676 m)   Wt 167 lb (75.8 kg)   BMI 26.95 kg/m  , BMI Body mass index is 26.95 kg/m. GEN: Well nourished, well developed, in no acute distress  HEENT: normal  Neck: no JVD, carotid bruits, or masses Cardiac: RRR; no  rubs, or gallops,no edema, no murmur. Respiratory: Mild bilateral rhonchi and diminished breath sounds. GI: soft, nontender, nondistended, + BS MS: no deformity or atrophy  Skin: warm and dry, no rash Neuro:  Strength and sensation are intact Psych: euthymic mood, full affect    EKG:  EKG is ordered today. The ekg ordered today demonstrates normal sinus rhythm with right bundle branch block, with prior inferior  infarct.  Recent Labs: 02/04/2021: ALT 34; TSH 0.627 02/09/2021: B Natriuretic Peptide 196.8; BUN 33; Creatinine, Ser 1.09; Hemoglobin 12.8; Magnesium 1.9; Platelets 310; Potassium 3.5; Sodium 135    Lipid Panel    Component Value Date/Time   CHOL 90 01/12/2021 0445   CHOL 102 03/27/2018 0900   TRIG 87 01/12/2021 0445  HDL 28 (L) 01/12/2021 0445   HDL 35 (L) 03/27/2018 0900   CHOLHDL 3.2 01/12/2021 0445   VLDL 17 01/12/2021 0445   LDLCALC 45 01/12/2021 0445   LDLCALC 50 03/27/2018 0900      Wt Readings from Last 3 Encounters:  02/16/21 167 lb (75.8 kg)  02/09/21 173 lb 4.5 oz (78.6 kg)  01/24/21 191 lb 12.8 oz (87 kg)       No flowsheet data found.    ASSESSMENT AND PLAN:  1. Peripheral arterial disease:  Status post atherectomy and drug-coated balloon angioplasty of the left SFA.  He has mild bilateral calf claudication.  Most recent vascular studies showed an ABI in the 0.7 range with patent left SFA.  2. Coronary artery disease involving native coronary arterieswithout angina:Status post CABG in March 2019. He is doing better overall.  3. Status post bioprosthetic aortic valve replacement for aortic stenosis: Most recent echo showed normal functioning bioprosthetic aortic valve.  4. Chronic systolic heart failure with mildly reduced LV systolic function.  Continue treatment with Toprol and losartan.  He appears to be euvolemic on current dose of furosemide 40 mg daily.    5. Hyperlipidemia: He is tolerating high-dose atorvastatin and Zetia.  He had labs done with his primary care physician but results are not available.  Recommend a target LDL of less than 70.  6.  Essential hypertension: Blood pressure is low.  I discontinued amlodipine.  7.  COPD: Significant worsening with recent COVID pneumonia.  He is still requiring 4 L of oxygen and does not seem to be improving.  I will try to expedite his appointment with Dr. Jayme Cloud.     Disposition:   FU  with me in 3 month   Signed, [name], MD 02/16/21 [facility] [place], [place] [phone]

## 2021-02-22 ENCOUNTER — Encounter: Payer: Self-pay | Admitting: *Deleted

## 2021-02-22 ENCOUNTER — Ambulatory Visit: Payer: Medicare Other | Admitting: Family

## 2021-02-22 DIAGNOSIS — J449 Chronic obstructive pulmonary disease, unspecified: Secondary | ICD-10-CM

## 2021-03-01 ENCOUNTER — Ambulatory Visit (INDEPENDENT_AMBULATORY_CARE_PROVIDER_SITE_OTHER): Payer: Medicare Other | Admitting: Pulmonary Disease

## 2021-03-01 ENCOUNTER — Encounter: Payer: Self-pay | Admitting: Pulmonary Disease

## 2021-03-01 ENCOUNTER — Other Ambulatory Visit: Payer: Self-pay

## 2021-03-01 VITALS — BP 104/68 | HR 80 | Temp 97.3°F | Ht 66.0 in | Wt 171.6 lb

## 2021-03-01 DIAGNOSIS — J9611 Chronic respiratory failure with hypoxia: Secondary | ICD-10-CM

## 2021-03-01 DIAGNOSIS — I255 Ischemic cardiomyopathy: Secondary | ICD-10-CM | POA: Diagnosis not present

## 2021-03-01 DIAGNOSIS — I272 Pulmonary hypertension, unspecified: Secondary | ICD-10-CM | POA: Diagnosis not present

## 2021-03-01 DIAGNOSIS — J449 Chronic obstructive pulmonary disease, unspecified: Secondary | ICD-10-CM | POA: Diagnosis not present

## 2021-03-01 MED ORDER — FORMOTEROL FUMARATE 20 MCG/2ML IN NEBU: 20.0000 ug | INHALATION_SOLUTION | Freq: Two times a day (BID) | RESPIRATORY_TRACT | 11 refills | Status: DC

## 2021-03-01 MED ORDER — REVEFENACIN 175 MCG/3ML IN SOLN: 175.0000 ug | Freq: Every day | RESPIRATORY_TRACT | 11 refills | Status: DC

## 2021-03-01 MED ORDER — BUDESONIDE 0.5 MG/2ML IN SUSP: 0.5000 mg | Freq: Two times a day (BID) | RESPIRATORY_TRACT | 11 refills | Status: DC

## 2021-03-01 MED ORDER — ALBUTEROL SULFATE (2.5 MG/3ML) 0.083% IN NEBU: 2.5000 mg | INHALATION_SOLUTION | Freq: Four times a day (QID) | RESPIRATORY_TRACT | 12 refills | Status: DC | PRN

## 2021-03-01 NOTE — Progress Notes (Signed)
Subjective:    Patient ID: Harold Parker, male    DOB: July 20, 1946, 75 y.o.   MRN: [id number]  HPI 75 year old male, former smoker, with stage III COPD and chronic respiratory failure with hypoxia on supplemental oxygen, who presents as a post hospital visit.  He was admitted to [facility] from 01/11/2021 through 01/24/2021 due to acute hypoxemic respiratory failure due to COVID-19.  He was discharged home on 4 L/min nasal cannula O2.  He was discharged home but subsequently had to be readmitted on 02/04/2021 due to tachycardia and increasing shortness of breath he had been using his albuterol 8-9 times a day on top of his regular maintenance medications for COPD.  Patient was subsequently discharged on 09 February 2021.  Instructions were given to the hospitalist team asked to discharge medications.  The patient had been switched to formoterol with budesonide and Yupelri.  Today he presents for follow-up.  He has been working with physical therapy and notes that he is making very slow progress.  He is still requiring 4 L/min via nasal cannula to maintain saturations between 88 to 92% with ambulation.  He notes that when he was in the hospital he felt better on the formoterol budesonide and Yupelri.  He has been discharged on Breztri and DuoNeb as needed.  This is a course of duplication of muscarinic agents.  He does not note any other symptomatology.  As noted he is working with physical therapy in the home.  Review of Systems A 10 point review of systems was performed and it is as noted above otherwise negative.  Patient Active Problem List   Diagnosis Date Noted  . Insomnia   . History of COVID-19 pneumonia 01/11/21 02/04/2021  . Pulmonary nodules   . Acute respiratory disease due to COVID-19 virus 01/11/2021  . HTN (hypertension)   . Chronic systolic CHF (congestive heart failure) (HCC)   . COPD (chronic obstructive pulmonary disease) (HCC)   . GERD (gastroesophageal reflux disease)   . Anxiety   .  AKI (acute kidney injury) (HCC)   . Hyperkalemia   . Elevated troponin   . Acute hypoxemic respiratory failure due to COVID-19 (HCC)   . Melena   . Duodenal ulceration   . Gastrointestinal hemorrhage   . Severe anemia 03/14/2018  . 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 S/P aortic valve replacement with bioprosthetic valve  02/19/2018  . Preoperative respiratory examination 02/13/2018  . Acute on chronic respiratory failure with hypoxia (HCC) 02/11/2018  . Influenza with respiratory manifestation 02/11/2018  . Stage 3 severe COPD by GOLD classification (HCC)   . Aortic stenosis   . Bronchitis, chronic obstructive, with exacerbation (HCC)   . Tobacco abuse   . CAD (coronary artery disease) 02/07/2018  . Acute systolic CHF (congestive heart failure) (HCC) 02/07/2018  . Ischemic cardiomyopathy 02/07/2018  . Type 2 diabetes mellitus with hyperglycemia, without long-term current use of insulin (HCC) 02/07/2018  . Hyperlipidemia LDL goal <70 02/07/2018  . COPD with acute exacerbation (HCC) 02/05/2018  . NSTEMI (non-ST elevated myocardial infarction) (HCC) 02/05/2018   Allergies  Allergen Reactions  . Atorvastatin Other (See Comments)    Leg aches and weakness    Current Meds  Medication Sig  . acetaminophen (TYLENOL) 500 MG tablet Take 1,000 mg by mouth daily as needed for moderate pain or headache.  . ALPRAZolam (XANAX) 0.5 MG tablet Take 0.5 mg by mouth 2 (two) times daily.  Marland Kitchen aspirin EC 81 MG tablet Take 81 mg by  mouth daily.  Marland Kitchen BREZTRI AEROSPHERE 160-9-4.8 MCG/ACT AERO TAKE 2 PUFFS BY MOUTH TWICE A DAY (STOP TRELEGY)  . busPIRone (BUSPAR) 5 MG tablet Take 2 tablets (10 mg total) by mouth 2 (two) times daily.  Marland Kitchen dextromethorphan-guaiFENesin (ROBITUSSIN-DM) 10-100 MG/5ML liquid Take 30 mLs by mouth every 4 (four) hours as needed for cough.  . esomeprazole (NEXIUM) 40 MG capsule Take 40 mg by mouth at bedtime.   Marland Kitchen ezetimibe (ZETIA) 10 MG tablet Take 10 mg by mouth at bedtime.   . folic acid  (FOLVITE) 400 MCG tablet Take 400 mcg by mouth every evening.  . furosemide (LASIX) 40 MG tablet TAKE 1 TABLET BY MOUTH EVERY DAY  . glipiZIDE (GLUCOTROL) 5 MG tablet Take 0.5 tablets (2.5 mg total) by mouth daily before breakfast. (Patient taking differently: Take 5 mg by mouth See admin instructions. Take 5 mg in the morning and take a second 5 mg dose at night on Mon, Wed, and Fri)  . guaiFENesin (MUCINEX) 600 MG 12 hr tablet Take 1,200 mg by mouth every 12 (twelve) hours as needed.  Marland Kitchen ipratropium-albuterol (DUONEB) 0.5-2.5 (3) MG/3ML SOLN Take 3 mLs by nebulization every 6 (six) hours as needed. (Patient taking differently: Take 3 mLs by nebulization every 6 (six) hours as needed (shortness of breath).)  . losartan (COZAAR) 100 MG tablet Take 100 mg by mouth daily.  . Melatonin 10 MG CAPS Take 10 mg by mouth at bedtime.  . metFORMIN (GLUCOPHAGE) 1000 MG tablet TAKE 1 TABLET BY MOUTH TWICE DAILY WITH A MEAL (Patient taking differently: Take 1,000 mg by mouth 2 (two) times daily.)  . metoprolol succinate (TOPROL-XL) 100 MG 24 hr tablet TAKE 1 TABLET BY MOUTH EVERY DAY WITH OR IMMEDIATELY FOLLOWING A MEAL  . PROAIR HFA 108 (90 Base) MCG/ACT inhaler Inhale 1 puff into the lungs every 4 (four) hours as needed for wheezing or shortness of breath.  . sucralfate (CARAFATE) 1 GM/10ML suspension Take 10 mLs (1 g total) by mouth 4 (four) times daily -  with meals and at bedtime.  . traZODone (DESYREL) 50 MG tablet Take 50 mg by mouth at bedtime as needed.  . Vitamin D, Cholecalciferol, 1000 units TABS Take 1,000 Units by mouth every morning.    Immunization History  Administered Date(s) Administered  . Influenza Inj Mdck Quad Pf 12/02/2017  . Influenza Split 12/11/2014, 11/01/2019  . Influenza, High Dose Seasonal PF 09/16/2018, 08/21/2019, 11/01/2020  . PFIZER(Purple Top)SARS-COV-2 Vaccination 02/06/2020, 02/29/2020       Objective:   Physical Exam BP 104/68 (BP Location: Left Arm)   Pulse 80    Temp (!) 97.3 F (36.3 C) (Temporal)   Ht 5\' 6"  (1.676 m)   Wt 171 lb 9.6 oz (77.8 kg)   SpO2 99%   BMI 27.70 kg/m  GENERAL: Chronically ill-appearing man, well nourished, well developed.No conversational dyspnea. Comfortable with nasal cannula O2.  Cannula was kinked, replaced with new cannula HEAD: Normocephalic, atraumatic.  EYES: Pupils equal, round, reactive to light. No scleral icterus.  MOUTH: Oral mucosa moist. No thrush. NECK: Supple. No thyromegaly. Trachea midline. No JVD. No adenopathy. PULMONARY: Distant breath sounds. Coarse breath sounds with no other adventitious sounds. CARDIOVASCULAR: S1 and S2. Regular rate and rhythm. 1/6 systolic murmur at the lower left sternal border. ABDOMEN: Benign. MUSCULOSKELETAL: No joint deformity, no clubbing, no edema.  NEUROLOGIC: No overt focal deficit, speech is fluent. SKIN: Intact,warm,dry.  On limited exam no rashes. PSYCH:Mood and behavior normal    Assessment &  Plan:     ICD-10-CM   1. Stage 3 severe COPD by GOLD classification (HCC)  J44.9    We will switch to medications via nebulizer Perforomist 20 mg twice a day via nebulizer Budesonide 0.5 mg twice a day via nebulizer Yupelri once a day  2. Chronic respiratory failure with hypoxia (HCC)  J96.11    Continue oxygen at 4 L/min  3. Ischemic cardiomyopathy  I25.5    This issue adds complexity to his management Followed by cardiology  4. Pulmonary hypertension (HCC)  I27.20    Continue oxygen supplementation Likely due to chronic hypoxic vasoconstriction/COPD    Meds ordered this encounter  Medications  . formoterol (PERFOROMIST) 20 MCG/2ML nebulizer solution    Sig: Take 2 mLs (20 mcg total) by nebulization 2 (two) times daily.    Dispense:  120 mL    Refill:  11  . budesonide (PULMICORT) 0.5 MG/2ML nebulizer solution    Sig: Take 2 mLs (0.5 mg total) by nebulization 2 (two) times daily. Use AFTER Perforomist    Dispense:  120 mL    Refill:  11  .  revefenacin (YUPELRI) 175 MCG/3ML nebulizer solution    Sig: Take 3 mLs (175 mcg total) by nebulization daily. Can be mixed with Perforomist    Dispense:  90 mL    Refill:  11  . albuterol (PROVENTIL) (2.5 MG/3ML) 0.083% nebulizer solution    Sig: Take 3 mLs (2.5 mg total) by nebulization every 6 (six) hours as needed for wheezing or shortness of breath.    Dispense:  75 mL    Refill:  12   Discussion:  On his most recent hospitalization he did markedly better on nebulizer medications.  His COPD is quite advanced and his breath-holding capacity is not optimal for metered-dose inhaler.  We will discontinue Breztri and DuoNeb and place him on the regimen as above.  Follow-up will be in 3 to 4 weeks time with me or the APP.  He is to contact us prior to that time should any new difficulties arise.  Gailen Shelter, MD [facility]   *This note was dictated using voice recognition software/Dragon.  Despite best efforts to proofread, errors can occur which can change the meaning.  Any change was purely unintentional.

## 2021-03-01 NOTE — Patient Instructions (Signed)
PLEASE PUT ASIDE (DO NOT USE): BREZTRI, DUONEB (IPRATROPIUM/ALBUTEROL) NEBULIZER SOLUTION  You may use: Albuterol inhaler (ProAir) if you are out and about as needed for shortness of breath.   We are switching your medications to be taken via nebulizer:  Perforomist (formoterol), 1 vial via nebulizer morning and evening  Budesonide (Pulmicort) 1 vial via nebulizer after Brovana,  morning and evening  Yupelri 1 vial via nebulizer daily (this 1 can be mixed with the Perforomist)  You may use albuterol as needed either via inhaler as above or if at home and/or very short of breath you can use the nebulizer.   We will see you in follow-up in 3 to 4 weeks time with either me or the nurse practitioner.

## 2021-03-08 ENCOUNTER — Encounter: Payer: Self-pay | Admitting: *Deleted

## 2021-03-08 ENCOUNTER — Telehealth: Payer: Self-pay

## 2021-03-08 DIAGNOSIS — J449 Chronic obstructive pulmonary disease, unspecified: Secondary | ICD-10-CM

## 2021-03-08 NOTE — Telephone Encounter (Signed)
Dr Kirke Corin and Dr Jayme Cloud advised Harold Parker to wait a couple more weeks before returning to [facility] to allow his lungs to get stronger.  He sees them again April 4

## 2021-03-08 NOTE — Progress Notes (Signed)
Pulmonary Individual Treatment Plan  Patient Details  Name: Harold Parker MRN: [id number] Date of Birth: 1946-02-09 Referring Provider:   Flowsheet Row Pulmonary Rehab from 09/13/2020 in [facility] Cardiac and Pulmonary Rehab  Referring Provider Jayme Cloud      Initial Encounter Date:  Flowsheet Row Pulmonary Rehab from 09/13/2020 in [facility] Cardiac and Pulmonary Rehab  Date 09/13/20      Visit Diagnosis: Chronic obstructive pulmonary disease, unspecified COPD type (HCC)  Patient's Home Medications on Admission:  Current Outpatient Medications:  .  acetaminophen (TYLENOL) 500 MG tablet, Take 1,000 mg by mouth daily as needed for moderate pain or headache., Disp: , Rfl:  .  albuterol (PROVENTIL) (2.5 MG/3ML) 0.083% nebulizer solution, Take 3 mLs (2.5 mg total) by nebulization every 6 (six) hours as needed for wheezing or shortness of breath., Disp: 75 mL, Rfl: 12 .  ALPRAZolam (XANAX) 0.5 MG tablet, Take 0.5 mg by mouth 2 (two) times daily., Disp: , Rfl:  .  aspirin EC 81 MG tablet, Take 81 mg by mouth daily., Disp: , Rfl:  .  budesonide (PULMICORT) 0.5 MG/2ML nebulizer solution, Take 2 mLs (0.5 mg total) by nebulization 2 (two) times daily. Use AFTER Perforomist, Disp: 120 mL, Rfl: 11 .  busPIRone (BUSPAR) 5 MG tablet, Take 2 tablets (10 mg total) by mouth 2 (two) times daily., Disp: , Rfl:  .  dextromethorphan-guaiFENesin (ROBITUSSIN-DM) 10-100 MG/5ML liquid, Take 30 mLs by mouth every 4 (four) hours as needed for cough., Disp: , Rfl:  .  esomeprazole (NEXIUM) 40 MG capsule, Take 40 mg by mouth at bedtime. , Disp: , Rfl:  .  ezetimibe (ZETIA) 10 MG tablet, Take 10 mg by mouth at bedtime. , Disp: , Rfl:  .  folic acid (FOLVITE) 400 MCG tablet, Take 400 mcg by mouth every evening., Disp: , Rfl:  .  formoterol (PERFOROMIST) 20 MCG/2ML nebulizer solution, Take 2 mLs (20 mcg total) by nebulization 2 (two) times daily., Disp: 120 mL, Rfl: 11 .  furosemide (LASIX) 40 MG tablet, TAKE 1 TABLET BY MOUTH  EVERY DAY, Disp: 90 tablet, Rfl: 3 .  glipiZIDE (GLUCOTROL) 5 MG tablet, Take 0.5 tablets (2.5 mg total) by mouth daily before breakfast. (Patient taking differently: Take 5 mg by mouth See admin instructions. Take 5 mg in the morning and take a second 5 mg dose at night on Mon, Wed, and Fri), Disp: 30 tablet, Rfl: 1 .  guaiFENesin (MUCINEX) 600 MG 12 hr tablet, Take 1,200 mg by mouth every 12 (twelve) hours as needed., Disp: , Rfl:  .  losartan (COZAAR) 100 MG tablet, Take 100 mg by mouth daily., Disp: , Rfl:  .  Melatonin 10 MG CAPS, Take 10 mg by mouth at bedtime., Disp: , Rfl:  .  metFORMIN (GLUCOPHAGE) 1000 MG tablet, TAKE 1 TABLET BY MOUTH TWICE DAILY WITH A MEAL (Patient taking differently: Take 1,000 mg by mouth 2 (two) times daily.), Disp: 60 tablet, Rfl: 0 .  metoprolol succinate (TOPROL-XL) 100 MG 24 hr tablet, TAKE 1 TABLET BY MOUTH EVERY DAY WITH OR IMMEDIATELY FOLLOWING A MEAL, Disp: 90 tablet, Rfl: 1 .  PROAIR HFA 108 (90 Base) MCG/ACT inhaler, Inhale 1 puff into the lungs every 4 (four) hours as needed for wheezing or shortness of breath., Disp: , Rfl:  .  revefenacin (YUPELRI) 175 MCG/3ML nebulizer solution, Take 3 mLs (175 mcg total) by nebulization daily. Can be mixed with Perforomist, Disp: 90 mL, Rfl: 11 .  sucralfate (CARAFATE) 1 GM/10ML suspension, Take 10  mLs (1 g total) by mouth 4 (four) times daily -  with meals and at bedtime., Disp: 420 mL, Rfl: 0 .  traZODone (DESYREL) 50 MG tablet, Take 50 mg by mouth at bedtime as needed., Disp: , Rfl:  .  Vitamin D, Cholecalciferol, 1000 units TABS, Take 1,000 Units by mouth every morning. , Disp: , Rfl:   Past Medical History: Past Medical History:  Diagnosis Date  . Bilateral carotid bruits    a. 01/2018 U/S: < 50% bilat ICA stenosis.  Marland Kitchen CAD (coronary artery disease)    a. 1998 s/p MI and BMS [facility], [place]); b. 1999 redo PCI/rotablator in setting of what sounds like ISR;  c. Multiple stress tests over the years - last ~ 2017,  reportedly nl; d. 01/2018 NSTEMI/Cath: LM 67m/d, LAD 50p, 40p/m, D1 60ost, OM1 95, RCA 100ost/p w/ L->R collats, EF 45%; e. s/p 3V CABG 02/19/18 (LIMA-LAD, VG-D1, VG-OM)  . Chronic lower back pain   . COPD (chronic obstructive pulmonary disease) (HCC)   . GIB (gastrointestinal bleeding)    a. 02/2018 GIB and anemia w/ Hgb of 4.7 on presentation; b. 03/2018 EGD: 2 nonbleeding duodenal ulcers.  Marland Kitchen HTN (hypertension)   . Hypercholesteremia   . Ischemic cardiomyopathy    a. 01/2018 Echo: EF 40-45%, mid-apicalanteroseptal, ant, apical sev HK, mod apicalinferior HK. Gr2 DD. Mod AS, mild MR, mod dil LA, PASP .  . Moderate aortic stenosis    a. 01/2018 Echo: Mod AS, mean grad (S) , Valve area (VTI) 1.06 cm^2, (Vmax) 1.27 cm^2; b. 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  . Myocardial infarction (HCC) ~ 1998/1999  . 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 02/19/2018 AVR: 25 mm Edwards Inspiris Resilia stented bovine pericardial tissue valve  . 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 Tobacco abuse   . Type II diabetes mellitus (HCC)     Tobacco Use: Social History   Tobacco Use  Smoking Status Former Smoker  . Packs/day: 1.00  . Years: 40.00  . Pack years: 40.00  . Types: Cigarettes  . Quit date: 02/02/2018  . Years since quitting: 3.0  Smokeless Tobacco Never Used  Tobacco Comment   Quit 01/2018 - on 05/01/2018 talked to Mansel about [employer] concerns. He ststaes that he has no taste for tobacco since he quit in Feb.    Labs: Recent Review Flowsheet Data    Labs for ITP Cardiac and Pulmonary Rehab Latest Ref Rng & Units 02/20/2018 03/27/2018 01/11/2021 01/12/2021 02/06/2021   Cholestrol 0 - 200 mg/dL - 161 - 90 -   LDLCALC 0 - 99 mg/dL - 50 - 45 -   HDL >09 mg/dL - 60(A) - 54(U) -   Trlycerides <150 mg/dL - 84 55 87 -   Hemoglobin A1c 4.8 - 5.6 % - - - 7.5(H) -   PHART 7.350 - 7.450 7.331(L) - - - 7.47(H)   PCO2ART 32.0  - 48.0 mmHg 41.0 - - - 36   HCO3 20.0 - 28.0 mmol/L 21.5 - - - 26.2   TCO2 22 - 32 mmol/L 23 - - - -   ACIDBASEDEF 0.0 - 2.0 mmol/L 4.0(H) - - - -   O2SAT % 95.0 - - - 92.6       Pulmonary Assessment Scores:  Pulmonary Assessment Scores    Row Name 09/13/20 1422  ADL UCSD   SOB Score total 21     Rest 0     Walk 2     Stairs 3     Bath 2     Dress 0     Shop 0           CAT Score   CAT Score 16           mMRC Score   mMRC Score 3            UCSD: Self-administered rating of dyspnea associated with activities of daily living (ADLs) 6-point scale (0 = "not at all" to 5 = "maximal or unable to do because of breathlessness")  Scoring Scores range from 0 to 120.  Minimally important difference is 5 units  CAT: CAT can identify the health impairment of COPD patients and is better correlated with disease progression.  CAT has a scoring range of zero to 40. The CAT score is classified into four groups of low (less than 10), medium (10 - 20), high (21-30) and very high (31-40) based on the impact level of disease on health status. A CAT score over 10 suggests significant symptoms.  A worsening CAT score could be explained by an exacerbation, poor medication adherence, poor inhaler technique, or progression of COPD or comorbid conditions.  CAT MCID is 2 points  mMRC: mMRC (Modified Medical Research Council) Dyspnea Scale is used to assess the degree of baseline functional disability in patients of respiratory disease due to dyspnea. No minimal important difference is established. A decrease in score of 1 point or greater is considered a positive change.   Pulmonary Function Assessment:   Exercise Target Goals: Exercise Program Goal: Individual exercise prescription set using results from initial 6 min walk test and THRR while considering  patient's activity barriers and safety.   Exercise Prescription Goal: Initial exercise prescription builds to 30-45 minutes a  day of aerobic activity, 2-3 days per week.  Home exercise guidelines will be given to patient during program as part of exercise prescription that the participant will acknowledge.  Education: Aerobic Exercise: - Group verbal and visual presentation on the components of exercise prescription. Introduces F.I.T.T principle from ACSM for exercise prescriptions.  Reviews F.I.T.T. principles of aerobic exercise including progression. Written material given at graduation. Flowsheet Row Cardiac Rehab from 09/02/2018 in [facility] Cardiac and Pulmonary Rehab  Date 07/24/18  Educator AS  Instruction Review Code 1- Verbalizes Understanding      Education: Resistance Exercise: - Group verbal and visual presentation on the components of exercise prescription. Introduces F.I.T.T principle from ACSM for exercise prescriptions  Reviews F.I.T.T. principles of resistance exercise including progression. Written material given at graduation.    Education: Exercise & Equipment Safety: - Individual verbal instruction and demonstration of equipment use and safety with use of the equipment. Flowsheet Row Pulmonary Rehab from 09/21/2020 in [facility] Cardiac and Pulmonary Rehab  Date 09/13/20  Educator AS  Instruction Review Code 1- Verbalizes Understanding      Education: Exercise Physiology & General Exercise Guidelines: - Group verbal and written instruction with models to review the exercise physiology of the cardiovascular system and associated critical values. Provides general exercise guidelines with specific guidelines to those with heart or lung disease.  Flowsheet Row Cardiac Rehab from 09/02/2018 in [facility] Cardiac and Pulmonary Rehab  Date 05/20/18  Educator AS  Instruction Review Code 4- No Evidence of Learning  [charting in error]      Education: Flexibility, Balance, Mind/Body  Relaxation: - Group verbal and visual presentation with interactive activity on the components of exercise prescription. Introduces  F.I.T.T principle from ACSM for exercise prescriptions. Reviews F.I.T.T. principles of flexibility and balance exercise training including progression. Also discusses the mind body connection.  Reviews various relaxation techniques to help reduce and manage stress (i.e. Deep breathing, progressive muscle relaxation, and visualization). Balance handout provided to take home. Written material given at graduation. Flowsheet Row Cardiac Rehab from 09/02/2018 in [facility] Cardiac and Pulmonary Rehab  Date 07/29/18  Educator AS  Instruction Review Code 1- Verbalizes Understanding      Activity Barriers & Risk Stratification:  Activity Barriers & Cardiac Risk Stratification - 09/12/20 1113      Activity Barriers & Cardiac Risk Stratification   Activity Barriers Shortness of Breath           6 Minute Walk:  6 Minute Walk    Row Name 09/13/20 1415         6 Minute Walk   Phase Initial     Distance 815 feet     Walk Time 5.5 minutes     # of Rest Breaks 3     MPH 1.7     METS 1.8     RPE 16     Perceived Dyspnea  3     VO2 Peak 6.2     Symptoms Yes (comment)     Comments SOB     Resting HR 78 bpm     Resting BP 140/74     Resting Oxygen Saturation  91 %     Exercise Oxygen Saturation  during 6 min walk 84 %     Max Ex. HR 104 bpm     Max Ex. BP 158/64     2 Minute Post BP 138/76           Interval HR   1 Minute HR 85     2 Minute HR 99     3 Minute HR 101     4 Minute HR 104     5 Minute HR 101     6 Minute HR 103     2 Minute Post HR 96     Interval Heart Rate? Yes           Interval Oxygen   Interval Oxygen? Yes     Baseline Oxygen Saturation % 91 %     1 Minute Oxygen Saturation % 85 %     1 Minute Liters of Oxygen 2 L     2 Minute Oxygen Saturation % 84 %     2 Minute Liters of Oxygen 2 L     3 Minute Oxygen Saturation % 84 %     3 Minute Liters of Oxygen 2 L     4 Minute Oxygen Saturation % 85 %     4 Minute Liters of Oxygen 2 L     5 Minute Oxygen Saturation %  85 %     5 Minute Liters of Oxygen 2 L     6 Minute Oxygen Saturation % 86 %     6 Minute Liters of Oxygen 2 L     2 Minute Post Oxygen Saturation % 85 %     2 Minute Post Liters of Oxygen 2 L           Oxygen Initial Assessment:  Oxygen Initial Assessment - 09/12/20 1111      Home Oxygen   Home Oxygen Device  Home Concentrator;Portable Concentrator    Sleep Oxygen Prescription Continuous    Liters per minute 2    Home Exercise Oxygen Prescription None    Home Resting Oxygen Prescription Continuous   as needed   Liters per minute 2      Intervention   Short Term Goals To learn and exhibit compliance with exercise, home and travel O2 prescription;To learn and understand importance of monitoring SPO2 with pulse oximeter and demonstrate accurate use of the pulse oximeter.;To learn and understand importance of maintaining oxygen saturations>88%;To learn and demonstrate proper pursed lip breathing techniques or other breathing techniques.;To learn and demonstrate proper use of respiratory medications    Long  Term Goals Exhibits compliance with exercise, home and travel O2 prescription;Verbalizes importance of monitoring SPO2 with pulse oximeter and return demonstration;Maintenance of O2 saturations>88%;Exhibits proper breathing techniques, such as pursed lip breathing or other method taught during program session;Compliance with respiratory medication;Demonstrates proper use of MDI's           Oxygen Re-Evaluation:  Oxygen Re-Evaluation    Row Name 09/19/20 1127 10/07/20 1113 11/02/20 1107 11/30/20 1118 12/21/20 1127     Program Oxygen Prescription   Program Oxygen Prescription -- Continuous;E-Tanks Continuous;E-Tanks Continuous;E-Tanks Continuous   Liters per minute -- 3 3 3 3      Home Oxygen   Home Oxygen Device Home Concentrator;Portable Concentrator Home Concentrator;Portable Concentrator Home Concentrator;Portable Concentrator Home Concentrator;Portable Concentrator Home  Concentrator;Portable Concentrator;E-Tanks   Sleep Oxygen Prescription Continuous Continuous Continuous Continuous Continuous   Liters per minute 2 2 2 2 2    Home Exercise Oxygen Prescription None Continuous Continuous Continuous Continuous   Liters per minute -- 2 2 2 2    Home Resting Oxygen Prescription Continuous Continuous Continuous Continuous Continuous   Liters per minute 2 2 2 2 2   as Needed   Compliance with Home Oxygen Use -- Yes No  He will use it overnight and will use it when he feels he needs it. Yes Yes     Goals/Expected Outcomes   Short Term Goals To learn and exhibit compliance with exercise, home and travel O2 prescription;To learn and understand importance of monitoring SPO2 with pulse oximeter and demonstrate accurate use of the pulse oximeter.;To learn and understand importance of maintaining oxygen saturations>88%;To learn and demonstrate proper pursed lip breathing techniques or other breathing techniques.;To learn and demonstrate proper use of respiratory medications To learn and understand importance of maintaining oxygen saturations>88%;To learn and understand importance of monitoring SPO2 with pulse oximeter and demonstrate accurate use of the pulse oximeter. To learn and understand importance of maintaining oxygen saturations>88%;To learn and understand importance of monitoring SPO2 with pulse oximeter and demonstrate accurate use of the pulse oximeter. To learn and understand importance of maintaining oxygen saturations>88%;To learn and understand importance of monitoring SPO2 with pulse oximeter and demonstrate accurate use of the pulse oximeter.;To learn and exhibit compliance with exercise, home and travel O2 prescription;To learn and demonstrate proper pursed lip breathing techniques or other breathing techniques.;To learn and demonstrate proper use of respiratory medications To learn and understand importance of monitoring SPO2 with pulse oximeter and demonstrate  accurate use of the pulse oximeter.;To learn and understand importance of maintaining oxygen saturations>88%   Long  Term Goals Exhibits compliance with exercise, home and travel O2 prescription;Verbalizes importance of monitoring SPO2 with pulse oximeter and return demonstration;Maintenance of O2 saturations>88%;Exhibits proper breathing techniques, such as pursed lip breathing or other method taught during program session;Compliance with respiratory medication;Demonstrates proper use of MDI's Maintenance  of O2 saturations>88%;Verbalizes importance of monitoring SPO2 with pulse oximeter and return demonstration Maintenance of O2 saturations>88%;Verbalizes importance of monitoring SPO2 with pulse oximeter and return demonstration Exhibits compliance with exercise, home and travel O2 prescription;Verbalizes importance of monitoring SPO2 with pulse oximeter and return demonstration;Maintenance of O2 saturations>88%;Exhibits proper breathing techniques, such as pursed lip breathing or other method taught during program session;Compliance with respiratory medication;Demonstrates proper use of MDI's Maintenance of O2 saturations>88%;Verbalizes importance of monitoring SPO2 with pulse oximeter and return demonstration   Comments Reviewed PLB technique with pt.  Talked about how it works and it's importance in maintaining their exercise saturations. He has a pulse oximeter to check his oxygen saturation at home. Informed and explained why it is important to have one. Reviewed that oxygen saturations should be 88 percent and above. Patient has a pulse oximeter at home to check his oxygen. He reports cheking O2 2-3x/day and uses his nebulizer 2x/day. His O2 is usually 80-90, expressed importance of keeping O2 over 88. Reviewed PLB. Merle is staying compliant with his oxygen and his breathing is getting better.  He continues to monitor his saturations and doing well with his breathing and using his PLB. Zoe states he is  going to use his oxygen when walking in to rehab. Informed him that his oxygen could be getting to low without it. He has a wrist band that monitors his oxygen and knows he needs to be 88 percent and above. He has no questions about his medications.   Goals/Expected Outcomes Short: Become more profiecient at using PLB.   Long: Become independent at using PLB. Short: monitor oxygen at home with exertion. Long: maintain oxygen saturations above 88 percent independently. ST: use O2 as prescribed at home LT: maintain O2 above 88 percent independently. Short: Continue to breathing better  Long; Continue to improve compliance Short: wear oxygen into rehab. Long: maintain oxygen saturations of 88 percent and above independently.          Oxygen Discharge (Final Oxygen Re-Evaluation):  Oxygen Re-Evaluation - 12/21/20 1127      Program Oxygen Prescription   Program Oxygen Prescription Continuous    Liters per minute 3      Home Oxygen   Home Oxygen Device Home Concentrator;Portable Concentrator;E-Tanks    Sleep Oxygen Prescription Continuous    Liters per minute 2    Home Exercise Oxygen Prescription Continuous    Liters per minute 2    Home Resting Oxygen Prescription Continuous    Liters per minute 2   as Needed   Compliance with Home Oxygen Use Yes      Goals/Expected Outcomes   Short Term Goals To learn and understand importance of monitoring SPO2 with pulse oximeter and demonstrate accurate use of the pulse oximeter.;To learn and understand importance of maintaining oxygen saturations>88%    Long  Term Goals Maintenance of O2 saturations>88%;Verbalizes importance of monitoring SPO2 with pulse oximeter and return demonstration    Comments Becket states he is going to use his oxygen when walking in to rehab. Informed him that his oxygen could be getting to low without it. He has a wrist band that monitors his oxygen and knows he needs to be 88 percent and above. He has no questions about his  medications.    Goals/Expected Outcomes Short: wear oxygen into rehab. Long: maintain oxygen saturations of 88 percent and above independently.           Initial Exercise Prescription:  Initial Exercise Prescription - 09/13/20 1400  Date of Initial Exercise RX and Referring Provider   Date 09/13/20    Referring Provider Jayme Cloud      Recumbant Bike   Level 1    RPM 60    Minutes 15    METs 1.8      NuStep   Level 1    SPM 80    Minutes 15    METs 1.8      Arm Ergometer   Level 1    RPM 30    Minutes 15    METs 1.8      Prescription Details   Frequency (times per week) 3    Duration Progress to 30 minutes of continuous aerobic without signs/symptoms of physical distress      Intensity   THRR 40-80% of Max Heartrate 104-131    Ratings of Perceived Exertion 11-13    Perceived Dyspnea 0-4      Resistance Training   Training Prescription Yes    Weight 3 lb    Reps 10-15           Perform Capillary Blood Glucose checks as needed.  Exercise Prescription Changes:  Exercise Prescription Changes    Row Name 09/13/20 1400 09/22/20 0700 10/04/20 0800 10/19/20 0800 10/31/20 1500     Response to Exercise   Blood Pressure (Admit) 140/74 132/68 154/68 160/80 142/70   Blood Pressure (Exercise) 158/64 142/70 176/70 144/80 150/64   Blood Pressure (Exit) 138/76 132/64 144/74 130/80 122/58   Heart Rate (Admit) 78 bpm 83 bpm 79 bpm 80 bpm 82 bpm   Heart Rate (Exercise) 104 bpm 91 bpm 88 bpm 92 bpm 86 bpm   Heart Rate (Exit) 96 bpm 79 bpm 80 bpm 82 bpm 78 bpm   Oxygen Saturation (Admit) 91 % 83 % 91 % 88 % 92 %   Oxygen Saturation (Exercise) 84 % 89 % 92 % 92 % 88 %   Oxygen Saturation (Exit) 85 % 92 % 95 % 92 % 97 %   Rating of Perceived Exertion (Exercise) 16 13 11 13 11    Perceived Dyspnea (Exercise) -- 1 1 0 2   Symptoms SOB -- SOB -- none   Duration -- Continue with 30 min of aerobic exercise without signs/symptoms of physical distress. Continue with 30 min of  aerobic exercise without signs/symptoms of physical distress. Continue with 30 min of aerobic exercise without signs/symptoms of physical distress. Continue with 30 min of aerobic exercise without signs/symptoms of physical distress.   Intensity -- THRR unchanged THRR unchanged THRR unchanged THRR unchanged     Progression   Progression -- Continue to progress workloads to maintain intensity without signs/symptoms of physical distress. Continue to progress workloads to maintain intensity without signs/symptoms of physical distress. Continue to progress workloads to maintain intensity without signs/symptoms of physical distress. Continue to progress workloads to maintain intensity without signs/symptoms of physical distress.   Average METs -- 2.2 2.3 2.5 2.8     Resistance Training   Training Prescription -- Yes Yes Yes Yes   Weight -- 5 lb 5 lb 5 lb 5 lb   Reps -- 10-15 10-15 10-15 10-15     Interval Training   Interval Training -- -- No No No     Oxygen   Oxygen -- -- Continuous Continuous Continuous   Liters -- -- 3 3 3      NuStep   Level -- 1 6 6 6    SPM -- 80 -- 80 --   Minutes --  15 30 15 15    METs -- 2.2 2.3 2.5 2.8   Row Name 11/14/20 1600 11/30/20 1400 12/14/20 1000 12/26/20 1100       Response to Exercise   Blood Pressure (Admit) 138/70 118/60 140/64 142/80    Blood Pressure (Exercise) 142/76 130/70 138/58 138/64    Blood Pressure (Exit) 128/58 118/58 122/58 126/70    Heart Rate (Admit) 83 bpm 100 bpm 74 bpm 78 bpm    Heart Rate (Exercise) 89 bpm 888 bpm 87 bpm 78 bpm    Heart Rate (Exit) 87 bpm 81 bpm 78 bpm 64 bpm    Oxygen Saturation (Admit) 94 % 90 % 89 % 88 %    Oxygen Saturation (Exercise) 93 % 92 % 87 % 88 %    Oxygen Saturation (Exit) 92 % 95 % 92 % 98 %    Rating of Perceived Exertion (Exercise) 11 13 11 11     Perceived Dyspnea (Exercise) -- 2 0 1    Symptoms none SOB -- SOB    Duration Continue with 30 min of aerobic exercise without signs/symptoms of  physical distress. Continue with 30 min of aerobic exercise without signs/symptoms of physical distress. Continue with 30 min of aerobic exercise without signs/symptoms of physical distress. Continue with 30 min of aerobic exercise without signs/symptoms of physical distress.    Intensity THRR unchanged THRR unchanged THRR unchanged THRR unchanged         Progression   Progression Continue to progress workloads to maintain intensity without signs/symptoms of physical distress. Continue to progress workloads to maintain intensity without signs/symptoms of physical distress. Continue to progress workloads to maintain intensity without signs/symptoms of physical distress. Continue to progress workloads to maintain intensity without signs/symptoms of physical distress.    Average METs 2.15 2.4 1.9 2.2         Resistance Training   Training Prescription Yes Yes Yes Yes    Weight 5 lb 5 lb 5 lb 5 lb    Reps 10-15 10-15 10-15 10-15         Interval Training   Interval Training No No No No         Oxygen   Oxygen Continuous Continuous Continuous Continuous    Liters 3 3 3 6   bad breathing day         NuStep   Level 4 6 6 4     SPM 80 -- -- --    Minutes 15 15 15 30     METs 2.15 2.4 1.9 2.2         Home Exercise Plan   Plans to continue exercise at -- Home (comment)  bike, walking Home (comment)  bike, walking Home (comment)  bike, walking    Frequency -- Add 2 additional days to program exercise sessions. Add 2 additional days to program exercise sessions. Add 2 additional days to program exercise sessions.    Initial Home Exercises Provided -- 11/02/20 11/02/20 11/02/20           Exercise Comments:   Exercise Goals and Review:  Exercise Goals    Row Name 09/13/20 1421             Exercise Goals   Increase Physical Activity Yes       Intervention Provide advice, education, support and counseling about physical activity/exercise needs.;Develop an individualized exercise  prescription for aerobic and resistive training based on initial evaluation findings, risk stratification, comorbidities and participant's personal goals.  Expected Outcomes Short Term: Attend rehab on a regular basis to increase amount of physical activity.;Long Term: Add in home exercise to make exercise part of routine and to increase amount of physical activity.;Long Term: Exercising regularly at least 3-5 days a week.       Increase Strength and Stamina Yes       Intervention Provide advice, education, support and counseling about physical activity/exercise needs.;Develop an individualized exercise prescription for aerobic and resistive training based on initial evaluation findings, risk stratification, comorbidities and participant's personal goals.       Expected Outcomes Short Term: Increase workloads from initial exercise prescription for resistance, speed, and METs.;Short Term: Perform resistance training exercises routinely during rehab and add in resistance training at home;Long Term: Improve cardiorespiratory fitness, muscular endurance and strength as measured by increased METs and functional capacity ( )       Able to understand and use rate of perceived exertion (RPE) scale Yes       Intervention Provide education and explanation on how to use RPE scale       Expected Outcomes Short Term: Able to use RPE daily in rehab to express subjective intensity level;Long Term:  Able to use RPE to guide intensity level when exercising independently       Able to understand and use Dyspnea scale Yes       Intervention Provide education and explanation on how to use Dyspnea scale       Expected Outcomes Short Term: Able to use Dyspnea scale daily in rehab to express subjective sense of shortness of breath during exertion;Long Term: Able to use Dyspnea scale to guide intensity level when exercising independently       Knowledge and understanding of Target Heart Rate Range (THRR) Yes        Intervention Provide education and explanation of THRR including how the numbers were predicted and where they are located for reference       Expected Outcomes Short Term: Able to state/look up THRR;Short Term: Able to use daily as guideline for intensity in rehab;Long Term: Able to use THRR to govern intensity when exercising independently       Able to check pulse independently Yes       Intervention Provide education and demonstration on how to check pulse in carotid and radial arteries.;Review the importance of being able to check your own pulse for safety during independent exercise       Expected Outcomes Short Term: Able to explain why pulse checking is important during independent exercise;Long Term: Able to check pulse independently and accurately       Understanding of Exercise Prescription Yes       Intervention Provide education, explanation, and written materials on patient's individual exercise prescription       Expected Outcomes Short Term: Able to explain program exercise prescription;Long Term: Able to explain home exercise prescription to exercise independently              Exercise Goals Re-Evaluation :  Exercise Goals Re-Evaluation    Row Name 09/19/20 1126 09/22/20 0734 10/04/20 0854 10/07/20 1123 10/19/20 0826     Exercise Goal Re-Evaluation   Exercise Goals Review Increase Physical Activity;Able to understand and use rate of perceived exertion (RPE) scale;Knowledge and understanding of Target Heart Rate Range (THRR);Understanding of Exercise Prescription;Increase Strength and Stamina;Able to understand and use Dyspnea scale;Able to check pulse independently Increase Physical Activity;Increase Strength and Stamina Increase Physical Activity;Increase Strength and Stamina;Understanding of Exercise Prescription Understanding  of Exercise Prescription Increase Physical Activity;Increase Strength and Stamina   Comments Reviewed RPE and dyspnea scales, THR and program prescription  with pt today.  Pt voiced understanding and was given a copy of goals to take home. Dastan is off to a good start in [facility].  Staff will monitor progress. Jaecob has requested to just use the T4 NuStep as he does not like the other machines, they feel uncomfortable. He does alternate between using both arms and legs, just legs, and just arms.  He also has difficulty with his mask,but does better with the exercise mask.  We will continue to montior his progress on the NuStep. Raph states that he is interested in the [place] once he is done with the program. He is going to look into his insurance to see if he has [employer]. Informed him that the gym has oxygen tanks and would be good to continue his exercise. Vaishnav has been attending twice per week most weeks.  Staff will review home exercise so he can add 1-2 days at home for better progress.  He has moved up to level 6 on NS.   Expected Outcomes Short: Use RPE daily to regulate intensity. Long: Follow program prescription in THR. Short:  attend consistently Long:  improve stamina Short: Maintain spm consistently Long; Conitnue to improve stamina. Short: check with insurance if he has [employer]. Long: maintain an exercise regimine post [facility]. Short: review home exercise Long: exercise 3-5 days per week consistently   Row Name 10/31/20 1517 11/02/20 1141 11/14/20 1642 11/30/20 1112 12/14/20 1006     Exercise Goal Re-Evaluation   Exercise Goals Review Increase Physical Activity;Increase Strength and Stamina;Understanding of Exercise Prescription Increase Physical Activity;Increase Strength and Stamina;Understanding of Exercise Prescription Increase Physical Activity;Increase Strength and Stamina;Understanding of Exercise Prescription Increase Physical Activity;Increase Strength and Stamina;Understanding of Exercise Prescription Increase Physical Activity;Increase Strength and Stamina   Comments Only attended once since last review. Rayaan  continues to just use the same station as it does not hurt him at all.  We will continue to montior his progress. Reviewed home exercise with pt today.  Pt plans to walk/bike, may go to [facility] for exercise.  Reviewed THR, pulse, RPE, sign and symptoms, pulse oximetery and when to call 911 or MD.  Also discussed weather considerations and indoor options.  Pt voiced understanding. Riku lowered level on T4 today.  Staff will continue to monitor progress. Loden is doing well in rehab.  He is walking on his off days.  He is feeling stronger overall and better. Cleven has moved up to level 6 on NS.  More consistent attendance would help him progress more.   Expected Outcomes Short: Return to regular attendance  Long: Continue to exercise regularly. -- Short: get back up to previous levels Long: maintain consistent exercise Short: Continue to stay active  Long: Continue to improve stamina. Short:  attend at least twice per week Long: improve overall stamina   Row Name 12/26/20 1116 01/23/21 1003 02/15/21 1514 02/22/21 1026       Exercise Goal Re-Evaluation   Exercise Goals Review Increase Physical Activity;Increase Strength and Stamina -- -- --    Comments Revin stated that he feels like he is making progress with his exercise and is getting stronger. He said he works at his own pace and feels like he is doing well. HR and SaO2 are maintained in acceptable ranges duing exercise. Currently admitted and out since last review Out since last review.  Currently home on medical hold for 2 weeks Out since last review, supposed to return next week.    Expected Outcomes Short: continue to attend pulmonary rehab at least 2 days a week. Long: Plans to exercise outside and be more active once the temperature increases which is easier on his breathing. -- -- --           Discharge Exercise Prescription (Final Exercise Prescription Changes):  Exercise Prescription Changes - 12/26/20 1100      Response to Exercise    Blood Pressure (Admit) 142/80    Blood Pressure (Exercise) 138/64    Blood Pressure (Exit) 126/70    Heart Rate (Admit) 78 bpm    Heart Rate (Exercise) 78 bpm    Heart Rate (Exit) 64 bpm    Oxygen Saturation (Admit) 88 %    Oxygen Saturation (Exercise) 88 %    Oxygen Saturation (Exit) 98 %    Rating of Perceived Exertion (Exercise) 11    Perceived Dyspnea (Exercise) 1    Symptoms SOB    Duration Continue with 30 min of aerobic exercise without signs/symptoms of physical distress.    Intensity THRR unchanged      Progression   Progression Continue to progress workloads to maintain intensity without signs/symptoms of physical distress.    Average METs 2.2      Resistance Training   Training Prescription Yes    Weight 5 lb    Reps 10-15      Interval Training   Interval Training No      Oxygen   Oxygen Continuous    Liters 6   bad breathing day     NuStep   Level 4    Minutes 30    METs 2.2      Home Exercise Plan   Plans to continue exercise at Home (comment)   bike, walking   Frequency Add 2 additional days to program exercise sessions.    Initial Home Exercises Provided 11/02/20           Nutrition:  Target Goals: Understanding of nutrition guidelines, daily intake of sodium 1500mg , cholesterol 200mg , calories 30% from fat and 7% or less from saturated fats, daily to have 5 or more servings of fruits and vegetables.  Education: All About Nutrition: -Group instruction provided by verbal, written material, interactive activities, discussions, models, and posters to present general guidelines for heart healthy nutrition including fat, fiber, MyPlate, the role of sodium in heart healthy nutrition, utilization of the nutrition label, and utilization of this knowledge for meal planning. Follow up email sent as well. Written material given at graduation. Flowsheet Row Cardiac Rehab from 09/02/2018 in [facility] Cardiac and Pulmonary Rehab  Date 08/26/18  Educator [facility]   Instruction Review Code 1- Verbalizes Understanding      Biometrics:  Pre Biometrics - 09/13/20 1422      Pre Biometrics   Height 5\' 5"  (1.651 m)    Weight 182 lb 14.4 oz (83 kg)    BMI (Calculated) 30.44    Single Leg Stand 10 seconds            Nutrition Therapy Plan and Nutrition Goals:  Nutrition Therapy & Goals - 11/02/20 1124      Personal Nutrition Goals   Nutrition Goal No goals at this time.    Comments Got two more books on DM at the book store this weekend. B; cereal - cherrios, rice krispies, and oatmeal, eggs and bacon, tail of ham - egss  and cheese on a roll. L: sometimes will skip D: loves pasta. He eats salads, chicken, steak, escarole and beans. He doesn't care for poatoes aside from hashbrowns. S: cookies - sleep time tea - he will also have protein drinks with his coffee in the afternoon. He reports using stevia and limiting simple sugars. He reports being through education before and not wanting to make any changes at this time.           Nutrition Assessments:  Nutrition Assessments - 09/13/20 1425      MEDFICTS Scores   Pre Score 65          MEDIFICTS Score Key:  ?70 Need to make dietary changes   40-70 Heart Healthy Diet  ? 40 Therapeutic Level Cholesterol Diet   Picture Your Plate Scores:  <16 Unhealthy dietary pattern with much room for improvement.  41-50 Dietary pattern unlikely to meet recommendations for good health and room for improvement.  51-60 More healthful dietary pattern, with some room for improvement.   >60 Healthy dietary pattern, although there may be some specific behaviors that could be improved.   Nutrition Goals Re-Evaluation:  Nutrition Goals Re-Evaluation    Row Name 11/30/20 1117 12/28/20 1144           Goals   Nutrition Goal Eat healthier Pt would not like to make goals at this time      Comment Azad has gotten better about his diet and is really trying to be good through holidays.  He is feeling  better about it.Rayna Sexton continues to have no questions regarding nutritoin and would not like to make any changes. He reports eating a mostly healthy diet.      Expected Outcome Short: Continue to focus on heart healthy Long; Continue to work on weight loss Pt would not like to make goals at this time             Nutrition Goals Discharge (Final Nutrition Goals Re-Evaluation):  Nutrition Goals Re-Evaluation - 12/28/20 1144      Goals   Nutrition Goal Pt would not like to make goals at this time    Comment Odies continues to have no questions regarding nutritoin and would not like to make any changes. He reports eating a mostly healthy diet.    Expected Outcome Pt would not like to make goals at this time           Psychosocial: Target Goals: Acknowledge presence or absence of significant depression and/or stress, maximize coping skills, provide positive support system. Participant is able to verbalize types and ability to use techniques and skills needed for reducing stress and depression.   Education: Stress, Anxiety, and Depression - Group verbal and visual presentation to define topics covered.  Reviews how body is impacted by stress, anxiety, and depression.  Also discusses healthy ways to reduce stress and to treat/manage anxiety and depression.  Written material given at graduation. Flowsheet Row Pulmonary Rehab from 09/21/2020 in [facility] Cardiac and Pulmonary Rehab  Date 09/21/20  Educator [facility]  Instruction Review Code 1- [employer] Understanding      Education: Sleep Hygiene -Provides group verbal and written instruction about how sleep can affect your health.  Define sleep hygiene, discuss sleep cycles and impact of sleep habits. Review good sleep hygiene tips.  Flowsheet Row Cardiac Rehab from 09/02/2018 in [facility] Cardiac and Pulmonary Rehab  Date 08/19/18  Educator [facility]  Instruction Review Code 1- Verbalizes Understanding  [Arrived late to class]  Initial Review &  Psychosocial Screening:  Initial Psych Review & Screening - 09/12/20 1116      Initial Review   Current issues with Current Anxiety/Panic      Family Dynamics   Good Support System? Yes   Johnnye SimaFiancee Carol, 6 years, family     Barriers   Psychosocial barriers to participate in program There are no identifiable barriers or psychosocial needs.;The patient should benefit from training in stress management and relaxation.;Psychosocial barriers identified (see note)      Screening Interventions   Interventions Encouraged to exercise    Expected Outcomes Short Term goal: Utilizing psychosocial counselor, staff and physician to assist with identification of specific Stressors or current issues interfering with healing process. Setting desired goal for each stressor or current issue identified.;Long Term Goal: Stressors or current issues are controlled or eliminated.;Short Term goal: Identification and review with participant of any Quality of Life or Depression concerns found by scoring the questionnaire.;Long Term goal: The participant improves quality of Life and PHQ9 Scores as seen by post scores and/or verbalization of changes           Quality of Life Scores:  Scores of 19 and below usually indicate a poorer quality of life in these areas.  A difference of  2-3 points is a clinically meaningful difference.  A difference of 2-3 points in the total score of the Quality of Life Index has been associated with significant improvement in overall quality of life, self-image, physical symptoms, and general health in studies assessing change in quality of life.  PHQ-9: Recent Review Flowsheet Data    [facility] 09/13/2020 08/26/2018 05/01/2018   Decreased Interest 0 0 0   Down, Depressed, Hopeless 0 0 0   PHQ - 2 Score 0 0 0   Altered sleeping 0 0 0   Tired, decreased energy 1 1 0   Change in appetite 0 - 0   Feeling bad or failure about yourself  0 0 0   Trouble concentrating 0 0 0    Moving slowly or fidgety/restless 0 0 0   Suicidal thoughts 0 0 0   PHQ-9 Score 1 1 0   Difficult doing work/chores Not difficult at all Not difficult at all Not difficult at all     Interpretation of Total Score  Total Score Depression Severity:  1-4 = Minimal depression, 5-9 = Mild depression, 10-14 = Moderate depression, 15-19 = Moderately severe depression, 20-27 = Severe depression   Psychosocial Evaluation and Intervention:  Psychosocial Evaluation - 09/12/20 1136      Psychosocial Evaluation & Interventions   Comments Rayna SextonRalph doe not have any barriers to attending the program.  He remembers attending Cardiac Rehab several years ago and is looking forward to starting Pulmonary Rehab. He does get anxiety at time when his breathing is hard. He has learned to slow down, and reset to help get his breathing back to his normal. He lives with his Julio Sicksfinacee Carol and their 2 dogs. He has a great support system with Okey RegalCarol and his family. He continues to be active, traveling and getting out his bike. He has purchased his own portable oxygen concentrator for at home and traveling. He should do great in the program.    Expected Outcomes STG: Rayna SextonRalph will attend all scheduled sessions to gain the most from the program. LTG: Rayna SextonRalph will see improvement in his ADL activity and be able to maintain this improvement after discharge.    Continue Psychosocial Services  Follow up required by staff           Psychosocial Re-Evaluation:  Psychosocial Re-Evaluation    Row Name 10/07/20 1118 11/02/20 1111 11/30/20 1114 12/26/20 1134       Psychosocial Re-Evaluation   Current issues with Current Stress Concerns;Current Anxiety/Panic Current Anxiety/Panic Current Anxiety/Panic;Current Stress Concerns Current Anxiety/Panic;Current Stress Concerns    Comments Gottlieb gets anxious when he gets short of breath. His breathing is the only issue that gets him down. He states that his mind is sharp and is willing to get  healthier to breath easier. He reports he is on xanax for anxiety for 7 years and reports it's helping. He reports getting overwhelmed sometimes with anxiety when things don't go right, gotten woirse since his wife passed 7 years ago. He will ride his motorcycle and watches TV to reduce stress and anxiety, he has techniques where he reduces his panic - ice cold water. Support system in girlfriend who he lives with and chicldren who are in [place]. Tayshun is doing well with exercise.  He is frustrated about not seeing his kids and grandkids for Christmas, but they do have plans to go up in January.  He is then just worried about the cost of gas and travel.  He is planning to ride his motorcycle this weekend.  He is sleeping well and breathing better with the cooler air.  His anxiety has been up a little with his frustrations, but overall doing well on his xanax. Jermaine reported no major changes in his anxiety. He does continue to take his anxiety medication and works with his breathing when he does become anxious. He does have a partner that he talks to for support and tries not to think to far ahead about things that he knows will make his anxious. He stated he wants to call Dr. Jayme Cloud this week to talk about his breathing difficulties when he does have anxiety attacks.    Expected Outcomes Short: continue [facility] to improve shortness of breath. Long: maintain exercise to keep stress at a minimum. Short: continue [facility] to improve shortness of breath. Long: maintain exercise to keep stress at a minimum, utilize support system, keep using relaxing activities and techniques. Short: Look forward to traveling after Christmas.  Long: Continue to manage activities. Short: Call doctor to discuss anxiety concerns. Long: continue to work on managing anxiety and maintaining good mental health.    Interventions Encouraged to attend Pulmonary Rehabilitation for the exercise Encouraged to attend Pulmonary Rehabilitation  for the exercise Encouraged to attend Pulmonary Rehabilitation for the exercise Encouraged to attend Pulmonary Rehabilitation for the exercise    Continue Psychosocial Services  Follow up required by staff Follow up required by staff Follow up required by staff Follow up required by staff         Initial Review   Source of Stress Concerns -- Chronic Illness -- --           Psychosocial Discharge (Final Psychosocial Re-Evaluation):  Psychosocial Re-Evaluation - 12/26/20 1134      Psychosocial Re-Evaluation   Current issues with Current Anxiety/Panic;Current Stress Concerns    Comments Trejan reported no major changes in his anxiety. He does continue to take his anxiety medication and works with his breathing when he does become anxious. He does have a partner that he talks to for support and tries not to think to far ahead about things that he knows will make his anxious. He stated he wants to call Dr.  Jayme Cloud this week to talk about his breathing difficulties when he does have anxiety attacks.    Expected Outcomes Short: Call doctor to discuss anxiety concerns. Long: continue to work on managing anxiety and maintaining good mental health.    Interventions Encouraged to attend Pulmonary Rehabilitation for the exercise    Continue Psychosocial Services  Follow up required by staff           Education: Education Goals: Education classes will be provided on a weekly basis, covering required topics. Participant will state understanding/return demonstration of topics presented.  Learning Barriers/Preferences:  Learning Barriers/Preferences - 09/12/20 1121      Learning Barriers/Preferences   Learning Barriers None    Learning Preferences None           General Pulmonary Education Topics:  Infection Prevention: - Provides verbal and written material to individual with discussion of infection control including proper hand washing and proper equipment cleaning during exercise  session. Flowsheet Row Pulmonary Rehab from 09/21/2020 in [facility] Cardiac and Pulmonary Rehab  Date 09/13/20  Educator AS  Instruction Review Code 1- Verbalizes Understanding      Falls Prevention: - Provides verbal and written material to individual with discussion of falls prevention and safety. Flowsheet Row Pulmonary Rehab from 09/21/2020 in [facility] Cardiac and Pulmonary Rehab  Date 09/13/20  Educator AS  Instruction Review Code 1- Verbalizes Understanding      Chronic Lung Disease Review: - Group verbal instruction with posters, models, PowerPoint presentations and videos,  to review new updates, new respiratory medications, new advancements in procedures and treatments. Providing information on websites and "800" numbers for continued self-education. Includes information about supplement oxygen, available portable oxygen systems, continuous and intermittent flow rates, oxygen safety, concentrators, and Medicare reimbursement for oxygen. Explanation of Pulmonary Drugs, including class, frequency, complications, importance of spacers, rinsing mouth after steroid MDI's, and proper cleaning methods for nebulizers. Review of basic lung anatomy and physiology related to function, structure, and complications of lung disease. Review of risk factors. Discussion about methods for diagnosing sleep apnea and types of masks and machines for OSA. Includes a review of the use of types of environmental controls: home humidity, furnaces, filters, dust mite/pet prevention, HEPA vacuums. Discussion about weather changes, air quality and the benefits of nasal washing. Instruction on Warning signs, infection symptoms, calling MD promptly, preventive modes, and value of vaccinations. Review of effective airway clearance, coughing and/or vibration techniques. Emphasizing that all should Create an Action Plan. Written material given at graduation.   AED/CPR: - Group verbal and written instruction with the use of models  to demonstrate the basic use of the AED with the basic ABC's of resuscitation. Flowsheet Row Cardiac Rehab from 09/02/2018 in [facility] Cardiac and Pulmonary Rehab  Date 09/02/18  Educator [place]  Instruction Review Code 1- Verbalizes Understanding       Anatomy and Cardiac Procedures: - Group verbal and visual presentation and models provide information about basic cardiac anatomy and function. Reviews the testing methods done to diagnose heart disease and the outcomes of the test results. Describes the treatment choices: Medical Management, Angioplasty, or Coronary Bypass Surgery for treating various heart conditions including Myocardial Infarction, Angina, Valve Disease, and Cardiac Arrhythmias.  Written material given at graduation. Flowsheet Row Cardiac Rehab from 09/02/2018 in [facility] Cardiac and Pulmonary Rehab  Date 08/07/18  Educator CE  Instruction Review Code 1- Verbalizes Understanding      Medication Safety: - Group verbal and visual instruction to review commonly prescribed medications for  heart and lung disease. Reviews the medication, class of the drug, and side effects. Includes the steps to properly store meds and maintain the prescription regimen.  Written material given at graduation. Flowsheet Row Cardiac Rehab from 09/02/2018 in [facility] Cardiac and Pulmonary Rehab  Date 08/12/18  Educator SB  Instruction Review Code 1- Verbalizes Understanding      Other: -Provides group and verbal instruction on various topics (see comments)   Knowledge Questionnaire Score:  Knowledge Questionnaire Score - 09/13/20 1424      Knowledge Questionnaire Score   Pre Score 16/18            Core Components/Risk Factors/Patient Goals at Admission:  Personal Goals and Risk Factors at Admission - 09/13/20 1426      Core Components/Risk Factors/Patient Goals on Admission    Weight Management Yes;Weight Loss    Intervention Weight Management: Develop a combined nutrition and exercise program  designed to reach desired caloric intake, while maintaining appropriate intake of nutrient and fiber, sodium and fats, and appropriate energy expenditure required for the weight goal.    Admit Weight 182 lb 14.4 oz (83 kg)    Goal Weight: Short Term 180 lb (81.6 kg)    Goal Weight: Long Term 175 lb (79.4 kg)    Expected Outcomes Short Term: Continue to assess and modify interventions until short term weight is achieved;Long Term: Adherence to nutrition and physical activity/exercise program aimed toward attainment of established weight goal;Weight Loss: Understanding of general recommendations for a balanced deficit meal plan, which promotes 1-2 lb weight loss per week and includes a negative energy balance of [phone] kcal/d    Diabetes Yes    Intervention Provide education about signs/symptoms and action to take for hypo/hyperglycemia.;Provide education about proper nutrition, including hydration, and aerobic/resistive exercise prescription along with prescribed medications to achieve blood glucose in normal ranges: Fasting glucose 65-99 mg/dL    Expected Outcomes Short Term: Participant verbalizes understanding of the signs/symptoms and immediate care of hyper/hypoglycemia, proper foot care and importance of medication, aerobic/resistive exercise and nutrition plan for blood glucose control.;Long Term: Attainment of HbA1C < 7%.    Hypertension Yes    Intervention Provide education on lifestyle modifcations including regular physical activity/exercise, weight management, moderate sodium restriction and increased consumption of fresh fruit, vegetables, and low fat dairy, alcohol moderation, and smoking cessation.;Monitor prescription use compliance.    Expected Outcomes Short Term: Continued assessment and intervention until BP is < 140/12mm HG in hypertensive participants. < 130/38mm HG in hypertensive participants with diabetes, heart failure or chronic kidney disease.;Long Term: Maintenance of blood  pressure at goal levels.    Lipids Yes    Intervention Provide education and support for participant on nutrition & aerobic/resistive exercise along with prescribed medications to achieve LDL 70mg , HDL >40mg .    Expected Outcomes Short Term: Participant states understanding of desired cholesterol values and is compliant with medications prescribed. Participant is following exercise prescription and nutrition guidelines.;Long Term: Cholesterol controlled with medications as prescribed, with individualized exercise RX and with personalized nutrition plan. Value goals: LDL < 70mg , HDL > 40 mg.    Personal Goal Other Yes           Education:Diabetes - Individual verbal and written instruction to review signs/symptoms of diabetes, desired ranges of glucose level fasting, after meals and with exercise. Acknowledge that pre and post exercise glucose checks will be done for 3 sessions at entry of program. Flowsheet Row Cardiac Rehab from 09/02/2018 in [facility] Cardiac and Pulmonary  Rehab  Date 05/01/18  Educator MC  Instruction Review Code 1- Verbalizes Understanding      Know Your Numbers and Heart Failure: - Group verbal and visual instruction to discuss disease risk factors for cardiac and pulmonary disease and treatment options.  Reviews associated critical values for Overweight/Obesity, Hypertension, Cholesterol, and Diabetes.  Discusses basics of heart failure: signs/symptoms and treatments.  Introduces Heart Failure Zone chart for action plan for heart failure.  Written material given at graduation. Flowsheet Row Cardiac Rehab from 09/02/2018 in [facility] Cardiac and Pulmonary Rehab  Date 08/07/18  Educator CE  Instruction Review Code 1- Verbalizes Understanding      Core Components/Risk Factors/Patient Goals Review:   Goals and Risk Factor Review    Row Name 10/07/20 1116 11/02/20 1120 11/30/20 1121 12/26/20 1123       Core Components/Risk Factors/Patient Goals Review   Personal Goals Review  Improve shortness of breath with ADL's Improve shortness of breath with ADL's;Diabetes Improve shortness of breath with ADL's;Diabetes;Weight Management/Obesity;Hypertension Improve shortness of breath with ADL's;Diabetes;Weight Management/Obesity;Hypertension    Review If he is doing chores at home and if things are too strennuous he get anxious and short of breath.Spoke to patient about their shortness of breath and what they can do to improve. Patient has been informed of breathing techniques when starting the program. Patient is informed to tell staff if they have had any med changes and that certain meds they are taking or not taking can be causing shortness of breath. This weekend BG got up to 400 after syrup and pineapple juice - took 2 metformin and it went down to 40. Feeling better now. BG in am usually 125. Educated on hypo and hyperglycemia - how to identify and how to normalize BG. He reports doing better with ADLs at home - will get worse if he moves too quickly or carries things that are too heavy. Mahki is officially two years out from qutting smoking.  His breathing is getting better.  His sugars have been doing well overall.  His pressures continue to do well for him. Shaquelle reports taking all medications and monitoring blood sugars at home. His weight has been steady and he manages his SOB with O2 as needed depending on temperature and air pressure.    Expected Outcomes Short: Attend [facility] regularly to improve shortness of breath with ADL's. Long: maintain independence with ADL's Short: Attend [facility] regularly to improve shortness of breath with ADL's, use O2 as prescribed, continue with management of BG (limiting simple sugars) Long: maintain independence with ADL's Short: Continue to work on breathing  Long: Continue to manage risk factors. Short: continue to work on purse lip breathing to manage SOB and continue to take all medications as prescribed. Long: Continue heart healthy  lifestyle to manage cardiac risk factors.           Core Components/Risk Factors/Patient Goals at Discharge (Final Review):   Goals and Risk Factor Review - 12/26/20 1123      Core Components/Risk Factors/Patient Goals Review   Personal Goals Review Improve shortness of breath with ADL's;Diabetes;Weight Management/Obesity;Hypertension    Review Ian reports taking all medications and monitoring blood sugars at home. His weight has been steady and he manages his SOB with O2 as needed depending on temperature and air pressure.    Expected Outcomes Short: continue to work on purse lip breathing to manage SOB and continue to take all medications as prescribed. Long: Continue heart healthy lifestyle to manage cardiac risk factors.  ITP Comments:  ITP Comments    Row Name 09/12/20 1129 09/13/20 1438 09/19/20 1125 09/21/20 0618 10/19/20 0717   ITP Comments Virtual orientation call completed today. he has an appointment on Date: [id number] for EP eval and gym Orientation.  Documentation of diagnosis can be found in [facility] Date: 04/14/2020. Completed and gym orientation. Initial ITP created and sent for review to Dr. Bethann Punches, Medical Director. First full day of exercise!  Patient was oriented to gym and equipment including functions, settings, policies, and procedures.  Patient's individual exercise prescription and treatment plan were reviewed.  All starting workloads were established based on the results of the 6 minute walk test done at initial orientation visit.  The plan for exercise progression was also introduced and progression will be customized based on patient's performance and goals. 30 Day review completed. Medical Director ITP review done, changes made as directed, and signed approval by Medical Director. 30 Day review completed. Medical Director ITP review done, changes made as directed, and signed approval by Medical Director.   Row Name 11/16/20 1043 12/14/20 1191 01/11/21  0945 01/11/21 1202 01/23/21 1002   ITP Comments 30 Day review completed. Medical Director ITP review done, changes made as directed, and signed approval by Medical Director. 30 Day review completed. Medical Director ITP review done, changes made as directed, and signed approval by Medical Director. 30 Day review completed. Medical Director ITP review done, changes made as directed, and signed approval by Medical Director. Tarance has been out with sick - diagnosed with COVID 01/04/21. Durk has been admitted to the hospital with COVID pneumonia and respitory failure due to COVID. Still admitted with potential d/c today.   Row Name 02/08/21 0704 02/08/21 1356 02/15/21 1514 02/22/21 1025 03/08/21 1013   ITP Comments 30 Day review completed. Medical Director ITP review done, changes made as directed, and signed approval by Medical Director. Thad has not attended since last review.  He is currently back in the hospital for continued respiratory issues. Called to check on pt.  He has been in hospital in and out with COVID and respiratory failure.  He is still feeling pretty out of it and very fatigue.  He is staying out of the public for right now. He is not quite ready to come back yet and wants to hold out for two weeks. He did sign a DNR yesterday. Drue is supposed to return next week. 30 Day review completed. Medical Director ITP review done, changes made as directed, and signed approval by Medical Director.  Has not returned yet          Comments:

## 2021-03-20 ENCOUNTER — Ambulatory Visit (INDEPENDENT_AMBULATORY_CARE_PROVIDER_SITE_OTHER): Payer: Medicare Other | Admitting: Pulmonary Disease

## 2021-03-20 ENCOUNTER — Encounter: Payer: Self-pay | Admitting: Pulmonary Disease

## 2021-03-20 ENCOUNTER — Other Ambulatory Visit: Payer: Self-pay

## 2021-03-20 VITALS — BP 126/78 | HR 71 | Temp 97.0°F | Ht 63.0 in | Wt 174.2 lb

## 2021-03-20 DIAGNOSIS — I272 Pulmonary hypertension, unspecified: Secondary | ICD-10-CM | POA: Diagnosis not present

## 2021-03-20 DIAGNOSIS — J449 Chronic obstructive pulmonary disease, unspecified: Secondary | ICD-10-CM

## 2021-03-20 DIAGNOSIS — J9611 Chronic respiratory failure with hypoxia: Secondary | ICD-10-CM

## 2021-03-20 DIAGNOSIS — I255 Ischemic cardiomyopathy: Secondary | ICD-10-CM

## 2021-03-20 NOTE — Progress Notes (Signed)
Subjective:    Patient ID: Harold Parker, male    DOB: 1946-10-19, 75 y.o.   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  Recall that the patient had COVID-19 and was admitted to [facility] from 11 January 2021 through 24 January 2021 he was discharged home on 4 L/min nasal cannula O2 with exertion and 2 L/min at rest.  He had to be readmitted on 04 February 2021 due to tachycardia and increasing shortness of breath and using his albuterol excessively.  Subsequently he was switched to medications via nebulizer mainly Perforomist with budesonide and Yupelri.  He is now currently on that regimen and feels that this is helping.  He did also have some issues with anxiety and now is controlled with BuSpar.  He has not required use of rescue albuterol much at all since back on his nebulizer regimen.  He has not had any fevers, chills or sweats.  He is limited on ambulation due to his portable concentrator not working on continuous 4 L/min.  He cannot seem to trigger on pulse.  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  He voices no other complaint.  He has had no cough or sputum production.  No chest pain no orthopnea or paroxysmal nocturnal dyspnea.  Most recent echocardiogram of 05 February 2021 showed LVEF 40 to 45% and DD grade I.  He does have pulmonary hypertension which is rated as moderate.  Review of Systems A 10 point review of systems was performed and it is as noted above otherwise negative.  Patient Active Problem List   Diagnosis Date Noted  . Insomnia   . History of COVID-19 pneumonia 01/11/21 02/04/2021  . Pulmonary nodules   . Acute respiratory disease due to COVID-19 virus 01/11/2021  . HTN (hypertension)   . Chronic systolic CHF (congestive heart failure) (HCC)   . COPD (chronic obstructive pulmonary  disease) (HCC)   . GERD (gastroesophageal reflux disease)   . Anxiety   . AKI (acute kidney injury) (HCC)   . Hyperkalemia   . Elevated troponin   . Acute hypoxemic respiratory failure due to COVID-19 (HCC)   . Melena   . Duodenal ulceration   . Gastrointestinal hemorrhage   . Severe anemia 03/14/2018  . 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 S/P aortic valve replacement with bioprosthetic valve  02/19/2018  . Preoperative respiratory examination 02/13/2018  . Acute on chronic respiratory failure with hypoxia (HCC) 02/11/2018  . Influenza with respiratory manifestation 02/11/2018  . Stage 3 severe COPD by GOLD classification (HCC)   . Aortic stenosis   . Bronchitis, chronic obstructive, with exacerbation (HCC)   . Tobacco abuse   . CAD (coronary artery disease) 02/07/2018  . Acute systolic CHF (congestive heart failure) (HCC) 02/07/2018  . Ischemic cardiomyopathy 02/07/2018  . Type 2 diabetes mellitus with hyperglycemia, without long-term current use of insulin (HCC) 02/07/2018  . Hyperlipidemia LDL goal <70 02/07/2018  . COPD with acute exacerbation (HCC) 02/05/2018  . NSTEMI (non-ST elevated myocardial infarction) (HCC) 02/05/2018   Social History   Tobacco Use  . Smoking status: Former Smoker    Packs/day: 1.00    Years: 40.00    Pack years: 40.00    Types: Cigarettes    Quit date: 02/02/2018    Years since quitting: 3.1  .  Smokeless tobacco: Never Used  . Tobacco comment: Quit 01/2018 - on 05/01/2018 talked to [place] about Relas[place] concerns. He ststaes that he has no taste for tobacco since he quit in Feb.  Substance Use Topics  . Alcohol use: Yes    Comment: occ   Allergies  Allergen Reactions  . Atorvastatin Other (See Comments)    Leg aches and weakness    Current Meds  Medication Sig  . acetaminophen (TYLENOL) 500 MG tablet Take 1,000 mg by mouth daily as needed for moderate pain or headache.  . albuterol (PROVENTIL) (2.5 MG/3ML) 0.083% nebulizer solution Take 3 mLs (2.5  mg total) by nebulization every 6 (six) hours as needed for wheezing or shortness of breath.  . ALPRAZolam (XANAX) 0.5 MG tablet Take 0.5 mg by mouth 2 (two) times daily.  Marland Kitchen aspirin EC 81 MG tablet Take 81 mg by mouth daily.  . budesonide (PULMICORT) 0.5 MG/2ML nebulizer solution Take 2 mLs (0.5 mg total) by nebulization 2 (two) times daily. Use AFTER Perforomist  . busPIRone (BUSPAR) 5 MG tablet Take 2 tablets (10 mg total) by mouth 2 (two) times daily.  Marland Kitchen dextromethorphan-guaiFENesin (ROBITUSSIN-DM) 10-100 MG/5ML liquid Take 30 mLs by mouth every 4 (four) hours as needed for cough.  . esomeprazole (NEXIUM) 40 MG capsule Take 40 mg by mouth at bedtime.   Marland Kitchen ezetimibe (ZETIA) 10 MG tablet Take 10 mg by mouth at bedtime.   . folic acid (FOLVITE) 400 MCG tablet Take 400 mcg by mouth every evening.  . formoterol (PERFOROMIST) 20 MCG/2ML nebulizer solution Take 2 mLs (20 mcg total) by nebulization 2 (two) times daily.  . furosemide (LASIX) 40 MG tablet TAKE 1 TABLET BY MOUTH EVERY DAY  . glipiZIDE (GLUCOTROL) 5 MG tablet Take 0.5 tablets (2.5 mg total) by mouth daily before breakfast. (Patient taking differently: Take 5 mg by mouth See admin instructions. Take 5 mg in the morning and take a second 5 mg dose at night on Mon, Wed, and Fri)  . guaiFENesin (MUCINEX) 600 MG 12 hr tablet Take 1,200 mg by mouth every 12 (twelve) hours as needed.  Marland Kitchen losartan (COZAAR) 100 MG tablet Take 100 mg by mouth daily.  . Melatonin 10 MG CAPS Take 10 mg by mouth at bedtime.  . metFORMIN (GLUCOPHAGE) 1000 MG tablet TAKE 1 TABLET BY MOUTH TWICE DAILY WITH A MEAL (Patient taking differently: Take 1,000 mg by mouth 2 (two) times daily.)  . metoprolol succinate (TOPROL-XL) 100 MG 24 hr tablet TAKE 1 TABLET BY MOUTH EVERY DAY WITH OR IMMEDIATELY FOLLOWING A MEAL  . PROAIR HFA 108 (90 Base) MCG/ACT inhaler Inhale 1 puff into the lungs every 4 (four) hours as needed for wheezing or shortness of breath.  . revefenacin (YUPELRI)  175 MCG/3ML nebulizer solution Take 3 mLs (175 mcg total) by nebulization daily. Can be mixed with Perforomist  . traZODone (DESYREL) 50 MG tablet Take 50 mg by mouth at bedtime as needed.  . Vitamin D, Cholecalciferol, 1000 units TABS Take 1,000 Units by mouth every morning.    Immunization History  Administered Date(s) Administered  . Influenza Inj Mdck Quad Pf 12/02/2017  . Influenza Split 12/11/2014, 11/01/2019  . Influenza, High Dose Seasonal PF 09/16/2018, 08/21/2019, 11/01/2020  . PFIZER(Purple Top)SARS-COV-2 Vaccination 02/06/2020, 02/29/2020       Objective:   Physical Exam BP 126/78 (BP Location: Left Arm, Patient Position: Sitting, Cuff Size: Normal)   Pulse 71   Temp (!) 97 F (36.1 C) (Temporal)  Ht 5\' 3"  (1.6 m)   Wt 174 lb 3.2 oz (79 kg)   SpO2 93%   BMI 30.86 kg/m  GENERAL: Chronically ill-appearing man, well nourished, well developed.No conversational dyspnea. Comfortable with nasal cannula O2.   HEAD: Normocephalic, atraumatic.  EYES: Pupils equal, round, reactive to light. No scleral icterus.  MOUTH: Oral mucosa moist. No thrush. NECK: Supple. No thyromegaly. Trachea midline. No JVD. No adenopathy. PULMONARY: Distant breath sounds. Coarse breath sounds with no other adventitious sounds. CARDIOVASCULAR: S1 and S2. Regular rate and rhythm. 1/6 systolic murmur at the lower left sternal border. ABDOMEN: Benign. MUSCULOSKELETAL: No joint deformity, no clubbing, no edema.  NEUROLOGIC: No overt focal deficit, speech is fluent. SKIN: Intact,warm,dry.  On limited exam no rashes. PSYCH:Mood and behavior normal       Assessment & Plan:     ICD-10-CM   1. Stage 3 severe COPD by GOLD classification (HCC)  J44.9 AMB REFERRAL FOR DME   Continue formoterol and budesonide Continue Yupelri Transition to pulmonary rehab  2. Chronic respiratory failure with hypoxia (HCC)  J96.11    Continue oxygen at 2 L/min with rest and 4 L/min with activity Patient cannot  tolerate pulsed O2 Instructed on how to wean for saturations of 89% or better  3. Ischemic cardiomyopathy  I25.5    This issue adds complexity to his management Followed by cardiology  4. Pulmonary hypertension (HCC)  I27.20    Due to Cor pulmonale Continue oxygen supplementation Continue management of COPD   Discussion:  Overall the patient is doing better.  Continue nebulization treatments and oxygen supplementation.  He is completing rehab at home once he completes this he can resume his pulmonary rehab.  We will see him in follow-up in 4 to 6 weeks time he is to contact with prior to that time should any new difficulties arise.   , MD [facility]   *This note was dictated using voice recognition software/Dragon.  Despite best efforts to proofread, errors can occur which can change the meaning.  Any change was purely unintentional.

## 2021-03-20 NOTE — Patient Instructions (Signed)
Continue using your nebulizer solutions as you are doing.  Discuss the issues that you are having with urination with Dr. Pete Glatter.  You may lower your oxygen as long as your saturations are 89% or better.  As soon as you finished with home physical therapy call pulmonary rehab so you can be reinstated with them.  We will see you in follow-up in 4 to 6 weeks time call sooner should any new problems arise.

## 2021-03-21 ENCOUNTER — Encounter: Payer: Self-pay | Admitting: *Deleted

## 2021-03-21 DIAGNOSIS — J449 Chronic obstructive pulmonary disease, unspecified: Secondary | ICD-10-CM

## 2021-03-23 ENCOUNTER — Telehealth: Payer: Self-pay | Admitting: Pulmonary Disease

## 2021-03-23 MED ORDER — AMOXICILLIN-POT CLAVULANATE 875-125 MG PO TABS: 1.0000 | ORAL_TABLET | Freq: Two times a day (BID) | ORAL | 0 refills | Status: DC

## 2021-03-23 MED ORDER — PREDNISONE 20 MG PO TABS: 20.0000 mg | ORAL_TABLET | Freq: Every day | ORAL | 0 refills | Status: DC

## 2021-03-23 NOTE — Telephone Encounter (Signed)
Rx for prednisone and Augmentin has been sent to preferred pharmacy. Patient is aware and voiced his understanding.  Nothing further needed at this time.

## 2021-03-23 NOTE — Telephone Encounter (Signed)
Called and spoke to patient.  Patient reports of sinus pressure, productive cough with green sputum and head congestion. Sx have seen present for 2 days. Denied fever, chills or sweats. spo2 is maintaining between 89- 92% on 4L. He is taking Mucinex BID with some relief in sx.   Dr. Belia Heman, please advise. Dr. Jayme Cloud is unavailable.

## 2021-03-23 NOTE — Telephone Encounter (Signed)
Pred 20 mg daily for 7 days Augmentin 875 mg BID x 10 days

## 2021-03-30 ENCOUNTER — Other Ambulatory Visit: Payer: Self-pay | Admitting: Internal Medicine

## 2021-04-05 ENCOUNTER — Encounter: Payer: Self-pay | Admitting: *Deleted

## 2021-04-05 DIAGNOSIS — J449 Chronic obstructive pulmonary disease, unspecified: Secondary | ICD-10-CM

## 2021-04-05 NOTE — Progress Notes (Signed)
Pulmonary Individual Treatment Plan  Patient Details  Name: Harold Parker MRN: [id number] Date of Birth: Apr 10, 1946 Referring Provider:   Flowsheet Row Pulmonary Rehab from 09/13/2020 in [facility] Cardiac and Pulmonary Rehab  Referring Provider Jayme Cloud      Initial Encounter Date:  Flowsheet Row Pulmonary Rehab from 09/13/2020 in [facility] Cardiac and Pulmonary Rehab  Date 09/13/20      Visit Diagnosis: Chronic obstructive pulmonary disease, unspecified COPD type (HCC)  Patient's Home Medications on Admission:  Current Outpatient Medications:  .  acetaminophen (TYLENOL) 500 MG tablet, Take 1,000 mg by mouth daily as needed for moderate pain or headache., Disp: , Rfl:  .  albuterol (PROVENTIL) (2.5 MG/3ML) 0.083% nebulizer solution, Take 3 mLs (2.5 mg total) by nebulization every 6 (six) hours as needed for wheezing or shortness of breath., Disp: 75 mL, Rfl: 12 .  ALPRAZolam (XANAX) 0.5 MG tablet, Take 0.5 mg by mouth 2 (two) times daily., Disp: , Rfl:  .  amoxicillin-clavulanate (AUGMENTIN) 875-125 MG tablet, Take 1 tablet by mouth 2 (two) times daily., Disp: 20 tablet, Rfl: 0 .  aspirin EC 81 MG tablet, Take 81 mg by mouth daily., Disp: , Rfl:  .  budesonide (PULMICORT) 0.5 MG/2ML nebulizer solution, Take 2 mLs (0.5 mg total) by nebulization 2 (two) times daily. Use AFTER Perforomist, Disp: 120 mL, Rfl: 11 .  busPIRone (BUSPAR) 5 MG tablet, Take 2 tablets (10 mg total) by mouth 2 (two) times daily., Disp: , Rfl:  .  dextromethorphan-guaiFENesin (ROBITUSSIN-DM) 10-100 MG/5ML liquid, Take 30 mLs by mouth every 4 (four) hours as needed for cough., Disp: , Rfl:  .  esomeprazole (NEXIUM) 40 MG capsule, Take 40 mg by mouth at bedtime. , Disp: , Rfl:  .  ezetimibe (ZETIA) 10 MG tablet, Take 10 mg by mouth at bedtime. , Disp: , Rfl:  .  folic acid (FOLVITE) 400 MCG tablet, Take 400 mcg by mouth every evening., Disp: , Rfl:  .  formoterol (PERFOROMIST) 20 MCG/2ML nebulizer solution, Take 2 mLs (20 mcg  total) by nebulization 2 (two) times daily., Disp: 120 mL, Rfl: 11 .  furosemide (LASIX) 40 MG tablet, TAKE 1 TABLET BY MOUTH EVERY DAY, Disp: 90 tablet, Rfl: 3 .  glipiZIDE (GLUCOTROL) 5 MG tablet, Take 0.5 tablets (2.5 mg total) by mouth daily before breakfast. (Patient taking differently: Take 5 mg by mouth See admin instructions. Take 5 mg in the morning and take a second 5 mg dose at night on Mon, Wed, and Fri), Disp: 30 tablet, Rfl: 1 .  guaiFENesin (MUCINEX) 600 MG 12 hr tablet, Take 1,200 mg by mouth every 12 (twelve) hours as needed., Disp: , Rfl:  .  losartan (COZAAR) 100 MG tablet, Take 100 mg by mouth daily., Disp: , Rfl:  .  Melatonin 10 MG CAPS, Take 10 mg by mouth at bedtime., Disp: , Rfl:  .  metFORMIN (GLUCOPHAGE) 1000 MG tablet, TAKE 1 TABLET BY MOUTH TWICE DAILY WITH A MEAL (Patient taking differently: Take 1,000 mg by mouth 2 (two) times daily.), Disp: 60 tablet, Rfl: 0 .  metoprolol succinate (TOPROL-XL) 100 MG 24 hr tablet, TAKE 1 TABLET BY MOUTH EVERY DAY WITH OR IMMEDIATELY FOLLOWING A MEAL, Disp: 90 tablet, Rfl: 1 .  predniSONE (DELTASONE) 20 MG tablet, Take 1 tablet (20 mg total) by mouth daily with breakfast., Disp: 7 tablet, Rfl: 0 .  PROAIR HFA 108 (90 Base) MCG/ACT inhaler, Inhale 1 puff into the lungs every 4 (four) hours as needed for wheezing  or shortness of breath., Disp: , Rfl:  .  revefenacin (YUPELRI) 175 MCG/3ML nebulizer solution, Take 3 mLs (175 mcg total) by nebulization daily. Can be mixed with Perforomist, Disp: 90 mL, Rfl: 11 .  sucralfate (CARAFATE) 1 GM/10ML suspension, Take 10 mLs (1 g total) by mouth 4 (four) times daily -  with meals and at bedtime. (Patient not taking: Reported on 03/20/2021), Disp: 420 mL, Rfl: 0 .  traZODone (DESYREL) 50 MG tablet, Take 50 mg by mouth at bedtime as needed., Disp: , Rfl:  .  Vitamin D, Cholecalciferol, 1000 units TABS, Take 1,000 Units by mouth every morning. , Disp: , Rfl:   Past Medical History: Past Medical History:   Diagnosis Date  . Bilateral carotid bruits    a. 01/2018 U/S: < 50% bilat ICA stenosis.  Marland Kitchen CAD (coronary artery disease)    a. 1998 s/p MI and BMS [facility], [place]); b. 1999 redo PCI/rotablator in setting of what sounds like ISR;  c. Multiple stress tests over the years - last ~ 2017, reportedly nl; d. 01/2018 NSTEMI/Cath: LM 41m/d, LAD 50p, 40p/m, D1 60ost, OM1 95, RCA 100ost/p w/ L->R collats, EF 45%; e. s/p 3V CABG 02/19/18 (LIMA-LAD, VG-D1, VG-OM)  . Chronic lower back pain   . COPD (chronic obstructive pulmonary disease) (HCC)   . GIB (gastrointestinal bleeding)    a. 02/2018 GIB and anemia w/ Hgb of 4.7 on presentation; b. 03/2018 EGD: 2 nonbleeding duodenal ulcers.  Marland Kitchen HTN (hypertension)   . Hypercholesteremia   . Ischemic cardiomyopathy    a. 01/2018 Echo: EF 40-45%, mid-apicalanteroseptal, ant, apical sev HK, mod apicalinferior HK. Gr2 DD. Mod AS, mild MR, mod dil LA, PASP .  . Moderate aortic stenosis    a. 01/2018 Echo: Mod AS, mean grad (S) , Valve area (VTI) 1.06 cm^2, (Vmax) 1.27 cm^2; b. 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  . Myocardial infarction (HCC) ~ 1998/1999  . 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 02/19/2018 AVR: 25 mm Edwards Inspiris Resilia stented bovine pericardial tissue valve  . 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 Tobacco abuse   . Type II diabetes mellitus (HCC)     Tobacco Use: Social History   Tobacco Use  Smoking Status Former Smoker  . Packs/day: 1.00  . Years: 40.00  . Pack years: 40.00  . Types: Cigarettes  . Quit date: 02/02/2018  . Years since quitting: 3.1  Smokeless Tobacco Never Used  Tobacco Comment   Quit 01/2018 - on 05/01/2018 talked to Damichael about Relaspe concerns. He ststaes that he has no taste for tobacco since he quit in Feb.    Labs: Recent Review Flowsheet Data    Labs for ITP Cardiac and Pulmonary Rehab Latest Ref Rng & Units 02/20/2018  03/27/2018 01/11/2021 01/12/2021 02/06/2021   Cholestrol 0 - 200 mg/dL - 161 - 90 -   LDLCALC 0 - 99 mg/dL - 50 - 45 -   HDL >09 mg/dL - 60(A) - 54(U) -   Trlycerides <150 mg/dL - 84 55 87 -   Hemoglobin A1c 4.8 - 5.6 % - - - 7.5(H) -   PHART 7.350 - 7.450 7.331(L) - - - 7.47(H)   PCO2ART 32.0 - 48.0 mmHg 41.0 - - - 36   HCO3 20.0 - 28.0 mmol/L 21.5 - - - 26.2   TCO2 22 - 32 mmol/L 23 - - - -  ACIDBASEDEF 0.0 - 2.0 mmol/L 4.0(H) - - - -   O2SAT % 95.0 - - - 92.6       Pulmonary Assessment Scores:   UCSD: Self-administered rating of dyspnea associated with activities of daily living (ADLs) 6-point scale (0 = "not at all" to 5 = "maximal or unable to do because of breathlessness")  Scoring Scores range from 0 to 120.  Minimally important difference is 5 units  CAT: CAT can identify the health impairment of COPD patients and is better correlated with disease progression.  CAT has a scoring range of zero to 40. The CAT score is classified into four groups of low (less than 10), medium (10 - 20), high (21-30) and very high (31-40) based on the impact level of disease on health status. A CAT score over 10 suggests significant symptoms.  A worsening CAT score could be explained by an exacerbation, poor medication adherence, poor inhaler technique, or progression of COPD or comorbid conditions.  CAT MCID is 2 points  mMRC: mMRC (Modified Medical Research Council) Dyspnea Scale is used to assess the degree of baseline functional disability in patients of respiratory disease due to dyspnea. No minimal important difference is established. A decrease in score of 1 point or greater is considered a positive change.   Pulmonary Function Assessment:   Exercise Target Goals: Exercise Program Goal: Individual exercise prescription set using results from initial 6 min walk test and THRR while considering  patient's activity barriers and safety.   Exercise Prescription Goal: Initial exercise  prescription builds to 30-45 minutes a day of aerobic activity, 2-3 days per week.  Home exercise guidelines will be given to patient during program as part of exercise prescription that the participant will acknowledge.  Education: Aerobic Exercise: - Group verbal and visual presentation on the components of exercise prescription. Introduces F.I.T.T principle from ACSM for exercise prescriptions.  Reviews F.I.T.T. principles of aerobic exercise including progression. Written material given at graduation. Flowsheet Row Cardiac Rehab from 09/02/2018 in [facility] Cardiac and Pulmonary Rehab  Date 07/24/18  Educator AS  Instruction Review Code 1- Verbalizes Understanding      Education: Resistance Exercise: - Group verbal and visual presentation on the components of exercise prescription. Introduces F.I.T.T principle from ACSM for exercise prescriptions  Reviews F.I.T.T. principles of resistance exercise including progression. Written material given at graduation.    Education: Exercise & Equipment Safety: - Individual verbal instruction and demonstration of equipment use and safety with use of the equipment. Flowsheet Row Pulmonary Rehab from 09/21/2020 in [facility] Cardiac and Pulmonary Rehab  Date 09/13/20  Educator AS  Instruction Review Code 1- Verbalizes Understanding      Education: Exercise Physiology & General Exercise Guidelines: - Group verbal and written instruction with models to review the exercise physiology of the cardiovascular system and associated critical values. Provides general exercise guidelines with specific guidelines to those with heart or lung disease.  Flowsheet Row Cardiac Rehab from 09/02/2018 in [facility] Cardiac and Pulmonary Rehab  Date 05/20/18  Educator AS  Instruction Review Code 4- No Evidence of Learning  [charting in error]      Education: Flexibility, Balance, Mind/Body Relaxation: - Group verbal and visual presentation with interactive activity on the components  of exercise prescription. Introduces F.I.T.T principle from ACSM for exercise prescriptions. Reviews F.I.T.T. principles of flexibility and balance exercise training including progression. Also discusses the mind body connection.  Reviews various relaxation techniques to help reduce and manage stress (i.e. Deep breathing, progressive muscle relaxation,  and visualization). Balance handout provided to take home. Written material given at graduation. Flowsheet Row Cardiac Rehab from 09/02/2018 in [facility] Cardiac and Pulmonary Rehab  Date 07/29/18  Educator AS  Instruction Review Code 1- Verbalizes Understanding      Activity Barriers & Risk Stratification:   6 Minute Walk:  Oxygen Initial Assessment:   Oxygen Re-Evaluation:  Oxygen Re-Evaluation    Row Name 10/07/20 1113 11/02/20 1107 11/30/20 1118 12/21/20 1127       Program Oxygen Prescription   Program Oxygen Prescription Continuous;E-Tanks Continuous;E-Tanks Continuous;E-Tanks Continuous    Liters per minute 3 3 3 3          Home Oxygen   Home Oxygen Device Home Concentrator;Portable Concentrator Home Concentrator;Portable Concentrator Home Concentrator;Portable Concentrator Home Concentrator;Portable Concentrator;E-Tanks    Sleep Oxygen Prescription Continuous Continuous Continuous Continuous    Liters per minute 2 2 2 2     Home Exercise Oxygen Prescription Continuous Continuous Continuous Continuous    Liters per minute 2 2 2 2     Home Resting Oxygen Prescription Continuous Continuous Continuous Continuous    Liters per minute 2 2 2 2   as Needed    Compliance with Home Oxygen Use Yes No  He will use it overnight and will use it when he feels he needs it. Yes Yes         Goals/Expected Outcomes   Short Term Goals To learn and understand importance of maintaining oxygen saturations>88%;To learn and understand importance of monitoring SPO2 with pulse oximeter and demonstrate accurate use of the pulse oximeter. To learn and understand  importance of maintaining oxygen saturations>88%;To learn and understand importance of monitoring SPO2 with pulse oximeter and demonstrate accurate use of the pulse oximeter. To learn and understand importance of maintaining oxygen saturations>88%;To learn and understand importance of monitoring SPO2 with pulse oximeter and demonstrate accurate use of the pulse oximeter.;To learn and exhibit compliance with exercise, home and travel O2 prescription;To learn and demonstrate proper pursed lip breathing techniques or other breathing techniques.;To learn and demonstrate proper use of respiratory medications To learn and understand importance of monitoring SPO2 with pulse oximeter and demonstrate accurate use of the pulse oximeter.;To learn and understand importance of maintaining oxygen saturations>88%    Long  Term Goals Maintenance of O2 saturations>88%;Verbalizes importance of monitoring SPO2 with pulse oximeter and return demonstration Maintenance of O2 saturations>88%;Verbalizes importance of monitoring SPO2 with pulse oximeter and return demonstration Exhibits compliance with exercise, home and travel O2 prescription;Verbalizes importance of monitoring SPO2 with pulse oximeter and return demonstration;Maintenance of O2 saturations>88%;Exhibits proper breathing techniques, such as pursed lip breathing or other method taught during program session;Compliance with respiratory medication;Demonstrates proper use of MDI's Maintenance of O2 saturations>88%;Verbalizes importance of monitoring SPO2 with pulse oximeter and return demonstration    Comments He has a pulse oximeter to check his oxygen saturation at home. Informed and explained why it is important to have one. Reviewed that oxygen saturations should be 88 percent and above. Patient has a pulse oximeter at home to check his oxygen. He reports cheking O2 2-3x/day and uses his nebulizer 2x/day. His O2 is usually 80-90, expressed importance of keeping O2 over  88. Reviewed PLB. Owin is staying compliant with his oxygen and his breathing is getting better.  He continues to monitor his saturations and doing well with his breathing and using his PLB. Manvir states he is going to use his oxygen when walking in to rehab. Informed him that his oxygen could be getting to low without  it. He has a wrist band that monitors his oxygen and knows he needs to be 88 percent and above. He has no questions about his medications.    Goals/Expected Outcomes Short: monitor oxygen at home with exertion. Long: maintain oxygen saturations above 88 percent independently. ST: use O2 as prescribed at home LT: maintain O2 above 88 percent independently. Short: Continue to breathing better  Long; Continue to improve compliance Short: wear oxygen into rehab. Long: maintain oxygen saturations of 88 percent and above independently.           Oxygen Discharge (Final Oxygen Re-Evaluation):  Oxygen Re-Evaluation - 12/21/20 1127      Program Oxygen Prescription   Program Oxygen Prescription Continuous    Liters per minute 3      Home Oxygen   Home Oxygen Device Home Concentrator;Portable Concentrator;E-Tanks    Sleep Oxygen Prescription Continuous    Liters per minute 2    Home Exercise Oxygen Prescription Continuous    Liters per minute 2    Home Resting Oxygen Prescription Continuous    Liters per minute 2   as Needed   Compliance with Home Oxygen Use Yes      Goals/Expected Outcomes   Short Term Goals To learn and understand importance of monitoring SPO2 with pulse oximeter and demonstrate accurate use of the pulse oximeter.;To learn and understand importance of maintaining oxygen saturations>88%    Long  Term Goals Maintenance of O2 saturations>88%;Verbalizes importance of monitoring SPO2 with pulse oximeter and return demonstration    Comments Esequiel states he is going to use his oxygen when walking in to rehab. Informed him that his oxygen could be getting to low without it.  He has a wrist band that monitors his oxygen and knows he needs to be 88 percent and above. He has no questions about his medications.    Goals/Expected Outcomes Short: wear oxygen into rehab. Long: maintain oxygen saturations of 88 percent and above independently.           Initial Exercise Prescription:   Perform Capillary Blood Glucose checks as needed.  Exercise Prescription Changes:  Exercise Prescription Changes    Row Name 10/19/20 0800 10/31/20 1500 11/14/20 1600 11/30/20 1400 12/14/20 1000     Response to Exercise   Blood Pressure (Admit) 160/80 142/70 138/70 118/60 140/64   Blood Pressure (Exercise) 144/80 150/64 142/76 130/70 138/58   Blood Pressure (Exit) 130/80 122/58 128/58 118/58 122/58   Heart Rate (Admit) 80 bpm 82 bpm 83 bpm 100 bpm 74 bpm   Heart Rate (Exercise) 92 bpm 86 bpm 89 bpm 888 bpm 87 bpm   Heart Rate (Exit) 82 bpm 78 bpm 87 bpm 81 bpm 78 bpm   Oxygen Saturation (Admit) 88 % 92 % 94 % 90 % 89 %   Oxygen Saturation (Exercise) 92 % 88 % 93 % 92 % 87 %   Oxygen Saturation (Exit) 92 % 97 % 92 % 95 % 92 %   Rating of Perceived Exertion (Exercise) 13 11 11 13 11    Perceived Dyspnea (Exercise) 0 2 -- 2 0   Symptoms -- none none SOB --   Duration Continue with 30 min of aerobic exercise without signs/symptoms of physical distress. Continue with 30 min of aerobic exercise without signs/symptoms of physical distress. Continue with 30 min of aerobic exercise without signs/symptoms of physical distress. Continue with 30 min of aerobic exercise without signs/symptoms of physical distress. Continue with 30 min of aerobic exercise without signs/symptoms of  physical distress.   Intensity THRR unchanged THRR unchanged THRR unchanged THRR unchanged THRR unchanged     Progression   Progression Continue to progress workloads to maintain intensity without signs/symptoms of physical distress. Continue to progress workloads to maintain intensity without signs/symptoms of  physical distress. Continue to progress workloads to maintain intensity without signs/symptoms of physical distress. Continue to progress workloads to maintain intensity without signs/symptoms of physical distress. Continue to progress workloads to maintain intensity without signs/symptoms of physical distress.   Average METs 2.5 2.8 2.15 2.4 1.9     Resistance Training   Training Prescription Yes Yes Yes Yes Yes   Weight 5 lb 5 lb 5 lb 5 lb 5 lb   Reps 10-15 10-15 10-15 10-15 10-15     Interval Training   Interval Training No No No No No     Oxygen   Oxygen Continuous Continuous Continuous Continuous Continuous   Liters 3 3 3 3 3      NuStep   Level 6 6 4 6 6    SPM 80 -- 80 -- --   Minutes 15 15 15 15 15    METs 2.5 2.8 2.15 2.4 1.9     Home Exercise Plan   Plans to continue exercise at -- -- -- Home (comment)  bike, walking Home (comment)  bike, walking   Frequency -- -- -- Add 2 additional days to program exercise sessions. Add 2 additional days to program exercise sessions.   Initial Home Exercises Provided -- -- -- 11/02/20 11/02/20   Row Name 12/26/20 1100             Response to Exercise   Blood Pressure (Admit) 142/80       Blood Pressure (Exercise) 138/64       Blood Pressure (Exit) 126/70       Heart Rate (Admit) 78 bpm       Heart Rate (Exercise) 78 bpm       Heart Rate (Exit) 64 bpm       Oxygen Saturation (Admit) 88 %       Oxygen Saturation (Exercise) 88 %       Oxygen Saturation (Exit) 98 %       Rating of Perceived Exertion (Exercise) 11       Perceived Dyspnea (Exercise) 1       Symptoms SOB       Duration Continue with 30 min of aerobic exercise without signs/symptoms of physical distress.       Intensity THRR unchanged               Progression   Progression Continue to progress workloads to maintain intensity without signs/symptoms of physical distress.       Average METs 2.2               Resistance Training   Training Prescription Yes        Weight 5 lb       Reps 10-15               Interval Training   Interval Training No               Oxygen   Oxygen Continuous       Liters 6  bad breathing day               NuStep   Level 4       Minutes 30       METs 2.2  Home Exercise Plan   Plans to continue exercise at Home (comment)  bike, walking       Frequency Add 2 additional days to program exercise sessions.       Initial Home Exercises Provided 11/02/20              Exercise Comments:   Exercise Goals and Review:   Exercise Goals Re-Evaluation :  Exercise Goals Re-Evaluation    Row Name 10/07/20 1123 10/19/20 0826 10/31/20 1517 11/02/20 1141 11/14/20 1642     Exercise Goal Re-Evaluation   Exercise Goals Review Understanding of Exercise Prescription Increase Physical Activity;Increase Strength and Stamina Increase Physical Activity;Increase Strength and Stamina;Understanding of Exercise Prescription Increase Physical Activity;Increase Strength and Stamina;Understanding of Exercise Prescription Increase Physical Activity;Increase Strength and Stamina;Understanding of Exercise Prescription   Comments Raph states that he is interested in the [place] once he is done with the program. He is going to look into his insurance to see if he has [employer]. Informed him that the gym has oxygen tanks and would be good to continue his exercise. Friedrich has been attending twice per week most weeks.  Staff will review home exercise so he can add 1-2 days at home for better progress.  He has moved up to level 6 on NS. Only attended once since last review. Zayquan continues to just use the same station as it does not hurt him at all.  We will continue to montior his progress. Reviewed home exercise with pt today.  Pt plans to walk/bike, may go to [facility] for exercise.  Reviewed THR, pulse, RPE, sign and symptoms, pulse oximetery and when to call 911 or MD.  Also discussed weather considerations and indoor options.   Pt voiced understanding. Raif lowered level on T4 today.  Staff will continue to monitor progress.   Expected Outcomes Short: check with insurance if he has [employer]. Long: maintain an exercise regimine post [facility]. Short: review home exercise Long: exercise 3-5 days per week consistently Short: Return to regular attendance  Long: Continue to exercise regularly. -- Short: get back up to previous levels Long: maintain consistent exercise   Row Name 11/30/20 1112 12/14/20 1006 12/26/20 1116 01/23/21 1003 02/15/21 1514     Exercise Goal Re-Evaluation   Exercise Goals Review Increase Physical Activity;Increase Strength and Stamina;Understanding of Exercise Prescription Increase Physical Activity;Increase Strength and Stamina Increase Physical Activity;Increase Strength and Stamina -- --   Comments Baran is doing well in rehab.  He is walking on his off days.  He is feeling stronger overall and better. Mujahid has moved up to level 6 on NS.  More consistent attendance would help him progress more. Luisantonio stated that he feels like he is making progress with his exercise and is getting stronger. He said he works at his own pace and feels like he is doing well. HR and SaO2 are maintained in acceptable ranges duing exercise. Currently admitted and out since last review Out since last review.  Currently home on medical hold for 2 weeks   Expected Outcomes Short: Continue to stay active  Long: Continue to improve stamina. Short:  attend at least twice per week Long: improve overall stamina Short: continue to attend pulmonary rehab at least 2 days a week. Long: Plans to exercise outside and be more active once the temperature increases which is easier on his breathing. -- --   Row Name 02/22/21 1026 03/21/21 0824           Exercise Goal  Re-Evaluation   Comments Out since last review, supposed to return next week. Out since last review, hold to finish home PT             Discharge Exercise  Prescription (Final Exercise Prescription Changes):  Exercise Prescription Changes - 12/26/20 1100      Response to Exercise   Blood Pressure (Admit) 142/80    Blood Pressure (Exercise) 138/64    Blood Pressure (Exit) 126/70    Heart Rate (Admit) 78 bpm    Heart Rate (Exercise) 78 bpm    Heart Rate (Exit) 64 bpm    Oxygen Saturation (Admit) 88 %    Oxygen Saturation (Exercise) 88 %    Oxygen Saturation (Exit) 98 %    Rating of Perceived Exertion (Exercise) 11    Perceived Dyspnea (Exercise) 1    Symptoms SOB    Duration Continue with 30 min of aerobic exercise without signs/symptoms of physical distress.    Intensity THRR unchanged      Progression   Progression Continue to progress workloads to maintain intensity without signs/symptoms of physical distress.    Average METs 2.2      Resistance Training   Training Prescription Yes    Weight 5 lb    Reps 10-15      Interval Training   Interval Training No      Oxygen   Oxygen Continuous    Liters 6   bad breathing day     NuStep   Level 4    Minutes 30    METs 2.2      Home Exercise Plan   Plans to continue exercise at Home (comment)   bike, walking   Frequency Add 2 additional days to program exercise sessions.    Initial Home Exercises Provided 11/02/20           Nutrition:  Target Goals: Understanding of nutrition guidelines, daily intake of sodium 1500mg , cholesterol 200mg , calories 30% from fat and 7% or less from saturated fats, daily to have 5 or more servings of fruits and vegetables.  Education: All About Nutrition: -Group instruction provided by verbal, written material, interactive activities, discussions, models, and posters to present general guidelines for heart healthy nutrition including fat, fiber, MyPlate, the role of sodium in heart healthy nutrition, utilization of the nutrition label, and utilization of this knowledge for meal planning. Follow up email sent as well. Written material given at  graduation. Flowsheet Row Cardiac Rehab from 09/02/2018 in [facility] Cardiac and Pulmonary Rehab  Date 08/26/18  Educator LB  Instruction Review Code 1- Verbalizes Understanding      Biometrics:    Nutrition Therapy Plan and Nutrition Goals:  Nutrition Therapy & Goals - 11/02/20 1124      Personal Nutrition Goals   Nutrition Goal No goals at this time.    Comments Got two more books on DM at the book store this weekend. B; cereal - cherrios, rice krispies, and oatmeal, eggs and bacon, tail of ham - egss and cheese on a roll. L: sometimes will skip D: loves pasta. He eats salads, chicken, steak, escarole and beans. He doesn't care for poatoes aside from hashbrowns. S: cookies - sleep time tea - he will also have protein drinks with his coffee in the afternoon. He reports using stevia and limiting simple sugars. He reports being through education before and not wanting to make any changes at this time.           Nutrition Assessments:  MEDIFICTS Score Key:  ?70 Need to make dietary changes   40-70 Heart Healthy Diet  ? 40 Therapeutic Level Cholesterol Diet   Picture Your Plate Scores:  <40<40 Unhealthy dietary pattern with much room for improvement.  41-50 Dietary pattern unlikely to meet recommendations for good health and room for improvement.  51-60 More healthful dietary pattern, with some room for improvement.   >60 Healthy dietary pattern, although there may be some specific behaviors that could be improved.   Nutrition Goals Re-Evaluation:  Nutrition Goals Re-Evaluation    Row Name 11/30/20 1117 12/28/20 1144           Goals   Nutrition Goal Eat healthier Pt would not like to make goals at this time      Comment Rayna SextonRalph has gotten better about his diet and is really trying to be good through holidays.  He is feeling better about it.Rayna Sexton. Xzavior continues to have no questions regarding nutritoin and would not like to make any changes. He reports eating a mostly healthy diet.       Expected Outcome Short: Continue to focus on heart healthy Long; Continue to work on weight loss Pt would not like to make goals at this time             Nutrition Goals Discharge (Final Nutrition Goals Re-Evaluation):  Nutrition Goals Re-Evaluation - 12/28/20 1144      Goals   Nutrition Goal Pt would not like to make goals at this time    Comment Rayna SextonRalph continues to have no questions regarding nutritoin and would not like to make any changes. He reports eating a mostly healthy diet.    Expected Outcome Pt would not like to make goals at this time           Psychosocial: Target Goals: Acknowledge presence or absence of significant depression and/or stress, maximize coping skills, provide positive support system. Participant is able to verbalize types and ability to use techniques and skills needed for reducing stress and depression.   Education: Stress, Anxiety, and Depression - Group verbal and visual presentation to define topics covered.  Reviews how body is impacted by stress, anxiety, and depression.  Also discusses healthy ways to reduce stress and to treat/manage anxiety and depression.  Written material given at graduation. Flowsheet Row [facility]  Date 09/21/20  [facility]ator [facility]  Instruction Review Code 1- [employer]bVerbalizes Understanding      Education: Sleep Hygiene -Provides group verbal and written instruction about how sleep can affect your health.  Define sleep hygiene, discuss sleep cycles and impact of sleep habits. Review good sleep hygiene tips.  Flow[facility]  Date 08/19/18  Educator [facility]  Instruction Review Code 1- Verbalizes Understanding  [Arrived late to class]      Initial Review & Psychosocial Screening:   Quality of Life Scores:  Scores of 19 and below usually indicate a poorer quality of life in these areas.  A difference of  2-3 points  is a clinically meaningful difference.  A difference of 2-3 points in the total score of the Quality of Life Index has been associated with significant improvement in overall quality of life, self-image, physical symptoms, and general health in studies assessing change in quality of life.  PHQ-9: Recent Review Flowsheet Data    Depression screen [facility] 2/9 09/13/2020 08/26/2018 05/01/2018   Decreased Interest 0 0 0  Down, Depressed, Hopeless 0 0 0   PHQ - 2 Score 0 0 0   Altered sleeping 0 0 0   Tired, decreased energy 1 1 0   Change in appetite 0 - 0   Feeling bad or failure about yourself  0 0 0   Trouble concentrating 0 0 0   Moving slowly or fidgety/restless 0 0 0   Suicidal thoughts 0 0 0   PHQ-9 Score 1 1 0   Difficult doing work/chores Not difficult at all Not difficult at all Not difficult at all     Interpretation of Total Score  Total Score Depression Severity:  1-4 = Minimal depression, 5-9 = Mild depression, 10-14 = Moderate depression, 15-19 = Moderately severe depression, 20-27 = Severe depression   Psychosocial Evaluation and Intervention:   Psychosocial Re-Evaluation:  Psychosocial Re-Evaluation    Row Name 10/07/20 1118 11/02/20 1111 11/30/20 1114 12/26/20 1134       Psychosocial Re-Evaluation   Current issues with Current Stress Concerns;Current Anxiety/Panic Current Anxiety/Panic Current Anxiety/Panic;Current Stress Concerns Current Anxiety/Panic;Current Stress Concerns    Comments Rhyder gets anxious when he gets short of breath. His breathing is the only issue that gets him down. He states that his mind is sharp and is willing to get healthier to breath easier. He reports he is on xanax for anxiety for 7 years and reports it's helping. He reports getting overwhelmed sometimes with anxiety when things don't go right, gotten woirse since his wife passed 7 years ago. He will ride his motorcycle and watches TV to reduce stress and anxiety, he has techniques where he  reduces his panic - ice cold water. Support system in girlfriend who he lives with and chicldren who are in [place]. Colman is doing well with exercise.  He is frustrated about not seeing his kids and grandkids for Christmas, but they do have plans to go up in January.  He is then just worried about the cost of gas and travel.  He is planning to ride his motorcycle this weekend.  He is sleeping well and breathing better with the cooler air.  His anxiety has been up a little with his frustrations, but overall doing well on his xanax. Xayden reported no major changes in his anxiety. He does continue to take his anxiety medication and works with his breathing when he does become anxious. He does have a partner that he talks to for support and tries not to think to far ahead about things that he knows will make his anxious. He stated he wants to call Dr. Jayme Cloud this week to talk about his breathing difficulties when he does have anxiety attacks.    Expected Outcomes Short: continue [facility] to improve shortness of breath. Long: maintain exercise to keep stress at a minimum. Short: continue [facility] to improve shortness of breath. Long: maintain exercise to keep stress at a minimum, utilize support system, keep using relaxing activities and techniques. Short: Look forward to traveling after Christmas.  Long: Continue to manage activities. Short: Call doctor to discuss anxiety concerns. Long: continue to work on managing anxiety and maintaining good mental health.    Interventions Encouraged to attend Pulmonary Rehabilitation for the exercise Encouraged to attend Pulmonary Rehabilitation for the exercise Encouraged to attend Pulmonary Rehabilitation for the exercise Encouraged to attend Pulmonary Rehabilitation for the exercise    Continue Psychosocial Services  Follow up required by staff Follow up required by staff Follow up required by staff Follow up required by  staff         Initial Review   Source of Stress  Concerns -- Chronic Illness -- --           Psychosocial Discharge (Final Psychosocial Re-Evaluation):  Psychosocial Re-Evaluation - 12/26/20 1134      Psychosocial Re-Evaluation   Current issues with Current Anxiety/Panic;Current Stress Concerns    Comments Abrar reported no major changes in his anxiety. He does continue to take his anxiety medication and works with his breathing when he does become anxious. He does have a partner that he talks to for support and tries not to think to far ahead about things that he knows will make his anxious. He stated he wants to call Dr. Jayme Cloud this week to talk about his breathing difficulties when he does have anxiety attacks.    Expected Outcomes Short: Call doctor to discuss anxiety concerns. Long: continue to work on managing anxiety and maintaining good mental health.    Interventions Encouraged to attend Pulmonary Rehabilitation for the exercise    Continue Psychosocial Services  Follow up required by staff           Education: Education Goals: Education classes will be provided on a weekly basis, covering required topics. Participant will state understanding/return demonstration of topics presented.  Learning Barriers/Preferences:   General Pulmonary Education Topics:  Infection Prevention: - Provides verbal and written material to individual with discussion of infection control including proper hand washing and proper equipment cleaning during exercise session. Flowsheet Row Pulmonary Rehab from 09/21/2020 in [facility] Cardiac and Pulmonary Rehab  Date 09/13/20  Educator AS  Instruction Review Code 1- Verbalizes Understanding      Falls Prevention: - Provides verbal and written material to individual with discussion of falls prevention and safety. Flowsheet Row [facility]  Date 09/13/20  Educator AS  Instruction Review Code 1- Verbalizes Understanding      Chronic Lung  Disease Review: - Group verbal instruction with posters, models, PowerPoint presentations and videos,  to review new updates, new respiratory medications, new advancements in procedures and treatments. Providing information on websites and "800" numbers for continued self-education. Includes information about supplement oxygen, available portable oxygen systems, continuous and intermittent flow rates, oxygen safety, concentrators, and Medicare reimbursement for oxygen. Explanation of Pulmonary Drugs, including class, frequency, complications, importance of spacers, rinsing mouth after steroid MDI's, and proper cleaning methods for nebulizers. Review of basic lung anatomy and physiology related to function, structure, and complications of lung disease. Review of risk factors. Discussion about methods for diagnosing sleep apnea and types of masks and machines for OSA. Includes a review of the use of types of environmental controls: home humidity, furnaces, filters, dust mite/pet prevention, HEPA vacuums. Discussion about weather changes, air quality and the benefits of nasal washing. Instruction on Warning signs, infection symptoms, calling MD promptly, preventive modes, and value of vaccinations. Review of effective airway clearance, coughing and/or vibration techniques. Emphasizing that all should Create an Action Plan. Written material given at graduation.   AED/CPR: - Group verbal and written instruction with the use of models to demonstrate the basic use of the AED with the basic ABC's of resuscitation. Flowsheet Row Cardiac Rehab from 09/02/2018 in [facility] Cardiac and Pulmonary Rehab  Date 09/02/18  Educator [place]  Instruction Review Code 1- Verbalizes Understanding       Anatomy and Cardiac Procedures: - Group verbal and visual presentation and models provide information about basic cardiac anatomy and  function. Reviews the testing methods done to diagnose heart disease and the outcomes of the test  results. Describes the treatment choices: Medical Management, Angioplasty, or Coronary Bypass Surgery for treating various heart conditions including Myocardial Infarction, Angina, Valve Disease, and Cardiac Arrhythmias.  Written material given at graduation. Flowsheet Row Cardiac Rehab from 09/02/2018 in [facility] Cardiac and Pulmonary Rehab  Date 08/07/18  Educator CE  Instruction Review Code 1- Verbalizes Understanding      Medication Safety: - Group verbal and visual instruction to review commonly prescribed medications for heart and lung disease. Reviews the medication, class of the drug, and side effects. Includes the steps to properly store meds and maintain the prescription regimen.  Written material given at graduation. Flowsheet Row Cardiac Rehab from 09/02/2018 in [facility] Cardiac and Pulmonary Rehab  Date 08/12/18  Educator SB  Instruction Review Code 1- Verbalizes Understanding      Other: -Provides group and verbal instruction on various topics (see comments)   Knowledge Questionnaire Score:    Core Components/Risk Factors/Patient Goals at Admission:   Education:Diabetes - Individual verbal and written instruction to review signs/symptoms of diabetes, desired ranges of glucose level fasting, after meals and with exercise. Acknowledge that pre and post exercise glucose checks will be done for 3 sessions at entry of program. Flowsheet Row Cardiac Rehab from 09/02/2018 in [facility] Cardiac and Pulmonary Rehab  Date 05/01/18  Educator [facility]  Instruction Review Code 1- Verbalizes Understanding      Know Your Numbers and Heart Failure: - Group verbal and visual instruction to discuss disease risk factors for cardiac and pulmonary disease and treatment options.  Reviews associated critical values for Overweight/Obesity, Hypertension, Cholesterol, and Diabetes.  Discusses basics of heart failure: signs/symptoms and treatments.  Introduces Heart Failure Zone chart for action plan for heart  failure.  Written material given at graduation. Flowsheet Row Cardiac Rehab from 09/02/2018 in [facility] Cardiac and Pulmonary Rehab  Date 08/07/18  Educator CE  Instruction Review Code 1- Verbalizes Understanding      Core Components/Risk Factors/Patient Goals Review:   Goals and Risk Factor Review    Row Name 10/07/20 1116 11/02/20 1120 11/30/20 1121 12/26/20 1123       Core Components/Risk Factors/Patient Goals Review   Personal Goals Review Improve shortness of breath with ADL's Improve shortness of breath with ADL's;Diabetes Improve shortness of breath with ADL's;Diabetes;Weight Management/Obesity;Hypertension Improve shortness of breath with ADL's;Diabetes;Weight Management/Obesity;Hypertension    Review If he is doing chores at home and if things are too strennuous he get anxious and short of breath.Spoke to patient about their shortness of breath and what they can do to improve. Patient has been informed of breathing techniques when starting the program. Patient is informed to tell staff if they have had any med changes and that certain meds they are taking or not taking can be causing shortness of breath. This weekend BG got up to 400 after syrup and pineapple juice - took 2 metformin and it went down to 40. Feeling better now. BG in am usually 125. Educated on hypo and hyperglycemia - how to identify and how to normalize BG. He reports doing better with ADLs at home - will get worse if he moves too quickly or carries things that are too heavy. Swan is officially two years out from qutting smoking.  His breathing is getting better.  His sugars have been doing well overall.  His pressures continue to do well for him. Josejuan reports taking all medications and monitoring blood  sugars at home. His weight has been steady and he manages his SOB with O2 as needed depending on temperature and air pressure.    Expected Outcomes Short: Attend [facility] regularly to improve shortness of breath with ADL's.  Long: maintain independence with ADL's Short: Attend [facility] regularly to improve shortness of breath with ADL's, use O2 as prescribed, continue with management of BG (limiting simple sugars) Long: maintain independence with ADL's Short: Continue to work on breathing  Long: Continue to manage risk factors. Short: continue to work on purse lip breathing to manage SOB and continue to take all medications as prescribed. Long: Continue heart healthy lifestyle to manage cardiac risk factors.           Core Components/Risk Factors/Patient Goals at Discharge (Final Review):   Goals and Risk Factor Review - 12/26/20 1123      Core Components/Risk Factors/Patient Goals Review   Personal Goals Review Improve shortness of breath with ADL's;Diabetes;Weight Management/Obesity;Hypertension    Review Stefano reports taking all medications and monitoring blood sugars at home. His weight has been steady and he manages his SOB with O2 as needed depending on temperature and air pressure.    Expected Outcomes Short: continue to work on purse lip breathing to manage SOB and continue to take all medications as prescribed. Long: Continue heart healthy lifestyle to manage cardiac risk factors.           ITP Comments:  ITP Comments    Row Name 10/19/20 0717 11/16/20 1043 12/14/20 0632 01/11/21 0945 01/11/21 1202   ITP Comments 30 Day review completed. Medical Director ITP review done, changes made as directed, and signed approval by Medical Director. 30 Day review completed. Medical Director ITP review done, changes made as directed, and signed approval by Medical Director. 30 Day review completed. Medical Director ITP review done, changes made as directed, and signed approval by Medical Director. 30 Day review completed. Medical Director ITP review done, changes made as directed, and signed approval by Medical Director. Kethan has been out with sick - diagnosed with COVID 01/04/21. Shae has been admitted to the hospital  with COVID pneumonia and respitory failure due to COVID.   Row Name 01/23/21 1002 02/08/21 0704 02/08/21 1356 02/15/21 1514 02/22/21 1025   ITP Comments Still admitted with potential d/c today. 30 Day review completed. Medical Director ITP review done, changes made as directed, and signed approval by Medical Director. Demontae has not attended since last review.  He is currently back in the hospital for continued respiratory issues. Called to check on pt.  He has been in hospital in and out with COVID and respiratory failure.  He is still feeling pretty out of it and very fatigue.  He is staying out of the public for right now. He is not quite ready to come back yet and wants to hold out for two weeks. He did sign a DNR yesterday. Ronn is supposed to return next week.   Row Name 03/08/21 1013 03/21/21 0823 04/05/21 0625       ITP Comments 30 Day review completed. Medical Director ITP review done, changes made as directed, and signed approval by Medical Director.  Has not returned yet Leelan saw his pulmonologist yesterday and they want him back in rehab as soon as he finishes up with home PT.  We will touch base with him again in two weeks if we have not heard from him before then. 30 Day review completed. Medical Director ITP review done, changes made  as directed, and signed approval by Wellsite geologist. remains out for medical reasons            Comments:

## 2021-04-17 ENCOUNTER — Telehealth: Payer: Self-pay | Admitting: Pulmonary Disease

## 2021-04-17 MED ORDER — PREDNISONE 10 MG (21) PO TBPK: ORAL_TABLET | ORAL | 0 refills | Status: DC

## 2021-04-17 MED ORDER — DOXYCYCLINE HYCLATE 100 MG PO TABS: 100.0000 mg | ORAL_TABLET | Freq: Two times a day (BID) | ORAL | 0 refills | Status: DC

## 2021-04-17 NOTE — Telephone Encounter (Signed)
Lets call in a Prednisone taper pack of 21 tabs and Doxycycline 100 mg PO BID x 10 days

## 2021-04-17 NOTE — Telephone Encounter (Signed)
Called and spoke to patient.  Patient reports of chest heaviness, prod cough with light green sputum, increased sob with exertion, sight headache and chest congestion x4d.  Denied fever, chills or sweats.  He is using albuterol 1-2x daily, perforomist BID, pulmicort BID and Yupelri daily with some relief in sx. He has had two covid vaccines and flu shot.   Dr. Jayme Cloud, please advise. Thanks

## 2021-04-17 NOTE — Telephone Encounter (Signed)
Rx for prednisone has doxy has been sent to preferred pharmacy.  Patient is aware and voiced his understanding.  Nothing further is needed at this time.

## 2021-04-24 ENCOUNTER — Telehealth: Payer: Self-pay | Admitting: *Deleted

## 2021-04-24 ENCOUNTER — Encounter: Payer: Self-pay | Admitting: *Deleted

## 2021-04-24 DIAGNOSIS — J449 Chronic obstructive pulmonary disease, unspecified: Secondary | ICD-10-CM

## 2021-04-24 NOTE — Telephone Encounter (Signed)
Called to check on Harold Parker. He is still recovering and getting PT.  He plans to return on 05/08/21.  I did tell him that if we go beyond this month, we will need to discharge and get a new referral.  He stated understanding.

## 2021-05-02 ENCOUNTER — Ambulatory Visit (INDEPENDENT_AMBULATORY_CARE_PROVIDER_SITE_OTHER): Payer: Medicare Other | Admitting: Adult Health

## 2021-05-02 ENCOUNTER — Other Ambulatory Visit: Payer: Self-pay

## 2021-05-02 ENCOUNTER — Encounter: Payer: Self-pay | Admitting: Adult Health

## 2021-05-02 VITALS — BP 120/56 | HR 68 | Temp 97.1°F | Ht 64.0 in | Wt 173.4 lb

## 2021-05-02 DIAGNOSIS — J449 Chronic obstructive pulmonary disease, unspecified: Secondary | ICD-10-CM

## 2021-05-02 DIAGNOSIS — I5022 Chronic systolic (congestive) heart failure: Secondary | ICD-10-CM

## 2021-05-02 DIAGNOSIS — J9611 Chronic respiratory failure with hypoxia: Secondary | ICD-10-CM

## 2021-05-02 DIAGNOSIS — R918 Other nonspecific abnormal finding of lung field: Secondary | ICD-10-CM

## 2021-05-02 DIAGNOSIS — R911 Solitary pulmonary nodule: Secondary | ICD-10-CM | POA: Diagnosis not present

## 2021-05-02 NOTE — Assessment & Plan Note (Signed)
Appears euvolemic on exam continue current regimen 

## 2021-05-02 NOTE — Patient Instructions (Addendum)
Set up Overnight Oximetry test on 3l/m to see if this is adequate.  Decrease Oxygen 2l/m.at rest and 4l/m with activity and bedtime  Continue on Yupelri , Budesonide and Perforomist Neb  Activity as tolerated.  CT chest in late June .  Follow up with Dr. Jayme Cloud in 3 months and  As needed

## 2021-05-02 NOTE — Assessment & Plan Note (Signed)
CT chest February 2022 during acute illness.  That showed new scattered pulmonary nodules largest measuring 1.9 cm.  Will need a follow-up CT scan.  Plan Patient Instructions  Set up Overnight Oximetry test on 3l/m to see if this is adequate.  Decrease Oxygen 2l/m.at rest and 4l/m with activity and bedtime  Continue on Yupelri , Budesonide and Perforomist Neb  Activity as tolerated.  CT chest in late June .  Follow up with Dr. Jayme Cloud in 3 months and  As needed

## 2021-05-02 NOTE — Progress Notes (Signed)
@Patient  ID: Harold Parker, male    DOB: August 13, 1946, 75 y.o.   MRN: [id number]  Chief Complaint  Patient presents with  . Follow-up    Referring provider: [id number], MD  HPI: 75 year old male former smoker followed for severe COPD and chronic respiratory failure on oxygen COVID-19 infection January 2022 requiring hospitalization complicated by COVID-pneumonia and worsening acute on chronic respiratory failure Medical history significant for congestive heart failure and pulmonary hypertension   TEST/EVENTS :   05/02/2021 Follow up : COPD and O2 RF  Patient returns for a 1 month follow-up.  Patient has underlying severe COPD and oxygen dependent respiratory failure.  Had a prolonged hospitalization in January and February of this year due to severe COVID-19 infection complicated by COVID-pneumonia.  Patient says he has been slowly improving at home.  Feels that his energy level is starting to improve.  He continues to do home physical therapy.  He is going to return back to pulmonary rehab soon.  He remains on Yupelri daily and Budesonide /Perforomist Neb  Twice daily   CT chest February 19 showed no PE.  Patchy areas of airspace density in the left lung base.  And scattered pulmonary nodules that were new from previous exam.  Largest measuring 1.9 cm in the left lower lobe., Prominent mediastinal and hilar lymph nodes. Prior to COVID-19 patient was on oxygen at 2 L.  Now he is on oxygen 3 L at rest and 4 L with activity.  He does have a portable oxygen concentrator that does continuous flow at 2 L and pulsed oxygen up to 6 L.  He also has a home concentrator that does continuous flow.  Patient says his oxygen levels have been improving. Today in the office patient was able to maintain O2 saturations greater than 88 to 90% on 2 L at rest.  Required 4 L of oxygen walking to maintain O2 sats greater than 88 to 90%.  Patient says he has some trips planned for the summer and is looking  forward to traveling.      Allergies  Allergen Reactions  . Atorvastatin Other (See Comments)    Leg aches and weakness     Immunization History  Administered Date(s) Administered  . Influenza Inj Mdck Quad Pf 12/02/2017  . Influenza Split 12/11/2014, 11/01/2019  . Influenza, High Dose Seasonal PF 09/16/2018, 08/21/2019, 11/01/2020  . PFIZER(Purple Top)SARS-COV-2 Vaccination 02/06/2020, 02/29/2020    Past Medical History:  Diagnosis Date  . Bilateral carotid bruits    a. 01/2018 U/S: < 50% bilat ICA stenosis.  02/2018 CAD (coronary artery disease)    a. 1998 s/p MI and BMS [facility], [facility]); b. 1999 redo PCI/rotablator in setting of what sounds like ISR;  c. Multiple stress tests over the years - last ~ 2017, reportedly nl; d. 01/2018 NSTEMI/Cath: LM 60m/d, LAD 50p, 40p/m, D1 60ost, OM1 95, RCA 100ost/p w/ L->R collats, EF 45%; e. s/p 3V CABG 02/19/18 (LIMA-LAD, VG-D1, VG-OM)  . Chronic lower back pain   . COPD (chronic obstructive pulmonary disease) (HCC)   . GIB (gastrointestinal bleeding)    a. 02/2018 GIB and anemia w/ Hgb of 4.7 on presentation; b. 03/2018 EGD: 2 nonbleeding duodenal ulcers.  07-29-1988 HTN (hypertension)   . Hypercholesteremia   . Ischemic cardiomyopathy    a. 01/2018 Echo: EF 40-45%, mid-apicalanteroseptal, ant, apical sev HK, mod apicalinferior HK. Gr2 DD. Mod AS, mild MR, mod dil LA, PASP 02/2018.  . Moderate aortic stenosis    a. 01/2018  Echo: Mod AS, mean grad (S) , Valve area (VTI) 1.06 cm^2, (Vmax) 1.27 cm^2; b. 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  . Myocardial infarction (HCC) ~ 1998/1999  . 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 02/19/2018 AVR: 25 mm Edwards Inspiris Resilia stented bovine pericardial tissue valve  . 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 Tobacco abuse   . Type II diabetes mellitus (HCC)     Tobacco History: Social History   Tobacco Use  Smoking Status Former  Smoker  . Packs/day: 1.00  . Years: 40.00  . Pack years: 40.00  . Types: Cigarettes  . Quit date: 02/02/2018  . Years since quitting: 3.2  Smokeless Tobacco Never Used  Tobacco Comment   Quit 01/2018 - on 05/01/2018 talked to Karson about Relaspe concerns. He ststaes that he has no taste for tobacco since he quit in Feb.   Counseling given: Not Answered Comment: Quit 01/2018 - on 05/01/2018 talked to Tyrian about Relaspe concerns. He ststaes that he has no taste for tobacco since he quit in Feb.   Outpatient Medications Prior to Visit  Medication Sig Dispense Refill  . acetaminophen (TYLENOL) 500 MG tablet Take 1,000 mg by mouth daily as needed for moderate pain or headache.    . albuterol (PROVENTIL) (2.5 MG/3ML) 0.083% nebulizer solution Take 3 mLs (2.5 mg total) by nebulization every 6 (six) hours as needed for wheezing or shortness of breath. 75 mL 12  . ALPRAZolam (XANAX) 0.5 MG tablet Take 0.5 mg by mouth 2 (two) times daily.    Marland Kitchen aspirin EC 81 MG tablet Take 81 mg by mouth daily.    . budesonide (PULMICORT) 0.5 MG/2ML nebulizer solution Take 2 mLs (0.5 mg total) by nebulization 2 (two) times daily. Use AFTER Perforomist 120 mL 11  . busPIRone (BUSPAR) 10 MG tablet Take 10 mg by mouth 2 (two) times daily.    Marland Kitchen dextromethorphan-guaiFENesin (ROBITUSSIN-DM) 10-100 MG/5ML liquid Take 30 mLs by mouth every 4 (four) hours as needed for cough.    . esomeprazole (NEXIUM) 40 MG capsule Take 40 mg by mouth at bedtime.     Marland Kitchen ezetimibe (ZETIA) 10 MG tablet Take 10 mg by mouth at bedtime.     . folic acid (FOLVITE) 400 MCG tablet Take 400 mcg by mouth every evening.    . formoterol (PERFOROMIST) 20 MCG/2ML nebulizer solution Take 2 mLs (20 mcg total) by nebulization 2 (two) times daily. 120 mL 11  . furosemide (LASIX) 40 MG tablet TAKE 1 TABLET BY MOUTH EVERY DAY 90 tablet 3  . glipiZIDE (GLUCOTROL) 5 MG tablet Take 0.5 tablets (2.5 mg total) by mouth daily before breakfast. (Patient taking  differently: Take 5 mg by mouth See admin instructions. Take 5 mg in the morning and take a second 5 mg dose at night on Mon, Wed, and Fri) 30 tablet 1  . guaiFENesin (MUCINEX) 600 MG 12 hr tablet Take 1,200 mg by mouth every 12 (twelve) hours as needed.    Marland Kitchen losartan (COZAAR) 100 MG tablet Take 100 mg by mouth daily.    . Melatonin 10 MG CAPS Take 10 mg by mouth at bedtime.    . metFORMIN (GLUCOPHAGE) 1000 MG tablet TAKE 1 TABLET BY MOUTH TWICE DAILY WITH A MEAL (Patient taking differently: Take 1,000 mg by mouth 2 (two) times daily.) 60 tablet 0  . metoprolol succinate (TOPROL-XL) 100  MG 24 hr tablet TAKE 1 TABLET BY MOUTH EVERY DAY WITH OR IMMEDIATELY FOLLOWING A MEAL 90 tablet 1  . predniSONE (STERAPRED UNI-PAK 21 TAB) 10 MG (21) TBPK tablet Use as directed. 21 tablet 0  . PROAIR HFA 108 (90 Base) MCG/ACT inhaler Inhale 1 puff into the lungs every 4 (four) hours as needed for wheezing or shortness of breath.    . revefenacin (YUPELRI) 175 MCG/3ML nebulizer solution Take 3 mLs (175 mcg total) by nebulization daily. Can be mixed with Perforomist 90 mL 11  . sucralfate (CARAFATE) 1 GM/10ML suspension Take 10 mLs (1 g total) by mouth 4 (four) times daily -  with meals and at bedtime. 420 mL 0  . traZODone (DESYREL) 50 MG tablet Take 50 mg by mouth at bedtime as needed.    . Vitamin D, Cholecalciferol, 1000 units TABS Take 1,000 Units by mouth every morning.     . busPIRone (BUSPAR) 5 MG tablet Take 2 tablets (10 mg total) by mouth 2 (two) times daily. (Patient not taking: Reported on 05/02/2021)    . doxycycline (VIBRA-TABS) 100 MG tablet Take 1 tablet (100 mg total) by mouth 2 (two) times daily. (Patient not taking: Reported on 05/02/2021) 20 tablet 0  . predniSONE (DELTASONE) 20 MG tablet Take 1 tablet (20 mg total) by mouth daily with breakfast. (Patient not taking: Reported on 05/02/2021) 7 tablet 0   No facility-administered medications prior to visit.     Review of Systems:   Constitutional:    No  weight loss, night sweats,  Fevers, chills, + fatigue, or  lassitude.  HEENT:   No headaches,  Difficulty swallowing,  Tooth/dental problems, or  Sore throat,                No sneezing, itching, ear ache, + nasal congestion, post nasal drip,   CV:  No chest pain,  Orthopnea, PND, swelling in lower extremities, anasarca, dizziness, palpitations, syncope.   GI  No heartburn, indigestion, abdominal pain, nausea, vomiting, diarrhea, change in bowel habits, loss of appetite, bloody stools.   Resp:    No chest wall deformity  Skin: no rash or lesions.  GU: no dysuria, change in color of urine, no urgency or frequency.  No flank pain, no hematuria   MS:  No joint pain or swelling.  No decreased range of motion.  No back pain.    Physical Exam  BP (!) 120/56 (BP Location: Left Arm, Patient Position: Sitting, Cuff Size: Normal)   Pulse 68   Temp (!) 97.1 F (36.2 C) (Temporal)   Ht 5\' 4"  (1.626 m)   Wt 173 lb 6.4 oz (78.7 kg)   SpO2 90%   BMI 29.76 kg/m   GEN: A/Ox3; pleasant , NAD, elderly on O2    HEENT:  [place]/AT,   NOSE-clear, THROAT-clear, no lesions, no postnasal drip or exudate noted.   NECK:  Supple w/ fair ROM; no JVD; normal carotid impulses w/o bruits; no thyromegaly or nodules palpated; no lymphadenopathy.    RESP  Clear  P & A; w/o, wheezes/ rales/ or rhonchi. no accessory muscle use, no dullness to percussion  CARD:  RRR, no m/r/g, tr  peripheral edema, pulses intact, no cyanosis or clubbing.  GI:   Soft & nt; nml bowel sounds; no organomegaly or masses detected.   Musco: Warm bil, no deformities or joint swelling noted.   Neuro: alert, no focal deficits noted.    Skin: Warm, no lesions or rashes  Lab Results:  BMET   ProBNP No results found for: PROBNP  Imaging: No results found.    PFT Results Latest Ref Rng & Units 02/12/2018  FVC-Pre L 1.58  FVC-Predicted Pre % 45  FVC-Post L 1.83  FVC-Predicted Post % 52  Pre FEV1/FVC % % 59  Post  FEV1/FCV % % 52  FEV1-Pre L 0.93  FEV1-Predicted Pre % 37  FEV1-Post L 0.95    No results found for: NITRICOXIDE      Assessment & Plan:   COPD (chronic obstructive pulmonary disease) (HCC) Severe COPD currently stable on present regimen-patient is making clinical improvement since his COVID-19 infection earlier this year. Needs follow-up CT scan.  Plan  Patient Instructions  Set up Overnight Oximetry test on 3l/m to see if this is adequate.  Decrease Oxygen 2l/m.at rest and 4l/m with activity and bedtime  Continue on Yupelri , Budesonide and Perforomist Neb  Activity as tolerated.  CT chest in late June .  Follow up with Dr. Jayme Cloud in 3 months and  As needed         Chronic systolic CHF (congestive heart failure) (HCC) Appears euvolemic on exam continue current regimen  Chronic respiratory failure with hypoxia (HCC) Oxygen demands are decreasing.  Patient may decrease oxygen to 2 L at rest and 4 L with activity.  Goal is to maintain O2 saturations greater than 88 to 90%.  We will set up for overnight oximetry test on 3 L to see if he can decrease his oxygen down to 3 L.  Plan Patient Instructions  Set up Overnight Oximetry test on 3l/m to see if this is adequate.  Decrease Oxygen 2l/m.at rest and 4l/m with activity and bedtime  Continue on Yupelri , Budesonide and Perforomist Neb  Activity as tolerated.  CT chest in late June .  Follow up with Dr. Jayme Cloud in 3 months and  As needed         Pulmonary nodules CT chest February 2022 during acute illness.  That showed new scattered pulmonary nodules largest measuring 1.9 cm.  Will need a follow-up CT scan.  Plan Patient Instructions  Set up Overnight Oximetry test on 3l/m to see if this is adequate.  Decrease Oxygen 2l/m.at rest and 4l/m with activity and bedtime  Continue on Yupelri , Budesonide and Perforomist Neb  Activity as tolerated.  CT chest in late June .  Follow up with Dr. Jayme Cloud in 3 months  and  As needed            Rubye Oaks, NP 05/02/2021

## 2021-05-02 NOTE — Assessment & Plan Note (Signed)
Oxygen demands are decreasing.  Patient may decrease oxygen to 2 L at rest and 4 L with activity.  Goal is to maintain O2 saturations greater than 88 to 90%.  We will set up for overnight oximetry test on 3 L to see if he can decrease his oxygen down to 3 L.  Plan Patient Instructions  Set up Overnight Oximetry test on 3l/m to see if this is adequate.  Decrease Oxygen 2l/m.at rest and 4l/m with activity and bedtime  Continue on Yupelri , Budesonide and Perforomist Neb  Activity as tolerated.  CT chest in late June .  Follow up with Dr. Jayme Cloud in 3 months and  As needed

## 2021-05-02 NOTE — Assessment & Plan Note (Signed)
Severe COPD currently stable on present regimen-patient is making clinical improvement since his COVID-19 infection earlier this year. Needs follow-up CT scan.  Plan  Patient Instructions  Set up Overnight Oximetry test on 3l/m to see if this is adequate.  Decrease Oxygen 2l/m.at rest and 4l/m with activity and bedtime  Continue on Yupelri , Budesonide and Perforomist Neb  Activity as tolerated.  CT chest in late June .  Follow up with Dr. Jayme Cloud in 3 months and  As needed

## 2021-05-03 ENCOUNTER — Encounter: Payer: Self-pay | Admitting: *Deleted

## 2021-05-03 DIAGNOSIS — J449 Chronic obstructive pulmonary disease, unspecified: Secondary | ICD-10-CM

## 2021-05-03 NOTE — Progress Notes (Signed)
Pulmonary Individual Treatment Plan  Patient Details  Name: Harold Parker MRN: [id number] Date of Birth: 07-24-1946 Referring Provider:   Flowsheet Row Pulmonary Rehab from 09/13/2020 in [facility] Cardiac and Pulmonary Rehab  Referring Provider Jayme Cloud      Initial Encounter Date:  Flowsheet Row Pulmonary Rehab from 09/13/2020 in [facility] Cardiac and Pulmonary Rehab  Date 09/13/20      Visit Diagnosis: Chronic obstructive pulmonary disease, unspecified COPD type (HCC)  Patient's Home Medications on Admission:  Current Outpatient Medications:  .  acetaminophen (TYLENOL) 500 MG tablet, Take 1,000 mg by mouth daily as needed for moderate pain or headache., Disp: , Rfl:  .  albuterol (PROVENTIL) (2.5 MG/3ML) 0.083% nebulizer solution, Take 3 mLs (2.5 mg total) by nebulization every 6 (six) hours as needed for wheezing or shortness of breath., Disp: 75 mL, Rfl: 12 .  ALPRAZolam (XANAX) 0.5 MG tablet, Take 0.5 mg by mouth 2 (two) times daily., Disp: , Rfl:  .  aspirin EC 81 MG tablet, Take 81 mg by mouth daily., Disp: , Rfl:  .  budesonide (PULMICORT) 0.5 MG/2ML nebulizer solution, Take 2 mLs (0.5 mg total) by nebulization 2 (two) times daily. Use AFTER Perforomist, Disp: 120 mL, Rfl: 11 .  busPIRone (BUSPAR) 10 MG tablet, Take 10 mg by mouth 2 (two) times daily., Disp: , Rfl:  .  busPIRone (BUSPAR) 5 MG tablet, Take 2 tablets (10 mg total) by mouth 2 (two) times daily. (Patient not taking: Reported on 05/02/2021), Disp: , Rfl:  .  dextromethorphan-guaiFENesin (ROBITUSSIN-DM) 10-100 MG/5ML liquid, Take 30 mLs by mouth every 4 (four) hours as needed for cough., Disp: , Rfl:  .  doxycycline (VIBRA-TABS) 100 MG tablet, Take 1 tablet (100 mg total) by mouth 2 (two) times daily. (Patient not taking: Reported on 05/02/2021), Disp: 20 tablet, Rfl: 0 .  esomeprazole (NEXIUM) 40 MG capsule, Take 40 mg by mouth at bedtime. , Disp: , Rfl:  .  ezetimibe (ZETIA) 10 MG tablet, Take 10 mg by mouth at bedtime. , Disp:  , Rfl:  .  folic acid (FOLVITE) 400 MCG tablet, Take 400 mcg by mouth every evening., Disp: , Rfl:  .  formoterol (PERFOROMIST) 20 MCG/2ML nebulizer solution, Take 2 mLs (20 mcg total) by nebulization 2 (two) times daily., Disp: 120 mL, Rfl: 11 .  furosemide (LASIX) 40 MG tablet, TAKE 1 TABLET BY MOUTH EVERY DAY, Disp: 90 tablet, Rfl: 3 .  glipiZIDE (GLUCOTROL) 5 MG tablet, Take 0.5 tablets (2.5 mg total) by mouth daily before breakfast. (Patient taking differently: Take 5 mg by mouth See admin instructions. Take 5 mg in the morning and take a second 5 mg dose at night on Mon, Wed, and Fri), Disp: 30 tablet, Rfl: 1 .  guaiFENesin (MUCINEX) 600 MG 12 hr tablet, Take 1,200 mg by mouth every 12 (twelve) hours as needed., Disp: , Rfl:  .  losartan (COZAAR) 100 MG tablet, Take 100 mg by mouth daily., Disp: , Rfl:  .  Melatonin 10 MG CAPS, Take 10 mg by mouth at bedtime., Disp: , Rfl:  .  metFORMIN (GLUCOPHAGE) 1000 MG tablet, TAKE 1 TABLET BY MOUTH TWICE DAILY WITH A MEAL (Patient taking differently: Take 1,000 mg by mouth 2 (two) times daily.), Disp: 60 tablet, Rfl: 0 .  metoprolol succinate (TOPROL-XL) 100 MG 24 hr tablet, TAKE 1 TABLET BY MOUTH EVERY DAY WITH OR IMMEDIATELY FOLLOWING A MEAL, Disp: 90 tablet, Rfl: 1 .  predniSONE (DELTASONE) 20 MG tablet, Take 1 tablet (20  mg total) by mouth daily with breakfast. (Patient not taking: Reported on 05/02/2021), Disp: 7 tablet, Rfl: 0 .  predniSONE (STERAPRED UNI-PAK 21 TAB) 10 MG (21) TBPK tablet, Use as directed., Disp: 21 tablet, Rfl: 0 .  PROAIR HFA 108 (90 Base) MCG/ACT inhaler, Inhale 1 puff into the lungs every 4 (four) hours as needed for wheezing or shortness of breath., Disp: , Rfl:  .  revefenacin (YUPELRI) 175 MCG/3ML nebulizer solution, Take 3 mLs (175 mcg total) by nebulization daily. Can be mixed with Perforomist, Disp: 90 mL, Rfl: 11 .  sucralfate (CARAFATE) 1 GM/10ML suspension, Take 10 mLs (1 g total) by mouth 4 (four) times daily -  with  meals and at bedtime., Disp: 420 mL, Rfl: 0 .  traZODone (DESYREL) 50 MG tablet, Take 50 mg by mouth at bedtime as needed., Disp: , Rfl:  .  Vitamin D, Cholecalciferol, 1000 units TABS, Take 1,000 Units by mouth every morning. , Disp: , Rfl:   Past Medical History: Past Medical History:  Diagnosis Date  . Bilateral carotid bruits    a. 01/2018 U/S: < 50% bilat ICA stenosis.  Marland Kitchen. CAD (coronary artery disease)    a. 1998 s/p MI and BMS [facility]([place], [place]); b. 1999 redo PCI/rotablator in setting of what sounds like ISR;  c. Multiple stress tests over the years - last ~ 2017, reportedly nl; d. 01/2018 NSTEMI/Cath: LM 1511m/d, LAD 50p, 40p/m, D1 60ost, OM1 95, RCA 100ost/p w/ L->R collats, EF 45%; e. s/p 3V CABG 02/19/18 (LIMA-LAD, VG-D1, VG-OM)  . Chronic lower back pain   . COPD (chronic obstructive pulmonary disease) (HCC)   . GIB (gastrointestinal bleeding)    a. 02/2018 GIB and anemia w/ Hgb of 4.7 on presentation; b. 03/2018 EGD: 2 nonbleeding duodenal ulcers.  Marland Kitchen. HTN (hypertension)   . Hypercholesteremia   . Ischemic cardiomyopathy    a. 01/2018 Echo: EF 40-45%, mid-apicalanteroseptal, ant, apical sev HK, mod apicalinferior HK. Gr2 DD. Mod AS, mild MR, mod dil LA, PASP 50mmHg.  . Moderate aortic stenosis    a. 01/2018 Echo: Mod AS, mean grad (S) 20mmHg, Valve area (VTI) 1.06 cm^2, (Vmax) 1.27 cm^2; b. 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  . Myocardial infarction (HCC) ~ 1998/1999  . 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 02/19/2018 AVR: 25 mm Edwards Inspiris Resilia stented bovine pericardial tissue valve  . 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 Tobacco abuse   . Type II diabetes mellitus (HCC)     Tobacco Use: Social History   Tobacco Use  Smoking Status Former Smoker  . Packs/day: 1.00  . Years: 40.00  . Pack years: 40.00  . Types: Cigarettes  . Quit date: 02/02/2018  . Years since quitting: 3.2  Smokeless  Tobacco Never Used  Tobacco Comment   Quit 01/2018 - on 05/01/2018 talked to Rayna SextonRalph about Relaspe concerns. He ststaes that he has no taste for tobacco since he quit in Feb.    Labs: Recent Review Flowsheet Data    Labs for ITP Cardiac and Pulmonary Rehab Latest Ref Rng & Units 02/20/2018 03/27/2018 01/11/2021 01/12/2021 02/06/2021   Cholestrol 0 - 200 mg/dL - 161102 - 90 -   LDLCALC 0 - 99 mg/dL - 50 - 45 -   HDL >09>40 mg/dL - 60(A35(L) - 54(U28(L) -   Trlycerides <150 mg/dL - 84 55 87 -   Hemoglobin A1c  4.8 - 5.6 % - - - 7.5(H) -   PHART 7.350 - 7.450 7.331(L) - - - 7.47(H)   PCO2ART 32.0 - 48.0 mmHg 41.0 - - - 36   HCO3 20.0 - 28.0 mmol/L 21.5 - - - 26.2   TCO2 22 - 32 mmol/L 23 - - - -   ACIDBASEDEF 0.0 - 2.0 mmol/L 4.0(H) - - - -   O2SAT % 95.0 - - - 92.6       Pulmonary Assessment Scores:   UCSD: Self-administered rating of dyspnea associated with activities of daily living (ADLs) 6-point scale (0 = "not at all" to 5 = "maximal or unable to do because of breathlessness")  Scoring Scores range from 0 to 120.  Minimally important difference is 5 units  CAT: CAT can identify the health impairment of COPD patients and is better correlated with disease progression.  CAT has a scoring range of zero to 40. The CAT score is classified into four groups of low (less than 10), medium (10 - 20), high (21-30) and very high (31-40) based on the impact level of disease on health status. A CAT score over 10 suggests significant symptoms.  A worsening CAT score could be explained by an exacerbation, poor medication adherence, poor inhaler technique, or progression of COPD or comorbid conditions.  CAT MCID is 2 points  mMRC: mMRC (Modified Medical Research Council) Dyspnea Scale is used to assess the degree of baseline functional disability in patients of respiratory disease due to dyspnea. No minimal important difference is established. A decrease in score of 1 point or greater is considered a positive change.    Pulmonary Function Assessment:   Exercise Target Goals: Exercise Program Goal: Individual exercise prescription set using results from initial 6 min walk test and THRR while considering  patient's activity barriers and safety.   Exercise Prescription Goal: Initial exercise prescription builds to 30-45 minutes a day of aerobic activity, 2-3 days per week.  Home exercise guidelines will be given to patient during program as part of exercise prescription that the participant will acknowledge.  Education: Aerobic Exercise: - Group verbal and visual presentation on the components of exercise prescription. Introduces F.I.T.T principle from ACSM for exercise prescriptions.  Reviews F.I.T.T. principles of aerobic exercise including progression. Written material given at graduation. Flowsheet Row Cardiac Rehab from 09/02/2018 in [facility] Cardiac and Pulmonary Rehab  Date 07/24/18  Educator AS  Instruction Review Code 1- Verbalizes Understanding      Education: Resistance Exercise: - Group verbal and visual presentation on the components of exercise prescription. Introduces F.I.T.T principle from ACSM for exercise prescriptions  Reviews F.I.T.T. principles of resistance exercise including progression. Written material given at graduation.    Education: Exercise & Equipment Safety: - Individual verbal instruction and demonstration of equipment use and safety with use of the equipment. Flowsheet Row Pulmonary Rehab from 09/21/2020 in [facility] Cardiac and Pulmonary Rehab  Date 09/13/20  Educator AS  Instruction Review Code 1- Verbalizes Understanding      Education: Exercise Physiology & General Exercise Guidelines: - Group verbal and written instruction with models to review the exercise physiology of the cardiovascular system and associated critical values. Provides general exercise guidelines with specific guidelines to those with heart or lung disease.  Flowsheet Row Cardiac Rehab from 09/02/2018  in [facility] Cardiac and Pulmonary Rehab  Date 05/20/18  Educator AS  Instruction Review Code 4- No Evidence of Learning  [charting in error]      Education: Flexibility, Balance, Mind/Body  Relaxation: - Group verbal and visual presentation with interactive activity on the components of exercise prescription. Introduces F.I.T.T principle from ACSM for exercise prescriptions. Reviews F.I.T.T. principles of flexibility and balance exercise training including progression. Also discusses the mind body connection.  Reviews various relaxation techniques to help reduce and manage stress (i.e. Deep breathing, progressive muscle relaxation, and visualization). Balance handout provided to take home. Written material given at graduation. Flowsheet Row Cardiac Rehab from 09/02/2018 in [facility] Cardiac and Pulmonary Rehab  Date 07/29/18  Educator AS  Instruction Review Code 1- Verbalizes Understanding      Activity Barriers & Risk Stratification:   6 Minute Walk:  Oxygen Initial Assessment:   Oxygen Re-Evaluation:  Oxygen Re-Evaluation    Row Name 11/30/20 1118 12/21/20 1127           Program Oxygen Prescription   Program Oxygen Prescription Continuous;E-Tanks Continuous      Liters per minute 3 3             Home Oxygen   Home Oxygen Device Home Concentrator;Portable Concentrator Home Concentrator;Portable Concentrator;E-Tanks      Sleep Oxygen Prescription Continuous Continuous      Liters per minute 2 2      Home Exercise Oxygen Prescription Continuous Continuous      Liters per minute 2 2      Home Resting Oxygen Prescription Continuous Continuous      Liters per minute 2 2  as Needed      Compliance with Home Oxygen Use Yes Yes             Goals/Expected Outcomes   Short Term Goals To learn and understand importance of maintaining oxygen saturations>88%;To learn and understand importance of monitoring SPO2 with pulse oximeter and demonstrate accurate use of the pulse oximeter.;To learn  and exhibit compliance with exercise, home and travel O2 prescription;To learn and demonstrate proper pursed lip breathing techniques or other breathing techniques.;To learn and demonstrate proper use of respiratory medications To learn and understand importance of monitoring SPO2 with pulse oximeter and demonstrate accurate use of the pulse oximeter.;To learn and understand importance of maintaining oxygen saturations>88%      Long  Term Goals Exhibits compliance with exercise, home and travel O2 prescription;Verbalizes importance of monitoring SPO2 with pulse oximeter and return demonstration;Maintenance of O2 saturations>88%;Exhibits proper breathing techniques, such as pursed lip breathing or other method taught during program session;Compliance with respiratory medication;Demonstrates proper use of MDI's Maintenance of O2 saturations>88%;Verbalizes importance of monitoring SPO2 with pulse oximeter and return demonstration      Comments Doug is staying compliant with his oxygen and his breathing is getting better.  He continues to monitor his saturations and doing well with his breathing and using his PLB. Larsen states he is going to use his oxygen when walking in to rehab. Informed him that his oxygen could be getting to low without it. He has a wrist band that monitors his oxygen and knows he needs to be 88 percent and above. He has no questions about his medications.      Goals/Expected Outcomes Short: Continue to breathing better  Long; Continue to improve compliance Short: wear oxygen into rehab. Long: maintain oxygen saturations of 88 percent and above independently.             Oxygen Discharge (Final Oxygen Re-Evaluation):  Oxygen Re-Evaluation - 12/21/20 1127      Program Oxygen Prescription   Program Oxygen Prescription Continuous    Liters per minute 3  Home Oxygen   Home Oxygen Device Home Concentrator;Portable Concentrator;E-Tanks    Sleep Oxygen Prescription Continuous     Liters per minute 2    Home Exercise Oxygen Prescription Continuous    Liters per minute 2    Home Resting Oxygen Prescription Continuous    Liters per minute 2   as Needed   Compliance with Home Oxygen Use Yes      Goals/Expected Outcomes   Short Term Goals To learn and understand importance of monitoring SPO2 with pulse oximeter and demonstrate accurate use of the pulse oximeter.;To learn and understand importance of maintaining oxygen saturations>88%    Long  Term Goals Maintenance of O2 saturations>88%;Verbalizes importance of monitoring SPO2 with pulse oximeter and return demonstration    Comments Ruven states he is going to use his oxygen when walking in to rehab. Informed him that his oxygen could be getting to low without it. He has a wrist band that monitors his oxygen and knows he needs to be 88 percent and above. He has no questions about his medications.    Goals/Expected Outcomes Short: wear oxygen into rehab. Long: maintain oxygen saturations of 88 percent and above independently.           Initial Exercise Prescription:   Perform Capillary Blood Glucose checks as needed.  Exercise Prescription Changes:  Exercise Prescription Changes    Row Name 11/14/20 1600 11/30/20 1400 12/14/20 1000 12/26/20 1100       Response to Exercise   Blood Pressure (Admit) 138/70 118/60 140/64 142/80    Blood Pressure (Exercise) 142/76 130/70 138/58 138/64    Blood Pressure (Exit) 128/58 118/58 122/58 126/70    Heart Rate (Admit) 83 bpm 100 bpm 74 bpm 78 bpm    Heart Rate (Exercise) 89 bpm 888 bpm 87 bpm 78 bpm    Heart Rate (Exit) 87 bpm 81 bpm 78 bpm 64 bpm    Oxygen Saturation (Admit) 94 % 90 % 89 % 88 %    Oxygen Saturation (Exercise) 93 % 92 % 87 % 88 %    Oxygen Saturation (Exit) 92 % 95 % 92 % 98 %    Rating of Perceived Exertion (Exercise) 11 13 11 11     Perceived Dyspnea (Exercise) -- 2 0 1    Symptoms none SOB -- SOB    Duration Continue with 30 min of aerobic exercise  without signs/symptoms of physical distress. Continue with 30 min of aerobic exercise without signs/symptoms of physical distress. Continue with 30 min of aerobic exercise without signs/symptoms of physical distress. Continue with 30 min of aerobic exercise without signs/symptoms of physical distress.    Intensity THRR unchanged THRR unchanged THRR unchanged THRR unchanged         Progression   Progression Continue to progress workloads to maintain intensity without signs/symptoms of physical distress. Continue to progress workloads to maintain intensity without signs/symptoms of physical distress. Continue to progress workloads to maintain intensity without signs/symptoms of physical distress. Continue to progress workloads to maintain intensity without signs/symptoms of physical distress.    Average METs 2.15 2.4 1.9 2.2         Resistance Training   Training Prescription Yes Yes Yes Yes    Weight 5 lb 5 lb 5 lb 5 lb    Reps 10-15 10-15 10-15 10-15         Interval Training   Interval Training No No No No         Oxygen   Oxygen  Continuous Continuous Continuous Continuous    Liters 3 3 3 6   bad breathing day         NuStep   Level 4 6 6 4     SPM 80 -- -- --    Minutes 15 15 15 30     METs 2.15 2.4 1.9 2.2         Home Exercise Plan   Plans to continue exercise at -- Home (comment)  bike, walking Home (comment)  bike, walking Home (comment)  bike, walking    Frequency -- Add 2 additional days to program exercise sessions. Add 2 additional days to program exercise sessions. Add 2 additional days to program exercise sessions.    Initial Home Exercises Provided -- 11/02/20 11/02/20 11/02/20           Exercise Comments:   Exercise Goals and Review:   Exercise Goals Re-Evaluation :  Exercise Goals Re-Evaluation    Row Name 11/14/20 1642 11/30/20 1112 12/14/20 1006 12/26/20 1116 01/23/21 1003     Exercise Goal Re-Evaluation   Exercise Goals Review Increase Physical  Activity;Increase Strength and Stamina;Understanding of Exercise Prescription Increase Physical Activity;Increase Strength and Stamina;Understanding of Exercise Prescription Increase Physical Activity;Increase Strength and Stamina Increase Physical Activity;Increase Strength and Stamina --   Comments Canaan lowered level on T4 today.  Staff will continue to monitor progress. Yvan is doing well in rehab.  He is walking on his off days.  He is feeling stronger overall and better. Ceferino has moved up to level 6 on NS.  More consistent attendance would help him progress more. Gilles stated that he feels like he is making progress with his exercise and is getting stronger. He said he works at his own pace and feels like he is doing well. HR and SaO2 are maintained in acceptable ranges duing exercise. Currently admitted and out since last review   Expected Outcomes Short: get back up to previous levels Long: maintain consistent exercise Short: Continue to stay active  Long: Continue to improve stamina. Short:  attend at least twice per week Long: improve overall stamina Short: continue to attend pulmonary rehab at least 2 days a week. Long: Plans to exercise outside and be more active once the temperature increases which is easier on his breathing. --   Row Name 02/15/21 1514 02/22/21 1026 03/21/21 0824 04/24/21 1539       Exercise Goal Re-Evaluation   Comments Out since last review.  Currently home on medical hold for 2 weeks Out since last review, supposed to return next week. Out since last review, hold to finish home PT Out since last review, hold to finish home PT           Discharge Exercise Prescription (Final Exercise Prescription Changes):  Exercise Prescription Changes - 12/26/20 1100      Response to Exercise   Blood Pressure (Admit) 142/80    Blood Pressure (Exercise) 138/64    Blood Pressure (Exit) 126/70    Heart Rate (Admit) 78 bpm    Heart Rate (Exercise) 78 bpm    Heart Rate (Exit) 64  bpm    Oxygen Saturation (Admit) 88 %    Oxygen Saturation (Exercise) 88 %    Oxygen Saturation (Exit) 98 %    Rating of Perceived Exertion (Exercise) 11    Perceived Dyspnea (Exercise) 1    Symptoms SOB    Duration Continue with 30 min of aerobic exercise without signs/symptoms of physical distress.    Intensity THRR unchanged  Progression   Progression Continue to progress workloads to maintain intensity without signs/symptoms of physical distress.    Average METs 2.2      Resistance Training   Training Prescription Yes    Weight 5 lb    Reps 10-15      Interval Training   Interval Training No      Oxygen   Oxygen Continuous    Liters 6   bad breathing day     NuStep   Level 4    Minutes 30    METs 2.2      Home Exercise Plan   Plans to continue exercise at Home (comment)   bike, walking   Frequency Add 2 additional days to program exercise sessions.    Initial Home Exercises Provided 11/02/20           Nutrition:  Target Goals: Understanding of nutrition guidelines, daily intake of sodium 1500mg , cholesterol 200mg , calories 30% from fat and 7% or less from saturated fats, daily to have 5 or more servings of fruits and vegetables.  Education: All About Nutrition: -Group instruction provided by verbal, written material, interactive activities, discussions, models, and posters to present general guidelines for heart healthy nutrition including fat, fiber, MyPlate, the role of sodium in heart healthy nutrition, utilization of the nutrition label, and utilization of this knowledge for meal planning. Follow up email sent as well. Written material given at graduation. Flowsheet Row Cardiac Rehab from 09/02/2018 in [facility] Cardiac and Pulmonary Rehab  Date 08/26/18  Educator LB  Instruction Review Code 1- Verbalizes Understanding      Biometrics:    Nutrition Therapy Plan and Nutrition Goals:   Nutrition Assessments:  MEDIFICTS Score Key:  ?70 Need to make  dietary changes   40-70 Heart Healthy Diet  ? 40 Therapeutic Level Cholesterol Diet   Picture Your Plate Scores:  <74 Unhealthy dietary pattern with much room for improvement.  41-50 Dietary pattern unlikely to meet recommendations for good health and room for improvement.  51-60 More healthful dietary pattern, with some room for improvement.   >60 Healthy dietary pattern, although there may be some specific behaviors that could be improved.   Nutrition Goals Re-Evaluation:  Nutrition Goals Re-Evaluation    Row Name 11/30/20 1117 12/28/20 1144           Goals   Nutrition Goal Eat healthier Pt would not like to make goals at this time      Comment Lee has gotten better about his diet and is really trying to be good through holidays.  He is feeling better about it.Rayna Sexton continues to have no questions regarding nutritoin and would not like to make any changes. He reports eating a mostly healthy diet.      Expected Outcome Short: Continue to focus on heart healthy Long; Continue to work on weight loss Pt would not like to make goals at this time             Nutrition Goals Discharge (Final Nutrition Goals Re-Evaluation):  Nutrition Goals Re-Evaluation - 12/28/20 1144      Goals   Nutrition Goal Pt would not like to make goals at this time    Comment Deveron continues to have no questions regarding nutritoin and would not like to make any changes. He reports eating a mostly healthy diet.    Expected Outcome Pt would not like to make goals at this time           Psychosocial:  Target Goals: Acknowledge presence or absence of significant depression and/or stress, maximize coping skills, provide positive support system. Participant is able to verbalize types and ability to use techniques and skills needed for reducing stress and depression.   Education: Stress, Anxiety, and Depression - Group verbal and visual presentation to define topics covered.  Reviews how body is  impacted by stress, anxiety, and depression.  Also discusses healthy ways to reduce stress and to treat/manage anxiety and depression.  Written material given at graduation. Flowsheet Row Pulmonary Rehab from 09/21/2020 in [facility] Cardiac and Pulmonary Rehab  Date 09/21/20  Educator [facility]  Instruction Review Code 1- [employer] Understanding      Education: Sleep Hygiene -Provides group verbal and written instruction about how sleep can affect your health.  Define sleep hygiene, discuss sleep cycles and impact of sleep habits. Review good sleep hygiene tips.  Flowsheet Row Cardiac Rehab from 09/02/2018 in [facility] Cardiac and Pulmonary Rehab  Date 08/19/18  Educator [facility]  Instruction Review Code 1- Verbalizes Understanding  [Arrived late to class]      Initial Review & Psychosocial Screening:   Quality of Life Scores:  Scores of 19 and below usually indicate a poorer quality of life in these areas.  A difference of  2-3 points is a clinically meaningful difference.  A difference of 2-3 points in the total score of the Quality of Life Index has been associated with significant improvement in overall quality of life, self-image, physical symptoms, and general health in studies assessing change in quality of life.  PHQ-9: Recent Review Flowsheet Data    Depression screen [facility] 2/9 09/13/2020 08/26/2018 05/01/2018   Decreased Interest 0 0 0   Down, Depressed, Hopeless 0 0 0   PHQ - 2 Score 0 0 0   Altered sleeping 0 0 0   Tired, decreased energy 1 1 0   Change in appetite 0 - 0   Feeling bad or failure about yourself  0 0 0   Trouble concentrating 0 0 0   Moving slowly or fidgety/restless 0 0 0   Suicidal thoughts 0 0 0   PHQ-9 Score 1 1 0   Difficult doing work/chores Not difficult at all Not difficult at all Not difficult at all     Interpretation of Total Score  Total Score Depression Severity:  1-4 = Minimal depression, 5-9 = Mild depression, 10-14 = Moderate depression, 15-19 = Moderately  severe depression, 20-27 = Severe depression   Psychosocial Evaluation and Intervention:   Psychosocial Re-Evaluation:  Psychosocial Re-Evaluation    Row Name 11/30/20 1114 12/26/20 1134           Psychosocial Re-Evaluation   Current issues with Current Anxiety/Panic;Current Stress Concerns Current Anxiety/Panic;Current Stress Concerns      Comments Chrisotpher is doing well with exercise.  He is frustrated about not seeing his kids and grandkids for Christmas, but they do have plans to go up in January.  He is then just worried about the cost of gas and travel.  He is planning to ride his motorcycle this weekend.  He is sleeping well and breathing better with the cooler air.  His anxiety has been up a little with his frustrations, but overall doing well on his xanax. Mccormick reported no major changes in his anxiety. He does continue to take his anxiety medication and works with his breathing when he does become anxious. He does have a partner that he talks to for support and tries not to think  to far ahead about things that he knows will make his anxious. He stated he wants to call Dr. Jayme Cloud this week to talk about his breathing difficulties when he does have anxiety attacks.      Expected Outcomes Short: Look forward to traveling after Christmas.  Long: Continue to manage activities. Short: Call doctor to discuss anxiety concerns. Long: continue to work on managing anxiety and maintaining good mental health.      Interventions Encouraged to attend Pulmonary Rehabilitation for the exercise Encouraged to attend Pulmonary Rehabilitation for the exercise      Continue Psychosocial Services  Follow up required by staff Follow up required by staff             Psychosocial Discharge (Final Psychosocial Re-Evaluation):  Psychosocial Re-Evaluation - 12/26/20 1134      Psychosocial Re-Evaluation   Current issues with Current Anxiety/Panic;Current Stress Concerns    Comments Mackson reported no major  changes in his anxiety. He does continue to take his anxiety medication and works with his breathing when he does become anxious. He does have a partner that he talks to for support and tries not to think to far ahead about things that he knows will make his anxious. He stated he wants to call Dr. Jayme Cloud this week to talk about his breathing difficulties when he does have anxiety attacks.    Expected Outcomes Short: Call doctor to discuss anxiety concerns. Long: continue to work on managing anxiety and maintaining good mental health.    Interventions Encouraged to attend Pulmonary Rehabilitation for the exercise    Continue Psychosocial Services  Follow up required by staff           Education: Education Goals: Education classes will be provided on a weekly basis, covering required topics. Participant will state understanding/return demonstration of topics presented.  Learning Barriers/Preferences:   General Pulmonary Education Topics:  Infection Prevention: - Provides verbal and written material to individual with discussion of infection control including proper hand washing and proper equipment cleaning during exercise session. Flowsheet Row Pulmonary Rehab from 09/21/2020 in [facility] Cardiac and Pulmonary Rehab  Date 09/13/20  Educator AS  Instruction Review Code 1- Verbalizes Understanding      Falls Prevention: - Provides verbal and written material to individual with discussion of falls prevention and safety. Flowsheet Row Pulmonary Rehab from 09/21/2020 in [facility] Cardiac and Pulmonary Rehab  Date 09/13/20  Educator AS  Instruction Review Code 1- Verbalizes Understanding      Chronic Lung Disease Review: - Group verbal instruction with posters, models, PowerPoint presentations and videos,  to review new updates, new respiratory medications, new advancements in procedures and treatments. Providing information on websites and "800" numbers for continued self-education. Includes  information about supplement oxygen, available portable oxygen systems, continuous and intermittent flow rates, oxygen safety, concentrators, and Medicare reimbursement for oxygen. Explanation of Pulmonary Drugs, including class, frequency, complications, importance of spacers, rinsing mouth after steroid MDI's, and proper cleaning methods for nebulizers. Review of basic lung anatomy and physiology related to function, structure, and complications of lung disease. Review of risk factors. Discussion about methods for diagnosing sleep apnea and types of masks and machines for OSA. Includes a review of the use of types of environmental controls: home humidity, furnaces, filters, dust mite/pet prevention, HEPA vacuums. Discussion about weather changes, air quality and the benefits of nasal washing. Instruction on Warning signs, infection symptoms, calling MD promptly, preventive modes, and value of vaccinations. Review of effective airway clearance, coughing  and/or vibration techniques. Emphasizing that all should Create an Action Plan. Written material given at graduation.   AED/CPR: - Group verbal and written instruction with the use of models to demonstrate the basic use of the AED with the basic ABC's of resuscitation. Flowsheet Row Cardiac Rehab from 09/02/2018 in [facility] Cardiac and Pulmonary Rehab  Date 09/02/18  Educator [place]  Instruction Review Code 1- Verbalizes Understanding       Anatomy and Cardiac Procedures: - Group verbal and visual presentation and models provide information about basic cardiac anatomy and function. Reviews the testing methods done to diagnose heart disease and the outcomes of the test results. Describes the treatment choices: Medical Management, Angioplasty, or Coronary Bypass Surgery for treating various heart conditions including Myocardial Infarction, Angina, Valve Disease, and Cardiac Arrhythmias.  Written material given at graduation. Flowsheet Row Cardiac Rehab from  09/02/2018 in [facility] Cardiac and Pulmonary Rehab  Date 08/07/18  Educator CE  Instruction Review Code 1- Verbalizes Understanding      Medication Safety: - Group verbal and visual instruction to review commonly prescribed medications for heart and lung disease. Reviews the medication, class of the drug, and side effects. Includes the steps to properly store meds and maintain the prescription regimen.  Written material given at graduation. Flowsheet Row Cardiac Rehab from 09/02/2018 in [facility] Cardiac and Pulmonary Rehab  Date 08/12/18  Educator SB  Instruction Review Code 1- Verbalizes Understanding      Other: -Provides group and verbal instruction on various topics (see comments)   Knowledge Questionnaire Score:    Core Components/Risk Factors/Patient Goals at Admission:   Education:Diabetes - Individual verbal and written instruction to review signs/symptoms of diabetes, desired ranges of glucose level fasting, after meals and with exercise. Acknowledge that pre and post exercise glucose checks will be done for 3 sessions at entry of program. Flowsheet Row Cardiac Rehab from 09/02/2018 in [facility] Cardiac and Pulmonary Rehab  Date 05/01/18  Educator [facility]  Instruction Review Code 1- Verbalizes Understanding      Know Your Numbers and Heart Failure: - Group verbal and visual instruction to discuss disease risk factors for cardiac and pulmonary disease and treatment options.  Reviews associated critical values for Overweight/Obesity, Hypertension, Cholesterol, and Diabetes.  Discusses basics of heart failure: signs/symptoms and treatments.  Introduces Heart Failure Zone chart for action plan for heart failure.  Written material given at graduation. Flowsheet Row Cardiac Rehab from 09/02/2018 in [facility] Cardiac and Pulmonary Rehab  Date 08/07/18  Educator CE  Instruction Review Code 1- Verbalizes Understanding      Core Components/Risk Factors/Patient Goals Review:   Goals and Risk Factor  Review    Row Name 11/30/20 1121 12/26/20 1123           Core Components/Risk Factors/Patient Goals Review   Personal Goals Review Improve shortness of breath with ADL's;Diabetes;Weight Management/Obesity;Hypertension Improve shortness of breath with ADL's;Diabetes;Weight Management/Obesity;Hypertension      Review Kylin is officially two years out from qutting smoking.  His breathing is getting better.  His sugars have been doing well overall.  His pressures continue to do well for him. Kamerin reports taking all medications and monitoring blood sugars at home. His weight has been steady and he manages his SOB with O2 as needed depending on temperature and air pressure.      Expected Outcomes Short: Continue to work on breathing  Long: Continue to manage risk factors. Short: continue to work on purse lip breathing to manage SOB and continue to take  all medications as prescribed. Long: Continue heart healthy lifestyle to manage cardiac risk factors.             Core Components/Risk Factors/Patient Goals at Discharge (Final Review):   Goals and Risk Factor Review - 12/26/20 1123      Core Components/Risk Factors/Patient Goals Review   Personal Goals Review Improve shortness of breath with ADL's;Diabetes;Weight Management/Obesity;Hypertension    Review Rayna SextonRalph reports taking all medications and monitoring blood sugars at home. His weight has been steady and he manages his SOB with O2 as needed depending on temperature and air pressure.    Expected Outcomes Short: continue to work on purse lip breathing to manage SOB and continue to take all medications as prescribed. Long: Continue heart healthy lifestyle to manage cardiac risk factors.           ITP Comments:  ITP Comments    Row Name 11/16/20 1043 12/14/20 0632 01/11/21 0945 01/11/21 1202 01/23/21 1002   ITP Comments 30 Day review completed. Medical Director ITP review done, changes made as directed, and signed approval by Medical Director.  30 Day review completed. Medical Director ITP review done, changes made as directed, and signed approval by Medical Director. 30 Day review completed. Medical Director ITP review done, changes made as directed, and signed approval by Medical Director. Rayna SextonRalph has been out with sick - diagnosed with COVID 01/04/21. Rayna SextonRalph has been admitted to the hospital with COVID pneumonia and respitory failure due to COVID. Still admitted with potential d/c today.   Row Name 02/08/21 0704 02/08/21 1356 02/15/21 1514 02/22/21 1025 03/08/21 1013   ITP Comments 30 Day review completed. Medical Director ITP review done, changes made as directed, and signed approval by Medical Director. Rayna SextonRalph has not attended since last review.  He is currently back in the hospital for continued respiratory issues. Called to check on pt.  He has been in hospital in and out with COVID and respiratory failure.  He is still feeling pretty out of it and very fatigue.  He is staying out of the public for right now. He is not quite ready to come back yet and wants to hold out for two weeks. He did sign a DNR yesterday. Rayna SextonRalph is supposed to return next week. 30 Day review completed. Medical Director ITP review done, changes made as directed, and signed approval by Medical Director.  Has not returned yet   Row Name 03/21/21 [id number] 04/05/21 0625 04/05/21 1128 04/24/21 1539 05/02/21 1412   ITP Comments Rayna Sextonalph saw his pulmonologist yesterday and they want him back in rehab as soon as he finishes up with home PT.  We will touch base with him again in two weeks if we have not heard from him before then. 30 Day review completed. Medical Director ITP review done, changes made as directed, and signed approval by Medical Director. remains out for medical reasons Rayna SextonRalph plans to return when he finishes home health PT. Called to check on Rayna SextonRalph. He is still recovering and getting PT.  He plans to return on 05/08/21.  I did tell him that if we go beyond this month, we will  need to discharge and get a new referral.  He stated understanding. Rayna SextonRalph has not attended due to illness.  He is scheduled to return next week.   Row Name 05/03/21 0751           ITP Comments 30 Day review completed. Medical Director ITP review done, changes made as directed, and signed approval  by Medical Director.              Comments:

## 2021-05-08 ENCOUNTER — Encounter: Payer: Self-pay | Admitting: *Deleted

## 2021-05-08 ENCOUNTER — Ambulatory Visit: Payer: Medicare Other

## 2021-05-08 DIAGNOSIS — J449 Chronic obstructive pulmonary disease, unspecified: Secondary | ICD-10-CM

## 2021-05-08 NOTE — Progress Notes (Signed)
Pulmonary Individual Treatment Plan  Patient Details  Name: Harold Parker MRN: [id number] Date of Birth: 05/14/1946 Referring Provider:   Flowsheet Row Pulmonary Rehab from 09/13/2020 in [facility] Cardiac and Pulmonary Rehab  Referring Provider Harold Parker      Initial Encounter Date:  Flowsheet Row Pulmonary Rehab from 09/13/2020 in [facility] Parker Cardiac and Pulmonary Rehab  Date 09/13/20      Visit Diagnosis: Chronic obstructive pulmonary disease, unspecified COPD type (HCC)  Patient's Home Medications on Admission:  Current Outpatient Medications:  .  acetaminophen (TYLENOL) 500 MG tablet, Take 1,000 mg by mouth daily as needed for moderate pain or headache., Disp: , Rfl:  .  albuterol (PROVENTIL) (2.5 MG/3ML) 0.083% nebulizer solution, Take 3 mLs (2.5 mg total) by nebulization every 6 (six) hours as needed for wheezing or shortness of breath., Disp: 75 mL, Rfl: 12 .  ALPRAZolam (XANAX) 0.5 MG tablet, Take 0.5 mg by mouth 2 (two) times daily., Disp: , Rfl:  .  aspirin EC 81 MG tablet, Take 81 mg by mouth daily., Disp: , Rfl:  .  budesonide (PULMICORT) 0.5 MG/2ML nebulizer solution, Take 2 mLs (0.5 mg total) by nebulization 2 (two) times daily. Use AFTER Perforomist, Disp: 120 mL, Rfl: 11 .  busPIRone (BUSPAR) 10 MG tablet, Take 10 mg by mouth 2 (two) times daily., Disp: , Rfl:  .  busPIRone (BUSPAR) 5 MG tablet, Take 2 tablets (10 mg total) by mouth 2 (two) times daily. (Patient not taking: Reported on 05/02/2021), Disp: , Rfl:  .  dextromethorphan-guaiFENesin (ROBITUSSIN-DM) 10-100 MG/5ML liquid, Take 30 mLs by mouth every 4 (four) hours as needed for cough., Disp: , Rfl:  .  doxycycline (VIBRA-TABS) 100 MG tablet, Take 1 tablet (100 mg total) by mouth 2 (two) times daily. (Patient not taking: Reported on 05/02/2021), Disp: 20 tablet, Rfl: 0 .  esomeprazole (NEXIUM) 40 MG capsule, Take 40 mg by mouth at bedtime. , Disp: , Rfl:  .  ezetimibe (ZETIA) 10 MG tablet, Take 10 mg by mouth at bedtime. , Disp:  , Rfl:  .  folic acid (FOLVITE) 400 MCG tablet, Take 400 mcg by mouth every evening., Disp: , Rfl:  .  formoterol (PERFOROMIST) 20 MCG/2ML nebulizer solution, Take 2 mLs (20 mcg total) by nebulization 2 (two) times daily., Disp: 120 mL, Rfl: 11 .  furosemide (LASIX) 40 MG tablet, TAKE 1 TABLET BY MOUTH EVERY DAY, Disp: 90 tablet, Rfl: 3 .  glipiZIDE (GLUCOTROL) 5 MG tablet, Take 0.5 tablets (2.5 mg total) by mouth daily before breakfast. (Patient taking differently: Take 5 mg by mouth See admin instructions. Take 5 mg in the morning and take a second 5 mg dose at night on Mon, Wed, and Fri), Disp: 30 tablet, Rfl: 1 .  guaiFENesin (MUCINEX) 600 MG 12 hr tablet, Take 1,200 mg by mouth every 12 (twelve) hours as needed., Disp: , Rfl:  .  losartan (COZAAR) 100 MG tablet, Take 100 mg by mouth daily., Disp: , Rfl:  .  Melatonin 10 MG CAPS, Take 10 mg by mouth at bedtime., Disp: , Rfl:  .  metFORMIN (GLUCOPHAGE) 1000 MG tablet, TAKE 1 TABLET BY MOUTH TWICE DAILY WITH A MEAL (Patient taking differently: Take 1,000 mg by mouth 2 (two) times daily.), Disp: 60 tablet, Rfl: 0 .  metoprolol succinate (TOPROL-XL) 100 MG 24 hr tablet, TAKE 1 TABLET BY MOUTH EVERY DAY WITH OR IMMEDIATELY FOLLOWING A MEAL, Disp: 90 tablet, Rfl: 1 .  predniSONE (DELTASONE) 20 MG tablet, Take 1 tablet (20  mg total) by mouth daily with breakfast. (Patient not taking: Reported on 05/02/2021), Disp: 7 tablet, Rfl: 0 .  predniSONE (STERAPRED UNI-PAK 21 TAB) 10 MG (21) TBPK tablet, Use as directed., Disp: 21 tablet, Rfl: 0 .  PROAIR HFA 108 (90 Base) MCG/ACT inhaler, Inhale 1 puff into the lungs every 4 (four) hours as needed for wheezing or shortness of breath., Disp: , Rfl:  .  revefenacin (YUPELRI) 175 MCG/3ML nebulizer solution, Take 3 mLs (175 mcg total) by nebulization daily. Can be mixed with Perforomist, Disp: 90 mL, Rfl: 11 .  sucralfate (CARAFATE) 1 GM/10ML suspension, Take 10 mLs (1 g total) by mouth 4 (four) times daily -  with  meals and at bedtime., Disp: 420 mL, Rfl: 0 .  traZODone (DESYREL) 50 MG tablet, Take 50 mg by mouth at bedtime as needed., Disp: , Rfl:  .  Vitamin D, Cholecalciferol, 1000 units TABS, Take 1,000 Units by mouth every morning. , Disp: , Rfl:   Past Medical History: Past Medical History:  Diagnosis Date  . Bilateral carotid bruits    a. 01/2018 U/S: < 50% bilat ICA stenosis.  Marland Kitchen. CAD (coronary artery disease)    a. 1998 s/p MI and BMS [facility]([place], [place]); b. 1999 redo PCI/rotablator in setting of what sounds like ISR;  c. Multiple stress tests over the years - last ~ 2017, reportedly nl; d. 01/2018 NSTEMI/Cath: LM 1511m/d, LAD 50p, 40p/m, D1 60ost, OM1 95, RCA 100ost/p w/ L->R collats, EF 45%; e. s/p 3V CABG 02/19/18 (LIMA-LAD, VG-D1, VG-OM)  . Chronic lower back pain   . COPD (chronic obstructive pulmonary disease) (HCC)   . GIB (gastrointestinal bleeding)    a. 02/2018 GIB and anemia w/ Hgb of 4.7 on presentation; b. 03/2018 EGD: 2 nonbleeding duodenal ulcers.  Marland Kitchen. HTN (hypertension)   . Hypercholesteremia   . Ischemic cardiomyopathy    a. 01/2018 Echo: EF 40-45%, mid-apicalanteroseptal, ant, apical sev HK, mod apicalinferior HK. Gr2 DD. Mod AS, mild MR, mod dil LA, PASP 50mmHg.  . Moderate aortic stenosis    a. 01/2018 Echo: Mod AS, mean grad (S) 20mmHg, Valve area (VTI) 1.06 cm^2, (Vmax) 1.27 cm^2; b. 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  . Myocardial infarction (HCC) ~ 1998/1999  . 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 02/19/2018 AVR: 25 mm Edwards Inspiris Resilia stented bovine pericardial tissue valve  . 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 Tobacco abuse   . Type II diabetes mellitus (HCC)     Tobacco Use: Social History   Tobacco Use  Smoking Status Former Smoker  . Packs/day: 1.00  . Years: 40.00  . Pack years: 40.00  . Types: Cigarettes  . Quit date: 02/02/2018  . Years since quitting: 3.2  Smokeless  Tobacco Never Used  Tobacco Comment   Quit 01/2018 - on 05/01/2018 talked to Rayna SextonRalph about Relaspe concerns. He ststaes that he has no taste for tobacco since he quit in Feb.    Labs: Recent Review Flowsheet Data    Labs for ITP Cardiac and Pulmonary Rehab Latest Ref Rng & Units 02/20/2018 03/27/2018 01/11/2021 01/12/2021 02/06/2021   Cholestrol 0 - 200 mg/dL - 161102 - 90 -   LDLCALC 0 - 99 mg/dL - 50 - 45 -   HDL >09>40 mg/dL - 60(A35(L) - 54(U28(L) -   Trlycerides <150 mg/dL - 84 55 87 -   Hemoglobin A1c  4.8 - 5.6 % - - - 7.5(H) -   PHART 7.350 - 7.450 7.331(L) - - - 7.47(H)   PCO2ART 32.0 - 48.0 mmHg 41.0 - - - 36   HCO3 20.0 - 28.0 mmol/L 21.5 - - - 26.2   TCO2 22 - 32 mmol/L 23 - - - -   ACIDBASEDEF 0.0 - 2.0 mmol/L 4.0(H) - - - -   O2SAT % 95.0 - - - 92.6       Pulmonary Assessment Scores:   UCSD: Self-administered rating of dyspnea associated with activities of daily living (ADLs) 6-point scale (0 = "not at all" to 5 = "maximal or unable to do because of breathlessness")  Scoring Scores range from 0 to 120.  Minimally important difference is 5 units  CAT: CAT can identify the health impairment of COPD patients and is better correlated with disease progression.  CAT has a scoring range of zero to 40. The CAT score is classified into four groups of low (less than 10), medium (10 - 20), high (21-30) and very high (31-40) based on the impact level of disease on health status. A CAT score over 10 suggests significant symptoms.  A worsening CAT score could be explained by an exacerbation, poor medication adherence, poor inhaler technique, or progression of COPD or comorbid conditions.  CAT MCID is 2 points  mMRC: mMRC (Modified Medical Research Council) Dyspnea Scale is used to assess the degree of baseline functional disability in patients of respiratory disease due to dyspnea. No minimal important difference is established. A decrease in score of 1 point or greater is considered a positive change.    Pulmonary Function Assessment:   Exercise Target Goals: Exercise Program Goal: Individual exercise prescription set using results from initial 6 min walk test and THRR while considering  patient's activity barriers and safety.   Exercise Prescription Goal: Initial exercise prescription builds to 30-45 minutes a day of aerobic activity, 2-3 days per week.  Home exercise guidelines will be given to patient during program as part of exercise prescription that the participant will acknowledge.  Education: Aerobic Exercise: - Group verbal and visual presentation on the components of exercise prescription. Introduces F.I.T.T principle from ACSM for exercise prescriptions.  Reviews F.I.T.T. principles of aerobic exercise including progression. Written material given at graduation. Flowsheet Row Cardiac Rehab from 09/02/2018 in [facility] Cardiac and Pulmonary Rehab  Date 07/24/18  Educator AS  Instruction Review Code 1- Verbalizes Understanding      Education: Resistance Exercise: - Group verbal and visual presentation on the components of exercise prescription. Introduces F.I.T.T principle from ACSM for exercise prescriptions  Reviews F.I.T.T. principles of resistance exercise including progression. Written material given at graduation.    Education: Exercise & Equipment Safety: - Individual verbal instruction and demonstration of equipment use and safety with use of the equipment. Flowsheet Row Pulmonary Rehab from 09/21/2020 in [facility] Cardiac and Pulmonary Rehab  Date 09/13/20  Educator AS  Instruction Review Code 1- Verbalizes Understanding      Education: Exercise Physiology & General Exercise Guidelines: - Group verbal and written instruction with models to review the exercise physiology of the cardiovascular system and associated critical values. Provides general exercise guidelines with specific guidelines to those with heart or lung disease.  Flowsheet Row Cardiac Rehab from 09/02/2018  in [facility] Cardiac and Pulmonary Rehab  Date 05/20/18  Educator AS  Instruction Review Code 4- No Evidence of Learning  [charting in error]      Education: Flexibility, Balance, Mind/Body  Relaxation: - Group verbal and visual presentation with interactive activity on the components of exercise prescription. Introduces F.I.T.T principle from ACSM for exercise prescriptions. Reviews F.I.T.T. principles of flexibility and balance exercise training including progression. Also discusses the mind body connection.  Reviews various relaxation techniques to help reduce and manage stress (i.e. Deep breathing, progressive muscle relaxation, and visualization). Balance handout provided to take home. Written material given at graduation. Flowsheet Row Cardiac Rehab from 09/02/2018 in [facility] Cardiac and Pulmonary Rehab  Date 07/29/18  Educator AS  Instruction Review Code 1- Verbalizes Understanding      Activity Barriers & Risk Stratification:   6 Minute Walk:  Oxygen Initial Assessment:   Oxygen Re-Evaluation:  Oxygen Re-Evaluation    Row Name 11/30/20 1118 12/21/20 1127           Program Oxygen Prescription   Program Oxygen Prescription Continuous;E-Tanks Continuous      Liters per minute 3 3             Home Oxygen   Home Oxygen Device Home Concentrator;Portable Concentrator Home Concentrator;Portable Concentrator;E-Tanks      Sleep Oxygen Prescription Continuous Continuous      Liters per minute 2 2      Home Exercise Oxygen Prescription Continuous Continuous      Liters per minute 2 2      Home Resting Oxygen Prescription Continuous Continuous      Liters per minute 2 2  as Needed      Compliance with Home Oxygen Use Yes Yes             Goals/Expected Outcomes   Short Term Goals To learn and understand importance of maintaining oxygen saturations>88%;To learn and understand importance of monitoring SPO2 with pulse oximeter and demonstrate accurate use of the pulse oximeter.;To learn  and exhibit compliance with exercise, home and travel O2 prescription;To learn and demonstrate proper pursed lip breathing techniques or other breathing techniques.;To learn and demonstrate proper use of respiratory medications To learn and understand importance of monitoring SPO2 with pulse oximeter and demonstrate accurate use of the pulse oximeter.;To learn and understand importance of maintaining oxygen saturations>88%      Long  Term Goals Exhibits compliance with exercise, home and travel O2 prescription;Verbalizes importance of monitoring SPO2 with pulse oximeter and return demonstration;Maintenance of O2 saturations>88%;Exhibits proper breathing techniques, such as pursed lip breathing or other method taught during program session;Compliance with respiratory medication;Demonstrates proper use of MDI's Maintenance of O2 saturations>88%;Verbalizes importance of monitoring SPO2 with pulse oximeter and return demonstration      Comments Harold Parker is staying compliant with his oxygen and his breathing is getting better.  He continues to monitor his saturations and doing well with his breathing and using his PLB. Harold Parker states he is going to use his oxygen when walking in to rehab. Informed him that his oxygen could be getting to low without it. He has a wrist band that monitors his oxygen and knows he needs to be 88 percent and above. He has no questions about his medications.      Goals/Expected Outcomes Short: Continue to breathing better  Long; Continue to improve compliance Short: wear oxygen into rehab. Long: maintain oxygen saturations of 88 percent and above independently.             Oxygen Discharge (Final Oxygen Re-Evaluation):  Oxygen Re-Evaluation - 12/21/20 1127      Program Oxygen Prescription   Program Oxygen Prescription Continuous    Liters per minute 3  Home Oxygen   Home Oxygen Device Home Concentrator;Portable Concentrator;E-Tanks    Sleep Oxygen Prescription Continuous     Liters per minute 2    Home Exercise Oxygen Prescription Continuous    Liters per minute 2    Home Resting Oxygen Prescription Continuous    Liters per minute 2   as Needed   Compliance with Home Oxygen Use Yes      Goals/Expected Outcomes   Short Term Goals To learn and understand importance of monitoring SPO2 with pulse oximeter and demonstrate accurate use of the pulse oximeter.;To learn and understand importance of maintaining oxygen saturations>88%    Long  Term Goals Maintenance of O2 saturations>88%;Verbalizes importance of monitoring SPO2 with pulse oximeter and return demonstration    Comments Harold Parker states he is going to use his oxygen when walking in to rehab. Informed him that his oxygen could be getting to low without it. He has a wrist band that monitors his oxygen and knows he needs to be 88 percent and above. He has no questions about his medications.    Goals/Expected Outcomes Short: wear oxygen into rehab. Long: maintain oxygen saturations of 88 percent and above independently.           Initial Exercise Prescription:   Perform Capillary Blood Glucose checks as needed.  Exercise Prescription Changes:  Exercise Prescription Changes    Row Name 11/14/20 1600 11/30/20 1400 12/14/20 1000 12/26/20 1100       Response to Exercise   Blood Pressure (Admit) 138/70 118/60 140/64 142/80    Blood Pressure (Exercise) 142/76 130/70 138/58 138/64    Blood Pressure (Exit) 128/58 118/58 122/58 126/70    Heart Rate (Admit) 83 bpm 100 bpm 74 bpm 78 bpm    Heart Rate (Exercise) 89 bpm 888 bpm 87 bpm 78 bpm    Heart Rate (Exit) 87 bpm 81 bpm 78 bpm 64 bpm    Oxygen Saturation (Admit) 94 % 90 % 89 % 88 %    Oxygen Saturation (Exercise) 93 % 92 % 87 % 88 %    Oxygen Saturation (Exit) 92 % 95 % 92 % 98 %    Rating of Perceived Exertion (Exercise) 11 13 11 11     Perceived Dyspnea (Exercise) -- 2 0 1    Symptoms none SOB -- SOB    Duration Continue with 30 min of aerobic exercise  without signs/symptoms of physical distress. Continue with 30 min of aerobic exercise without signs/symptoms of physical distress. Continue with 30 min of aerobic exercise without signs/symptoms of physical distress. Continue with 30 min of aerobic exercise without signs/symptoms of physical distress.    Intensity THRR unchanged THRR unchanged THRR unchanged THRR unchanged         Progression   Progression Continue to progress workloads to maintain intensity without signs/symptoms of physical distress. Continue to progress workloads to maintain intensity without signs/symptoms of physical distress. Continue to progress workloads to maintain intensity without signs/symptoms of physical distress. Continue to progress workloads to maintain intensity without signs/symptoms of physical distress.    Average METs 2.15 2.4 1.9 2.2         Resistance Training   Training Prescription Yes Yes Yes Yes    Weight 5 lb 5 lb 5 lb 5 lb    Reps 10-15 10-15 10-15 10-15         Interval Training   Interval Training No No No No         Oxygen   Oxygen  Continuous Continuous Continuous Continuous    Liters 3 3 3 6   bad breathing day         NuStep   Level 4 6 6 4     SPM 80 -- -- --    Minutes 15 15 15 30     METs 2.15 2.4 1.9 2.2         Home Exercise Plan   Plans to continue exercise at -- Home (comment)  bike, walking Home (comment)  bike, walking Home (comment)  bike, walking    Frequency -- Add 2 additional days to program exercise sessions. Add 2 additional days to program exercise sessions. Add 2 additional days to program exercise sessions.    Initial Home Exercises Provided -- 11/02/20 11/02/20 11/02/20           Exercise Comments:   Exercise Goals and Review:   Exercise Goals Re-Evaluation :  Exercise Goals Re-Evaluation    Row Name 11/14/20 1642 11/30/20 1112 12/14/20 1006 12/26/20 1116 01/23/21 1003     Exercise Goal Re-Evaluation   Exercise Goals Review Increase Physical  Activity;Increase Strength and Stamina;Understanding of Exercise Prescription Increase Physical Activity;Increase Strength and Stamina;Understanding of Exercise Prescription Increase Physical Activity;Increase Strength and Stamina Increase Physical Activity;Increase Strength and Stamina --   Comments Harold Parker lowered level on T4 today.  Staff will continue to monitor progress. Harold Parker is doing well in rehab.  He is walking on his off days.  He is feeling stronger overall and better. Harold Parker has moved up to level 6 on NS.  More consistent attendance would help him progress more. Harold Parker stated that he feels like he is making progress with his exercise and is getting stronger. He said he works at his own pace and feels like he is doing well. HR and SaO2 are maintained in acceptable ranges duing exercise. Currently Parker and Parker since last review   Expected Outcomes Short: get back up to previous levels Long: maintain consistent exercise Short: Continue to stay active  Long: Continue to improve stamina. Short:  attend at least twice per week Long: improve overall stamina Short: continue to attend pulmonary rehab at least 2 days a week. Long: Plans to exercise outside and be more active once the temperature increases which is easier on his breathing. --   Row Name 02/15/21 1514 02/22/21 1026 03/21/21 0824 04/24/21 1539       Exercise Goal Re-Evaluation   Comments Parker since last review.  Currently home on medical hold for 2 weeks Parker since last review, supposed to return next week. Parker since last review, hold to finish home PT Parker since last review, hold to finish home PT           Discharge Exercise Prescription (Final Exercise Prescription Changes):  Exercise Prescription Changes - 12/26/20 1100      Response to Exercise   Blood Pressure (Admit) 142/80    Blood Pressure (Exercise) 138/64    Blood Pressure (Exit) 126/70    Heart Rate (Admit) 78 bpm    Heart Rate (Exercise) 78 bpm    Heart Rate (Exit) 64  bpm    Oxygen Saturation (Admit) 88 %    Oxygen Saturation (Exercise) 88 %    Oxygen Saturation (Exit) 98 %    Rating of Perceived Exertion (Exercise) 11    Perceived Dyspnea (Exercise) 1    Symptoms SOB    Duration Continue with 30 min of aerobic exercise without signs/symptoms of physical distress.    Intensity THRR unchanged  Progression   Progression Continue to progress workloads to maintain intensity without signs/symptoms of physical distress.    Average METs 2.2      Resistance Training   Training Prescription Yes    Weight 5 lb    Reps 10-15      Interval Training   Interval Training No      Oxygen   Oxygen Continuous    Liters 6   bad breathing day     NuStep   Level 4    Minutes 30    METs 2.2      Home Exercise Plan   Plans to continue exercise at Home (comment)   bike, walking   Frequency Add 2 additional days to program exercise sessions.    Initial Home Exercises Provided 11/02/20           Nutrition:  Target Goals: Understanding of nutrition guidelines, daily intake of sodium 1500mg , cholesterol 200mg , calories 30% from fat and 7% or less from saturated fats, daily to have 5 or more servings of fruits and vegetables.  Education: All About Nutrition: -Group instruction provided by verbal, written material, interactive activities, discussions, models, and posters to present general guidelines for heart healthy nutrition including fat, fiber, MyPlate, the role of sodium in heart healthy nutrition, utilization of the nutrition label, and utilization of this knowledge for meal planning. Follow up email sent as well. Written material given at graduation. Flowsheet Row Cardiac Rehab from 09/02/2018 in [facility] Cardiac and Pulmonary Rehab  Date 08/26/18  Educator LB  Instruction Review Code 1- Verbalizes Understanding      Biometrics:    Nutrition Therapy Plan and Nutrition Goals:   Nutrition Assessments:  MEDIFICTS Score Key:  ?70 Need to make  dietary changes   40-70 Heart Healthy Diet  ? 40 Therapeutic Level Cholesterol Diet   Picture Your Plate Scores:  <16 Unhealthy dietary pattern with much room for improvement.  41-50 Dietary pattern unlikely to meet recommendations for good health and room for improvement.  51-60 More healthful dietary pattern, with some room for improvement.   >60 Healthy dietary pattern, although there may be some specific behaviors that could be improved.   Nutrition Goals Re-Evaluation:  Nutrition Goals Re-Evaluation    Row Name 11/30/20 1117 12/28/20 1144           Goals   Nutrition Goal Eat healthier Pt would not like to make goals at this time      Comment Ludwin has gotten better about his diet and is really trying to be good through holidays.  He is feeling better about it.Rayna Sexton continues to have no questions regarding nutritoin and would not like to make any changes. He reports eating a mostly healthy diet.      Expected Outcome Short: Continue to focus on heart healthy Long; Continue to work on weight loss Pt would not like to make goals at this time             Nutrition Goals Discharge (Final Nutrition Goals Re-Evaluation):  Nutrition Goals Re-Evaluation - 12/28/20 1144      Goals   Nutrition Goal Pt would not like to make goals at this time    Comment Linas continues to have no questions regarding nutritoin and would not like to make any changes. He reports eating a mostly healthy diet.    Expected Outcome Pt would not like to make goals at this time           Psychosocial:  Target Goals: Acknowledge presence or absence of significant depression and/or stress, maximize coping skills, provide positive support system. Participant is able to verbalize types and ability to use techniques and skills needed for reducing stress and depression.   Education: Stress, Anxiety, and Depression - Group verbal and visual presentation to define topics covered.  Reviews how body is  impacted by stress, anxiety, and depression.  Also discusses healthy ways to reduce stress and to treat/manage anxiety and depression.  Written material given at graduation. Flowsheet Row Pulmonary Rehab from 09/21/2020 in [facility] Re[facility]  Date 09/21/20  Educator [facility]  Instruction Review Code 1- [employer] Understanding      Education: Sleep Hygiene -Provides group verbal and written instruction about how sleep can affect your health.  Define sleep hygiene, discuss sleep cycles and impact of sleep habits. Review good sleep hygiene tips.  Flowsheet Row Cardiac Rehab from 09/02/2018 in [facility] Cardiac and Pulmonary Rehab  Date 08/19/18  Educator [place]  Instruction Review Code 1- Verbalizes Understanding  [Arrived late to class]      Initial Review & Psychosocial Screening:   Quality of Life Scores:  Scores of 19 and below usually indicate a poorer quality of life in these areas.  A difference of  2-3 points is a clinically meaningful difference.  A difference of 2-3 points in the total score of the Quality of Life Index has been associated with significant improvement in overall quality of life, self-image, physical symptoms, and general health in studies assessing change in quality of life.  PHQ-9: Recent Review Flowsheet Data    Depression screen [place] [facility] 2/9 09/13/2020 08/26/2018 05/01/2018   Decreased Interest 0 0 0   Down, Depressed, Hopeless 0 0 0   PHQ - 2 Score 0 0 0   Altered sleeping 0 0 0   Tired, decreased energy 1 1 0   Change in appetite 0 - 0   Feeling bad or failure about yourself  0 0 0   Trouble concentrating 0 0 0   Moving slowly or fidgety/restless 0 0 0   Suicidal thoughts 0 0 0   PHQ-9 Score 1 1 0   Difficult doing work/chores Not difficult at all Not difficult at all Not difficult at all     Interpretation of Total Score  Total Score Depression Severity:  1-4 = Minimal depression, 5-9 = Mild depression, 10-14 = Moderate depression, 15-19 = Moderately  severe depression, 20-27 = Severe depression   Psychosocial Evaluation and Intervention:   Psychosocial Re-Evaluation:  Psychosocial Re-Evaluation    Row Name 11/30/20 1114 12/26/20 1134           Psychosocial Re-Evaluation   Current issues with Current Anxiety/Panic;Current Stress Concerns Current Anxiety/Panic;Current Stress Concerns      Comments Harold [facility] is doing well with exercise.  He is frustrated about not seeing his kids and grandkids for Christmas, but they do have plans to go up in January.  He is then just worried about the cost of gas and travel.  He is planning to ride his motorcycle this weekend.  He is sleeping well and breathing better with the cooler air.  His anxiety has been up a little with his frustrations, but overall doing well on his xanax. Harold [facility] reported no major changes in his anxiety. He does continue to take his anxiety medication and works with his breathing when he does become anxious. He does have a partner that he talks to for support and tries not to think  to far ahead about things that he knows will make his anxious. He stated he wants to call Dr. Jayme Parker this week to talk about his breathing difficulties when he does have anxiety attacks.      Expected Outcomes Short: Look forward to traveling after Christmas.  Long: Continue to manage activities. Short: Call doctor to discuss anxiety concerns. Long: continue to work on managing anxiety and maintaining good mental health.      Interventions Encouraged to attend Pulmonary Rehabilitation for the exercise Encouraged to attend Pulmonary Rehabilitation for the exercise      Continue Psychosocial Services  Follow up required by staff Follow up required by staff             Psychosocial Discharge (Final Psychosocial Re-Evaluation):  Psychosocial Re-Evaluation - 12/26/20 1134      Psychosocial Re-Evaluation   Current issues with Current Anxiety/Panic;Current Stress Concerns    Comments Harold [facility] reported no major  changes in his anxiety. He does continue to take his anxiety medication and works with his breathing when he does become anxious. He does have a partner that he talks to for support and tries not to think to far ahead about things that he knows will make his anxious. He stated he wants to call Dr. Jayme [facility] this week to talk about his breathing difficulties when he does have anxiety attacks.    Expected Outcomes Short: Call doctor to discuss anxiety concerns. Long: continue to work on managing anxiety and maintaining good mental health.    Interventions Encouraged to attend Pulmonary Rehabilitation for the exercise    Continue Psychosocial Services  Follow up required by staff           Education: Education Goals: Education classes will be provided on a weekly basis, covering required topics. Participant will state understanding/return demonstration of topics presented.  Learning Barriers/Preferences:   General Pulmonary Education Topics:  Infection Prevention: - Provides verbal and written material to individual with discussion of infection control including proper hand washing and proper equipment cleaning during exercise session. Flowsheet Row Pulmonary Rehab from 09/21/2020 in [facility] Cardiac and Pulmonary Rehab  Date 09/13/20  Educator AS  Instruction Review Code 1- Verbalizes Understanding      Falls Prevention: - Provides verbal and written material to individual with discussion of falls prevention and safety. Flowsheet Row Pulmonary Rehab from 09/21/2020 in [facility] Re[facility]  Date 09/13/20  Educator AS  Instruction Review Code 1- Verbalizes Understanding      Chronic Lung Disease Review: - Group verbal instruction with posters, models, PowerPoint presentations and videos,  to review new updates, new respiratory medications, new advancements in procedures and treatments. Providing information on websites and "800" numbers for continued self-education. Includes  information about supplement oxygen, available portable oxygen systems, continuous and intermittent flow rates, oxygen safety, concentrators, and Medicare reimbursement for oxygen. Explanation of Pulmonary Drugs, including class, frequency, complications, importance of spacers, rinsing mouth after steroid MDI's, and proper cleaning methods for nebulizers. Review of basic lung anatomy and physiology related to function, structure, and complications of lung disease. Review of risk factors. Discussion about methods for diagnosing sleep apnea and types of masks and machines for OSA. Includes a review of the use of types of environmental controls: home humidity, furnaces, filters, dust mite/pet prevention, HEPA vacuums. Discussion about weather changes, air quality and the benefits of nasal washing. Instruction on Warning signs, infection symptoms, calling MD promptly, preventive modes, and value of vaccinations. Review of effective airway clearance, coughing  and/or vibration techniques. Emphasizing that all should Create an Action Plan. Written material given at graduation.   AED/CPR: - Group verbal and written instruction with the use of models to demonstrate the basic use of the AED with the basic ABC's of resuscitation. Flowsheet Row Cardiac Rehab from 09/02/2018 in [facility] Cardiac and Pulmonary Rehab  Date 09/02/18  Educator [place]  Instruction Review Code 1- Verbalizes Understanding       Anatomy and Cardiac Procedures: - Group verbal and visual presentation and models provide information about basic cardiac anatomy and function. Reviews the testing methods done to diagnose heart disease and the outcomes of the test results. Describes the treatment choices: Medical Management, Angioplasty, or Coronary Bypass Surgery for treating various heart conditions including Myocardial Infarction, Angina, Valve Disease, and Cardiac Arrhythmias.  Written material given at graduation. Flowsheet Row Cardiac Rehab from  09/02/2018 in [facility] Cardiac and Pulmonary Rehab  Date 08/07/18  Educator CE  Instruction Review Code 1- Verbalizes Understanding      Medication Safety: - Group verbal and visual instruction to review commonly prescribed medications for heart and lung disease. Reviews the medication, class of the drug, and side effects. Includes the steps to properly store meds and maintain the prescription regimen.  Written material given at graduation. Flowsheet Row Cardiac Rehab from 09/02/2018 in [facility] Cardiac and Pulmonary Rehab  Date 08/12/18  Educator SB  Instruction Review Code 1- Verbalizes Understanding      Other: -Provides group and verbal instruction on various topics (see comments)   Knowledge Questionnaire Score:    Core Components/Risk Factors/Patient Goals at Admission:   Education:Diabetes - Individual verbal and written instruction to review signs/symptoms of diabetes, desired ranges of glucose level fasting, after meals and with exercise. Acknowledge that pre and post exercise glucose checks will be done for 3 sessions at entry of program. Flowsheet Row Cardiac Rehab from 09/02/2018 in [facility] Cardiac and Pulmonary Rehab  Date 05/01/18  Educator [facility]  Instruction Review Code 1- Verbalizes Understanding      Know Your Numbers and Heart Failure: - Group verbal and visual instruction to discuss disease risk factors for cardiac and pulmonary disease and treatment options.  Reviews associated critical values for Overweight/Obesity, Hypertension, Cholesterol, and Diabetes.  Discusses basics of heart failure: signs/symptoms and treatments.  Introduces Heart Failure Zone chart for action plan for heart failure.  Written material given at graduation. Flowsheet Row Cardiac Rehab from 09/02/2018 in [facility] Cardiac and Pulmonary Rehab  Date 08/07/18  Educator CE  Instruction Review Code 1- Verbalizes Understanding      Core Components/Risk Factors/Patient Goals Review:   Goals and Risk Factor  Review    Row Name 11/30/20 1121 12/26/20 1123           Core Components/Risk Factors/Patient Goals Review   Personal Goals Review Improve shortness of breath with ADL's;Diabetes;Weight Management/Obesity;Hypertension Improve shortness of breath with ADL's;Diabetes;Weight Management/Obesity;Hypertension      Review Harold Parker is officially two years Parker from qutting smoking.  His breathing is getting better.  His sugars have been doing well overall.  His pressures continue to do well for him. Harold [facility] reports taking all medications and monitoring blood sugars at home. His weight has been steady and he manages his SOB with O2 as needed depending on temperature and air pressure.      Expected Outcomes Short: Continue to work on breathing  Long: Continue to manage risk factors. Short: continue to work on purse lip breathing to manage SOB and continue to take  all medications as prescribed. Long: Continue heart healthy lifestyle to manage cardiac risk factors.             Core Components/Risk Factors/Patient Goals at Discharge (Final Review):   Goals and Risk Factor Review - 12/26/20 1123      Core Components/Risk Factors/Patient Goals Review   Personal Goals Review Improve shortness of breath with ADL's;Diabetes;Weight Management/Obesity;Hypertension    Review Harold [facility] reports taking all medications and monitoring blood sugars at home. His weight has been steady and he manages his SOB with O2 as needed depending on temperature and air pressure.    Expected Outcomes Short: continue to work on purse lip breathing to manage SOB and continue to take all medications as prescribed. Long: Continue heart healthy lifestyle to manage cardiac risk factors.           ITP Comments:  ITP Comments    Row Name 11/16/20 1043 12/14/20 0632 01/11/21 0945 01/11/21 1202 01/23/21 1002   ITP Comments 30 Day review completed. Medical Director ITP review done, changes made as directed, and signed approval by Medical Director.  30 Day review completed. Medical Director ITP review done, changes made as directed, and signed approval by Medical Director. 30 Day review completed. Medical Director ITP review done, changes made as directed, and signed approval by Medical Director. Harold [facility] with sick - diagnosed with COVID 01/04/21. Harold [facility] to the Parker with COVID pneumonia and respitory failure due to COVID. Still Parker with potential d/c today.   Row Name 02/08/21 0704 02/08/21 1356 02/15/21 1514 02/22/21 1025 03/08/21 1013   ITP Comments 30 Day review completed. Medical Director ITP review done, changes made as directed, and signed approval by Medical Director. Harold Parker since last review.  He is currently back in the Parker for continued respiratory issues. Called to check on pt.  He has been in Parker in and Parker with COVID and respiratory failure.  He is still feeling pretty Parker of it and very fatigue.  He is staying Parker of the public for right now. He is not quite ready to come back yet and wants to hold Parker for two weeks. He did sign a DNR yesterday. Mattie is supposed to return next week. 30 Day review completed. Medical Director ITP review done, changes made as directed, and signed approval by Medical Director.  Has not returned yet   Row Name 03/21/21 6045 04/05/21 0625 04/05/21 1128 04/24/21 1539 05/02/21 1412   ITP Comments Rayna Sexton saw his pulmonologist yesterday and they want him back in rehab as soon as he finishes up with home PT.  We will touch base with him again in two weeks if we have not heard from him before then. 30 Day review completed. Medical Director ITP review done, changes made as directed, and signed approval by Medical Director. remains Parker for medical reasons Rollan plans to return when he finishes home health PT. Called to check on Olin. He is still recovering and getting PT.  He plans to return on 05/08/21.  I did tell him that if we go beyond this month, we will  need to discharge and get a new referral.  He stated understanding. Payne has not Parker due to illness.  He is scheduled to return next week.   Row Name 05/03/21 0751 05/08/21 1631         ITP Comments 30 Day review completed. Medical Director ITP review done, changes made as directed, and signed approval  by Medical Director. Pt was supposed to return today in order to continue in program.  He did not attend.  We will discharge him at this time.             Comments: Discharge ITP (not Parker since 12/28/20)

## 2021-05-10 ENCOUNTER — Telehealth: Payer: Self-pay

## 2021-05-10 DIAGNOSIS — J449 Chronic obstructive pulmonary disease, unspecified: Secondary | ICD-10-CM

## 2021-05-10 NOTE — Telephone Encounter (Signed)
ONO reviewed by Dr. Fredrich Romans increasing to 3.5L QHS. Patient is aware of results and voiced his understanding.  Order has been placed to [facility].  Nothing further needed at this time.

## 2021-05-26 ENCOUNTER — Ambulatory Visit (INDEPENDENT_AMBULATORY_CARE_PROVIDER_SITE_OTHER): Payer: Medicare Other | Admitting: Cardiovascular Disease

## 2021-05-26 ENCOUNTER — Other Ambulatory Visit: Payer: Self-pay

## 2021-05-26 ENCOUNTER — Encounter: Payer: Self-pay | Admitting: Cardiovascular Disease

## 2021-05-26 VITALS — BP 140/60 | HR 59 | Ht 63.0 in | Wt 171.4 lb

## 2021-05-26 DIAGNOSIS — I2510 Atherosclerotic heart disease of native coronary artery without angina pectoris: Secondary | ICD-10-CM

## 2021-05-26 DIAGNOSIS — J449 Chronic obstructive pulmonary disease, unspecified: Secondary | ICD-10-CM

## 2021-05-26 DIAGNOSIS — Z953 Presence of xenogenic heart valve: Secondary | ICD-10-CM

## 2021-05-26 DIAGNOSIS — I5022 Chronic systolic (congestive) heart failure: Secondary | ICD-10-CM | POA: Diagnosis not present

## 2021-05-26 DIAGNOSIS — I739 Peripheral vascular disease, unspecified: Secondary | ICD-10-CM | POA: Diagnosis not present

## 2021-05-26 DIAGNOSIS — Z951 Presence of aortocoronary bypass graft: Secondary | ICD-10-CM

## 2021-05-26 DIAGNOSIS — E785 Hyperlipidemia, unspecified: Secondary | ICD-10-CM

## 2021-05-26 DIAGNOSIS — I10 Essential (primary) hypertension: Secondary | ICD-10-CM

## 2021-05-26 MED ORDER — METOPROLOL SUCCINATE ER 50 MG PO TB24: 50.0000 mg | ORAL_TABLET | Freq: Every day | ORAL | 2 refills | Status: DC

## 2021-05-26 NOTE — Patient Instructions (Signed)
Medication Instructions:  Your physician has recommended you make the following change in your medication:   REDUCE Metoprolol Succinate to 5 mg daily.    *If you need a refill on your cardiac medications before your next appointment, please call your pharmacy*   Lab Work: None ordered If you have labs (blood work) drawn today and your tests are completely normal, you will receive your results only by: MyChart Message (if you have MyChart) OR A paper copy in the mail If you have any lab test that is abnormal or we need to change your treatment, we will call you to review the results.   Testing/Procedures: None ordered   Follow-Up: At [facility], you and your health needs are our priority.  As part of our continuing mission to provide you with exceptional heart care, we have created designated Provider Care Teams.  These Care Teams include your primary Cardiologist (physician) and Advanced Practice Providers (APPs -  Physician Assistants and Nurse Practitioners) who all work together to provide you with the care you need, when you need it.  We recommend signing up for the patient portal called "MyChart".  Sign up information is provided on this After Visit Summary.  MyChart is used to connect with patients for Virtual Visits (Telemedicine).  Patients are able to view lab/test results, encounter notes, upcoming appointments, etc.  Non-urgent messages can be sent to your provider as well.   To learn more about what you can do with MyChart, go to [email or web address].     Follow Up: Your physician wants you to follow-up in: 6 months You will receive a reminder letter in the mail two months in advance. If you don't receive a letter, please call our office to schedule the follow-up appointment.  The format for your next appointment:   In Person  Provider:   You may see [name], MD or one of the following Advanced Practice Providers on your designated Care Team:    Nicolasa Ducking, NP Eula Listen, PA-C Marisue Ivan, PA-C Cadence [place], [place] Gillian Shields, NP   Other Instructions N/A

## 2021-05-26 NOTE — Progress Notes (Signed)
Cardiology Office Note   Date:  05/26/2021   ID:  Harold Parker, DOB 10-03-1946, MRN [id number]  PCP:  Merlene Laughter, MD  Cardiologist:   [name], MD  Chief Complaint  Patient presents with   Other    3 month f/u c/o sob with exertion and burning sensation in the legs.  Meds reviewed verbally with pt.       History of Present Illness: Harold Parker is a 75 y.o. male who presents for a follow-up visit regarding peripheral arterial disease, coronary artery disease and aortic stenosis status post CABG and bioprosthetic aortic valve in March 2019.  Other medical problems include essential hypertension, hyperlipidemia, type 2 diabetes, COPD and previous tobacco use.   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   He presented in February of 2019 with non-ST elevation myocardial infarction.  Cardiac catheterization revealed severe left main stenosis in addition to significant disease involving OM1 and RCA.  EF was 40 to 45% with moderate aortic stenosis.  The patient underwent CABG and aortic valve replacement. He had postoperative atrial fibrillation that was controlled with amiodarone.  He was discharged home on aspirin and Plavix.  He presented back with an upper GI bleed with a hemoglobin 4.7.  EGD showed duodenal ulcers.   The patient is known to have bilateral leg claudication.  Angiography in March 2020 showed no significant aortoiliac disease.  On the right, there was long heavily calcified occlusion of the SFA with very well-developed collaterals from the profunda and three-vessel runoff below the knee.  On the left, there was heavily calcified disease affecting the left SFA with three-vessel runoff below the knee.  I performed successful orbital atherectomy and drug-coated balloon angioplasty to the left SFA.  He was hospitalized in February of this year with COVID-19 pneumonia.  He was treated with steroids and remdesivir.  His  oxygen requirement increased to 4 L/min.  He was rehospitalized the same month with continued shortness of breath.  He was again treated with steroids.  Oxygen could not be decreased to below 4 L and it was established that this is his new baseline after COVID-19 infection. He had an echocardiogram done during his hospitalization which showed an EF of 40 to 45%, moderate pulmonary hypertension with estimated RV systolic pressure of 58 mmHg and mild mitral regurgitation.  Reports improvement in shortness of breath overall.  Oxygen requirement went down from 4 L to 3-1/2 L.  No chest pain.  He reports some numbness and burning sensation in the front of both legs that improves with walking.  No calf claudication.  He does get dizzy when he stands up quickly.  No syncope.  Is planning to visit [place] this summer.  Past Medical History:  Diagnosis Date   Bilateral carotid bruits    a. 01/2018 U/S: < 50% bilat ICA stenosis.   CAD (coronary artery disease)    a. 1998 s/p MI and BMS [facility], [place]); b. 1999 redo PCI/rotablator in setting of what sounds like ISR;  c. Multiple stress tests over the years - last ~ 2017, reportedly nl; d. 01/2018 NSTEMI/Cath: LM 73m/d, LAD 50p, 40p/m, D1 60ost, OM1 95, RCA 100ost/p w/ L->R collats, EF 45%; e. s/p 3V CABG 02/19/18 (LIMA-LAD, VG-D1, VG-OM)   Chronic lower back pain    COPD (chronic obstructive pulmonary disease) (HCC)    GIB (gastrointestinal bleeding)    a. 02/2018 GIB and anemia w/ Hgb  of 4.7 on presentation; b. 03/2018 EGD: 2 nonbleeding duodenal ulcers.   HTN (hypertension)    Hypercholesteremia    Ischemic cardiomyopathy    a. 01/2018 Echo: EF 40-45%, mid-apicalanteroseptal, ant, apical sev HK, mod apicalinferior HK. Gr2 DD. Mod AS, mild MR, mod dil LA, PASP .   Moderate aortic stenosis    a. 01/2018 Echo: Mod AS, mean grad (S) , Valve area (VTI) 1.06 cm^2, (Vmax) 1.27 cm^2; b. 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   Myocardial infarction [facility]) ~  1998/1999   S/P aortic valve replacement with bioprosthetic valve 02/19/2018   a. 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   LIMA to LAD, SVG to D1, SVG to OM, EVH via right thigh and leg   Tobacco abuse    Type II diabetes mellitus (HCC)     Past Surgical History:  Procedure Laterality Date   ABDOMINAL AORTOGRAM N/A 03/04/2019   Procedure: ABDOMINAL AORTOGRAM;  Surgeon: Iran Ouch, MD;  Location: [facility] INVASIVE CV LAB;  Service: Cardiovascular;  Laterality: N/A;   AORTIC VALVE REPLACEMENT N/A 02/19/2018   Procedure: AORTIC VALVE REPLACEMENT (AVR);  Surgeon: Purcell Nails, MD;  Location: [facility] OR;  Service: Open Heart Surgery;  Laterality: N/A;   COLONOSCOPY     CORONARY ANGIOPLASTY WITH STENT PLACEMENT  ~ 1998/1999   CORONARY ARTERY BYPASS GRAFT N/A 02/19/2018   Procedure: CORONARY ARTERY BYPASS GRAFTING (CABG) x three , using left internal mammary artery and right leg greater saphenous vein harvested endoscopically;  Surgeon: Purcell Nails, MD;  Location: [facility] OR;  Service: Open Heart Surgery;  Laterality: N/A;   ESOPHAGOGASTRODUODENOSCOPY (EGD) WITH PROPOFOL N/A 03/18/2018   Procedure: ESOPHAGOGASTRODUODENOSCOPY (EGD) WITH PROPOFOL;  Surgeon: Midge Minium, MD;  Location: [facility] ENDOSCOPY;  Service: Endoscopy;  Laterality: N/A;   LEFT HEART CATH AND CORONARY ANGIOGRAPHY N/A 02/06/2018   Procedure: LEFT HEART CATH AND CORONARY ANGIOGRAPHY;  Surgeon: Iran Ouch, MD;  Location: [facility] INVASIVE CV LAB;  Service: Cardiovascular;  Laterality: N/A;   LOWER EXTREMITY ANGIOGRAPHY Bilateral 03/04/2019   Procedure: Lower Extremity Angiography;  Surgeon: Iran Ouch, MD;  Location: [facility] INVASIVE CV LAB;  Service: Cardiovascular;  Laterality: Bilateral;   PERIPHERAL VASCULAR ATHERECTOMY Left 03/04/2019   Procedure: PERIPHERAL VASCULAR ATHERECTOMY;  Surgeon: Iran Ouch, MD;  Location: [facility] INVASIVE CV LAB;  Service:  Cardiovascular;  Laterality: Left;  SFA   TEE WITHOUT CARDIOVERSION N/A 02/19/2018   Procedure: TRANSESOPHAGEAL ECHOCARDIOGRAM (TEE);  Surgeon: Purcell Nails, MD;  Location: [facility] OR;  Service: Open Heart Surgery;  Laterality: N/A;   TONSILLECTOMY       Current Outpatient Medications  Medication Sig Dispense Refill   acetaminophen (TYLENOL) 500 MG tablet Take 1,000 mg by mouth daily as needed for moderate pain or headache.     albuterol (PROVENTIL) (2.5 MG/3ML) 0.083% nebulizer solution Take 3 mLs (2.5 mg total) by nebulization every 6 (six) hours as needed for wheezing or shortness of breath. 75 mL 12   ALPRAZolam (XANAX) 0.5 MG tablet Take 0.5 mg by mouth 2 (two) times daily.     aspirin EC 81 MG tablet Take 81 mg by mouth daily.     budesonide (PULMICORT) 0.5 MG/2ML nebulizer solution Take 2 mLs (0.5 mg total) by nebulization 2 (two) times daily. Use AFTER Perforomist 120 mL 11   busPIRone (BUSPAR) 10 MG tablet Take 10 mg by mouth 2 (two) times daily.  esomeprazole (NEXIUM) 40 MG capsule Take 40 mg by mouth at bedtime.      ezetimibe (ZETIA) 10 MG tablet Take 10 mg by mouth at bedtime.      folic acid (FOLVITE) 400 MCG tablet Take 400 mcg by mouth every evening.     formoterol (PERFOROMIST) 20 MCG/2ML nebulizer solution Take 2 mLs (20 mcg total) by nebulization 2 (two) times daily. 120 mL 11   furosemide (LASIX) 40 MG tablet Take 40 mg by mouth 2 (two) times daily.     glipiZIDE (GLUCOTROL) 5 MG tablet Take 0.5 tablets (2.5 mg total) by mouth daily before breakfast. (Patient taking differently: Take 5 mg by mouth See admin instructions. Take 5 mg in the morning and take a second 5 mg dose at night on Mon, Wed, and Fri) 30 tablet 1   guaiFENesin (MUCINEX) 600 MG 12 hr tablet Take 1,200 mg by mouth every 12 (twelve) hours as needed.     losartan (COZAAR) 100 MG tablet Take 100 mg by mouth daily.     metFORMIN (GLUCOPHAGE) 1000 MG tablet TAKE 1 TABLET BY MOUTH TWICE DAILY WITH A MEAL 60  tablet 0   PROAIR HFA 108 (90 Base) MCG/ACT inhaler Inhale 1 puff into the lungs every 4 (four) hours as needed for wheezing or shortness of breath.     revefenacin (YUPELRI) 175 MCG/3ML nebulizer solution Take 3 mLs (175 mcg total) by nebulization daily. Can be mixed with Perforomist 90 mL 11   traZODone (DESYREL) 50 MG tablet Take 50 mg by mouth at bedtime as needed.     Vitamin D, Cholecalciferol, 1000 units TABS Take 1,000 Units by mouth every morning.      metoprolol succinate (TOPROL-XL) 50 MG 24 hr tablet Take 1 tablet (50 mg total) by mouth daily. 90 tablet 2   No current facility-administered medications for this visit.    Allergies:   Atorvastatin    Social History:  The patient  reports that he quit smoking about 3 years ago. His smoking use included cigarettes. He has a 40.00 pack-year smoking history. He has never used smokeless tobacco. He reports current alcohol use. He reports that he does not use drugs.   Family History:  The patient's family history includes Lymphoma in his mother; Peripheral vascular disease in his father.    ROS:  Please see the history of present illness.   Otherwise, review of systems are positive for none.   All other systems are reviewed and negative.    PHYSICAL EXAM: VS:  BP 140/60 (BP Location: Left Arm, Patient Position: Sitting, Cuff Size: Normal)   Pulse (!) 59   Ht 5\' 3"  (1.6 m)   Wt 171 lb 6 oz (77.7 kg)   SpO2 97%   BMI 30.36 kg/m  , BMI Body mass index is 30.36 kg/m. GEN: Well nourished, well developed, in no acute distress  HEENT: normal  Neck: no JVD, carotid bruits, or masses Cardiac: RRR; no  rubs, or gallops,no edema, no murmur. Respiratory: Mild bilateral rhonchi and diminished breath sounds. GI: soft, nontender, nondistended, + BS MS: no deformity or atrophy  Skin: warm and dry, no rash Neuro:  Strength and sensation are intact Psych: euthymic mood, full affect    EKG:  EKG is ordered today. The ekg ordered today  demonstrates sinus bradycardia with right bundle branch block and left anterior fascicular block.  Possible old inferior infarct.   Recent Labs: 02/04/2021: ALT 34; TSH 0.627 02/09/2021: B Natriuretic Peptide 196.8;  BUN 33; Creatinine, Ser 1.09; Hemoglobin 12.8; Magnesium 1.9; Platelets 310; Potassium 3.5; Sodium 135    Lipid Panel    Component Value Date/Time   CHOL 90 01/12/2021 0445   CHOL 102 03/27/2018 0900   TRIG 87 01/12/2021 0445   HDL 28 (L) 01/12/2021 0445   HDL 35 (L) 03/27/2018 0900   CHOLHDL 3.2 01/12/2021 0445   VLDL 17 01/12/2021 0445   LDLCALC 45 01/12/2021 0445   LDLCALC 50 03/27/2018 0900      Wt Readings from Last 3 Encounters:  05/26/21 171 lb 6 oz (77.7 kg)  05/02/21 173 lb 6.4 oz (78.7 kg)  03/20/21 174 lb 3.2 oz (79 kg)       No flowsheet data found.    ASSESSMENT AND PLAN:  1.  Peripheral arterial disease:  Status post atherectomy and drug-coated balloon angioplasty of the left SFA.  He has mild bilateral calf claudication.  Most recent vascular studies in October 2021 showed an ABI in the 0.7 range with patent left SFA.   2. Coronary artery disease involving native coronary arteries without angina: Status post CABG in March 2019.  He is doing better overall.   3.  Status post bioprosthetic aortic valve replacement for aortic stenosis: Most recent echo showed normal functioning bioprosthetic aortic valve.   4.  Chronic systolic heart failure with mildly reduced LV systolic function.  Continue treatment with Toprol and losartan.  He appears to be euvolemic on current dose of furosemide 40 mg daily.     5.  Hyperlipidemia: He is tolerating high-dose atorvastatin and Zetia.   Recommend a target LDL of less than 70.  6.  Essential hypertension: Blood pressure is reasonably controlled but he is bradycardic with bifascicular block.  He does complain of orthostatic dizziness.  Thus, I elected to decrease the dose of Toprol to 50 mg daily.  The patient was  on amlodipine in the past and we can resume the medication if needed for blood pressure control.  7.  COPD: Worsened O2 requirement after COVID infection in February.  The patient is improving slowly.      Disposition:   FU with me in 6 month   Signed, [name], MD 05/26/21 [facility] [place], [place] [phone]

## 2021-06-14 ENCOUNTER — Telehealth: Payer: Self-pay | Admitting: Pulmonary Disease

## 2021-06-14 NOTE — Telephone Encounter (Signed)
Spoke to patient, who is questioning if it's okay to take CBD gummies.   Dr. Jayme Cloud, please advise.

## 2021-06-14 NOTE — Telephone Encounter (Signed)
Patient is aware of below message and voiced his understanding.  Nothing further needed.   

## 2021-06-14 NOTE — Telephone Encounter (Signed)
He will have to be aware that these are not without side effects and these can include diarrhea, appetite loss, irritability, agitation, drowsiness, fatigue and sleepiness.  He would have to watch out for these side effects.

## 2021-06-21 ENCOUNTER — Ambulatory Visit
Admission: RE | Admit: 2021-06-21 | Discharge: 2021-06-21 | Disposition: A | Discharge status: Home / Self Care | Payer: Medicare Other | Source: Ambulatory Visit | Attending: Adult Health | Admitting: Adult Health

## 2021-06-21 ENCOUNTER — Other Ambulatory Visit: Payer: Self-pay

## 2021-06-21 DIAGNOSIS — R911 Solitary pulmonary nodule: Secondary | ICD-10-CM | POA: Diagnosis present

## 2021-08-15 ENCOUNTER — Other Ambulatory Visit: Payer: Self-pay

## 2021-08-15 ENCOUNTER — Ambulatory Visit (INDEPENDENT_AMBULATORY_CARE_PROVIDER_SITE_OTHER): Payer: Medicare Other | Admitting: Pulmonary Disease

## 2021-08-15 ENCOUNTER — Encounter: Payer: Self-pay | Admitting: Pulmonary Disease

## 2021-08-15 VITALS — BP 120/82 | HR 83 | Temp 97.8°F | Ht 64.0 in | Wt 171.8 lb

## 2021-08-15 DIAGNOSIS — Z87891 Personal history of nicotine dependence: Secondary | ICD-10-CM

## 2021-08-15 DIAGNOSIS — J449 Chronic obstructive pulmonary disease, unspecified: Secondary | ICD-10-CM | POA: Diagnosis not present

## 2021-08-15 DIAGNOSIS — I5022 Chronic systolic (congestive) heart failure: Secondary | ICD-10-CM

## 2021-08-15 DIAGNOSIS — J9611 Chronic respiratory failure with hypoxia: Secondary | ICD-10-CM

## 2021-08-15 NOTE — Patient Instructions (Addendum)
Continue nebulizers as you are doing  We can write a prescription for your portable concentrator once you find out which portable concentrator you want.  Usually the company sends [place] a request and then we send the order in.  We are sending an order to [facility]  Follow-up in 3 months time call sooner should any new problems arise

## 2021-08-15 NOTE — Progress Notes (Signed)
Subjective:    Patient ID: Harold Parker, male    DOB: 1946-03-10, 75 y.o.   MRN: [id number] Chief Complaint  Patient presents with   Follow-up    Sob, coughing w yellow phlegm    HPI Thi is a 75 year old former smoker (40 PY) who follows here for stage III severe COPD by GOLD criteria and chronic hypoxic respiratory failure on supplemental oxygen.  This is a scheduled visit.  Last seen on 02 May 2021 by Rubye Oaks, NP.  Recall he had a prolonged hospitalization in January and February of this year due to severe COVID-19 infection complicated by COVID pneumonia.  Since his last visit he has noted that he has done very well with pulmonary rehab and can perform his activities of daily living without any difficulty as long as he wears his supplemental oxygen.  He is on Perforomist and budesonide twice a day as well as Yupelri via nebulizer.  Prior to COVID the patient was found oxygen at 2 L/min now he requires oxygen at 2 L/min at rest and 4 L with exertion.  He would like to try for another portable oxygen concentrator as his oxygen requirements have decreased however he may not meet criteria of pulsed O2.  His shortness of breath is at baseline.  No increase and definitely improved after pulmonary rehab.  He voices no acute complaints today.  He has been compliant with the oxygen and notes improvement on his activity level with supplemental oxygen.  No fever, chills or sweats, no lower extremity edema.  No orthopnea or paroxysmal nocturnal dyspnea.  Continues to be abstinent of cigarettes.   Review of Systems A 10 point review of systems was performed and it is as noted above otherwise negative.  Patient Active Problem List   Diagnosis Date Noted   Chronic respiratory failure with hypoxia (HCC) 05/02/2021   Insomnia    History of COVID-19 pneumonia 01/11/21 02/04/2021   Pulmonary nodules    Acute respiratory disease due to COVID-19 virus 01/11/2021   HTN (hypertension)    Chronic  systolic CHF (congestive heart failure) (HCC)    COPD (chronic obstructive pulmonary disease) (HCC)    GERD (gastroesophageal reflux disease)    Anxiety    AKI (acute kidney injury) (HCC)    Hyperkalemia    Elevated troponin    Acute hypoxemic respiratory failure due to COVID-19 [facility])    Melena    Duodenal ulceration    Gastrointestinal hemorrhage    Severe anemia 03/14/2018   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   S/P aortic valve replacement with bioprosthetic valve  02/19/2018   Preoperative respiratory examination 02/13/2018   Acute on chronic respiratory failure with hypoxia (HCC) 02/11/2018   Influenza with respiratory manifestation 02/11/2018   Stage 3 severe COPD by GOLD classification (HCC)    Aortic stenosis    Bronchitis, chronic obstructive, with exacerbation (HCC)    Tobacco abuse    CAD (coronary artery disease) 02/07/2018   Acute systolic CHF (congestive heart failure) (HCC) 02/07/2018   Ischemic cardiomyopathy 02/07/2018   Type 2 diabetes mellitus with hyperglycemia, without long-term current use of insulin (HCC) 02/07/2018   Hyperlipidemia LDL goal <70 02/07/2018   COPD with acute exacerbation (HCC) 02/05/2018   NSTEMI (non-ST elevated myocardial infarction) (HCC) 02/05/2018   Social History   Tobacco Use   Smoking status: Former    Packs/day: 1.00    Years: 40.00    Pack years: 40.00    Types: Cigarettes  Quit date: 02/02/2018    Years since quitting: 3.8   Smokeless tobacco: Never   Tobacco comments:    Quit 01/2018 - on 05/01/2018 talked to Julion about Relaspe concerns. He ststaes that he has no taste for tobacco since he quit in Feb.  Substance Use Topics   Alcohol use: Yes    Comment: occ   Allergies  Allergen Reactions   Atorvastatin Other (See Comments)    Leg aches and weakness    Current Meds  Medication Sig   acetaminophen (TYLENOL) 500 MG tablet Take 1,000 mg by mouth daily as needed for moderate pain or headache.   albuterol (PROVENTIL) (2.5  MG/3ML) 0.083% nebulizer solution Take 3 mLs (2.5 mg total) by nebulization every 6 (six) hours as needed for wheezing or shortness of breath.   ALPRAZolam (XANAX) 0.5 MG tablet Take 0.5 mg by mouth 2 (two) times daily.   aspirin EC 81 MG tablet Take 81 mg by mouth daily.   budesonide (PULMICORT) 0.5 MG/2ML nebulizer solution Take 2 mLs (0.5 mg total) by nebulization 2 (two) times daily. Use AFTER Perforomist   busPIRone (BUSPAR) 10 MG tablet Take 10 mg by mouth 2 (two) times daily.   esomeprazole (NEXIUM) 40 MG capsule Take 40 mg by mouth at bedtime.    ezetimibe (ZETIA) 10 MG tablet Take 10 mg by mouth at bedtime.    formoterol (PERFOROMIST) 20 MCG/2ML nebulizer solution Take 2 mLs (20 mcg total) by nebulization 2 (two) times daily.   furosemide (LASIX) 40 MG tablet Take 40 mg by mouth 2 (two) times daily.   glipiZIDE (GLUCOTROL) 5 MG tablet Take 0.5 tablets (2.5 mg total) by mouth daily before breakfast. (Patient taking differently: Take 5 mg by mouth See admin instructions. Take 5 mg in the morning and take a second 5 mg dose at night on Mon, Wed, and Fri)   guaiFENesin (MUCINEX) 600 MG 12 hr tablet Take 1,200 mg by mouth every 12 (twelve) hours as needed.   losartan (COZAAR) 100 MG tablet Take 100 mg by mouth daily.   metFORMIN (GLUCOPHAGE) 1000 MG tablet TAKE 1 TABLET BY MOUTH TWICE DAILY WITH A MEAL   metoprolol succinate (TOPROL-XL) 50 MG 24 hr tablet Take 1 tablet (50 mg total) by mouth daily.   PROAIR HFA 108 (90 Base) MCG/ACT inhaler Inhale 1 puff into the lungs every 4 (four) hours as needed for wheezing or shortness of breath.   revefenacin (YUPELRI) 175 MCG/3ML nebulizer solution Take 3 mLs (175 mcg total) by nebulization daily. Can be mixed with Perforomist   traZODone (DESYREL) 50 MG tablet Take 50 mg by mouth at bedtime as needed.   Vitamin D, Cholecalciferol, 1000 units TABS Take 1,000 Units by mouth every morning.    [DISCONTINUED] folic acid (FOLVITE) 400 MCG tablet Take 400 mcg  by mouth every evening.   Immunization History  Administered Date(s) Administered   Influenza Inj Mdck Quad Pf 12/02/2017   Influenza Split 12/11/2014, 11/01/2019   Influenza, High Dose Seasonal PF 09/16/2018, 08/21/2019, 11/01/2020   Influenza-Unspecified 09/18/2021   PFIZER(Purple Top)SARS-COV-2 Vaccination 02/06/2020, 02/29/2020      Objective:   Physical Exam BP 120/82 (BP Location: Left Arm, Patient Position: Sitting, Cuff Size: Normal)   Pulse 83   Temp 97.8 F (36.6 C) (Oral)   Ht 5\' 4"  (1.626 m)   Wt 171 lb 12.8 oz (77.9 kg)   SpO2 95%   BMI 29.49 kg/m  GENERAL: Chronically ill-appearing man, well nourished, well developed. No  conversational dyspnea. Comfortable with nasal cannula O2.   HEAD: Normocephalic, atraumatic.  EYES: Pupils equal, round, reactive to light.  No scleral icterus.  MOUTH: Oral mucosa moist.  No thrush. NECK: Supple. No thyromegaly. Trachea midline. No JVD.  No adenopathy. PULMONARY: Distant breath sounds.  Coarse breath sounds with no other adventitious sounds. CARDIOVASCULAR: S1 and S2. Regular rate and rhythm.  1/6 systolic murmur at the lower left sternal border. ABDOMEN: Benign. MUSCULOSKELETAL: No joint deformity, no clubbing, no edema.  NEUROLOGIC: No overt focal deficit, speech is fluent. SKIN: Intact,warm,dry.  On limited exam no rashes. PSYCH: Mood and behavior normal     Assessment & Plan:     ICD-10-CM   1. Stage 3 severe COPD by GOLD classification (HCC)  J44.9 AMB REFERRAL FOR DME   Continue Perforomist, budesonide and Yupelri Continue as needed albuterol Follow-up in 3 months time    2. Chronic respiratory failure with hypoxia (HCC)  J96.11    Continue oxygen at 2 L/min with rest and sleep Continue oxygen at 4 L/min with exertion Patient compliant with therapy Referral to [facility] for POC    3. Chronic systolic CHF (congestive heart failure) (HCC)  I50.22    This issue adds complexity to his management Follows with  cardiology for this issue Last LVEF 35 to 40%    4. Former heavy tobacco smoker  Z87.891    No evidence of relapse     Orders Placed This Encounter  Procedures   AMB REFERRAL FOR DME    Referral Priority:   Routine    Referral Type:   Durable Medical Equipment Purchase    Number of Visits Requested:   1   Patient has severe stage III COPD.  He is however as compensated as he is to get.  Continue current regimen.  We will see him in follow-up in 3 months time he is to contact us prior to that time should any new difficulties arise.  Gailen Shelter, MD [facility]    *This note was dictated using voice recognition software/Dragon.  Despite best efforts to proofread, errors can occur which can change the meaning.  Any change was purely unintentional.

## 2021-09-26 ENCOUNTER — Telehealth: Payer: Self-pay | Admitting: Pulmonary Disease

## 2021-09-26 MED ORDER — DOXYCYCLINE HYCLATE 100 MG PO TABS: 100.0000 mg | ORAL_TABLET | Freq: Two times a day (BID) | ORAL | 0 refills | Status: DC

## 2021-09-26 MED ORDER — PREDNISONE 10 MG (21) PO TBPK: ORAL_TABLET | ORAL | 0 refills | Status: DC

## 2021-09-26 NOTE — Telephone Encounter (Signed)
Recommend prednisone taper pack and doxycycline 100 mg twice daily x7 days.

## 2021-09-26 NOTE — Telephone Encounter (Signed)
Spoke to patient, who reports of prod cough with light green sputum, headache and sinus pressure x5d Sob is baseline.  Denies f/c/s or additional sx. He is using mucinex once daily, perforomist BID, Yupelri once daily and pulmicort BID.  No recent coivd test. Fully vaccinated against covid.  He wears 3L QHS and PRN. Spo2 is maintaining between 88-93% on 3L.  Dr. Jayme Cloud, please advise. Thanks.

## 2021-09-26 NOTE — Telephone Encounter (Signed)
Patient is aware of below recommendations and voiced his understanding.  Doxy and prednisone has been sent to preferred pharmacy.  Nothing further needed at this time.

## 2021-09-28 NOTE — Telephone Encounter (Signed)
Recommend holding off on remainder prednisone.  Watch sugar intake.  Make sure he completes the antibiotic.  If his sugars remain high recommend calling PCP.

## 2021-09-28 NOTE — Telephone Encounter (Signed)
Spoke to patient, who states since starting prednisone taper his blood sugar has been high.  Sugars are running mid to high 200's.  He is taking Glipizide and metformin.  3 days left of prednisone.  He has not contact PCP.   Dr. Jayme Cloud, please advise. Thanks

## 2021-09-28 NOTE — Telephone Encounter (Signed)
Patient is aware of below message and voiced his understanding.  Nothing further needed at this time.   

## 2021-10-26 ENCOUNTER — Telehealth: Payer: Self-pay | Admitting: Pulmonary Disease

## 2021-10-26 MED ORDER — COMPRESSOR/NEBULIZER MISC: 0 refills | Status: DC

## 2021-10-26 NOTE — Telephone Encounter (Signed)
Harold Parker calling for pt- pt nebulizer has not been working the last few days. Pt is in [place] on vacation. Pt it on 3 nebulizer meds and is experiencing shakes possibly from not getting meds. Wanting a rx sent to [place] in [place]. Please advise [phone]

## 2021-10-26 NOTE — Telephone Encounter (Signed)
Harold Parker is returning phone call. Harold Parker phone number is [phone].

## 2021-10-26 NOTE — Telephone Encounter (Signed)
Rx sent for nebulizer machine.  Patient's friend, [name] stated that patient has already went and picked up machine.  Nothing further needed.

## 2021-10-26 NOTE — Telephone Encounter (Signed)
Lm x1 for Carol(DPR).

## 2021-10-26 NOTE — Telephone Encounter (Signed)
Spoke to patient's friend, carol(DPR). [name] stated that patient is currently in [facility] on vacation.  He is using perforomist BID, Pulmicort BID and Yupelri once daily. His nebulizer is broken and he has been without meds for three days.  Since being without, he has developed increased sob, shakes and chills. Denied f/c/s or additional sx.   Can not tolerate prednisone due to high sugars.   He is requesting a Rx for nebulizer machine to be sent to [facility]. Rx has been sent.  Dr. Belia Heman, please advise. Dr. Jayme Cloud is unavailable. Thanks

## 2021-10-29 ENCOUNTER — Other Ambulatory Visit: Payer: Self-pay | Admitting: Pulmonary Disease

## 2021-10-29 DIAGNOSIS — U071 COVID-19: Secondary | ICD-10-CM

## 2021-10-29 MED ORDER — LAGEVRIO 200 MG PO CAPS: 4.0000 | ORAL_CAPSULE | Freq: Two times a day (BID) | ORAL | 0 refills | Status: DC

## 2021-10-29 NOTE — Progress Notes (Signed)
Patient called answering service.  Returned from [place] last night.  Was feeling poorly and checked a COVID 19 home test and it was positive.  Feels mild shortness of breath over baseline however oxygen saturations on his baseline liter flow of oxygen are mid 90s which is his baseline.  No significant increase in cough.  No change in sputum production.  No fever reported.  On telephone conversation did not have conversational dyspnea.  Patient requested "something" for COVID.  Prescription of molnupiravir sent to his pharmacy of choice.  Patient advised to go to the ED if his symptoms worsen.  Gailen Shelter, MD Advanced Bronchoscopy PCCM [facility]    *This note was dictated using voice recognition software/Dragon.  Despite best efforts to proofread, errors can occur which can change the meaning.  Any change was purely unintentional.

## 2021-10-31 ENCOUNTER — Emergency Department: Payer: Medicare Other

## 2021-10-31 ENCOUNTER — Inpatient Hospital Stay
Admission: EM | Admit: 2021-10-31 | Discharge: 2021-11-06 | DRG: 177 | Disposition: A | Discharge status: Home / Self Care | Payer: Medicare Other | Attending: Internal Medicine | Admitting: Internal Medicine

## 2021-10-31 ENCOUNTER — Other Ambulatory Visit: Payer: Self-pay

## 2021-10-31 ENCOUNTER — Telehealth: Payer: Self-pay | Admitting: Pulmonary Disease

## 2021-10-31 ENCOUNTER — Inpatient Hospital Stay: Payer: Medicare Other

## 2021-10-31 DIAGNOSIS — F064 Anxiety disorder due to known physiological condition: Secondary | ICD-10-CM | POA: Diagnosis present

## 2021-10-31 DIAGNOSIS — Z951 Presence of aortocoronary bypass graft: Secondary | ICD-10-CM

## 2021-10-31 DIAGNOSIS — Z807 Family history of other malignant neoplasms of lymphoid, hematopoietic and related tissues: Secondary | ICD-10-CM

## 2021-10-31 DIAGNOSIS — Z79899 Other long term (current) drug therapy: Secondary | ICD-10-CM

## 2021-10-31 DIAGNOSIS — I130 Hypertensive heart and chronic kidney disease with heart failure and stage 1 through stage 4 chronic kidney disease, or unspecified chronic kidney disease: Secondary | ICD-10-CM | POA: Diagnosis present

## 2021-10-31 DIAGNOSIS — T380X5A Adverse effect of glucocorticoids and synthetic analogues, initial encounter: Secondary | ICD-10-CM | POA: Diagnosis present

## 2021-10-31 DIAGNOSIS — Z955 Presence of coronary angioplasty implant and graft: Secondary | ICD-10-CM

## 2021-10-31 DIAGNOSIS — E1165 Type 2 diabetes mellitus with hyperglycemia: Secondary | ICD-10-CM | POA: Diagnosis present

## 2021-10-31 DIAGNOSIS — U071 COVID-19: Secondary | ICD-10-CM | POA: Diagnosis present

## 2021-10-31 DIAGNOSIS — J440 Chronic obstructive pulmonary disease with acute lower respiratory infection: Secondary | ICD-10-CM | POA: Diagnosis present

## 2021-10-31 DIAGNOSIS — I6523 Occlusion and stenosis of bilateral carotid arteries: Secondary | ICD-10-CM | POA: Diagnosis present

## 2021-10-31 DIAGNOSIS — Z66 Do not resuscitate: Secondary | ICD-10-CM | POA: Diagnosis present

## 2021-10-31 DIAGNOSIS — I509 Heart failure, unspecified: Secondary | ICD-10-CM | POA: Diagnosis present

## 2021-10-31 DIAGNOSIS — E1122 Type 2 diabetes mellitus with diabetic chronic kidney disease: Secondary | ICD-10-CM | POA: Diagnosis present

## 2021-10-31 DIAGNOSIS — Z7984 Long term (current) use of oral hypoglycemic drugs: Secondary | ICD-10-CM

## 2021-10-31 DIAGNOSIS — R0902 Hypoxemia: Secondary | ICD-10-CM

## 2021-10-31 DIAGNOSIS — Z7951 Long term (current) use of inhaled steroids: Secondary | ICD-10-CM

## 2021-10-31 DIAGNOSIS — Z888 Allergy status to other drugs, medicaments and biological substances status: Secondary | ICD-10-CM

## 2021-10-31 DIAGNOSIS — I2510 Atherosclerotic heart disease of native coronary artery without angina pectoris: Secondary | ICD-10-CM | POA: Diagnosis present

## 2021-10-31 DIAGNOSIS — R0602 Shortness of breath: Secondary | ICD-10-CM | POA: Diagnosis present

## 2021-10-31 DIAGNOSIS — Z8249 Family history of ischemic heart disease and other diseases of the circulatory system: Secondary | ICD-10-CM

## 2021-10-31 DIAGNOSIS — Z9981 Dependence on supplemental oxygen: Secondary | ICD-10-CM

## 2021-10-31 DIAGNOSIS — G8929 Other chronic pain: Secondary | ICD-10-CM | POA: Diagnosis present

## 2021-10-31 DIAGNOSIS — J9621 Acute and chronic respiratory failure with hypoxia: Secondary | ICD-10-CM | POA: Diagnosis present

## 2021-10-31 DIAGNOSIS — L8990 Pressure ulcer of unspecified site, unspecified stage: Secondary | ICD-10-CM | POA: Insufficient documentation

## 2021-10-31 DIAGNOSIS — E875 Hyperkalemia: Secondary | ICD-10-CM | POA: Diagnosis present

## 2021-10-31 DIAGNOSIS — R630 Anorexia: Secondary | ICD-10-CM | POA: Diagnosis present

## 2021-10-31 DIAGNOSIS — E7800 Pure hypercholesterolemia, unspecified: Secondary | ICD-10-CM | POA: Diagnosis present

## 2021-10-31 DIAGNOSIS — N1831 Chronic kidney disease, stage 3a: Secondary | ICD-10-CM | POA: Diagnosis present

## 2021-10-31 DIAGNOSIS — N170 Acute kidney failure with tubular necrosis: Secondary | ICD-10-CM | POA: Diagnosis present

## 2021-10-31 DIAGNOSIS — Z953 Presence of xenogenic heart valve: Secondary | ICD-10-CM

## 2021-10-31 DIAGNOSIS — J441 Chronic obstructive pulmonary disease with (acute) exacerbation: Secondary | ICD-10-CM

## 2021-10-31 DIAGNOSIS — J9602 Acute respiratory failure with hypercapnia: Secondary | ICD-10-CM

## 2021-10-31 DIAGNOSIS — E8721 Acute metabolic acidosis: Secondary | ICD-10-CM | POA: Diagnosis not present

## 2021-10-31 DIAGNOSIS — I252 Old myocardial infarction: Secondary | ICD-10-CM | POA: Diagnosis not present

## 2021-10-31 DIAGNOSIS — I350 Nonrheumatic aortic (valve) stenosis: Secondary | ICD-10-CM | POA: Diagnosis present

## 2021-10-31 DIAGNOSIS — Z7982 Long term (current) use of aspirin: Secondary | ICD-10-CM

## 2021-10-31 DIAGNOSIS — I248 Other forms of acute ischemic heart disease: Secondary | ICD-10-CM | POA: Diagnosis present

## 2021-10-31 DIAGNOSIS — R0609 Other forms of dyspnea: Secondary | ICD-10-CM | POA: Diagnosis not present

## 2021-10-31 DIAGNOSIS — Z8711 Personal history of peptic ulcer disease: Secondary | ICD-10-CM

## 2021-10-31 DIAGNOSIS — J9600 Acute respiratory failure, unspecified whether with hypoxia or hypercapnia: Secondary | ICD-10-CM

## 2021-10-31 DIAGNOSIS — J9622 Acute and chronic respiratory failure with hypercapnia: Secondary | ICD-10-CM | POA: Diagnosis present

## 2021-10-31 DIAGNOSIS — N179 Acute kidney failure, unspecified: Secondary | ICD-10-CM

## 2021-10-31 DIAGNOSIS — I700 Atherosclerosis of aorta: Secondary | ICD-10-CM | POA: Diagnosis present

## 2021-10-31 DIAGNOSIS — Z87891 Personal history of nicotine dependence: Secondary | ICD-10-CM

## 2021-10-31 DIAGNOSIS — I255 Ischemic cardiomyopathy: Secondary | ICD-10-CM | POA: Diagnosis present

## 2021-10-31 LAB — CBC WITH DIFFERENTIAL/PLATELET
Abs Immature Granulocytes: 0.03 10*3/uL (ref 0.00–0.07)
Basophils Absolute: 0.1 10*3/uL (ref 0.0–0.1)
Basophils Relative: 1 %
Eosinophils Absolute: 0.2 10*3/uL (ref 0.0–0.5)
Eosinophils Relative: 2 %
HCT: 51.8 % (ref 39.0–52.0)
Hemoglobin: 17.4 g/dL — ABNORMAL HIGH (ref 13.0–17.0)
Immature Granulocytes: 0 %
Lymphocytes Relative: 22 %
Lymphs Abs: 2.1 10*3/uL (ref 0.7–4.0)
MCH: 30.9 pg (ref 26.0–34.0)
MCHC: 33.6 g/dL (ref 30.0–36.0)
MCV: 91.8 fL (ref 80.0–100.0)
Monocytes Absolute: 0.6 10*3/uL (ref 0.1–1.0)
Monocytes Relative: 6 %
Neutro Abs: 6.6 10*3/uL (ref 1.7–7.7)
Neutrophils Relative %: 69 %
Platelets: 235 10*3/uL (ref 150–400)
RBC: 5.64 MIL/uL (ref 4.22–5.81)
RDW: 14 % (ref 11.5–15.5)
WBC: 9.6 10*3/uL (ref 4.0–10.5)
nRBC: 0 % (ref 0.0–0.2)

## 2021-10-31 LAB — COMPREHENSIVE METABOLIC PANEL
ALT: 58 U/L — ABNORMAL HIGH (ref 0–44)
AST: 26 U/L (ref 15–41)
Albumin: 3.9 g/dL (ref 3.5–5.0)
Alkaline Phosphatase: 112 U/L (ref 38–126)
Anion gap: 12 (ref 5–15)
BUN: 70 mg/dL — ABNORMAL HIGH (ref 8–23)
CO2: 15 mmol/L — ABNORMAL LOW (ref 22–32)
Calcium: 8.8 mg/dL — ABNORMAL LOW (ref 8.9–10.3)
Chloride: 105 mmol/L (ref 98–111)
Creatinine, Ser: 2.5 mg/dL — ABNORMAL HIGH (ref 0.61–1.24)
GFR, Estimated: 26 mL/min — ABNORMAL LOW (ref >60)
Glucose, Bld: 163 mg/dL — ABNORMAL HIGH (ref 70–99)
Potassium: 7.2 mmol/L (ref 3.5–5.1)
Sodium: 132 mmol/L — ABNORMAL LOW (ref 135–145)
Total Bilirubin: 1 mg/dL (ref 0.3–1.2)
Total Protein: 7.1 g/dL (ref 6.5–8.1)

## 2021-10-31 LAB — BRAIN NATRIURETIC PEPTIDE: B Natriuretic Peptide: 768.5 pg/mL — ABNORMAL HIGH (ref 0.0–100.0)

## 2021-10-31 LAB — PROTIME-INR
INR: 1.2 (ref 0.8–1.2)
Prothrombin Time: 14.8 seconds (ref 11.4–15.2)

## 2021-10-31 LAB — TROPONIN I (HIGH SENSITIVITY): Troponin I (High Sensitivity): 180 ng/L (ref <18)

## 2021-10-31 LAB — APTT: aPTT: 42 seconds — ABNORMAL HIGH (ref 24–36)

## 2021-10-31 MED ORDER — METOPROLOL SUCCINATE ER 50 MG PO TB24
50.0000 mg | ORAL_TABLET | Freq: Every day | ORAL | Status: DC
  Administered 2021-11-01 – 2021-11-06 (×6): 50 mg via ORAL
  Filled 2021-10-31 (×6): qty 1

## 2021-10-31 MED ORDER — PREDNISONE 50 MG PO TABS
50.0000 mg | ORAL_TABLET | Freq: Every day | ORAL | Status: DC
  Administered 2021-11-04 – 2021-11-05 (×2): 50 mg via ORAL
  Filled 2021-10-31 (×2): qty 1

## 2021-10-31 MED ORDER — INSULIN ASPART 100 UNIT/ML IJ SOLN
0.0000 [IU] | Freq: Every day | INTRAMUSCULAR | Status: DC
  Administered 2021-11-01: 4 [IU] via SUBCUTANEOUS
  Administered 2021-11-01: 2 [IU] via SUBCUTANEOUS
  Administered 2021-11-02 – 2021-11-04 (×2): 3 [IU] via SUBCUTANEOUS
  Administered 2021-11-05: 21:00:00 2 [IU] via SUBCUTANEOUS
  Filled 2021-10-31 (×5): qty 1

## 2021-10-31 MED ORDER — POLYETHYLENE GLYCOL 3350 17 G PO PACK: 17.0000 g | PACK | Freq: Every day | ORAL | Status: DC | PRN

## 2021-10-31 MED ORDER — SODIUM CHLORIDE 0.45 % IV SOLN
INTRAVENOUS | Status: DC
  Filled 2021-10-31 (×4): qty 75

## 2021-10-31 MED ORDER — ARFORMOTEROL TARTRATE 15 MCG/2ML IN NEBU
15.0000 ug | INHALATION_SOLUTION | Freq: Two times a day (BID) | RESPIRATORY_TRACT | Status: DC
  Filled 2021-10-31 (×2): qty 2

## 2021-10-31 MED ORDER — DOXYCYCLINE HYCLATE 100 MG PO TABS
100.0000 mg | ORAL_TABLET | Freq: Two times a day (BID) | ORAL | Status: AC
  Administered 2021-10-31 – 2021-11-05 (×10): 100 mg via ORAL
  Filled 2021-10-31 (×10): qty 1

## 2021-10-31 MED ORDER — ASPIRIN EC 81 MG PO TBEC: 81.0000 mg | DELAYED_RELEASE_TABLET | Freq: Every day | ORAL | Status: DC

## 2021-10-31 MED ORDER — CALCIUM GLUCONATE 10 % IV SOLN
1.0000 g | Freq: Once | INTRAVENOUS | Status: AC
  Administered 2021-10-31: 1 g via INTRAVENOUS
  Filled 2021-10-31: qty 10

## 2021-10-31 MED ORDER — SODIUM CHLORIDE 0.9 % IV BOLUS
1000.0000 mL | Freq: Once | INTRAVENOUS | Status: AC
  Administered 2021-10-31: 1000 mL via INTRAVENOUS

## 2021-10-31 MED ORDER — REVEFENACIN 175 MCG/3ML IN SOLN
175.0000 ug | Freq: Every day | RESPIRATORY_TRACT | Status: DC
  Filled 2021-10-31: qty 3

## 2021-10-31 MED ORDER — LORAZEPAM 2 MG/ML IJ SOLN: 0.5000 mg | Freq: Once | INTRAMUSCULAR | Status: DC

## 2021-10-31 MED ORDER — GUAIFENESIN ER 600 MG PO TB12
1200.0000 mg | ORAL_TABLET | Freq: Two times a day (BID) | ORAL | Status: DC | PRN
  Administered 2021-11-01 – 2021-11-05 (×2): 1200 mg via ORAL
  Filled 2021-10-31 (×2): qty 2

## 2021-10-31 MED ORDER — DEXTROSE 50 % IV SOLN
1.0000 | Freq: Once | INTRAVENOUS | Status: AC
  Administered 2021-10-31: 50 mL via INTRAVENOUS
  Filled 2021-10-31: qty 50

## 2021-10-31 MED ORDER — TRAZODONE HCL 50 MG PO TABS
50.0000 mg | ORAL_TABLET | Freq: Every evening | ORAL | Status: DC | PRN
  Administered 2021-10-31 – 2021-11-05 (×4): 50 mg via ORAL
  Filled 2021-10-31 (×4): qty 1

## 2021-10-31 MED ORDER — SODIUM BICARBONATE 8.4 % IV SOLN
50.0000 meq | Freq: Once | INTRAVENOUS | Status: AC
  Administered 2021-10-31: 50 meq via INTRAVENOUS
  Filled 2021-10-31: qty 50

## 2021-10-31 MED ORDER — SODIUM ZIRCONIUM CYCLOSILICATE 5 G PO PACK
10.0000 g | PACK | Freq: Two times a day (BID) | ORAL | Status: AC
  Administered 2021-11-01 (×3): 10 g via ORAL
  Filled 2021-10-31 (×2): qty 2
  Filled 2021-10-31 (×2): qty 1
  Filled 2021-10-31: qty 2

## 2021-10-31 MED ORDER — HYDROCOD POLST-CPM POLST ER 10-8 MG/5ML PO SUER
5.0000 mL | Freq: Two times a day (BID) | ORAL | Status: DC | PRN
  Administered 2021-11-01 – 2021-11-05 (×5): 5 mL via ORAL
  Filled 2021-10-31 (×5): qty 5

## 2021-10-31 MED ORDER — ALPRAZOLAM 0.5 MG PO TABS
0.5000 mg | ORAL_TABLET | Freq: Two times a day (BID) | ORAL | Status: DC
  Administered 2021-10-31 – 2021-11-06 (×12): 0.5 mg via ORAL
  Filled 2021-10-31 (×13): qty 1

## 2021-10-31 MED ORDER — BUSPIRONE HCL 10 MG PO TABS
10.0000 mg | ORAL_TABLET | Freq: Two times a day (BID) | ORAL | Status: DC
  Administered 2021-10-31 – 2021-11-05 (×11): 10 mg via ORAL
  Filled 2021-10-31: qty 2
  Filled 2021-10-31 (×13): qty 1

## 2021-10-31 MED ORDER — BUDESONIDE 0.5 MG/2ML IN SUSP
0.5000 mg | Freq: Two times a day (BID) | RESPIRATORY_TRACT | Status: DC
  Filled 2021-10-31: qty 2

## 2021-10-31 MED ORDER — ALBUTEROL SULFATE (2.5 MG/3ML) 0.083% IN NEBU
2.5000 mg | INHALATION_SOLUTION | Freq: Four times a day (QID) | RESPIRATORY_TRACT | Status: DC | PRN
  Administered 2021-11-03: 2.5 mg via RESPIRATORY_TRACT
  Filled 2021-10-31: qty 3

## 2021-10-31 MED ORDER — HEPARIN (PORCINE) 25000 UT/250ML-% IV SOLN
1300.0000 [IU]/h | INTRAVENOUS | Status: DC
  Administered 2021-10-31 – 2021-11-02 (×3): 1300 [IU]/h via INTRAVENOUS
  Filled 2021-10-31 (×4): qty 250

## 2021-10-31 MED ORDER — INSULIN ASPART 100 UNIT/ML IJ SOLN
0.0000 [IU] | Freq: Three times a day (TID) | INTRAMUSCULAR | Status: DC
Start: 2021-11-01 — End: 2021-11-06
  Administered 2021-11-01: 3 [IU] via SUBCUTANEOUS
  Administered 2021-11-01 – 2021-11-02 (×4): 5 [IU] via SUBCUTANEOUS
  Administered 2021-11-02: 19:00:00 7 [IU] via SUBCUTANEOUS
  Administered 2021-11-03 (×2): 3 [IU] via SUBCUTANEOUS
  Administered 2021-11-03: 5 [IU] via SUBCUTANEOUS
  Administered 2021-11-04: 3 [IU] via SUBCUTANEOUS
  Administered 2021-11-04: 2 [IU] via SUBCUTANEOUS
  Administered 2021-11-04: 14:00:00 3 [IU] via SUBCUTANEOUS
  Administered 2021-11-05: 09:00:00 1 [IU] via SUBCUTANEOUS
  Administered 2021-11-05: 7 [IU] via SUBCUTANEOUS
  Administered 2021-11-05: 2 [IU] via SUBCUTANEOUS
  Filled 2021-10-31 (×15): qty 1

## 2021-10-31 MED ORDER — HEPARIN BOLUS VIA INFUSION
4500.0000 [IU] | Freq: Once | INTRAVENOUS | Status: AC
  Administered 2021-10-31: 4500 [IU] via INTRAVENOUS
  Filled 2021-10-31: qty 4500

## 2021-10-31 MED ORDER — ALBUTEROL SULFATE HFA 108 (90 BASE) MCG/ACT IN AERS
2.0000 | INHALATION_SPRAY | Freq: Once | RESPIRATORY_TRACT | Status: AC
  Administered 2021-10-31: 2 via RESPIRATORY_TRACT
  Filled 2021-10-31: qty 6.7

## 2021-10-31 MED ORDER — PANTOPRAZOLE SODIUM 40 MG PO TBEC
40.0000 mg | DELAYED_RELEASE_TABLET | Freq: Every day | ORAL | Status: DC
  Administered 2021-11-01 – 2021-11-05 (×5): 40 mg via ORAL
  Filled 2021-10-31 (×6): qty 1

## 2021-10-31 MED ORDER — DOCUSATE SODIUM 100 MG PO CAPS: 100.0000 mg | ORAL_CAPSULE | Freq: Two times a day (BID) | ORAL | Status: DC | PRN

## 2021-10-31 MED ORDER — INSULIN ASPART 100 UNIT/ML IV SOLN
10.0000 [IU] | Freq: Once | INTRAVENOUS | Status: AC
  Administered 2021-10-31: 10 [IU] via INTRAVENOUS
  Filled 2021-10-31: qty 0.1

## 2021-10-31 MED ORDER — METHYLPREDNISOLONE SODIUM SUCC 125 MG IJ SOLR
125.0000 mg | Freq: Once | INTRAMUSCULAR | Status: AC
  Administered 2021-10-31: 125 mg via INTRAVENOUS
  Filled 2021-10-31: qty 2

## 2021-10-31 MED ORDER — METHYLPREDNISOLONE SODIUM SUCC 40 MG IJ SOLR
0.5000 mg/kg | Freq: Two times a day (BID) | INTRAMUSCULAR | Status: AC
  Administered 2021-11-01 – 2021-11-03 (×6): 37.6 mg via INTRAVENOUS
  Filled 2021-10-31 (×6): qty 1

## 2021-10-31 MED ORDER — GUAIFENESIN-DM 100-10 MG/5ML PO SYRP
10.0000 mL | ORAL_SOLUTION | ORAL | Status: DC | PRN
  Administered 2021-11-03 – 2021-11-04 (×3): 10 mL via ORAL
  Filled 2021-10-31 (×3): qty 10

## 2021-10-31 MED ORDER — SODIUM CHLORIDE 0.45 % IV SOLN
INTRAVENOUS | Status: AC
  Filled 2021-10-31 (×7): qty 75

## 2021-10-31 MED ORDER — INSULIN ASPART 100 UNIT/ML IJ SOLN
INTRAMUSCULAR | Status: AC
  Filled 2021-10-31: qty 1

## 2021-10-31 MED ORDER — FOLIC ACID 1 MG PO TABS
500.0000 ug | ORAL_TABLET | Freq: Every evening | ORAL | Status: DC
  Administered 2021-11-01 – 2021-11-05 (×5): 0.5 mg via ORAL
  Filled 2021-10-31 (×5): qty 1

## 2021-10-31 MED ORDER — ASPIRIN EC 81 MG PO TBEC
81.0000 mg | DELAYED_RELEASE_TABLET | Freq: Every day | ORAL | Status: DC
  Administered 2021-11-01 – 2021-11-06 (×6): 81 mg via ORAL
  Filled 2021-10-31 (×6): qty 1

## 2021-10-31 MED ORDER — ONDANSETRON HCL 4 MG/2ML IJ SOLN: 4.0000 mg | Freq: Four times a day (QID) | INTRAMUSCULAR | Status: DC | PRN

## 2021-10-31 NOTE — Telephone Encounter (Signed)
Spoke with the pt  He is currently on molnupiravir for covid 19- started 10/29/21  States having worsening SOB and sats in the 80's on 8lpm  Urged him to go to the ED for eval and he agreed  Sending to Dr Jayme Cloud as Lorain Childes

## 2021-10-31 NOTE — Progress Notes (Signed)
[facility] PCCM  Came back to discuss code status with patient  He reports that he does not want CPR or a ventilator in the event of a cardiac arrest He and Dr. Pete Glatter have covered this in previous office visits DNR order written  Harold Rifle, MD  PCCM Pager: [phone] Cell: [phone] After 7:00 pm call Elink  [phone]

## 2021-10-31 NOTE — ED Provider Notes (Signed)
[facility] Emergency Department Provider Note   ____________________________________________   Event Date/Time   First MD Initiated Contact with Patient 10/31/21 1425     (approximate)  I have reviewed the triage vital signs and the nursing notes.   HISTORY  Chief Complaint Shortness of Breath    HPI Harold Parker is a 75 y.o. male with past medical history of hypertension, hyperlipidemia, CAD, CHF, diabetes, and COPD on 4 L who presents to the ED complaining of shortness of breath.  Patient reports that he initially developed cough, body aches, and difficulty breathing 3 days ago after returning from a trip to [place].  He subsequently took 2 home tests for COVID-19 that came back +2 days ago.  He spoke with his pulmonologist over the phone, who provided a prescription for molnupiravir.  He has been taking this without significant relief in symptoms, states that his difficulty breathing has been worsening over the past 2 days.  He denies any fevers, endorses a productive cough but denies any pain in his chest.  He has felt nauseous but has not vomited, denies any abdominal pain or diarrhea.  He has not noticed any pain or swelling in his legs.        Past Medical History:  Diagnosis Date   Bilateral carotid bruits    a. 01/2018 U/S: < 50% bilat ICA stenosis.   CAD (coronary artery disease)    a. 1998 s/p MI and BMS [place], [place]); b. 1999 redo PCI/rotablator in setting of what sounds like ISR;  c. Multiple stress tests over the years - last ~ 2017, reportedly nl; d. 01/2018 NSTEMI/Cath: LM 51m/d, LAD 50p, 40p/m, D1 60ost, OM1 95, RCA 100ost/p w/ L->R collats, EF 45%; e. s/p 3V CABG 02/19/18 (LIMA-LAD, VG-D1, VG-OM)   Chronic lower back pain    COPD (chronic obstructive pulmonary disease) (HCC)    GIB (gastrointestinal bleeding)    a. 02/2018 GIB and anemia w/ Hgb of 4.7 on presentation; b. 03/2018 EGD: 2 nonbleeding duodenal ulcers.   HTN (hypertension)     Hypercholesteremia    Ischemic cardiomyopathy    a. 01/2018 Echo: EF 40-45%, mid-apicalanteroseptal, ant, apical sev HK, mod apicalinferior HK. Gr2 DD. Mod AS, mild MR, mod dil LA, PASP 52mmHg.   Moderate aortic stenosis    a. 01/2018 Echo: Mod AS, mean grad (S) 63mmHg, Valve area (VTI) 1.06 cm^2, (Vmax) 1.27 cm^2; b. 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   LIMA to LAD, SVG to D1, SVG to OM, EVH via right thigh and leg   Tobacco abuse    Type II diabetes mellitus ([place])     Patient Active Problem List   Diagnosis Date Noted   Chronic respiratory failure with hypoxia ([place]) 05/02/2021   Insomnia    History of COVID-19 pneumonia 01/11/21 02/04/2021   Pulmonary nodules    Acute respiratory disease due to COVID-19 virus 01/11/2021   HTN (hypertension)    Chronic systolic CHF (congestive heart failure) (HCC)    COPD (chronic obstructive pulmonary disease) (HCC)    GERD (gastroesophageal reflux disease)    Anxiety    AKI (acute kidney injury) ([place])    Hyperkalemia    Elevated troponin    Acute hypoxemic respiratory failure due to COVID-19 ([place])  Melena    Duodenal ulceration    Gastrointestinal hemorrhage    Severe anemia 03/14/2018   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   S/P aortic valve replacement with bioprosthetic valve  02/19/2018   Preoperative respiratory examination 02/13/2018   Acute on chronic respiratory failure with hypoxia (HCC) 02/11/2018   Influenza with respiratory manifestation 02/11/2018   Stage 3 severe COPD by GOLD classification ([place])    Aortic stenosis    Bronchitis, chronic obstructive, with exacerbation (HCC)    Tobacco abuse    CAD (coronary artery disease) [id number]   Acute systolic CHF (congestive heart failure) ([place]) 02/07/2018   Ischemic  cardiomyopathy 02/07/2018   Type 2 diabetes mellitus with hyperglycemia, without long-term current use of insulin ([place]) 02/07/2018   Hyperlipidemia LDL goal <70 02/07/2018   COPD with acute exacerbation ([place]) 02/05/2018   NSTEMI (non-ST elevated myocardial infarction) ([place]) 02/05/2018    Past Surgical History:  Procedure Laterality Date   ABDOMINAL AORTOGRAM N/A 03/04/2019   Procedure: ABDOMINAL AORTOGRAM;  Surgeon: Wellington Hampshire, MD;  Location: [place] [facility];  Service: Cardiovascular;  Laterality: N/A;   AORTIC VALVE REPLACEMENT N/A 02/19/2018   Procedure: AORTIC VALVE REPLACEMENT (AVR);  Surgeon: Rexene Alberts, MD;  Location: [place];  Service: Open Heart Surgery;  Laterality: N/A;   COLONOSCOPY     CORONARY ANGIOPLASTY WITH STENT PLACEMENT  ~ 1998/1999   CORONARY ARTERY BYPASS GRAFT N/A 02/19/2018   Procedure: CORONARY ARTERY BYPASS GRAFTING (CABG) x three , using left internal mammary artery and right leg greater saphenous vein harvested endoscopically;  Surgeon: Rexene Alberts, MD;  Location: [place];  Service: Open Heart Surgery;  Laterality: N/A;   ESOPHAGOGASTRODUODENOSCOPY (EGD) WITH PROPOFOL N/A 03/18/2018   Procedure: ESOPHAGOGASTRODUODENOSCOPY (EGD) WITH PROPOFOL;  Surgeon: Lucilla Lame, MD;  Location: [facility];  Service: Endoscopy;  Laterality: N/A;   LEFT HEART CATH AND CORONARY ANGIOGRAPHY N/A 02/06/2018   Procedure: LEFT HEART CATH AND CORONARY ANGIOGRAPHY;  Surgeon: Wellington Hampshire, MD;  Location: [place] [facility];  Service: Cardiovascular;  Laterality: N/A;   LOWER EXTREMITY ANGIOGRAPHY Bilateral 03/04/2019   Procedure: Lower Extremity Angiography;  Surgeon: Wellington Hampshire, MD;  Location: [place] [facility];  Service: Cardiovascular;  Laterality: Bilateral;   PERIPHERAL VASCULAR ATHERECTOMY Left 03/04/2019   Procedure: PERIPHERAL VASCULAR ATHERECTOMY;  Surgeon: Wellington Hampshire, MD;  Location: [place] [facility];  Service: Cardiovascular;  Laterality: Left;   SFA   TEE WITHOUT CARDIOVERSION N/A 02/19/2018   Procedure: TRANSESOPHAGEAL ECHOCARDIOGRAM (TEE);  Surgeon: Rexene Alberts, MD;  Location: [place];  Service: Open Heart Surgery;  Laterality: N/A;   TONSILLECTOMY      Prior to Admission medications   Medication Sig Start Date End Date Taking? Authorizing Provider  acetaminophen (TYLENOL) 500 MG tablet Take 1,000 mg by mouth daily as needed for moderate pain or headache.    [provider]  albuterol (PROVENTIL) (2.5 MG/3ML) 0.083% nebulizer solution Take 3 mLs (2.5 mg total) by nebulization every 6 (six) hours as needed for wheezing or shortness of breath. 03/01/21   Tyler Pita, MD  ALPRAZolam Duanne Moron) 0.5 MG tablet Take 0.5 mg by mouth 2 (two) times daily.    [provider]  aspirin EC 81 MG tablet Take 81 mg by mouth daily.    [provider]  budesonide (PULMICORT) 0.5 MG/2ML nebulizer solution Take 2 mLs (0.5 mg total) by nebulization 2 (two) times daily. Use AFTER Perforomist 03/01/21   Vernard Gambles  L, MD  busPIRone (BUSPAR) 10 MG tablet Take 10 mg by mouth 2 (two) times daily. 04/15/21   [provider]  esomeprazole (NEXIUM) 40 MG capsule Take 40 mg by mouth at bedtime.     [provider]  ezetimibe (ZETIA) 10 MG tablet Take 10 mg by mouth at bedtime.     [provider]  folic acid (FOLVITE) [id number] MCG tablet Take 400 mcg by mouth every evening.    [provider]  formoterol (PERFOROMIST) 20 MCG/2ML nebulizer solution Take 2 mLs (20 mcg total) by nebulization 2 (two) times daily. 03/01/21   Tyler Pita, MD  furosemide (LASIX) 40 MG tablet Take 40 mg by mouth 2 (two) times daily.    [provider]  glipiZIDE (GLUCOTROL) 5 MG tablet Take 0.5 tablets (2.5 mg total) by mouth daily before breakfast. Patient taking differently: Take 5 mg by mouth See admin instructions. Take 5 mg in the morning and take a second 5 mg dose at night on Mon, Wed, and Fri 02/27/18    Conte, Tessa N, PA-C  guaiFENesin (MUCINEX) 600 MG 12 hr tablet Take 1,200 mg by mouth every 12 (twelve) hours as needed. 01/23/21   [provider]  losartan (COZAAR) 100 MG tablet Take 100 mg by mouth daily. 01/29/21   [provider]  metFORMIN (GLUCOPHAGE) 1000 MG tablet TAKE 1 TABLET BY MOUTH TWICE DAILY WITH A MEAL 04/27/18   [place]cott Gum, Collier [place], MD  metoprolol succinate (TOPROL-XL) 50 MG 24 hr tablet Take 1 tablet (50 mg total) by mouth daily. 05/26/21   Wellington Hampshire, MD  molnupiravir EUA (LAGEVRIO) 200 MG CAPS capsule Take 4 capsules (800 mg total) by mouth 2 (two) times daily for 5 days. 10/29/21 11/03/21  Tyler Pita, MD  Nebulizers (COMPRESSOR/NEBULIZER) MISC Use as directed 10/26/21   Tyler Pita, MD  [facility] HFA 108 [phone] Base) MCG/ACT inhaler Inhale 1 puff into the lungs every 4 (four) hours as needed for wheezing or shortness of breath. 02/04/19   [provider]  revefenacin (YUPELRI) 175 MCG/3ML nebulizer solution Take 3 mLs (175 mcg total) by nebulization daily. Can be mixed with Perforomist 03/01/21   Tyler Pita, MD  traZODone (DESYREL) 50 MG tablet Take 50 mg by mouth at bedtime as needed. 02/09/21   [provider]  Vitamin D, Cholecalciferol, 1000 units TABS Take 1,000 Units by mouth every morning.     [provider]    Allergies Atorvastatin  Family History  Problem Relation Age of Onset   Lymphoma Mother    Peripheral vascular disease Father     Social History Social History   Tobacco Use   Smoking status: Former    Packs/day: 1.00    Years: 40.00    Pack years: 40.00    Types: Cigarettes    Quit date: 02/02/2018    Years since quitting: 3.7   Smokeless tobacco: Never   Tobacco comments:    Quit 01/2018 - on 05/01/2018 talked to Merari about [place] concerns. He ststaes that he has no taste for tobacco since he quit in Feb.  Vaping Use   Vaping Use: Never used  Substance Use Topics   Alcohol use: Yes     Comment: occ   Drug use: No    Review of Systems  Constitutional: No fever/chills.  Positive for generalized weakness and malaise. Eyes: No visual changes. ENT: No sore throat. Cardiovascular: Denies chest pain. Respiratory: Positive for cough and shortness  of breath. Gastrointestinal: No abdominal pain.  Positive for nausea, no vomiting.  No diarrhea.  No constipation. Genitourinary: Negative for dysuria. Musculoskeletal: Negative for back pain. Skin: Negative for rash. Neurological: Negative for headaches, focal weakness or numbness.  ____________________________________________   PHYSICAL EXAM:  VITAL SIGNS: ED Triage Vitals  Enc Vitals Group     BP 10/31/21 1329 (!) 143/115     Pulse Rate 10/31/21 1329 60     Resp 10/31/21 1329 (!) 26     Temp 10/31/21 1331 97.6 F (36.4 C)     Temp Source 10/31/21 1331 Oral     SpO2 10/31/21 1329 97 %     Weight 10/31/21 1331 165 lb (74.8 kg)     Height 10/31/21 1331 5\' 5"  (1.651 m)     Head Circumference --      Peak Flow --      Pain Score 10/31/21 1330 4     Pain Loc --      Pain Edu? --      Excl. in [place]? --     Constitutional: Alert and oriented. Eyes: Conjunctivae are normal. Head: Atraumatic. Nose: No congestion/rhinnorhea. Mouth/Throat: Mucous membranes are moist. Neck: Normal ROM Cardiovascular: Normal rate, regular rhythm. Grossly normal heart sounds.  2+ radial pulses bilaterally. Respiratory: Tachypneic with moderately increased respiratory effort.  No retractions. Lungs CTAB. Gastrointestinal: Soft and nontender. No distention. Genitourinary: deferred Musculoskeletal: No lower extremity tenderness nor edema. Neurologic:  Normal speech and language. No gross focal neurologic deficits are appreciated. Skin:  Skin is warm, dry and intact. No rash noted. Psychiatric: Mood and affect are normal. Speech and behavior are normal.  ____________________________________________   LABS (all labs ordered are listed,  but only abnormal results are displayed)  Labs Reviewed  RESP PANEL BY RT-PCR (FLU A&B, COVID) ARPGX2 - Abnormal; Notable for the following components:      Result Value   SARS Coronavirus 2 by RT PCR POSITIVE (*)    All other components within normal limits  CBC WITH DIFFERENTIAL/PLATELET - Abnormal; Notable for the following components:   Hemoglobin 17.4 (*)    All other components within normal limits  COMPREHENSIVE METABOLIC PANEL - Abnormal; Notable for the following components:   Sodium 132 (*)    Potassium 7.2 (*)    CO2 15 (*)    Glucose, Bld 163 (*)    BUN 70 (*)    Creatinine, Ser 2.50 (*)    Calcium 8.8 (*)    ALT 58 (*)    GFR, Estimated 26 (*)    All other components within normal limits  BLOOD GAS, VENOUS - Abnormal; Notable for the following components:   pCO2, Ven 43 (*)    Acid-base deficit 4.6 (*)    All other components within normal limits  TROPONIN I (HIGH SENSITIVITY) - Abnormal; Notable for the following components:   Troponin I (High Sensitivity) 180 (*)    All other components within normal limits  BRAIN NATRIURETIC PEPTIDE  PROCALCITONIN  TROPONIN I (HIGH SENSITIVITY)   ____________________________________________  EKG  ED ECG REPORT I, Blake Divine, the attending physician, personally viewed and interpreted this ECG.   Date: 10/31/2021  EKG Time: 13:26  Rate: 63  Rhythm: normal sinus rhythm  Axis: Normal  Intervals:right bundle branch block  ST&T Change: Inferior T wave inversions    PROCEDURES  Procedure(s) performed (including Critical Care):  .Critical Care Performed by: Blake Divine, MD Authorized by: Blake Divine, MD   Critical care provider statement:  Critical care time (minutes):  45   Critical care time was exclusive of:  Separately billable procedures and treating other patients and teaching time   Critical care was necessary to treat or prevent imminent or life-threatening deterioration of the following  conditions:  Respiratory failure and metabolic crisis   Critical care was time spent personally by me on the following activities:  Development of treatment plan with patient or surrogate, discussions with consultants, evaluation of patient's response to treatment, examination of patient, ordering and review of laboratory studies, ordering and review of radiographic studies, ordering and performing treatments and interventions, pulse oximetry, re-evaluation of patient's condition and review of old charts   I assumed direction of critical care for this patient from another provider in my specialty: no     Care discussed with: admitting provider     ____________________________________________   INITIAL IMPRESSION / ASSESSMENT AND PLAN / ED COURSE      75 year old male with past medical history of hypertension, hyperlipidemia, CAD, CHF, diabetes, COPD on 4 L who presents to the ED with increasing difficulty breathing over the past 2 to 3 days after testing positive for COVID-19 at home 2 days ago.  Patient noted to be tachypneic with increased respiratory effort on arrival but maintaining O2 sats on his usual 4 L nasal cannula.  Patient reports significant dyspnea and given this he was placed on bubbler high flow with improvement in his work of breathing.  Lungs are clear to auscultation bilaterally but given his worsening respiratory difficulties and history of COPD, we will give dose of IV Solu-Medrol and albuterol.  Chest x-ray reviewed by me and does not show any focal infiltrates, edema, or effusion.  We will send procalcitonin but low suspicion for superimposed pneumonia.  EKG shows no evidence of arrhythmia or ischemia, troponin is pending but low suspicion for PE at this time as patient denies chest pain.   Labs remarkable for acute renal failure with creatinine of 2.7 and potassium of 7.2.  Patient has history of right bundle branch block however QRS complexes seem wider than previous on  today's EKG and we will give dose of IV calcium.  In addition, we will treat with insulin and bicarb, case discussed with nephrology who recommends proceeding with bicarb drip in D5 half NS.  Patient remains stable from respiratory perspective on high flow nasal cannula, case discussed with ICU team for admission.      ____________________________________________   FINAL CLINICAL IMPRESSION(S) / ED DIAGNOSES  Final diagnoses:  COVID-19  Acute renal failure, unspecified acute renal failure type (HCC)  Hyperkalemia  Acute respiratory failure, unspecified whether with hypoxia or hypercapnia [facility])     ED Discharge Orders     None        Note:  This document was prepared using Dragon voice recognition software and may include unintentional dictation errors.    Chesley Noon, MD 10/31/21 1730

## 2021-10-31 NOTE — ED Notes (Signed)
Vascular in room at this time  °

## 2021-10-31 NOTE — Progress Notes (Signed)
PHARMACY CONSULT NOTE - FOLLOW UP  Pharmacy Consult for Electrolyte Monitoring and Replacement   Recent Labs: Potassium (mmol/L)  Date Value  10/31/2021 7.2 (HH)   Magnesium (mg/dL)  Date Value  58/52/7782 1.9   Calcium (mg/dL)  Date Value  42/35/3614 8.8 (L)   Albumin (g/dL)  Date Value  43/15/4008 3.9  03/27/2018 3.8   Phosphorus (mg/dL)  Date Value  67/61/9509 4.0   Sodium (mmol/L)  Date Value  10/31/2021 132 (L)  02/04/2019 143   Corr Ca: 8.88 mg/dL  Assessment: 75 y.o. male with past medical history of hypertension, hyperlipidemia, CAD, CHF, diabetes, and COPD on 4 L who presents to the ED complaining of shortness of breath. Pharmacy has been consulted for electrolyte replacement.   Goal of Therapy:  Electrolytes WNL  Plan:  K 7.2 - pt received Ca gluconate, 10 u IV novolog and started on bicarb gtt @100  ml/hr; orders for Lokelma 10 g x 3 doses Recheck electrolytes with AM labs  ,PharmD Clinical Pharmacist 10/31/2021 5:48 PM

## 2021-10-31 NOTE — ED Triage Notes (Signed)
Pt comes into the ED via EMS from home., states he had covid test on Sunday was +, states sx started Saturday, 4L [place] on home O2 at baseline  EMS was unable to get an O2 reading on the pt  Pt is tachynic on arrival, skin is warm and dry.

## 2021-10-31 NOTE — ED Provider Notes (Signed)
Emergency Medicine Provider Triage Evaluation Note  Jaquaveon Bilal , a 75 y.o. male  was evaluated in triage.  Pt complains of shortness of breath and dyspnea on exertion. Positive for COVID 2 days ago. On 4 liters O2 at home. Dyspnea is worse.   Review of Systems  Positive: Dyspnea Negative: Fever  Physical Exam  There were no vitals taken for this visit. Gen:   Awake, no distress   Resp:  Normal effort  MSK:   Moves extremities without difficulty  Other:    Medical Decision Making  Medically screening exam initiated at 1:23 PM.  Appropriate orders placed.  Tigran Haynie was informed that the remainder of the evaluation will be completed by another provider, this initial triage assessment does not replace that evaluation, and the importance of remaining in the ED until their evaluation is complete.   Chinita Pester, FNP 10/31/21 1332    Gilles Chiquito, MD 10/31/21 [phone]

## 2021-10-31 NOTE — Progress Notes (Signed)
ANTICOAGULATION CONSULT NOTE  Pharmacy Consult for heparin infusion Indication: pulmonary embolus  Allergies  Allergen Reactions   Atorvastatin Other (See Comments)    Leg aches and weakness     Patient Measurements: Height: 5\' 5"  (165.1 cm) Weight: 74.8 kg (165 lb) IBW/kg (Calculated) : 61.5 Heparin Dosing Weight: 74.8 kg  Vital Signs: Temp: 97.6 F (36.4 C) (11/15 1331) Temp Source: Oral (11/15 1331) BP: 108/87 (11/15 1331) Pulse Rate: 64 (11/15 1730)  Labs: Recent Labs    10/31/21 1335 10/31/21 1435  HGB 17.4*  --   HCT 51.8  --   PLT 235  --   CREATININE  --  2.50*  TROPONINIHS  --  180*    Estimated Creatinine Clearance: 23.4 mL/min (A) (by C-G formula based on SCr of 2.5 mg/dL (H)).   Medical History: Past Medical History:  Diagnosis Date   Bilateral carotid bruits    a. 01/2018 U/S: < 50% bilat ICA stenosis.   CAD (coronary artery disease)    a. 1998 s/p MI and BMS [place], [place]); b. 1999 redo PCI/rotablator in setting of what sounds like ISR;  c. Multiple stress tests over the years - last ~ 2017, reportedly nl; d. 01/2018 NSTEMI/Cath: LM 48m/d, LAD 50p, 40p/m, D1 60ost, OM1 95, RCA 100ost/p w/ L->R collats, EF 45%; e. s/p 3V CABG 02/19/18 (LIMA-LAD, VG-D1, VG-OM)   Chronic lower back pain    COPD (chronic obstructive pulmonary disease) (HCC)    GIB (gastrointestinal bleeding)    a. 02/2018 GIB and anemia w/ Hgb of 4.7 on presentation; b. 03/2018 EGD: 2 nonbleeding duodenal ulcers.   HTN (hypertension)    Hypercholesteremia    Ischemic cardiomyopathy    a. 01/2018 Echo: EF 40-45%, mid-apicalanteroseptal, ant, apical sev HK, mod apicalinferior HK. Gr2 DD. Mod AS, mild MR, mod dil LA, PASP 02/2018.   Moderate aortic stenosis    a. 01/2018 Echo: Mod AS, mean grad (S) 02/2018, Valve area (VTI) 1.06 cm^2, (Vmax) 1.27 cm^2; b. 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 male with past medical history of hypertension, hyperlipidemia, CAD, CHF, diabetes, and COPD on 4 L who presents to the ED complaining of shortness of breath. Pharmacy has been consulted for heparin dosing/monitoring.   Baseline labs: Hgb 17.4, Hct and Plt WNL; aPTT, PT-INR pending  Goal of Therapy:  Heparin level 0.3-0.7 units/ml Monitor platelets by anticoagulation protocol: Yes   Plan:  Give 4500 units bolus x 1 Start heparin infusion at 1300 units/hr Check anti-Xa level in 8 hours and daily while on heparin Continue to monitor H&H and platelets  Franca Stakes O Virna Livengood 10/31/2021,5:54 PM

## 2021-10-31 NOTE — H&P (Addendum)
NAME:  Harold Parker, MRN:  [id number], DOB:  [birth year], LOS: 0 ADMISSION DATE:  10/31/2021, CONSULTATION DATE:  11/15 REFERRING MD:  Charna Archer, CHIEF COMPLAINT:  Dyspnea   History of Present Illness:  75 year old male with a past medical history significant for COPD and severe COVID-19 pneumonia who has chronic respiratory failure with hypoxemia requiring 4 L of oxygen continuously came to the [facility] on October 31, 2021 with a chief complaint of generalized weakness, shortness of breath, hypoxemia, and a recent diagnosis of COVID-19.  He is followed by my partner Dr. Patsey Berthold for COPD and complications from his's COVID-19 pneumonia which she experienced in early 2022.  He recently traveled to [place] and returned home several days ago and noted on the drive home that he was feeling more short of breath.  He denied any leg pain or swelling.  He did have body aches, some fevers chills and increased cough with occasional production of green mucus.  He performed a home test for COVID-19 which is noted to be positive.  He called Dr. Patsey Berthold who called in a prescription of molnupiravir.  Breo.  He has been taking that at home.  His appetite at home has been very poor.  He has not had much solid food to eat in the last few days but he has been drinking a lot of fluids.  Specifically he says he has been drinking many "body armor" energy drinks which incidentally contain electrolyte solution.  He has also been eating bananas.  He says that he has been urinating fine, denies any sort of hematuria or dysuria.  He has had some mild abdominal pain.  Because of worsening shortness of breath he presented to the [facility] for further evaluation.  Pulmonary and critical care medicine was consulted because of worsening hypoxemic respiratory failure and acute kidney injury with hyperkalemia.  In the emergency room the patient received IV fluids and standard hyperkalemia  treatment.  He denies nausea vomiting or diarrhea.  Pertinent  Medical History   Past Medical History:  Diagnosis Date   Bilateral carotid bruits    a. 01/2018 U/S: < 50% bilat ICA stenosis.   CAD (coronary artery disease)    a. 1998 s/p MI and BMS [place], [place]); b. 1999 redo PCI/rotablator in setting of what sounds like ISR;  c. Multiple stress tests over the years - last ~ 2017, reportedly nl; d. 01/2018 NSTEMI/Cath: LM 14m/d, LAD 50p, 40p/m, D1 60ost, OM1 95, RCA 100ost/p w/ L->R collats, EF 45%; e. s/p 3V CABG 02/19/18 (LIMA-LAD, VG-D1, VG-OM)   Chronic lower back pain    COPD (chronic obstructive pulmonary disease) (HCC)    GIB (gastrointestinal bleeding)    a. 02/2018 GIB and anemia w/ Hgb of 4.7 on presentation; b. 03/2018 EGD: 2 nonbleeding duodenal ulcers.   HTN (hypertension)    Hypercholesteremia    Ischemic cardiomyopathy    a. 01/2018 Echo: EF 40-45%, mid-apicalanteroseptal, ant, apical sev HK, mod apicalinferior HK. Gr2 DD. Mod AS, mild MR, mod dil LA, PASP 76mmHg.   Moderate aortic stenosis    a. 01/2018 Echo: Mod AS, mean grad (S) 26mmHg, Valve area (VTI) 1.06 cm^2, (Vmax) 1.27 cm^2; b. 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 rate (!) 26, height 5\' 5"  (1.651 m), weight 74.8 kg, SpO2 97 %.       No intake or output data in the 24 hours ending 10/31/21  1710 Filed Weights   10/31/21 1331  Weight: 74.8 kg    Examination: General:  Resting comfortably in bed HENT: NCAT OP clear PULM: Coarse crackles RUL, no wheezing, normal effort CV: RRR, no mgr GI: BS+, soft, nontender MSK: normal bulk and tone Neuro: awake, alert, no distress, MAEW   Resolved Hospital Problem list     Assessment & Plan:  AKI with hyperkalemia and metabolic acidosis in setting of COVID-19> Unclear to me what caused this. Didn't help he has been consuming a lot of potassium orally Literature search does not reveal AKI as a common side effect of molnupiravir > renal consult > lokelma now > continue bicarbonate infusion > check urinalysis > repeat BMET at midnight and in AM > hold losartan > monitor UOP > renal ultrasound  Acute COVID-19  > solumedrol 0.5mg /kg IV q12h > hold remdesivir (no clear benefit in clinical trials) > hold tocilizumab as he doesn't have pneumonia  Acute on chronic respiratory failure with hypoxemia COPD with acute exacerbation > solumedrol > brovana/pulmicort/yupelri > albuterol prn > doxycycline 100mg  po bid  Concern for pulmonary embolism> lack of infiltrate with severe hypoxemia in a patient with COVID 19 who just returned from a long car trip> high risk for PE. Cannot perform CT angiogram given AKI > heparin per pharmacy > check d-dimer > check echo > check lower extremity doppler  COVID-19 with elevated troponin, likely demand ischemia vs less likely myocarditis > check echo > tele  Baseline carotid artery diease, PAD, CAD s/p CABG Aortic stenosis, s/p bovine valve replacement > continue metoprolol > asa > tele  Best Practice (right click and "Reselect all SmartList Selections" daily)   Diet/type: Regular consistency (see orders) DVT prophylaxis: systemic heparin GI prophylaxis: N/A Lines: N/A Foley:  N/A Code Status:  full code Last date of multidisciplinary goals of care discussion [11/15]  Labs    CBC: Recent Labs  Lab 10/31/21 1335  WBC 9.6  NEUTROABS 6.6  HGB 17.4*  HCT 51.8  MCV 91.8  PLT [id number]    Basic Metabolic Panel: Recent Labs  Lab 10/31/21 1435  NA 132*  K 7.2*  CL 105  CO2 15*  GLUCOSE 163*  BUN 70*  CREATININE 2.50*  CALCIUM 8.8*   GFR: Estimated Creatinine Clearance: 23.4 mL/min (A) (by C-G formula based on SCr of 2.5 mg/dL (H)). Recent Labs  Lab 10/31/21 1335  WBC 9.6    Liver Function Tests: Recent Labs  Lab 10/31/21 1435  AST 26  ALT 58*  ALKPHOS 112  BILITOT 1.0  PROT 7.1  ALBUMIN 3.9   No results for input(s): LIPASE, AMYLASE in the last 168 hours. No results for input(s): AMMONIA in the last 168 hours.  ABG    Component Value Date/Time   PHART 7.47 (H) 02/06/2021 1429   PCO2ART 36 02/06/2021 1429   PO2ART 61 (L) 02/06/2021 1429  HCO3 21.7 10/31/2021 1439   TCO2 23 02/20/2018 0151   ACIDBASEDEF 4.6 (H) 10/31/2021 1439   O2SAT 6.7 10/31/2021 1439     Coagulation Profile: No results for input(s): INR, PROTIME in the last 168 hours.  Cardiac Enzymes: No results for input(s): CKTOTAL, CKMB, CKMBINDEX, TROPONINI in the last 168 hours.  HbA1C: Hgb A1c MFr Bld  Date/Time Value Ref Range Status  01/12/2021 04:45 AM 7.5 (H) 4.8 - 5.6 % Final    Comment:    (NOTE) Pre diabetes:          5.7%-6.4%  Diabetes:              >6.4%  Glycemic control for   <7.0% adults with diabetes   02/05/2018 09:06 PM 7.0 (H) 4.8 - 5.6 % Final    Comment:    (NOTE) Pre diabetes:          5.7%-6.4% Diabetes:              >6.4% Glycemic control for   <7.0% adults with diabetes     CBG: No results for input(s): GLUCAP in the last 168 hours.  Review of Systems:   Gen: Denies fever, chills, weight change, fatigue, night sweats HEENT: Denies blurred vision, double vision, hearing loss, tinnitus, sinus congestion, rhinorrhea, sore throat, neck stiffness, dysphagia PULM: per HPI CV: Denies chest pain, edema, orthopnea, paroxysmal  nocturnal dyspnea, palpitations GI: Denies abdominal pain, nausea, vomiting, diarrhea, hematochezia, melena, constipation, change in bowel habits GU: Denies dysuria, hematuria, polyuria, oliguria, urethral discharge Endocrine: Denies hot or cold intolerance, polyuria, polyphagia or appetite change Derm: Denies rash, dry skin, scaling or peeling skin change Heme: Denies easy bruising, bleeding, bleeding gums Neuro: Denies headache, numbness, weakness, slurred speech, loss of memory or consciousness   Past Medical History:  He,  has a past medical history of Bilateral carotid bruits, CAD (coronary artery disease), Chronic lower back pain, COPD (chronic obstructive pulmonary disease) (HCC), GIB (gastrointestinal bleeding), HTN (hypertension), Hypercholesteremia, Ischemic cardiomyopathy, Moderate aortic stenosis, Myocardial infarction (HCC) (~ 1998/1999), S/P aortic valve replacement with bioprosthetic valve (02/19/2018), S/P CABG x 3 (02/19/2018), Tobacco abuse, and Type II diabetes mellitus (HCC).   Surgical History:   Past Surgical History:  Procedure Laterality Date   ABDOMINAL AORTOGRAM N/A 03/04/2019   Procedure: ABDOMINAL AORTOGRAM;  Surgeon: Iran Ouch, MD;  Location: [facility] IN[facility]ASIVE CV LAB;  Service: Cardiovascular;  Laterality: N/A;   AORTIC VALVE REPLACEMENT N/A 02/19/2018   Procedure: AORTIC VALVE REPLACEMENT (AVR);  Surgeon: Purcell Nails, MD;  Location: [facility] OR;  Service: Open Heart Surgery;  Laterality: N/A;   COLONOSCOPY     CORONARY ANGIOPLASTY WITH STENT PLACEMENT  ~ 1998/1999   CORONARY ARTERY BYPASS GRAFT N/A 02/19/2018   Procedure: CORONARY ARTERY BYPASS GRAFTING (CABG) x three , using left internal mammary artery and right leg greater saphenous vein harvested endoscopically;  Surgeon: Purcell Nails, MD;  [facility];  Service: Open Heart Surgery;  Laterality: N/A;   ESOPHAGOGASTRODUODENOSCOPY (EGD) WITH PROPOFOL N/A 03/18/2018   Procedure: ESOPHAGOGASTRODUODENOSCOPY  (EGD) WITH PROPOFOL;  Surgeon: Midge Minium, MD;  Location: [facility] ENDOSCOPY;  Service: Endoscopy;  Laterality: N/A;   LEFT HEART CATH AND CORONARY ANGIOGRAPHY N/A 02/06/2018   Procedure: LEFT HEART CATH AND CORONARY ANGIOGRAPHY;  Surgeon: Iran Ouch, MD;  Location: [facility] INVASIVE CV LAB;  Service: Cardiovascular;  Laterality: N/A;   LOWER EXTREMITY ANGIOGRAPHY Bilateral 03/04/2019   Procedure: Lower Extremity Angiography;  Surgeon: Iran Ouch, MD;  Location:  [facility] INVASIVE CV LAB;  Service: Cardiovascular;  Laterality: Bilateral;   PERIPHERAL VASCULAR ATHERECTOMY Left 03/04/2019   Procedure: PERIPHERAL VASCULAR ATHERECTOMY;  Surgeon: [place] Hampshire, MD;  Location: [facility] CV LAB;  Service: Cardiovascular;  Laterality: Left;  SFA   TEE WITHOUT CARDIOVERSION N/A 02/19/2018   Procedure: TRANSESOPHAGEAL ECHOCARDIOGRAM (TEE);  Surgeon: Rexene Alberts, MD;  Location: [place];  Service: Open Heart Surgery;  Laterality: N/A;   TONSILLECTOMY       Social History:   reports that he quit smoking about 3 years ago. His smoking use included cigarettes. He has a 40.00 pack-year smoking history. He has never used smokeless tobacco. He reports current alcohol use. He reports that he does not use drugs.   Family History:  His family history includes Lymphoma in his mother; Peripheral vascular disease in his father.   Allergies Allergies  Allergen Reactions   Atorvastatin Other (See Comments)    Leg aches and weakness      Home Medications  Prior to Admission medications   Medication Sig Start Date End Date Taking? Authorizing Provider  acetaminophen (TYLENOL) 500 MG tablet Take 1,000 mg by mouth daily as needed for moderate pain or headache.    [provider]  albuterol (PROVENTIL) (2.5 MG/3ML) 0.083% nebulizer solution Take 3 mLs (2.5 mg total) by nebulization every 6 (six) hours as needed for wheezing or shortness of breath. 03/01/21   Tyler Pita, MD  ALPRAZolam Duanne Moron)  0.5 MG tablet Take 0.5 mg by mouth 2 (two) times daily.    [provider]  aspirin EC 81 MG tablet Take 81 mg by mouth daily.    [provider]  budesonide (PULMICORT) 0.5 MG/2ML nebulizer solution Take 2 mLs (0.5 mg total) by nebulization 2 (two) times daily. Use AFTER Perforomist 03/01/21   Tyler Pita, MD  busPIRone (BUSPAR) 10 MG tablet Take 10 mg by mouth 2 (two) times daily. 04/15/21   [provider]  esomeprazole (NEXIUM) 40 MG capsule Take 40 mg by mouth at bedtime.     [provider]  ezetimibe (ZETIA) 10 MG tablet Take 10 mg by mouth at bedtime.     [provider]  folic acid (FOLVITE) A999333 MCG tablet Take 400 mcg by mouth every evening.    [provider]  formoterol (PERFOROMIST) 20 MCG/2ML nebulizer solution Take 2 mLs (20 mcg total) by nebulization 2 (two) times daily. 03/01/21   Tyler Pita, MD  furosemide (LASIX) 40 MG tablet Take 40 mg by mouth 2 (two) times daily.    [provider]  glipiZIDE (GLUCOTROL) 5 MG tablet Take 0.5 tablets (2.5 mg total) by mouth daily before breakfast. Patient taking differently: Take 5 mg by mouth See admin instructions. Take 5 mg in the morning and take a second 5 mg dose at night on Mon, Wed, and Fri 02/27/18   Conte, Tessa N, PA-C  guaiFENesin (MUCINEX) 600 MG 12 hr tablet Take 1,200 mg by mouth every 12 (twelve) hours as needed. 01/23/21   [provider]  losartan (COZAAR) 100 MG tablet Take 100 mg by mouth daily. 01/29/21   [provider]  metFORMIN (GLUCOPHAGE) 1000 MG tablet TAKE 1 TABLET BY MOUTH TWICE DAILY WITH A MEAL 04/27/18   [place] Gum, Collier Salina, MD  metoprolol succinate (TOPROL-XL) 50 MG 24 hr tablet Take 1 tablet (50 mg total) by mouth daily. 05/26/21   [place] Hampshire, MD  molnupiravir EUA (LAGEVRIO) 200 MG CAPS capsule  Take 4 capsules (800 mg total) by mouth 2 (two) times daily for 5 days. 10/29/21 11/03/21  Tyler Pita, MD  Nebulizers  (COMPRESSOR/NEBULIZER) MISC Use as directed 10/26/21   Tyler Pita, MD  [facility] HFA 108 [phone] Base) MCG/ACT inhaler Inhale 1 puff into the lungs every 4 (four) hours as needed for wheezing or shortness of breath. 02/04/19   [provider]  revefenacin (YUPELRI) 175 MCG/3ML nebulizer solution Take 3 mLs (175 mcg total) by nebulization daily. Can be mixed with Perforomist 03/01/21   Tyler Pita, MD  traZODone (DESYREL) 50 MG tablet Take 50 mg by mouth at bedtime as needed. 02/09/21   [provider]  Vitamin D, Cholecalciferol, 1000 units TABS Take 1,000 Units by mouth every morning.     [provider]     Critical care time: 45 minutes    Roselie Awkward, MD [place] [facility] Pager: [phone] Cell: [phone] After 7:00 pm call Elink  [phone]

## 2021-10-31 NOTE — ED Notes (Signed)
Pt noted turning up his O2, informed pt to keep it 4L [place] his O2 is 97-100%

## 2021-10-31 NOTE — Progress Notes (Signed)
Admit to ICU-18. Alert and oriented to person/place/time/situation. Denies pain. On 15 L HFNC sats 98%. Decreased to 10 L and sats remain at 95-96%. Denies shortness of breath at rest but c/o sob w/ exertion. VS stable . Heparin gtt infusing at 1300 units/hr and sodium bicarb gtt infusing at 100cc/hr upon admit to ICU. Stg 1 and healing stg 2 on sacral area. Applied protective cream and sacral foam dressing.  Call light in reach and BG 209. Continuing to monitor.

## 2021-10-31 NOTE — ED Notes (Signed)
Kelley RN aware of assigned bed 

## 2021-11-01 ENCOUNTER — Inpatient Hospital Stay: Payer: Medicare Other

## 2021-11-01 ENCOUNTER — Inpatient Hospital Stay (HOSPITAL_COMMUNITY)
Admit: 2021-11-01 | Discharge: 2021-11-01 | Disposition: A | Discharge status: Home / Self Care | Payer: Medicare Other | Attending: Pulmonary Disease | Admitting: Pulmonary Disease

## 2021-11-01 DIAGNOSIS — E8721 Acute metabolic acidosis: Secondary | ICD-10-CM | POA: Diagnosis not present

## 2021-11-01 DIAGNOSIS — L8990 Pressure ulcer of unspecified site, unspecified stage: Secondary | ICD-10-CM | POA: Insufficient documentation

## 2021-11-01 DIAGNOSIS — R0609 Other forms of dyspnea: Secondary | ICD-10-CM | POA: Diagnosis not present

## 2021-11-01 DIAGNOSIS — U071 COVID-19: Secondary | ICD-10-CM | POA: Diagnosis not present

## 2021-11-01 LAB — ECHOCARDIOGRAM COMPLETE
AR max vel: 2.29 cm2
AV Area VTI: 2.85 cm2
AV Area mean vel: 2.12 cm2
AV Mean grad: 1 mmHg
AV Peak grad: 2.3 mmHg
Ao pk vel: 0.76 m/s
Area-P 1/2: 3.61 cm2
Height: 65 in
S' Lateral: 3.13 cm
Weight: 2814.83 oz

## 2021-11-01 LAB — BASIC METABOLIC PANEL
Anion gap: 10 (ref 5–15)
Anion gap: 8 (ref 5–15)
BUN: 72 mg/dL — ABNORMAL HIGH (ref 8–23)
BUN: 76 mg/dL — ABNORMAL HIGH (ref 8–23)
CO2: 17 mmol/L — ABNORMAL LOW (ref 22–32)
CO2: 22 mmol/L (ref 22–32)
Calcium: 8 mg/dL — ABNORMAL LOW (ref 8.9–10.3)
Calcium: 8.5 mg/dL — ABNORMAL LOW (ref 8.9–10.3)
Chloride: 103 mmol/L (ref 98–111)
Chloride: 105 mmol/L (ref 98–111)
Creatinine, Ser: 2 mg/dL — ABNORMAL HIGH (ref 0.61–1.24)
Creatinine, Ser: 2.29 mg/dL — ABNORMAL HIGH (ref 0.61–1.24)
GFR, Estimated: 29 mL/min — ABNORMAL LOW (ref >60)
GFR, Estimated: 34 mL/min — ABNORMAL LOW (ref >60)
Glucose, Bld: 227 mg/dL — ABNORMAL HIGH (ref 70–99)
Glucose, Bld: 244 mg/dL — ABNORMAL HIGH (ref 70–99)
Potassium: 6 mmol/L — ABNORMAL HIGH (ref 3.5–5.1)
Potassium: 6.1 mmol/L — ABNORMAL HIGH (ref 3.5–5.1)
Sodium: 132 mmol/L — ABNORMAL LOW (ref 135–145)
Sodium: 133 mmol/L — ABNORMAL LOW (ref 135–145)

## 2021-11-01 LAB — MAGNESIUM: Magnesium: 2.2 mg/dL (ref 1.7–2.4)

## 2021-11-01 LAB — URINALYSIS, COMPLETE (UACMP) WITH MICROSCOPIC
Bacteria, UA: NONE SEEN
Bilirubin Urine: NEGATIVE
Glucose, UA: 150 mg/dL — AB
Hgb urine dipstick: NEGATIVE
Ketones, ur: 5 mg/dL — AB
Leukocytes,Ua: NEGATIVE
Nitrite: NEGATIVE
Protein, ur: 100 mg/dL — AB
Specific Gravity, Urine: 1.017 (ref 1.005–1.030)
pH: 5 (ref 5.0–8.0)

## 2021-11-01 LAB — CBC
HCT: 43.4 % (ref 39.0–52.0)
Hemoglobin: 15.1 g/dL (ref 13.0–17.0)
MCH: 31.1 pg (ref 26.0–34.0)
MCHC: 34.8 g/dL (ref 30.0–36.0)
MCV: 89.5 fL (ref 80.0–100.0)
Platelets: 171 10*3/uL (ref 150–400)
RBC: 4.85 MIL/uL (ref 4.22–5.81)
RDW: 13.5 % (ref 11.5–15.5)
WBC: 6.7 10*3/uL (ref 4.0–10.5)
nRBC: 0 % (ref 0.0–0.2)

## 2021-11-01 LAB — HEPATITIS B SURFACE ANTIGEN: Hepatitis B Surface Ag: NONREACTIVE

## 2021-11-01 LAB — RESP PANEL BY RT-PCR (FLU A&B, COVID) ARPGX2
Influenza A by PCR: NEGATIVE
Influenza B by PCR: NEGATIVE
SARS Coronavirus 2 by RT PCR: POSITIVE — AB

## 2021-11-01 LAB — GLUCOSE, CAPILLARY
Glucose-Capillary: 209 mg/dL — ABNORMAL HIGH (ref 70–99)
Glucose-Capillary: 277 mg/dL — ABNORMAL HIGH (ref 70–99)
Glucose-Capillary: 267 mg/dL — ABNORMAL HIGH (ref 70–99)
Glucose-Capillary: 220 mg/dL — ABNORMAL HIGH (ref 70–99)
Glucose-Capillary: 348 mg/dL — ABNORMAL HIGH (ref 70–99)

## 2021-11-01 LAB — FERRITIN: Ferritin: 108 ng/mL (ref 24–336)

## 2021-11-01 LAB — HEPARIN LEVEL (UNFRACTIONATED)
Heparin Unfractionated: 0.54 IU/mL (ref 0.30–0.70)
Heparin Unfractionated: 0.55 IU/mL (ref 0.30–0.70)

## 2021-11-01 LAB — PROTEIN / CREATININE RATIO, URINE
Creatinine, Urine: 68 mg/dL
Protein Creatinine Ratio: 0.99 mg/mg{Cre} — ABNORMAL HIGH (ref 0.00–0.15)
Total Protein, Urine: 67 mg/dL

## 2021-11-01 LAB — PROCALCITONIN: Procalcitonin: <0.1 ng/mL

## 2021-11-01 LAB — HEPATITIS B SURFACE ANTIBODY,QUALITATIVE: Hep B S Ab: NONREACTIVE

## 2021-11-01 LAB — FIBRINOGEN: Fibrinogen: 391 mg/dL (ref 210–475)

## 2021-11-01 LAB — C-REACTIVE PROTEIN
CRP: 1.7 mg/dL — ABNORMAL HIGH (ref <1.0)
CRP: 1.7 mg/dL — ABNORMAL HIGH (ref <1.0)

## 2021-11-01 LAB — CK: Total CK: 69 U/L (ref 49–397)

## 2021-11-01 LAB — HEMOGLOBIN A1C
Hgb A1c MFr Bld: 6.8 % — ABNORMAL HIGH (ref 4.8–5.6)
Mean Plasma Glucose: 148.46 mg/dL

## 2021-11-01 LAB — HEPATITIS B CORE ANTIBODY, TOTAL: Hep B Core Total Ab: NONREACTIVE

## 2021-11-01 LAB — PHOSPHORUS: Phosphorus: 3.5 mg/dL (ref 2.5–4.6)

## 2021-11-01 LAB — D-DIMER, QUANTITATIVE
D-Dimer, Quant: 3.35 ug/mL-FEU — ABNORMAL HIGH (ref 0.00–0.50)
D-Dimer, Quant: 5.17 ug/mL-FEU — ABNORMAL HIGH (ref 0.00–0.50)

## 2021-11-01 LAB — LACTATE DEHYDROGENASE: LDH: 206 U/L — ABNORMAL HIGH (ref 98–192)

## 2021-11-01 LAB — HEPATITIS C ANTIBODY: HCV Ab: NONREACTIVE

## 2021-11-01 MED ORDER — FLUTICASONE FUROATE-VILANTEROL 100-25 MCG/ACT IN AEPB
1.0000 | INHALATION_SPRAY | Freq: Every day | RESPIRATORY_TRACT | Status: DC
  Administered 2021-11-01 – 2021-11-06 (×6): 1 via RESPIRATORY_TRACT
  Filled 2021-11-01 (×3): qty 28

## 2021-11-01 MED ORDER — UMECLIDINIUM BROMIDE 62.5 MCG/ACT IN AEPB
1.0000 | INHALATION_SPRAY | Freq: Every day | RESPIRATORY_TRACT | Status: DC
  Administered 2021-11-01 – 2021-11-06 (×6): 1 via RESPIRATORY_TRACT
  Filled 2021-11-01 (×2): qty 7

## 2021-11-01 MED ORDER — OXYMETAZOLINE HCL 0.05 % NA SOLN
2.0000 | Freq: Two times a day (BID) | NASAL | Status: AC | PRN
  Administered 2021-11-01 – 2021-11-03 (×2): 2 via NASAL
  Filled 2021-11-01 (×2): qty 15

## 2021-11-01 MED ORDER — CHLORHEXIDINE GLUCONATE CLOTH 2 % EX PADS
6.0000 | MEDICATED_PAD | Freq: Every day | CUTANEOUS | Status: DC
  Administered 2021-11-01 – 2021-11-05 (×5): 6 via TOPICAL

## 2021-11-01 NOTE — Progress Notes (Signed)
Inpatient Diabetes Program Recommendations  AACE/ADA: New Consensus Statement on Inpatient Glycemic Control (2015)  Target Ranges:  Prepandial:   less than 140 mg/dL      Peak postprandial:   less than 180 mg/dL (1-2 hours)      Critically ill patients:  140 - 180 mg/dL   Lab Results  Component Value Date   GLUCAP 267 (H) 11/01/2021   HGBA1C 6.8 (H) 11/01/2021    Review of Glycemic Control Results for Harold Parker, Harold Parker (MRN [id number]) as of 11/01/2021 12:29  Ref. Range 02/09/2021 08:52 10/31/2021 23:15 11/01/2021 08:07 11/01/2021 11:47  Glucose-Capillary Latest Ref Range: 70 - 99 mg/dL 756 (H) 433 (H) 295 (H) 267 (H)   Diabetes history: DM2 Outpatient Diabetes medications: Glucotrol 10 mg M-W-F & other days 5 mg qd, Metformin, 1 gm bid Current orders for Inpatient glycemic control: Novolog 0-9 units tid, 0-5 units hs, Solumedrol 37.6 mg bid, then will start Prednisone on 11/19  Inpatient Diabetes Program Recommendations:   Please Consider while on steroids: -Increase Novolog correction to 0-15 units q 4 hrs. Secure chat to Dr. Kendrick Fries.  Thank you, Billy Fischer. Jeymi Hepp, RN, MSN, CDE  Diabetes Coordinator Inpatient Glycemic Control Team Team Pager [phone] (8am-5pm) 11/01/2021 12:31 PM

## 2021-11-01 NOTE — Progress Notes (Signed)
PHARMACY CONSULT NOTE  Pharmacy Consult for Electrolyte Monitoring and Replacement   Recent Labs: Potassium (mmol/L)  Date Value  11/01/2021 6.0 (H)   Magnesium (mg/dL)  Date Value  05/17/5614 2.2   Calcium (mg/dL)  Date Value  37/94/3276 8.0 (L)   Albumin (g/dL)  Date Value  14/70/9295 3.9  03/27/2018 3.8   Phosphorus (mg/dL)  Date Value  74/73/4037 3.5   Sodium (mmol/L)  Date Value  11/01/2021 133 (L)  02/04/2019 143   Assessment: 75 y.o. male with past medical history of hypertension, hyperlipidemia, CAD, CHF, diabetes, and COPD on 4 L who presents to the ED complaining of shortness of breath. Pharmacy has been consulted for electrolyte replacement.   Scr improving (2.5 >> 2). On bicarbonate infusion  Goal of Therapy:  Electrolytes within normal limits  Plan:  Stable hyponatremia, monitor K 6, continue bicarbonate gtt & Lokelma Recheck electrolytes with AM labs  Tressie Ellis 11/01/2021 8:06 AM

## 2021-11-01 NOTE — Telephone Encounter (Signed)
Agree, patient has been instructed previously to go to the ED if his symptoms worsened.

## 2021-11-01 NOTE — Progress Notes (Signed)
Patient with slow oozing nose bleed.  Harlon Ditty, NP notified.  Orders to be placed by NP.

## 2021-11-01 NOTE — Progress Notes (Addendum)
NAME:  Harold Parker, MRN:  [id number], DOB:  03/13/1946, LOS: 1 ADMISSION DATE:  10/31/2021, CONSULTATION DATE:  11/15 REFERRING MD:  Charna Archer, CHIEF COMPLAINT:  Dyspnea   History of Present Illness:  75 year old male with a past medical history significant for COPD and severe COVID-19 pneumonia who has chronic respiratory failure with hypoxemia requiring 4 L of oxygen continuously came to the [facility] on October 31, 2021 with a chief complaint of generalized weakness, shortness of breath, hypoxemia, and a recent diagnosis of COVID-19.  He is followed by my partner Dr. Patsey Berthold for COPD and complications from his's COVID-19 pneumonia which she experienced in early 2022.  He recently traveled to [place] and returned home several days ago and noted on the drive home that he was feeling more short of breath.  He denied any leg pain or swelling.  He did have body aches, some fevers chills and increased cough with occasional production of green mucus.  He performed a home test for COVID-19 which is noted to be positive.  He called Dr. Patsey Berthold who called in a prescription of molnupiravir.Memory Dance.  He has been taking that at home.  His appetite at home has been very poor.  He has not had much solid food to eat in the last few days but he has been drinking a lot of fluids.  Specifically he says he has been drinking many "body armor" energy drinks which incidentally contain electrolyte solution.  He has also been eating bananas.  He says that he has been urinating fine, denies any sort of hematuria or dysuria.  He has had some mild abdominal pain.  Because of worsening shortness of breath he presented to the [facility] for further evaluation.  Pulmonary and critical care medicine was consulted because of worsening hypoxemic respiratory failure and acute kidney injury with hyperkalemia.  In the emergency room the patient received IV fluids and standard hyperkalemia  treatment.  He denies nausea vomiting or diarrhea.  Pertinent  Medical History   Past Medical History:  Diagnosis Date   Bilateral carotid bruits    a. 01/2018 U/S: < 50% bilat ICA stenosis.   CAD (coronary artery disease)    a. 1998 s/p MI and BMS [place], [place]); b. 1999 redo PCI/rotablator in setting of what sounds like ISR;  c. Multiple stress tests over the years - last ~ 2017, reportedly nl; d. 01/2018 NSTEMI/Cath: LM 45m/d, LAD 50p, 40p/m, D1 60ost, OM1 95, RCA 100ost/p w/ L->R collats, EF 45%; e. s/p 3V CABG 02/19/18 (LIMA-LAD, VG-D1, VG-OM)   Chronic lower back pain    COPD (chronic obstructive pulmonary disease) (HCC)    GIB (gastrointestinal bleeding)    a. 02/2018 GIB and anemia w/ Hgb of 4.7 on presentation; b. 03/2018 EGD: 2 nonbleeding duodenal ulcers.   HTN (hypertension)    Hypercholesteremia    Ischemic cardiomyopathy    a. 01/2018 Echo: EF 40-45%, mid-apicalanteroseptal, ant, apical sev HK, mod apicalinferior HK. Gr2 DD. Mod AS, mild MR, mod dil LA, PASP 77mmHg.   Moderate aortic stenosis    a. 01/2018 Echo: Mod AS, mean grad (S) 72mmHg, Valve area (VTI) 1.06 cm^2, (Vmax) 1.27 cm^2; b. 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 Diffusely increased echogenicity of the renal parenchyma, likely secondary to medical renal disease.  2. Bilateral simple renal cysts. 11/15 LE doppler vasc ultrasound neg for dvt bilaterally  Interim History / Subjective:   Feels well Oxygen level  improved Renal function improved Appetite OK K down slightly  Objective   Blood pressure (!) 156/64, pulse 69, temperature (!) 97 F (36.1 C), temperature source Oral, resp. rate (!) 23, height 5\' 5"  (1.651 m), weight 79.8 kg, SpO2 95 %.        Intake/Output Summary (Last 24 hours) at 11/01/2021 1212 Last data filed at 11/01/2021 1100 Gross per 24 hour  Intake 2300.37 ml  Output 1325 ml  Net 975.37 ml   Filed Weights   10/31/21 1331 11/01/21 0129  Weight: 74.8 kg 79.8 kg    Examination:  General:  Resting comfortably in bed HENT: NCAT OP clear PULM: Poor air movement B, normal effort CV: RRR, no mgr GI: BS+, soft, nontender MSK: normal bulk and tone Neuro: awake, alert, no distress, MAEW   Resolved Hospital Problem list     Assessment & Plan:  AKI with hyperkalemia and metabolic acidosis in setting of COVID-19> improved slightly Literature search does not reveal AKI as a common side effect of molnupiravir > renal consult appreciated > f/u BMET, urine studies > monitor UOP > renal dose medications  Acute COVID-19 > improving > solumedrol, then prednisone per protocol > hold remdesivir  Acute on chronic respiratory failure with hypoxemia COPD with acute exacerbation > solumedrol as above > doxycycline 5 days > brovana/pulmicort/yupelri > prn albuterol  Concern for pulmonary embolism> LE doppler vasc ultrasound neg for dvt> heparin per pharmacy > heparin per pharmacy > f/u echo, if normal then d/c heparin > if echo shows RV strain/dysfunction then V/Q scan  COVID-19 with elevated troponin, likely demand ischemia vs less likely myocarditis > f/u echo > tele  Baseline carotid artery diease, PAD, CAD s/p CABG Aortic stenosis, s/p bovine valve replacement > metoprolol > asa > tele  To TRH  Best Practice (right click and "Reselect all SmartList Selections" daily)   Diet/type: Regular consistency (see orders) DVT prophylaxis: systemic heparin GI  prophylaxis: N/A Lines: N/A Foley:  N/A Code Status:  DNR Last date of multidisciplinary goals of care discussion [11/15]  Labs   CBC: Recent Labs  Lab 10/31/21 1335 11/01/21 0258  WBC 9.6 6.7  NEUTROABS 6.6  --   HGB 17.4* 15.1  HCT 51.8 43.4  MCV 91.8 89.5  PLT 235 171    Basic Metabolic Panel: Recent Labs  Lab 10/31/21 1435 11/01/21 0001 11/01/21 0258  NA 132* 132* 133*  K 7.2* 6.1* 6.0*  CL 105 105 103  CO2 15* 17* 22  GLUCOSE 163* 227* 244*  BUN 70* 76* 72*  CREATININE 2.50* 2.29* 2.00*  CALCIUM 8.8* 8.5* 8.0*  MG  --   --  2.2  PHOS  --   --  3.5   GFR: Estimated Creatinine Clearance: 30.1 mL/min (A) (by C-G formula based on SCr of 2 mg/dL (H)). Recent Labs  Lab 10/31/21 1335 10/31/21 2337 11/01/21 0258  PROCALCITON  --  <0.10  --   WBC 9.6  --  6.7    Liver Function Tests: Recent Labs  Lab 10/31/21 1435  AST 26  ALT 58*  ALKPHOS 112  BILITOT 1.0  PROT 7.1  ALBUMIN 3.9   No results for input(s): LIPASE, AMYLASE in the last 168 hours. No results for input(s): AMMONIA in the last 168 hours.  ABG    Component Value Date/Time   PHART 7.47 (H) 02/06/2021 1429   PCO2ART 36 02/06/2021 1429   PO2ART 61 (L) 02/06/2021 1429   HCO3 21.7 10/31/2021 1439   TCO2 23 02/20/2018 0151   ACIDBASEDEF 4.6 (H) 10/31/2021 1439   O2SAT 6.7 10/31/2021 1439     Coagulation Profile: Recent Labs  Lab 10/31/21 2103  INR 1.2    Cardiac Enzymes: Recent Labs  Lab 11/01/21 0913  CKTOTAL 69    HbA1C: Hgb A1c MFr Bld  Date/Time Value Ref Range Status  11/01/2021 12:01 AM 6.8 (H) 4.8 - 5.6 % Final    Comment:    (NOTE) Pre diabetes:          5.7%-6.4%  Diabetes:              >6.4%  Glycemic control for   <7.0% adults with diabetes   01/12/2021 04:45 AM 7.5 (H) 4.8 - 5.6 % Final    Comment:    (NOTE) Pre diabetes:          5.7%-6.4%  Diabetes:              >6.4%  Glycemic control for   <7.0% adults with diabetes     CBG: Recent Labs   Lab 10/31/21 2315 11/01/21 0807 11/01/21 1147  GLUCAP 209* 277* 267*    Critical care time: n/a minutes    Roselie Awkward, MD [place] [facility] Pager: [phone] Cell: [phone] After 7:00 pm call Elink  [phone]

## 2021-11-01 NOTE — Progress Notes (Signed)
*  PRELIMINARY RESULTS* Echocardiogram 2D Echocardiogram has been performed.  Harold Parker 11/01/2021, 1:49 PM

## 2021-11-01 NOTE — Progress Notes (Signed)
[facility] PCCM  Echo shows enlarged RV Check VQ scan for PE Continue heparin infusion  Heber Dundee, MD  PCCM Pager: [phone] Cell: [phone] After 7:00 pm call Elink  [phone]

## 2021-11-01 NOTE — TOC Initial Note (Signed)
Transition of Care [facility]) - Initial/Assessment Note    Patient Details  Name: Harold Parker MRN: [id number] Date of Birth: 12/07/1946  [facility] of [facility]) CM/SW Contact:    Kerin Salen, RN Phone Number: 11/01/2021, 3:02 PM  Clinical Narrative:  Patient NMS on Oxygen 8LHF. TOC will track and assess when stable.                       Patient Goals and CMS Choice        Expected Discharge Plan and Services                                                Prior Living Arrangements/Services                       Activities of Daily Living Home Assistive Devices/Equipment: None ADL Screening (condition at time of admission) Patient's cognitive ability adequate to safely complete daily activities?: Yes Is the patient deaf or have difficulty hearing?: No Does the patient have difficulty seeing, even when wearing glasses/contacts?: No Does the patient have difficulty concentrating, remembering, or making decisions?: No Patient able to express need for assistance with ADLs?: Yes Does the patient have difficulty dressing or bathing?: No Independently performs ADLs?: Yes (appropriate for developmental age) Does the patient have difficulty walking or climbing stairs?: No Weakness of Legs: Both Weakness of Arms/Hands: None  Permission Sought/Granted                  Emotional Assessment              Admission diagnosis:  Hyperkalemia [E87.5] AKI (acute kidney injury) ([place]) [N17.9] Acute respiratory failure, unspecified whether with hypoxia or hypercapnia ([place]) [J96.00] Acute renal failure, unspecified acute renal failure type ([place]) [N17.9] Acute hypercapnic respiratory failure ([place]) [J96.02] COVID-19 [U07.1] Patient Active Problem List   Diagnosis Date Noted   Pressure injury of skin 0000000   Acute metabolic acidosis 0000000   COVID-19 10/31/2021   Chronic respiratory failure with hypoxia ([place]) 05/02/2021   Insomnia     History of COVID-19 pneumonia 01/11/21 02/04/2021   Pulmonary nodules    Acute respiratory disease due to COVID-19 virus 01/11/2021   HTN (hypertension)    Chronic systolic CHF (congestive heart failure) (HCC)    COPD (chronic obstructive pulmonary disease) (HCC)    GERD (gastroesophageal reflux disease)    Anxiety    AKI (acute kidney injury) ([place])    Hyperkalemia    Elevated troponin    Acute hypoxemic respiratory failure due to COVID-19 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   S/P aortic valve replacement with bioprosthetic valve  02/19/2018   Preoperative respiratory examination 02/13/2018   Acute on chronic respiratory failure with hypoxia ([place]) 02/11/2018   Influenza with respiratory manifestation 02/11/2018   Stage 3 severe COPD by GOLD classification ([place])    Aortic stenosis    Bronchitis, chronic obstructive, with exacerbation (HCC)    Tobacco abuse    CAD (coronary artery disease) [id number]   Acute systolic CHF (congestive heart failure) ([place]) 02/07/2018   Ischemic cardiomyopathy 02/07/2018   Type 2 diabetes mellitus with hyperglycemia, without long-term current use of insulin ([place]) 02/07/2018  Hyperlipidemia LDL goal <70 02/07/2018   COPD with acute exacerbation (HCC) 02/05/2018   NSTEMI (non-ST elevated myocardial infarction) (HCC) 02/05/2018   PCP:  Merlene Laughter, MD Pharmacy:   [id number] - [place], [place] - [address] AT [facility] OF [address] [facility] [address] [place] [address] Phone: [phone] Fax: [phone]  [id number] - [place], [place] - [address] [place] [address] Phone: [phone] Fax: [phone]  [id number] - [address] [place], [place] - [address] AT [facility] OF [facility] [address] & [address] [place] [address] Phone: [phone] Fax:  [phone]     Social Determinants of Health (SDOH) Interventions    Readmission Risk Interventions Readmission Risk Prevention Plan 02/06/2021 01/13/2021  Transportation Screening Complete Complete  PCP or Specialist Appt within 3-5 Days Complete Complete  HRI or Home Care Consult Complete Complete  Social Work Consult for Recovery Care Planning/Counseling Complete Complete  Palliative Care Screening Not Applicable Not Applicable  Medication Review Oceanographer) Complete Complete  Some recent data might be hidden

## 2021-11-01 NOTE — Consult Note (Signed)
[facility]  CONSULT NOTE    Date: 11/01/2021                  Patient Name:  Harold Parker  MRN: [id number]  DOB: 03-12-1946  Age / Sex: 75 y.o., male         PCP: Lajean Manes, MD                 Service Requesting Consult: Dr. Lake Bells                 Reason for Consult: Acute kidney injury            History of Present Illness: Harold Parker presents to [facility] after 3-4 days of body aches, cough, shortness of breath and home diagnosis of COVID-19 after visiting [place]. Patient was placed on molnupiravir. He felt some nausea and but reports eating and drinking appropriately. He continued to take all his medications as prescribed including his furosemide and losartan. Denies any use of nonsteroidal anti-inflammatory agents.   Patient found to have acute kidney injury with hyperkalemia, metabolic acidosis and hyponatremia on admission. Medically treated and placed on Lokelma and bicarb gtt. Patient's creatinine has improved but potassium remains elevated. Nephrology was consulted.   Patient is at baseline 4 liter O2. Placed on HFNC at 8L. He states he is feeling better.  Patient reports that he has been drinking sports drinks at home which include potassium.   Patient's father was on dialysis before passing for "poor circulation."   Medications: Outpatient medications: Medications Prior to Admission  Medication Sig Dispense Refill Last Dose   acetaminophen (TYLENOL) 500 MG tablet Take 1,000 mg by mouth daily as needed for moderate pain or headache.   Past Month   albuterol (PROVENTIL) (2.5 MG/3ML) 0.083% nebulizer solution Take 3 mLs (2.5 mg total) by nebulization every 6 (six) hours as needed for wheezing or shortness of breath. 75 mL 12 Past Week   ALPRAZolam (XANAX) 0.5 MG tablet Take 0.5 mg by mouth 2 (two) times daily.   10/31/2021   aspirin EC 81 MG tablet Take 81 mg by mouth daily.   10/31/2021   budesonide (PULMICORT) 0.5 MG/2ML nebulizer  solution Take 2 mLs (0.5 mg total) by nebulization 2 (two) times daily. Use AFTER Perforomist 120 mL 11 10/31/2021   busPIRone (BUSPAR) 10 MG tablet Take 10 mg by mouth 2 (two) times daily.   10/31/2021   esomeprazole (NEXIUM) 40 MG capsule Take 40 mg by mouth at bedtime.    10/30/2021   ezetimibe (ZETIA) 10 MG tablet Take 10 mg by mouth at bedtime.    [id number]   folic acid (FOLVITE) A999333 MCG tablet Take 400 mcg by mouth every evening.   10/30/2021   formoterol (PERFOROMIST) 20 MCG/2ML nebulizer solution Take 2 mLs (20 mcg total) by nebulization 2 (two) times daily. 120 mL 11 10/31/2021   furosemide (LASIX) 40 MG tablet Take 40 mg by mouth 2 (two) times daily.   10/31/2021   glipiZIDE (GLUCOTROL) 5 MG tablet Take 0.5 tablets (2.5 mg total) by mouth daily before breakfast. (Patient taking differently: Take 5 mg by mouth See admin instructions. Take 5 mg in the morning and take a second 5 mg dose at night on Mon, Wed, and Fri) 30 tablet 1 10/31/2021   guaiFENesin (MUCINEX) 600 MG 12 hr tablet Take 1,200 mg by mouth every 12 (twelve) hours as needed.   Past Week   losartan (COZAAR) 100 MG tablet Take  100 mg by mouth daily.   10/31/2021   metFORMIN (GLUCOPHAGE) 1000 MG tablet TAKE 1 TABLET BY MOUTH TWICE DAILY WITH A MEAL 60 tablet 0 10/31/2021   metoprolol succinate (TOPROL-XL) 50 MG 24 hr tablet Take 1 tablet (50 mg total) by mouth daily. 90 tablet 2 10/30/2021   molnupiravir EUA (LAGEVRIO) 200 MG CAPS capsule Take 4 capsules (800 mg total) by mouth 2 (two) times daily for 5 days. 40 capsule 0 10/31/2021   PROAIR HFA 108 (90 Base) MCG/ACT inhaler Inhale 1 puff into the lungs every 4 (four) hours as needed for wheezing or shortness of breath.   Past Week   revefenacin (YUPELRI) 175 MCG/3ML nebulizer solution Take 3 mLs (175 mcg total) by nebulization daily. Can be mixed with Perforomist 90 mL 11 10/31/2021   traZODone (DESYREL) 50 MG tablet Take 50 mg by mouth at bedtime as needed.   10/30/2021    Vitamin D, Cholecalciferol, 1000 units TABS Take 1,000 Units by mouth every morning.    10/31/2021   Nebulizers (COMPRESSOR/NEBULIZER) MISC Use as directed 1 each 0     Current medications: Current Facility-Administered Medications  Medication Dose Route Frequency Provider Last Rate Last Admin   albuterol (PROVENTIL) (2.5 MG/3ML) 0.083% nebulizer solution 2.5 mg  2.5 mg Nebulization Q6H PRN Juanito Doom, MD       ALPRAZolam Duanne Moron) tablet 0.5 mg  0.5 mg Oral BID Simonne Maffucci B, MD   0.5 mg at 10/31/21 2241   aspirin EC tablet 81 mg  81 mg Oral Daily Juanito Doom, MD       busPIRone (BUSPAR) tablet 10 mg  10 mg Oral BID Simonne Maffucci B, MD   10 mg at 10/31/21 2250   Chlorhexidine Gluconate Cloth 2 % PADS 6 each  6 each Topical Daily Juanito Doom, MD       chlorpheniramine-HYDROcodone (TUSSIONEX) 10-8 MG/5ML suspension 5 mL  5 mL Oral Q12H PRN Juanito Doom, MD       docusate sodium (COLACE) capsule 100 mg  100 mg Oral BID PRN Juanito Doom, MD       doxycycline (VIBRA-TABS) tablet 100 mg  100 mg Oral Q12H Simonne Maffucci B, MD   100 mg at 10/31/21 2109   fluticasone furoate-vilanterol (BREO ELLIPTA) 100-25 MCG/ACT 1 puff  1 puff Inhalation Daily Darel Hong D, NP       And   umeclidinium bromide (INCRUSE ELLIPTA) 62.5 MCG/ACT 1 puff  1 puff Inhalation Daily Darel Hong D, NP       folic acid (FOLVITE) tablet 0.5 mg  500 mcg Oral QPM Simonne Maffucci B, MD       guaiFENesin (MUCINEX) 12 hr tablet 1,200 mg  1,200 mg Oral Q12H PRN Simonne Maffucci B, MD   1,200 mg at 11/01/21 0059   guaiFENesin-dextromethorphan (ROBITUSSIN DM) 100-10 MG/5ML syrup 10 mL  10 mL Oral Q4H PRN Simonne Maffucci B, MD       heparin ADULT infusion 100 units/mL (25000 units/243mL)  1,300 Units/hr Intravenous Continuous Rauer, Forde Dandy, RPH 13 mL/hr at 10/31/21 2110 1,300 Units/hr at 10/31/21 2110   insulin aspart (novoLOG) injection 0-5 Units  0-5 Units Subcutaneous QHS Simonne Maffucci B, MD   2 Units at 11/01/21 0059   insulin aspart (novoLOG) injection 0-9 Units  0-9 Units Subcutaneous TID WC Simonne Maffucci B, MD       methylPREDNISolone sodium succinate (SOLU-MEDROL) 40 mg/mL injection 37.6 mg  0.5 mg/kg Intravenous Q12H McQuaid,  Brooke Pace, MD   37.6 mg at 11/01/21 0058   Followed by   Melene Muller ON 11/04/2021] predniSONE (DELTASONE) tablet 50 mg  50 mg Oral Daily Lupita Leash, MD       metoprolol succinate (TOPROL-XL) 24 hr tablet 50 mg  50 mg Oral Daily Lupita Leash, MD       ondansetron (ZOFRAN) injection 4 mg  4 mg Intravenous Q6H PRN Lupita Leash, MD       pantoprazole (PROTONIX) EC tablet 40 mg  40 mg Oral Daily McQuaid, Riley Lam B, MD       polyethylene glycol (MIRALAX / GLYCOLAX) packet 17 g  17 g Oral Daily PRN Max Fickle B, MD       sodium bicarbonate 75 mEq in sodium chloride 0.45 % 1,075 mL infusion   Intravenous Continuous Lupita Leash, MD 75 mL/hr at 11/01/21 0624 New Bag at 11/01/21 0624   sodium zirconium cyclosilicate (LOKELMA) packet 10 g  10 g Oral BID Max Fickle B, MD   10 g at 11/01/21 0058   traZODone (DESYREL) tablet 50 mg  50 mg Oral QHS PRN Lupita Leash, MD   50 mg at 10/31/21 2241      Allergies: Allergies  Allergen Reactions   Atorvastatin Other (See Comments)    Leg aches and weakness       Past Medical History: Past Medical History:  Diagnosis Date   Bilateral carotid bruits    a. 01/2018 U/S: < 50% bilat ICA stenosis.   CAD (coronary artery disease)    a. 1998 s/p MI and BMS [place]); b. 1999 redo PCI/rotablator in setting of what sounds like ISR;  c. Multiple stress tests over the years - last ~ 2017, reportedly nl; d. 01/2018 NSTEMI/Cath: LM 32m/d, LAD 50p, 40p/m, D1 60ost, OM1 95, RCA 100ost/p w/ L->R collats, EF 45%; e. s/p 3V CABG 02/19/18 (LIMA-LAD, VG-D1, VG-OM)   Chronic lower back pain    COPD (chronic obstructive pulmonary disease) (HCC)    GIB (gastrointestinal bleeding)     a. 02/2018 GIB and anemia w/ Hgb of 4.7 on presentation; b. 03/2018 EGD: 2 nonbleeding duodenal ulcers.   HTN (hypertension)    Hypercholesteremia    Ischemic cardiomyopathy    a. 01/2018 Echo: EF 40-45%, mid-apicalanteroseptal, ant, apical sev HK, mod apicalinferior HK. Gr2 DD. Mod AS, mild MR, mod dil LA, PASP .   Moderate aortic stenosis    a. 01/2018 Echo: Mod AS, mean grad (S) , Valve area (VTI) 1.06 cm^2, (Vmax) 1.27 cm^2; b. 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   LIMA to LAD, SVG to D1, SVG to OM, EVH via right thigh and leg   Tobacco abuse    Type II diabetes mellitus (HCC)      Past Surgical History: Past Surgical History:  Procedure Laterality Date   ABDOMINAL AORTOGRAM N/A 03/04/2019   Procedure: ABDOMINAL AORTOGRAM;  Surgeon: Iran Ouch, MD;  Location: [facility] IN[facility];  Service: Cardiovascular;  Laterality: N/A;   AORTIC VALVE REPLACEMENT N/A 02/19/2018   Procedure: AORTIC VALVE REPLACEMENT (AVR);  Surgeon: Purcell Nails, MD;  Location: [facility] OR;  Service: Open Heart Surgery;  Laterality: N/A;   COLONOSCOPY     CORONARY ANGIOPLASTY WITH STENT PLACEMENT  ~  1998/1999   CORONARY ARTERY BYPASS GRAFT N/A 02/19/2018   Procedure: CORONARY ARTERY BYPASS GRAFTING (CABG) x three , using left internal mammary artery and right leg greater saphenous vein harvested endoscopically;  Surgeon: Rexene Alberts, MD;  Location: [facility];  Service: Open Heart Surgery;  Laterality: N/A;   ESOPHAGOGASTRODUODENOSCOPY (EGD) WITH PROPOFOL N/A 03/18/2018   Procedure: ESOPHAGOGASTRODUODENOSCOPY (EGD) WITH PROPOFOL;  Surgeon: Lucilla Lame, MD;  Location: [facility] ENDOSCOPY;  Service: Endoscopy;  Laterality: N/A;   LEFT HEART CATH AND CORONARY ANGIOGRAPHY N/A 02/06/2018   Procedure:  LEFT HEART CATH AND CORONARY ANGIOGRAPHY;  Surgeon: Wellington Hampshire, MD;  Location: [place] CV LAB;  Service: Cardiovascular;  Laterality: N/A;   LOWER EXTREMITY ANGIOGRAPHY Bilateral 03/04/2019   Procedure: Lower Extremity Angiography;  Surgeon: Wellington Hampshire, MD;  Location: [place] CV LAB;  Service: Cardiovascular;  Laterality: Bilateral;   PERIPHERAL VASCULAR ATHERECTOMY Left 03/04/2019   Procedure: PERIPHERAL VASCULAR ATHERECTOMY;  Surgeon: Wellington Hampshire, MD;  Location: [place] CV LAB;  Service: Cardiovascular;  Laterality: Left;  SFA   TEE WITHOUT CARDIOVERSION N/A 02/19/2018   Procedure: TRANSESOPHAGEAL ECHOCARDIOGRAM (TEE);  Surgeon: Rexene Alberts, MD;  Location: [place];  Service: Open Heart Surgery;  Laterality: N/A;   TONSILLECTOMY       Family History: Family History  Problem Relation Age of Onset   Lymphoma Mother    Peripheral vascular disease Father      Social History: Social History   Socioeconomic History   Marital status: Widowed    Spouse name: Not on file   Number of children: Not on file   Years of education: Not on file   Highest education level: Not on file  Occupational History   Not on file  Tobacco Use   Smoking status: Former    Packs/day: 1.00    Years: 40.00    Pack years: 40.00    Types: Cigarettes    Quit date: 02/02/2018    Years since quitting: 3.7   Smokeless tobacco: Never   Tobacco comments:    Quit 01/2018 - on 05/01/2018 talked to L[place] about Relaspe concerns. He ststaes that he has no taste for tobacco since he quit in Feb.  Vaping Use   Vaping Use: Never used  Substance and Sexual Activity   Alcohol use: Yes    Comment: occ   Drug use: No   Sexual activity: Yes  Other Topics Concern   Not on file  Social History Narrative   Lives in [place] by himself.  Retired Engineer, structural currently working as Patent examiner.  Fairly active but doesn't routinely exercise.   Social Determinants of Health    Financial Resource Strain: Not on file  Food Insecurity: Not on file  Transportation Needs: Not on file  Physical Activity: Not on file  Stress: Not on file  Social Connections: Not on file  Intimate Partner Violence: Not on file     Review of Systems: Review of Systems  Constitutional: Negative.   HENT: Negative.    Eyes: Negative.   Respiratory:  Positive for cough, shortness of breath and wheezing. Negative for hemoptysis and sputum production.   Cardiovascular:  Negative for chest pain, palpitations, orthopnea, claudication, leg swelling and PND.  Gastrointestinal: Negative.   Genitourinary:  Negative for dysuria, flank pain, frequency, hematuria and urgency.  Musculoskeletal:  Negative for back pain, falls, joint pain, myalgias and neck pain.  Skin: Negative.   Neurological: Negative.   Endo/Heme/Allergies:  Negative.   Psychiatric/Behavioral: Negative.     Vital Signs: Blood pressure (!) 125/41, pulse 64, temperature (!) 97 F (36.1 C), temperature source Oral, resp. rate (!) 21, height 5\' 5"  (1.651 m), weight 79.8 kg, SpO2 97 %.  Weight trends: Filed Weights   10/31/21 1331 11/01/21 0129  Weight: 74.8 kg 79.8 kg    Physical Exam: General: NAD, sitting in chair  Head: Normocephalic, atraumatic. Moist oral mucosal membranes  Eyes: Anicteric, PERRL  Neck: Supple, trachea midline  Lungs:  +wheezing  Heart: Regular rate and rhythm  Abdomen:  Soft, nontender  Extremities:  No peripheral edema.  Neurologic: Nonfocal, moving all four extremities  Skin: No lesions         Lab results: Basic Metabolic Panel: Recent Labs  Lab 10/31/21 1435 11/01/21 0001 11/01/21 0258  NA 132* 132* 133*  K 7.2* 6.1* 6.0*  CL 105 105 103  CO2 15* 17* 22  GLUCOSE 163* 227* 244*  BUN 70* 76* 72*  CREATININE 2.50* 2.29* 2.00*  CALCIUM 8.8* 8.5* 8.0*  MG  --   --  2.2  PHOS  --   --  3.5    Liver Function Tests: Recent Labs  Lab 10/31/21 1435  AST 26  ALT 58*   ALKPHOS 112  BILITOT 1.0  PROT 7.1  ALBUMIN 3.9   No results for input(s): LIPASE, AMYLASE in the last 168 hours. No results for input(s): AMMONIA in the last 168 hours.  CBC: Recent Labs  Lab 10/31/21 1335 11/01/21 0258  WBC 9.6 6.7  NEUTROABS 6.6  --   HGB 17.4* 15.1  HCT 51.8 43.4  MCV 91.8 89.5  PLT 235 171    Cardiac Enzymes: No results for input(s): CKTOTAL, CKMB, CKMBINDEX, TROPONINI in the last 168 hours.  BNP: Invalid input(s): POCBNP  CBG: Recent Labs  Lab 11/01/21 0807  GLUCAP 277*    Microbiology: Results for orders placed or performed during the hospital encounter of 10/31/21  Resp Panel by RT-PCR (Flu A&B, Covid) Nasopharyngeal Swab     Status: Abnormal   Collection Time: 10/31/21  2:39 PM   Specimen: Nasopharyngeal Swab; Nasopharyngeal(NP) swabs in vial transport medium  Result Value Ref Range Status   SARS Coronavirus 2 by RT PCR POSITIVE (A) NEGATIVE Corrected    Comment: READ BACK AND VERIFIED BY STEPHANIE RUDD, RN AT [id number] 10/31/21 BY [facility] RESULT CALLED TO, READ BACK BY AND VERIFIED WITH: (NOTE) SARS-CoV-2 target nucleic acids are DETECTED.  The SARS-CoV-2 RNA is generally detectable in upper respiratory specimens during the acute phase of infection. Positive results are indicative of the presence of the identified virus, but do not rule out bacterial infection or co-infection with other pathogens not detected by the test. Clinical correlation with patient history and other diagnostic information is necessary to determine patient infection status. The expected result is Negative.  Fact Sheet for Patients: EntrepreneurPulse.com.au  Fact Sheet for Healthcare Providers: IncredibleEmployment.be  This test is not yet approved or cleared by the [place] FDA and  has been authorized for detection and/or diagnosis of SARS-CoV-2 by FDA under an Emergency Use Authorization (EUA).  This EUA will remain  in  effect (meaning this test can be used) for the duration of  the COVID-19 declaration under Section 564(b)(1) of the Act, 21 U.S.C. section 360bbb-3(b)(1), unless the authorization is terminated or revoked sooner.  CORRECTED ON 11/16 AT 0800: PREVIOUSLY REPORTED AS POSITIVE READ BACK AND VERIFIED BY Thereasa Solo, RN AT [place] 10/31/21 BY [facility]  Influenza A by PCR NEGATIVE NEGATIVE Final   Influenza B by PCR NEGATIVE NEGATIVE Final    Comment: (NOTE) The Xpert Xpress SARS-CoV-2/FLU/RSV plus assay is intended as an aid in the diagnosis of influenza from Nasopharyngeal swab specimens and should not be used as a sole basis for treatment. Nasal washings and aspirates are unacceptable for Xpert Xpress SARS-CoV-2/FLU/RSV testing.  Fact Sheet for Patients: EntrepreneurPulse.com.au  Fact Sheet for Healthcare Providers: IncredibleEmployment.be  This test is not yet approved or cleared by the [place] FDA and has been authorized for detection and/or diagnosis of SARS-CoV-2 by FDA under an Emergency Use Authorization (EUA). This EUA will remain in effect (meaning this test can be used) for the duration of the COVID-19 declaration under Section 564(b)(1) of the Act, 21 U.S.C. section 360bbb-3(b)(1), unless the authorization is terminated or revoked.  Performed at [facility], [address] [place], [place] [address]     Coagulation Studies: Recent Labs    10/31/21 March 31, 2102  LABPROT 14.8  INR 1.2    Urinalysis: No results for input(s): COLORURINE, LABSPEC, PHURINE, GLUCOSEU, HGBUR, BILIRUBINUR, KETONESUR, PROTEINUR, UROBILINOGEN, NITRITE, LEUKOCYTESUR in the last 72 hours.  Invalid input(s): APPERANCEUR    Imaging: DG Chest 2 View  Result Date: 10/31/2021 CLINICAL DATA:  Shortness of breath EXAM: CHEST - 2 VIEW COMPARISON:  10/04/2021 FINDINGS: Previous median sternotomy and CABG. Previous aortic valve replacement. Heart size  upper limits of normal. Chronic aortic atherosclerotic calcification. Mild bronchial thickening but no evidence of infiltrate, collapse or effusion. No significant bone finding. IMPRESSION: Borderline cardiomegaly. Previous CABG and AVR. Bronchial thickening. No consolidation or collapse. Electronically Signed   By: Nelson Chimes M.D.   On: 10/31/2021 14:18   US RENAL  Result Date: 10/31/2021 CLINICAL DATA:  Acute renal injury. EXAM: RENAL / URINARY TRACT ULTRASOUND COMPLETE COMPARISON:  None. FINDINGS: Right Kidney: Renal measurements: 12.1 cm x 6.6 cm x 6.3 cm = volume: 281 mL. Diffusely increased echogenicity of the renal parenchyma is noted. A 6.3 cm x 5.2 cm x 5.3 cm anechoic structure is seen within the upper pole of the right kidney. No abnormal flow is seen within this region on color Doppler evaluation. No hydronephrosis is visualized. Left Kidney: Renal measurements: 12.9 cm x 4.9 cm x 5.3 cm = volume: 174 mL. Diffusely increased echogenicity of the renal parenchyma is noted. A 1.3 cm x 1.5 cm x 1.5 cm anechoic structure is seen within the upper pole of the left kidney. A similar appearing 4.1 cm x 3.5 cm x 3.2 cm anechoic structure is seen within the lower pole of the left kidney. No abnormal flow is seen within these regions on color Doppler evaluation. No hydronephrosis is visualized. Bladder: Appears normal for degree of bladder distention. Other: None. IMPRESSION: 1. Diffusely increased echogenicity of the renal parenchyma, likely secondary to medical renal disease. 2. Bilateral simple renal cysts. Electronically Signed   By: Virgina Norfolk M.D.   On: 10/31/2021 19:58   US Venous Img Lower Bilateral (DVT)  Result Date: 10/31/2021 CLINICAL DATA:  Acute hypercapnic respiratory failure EXAM: BILATERAL LOWER EXTREMITY VENOUS DOPPLER ULTRASOUND TECHNIQUE: Gray-scale sonography with graded compression, as well as color Doppler and duplex ultrasound were performed to evaluate the lower extremity  deep venous systems from the level of the common femoral vein and including the common femoral, femoral, profunda femoral, popliteal and calf veins including the posterior tibial, peroneal and gastrocnemius veins when visible. The superficial great saphenous vein was also interrogated. Spectral Doppler  was utilized to evaluate flow at rest and with distal augmentation maneuvers in the common femoral, femoral and popliteal veins. COMPARISON:  None. FINDINGS: RIGHT LOWER EXTREMITY Common Femoral Vein: No evidence of thrombus. Normal compressibility, respiratory phasicity and response to augmentation. Saphenofemoral Junction: No evidence of thrombus. Normal compressibility and flow on color Doppler imaging. Profunda Femoral Vein: No evidence of thrombus. Normal compressibility and flow on color Doppler imaging. Femoral Vein: No evidence of thrombus. Normal compressibility, respiratory phasicity and response to augmentation. Popliteal Vein: No evidence of thrombus. Normal compressibility, respiratory phasicity and response to augmentation. Calf Veins: No evidence of thrombus. Normal compressibility and flow on color Doppler imaging. Superficial Great Saphenous Vein: No evidence of thrombus. Normal compressibility. Venous Reflux:  None. Other Findings:  None. LEFT LOWER EXTREMITY Common Femoral Vein: No evidence of thrombus. Normal compressibility, respiratory phasicity and response to augmentation. Saphenofemoral Junction: No evidence of thrombus. Normal compressibility and flow on color Doppler imaging. Profunda Femoral Vein: No evidence of thrombus. Normal compressibility and flow on color Doppler imaging. Femoral Vein: No evidence of thrombus. Normal compressibility, respiratory phasicity and response to augmentation. Popliteal Vein: No evidence of thrombus. Normal compressibility, respiratory phasicity and response to augmentation. Calf Veins: No evidence of thrombus. Normal compressibility and flow on color Doppler  imaging. Superficial Great Saphenous Vein: No evidence of thrombus. Normal compressibility. Venous Reflux:  None. Other Findings:  None. IMPRESSION: No evidence of deep venous thrombosis in either lower extremity. Electronically Signed   By: Aram Candela M.D.   On: 10/31/2021 19:55   DG Chest Port 1 View  Result Date: 11/01/2021 CLINICAL DATA:  COVID-19 EXAM: PORTABLE CHEST 1 VIEW COMPARISON:  Chest radiograph 10/31/2021 FINDINGS: Median sternotomy wires, mediastinal surgical clips, and aortic valve prosthesis are again noted. The heart is enlarged, unchanged. The mediastinal contours are stable. Mild central peribronchial thickening is again seen. There is no new or worsening focal airspace disease. There is mild left basilar atelectasis. There is no pleural effusion. There is no pneumothorax. The bones are stable. IMPRESSION: Unchanged central peribronchial thickening which may reflect viral infection. Overall, no significant interval change compared to the study from 1 day prior. Electronically Signed   By: Lesia Hausen M.D.   On: 11/01/2021 08:02     Assessment & Plan: Harold Parker is a 75 y.o. white male with coronary artery disease status post CABG, COPD, history of GI bleed, hypertension, hyperlipidemia, aortic valve replacement, diabetes mellitus type II who was admitted to [facility] on 10/31/2021 for Hyperkalemia [E87.5] AKI (acute kidney injury) (HCC) [N17.9] Acute respiratory failure, unspecified whether with hypoxia or hypercapnia (HCC) [J96.00] Acute renal failure, unspecified acute renal failure type (HCC) [N17.9] Acute hypercapnic respiratory failure (HCC) [J96.02] COVID-19 [U07.1]   Acute kidney injury  Hyperkalemia Metabolic acidosis (acute) Hyponatremia  Baseline creatinine of 1.09 with GFR > 60 on 02/09/21. Patient denies every having history of kidney failure. Patient's father was on dialysis for "poor circulation" before he passed.  Most likely patient has ATN from  hypoxia.  However etiology is still unclear. Renal ultrasound without obstruction. No IV contrast exposure. No literature of molnupiravir causing kidney injury.  No acute indication for dialysis.  - Change to low potassium diet - Holding losartan, furosemide, metformin.  - Check urine studies - hep status and SPEP/UPEP - Continue IV bicarb gtt - Continue to monitor kidney function, serum electrolytes and urine output.   Thank you for allowing me to participate in patient's care.     LOS: 1  Vint Pola 11/16/20228:53 AM

## 2021-11-01 NOTE — Progress Notes (Signed)
ANTICOAGULATION CONSULT NOTE  Pharmacy Consult for heparin infusion Indication: pulmonary embolus  Allergies  Allergen Reactions   Atorvastatin Other (See Comments)    Leg aches and weakness     Patient Measurements: Height: 5\' 5"  (165.1 cm) Weight: 79.8 kg (175 lb 14.8 oz) IBW/kg (Calculated) : 61.5 Heparin Dosing Weight: 74.8 kg  Vital Signs: Temp: 97 F (36.1 C) (11/16 0000) Temp Source: Axillary (11/16 0000) BP: 119/94 (11/16 0200) Pulse Rate: 70 (11/16 0200)  Labs: Recent Labs    10/31/21 1335 10/31/21 1435 10/31/21 2103 11/01/21 0001 11/01/21 0258  HGB 17.4*  --   --   --  15.1  HCT 51.8  --   --   --  43.4  PLT 235  --   --   --  171  APTT  --   --  42*  --   --   LABPROT  --   --  14.8  --   --   INR  --   --  1.2  --   --   HEPARINUNFRC  --   --   --   --  0.54  CREATININE  --  2.50*  --  2.29* 2.00*  TROPONINIHS  --  180*  --   --   --      Estimated Creatinine Clearance: 30.1 mL/min (A) (by C-G formula based on SCr of 2 mg/dL (H)).   Medical History: Past Medical History:  Diagnosis Date   Bilateral carotid bruits    a. 01/2018 U/S: < 50% bilat ICA stenosis.   CAD (coronary artery disease)    a. 1998 s/p MI and BMS [place], [place]); b. 1999 redo PCI/rotablator in setting of what sounds like ISR;  c. Multiple stress tests over the years - last ~ 2017, reportedly nl; d. 01/2018 NSTEMI/Cath: LM 16m/d, LAD 50p, 40p/m, D1 60ost, OM1 95, RCA 100ost/p w/ L->R collats, EF 45%; e. s/p 3V CABG 02/19/18 (LIMA-LAD, VG-D1, VG-OM)   Chronic lower back pain    COPD (chronic obstructive pulmonary disease) (HCC)    GIB (gastrointestinal bleeding)    a. 02/2018 GIB and anemia w/ Hgb of 4.7 on presentation; b. 03/2018 EGD: 2 nonbleeding duodenal ulcers.   HTN (hypertension)    Hypercholesteremia    Ischemic cardiomyopathy    a. 01/2018 Echo: EF 40-45%, mid-apicalanteroseptal, ant, apical sev HK, mod apicalinferior HK. Gr2 DD. Mod AS, mild MR, mod dil LA, PASP 02/2018.    Moderate aortic stenosis    a. 01/2018 Echo: Mod AS, mean grad (S) 02/2018, Valve area (VTI) 1.06 cm^2, (Vmax) 1.27 cm^2; b. 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 male with past medical history of hypertension, hyperlipidemia, CAD, CHF, diabetes, and COPD on 4 L who presents to the ED complaining of shortness of breath. Pharmacy has been consulted for heparin dosing/monitoring.   Baseline labs: Hgb 17.4, Hct and Plt WNL; aPTT, PT-INR pending  Goal of Therapy:  Heparin level 0.3-0.7 units/ml Monitor platelets by anticoagulation protocol: Yes  1116  0258 HL 0.54, therapeutic x 1   Plan:  Continue heparin infusion at 1300 units/hr Recheck HL in 8 hrs to confirm Continue to monitor H&H and platelets  Otelia Sergeant, PharmD, [facility] 11/01/2021 3:59 AM

## 2021-11-01 NOTE — Progress Notes (Signed)
eLink Physician-Brief Progress Note Patient Name: Coran Dipaola DOB: 11/18/1946 MRN: [id number]   Date of Service  11/01/2021  HPI/Events of Note  Patient admitted with acute on chronic respiratory failure in the context of recent Covid 19 re-infection and a long trip by road from [place], CXR negative for active pulmonary infiltrates, work up is in progress.  eICU Interventions  New Patient Evaluation.        Thomasene Lot Serenna Deroy 11/01/2021, 12:04 AM

## 2021-11-01 NOTE — Progress Notes (Signed)
ANTICOAGULATION CONSULT NOTE  Pharmacy Consult for heparin infusion Indication: pulmonary embolus (suspected, not confirmed)  Patient Measurements: Height: 5\' 5"  (165.1 cm) Weight: 79.8 kg (175 lb 14.8 oz) IBW/kg (Calculated) : 61.5 Heparin Dosing Weight: 74.8 kg  Labs: Recent Labs    10/31/21 1335 10/31/21 1435 10/31/21 2103 11/01/21 0001 11/01/21 0258 11/01/21 0913  HGB 17.4*  --   --   --  15.1  --   HCT 51.8  --   --   --  43.4  --   PLT 235  --   --   --  171  --   APTT  --   --  42*  --   --   --   LABPROT  --   --  14.8  --   --   --   INR  --   --  1.2  --   --   --   HEPARINUNFRC  --   --   --   --  0.54 0.55  CREATININE  --  2.50*  --  2.29* 2.00*  --   CKTOTAL  --   --   --   --   --  69  TROPONINIHS  --  180*  --   --   --   --      Estimated Creatinine Clearance: 30.1 mL/min (A) (by C-G formula based on SCr of 2 mg/dL (H)).   Medical History: Past Medical History:  Diagnosis Date   Bilateral carotid bruits    a. 01/2018 U/S: < 50% bilat ICA stenosis.   CAD (coronary artery disease)    a. 1998 s/p MI and BMS [place], [place]); b. 1999 redo PCI/rotablator in setting of what sounds like ISR;  c. Multiple stress tests over the years - last ~ 2017, reportedly nl; d. 01/2018 NSTEMI/Cath: LM 11m/d, LAD 50p, 40p/m, D1 60ost, OM1 95, RCA 100ost/p w/ L->R collats, EF 45%; e. s/p 3V CABG 02/19/18 (LIMA-LAD, VG-D1, VG-OM)   Chronic lower back pain    COPD (chronic obstructive pulmonary disease) (HCC)    GIB (gastrointestinal bleeding)    a. 02/2018 GIB and anemia w/ Hgb of 4.7 on presentation; b. 03/2018 EGD: 2 nonbleeding duodenal ulcers.   HTN (hypertension)    Hypercholesteremia    Ischemic cardiomyopathy    a. 01/2018 Echo: EF 40-45%, mid-apicalanteroseptal, ant, apical sev HK, mod apicalinferior HK. Gr2 DD. Mod AS, mild MR, mod dil LA, PASP 02/2018.   Moderate aortic stenosis    a. 01/2018 Echo: Mod AS, mean grad (S) 02/2018, Valve area (VTI) 1.06 cm^2, (Vmax) 1.27  cm^2; b. 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 male with past medical history of hypertension, hyperlipidemia, CAD, CHF, diabetes, and COPD on 4 L who presents to the ED complaining of shortness of breath. Pharmacy has been consulted for heparin dosing/monitoring.   Baseline labs: Hgb 17.4, Plt 235, aPTT 42s, INR 1.2, D-dimer 5.17  11/16: Per PCCM rounds: LE 12/16 was negative for DVT. ECHO is  pending. Based on ECHO findings, consideration will be given to VQ scan.  Goal of Therapy:  Heparin level 0.3-0.7 units/ml Monitor platelets by anticoagulation protocol: Yes   Plan:  Heparin level is therapeutic x 2 Continue heparin infusion at 1300 units/hr Recheck HL tomorrow AM Daily CBC per protocol while on IV heparin  Tressie Ellis  11/01/2021 10:46 AM

## 2021-11-02 ENCOUNTER — Other Ambulatory Visit: Payer: Self-pay | Admitting: Radiology

## 2021-11-02 ENCOUNTER — Inpatient Hospital Stay: Payer: Medicare Other

## 2021-11-02 DIAGNOSIS — E8721 Acute metabolic acidosis: Secondary | ICD-10-CM

## 2021-11-02 DIAGNOSIS — U071 COVID-19: Secondary | ICD-10-CM | POA: Diagnosis not present

## 2021-11-02 LAB — KAPPA/LAMBDA LIGHT CHAINS
Kappa free light chain: 44 mg/L — ABNORMAL HIGH (ref 3.3–19.4)
Kappa, lambda light chain ratio: 1.8 — ABNORMAL HIGH (ref 0.26–1.65)
Lambda free light chains: 24.5 mg/L (ref 5.7–26.3)

## 2021-11-02 LAB — BASIC METABOLIC PANEL
Anion gap: 6 (ref 5–15)
BUN: 67 mg/dL — ABNORMAL HIGH (ref 8–23)
CO2: 24 mmol/L (ref 22–32)
Calcium: 8.5 mg/dL — ABNORMAL LOW (ref 8.9–10.3)
Chloride: 107 mmol/L (ref 98–111)
Creatinine, Ser: 1.36 mg/dL — ABNORMAL HIGH (ref 0.61–1.24)
GFR, Estimated: 54 mL/min — ABNORMAL LOW (ref >60)
Glucose, Bld: 292 mg/dL — ABNORMAL HIGH (ref 70–99)
Potassium: 5.2 mmol/L — ABNORMAL HIGH (ref 3.5–5.1)
Sodium: 137 mmol/L (ref 135–145)

## 2021-11-02 LAB — FERRITIN: Ferritin: 86 ng/mL (ref 24–336)

## 2021-11-02 LAB — CBC
HCT: 42.8 % (ref 39.0–52.0)
Hemoglobin: 15 g/dL (ref 13.0–17.0)
MCH: 31.1 pg (ref 26.0–34.0)
MCHC: 35 g/dL (ref 30.0–36.0)
MCV: 88.8 fL (ref 80.0–100.0)
Platelets: 198 10*3/uL (ref 150–400)
RBC: 4.82 MIL/uL (ref 4.22–5.81)
RDW: 13.7 % (ref 11.5–15.5)
WBC: 9.6 10*3/uL (ref 4.0–10.5)
nRBC: 0 % (ref 0.0–0.2)

## 2021-11-02 LAB — GLUCOSE, CAPILLARY
Glucose-Capillary: 256 mg/dL — ABNORMAL HIGH (ref 70–99)
Glucose-Capillary: 282 mg/dL — ABNORMAL HIGH (ref 70–99)
Glucose-Capillary: 234 mg/dL — ABNORMAL HIGH (ref 70–99)
Glucose-Capillary: 302 mg/dL — ABNORMAL HIGH (ref 70–99)
Glucose-Capillary: 291 mg/dL — ABNORMAL HIGH (ref 70–99)

## 2021-11-02 LAB — D-DIMER, QUANTITATIVE: D-Dimer, Quant: 0.93 ug/mL-FEU — ABNORMAL HIGH (ref 0.00–0.50)

## 2021-11-02 LAB — C-REACTIVE PROTEIN: CRP: 1.1 mg/dL — ABNORMAL HIGH (ref <1.0)

## 2021-11-02 LAB — HEPARIN LEVEL (UNFRACTIONATED): Heparin Unfractionated: 0.6 IU/mL (ref 0.30–0.70)

## 2021-11-02 MED ORDER — INSULIN ASPART 100 UNIT/ML IJ SOLN
4.0000 [IU] | Freq: Three times a day (TID) | INTRAMUSCULAR | Status: DC
  Administered 2021-11-02 (×2): 4 [IU] via SUBCUTANEOUS
  Filled 2021-11-02 (×2): qty 1

## 2021-11-02 MED ORDER — INSULIN GLARGINE-YFGN 100 UNIT/ML ~~LOC~~ SOLN
12.0000 [IU] | Freq: Every day | SUBCUTANEOUS | Status: DC
  Administered 2021-11-02: 13:00:00 12 [IU] via SUBCUTANEOUS
  Filled 2021-11-02 (×3): qty 0.12

## 2021-11-02 MED ORDER — TECHNETIUM TO 99M ALBUMIN AGGREGATED
4.0000 | Freq: Once | INTRAVENOUS | Status: AC | PRN
  Administered 2021-11-02: 12:00:00 4.45 via INTRAVENOUS

## 2021-11-02 MED ORDER — ENOXAPARIN SODIUM 40 MG/0.4ML IJ SOSY
40.0000 mg | PREFILLED_SYRINGE | Freq: Every day | INTRAMUSCULAR | Status: DC
  Administered 2021-11-02 – 2021-11-05 (×4): 40 mg via SUBCUTANEOUS
  Filled 2021-11-02 (×4): qty 0.4

## 2021-11-02 MED ORDER — ALPRAZOLAM 0.5 MG PO TABS
0.5000 mg | ORAL_TABLET | Freq: Once | ORAL | Status: AC
  Administered 2021-11-02: 19:00:00 0.5 mg via ORAL
  Filled 2021-11-02: qty 1

## 2021-11-02 NOTE — Progress Notes (Signed)
[facility]  ROUNDING NOTE   Subjective:   Patient states he is breathing better.  Creatinine 1.36 (2)  IV bicarb at 41mL/hr  UOP 1839mL  Objective:  Vital signs in last 24 hours:  Temp:  [96.4 F (35.8 C)-97.1 F (36.2 C)] 97 F (36.1 C) (11/17 0900) Pulse Rate:  [64-74] 72 (11/17 0900) Resp:  [17-30] 25 (11/17 0900) BP: (98-149)/(37-109) 98/68 (11/17 0900) SpO2:  [94 %-99 %] 99 % (11/17 0900) Weight:  [80.3 kg] 80.3 kg (11/17 0500)  Weight change: 5.456 kg Filed Weights   10/31/21 1331 11/01/21 0129 11/02/21 0500  Weight: 74.8 kg 79.8 kg 80.3 kg    Intake/Output: I/O last 3 completed shifts: In: 4953.5 [P.O.:1920; I.V.:3033.5] Out: 2325 [Urine:2325]   Intake/Output this shift:  Total I/O In: 362 [I.V.:362] Out: 400 [Urine:400]  Physical Exam: General: NAD, sitting in chair  Head: Normocephalic, atraumatic. Moist oral mucosal membranes  Eyes: Anicteric, PERRL  Neck: Supple, trachea midline  Lungs:  HFNC   Heart: Regular rate and rhythm  Abdomen:  Soft, nontender,   Extremities:  no peripheral edema.  Neurologic: Nonfocal, moving all four extremities  Skin: No lesions        Basic Metabolic Panel: Recent Labs  Lab 10/31/21 1435 11/01/21 0001 11/01/21 0258 11/02/21 0536  NA 132* 132* 133* 137  K 7.2* 6.1* 6.0* 5.2*  CL 105 105 103 107  CO2 15* 17* 22 24  GLUCOSE 163* 227* 244* 292*  BUN 70* 76* 72* 67*  CREATININE 2.50* 2.29* 2.00* 1.36*  CALCIUM 8.8* 8.5* 8.0* 8.5*  MG  --   --  2.2  --   PHOS  --   --  3.5  --     Liver Function Tests: Recent Labs  Lab 10/31/21 1435  AST 26  ALT 58*  ALKPHOS 112  BILITOT 1.0  PROT 7.1  ALBUMIN 3.9   No results for input(s): LIPASE, AMYLASE in the last 168 hours. No results for input(s): AMMONIA in the last 168 hours.  CBC: Recent Labs  Lab 10/31/21 1335 11/01/21 0258 11/02/21 0536  WBC 9.6 6.7 9.6  NEUTROABS 6.6  --   --   HGB 17.4* 15.1 15.0  HCT 51.8 43.4 42.8  MCV 91.8  89.5 88.8  PLT 235 171 198    Cardiac Enzymes: Recent Labs  Lab 11/01/21 0913  CKTOTAL 69    BNP: Invalid input(s): POCBNP  CBG: Recent Labs  Lab 11/01/21 0807 11/01/21 1147 11/01/21 1617 11/01/21 2101 11/02/21 0803  GLUCAP 277* 267* 220* 348* 256*    Microbiology: Results for orders placed or performed during the hospital encounter of 10/31/21  Resp Panel by RT-PCR (Flu A&B, Covid) Nasopharyngeal Swab     Status: Abnormal   Collection Time: 10/31/21  2:39 PM   Specimen: Nasopharyngeal Swab; Nasopharyngeal(NP) swabs in vial transport medium  Result Value Ref Range Status   SARS Coronavirus 2 by RT PCR POSITIVE (A) NEGATIVE Corrected    Comment: READ BACK AND VERIFIED BY STEPHANIE RUDD, RN AT 1606 10/31/21 BY [facility] RESULT CALLED TO, READ BACK BY AND VERIFIED WITH: (NOTE) SARS-CoV-2 target nucleic acids are DETECTED.  The SARS-CoV-2 RNA is generally detectable in upper respiratory specimens during the acute phase of infection. Positive results are indicative of the presence of the identified virus, but do not rule out bacterial infection or co-infection with other pathogens not detected by the test. Clinical correlation with patient history and other diagnostic information is necessary to determine patient  infection status. The expected result is Negative.  Fact Sheet for Patients: EntrepreneurPulse.com.au  Fact Sheet for Healthcare Providers: IncredibleEmployment.be  This test is not yet approved or cleared by the [place] FDA and  has been authorized for detection and/or diagnosis of SARS-CoV-2 by FDA under an Emergency Use Authorization (EUA).  This EUA will remain  in effect (meaning this test can be used) for the duration of  the COVID-19 declaration under Section 564(b)(1) of the Act, 21 U.S.C. section 360bbb-3(b)(1), unless the authorization is terminated or revoked sooner.  CORRECTED ON 11/16 AT 0800: PREVIOUSLY  REPORTED AS POSITIVE READ BACK AND VERIFIED BY STEPHANIE RUDD, RN AT [id number] 10/31/21 BY [facility]    Influenza A by PCR NEGATIVE NEGATIVE Final   Influenza B by PCR NEGATIVE NEGATIVE Final    Comment: (NOTE) The Xpert Xpress SARS-CoV-2/FLU/RSV plus assay is intended as an aid in the diagnosis of influenza from Nasopharyngeal swab specimens and should not be used as a sole basis for treatment. Nasal washings and aspirates are unacceptable for Xpert Xpress SARS-CoV-2/FLU/RSV testing.  Fact Sheet for Patients: EntrepreneurPulse.com.au  Fact Sheet for Healthcare Providers: IncredibleEmployment.be  This test is not yet approved or cleared by the [place] FDA and has been authorized for detection and/or diagnosis of SARS-CoV-2 by FDA under an Emergency Use Authorization (EUA). This EUA will remain in effect (meaning this test can be used) for the duration of the COVID-19 declaration under Section 564(b)(1) of the Act, 21 U.S.C. section 360bbb-3(b)(1), unless the authorization is terminated or revoked.  Performed at [facility], [address] [place], [place] [address]     Coagulation Studies: Recent Labs    10/31/21 04/05/2102  LABPROT 14.8  INR 1.2    Urinalysis: Recent Labs    11/01/21 0931  COLORURINE YELLOW*  LABSPEC 1.017  PHURINE 5.0  GLUCOSEU 150*  HGBUR NEGATIVE  BILIRUBINUR NEGATIVE  KETONESUR 5*  PROTEINUR 100*  NITRITE NEGATIVE  LEUKOCYTESUR NEGATIVE      Imaging: DG Chest 2 View  Result Date: 10/31/2021 CLINICAL DATA:  Shortness of breath EXAM: CHEST - 2 VIEW COMPARISON:  10/04/2021 FINDINGS: Previous median sternotomy and CABG. Previous aortic valve replacement. Heart size upper limits of normal. Chronic aortic atherosclerotic calcification. Mild bronchial thickening but no evidence of infiltrate, collapse or effusion. No significant bone finding. IMPRESSION: Borderline cardiomegaly. Previous CABG and AVR.  Bronchial thickening. No consolidation or collapse. Electronically Signed   By: Nelson Chimes M.D.   On: 10/31/2021 14:18   US RENAL  Result Date: 10/31/2021 CLINICAL DATA:  Acute renal injury. EXAM: RENAL / URINARY TRACT ULTRASOUND COMPLETE COMPARISON:  None. FINDINGS: Right Kidney: Renal measurements: 12.1 cm x 6.6 cm x 6.3 cm = volume: 281 mL. Diffusely increased echogenicity of the renal parenchyma is noted. A 6.3 cm x 5.2 cm x 5.3 cm anechoic structure is seen within the upper pole of the right kidney. No abnormal flow is seen within this region on color Doppler evaluation. No hydronephrosis is visualized. Left Kidney: Renal measurements: 12.9 cm x 4.9 cm x 5.3 cm = volume: 174 mL. Diffusely increased echogenicity of the renal parenchyma is noted. A 1.3 cm x 1.5 cm x 1.5 cm anechoic structure is seen within the upper pole of the left kidney. A similar appearing 4.1 cm x 3.5 cm x 3.2 cm anechoic structure is seen within the lower pole of the left kidney. No abnormal flow is seen within these regions on color Doppler evaluation. No hydronephrosis is visualized.  Bladder: Appears normal for degree of bladder distention. Other: None. IMPRESSION: 1. Diffusely increased echogenicity of the renal parenchyma, likely secondary to medical renal disease. 2. Bilateral simple renal cysts. Electronically Signed   By: Virgina Norfolk M.D.   On: 10/31/2021 19:58   US Venous Img Lower Bilateral (DVT)  Result Date: 10/31/2021 CLINICAL DATA:  Acute hypercapnic respiratory failure EXAM: BILATERAL LOWER EXTREMITY VENOUS DOPPLER ULTRASOUND TECHNIQUE: Gray-scale sonography with graded compression, as well as color Doppler and duplex ultrasound were performed to evaluate the lower extremity deep venous systems from the level of the common femoral vein and including the common femoral, femoral, profunda femoral, popliteal and calf veins including the posterior tibial, peroneal and gastrocnemius veins when visible. The  superficial great saphenous vein was also interrogated. Spectral Doppler was utilized to evaluate flow at rest and with distal augmentation maneuvers in the common femoral, femoral and popliteal veins. COMPARISON:  None. FINDINGS: RIGHT LOWER EXTREMITY Common Femoral Vein: No evidence of thrombus. Normal compressibility, respiratory phasicity and response to augmentation. Saphenofemoral Junction: No evidence of thrombus. Normal compressibility and flow on color Doppler imaging. Profunda Femoral Vein: No evidence of thrombus. Normal compressibility and flow on color Doppler imaging. Femoral Vein: No evidence of thrombus. Normal compressibility, respiratory phasicity and response to augmentation. Popliteal Vein: No evidence of thrombus. Normal compressibility, respiratory phasicity and response to augmentation. Calf Veins: No evidence of thrombus. Normal compressibility and flow on color Doppler imaging. Superficial Great Saphenous Vein: No evidence of thrombus. Normal compressibility. Venous Reflux:  None. Other Findings:  None. LEFT LOWER EXTREMITY Common Femoral Vein: No evidence of thrombus. Normal compressibility, respiratory phasicity and response to augmentation. Saphenofemoral Junction: No evidence of thrombus. Normal compressibility and flow on color Doppler imaging. Profunda Femoral Vein: No evidence of thrombus. Normal compressibility and flow on color Doppler imaging. Femoral Vein: No evidence of thrombus. Normal compressibility, respiratory phasicity and response to augmentation. Popliteal Vein: No evidence of thrombus. Normal compressibility, respiratory phasicity and response to augmentation. Calf Veins: No evidence of thrombus. Normal compressibility and flow on color Doppler imaging. Superficial Great Saphenous Vein: No evidence of thrombus. Normal compressibility. Venous Reflux:  None. Other Findings:  None. IMPRESSION: No evidence of deep venous thrombosis in either lower extremity. Electronically  Signed   By: Virgina Norfolk M.D.   On: 10/31/2021 19:55   DG Chest Port 1 View  Result Date: 11/02/2021 CLINICAL DATA:  Shortness of breath and hypoxia. Coronavirus infection. EXAM: PORTABLE CHEST 1 VIEW COMPARISON:  11/01/2021 FINDINGS: Previous median sternotomy, CABG and aortic valve replacement. Chronic cardiomegaly. Chronic interstitial lung markings without evidence of acute infiltrate, collapse or effusion. No significant bone finding. IMPRESSION: Previous CABG and AVR. Cardiomegaly. Chronic pulmonary markings but no sign of acute infiltrate or collapse. Electronically Signed   By: Nelson Chimes M.D.   On: 11/02/2021 09:48   DG Chest Port 1 View  Result Date: 11/01/2021 CLINICAL DATA:  COVID-19 EXAM: PORTABLE CHEST 1 VIEW COMPARISON:  Chest radiograph 10/31/2021 FINDINGS: Median sternotomy wires, mediastinal surgical clips, and aortic valve prosthesis are again noted. The heart is enlarged, unchanged. The mediastinal contours are stable. Mild central peribronchial thickening is again seen. There is no new or worsening focal airspace disease. There is mild left basilar atelectasis. There is no pleural effusion. There is no pneumothorax. The bones are stable. IMPRESSION: Unchanged central peribronchial thickening which may reflect viral infection. Overall, no significant interval change compared to the study from 1 day prior. Electronically Signed   By: Collier Salina  Noone M.D.   On: 11/01/2021 08:02   ECHOCARDIOGRAM COMPLETE  Result Date: 11/01/2021    ECHOCARDIOGRAM REPORT   Patient Name:   Harold Parker Date of Exam: 11/01/2021 Medical Rec #:  [id number]     Height:       65.0 in Accession #:    [id number]    Weight:       175.9 lb Date of Birth:  12-24-1945     BSA:          1.873 m Patient Age:    20 years      BP:           130/81 mmHg Patient Gender: M             HR:           70 bpm. Exam Location:  [facility] Procedure: 2D Echo, Cardiac Doppler and Color Doppler Indications:     Dyspnea R06.00   History:         Patient has prior history of Echocardiogram examinations, most                  recent 02/05/2021. CAD and Previous Myocardial Infarction; COPD.                  S/P Aortic valve replacement --bioprosthetic valve.  Sonographer:     Sherrie Sport Referring Phys:  Miner Diagnosing Phys: Kate Sable MD  Sonographer Comments: Technically difficult study due to poor echo windows, suboptimal apical window and suboptimal parasternal window. IMPRESSIONS  1. Left ventricular ejection fraction, by estimation, is 35 to 40%. The left ventricle has moderately decreased function. Left ventricular endocardial border not optimally defined to evaluate regional wall motion. There is mild left ventricular hypertrophy. Left ventricular diastolic parameters are consistent with Grade I diastolic dysfunction (impaired relaxation).  2. Right ventricular systolic function was not well visualized. The right ventricular size is moderately enlarged.  3. The mitral valve is normal in structure. No evidence of mitral valve regurgitation.  4. The aortic valve was not well visualized. Aortic valve regurgitation is not visualized. FINDINGS  Left Ventricle: Left ventricular ejection fraction, by estimation, is 35 to 40%. The left ventricle has moderately decreased function. Left ventricular endocardial border not optimally defined to evaluate regional wall motion. The left ventricular internal cavity size was normal in size. There is mild left ventricular hypertrophy. Left ventricular diastolic parameters are consistent with Grade I diastolic dysfunction (impaired relaxation). Right Ventricle: The right ventricular size is moderately enlarged. No increase in right ventricular wall thickness. Right ventricular systolic function was not well visualized. Left Atrium: Left atrial size was normal in size. Right Atrium: Right atrial size was not well visualized. Pericardium: There is no evidence of pericardial effusion.  Mitral Valve: The mitral valve is normal in structure. No evidence of mitral valve regurgitation. Tricuspid Valve: The tricuspid valve is not well visualized. Tricuspid valve regurgitation is not demonstrated. Aortic Valve: The aortic valve was not well visualized. Aortic valve regurgitation is not visualized. Aortic valve mean gradient measures 1.0 mmHg. Aortic valve peak gradient measures 2.3 mmHg. Aortic valve area, by VTI measures 2.85 cm. Pulmonic Valve: The pulmonic valve was not well visualized. Pulmonic valve regurgitation is not visualized. Aorta: The aortic root was not well visualized. Venous: The inferior vena cava was not well visualized. IAS/Shunts: No atrial level shunt detected by color flow Doppler.  LEFT VENTRICLE PLAX 2D LVIDd:  4.53 cm   Diastology LVIDs:         3.13 cm   LV e' medial:    5.44 cm/s LV PW:         1.05 cm   LV E/e' medial:  10.3 LV IVS:        1.30 cm   LV e' lateral:   6.96 cm/s LVOT diam:     2.00 cm   LV E/e' lateral: 8.1 LV SV:         37 LV SV Index:   20 LVOT Area:     3.14 cm  RIGHT VENTRICLE RV Basal diam:  4.83 cm RV S prime:     6.85 cm/s TAPSE (M-mode): 3.8 cm LEFT ATRIUM         Index LA diam:    4.30 cm 2.30 cm/m  AORTIC VALVE AV Area (Vmax):    2.29 cm AV Area (Vmean):   2.12 cm AV Area (VTI):     2.85 cm AV Vmax:           76.00 cm/s AV Vmean:          49.400 cm/s AV VTI:            0.129 m AV Peak Grad:      2.3 mmHg AV Mean Grad:      1.0 mmHg LVOT Vmax:         55.50 cm/s LVOT Vmean:        33.400 cm/s LVOT VTI:          0.117 m LVOT/AV VTI ratio: 0.91  AORTA Ao Root diam: 3.10 cm MITRAL VALVE               TRICUSPID VALVE MV Area (PHT): 3.61 cm    TR Peak grad:   41.7 mmHg MV Decel Time: 210 msec    TR Vmax:        323.00 cm/s MV E velocity: 56.10 cm/s MV A velocity: 78.00 cm/s  SHUNTS MV E/A ratio:  0.72        Systemic VTI:  0.12 m                            Systemic Diam: 2.00 cm Debbe Odea MD Electronically signed by Debbe Odea  MD Signature Date/Time: 11/01/2021/3:49:37 PM    Final      Medications:    heparin 1,300 Units/hr (11/02/21 1000)   sodium bicarbonate in 0.45 NS mL infusion 75 mL/hr at 11/02/21 1000    ALPRAZolam  0.5 mg Oral BID   aspirin EC  81 mg Oral Daily   busPIRone  10 mg Oral BID   Chlorhexidine Gluconate Cloth  6 each Topical Daily   doxycycline  100 mg Oral Q12H   fluticasone furoate-vilanterol  1 puff Inhalation Daily   And   umeclidinium bromide  1 puff Inhalation Daily   folic acid  500 mcg Oral QPM   insulin aspart  0-5 Units Subcutaneous QHS   insulin aspart  0-9 Units Subcutaneous TID WC   insulin aspart  4 Units Subcutaneous TID WC   insulin glargine-yfgn  12 Units Subcutaneous Daily   methylPREDNISolone (SOLU-MEDROL) injection  0.5 mg/kg Intravenous Q12H   Followed by   Melene Muller ON 11/04/2021] predniSONE  50 mg Oral Daily   metoprolol succinate  50 mg Oral Daily   pantoprazole  40 mg Oral Daily   albuterol, chlorpheniramine-HYDROcodone, docusate sodium,  guaiFENesin, guaiFENesin-dextromethorphan, ondansetron (ZOFRAN) IV, oxymetazoline, polyethylene glycol, traZODone  Assessment/ Plan:  Mr. Harold Parker is a 75 y.o. white male with coronary artery disease status post CABG, COPD, history of GI bleed, hypertension, hyperlipidemia, aortic valve replacement, diabetes mellitus type II who was admitted to [facility] on 10/31/2021 for Hyperkalemia [E87.5] AKI (acute kidney injury) ([place]) [N17.9] Acute respiratory failure, unspecified whether with hypoxia or hypercapnia ([place]) [J96.00] Acute renal failure, unspecified acute renal failure type ([place]) [N17.9] Acute hypercapnic respiratory failure (HCC) [J96.02] COVID-19 [U07.1]   Acute kidney injury  Hyperkalemia Metabolic acidosis (acute) Hyponatremia   Baseline creatinine of 1.09 with GFR > 60 on 02/09/21. Patient's kidney function has improved with IV fluids and PO intake.  Sodium now at goal.  Acute kidney injury most likely patient has  ATN from hypoxia.  Renal ultrasound without obstruction. No IV contrast exposure.   No acute indication for dialysis.  Urine studies reviewed.  - Continue low potassium diet - Continue to hold losartan, furosemide, metformin.  - Pending SPEP/UPEP - Continue IV bicarb gtt for another 24 hours.     LOS: 2 Saryah Loper 11/17/202210:36 AM

## 2021-11-02 NOTE — Progress Notes (Signed)
ANTICOAGULATION CONSULT NOTE  Pharmacy Consult for heparin infusion Indication: pulmonary embolus (suspected, not confirmed)  Patient Measurements: Height: 5\' 5"  (165.1 cm) Weight: 80.3 kg (177 lb 0.5 oz) IBW/kg (Calculated) : 61.5 Heparin Dosing Weight: 74.8 kg  Labs: Recent Labs    10/31/21 1335 10/31/21 1435 10/31/21 1435 10/31/21 2103 11/01/21 0001 11/01/21 0258 11/01/21 0913 11/02/21 0536  HGB 17.4*  --   --   --   --  15.1  --  15.0  HCT 51.8  --   --   --   --  43.4  --  42.8  PLT 235  --   --   --   --  171  --  198  APTT  --   --   --  42*  --   --   --   --   LABPROT  --   --   --  14.8  --   --   --   --   INR  --   --   --  1.2  --   --   --   --   HEPARINUNFRC  --   --   --   --   --  0.54 0.55 0.60  CREATININE  --  2.50*   < >  --  2.29* 2.00*  --  1.36*  CKTOTAL  --   --   --   --   --   --  69  --   TROPONINIHS  --  180*  --   --   --   --   --   --    < > = values in this interval not displayed.     Estimated Creatinine Clearance: 44.4 mL/min (A) (by C-G formula based on SCr of 1.36 mg/dL (H)).   Medical History: Past Medical History:  Diagnosis Date   Bilateral carotid bruits    a. 01/2018 U/S: < 50% bilat ICA stenosis.   CAD (coronary artery disease)    a. 1998 s/p MI and BMS [place], [place]); b. 1999 redo PCI/rotablator in setting of what sounds like ISR;  c. Multiple stress tests over the years - last ~ 2017, reportedly nl; d. 01/2018 NSTEMI/Cath: LM 59m/d, LAD 50p, 40p/m, D1 60ost, OM1 95, RCA 100ost/p w/ L->R collats, EF 45%; e. s/p 3V CABG 02/19/18 (LIMA-LAD, VG-D1, VG-OM)   Chronic lower back pain    COPD (chronic obstructive pulmonary disease) (HCC)    GIB (gastrointestinal bleeding)    a. 02/2018 GIB and anemia w/ Hgb of 4.7 on presentation; b. 03/2018 EGD: 2 nonbleeding duodenal ulcers.   HTN (hypertension)    Hypercholesteremia    Ischemic cardiomyopathy    a. 01/2018 Echo: EF 40-45%, mid-apicalanteroseptal, ant, apical sev HK, mod  apicalinferior HK. Gr2 DD. Mod AS, mild MR, mod dil LA, PASP 02/2018.   Moderate aortic stenosis    a. 01/2018 Echo: Mod AS, mean grad (S) 02/2018, Valve area (VTI) 1.06 cm^2, (Vmax) 1.27 cm^2; b. 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   Myocardial infarction [facility]) ~ 1998/1999   S/P aortic valve replacement with bioprosthetic valve 02/19/2018   a. 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   LIMA to LAD, SVG to D1, SVG to OM, EVH via right thigh and leg   Tobacco abuse    Type II diabetes mellitus (HCC)     Medications:  Per chart review, pt not on  anticoagulant PTA  Assessment: 75 y.o. male with past medical history of hypertension, hyperlipidemia, CAD, CHF, diabetes, and COPD on 4 L who presents to the ED complaining of shortness of breath. Pharmacy has been consulted for heparin dosing/monitoring.   Baseline labs: Hgb 17.4, Plt 235, aPTT 42s, INR 1.2, D-dimer 5.17  11/16: Per PCCM rounds: LE [place] was negative for DVT. ECHO is pending. Based on ECHO findings, consideration will be given to VQ scan.  Goal of Therapy:  Heparin level 0.3-0.7 units/ml Monitor platelets by anticoagulation protocol: Yes   Plan:  Heparin level is therapeutic x 3 Continue heparin infusion at 1300 units/hr Recheck HL tomorrow AM Daily CBC per protocol while on IV heparin  Otelia Sergeant, PharmD, [facility] 11/02/2021 7:16 AM

## 2021-11-02 NOTE — Progress Notes (Signed)
PROGRESS NOTE    Harold Parker   [id number]  DOB: Aug 14, 1946  PCP: Lajean Manes, MD    DOA: 10/31/2021 LOS: 2    Brief Narrative / Hospital Course to Date:   75 year old male with a past medical history significant for COPD and severe COVID-19 pneumonia who has chronic respiratory failure with hypoxia on 4 Lmin at baseline.  He presented to [facility] ED on 10/31/21  with of generalized weakness, shortness of breath, worsening hypoxia, and a recent positive COVID-19 home test after a trip to [place].    He also reported body aches, fevers/chills and increased cough with occasional production of green mucus, poor appetite but drinking plenty of fluids including "Body Armour" drinks for electrolytes.  He called Dr. Patsey Berthold, his pulmonologist, who called in a prescription of molnupiravir.    He was admitted to ICU due to worsening hypoxemic respiratory failure and acute kidney injury with hyperkalemia.  Transferred to [facility] service on 11/02/21.  Assessment & Plan   Active Problems:   COVID-19   Pressure injury of skin   Acute metabolic acidosis   AKI, Hyperkalemia, Metabolic Acidosis - Improving. 11/17: Cr 1.36, K 5.2, on bicarb gtt --Nephrology following, see recs --follow BMP's  Acute on chronic hypoxic respiratory failure due to Acute Covid-19 Infection with history of Severe Covid-19 earlier this year.  On 3-4 L/min O2 at baseline. --Doxycycline x 5 days --Solu-medrol >> prednisone on 11/19 --Supplement O2 to keep sats >90%, back on baseline 4 L --Continue Brovana, Pulmicort, Yupelri --PRN albuterol  Pulmonary embolism RULED OUT - Echo showed RV strain, subsequent V/Q scan 11/17 low risk for PE and patient's oxygenation returned to baseline. LE doppler negative for DVT --transition off heparin gtt --VTE ppx Lovenox  Elevated troponin due to demand ischemia - due to above. --telemetry  CAD, PAD, Hx of CABG Aortic stenosis, s/p bovine valve replacement -   --metoprolol, ASA --telemetry    Patient BMI: Body mass index is 29.46 kg/m.   DVT prophylaxis: enoxaparin (LOVENOX) injection 40 mg Start: 11/02/21 2200   Diet:  Diet Orders (From admission, onward)     Start     Ordered   11/01/21 0911  Diet renal with fluid restriction Fluid restriction: 1500 mL Fluid; Room service appropriate? Yes; Fluid consistency: Thin  Diet effective now       Question Answer Comment  Fluid restriction: 1500 mL Fluid   Room service appropriate? Yes   Fluid consistency: Thin      11/01/21 0910              Code Status: Full Code   Subjective 11/02/21    Pt seen in ICU, up in recliner.  Reports feeling well.  No SOB, CP, or other complaints.  Hopes to go home soon.   Says he's still riding his motorcycle, determined not to let anything slow him down.   Disposition Plan & Communication   Status is: Inpatient  Remains inpatient appropriate because: remains on IV steroids, severity of illness.      Consults, Procedures, Significant Events   Consultants:  PCCM Nephrology  Procedures:  Echo  Antimicrobials:  Anti-infectives (From admission, onward)    Start     Dose/Rate Route Frequency Ordered Stop   10/31/21 2200  doxycycline (VIBRA-TABS) tablet 100 mg        100 mg Oral Every 12 hours 10/31/21 1750           Micro    Objective   Vitals:  11/02/21 1210 11/02/21 1530 11/02/21 1531 11/02/21 1832  BP:  (!) 146/127 (!) 146/127 139/63  Pulse:  69 70 64  Resp:  (!) 23 (!) 21 18  Temp:    98 F (36.7 C)  TempSrc:    Oral  SpO2: 96% 95% 96% 93%  Weight:      Height:        Intake/Output Summary (Last 24 hours) at 11/02/2021 1909 Last data filed at 11/02/2021 1907 Gross per 24 hour  Intake 1934.01 ml  Output 1250 ml  Net 684.01 ml   Filed Weights   10/31/21 1331 11/01/21 0129 11/02/21 0500  Weight: 74.8 kg 79.8 kg 80.3 kg    Physical Exam:  General exam: awake, alert, no acute distress, in good  spirits HEENT: atraumatic, clear conjunctiva, anicteric sclera, moist mucus membranes, hearing grossly normal  Respiratory system: CTAB but diminished, no wheezes, rales or rhonchi, normal respiratory effort, on 4 L/min [place] O2. Cardiovascular system: normal S1/S2, RRR, no pedal edema.   Central nervous system: A&O x4. no gross focal neurologic deficits, normal speech Extremities: moves all, no edema, normal tone Skin: dry, intact, normal temperature Psychiatry: normal mood, congruent affect, judgement and insight appear normal  Labs   Data Reviewed: I have personally reviewed following labs and imaging studies  CBC: Recent Labs  Lab 10/31/21 1335 11/01/21 0258 11/02/21 0536  WBC 9.6 6.7 9.6  NEUTROABS 6.6  --   --   HGB 17.4* 15.1 15.0  HCT 51.8 43.4 42.8  MCV 91.8 89.5 88.8  PLT 235 171 [id number]   Basic Metabolic Panel: Recent Labs  Lab 10/31/21 1435 11/01/21 0001 11/01/21 0258 11/02/21 0536  NA 132* 132* 133* 137  K 7.2* 6.1* 6.0* 5.2*  CL 105 105 103 107  CO2 15* 17* 22 24  GLUCOSE 163* 227* 244* 292*  BUN 70* 76* 72* 67*  CREATININE 2.50* 2.29* 2.00* 1.36*  CALCIUM 8.8* 8.5* 8.0* 8.5*  MG  --   --  2.2  --   PHOS  --   --  3.5  --    GFR: Estimated Creatinine Clearance: 44.4 mL/min (A) (by C-G formula based on SCr of 1.36 mg/dL (H)). Liver Function Tests: Recent Labs  Lab 10/31/21 1435  AST 26  ALT 58*  ALKPHOS 112  BILITOT 1.0  PROT 7.1  ALBUMIN 3.9   No results for input(s): LIPASE, AMYLASE in the last 168 hours. No results for input(s): AMMONIA in the last 168 hours. Coagulation Profile: Recent Labs  Lab 10/31/21 2103  INR 1.2   Cardiac Enzymes: Recent Labs  Lab 11/01/21 0913  CKTOTAL 69   BNP (last 3 results) No results for input(s): PROBNP in the last 8760 hours. HbA1C: Recent Labs    11/01/21 0001  HGBA1C 6.8*   CBG: Recent Labs  Lab 11/01/21 2101 11/02/21 0803 11/02/21 1107 11/02/21 1528 11/02/21 1852  GLUCAP 348* 256* 282*  234* 302*   Lipid Profile: No results for input(s): CHOL, HDL, LDLCALC, TRIG, CHOLHDL, LDLDIRECT in the last 72 hours. Thyroid Function Tests: No results for input(s): TSH, T4TOTAL, FREET4, T3FREE, THYROIDAB in the last 72 hours. Anemia Panel: Recent Labs    11/01/21 0258 11/02/21 0536  FERRITIN 108 86   Sepsis Labs: Recent Labs  Lab 10/31/21 2337  PROCALCITON <0.10    Recent Results (from the past 240 hour(s))  Resp Panel by RT-PCR (Flu A&B, Covid) Nasopharyngeal Swab     Status: Abnormal   Collection Time: 10/31/21  2:39  PM   Specimen: Nasopharyngeal Swab; Nasopharyngeal(NP) swabs in vial transport medium  Result Value Ref Range Status   SARS Coronavirus 2 by RT PCR POSITIVE (A) NEGATIVE Corrected    Comment: READ BACK AND VERIFIED BY STEPHANIE RUDD, RN AT 1606 10/31/21 BY [facility] RESULT CALLED TO, READ BACK BY AND VERIFIED WITH: (NOTE) SARS-CoV-2 target nucleic acids are DETECTED.  The SARS-CoV-2 RNA is generally detectable in upper respiratory specimens during the acute phase of infection. Positive results are indicative of the presence of the identified virus, but do not rule out bacterial infection or co-infection with other pathogens not detected by the test. Clinical correlation with patient history and other diagnostic information is necessary to determine patient infection status. The expected result is Negative.  Fact Sheet for Patients: EntrepreneurPulse.com.au  Fact Sheet for Healthcare Providers: IncredibleEmployment.be  This test is not yet approved or cleared by the [place] FDA and  has been authorized for detection and/or diagnosis of SARS-CoV-2 by FDA under an Emergency Use Authorization (EUA).  This EUA will remain  in effect (meaning this test can be used) for the duration of  the COVID-19 declaration under Section 564(b)(1) of the Act, 21 U.S.C. section 360bbb-3(b)(1), unless the authorization is terminated  or revoked sooner.  CORRECTED ON 11/16 AT 0800: PREVIOUSLY REPORTED AS POSITIVE READ BACK AND VERIFIED BY STEPHANIE RUDD, RN AT [id number] 10/31/21 BY [facility]    Influenza A by PCR NEGATIVE NEGATIVE Final   Influenza B by PCR NEGATIVE NEGATIVE Final    Comment: (NOTE) The Xpert Xpress SARS-CoV-2/FLU/RSV plus assay is intended as an aid in the diagnosis of influenza from Nasopharyngeal swab specimens and should not be used as a sole basis for treatment. Nasal washings and aspirates are unacceptable for Xpert Xpress SARS-CoV-2/FLU/RSV testing.  Fact Sheet for Patients: EntrepreneurPulse.com.au  Fact Sheet for Healthcare Providers: IncredibleEmployment.be  This test is not yet approved or cleared by the [place] FDA and has been authorized for detection and/or diagnosis of SARS-CoV-2 by FDA under an Emergency Use Authorization (EUA). This EUA will remain in effect (meaning this test can be used) for the duration of the COVID-19 declaration under Section 564(b)(1) of the Act, 21 U.S.C. section 360bbb-3(b)(1), unless the authorization is terminated or revoked.  Performed at [facility], [address] [place], [place] [address]       Imaging Studies   NM Pulmonary Perfusion  Result Date: 11/02/2021 CLINICAL DATA:  PE suspected EXAM: NUCLEAR MEDICINE PERFUSION LUNG SCAN TECHNIQUE: Perfusion images were obtained in multiple projections after intravenous injection of radiopharmaceutical. Ventilation scans intentionally deferred if perfusion scan and chest x-ray adequate for interpretation during COVID 19 epidemic. RADIOPHARMACEUTICALS:  4.5 mCi Tc-32m MAA IV COMPARISON:  Same-day chest radiographs FINDINGS: Cardiomegaly.  Normal, homogeneous perfusion of the lungs. IMPRESSION: 1. Very low probability examination for pulmonary embolism by modified perfusion only PIOPED criteria (PE absent). 2.  Cardiomegaly. Electronically Signed   By: Delanna Ahmadi M.D.   On: 11/02/2021 11:52   US RENAL  Result Date: 10/31/2021 CLINICAL DATA:  Acute renal injury. EXAM: RENAL / URINARY TRACT ULTRASOUND COMPLETE COMPARISON:  None. FINDINGS: Right Kidney: Renal measurements: 12.1 cm x 6.6 cm x 6.3 cm = volume: 281 mL. Diffusely increased echogenicity of the renal parenchyma is noted. A 6.3 cm x 5.2 cm x 5.3 cm anechoic structure is seen within the upper pole of the right kidney. No abnormal flow is seen within this region on color Doppler evaluation. No hydronephrosis is visualized.  Left Kidney: Renal measurements: 12.9 cm x 4.9 cm x 5.3 cm = volume: 174 mL. Diffusely increased echogenicity of the renal parenchyma is noted. A 1.3 cm x 1.5 cm x 1.5 cm anechoic structure is seen within the upper pole of the left kidney. A similar appearing 4.1 cm x 3.5 cm x 3.2 cm anechoic structure is seen within the lower pole of the left kidney. No abnormal flow is seen within these regions on color Doppler evaluation. No hydronephrosis is visualized. Bladder: Appears normal for degree of bladder distention. Other: None. IMPRESSION: 1. Diffusely increased echogenicity of the renal parenchyma, likely secondary to medical renal disease. 2. Bilateral simple renal cysts. Electronically Signed   By: Virgina Norfolk M.D.   On: 10/31/2021 19:58   US Venous Img Lower Bilateral (DVT)  Result Date: 10/31/2021 CLINICAL DATA:  Acute hypercapnic respiratory failure EXAM: BILATERAL LOWER EXTREMITY VENOUS DOPPLER ULTRASOUND TECHNIQUE: Gray-scale sonography with graded compression, as well as color Doppler and duplex ultrasound were performed to evaluate the lower extremity deep venous systems from the level of the common femoral vein and including the common femoral, femoral, profunda femoral, popliteal and calf veins including the posterior tibial, peroneal and gastrocnemius veins when visible. The superficial great saphenous vein was also interrogated. Spectral Doppler was utilized to  evaluate flow at rest and with distal augmentation maneuvers in the common femoral, femoral and popliteal veins. COMPARISON:  None. FINDINGS: RIGHT LOWER EXTREMITY Common Femoral Vein: No evidence of thrombus. Normal compressibility, respiratory phasicity and response to augmentation. Saphenofemoral Junction: No evidence of thrombus. Normal compressibility and flow on color Doppler imaging. Profunda Femoral Vein: No evidence of thrombus. Normal compressibility and flow on color Doppler imaging. Femoral Vein: No evidence of thrombus. Normal compressibility, respiratory phasicity and response to augmentation. Popliteal Vein: No evidence of thrombus. Normal compressibility, respiratory phasicity and response to augmentation. Calf Veins: No evidence of thrombus. Normal compressibility and flow on color Doppler imaging. Superficial Great Saphenous Vein: No evidence of thrombus. Normal compressibility. Venous Reflux:  None. Other Findings:  None. LEFT LOWER EXTREMITY Common Femoral Vein: No evidence of thrombus. Normal compressibility, respiratory phasicity and response to augmentation. Saphenofemoral Junction: No evidence of thrombus. Normal compressibility and flow on color Doppler imaging. Profunda Femoral Vein: No evidence of thrombus. Normal compressibility and flow on color Doppler imaging. Femoral Vein: No evidence of thrombus. Normal compressibility, respiratory phasicity and response to augmentation. Popliteal Vein: No evidence of thrombus. Normal compressibility, respiratory phasicity and response to augmentation. Calf Veins: No evidence of thrombus. Normal compressibility and flow on color Doppler imaging. Superficial Great Saphenous Vein: No evidence of thrombus. Normal compressibility. Venous Reflux:  None. Other Findings:  None. IMPRESSION: No evidence of deep venous thrombosis in either lower extremity. Electronically Signed   By: Virgina Norfolk M.D.   On: 10/31/2021 19:55   DG Chest Port 1  View  Result Date: 11/02/2021 CLINICAL DATA:  Shortness of breath and hypoxia. Coronavirus infection. EXAM: PORTABLE CHEST 1 VIEW COMPARISON:  11/01/2021 FINDINGS: Previous median sternotomy, CABG and aortic valve replacement. Chronic cardiomegaly. Chronic interstitial lung markings without evidence of acute infiltrate, collapse or effusion. No significant bone finding. IMPRESSION: Previous CABG and AVR. Cardiomegaly. Chronic pulmonary markings but no sign of acute infiltrate or collapse. Electronically Signed   By: Nelson Chimes M.D.   On: 11/02/2021 09:48   DG Chest Port 1 View  Result Date: 11/01/2021 CLINICAL DATA:  COVID-19 EXAM: PORTABLE CHEST 1 VIEW COMPARISON:  Chest radiograph 10/31/2021 FINDINGS: Median sternotomy wires,  mediastinal surgical clips, and aortic valve prosthesis are again noted. The heart is enlarged, unchanged. The mediastinal contours are stable. Mild central peribronchial thickening is again seen. There is no new or worsening focal airspace disease. There is mild left basilar atelectasis. There is no pleural effusion. There is no pneumothorax. The bones are stable. IMPRESSION: Unchanged central peribronchial thickening which may reflect viral infection. Overall, no significant interval change compared to the study from 1 day prior. Electronically Signed   By: Valetta Mole M.D.   On: 11/01/2021 08:02   ECHOCARDIOGRAM COMPLETE  Result Date: 11/01/2021    ECHOCARDIOGRAM REPORT   Patient Name:   ROBBIN STYLES Date of Exam: 11/01/2021 Medical Rec #:  [id number]     Height:       65.0 in Accession #:    [id number]    Weight:       175.9 lb Date of Birth:  03/10/1946     BSA:          1.873 m Patient Age:    1 years      BP:           130/81 mmHg Patient Gender: M             HR:           70 bpm. Exam Location:  [facility] Procedure: 2D Echo, Cardiac Doppler and Color Doppler Indications:     Dyspnea R06.00  History:         Patient has prior history of Echocardiogram examinations, most                   recent 02/05/2021. CAD and Previous Myocardial Infarction; COPD.                  S/P Aortic valve replacement --bioprosthetic valve.  Sonographer:     Sherrie Sport Referring Phys:  [place] Diagnosing Phys: Kate Sable MD  Sonographer Comments: Technically difficult study due to poor echo windows, suboptimal apical window and suboptimal parasternal window. IMPRESSIONS  1. Left ventricular ejection fraction, by estimation, is 35 to 40%. The left ventricle has moderately decreased function. Left ventricular endocardial border not optimally defined to evaluate regional wall motion. There is mild left ventricular hypertrophy. Left ventricular diastolic parameters are consistent with Grade I diastolic dysfunction (impaired relaxation).  2. Right ventricular systolic function was not well visualized. The right ventricular size is moderately enlarged.  3. The mitral valve is normal in structure. No evidence of mitral valve regurgitation.  4. The aortic valve was not well visualized. Aortic valve regurgitation is not visualized. FINDINGS  Left Ventricle: Left ventricular ejection fraction, by estimation, is 35 to 40%. The left ventricle has moderately decreased function. Left ventricular endocardial border not optimally defined to evaluate regional wall motion. The left ventricular internal cavity size was normal in size. There is mild left ventricular hypertrophy. Left ventricular diastolic parameters are consistent with Grade I diastolic dysfunction (impaired relaxation). Right Ventricle: The right ventricular size is moderately enlarged. No increase in right ventricular wall thickness. Right ventricular systolic function was not well visualized. Left Atrium: Left atrial size was normal in size. Right Atrium: Right atrial size was not well visualized. Pericardium: There is no evidence of pericardial effusion. Mitral Valve: The mitral valve is normal in structure. No evidence of mitral valve  regurgitation. Tricuspid Valve: The tricuspid valve is not well visualized. Tricuspid valve regurgitation is not demonstrated. Aortic Valve: The aortic valve was not well  visualized. Aortic valve regurgitation is not visualized. Aortic valve mean gradient measures 1.0 mmHg. Aortic valve peak gradient measures 2.3 mmHg. Aortic valve area, by VTI measures 2.85 cm. Pulmonic Valve: The pulmonic valve was not well visualized. Pulmonic valve regurgitation is not visualized. Aorta: The aortic root was not well visualized. Venous: The inferior vena cava was not well visualized. IAS/Shunts: No atrial level shunt detected by color flow Doppler.  LEFT VENTRICLE PLAX 2D LVIDd:         4.53 cm   Diastology LVIDs:         3.13 cm   LV e' medial:    5.44 cm/s LV PW:         1.05 cm   LV E/e' medial:  10.3 LV IVS:        1.30 cm   LV e' lateral:   6.96 cm/s LVOT diam:     2.00 cm   LV E/e' lateral: 8.1 LV SV:         37 LV SV Index:   20 LVOT Area:     3.14 cm  RIGHT VENTRICLE RV Basal diam:  4.83 cm RV S prime:     6.85 cm/s TAPSE (M-mode): 3.8 cm LEFT ATRIUM         Index LA diam:    4.30 cm 2.30 cm/m  AORTIC VALVE AV Area (Vmax):    2.29 cm AV Area (Vmean):   2.12 cm AV Area (VTI):     2.85 cm AV Vmax:           76.00 cm/s AV Vmean:          49.400 cm/s AV VTI:            0.129 m AV Peak Grad:      2.3 mmHg AV Mean Grad:      1.0 mmHg LVOT Vmax:         55.50 cm/s LVOT Vmean:        33.400 cm/s LVOT VTI:          0.117 m LVOT/AV VTI ratio: 0.91  AORTA Ao Root diam: 3.10 cm MITRAL VALVE               TRICUSPID VALVE MV Area (PHT): 3.61 cm    TR Peak grad:   41.7 mmHg MV Decel Time: 210 msec    TR Vmax:        323.00 cm/s MV E velocity: 56.10 cm/s MV A velocity: 78.00 cm/s  SHUNTS MV E/A ratio:  0.72        Systemic VTI:  0.12 m                            Systemic Diam: 2.00 cm Kate Sable MD Electronically signed by Kate Sable MD Signature Date/Time: 11/01/2021/3:49:37 PM    Final      Medications    Scheduled Meds:  ALPRAZolam  0.5 mg Oral BID   ALPRAZolam  0.5 mg Oral Once   aspirin EC  81 mg Oral Daily   busPIRone  10 mg Oral BID   Chlorhexidine Gluconate Cloth  6 each Topical Daily   doxycycline  100 mg Oral Q12H   enoxaparin (LOVENOX) injection  40 mg Subcutaneous QHS   fluticasone furoate-vilanterol  1 puff Inhalation Daily   And   umeclidinium bromide  1 puff Inhalation Daily   folic acid  [id number] mcg Oral QPM   insulin  aspart  0-5 Units Subcutaneous QHS   insulin aspart  0-9 Units Subcutaneous TID WC   insulin aspart  4 Units Subcutaneous TID WC   insulin glargine-yfgn  12 Units Subcutaneous Daily   methylPREDNISolone (SOLU-MEDROL) injection  0.5 mg/kg Intravenous Q12H   Followed by   Melene Muller ON 11/04/2021] predniSONE  50 mg Oral Daily   metoprolol succinate  50 mg Oral Daily   pantoprazole  40 mg Oral Daily   Continuous Infusions:  sodium bicarbonate in 0.45 NS mL infusion 75 mL/hr at 11/02/21 1907       LOS: 2 days    Time spent: 30 minutes    Pennie Banter, DO [facility]  11/02/2021, 7:09 PM      If 7PM-7AM, please contact night-coverage. How to contact the [facility] Attending or Consulting provider 7A - 7P or covering provider during after hours 7P -7A, for this patient?    Check the care team in [facility] and look for a) attending/consulting [facility] provider listed and b) the [facility] team listed Log into [email or web address] and use [place] universal password to access. If you do not have the password, please contact the hospital operator. Locate the [facility] provider you are looking for under [facility] and page to a number that you can be directly reached. If you still have difficulty reaching the provider, please page the [facility] (Director on Call) for the Hospitalists listed on amion for assistance.

## 2021-11-02 NOTE — Progress Notes (Signed)
Patient refusing lab work this morning. Dr. Denton Lank made aware.

## 2021-11-02 NOTE — Progress Notes (Addendum)
Inpatient Diabetes Program Recommendations  AACE/ADA: New Consensus Statement on Inpatient Glycemic Control (2015)  Target Ranges:  Prepandial:   less than 140 mg/dL      Peak postprandial:   less than 180 mg/dL (1-2 hours)      Critically ill patients:  140 - 180 mg/dL    Latest Reference Range & Units 11/01/21 08:07 11/01/21 11:47 11/01/21 16:17 11/01/21 21:01  Glucose-Capillary 70 - 99 mg/dL 220 (H)  5 units Novolog  267 (H)  5 units Novolog  220 (H)  3 units Novolog  348 (H)  4 units Novolog     Latest Reference Range & Units 11/02/21 08:03  Glucose-Capillary 70 - 99 mg/dL 254 (H)     Home DM Meds: Glucotrol 10 mg M-W-F & other days 5 mg daily     Metformin 1000 mg BID  Current Orders: Novolog Sensitive Correction Scale/ SSI (0-9 units) TID AC + HS     MD- Note patient getting Solumedrol 37.6 mg BID--will start Prednisone daily 11/19.  Home Oral DM meds on hold.  AM CBGs elevated the last 2 days  Please consider:  1. Start Semglee 12 units Daily (0.15 units/kg)  2. Start Novolog Meal Coverage: Novolog 4 units TID with meals Hold if pt eats <50% of meal, Hold if pt NPO    --Will follow patient during hospitalization--  Ambrose Finland RN, MSN, CDE Diabetes Coordinator Inpatient Glycemic Control Team Team Pager: [phone] (8a-5p)

## 2021-11-03 DIAGNOSIS — U071 COVID-19: Secondary | ICD-10-CM | POA: Diagnosis not present

## 2021-11-03 LAB — CBC
HCT: 40.5 % (ref 39.0–52.0)
Hemoglobin: 13.9 g/dL (ref 13.0–17.0)
MCH: 30.5 pg (ref 26.0–34.0)
MCHC: 34.3 g/dL (ref 30.0–36.0)
MCV: 89 fL (ref 80.0–100.0)
Platelets: 190 10*3/uL (ref 150–400)
RBC: 4.55 MIL/uL (ref 4.22–5.81)
RDW: 13.8 % (ref 11.5–15.5)
WBC: 9.7 10*3/uL (ref 4.0–10.5)
nRBC: 0 % (ref 0.0–0.2)

## 2021-11-03 LAB — PROTEIN ELECTRO, RANDOM URINE
Albumin ELP, Urine: 68.3 %
Alpha-1-Globulin, U: 3.4 %
Alpha-2-Globulin, U: 9.3 %
Beta Globulin, U: 10.3 %
Gamma Globulin, U: 8.8 %
Total Protein, Urine: 57.9 mg/dL

## 2021-11-03 LAB — MAGNESIUM: Magnesium: 2 mg/dL (ref 1.7–2.4)

## 2021-11-03 LAB — GLUCOSE, CAPILLARY
Glucose-Capillary: 293 mg/dL — ABNORMAL HIGH (ref 70–99)
Glucose-Capillary: 215 mg/dL — ABNORMAL HIGH (ref 70–99)
Glucose-Capillary: 209 mg/dL — ABNORMAL HIGH (ref 70–99)
Glucose-Capillary: 194 mg/dL — ABNORMAL HIGH (ref 70–99)

## 2021-11-03 LAB — BASIC METABOLIC PANEL
Anion gap: 7 (ref 5–15)
BUN: 60 mg/dL — ABNORMAL HIGH (ref 8–23)
CO2: 26 mmol/L (ref 22–32)
Calcium: 8.6 mg/dL — ABNORMAL LOW (ref 8.9–10.3)
Chloride: 109 mmol/L (ref 98–111)
Creatinine, Ser: 1.23 mg/dL (ref 0.61–1.24)
GFR, Estimated: >60 mL/min (ref >60)
Glucose, Bld: 264 mg/dL — ABNORMAL HIGH (ref 70–99)
Potassium: 4.8 mmol/L (ref 3.5–5.1)
Sodium: 142 mmol/L (ref 135–145)

## 2021-11-03 LAB — D-DIMER, QUANTITATIVE: D-Dimer, Quant: 0.63 ug/mL-FEU — ABNORMAL HIGH (ref 0.00–0.50)

## 2021-11-03 LAB — FERRITIN: Ferritin: 61 ng/mL (ref 24–336)

## 2021-11-03 LAB — C-REACTIVE PROTEIN: CRP: 1 mg/dL — ABNORMAL HIGH (ref <1.0)

## 2021-11-03 MED ORDER — ALBUTEROL SULFATE HFA 108 (90 BASE) MCG/ACT IN AERS
2.0000 | INHALATION_SPRAY | Freq: Four times a day (QID) | RESPIRATORY_TRACT | Status: DC | PRN
  Filled 2021-11-03: qty 6.7

## 2021-11-03 MED ORDER — INSULIN GLARGINE-YFGN 100 UNIT/ML ~~LOC~~ SOLN
15.0000 [IU] | Freq: Every day | SUBCUTANEOUS | Status: DC
  Administered 2021-11-04 – 2021-11-06 (×3): 15 [IU] via SUBCUTANEOUS
  Filled 2021-11-03 (×3): qty 0.15

## 2021-11-03 MED ORDER — INSULIN ASPART 100 UNIT/ML IJ SOLN
8.0000 [IU] | Freq: Three times a day (TID) | INTRAMUSCULAR | Status: DC
Start: 2021-11-03 — End: 2021-11-05
  Administered 2021-11-03 – 2021-11-05 (×8): 8 [IU] via SUBCUTANEOUS
  Filled 2021-11-03 (×7): qty 1

## 2021-11-03 NOTE — Progress Notes (Signed)
[facility]  ROUNDING NOTE   Subjective:   Moved out of the ICU. No complaints. States he is weaning down the oxygen himself.   Objective:  Vital signs in last 24 hours:  Temp:  [97.5 F (36.4 C)-98.7 F (37.1 C)] 97.5 F (36.4 C) (11/18 1249) Pulse Rate:  [55-70] 64 (11/18 1249) Resp:  [18-23] 20 (11/18 1249) BP: (139-170)/(59-127) 170/71 (11/18 1249) SpO2:  [93 %-96 %] 96 % (11/18 1249)  Weight change:  Filed Weights   10/31/21 1331 11/01/21 0129 11/02/21 0500  Weight: 74.8 kg 79.8 kg 80.3 kg    Intake/Output: I/O last 3 completed shifts: In: 1969 [I.V.:1969] Out: 1600 [Urine:1600]   Intake/Output this shift:  Total I/O In: -  Out: 200 [Urine:200]  Physical Exam: General: NAD, sitting in chair  Head: Normocephalic, atraumatic. Moist oral mucosal membranes  Eyes: Anicteric, PERRL  Neck: Supple, trachea midline  Lungs:  [place] 5L   Heart: Regular rate and rhythm  Abdomen:  Soft, nontender,   Extremities:  no peripheral edema.  Neurologic: Nonfocal, moving all four extremities  Skin: No lesions        Basic Metabolic Panel: Recent Labs  Lab 10/31/21 1435 11/01/21 0001 11/01/21 0258 11/02/21 0536 11/03/21 0636  NA 132* 132* 133* 137 142  K 7.2* 6.1* 6.0* 5.2* 4.8  CL 105 105 103 107 109  CO2 15* 17* 22 24 26   GLUCOSE 163* 227* 244* 292* 264*  BUN 70* 76* 72* 67* 60*  CREATININE 2.50* 2.29* 2.00* 1.36* 1.23  CALCIUM 8.8* 8.5* 8.0* 8.5* 8.6*  MG  --   --  2.2  --  2.0  PHOS  --   --  3.5  --   --      Liver Function Tests: Recent Labs  Lab 10/31/21 1435  AST 26  ALT 58*  ALKPHOS 112  BILITOT 1.0  PROT 7.1  ALBUMIN 3.9    No results for input(s): LIPASE, AMYLASE in the last 168 hours. No results for input(s): AMMONIA in the last 168 hours.  CBC: Recent Labs  Lab 10/31/21 1335 11/01/21 0258 11/02/21 0536 11/03/21 0636  WBC 9.6 6.7 9.6 9.7  NEUTROABS 6.6  --   --   --   HGB 17.4* 15.1 15.0 13.9  HCT 51.8 43.4 42.8 40.5   MCV 91.8 89.5 88.8 89.0  PLT 235 171 198 190     Cardiac Enzymes: Recent Labs  Lab 11/01/21 0913  CKTOTAL 69     BNP: Invalid input(s): POCBNP  CBG: Recent Labs  Lab 11/02/21 1528 11/02/21 1852 11/02/21 1954 11/03/21 0836 11/03/21 1251  GLUCAP 234* 302* 291* 293* 215*     Microbiology: Results for orders placed or performed during the hospital encounter of 10/31/21  Resp Panel by RT-PCR (Flu A&B, Covid) Nasopharyngeal Swab     Status: Abnormal   Collection Time: 10/31/21  2:39 PM   Specimen: Nasopharyngeal Swab; Nasopharyngeal(NP) swabs in vial transport medium  Result Value Ref Range Status   SARS Coronavirus 2 by RT PCR POSITIVE (A) NEGATIVE Corrected    Comment: READ BACK AND VERIFIED BY STEPHANIE RUDD, RN AT 1606 10/31/21 BY [facility] RESULT CALLED TO, READ BACK BY AND VERIFIED WITH: (NOTE) SARS-CoV-2 target nucleic acids are DETECTED.  The SARS-CoV-2 RNA is generally detectable in upper respiratory specimens during the acute phase of infection. Positive results are indicative of the presence of the identified virus, but do not rule out bacterial infection or co-infection with other pathogens not  detected by the test. Clinical correlation with patient history and other diagnostic information is necessary to determine patient infection status. The expected result is Negative.  Fact Sheet for Patients: BloggerCourse.com  Fact Sheet for Healthcare Providers: SeriousBroker.it  This test is not yet approved or cleared by the [place] FDA and  has been authorized for detection and/or diagnosis of SARS-CoV-2 by FDA under an Emergency Use Authorization (EUA).  This EUA will remain  in effect (meaning this test can be used) for the duration of  the COVID-19 declaration under Section 564(b)(1) of the Act, 21 U.S.C. section 360bbb-3(b)(1), unless the authorization is terminated or revoked sooner.  CORRECTED ON  11/16 AT 0800: PREVIOUSLY REPORTED AS POSITIVE READ BACK AND VERIFIED BY STEPHANIE RUDD, RN AT 1606 10/31/21 BY [facility]    Influenza A by PCR NEGATIVE NEGATIVE Final   Influenza B by PCR NEGATIVE NEGATIVE Final    Comment: (NOTE) The Xpert Xpress SARS-CoV-2/FLU/RSV plus assay is intended as an aid in the diagnosis of influenza from Nasopharyngeal swab specimens and should not be used as a sole basis for treatment. Nasal washings and aspirates are unacceptable for Xpert Xpress SARS-CoV-2/FLU/RSV testing.  Fact Sheet for Patients: BloggerCourse.com  Fact Sheet for Healthcare Providers: SeriousBroker.it  This test is not yet approved or cleared by the [place] FDA and has been authorized for detection and/or diagnosis of SARS-CoV-2 by FDA under an Emergency Use Authorization (EUA). This EUA will remain in effect (meaning this test can be used) for the duration of the COVID-19 declaration under Section 564(b)(1) of the Act, 21 U.S.C. section 360bbb-3(b)(1), unless the authorization is terminated or revoked.  Performed at [facility], [address] [place], [place] [address]     Coagulation Studies: Recent Labs    10/31/21 2102/04/27  LABPROT 14.8  INR 1.2     Urinalysis: Recent Labs    11/01/21 0931  COLORURINE YELLOW*  LABSPEC 1.017  PHURINE 5.0  GLUCOSEU 150*  HGBUR NEGATIVE  BILIRUBINUR NEGATIVE  KETONESUR 5*  PROTEINUR 100*  NITRITE NEGATIVE  LEUKOCYTESUR NEGATIVE       Imaging: NM Pulmonary Perfusion  Result Date: 11/02/2021 CLINICAL DATA:  PE suspected EXAM: NUCLEAR MEDICINE PERFUSION LUNG SCAN TECHNIQUE: Perfusion images were obtained in multiple projections after intravenous injection of radiopharmaceutical. Ventilation scans intentionally deferred if perfusion scan and chest x-ray adequate for interpretation during COVID 19 epidemic. RADIOPHARMACEUTICALS:  4.5 mCi Tc-33m MAA IV COMPARISON:   Same-day chest radiographs FINDINGS: Cardiomegaly.  Normal, homogeneous perfusion of the lungs. IMPRESSION: 1. Very low probability examination for pulmonary embolism by modified perfusion only PIOPED criteria (PE absent). 2.  Cardiomegaly. Electronically Signed   By: Jearld Lesch M.D.   On: 11/02/2021 11:52   DG Chest Port 1 View  Result Date: 11/02/2021 CLINICAL DATA:  Shortness of breath and hypoxia. Coronavirus infection. EXAM: PORTABLE CHEST 1 VIEW COMPARISON:  11/01/2021 FINDINGS: Previous median sternotomy, CABG and aortic valve replacement. Chronic cardiomegaly. Chronic interstitial lung markings without evidence of acute infiltrate, collapse or effusion. No significant bone finding. IMPRESSION: Previous CABG and AVR. Cardiomegaly. Chronic pulmonary markings but no sign of acute infiltrate or collapse. Electronically Signed   By: Paulina Fusi M.D.   On: 11/02/2021 09:48     Medications:      ALPRAZolam  0.5 mg Oral BID   aspirin EC  81 mg Oral Daily   busPIRone  10 mg Oral BID   Chlorhexidine Gluconate Cloth  6 each Topical Daily   doxycycline  100 mg Oral Q12H   enoxaparin (LOVENOX) injection  40 mg Subcutaneous QHS   fluticasone furoate-vilanterol  1 puff Inhalation Daily   And   umeclidinium bromide  1 puff Inhalation Daily   folic acid  500 mcg Oral QPM   insulin aspart  0-5 Units Subcutaneous QHS   insulin aspart  0-9 Units Subcutaneous TID WC   insulin aspart  8 Units Subcutaneous TID WC   [START ON 11/04/2021] insulin glargine-yfgn  15 Units Subcutaneous Daily   metoprolol succinate  50 mg Oral Daily   pantoprazole  40 mg Oral Daily   [START ON 11/04/2021] predniSONE  50 mg Oral Daily   albuterol, chlorpheniramine-HYDROcodone, docusate sodium, guaiFENesin, guaiFENesin-dextromethorphan, ondansetron (ZOFRAN) IV, oxymetazoline, polyethylene glycol, traZODone  Assessment/ Plan:  Mr. Adeoluwa Silvers is a 75 y.o. white male with coronary artery disease status post CABG, COPD,  history of GI bleed, hypertension, hyperlipidemia, aortic valve replacement, diabetes mellitus type II who was admitted to [facility] on 10/31/2021 for Hyperkalemia [E87.5] AKI (acute kidney injury) (HCC) [N17.9] Acute respiratory failure, unspecified whether with hypoxia or hypercapnia (HCC) [J96.00] Acute renal failure, unspecified acute renal failure type (HCC) [N17.9] Acute hypercapnic respiratory failure (HCC) [J96.02] COVID-19 [U07.1]   Acute kidney injury  Hyperkalemia Metabolic acidosis (acute) Hyponatremia   Baseline creatinine of 1.09 with GFR > 60 on 02/09/21. Patient's kidney function has improved with IV fluids and PO intake.  Sodium, potassium and kidney function ow at goal. now at goal.  Acute kidney injury most likely patient has ATN from hypoxia. Hyperkalemia from ATN and consuming high potassium beverages.  Renal ultrasound without obstruction. No IV contrast exposure.   No acute indication for dialysis.  Urine studies reviewed.  - Continue low potassium diet - Continue to hold losartan, furosemide, metformin.  - Will need outpatient follow up with nephrology   LOS: 3 Suleyma Wafer 11/18/20222:34 PM

## 2021-11-03 NOTE — Progress Notes (Signed)
Inpatient Diabetes Program Recommendations  AACE/ADA: New Consensus Statement on Inpatient Glycemic Control (2015)  Target Ranges:  Prepandial:   less than 140 mg/dL      Peak postprandial:   less than 180 mg/dL (1-2 hours)      Critically ill patients:  140 - 180 mg/dL    Latest Reference Range & Units 11/02/21 08:03 11/02/21 11:07 11/02/21 15:28 11/02/21 18:52 11/02/21 19:54  Glucose-Capillary 70 - 99 mg/dL 045 (H) 409 (H) 811 (H) 302 (H) 291 (H)  (H): Data is abnormally high  Latest Reference Range & Units 11/03/21 08:36 11/03/21 12:51  Glucose-Capillary 70 - 99 mg/dL 914 (H) 782 (H)  (H): Data is abnormally high    Home DM Meds: Glucotrol 10 mg M-W-F & other days 5 mg daily     Metformin 1000 mg BID    Current Orders: Novolog Sensitive Correction Scale/ SSI (0-9 units) TID AC + HS     Novolog 8 units TID with meals     Semglee 12 units Daily   Last dose Solumedrol due today  To start Prednisone tomorrow 11/19  CBGs remain quite elevated likely due to COVID and Steroids  MD- Please consider:  Increase Semglee slightly to 15 units daily    --Will follow patient during hospitalization--  Ambrose Finland RN, MSN, CDE Diabetes Coordinator Inpatient Glycemic Control Team Team Pager: [phone] (8a-5p)

## 2021-11-03 NOTE — Progress Notes (Signed)
PROGRESS NOTE    Harold Parker   ZOX:[id number]  DOB: [birth year]  PCP: Merlene Laughter, MD    DOA: 10/31/2021 LOS: 3    Brief Narrative / Hospital Course to Date:   75 year old male with a past medical history significant for COPD and severe COVID-19 pneumonia who has chronic respiratory failure with hypoxia on 4 Lmin at baseline.  He presented to [facility] ED on 10/31/21  with of generalized weakness, shortness of breath, worsening hypoxia, and a recent positive COVID-19 home test after a trip to [place].    He also reported body aches, fevers/chills and increased cough with occasional production of green mucus, poor appetite but drinking plenty of fluids including "Body Armour" drinks for electrolytes.  He called Dr. Jayme Cloud, his pulmonologist, who called in a prescription of molnupiravir.    He was admitted to ICU due to worsening hypoxemic respiratory failure and acute kidney injury with hyperkalemia.  Transferred to [facility] service on 11/02/21.  Assessment & Plan   Active Problems:   COVID-19   Pressure injury of skin   Acute metabolic acidosis   AKI, Hyperkalemia, Metabolic Acidosis, CKD stage IIIa- Improving. 11/17: Cr 1.36, K 5.2, on bicarb gtt --Nephrology following, see recs --follow BMP's  Acute on chronic hypoxic respiratory failure due to Acute Covid-19 Infection with history of Severe Covid-19 earlier this year.  On 3-4 L/min O2 at baseline.  Transition from IV to oral steroids on 11/19 --Doxycycline x 5 days -- Continue Solu-medrol, transition to prednisone tomorrow --Supplement O2 to keep sats >90%, back on baseline 3-4 L --Continue Brovana, Pulmicort, Yupelri --PRN albuterol --Scheduled Mucinex and as needed Robitussin  Pulmonary embolism RULED OUT - Echo showed RV strain, subsequent V/Q scan 11/17 low risk for PE and patient's oxygenation returned to baseline. LE doppler negative for DVT --transition off heparin gtt --VTE ppx Lovenox  Elevated troponin due  to demand ischemia - due to above. --telemetry  CAD, PAD, Hx of CABG Aortic stenosis, s/p bovine valve replacement -  --metoprolol, ASA --telemetry  Steroid-induced hyperglycemia History of type 2 diabetes Continue basal insulin 12 units daily Increase mealtime NovoLog from 4 to 8 units Continue sliding scale Continue adjusting insulin as needed for inpatient goal 140-180  Patient BMI: Body mass index is 29.46 kg/m.   DVT prophylaxis: enoxaparin (LOVENOX) injection 40 mg Start: 11/02/21 2200   Diet:  Diet Orders (From admission, onward)     Start     Ordered   11/01/21 0911  Diet renal with fluid restriction Fluid restriction: 1500 mL Fluid; Room service appropriate? Yes; Fluid consistency: Thin  Diet effective now       Question Answer Comment  Fluid restriction: 1500 mL Fluid   Room service appropriate? Yes   Fluid consistency: Thin      11/01/21 0910              Code Status: Full Code   Subjective 11/03/21    Pt up in recliner when seen today.  He reports this morning having a bout of significant anxiety from being cooped up in the room, improved with medication given.  He states his nose is very dry, was blowing his nose and had some small amount of bleeding.  Otherwise he says he feels very good.  No other acute complaints.   Disposition Plan & Communication   Status is: Inpatient  Remains inpatient appropriate because: remains on IV steroids, severity of illness.      Consults, Procedures, Significant Events   Consultants:  PCCM Nephrology  Procedures:  Echo  Antimicrobials:  Anti-infectives (From admission, onward)    Start     Dose/Rate Route Frequency Ordered Stop   10/31/21 2200  doxycycline (VIBRA-TABS) tablet 100 mg        100 mg Oral Every 12 hours 10/31/21 1750           Micro    Objective   Vitals:   11/03/21 0500 11/03/21 0837 11/03/21 0902 11/03/21 1249  BP: (!) 156/63 (!) 159/59  (!) 170/71  Pulse: 62 (!) 55 67 64   Resp: 20 20  20   Temp: 98.7 F (37.1 C) 97.6 F (36.4 C)  (!) 97.5 F (36.4 C)  TempSrc: Oral Oral  Oral  SpO2: 93% 96%  96%  Weight:      Height:        Intake/Output Summary (Last 24 hours) at 11/03/2021 1355 Last data filed at 11/03/2021 0830 Gross per 24 hour  Intake 720.85 ml  Output 750 ml  Net -29.15 ml   Filed Weights   10/31/21 1331 11/01/21 0129 11/02/21 0500  Weight: 74.8 kg 79.8 kg 80.3 kg    Physical Exam:  General exam: awake, alert, no acute distress, up in recliner chair Respiratory system: normal respiratory effort, on 6 L/min HFNC O2. Cardiovascular system: Regular rate and rhythm, no peripheral edema Central nervous system: A&O x4. no gross focal neurologic deficits, normal speech Extremities: moves all, no edema, normal tone Psychiatry: normal mood, congruent affect, judgement and insight appear normal  Labs   Data Reviewed: I have personally reviewed following labs and imaging studies  CBC: Recent Labs  Lab 10/31/21 1335 11/01/21 0258 11/02/21 0536 11/03/21 0636  WBC 9.6 6.7 9.6 9.7  NEUTROABS 6.6  --   --   --   HGB 17.4* 15.1 15.0 13.9  HCT 51.8 43.4 42.8 40.5  MCV 91.8 89.5 88.8 89.0  PLT 235 171 198 [id number]   Basic Metabolic Panel: Recent Labs  Lab 10/31/21 1435 11/01/21 0001 11/01/21 0258 11/02/21 0536 11/03/21 0636  NA 132* 132* 133* 137 142  K 7.2* 6.1* 6.0* 5.2* 4.8  CL 105 105 103 107 109  CO2 15* 17* 22 24 26   GLUCOSE 163* 227* 244* 292* 264*  BUN 70* 76* 72* 67* 60*  CREATININE 2.50* 2.29* 2.00* 1.36* 1.23  CALCIUM 8.8* 8.5* 8.0* 8.5* 8.6*  MG  --   --  2.2  --  2.0  PHOS  --   --  3.5  --   --    GFR: Estimated Creatinine Clearance: 49.1 mL/min (by C-G formula based on SCr of 1.23 mg/dL). Liver Function Tests: Recent Labs  Lab 10/31/21 1435  AST 26  ALT 58*  ALKPHOS 112  BILITOT 1.0  PROT 7.1  ALBUMIN 3.9   No results for input(s): LIPASE, AMYLASE in the last 168 hours. No results for input(s): AMMONIA in  the last 168 hours. Coagulation Profile: Recent Labs  Lab 10/31/21 2103  INR 1.2   Cardiac Enzymes: Recent Labs  Lab 11/01/21 0913  CKTOTAL 69   BNP (last 3 results) No results for input(s): PROBNP in the last 8760 hours. HbA1C: Recent Labs    11/01/21 0001  HGBA1C 6.8*   CBG: Recent Labs  Lab 11/02/21 1528 11/02/21 1852 11/02/21 1954 11/03/21 0836 11/03/21 1251  GLUCAP 234* 302* 291* 293* 215*   Lipid Profile: No results for input(s): CHOL, HDL, LDLCALC, TRIG, CHOLHDL, LDLDIRECT in the last 72 hours. Thyroid Function Tests: No  results for input(s): TSH, T4TOTAL, FREET4, T3FREE, THYROIDAB in the last 72 hours. Anemia Panel: Recent Labs    11/02/21 0536 11/03/21 0636  FERRITIN 86 61   Sepsis Labs: Recent Labs  Lab 10/31/21 2337  PROCALCITON <0.10    Recent Results (from the past 240 hour(s))  Resp Panel by RT-PCR (Flu A&B, Covid) Nasopharyngeal Swab     Status: Abnormal   Collection Time: 10/31/21  2:39 PM   Specimen: Nasopharyngeal Swab; Nasopharyngeal(NP) swabs in vial transport medium  Result Value Ref Range Status   SARS Coronavirus 2 by RT PCR POSITIVE (A) NEGATIVE Corrected    Comment: READ BACK AND VERIFIED BY STEPHANIE RUDD, RN AT 1606 10/31/21 BY [facility] RESULT CALLED TO, READ BACK BY AND VERIFIED WITH: (NOTE) SARS-CoV-2 target nucleic acids are DETECTED.  The SARS-CoV-2 RNA is generally detectable in upper respiratory specimens during the acute phase of infection. Positive results are indicative of the presence of the identified virus, but do not rule out bacterial infection or co-infection with other pathogens not detected by the test. Clinical correlation with patient history and other diagnostic information is necessary to determine patient infection status. The expected result is Negative.  Fact Sheet for Patients: EntrepreneurPulse.com.au  Fact Sheet for Healthcare  Providers: IncredibleEmployment.be  This test is not yet approved or cleared by the [place] FDA and  has been authorized for detection and/or diagnosis of SARS-CoV-2 by FDA under an Emergency Use Authorization (EUA).  This EUA will remain  in effect (meaning this test can be used) for the duration of  the COVID-19 declaration under Section 564(b)(1) of the Act, 21 U.S.C. section 360bbb-3(b)(1), unless the authorization is terminated or revoked sooner.  CORRECTED ON 11/16 AT 0800: PREVIOUSLY REPORTED AS POSITIVE READ BACK AND VERIFIED BY STEPHANIE RUDD, RN AT [id number] 10/31/21 BY [facility]    Influenza A by PCR NEGATIVE NEGATIVE Final   Influenza B by PCR NEGATIVE NEGATIVE Final    Comment: (NOTE) The Xpert Xpress SARS-CoV-2/FLU/RSV plus assay is intended as an aid in the diagnosis of influenza from Nasopharyngeal swab specimens and should not be used as a sole basis for treatment. Nasal washings and aspirates are unacceptable for Xpert Xpress SARS-CoV-2/FLU/RSV testing.  Fact Sheet for Patients: EntrepreneurPulse.com.au  Fact Sheet for Healthcare Providers: IncredibleEmployment.be  This test is not yet approved or cleared by the [place] FDA and has been authorized for detection and/or diagnosis of SARS-CoV-2 by FDA under an Emergency Use Authorization (EUA). This EUA will remain in effect (meaning this test can be used) for the duration of the COVID-19 declaration under Section 564(b)(1) of the Act, 21 U.S.C. section 360bbb-3(b)(1), unless the authorization is terminated or revoked.  Performed at [facility], [address] [place], [place] [address]       Imaging Studies   NM Pulmonary Perfusion  Result Date: 11/02/2021 CLINICAL DATA:  PE suspected EXAM: NUCLEAR MEDICINE PERFUSION LUNG SCAN TECHNIQUE: Perfusion images were obtained in multiple projections after intravenous injection of  radiopharmaceutical. Ventilation scans intentionally deferred if perfusion scan and chest x-ray adequate for interpretation during COVID 19 epidemic. RADIOPHARMACEUTICALS:  4.5 mCi Tc-82m MAA IV COMPARISON:  Same-day chest radiographs FINDINGS: Cardiomegaly.  Normal, homogeneous perfusion of the lungs. IMPRESSION: 1. Very low probability examination for pulmonary embolism by modified perfusion only PIOPED criteria (PE absent). 2.  Cardiomegaly. Electronically Signed   By: Delanna Ahmadi M.D.   On: 11/02/2021 11:52   DG Chest Port 1 View  Result Date: 11/02/2021 CLINICAL DATA:  Shortness of breath and hypoxia. Coronavirus infection. EXAM: PORTABLE CHEST 1 VIEW COMPARISON:  11/01/2021 FINDINGS: Previous median sternotomy, CABG and aortic valve replacement. Chronic cardiomegaly. Chronic interstitial lung markings without evidence of acute infiltrate, collapse or effusion. No significant bone finding. IMPRESSION: Previous CABG and AVR. Cardiomegaly. Chronic pulmonary markings but no sign of acute infiltrate or collapse. Electronically Signed   By: Nelson Chimes M.D.   On: 11/02/2021 09:48     Medications   Scheduled Meds:  ALPRAZolam  0.5 mg Oral BID   aspirin EC  81 mg Oral Daily   busPIRone  10 mg Oral BID   Chlorhexidine Gluconate Cloth  6 each Topical Daily   doxycycline  100 mg Oral Q12H   enoxaparin (LOVENOX) injection  40 mg Subcutaneous QHS   fluticasone furoate-vilanterol  1 puff Inhalation Daily   And   umeclidinium bromide  1 puff Inhalation Daily   folic acid  [id number] mcg Oral QPM   insulin aspart  0-5 Units Subcutaneous QHS   insulin aspart  0-9 Units Subcutaneous TID WC   insulin aspart  8 Units Subcutaneous TID WC   insulin glargine-yfgn  12 Units Subcutaneous Daily   metoprolol succinate  50 mg Oral Daily   pantoprazole  40 mg Oral Daily   [START ON 11/04/2021] predniSONE  50 mg Oral Daily   Continuous Infusions:       LOS: 3 days    Time spent: 25 minutes with > 50% spent  at bedside and in coordination of care     Ezekiel Slocumb, DO [facility]  11/03/2021, 1:55 PM      If 7PM-7AM, please contact night-coverage. How to contact the [facility] Attending or Consulting provider [place] or covering provider during after hours [place], for this patient?    Check the care team in [facility] and look for a) attending/consulting [facility] provider listed and b) the [facility] team listed Log into [email or web address] and use [place]'s universal password to access. If you do not have the password, please contact the hospital operator. Locate the [facility] provider you are looking for under [facility] and page to a number that you can be directly reached. If you still have difficulty reaching the provider, please page the [facility] (Director on Call) for the Hospitalists listed on amion for assistance.

## 2021-11-04 DIAGNOSIS — U071 COVID-19: Secondary | ICD-10-CM | POA: Diagnosis not present

## 2021-11-04 LAB — BASIC METABOLIC PANEL
Anion gap: 6 (ref 5–15)
BUN: 53 mg/dL — ABNORMAL HIGH (ref 8–23)
CO2: 28 mmol/L (ref 22–32)
Calcium: 9 mg/dL (ref 8.9–10.3)
Chloride: 107 mmol/L (ref 98–111)
Creatinine, Ser: 1.13 mg/dL (ref 0.61–1.24)
GFR, Estimated: >60 mL/min (ref >60)
Glucose, Bld: 208 mg/dL — ABNORMAL HIGH (ref 70–99)
Potassium: 4.8 mmol/L (ref 3.5–5.1)
Sodium: 141 mmol/L (ref 135–145)

## 2021-11-04 LAB — GLUCOSE, CAPILLARY
Glucose-Capillary: 249 mg/dL — ABNORMAL HIGH (ref 70–99)
Glucose-Capillary: 207 mg/dL — ABNORMAL HIGH (ref 70–99)
Glucose-Capillary: 199 mg/dL — ABNORMAL HIGH (ref 70–99)
Glucose-Capillary: 293 mg/dL — ABNORMAL HIGH (ref 70–99)

## 2021-11-04 LAB — BRAIN NATRIURETIC PEPTIDE: B Natriuretic Peptide: 1587.5 pg/mL — ABNORMAL HIGH (ref 0.0–100.0)

## 2021-11-04 MED ORDER — AMLODIPINE BESYLATE 5 MG PO TABS
5.0000 mg | ORAL_TABLET | Freq: Every day | ORAL | Status: DC
  Administered 2021-11-04 – 2021-11-05 (×2): 5 mg via ORAL
  Filled 2021-11-04 (×2): qty 1

## 2021-11-04 NOTE — Progress Notes (Addendum)
PROGRESS NOTE    Harold Parker   XHB:[id number]  DOB: 1946-03-29  PCP: Merlene Laughter, MD    DOA: 10/31/2021 LOS: 4    Brief Narrative / Hospital Course to Date:   75 year old male with a past medical history significant for COPD and severe COVID-19 pneumonia who has chronic respiratory failure with hypoxia on 4 Lmin at baseline.  He presented to [facility] ED on 10/31/21  with of generalized weakness, shortness of breath, worsening hypoxia, and a recent positive COVID-19 home test after a trip to [place].    He also reported body aches, fevers/chills and increased cough with occasional production of green mucus, poor appetite but drinking plenty of fluids including "Body Armour" drinks for electrolytes.  He called Dr. Jayme Cloud, his pulmonologist, who called in a prescription of molnupiravir.    He was admitted to ICU due to worsening hypoxemic respiratory failure and acute kidney injury with hyperkalemia.  Transferred to [facility] service on 11/02/21.  Assessment & Plan   Active Problems:   COVID-19   Pressure injury of skin   Acute metabolic acidosis   AKI, Hyperkalemia, Acute Metabolic Acidosis, CKD stage IIIa - acidosis and AKI resolved. Now off bicarb drip --Nephrology following, recommendations:  - Continue low potassium diet - Continue to hold losartan, furosemide, metformin.  - Will need outpatient follow up with nephrology --follow BMP's  Acute on chronic hypoxic respiratory failure due to Acute Covid-19 Infection with history of Severe Covid-19 earlier this year.  On 3-4 L/min O2 at baseline.  Treated with high-dose IV steroids, transition to oral steroids today, 11/19. --Doxycycline x 5 days -- Continue prednisone and plan to discharge on taper --Close pulmonology follow-up after discharge, follows with Dr. Jayme Cloud --Supplement O2 to keep sats >90%, wean back to baseline 3-4 L/min --Continue Brovana, Pulmicort, Yupelri --PRN albuterol --Scheduled Mucinex and as needed  Robitussin  Pulmonary embolism RULED OUT - Echo showed RV strain, subsequent V/Q scan 11/17 low risk for PE and patient's oxygenation returned to baseline. LE doppler negative for DVT --Off empiric heparin gtt --VTE ppx Lovenox  Elevated troponin due to demand ischemia - due to above. --telemetry  CAD, PAD, Hx of CABG Aortic stenosis, s/p bovine valve replacement -  --metoprolol, ASA --telemetry --Losartan and Lasix on hold due to AKI  Hypertension -BP has been elevated likely due to steroids in addition to losartan and Lasix being held for AKI. --Continue amlodipine for now, Meds are held --Continue metoprolol  Steroid-induced hyperglycemia History of type 2 diabetes Metformin on hold Continue basal insulin 12 units daily Increase mealtime NovoLog from 4 to 8 units Continue sliding scale Continue adjusting insulin as needed for inpatient goal 140-180    Patient BMI: Body mass index is 29.46 kg/m.   DVT prophylaxis: enoxaparin (LOVENOX) injection 40 mg Start: 11/02/21 2200   Diet:  Diet Orders (From admission, onward)     Start     Ordered   11/01/21 0911  Diet renal with fluid restriction Fluid restriction: 1500 mL Fluid; Room service appropriate? Yes; Fluid consistency: Thin  Diet effective now       Question Answer Comment  Fluid restriction: 1500 mL Fluid   Room service appropriate? Yes   Fluid consistency: Thin      11/01/21 0910              Code Status: Full Code   Subjective 11/04/21    Pt up in recliner when seen today.  He reports this morning having a bout of significant  anxiety from being cooped up in the room, improved with medication given.  He states his nose is very dry, was blowing his nose and had some small amount of bleeding.  Otherwise he says he feels very good.  No other acute complaints.   Disposition Plan & Communication   Status is: Inpatient  Remains inpatient appropriate because: Continues to have oxygen requirements above  baseline, weaning down and anticipate discharge in the next 24 to 48 hours.      Consults, Procedures, Significant Events   Consultants:  PCCM Nephrology  Procedures:  Echo  Antimicrobials:  Anti-infectives (From admission, onward)    Start     Dose/Rate Route Frequency Ordered Stop   10/31/21 2200  doxycycline (VIBRA-TABS) tablet 100 mg        100 mg Oral Every 12 hours 10/31/21 1750 11/05/21 2159         Micro    Objective   Vitals:   11/04/21 0000 11/04/21 0618 11/04/21 0803 11/04/21 1134  BP: (!) 168/69 (!) 175/74 (!) 176/70 (!) 165/80  Pulse: 63 68 (!) 57 68  Resp: 20 (!) 21 18 20   Temp: 98 F (36.7 C) 97.9 F (36.6 C) 97.6 F (36.4 C)   TempSrc:      SpO2: 97% 98% 97% 94%  Weight:      Height:        Intake/Output Summary (Last 24 hours) at 11/04/2021 1341 Last data filed at 11/04/2021 0700 Gross per 24 hour  Intake 237 ml  Output 750 ml  Net -513 ml   Filed Weights   10/31/21 1331 11/01/21 0129 11/02/21 0500  Weight: 74.8 kg 79.8 kg 80.3 kg    Physical Exam:  General exam: awake sitting up in bed, alert, no acute distress Respiratory system: Lungs clear bilaterally without wheezes or rhonchi, normal respiratory effort, on 5 L/min HFNC O2. Cardiovascular system: Regular rate and rhythm, no peripheral edema Central nervous system: A&O x4. no gross focal neurologic deficits, normal speech Extremities: moves all, no edema, normal tone Psychiatry: normal mood, congruent affect, judgement and insight appear normal  Labs   Data Reviewed: I have personally reviewed following labs and imaging studies  CBC: Recent Labs  Lab 10/31/21 1335 11/01/21 0258 11/02/21 0536 11/03/21 0636  WBC 9.6 6.7 9.6 9.7  NEUTROABS 6.6  --   --   --   HGB 17.4* 15.1 15.0 13.9  HCT 51.8 43.4 42.8 40.5  MCV 91.8 89.5 88.8 89.0  PLT 235 171 198 [id number]   Basic Metabolic Panel: Recent Labs  Lab 11/01/21 0001 11/01/21 0258 11/02/21 0536 11/03/21 0636  11/04/21 0355  NA 132* 133* 137 142 141  K 6.1* 6.0* 5.2* 4.8 4.8  CL 105 103 107 109 107  CO2 17* 22 24 26 28   GLUCOSE 227* 244* 292* 264* 208*  BUN 76* 72* 67* 60* 53*  CREATININE 2.29* 2.00* 1.36* 1.23 1.13  CALCIUM 8.5* 8.0* 8.5* 8.6* 9.0  MG  --  2.2  --  2.0  --   PHOS  --  3.5  --   --   --    GFR: Estimated Creatinine Clearance: 53.4 mL/min (by C-G formula based on SCr of 1.13 mg/dL). Liver Function Tests: Recent Labs  Lab 10/31/21 1435  AST 26  ALT 58*  ALKPHOS 112  BILITOT 1.0  PROT 7.1  ALBUMIN 3.9   No results for input(s): LIPASE, AMYLASE in the last 168 hours. No results for input(s): AMMONIA in the last  168 hours. Coagulation Profile: Recent Labs  Lab 10/31/21 2103  INR 1.2   Cardiac Enzymes: Recent Labs  Lab 11/01/21 0913  CKTOTAL 69   BNP (last 3 results) No results for input(s): PROBNP in the last 8760 hours. HbA1C: No results for input(s): HGBA1C in the last 72 hours.  CBG: Recent Labs  Lab 11/03/21 1251 11/03/21 1650 11/03/21 2020 11/04/21 0804 11/04/21 1133  GLUCAP 215* 209* 194* 249* 207*   Lipid Profile: No results for input(s): CHOL, HDL, LDLCALC, TRIG, CHOLHDL, LDLDIRECT in the last 72 hours. Thyroid Function Tests: No results for input(s): TSH, T4TOTAL, FREET4, T3FREE, THYROIDAB in the last 72 hours. Anemia Panel: Recent Labs    11/02/21 0536 11/03/21 0636  FERRITIN 86 61   Sepsis Labs: Recent Labs  Lab 10/31/21 2337  PROCALCITON <0.10    Recent Results (from the past 240 hour(s))  Resp Panel by RT-PCR (Flu A&B, Covid) Nasopharyngeal Swab     Status: Abnormal   Collection Time: 10/31/21  2:39 PM   Specimen: Nasopharyngeal Swab; Nasopharyngeal(NP) swabs in vial transport medium  Result Value Ref Range Status   SARS Coronavirus 2 by RT PCR POSITIVE (A) NEGATIVE Corrected    Comment: READ BACK AND VERIFIED BY STEPHANIE RUDD, RN AT 1606 10/31/21 BY [facility] RESULT CALLED TO, READ BACK BY AND VERIFIED  WITH: (NOTE) SARS-CoV-2 target nucleic acids are DETECTED.  The SARS-CoV-2 RNA is generally detectable in upper respiratory specimens during the acute phase of infection. Positive results are indicative of the presence of the identified virus, but do not rule out bacterial infection or co-infection with other pathogens not detected by the test. Clinical correlation with patient history and other diagnostic information is necessary to determine patient infection status. The expected result is Negative.  Fact Sheet for Patients: EntrepreneurPulse.com.au  Fact Sheet for Healthcare Providers: IncredibleEmployment.be  This test is not yet approved or cleared by the [place] FDA and  has been authorized for detection and/or diagnosis of SARS-CoV-2 by FDA under an Emergency Use Authorization (EUA).  This EUA will remain  in effect (meaning this test can be used) for the duration of  the COVID-19 declaration under Section 564(b)(1) of the Act, 21 U.S.C. section 360bbb-3(b)(1), unless the authorization is terminated or revoked sooner.  CORRECTED ON 11/16 AT 0800: PREVIOUSLY REPORTED AS POSITIVE READ BACK AND VERIFIED BY STEPHANIE RUDD, RN AT [id number] 10/31/21 BY [facility]    Influenza A by PCR NEGATIVE NEGATIVE Final   Influenza B by PCR NEGATIVE NEGATIVE Final    Comment: (NOTE) The Xpert Xpress SARS-CoV-2/FLU/RSV plus assay is intended as an aid in the diagnosis of influenza from Nasopharyngeal swab specimens and should not be used as a sole basis for treatment. Nasal washings and aspirates are unacceptable for Xpert Xpress SARS-CoV-2/FLU/RSV testing.  Fact Sheet for Patients: EntrepreneurPulse.com.au  Fact Sheet for Healthcare Providers: IncredibleEmployment.be  This test is not yet approved or cleared by the [place] FDA and has been authorized for detection and/or diagnosis of SARS-CoV-2 by FDA under an  Emergency Use Authorization (EUA). This EUA will remain in effect (meaning this test can be used) for the duration of the COVID-19 declaration under Section 564(b)(1) of the Act, 21 U.S.C. section 360bbb-3(b)(1), unless the authorization is terminated or revoked.  Performed at [facility], [address] [place], [place] [address]       Imaging Studies   No results found.   Medications   Scheduled Meds:  ALPRAZolam  0.5 mg Oral BID  amLODipine  5 mg Oral Daily   aspirin EC  81 mg Oral Daily   busPIRone  10 mg Oral BID   Chlorhexidine Gluconate Cloth  6 each Topical Daily   doxycycline  100 mg Oral Q12H   enoxaparin (LOVENOX) injection  40 mg Subcutaneous QHS   fluticasone furoate-vilanterol  1 puff Inhalation Daily   And   umeclidinium bromide  1 puff Inhalation Daily   folic acid  [id number] mcg Oral QPM   insulin aspart  0-5 Units Subcutaneous QHS   insulin aspart  0-9 Units Subcutaneous TID WC   insulin aspart  8 Units Subcutaneous TID WC   insulin glargine-yfgn  15 Units Subcutaneous Daily   metoprolol succinate  50 mg Oral Daily   pantoprazole  40 mg Oral Daily   predniSONE  50 mg Oral Daily   Continuous Infusions:       LOS: 4 days    Time spent: 25 minutes with > 50% spent at bedside and in coordination of care     Ezekiel Slocumb, DO [facility]  11/04/2021, 1:41 PM      If 7PM-7AM, please contact night-coverage. How to contact the [facility] Attending or Consulting provider [place] or covering provider during after hours [place], for this patient?    Check the care team in [facility] and look for a) attending/consulting TRH provider listed and b) the [facility] team listed Log into [email or web address] and use [place]'s universal password to access. If you do not have the password, please contact the hospital operator. Locate the [facility] provider you are looking for under [facility] and page to a number that you can be directly reached. If you  still have difficulty reaching the provider, please page the [facility] (Director on Call) for the Hospitalists listed on amion for assistance.

## 2021-11-05 DIAGNOSIS — U071 COVID-19: Secondary | ICD-10-CM | POA: Diagnosis not present

## 2021-11-05 LAB — GLUCOSE, CAPILLARY
Glucose-Capillary: 135 mg/dL — ABNORMAL HIGH (ref 70–99)
Glucose-Capillary: 155 mg/dL — ABNORMAL HIGH (ref 70–99)
Glucose-Capillary: 319 mg/dL — ABNORMAL HIGH (ref 70–99)
Glucose-Capillary: 348 mg/dL — ABNORMAL HIGH (ref 70–99)
Glucose-Capillary: 216 mg/dL — ABNORMAL HIGH (ref 70–99)

## 2021-11-05 LAB — BASIC METABOLIC PANEL
Anion gap: 4 — ABNORMAL LOW (ref 5–15)
BUN: 46 mg/dL — ABNORMAL HIGH (ref 8–23)
CO2: 32 mmol/L (ref 22–32)
Calcium: 9 mg/dL (ref 8.9–10.3)
Chloride: 108 mmol/L (ref 98–111)
Creatinine, Ser: 1.03 mg/dL (ref 0.61–1.24)
GFR, Estimated: >60 mL/min (ref >60)
Glucose, Bld: 151 mg/dL — ABNORMAL HIGH (ref 70–99)
Potassium: 4.2 mmol/L (ref 3.5–5.1)
Sodium: 144 mmol/L (ref 135–145)

## 2021-11-05 MED ORDER — LOSARTAN POTASSIUM 50 MG PO TABS
100.0000 mg | ORAL_TABLET | Freq: Every day | ORAL | Status: DC
  Administered 2021-11-05 – 2021-11-06 (×2): 100 mg via ORAL
  Filled 2021-11-05 (×2): qty 2

## 2021-11-05 MED ORDER — BUTALBITAL-APAP-CAFFEINE 50-325-40 MG PO TABS
1.0000 | ORAL_TABLET | Freq: Four times a day (QID) | ORAL | Status: DC | PRN
  Administered 2021-11-05: 1 via ORAL
  Filled 2021-11-05: qty 1

## 2021-11-05 MED ORDER — INSULIN ASPART 100 UNIT/ML IJ SOLN
7.0000 [IU] | Freq: Three times a day (TID) | INTRAMUSCULAR | Status: DC
  Administered 2021-11-05: 18:00:00 7 [IU] via SUBCUTANEOUS
  Filled 2021-11-05: qty 1

## 2021-11-05 MED ORDER — FUROSEMIDE 40 MG PO TABS
40.0000 mg | ORAL_TABLET | Freq: Every day | ORAL | Status: DC
  Administered 2021-11-05 – 2021-11-06 (×2): 40 mg via ORAL
  Filled 2021-11-05 (×2): qty 1

## 2021-11-05 NOTE — Progress Notes (Signed)
This patient is a COVID patient sustaining oxygen saturation in low 90's following decrease O2 5L to 4L.  Patient reports 3-4L at home.  Patient reports increased headache this am, relieved with Fioricet.  Patient is independent with all self care tasks with assistance from spouse.  Plan is to assess functional mobility in a.m. on 4L with possible discharge.

## 2021-11-05 NOTE — Progress Notes (Addendum)
PROGRESS NOTE    Harold Parker   [id number]  DOB: 1946-07-06  PCP: Lajean Manes, MD    DOA: 10/31/2021 LOS: 5    Brief Narrative / Hospital Course to Date:   75 year old male with a past medical history significant for COPD and severe COVID-19 pneumonia who has chronic respiratory failure with hypoxia on 4 Lmin at baseline.  He presented to [facility] ED on 10/31/21  with of generalized weakness, shortness of breath, worsening hypoxia, and a recent positive COVID-19 home test after a trip to [place].    He also reported body aches, fevers/chills and increased cough with occasional production of green mucus, poor appetite but drinking plenty of fluids including "Body Armour" drinks for electrolytes.  He called Dr. Patsey Berthold, his pulmonologist, who called in a prescription of molnupiravir.    He was admitted to ICU due to worsening hypoxemic respiratory failure and acute kidney injury with hyperkalemia.  Transferred to [facility] service on 11/02/21.  Assessment & Plan   Active Problems:   COVID-19   Pressure injury of skin   Acute metabolic acidosis   AKI, Hyperkalemia, Acute Metabolic Acidosis, CKD stage IIIa - acidosis and AKI resolved. Now off bicarb drip --Nephrology following --Lasix, losartan and metformin held since admission --follow BMP's --11/20: resume Lasix 40 mg PO daily, losartan 100 mg daily  Acute on chronic hypoxic respiratory failure due to Acute Covid-19 Infection with history of Severe Covid-19 earlier this year.  On 3-4 L/min O2 at baseline.  Treated with high-dose IV steroids, transition to oral steroids today, 11/19. --Completed Doxycycline x 5 days -- Continue prednisone and plan to discharge on taper --Close pulmonology follow-up after discharge, follows with Dr. Patsey Berthold --Supplement O2 to keep sats >88-94%, wean back to baseline 3-4 L/min --Continue Brovana, Pulmicort, Yupelri --PRN albuterol --Scheduled Mucinex and as needed Robitussin  Pulmonary  embolism RULED OUT - Echo showed RV strain, subsequent V/Q scan 11/17 low risk for PE and patient's oxygenation returned to baseline. LE doppler negative for DVT --Off empiric heparin gtt --VTE ppx Lovenox  Elevated troponin due to demand ischemia - due to above. --telemetry  CAD, PAD, Hx of CABG Aortic stenosis, s/p bovine valve replacement -  --metoprolol, ASA --telemetry --Resume Losartan and Lasix now that AKI resolved  Hypertension -BP elevated due to steroids in addition to losartan and Lasix being held for AKI. --Started amlodipine while meds are held --Continue metoprolol --11/20: resumed Lasix and losartan --stop amlodipine if not needed with resumed meds  Steroid-induced hyperglycemia - Improving now off IV steroids on prednisone History of type 2 diabetes Metformin on hold Continue basal insulin 12 units daily Adjust mealtime NovoLog 8 >> 7 units Continue sliding scale Continue adjusting insulin as needed for inpatient goal 140-180    Patient BMI: Body mass index is 29.46 kg/m.   DVT prophylaxis: enoxaparin (LOVENOX) injection 40 mg Start: 11/02/21 2200   Diet:  Diet Orders (From admission, onward)     Start     Ordered   11/01/21 0911  Diet renal with fluid restriction Fluid restriction: 1500 mL Fluid; Room service appropriate? Yes; Fluid consistency: Thin  Diet effective now       Question Answer Comment  Fluid restriction: 1500 mL Fluid   Room service appropriate? Yes   Fluid consistency: Thin      11/01/21 0910              Code Status: Full Code   Subjective 11/05/21    Pt up in recliner when  seen today.  He reports having a headache since getting upset early AM and overnight due to delays in care and issues with someone on night staff.  He otherwise says feeling overall well.  Oxygen not yet weaned down this AM.  Still on 5 L/min.     Disposition Plan & Communication   Status is: Inpatient  Remains inpatient appropriate because:  Continues to have oxygen requirements above baseline, weaning down and anticipate discharge in the next 24 hours.      Consults, Procedures, Significant Events   Consultants:  PCCM Nephrology  Procedures:  Echo  Antimicrobials:  Anti-infectives (From admission, onward)    Start     Dose/Rate Route Frequency Ordered Stop   10/31/21 2200  doxycycline (VIBRA-TABS) tablet 100 mg        100 mg Oral Every 12 hours 10/31/21 1750 11/05/21 0903         Micro    Objective   Vitals:   11/04/21 1949 11/04/21 2354 11/05/21 0822 11/05/21 1113  BP: (!) 172/63 (!) 181/68 (!) 191/84 (!) 142/77  Pulse: 66 61 60 68  Resp: 20 20 18 18   Temp: 97.7 F (36.5 C) 98.3 F (36.8 C) 97.8 F (36.6 C) (!) 97.5 F (36.4 C)  TempSrc: Oral Oral  Oral  SpO2: 95% 97% 97% 92%  Weight:      Height:        Intake/Output Summary (Last 24 hours) at 11/05/2021 1546 Last data filed at 11/04/2021 2323 Gross per 24 hour  Intake --  Output 850 ml  Net -850 ml   Filed Weights   10/31/21 1331 11/01/21 0129 11/02/21 0500  Weight: 74.8 kg 79.8 kg 80.3 kg    Physical Exam:  General exam: awake sitting up in recliner, alert, no acute distress Respiratory system: normal respiratory effort, on 5 L/min HFNC O2. Cardiovascular system: Regular rate and rhythm, no peripheral edema Central nervous system: A&O x4. no gross focal neurologic deficits, normal speech Extremities: moves all, no edema, normal tone Psychiatry: normal mood, congruent affect, judgement and insight appear normal  Labs   Data Reviewed: I have personally reviewed following labs and imaging studies  CBC: Recent Labs  Lab 10/31/21 1335 11/01/21 0258 11/02/21 0536 11/03/21 0636  WBC 9.6 6.7 9.6 9.7  NEUTROABS 6.6  --   --   --   HGB 17.4* 15.1 15.0 13.9  HCT 51.8 43.4 42.8 40.5  MCV 91.8 89.5 88.8 89.0  PLT 235 171 198 [id number]   Basic Metabolic Panel: Recent Labs  Lab 11/01/21 0258 11/02/21 0536 11/03/21 0636  11/04/21 0355 11/05/21 0350  NA 133* 137 142 141 144  K 6.0* 5.2* 4.8 4.8 4.2  CL 103 107 109 107 108  CO2 22 24 26 28  32  GLUCOSE 244* 292* 264* 208* 151*  BUN 72* 67* 60* 53* 46*  CREATININE 2.00* 1.36* 1.23 1.13 1.03  CALCIUM 8.0* 8.5* 8.6* 9.0 9.0  MG 2.2  --  2.0  --   --   PHOS 3.5  --   --   --   --    GFR: Estimated Creatinine Clearance: 58.6 mL/min (by C-G formula based on SCr of 1.03 mg/dL). Liver Function Tests: Recent Labs  Lab 10/31/21 1435  AST 26  ALT 58*  ALKPHOS 112  BILITOT 1.0  PROT 7.1  ALBUMIN 3.9   No results for input(s): LIPASE, AMYLASE in the last 168 hours. No results for input(s): AMMONIA in the last 168 hours. Coagulation  Profile: Recent Labs  Lab 10/31/21 2103  INR 1.2   Cardiac Enzymes: Recent Labs  Lab 11/01/21 0913  CKTOTAL 69   BNP (last 3 results) No results for input(s): PROBNP in the last 8760 hours. HbA1C: No results for input(s): HGBA1C in the last 72 hours.  CBG: Recent Labs  Lab 11/04/21 1133 11/04/21 1619 11/04/21 2132 11/05/21 0818 11/05/21 1116  GLUCAP 207* 199* 293* 135* 155*   Lipid Profile: No results for input(s): CHOL, HDL, LDLCALC, TRIG, CHOLHDL, LDLDIRECT in the last 72 hours. Thyroid Function Tests: No results for input(s): TSH, T4TOTAL, FREET4, T3FREE, THYROIDAB in the last 72 hours. Anemia Panel: Recent Labs    11/03/21 0636  FERRITIN 23   Sepsis Labs: Recent Labs  Lab 10/31/21 2337  PROCALCITON <0.10    Recent Results (from the past 240 hour(s))  Resp Panel by RT-PCR (Flu A&B, Covid) Nasopharyngeal Swab     Status: Abnormal   Collection Time: 10/31/21  2:39 PM   Specimen: Nasopharyngeal Swab; Nasopharyngeal(NP) swabs in vial transport medium  Result Value Ref Range Status   SARS Coronavirus 2 by RT PCR POSITIVE (A) NEGATIVE Corrected    Comment: READ BACK AND VERIFIED BY STEPHANIE RUDD, RN AT 1606 10/31/21 BY [facility] RESULT CALLED TO, READ BACK BY AND VERIFIED WITH: (NOTE) SARS-CoV-2  target nucleic acids are DETECTED.  The SARS-CoV-2 RNA is generally detectable in upper respiratory specimens during the acute phase of infection. Positive results are indicative of the presence of the identified virus, but do not rule out bacterial infection or co-infection with other pathogens not detected by the test. Clinical correlation with patient history and other diagnostic information is necessary to determine patient infection status. The expected result is Negative.  Fact Sheet for Patients: EntrepreneurPulse.com.au  Fact Sheet for Healthcare Providers: IncredibleEmployment.be  This test is not yet approved or cleared by the [place] FDA and  has been authorized for detection and/or diagnosis of SARS-CoV-2 by FDA under an Emergency Use Authorization (EUA).  This EUA will remain  in effect (meaning this test can be used) for the duration of  the COVID-19 declaration under Section 564(b)(1) of the Act, 21 U.S.C. section 360bbb-3(b)(1), unless the authorization is terminated or revoked sooner.  CORRECTED ON 11/16 AT 0800: PREVIOUSLY REPORTED AS POSITIVE READ BACK AND VERIFIED BY STEPHANIE RUDD, RN AT [id number] 10/31/21 BY [facility]    Influenza A by PCR NEGATIVE NEGATIVE Final   Influenza B by PCR NEGATIVE NEGATIVE Final    Comment: (NOTE) The Xpert Xpress SARS-CoV-2/FLU/RSV plus assay is intended as an aid in the diagnosis of influenza from Nasopharyngeal swab specimens and should not be used as a sole basis for treatment. Nasal washings and aspirates are unacceptable for Xpert Xpress SARS-CoV-2/FLU/RSV testing.  Fact Sheet for Patients: EntrepreneurPulse.com.au  Fact Sheet for Healthcare Providers: IncredibleEmployment.be  This test is not yet approved or cleared by the [place] FDA and has been authorized for detection and/or diagnosis of SARS-CoV-2 by FDA under an Emergency Use Authorization  (EUA). This EUA will remain in effect (meaning this test can be used) for the duration of the COVID-19 declaration under Section 564(b)(1) of the Act, 21 U.S.C. section 360bbb-3(b)(1), unless the authorization is terminated or revoked.  Performed at [facility], [address], [place], [place] [address]       Imaging Studies   No results found.   Medications   Scheduled Meds:  ALPRAZolam  0.5 mg Oral BID   amLODipine  5 mg  Oral Daily   aspirin EC  81 mg Oral Daily   busPIRone  10 mg Oral BID   Chlorhexidine Gluconate Cloth  6 each Topical Daily   enoxaparin (LOVENOX) injection  40 mg Subcutaneous QHS   fluticasone furoate-vilanterol  1 puff Inhalation Daily   And   umeclidinium bromide  1 puff Inhalation Daily   folic acid  500 mcg Oral QPM   insulin aspart  0-5 Units Subcutaneous QHS   insulin aspart  0-9 Units Subcutaneous TID WC   insulin aspart  8 Units Subcutaneous TID WC   insulin glargine-yfgn  15 Units Subcutaneous Daily   losartan  100 mg Oral Daily   metoprolol succinate  50 mg Oral Daily   pantoprazole  40 mg Oral Daily   predniSONE  50 mg Oral Daily   Continuous Infusions:       LOS: 5 days    Time spent: 25 minutes with > 50% spent at bedside and in coordination of care     Pennie Banter, DO [facility]  11/05/2021, 3:46 PM      If 7PM-7AM, please contact night-coverage. How to contact the [facility] Attending or Consulting provider 7A - 7P or covering provider during after hours 7P -7A, for this patient?    Check the care team in [facility] and look for a) attending/consulting TRH provider listed and b) the [facility] team listed Log into [email or web address] and use [place]'s universal password to access. If you do not have the password, please contact the hospital operator. Locate the [facility] provider you are looking for under [facility] and page to a number that you can be directly reached. If you still have difficulty reaching the  provider, please page the [facility] (Director on Call) for the Hospitalists listed on amion for assistance.

## 2021-11-06 LAB — PROTEIN ELECTROPHORESIS, SERUM
A/G Ratio: 1.2 (ref 0.7–1.7)
Albumin ELP: 3.3 g/dL (ref 2.9–4.4)
Alpha-1-Globulin: 0.2 g/dL (ref 0.0–0.4)
Alpha-2-Globulin: 0.8 g/dL (ref 0.4–1.0)
Beta Globulin: 1 g/dL (ref 0.7–1.3)
Gamma Globulin: 0.6 g/dL (ref 0.4–1.8)
Globulin, Total: 2.7 g/dL (ref 2.2–3.9)
Total Protein ELP: 6 g/dL (ref 6.0–8.5)

## 2021-11-06 LAB — BASIC METABOLIC PANEL WITH GFR
Anion gap: 4 — ABNORMAL LOW (ref 5–15)
BUN: 41 mg/dL — ABNORMAL HIGH (ref 8–23)
CO2: 33 mmol/L — ABNORMAL HIGH (ref 22–32)
Calcium: 9.2 mg/dL (ref 8.9–10.3)
Chloride: 107 mmol/L (ref 98–111)
Creatinine, Ser: 1.09 mg/dL (ref 0.61–1.24)
GFR, Estimated: >60 mL/min
Glucose, Bld: 94 mg/dL (ref 70–99)
Potassium: 4.4 mmol/L (ref 3.5–5.1)
Sodium: 144 mmol/L (ref 135–145)

## 2021-11-06 LAB — GLUCOSE, CAPILLARY
Glucose-Capillary: 132 mg/dL — ABNORMAL HIGH (ref 70–99)
Glucose-Capillary: 201 mg/dL — ABNORMAL HIGH (ref 70–99)

## 2021-11-06 LAB — MAGNESIUM: Magnesium: 1.8 mg/dL (ref 1.7–2.4)

## 2021-11-06 MED ORDER — PREDNISONE 10 MG PO TABS: ORAL_TABLET | ORAL | 0 refills | Status: AC

## 2021-11-06 MED ORDER — PREDNISONE 20 MG PO TABS
40.0000 mg | ORAL_TABLET | Freq: Every day | ORAL | Status: DC
  Administered 2021-11-06: 10:00:00 40 mg via ORAL
  Filled 2021-11-06 (×2): qty 4

## 2021-11-06 MED ORDER — AMLODIPINE BESYLATE 10 MG PO TABS
10.0000 mg | ORAL_TABLET | Freq: Every day | ORAL | Status: DC
  Administered 2021-11-06: 10 mg via ORAL
  Filled 2021-11-06: qty 1

## 2021-11-06 MED ORDER — AMLODIPINE BESYLATE 10 MG PO TABS: 10.0000 mg | ORAL_TABLET | Freq: Every day | ORAL | 1 refills | Status: DC

## 2021-11-06 NOTE — TOC Initial Note (Signed)
Transition of Care [facility]) - Initial/Assessment Note    Patient Details  Name: Harold Parker MRN: [id number] Date of Birth: [birth year]  Transition of Care [facility]) CM/SW Contact:    Caryn Section, RN Phone Number: 11/06/2021, 9:41 AM  Clinical Narrative:   Patient is COVID + anticipating discharge today.  He is currently 4L oxygen, requires tank to return home, Oletha Cruel at [employer] notified, will supply tank.  Patient states he lives at home with fiancee, who can assist if needed.  Steffanie Rainwater can transport to appointments and pharmacy, patient is able to take medications as directed, no concerns about returning home after discharge.  Patient states home oxygen is operational without concern.                Expected Discharge Plan: Home/Self Care Barriers to Discharge: Barriers Resolved   Patient Goals and CMS Choice     Choice offered to / list presented to : NA  Expected Discharge Plan and Services Expected Discharge Plan: Home/Self Care   Discharge Planning Services: CM Consult     Expected Discharge Date: 11/06/21               DME Arranged: Oxygen DME Agency: [employer] Date DME Agency Contacted: 11/06/21 Time DME Agency Contacted: [phone] Representative spoke with at DME Agency: Ian Malkin [employer] oxygen take to return home            Prior Living Arrangements/Services     Patient language and need for interpreter reviewed:: Yes (No interpreter required) Do you feel safe going back to the place where you live?: Yes      Need for Family Participation in Patient Care: Yes (Comment) Care giver support system in place?: Yes (comment) Current home services: DME Criminal Activity/Legal Involvement Pertinent to Current Situation/Hospitalization: No - Comment as needed  Activities of Daily Living Home Assistive Devices/Equipment: None ADL Screening (condition at time of admission) Patient's cognitive ability adequate to safely complete daily activities?: Yes Is  the patient deaf or have difficulty hearing?: No Does the patient have difficulty seeing, even when wearing glasses/contacts?: No Does the patient have difficulty concentrating, remembering, or making decisions?: No Patient able to express need for assistance with ADLs?: Yes Does the patient have difficulty dressing or bathing?: No Independently performs ADLs?: Yes (appropriate for developmental age) Does the patient have difficulty walking or climbing stairs?: No Weakness of Legs: Both Weakness of Arms/Hands: None  Permission Sought/Granted Permission sought to share information with : Case Manager Permission granted to share information with : Yes, Verbal Permission Granted     Permission granted to share info w AGENCY: [facility]        Emotional Assessment Appearance::  (spoke with patient via phone) Attitude/Demeanor/Rapport: Engaged, Gracious Affect (typically observed): Pleasant, Appropriate Orientation: : Oriented to Self, Oriented to Place, Oriented to  Time, Oriented to Situation Alcohol / Substance Use: Not Applicable Psych Involvement: No (comment)  Admission diagnosis:  Hyperkalemia [E87.5] AKI (acute kidney injury) (HCC) [N17.9] Acute respiratory failure, unspecified whether with hypoxia or hypercapnia (HCC) [J96.00] Acute renal failure, unspecified acute renal failure type (HCC) [N17.9] Acute hypercapnic respiratory failure (HCC) [J96.02] COVID-19 [U07.1] Patient Active Problem List   Diagnosis Date Noted   Pressure injury of skin 11/01/2021   Acute metabolic acidosis 11/01/2021   COVID-19 10/31/2021   Chronic respiratory failure with hypoxia (HCC) 05/02/2021   Insomnia    History of COVID-19 pneumonia 01/11/21 02/04/2021   Pulmonary nodules    Acute respiratory disease due to  COVID-19 virus 01/11/2021   HTN (hypertension)    Chronic systolic CHF (congestive heart failure) (HCC)    COPD (chronic obstructive pulmonary disease) (HCC)    GERD  (gastroesophageal reflux disease)    Anxiety    AKI (acute kidney injury) (HCC)    Hyperkalemia    Elevated troponin    Acute hypoxemic respiratory failure due to COVID-19 [facility])    Melena    Duodenal ulceration    Gastrointestinal hemorrhage    Severe anemia 03/14/2018   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   S/P aortic valve replacement with bioprosthetic valve  02/19/2018   Preoperative respiratory examination 02/13/2018   Acute on chronic respiratory failure with hypoxia ([place]) 02/11/2018   Influenza with respiratory manifestation 02/11/2018   Stage 3 severe COPD by GOLD classification ([place])    Aortic stenosis    Bronchitis, chronic obstructive, with exacerbation (HCC)    Tobacco abuse    CAD (coronary artery disease) [id number]   Acute systolic CHF (congestive heart failure) ([place]) 02/07/2018   Ischemic cardiomyopathy 02/07/2018   Type 2 diabetes mellitus with hyperglycemia, without long-term current use of insulin ([place]) 02/07/2018   Hyperlipidemia LDL goal <70 02/07/2018   COPD with acute exacerbation ([place]) 02/05/2018   NSTEMI (non-ST elevated myocardial infarction) ([place]) 02/05/2018   PCP:  Lajean Manes, MD Pharmacy:   [facility] [place], [place] - [address] [place] [address] Phone: [phone] Fax: [phone]  [id number] - [place], [place] [address] [place] [address] Phone: [phone] Fax: [place], [place] - [address] AT [facility] [place] [address] [facility] [place] [address] Phone: [phone] Fax: [phone]     Social Determinants of Health (SDOH) Interventions    Readmission Risk Interventions Readmission Risk Prevention Plan 11/06/2021 02/06/2021 01/13/2021  Transportation Screening Complete Complete Complete  PCP or Specialist Appt within 3-5 Days Complete  Complete Complete  HRI or Home Care Consult Complete Complete Complete  Social Work Consult for [place] Planning/Counseling Not Complete Complete Complete  SW consult not completed comments RNCM assigned to case - -  Palliative Care Screening Not Applicable Not Applicable Not Applicable  Medication Review (RN Care Manager) Complete Complete Complete  Some recent data might be hidden

## 2021-11-06 NOTE — Progress Notes (Signed)
SATURATION QUALIFICATIONS: (This note is used to comply with regulatory documentation for home oxygen)  Patient Saturations on Room Air at Rest = 82%  Patient Saturations on 4 Liters of oxygen while Ambulating = 89%  Patient able to ambulate in room on 4L [place] and maintain O2 sats >88%.  Patient had no increased WOB or dizziness.

## 2021-11-06 NOTE — Care Management Important Message (Signed)
Important Message  Patient Details  Name: Harold Parker MRN: [id number] Date of Birth: Apr 15, 1946   Medicare Important Message Given:  Yes  I reviewed the patient's Important Message from Medicare with Tretha Sciara at [phone]. She is in agreement with the discharge.  I asked if she would like a copy and she replied yes. I have sent securely via e-mail to: [email or web address].  I thanked her for her time.     Olegario Messier A Mykell Rawl 11/06/2021, 10:09 AM

## 2021-11-06 NOTE — Discharge Summary (Signed)
Physician Discharge Summary  Harold Parker [id number] DOB: Jun 20, 1946 DOA: 10/31/2021  PCP: Lajean Manes, MD  Admit date: 10/31/2021 Discharge date: 11/06/2021  Admitted From: home Disposition:  home  Recommendations for Outpatient Follow-up:  Follow up with PCP in 1-2 weeks Please obtain BMP/CBC in one week Please follow up on steroid-induced hyperglycemia Follow up on blood pressure & medication regimen Follow up with pulmonology  Home Health:  Equipment/Devices: Oxygen   Discharge Condition: stable  CODE STATUS: Full code Diet recommendation: Carb modified, heart healthy, fluid restriction   Discharge Diagnoses: Active Problems:   COVID-19   Pressure injury of skin   Acute metabolic acidosis    Summary of HPI and Hospital Course:   75 year old male with a past medical history significant for COPD and severe COVID-19 pneumonia who has chronic respiratory failure with hypoxia on 4 Lmin at baseline.  He presented to [facility] ED on 10/31/21  with of generalized weakness, shortness of breath, worsening hypoxia, and a recent positive COVID-19 home test after a trip to [place].     He also reported body aches, fevers/chills and increased cough with occasional production of green mucus, poor appetite but drinking plenty of fluids including "Body Armour" drinks for electrolytes.  He called Dr. Patsey Berthold, his pulmonologist, who called in a prescription of molnupiravir.     He was admitted to ICU due to worsening hypoxemic respiratory failure and acute kidney injury with hyperkalemia.   Transferred [facility] service on 11/02/21.    AKI, Hyperkalemia, Acute Metabolic Acidosis, CKD stage IIIa - acidosis and AKI resolved with bicarb drip. --Nephrology consulted --Lasix, losartan and metformin held due to AKI, resumed at discharge    Acute on chronic hypoxic respiratory failure due to Acute Covid-19 Infection with history of Severe Covid-19 earlier this year.  On 3-4 L/min O2 at  baseline.  Treated with high-dose IV steroids, transition to oral steroids today, 11/19. --Completed Doxycycline x 5 days -- Continue prednisone and plan to discharge on taper --Close pulmonology follow-up after discharge, follows with Dr. Patsey Berthold --Supplement O2 to keep sats >88-94% -- wean back to baseline 3-4 L/min --Continue Brovana, Pulmicort, Yupelri --PRN albuterol --Scheduled Mucinex and as needed Robitussin   Pulmonary embolism RULED OUT - Echo showed RV strain, subsequent V/Q scan 11/17 low risk for PE and patient's oxygenation returned to baseline. LE doppler negative for DVT Treated initially with empiric heparin gtt, transitioned to VTE ppx with Lovenox   Elevated troponin due to demand ischemia - due to above. --monitored on telemetry   CAD, PAD, Hx of CABG Aortic stenosis, s/p bovine valve replacement -  --metoprolol, ASA --telemetry --Resumed on Losartan and Lasix once AKI resolved   Hypertension -BP elevated due to steroids in addition to losartan and Lasix that were held for AKI. --Started amlodipine while meds are held, continue at d/c --Continue metoprolol --11/20: resumed Lasix and losartan --stop amlodipine if not needed in follow up  --pt advised to monitor BP at home until follow up   Steroid-induced hyperglycemia - Improving now off IV steroids on prednisone.  Covered with insulin during admission.  Patient declined insulin prescription at discharge. Advised to monitor glucose closely, very close outpatient follow up.  History of type 2 diabetes Metformin held - resume at d/c Treated with basal insulin and mealtime plus sliding coverage   Discharge Instructions   Discharge Instructions     (HEART FAILURE PATIENTS) Call MD:  Anytime you have any of the following symptoms: 1) 3 pound weight gain in  24 hours or 5 pounds in 1 week 2) shortness of breath, with or without a dry hacking cough 3) swelling in the hands, feet or stomach 4) if you have to  sleep on extra pillows at night in order to breathe.   Complete by: As directed    Call MD for:   Complete by: As directed    Worsening shortness of breath or needing to turn oxygen up above 4 L/min to keep your O2 level above 88%.   Call MD for:  extreme fatigue   Complete by: As directed    Call MD for:  persistant dizziness or light-headedness   Complete by: As directed    Call MD for:  persistant nausea and vomiting   Complete by: As directed    Call MD for:  severe uncontrolled pain   Complete by: As directed    Call MD for:  temperature >100.4   Complete by: As directed    Diet - low sodium heart healthy   Complete by: As directed    Discharge instructions   Complete by: As directed    STEROID TAPER -- Please take Prednisone taper as prescribed over next 7 days, starting with 40 mg tomorrow AM with breakfast.  Then go down my 10 mg every two days, until gone.  You should resume your usual inhalers and nebulizer treatments as prescribed by Dr. Patsey Berthold. Please follow up with her in about 2 weeks.   BLOOD SUGARS -- please monitor your sugars closely.  Steroids cause sugars to run high, and we've been using insulin here in the hospital.  If you consistently see sugars above 200, call your Primary Care provider for instructions.  Your sugars should get better as the steroid dose is reduced and then off.    BLOOD PRESSURE --- your BP has been running high, and I added a new medication to help lower it (amlodipine, aka Norvasc).  Please check your BP at home and follow up with Primary Care in about one week.  You might not need this new medication in the long term.  Your BP might be higher than usual due to steroids and the hospital environment (stress).     --Dr. Nicole Kindred, DO   [facility]   [facility]   Increase activity slowly   Complete by: As directed    No wound care   Complete by: As directed       Allergies as of 11/06/2021       Reactions   Atorvastatin  Other (See Comments)   Leg aches and weakness        Medication List     STOP taking these medications    Lagevrio 200 MG Caps capsule Generic drug: molnupiravir EUA       TAKE these medications    acetaminophen 500 MG tablet Commonly known as: TYLENOL Take 1,000 mg by mouth daily as needed for moderate pain or headache.   ALPRAZolam 0.5 MG tablet Commonly known as: XANAX Take 0.5 mg by mouth 2 (two) times daily.   amLODipine 10 MG tablet Commonly known as: NORVASC Take 1 tablet (10 mg total) by mouth daily.   aspirin EC 81 MG tablet Take 81 mg by mouth daily.   budesonide 0.5 MG/2ML nebulizer solution Commonly known as: PULMICORT Take 2 mLs (0.5 mg total) by nebulization 2 (two) times daily. Use AFTER Perforomist   busPIRone 10 MG tablet Commonly known as: BUSPAR Take 10 mg by mouth 2 (two) times  daily.   Compressor/Nebulizer Misc Use as directed   esomeprazole 40 MG capsule Commonly known as: NEXIUM Take 40 mg by mouth at bedtime.   ezetimibe 10 MG tablet Commonly known as: ZETIA Take 10 mg by mouth at bedtime.   folic acid 400 MCG tablet Commonly known as: FOLVITE Take 400 mcg by mouth every evening.   formoterol 20 MCG/2ML nebulizer solution Commonly known as: PERFOROMIST Take 2 mLs (20 mcg total) by nebulization 2 (two) times daily.   furosemide 40 MG tablet Commonly known as: LASIX Take 40 mg by mouth 2 (two) times daily.   glipiZIDE 5 MG tablet Commonly known as: GLUCOTROL Take 0.5 tablets (2.5 mg total) by mouth daily before breakfast. What changed:  how much to take when to take this additional instructions   guaiFENesin 600 MG 12 hr tablet Commonly known as: MUCINEX Take 1,200 mg by mouth every 12 (twelve) hours as needed.   losartan 100 MG tablet Commonly known as: COZAAR Take 100 mg by mouth daily.   metFORMIN 1000 MG tablet Commonly known as: GLUCOPHAGE TAKE 1 TABLET BY MOUTH TWICE DAILY WITH A MEAL   metoprolol  succinate 50 MG 24 hr tablet Commonly known as: TOPROL-XL Take 1 tablet (50 mg total) by mouth daily.   predniSONE 10 MG tablet Commonly known as: DELTASONE Take 4 tablets (40 mg total) by mouth daily for 1 day, THEN 3 tablets (30 mg total) daily for 2 days, THEN 2 tablets (20 mg total) daily for 2 days, THEN 1 tablet (10 mg total) daily for 2 days. Start taking on: November 06, 2021   ProAir HFA 108 (90 Base) MCG/ACT inhaler Generic drug: albuterol Inhale 1 puff into the lungs every 4 (four) hours as needed for wheezing or shortness of breath.   albuterol (2.5 MG/3ML) 0.083% nebulizer solution Commonly known as: PROVENTIL Take 3 mLs (2.5 mg total) by nebulization every 6 (six) hours as needed for wheezing or shortness of breath.   revefenacin 175 MCG/3ML nebulizer solution Commonly known as: YUPELRI Take 3 mLs (175 mcg total) by nebulization daily. Can be mixed with Perforomist   traZODone 50 MG tablet Commonly known as: DESYREL Take 50 mg by mouth at bedtime as needed.   Vitamin D (Cholecalciferol) 25 MCG (1000 UT) Tabs Take 1,000 Units by mouth every morning.        Follow-up Information     Stoneking, Hal, MD. Schedule an appointment as soon as possible for a visit in 1 week(s).   Specialty: Internal Medicine Why: Hospital followup - please address blood glucose (on steroids) and blood pressure Contact information: [address] [place] [address] [phone]         Iran Ouch, MD .   Specialty: Cardiology Contact information: [address] [place] [address] [phone]         Salena Saner, MD. Schedule an appointment as soon as possible for a visit in 2 week(s).   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 Contact information: [address] [place] [address] [phone]                Allergies  Allergen Reactions    Atorvastatin Other (See Comments)    Leg aches and weakness      If you experience worsening of your admission symptoms, develop shortness of breath, life threatening emergency, suicidal or homicidal thoughts you must seek medical attention  immediately by calling 911 or calling your MD immediately  if symptoms less severe.    Please note   You were cared for by a hospitalist during your hospital stay. If you have any questions about your discharge medications or the care you received while you were in the hospital after you are discharged, you can call the unit and asked to speak with the hospitalist on call if the hospitalist that took care of you is not available. Once you are discharged, your primary care physician will handle any further medical issues. Please note that NO REFILLS for any discharge medications will be authorized once you are discharged, as it is imperative that you return to your primary care physician (or establish a relationship with a primary care physician if you do not have one) for your aftercare needs so that they can reassess your need for medications and monitor your lab values.   Consultations: PCCM Nephrology    Procedures/Studies: DG Chest 2 View  Result Date: 10/31/2021 CLINICAL DATA:  Shortness of breath EXAM: CHEST - 2 VIEW COMPARISON:  10/04/2021 FINDINGS: Previous median sternotomy and CABG. Previous aortic valve replacement. Heart size upper limits of normal. Chronic aortic atherosclerotic calcification. Mild bronchial thickening but no evidence of infiltrate, collapse or effusion. No significant bone finding. IMPRESSION: Borderline cardiomegaly. Previous CABG and AVR. Bronchial thickening. No consolidation or collapse. Electronically Signed   By: Nelson Chimes M.D.   On: 10/31/2021 14:18   NM Pulmonary Perfusion  Result Date: 11/02/2021 CLINICAL DATA:  PE suspected EXAM: NUCLEAR MEDICINE PERFUSION LUNG SCAN TECHNIQUE: Perfusion images were  obtained in multiple projections after intravenous injection of radiopharmaceutical. Ventilation scans intentionally deferred if perfusion scan and chest x-ray adequate for interpretation during COVID 19 epidemic. RADIOPHARMACEUTICALS:  4.5 mCi Tc-20m MAA IV COMPARISON:  Same-day chest radiographs FINDINGS: Cardiomegaly.  Normal, homogeneous perfusion of the lungs. IMPRESSION: 1. Very low probability examination for pulmonary embolism by modified perfusion only PIOPED criteria (PE absent). 2.  Cardiomegaly. Electronically Signed   By: Delanna Ahmadi M.D.   On: 11/02/2021 11:52   US RENAL  Result Date: 10/31/2021 CLINICAL DATA:  Acute renal injury. EXAM: RENAL / URINARY TRACT ULTRASOUND COMPLETE COMPARISON:  None. FINDINGS: Right Kidney: Renal measurements: 12.1 cm x 6.6 cm x 6.3 cm = volume: 281 mL. Diffusely increased echogenicity of the renal parenchyma is noted. A 6.3 cm x 5.2 cm x 5.3 cm anechoic structure is seen within the upper pole of the right kidney. No abnormal flow is seen within this region on color Doppler evaluation. No hydronephrosis is visualized. Left Kidney: Renal measurements: 12.9 cm x 4.9 cm x 5.3 cm = volume: 174 mL. Diffusely increased echogenicity of the renal parenchyma is noted. A 1.3 cm x 1.5 cm x 1.5 cm anechoic structure is seen within the upper pole of the left kidney. A similar appearing 4.1 cm x 3.5 cm x 3.2 cm anechoic structure is seen within the lower pole of the left kidney. No abnormal flow is seen within these regions on color Doppler evaluation. No hydronephrosis is visualized. Bladder: Appears normal for degree of bladder distention. Other: None. IMPRESSION: 1. Diffusely increased echogenicity of the renal parenchyma, likely secondary to medical renal disease. 2. Bilateral simple renal cysts. Electronically Signed   By: Virgina Norfolk M.D.   On: 10/31/2021 19:58   US Venous Img Lower Bilateral (DVT)  Result Date: 10/31/2021 CLINICAL DATA:  Acute hypercapnic  respiratory failure EXAM: BILATERAL LOWER EXTREMITY VENOUS DOPPLER ULTRASOUND TECHNIQUE: Gray-scale  sonography with graded compression, as well as color Doppler and duplex ultrasound were performed to evaluate the lower extremity deep venous systems from the level of the common femoral vein and including the common femoral, femoral, profunda femoral, popliteal and calf veins including the posterior tibial, peroneal and gastrocnemius veins when visible. The superficial great saphenous vein was also interrogated. Spectral Doppler was utilized to evaluate flow at rest and with distal augmentation maneuvers in the common femoral, femoral and popliteal veins. COMPARISON:  None. FINDINGS: RIGHT LOWER EXTREMITY Common Femoral Vein: No evidence of thrombus. Normal compressibility, respiratory phasicity and response to augmentation. Saphenofemoral Junction: No evidence of thrombus. Normal compressibility and flow on color Doppler imaging. Profunda Femoral Vein: No evidence of thrombus. Normal compressibility and flow on color Doppler imaging. Femoral Vein: No evidence of thrombus. Normal compressibility, respiratory phasicity and response to augmentation. Popliteal Vein: No evidence of thrombus. Normal compressibility, respiratory phasicity and response to augmentation. Calf Veins: No evidence of thrombus. Normal compressibility and flow on color Doppler imaging. Superficial Great Saphenous Vein: No evidence of thrombus. Normal compressibility. Venous Reflux:  None. Other Findings:  None. LEFT LOWER EXTREMITY Common Femoral Vein: No evidence of thrombus. Normal compressibility, respiratory phasicity and response to augmentation. Saphenofemoral Junction: No evidence of thrombus. Normal compressibility and flow on color Doppler imaging. Profunda Femoral Vein: No evidence of thrombus. Normal compressibility and flow on color Doppler imaging. Femoral Vein: No evidence of thrombus. Normal compressibility, respiratory phasicity  and response to augmentation. Popliteal Vein: No evidence of thrombus. Normal compressibility, respiratory phasicity and response to augmentation. Calf Veins: No evidence of thrombus. Normal compressibility and flow on color Doppler imaging. Superficial Great Saphenous Vein: No evidence of thrombus. Normal compressibility. Venous Reflux:  None. Other Findings:  None. IMPRESSION: No evidence of deep venous thrombosis in either lower extremity. Electronically Signed   By: Virgina Norfolk M.D.   On: 10/31/2021 19:55   DG Chest Port 1 View  Result Date: 11/02/2021 CLINICAL DATA:  Shortness of breath and hypoxia. Coronavirus infection. EXAM: PORTABLE CHEST 1 VIEW COMPARISON:  11/01/2021 FINDINGS: Previous median sternotomy, CABG and aortic valve replacement. Chronic cardiomegaly. Chronic interstitial lung markings without evidence of acute infiltrate, collapse or effusion. No significant bone finding. IMPRESSION: Previous CABG and AVR. Cardiomegaly. Chronic pulmonary markings but no sign of acute infiltrate or collapse. Electronically Signed   By: Nelson Chimes M.D.   On: 11/02/2021 09:48   DG Chest Port 1 View  Result Date: 11/01/2021 CLINICAL DATA:  COVID-19 EXAM: PORTABLE CHEST 1 VIEW COMPARISON:  Chest radiograph 10/31/2021 FINDINGS: Median sternotomy wires, mediastinal surgical clips, and aortic valve prosthesis are again noted. The heart is enlarged, unchanged. The mediastinal contours are stable. Mild central peribronchial thickening is again seen. There is no new or worsening focal airspace disease. There is mild left basilar atelectasis. There is no pleural effusion. There is no pneumothorax. The bones are stable. IMPRESSION: Unchanged central peribronchial thickening which may reflect viral infection. Overall, no significant interval change compared to the study from 1 day prior. Electronically Signed   By: Valetta Mole M.D.   On: 11/01/2021 08:02   ECHOCARDIOGRAM COMPLETE  Result Date:  11/01/2021    ECHOCARDIOGRAM REPORT   Patient Name:   Harold Parker Date of Exam: 11/01/2021 Medical Rec #:  [id number]     Height:       65.0 in Accession #:    [id number]    Weight:       175.9 lb Date of Birth:  1946-12-01     BSA:          1.873 m Patient Age:    75 years      BP:           130/81 mmHg Patient Gender: M             HR:           70 bpm. Exam Location:  [facility] Procedure: 2D Echo, Cardiac Doppler and Color Doppler Indications:     Dyspnea R06.00  History:         Patient has prior history of Echocardiogram examinations, most                  recent 02/05/2021. CAD and Previous Myocardial Infarction; COPD.                  S/P Aortic valve replacement --bioprosthetic valve.  Sonographer:     Sherrie Sport Referring Phys:  [place] Diagnosing Phys: Kate Sable MD  Sonographer Comments: Technically difficult study due to poor echo windows, suboptimal apical window and suboptimal parasternal window. IMPRESSIONS  1. Left ventricular ejection fraction, by estimation, is 35 to 40%. The left ventricle has moderately decreased function. Left ventricular endocardial border not optimally defined to evaluate regional wall motion. There is mild left ventricular hypertrophy. Left ventricular diastolic parameters are consistent with Grade I diastolic dysfunction (impaired relaxation).  2. Right ventricular systolic function was not well visualized. The right ventricular size is moderately enlarged.  3. The mitral valve is normal in structure. No evidence of mitral valve regurgitation.  4. The aortic valve was not well visualized. Aortic valve regurgitation is not visualized. FINDINGS  Left Ventricle: Left ventricular ejection fraction, by estimation, is 35 to 40%. The left ventricle has moderately decreased function. Left ventricular endocardial border not optimally defined to evaluate regional wall motion. The left ventricular internal cavity size was normal in size. There is mild left ventricular  hypertrophy. Left ventricular diastolic parameters are consistent with Grade I diastolic dysfunction (impaired relaxation). Right Ventricle: The right ventricular size is moderately enlarged. No increase in right ventricular wall thickness. Right ventricular systolic function was not well visualized. Left Atrium: Left atrial size was normal in size. Right Atrium: Right atrial size was not well visualized. Pericardium: There is no evidence of pericardial effusion. Mitral Valve: The mitral valve is normal in structure. No evidence of mitral valve regurgitation. Tricuspid Valve: The tricuspid valve is not well visualized. Tricuspid valve regurgitation is not demonstrated. Aortic Valve: The aortic valve was not well visualized. Aortic valve regurgitation is not visualized. Aortic valve mean gradient measures 1.0 mmHg. Aortic valve peak gradient measures 2.3 mmHg. Aortic valve area, by VTI measures 2.85 cm. Pulmonic Valve: The pulmonic valve was not well visualized. Pulmonic valve regurgitation is not visualized. Aorta: The aortic root was not well visualized. Venous: The inferior vena cava was not well visualized. IAS/Shunts: No atrial level shunt detected by color flow Doppler.  LEFT VENTRICLE PLAX 2D LVIDd:         4.53 cm   Diastology LVIDs:         3.13 cm   LV e' medial:    5.44 cm/s LV PW:         1.05 cm   LV E/e' medial:  10.3 LV IVS:        1.30 cm   LV e' lateral:   6.96 cm/s LVOT diam:     2.00 cm  LV E/e' lateral: 8.1 LV SV:         37 LV SV Index:   20 LVOT Area:     3.14 cm  RIGHT VENTRICLE RV Basal diam:  4.83 cm RV S prime:     6.85 cm/s TAPSE (M-mode): 3.8 cm LEFT ATRIUM         Index LA diam:    4.30 cm 2.30 cm/m  AORTIC VALVE AV Area (Vmax):    2.29 cm AV Area (Vmean):   2.12 cm AV Area (VTI):     2.85 cm AV Vmax:           76.00 cm/s AV Vmean:          49.400 cm/s AV VTI:            0.129 m AV Peak Grad:      2.3 mmHg AV Mean Grad:      1.0 mmHg LVOT Vmax:         55.50 cm/s LVOT Vmean:         33.400 cm/s LVOT VTI:          0.117 m LVOT/AV VTI ratio: 0.91  AORTA Ao Root diam: 3.10 cm MITRAL VALVE               TRICUSPID VALVE MV Area (PHT): 3.61 cm    TR Peak grad:   41.7 mmHg MV Decel Time: 210 msec    TR Vmax:        323.00 cm/s MV E velocity: 56.10 cm/s MV A velocity: 78.00 cm/s  SHUNTS MV E/A ratio:  0.72        Systemic VTI:  0.12 m                            Systemic Diam: 2.00 cm Kate Sable MD Electronically signed by Kate Sable MD Signature Date/Time: 11/01/2021/3:49:37 PM    Final     Echo   Subjective: Pt reports feeling well.  No SOB at rest.  No chest pain, wheezing.  Eager to go home.  We discussed his blood sugars still high on steroids.  I offered to send insulin prescription, but pt declined. He agreed to watch his sugars closely and call primary care if not controlled.   Discharge Exam: Vitals:   11/06/21 0414 11/06/21 0743  BP: (!) 150/76 (!) 175/65  Pulse: (!) 57 (!) 57  Resp: 16 16  Temp: 98 F (36.7 C) 98.8 F (37.1 C)  SpO2: 96% 97%   Vitals:   11/05/21 2329 11/06/21 0414 11/06/21 0500 11/06/21 0743  BP: (!) 151/62 (!) 150/76  (!) 175/65  Pulse: (!) 57 (!) 57  (!) 57  Resp: 18 16  16   Temp: 97.9 F (36.6 C) 98 F (36.7 C)  98.8 F (37.1 C)  TempSrc: Oral Oral    SpO2: 94% 96%  97%  Weight:   83.8 kg   Height:        General: Pt is alert, awake, not in acute distress Cardiovascular: RRR, S1/S2 +, no rubs, no gallops Respiratory: CTA bilaterally, no wheezing, no rhonchi, on 4 L/min [place] O2,  Abdominal: Soft, NT, ND, bowel sounds + Extremities: no edema, no cyanosis    The results of significant diagnostics from this hospitalization (including imaging, microbiology, ancillary and laboratory) are listed below for reference.     Microbiology: Recent Results (from the past 240 hour(s))  Resp Panel by RT-PCR (  Flu A&B, Covid) Nasopharyngeal Swab     Status: Abnormal   Collection Time: 10/31/21  2:39 PM   Specimen: Nasopharyngeal  Swab; Nasopharyngeal(NP) swabs in vial transport medium  Result Value Ref Range Status   SARS Coronavirus 2 by RT PCR POSITIVE (A) NEGATIVE Corrected    Comment: READ BACK AND VERIFIED BY STEPHANIE RUDD, RN AT 1606 10/31/21 BY [facility] RESULT CALLED TO, READ BACK BY AND VERIFIED WITH: (NOTE) SARS-CoV-2 target nucleic acids are DETECTED.  The SARS-CoV-2 RNA is generally detectable in upper respiratory specimens during the acute phase of infection. Positive results are indicative of the presence of the identified virus, but do not rule out bacterial infection or co-infection with other pathogens not detected by the test. Clinical correlation with patient history and other diagnostic information is necessary to determine patient infection status. The expected result is Negative.  Fact Sheet for Patients: EntrepreneurPulse.com.au  Fact Sheet for Healthcare Providers: IncredibleEmployment.be  This test is not yet approved or cleared by the [place] FDA and  has been authorized for detection and/or diagnosis of SARS-CoV-2 by FDA under an Emergency Use Authorization (EUA).  This EUA will remain  in effect (meaning this test can be used) for the duration of  the COVID-19 declaration under Section 564(b)(1) of the Act, 21 U.S.C. section 360bbb-3(b)(1), unless the authorization is terminated or revoked sooner.  CORRECTED ON 11/16 AT 0800: PREVIOUSLY REPORTED AS POSITIVE READ BACK AND VERIFIED BY STEPHANIE RUDD, RN AT [id number] 10/31/21 BY [facility]    Influenza A by PCR NEGATIVE NEGATIVE Final   Influenza B by PCR NEGATIVE NEGATIVE Final    Comment: (NOTE) The Xpert Xpress SARS-CoV-2/FLU/RSV plus assay is intended as an aid in the diagnosis of influenza from Nasopharyngeal swab specimens and should not be used as a sole basis for treatment. Nasal washings and aspirates are unacceptable for Xpert Xpress SARS-CoV-2/FLU/RSV testing.  Fact Sheet for  Patients: EntrepreneurPulse.com.au  Fact Sheet for Healthcare Providers: IncredibleEmployment.be  This test is not yet approved or cleared by the [place] FDA and has been authorized for detection and/or diagnosis of SARS-CoV-2 by FDA under an Emergency Use Authorization (EUA). This EUA will remain in effect (meaning this test can be used) for the duration of the COVID-19 declaration under Section 564(b)(1) of the Act, 21 U.S.C. section 360bbb-3(b)(1), unless the authorization is terminated or revoked.  Performed at [facility], [address] [place], [place] [address]      Labs: BNP (last 3 results) Recent Labs    02/09/21 0642 10/31/21 1335 11/04/21 0355  BNP 196.8* 768.5* 1,587.5*   Basic Metabolic Panel: Recent Labs  Lab 11/01/21 0258 11/02/21 0536 11/03/21 0636 11/04/21 0355 11/05/21 0350 11/06/21 0332  NA 133* 137 142 141 144 144  K 6.0* 5.2* 4.8 4.8 4.2 4.4  CL 103 107 109 107 108 107  CO2 22 24 26 28  32 33*  GLUCOSE 244* 292* 264* 208* 151* 94  BUN 72* 67* 60* 53* 46* 41*  CREATININE 2.00* 1.36* 1.23 1.13 1.03 1.09  CALCIUM 8.0* 8.5* 8.6* 9.0 9.0 9.2  MG 2.2  --  2.0  --   --  1.8  PHOS 3.5  --   --   --   --   --    Liver Function Tests: Recent Labs  Lab 10/31/21 1435  AST 26  ALT 58*  ALKPHOS 112  BILITOT 1.0  PROT 7.1  ALBUMIN 3.9   No results for input(s): LIPASE, AMYLASE in the  last 168 hours. No results for input(s): AMMONIA in the last 168 hours. CBC: Recent Labs  Lab 10/31/21 1335 11/01/21 0258 11/02/21 0536 11/03/21 0636  WBC 9.6 6.7 9.6 9.7  NEUTROABS 6.6  --   --   --   HGB 17.4* 15.1 15.0 13.9  HCT 51.8 43.4 42.8 40.5  MCV 91.8 89.5 88.8 89.0  PLT 235 171 198 190   Cardiac Enzymes: Recent Labs  Lab 11/01/21 0913  CKTOTAL 69   BNP: Invalid input(s): POCBNP CBG: Recent Labs  Lab 11/05/21 1116 11/05/21 1603 11/05/21 1933 11/05/21 2104 11/06/21 0742  GLUCAP  155* 319* 348* 216* 132*   D-Dimer No results for input(s): DDIMER in the last 72 hours. Hgb A1c No results for input(s): HGBA1C in the last 72 hours. Lipid Profile No results for input(s): CHOL, HDL, LDLCALC, TRIG, CHOLHDL, LDLDIRECT in the last 72 hours. Thyroid function studies No results for input(s): TSH, T4TOTAL, T3FREE, THYROIDAB in the last 72 hours.  Invalid input(s): FREET3 Anemia work up No results for input(s): VITAMINB12, FOLATE, FERRITIN, TIBC, IRON, RETICCTPCT in the last 72 hours. Urinalysis    Component Value Date/Time   COLORURINE YELLOW (A) 11/01/2021 0931   APPEARANCEUR HAZY (A) 11/01/2021 0931   LABSPEC 1.017 11/01/2021 0931   PHURINE 5.0 11/01/2021 0931   GLUCOSEU 150 (A) 11/01/2021 0931   HGBUR NEGATIVE 11/01/2021 0931   BILIRUBINUR NEGATIVE 11/01/2021 0931   KETONESUR 5 (A) 11/01/2021 0931   PROTEINUR 100 (A) 11/01/2021 0931   NITRITE NEGATIVE 11/01/2021 0931   LEUKOCYTESUR NEGATIVE 11/01/2021 0931   Sepsis Labs Invalid input(s): PROCALCITONIN,  WBC,  LACTICIDVEN Microbiology Recent Results (from the past 240 hour(s))  Resp Panel by RT-PCR (Flu A&B, Covid) Nasopharyngeal Swab     Status: Abnormal   Collection Time: 10/31/21  2:39 PM   Specimen: Nasopharyngeal Swab; Nasopharyngeal(NP) swabs in vial transport medium  Result Value Ref Range Status   SARS Coronavirus 2 by RT PCR POSITIVE (A) NEGATIVE Corrected    Comment: READ BACK AND VERIFIED BY STEPHANIE RUDD, RN AT 1606 10/31/21 BY [facility] RESULT CALLED TO, READ BACK BY AND VERIFIED WITH: (NOTE) SARS-CoV-2 target nucleic acids are DETECTED.  The SARS-CoV-2 RNA is generally detectable in upper respiratory specimens during the acute phase of infection. Positive results are indicative of the presence of the identified virus, but do not rule out bacterial infection or co-infection with other pathogens not detected by the test. Clinical correlation with patient history and other diagnostic information is  necessary to determine patient infection status. The expected result is Negative.  Fact Sheet for Patients: EntrepreneurPulse.com.au  Fact Sheet for Healthcare Providers: IncredibleEmployment.be  This test is not yet approved or cleared by the [place] FDA and  has been authorized for detection and/or diagnosis of SARS-CoV-2 by FDA under an Emergency Use Authorization (EUA).  This EUA will remain  in effect (meaning this test can be used) for the duration of  the COVID-19 declaration under Section 564(b)(1) of the Act, 21 U.S.C. section 360bbb-3(b)(1), unless the authorization is terminated or revoked sooner.  CORRECTED ON 11/16 AT 0800: PREVIOUSLY REPORTED AS POSITIVE READ BACK AND VERIFIED BY STEPHANIE RUDD, RN AT [id number] 10/31/21 BY [facility]    Influenza A by PCR NEGATIVE NEGATIVE Final   Influenza B by PCR NEGATIVE NEGATIVE Final    Comment: (NOTE) The Xpert Xpress SARS-CoV-2/FLU/RSV plus assay is intended as an aid in the diagnosis of influenza from Nasopharyngeal swab specimens and should not be used as a  sole basis for treatment. Nasal washings and aspirates are unacceptable for Xpert Xpress SARS-CoV-2/FLU/RSV testing.  Fact Sheet for Patients: EntrepreneurPulse.com.au  Fact Sheet for Healthcare Providers: IncredibleEmployment.be  This test is not yet approved or cleared by the [place] FDA and has been authorized for detection and/or diagnosis of SARS-CoV-2 by FDA under an Emergency Use Authorization (EUA). This EUA will remain in effect (meaning this test can be used) for the duration of the COVID-19 declaration under Section 564(b)(1) of the Act, 21 U.S.C. section 360bbb-3(b)(1), unless the authorization is terminated or revoked.  Performed at [facility], [address] [place], [place] [address]      Time coordinating discharge: Over 30 minutes  SIGNED:   Ezekiel Slocumb, DO [facility] 11/06/2021, 9:32 AM   If 7PM-7AM, please contact night-coverage [email or web address]

## 2021-11-20 LAB — BLOOD GAS, VENOUS
Acid-base deficit: 4.6 mmol/L — ABNORMAL HIGH (ref 0.0–2.0)
Bicarbonate: 21.7 mmol/L (ref 20.0–28.0)
O2 Saturation: 6.7 %
Patient temperature: 37
pCO2, Ven: 43 mmHg — ABNORMAL LOW (ref 44.0–60.0)
pH, Ven: 7.31 (ref 7.250–7.430)

## 2021-11-21 ENCOUNTER — Other Ambulatory Visit: Payer: Self-pay

## 2021-11-21 ENCOUNTER — Encounter: Payer: Self-pay | Admitting: Pulmonary Disease

## 2021-11-21 ENCOUNTER — Ambulatory Visit (INDEPENDENT_AMBULATORY_CARE_PROVIDER_SITE_OTHER): Payer: Medicare Other | Admitting: Pulmonary Disease

## 2021-11-21 VITALS — BP 136/60 | HR 81 | Temp 97.1°F | Ht 65.0 in | Wt 175.2 lb

## 2021-11-21 DIAGNOSIS — J449 Chronic obstructive pulmonary disease, unspecified: Secondary | ICD-10-CM | POA: Diagnosis not present

## 2021-11-21 DIAGNOSIS — J9611 Chronic respiratory failure with hypoxia: Secondary | ICD-10-CM | POA: Diagnosis not present

## 2021-11-21 DIAGNOSIS — I5022 Chronic systolic (congestive) heart failure: Secondary | ICD-10-CM

## 2021-11-21 DIAGNOSIS — Z87891 Personal history of nicotine dependence: Secondary | ICD-10-CM

## 2021-11-21 NOTE — Patient Instructions (Signed)
Using Perforomist, budesonide and Yupelri on your nebulizer as prior.  We will see you in follow-up in 2 months time we will get breathing test prior to your next appointment.  Call sooner should any new problems arise.

## 2021-11-21 NOTE — Progress Notes (Signed)
Subjective:    Patient ID: Harold Parker, male    DOB: 1946-04-19, 75 y.o.   MRN: [id number] Chief Complaint  Patient presents with   Follow-up    Recent admission for covid--sob with exertion and occ chills.     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  This is a scheduled visit.  Since his last visit of 15 August 2021 he had another admission to [facility] for issues related to acute renal failure and hyperkalemia in the setting of COVID-19 infection he was admitted on 15 November and discharged on 21 November.  He had called on 29 October 2021 having tested positive for COVID-19 he was prescribed molnupiravir felt better for 1 day and then had the difficulties with fatigue noted to be in acute renal failure and admitted to the hospital.  A VQ scan during that hospitalization that showed no evidence of PE, his oxygen requirements did increase to 4 L/min, he presents today on 4 L/min nasal cannula O2.  He has completed prednisone taper.  Did notice some fatigue after the prednisone taper was completed but now feels back to normal.  Etiology of the renal failure was likely due to ATN from hypoxia.  The patient notes also that his breathing gets very compromised when he gets very anxious we discussed pursed lip breathing techniques as he was taught during rehab.  Today he feels that he is almost back to baseline.  He has not had any fevers, chills or sweats since his COVID-19 diagnosis.  He does endorse feeling dizzy when he bends over but he does not experience increased dyspnea with this.  He is to see cardiology in a few days.  He does have issues with chronic systolic heart failure.  2D echo performed on 16 November showed that his EF is stable at 35 to 40%.  He does have significant Cor pulmonale as well.   Review of Systems A 10 point review of systems was  performed and it is as noted above otherwise negative.  Patient Active Problem List   Diagnosis Date Noted   Pressure injury of skin 0000000   Acute metabolic acidosis 0000000   COVID-19 10/31/2021   Chronic respiratory failure with hypoxia (HCC) 05/02/2021   Insomnia    History of COVID-19 pneumonia 01/11/21 02/04/2021   Pulmonary nodules    Acute respiratory disease due to COVID-19 virus 01/11/2021   HTN (hypertension)    Chronic systolic CHF (congestive heart failure) (HCC)    COPD (chronic obstructive pulmonary disease) (HCC)    GERD (gastroesophageal reflux disease)    Anxiety    AKI (acute kidney injury) ([place])    Hyperkalemia    Elevated troponin    Acute hypoxemic respiratory failure due to COVID-19 [facility])    Melena    Duodenal ulceration    Gastrointestinal hemorrhage    Severe anemia 03/14/2018   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   S/P aortic valve replacement with bioprosthetic valve  02/19/2018   Preoperative respiratory examination 02/13/2018   Acute on chronic respiratory failure with hypoxia ([place]) 02/11/2018   Influenza with respiratory manifestation 02/11/2018   Stage 3 severe COPD by GOLD classification ([place])    Aortic stenosis    Bronchitis, chronic obstructive, with exacerbation (HCC)    Tobacco abuse    CAD (coronary artery disease) [id number]   Acute systolic CHF (congestive  heart failure) (HCC) 02/07/2018   Ischemic cardiomyopathy 02/07/2018   Type 2 diabetes mellitus with hyperglycemia, without long-term current use of insulin (HCC) 02/07/2018   Hyperlipidemia LDL goal <70 02/07/2018   COPD with acute exacerbation (HCC) 02/05/2018   NSTEMI (non-ST elevated myocardial infarction) (HCC) 02/05/2018   Social History   Tobacco Use   Smoking status: Former    Packs/day: 1.00    Years: 40.00    Pack years: 40.00    Types: Cigarettes    Quit date: 02/02/2018    Years since quitting: 3.8   Smokeless tobacco: Never   Tobacco comments:    Quit 01/2018 - on  05/01/2018 talked to Keni about [place] concerns. He ststaes that he has no taste for tobacco since he quit in Feb.  Substance Use Topics   Alcohol use: Yes    Comment: occ   Allergies  Allergen Reactions   Atorvastatin Other (See Comments)    Leg aches and weakness    Current Meds  Medication Sig   acetaminophen (TYLENOL) 500 MG tablet Take 1,000 mg by mouth daily as needed for moderate pain or headache.   albuterol (PROVENTIL) (2.5 MG/3ML) 0.083% nebulizer solution Take 3 mLs (2.5 mg total) by nebulization every 6 (six) hours as needed for wheezing or shortness of breath.   ALPRAZolam (XANAX) 0.5 MG tablet Take 0.5 mg by mouth 2 (two) times daily.   amLODipine (NORVASC) 10 MG tablet Take 1 tablet (10 mg total) by mouth daily.   aspirin EC 81 MG tablet Take 81 mg by mouth daily.   budesonide (PULMICORT) 0.5 MG/2ML nebulizer solution Take 2 mLs (0.5 mg total) by nebulization 2 (two) times daily. Use AFTER Perforomist   busPIRone (BUSPAR) 10 MG tablet Take 10 mg by mouth 2 (two) times daily.   esomeprazole (NEXIUM) 40 MG capsule Take 40 mg by mouth at bedtime.    ezetimibe (ZETIA) 10 MG tablet Take 10 mg by mouth at bedtime.    formoterol (PERFOROMIST) 20 MCG/2ML nebulizer solution Take 2 mLs (20 mcg total) by nebulization 2 (two) times daily.   furosemide (LASIX) 40 MG tablet Take 40 mg by mouth 2 (two) times daily.   glipiZIDE (GLUCOTROL) 5 MG tablet Take 0.5 tablets (2.5 mg total) by mouth daily before breakfast. (Patient taking differently: Take 5 mg by mouth See admin instructions. Take 5 mg in the morning and take a second 5 mg dose at night on Mon, Wed, and Fri)   guaiFENesin (MUCINEX) 600 MG 12 hr tablet Take 1,200 mg by mouth every 12 (twelve) hours as needed.   losartan (COZAAR) 100 MG tablet Take 100 mg by mouth daily.   metFORMIN (GLUCOPHAGE) 1000 MG tablet TAKE 1 TABLET BY MOUTH TWICE DAILY WITH A MEAL   metoprolol succinate (TOPROL-XL) 50 MG 24 hr tablet Take 1 tablet (50 mg  total) by mouth daily.   Nebulizers (COMPRESSOR/NEBULIZER) MISC Use as directed   PROAIR HFA 108 (90 Base) MCG/ACT inhaler Inhale 1 puff into the lungs every 4 (four) hours as needed for wheezing or shortness of breath.   revefenacin (YUPELRI) 175 MCG/3ML nebulizer solution Take 3 mLs (175 mcg total) by nebulization daily. Can be mixed with Perforomist   traZODone (DESYREL) 50 MG tablet Take 50 mg by mouth at bedtime as needed.   Vitamin D, Cholecalciferol, 1000 units TABS Take 1,000 Units by mouth every morning.    [DISCONTINUED] folic acid (FOLVITE) 400 MCG tablet Take 400 mcg by mouth every evening.  Immunization History  Administered Date(s) Administered   Influenza Inj Mdck Quad Pf 12/02/2017   Influenza Split 12/11/2014, 11/01/2019   Influenza, High Dose Seasonal PF 09/16/2018, 08/21/2019, 11/01/2020   Influenza-Unspecified 09/18/2021   PFIZER(Purple Top)SARS-COV-2 Vaccination 02/06/2020, 02/29/2020       Objective:   Physical Exam BP 136/60 (BP Location: Left Arm, Cuff Size: Normal)   Pulse 81   Temp (!) 97.1 F (36.2 C) (Oral)   Ht 5\' 5"  (1.651 m)   Wt 175 lb 3.2 oz (79.5 kg)   SpO2 95%   BMI 29.15 kg/m  GENERAL: Chronically ill-appearing man, well nourished, well developed. No conversational dyspnea. Comfortable with nasal cannula O2 at 4 L/min, sats 95%.   HEAD: Normocephalic, atraumatic.  EYES: Pupils equal, round, reactive to light.  No scleral icterus.  MOUTH: Oral mucosa moist.  No thrush. NECK: Supple. No thyromegaly. Trachea midline. No JVD.  No adenopathy. PULMONARY: Distant breath sounds.  Coarse breath sounds with no other adventitious sounds. CARDIOVASCULAR: S1 and S2. Regular rate and rhythm.  1/6 systolic murmur at the lower left sternal border. ABDOMEN: Benign. MUSCULOSKELETAL: No joint deformity, no clubbing, no edema.  NEUROLOGIC: No overt focal deficit, speech is fluent. SKIN: Intact,warm,dry.  On limited exam no rashes. PSYCH: Mood and behavior  normal.    Assessment & Plan:     ICD-10-CM   1. Stage 3 severe COPD by GOLD classification ([place])  J44.9 Pulmonary Function Test [facility] Only   Reassess with PFTs prior to return to clinic Continue Perforomist, budesonide and Yupelri Follow-up in 2 months time    2. Chronic respiratory failure with hypoxia (HCC)  J96.11    Continue oxygen supplementation Try to wean to 3 L/min    3. Chronic systolic CHF (congestive heart failure) (HCC)  I50.22    To see cardiology in the next few days This issue adds complexity to his management    4. Former heavy tobacco smoker  Z87.891    No evidence of relapse     Orders Placed This Encounter  Procedures   Pulmonary Function Test [facility] Only    Standing Status:   Future    Standing Expiration Date:   11/21/2022    Scheduling Instructions:     1-75mo    Order Specific Question:   Full PFT: includes the following: basic spirometry, spirometry pre & post bronchodilator, diffusion capacity (DLCO), lung volumes    Answer:   Full PFT   He appears to be getting close to baseline.  Instructed on how to wean down to his prior oxygen use.  Reassess with PFTs prior to follow-up appointment.  Follow-up appointment in 2 months he is to contact us prior to that time should any new difficulties arise.   Renold Don, MD Advanced Bronchoscopy [facility]    *This note was dictated using voice recognition software/Dragon.  Despite best efforts to proofread, errors can occur which can change the meaning.  Any change was purely unintentional.

## 2021-11-22 ENCOUNTER — Other Ambulatory Visit: Payer: Self-pay | Admitting: Cardiovascular Disease

## 2021-11-24 ENCOUNTER — Encounter: Payer: Self-pay | Admitting: Cardiovascular Disease

## 2021-11-24 ENCOUNTER — Ambulatory Visit (INDEPENDENT_AMBULATORY_CARE_PROVIDER_SITE_OTHER): Payer: Medicare Other | Admitting: Cardiovascular Disease

## 2021-11-24 ENCOUNTER — Other Ambulatory Visit: Payer: Self-pay

## 2021-11-24 VITALS — BP 140/60 | HR 85 | Ht 65.0 in | Wt 174.0 lb

## 2021-11-24 DIAGNOSIS — Z953 Presence of xenogenic heart valve: Secondary | ICD-10-CM | POA: Diagnosis not present

## 2021-11-24 DIAGNOSIS — I5022 Chronic systolic (congestive) heart failure: Secondary | ICD-10-CM

## 2021-11-24 DIAGNOSIS — I739 Peripheral vascular disease, unspecified: Secondary | ICD-10-CM

## 2021-11-24 DIAGNOSIS — I10 Essential (primary) hypertension: Secondary | ICD-10-CM

## 2021-11-24 DIAGNOSIS — I2510 Atherosclerotic heart disease of native coronary artery without angina pectoris: Secondary | ICD-10-CM

## 2021-11-24 DIAGNOSIS — E785 Hyperlipidemia, unspecified: Secondary | ICD-10-CM

## 2021-11-24 DIAGNOSIS — J449 Chronic obstructive pulmonary disease, unspecified: Secondary | ICD-10-CM

## 2021-11-24 MED ORDER — ROSUVASTATIN CALCIUM 10 MG PO TABS: 10.0000 mg | ORAL_TABLET | Freq: Every day | ORAL | 1 refills | Status: DC

## 2021-11-24 NOTE — Progress Notes (Signed)
Cardiology Office Note   Date:  11/24/2021   ID:  Harold Parker, DOB [birth year], MRN [id number]  PCP:  Merlene Laughter, MD  Cardiologist:   [name], MD  Chief Complaint  Patient presents with   Other    6 month f/u c/o sob, body weakness, cold and chills. Meds reviewed verbally with pt.       History of Present Illness: Harold Parker is a 75 y.o. male who presents for a follow-up visit regarding peripheral arterial disease, coronary artery disease and aortic stenosis status post CABG and bioprosthetic aortic valve in March 2019.  Other medical problems include essential hypertension, hyperlipidemia, type 2 diabetes, COPD and previous tobacco use.   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   He presented in February of 2019 with non-ST elevation myocardial infarction.  Cardiac catheterization revealed severe left main stenosis in addition to significant disease involving OM1 and RCA.  EF was 40 to 45% with moderate aortic stenosis.  The patient underwent CABG and aortic valve replacement. He had postoperative atrial fibrillation that was controlled with amiodarone.  He was discharged home on aspirin and Plavix.  He presented back with an upper GI bleed with a hemoglobin 4.7.  EGD showed duodenal ulcers.   The patient is known to have bilateral leg claudication.  Angiography in March 2020 showed no significant aortoiliac disease.  On the right, there was long heavily calcified occlusion of the SFA with very well-developed collaterals from the profunda and three-vessel runoff below the knee.  On the left, there was heavily calcified disease affecting the left SFA with three-vessel runoff below the knee.  I performed successful orbital atherectomy and drug-coated balloon angioplasty to the left SFA.  He was hospitalized in February of this year with COVID-19 pneumonia.  He was treated with steroids and remdesivir.  His oxygen requirement  increased to 4 L/min.  He was rehospitalized the same month with continued shortness of breath.  He was again treated with steroids.  Oxygen could not be decreased to below 4 L and it was established that this is his new baseline after COVID-19 infection. He had an echocardiogram done during his hospitalization which showed an EF of 40 to 45%, moderate pulmonary hypertension with estimated RV systolic pressure of 58 mmHg and mild mitral regurgitation.  He was hospitalized last month with COVID-19 pneumonia complicated by respiratory failure and acute kidney injury with hyperkalemia.  He had repeat echocardiogram done which showed an EF of 35 to 40% with moderately enlarged right ventricle.  Work-up for pulmonary embolism and DVT was negative.  He has been doing reasonably well with gradual improvement in shortness of breath.  No chest pain.  No lower extremity claudication.  He believes that he was significantly hyperkalemic during most recent hospitalization due to consumption of a drink with high content of potassium.   Past Medical History:  Diagnosis Date   Bilateral carotid bruits    a. 01/2018 U/S: < 50% bilat ICA stenosis.   CAD (coronary artery disease)    a. 1998 s/p MI and BMS [facility], [place]); b. 1999 redo PCI/rotablator in setting of what sounds like ISR;  c. Multiple stress tests over the years - last ~ 2017, reportedly nl; d. 01/2018 NSTEMI/Cath: LM 35m/d, LAD 50p, 40p/m, D1 60ost, OM1 95, RCA 100ost/p w/ L->R collats, EF 45%; e. s/p 3V CABG 02/19/18 (LIMA-LAD, VG-D1, VG-OM)   Chronic lower back pain  COPD (chronic obstructive pulmonary disease) (HCC)    GIB (gastrointestinal bleeding)    a. 02/2018 GIB and anemia w/ Hgb of 4.7 on presentation; b. 03/2018 EGD: 2 nonbleeding duodenal ulcers.   HTN (hypertension)    Hypercholesteremia    Ischemic cardiomyopathy    a. 01/2018 Echo: EF 40-45%, mid-apicalanteroseptal, ant, apical sev HK, mod apicalinferior HK. Gr2 DD. Mod AS, mild MR, mod  dil LA, PASP .   Moderate aortic stenosis    a. 01/2018 Echo: Mod AS, mean grad (S) , Valve area (VTI) 1.06 cm^2, (Vmax) 1.27 cm^2; b. 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   Myocardial infarction [facility]) ~ 1998/1999   S/P aortic valve replacement with bioprosthetic valve 02/19/2018   a. 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   LIMA to LAD, SVG to D1, SVG to OM, EVH via right thigh and leg   Tobacco abuse    Type II diabetes mellitus (HCC)     Past Surgical History:  Procedure Laterality Date   ABDOMINAL AORTOGRAM N/A 03/04/2019   Procedure: ABDOMINAL AORTOGRAM;  Surgeon: Iran Ouch, MD;  Location: [facility] INVASIVE CV LAB;  Service: Cardiovascular;  Laterality: N/A;   AORTIC VALVE REPLACEMENT N/A 02/19/2018   Procedure: AORTIC VALVE REPLACEMENT (AVR);  Surgeon: Purcell Nails, MD;  Location[facility] OR;  Service: Open Heart Surgery;  Laterality: N/A;   COLONOSCOPY     CORONARY ANGIOPLASTY WITH STENT PLACEMENT  ~ 1998/1999   CORONARY ARTERY BYPASS GRAFT N/A 02/19/2018   Procedure: CORONARY ARTERY BYPASS GRAFTING (CABG) x three , using left internal mammary artery and right leg greater saphenous vein harvested endoscopically;  Surgeon: Purcell Nails, MD;  Location: [facility] OR;  Service: Open Heart Surgery;  Laterality: N/A;   ESOPHAGOGASTRODUODENOSCOPY (EGD) WITH PROPOFOL N/A 03/18/2018   Procedure: ESOPHAGOGASTRODUODENOSCOPY (EGD) WITH PROPOFOL;  Surgeon: Midge Minium, MD;  Location: [facility] ENDOSCOPY;  Service: Endoscopy;  Laterality: N/A;   LEFT HEART CATH AND CORONARY ANGIOGRAPHY N/A 02/06/2018   Procedure: LEFT HEART CATH AND CORONARY ANGIOGRAPHY;  Surgeon: Iran Ouch, MD;  Location: [facility] INVASIVE CV LAB;  Service: Cardiovascular;  Laterality: N/A;   LOWER EXTREMITY ANGIOGRAPHY Bilateral 03/04/2019   Procedure: Lower Extremity Angiography;  Surgeon: Iran Ouch, MD;  Location: [facility] INVASIVE CV LAB;  Service:  Cardiovascular;  Laterality: Bilateral;   PERIPHERAL VASCULAR ATHERECTOMY Left 03/04/2019   Procedure: PERIPHERAL VASCULAR ATHERECTOMY;  Surgeon: Iran Ouch, MD;  Location: [facility] INVASIVE CV LAB;  Service: Cardiovascular;  Laterality: Left;  SFA   TEE WITHOUT CARDIOVERSION N/A 02/19/2018   Procedure: TRANSESOPHAGEAL ECHOCARDIOGRAM (TEE);  Surgeon: Purcell Nails, MD;  Location[facility] OR;  Service: Open Heart Surgery;  Laterality: N/A;   TONSILLECTOMY       Current Outpatient Medications  Medication Sig Dispense Refill   acetaminophen (TYLENOL) 500 MG tablet Take 1,000 mg by mouth daily as needed for moderate pain or headache.     albuterol (PROVENTIL) (2.5 MG/3ML) 0.083% nebulizer solution Take 3 mLs (2.5 mg total) by nebulization every 6 (six) hours as needed for wheezing or shortness of breath. 75 mL 12   ALPRAZolam (XANAX) 0.5 MG tablet Take 0.5 mg by mouth 2 (two) times daily.     amLODipine (NORVASC) 10 MG tablet Take 1 tablet (10 mg total) by mouth daily. 30 tablet 1   aspirin EC 81 MG tablet Take 81 mg by mouth daily.     budesonide (PULMICORT) 0.5  MG/2ML nebulizer solution Take 2 mLs (0.5 mg total) by nebulization 2 (two) times daily. Use AFTER Perforomist 120 mL 11   busPIRone (BUSPAR) 10 MG tablet Take 10 mg by mouth 2 (two) times daily.     esomeprazole (NEXIUM) 40 MG capsule Take 40 mg by mouth at bedtime.      ezetimibe (ZETIA) 10 MG tablet Take 10 mg by mouth at bedtime.      formoterol (PERFOROMIST) 20 MCG/2ML nebulizer solution Take 2 mLs (20 mcg total) by nebulization 2 (two) times daily. 120 mL 11   furosemide (LASIX) 40 MG tablet Take 1 tablet (40 mg total) by mouth 2 (two) times daily. (Patient taking differently: Take 40 mg by mouth daily.) 180 tablet 1   glipiZIDE (GLUCOTROL) 5 MG tablet Take 0.5 tablets (2.5 mg total) by mouth daily before breakfast. (Patient taking differently: Take 5 mg by mouth 2 (two) times daily before a meal. Take 5 mg in the morning and take a  second 5 mg dose at night on Mon, Wed, and Fri) 30 tablet 1   guaiFENesin (MUCINEX) 600 MG 12 hr tablet Take 1,200 mg by mouth every 12 (twelve) hours as needed.     losartan (COZAAR) 100 MG tablet Take 100 mg by mouth daily.     metFORMIN (GLUCOPHAGE) 1000 MG tablet TAKE 1 TABLET BY MOUTH TWICE DAILY WITH A MEAL 60 tablet 0   metoprolol succinate (TOPROL-XL) 50 MG 24 hr tablet Take 1 tablet (50 mg total) by mouth daily. 90 tablet 2   Nebulizers (COMPRESSOR/NEBULIZER) MISC Use as directed 1 each 0   PROAIR HFA 108 (90 Base) MCG/ACT inhaler Inhale 1 puff into the lungs every 4 (four) hours as needed for wheezing or shortness of breath.     revefenacin (YUPELRI) 175 MCG/3ML nebulizer solution Take 3 mLs (175 mcg total) by nebulization daily. Can be mixed with Perforomist 90 mL 11   rosuvastatin (CRESTOR) 10 MG tablet Take 1 tablet (10 mg total) by mouth daily. 90 tablet 1   traZODone (DESYREL) 50 MG tablet Take 50 mg by mouth at bedtime as needed.     Vitamin D, Cholecalciferol, 1000 units TABS Take 1,000 Units by mouth every morning.      No current facility-administered medications for this visit.    Allergies:   Atorvastatin    Social History:  The patient  reports that he quit smoking about 3 years ago. His smoking use included cigarettes. He has a 40.00 pack-year smoking history. He has never used smokeless tobacco. He reports current alcohol use. He reports that he does not use drugs.   Family History:  The patient's family history includes Lymphoma in his mother; Peripheral vascular disease in his father.    ROS:  Please see the history of present illness.   Otherwise, review of systems are positive for none.   All other systems are reviewed and negative.    PHYSICAL EXAM: VS:  BP 140/60 (BP Location: Left Arm, Patient Position: Sitting, Cuff Size: Normal)   Pulse 85   Ht 5\' 5"  (1.651 m)   Wt 174 lb (78.9 kg)   SpO2 90%   BMI 28.96 kg/m  , BMI Body mass index is 28.96  kg/m. GEN: Well nourished, well developed, in no acute distress  HEENT: normal  Neck: no JVD, carotid bruits, or masses Cardiac: RRR; no  rubs, or gallops,no edema, no murmur. Respiratory: Mild bilateral rhonchi and diminished breath sounds. GI: soft, nontender, nondistended, + BS MS:  no deformity or atrophy  Skin: warm and dry, no rash Neuro:  Strength and sensation are intact Psych: euthymic mood, full affect    EKG:  EKG is ordered today. The ekg ordered today demonstrates normal sinus rhythm with right bundle branch block.   Recent Labs: 02/04/2021: TSH 0.627 10/31/2021: ALT 58 11/03/2021: Hemoglobin 13.9; Platelets 190 11/04/2021: B Natriuretic Peptide 1,587.5 11/06/2021: BUN 41; Creatinine, Ser 1.09; Magnesium 1.8; Potassium 4.4; Sodium 144    Lipid Panel    Component Value Date/Time   CHOL 90 01/12/2021 0445   CHOL 102 03/27/2018 0900   TRIG 87 01/12/2021 0445   HDL 28 (L) 01/12/2021 0445   HDL 35 (L) 03/27/2018 0900   CHOLHDL 3.2 01/12/2021 0445   VLDL 17 01/12/2021 0445   LDLCALC 45 01/12/2021 0445   LDLCALC 50 03/27/2018 0900      Wt Readings from Last 3 Encounters:  11/24/21 174 lb (78.9 kg)  11/21/21 175 lb 3.2 oz (79.5 kg)  11/06/21 184 lb 11.9 oz (83.8 kg)       No flowsheet data found.    ASSESSMENT AND PLAN:  1.  Peripheral arterial disease:  Status post atherectomy and drug-coated balloon angioplasty of the left SFA.  Currently with no leg claudication.  He is due for an ABI and duplex which were requested today.    2. Coronary artery disease involving native coronary arteries without angina: Status post CABG in March 2019.  No convincing evidence of angina.  Continue medical therapy.   3.  Status post bioprosthetic aortic valve replacement for aortic stenosis: Most recent echo showed normal functioning bioprosthetic aortic valve.   4.  Chronic systolic heart failure with moderately reduced LV systolic function.  Continue treatment with  Toprol and losartan.  He appears to be euvolemic on current dose of furosemide 40 mg daily.  He had recent issues of acute kidney injury and hyperkalemia.  He had labs done last week with his primary care physician which showed normal renal function.  I will consider switching him to [facility] in the near future.   5.  Hyperlipidemia: It appears that atorvastatin was discontinued likely due to some myalgia symptoms.  I elected to start him on rosuvastatin and continue Zetia.  6.  Essential hypertension: Blood pressure is mildly elevated.  Will consider switching losartan to Entresto in the near future as long as his renal function remains stable.     Disposition:   FU with me in 3 months   Signed, [name], MD 11/24/21 [facility] [place], [place] [phone]

## 2021-11-24 NOTE — Patient Instructions (Signed)
Medication Instructions:  Your physician has recommended you make the following change in your medication:   START Crestor (rosuvastatin) 10 mg daily. An Rx has been sent to your pharmacy.   *If you need a refill on your cardiac medications before your next appointment, please call your pharmacy*   Lab Work: None ordered If you have labs (blood work) drawn today and your tests are completely normal, you will receive your results only by: MyChart Message (if you have MyChart) OR A paper copy in the mail If you have any lab test that is abnormal or we need to change your treatment, we will call you to review the results.   Testing/Procedures: Your physician has requested that you have a lower extremity arterial  duplex. During this test,ultrasound are used to evaluate arterial blood flow in the legs. Allow one hour for this exam. There are no restrictions or special instructions.  Your physician has requested that you have an ankle brachial index (ABI). During this test an ultrasound and blood pressure cuff are used to evaluate the arteries that supply the arms and legs with blood. Allow thirty minutes for this exam. There are no restrictions or special instructions.    Follow-Up: At [facility], you and your health needs are our priority.  As part of our continuing mission to provide you with exceptional heart care, we have created designated Provider Care Teams.  These Care Teams include your primary Cardiologist (physician) and Advanced Practice Providers (APPs -  Physician Assistants and Nurse Practitioners) who all work together to provide you with the care you need, when you need it.  We recommend signing up for the patient portal called "MyChart".  Sign up information is provided on this After Visit Summary.  MyChart is used to connect with patients for Virtual Visits (Telemedicine).  Patients are able to view lab/test results, encounter notes, upcoming appointments, etc.   Non-urgent messages can be sent to your provider as well.   To learn more about what you can do with MyChart, go to [email or web address].    Your next appointment:   3 month(s)  The format for your next appointment:   In Person  Provider:   You may see [name], MD or one of the following Advanced Practice Providers on your designated Care Team:   Nicolasa Ducking, NP Eula Listen, PA-C Cadence Fransico Michael, PA-C1}    Other Instructions N/A

## 2021-12-23 ENCOUNTER — Emergency Department: Payer: Medicare Other

## 2021-12-23 ENCOUNTER — Encounter: Payer: Self-pay | Admitting: Pulmonary Disease

## 2021-12-23 ENCOUNTER — Other Ambulatory Visit: Payer: Self-pay

## 2021-12-23 DIAGNOSIS — Z87891 Personal history of nicotine dependence: Secondary | ICD-10-CM

## 2021-12-23 DIAGNOSIS — Z7982 Long term (current) use of aspirin: Secondary | ICD-10-CM

## 2021-12-23 DIAGNOSIS — Z951 Presence of aortocoronary bypass graft: Secondary | ICD-10-CM

## 2021-12-23 DIAGNOSIS — Z888 Allergy status to other drugs, medicaments and biological substances status: Secondary | ICD-10-CM

## 2021-12-23 DIAGNOSIS — I110 Hypertensive heart disease with heart failure: Principal | ICD-10-CM | POA: Diagnosis present

## 2021-12-23 DIAGNOSIS — R0602 Shortness of breath: Secondary | ICD-10-CM | POA: Diagnosis not present

## 2021-12-23 DIAGNOSIS — Z79899 Other long term (current) drug therapy: Secondary | ICD-10-CM

## 2021-12-23 DIAGNOSIS — Z9981 Dependence on supplemental oxygen: Secondary | ICD-10-CM

## 2021-12-23 DIAGNOSIS — J9621 Acute and chronic respiratory failure with hypoxia: Secondary | ICD-10-CM | POA: Diagnosis present

## 2021-12-23 DIAGNOSIS — E7800 Pure hypercholesterolemia, unspecified: Secondary | ICD-10-CM | POA: Diagnosis present

## 2021-12-23 DIAGNOSIS — I5023 Acute on chronic systolic (congestive) heart failure: Secondary | ICD-10-CM | POA: Diagnosis present

## 2021-12-23 DIAGNOSIS — Z7951 Long term (current) use of inhaled steroids: Secondary | ICD-10-CM

## 2021-12-23 DIAGNOSIS — Z66 Do not resuscitate: Secondary | ICD-10-CM | POA: Diagnosis present

## 2021-12-23 DIAGNOSIS — E119 Type 2 diabetes mellitus without complications: Secondary | ICD-10-CM | POA: Diagnosis present

## 2021-12-23 DIAGNOSIS — I255 Ischemic cardiomyopathy: Secondary | ICD-10-CM | POA: Diagnosis present

## 2021-12-23 DIAGNOSIS — I252 Old myocardial infarction: Secondary | ICD-10-CM

## 2021-12-23 DIAGNOSIS — Z807 Family history of other malignant neoplasms of lymphoid, hematopoietic and related tissues: Secondary | ICD-10-CM

## 2021-12-23 DIAGNOSIS — Z953 Presence of xenogenic heart valve: Secondary | ICD-10-CM

## 2021-12-23 DIAGNOSIS — Z8616 Personal history of COVID-19: Secondary | ICD-10-CM

## 2021-12-23 DIAGNOSIS — J441 Chronic obstructive pulmonary disease with (acute) exacerbation: Secondary | ICD-10-CM | POA: Diagnosis present

## 2021-12-23 DIAGNOSIS — Z7984 Long term (current) use of oral hypoglycemic drugs: Secondary | ICD-10-CM

## 2021-12-23 DIAGNOSIS — I2510 Atherosclerotic heart disease of native coronary artery without angina pectoris: Secondary | ICD-10-CM | POA: Diagnosis present

## 2021-12-23 DIAGNOSIS — Z8249 Family history of ischemic heart disease and other diseases of the circulatory system: Secondary | ICD-10-CM

## 2021-12-23 LAB — CBC
HCT: 47.1 % (ref 39.0–52.0)
Hemoglobin: 15.9 g/dL (ref 13.0–17.0)
MCH: 30.3 pg (ref 26.0–34.0)
MCHC: 33.8 g/dL (ref 30.0–36.0)
MCV: 89.7 fL (ref 80.0–100.0)
Platelets: 200 10*3/uL (ref 150–400)
RBC: 5.25 MIL/uL (ref 4.22–5.81)
RDW: 14.6 % (ref 11.5–15.5)
WBC: 10.7 10*3/uL — ABNORMAL HIGH (ref 4.0–10.5)
nRBC: 0 % (ref 0.0–0.2)

## 2021-12-23 LAB — TROPONIN I (HIGH SENSITIVITY)
Troponin I (High Sensitivity): 19 ng/L — ABNORMAL HIGH (ref <18)
Troponin I (High Sensitivity): 17 ng/L (ref <18)

## 2021-12-23 LAB — BASIC METABOLIC PANEL
Anion gap: 9 (ref 5–15)
BUN: 24 mg/dL — ABNORMAL HIGH (ref 8–23)
CO2: 24 mmol/L (ref 22–32)
Calcium: 9.5 mg/dL (ref 8.9–10.3)
Chloride: 106 mmol/L (ref 98–111)
Creatinine, Ser: 1.14 mg/dL (ref 0.61–1.24)
GFR, Estimated: >60 mL/min (ref >60)
Glucose, Bld: 122 mg/dL — ABNORMAL HIGH (ref 70–99)
Potassium: 4.7 mmol/L (ref 3.5–5.1)
Sodium: 139 mmol/L (ref 135–145)

## 2021-12-23 LAB — BRAIN NATRIURETIC PEPTIDE: B Natriuretic Peptide: 798.2 pg/mL — ABNORMAL HIGH (ref 0.0–100.0)

## 2021-12-23 NOTE — ED Notes (Signed)
Oxygen tank switched and new tank set to 4lpm as old tank

## 2021-12-23 NOTE — ED Triage Notes (Signed)
Patient coming EMS from home for exertional SOB X 2 days and productive cough with yellow sputum. Patient on 3L oxygen via [place] at baseline. Patient has been taking prescribed inhaler and breathing treatments without relief. Patient had lower expiratory wheezes at EMS arrival. Patient oxygen saturation a 88% on 3L at EMS arrival, received 1 duoneb, and oxygen increased to 96%.   Patient reports generalized chest pain; reports has it at baseline since his heart surgery in 2019.

## 2021-12-23 NOTE — ED Notes (Signed)
Pt's spouse updated on wait, spouse states pt was supposed to take his medicaitonat 2100 but they did not bring it with them. Pt's spouse states pt with blood sugar in 90s "which is low for him". Small amount of orange juice provided to help with sugar. Pt continues to appear in no acute distress.

## 2021-12-23 NOTE — ED Notes (Signed)
Pt updated on wait, pt states he feels dizzy and is not sure how much longer he can wait. Pt appears in no acute distress, vital signs obtained.

## 2021-12-23 NOTE — Progress Notes (Signed)
Patient called with complaints of sinuses hurting for 2-3 days and increased SOB today. Sounded breathless on phone. Recommended ED eval due to increased WOB. Pt understood instructions.

## 2021-12-24 ENCOUNTER — Encounter: Payer: Self-pay | Admitting: Internal Medicine

## 2021-12-24 ENCOUNTER — Inpatient Hospital Stay
Admission: EM | Admit: 2021-12-24 | Discharge: 2021-12-25 | DRG: 291 | Disposition: A | Discharge status: Home / Self Care | Payer: Medicare Other | Attending: Student in an Organized Health Care Education/Training Program | Admitting: Student in an Organized Health Care Education/Training Program

## 2021-12-24 DIAGNOSIS — I5042 Chronic combined systolic (congestive) and diastolic (congestive) heart failure: Secondary | ICD-10-CM | POA: Diagnosis present

## 2021-12-24 DIAGNOSIS — I509 Heart failure, unspecified: Secondary | ICD-10-CM | POA: Diagnosis not present

## 2021-12-24 DIAGNOSIS — I5023 Acute on chronic systolic (congestive) heart failure: Secondary | ICD-10-CM | POA: Diagnosis present

## 2021-12-24 DIAGNOSIS — E119 Type 2 diabetes mellitus without complications: Secondary | ICD-10-CM | POA: Diagnosis present

## 2021-12-24 DIAGNOSIS — Z8249 Family history of ischemic heart disease and other diseases of the circulatory system: Secondary | ICD-10-CM | POA: Diagnosis not present

## 2021-12-24 DIAGNOSIS — Z953 Presence of xenogenic heart valve: Secondary | ICD-10-CM | POA: Diagnosis not present

## 2021-12-24 DIAGNOSIS — J9621 Acute and chronic respiratory failure with hypoxia: Secondary | ICD-10-CM | POA: Diagnosis present

## 2021-12-24 DIAGNOSIS — J9601 Acute respiratory failure with hypoxia: Secondary | ICD-10-CM | POA: Diagnosis not present

## 2021-12-24 DIAGNOSIS — I110 Hypertensive heart disease with heart failure: Secondary | ICD-10-CM | POA: Diagnosis present

## 2021-12-24 DIAGNOSIS — I2510 Atherosclerotic heart disease of native coronary artery without angina pectoris: Secondary | ICD-10-CM | POA: Diagnosis present

## 2021-12-24 DIAGNOSIS — I255 Ischemic cardiomyopathy: Secondary | ICD-10-CM | POA: Diagnosis present

## 2021-12-24 DIAGNOSIS — Z807 Family history of other malignant neoplasms of lymphoid, hematopoietic and related tissues: Secondary | ICD-10-CM | POA: Diagnosis not present

## 2021-12-24 DIAGNOSIS — R0602 Shortness of breath: Secondary | ICD-10-CM | POA: Diagnosis present

## 2021-12-24 DIAGNOSIS — E7800 Pure hypercholesterolemia, unspecified: Secondary | ICD-10-CM | POA: Diagnosis present

## 2021-12-24 DIAGNOSIS — Z951 Presence of aortocoronary bypass graft: Secondary | ICD-10-CM | POA: Diagnosis not present

## 2021-12-24 DIAGNOSIS — Z8616 Personal history of COVID-19: Secondary | ICD-10-CM | POA: Diagnosis not present

## 2021-12-24 DIAGNOSIS — Z79899 Other long term (current) drug therapy: Secondary | ICD-10-CM | POA: Diagnosis not present

## 2021-12-24 DIAGNOSIS — Z7984 Long term (current) use of oral hypoglycemic drugs: Secondary | ICD-10-CM | POA: Diagnosis not present

## 2021-12-24 DIAGNOSIS — I5022 Chronic systolic (congestive) heart failure: Secondary | ICD-10-CM | POA: Diagnosis present

## 2021-12-24 DIAGNOSIS — I252 Old myocardial infarction: Secondary | ICD-10-CM | POA: Diagnosis not present

## 2021-12-24 DIAGNOSIS — J441 Chronic obstructive pulmonary disease with (acute) exacerbation: Secondary | ICD-10-CM | POA: Diagnosis present

## 2021-12-24 DIAGNOSIS — Z794 Long term (current) use of insulin: Secondary | ICD-10-CM | POA: Diagnosis not present

## 2021-12-24 DIAGNOSIS — Z87891 Personal history of nicotine dependence: Secondary | ICD-10-CM | POA: Diagnosis not present

## 2021-12-24 DIAGNOSIS — Z9981 Dependence on supplemental oxygen: Secondary | ICD-10-CM | POA: Diagnosis not present

## 2021-12-24 DIAGNOSIS — J9600 Acute respiratory failure, unspecified whether with hypoxia or hypercapnia: Secondary | ICD-10-CM | POA: Diagnosis present

## 2021-12-24 DIAGNOSIS — E1165 Type 2 diabetes mellitus with hyperglycemia: Secondary | ICD-10-CM

## 2021-12-24 DIAGNOSIS — Z7982 Long term (current) use of aspirin: Secondary | ICD-10-CM | POA: Diagnosis not present

## 2021-12-24 DIAGNOSIS — Z7951 Long term (current) use of inhaled steroids: Secondary | ICD-10-CM | POA: Diagnosis not present

## 2021-12-24 DIAGNOSIS — Z66 Do not resuscitate: Secondary | ICD-10-CM | POA: Diagnosis present

## 2021-12-24 DIAGNOSIS — Z888 Allergy status to other drugs, medicaments and biological substances status: Secondary | ICD-10-CM | POA: Diagnosis not present

## 2021-12-24 LAB — CBC
HCT: 46.3 % (ref 39.0–52.0)
Hemoglobin: 16 g/dL (ref 13.0–17.0)
MCH: 29.9 pg (ref 26.0–34.0)
MCHC: 34.6 g/dL (ref 30.0–36.0)
MCV: 86.5 fL (ref 80.0–100.0)
Platelets: 196 10*3/uL (ref 150–400)
RBC: 5.35 MIL/uL (ref 4.22–5.81)
RDW: 14.4 % (ref 11.5–15.5)
WBC: 9.7 10*3/uL (ref 4.0–10.5)
nRBC: 0 % (ref 0.0–0.2)

## 2021-12-24 LAB — CREATININE, SERUM
Creatinine, Ser: 1.06 mg/dL (ref 0.61–1.24)
GFR, Estimated: >60 mL/min (ref >60)

## 2021-12-24 LAB — CBG MONITORING, ED
Glucose-Capillary: 303 mg/dL — ABNORMAL HIGH (ref 70–99)
Glucose-Capillary: 300 mg/dL — ABNORMAL HIGH (ref 70–99)
Glucose-Capillary: 293 mg/dL — ABNORMAL HIGH (ref 70–99)

## 2021-12-24 LAB — GLUCOSE, CAPILLARY: Glucose-Capillary: 283 mg/dL — ABNORMAL HIGH (ref 70–99)

## 2021-12-24 LAB — HIV ANTIBODY (ROUTINE TESTING W REFLEX): HIV Screen 4th Generation wRfx: NONREACTIVE

## 2021-12-24 MED ORDER — ASPIRIN EC 81 MG PO TBEC
81.0000 mg | DELAYED_RELEASE_TABLET | Freq: Every day | ORAL | Status: DC
  Administered 2021-12-24 – 2021-12-25 (×2): 81 mg via ORAL
  Filled 2021-12-24 (×2): qty 1

## 2021-12-24 MED ORDER — ALPRAZOLAM 0.5 MG PO TABS
0.5000 mg | ORAL_TABLET | Freq: Two times a day (BID) | ORAL | Status: DC
  Administered 2021-12-24 – 2021-12-25 (×3): 0.5 mg via ORAL
  Filled 2021-12-24 (×3): qty 1

## 2021-12-24 MED ORDER — SODIUM CHLORIDE 0.9 % IV SOLN: 250.0000 mL | INTRAVENOUS | Status: DC | PRN

## 2021-12-24 MED ORDER — BUSPIRONE HCL 10 MG PO TABS
10.0000 mg | ORAL_TABLET | Freq: Two times a day (BID) | ORAL | Status: DC
Start: 2021-12-24 — End: 2021-12-25
  Administered 2021-12-24 – 2021-12-25 (×3): 10 mg via ORAL
  Filled 2021-12-24 (×2): qty 1
  Filled 2021-12-24: qty 2
  Filled 2021-12-24 (×2): qty 1

## 2021-12-24 MED ORDER — ROSUVASTATIN CALCIUM 10 MG PO TABS
10.0000 mg | ORAL_TABLET | Freq: Every day | ORAL | Status: DC
  Administered 2021-12-24 – 2021-12-25 (×2): 10 mg via ORAL
  Filled 2021-12-24: qty 0.5
  Filled 2021-12-24: qty 1
  Filled 2021-12-24: qty 0.5

## 2021-12-24 MED ORDER — AMLODIPINE BESYLATE 10 MG PO TABS
10.0000 mg | ORAL_TABLET | Freq: Every day | ORAL | Status: DC
  Administered 2021-12-24 – 2021-12-25 (×2): 10 mg via ORAL
  Filled 2021-12-24: qty 2
  Filled 2021-12-24: qty 1

## 2021-12-24 MED ORDER — IPRATROPIUM-ALBUTEROL 0.5-2.5 (3) MG/3ML IN SOLN
3.0000 mL | Freq: Four times a day (QID) | RESPIRATORY_TRACT | Status: DC
  Administered 2021-12-24 (×2): 3 mL via RESPIRATORY_TRACT
  Filled 2021-12-24: qty 3

## 2021-12-24 MED ORDER — IPRATROPIUM-ALBUTEROL 0.5-2.5 (3) MG/3ML IN SOLN: 3.0000 mL | Freq: Once | RESPIRATORY_TRACT | Status: AC

## 2021-12-24 MED ORDER — INSULIN ASPART 100 UNIT/ML IJ SOLN
0.0000 [IU] | Freq: Every day | INTRAMUSCULAR | Status: DC
  Administered 2021-12-24: 3 [IU] via SUBCUTANEOUS
  Filled 2021-12-24: qty 1

## 2021-12-24 MED ORDER — PANTOPRAZOLE SODIUM 40 MG PO TBEC
40.0000 mg | DELAYED_RELEASE_TABLET | Freq: Every day | ORAL | Status: DC
  Administered 2021-12-24 – 2021-12-25 (×2): 40 mg via ORAL
  Filled 2021-12-24 (×2): qty 1

## 2021-12-24 MED ORDER — ARFORMOTEROL TARTRATE 15 MCG/2ML IN NEBU
15.0000 ug | INHALATION_SOLUTION | Freq: Two times a day (BID) | RESPIRATORY_TRACT | Status: DC
  Administered 2021-12-24 – 2021-12-25 (×2): 15 ug via RESPIRATORY_TRACT
  Filled 2021-12-24 (×5): qty 2

## 2021-12-24 MED ORDER — IPRATROPIUM-ALBUTEROL 0.5-2.5 (3) MG/3ML IN SOLN
RESPIRATORY_TRACT | Status: AC
  Administered 2021-12-24: 3 mL via RESPIRATORY_TRACT
  Filled 2021-12-24: qty 3

## 2021-12-24 MED ORDER — SODIUM CHLORIDE 0.9% FLUSH: 3.0000 mL | INTRAVENOUS | Status: DC | PRN

## 2021-12-24 MED ORDER — AZITHROMYCIN 250 MG PO TABS
250.0000 mg | ORAL_TABLET | Freq: Every day | ORAL | Status: DC
  Administered 2021-12-25: 250 mg via ORAL
  Filled 2021-12-24: qty 1

## 2021-12-24 MED ORDER — LOSARTAN POTASSIUM 50 MG PO TABS
100.0000 mg | ORAL_TABLET | Freq: Every day | ORAL | Status: DC
  Administered 2021-12-24 – 2021-12-25 (×2): 100 mg via ORAL
  Filled 2021-12-24 (×2): qty 2

## 2021-12-24 MED ORDER — INSULIN ASPART 100 UNIT/ML IJ SOLN
0.0000 [IU] | Freq: Three times a day (TID) | INTRAMUSCULAR | Status: DC
  Administered 2021-12-24 (×2): 8 [IU] via SUBCUTANEOUS
  Administered 2021-12-24: 11 [IU] via SUBCUTANEOUS
  Administered 2021-12-25: 8 [IU] via SUBCUTANEOUS
  Administered 2021-12-25: 2 [IU] via SUBCUTANEOUS
  Filled 2021-12-24 (×5): qty 1

## 2021-12-24 MED ORDER — AZITHROMYCIN 500 MG PO TABS
500.0000 mg | ORAL_TABLET | Freq: Every day | ORAL | Status: AC
  Administered 2021-12-24: 500 mg via ORAL
  Filled 2021-12-24: qty 1

## 2021-12-24 MED ORDER — GUAIFENESIN ER 600 MG PO TB12: 1200.0000 mg | ORAL_TABLET | Freq: Two times a day (BID) | ORAL | Status: DC | PRN

## 2021-12-24 MED ORDER — FUROSEMIDE 10 MG/ML IJ SOLN
40.0000 mg | Freq: Once | INTRAMUSCULAR | Status: AC
  Administered 2021-12-24: 40 mg via INTRAVENOUS
  Filled 2021-12-24: qty 4

## 2021-12-24 MED ORDER — ACETAMINOPHEN 325 MG PO TABS: 650.0000 mg | ORAL_TABLET | Freq: Four times a day (QID) | ORAL | Status: DC | PRN

## 2021-12-24 MED ORDER — REVEFENACIN 175 MCG/3ML IN SOLN
175.0000 ug | Freq: Every day | RESPIRATORY_TRACT | Status: DC
  Administered 2021-12-24 – 2021-12-25 (×2): 175 ug via RESPIRATORY_TRACT
  Filled 2021-12-24 (×3): qty 3

## 2021-12-24 MED ORDER — ACETAMINOPHEN 325 MG PO TABS: 650.0000 mg | ORAL_TABLET | ORAL | Status: DC | PRN

## 2021-12-24 MED ORDER — IPRATROPIUM-ALBUTEROL 0.5-2.5 (3) MG/3ML IN SOLN
3.0000 mL | Freq: Once | RESPIRATORY_TRACT | Status: AC
  Administered 2021-12-24: 3 mL via RESPIRATORY_TRACT
  Filled 2021-12-24: qty 3

## 2021-12-24 MED ORDER — TRAZODONE HCL 50 MG PO TABS
50.0000 mg | ORAL_TABLET | Freq: Every evening | ORAL | Status: DC | PRN
  Administered 2021-12-24: 50 mg via ORAL
  Filled 2021-12-24: qty 1

## 2021-12-24 MED ORDER — SODIUM CHLORIDE 0.9% FLUSH
3.0000 mL | Freq: Two times a day (BID) | INTRAVENOUS | Status: DC
  Administered 2021-12-24 – 2021-12-25 (×4): 3 mL via INTRAVENOUS

## 2021-12-24 MED ORDER — BUDESONIDE 0.5 MG/2ML IN SUSP
0.5000 mg | Freq: Two times a day (BID) | RESPIRATORY_TRACT | Status: DC
  Administered 2021-12-24 – 2021-12-25 (×2): 0.5 mg via RESPIRATORY_TRACT
  Filled 2021-12-24 (×2): qty 2

## 2021-12-24 MED ORDER — METHYLPREDNISOLONE SODIUM SUCC 125 MG IJ SOLR
125.0000 mg | Freq: Once | INTRAMUSCULAR | Status: AC
  Administered 2021-12-24: 125 mg via INTRAVENOUS
  Filled 2021-12-24: qty 2

## 2021-12-24 MED ORDER — METHYLPREDNISOLONE SODIUM SUCC 40 MG IJ SOLR
40.0000 mg | Freq: Every day | INTRAMUSCULAR | Status: DC
  Administered 2021-12-25: 40 mg via INTRAVENOUS
  Filled 2021-12-24: qty 1

## 2021-12-24 MED ORDER — EZETIMIBE 10 MG PO TABS
10.0000 mg | ORAL_TABLET | Freq: Every day | ORAL | Status: DC
  Administered 2021-12-24: 10 mg via ORAL
  Filled 2021-12-24 (×3): qty 1

## 2021-12-24 MED ORDER — FUROSEMIDE 10 MG/ML IJ SOLN
40.0000 mg | Freq: Two times a day (BID) | INTRAMUSCULAR | Status: DC
  Administered 2021-12-24 – 2021-12-25 (×3): 40 mg via INTRAVENOUS
  Filled 2021-12-24 (×3): qty 4

## 2021-12-24 MED ORDER — ONDANSETRON HCL 4 MG/2ML IJ SOLN: 4.0000 mg | Freq: Four times a day (QID) | INTRAMUSCULAR | Status: DC | PRN

## 2021-12-24 MED ORDER — IPRATROPIUM-ALBUTEROL 0.5-2.5 (3) MG/3ML IN SOLN: 3.0000 mL | Freq: Four times a day (QID) | RESPIRATORY_TRACT | Status: DC | PRN

## 2021-12-24 MED ORDER — METOPROLOL SUCCINATE ER 50 MG PO TB24
50.0000 mg | ORAL_TABLET | Freq: Every day | ORAL | Status: DC
  Administered 2021-12-24 – 2021-12-25 (×2): 50 mg via ORAL
  Filled 2021-12-24 (×2): qty 1

## 2021-12-24 MED ORDER — ENOXAPARIN SODIUM 40 MG/0.4ML IJ SOSY
40.0000 mg | PREFILLED_SYRINGE | INTRAMUSCULAR | Status: DC
  Administered 2021-12-24: 40 mg via SUBCUTANEOUS
  Filled 2021-12-24 (×2): qty 0.4

## 2021-12-24 NOTE — H&P (Signed)
History and Physical    Harold Parker [id number] DOB: 1946-04-16 DOA: 12/24/2021  PCP: Harold Manes, MD   Patient coming from: home  I have personally briefly reviewed patient's relevant medical records in [place]  Chief Complaint: shortness of breath  HPI: Harold Parker is a 76 y.o. 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  The symptoms did not improve with home bronchodilator treatments.  He denied chest pain, lower extremity pain or swelling.  O2 sats 88% on 3 L on arrival of EMS. Patient was hospitalized with COVID in November 2022.  ED course: Tachypneic to 22 with BP 172/81 and otherwise normal vitals but with increased work of breathing Blood work: Troponin 19-17 and BNP 798 Hemoglobin 10.7 Other blood work unremarkable  EKG, personally viewed and interpreted: NSR at 79 with nonspecific ST-T wave changes  Chest x-ray showing diffuse bilateral interstitial pulmonary opacities likely edema.  No focal airspace opacity  Patient treated with Lasix and duo nebs, IV Solu-Medrol.  Hospitalist consulted for admission   Review of Systems: As per HPI otherwise all other systems on review of systems negative.   Assessment/Plan  Acute on chronic respiratory failure with hypoxia -Secondary to COPD and CHF exacerbation - O2 sat 88% on home flow rate of 3 to 4 L O2 - Supplemental O2 to keep sats over 90% - Treat COPD and CHF as outlined below    Acute on chronic systolic CHF (congestive heart failure) (HCC) - IV Lasix - Continue losartan, metoprolol, - Daily weights with intake and output monitoring - Had echocardiogram November 2022 with EF 35 to  40%    COPD, stage III, with acute exacerbation ([place]) - Schedule and as needed nebulized bronchodilator therapy - IV steroids - IV Rocephin - Consider pulmonology consult    CAD (coronary artery disease)   S/P CABG x 3 - No complaints of chest pain, troponin with non-ACS trend and EKG nonacute - Continue aspirin, metoprolol, rosuvastatin  Hypertension - Continue amlodipine, metoprolol, losartan    Type II diabetes mellitus (HCC) - Sliding scale insulin    S/P aortic valve replacement with bioprosthetic valve - No acute issues suspected   DVT prophylaxis: Lovenox  Code Status: full code  Family Communication:  wife at bedside  Disposition Plan: Back to previous home environment Consults called: none  Status:At the time of admission, it appears that the appropriate admission status for this patient is INPATIENT. This is judged to be reasonable and necessary in order to provide the required intensity of service to ensure the patient's safety given the presenting symptoms, physical exam findings, and initial radiographic and laboratory data in the context of their  Comorbid conditions.   Patient requires inpatient status due to high intensity of service, high risk for further deterioration and high frequency of surveillance required.   I certify that at the point of admission it is my clinical judgment that the patient will require inpatient hospital care spanning beyond 2 midnights     Physical Exam: Vitals:   12/23/21 1424 12/23/21 1921 12/23/21 2257  BP: 138/81 (!) 172/81 (!) 157/87  Pulse: 68 84 82  Resp: 18 20 (!) 22  Temp:  98.1 F (36.7 C)    TempSrc: Oral    SpO2: 94% 94% 94%   Constitutional: Alert, oriented x 3 .  Mild conversational dyspnea HEENT:      Head: Normocephalic and atraumatic.         Eyes: PERLA, EOMI, Conjunctivae are normal. Sclera is non-icteric.       Mouth/Throat: Mucous membranes are moist.       Neck: Supple with no signs of  meningismus. Cardiovascular: Regular rate and rhythm. No murmurs, gallops, or rubs. 2+ symmetrical distal pulses are present . No JVD. No  LE edema Respiratory: Respiratory effort increased with diminished breath sounds bilaterally Gastrointestinal: Soft, non tender, non distended. Positive bowel sounds.  Genitourinary: No CVA tenderness. Musculoskeletal: Nontender with normal range of motion in all extremities. No cyanosis, or erythema of extremities. Neurologic:  Face is symmetric. Moving all extremities. No gross focal neurologic deficits . Skin: Skin is warm, dry.  No rash or ulcers Psychiatric: Mood and affect are appropriate     Past Medical History:  Diagnosis Date   Bilateral carotid bruits    a. 01/2018 U/S: < 50% bilat ICA stenosis.   CAD (coronary artery disease)    a. 1998 s/p MI and BMS [place], [place]); b. 1999 redo PCI/rotablator in setting of what sounds like ISR;  c. Multiple stress tests over the years - last ~ 2017, reportedly nl; d. 01/2018 NSTEMI/Cath: LM 91m/d, LAD 50p, 40p/m, D1 60ost, OM1 95, RCA 100ost/p w/ L->R collats, EF 45%; e. s/p 3V CABG 02/19/18 (LIMA-LAD, VG-D1, VG-OM)   Chronic lower back pain    COPD (chronic obstructive pulmonary disease) (HCC)    GIB (gastrointestinal bleeding)    a. 02/2018 GIB and anemia w/ Hgb of 4.7 on presentation; b. 03/2018 EGD: 2 nonbleeding duodenal ulcers.   HTN (hypertension)    Hypercholesteremia    Ischemic cardiomyopathy    a. 01/2018 Echo: EF 40-45%, mid-apicalanteroseptal, ant, apical sev HK, mod apicalinferior HK. Gr2 DD. Mod AS, mild MR, mod dil LA, PASP 71mmHg.   Moderate aortic stenosis    a. 01/2018 Echo: Mod AS, mean grad (S) 45mmHg, Valve area (VTI) 1.06 cm^2, (Vmax) 1.27 cm^2; b. 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   Myocardial infarction [facility]) ~ 1998/1999   S/P aortic valve replacement with bioprosthetic valve 02/19/2018   a. 02/19/2018 AVR: 25 mm Edwards Inspiris Resilia stented bovine pericardial tissue valve   S/P CABG x  3 02/19/2018   LIMA to LAD, SVG to D1, SVG to OM, EVH via right thigh and leg   Tobacco abuse    Type II diabetes mellitus ([place])     Past Surgical History:  Procedure Laterality Date   ABDOMINAL AORTOGRAM N/A 03/04/2019   Procedure: ABDOMINAL AORTOGRAM;  Surgeon: Wellington Hampshire, MD;  Location: [place] [facility];  Service: Cardiovascular;  Laterality: N/A;   AORTIC VALVE REPLACEMENT N/A 02/19/2018   Procedure: AORTIC VALVE REPLACEMENT (AVR);  Surgeon: Rexene Alberts, MD;  Location: [place];  Service: Open Heart Surgery;  Laterality: N/A;   COLONOSCOPY     CORONARY ANGIOPLASTY WITH STENT PLACEMENT  ~ 1998/1999   CORONARY ARTERY BYPASS GRAFT N/A 02/19/2018   Procedure: CORONARY ARTERY BYPASS GRAFTING (CABG) x three , using left internal mammary artery and right leg greater saphenous vein harvested endoscopically;  Surgeon: Rexene Alberts, MD;  Location: [place];  Service: Open Heart Surgery;  Laterality: N/A;   ESOPHAGOGASTRODUODENOSCOPY (EGD) WITH PROPOFOL N/A 03/18/2018   Procedure: ESOPHAGOGASTRODUODENOSCOPY (EGD) WITH PROPOFOL;  Surgeon: Lucilla Lame, MD;  Location: [facility];  Service: Endoscopy;  Laterality: N/A;   LEFT HEART CATH AND CORONARY ANGIOGRAPHY N/A 02/06/2018   Procedure: LEFT HEART CATH AND CORONARY ANGIOGRAPHY;  Surgeon: Wellington Hampshire, MD;  Location: [place] [facility];  Service: Cardiovascular;  Laterality: N/A;   LOWER EXTREMITY ANGIOGRAPHY Bilateral 03/04/2019   Procedure: Lower Extremity Angiography;  Surgeon: Wellington Hampshire, MD;  Location: [place] [facility];  Service: Cardiovascular;  Laterality: Bilateral;   PERIPHERAL VASCULAR ATHERECTOMY Left 03/04/2019   Procedure: PERIPHERAL VASCULAR ATHERECTOMY;  Surgeon: Wellington Hampshire, MD;  Location: [place] [facility];  Service: Cardiovascular;  Laterality: Left;  SFA   TEE WITHOUT CARDIOVERSION N/A 02/19/2018   Procedure: TRANSESOPHAGEAL ECHOCARDIOGRAM (TEE);  Surgeon: Rexene Alberts, MD;  Location: [place];  Service:  Open Heart Surgery;  Laterality: N/A;   TONSILLECTOMY       reports that he quit smoking about 3 years ago. His smoking use included cigarettes. He has a 40.00 pack-year smoking history. He has never used smokeless tobacco. He reports current alcohol use. He reports that he does not use drugs.  Allergies  Allergen Reactions   Atorvastatin Other (See Comments)    Leg aches and weakness     Family History  Problem Relation Age of Onset   Lymphoma Mother    Peripheral vascular disease Father      Prior to Admission medications   Medication Sig Start Date End Date Taking? Authorizing Provider  acetaminophen (TYLENOL) 500 MG tablet Take 1,000 mg by mouth daily as needed for moderate pain or headache.    [provider]  albuterol (PROVENTIL) (2.5 MG/3ML) 0.083% nebulizer solution Take 3 mLs (2.5 mg total) by nebulization every 6 (six) hours as needed for wheezing or shortness of breath. 03/01/21   Tyler Pita, MD  ALPRAZolam Duanne Moron) 0.5 MG tablet Take 0.5 mg by mouth 2 (two) times daily.    [provider]  amLODipine (NORVASC) 10 MG tablet Take 1 tablet (10 mg total) by mouth daily. 11/06/21   Ezekiel Slocumb, DO  aspirin EC 81 MG tablet Take 81 mg by mouth daily.    [provider]  budesonide (PULMICORT) 0.5 MG/2ML nebulizer solution Take 2 mLs (0.5 mg total) by nebulization 2 (two) times daily. Use AFTER Perforomist 03/01/21   Tyler Pita, MD  busPIRone (BUSPAR) 10 MG tablet Take 10 mg by mouth 2 (two) times daily. 04/15/21   [provider]  esomeprazole (NEXIUM) 40 MG capsule Take 40 mg by mouth at bedtime.     [provider]  ezetimibe (ZETIA) 10 MG tablet Take 10 mg by mouth at bedtime.     [provider]  formoterol (PERFOROMIST) 20 MCG/2ML nebulizer solution Take 2 mLs (20 mcg total) by nebulization 2 (two) times daily. 03/01/21   Tyler Pita, MD  furosemide (LASIX) 40 MG tablet Take 1 tablet (40 mg total)  by mouth 2 (two) times daily. Patient taking differently: Take 40 mg by mouth daily. 11/22/21   Wellington Hampshire, MD  glipiZIDE (GLUCOTROL) 5 MG tablet Take 0.5 tablets (2.5 mg total) by mouth daily before breakfast. Patient taking differently: Take 5 mg by mouth 2 (two) times daily before a meal. Take 5 mg in the morning and take a second 5 mg dose at night on Mon, Wed, and Fri 02/27/18   Conte, Tessa N, PA-C  guaiFENesin (MUCINEX) 600 MG 12 hr tablet Take 1,200 mg by  mouth every 12 (twelve) hours as needed. 01/23/21   [provider]  losartan (COZAAR) 100 MG tablet Take 100 mg by mouth daily. 01/29/21   [provider]  metFORMIN (GLUCOPHAGE) 1000 MG tablet TAKE 1 TABLET BY MOUTH TWICE DAILY WITH A MEAL 04/27/18   Dahlia Byes, MD  metoprolol succinate (TOPROL-XL) 50 MG 24 hr tablet Take 1 tablet (50 mg total) by mouth daily. 05/26/21   Wellington Hampshire, MD  Nebulizers (COMPRESSOR/NEBULIZER) MISC Use as directed 10/26/21   Tyler Pita, MD  [facility] HFA 108 [id number] Base) MCG/ACT inhaler Inhale 1 puff into the lungs every 4 (four) hours as needed for wheezing or shortness of breath. 02/04/19   [provider]  revefenacin (YUPELRI) 175 MCG/3ML nebulizer solution Take 3 mLs (175 mcg total) by nebulization daily. Can be mixed with Perforomist 03/01/21   Tyler Pita, MD  rosuvastatin (CRESTOR) 10 MG tablet Take 1 tablet (10 mg total) by mouth daily. 11/24/21 02/22/22  Wellington Hampshire, MD  traZODone (DESYREL) 50 MG tablet Take 50 mg by mouth at bedtime as needed. 02/09/21   [provider]  Vitamin D, Cholecalciferol, 1000 units TABS Take 1,000 Units by mouth every morning.     [provider]      Labs on Admission: I have personally reviewed following labs and imaging studies  CBC: Recent Labs  Lab 12/23/21 1437  WBC 10.7*  HGB 15.9  HCT 47.1  MCV 89.7  PLT [id number]   Basic Metabolic Panel: Recent Labs  Lab 12/23/21 1437  NA 139  K 4.7  CL  106  CO2 24  GLUCOSE 122*  BUN 24*  CREATININE 1.14  CALCIUM 9.5   GFR: CrCl cannot be calculated (Unknown ideal weight.). Liver Function Tests: No results for input(s): AST, ALT, ALKPHOS, BILITOT, PROT, ALBUMIN in the last 168 hours. No results for input(s): LIPASE, AMYLASE in the last 168 hours. No results for input(s): AMMONIA in the last 168 hours. Coagulation Profile: No results for input(s): INR, PROTIME in the last 168 hours. Cardiac Enzymes: No results for input(s): CKTOTAL, CKMB, CKMBINDEX, TROPONINI in the last 168 hours. BNP (last 3 results) No results for input(s): PROBNP in the last 8760 hours. HbA1C: No results for input(s): HGBA1C in the last 72 hours. CBG: No results for input(s): GLUCAP in the last 168 hours. Lipid Profile: No results for input(s): CHOL, HDL, LDLCALC, TRIG, CHOLHDL, LDLDIRECT in the last 72 hours. Thyroid Function Tests: No results for input(s): TSH, T4TOTAL, FREET4, T3FREE, THYROIDAB in the last 72 hours. Anemia Panel: No results for input(s): VITAMINB12, FOLATE, FERRITIN, TIBC, IRON, RETICCTPCT in the last 72 hours. Urine analysis:    Component Value Date/Time   COLORURINE YELLOW (A) 11/01/2021 0931   APPEARANCEUR HAZY (A) 11/01/2021 0931   LABSPEC 1.017 11/01/2021 0931   PHURINE 5.0 11/01/2021 0931   GLUCOSEU 150 (A) 11/01/2021 0931   HGBUR NEGATIVE 11/01/2021 0931   BILIRUBINUR NEGATIVE 11/01/2021 0931   KETONESUR 5 (A) 11/01/2021 0931   PROTEINUR 100 (A) 11/01/2021 0931   NITRITE NEGATIVE 11/01/2021 0931   LEUKOCYTESUR NEGATIVE 11/01/2021 0931    Radiological Exams on Admission: DG Chest 2 View  Result Date: 12/23/2021 CLINICAL DATA:  Shortness of breath EXAM: CHEST - 2 VIEW COMPARISON:  11/02/2021 FINDINGS: 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 Aortic valve prosthesis. Mild, diffuse bilateral interstitial pulmonary opacity. The visualized skeletal structures are unremarkable. IMPRESSION: Cardiomegaly with mild,  diffuse bilateral interstitial pulmonary opacity, likely edema. No  focal airspace opacity. Electronically Signed   By: Delanna Ahmadi M.D.   On: 12/23/2021 15:00       Athena Masse MD [facility]   12/24/2021, 3:16 AM

## 2021-12-24 NOTE — Evaluation (Signed)
Physical Therapy Evaluation Patient Details Name: Harold Parker MRN: [id number] DOB: September 17, 1946 Today's Date: 12/24/2021  History of Present Illness  Pt is a 76 y/o M admitted on 12/24/21 after presenting to the ED with 2 day hx of SOB, wheezing & cough producing yellow sputum. Pt is being treated for acute on chronic respiratory failure with hypoxia 2/2 COPD & CHF exacerbation. PMH: CAD s/p CABG, aortic stenosis s/p bioprosthetic valve replacement, systolic heart failure, stage 3 severe COPD on chronic O2, HTN, PAD, DM, hospitalization related to covid 19 in Nov. 2022  Clinical Impression  Pt seen for PT evaluation with pt agreeable to tx. Pt reports he's independent without AD but pushes his O2 tank at home PRN, and is on 3-4L/min via nasal cannula. Pt received on 4L/min but had to increase to 6L/min to maintain SpO2 during gait. Pt is mod I with gait in the hallway, pushing the O2 tank without assistance. Pt endorses he's at his baseline in regards to functional mobility. Encouraged pt to ambulate with nursing staff while in acute setting. Otherwise pt does not demonstrate any acute PT needs at this time, PT to sign off.  SpO2 89% or > on 4L/min at rest, dropped to 83% on 4L/min with gait, increased to 6L/min & pt able to maintain >/= 89%, titrated back down to 4L/min at end of session & SPO2 89%       Recommendations for follow up therapy are one component of a multi-disciplinary discharge planning process, led by the attending physician.  Recommendations may be updated based on patient status, additional functional criteria and insurance authorization.  Follow Up Recommendations No PT follow up    Assistance Recommended at Discharge PRN  Patient can return home with the following       Equipment Recommendations None recommended by PT  Recommendations for Other Services       Functional Status Assessment Patient has not had a recent decline in their functional status     Precautions /  Restrictions Precautions Precautions: None Restrictions Weight Bearing Restrictions: No      Mobility  Bed Mobility               General bed mobility comments: pt received sitting EOB, left in chair    Transfers Overall transfer level: Modified independent Equipment used: Straight cane                    Ambulation/Gait Ambulation/Gait assistance: Modified independent (Device/Increase time) Gait Distance (Feet): 200 Feet Assistive device:  (pt elects to push O2 tank) Gait Pattern/deviations: Decreased step length - right;Decreased step length - left;Decreased stride length Gait velocity: decreased     General Gait Details: ~2 standing rest breaks to increase SpO2, slightl lateral sway but pt report he's at his baseline for mobility  Stairs            Wheelchair Mobility    Modified Rankin (Stroke Patients Only)       Balance Overall balance assessment: Mild deficits observed, not formally tested                                           Pertinent Vitals/Pain Pain Assessment: No/denies pain    Home Living Family/patient expects to be discharged to:: Private residence Living Arrangements: Spouse/significant other (fiance) Available Help at Discharge: Family;Available 24 hours/day Type of Home: House Home Access:  Stairs to enter Entrance Stairs-Rails: Right;Left;Can reach both Entrance Stairs-Number of Steps: 4   Home Layout: One level        Prior Function Prior Level of Function : Independent/Modified Independent             Mobility Comments: Pt reports independence with gait (pushes his O2 tank at home PRN), no falls in past 6 months. On 3-4L home O2       Hand Dominance        Extremity/Trunk Assessment   Upper Extremity Assessment Upper Extremity Assessment: Overall WFL for tasks assessed    Lower Extremity Assessment Lower Extremity Assessment: Overall WFL for tasks assessed        Communication   Communication: No difficulties  Cognition Arousal/Alertness: Awake/alert Behavior During Therapy: WFL for tasks assessed/performed Overall Cognitive Status: Within Functional Limits for tasks assessed                                          General Comments General comments (skin integrity, edema, etc.): Pt encouraged to ambulate with nursing staff while in acute setting.    Exercises     Assessment/Plan    PT Assessment Patient does not need any further PT services  PT Problem List         PT Treatment Interventions      PT Goals (Current goals can be found in the Care Plan section)  Acute Rehab PT Goals Patient Stated Goal: go home PT Goal Formulation: With patient Time For Goal Achievement: 01/07/22 Potential to Achieve Goals: Good    Frequency       Co-evaluation               AM-PAC PT "6 Clicks" Mobility  Outcome Measure Help needed turning from your back to your side while in a flat bed without using bedrails?: None Help needed moving from lying on your back to sitting on the side of a flat bed without using bedrails?: None Help needed moving to and from a bed to a chair (including a wheelchair)?: None Help needed standing up from a chair using your arms (e.g., wheelchair or bedside chair)?: None Help needed to walk in hospital room?: None Help needed climbing 3-5 steps with a railing? : None 6 Click Score: 24    End of Session Equipment Utilized During Treatment: Oxygen;Gait belt Activity Tolerance: Patient tolerated treatment well Patient left: in chair;with call bell/phone within reach Nurse Communication: Mobility status (O2)      Time: [id number] PT Time Calculation (min) (ACUTE ONLY): 11 min   Charges:   PT Evaluation $PT Eval Low Complexity: 1 Low          Lavone Nian, PT, DPT 12/24/21, 2:07 PM   Waunita Schooner 12/24/2021, 2:05 PM

## 2021-12-24 NOTE — ED Notes (Signed)
CBG 300. 

## 2021-12-24 NOTE — Hospital Course (Signed)
Harold Parker is a 76 y.o. 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  The symptoms did not improve with home bronchodilator treatments.  He denied chest pain, lower extremity pain or swelling.  O2 sats 88% on 3 L on arrival of EMS. Patient was hospitalized with COVID in November 2022.   ED course: Tachypneic to 22 with BP 172/81 and otherwise normal vitals but with increased work of breathing Blood work: Troponin 19-17 and BNP 798 Hemoglobin 10.7 Other blood work unremarkable   EKG, personally viewed and interpreted: NSR at 79 with nonspecific ST-T wave changes   Chest x-ray showing diffuse bilateral interstitial pulmonary opacities likely edema.  No focal airspace opacity   Patient treated with Lasix and duo nebs, IV Solu-Medrol.  Hospitalist consulted for admission

## 2021-12-24 NOTE — Assessment & Plan Note (Signed)
GLucoses normal here. -Continue SS corrections -Hold glipizide, metformin

## 2021-12-24 NOTE — Significant Event (Addendum)
° °      CROSS COVER NOTE  NAME: Harold Parker MRN: [id number] DOB : 1946-06-21   Called to bedside by RN reporting patient would like to be DNR.  Patient has MOST form in chart and has was previously  DNR during his most recent admission 10/31/2021, unclear why code status changed.  Spoke with patient at bedside who did indicate that he would like to be DNR. He does not want any compressions, intubation, shock and/or ACLS protocol in the event of cardiac or respiratory arrest.  Patient is alert and oriented x4 and has full decision-making capacity.  Chart updated with patient's request and desire to be DNR.   [name] MHA, MSN, FNP-BC Nurse Practitioner [facility] Pager [phone]

## 2021-12-24 NOTE — Assessment & Plan Note (Signed)
-  Furosemide 40 mg IV twice a day  -K supplement -Strict I/Os, daily weights, telemetry  -Daily monitoring renal function -Follow echo

## 2021-12-24 NOTE — ED Provider Notes (Signed)
[facility] Provider Note    Event Date/Time   First MD Initiated Contact with Patient 12/24/21 0210     (approximate)   History   Shortness of Breath   HPI  Harold Parker is a 76 y.o. male whose medical history includes chronic respiratory failure on 3 L of oxygen at baseline, congestive heart failure, and COPD.  He presents for evaluation of several days of worsening shortness of breath, symptoms are worse with lying flat and with exertion.  He was satting 88% on his 3 L of oxygen during triage which is lower than usual.  He said he feels like he cannot catch his breath and is making him very anxious.  He is not having chest pain.  He has had no significant weight gain or swelling.  He says that he is compliant with his medications including his furosemide.  Nothing particular seems to make the symptoms better and they are severe.  He has had no recent fever.     Physical Exam   Triage Vital Signs: ED Triage Vitals [12/23/21 1424]  Enc Vitals Group     BP 138/81     Pulse Rate 68     Resp 18     Temp 98.1 F (36.7 C)     Temp Source Oral     SpO2 94 %     Weight      Height      Head Circumference      Peak Flow      Pain Score 4     Pain Loc      Pain Edu?      Excl. in GC?     Most recent vital signs: Vitals:   12/24/21 0541 12/24/21 0600  BP: (!) 138/115 128/67  Pulse: 92 92  Resp: (!) 27 (!) 26  Temp:    SpO2:  92%     General: Awake, mild to moderate respiratory distress. CV:  Good peripheral perfusion.  Resp:  Increased respiratory effort with accessory muscle usage and tachypnea.  Coarse breath sounds throughout with some mild expiratory wheezing upon auscultation. Abd:  No distention.  Other:  Anxious but appropriately so under the circumstances.  No focal neurological deficits are appreciated.   ED Results / Procedures / Treatments   Labs (all labs ordered are listed, but only abnormal results are displayed) Labs  Reviewed  BASIC METABOLIC PANEL - Abnormal; Notable for the following components:      Result Value   Glucose, Bld 122 (*)    BUN 24 (*)    All other components within normal limits  CBC - Abnormal; Notable for the following components:   WBC 10.7 (*)    All other components within normal limits  BRAIN NATRIURETIC PEPTIDE - Abnormal; Notable for the following components:   B Natriuretic Peptide 798.2 (*)    All other components within normal limits  TROPONIN I (HIGH SENSITIVITY) - Abnormal; Notable for the following components:   Troponin I (High Sensitivity) 19 (*)    All other components within normal limits  HIV ANTIBODY (ROUTINE TESTING W REFLEX)  CBC  CREATININE, SERUM  TROPONIN I (HIGH SENSITIVITY)     EKG  ED ECG REPORT I, Loleta Rose, the attending physician, personally viewed and interpreted this ECG.  Date: 12/23/2021 EKG Time: 14: 32 Rate: 79 Rhythm: Sinus rhythm with bifascicular block QRS Axis: normal Intervals: normal ST/T Wave abnormalities: Non-specific ST segment / T-wave changes, but no clear  evidence of acute ischemia. Narrative Interpretation: no definitive evidence of acute ischemia; does not meet STEMI criteria.    RADIOLOGY I personally reviewed the patient's chest x-ray and feel it is concerning for some early pulmonary edema.  Radiology report confirms this assessment.    PROCEDURES:  Critical Care performed: No  .1-3 Lead EKG Interpretation Performed by: Loleta Rose, MD Authorized by: Loleta Rose, MD     Interpretation: normal     ECG rate:  90   ECG rate assessment: normal     Rhythm: sinus rhythm     Ectopy: none     Conduction: normal     MEDICATIONS ORDERED IN ED: Medications  aspirin EC tablet 81 mg (has no administration in time range)  amLODipine (NORVASC) tablet 10 mg (has no administration in time range)  metoprolol succinate (TOPROL-XL) 24 hr tablet 50 mg (has no administration in time range)  rosuvastatin  (CRESTOR) tablet 10 mg (has no administration in time range)  pantoprazole (PROTONIX) EC tablet 40 mg (has no administration in time range)  guaiFENesin (MUCINEX) 12 hr tablet 1,200 mg (has no administration in time range)  sodium chloride flush (NS) 0.9 % injection 3 mL (3 mLs Intravenous Given 12/24/21 0403)  sodium chloride flush (NS) 0.9 % injection 3 mL (has no administration in time range)  0.9 %  sodium chloride infusion (has no administration in time range)  acetaminophen (TYLENOL) tablet 650 mg (has no administration in time range)  ondansetron (ZOFRAN) injection 4 mg (has no administration in time range)  enoxaparin (LOVENOX) injection 40 mg (has no administration in time range)  furosemide (LASIX) injection 40 mg (has no administration in time range)  insulin aspart (novoLOG) injection 0-15 Units (has no administration in time range)  insulin aspart (novoLOG) injection 0-5 Units (has no administration in time range)  ipratropium-albuterol (DUONEB) 0.5-2.5 (3) MG/3ML nebulizer solution 3 mL (3 mLs Nebulization Given 12/24/21 0008)  furosemide (LASIX) injection 40 mg (40 mg Intravenous Given 12/24/21 0312)  ipratropium-albuterol (DUONEB) 0.5-2.5 (3) MG/3ML nebulizer solution 3 mL (3 mLs Nebulization Given 12/24/21 0308)  ipratropium-albuterol (DUONEB) 0.5-2.5 (3) MG/3ML nebulizer solution 3 mL (3 mLs Nebulization Given 12/24/21 0308)  methylPREDNISolone sodium succinate (SOLU-MEDROL) 125 mg/2 mL injection 125 mg (125 mg Intravenous Given 12/24/21 0311)     IMPRESSION / MDM / ASSESSMENT AND PLAN / ED COURSE  I reviewed the triage vital signs and the nursing notes.                              Differential diagnosis includes, but is not limited to, COPD exacerbation, CHF exacerbation, ACS, PE, pneumonia.  The patient is on the cardiac monitor to evaluate for evidence of arrhythmia and/or significant heart rate changes.  Patient improved in triage after a DuoNeb but is still feeling quite  short of breath and is in at least mild if not moderate respiratory distress.  I personally reviewed his chest x-ray and it is suggestive of interstitial edema.  No obvious ischemia on EKG.  Lab work ordered includes basic metabolic panel, high-sensitivity troponin, BNP, and CBC.  I reviewed the results and his BMP and CBC are unremarkable and his high-sensitivity troponin is within normal limits.  His BNP is elevated at nearly 800 although it has been significantly higher during acute episodes in the past.  However given his increased work of breathing, acute on chronic respiratory failure with increased oxygen requirement, and likely mixed  COPD and CHF exacerbation with interstitial edema on chest x-ray, I believe he would benefit from admission.  I discussed this with the patient and his wife and he strongly feels he needs to stay in the hospital rather than try outpatient treatment.  I treated with furosemide 40 mg IV to begin diuresis and 3 duo nebs in addition to Solu-Medrol 125 mg IV.  I will consult the hospitalist for admission.    Clinical Course as of 12/24/21 1610  Wynelle Link Dec 24, 2021  0255 I consulted Dr. Para March with the hospitalist service for admission.  We discussed the case and she agrees with the plan for admission as well as the plan of treatment that I initiated.  Urine [CF]    Clinical Course User Index [CF] Loleta Rose, MD     FINAL CLINICAL IMPRESSION(S) / ED DIAGNOSES   Final diagnoses:  Acute on chronic congestive heart failure, unspecified heart failure type (HCC)  COPD exacerbation (HCC)  Acute on chronic respiratory failure with hypoxia (HCC)     Rx / DC Orders   ED Discharge Orders     None        Note:  This document was prepared using Dragon voice recognition software and may include unintentional dictation errors.   Loleta Rose, MD 12/24/21 [phone]

## 2021-12-24 NOTE — ED Notes (Signed)
Patient sitting up eating lunch at this time.

## 2021-12-24 NOTE — Assessment & Plan Note (Signed)
-  Continue zetia, statin, aspirin, metoprolol, losartan

## 2021-12-24 NOTE — Evaluation (Signed)
Occupational Therapy Evaluation Patient Details Name: Harold Parker MRN: [id number] DOB: May 26, 1946 Today's Date: 12/24/2021   History of Present Illness Pt is a 76 y/o M admitted on 12/24/21 after presenting to the ED with 2 day hx of SOB, wheezing & cough producing yellow sputum. Pt is being treated for acute on chronic respiratory failure with hypoxia 2/2 COPD & CHF exacerbation. 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 2022   Clinical Impression   Pt seen for OT evaluation this date. Prior to hospital admission, pt was independent in all aspects of ADLs/IADLs and functional mobility, living with his fiance in a 1-story home with 4 steps to enter. Pt on 4L this session, with SpO2 88-93% throughout. Pt demonstrates baseline independence to perform ADLs and mobility tasks and no strength, sensory, coordination, cognitive, or visual deficits appreciated with assessment. No skilled OT needs identified at this time although pt expressed interest in pulmonary rehab. Upon discharge, recommend pulmonary rehab and no OT follow-up. OT will sign off. Please re-consult if additional OT needs arise.    Recommendations for follow up therapy are one component of a multi-disciplinary discharge planning process, led by the attending physician.  Recommendations may be updated based on patient status, additional functional criteria and insurance authorization.   Follow Up Recommendations  No OT follow up    Assistance Recommended at Discharge Set up Supervision/Assistance     Functional Status Assessment  Patient has had a recent decline in their functional status and demonstrates the ability to make significant improvements in function in a reasonable and predictable amount of time.  Equipment Recommendations  None recommended by OT    Recommendations for Other  Services Other (comment) (pulmonary rehab)     Precautions / Restrictions Precautions Precautions: None Restrictions Weight Bearing Restrictions: No      Mobility Bed Mobility Overal bed mobility: Modified Independent             General bed mobility comments: with HOB elevated, able to perform with ease    Transfers Overall transfer level: Modified independent Equipment used: None                      Balance Overall balance assessment: Mild deficits observed, not formally tested                                         ADL either performed or assessed with clinical judgement   ADL Overall ADL's : Modified independent                                       General ADL Comments: Pt donned socks, transferred to toilet, and performed functional mobility MOD-I     Vision Patient Visual Report: No change from baseline              Pertinent Vitals/Pain Pain Assessment: No/denies pain        Extremity/Trunk Assessment Upper Extremity Assessment Upper Extremity Assessment: Overall WFL for tasks assessed   Lower Extremity Assessment Lower Extremity Assessment: Overall WFL for tasks assessed       Communication Communication Communication: No difficulties   Cognition Arousal/Alertness: Awake/alert Behavior During  Therapy: WFL for tasks assessed/performed Overall Cognitive Status: Within Functional Limits for tasks assessed                                       General Comments  SpO2 > 92% throughout    Exercises Other Exercises Other Exercises: Pt educated in energy conservation strategies including pursed lip breathing, activity pacing, home/routines modifications, work simplification, AE/DME, prioritizing of meaningful occupations, and falls prevention.  Handout provided and pt verbalized understanding    Home Living Family/patient expects to be discharged to:: Private residence Living  Arrangements: Spouse/significant other (fiance) Available Help at Discharge: Family;Available 24 hours/day Type of Home: House Home Access: Stairs to enter [employer] of Steps: 4 Entrance Stairs-Rails: Right;Left;Can reach both Home Layout: One level               Home Equipment: Shower seat   Additional Comments: most recently patient has been on 3-4 L of O2 at home      Prior Functioning/Environment Prior Level of Function : Independent/Modified Independent             Mobility Comments: Pt reports independence with gait (pushes his O2 tank at home PRN), no falls in past 6 months. ADLs Comments: Pt reports independence with ADLs. Pt cooks        OT Problem List: Cardiopulmonary status limiting activity;Decreased activity tolerance         OT Goals(Current goals can be found in the care plan section) Acute Rehab OT Goals OT Goal Formulation: All assessment and education complete, DC therapy  OT Frequency:         AM-PAC OT "6 Clicks" Daily Activity     Outcome Measure Help from another person eating meals?: None Help from another person taking care of personal grooming?: None Help from another person toileting, which includes using toliet, bedpan, or urinal?: None Help from another person bathing (including washing, rinsing, drying)?: None Help from another person to put on and taking off regular upper body clothing?: None Help from another person to put on and taking off regular lower body clothing?: None 6 Click Score: 24   End of Session Equipment Utilized During Treatment: Oxygen Nurse Communication: Mobility status  Activity Tolerance: Patient tolerated treatment well Patient left: in bed;with call bell/phone within reach  OT Visit Diagnosis: Unsteadiness on feet (R26.81)                Time: [id number] OT Time Calculation (min): 22 min Charges:  OT General Charges $OT Visit: 1 Visit OT Evaluation $OT Eval Moderate Complexity: 1  Mod OT Treatments $Self Care/Home Management : 8-22 mins  Fredirick Maudlin, OTR/L [place]

## 2021-12-24 NOTE — Progress Notes (Signed)
°  Progress Note   Patient: Harold Parker [id number] DOB: 1946/02/07 DOA: 12/24/2021     0 DOS: the patient was seen and examined on 12/24/2021   Brief hospital course: Mr. Butz is a 76 y.o. M with hx CAD s/p CABG, aortic stenosis s/p bioprosthetic valve replacement, sCHF EF 30 to 35% November 2022, stage III severe COPD on chronic O2 at 3 to 4 L/min, followed by pulmonology, HTN, PAD, DM presents to the ED by EMS with several days progressive SOB and bad orthopnea.  Symptoms have been slowly worsening since his COVID hospitalization 1-2 months ago.   ED course: Tachypneic to 22, Troponin 19-17 and BNP 798   Chest x-ray showing diffuse bilateral interstitial pulmonary opacities likely edema.  No focal airspace opacity      Assessment and Plan * Acute on chronic respiratory failure with hypoxia (HCC)- (present on admission)    Acute on chronic systolic CHF (congestive heart failure) ([place])- (present on admission) -Furosemide 40 mg IV twice a day  -K supplement -Strict I/Os, daily weights, telemetry  -Daily monitoring renal function -Follow echo    COPD with acute exacerbation ([place])- (present on admission) With increased sputum and wheezing at home.  -Continue steroids -Continue bronchodilators -Continue azithromycin -Continue home ICS/LAMA/LABA  CAD (coronary artery disease)- (present on admission) -Continue zetia, statin, aspirin, metoprolol, losartan  Type II diabetes mellitus (HCC) GLucoses normal here. -Continue SS corrections -Hold glipizide, metformin     This is a no charge note, for further details, please see HH&P by Dr. Damita Dunnings from [place] today.    Author: Edwin Dada, MD 12/24/2021 10:39 AM  For on call review [email or web address].

## 2021-12-24 NOTE — ED Notes (Signed)
Pt provided with recliner for comfort.

## 2021-12-24 NOTE — Assessment & Plan Note (Signed)
No evidence of flare. -Continue home ICS/LAMA/LABA

## 2021-12-24 NOTE — ED Notes (Signed)
OT at bedside with patient. 

## 2021-12-25 DIAGNOSIS — Z794 Long term (current) use of insulin: Secondary | ICD-10-CM

## 2021-12-25 DIAGNOSIS — Z953 Presence of xenogenic heart valve: Secondary | ICD-10-CM

## 2021-12-25 DIAGNOSIS — I509 Heart failure, unspecified: Secondary | ICD-10-CM

## 2021-12-25 DIAGNOSIS — J441 Chronic obstructive pulmonary disease with (acute) exacerbation: Secondary | ICD-10-CM

## 2021-12-25 DIAGNOSIS — E119 Type 2 diabetes mellitus without complications: Secondary | ICD-10-CM

## 2021-12-25 LAB — BASIC METABOLIC PANEL
Anion gap: 8 (ref 5–15)
BUN: 41 mg/dL — ABNORMAL HIGH (ref 8–23)
CO2: 24 mmol/L (ref 22–32)
Calcium: 9.1 mg/dL (ref 8.9–10.3)
Chloride: 104 mmol/L (ref 98–111)
Creatinine, Ser: 1.54 mg/dL — ABNORMAL HIGH (ref 0.61–1.24)
GFR, Estimated: 46 mL/min — ABNORMAL LOW (ref >60)
Glucose, Bld: 254 mg/dL — ABNORMAL HIGH (ref 70–99)
Potassium: 4.4 mmol/L (ref 3.5–5.1)
Sodium: 136 mmol/L (ref 135–145)

## 2021-12-25 LAB — GLUCOSE, CAPILLARY
Glucose-Capillary: 260 mg/dL — ABNORMAL HIGH (ref 70–99)
Glucose-Capillary: 142 mg/dL — ABNORMAL HIGH (ref 70–99)

## 2021-12-25 MED ORDER — PREDNISONE 10 MG PO TABS: 40.0000 mg | ORAL_TABLET | Freq: Every day | ORAL | 0 refills | Status: AC

## 2021-12-25 MED ORDER — GLIPIZIDE 5 MG PO TABS: 5.0000 mg | ORAL_TABLET | Freq: Two times a day (BID) | ORAL | Status: AC

## 2021-12-25 MED ORDER — AZITHROMYCIN 250 MG PO TABS: 250.0000 mg | ORAL_TABLET | Freq: Every day | ORAL | 0 refills | Status: AC

## 2021-12-25 MED ORDER — ORAL CARE MOUTH RINSE
15.0000 mL | Freq: Two times a day (BID) | OROMUCOSAL | Status: DC
  Administered 2021-12-25: 15 mL via OROMUCOSAL

## 2021-12-25 NOTE — TOC Initial Note (Signed)
Transition of Care [facility]) - Initial/Assessment Note    Patient Details  Name: Harold Parker MRN: [id number] Date of Birth: 12/21/1945  [facility] of [facility]) CM/SW Contact:    Beverly Sessions, RN Phone Number: 12/25/2021, 9:31 AM  Clinical Narrative:                  Delmar Landau Heart failure navigator notified of heart failure consult PT and OT not recommending any services or DME at discharge        Patient Goals and CMS Choice        Expected Discharge Plan and Services           Expected Discharge Date: 12/25/21                                    Prior Living Arrangements/Services                       Activities of Daily Living Home Assistive Devices/Equipment: None ADL Screening (condition at time of admission) Patient's cognitive ability adequate to safely complete daily activities?: Yes Is the patient deaf or have difficulty hearing?: No Does the patient have difficulty seeing, even when wearing glasses/contacts?: No Does the patient have difficulty concentrating, remembering, or making decisions?: No Patient able to express need for assistance with ADLs?: Yes Does the patient have difficulty dressing or bathing?: No Independently performs ADLs?: Yes (appropriate for developmental age) Does the patient have difficulty walking or climbing stairs?: No Weakness of Legs: Both Weakness of Arms/Hands: None  Permission Sought/Granted                  Emotional Assessment              Admission diagnosis:  Acute respiratory failure ([place]) [J96.00] COPD exacerbation ([place]) [J44.1] Acute on chronic respiratory failure with hypoxia ([place]) [J96.21] Acute on chronic congestive heart failure, unspecified heart failure type [facility]) [I50.9] Patient Active Problem List   Diagnosis Date Noted   Acute on chronic congestive heart failure ([place])    Pressure injury of skin 0000000   Acute metabolic acidosis 0000000   COVID-19 10/31/2021    Chronic respiratory failure with hypoxia ([place]) 05/02/2021   Insomnia    History of COVID-19 pneumonia 01/11/21 02/04/2021   Pulmonary nodules    Acute respiratory disease due to COVID-19 virus 01/11/2021   HTN (hypertension)    Acute on chronic systolic CHF (congestive heart failure) (HCC)    COPD (chronic obstructive pulmonary disease) ([place])    GERD (gastroesophageal reflux disease)    Anxiety    AKI (acute kidney injury) ([place])    Hyperkalemia    Elevated troponin    Acute hypoxemic respiratory failure due to COVID-19 ([place])    Melena    Duodenal ulceration    Gastrointestinal hemorrhage    Severe anemia 03/14/2018   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   S/P aortic valve replacement with bioprosthetic valve  02/19/2018   Preoperative respiratory examination 02/13/2018   Acute on chronic respiratory failure with hypoxia ([place]) 02/11/2018   Influenza with respiratory manifestation 02/11/2018   Stage 3 severe COPD by GOLD classification ([place])    Aortic stenosis    Bronchitis, chronic obstructive, with exacerbation (HCC)    Tobacco abuse    CAD (coronary artery disease) [id number]   Acute systolic CHF (congestive heart failure) ([place]) 02/07/2018   Ischemic cardiomyopathy 02/07/2018  Insulin dependent type 2 diabetes mellitus ([place]) 02/07/2018   Hyperlipidemia LDL goal <70 02/07/2018   COPD with acute exacerbation ([place]) 02/05/2018   NSTEMI (non-ST elevated myocardial infarction) ([place]) 02/05/2018   PCP:  Lajean Manes, MD Pharmacy:   [id number] [address], [place] [address] Phone: [phone] Fax: [phone]     Social Determinants of Health (SDOH) Interventions    Readmission Risk Interventions Readmission Risk Prevention Plan 11/06/2021 02/06/2021 01/13/2021  Transportation Screening Complete Complete Complete  PCP or Specialist Appt within 3-5 Days Complete Complete Complete  HRI or Home Care Consult Complete Complete Complete   Social Work Consult for [place] Planning/Counseling Not Complete Complete Complete  SW consult not completed comments RNCM assigned to case - -  Palliative Care Screening Not Applicable Not Applicable Not Applicable  Medication Review (RN Care Manager) Complete Complete Complete  Some recent data might be hidden

## 2021-12-25 NOTE — Consult Note (Addendum)
° °  Heart Failure Nurse Navigator Note  He presented to the emergency room with complaints of several days of worsening shortness of breath.  HFrEF 35 to 40%.  Grade 1 diastolic dysfunction.  .  Comorbidities:  Coronary artery disease with coronary artery bypass grafting Aortic stenosis with bioprosthetic aortic valve replacement COPD Hypertension Diabetes  Medications:  Amlodipine 10 mg Aspirin 81 mg daily Setia 10 mg at bedtime Furosemide 40 mg IV twice a day Losartan 100 mg daily Metoprolol succinate 50 mg daily Crestor 10 mg daily  Labs:  BNP on admission 798, sodium 136, potassium 4.4, chloride 104, CO2 24, BUN 41, creatinine 1.56 Intake not documented Output 1810 mL Weight not documented Blood pressure 133/63   Initial meeting with patient, his fiance Arbie Cookey was not at the bedside.  He states that he is familiar with the term heart failure and understands as it is heart does not pump like it should.  He states that he does use salt on some foods like hamburger and steak.  Discussed the importance of removing the saltshaker completely from the table and relying on natural spices or things like Mrs. Dash.He is [place] and likes to make his own soups and sauces for pasta- they read labels making sure low sodium  Also discussed not using salt substitute that contains potassium chloride.  He admits to weighing himself every 3 to 4 days.  Went over the reasoning for daily weights recording weight, time and zone that he felt that he was in.  He voices understanding.  Also discussed signs and symptoms to report to physician.  Also discussed fluid restriction, he is drinks a couple 8 ounce cups of coffee in the morning and two 8 ounce cranberry juice and then a bottled water but states that he did not feel he goes over 64 ounces in a days time.  He was given an appointment in the outpatient heart failure clinic on January 19 at 12 PM.  He has a 17% of no-show ratio which is 24  out of 138 appointments.  Also discussed that once he is established with Otila Kluver that he could call her with questions or concerns.  Was also given a red medication back for bring in his medications to his appointment in the clinic.  He had no further questions.  He was given the living with heart failure teaching booklet, zone magnet and information on low-sodium.  Pricilla Riffle RN CHFN

## 2021-12-25 NOTE — Progress Notes (Incomplete)
PROGRESS NOTE  Harold Parker    DOB: May 15, 1946, 76 y.o.  WCH:[id number]  PCP: Merlene Laughter, MD   Code Status: DNR   DOA: 12/24/2021   LOS: 1  Brief Narrative of Current Hospitalization  Harold Parker is a 76 y.o. male with a PMH significant for ***. They presented from *** to the ED on 12/24/2021 with *** x***days. In the ED, it was found that they had ***. They were treated with ***.  Patient was admitted to medicine service for further workup and management of *** as outlined in detail below.  12/25/21 -***  Assessment & Plan  Principal Problem:   Acute on chronic respiratory failure with hypoxia (HCC) Active Problems:   COPD with acute exacerbation (HCC)   CAD (coronary artery disease)   Type II diabetes mellitus (HCC)   S/P aortic valve replacement with bioprosthetic valve    Acute on chronic systolic CHF (congestive heart failure) (HCC)  Acute on chronic respiratory failure with hypoxia (HCC)- (present on admission)  chronically on 3-4L O2 at baseline   Acute on chronic systolic CHF (congestive heart failure) (HCC)- (present on admission) -Furosemide 40 mg IV twice a day  -K supplement -Strict I/Os, daily weights, telemetry  -Daily monitoring renal function -Follow echo   COPD with acute exacerbation (HCC)- (present on admission) With increased sputum and wheezing at home.  -Continue steroids -Continue bronchodilators -Continue azithromycin -Continue home ICS/LAMA/LABA   CAD (coronary artery disease)- (present on admission) -Continue zetia, statin, aspirin, metoprolol, losartan   Type II diabetes mellitus (HCC) GLucoses normal here. -Continue SS corrections -Hold glipizide, metformin  DVT prophylaxis: enoxaparin (LOVENOX) injection 40 mg Start: 12/24/21 1000   Diet:  Diet Orders (From admission, onward)     Start     Ordered   12/24/21 0324  Diet heart healthy/carb modified Room service appropriate? Yes; Fluid consistency: Thin  Diet effective now        Question Answer Comment  Diet-HS Snack? Nothing   Room service appropriate? Yes   Fluid consistency: Thin      12/24/21 0324            Subjective 12/25/21    Pt reports ***  Disposition Plan & Communication  Patient status: Inpatient  Admitted From: {From:23814} Disposition: {PLAN; DISPOSITION:26386} Anticipated discharge date: ***  Family Communication: ***  Consults, Procedures, Significant Events  Consultants:  ***  Procedures/significant events:  None***  Antimicrobials:  Anti-infectives (From admission, onward)    Start     Dose/Rate Route Frequency Ordered Stop   12/25/21 1000  azithromycin (ZITHROMAX) tablet 250 mg       See Hyperspace for full Linked Orders Report.   250 mg Oral Daily 12/24/21 1044 12/29/21 0959   12/24/21 1045  azithromycin (ZITHROMAX) tablet 500 mg       See Hyperspace for full Linked Orders Report.   500 mg Oral Daily 12/24/21 1044 12/24/21 1104       Objective   Vitals:   12/24/21 1715 12/24/21 1716 12/24/21 2002 12/25/21 0403  BP:   (!) 147/93 139/66  Pulse: 78 74 79 73  Resp: (!) 26 20 20 20   Temp:  98.2 F (36.8 C) 97.8 F (36.6 C) 98.1 F (36.7 C)  TempSrc:  Oral Oral Oral  SpO2: 93% 94% 93% 97%  Weight:      Height:        Intake/Output Summary (Last 24 hours) at 12/25/2021 0617 Last data filed at 12/25/2021 0440 Gross per 24 hour  Intake --  Output 1810 ml  Net -1810 ml   Filed Weights   12/24/21 0650  Weight: 77.1 kg    Patient BMI: Body mass index is 28.29 kg/m.   Physical Exam: *** General: awake, alert, NAD HEENT: atraumatic, clear conjunctiva, anicteric sclera, MMM, hearing grossly normal Respiratory: normal respiratory effort. Cardiovascular: normal S1/S2, RRR, no JVD, murmurs, quick capillary refill  Gastrointestinal: soft, NT, ND Nervous: A&O x3. no gross focal neurologic deficits, normal speech Extremities: moves all equally, no edema, normal tone Skin: dry, intact, normal temperature, normal  color. No rashes, lesions or ulcers on exposed skin Psychiatry: normal mood, congruent affect  Labs   I have personally reviewed following labs and imaging studies Admission on 12/24/2021  Component Date Value Ref Range Status   Sodium 12/23/2021 139  135 - 145 mmol/L Final   Potassium 12/23/2021 4.7  3.5 - 5.1 mmol/L Final   Chloride 12/23/2021 106  98 - 111 mmol/L Final   CO2 12/23/2021 24  22 - 32 mmol/L Final   Glucose, Bld 12/23/2021 122 (H)  70 - 99 mg/dL Final   BUN 64/40/3474 24 (H)  8 - 23 mg/dL Final   Creatinine, Ser 12/23/2021 1.14  0.61 - 1.24 mg/dL Final   Calcium 25/95/6387 9.5  8.9 - 10.3 mg/dL Final   GFR, Estimated 12/23/2021 >60  >60 mL/min Final   Anion gap 12/23/2021 9  5 - 15 Final   WBC 12/23/2021 10.7 (H)  4.0 - 10.5 K/uL Final   RBC 12/23/2021 5.25  4.22 - 5.81 MIL/uL Final   Hemoglobin 12/23/2021 15.9  13.0 - 17.0 g/dL Final   HCT 56/43/3295 47.1  39.0 - 52.0 % Final   MCV 12/23/2021 89.7  80.0 - 100.0 fL Final   MCH 12/23/2021 30.3  26.0 - 34.0 pg Final   MCHC 12/23/2021 33.8  30.0 - 36.0 g/dL Final   RDW 18/84/1660 14.6  11.5 - 15.5 % Final   Platelets 12/23/2021 200  150 - 400 K/uL Final   nRBC 12/23/2021 0.0  0.0 - 0.2 % Final   Troponin I (High Sensitivity) 12/23/2021 19 (H)  <18 ng/L Final   B Natriuretic Peptide 12/23/2021 798.2 (H)  0.0 - 100.0 pg/mL Final   Troponin I (High Sensitivity) 12/23/2021 17  <18 ng/L Final   HIV Screen 4th Generation wRfx 12/24/2021 Non Reactive  Non Reactive Final   WBC 12/24/2021 9.7  4.0 - 10.5 K/uL Final   RBC 12/24/2021 5.35  4.22 - 5.81 MIL/uL Final   Hemoglobin 12/24/2021 16.0  13.0 - 17.0 g/dL Final   HCT 63/12/6008 46.3  39.0 - 52.0 % Final   MCV 12/24/2021 86.5  80.0 - 100.0 fL Final   MCH 12/24/2021 29.9  26.0 - 34.0 pg Final   MCHC 12/24/2021 34.6  30.0 - 36.0 g/dL Final   RDW 93/23/5573 14.4  11.5 - 15.5 % Final   Platelets 12/24/2021 196  150 - 400 K/uL Final   nRBC 12/24/2021 0.0  0.0 - 0.2 % Final    Creatinine, Ser 12/24/2021 1.06  0.61 - 1.24 mg/dL Final   GFR, Estimated 12/24/2021 >60  >60 mL/min Final   Glucose-Capillary 12/24/2021 303 (H)  70 - 99 mg/dL Final   Glucose-Capillary 12/24/2021 300 (H)  70 - 99 mg/dL Final   Sodium 22/01/5426 136  135 - 145 mmol/L Final   Potassium 12/25/2021 4.4  3.5 - 5.1 mmol/L Final   Chloride 12/25/2021 104  98 - 111 mmol/L Final  CO2 12/25/2021 24  22 - 32 mmol/L Final   Glucose, Bld 12/25/2021 254 (H)  70 - 99 mg/dL Final   BUN 31/51/7616 41 (H)  8 - 23 mg/dL Final   Creatinine, Ser 12/25/2021 1.54 (H)  0.61 - 1.24 mg/dL Final   Calcium 07/37/1062 9.1  8.9 - 10.3 mg/dL Final   GFR, Estimated 12/25/2021 46 (L)  >60 mL/min Final   Anion gap 12/25/2021 8  5 - 15 Final   Glucose-Capillary 12/24/2021 293 (H)  70 - 99 mg/dL Final   Glucose-Capillary 12/24/2021 283 (H)  70 - 99 mg/dL Final    Imaging Studies  DG Chest 2 View  Result Date: 12/23/2021 CLINICAL DATA:  Shortness of breath EXAM: CHEST - 2 VIEW COMPARISON:  11/02/2021 FINDINGS: 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 Aortic valve prosthesis. Mild, diffuse bilateral interstitial pulmonary opacity. The visualized skeletal structures are unremarkable. IMPRESSION: Cardiomegaly with mild, diffuse bilateral interstitial pulmonary opacity, likely edema. No focal airspace opacity. Electronically Signed   By: Jearld Lesch M.D.   On: 12/23/2021 15:00    Medications   Scheduled Meds:  ALPRAZolam  0.5 mg Oral BID   amLODipine  10 mg Oral Daily   arformoterol  15 mcg Nebulization Q12H   aspirin EC  81 mg Oral Daily   azithromycin  250 mg Oral Daily   budesonide  0.5 mg Nebulization BID   busPIRone  10 mg Oral BID   enoxaparin (LOVENOX) injection  40 mg Subcutaneous Q24H   ezetimibe  10 mg Oral QHS   furosemide  40 mg Intravenous BID   insulin aspart  0-15 Units Subcutaneous TID WC   insulin aspart  0-5 Units Subcutaneous QHS   losartan  100 mg Oral Daily   mouth rinse  15 mL Mouth  Rinse BID   methylPREDNISolone (SOLU-MEDROL) injection  40 mg Intravenous Daily   metoprolol succinate  50 mg Oral Daily   pantoprazole  40 mg Oral Daily   revefenacin  175 mcg Nebulization Daily   rosuvastatin  10 mg Oral Daily   sodium chloride flush  3 mL Intravenous Q12H   No recently discontinued medications to reconcile  LOS: 1 day   Leeroy Bock, DO [facility] 12/25/2021, 6:17 AM   Available by Epic secure chat 7AM-7PM. If 7PM-7AM, please contact night-coverage Refer to [email or web address] to contact the [facility] Attending or Consulting provider for this pt

## 2022-01-02 ENCOUNTER — Other Ambulatory Visit: Payer: Self-pay

## 2022-01-02 ENCOUNTER — Ambulatory Visit (INDEPENDENT_AMBULATORY_CARE_PROVIDER_SITE_OTHER): Payer: Medicare Other

## 2022-01-02 DIAGNOSIS — I739 Peripheral vascular disease, unspecified: Secondary | ICD-10-CM

## 2022-01-02 NOTE — Discharge Summary (Signed)
Physician Discharge Summary  Jasaun Carn KCL:[id number] DOB: 1946-11-27 DOA: 12/24/2021  PCP: Merlene Laughter, MD  Admit date: 12/24/2021 Discharge date: 12/25/2021  Admitted From: Home Disposition: Home  Recommendations for Outpatient Follow-up:  Follow up with PCP within 1-2 weeks Follow up with pulmonology  Discharge Condition:stable, improved CODE STATUS:  Code Status: Prior  Regular healthy diet  Brief/Interim Summary: Pt presented to ED with shortness of breath and increased oxygen requirement above his baseline of 3-4L O2. He had a recent hospitalization from COVID but was COVID negative this admission. His hypoxia was treated as a multifactorial cause of acute congestive heart failure exacerbation and COPD exacerbation.  He received diuresis and breathing treatments as well as steroids which were continued at discharge. He made rapid recovery to his baseline oxygen requirement and was feeling at his baseline so was discharged on second day of admission.  PT and OT evaluated and did not recommend any follow up.  Discharge Diagnoses:  Principal Problem:   Acute on chronic respiratory failure with hypoxia (HCC) Active Problems:   COPD with acute exacerbation (HCC)   CAD (coronary artery disease)   Insulin dependent type 2 diabetes mellitus (HCC)   S/P aortic valve replacement with bioprosthetic valve    Acute on chronic systolic CHF (congestive heart failure) (HCC)   Acute on chronic congestive heart failure (HCC)    Allergies as of 12/25/2021       Reactions   Atorvastatin Other (See Comments)   Leg aches and weakness        Medication List     TAKE these medications    acetaminophen 500 MG tablet Commonly known as: TYLENOL Take 1,000 mg by mouth daily as needed for moderate pain or headache.   ALPRAZolam 0.5 MG tablet Commonly known as: XANAX Take 0.5 mg by mouth 2 (two) times daily.   amLODipine 10 MG tablet Commonly known as: NORVASC Take 1 tablet (10 mg  total) by mouth daily.   aspirin EC 81 MG tablet Take 81 mg by mouth daily.   budesonide 0.5 MG/2ML nebulizer solution Commonly known as: PULMICORT Take 2 mLs (0.5 mg total) by nebulization 2 (two) times daily. Use AFTER Perforomist   busPIRone 10 MG tablet Commonly known as: BUSPAR Take 10 mg by mouth 2 (two) times daily.   Compressor/Nebulizer Misc Use as directed   esomeprazole 40 MG capsule Commonly known as: NEXIUM Take 40 mg by mouth at bedtime.   ezetimibe 10 MG tablet Commonly known as: ZETIA Take 10 mg by mouth at bedtime.   formoterol 20 MCG/2ML nebulizer solution Commonly known as: PERFOROMIST Take 2 mLs (20 mcg total) by nebulization 2 (two) times daily.   furosemide 40 MG tablet Commonly known as: LASIX Take 1 tablet (40 mg total) by mouth 2 (two) times daily. What changed: when to take this   glipiZIDE 5 MG tablet Commonly known as: GLUCOTROL Take 1 tablet (5 mg total) by mouth 2 (two) times daily before a meal. Take 5 mg in the morning and take a second 5 mg dose at night on Mon, Wed, and Fri   guaiFENesin 600 MG 12 hr tablet Commonly known as: MUCINEX Take 1,200 mg by mouth every 12 (twelve) hours as needed.   losartan 100 MG tablet Commonly known as: COZAAR Take 100 mg by mouth daily.   metFORMIN 1000 MG tablet Commonly known as: GLUCOPHAGE TAKE 1 TABLET BY MOUTH TWICE DAILY WITH A MEAL   metoprolol succinate 50 MG 24 hr tablet  Commonly known as: TOPROL-XL Take 1 tablet (50 mg total) by mouth daily.   ProAir HFA 108 (90 Base) MCG/ACT inhaler Generic drug: albuterol Inhale 1 puff into the lungs every 4 (four) hours as needed for wheezing or shortness of breath.   albuterol (2.5 MG/3ML) 0.083% nebulizer solution Commonly known as: PROVENTIL Take 3 mLs (2.5 mg total) by nebulization every 6 (six) hours as needed for wheezing or shortness of breath.   revefenacin 175 MCG/3ML nebulizer solution Commonly known as: YUPELRI Take 3 mLs (175 mcg  total) by nebulization daily. Can be mixed with Perforomist   rosuvastatin 10 MG tablet Commonly known as: CRESTOR Take 1 tablet (10 mg total) by mouth daily.   traZODone 50 MG tablet Commonly known as: DESYREL Take 50 mg by mouth at bedtime as needed.   Vitamin D (Cholecalciferol) 25 MCG (1000 UT) Tabs Take 1,000 Units by mouth every morning.       ASK your doctor about these medications    azithromycin 250 MG tablet Commonly known as: ZITHROMAX Take 1 tablet (250 mg total) by mouth daily for 3 days. Ask about: Should I take this medication?   predniSONE 10 MG tablet Commonly known as: DELTASONE Take 4 tablets (40 mg total) by mouth daily for 4 days. Ask about: Should I take this medication?        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 Aortic valve prosthesis. Mild, diffuse bilateral interstitial pulmonary opacity. The visualized skeletal structures are unremarkable. IMPRESSION: Cardiomegaly with mild, diffuse bilateral interstitial pulmonary opacity, likely edema. No focal airspace opacity. Electronically Signed   By: Jearld Lesch M.D.   On: 12/23/2021 15:00   VAS [place] ABI WITH/WO TBI  Result Date: 01/02/2022  LOWER EXTREMITY DOPPLER STUDY Patient Name:  NAMARI DOSWELL  Date of Exam:   01/02/2022 Medical Rec #: [id number]      Accession #:    [id number] Date of Birth: 1946-03-11      Patient Gender: M Patient Age:   76 years Exam Location:  [place] Procedure:      VAS [place] ABI WITH/WO TBI Referring Phys: [facility] ARIDA --------------------------------------------------------------------------------  Indications: Peripheral artery disease, and Follow-up to left distal SFA PTA.               Known right SFA occlusion. High Risk Factors: Hypertension, hyperlipidemia, past history of smoking, prior                    MI, coronary artery disease. Other Factors: Patient does a fair amount of walking and denies all                claudication.                S/P Covid mid-November 2022. This was his second time he's had                Covid.  Vascular Interventions: Left distal SFA PTA, 03/04/19. Comparison       Arterial studies on 09/30/20 reported moderate disease as per Study:           ABI's  SEE DUPLEX EXAM OF THE SAME DATE Performing Technologist: Quentin Ore RVT  Examination Guidelines: A complete evaluation includes at minimum, Doppler waveform signals and systolic blood pressure reading at the level of bilateral brachial, anterior tibial, and posterior tibial arteries, when vessel segments are accessible. Bilateral testing is considered an integral part of a complete examination. Photoelectric Plethysmograph (PPG) waveforms and toe systolic pressure readings are included as required and additional duplex testing as needed. Limited examinations for reoccurring indications may be performed as noted.  ABI Findings: +---------+------------------+-----+----------+---------------------+  Right     Rt Pressure (mmHg) Index Waveform   Comment                +---------+------------------+-----+----------+---------------------+  Brachial  146                      triphasic                         +---------+------------------+-----+----------+---------------------+  PTA       120                0.82  monophasic                        +---------+------------------+-----+----------+---------------------+  PERO      114                0.76  monophasic                        +---------+------------------+-----+----------+---------------------+  DP        112                0.77             multiphasic waveforms  +---------+------------------+-----+----------+---------------------+  Great Toe 50                  0.34  Abnormal                          +---------+------------------+-----+----------+---------------------+ +---------+------------------+-----+----------+-------+  Left      Lt Pressure (mmHg) Index Waveform   Comment  +---------+------------------+-----+----------+-------+  Brachial  142                                          +---------+------------------+-----+----------+-------+  PTA       99                 0.68  monophasic          +---------+------------------+-----+----------+-------+  PERO      120                0.82  triphasic           +---------+------------------+-----+----------+-------+  DP        122                0.84  monophasic          +---------+------------------+-----+----------+-------+  Great Toe 56                 0.38  Abnormal            +---------+------------------+-----+----------+-------+ +-------+-----------+-----------+------------+------------+  ABI/TBI Today's ABI Today's TBI Previous ABI Previous TBI  +-------+-----------+-----------+------------+------------+  Right   0.82  0.34        0.73         0.36          +-------+-----------+-----------+------------+------------+  Left    0.84        0.38        0.74         not detected  +-------+-----------+-----------+------------+------------+ Bilateral ABIs appear increased compared to prior study on 09/20/20. Left TBIs appear increased compared to prior study on 09/20/20.  Summary: Right: Resting right ankle-brachial index indicates mild right lower extremity arterial disease. The right toe-brachial index is abnormal. Left: Resting left ankle-brachial index indicates mild left lower extremity arterial disease. The left toe-brachial index is abnormal.  *See table(s) above for measurements and observations.  Suggest follow up study in 12 months.    Preliminary     Subjective: Patient feels back to his baseline breathing status.   Discharge Exam: Vitals:   12/25/21 0403 12/25/21 0745  BP: 139/66 133/63   Pulse: 73 74  Resp: 20 18  Temp: 98.1 F (36.7 C) 97.8 F (36.6 C)  SpO2: 97% 95%    General: Pt is alert, awake, not in acute distress Cardiovascular: RRR, S1/S2 +, no rubs, no gallops Respiratory: CTA bilaterally, no wheezing, no rhonchi Abdominal: Soft, NT, ND, bowel sounds + Extremities: no edema, no cyanosis  Labs: Basic Metabolic Panel: BMP Latest Ref Rng & Units 12/25/2021 12/24/2021 12/23/2021  Glucose 70 - 99 mg/dL 161(W) - 960(A)  BUN 8 - 23 mg/dL 54(U) - 98(J)  Creatinine 0.61 - 1.24 mg/dL 1.91(Y) 7.82 9.56  BUN/Creat Ratio 10 - 24 - - -  Sodium 135 - 145 mmol/L 136 - 139  Potassium 3.5 - 5.1 mmol/L 4.4 - 4.7  Chloride 98 - 111 mmol/L 104 - 106  CO2 22 - 32 mmol/L 24 - 24  Calcium 8.9 - 10.3 mg/dL 9.1 - 9.5   CBC    Component Value Date/Time   WBC 9.7 12/24/2021 0700   RBC 5.35 12/24/2021 0700   HGB 16.0 12/24/2021 0700   HGB 12.1 (L) 03/27/2018 0900   HCT 46.3 12/24/2021 0700   HCT 37.7 03/27/2018 0900   PLT 196 12/24/2021 0700   PLT 275 03/27/2018 0900   MCV 86.5 12/24/2021 0700   MCV 89 03/27/2018 0900   MCH 29.9 12/24/2021 0700   MCHC 34.6 12/24/2021 0700   RDW 14.4 12/24/2021 0700   RDW 15.7 (H) 03/27/2018 0900   LYMPHSABS 2.1 10/31/2021 1335   LYMPHSABS 3.0 03/27/2018 0900   MONOABS 0.6 10/31/2021 1335   EOSABS 0.2 10/31/2021 1335   EOSABS 0.6 (H) 03/27/2018 0900   BASOSABS 0.1 10/31/2021 1335   BASOSABS 0.1 03/27/2018 0900   Microbiology No results found for this or any previous visit (from the past 240 hour(s)).  Time coordinating discharge: Over 30 minutes  Leeroy Bock, MD  [facility] 01/02/2022, 11:41 AM

## 2022-01-03 ENCOUNTER — Telehealth: Payer: Self-pay | Admitting: Pulmonary Disease

## 2022-01-03 NOTE — Progress Notes (Signed)
Patient ID: Harold Parker, male    DOB: 1946-08-21, 76 y.o.   MRN: [id number]  HPI  Harold Parker is a 76 y/o male with a history of CAD, DM, hyperlipidemia, HTN, COPD, chronic back pain, GIB, moderate aortic stenosis, anxiety, previous tobacco use and chronic heart failure.   Echo report from 11/01/21 reviewed and showed an EF of 35-40% along with mild LVH  LHC done 02/06/18 and showed: There is mild to moderate left ventricular systolic dysfunction. LV end diastolic pressure is severely elevated. Ost RCA to Prox RCA lesion is 100% stenosed. Mid LM to Dist LM lesion is 85% stenosed. Ost 1st Mrg lesion is 95% stenosed. Prox LAD to Mid LAD lesion is 40% stenosed. Prox LAD lesion is 50% stenosed. Ost 1st Diag to 1st Diag lesion is 60% stenosed.   1.  Severe left main and three-vessel coronary artery disease with chronically occluded right coronary artery with left-to-right collaterals.  The coronary arteries are overall heavily calcified and diffusely diseased. 2.  Mildly reduced LV systolic function with an EF of 40%.  Echocardiogram showed wall motion abnormalities in the anterior as well as inferior walls.  3.  Severely elevated filling pressures in the setting of severely elevated blood pressure. 4.  Moderate aortic stenosis with a peak to peak gradient of 20 mmHg.   Recommendations: Transfer to [facility] for CABG and aortic valve replacement.   Admitted 12/24/21 due to shortness of breath and hypoxia due to COPD/HF exacerbation. Initially given IV lasix with transition to oral diuretics. Steroids given with rapid improvement of symptoms. PT/OT evaluations done. Discharged the following day.   He presents today for his initial visit with a chief complaint of minimal shortness of breath with moderate exertion. Describes this as chronic in nature having been present for several years. Does worsen if he's rushing around or walking too fast. Has associated fatigue, light-headedness & chronic back  pain along with this. He denies any difficulty sleeping, abdominal distention, palpitations, pedal edema, chest pain, cough or weight gain.   He is drinking water and cranberry juice during the day. Already weighing daily and is not using any salt. Cooking with Mrs. Dash seasoning.   Past Medical History:  Diagnosis Date   Bilateral carotid bruits    a. 01/2018 U/S: < 50% bilat ICA stenosis.   CAD (coronary artery disease)    a. 1998 s/p MI and BMS [place], [place]); b. 1999 redo PCI/rotablator in setting of what sounds like ISR;  c. Multiple stress tests over the years - last ~ 2017, reportedly nl; d. 01/2018 NSTEMI/Cath: LM 31m/d, LAD 50p, 40p/m, D1 60ost, OM1 95, RCA 100ost/p w/ L->R collats, EF 45%; e. s/p 3V CABG 02/19/18 (LIMA-LAD, VG-D1, VG-OM)   CHF (congestive heart failure) (HCC)    Chronic lower back pain    COPD (chronic obstructive pulmonary disease) (HCC)    GIB (gastrointestinal bleeding)    a. 02/2018 GIB and anemia w/ Hgb of 4.7 on presentation; b. 03/2018 EGD: 2 nonbleeding duodenal ulcers.   HTN (hypertension)    Hypercholesteremia    Ischemic cardiomyopathy    a. 01/2018 Echo: EF 40-45%, mid-apicalanteroseptal, ant, apical sev HK, mod apicalinferior HK. Gr2 DD. Mod AS, mild Harold, mod dil LA, PASP .   Moderate aortic stenosis    a. 01/2018 Echo: Mod AS, mean grad (S) , Valve area (VTI) 1.06 cm^2, (Vmax) 1.27 cm^2; b. 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   LIMA to LAD, SVG to D1, SVG to OM, EVH via right thigh and leg   Tobacco abuse    Type II diabetes mellitus (HCC)    Past Surgical History:  Procedure Laterality Date   ABDOMINAL AORTOGRAM N/A 03/04/2019   Procedure: ABDOMINAL AORTOGRAM;  Surgeon: Iran Ouch, MD;  Location: [facility] INVASIVE CV LAB;   Service: Cardiovascular;  Laterality: N/A;   AORTIC VALVE REPLACEMENT N/A 02/19/2018   Procedure: AORTIC VALVE REPLACEMENT (AVR);  Surgeon: Purcell Nails, MD;  Location: [facility] OR;  Service: Open Heart Surgery;  Laterality: N/A;   COLONOSCOPY     CORONARY ANGIOPLASTY WITH STENT PLACEMENT  ~ 1998/1999   CORONARY ARTERY BYPASS GRAFT N/A 02/19/2018   Procedure: CORONARY ARTERY BYPASS GRAFTING (CABG) x three , using left internal mammary artery and right leg greater saphenous vein harvested endoscopically;  Surgeon: Purcell Nails, MD;  Location: [facility] OR;  Service: Open Heart Surgery;  Laterality: N/A;   ESOPHAGOGASTRODUODENOSCOPY (EGD) WITH PROPOFOL N/A 03/18/2018   Procedure: ESOPHAGOGASTRODUODENOSCOPY (EGD) WITH PROPOFOL;  Surgeon: Midge Minium, MD;  Location: [facility] ENDOSCOPY;  Service: Endoscopy;  Laterality: N/A;   LEFT HEART CATH AND CORONARY ANGIOGRAPHY N/A 02/06/2018   Procedure: LEFT HEART CATH AND CORONARY ANGIOGRAPHY;  Surgeon: Iran Ouch, MD;  Location: [facility] INVASIVE CV LAB;  Service: Cardiovascular;  Laterality: N/A;   LOWER EXTREMITY ANGIOGRAPHY Bilateral 03/04/2019   Procedure: Lower Extremity Angiography;  Surgeon: Iran Ouch, MD;  [facility] INVASIVE CV LAB;  Service: Cardiovascular;  Laterality: Bilateral;   PERIPHERAL VASCULAR ATHERECTOMY Left 03/04/2019   Procedure: PERIPHERAL VASCULAR ATHERECTOMY;  Surgeon: Iran Ouch, MD;  Location: [facility] INVASIVE CV LAB;  Service: Cardiovascular;  Laterality: Left;  SFA   TEE WITHOUT CARDIOVERSION N/A 02/19/2018   Procedure: TRANSESOPHAGEAL ECHOCARDIOGRAM (TEE);  Surgeon: Purcell Nails, MD;  Location[facility] OR;  Service: Open Heart Surgery;  Laterality: N/A;   TONSILLECTOMY     Family History  Problem Relation Age of Onset   Lymphoma Mother    Peripheral vascular disease Father    Social History   Tobacco Use   Smoking status: Former    Packs/day: 1.00    Years: 40.00    Pack years: 40.00    Types: Cigarettes    Quit date:  02/02/2018    Years since quitting: 3.9   Smokeless tobacco: Never   Tobacco comments:    Quit 01/2018 - on 05/01/2018 talked to Chadd about Relaspe concerns. He ststaes that he has no taste for tobacco since he quit in Feb.  Substance Use Topics   Alcohol use: Never    Comment: occ   Allergies  Allergen Reactions   Atorvastatin Other (See Comments)    Leg aches and weakness    Prior to Admission medications   Medication Sig Start Date End Date Taking? Authorizing Provider  acetaminophen (TYLENOL) 500 MG tablet Take 1,000 mg by mouth daily as needed for moderate pain or headache.   Yes [provider]  albuterol (PROVENTIL) (2.5 MG/3ML) 0.083% nebulizer solution Take 3 mLs (2.5 mg total) by nebulization every 6 (six) hours as needed for wheezing or shortness of breath. 03/01/21  Yes Salena Saner, MD  ALPRAZolam Prudy Feeler) 0.5 MG tablet Take 0.5 mg by mouth 2 (two) times daily.   Yes [provider]  amLODipine (  NORVASC) 10 MG tablet Take 1 tablet (10 mg total) by mouth daily. 11/06/21  Yes Esaw Grandchild A, DO  aspirin EC 81 MG tablet Take 81 mg by mouth daily.   Yes [provider]  budesonide (PULMICORT) 0.5 MG/2ML nebulizer solution Take 2 mLs (0.5 mg total) by nebulization 2 (two) times daily. Use AFTER Perforomist 03/01/21  Yes Salena Saner, MD  busPIRone (BUSPAR) 10 MG tablet Take 10 mg by mouth 2 (two) times daily. 04/15/21  Yes [provider]  esomeprazole (NEXIUM) 40 MG capsule Take 40 mg by mouth at bedtime.    Yes [provider]  ezetimibe (ZETIA) 10 MG tablet Take 10 mg by mouth at bedtime.    Yes [provider]  formoterol (PERFOROMIST) 20 MCG/2ML nebulizer solution Take 2 mLs (20 mcg total) by nebulization 2 (two) times daily. 03/01/21  Yes Salena Saner, MD  furosemide (LASIX) 40 MG tablet Take 1 tablet (40 mg total) by mouth 2 (two) times daily. 11/22/21  Yes Iran Ouch, MD  glipiZIDE (GLUCOTROL) 5 MG  tablet Take 1 tablet (5 mg total) by mouth 2 (two) times daily before a meal. Take 5 mg in the morning and take a second 5 mg dose at night on Mon, Wed, and Fri Patient taking differently: Take 5 mg by mouth 2 (two) times daily before a meal. 12/25/21  Yes Leeroy Bock, MD  guaiFENesin (MUCINEX) 600 MG 12 hr tablet Take 1,200 mg by mouth every 12 (twelve) hours as needed. 01/23/21  Yes [provider]  losartan (COZAAR) 50 MG tablet Take 1 tablet by mouth daily.   Yes [provider]  metFORMIN (GLUCOPHAGE) 1000 MG tablet TAKE 1 TABLET BY MOUTH TWICE DAILY WITH A MEAL 04/27/18  Yes Lovett Sox, MD  metoprolol succinate (TOPROL-XL) 50 MG 24 hr tablet Take 1 tablet (50 mg total) by mouth daily. 05/26/21  Yes Iran Ouch, MD  Nebulizers (COMPRESSOR/NEBULIZER) MISC Use as directed 10/26/21  Yes Salena Saner, MD  PROAIR HFA 108 [phone] Base) MCG/ACT inhaler Inhale 1 puff into the lungs every 4 (four) hours as needed for wheezing or shortness of breath. 02/04/19  Yes [provider]  revefenacin (YUPELRI) 175 MCG/3ML nebulizer solution Take 3 mLs (175 mcg total) by nebulization daily. Can be mixed with Perforomist 03/01/21  Yes Salena Saner, MD  rosuvastatin (CRESTOR) 10 MG tablet Take 1 tablet (10 mg total) by mouth daily. 11/24/21 02/22/22 Yes Iran Ouch, MD  traZODone (DESYREL) 50 MG tablet Take 50 mg by mouth at bedtime as needed. 02/09/21  Yes [provider]  Vitamin D, Cholecalciferol, 1000 units TABS Take 1,000 Units by mouth every morning.    Yes [provider]   Review of Systems  Constitutional:  Positive for fatigue. Negative for appetite change.  HENT:  Negative for congestion, postnasal drip and sore throat.   Eyes: Negative.   Respiratory:  Positive for shortness of breath (with moderate exertion). Negative for cough.   Cardiovascular:  Negative for chest pain, palpitations and leg swelling.  Gastrointestinal:  Negative for  abdominal distention and abdominal pain.  Endocrine: Negative.   Genitourinary: Negative.   Musculoskeletal:  Positive for back pain. Negative for neck pain.  Skin: Negative.   Allergic/Immunologic: Negative.   Neurological:  Positive for light-headedness (when changing positions too quickly). Negative for dizziness.  Hematological:  Negative for adenopathy. Does not bruise/bleed easily.  Psychiatric/Behavioral:  Negative for dysphoric mood and sleep disturbance (  sleeping on 1-2 pillows with oxygen). The patient is not nervous/anxious.    Vitals:   01/04/22 1204  BP: (!) 154/85  Pulse: 91  Resp: 20  SpO2: 100%  Weight: 169 lb (76.7 kg)  Height: 5\' 5"  (1.651 m)   Wt Readings from Last 3 Encounters:  01/04/22 169 lb (76.7 kg)  12/24/21 170 lb (77.1 kg)  11/24/21 174 lb (78.9 kg)   Lab Results  Component Value Date   CREATININE 1.54 (H) 12/25/2021   CREATININE 1.06 12/24/2021   CREATININE 1.14 12/23/2021   Physical Exam Vitals and nursing note reviewed. Exam conducted with a chaperone present (girlfriend).  Constitutional:      Appearance: Normal appearance.  HENT:     Head: Normocephalic and atraumatic.  Cardiovascular:     Rate and Rhythm: Normal rate and regular rhythm.  Pulmonary:     Effort: Pulmonary effort is normal. No respiratory distress.     Breath sounds: No wheezing or rales.  Abdominal:     General: Abdomen is flat. There is no distension.     Palpations: Abdomen is soft.  Musculoskeletal:        General: No tenderness.     Cervical back: Normal range of motion and neck supple.     Right lower leg: No edema.     Left lower leg: No edema.  Skin:    General: Skin is warm and dry.  Neurological:     General: No focal deficit present.     Mental Status: He is alert and oriented to person, place, and time.  Psychiatric:        Mood and Affect: Mood normal.        Behavior: Behavior normal.        Thought Content: Thought content normal.    Assessment  & Plan:   1: Chronic heart failure with reduced ejection fraction- - NYHA class II - euvolemic today - weighing daily; reminded to call for an overnight weight gain of > 2 pounds or a weekly weight gain of > 5 pounds - not adding salt and is using Mrs 02/20/2022 for seasoning - saw cardiology Sharilyn Sites) 11/24/21 - on GDMT of losartan and metoprolol - discussed other GDMT but he prefers that Dr. 14/9/22 adjust medications so that only 1 provider is doing it - drinking water and cranberry juice during the day - BNP 12/23/21  2:  HTN- - BP mildly elevated (154/85) - sees PCP ([place]) - BMP 12/25/21 reviewed and showed sodium 136, potassium 4.4, creatinine 1.54 and GFR 46  3: DM- - A1c 11/01/21 was 6.8%  4: COPD- - saw pulmonology 11/03/21) 11/21/21 - wearing oxygen at 3-4L - stopped smoking February 2019   Medication bottles reviewed.   Return in 6 weeks sooner if needed

## 2022-01-03 NOTE — Telephone Encounter (Signed)
Patient is aware of date and time of covid test prior to PFT.

## 2022-01-04 ENCOUNTER — Ambulatory Visit: Payer: Medicare Other | Attending: Family | Admitting: Family

## 2022-01-04 ENCOUNTER — Telehealth: Payer: Self-pay

## 2022-01-04 ENCOUNTER — Other Ambulatory Visit: Payer: Self-pay

## 2022-01-04 ENCOUNTER — Encounter: Payer: Self-pay | Admitting: Family

## 2022-01-04 VITALS — BP 154/85 | HR 91 | Resp 20 | Ht 65.0 in | Wt 169.0 lb

## 2022-01-04 DIAGNOSIS — I5022 Chronic systolic (congestive) heart failure: Secondary | ICD-10-CM | POA: Insufficient documentation

## 2022-01-04 DIAGNOSIS — I350 Nonrheumatic aortic (valve) stenosis: Secondary | ICD-10-CM | POA: Insufficient documentation

## 2022-01-04 DIAGNOSIS — F419 Anxiety disorder, unspecified: Secondary | ICD-10-CM | POA: Insufficient documentation

## 2022-01-04 DIAGNOSIS — M549 Dorsalgia, unspecified: Secondary | ICD-10-CM | POA: Diagnosis not present

## 2022-01-04 DIAGNOSIS — E1122 Type 2 diabetes mellitus with diabetic chronic kidney disease: Secondary | ICD-10-CM

## 2022-01-04 DIAGNOSIS — Z87891 Personal history of nicotine dependence: Secondary | ICD-10-CM | POA: Insufficient documentation

## 2022-01-04 DIAGNOSIS — I110 Hypertensive heart disease with heart failure: Secondary | ICD-10-CM | POA: Insufficient documentation

## 2022-01-04 DIAGNOSIS — J449 Chronic obstructive pulmonary disease, unspecified: Secondary | ICD-10-CM

## 2022-01-04 DIAGNOSIS — E119 Type 2 diabetes mellitus without complications: Secondary | ICD-10-CM | POA: Diagnosis not present

## 2022-01-04 DIAGNOSIS — Z9981 Dependence on supplemental oxygen: Secondary | ICD-10-CM | POA: Diagnosis not present

## 2022-01-04 DIAGNOSIS — G8929 Other chronic pain: Secondary | ICD-10-CM | POA: Insufficient documentation

## 2022-01-04 DIAGNOSIS — E785 Hyperlipidemia, unspecified: Secondary | ICD-10-CM | POA: Diagnosis not present

## 2022-01-04 DIAGNOSIS — I2510 Atherosclerotic heart disease of native coronary artery without angina pectoris: Secondary | ICD-10-CM | POA: Insufficient documentation

## 2022-01-04 DIAGNOSIS — I739 Peripheral vascular disease, unspecified: Secondary | ICD-10-CM

## 2022-01-04 DIAGNOSIS — I10 Essential (primary) hypertension: Secondary | ICD-10-CM

## 2022-01-04 DIAGNOSIS — N1831 Chronic kidney disease, stage 3a: Secondary | ICD-10-CM

## 2022-01-04 NOTE — Telephone Encounter (Signed)
DPR von file. Lmom with results. Patient is to contact the office if any questions. Orders placed for 104yr repeat testing.

## 2022-01-04 NOTE — Patient Instructions (Signed)
Continue weighing daily and call for an overnight weight gain of 3 pounds or more or a weekly weight gain of more than 5 pounds.  ° °The [facility] will be moving around the corner to [id number] mid-February. Our phone number will remain the same ° °

## 2022-01-04 NOTE — Telephone Encounter (Signed)
-----   Message from Iran Ouch, MD sent at 01/04/2022  8:03 AM EST ----- Improved ABI compared to before with patent left SFA.  Good report overall.  Repeat studies in 1 year.

## 2022-01-08 ENCOUNTER — Other Ambulatory Visit: Payer: Self-pay

## 2022-01-08 ENCOUNTER — Other Ambulatory Visit
Admission: RE | Admit: 2022-01-08 | Discharge: 2022-01-08 | Disposition: A | Discharge status: Home / Self Care | Payer: Medicare Other | Source: Ambulatory Visit | Attending: Pulmonary Disease | Admitting: Pulmonary Disease

## 2022-01-08 DIAGNOSIS — Z01812 Encounter for preprocedural laboratory examination: Secondary | ICD-10-CM | POA: Diagnosis present

## 2022-01-08 DIAGNOSIS — Z20822 Contact with and (suspected) exposure to covid-19: Secondary | ICD-10-CM | POA: Diagnosis not present

## 2022-01-08 LAB — SARS CORONAVIRUS 2 (TAT 6-24 HRS): SARS Coronavirus 2: NEGATIVE

## 2022-01-09 ENCOUNTER — Ambulatory Visit: Payer: Medicare Other

## 2022-01-10 ENCOUNTER — Ambulatory Visit: Payer: Medicare Other | Attending: Pulmonary Disease

## 2022-01-10 DIAGNOSIS — Z87891 Personal history of nicotine dependence: Secondary | ICD-10-CM | POA: Insufficient documentation

## 2022-01-10 DIAGNOSIS — J449 Chronic obstructive pulmonary disease, unspecified: Secondary | ICD-10-CM | POA: Insufficient documentation

## 2022-01-10 DIAGNOSIS — Z7951 Long term (current) use of inhaled steroids: Secondary | ICD-10-CM | POA: Insufficient documentation

## 2022-01-23 ENCOUNTER — Encounter: Payer: Self-pay | Admitting: Pulmonary Disease

## 2022-01-23 ENCOUNTER — Ambulatory Visit (INDEPENDENT_AMBULATORY_CARE_PROVIDER_SITE_OTHER): Payer: Medicare Other | Admitting: Pulmonary Disease

## 2022-01-23 ENCOUNTER — Other Ambulatory Visit: Payer: Self-pay

## 2022-01-23 VITALS — BP 120/80 | HR 89 | Temp 97.3°F | Ht 65.0 in | Wt 171.8 lb

## 2022-01-23 DIAGNOSIS — N179 Acute kidney failure, unspecified: Secondary | ICD-10-CM | POA: Diagnosis not present

## 2022-01-23 DIAGNOSIS — I5022 Chronic systolic (congestive) heart failure: Secondary | ICD-10-CM

## 2022-01-23 DIAGNOSIS — J449 Chronic obstructive pulmonary disease, unspecified: Secondary | ICD-10-CM

## 2022-01-23 DIAGNOSIS — J9611 Chronic respiratory failure with hypoxia: Secondary | ICD-10-CM | POA: Diagnosis not present

## 2022-01-23 DIAGNOSIS — Z87891 Personal history of nicotine dependence: Secondary | ICD-10-CM

## 2022-01-23 NOTE — Progress Notes (Signed)
Subjective:    Patient ID: Harold Parker, male    DOB: 11-19-1946, 76 y.o.   MRN: [id number] Chief Complaint  Patient presents with   Follow-up    HPI Patient is a 76 year old former smoker with stage III, severe, COPD and chronic hypoxemic respiratory failure secondary to the same on oxygen therapy, who presents for follow-up on the above.  This is a scheduled visit.  Patient was last evaluated here on 21 November 2021 after having had an episode of COVID-19 pneumonia and acute renal failure in November 2022.  The patient had called with issues with breathlessness on 7 January, was noted that he required more oxygen.  He was referred to the ED given his breathlessness noted on the phone.  The patient was admitted [facility] and noted to have decompensation of systolic congestive heart failure.  Patient improved with diuretic therapy.  Since his discharge she has felt markedly better and notes that he is able to perform his activities of daily living as prior to his COVID-19 diagnosis in November.  He has not however, followed up with nephrology as advised after his November admission.  He does have underlying diabetes and likely is developing some renal insufficiency issues.  Per Dr. Assunta Gambles note during his November admission it was the expectation that the patient should have followed up with renal.  Most recent basic metabolic panel obtained on 9 January showed that his GFR was around 47 years/min BUN was 41 and creatinine 1.54.  The patient does not have any respiratory complaints today as noted he has improved dramatically with regards to his dyspnea post-COVID.  He is compliant with his respiratory medications and also with his oxygen supplementation.  He does not endorse any other complaints today.   Review of Systems A 10 point review of systems was performed and it is as noted above otherwise negative.  Patient Active Problem List   Diagnosis Date Noted   Acute on chronic congestive heart  failure ([place])    Pressure injury of skin 0000000   Acute metabolic acidosis 0000000   COVID-19 10/31/2021   Chronic respiratory failure with hypoxia ([place]) 05/02/2021   Insomnia    History of COVID-19 pneumonia 01/11/21 02/04/2021   Pulmonary nodules    Acute respiratory disease due to COVID-19 virus 01/11/2021   HTN (hypertension)    Acute on chronic systolic CHF (congestive heart failure) (HCC)    COPD (chronic obstructive pulmonary disease) (HCC)    GERD (gastroesophageal reflux disease)    Anxiety    AKI (acute kidney injury) ([place])    Hyperkalemia    Elevated troponin    Acute hypoxemic respiratory failure due to COVID-19 [facility])    Melena    Duodenal ulceration    Gastrointestinal hemorrhage    Severe anemia 03/14/2018   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   S/P aortic valve replacement with bioprosthetic valve  02/19/2018   Preoperative respiratory examination 02/13/2018   Acute on chronic respiratory failure with hypoxia ([place]) 02/11/2018   Influenza with respiratory manifestation 02/11/2018   Stage 3 severe COPD by GOLD classification ([place])    Aortic stenosis    Bronchitis, chronic obstructive, with exacerbation (HCC)    Tobacco abuse    CAD (coronary artery disease) [id number]   Acute systolic CHF (congestive heart failure) ([place]) 02/07/2018   Ischemic cardiomyopathy 02/07/2018   Insulin dependent type 2 diabetes mellitus ([place]) 02/07/2018   Hyperlipidemia LDL goal <70 02/07/2018   COPD with acute exacerbation ([place]) 02/05/2018  NSTEMI (non-ST elevated myocardial infarction) ([place]) 02/05/2018   Social History   Tobacco Use   Smoking status: Former    Packs/day: 1.00    Years: 40.00    Pack years: 40.00    Types: Cigarettes    Quit date: 02/02/2018    Years since quitting: 3.9   Smokeless tobacco: Never   Tobacco comments:    Quit 01/2018 - on 05/01/2018 talked to Riyaz about Relaspe concerns. He ststaes that he has no taste for tobacco since he quit in Feb.  Substance Use  Topics   Alcohol use: Never    Comment: occ   Allergies  Allergen Reactions   Atorvastatin Other (See Comments)    Leg aches and weakness    Current Meds  Medication Sig   acetaminophen (TYLENOL) 500 MG tablet Take 1,000 mg by mouth daily as needed for moderate pain or headache.   albuterol (PROVENTIL) (2.5 MG/3ML) 0.083% nebulizer solution Take 3 mLs (2.5 mg total) by nebulization every 6 (six) hours as needed for wheezing or shortness of breath.   ALPRAZolam (XANAX) 0.5 MG tablet Take 0.5 mg by mouth 2 (two) times daily.   amLODipine (NORVASC) 10 MG tablet Take 1 tablet (10 mg total) by mouth daily.   aspirin EC 81 MG tablet Take 81 mg by mouth daily.   budesonide (PULMICORT) 0.5 MG/2ML nebulizer solution Take 2 mLs (0.5 mg total) by nebulization 2 (two) times daily. Use AFTER Perforomist   busPIRone (BUSPAR) 10 MG tablet Take 10 mg by mouth 2 (two) times daily.   esomeprazole (NEXIUM) 40 MG capsule Take 40 mg by mouth at bedtime.    ezetimibe (ZETIA) 10 MG tablet Take 10 mg by mouth at bedtime.    formoterol (PERFOROMIST) 20 MCG/2ML nebulizer solution Take 2 mLs (20 mcg total) by nebulization 2 (two) times daily.   furosemide (LASIX) 40 MG tablet Take 1 tablet (40 mg total) by mouth 2 (two) times daily.   glipiZIDE (GLUCOTROL) 5 MG tablet Take 1 tablet (5 mg total) by mouth 2 (two) times daily before a meal. Take 5 mg in the morning and take a second 5 mg dose at night on Mon, Wed, and Fri (Patient taking differently: Take 5 mg by mouth 2 (two) times daily before a meal.)   guaiFENesin (MUCINEX) 600 MG 12 hr tablet Take 1,200 mg by mouth every 12 (twelve) hours as needed.   losartan (COZAAR) 50 MG tablet Take 1 tablet by mouth daily.   metFORMIN (GLUCOPHAGE) 1000 MG tablet TAKE 1 TABLET BY MOUTH TWICE DAILY WITH A MEAL   metoprolol succinate (TOPROL-XL) 50 MG 24 hr tablet Take 1 tablet (50 mg total) by mouth daily.   Nebulizers (COMPRESSOR/NEBULIZER) MISC Use as directed   PROAIR HFA  108 (90 Base) MCG/ACT inhaler Inhale 1 puff into the lungs every 4 (four) hours as needed for wheezing or shortness of breath.   revefenacin (YUPELRI) 175 MCG/3ML nebulizer solution Take 3 mLs (175 mcg total) by nebulization daily. Can be mixed with Perforomist   rosuvastatin (CRESTOR) 10 MG tablet Take 1 tablet (10 mg total) by mouth daily.   traZODone (DESYREL) 50 MG tablet Take 50 mg by mouth at bedtime as needed.   Vitamin D, Cholecalciferol, 1000 units TABS Take 1,000 Units by mouth every morning.    Immunization History  Administered Date(s) Administered   Influenza Inj Mdck Quad Pf 12/02/2017   Influenza Split 12/11/2014, 11/01/2019   Influenza, High Dose Seasonal PF 09/16/2018, 08/21/2019, 11/01/2020  Influenza-Unspecified 09/18/2021   PFIZER(Purple Top)SARS-COV-2 Vaccination 02/06/2020, 02/29/2020       Objective:   Physical Exam BP 120/80 (BP Location: Left Arm, Patient Position: Sitting, Cuff Size: Normal)    Pulse 89    Temp (!) 97.3 F (36.3 C) (Oral)    Ht 5\' 5"  (1.651 m)    Wt 171 lb 12.8 oz (77.9 kg)    SpO2 93%    BMI 28.59 kg/m  GENERAL: Chronically ill-appearing man, well nourished, well developed. No conversational dyspnea. Comfortable with nasal cannula O2 at 4 L/min, sats 95%.   HEAD: Normocephalic, atraumatic.  EYES: Pupils equal, round, reactive to light.  No scleral icterus.  MOUTH: Oral mucosa moist.  No thrush. NECK: Supple. No thyromegaly. Trachea midline. No JVD.  No adenopathy. PULMONARY: Distant breath sounds.  Coarse breath sounds with no other adventitious sounds. CARDIOVASCULAR: S1 and S2. Regular rate and rhythm.  1/6 systolic murmur at the lower left sternal border. ABDOMEN: Benign. MUSCULOSKELETAL: No joint deformity, no clubbing, no edema.  NEUROLOGIC: No overt focal deficit, speech is fluent. SKIN: Intact,warm,dry.  On limited exam no rashes. PSYCH: Mood and behavior normal.       Assessment & Plan:     ICD-10-CM   1. Stage 3 severe COPD  by GOLD classification ([place])  J44.9    Continue medications as they are Medications: Perforomist/budesonide/Yupelri Continue as needed albuterol    2. Chronic respiratory failure with hypoxia (HCC)  J96.11    Patient compliant with oxygen Continue oxygen supplementation    3. AKI (acute kidney injury) ([place])  N17.9 Ambulatory referral to Nephrology   Noted first in November 2022 Last GFR noted at 47 mL/min Nephrology follow-up was recommended Will refer to nephrology    4. Chronic systolic CHF (congestive heart failure) (HCC)  I50.22    Recent decompensation Required diuretics Appears compensated at present    5. Former heavy tobacco smoker  Z87.891    No evidence of relapse     Orders Placed This Encounter  Procedures   Ambulatory referral to Nephrology    Referral Priority:   Routine    Referral Type:   Consultation    Referral Reason:   Specialty Services Required    Requested Specialty:   Nephrology    Number of Visits Requested:   1   From our standpoint the patient appears well compensated at present.  Recommend follow-up in 2 months time.  We will place referral to nephrology as noted above for his issues with what appears to be developing chronic renal insufficiency.  Renold Don, MD Advanced Bronchoscopy PCCM [facility]    *This note was dictated using voice recognition software/Dragon.  Despite best efforts to proofread, errors can occur which can change the meaning. Any transcriptional errors that result from this process are unintentional and may not be fully corrected at the time of dictation.

## 2022-01-23 NOTE — Patient Instructions (Signed)
We are going to place a referral to the nephrologist (kidney specialist) so that they can continue to monitor your kidneys particularly in view of your diabetes and recent admission with kidney failure.  Continue to stay active this is what is going to help you get over the lung issues that the COVID left behind  We will see him in follow-up in 2 months time call sooner should any new problems arise.

## 2022-01-26 ENCOUNTER — Encounter: Payer: Self-pay | Admitting: Pulmonary Disease

## 2022-02-03 ENCOUNTER — Other Ambulatory Visit: Payer: Self-pay | Admitting: Pulmonary Disease

## 2022-02-07 ENCOUNTER — Other Ambulatory Visit: Payer: Self-pay | Admitting: Pulmonary Disease

## 2022-02-09 ENCOUNTER — Other Ambulatory Visit: Payer: Self-pay | Admitting: Cardiovascular Disease

## 2022-02-16 ENCOUNTER — Ambulatory Visit: Payer: Medicare Other | Admitting: Family

## 2022-02-26 ENCOUNTER — Ambulatory Visit: Payer: Medicare Other | Admitting: Family

## 2022-02-27 ENCOUNTER — Other Ambulatory Visit: Payer: Self-pay

## 2022-02-27 ENCOUNTER — Encounter: Payer: Self-pay | Admitting: Cardiovascular Disease

## 2022-02-27 ENCOUNTER — Ambulatory Visit (INDEPENDENT_AMBULATORY_CARE_PROVIDER_SITE_OTHER): Payer: Medicare Other | Admitting: Cardiovascular Disease

## 2022-02-27 VITALS — BP 124/60 | HR 77 | Ht 65.0 in | Wt 171.0 lb

## 2022-02-27 DIAGNOSIS — I5022 Chronic systolic (congestive) heart failure: Secondary | ICD-10-CM

## 2022-02-27 DIAGNOSIS — I739 Peripheral vascular disease, unspecified: Secondary | ICD-10-CM

## 2022-02-27 DIAGNOSIS — E785 Hyperlipidemia, unspecified: Secondary | ICD-10-CM

## 2022-02-27 DIAGNOSIS — I255 Ischemic cardiomyopathy: Secondary | ICD-10-CM | POA: Diagnosis not present

## 2022-02-27 DIAGNOSIS — I10 Essential (primary) hypertension: Secondary | ICD-10-CM

## 2022-02-27 DIAGNOSIS — I2510 Atherosclerotic heart disease of native coronary artery without angina pectoris: Secondary | ICD-10-CM | POA: Diagnosis not present

## 2022-02-27 MED ORDER — ENTRESTO 49-51 MG PO TABS: 1.0000 | ORAL_TABLET | Freq: Two times a day (BID) | ORAL | 5 refills | Status: DC

## 2022-02-27 MED ORDER — AMLODIPINE BESYLATE 5 MG PO TABS: 5.0000 mg | ORAL_TABLET | Freq: Every day | ORAL | Status: DC

## 2022-02-27 MED ORDER — FUROSEMIDE 40 MG PO TABS: 40.0000 mg | ORAL_TABLET | Freq: Every day | ORAL | 1 refills | Status: DC

## 2022-02-27 NOTE — Patient Instructions (Signed)
Medication Instructions:  ?Your physician has recommended you make the following change in your medication:  ? ?- REDUCE Amlodipine to 5 mg daily ?- REDUCE Furosemide (Lasix) to 40 mg daily ?- STOP Losartan ?- START Entresto 49/51 mg twice a day. An Rx has been sent to your pharmacy ? ? ?*If you need a refill on your cardiac medications before your next appointment, please call your pharmacy* ? ? ?Lab Work: ?Your physician recommends that you return for lab work (Bmp) in:  1 week ? ?If you have labs (blood work) drawn today and your tests are completely normal, you will receive your results only by: ?MyChart Message (if you have MyChart) OR ?A paper copy in the mail ?If you have any lab test that is abnormal or we need to change your treatment, we will call you to review the results. ? ? ?Testing/Procedures: ?None ordered ? ? ?Follow-Up: ?At [facility], you and your health needs are our priority.  As part of our continuing mission to provide you with exceptional heart care, we have created designated Provider Care Teams.  These Care Teams include your primary Cardiologist (physician) and Advanced Practice Providers (APPs -  Physician Assistants and Nurse Practitioners) who all work together to provide you with the care you need, when you need it. ? ?We recommend signing up for the patient portal called "MyChart".  Sign up information is provided on this After Visit Summary.  MyChart is used to connect with patients for Virtual Visits (Telemedicine).  Patients are able to view lab/test results, encounter notes, upcoming appointments, etc.  Non-urgent messages can be sent to your provider as well.   ?To learn more about what you can do with MyChart, go to [email or web address].   ? ?Your next appointment:   ?4 week(s) ? ?The format for your next appointment:   ?In Person ? ?Provider:   ?You may see [name], MD or one of the following Advanced Practice Providers on your designated Care Team:   ?Nicolasa Ducking, NP ?Eula Listen, PA-C ?Cadence Fransico Michael, PA-C ? ? ?Other Instructions ?N/A ? ?

## 2022-02-27 NOTE — Progress Notes (Signed)
?  ?Cardiology Office Note ? ? ?Date:  02/27/2022  ? ?ID:  Harold Parker, DOB [birth year], MRN [id number] ? ?PCP:  Merlene Laughter, MD  ?Cardiologist:   [name], MD ? ?Chief Complaint  ?Patient presents with  ? Other  ?  F/u hospital CHF/COPD/CRF no complaints today. Meds reviewed verbally with pt.  ? ? ? ?  ?History of Present Illness: ?Harold Parker is a 76 y.o. male who presents for a follow-up visit regarding peripheral arterial disease, coronary artery disease and aortic stenosis status post CABG and bioprosthetic aortic valve in March 2019.  Other medical problems include essential hypertension, hyperlipidemia, type 2 diabetes, COPD and previous tobacco use.   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 ?  ?He presented in February of 2019 with non-ST elevation myocardial infarction.  Cardiac catheterization revealed severe left main stenosis in addition to significant disease involving OM1 and RCA.  EF was 40 to 45% with moderate aortic stenosis.  The patient underwent CABG and aortic valve replacement. He had postoperative atrial fibrillation that was controlled with amiodarone.  He was discharged home on aspirin and Plavix.  He presented back with an upper GI bleed with a hemoglobin 4.7.  EGD showed duodenal ulcers.   ?The patient is known to have bilateral leg claudication.  Angiography in March 2020 showed no significant aortoiliac disease.  On the right, there was long heavily calcified occlusion of the SFA with very well-developed collaterals from the profunda and three-vessel runoff below the knee.  On the left, there was heavily calcified disease affecting the left SFA with three-vessel runoff below the knee.  I performed successful orbital atherectomy and drug-coated balloon angioplasty to the left SFA. ? ?He was hospitalized in February of this year with COVID-19 pneumonia.  He was treated with steroids and remdesivir.  His oxygen requirement  increased to 4 L/min.  He was rehospitalized the same month with continued shortness of breath.  He was again treated with steroids.  Oxygen could not be decreased to below 4 L and it was established that this is his new baseline after COVID-19 infection. ?He had an echocardiogram done during his hospitalization which showed an EF of 40 to 45%, moderate pulmonary hypertension with estimated RV systolic pressure of 58 mmHg and mild mitral regurgitation. ? ?He was hospitalized in November with COVID-19 pneumonia complicated by respiratory failure and acute kidney injury with hyperkalemia.  He had repeat echocardiogram done which showed an EF of 35 to 40% with moderately enlarged right ventricle.  Work-up for pulmonary embolism and DVT was negative. ? ?He was hospitalized again in January with acute on chronic respiratory failure due to a combination of COPD and heart failure.  He was diuresed and was given breathing treatments and steroids with subsequent improvement.  The dose of furosemide was increased to 40 mg twice daily.  He had recent labs done with his primary care physician which were reviewed by me.  It showed a creatinine of 1.54 with BUN of 41. ?He feels better overall with less shortness of breath.  He is still on home oxygen.  No chest pain.  No lower extremity claudication. ? ? ?Past Medical History:  ?Diagnosis Date  ? Bilateral carotid bruits   ? a. 01/2018 U/S: < 50% bilat ICA stenosis.  ? CAD (coronary artery disease)   ? a. 1998 s/p MI and BMS [place], [place]); b. 1999 redo PCI/rotablator in setting of what sounds like  ISR;  c. Multiple stress tests over the years - last ~ 2017, reportedly nl; d. 01/2018 NSTEMI/Cath: LM 50m/d, LAD 50p, 40p/m, D1 60ost, OM1 95, RCA 100ost/p w/ L->R collats, EF 45%; e. s/p 3V CABG 02/19/18 (LIMA-LAD, VG-D1, VG-OM)  ? CHF (congestive heart failure) (HCC)   ? Chronic lower back pain   ? COPD (chronic obstructive pulmonary disease) (HCC)   ? GIB (gastrointestinal bleeding)    ? a. 02/2018 GIB and anemia w/ Hgb of 4.7 on presentation; b. 03/2018 EGD: 2 nonbleeding duodenal ulcers.  ? HTN (hypertension)   ? Hypercholesteremia   ? Ischemic cardiomyopathy   ? a. 01/2018 Echo: EF 40-45%, mid-apicalanteroseptal, ant, apical sev HK, mod apicalinferior HK. Gr2 DD. Mod AS, mild MR, mod dil LA, PASP .  ? Moderate aortic stenosis   ? a. 01/2018 Echo: Mod AS, mean grad (S) , Valve area (VTI) 1.06 cm^2, (Vmax) 1.27 cm^2; b. 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  ? Myocardial infarction [facility]) ~ 1998/1999  ? S/P aortic valve replacement with bioprosthetic valve 02/19/2018  ? a. 02/19/2018 AVR: 25 mm Edwards Inspiris Resilia stented bovine pericardial tissue valve  ? 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  ? LIMA to LAD, SVG to D1, SVG to OM, EVH via right thigh and leg  ? Tobacco abuse   ? Type II diabetes mellitus (HCC)   ? ? ?Past Surgical History:  ?Procedure Laterality Date  ? ABDOMINAL AORTOGRAM N/A 03/04/2019  ? Procedure: ABDOMINAL AORTOGRAM;  Surgeon: Iran Ouch, MD;  Location: [facility] INVASIVE CV LAB;  Service: Cardiovascular;  Laterality: N/A;  ? AORTIC VALVE REPLACEMENT N/A 02/19/2018  ? Procedure: AORTIC VALVE REPLACEMENT (AVR);  Surgeon: Purcell Nails, MD;  Location: [facility] OR;  Service: Open Heart Surgery;  Laterality: N/A;  ? COLONOSCOPY    ? CORONARY ANGIOPLASTY WITH STENT PLACEMENT  ~ 1998/1999  ? CORONARY ARTERY BYPASS GRAFT N/A 02/19/2018  ? Procedure: CORONARY ARTERY BYPASS GRAFTING (CABG) x three , using left internal mammary artery and right leg greater saphenous vein harvested endoscopically;  Surgeon: Purcell Nails, MD;  Location: [facility] OR;  Service: Open Heart Surgery;  Laterality: N/A;  ? ESOPHAGOGASTRODUODENOSCOPY (EGD) WITH PROPOFOL N/A 03/18/2018  ? Procedure: ESOPHAGOGASTRODUODENOSCOPY (EGD) WITH PROPOFOL;  Surgeon: Midge Minium, MD;  Location: [facility] ENDOSCOPY;  Service: Endoscopy;  Laterality: N/A;  ? LEFT HEART CATH AND CORONARY ANGIOGRAPHY N/A 02/06/2018  ? Procedure: LEFT HEART CATH AND  CORONARY ANGIOGRAPHY;  Surgeon: Iran Ouch, MD;  Location: [facility] INVASIVE CV LAB;  Service: Cardiovascular;  Laterality: N/A;  ? LOWER EXTREMITY ANGIOGRAPHY Bilateral 03/04/2019  ? Procedure: Lower Extremity Angiography;  Surgeon: Iran Ouch, MD;  Location: [facility] INVASIVE CV LAB;  Service: Cardiovascular;  Laterality: Bilateral;  ? PERIPHERAL VASCULAR ATHERECTOMY Left 03/04/2019  ? Procedure: PERIPHERAL VASCULAR ATHERECTOMY;  Surgeon: Iran Ouch, MD;  Location: [facility] INVASIVE CV LAB;  Service: Cardiovascular;  Laterality: Left;  SFA  ? TEE WITHOUT CARDIOVERSION N/A 02/19/2018  ? Procedure: TRANSESOPHAGEAL ECHOCARDIOGRAM (TEE);  Surgeon: Purcell Nails, MD;  Location: [facility] OR;  Service: Open Heart Surgery;  Laterality: N/A;  ? TONSILLECTOMY    ? ? ? ?Current Outpatient Medications  ?Medication Sig Dispense Refill  ? acetaminophen (TYLENOL) 500 MG tablet Take 1,000 mg by mouth daily as needed for moderate pain or headache.    ? albuterol (PROVENTIL) (2.5 MG/3ML) 0.083% nebulizer solution Take 3 mLs (2.5 mg total) by nebulization every 6 (six) hours as needed for wheezing or shortness of breath. 75  mL 12  ? ALPRAZolam (XANAX) 0.5 MG tablet Take 0.5 mg by mouth 2 (two) times daily.    ? amLODipine (NORVASC) 10 MG tablet Take 1 tablet (10 mg total) by mouth daily. 30 tablet 1  ? aspirin EC 81 MG tablet Take 81 mg by mouth daily.    ? budesonide (PULMICORT) 0.5 MG/2ML nebulizer solution TAKE BY NEBULIZATION TWICE DAILY USE AFTER PERFOROMIST 120 mL 11  ? busPIRone (BUSPAR) 10 MG tablet Take 10 mg by mouth 2 (two) times daily.    ? cholecalciferol (VITAMIN D3) 25 MCG (1000 UNIT) tablet Take 4,000 Units by mouth daily.    ? esomeprazole (NEXIUM) 40 MG capsule Take 40 mg by mouth at bedtime.     ? ezetimibe (ZETIA) 10 MG tablet Take 10 mg by mouth at bedtime.     ? formoterol (PERFOROMIST) 20 MCG/2ML nebulizer solution TAKE 2 MLS BY MOUTH BY NEBULIZATION TWICE DAILY 120 mL 11  ? furosemide (LASIX) 40 MG tablet  Take 1 tablet (40 mg total) by mouth 2 (two) times daily. 180 tablet 1  ? glipiZIDE (GLUCOTROL) 5 MG tablet Take 1 tablet (5 mg total) by mouth 2 (two) times daily before a meal. Take 5 mg in the morning and ta

## 2022-02-28 ENCOUNTER — Ambulatory Visit: Payer: Medicare Other | Admitting: Family

## 2022-03-01 ENCOUNTER — Other Ambulatory Visit: Payer: Self-pay | Admitting: Pulmonary Disease

## 2022-03-01 ENCOUNTER — Other Ambulatory Visit: Payer: Self-pay | Admitting: Cardiovascular Disease

## 2022-03-06 ENCOUNTER — Other Ambulatory Visit: Payer: Self-pay

## 2022-03-06 ENCOUNTER — Telehealth: Payer: Self-pay | Admitting: Pulmonary Disease

## 2022-03-06 ENCOUNTER — Other Ambulatory Visit (INDEPENDENT_AMBULATORY_CARE_PROVIDER_SITE_OTHER): Payer: Medicare Other

## 2022-03-06 DIAGNOSIS — I5022 Chronic systolic (congestive) heart failure: Secondary | ICD-10-CM

## 2022-03-06 MED ORDER — YUPELRI 175 MCG/3ML IN SOLN: 175.0000 ug | Freq: Every day | RESPIRATORY_TRACT | 11 refills | Status: AC

## 2022-03-06 NOTE — Telephone Encounter (Signed)
Spoke to patient, who stated that [facility] does not have Yupelri in stock. He is going to call around to local pharmacies to see if they have it in stock. He will call  back with update.  ?

## 2022-03-06 NOTE — Telephone Encounter (Signed)
Yupelri  sent in to pt's preferred pharmacy. Nothing further needed. ?

## 2022-03-14 LAB — BASIC METABOLIC PANEL
BUN/Creatinine Ratio: 20 (ref 10–24)
BUN: 20 mg/dL (ref 8–27)
CO2: 22 mmol/L (ref 20–29)
Calcium: 9.7 mg/dL (ref 8.6–10.2)
Creatinine, Ser: 0.98 mg/dL (ref 0.76–1.27)
Glucose: 105 mg/dL — ABNORMAL HIGH (ref 70–99)
Potassium: 4.1 mmol/L (ref 3.5–5.2)
eGFR: 79 mL/min/{1.73_m2} (ref >59)

## 2022-03-26 ENCOUNTER — Telehealth: Payer: Self-pay | Admitting: Cardiovascular Disease

## 2022-03-26 NOTE — Telephone Encounter (Signed)
Pt c/o swelling: STAT is pt has developed SOB within 24 hours ? ?How much weight have you gained and in what time span? no ? ?If swelling, where is the swelling located? Ankles and feet  ? ?Are you currently taking a fluid pill? yes ? ?Are you currently SOB? no ? ?Do you have a log of your daily weights (if so, list)?   ? ?Have you gained 3 pounds in a day or 5 pounds in a week?  no ? ?Have you traveled recently? no ? ?

## 2022-03-26 NOTE — Telephone Encounter (Signed)
Spoke to patient he stated for the past 3 to 4 days he has swelling in both feet and ankles.Weight stable.Sob no worse.He is on a low salt diet.He has been keeping legs elevated when he is sitting.Stated at his last visit Dr.Arida decreased Lasix to 40 mg daily instead of twice a day.He wants to know if he should start taking Lasix 40 mg twice a day.Advised I will send message to [place] for advice. ?

## 2022-03-27 MED ORDER — FUROSEMIDE 40 MG PO TABS: ORAL_TABLET | ORAL | 1 refills | Status: DC

## 2022-03-27 NOTE — Telephone Encounter (Signed)
?  Patient calling back to follow up on call from 03/26/22 ?

## 2022-03-27 NOTE — Telephone Encounter (Signed)
Spoke with the patient. ?Advised the patient that Dr. Fletcher Anon is out of the office until next week. ?I have fwd the msg to [employer], PA who is rounding in the hospital and I am awaiting his response. ? ?Pt did provide an update. His weight and sob are stable. [place]. He noted that his ankle and feet were swollen. He has tried to stay of his feet and he is elevating them as much as possible. Swelling has improved just mildly. ? ?Adv the patient that he could take an additional 40 mg of lasix today. We will then await Ryan's response for further recommendation. ? ?Patient agreeable with the poc and voiced appreciation for the call back. ? ? ?

## 2022-03-27 NOTE — Telephone Encounter (Signed)
Patient calling back to follow up.

## 2022-03-27 NOTE — Telephone Encounter (Signed)
Agree with leg elevation.  He should also add compression stockings if he has not already done so.  He can increase his Lasix to 40 mg twice daily alternating with 40 mg daily every other day until he is seen in the office next week.  At that time, we will look to trend his blood work and likely discontinue amlodipine, which may be contributing, with further escalation of GDMT. ?

## 2022-03-27 NOTE — Telephone Encounter (Signed)
Patient made aware of Harold Listen, PA response and recommendation. ?Patient verbalized understanding and is agreeable with the plan. ?

## 2022-03-27 NOTE — Telephone Encounter (Signed)
Dr. Kirke Corin is out of the office. Patient has an upcoming appt with Eula Listen, PA. Will route to [place] to assist. ?

## 2022-04-01 NOTE — Progress Notes (Signed)
? ?Cardiology Office Note   ? ?Date:  04/02/2022  ? ?ID:  Harold Parker, DOB 05/09/1946, MRN [id number] ? ?PCP:  Merlene Laughter, MD  ?Cardiologist:  [name], MD  ?Electrophysiologist:  None  ? ?Chief Complaint: Follow up ? ?History of Present Illness:  ? ?Harold Parker is a 76 y.o. male with history of CAD status post CABG and bioprosthetic aortic valve replacement in 02/2018, HFrEF, pulmonary hypertension, PAD, DM2, HTN, HLD, GI bleed, and chronic hypoxic respiratory failure on supplemental oxygen, COVID, COPD with prior tobacco use who presents for follow-up of his CAD and cardiomyopathy. ? ?He was admitted in 01/2018 with an NSTEMI.  LHC revealed severe left main stenosis in addition to significant disease involving the RCA and OM1.  EF was 40 to 45% with moderate aortic stenosis.  He subsequently underwent CABG and aortic valve replacement.  Postoperative course was notable for A-fib but that was controlled with amiodarone.  He was readmitted with an upper GI bleed with a hemoglobin of 4.7 with EGD demonstrating duodenal ulcers.   ? ?With regards to his PAD, lower extremity angiography in 02/2019, in the context of bilateral lower extremity claudication, showed no significant aortoiliac disease.  On the right, there was a long heavily calcified occlusion of the SFA with well-developed collaterals from the profunda and three-vessel runoff below the knee.  On the left, there was heavily calcified disease affecting the left SFA with three-vessel runoff below the knee.  He underwent successful orbital atherectomy and drug-coated balloon angioplasty to the left SFA.  Most recent noninvasive lower extremity imaging showed an improved ABI on the left with a patent left SFA.   ? ?He was admitted in 01/2021 with COVID-pneumonia and treated with steroids and remdesivir.  His oxygen requirement increased to 4 L.  He was readmitted later that same month with continued shortness of breath and again required treatment with  steroids.  His oxygen requirement could not be decreased below 4 L and this was established as his new baseline following COVID infection.  Echo performed during admission showed an EF of 40 to 45%, moderate pulmonary hypertension with a PASP of 58 mmHg, and mild mitral regurgitation.  He was admitted in 10/2021 with recurrent COVID-pneumonia complicated by acute on chronic respiratory failure and AKI with hyperkalemia.  Repeat echo showed an EF of 35 to 40% with moderately enlarged right ventricle and a normal functioning bioprosthetic aortic valve.  Work-up for DVT and PE was negative.  He was admitted in 12/2021 with acute on chronic hypoxic respiratory failure secondary to COPD and heart failure.  He was diuresed and given breathing treatments with improvement in symptoms.  He was last seen in the office in 02/2022 and felt less short of breath and continued to require supplemental oxygen.  He was without symptoms of angina, decompensation, or claudication.  By our scale, his weight was down 3 pounds when compared to his visit in 11/2021.  Losartan was transitioned to [place].  Based on labs at that time, there was concern for volume depletion with his furosemide being decreased back to 40 mg daily. ? ?He contacted our office earlier this noting some increased lower extremity swelling with stable shortness of breath and weight.  Lasix was titrated to 40 mg twice daily alternating with 40 mg daily every other day.  Leg elevation and compression stockings were encouraged. ? ?He comes in accompanied by his fianc?e today and is doing well from a cardiac perspective.  Since increasing furosemide as  outlined above, and with the addition of compression stockings, he has had improvement in his lower extremity swelling.  He does continue to have some mild lower extremity swelling along the feet and ankles, otherwise the swelling has resolved.  He is tolerating this titrated dose of furosemide without issues.  His lower  extremity swelling may have been in the setting of saltier foods consumed during the Easter holiday.  He does notice some positional dizziness if he stands quickly or bends over for an extended timeframe.  No symptoms concerning for angina or decompensation.  He is watching his sodium and potassium intake.  Drinking approximately 2 L of fluid daily.  His weight is down 2 pounds by our scale today when compared to his visit last month.  He remains on supplemental oxygen at 3 to 4 L.  He was evaluated by pulmonology earlier this morning for routine follow-up and was doing well from their perspective. ? ? ?Labs independently reviewed: ?02/2022 - BUN 20, SCr 0.98, potassium 4.1 ?12/2021 - Hgb 16.0, PLT 196 ?10/2021 - magnesium 1.8, A1c 6.8, albumin 3.9, AST normal, ALT 58 ?01/2021 - TSH normal ?12/2020 - TC 90, TG 87, HDL 28, LDL 45 ? ?Past Medical History:  ?Diagnosis Date  ? Bilateral carotid bruits   ? a. 01/2018 U/S: < 50% bilat ICA stenosis.  ? CAD (coronary artery disease)   ? a. 1998 s/p MI and BMS [facility], [place]); b. 1999 redo PCI/rotablator in setting of what sounds like ISR;  c. Multiple stress tests over the years - last ~ 2017, reportedly nl; d. 01/2018 NSTEMI/Cath: LM 67m/d, LAD 50p, 40p/m, D1 60ost, OM1 95, RCA 100ost/p w/ L->R collats, EF 45%; e. s/p 3V CABG 02/19/18 (LIMA-LAD, VG-D1, VG-OM)  ? CHF (congestive heart failure) (HCC)   ? Chronic lower back pain   ? COPD (chronic obstructive pulmonary disease) (HCC)   ? GIB (gastrointestinal bleeding)   ? a. 02/2018 GIB and anemia w/ Hgb of 4.7 on presentation; b. 03/2018 EGD: 2 nonbleeding duodenal ulcers.  ? HTN (hypertension)   ? Hypercholesteremia   ? Ischemic cardiomyopathy   ? a. 01/2018 Echo: EF 40-45%, mid-apicalanteroseptal, ant, apical sev HK, mod apicalinferior HK. Gr2 DD. Mod AS, mild MR, mod dil LA, PASP .  ? Moderate aortic stenosis   ? a. 01/2018 Echo: Mod AS, mean grad (S) , Valve area (VTI) 1.06 cm^2, (Vmax) 1.27 cm^2; b. s/p bioprosthetic  AVR 02/19/18.  ? Myocardial infarction [facility]) ~ 1998/1999  ? S/P aortic valve replacement with bioprosthetic valve 02/19/2018  ? a. 02/19/2018 AVR: 25 mm Edwards Inspiris Resilia stented bovine pericardial tissue valve  ? 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  ? LIMA to LAD, SVG to D1, SVG to OM, EVH via right thigh and leg  ? Tobacco abuse   ? Type II diabetes mellitus (HCC)   ? ? ?Past Surgical History:  ?Procedure Laterality Date  ? ABDOMINAL AORTOGRAM N/A 03/04/2019  ? Procedure: ABDOMINAL AORTOGRAM;  Surgeon: Iran Ouch, MD;  Location: [facility] INVASIVE CV LAB;  Service: Cardiovascular;  Laterality: N/A;  ? AORTIC VALVE REPLACEMENT N/A 02/19/2018  ? Procedure: AORTIC VALVE REPLACEMENT (AVR);  Surgeon: Purcell Nails, MD;  Location: [facility] OR;  Service: Open Heart Surgery;  Laterality: N/A;  ? COLONOSCOPY    ? CORONARY ANGIOPLASTY WITH STENT PLACEMENT  ~ 1998/1999  ? CORONARY ARTERY BYPASS GRAFT N/A 02/19/2018  ? Procedure: CORONARY ARTERY BYPASS GRAFTING (CABG) x three , using left internal mammary artery and right  leg greater saphenous vein harvested endoscopically;  Surgeon: Purcell Nails, MD;  Location: [facility] OR;  Service: Open Heart Surgery;  Laterality: N/A;  ? ESOPHAGOGASTRODUODENOSCOPY (EGD) WITH PROPOFOL N/A 03/18/2018  ? Procedure: ESOPHAGOGASTRODUODENOSCOPY (EGD) WITH PROPOFOL;  Surgeon: Midge Minium, MD;  Location: [facility] ENDOSCOPY;  Service: Endoscopy;  Laterality: N/A;  ? LEFT HEART CATH AND CORONARY ANGIOGRAPHY N/A 02/06/2018  ? Procedure: LEFT HEART CATH AND CORONARY ANGIOGRAPHY;  Surgeon: Iran Ouch, MD;  Location: [facility] INVASIVE CV LAB;  Service: Cardiovascular;  Laterality: N/A;  ? LOWER EXTREMITY ANGIOGRAPHY Bilateral 03/04/2019  ? Procedure: Lower Extremity Angiography;  Surgeon: Iran Ouch, MD;  Location: [facility] INVASIVE CV LAB;  Service: Cardiovascular;  Laterality: Bilateral;  ? PERIPHERAL VASCULAR ATHERECTOMY Left 03/04/2019  ? Procedure: PERIPHERAL VASCULAR ATHERECTOMY;  Surgeon: Iran Ouch, MD;   Location: [facility] INVASIVE CV LAB;  Service: Cardiovascular;  Laterality: Left;  SFA  ? TEE WITHOUT CARDIOVERSION N/A 02/19/2018  ? Procedure: TRANSESOPHAGEAL ECHOCARDIOGRAM (TEE);  Surgeon: Purcell Nails, MD;  Marton Redwood

## 2022-04-02 ENCOUNTER — Encounter: Payer: Self-pay | Admitting: Physician Assistant

## 2022-04-02 ENCOUNTER — Ambulatory Visit (INDEPENDENT_AMBULATORY_CARE_PROVIDER_SITE_OTHER): Payer: Medicare Other | Admitting: Physician Assistant

## 2022-04-02 ENCOUNTER — Ambulatory Visit (INDEPENDENT_AMBULATORY_CARE_PROVIDER_SITE_OTHER): Payer: Medicare Other | Admitting: Pulmonary Disease

## 2022-04-02 ENCOUNTER — Encounter: Payer: Self-pay | Admitting: Pulmonary Disease

## 2022-04-02 VITALS — BP 126/62 | HR 83 | Temp 98.3°F | Ht 65.0 in | Wt 169.4 lb

## 2022-04-02 VITALS — BP 128/66 | HR 68 | Ht 65.0 in | Wt 169.0 lb

## 2022-04-02 DIAGNOSIS — J9611 Chronic respiratory failure with hypoxia: Secondary | ICD-10-CM | POA: Diagnosis not present

## 2022-04-02 DIAGNOSIS — E785 Hyperlipidemia, unspecified: Secondary | ICD-10-CM

## 2022-04-02 DIAGNOSIS — I739 Peripheral vascular disease, unspecified: Secondary | ICD-10-CM

## 2022-04-02 DIAGNOSIS — Z953 Presence of xenogenic heart valve: Secondary | ICD-10-CM

## 2022-04-02 DIAGNOSIS — I255 Ischemic cardiomyopathy: Secondary | ICD-10-CM | POA: Diagnosis not present

## 2022-04-02 DIAGNOSIS — I10 Essential (primary) hypertension: Secondary | ICD-10-CM

## 2022-04-02 DIAGNOSIS — I5022 Chronic systolic (congestive) heart failure: Secondary | ICD-10-CM

## 2022-04-02 DIAGNOSIS — Z87891 Personal history of nicotine dependence: Secondary | ICD-10-CM

## 2022-04-02 DIAGNOSIS — I2510 Atherosclerotic heart disease of native coronary artery without angina pectoris: Secondary | ICD-10-CM | POA: Diagnosis not present

## 2022-04-02 DIAGNOSIS — J449 Chronic obstructive pulmonary disease, unspecified: Secondary | ICD-10-CM

## 2022-04-02 DIAGNOSIS — Z951 Presence of aortocoronary bypass graft: Secondary | ICD-10-CM | POA: Diagnosis not present

## 2022-04-02 MED ORDER — SACUBITRIL-VALSARTAN 97-103 MG PO TABS: 1.0000 | ORAL_TABLET | Freq: Two times a day (BID) | ORAL | 11 refills | Status: DC

## 2022-04-02 NOTE — Patient Instructions (Addendum)
Continue your breathing medications as they are. ? ?We will see you in follow-up in 3 months time call sooner should any new problems arise. ?

## 2022-04-02 NOTE — Patient Instructions (Signed)
Medication Instructions:  ?Your physician has recommended you make the following change in your medication:  ? ?STOP Amlodipine ?INCREASE Entresto 97-103 mg twice a day ? ?*If you need a refill on your cardiac medications before your next appointment, please call your pharmacy* ? ? ?Lab Work: ?BMET today ? ?BMET in 2 weeks over at the [facility]. Just check in at registration.  ?  ?If you have labs (blood work) drawn today and your tests are completely normal, you will receive your results only by: ?MyChart Message (if you have MyChart) OR ?A paper copy in the mail ?If you have any lab test that is abnormal or we need to change your treatment, we will call you to review the results. ? ? ?Testing/Procedures: ?None ? ? ?Follow-Up: ?At [facility], you and your health needs are our priority.  As part of our continuing mission to provide you with exceptional heart care, we have created designated Provider Care Teams.  These Care Teams include your primary Cardiologist (physician) and Advanced Practice Providers (APPs -  Physician Assistants and Nurse Practitioners) who all work together to provide you with the care you need, when you need it. ?  ? ?Your next appointment:   ?6 week(s) ? ?The format for your next appointment:   ?In Person ? ?Provider:   ?Kathlyn [place], MD or Christell Faith, PA-C ? ? ? ? ?Important Information About Sugar ? ? ? ? ?  ?

## 2022-04-02 NOTE — Progress Notes (Signed)
Subjective:    Patient ID: Harold Parker, male    DOB: 07/03/1946, 76 y.o.   MRN: [id number] Patient Care Team: Merlene Laughter, MD as PCP - General (Internal Medicine) Iran Ouch, MD as PCP - Cardiology (Cardiology)  Chief Complaint  Patient presents with   Follow-up    SOB with exertion.    HPI Health is a 76 year old former smoker with stage III, severe, COPD and chronic hypoxemic respiratory failure secondary to the same on oxygen therapy, who presents for follow-up on the above.  This is a scheduled visit.  Patient was last evaluated here on 23 January 2022 and at that time was referred to nephrology as he had had acute kidney injury during hospitalization for COVID-19 in November 2022.  During that hospitalization the patient was noted to have decompensation of systolic heart failure.  He was placed on diuretics and since his discharge she has felt markedly better and notes that he is able to perform his activities of daily living as prior to his COVID-19 diagnosis in November. The patient does not have any respiratory complaints today as noted he has improved dramatically with regards to his dyspnea post-COVID.  He is compliant with his respiratory medications and also with his oxygen supplementation.  He does not endorse any other complaints today.  He feels fairly well today.  He has noted some increase in lower extremity edema after his Lasix dose was decreased by cardiology and Entresto added.  Overall however he feels that with regards to his pulmonary function he is at baseline.  He does not endorse any other symptomatology today.  He is compliant with oxygen at 3 L/min.   Review of Systems A 10 point review of systems was performed and it is as noted above otherwise negative.  Patient Active Problem List   Diagnosis Date Noted   Acute on chronic congestive heart failure (HCC)    Pressure injury of skin 11/01/2021   Acute metabolic acidosis 11/01/2021   COVID-19  10/31/2021   Chronic respiratory failure with hypoxia (HCC) 05/02/2021   Insomnia    History of COVID-19 pneumonia 01/11/21 02/04/2021   Pulmonary nodules    Acute respiratory disease due to COVID-19 virus 01/11/2021   HTN (hypertension)    Acute on chronic systolic CHF (congestive heart failure) (HCC)    COPD (chronic obstructive pulmonary disease) (HCC)    GERD (gastroesophageal reflux disease)    Anxiety    AKI (acute kidney injury) (HCC)    Hyperkalemia    Elevated troponin    Acute hypoxemic respiratory failure due to COVID-19 [facility])    Melena    Duodenal ulceration    Gastrointestinal hemorrhage    Severe anemia 03/14/2018   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   S/P aortic valve replacement with bioprosthetic valve  02/19/2018   Preoperative respiratory examination 02/13/2018   Acute on chronic respiratory failure with hypoxia (HCC) 02/11/2018   Influenza with respiratory manifestation 02/11/2018   Stage 3 severe COPD by GOLD classification (HCC)    Aortic stenosis    Bronchitis, chronic obstructive, with exacerbation (HCC)    Tobacco abuse    CAD (coronary artery disease) 02/07/2018   Acute systolic CHF (congestive heart failure) (HCC) 02/07/2018   Ischemic cardiomyopathy 02/07/2018   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 He ststaes that he has no taste for tobacco since he quit in Feb.  Substance Use Topics   Alcohol use: Never    Comment: occ   Allergies  Allergen Reactions   Atorvastatin Other  (See Comments)    Leg aches and weakness    Current Meds  Medication Sig   acetaminophen (TYLENOL) 500 MG tablet Take 1,000 mg by mouth daily as needed for moderate pain or headache.   albuterol (PROVENTIL) (2.5 MG/3ML) 0.083% nebulizer solution Take 3 mLs (2.5 mg total) by nebulization every 6 (six) hours as needed for wheezing or shortness of breath.   ALPRAZolam (XANAX) 0.5 MG tablet Take 0.5 mg by mouth 2 (two) times daily.   amLODipine (NORVASC) 5 MG tablet Take 1 tablet (5 mg total) by mouth daily.   aspirin EC 81 MG tablet Take 81 mg by mouth daily.   budesonide (PULMICORT) 0.5 MG/2ML nebulizer solution TAKE BY NEBULIZATION TWICE DAILY USE AFTER PERFOROMIST   busPIRone (BUSPAR) 10 MG tablet Take 10 mg by mouth 2 (two) times daily.   cholecalciferol (VITAMIN D3) 25 MCG (1000 UNIT) tablet Take 4,000 Units by mouth daily.   esomeprazole (NEXIUM) 40 MG capsule Take 40 mg by mouth at bedtime.    ezetimibe (ZETIA) 10 MG tablet Take 10 mg by mouth at bedtime.    formoterol (PERFOROMIST) 20 MCG/2ML nebulizer solution TAKE 2 MLS BY MOUTH BY NEBULIZATION TWICE DAILY   furosemide (LASIX) 40 MG tablet Take 40 mg daily and Alternating 40 mg twice a day.   glipiZIDE (GLUCOTROL) 5 MG tablet Take 1 tablet (5 mg total) by mouth 2 (two) times daily before a meal. Take 5 mg in the morning and take a second 5 mg dose at night on Mon, Wed, and Fri (Patient taking differently: Take 5 mg by mouth 2 (two) times daily before a meal.)   guaiFENesin (MUCINEX) 600 MG 12 hr tablet Take 1,200 mg by mouth every 12 (twelve) hours as needed.   metFORMIN (GLUCOPHAGE) 1000 MG tablet TAKE 1 TABLET BY MOUTH TWICE DAILY WITH A MEAL   metoprolol succinate (TOPROL-XL) 50 MG 24 hr tablet TAKE 1 TABLET BY MOUTH EVERY DAY   Nebulizers (COMPRESSOR/NEBULIZER) MISC Use as directed   PROAIR HFA 108 (90 Base) MCG/ACT inhaler Inhale 1 puff into the lungs every 4 (four) hours as needed for wheezing or shortness of breath.    revefenacin (YUPELRI) 175 MCG/3ML nebulizer solution Take 3 mLs (175 mcg total) by nebulization daily.   rosuvastatin (CRESTOR) 10 MG tablet TAKE 1 TABLET BY MOUTH EVERY DAY   sacubitril-valsartan (ENTRESTO) 49-51 MG Take 1 tablet by mouth 2 (two) times daily.   traZODone (DESYREL) 50 MG tablet Take 50 mg by mouth at bedtime as needed.   Immunization History  Administered Date(s) Administered   Influenza Inj Mdck Quad Pf 12/02/2017   Influenza Split 12/11/2014, 11/01/2019   Influenza, High Dose Seasonal PF 09/16/2018, 08/21/2019, 11/01/2020   Influenza-Unspecified 09/18/2021   PFIZER(Purple Top)SARS-COV-2 Vaccination 02/06/2020, 02/29/2020       Objective:   Physical Exam BP 126/62 (BP Location: Left Arm)   Pulse 83  Temp 98.3 F (36.8 C) (Temporal)   Ht 5\' 5"  (1.651 m)   Wt 169 lb 6.4 oz (76.8 kg)   SpO2 93%   BMI 28.19 kg/m   SpO2: 93 % O2 Device: Nasal cannula O2 Flow Rate (L/min): 3 L/min O2 Type: Continuous O2  GENERAL: Chronically ill-appearing man, well nourished, well developed. No conversational dyspnea. Comfortable with nasal cannula O2 at 4 L/min, sats 95%.   HEAD: Normocephalic, atraumatic.  EYES: Pupils equal, round, reactive to light.  No scleral icterus.  MOUTH: Oral mucosa moist.  No thrush. NECK: Supple. No thyromegaly. Trachea midline. No JVD.  No adenopathy. PULMONARY: Good air entry bilaterally.  No adventitious sounds. CARDIOVASCULAR: S1 and S2. Regular rate and rhythm.  1/6 systolic murmur at the lower left sternal border. ABDOMEN: Benign. MUSCULOSKELETAL: No joint deformity, no clubbing, mild LE edema.  NEUROLOGIC: No overt focal deficit, speech is fluent. SKIN: Intact,warm,dry.  On limited exam no rashes. PSYCH: Mood and behavior normal.      Assessment & Plan:     ICD-10-CM   1. Stage 3 severe COPD by GOLD classification (HCC)  J44.9    Continue nebulization treatment with Perforomist, budesonide and Yupelri Continue as needed albuterol     2. Chronic respiratory failure with hypoxia (HCC)  J96.11    Continue oxygen supplementation at 3 L/min 4/7 Patient is compliant    3. Chronic systolic CHF (congestive heart failure) (HCC)  I50.22    This issue adds complexity to his management Follows with cardiology    4. Former heavy tobacco smoker  Z87.891    No evidence of relapse     Patient will continue current regimen.  We will see him in follow-up in 3 months time he is to contact us prior to that time should any new difficulties arise.  Gailen Shelter, MD Advanced Bronchoscopy P[facility]    *This note was dictated using voice recognition software/Dragon.  Despite best efforts to proofread, errors can occur which can change the meaning. Any transcriptional errors that result from this process are unintentional and may not be fully corrected at the time of dictation.

## 2022-04-03 LAB — BASIC METABOLIC PANEL
BUN/Creatinine Ratio: 22 (ref 10–24)
BUN: 20 mg/dL (ref 8–27)
CO2: 23 mmol/L (ref 20–29)
Calcium: 9.4 mg/dL (ref 8.6–10.2)
Chloride: 105 mmol/L (ref 96–106)
Creatinine, Ser: 0.91 mg/dL (ref 0.76–1.27)
Glucose: 94 mg/dL (ref 70–99)
Potassium: 4 mmol/L (ref 3.5–5.2)
Sodium: 143 mmol/L (ref 134–144)
eGFR: 87 mL/min/{1.73_m2} (ref >59)

## 2022-04-10 ENCOUNTER — Telehealth: Payer: Self-pay | Admitting: Physician Assistant

## 2022-04-10 DIAGNOSIS — I2510 Atherosclerotic heart disease of native coronary artery without angina pectoris: Secondary | ICD-10-CM

## 2022-04-10 DIAGNOSIS — I255 Ischemic cardiomyopathy: Secondary | ICD-10-CM

## 2022-04-10 DIAGNOSIS — N179 Acute kidney failure, unspecified: Secondary | ICD-10-CM

## 2022-04-10 DIAGNOSIS — I5022 Chronic systolic (congestive) heart failure: Secondary | ICD-10-CM

## 2022-04-10 NOTE — Telephone Encounter (Signed)
? ?  Pt c/o medication issue: ? ?1. Name of Medication: sacubitril-valsartan (ENTRESTO) 97-103 MG ? ?2. How are you currently taking this medication (dosage and times per day)? Take 1 tablet by mouth 2 (two) times daily. ? ?3. Are you having a reaction (difficulty breathing--STAT)?  ? ?4. What is your medication issue? Pt said, since Ryan changed his meds he's been feeling dizzy and headache. He wanted to know if he needs to continue taking his new dose  ?

## 2022-04-10 NOTE — Telephone Encounter (Signed)
Spoke with patient and reviewed his symptoms. He reports lightheadedness with dizziness when he changes positions such as bending over and standing up. He did mention he thought the weather changes were a culprit as well. Instructed him to monitor blood pressures and to let us know if his symptoms persist. He was agreeable with plan and had no further questions at this time.  ?

## 2022-04-11 NOTE — Telephone Encounter (Signed)
Please get specific details regarding blood pressure and heart rates so we can determine if we need to decrease his Entresto. ?

## 2022-04-12 NOTE — Telephone Encounter (Signed)
Spoke with patient and to inquire if he had any blood pressure readings available from the last couple of days.  ? ?Readings as follows: ?117/65 day before yesterday ?132/73 Yesterday at 1 pm ?145/79 Yesterday morning ? ?Patient states his symptoms have resolved and reports it was sinus congestion. Instructed him to keep monitoring his blood pressures and to please call us back if his symptoms return. He was appreciative for the call back with no further questions or concerns at this time.  ?

## 2022-04-16 ENCOUNTER — Other Ambulatory Visit
Admission: RE | Admit: 2022-04-16 | Discharge: 2022-04-16 | Disposition: A | Discharge status: Home / Self Care | Payer: Medicare Other | Source: Ambulatory Visit | Attending: Physician Assistant | Admitting: Physician Assistant

## 2022-04-16 DIAGNOSIS — I739 Peripheral vascular disease, unspecified: Secondary | ICD-10-CM | POA: Diagnosis present

## 2022-04-16 DIAGNOSIS — J9611 Chronic respiratory failure with hypoxia: Secondary | ICD-10-CM | POA: Diagnosis present

## 2022-04-16 DIAGNOSIS — E785 Hyperlipidemia, unspecified: Secondary | ICD-10-CM | POA: Insufficient documentation

## 2022-04-16 DIAGNOSIS — I5022 Chronic systolic (congestive) heart failure: Secondary | ICD-10-CM | POA: Insufficient documentation

## 2022-04-16 DIAGNOSIS — I10 Essential (primary) hypertension: Secondary | ICD-10-CM | POA: Diagnosis present

## 2022-04-16 DIAGNOSIS — I2510 Atherosclerotic heart disease of native coronary artery without angina pectoris: Secondary | ICD-10-CM | POA: Insufficient documentation

## 2022-04-16 DIAGNOSIS — Z951 Presence of aortocoronary bypass graft: Secondary | ICD-10-CM | POA: Diagnosis present

## 2022-04-16 DIAGNOSIS — I255 Ischemic cardiomyopathy: Secondary | ICD-10-CM | POA: Insufficient documentation

## 2022-04-16 DIAGNOSIS — Z953 Presence of xenogenic heart valve: Secondary | ICD-10-CM | POA: Diagnosis present

## 2022-04-16 LAB — BASIC METABOLIC PANEL
Anion gap: 6 (ref 5–15)
BUN: 21 mg/dL (ref 8–23)
CO2: 29 mmol/L (ref 22–32)
Calcium: 9 mg/dL (ref 8.9–10.3)
Chloride: 104 mmol/L (ref 98–111)
Creatinine, Ser: 0.92 mg/dL (ref 0.61–1.24)
GFR, Estimated: >60 mL/min (ref >60)
Glucose, Bld: 101 mg/dL — ABNORMAL HIGH (ref 70–99)
Potassium: 3.4 mmol/L — ABNORMAL LOW (ref 3.5–5.1)
Sodium: 139 mmol/L (ref 135–145)

## 2022-04-16 MED ORDER — POTASSIUM CHLORIDE ER 10 MEQ PO TBCR: 10.0000 meq | EXTENDED_RELEASE_TABLET | Freq: Every day | ORAL | 3 refills | Status: DC

## 2022-04-16 NOTE — Telephone Encounter (Signed)
Reviewed results and recommendations with patient. He verbalized understanding with no further questions at this time.  

## 2022-04-16 NOTE — Telephone Encounter (Signed)
-----   Message from Sondra Barges, PA-C sent at 04/16/2022 12:22 PM EDT ----- ?Renal function stable on current dose of Lasix ?Potassium mildly low ? ?Recommendations: ?-Add KCl 10 mEq daily in an effort to increase potassium slightly, given prior AKI we will not use a higher dose of potassium ?-Repeat BMP 1 week after starting KCl ?

## 2022-04-24 ENCOUNTER — Other Ambulatory Visit
Admission: RE | Admit: 2022-04-24 | Discharge: 2022-04-24 | Disposition: A | Discharge status: Home / Self Care | Payer: Medicare Other | Attending: Physician Assistant | Admitting: Physician Assistant

## 2022-04-24 DIAGNOSIS — I255 Ischemic cardiomyopathy: Secondary | ICD-10-CM | POA: Diagnosis present

## 2022-04-24 DIAGNOSIS — I2510 Atherosclerotic heart disease of native coronary artery without angina pectoris: Secondary | ICD-10-CM | POA: Diagnosis present

## 2022-04-24 DIAGNOSIS — I5022 Chronic systolic (congestive) heart failure: Secondary | ICD-10-CM | POA: Diagnosis present

## 2022-04-24 DIAGNOSIS — N179 Acute kidney failure, unspecified: Secondary | ICD-10-CM | POA: Insufficient documentation

## 2022-04-24 LAB — BASIC METABOLIC PANEL
Anion gap: 5 (ref 5–15)
BUN: 28 mg/dL — ABNORMAL HIGH (ref 8–23)
CO2: 31 mmol/L (ref 22–32)
Calcium: 9.3 mg/dL (ref 8.9–10.3)
Chloride: 104 mmol/L (ref 98–111)
Creatinine, Ser: 1.06 mg/dL (ref 0.61–1.24)
GFR, Estimated: >60 mL/min (ref >60)
Glucose, Bld: 113 mg/dL — ABNORMAL HIGH (ref 70–99)
Potassium: 4.1 mmol/L (ref 3.5–5.1)
Sodium: 140 mmol/L (ref 135–145)

## 2022-05-16 NOTE — Progress Notes (Unsigned)
Cardiology Office Note    Date:  05/17/2022   ID:  Drazen Eckert, DOB [birth year], MRN [id number]  PCP:  Lajean Manes, MD  Cardiologist:  Kathlyn Sacramento, MD  Electrophysiologist:  None   Chief Complaint: Follow-up  History of Present Illness:   Harold Parker is a 75 y.o. male with history of CAD status post CABG and bioprosthetic aortic valve replacement in 02/2018, HFrEF, pulmonary hypertension, PAD, DM2, HTN, HLD, GI bleed, and chronic hypoxic respiratory failure on supplemental oxygen, COVID, COPD with prior tobacco use who presents for follow-up of his CAD and cardiomyopathy.   He was admitted in 01/2018 with an NSTEMI.  LHC revealed severe left main stenosis in addition to significant disease involving the RCA and OM1.  EF was 40 to 45% with moderate aortic stenosis.  He subsequently underwent CABG and aortic valve replacement.  Postoperative course was notable for A-fib but that was controlled with amiodarone.  He was readmitted with an upper GI bleed with a hemoglobin of 4.7 with EGD demonstrating duodenal ulcers.     With regards to his PAD, lower extremity angiography in 02/2019, in the context of bilateral lower extremity claudication, showed no significant aortoiliac disease.  On the right, there was a long heavily calcified occlusion of the SFA with well-developed collaterals from the profunda and three-vessel runoff below the knee.  On the left, there was heavily calcified disease affecting the left SFA with three-vessel runoff below the knee.  He underwent successful orbital atherectomy and drug-coated balloon angioplasty to the left SFA.  Most recent noninvasive lower extremity imaging showed an improved ABI on the left with a patent left SFA.     He was admitted in 01/2021 with COVID-pneumonia and treated with steroids and remdesivir.  His oxygen requirement increased to 4 L.  He was readmitted later that same month with continued shortness of breath and again required treatment with  steroids.  His oxygen requirement could not be decreased below 4 L and this was established as his new baseline following COVID infection.  Echo performed during admission showed an EF of 40 to 45%, moderate pulmonary hypertension with a PASP of 58 mmHg, and mild mitral regurgitation.  He was admitted in 10/2021 with recurrent COVID-pneumonia complicated by acute on chronic respiratory failure and AKI with hyperkalemia.  Repeat echo showed an EF of 35 to 40% with moderately enlarged right ventricle and a normal functioning bioprosthetic aortic valve.  Work-up for DVT and PE was negative.  He was admitted in 12/2021 with acute on chronic hypoxic respiratory failure secondary to COPD and heart failure.  He was diuresed and given breathing treatments with improvement in symptoms.  He was seen in the office in 02/2022 and felt less short of breath and continued to require supplemental oxygen.  He was without symptoms of angina, decompensation, or claudication.  By our scale, his weight was down 3 pounds when compared to his visit in 11/2021.  Losartan was transitioned to [place].  Based on labs at that time, there was concern for volume depletion with his furosemide being decreased back to 40 mg daily.  He was last seen in the office on 04/02/2022 after having had his Lasix titrated to 40 mg twice daily alternating with 40 mg daily due to lower extremity swelling and noted an improvement in symptoms with this, along with the addition of compression stockings.  Swelling was felt to possibly be in the setting of saltier foods consumed over the Easter holiday.  His  weight was down 2 pounds by our scale when compared to his prior clinic visit.  In an effort to optimize GDMT, amlodipine was discontinued and Entresto was titrated to 97/103 mg twice daily.   He comes in accompanied by his fiance today and is doing reasonably well from a cardiac perspective.  At baseline, he notes some chronic mild musculoskeletal anterior  chest wall pain.  However, over the past couple of weeks he has noted an exacerbation of this pain.  Pain is fully reproducible to palpation and response to NSAIDs.  He was also recently prescribed some prednisone for a sinus infection which also helped his chest discomfort.  Pain does not feel similar to his prior angina and is not exertional.  No recent fevers or chills.  He has not noted any wounds, erythema, or rashes.  His weight is down 4 pounds today when compared to his last clinic visit.  He notes vigorous urine output with current Lasix regimen.  No significant orthopnea or lower extremity swelling.   Labs independently reviewed: 04/2022 - potassium 4.1, BUN 28, serum creatinine 1.06 12/2021 - Hgb 16.0, PLT 196 10/2021 - magnesium 1.8, A1c 6.8, albumin 3.9, AST normal, ALT 58 01/2021 - TSH normal 12/2020 - TC 90, TG 87, HDL 28, LDL 45  Past Medical History:  Diagnosis Date   Bilateral carotid bruits    a. 01/2018 U/S: < 50% bilat ICA stenosis.   CAD (coronary artery disease)    a. 1998 s/p MI and BMS [place], [place]); b. 1999 redo PCI/rotablator in setting of what sounds like ISR;  c. Multiple stress tests over the years - last ~ 2017, reportedly nl; d. 01/2018 NSTEMI/Cath: LM 40m/d, LAD 50p, 40p/m, D1 60ost, OM1 95, RCA 100ost/p w/ L->R collats, EF 45%; e. s/p 3V CABG 02/19/18 (LIMA-LAD, VG-D1, VG-OM)   CHF (congestive heart failure) (HCC)    Chronic lower back pain    COPD (chronic obstructive pulmonary disease) ([place])    GIB (gastrointestinal bleeding)    a. 02/2018 GIB and anemia w/ Hgb of 4.7 on presentation; b. 03/2018 EGD: 2 nonbleeding duodenal ulcers.   HTN (hypertension)    Hypercholesteremia    Ischemic cardiomyopathy    a. 01/2018 Echo: EF 40-45%, mid-apicalanteroseptal, ant, apical sev HK, mod apicalinferior HK. Gr2 DD. Mod AS, mild MR, mod dil LA, PASP 76mmHg.   Moderate aortic stenosis    a. 01/2018 Echo: Mod AS, mean grad (S) 37mmHg, Valve area (VTI) 1.06 cm^2, (Vmax) 1.27  cm^2; b. 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   Myocardial infarction [facility]) ~ 1998/1999   S/P aortic valve replacement with bioprosthetic valve 02/19/2018   a. 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   LIMA to LAD, SVG to D1, SVG to OM, EVH via right thigh and leg   Tobacco abuse    Type II diabetes mellitus ([place])     Past Surgical History:  Procedure Laterality Date   ABDOMINAL AORTOGRAM N/A 03/04/2019   Procedure: ABDOMINAL AORTOGRAM;  Surgeon: Wellington Hampshire, MD;  Location: [place] [facility];  Service: Cardiovascular;  Laterality: N/A;   AORTIC VALVE REPLACEMENT N/A 02/19/2018   Procedure: AORTIC VALVE REPLACEMENT (AVR);  Surgeon: Rexene Alberts, MD;  Location: [place];  Service: Open Heart Surgery;  Laterality: N/A;   COLONOSCOPY     CORONARY ANGIOPLASTY WITH STENT PLACEMENT  ~ 1998/1999   CORONARY ARTERY BYPASS GRAFT N/A 02/19/2018   Procedure:  CORONARY ARTERY BYPASS GRAFTING (CABG) x three , using left internal mammary artery and right leg greater saphenous vein harvested endoscopically;  Surgeon: Purcell Nails, MD;  Location: [facility] OR;  Service: Open Heart Surgery;  Laterality: N/A;   ESOPHAGOGASTRODUODENOSCOPY (EGD) WITH PROPOFOL N/A 03/18/2018   Procedure: ESOPHAGOGASTRODUODENOSCOPY (EGD) WITH PROPOFOL;  Surgeon: Midge Minium, MD;  Location: [facility] ENDOSCOPY;  Service: Endoscopy;  Laterality: N/A;   LEFT HEART CATH AND CORONARY ANGIOGRAPHY N/A 02/06/2018   Procedure: LEFT HEART CATH AND CORONARY ANGIOGRAPHY;  Surgeon: Iran Ouch, MD;  Location: [facility] INVASIVE CV LAB;  Service: Cardiovascular;  Laterality: N/A;   LOWER EXTREMITY ANGIOGRAPHY Bilateral 03/04/2019   Procedure: Lower Extremity Angiography;  Surgeon: Iran Ouch, MD;  Location: [facility] INVASIVE CV LAB;  Service: Cardiovascular;  Laterality: Bilateral;   PERIPHERAL VASCULAR ATHERECTOMY Left 03/04/2019   Procedure: PERIPHERAL VASCULAR ATHERECTOMY;  Surgeon:  Iran Ouch, MD;  Location: [facility] INVASIVE CV LAB;  Service: Cardiovascular;  Laterality: Left;  SFA   TEE WITHOUT CARDIOVERSION N/A 02/19/2018   Procedure: TRANSESOPHAGEAL ECHOCARDIOGRAM (TEE);  Surgeon: Purcell Nails, MD;  Location: [facility] OR;  Service: Open Heart Surgery;  Laterality: N/A;   TONSILLECTOMY      Current Medications: Current Meds  Medication Sig   acetaminophen (TYLENOL) 500 MG tablet Take 1,000 mg by mouth daily as needed for moderate pain or headache.   albuterol (PROVENTIL) (2.5 MG/3ML) 0.083% nebulizer solution Take 3 mLs (2.5 mg total) by nebulization every 6 (six) hours as needed for wheezing or shortness of breath.   ALPRAZolam (XANAX) 0.5 MG tablet Take 0.5 mg by mouth 2 (two) times daily.   aspirin EC 81 MG tablet Take 81 mg by mouth daily.   budesonide (PULMICORT) 0.5 MG/2ML nebulizer solution TAKE BY NEBULIZATION TWICE DAILY USE AFTER PERFOROMIST   busPIRone (BUSPAR) 10 MG tablet Take 10 mg by mouth 2 (two) times daily.   cholecalciferol (VITAMIN D3) 25 MCG (1000 UNIT) tablet Take 4,000 Units by mouth daily.   dapagliflozin propanediol (FARXIGA) 10 MG TABS tablet Take 1 tablet (10 mg total) by mouth daily before breakfast.   esomeprazole (NEXIUM) 40 MG capsule Take 40 mg by mouth at bedtime.    ezetimibe (ZETIA) 10 MG tablet Take 10 mg by mouth at bedtime.    formoterol (PERFOROMIST) 20 MCG/2ML nebulizer solution TAKE 2 MLS BY MOUTH BY NEBULIZATION TWICE DAILY   glipiZIDE (GLUCOTROL) 5 MG tablet Take 1 tablet (5 mg total) by mouth 2 (two) times daily before a meal. Take 5 mg in the morning and take a second 5 mg dose at night on Mon, Wed, and Fri (Patient taking differently: Take 5 mg by mouth 2 (two) times daily before a meal.)   guaiFENesin (MUCINEX) 600 MG 12 hr tablet Take 1,200 mg by mouth every 12 (twelve) hours as needed.   metFORMIN (GLUCOPHAGE) 1000 MG tablet TAKE 1 TABLET BY MOUTH TWICE DAILY WITH A MEAL   metoprolol succinate (TOPROL-XL) 50 MG 24 hr  tablet TAKE 1 TABLET BY MOUTH EVERY DAY   Nebulizers (COMPRESSOR/NEBULIZER) MISC Use as directed   potassium chloride (KLOR-CON) 10 MEQ tablet Take 1 tablet (10 mEq total) by mouth daily.   PROAIR HFA 108 (90 Base) MCG/ACT inhaler Inhale 1 puff into the lungs every 4 (four) hours as needed for wheezing or shortness of breath.   revefenacin (YUPELRI) 175 MCG/3ML nebulizer solution Take 3 mLs (175 mcg total) by nebulization daily.   rosuvastatin (CRESTOR) 10 MG tablet TAKE  1 TABLET BY MOUTH EVERY DAY   sacubitril-valsartan (ENTRESTO) 97-103 MG Take 1 tablet by mouth 2 (two) times daily.   traZODone (DESYREL) 50 MG tablet Take 50 mg by mouth at bedtime as needed.   [DISCONTINUED] furosemide (LASIX) 40 MG tablet Take 40 mg daily and Alternating 40 mg twice a day.    Allergies:   Atorvastatin   Social History   Socioeconomic History   Marital status: Widowed    Spouse name: Not on file   Number of children: Not on file   Years of education: Not on file   Highest education level: Not on file  Occupational History   Not on file  Tobacco Use   Smoking status: Former    Packs/day: 1.00    Years: 40.00    Pack years: 40.00    Types: Cigarettes    Quit date: 02/02/2018    Years since quitting: 4.2   Smokeless tobacco: Never   Tobacco comments:    Quit 01/2018 - on 05/01/2018 talked to Avrey about Relaspe concerns. He ststaes that he has no taste for tobacco since he quit in Feb.  Vaping Use   Vaping Use: Never used  Substance and Sexual Activity   Alcohol use: Never    Comment: occ   Drug use: No   Sexual activity: Yes  Other Topics Concern   Not on file  Social History Narrative   Lives in [place]) by himself.  Retired Engineer, structural currently working as Patent examiner.  Fairly active but doesn't routinely exercise.   Social Determinants of Health   Financial Resource Strain: Not on file  Food Insecurity: Not on file  Transportation Needs: Not on file  Physical  Activity: Not on file  Stress: Not on file  Social Connections: Not on file     Family History:  The patient's family history includes Lymphoma in his mother; Peripheral vascular disease in his father.  ROS:   12-point review of systems is negative unless otherwise noted in the HPI.   EKGs/Labs/Other Studies Reviewed:    Studies reviewed were summarized above. The additional studies were reviewed today:  ABIs 01/02/2022: ABI/TBIToday's ABIToday's TBIPrevious ABIPrevious TBI  +-------+-----------+-----------+------------+------------+  Right  0.82       0.34       0.73        0.36          +-------+-----------+-----------+------------+------------+  Left   0.84       0.38       0.74        not detected  +-------+-----------+-----------+------------+------------+  Bilateral ABIs appear increased compared to prior study on 09/20/20. Left  TBIs appear increased compared to prior study on 09/20/20.     Summary:  Right: Resting right ankle-brachial index indicates mild right lower  extremity arterial disease. The right toe-brachial index is abnormal.   Left: Resting left ankle-brachial index indicates mild left lower  extremity arterial disease. The left toe-brachial index is abnormal.     Suggest follow up study in 12 months.  __________   Arterial ultrasound 01/02/2022: Summary:  Left: 50-74% stenosis noted in the superficial femoral artery. Mild  progression is noted compared to previous study.   Suggest follow up study in 12 months.  __________   2D echo 11/01/2021: 1. Left ventricular ejection fraction, by estimation, is 35 to 40%. The  left ventricle has moderately decreased function. Left ventricular  endocardial border not optimally defined to evaluate regional wall motion.  There is mild left ventricular  hypertrophy. Left ventricular diastolic parameters are consistent with  Grade I diastolic dysfunction (impaired relaxation).   2. Right  ventricular systolic function was not well visualized. The right  ventricular size is moderately enlarged.   3. The mitral valve is normal in structure. No evidence of mitral valve  regurgitation.   4. The aortic valve was not well visualized. Aortic valve regurgitation  is not visualized. __________   2d echo 02/05/2021: 1. Left ventricular ejection fraction, by estimation, is 40 to 45%. The  left ventricle has mildly decreased function. Left ventricular endocardial  border not optimally defined to evaluate regional wall motion. Left  ventricular diastolic parameters are  consistent with Grade I diastolic dysfunction (impaired relaxation).   2. Right ventricular systolic function is normal. The right ventricular  size is normal. There is moderately elevated pulmonary artery systolic  pressure. The estimated right ventricular systolic pressure is [id number] mmHg.   3. Left atrial size was mildly dilated.   4. The mitral valve is normal in structure. Mild mitral valve  regurgitation. No evidence of mitral stenosis.   5. Tricuspid valve regurgitation is moderate.   6. The aortic valve was not well visualized. Aortic valve regurgitation  is not visualized. No aortic stenosis is present.   7. The inferior vena cava is normal in size with greater than 50%  respiratory variability, suggesting right atrial pressure of 3 mmHg. __________   2D echo 09/20/2020: 1. Left ventricular ejection fraction, by estimation, is 45 to 50%. The  left ventricle has mildly decreased function. The left ventricle  demonstrates regional wall motion abnormalities (see scoring  diagram/findings for description). Left ventricular  diastolic parameters are consistent with Grade I diastolic dysfunction  (impaired relaxation). There is dyskinesis of the left ventricular, apical  segment.   2. Right ventricular systolic function is moderately reduced. The right  ventricular size is normal.   3. Left atrial size was mildly  dilated.   4. The mitral valve is normal in structure. Mild mitral valve  regurgitation.   5. The aortic valve was not well visualized. Aortic valve regurgitation  is not visualized. No aortic stenosis is present. There is a 25 mm Edwards  valve present in the aortic position. Procedure Date: 02/19/18. Aortic valve  mean gradient measures 9.0  mmHg.   6. Pulmonic valve regurgitation not well assessed.   7. The inferior vena cava is normal in size with greater than 50%  respiratory variability, suggesting right atrial pressure of 3 mmHg. __________   See Epic for remaining studies.   EKG:  EKG is ordered today.  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, no significant change when compared to prior tracing  Recent Labs: 10/31/2021: ALT 58 11/06/2021: Magnesium 1.8 12/23/2021: B Natriuretic Peptide 798.2 12/24/2021: Hemoglobin 16.0; Platelets 196 04/24/2022: BUN 28; Creatinine, Ser 1.06; Potassium 4.1; Sodium 140  Recent Lipid Panel    Component Value Date/Time   CHOL 90 01/12/2021 0445   CHOL 102 03/27/2018 0900   TRIG 87 01/12/2021 0445   HDL 28 (L) 01/12/2021 0445   HDL 35 (L) 03/27/2018 0900   CHOLHDL 3.2 01/12/2021 0445   VLDL 17 01/12/2021 0445   LDLCALC 45 01/12/2021 0445   LDLCALC 50 03/27/2018 0900    PHYSICAL EXAM:    VS:  BP 104/64 (BP Location: Left Arm, Patient Position: Sitting, Cuff Size: Normal)   Pulse 83   Ht  5\' 5"  (1.651 m)   Wt 165 lb (74.8 kg)   BMI 27.46 kg/m   BMI: Body mass index is 27.46 kg/m.  Physical Exam Constitutional:      Appearance: He is well-developed.  HENT:     Head: Normocephalic and atraumatic.  Eyes:     General:        Right eye: No discharge.        Left eye: No discharge.  Neck:     Vascular: No JVD.  Cardiovascular:     Rate and Rhythm: Normal rate and regular rhythm.     Pulses:          Dorsalis pedis pulses are 2+ on the right side and 2+ on the  left side.       Posterior tibial pulses are 2+ on the right side and 2+ on the left side.     Heart sounds: Normal heart sounds, S1 normal and S2 normal. Heart sounds not distant. No midsystolic click and no opening snap. No murmur heard.   No friction rub.     Comments: No wounds along the bilateral feet.  Very light palpation to the anterior chest wall fully reproduces his chest pain.  No evidence of wounds, erythema, or rash along the anterior or lateral chest walls. Pulmonary:     Effort: Pulmonary effort is normal. No respiratory distress.     Breath sounds: Normal breath sounds. No decreased breath sounds, wheezing or rales.  Chest:     Chest wall: No tenderness.  Abdominal:     General: There is no distension.     Palpations: Abdomen is soft.     Tenderness: There is no abdominal tenderness.  Musculoskeletal:     Cervical back: Normal range of motion.     Right lower leg: No edema.     Left lower leg: No edema.  Skin:    General: Skin is warm and dry.     Nails: There is no clubbing.  Neurological:     Mental Status: He is alert and oriented to person, place, and time.  Psychiatric:        Speech: Speech normal.        Behavior: Behavior normal.        Thought Content: Thought content normal.        Judgment: Judgment normal.    Wt Readings from Last 3 Encounters:  05/17/22 165 lb (74.8 kg)  04/02/22 169 lb (76.7 kg)  04/02/22 169 lb 6.4 oz (76.8 kg)     ASSESSMENT & PLAN:   HFrEF secondary to ICM: He appears euvolemic and well compensated with NYHA class III symptoms, though I suspect some of this is related to his underlying chronic hypoxic respiratory failure requiring supplemental oxygen.  In an effort to maximize GDMT, we will add Farxiga 10 mg daily (he will start this when he returns back to [place] from his trip to [place]) along with decreasing furosemide to 40 mg daily.  He will follow-up a BMP 1 week after initiating SGLT2 inhibitor to ensure no  significant AKI.  He will otherwise continue Toprol-XL and Entresto.  No plans to add spironolactone given prior tendency for AKI with hyperkalemia.  We will plan to update a limited echo in several months time on maximally tolerated GDMT to reevaluate his cardiomyopathy.  CHF education.  CAD status post CABG without angina: He is doing well without symptoms concerning for angina.  He does have history of chronic  musculoskeletal chest discomfort following his bypass that has recently been exacerbated over the past couple of weeks.  He is very tender to light palpation on exam today.  No evidence of wounds, trauma, or rash.  Pain is somewhat positional, therefore we will obtain an echo to evaluate for any evidence of pericardial effusion.  Otherwise, continue aggressive risk factor modification and current medical therapy including aspirin, ezetimibe, rosuvastatin, and metoprolol.  Aortic valve disease status post bioprosthetic AVR: He remains on aspirin.  Most recent echo showed normal functioning bioprosthetic aortic valve.  SBE prophylaxis is recommended.  PAD: No symptoms of lifestyle limiting claudication.  Most recent noninvasive lower extremity imaging showed improved ABI on the left with patent left SFA stent.  HTN: Blood pressure is well controlled in the office today.  Continue current medical therapy as outlined above.  HLD: LDL 45 with triglyceride of 87.  He remains on rosuvastatin and ezetimibe.  Chronic hypoxic respiratory failure: Stable.  Followed by pulmonary.    Disposition: F/u with Dr. Fletcher Anon or an APP in 6-8 weeks.   Medication Adjustments/Labs and Tests Ordered: Current medicines are reviewed at length with the patient today.  Concerns regarding medicines are outlined above. Medication changes, Labs and Tests ordered today are summarized above and listed in the Patient Instructions accessible in Encounters.   Signed, Christell Faith, PA-C 05/17/2022 12:59 PM     [address], [place] [address] [phone]

## 2022-05-17 ENCOUNTER — Ambulatory Visit (INDEPENDENT_AMBULATORY_CARE_PROVIDER_SITE_OTHER): Payer: Medicare Other | Admitting: Physician Assistant

## 2022-05-17 ENCOUNTER — Encounter: Payer: Self-pay | Admitting: Physician Assistant

## 2022-05-17 VITALS — BP 104/64 | HR 83 | Ht 65.0 in | Wt 165.0 lb

## 2022-05-17 DIAGNOSIS — Z953 Presence of xenogenic heart valve: Secondary | ICD-10-CM

## 2022-05-17 DIAGNOSIS — I5022 Chronic systolic (congestive) heart failure: Secondary | ICD-10-CM | POA: Diagnosis not present

## 2022-05-17 DIAGNOSIS — I10 Essential (primary) hypertension: Secondary | ICD-10-CM

## 2022-05-17 DIAGNOSIS — I255 Ischemic cardiomyopathy: Secondary | ICD-10-CM | POA: Diagnosis not present

## 2022-05-17 DIAGNOSIS — Z951 Presence of aortocoronary bypass graft: Secondary | ICD-10-CM

## 2022-05-17 DIAGNOSIS — J9611 Chronic respiratory failure with hypoxia: Secondary | ICD-10-CM

## 2022-05-17 DIAGNOSIS — E785 Hyperlipidemia, unspecified: Secondary | ICD-10-CM

## 2022-05-17 DIAGNOSIS — I2510 Atherosclerotic heart disease of native coronary artery without angina pectoris: Secondary | ICD-10-CM

## 2022-05-17 DIAGNOSIS — I739 Peripheral vascular disease, unspecified: Secondary | ICD-10-CM

## 2022-05-17 MED ORDER — DAPAGLIFLOZIN PROPANEDIOL 10 MG PO TABS: 10.0000 mg | ORAL_TABLET | Freq: Every day | ORAL | 11 refills | Status: DC

## 2022-05-17 MED ORDER — FUROSEMIDE 40 MG PO TABS: 40.0000 mg | ORAL_TABLET | Freq: Every day | ORAL | 1 refills | Status: DC

## 2022-05-17 NOTE — Patient Instructions (Signed)
Medication Instructions:  Your physician has recommended you make the following change in your medication:   START Farxiga 10 mg once daily When you start that please decrease Furosemide to 40 mg once daily   *If you need a refill on your cardiac medications before your next appointment, please call your pharmacy*   Lab Work: BMET one week after you start medication. No appointment is needed for this just go to [facility] Entrance at [facility] then go to 1st desk on the right to check in (REGISTRATION)  Lab hours: Monday- Friday (7:30 am- 5:30 pm)  If you have labs (blood work) drawn today and your tests are completely normal, you will receive your results only by: MyChart Message (if you have MyChart) OR A paper copy in the mail If you have any lab test that is abnormal or we need to change your treatment, we will call you to review the results.   Testing/Procedures: Your physician has requested that you have an echocardiogram. Echocardiography is a painless test that uses sound waves to create images of your heart. It provides your doctor with information about the size and shape of your heart and how well your heart's chambers and valves are working. This procedure takes approximately one hour. There are no restrictions for this procedure.    Follow-Up: At [facility], you and your health needs are our priority.  As part of our continuing mission to provide you with exceptional heart care, we have created designated Provider Care Teams.  These Care Teams include your primary Cardiologist (physician) and Advanced Practice Providers (APPs -  Physician Assistants and Nurse Practitioners) who all work together to provide you with the care you need, when you need it.   Your next appointment:   6-8 week(s)  The format for your next appointment:   In Person  Provider:   [name], MD or Eula Listen, PA-C       Important Information About Sugar

## 2022-06-05 ENCOUNTER — Telehealth: Payer: Self-pay | Admitting: Physician Assistant

## 2022-06-05 MED ORDER — PREDNISONE 10 MG PO TABS
ORAL_TABLET | ORAL | 0 refills | Status: AC
Start: 2022-06-05 — End: 2022-06-10

## 2022-06-05 NOTE — Telephone Encounter (Signed)
  Pt c/o medication issue:  1. Name of Medication:   dapagliflozin propanediol (FARXIGA) 10 MG TABS tablet    2. How are you currently taking this medication (dosage and times per day)? Take 1 tablet (10 mg total) by mouth daily before breakfast.  3. Are you having a reaction (difficulty breathing--STAT)? no  4. What is your medication issue? Pt said, since he started taking this med he's not feeling well. He cant get out of his house. Also he said if Alycia Rossetti can prescribe him prednisone since the weather been affecting his breathing

## 2022-06-05 NOTE — Telephone Encounter (Signed)
Reviewed recommendations with patient to stop farxiga and start prednisone as directed. He verbalized understanding and was very appreciative for the prompt reply and instructions. He had no further questions at this time and confirmed upcoming appointments.

## 2022-06-05 NOTE — Telephone Encounter (Signed)
Left voicemail message to call back for review of recommendations.  

## 2022-06-05 NOTE — Telephone Encounter (Signed)
Patient had called back to follow up on call. Spoke with him and reviewed his concerns. He wanted to know if he should be peeing so much because he has to go frequently and it is limiting him getting out of the house. We discussed what the medication is for and what we are treating. Inquired about his weights and he simply stated that they were good. He also is having pain at that chest site and would like to see if he could get some prednisone to help with all of his discomfort. Advised that I would need to send this message over to provider for his review and then I will return his call with recommendations. He does have appointment later this week for ultrasound of his heart and is hoping that will give [place] additional information. He was agreeable with plan and will await my call back.

## 2022-06-05 NOTE — Telephone Encounter (Signed)
There is associated increase in urine output on Farxiga.  Okay to discontinue medication secondary to intolerance.  I would like for him to come by for previously recommended BMP to ensure his renal function is stable however.  We can base dosing of his furosemide on those labs.  Okay to send in prednisone taper 50 mg daily, 40 mg daily, 30 mg daily, 20 mg daily, and 10 mg daily.

## 2022-06-05 NOTE — Telephone Encounter (Signed)
Patient was coming back for an update. Please advise

## 2022-06-08 ENCOUNTER — Ambulatory Visit (INDEPENDENT_AMBULATORY_CARE_PROVIDER_SITE_OTHER): Payer: Medicare Other

## 2022-06-08 ENCOUNTER — Telehealth: Payer: Self-pay | Admitting: *Deleted

## 2022-06-08 ENCOUNTER — Encounter: Payer: Self-pay | Admitting: Physician Assistant

## 2022-06-08 DIAGNOSIS — I255 Ischemic cardiomyopathy: Secondary | ICD-10-CM | POA: Diagnosis not present

## 2022-06-08 DIAGNOSIS — N179 Acute kidney failure, unspecified: Secondary | ICD-10-CM

## 2022-06-08 DIAGNOSIS — I5022 Chronic systolic (congestive) heart failure: Secondary | ICD-10-CM

## 2022-06-08 DIAGNOSIS — I2510 Atherosclerotic heart disease of native coronary artery without angina pectoris: Secondary | ICD-10-CM

## 2022-06-08 DIAGNOSIS — Z953 Presence of xenogenic heart valve: Secondary | ICD-10-CM

## 2022-06-08 DIAGNOSIS — I10 Essential (primary) hypertension: Secondary | ICD-10-CM

## 2022-06-08 LAB — ECHOCARDIOGRAM COMPLETE
AR max vel: 1.15 cm2
AV Area VTI: 1.36 cm2
AV Area mean vel: 1.21 cm2
AV Mean grad: 6 mmHg
AV Peak grad: 11.4 mmHg
Ao pk vel: 1.69 m/s

## 2022-06-08 MED ORDER — FUROSEMIDE 40 MG PO TABS: 40.0000 mg | ORAL_TABLET | Freq: Two times a day (BID) | ORAL | 1 refills | Status: DC

## 2022-06-08 MED ORDER — POTASSIUM CHLORIDE ER 10 MEQ PO TBCR: 20.0000 meq | EXTENDED_RELEASE_TABLET | Freq: Every day | ORAL | 3 refills | Status: DC

## 2022-06-08 MED ORDER — PERFLUTREN LIPID MICROSPHERE
1.0000 mL | INTRAVENOUS | Status: AC | PRN
  Administered 2022-06-08: 3 mL via INTRAVENOUS

## 2022-06-08 NOTE — Telephone Encounter (Signed)
Reviewed results and recommendations with patient. Also sent them via My Chart for his review. He was agreeable with plan and had no further questions at this time.

## 2022-06-18 ENCOUNTER — Other Ambulatory Visit
Admission: RE | Admit: 2022-06-18 | Discharge: 2022-06-18 | Disposition: A | Discharge status: Home / Self Care | Payer: Medicare Other | Attending: Physician Assistant | Admitting: Physician Assistant

## 2022-06-18 ENCOUNTER — Telehealth: Payer: Self-pay | Admitting: *Deleted

## 2022-06-18 DIAGNOSIS — N179 Acute kidney failure, unspecified: Secondary | ICD-10-CM

## 2022-06-18 DIAGNOSIS — I2510 Atherosclerotic heart disease of native coronary artery without angina pectoris: Secondary | ICD-10-CM

## 2022-06-18 DIAGNOSIS — Z953 Presence of xenogenic heart valve: Secondary | ICD-10-CM | POA: Insufficient documentation

## 2022-06-18 DIAGNOSIS — I255 Ischemic cardiomyopathy: Secondary | ICD-10-CM | POA: Diagnosis present

## 2022-06-18 DIAGNOSIS — I10 Essential (primary) hypertension: Secondary | ICD-10-CM | POA: Insufficient documentation

## 2022-06-18 DIAGNOSIS — I5022 Chronic systolic (congestive) heart failure: Secondary | ICD-10-CM | POA: Insufficient documentation

## 2022-06-18 LAB — BASIC METABOLIC PANEL
Anion gap: 7 (ref 5–15)
BUN: 42 mg/dL — ABNORMAL HIGH (ref 8–23)
CO2: 27 mmol/L (ref 22–32)
Calcium: 9.4 mg/dL (ref 8.9–10.3)
Chloride: 105 mmol/L (ref 98–111)
Creatinine, Ser: 1.25 mg/dL — ABNORMAL HIGH (ref 0.61–1.24)
GFR, Estimated: 59 mL/min — ABNORMAL LOW (ref >60)
Glucose, Bld: 153 mg/dL — ABNORMAL HIGH (ref 70–99)
Potassium: 4.7 mmol/L (ref 3.5–5.1)
Sodium: 139 mmol/L (ref 135–145)

## 2022-06-18 MED ORDER — POTASSIUM CHLORIDE ER 10 MEQ PO TBCR: 10.0000 meq | EXTENDED_RELEASE_TABLET | Freq: Every day | ORAL | 3 refills | Status: DC

## 2022-06-18 MED ORDER — FUROSEMIDE 20 MG PO TABS: 20.0000 mg | ORAL_TABLET | Freq: Every day | ORAL | 3 refills | Status: AC

## 2022-06-18 NOTE — Telephone Encounter (Signed)
Reviewed results and recommendations with patient. He verbalized understanding with no further questions at this time. He did request changes to be sent via My Chart. Will send those to him and confirmed upcoming appointment.

## 2022-06-18 NOTE — Telephone Encounter (Signed)
-----   Message from Sondra Barges, [place] sent at 06/18/2022  1:27 PM EDT ----- Following titration of Lasix based on echo findings, labs show a degree of dehydration with normal potassium and mildly elevated glucose. We can sometimes see elevations in renal function when diuresing right-sided pressures.   Recommendations: -Reduce Lasix down to 20 mg bid and KCl to 10 mEq daily -Follow up BMP 1 week to reassess renal function on lower dose Lasix -Keep follow up appointment this month -May need to pursue RHC based on his symptoms at that time

## 2022-06-21 ENCOUNTER — Telehealth: Payer: Self-pay | Admitting: Pulmonary Disease

## 2022-06-21 ENCOUNTER — Encounter: Payer: Self-pay | Admitting: Emergency Medicine

## 2022-06-21 ENCOUNTER — Observation Stay
Admission: EM | Admit: 2022-06-21 | Discharge: 2022-06-24 | Disposition: A | Discharge status: Home / Self Care | Payer: Medicare Other | Attending: Internal Medicine | Admitting: Internal Medicine

## 2022-06-21 ENCOUNTER — Other Ambulatory Visit: Payer: Self-pay

## 2022-06-21 ENCOUNTER — Emergency Department: Payer: Medicare Other

## 2022-06-21 ENCOUNTER — Inpatient Hospital Stay: Payer: Medicare Other

## 2022-06-21 DIAGNOSIS — I152 Hypertension secondary to endocrine disorders: Secondary | ICD-10-CM | POA: Diagnosis present

## 2022-06-21 DIAGNOSIS — Z720 Tobacco use: Secondary | ICD-10-CM | POA: Diagnosis present

## 2022-06-21 DIAGNOSIS — E119 Type 2 diabetes mellitus without complications: Secondary | ICD-10-CM

## 2022-06-21 DIAGNOSIS — I255 Ischemic cardiomyopathy: Secondary | ICD-10-CM | POA: Diagnosis present

## 2022-06-21 DIAGNOSIS — E1165 Type 2 diabetes mellitus with hyperglycemia: Secondary | ICD-10-CM | POA: Diagnosis present

## 2022-06-21 DIAGNOSIS — E1169 Type 2 diabetes mellitus with other specified complication: Secondary | ICD-10-CM | POA: Diagnosis present

## 2022-06-21 DIAGNOSIS — Z951 Presence of aortocoronary bypass graft: Secondary | ICD-10-CM

## 2022-06-21 DIAGNOSIS — I5022 Chronic systolic (congestive) heart failure: Secondary | ICD-10-CM

## 2022-06-21 DIAGNOSIS — Z20822 Contact with and (suspected) exposure to covid-19: Secondary | ICD-10-CM | POA: Insufficient documentation

## 2022-06-21 DIAGNOSIS — I10 Essential (primary) hypertension: Secondary | ICD-10-CM | POA: Diagnosis not present

## 2022-06-21 DIAGNOSIS — E785 Hyperlipidemia, unspecified: Secondary | ICD-10-CM | POA: Diagnosis not present

## 2022-06-21 DIAGNOSIS — I272 Pulmonary hypertension, unspecified: Secondary | ICD-10-CM

## 2022-06-21 DIAGNOSIS — N179 Acute kidney failure, unspecified: Secondary | ICD-10-CM | POA: Diagnosis present

## 2022-06-21 DIAGNOSIS — J441 Chronic obstructive pulmonary disease with (acute) exacerbation: Secondary | ICD-10-CM | POA: Diagnosis not present

## 2022-06-21 DIAGNOSIS — I2510 Atherosclerotic heart disease of native coronary artery without angina pectoris: Secondary | ICD-10-CM | POA: Diagnosis not present

## 2022-06-21 DIAGNOSIS — H8110 Benign paroxysmal vertigo, unspecified ear: Secondary | ICD-10-CM | POA: Clinically undetermined

## 2022-06-21 DIAGNOSIS — R0602 Shortness of breath: Secondary | ICD-10-CM | POA: Diagnosis present

## 2022-06-21 DIAGNOSIS — N289 Disorder of kidney and ureter, unspecified: Secondary | ICD-10-CM | POA: Diagnosis present

## 2022-06-21 DIAGNOSIS — J9621 Acute and chronic respiratory failure with hypoxia: Secondary | ICD-10-CM | POA: Diagnosis present

## 2022-06-21 DIAGNOSIS — Z953 Presence of xenogenic heart valve: Secondary | ICD-10-CM

## 2022-06-21 DIAGNOSIS — E663 Overweight: Secondary | ICD-10-CM | POA: Diagnosis present

## 2022-06-21 DIAGNOSIS — E875 Hyperkalemia: Secondary | ICD-10-CM | POA: Diagnosis present

## 2022-06-21 DIAGNOSIS — I5042 Chronic combined systolic (congestive) and diastolic (congestive) heart failure: Secondary | ICD-10-CM

## 2022-06-21 LAB — BASIC METABOLIC PANEL
Anion gap: 9 (ref 5–15)
BUN: 48 mg/dL — ABNORMAL HIGH (ref 8–23)
CO2: 23 mmol/L (ref 22–32)
Calcium: 9.8 mg/dL (ref 8.9–10.3)
Chloride: 103 mmol/L (ref 98–111)
Creatinine, Ser: 1.13 mg/dL (ref 0.61–1.24)
GFR, Estimated: >60 mL/min (ref >60)
Glucose, Bld: 131 mg/dL — ABNORMAL HIGH (ref 70–99)
Potassium: 4.9 mmol/L (ref 3.5–5.1)
Sodium: 135 mmol/L (ref 135–145)

## 2022-06-21 LAB — CBC WITH DIFFERENTIAL/PLATELET
Abs Immature Granulocytes: 0.04 10*3/uL (ref 0.00–0.07)
Basophils Absolute: 0.1 10*3/uL (ref 0.0–0.1)
Basophils Relative: 1 %
Eosinophils Absolute: 0.3 10*3/uL (ref 0.0–0.5)
Eosinophils Relative: 3 %
HCT: 50.6 % (ref 39.0–52.0)
Hemoglobin: 17.2 g/dL — ABNORMAL HIGH (ref 13.0–17.0)
Immature Granulocytes: 0 %
Lymphocytes Relative: 23 %
Lymphs Abs: 2.3 10*3/uL (ref 0.7–4.0)
MCH: 29 pg (ref 26.0–34.0)
MCHC: 34 g/dL (ref 30.0–36.0)
MCV: 85.2 fL (ref 80.0–100.0)
Monocytes Absolute: 0.8 10*3/uL (ref 0.1–1.0)
Monocytes Relative: 8 %
Neutro Abs: 6.5 10*3/uL (ref 1.7–7.7)
Neutrophils Relative %: 65 %
Platelets: 193 10*3/uL (ref 150–400)
RBC: 5.94 MIL/uL — ABNORMAL HIGH (ref 4.22–5.81)
RDW: 14.6 % (ref 11.5–15.5)
WBC: 10 10*3/uL (ref 4.0–10.5)
nRBC: 0 % (ref 0.0–0.2)

## 2022-06-21 LAB — TROPONIN I (HIGH SENSITIVITY)
Troponin I (High Sensitivity): 19 ng/L — ABNORMAL HIGH (ref <18)
Troponin I (High Sensitivity): 18 ng/L — ABNORMAL HIGH (ref <18)

## 2022-06-21 LAB — CBG MONITORING, ED: Glucose-Capillary: 314 mg/dL — ABNORMAL HIGH (ref 70–99)

## 2022-06-21 LAB — BRAIN NATRIURETIC PEPTIDE: B Natriuretic Peptide: 829.9 pg/mL — ABNORMAL HIGH (ref 0.0–100.0)

## 2022-06-21 LAB — RESP PANEL BY RT-PCR (FLU A&B, COVID) ARPGX2
Influenza A by PCR: NEGATIVE
Influenza B by PCR: NEGATIVE
SARS Coronavirus 2 by RT PCR: NEGATIVE

## 2022-06-21 MED ORDER — PREDNISONE 20 MG PO TABS: 60.0000 mg | ORAL_TABLET | Freq: Once | ORAL | Status: DC

## 2022-06-21 MED ORDER — IOHEXOL 350 MG/ML SOLN
75.0000 mL | Freq: Once | INTRAVENOUS | Status: AC | PRN
  Administered 2022-06-21: 75 mL via INTRAVENOUS

## 2022-06-21 MED ORDER — BUDESONIDE 0.5 MG/2ML IN SUSP
0.5000 mg | Freq: Two times a day (BID) | RESPIRATORY_TRACT | Status: DC
  Administered 2022-06-21 – 2022-06-24 (×6): 0.5 mg via RESPIRATORY_TRACT
  Filled 2022-06-21 (×6): qty 2

## 2022-06-21 MED ORDER — REVEFENACIN 175 MCG/3ML IN SOLN
175.0000 ug | Freq: Every day | RESPIRATORY_TRACT | Status: DC
  Administered 2022-06-22 – 2022-06-24 (×3): 175 ug via RESPIRATORY_TRACT
  Filled 2022-06-21 (×3): qty 3

## 2022-06-21 MED ORDER — IPRATROPIUM-ALBUTEROL 0.5-2.5 (3) MG/3ML IN SOLN
3.0000 mL | Freq: Four times a day (QID) | RESPIRATORY_TRACT | Status: DC
  Administered 2022-06-21 – 2022-06-24 (×9): 3 mL via RESPIRATORY_TRACT
  Filled 2022-06-21 (×9): qty 3

## 2022-06-21 MED ORDER — EZETIMIBE 10 MG PO TABS
10.0000 mg | ORAL_TABLET | Freq: Every day | ORAL | Status: DC
  Administered 2022-06-21 – 2022-06-23 (×3): 10 mg via ORAL
  Filled 2022-06-21 (×3): qty 1

## 2022-06-21 MED ORDER — ACETAMINOPHEN 500 MG PO TABS: 1000.0000 mg | ORAL_TABLET | Freq: Every day | ORAL | Status: DC | PRN

## 2022-06-21 MED ORDER — METOPROLOL SUCCINATE ER 50 MG PO TB24
50.0000 mg | ORAL_TABLET | Freq: Every day | ORAL | Status: DC
  Administered 2022-06-22 – 2022-06-24 (×3): 50 mg via ORAL
  Filled 2022-06-21 (×3): qty 1

## 2022-06-21 MED ORDER — IPRATROPIUM-ALBUTEROL 0.5-2.5 (3) MG/3ML IN SOLN
3.0000 mL | Freq: Once | RESPIRATORY_TRACT | Status: AC
Start: 2022-06-21 — End: 2022-06-21
  Administered 2022-06-21: 3 mL via RESPIRATORY_TRACT
  Filled 2022-06-21: qty 3

## 2022-06-21 MED ORDER — PREDNISONE 20 MG PO TABS
40.0000 mg | ORAL_TABLET | Freq: Every day | ORAL | Status: DC
  Administered 2022-06-23 – 2022-06-24 (×2): 40 mg via ORAL
  Filled 2022-06-21 (×2): qty 2

## 2022-06-21 MED ORDER — ALBUTEROL SULFATE (2.5 MG/3ML) 0.083% IN NEBU: 2.5000 mg | INHALATION_SOLUTION | RESPIRATORY_TRACT | Status: DC | PRN

## 2022-06-21 MED ORDER — HYDROCODONE-ACETAMINOPHEN 5-325 MG PO TABS: 1.0000 | ORAL_TABLET | ORAL | Status: DC | PRN

## 2022-06-21 MED ORDER — ONDANSETRON HCL 4 MG/2ML IJ SOLN
4.0000 mg | Freq: Four times a day (QID) | INTRAMUSCULAR | Status: DC | PRN
  Administered 2022-06-22: 4 mg via INTRAVENOUS
  Filled 2022-06-21: qty 2

## 2022-06-21 MED ORDER — INSULIN ASPART 100 UNIT/ML IJ SOLN
0.0000 [IU] | Freq: Three times a day (TID) | INTRAMUSCULAR | Status: DC
  Administered 2022-06-22: 15 [IU] via SUBCUTANEOUS
  Administered 2022-06-22: 3 [IU] via SUBCUTANEOUS
  Administered 2022-06-22: 5 [IU] via SUBCUTANEOUS
  Administered 2022-06-23 (×2): 3 [IU] via SUBCUTANEOUS
  Administered 2022-06-23: 8 [IU] via SUBCUTANEOUS
  Administered 2022-06-24: 2 [IU] via SUBCUTANEOUS
  Filled 2022-06-21 (×7): qty 1

## 2022-06-21 MED ORDER — ASPIRIN 81 MG PO TBEC
81.0000 mg | DELAYED_RELEASE_TABLET | Freq: Every day | ORAL | Status: DC
  Administered 2022-06-22 – 2022-06-24 (×3): 81 mg via ORAL
  Filled 2022-06-21 (×3): qty 1

## 2022-06-21 MED ORDER — FUROSEMIDE 20 MG PO TABS
20.0000 mg | ORAL_TABLET | Freq: Two times a day (BID) | ORAL | Status: DC
  Administered 2022-06-21 – 2022-06-24 (×6): 20 mg via ORAL
  Filled 2022-06-21 (×7): qty 1

## 2022-06-21 MED ORDER — ROSUVASTATIN CALCIUM 10 MG PO TABS
10.0000 mg | ORAL_TABLET | Freq: Every day | ORAL | Status: DC
  Administered 2022-06-22 – 2022-06-24 (×3): 10 mg via ORAL
  Filled 2022-06-21 (×3): qty 1

## 2022-06-21 MED ORDER — BUSPIRONE HCL 10 MG PO TABS
10.0000 mg | ORAL_TABLET | Freq: Two times a day (BID) | ORAL | Status: DC
  Administered 2022-06-21 – 2022-06-24 (×6): 10 mg via ORAL
  Filled 2022-06-21: qty 1
  Filled 2022-06-21: qty 2
  Filled 2022-06-21 (×2): qty 1
  Filled 2022-06-21: qty 2
  Filled 2022-06-21: qty 1

## 2022-06-21 MED ORDER — ACETAMINOPHEN 650 MG RE SUPP: 650.0000 mg | Freq: Four times a day (QID) | RECTAL | Status: DC | PRN

## 2022-06-21 MED ORDER — INSULIN ASPART 100 UNIT/ML IJ SOLN
0.0000 [IU] | Freq: Every day | INTRAMUSCULAR | Status: DC
  Administered 2022-06-21: 4 [IU] via SUBCUTANEOUS
  Administered 2022-06-22: 3 [IU] via SUBCUTANEOUS
  Administered 2022-06-23: 2 [IU] via SUBCUTANEOUS
  Filled 2022-06-21 (×3): qty 1

## 2022-06-21 MED ORDER — ONDANSETRON HCL 4 MG PO TABS: 4.0000 mg | ORAL_TABLET | Freq: Four times a day (QID) | ORAL | Status: DC | PRN

## 2022-06-21 MED ORDER — ENOXAPARIN SODIUM 40 MG/0.4ML IJ SOSY
40.0000 mg | PREFILLED_SYRINGE | INTRAMUSCULAR | Status: DC
  Administered 2022-06-21 – 2022-06-23 (×3): 40 mg via SUBCUTANEOUS
  Filled 2022-06-21 (×3): qty 0.4

## 2022-06-21 MED ORDER — METHYLPREDNISOLONE SODIUM SUCC 40 MG IJ SOLR
40.0000 mg | Freq: Two times a day (BID) | INTRAMUSCULAR | Status: AC
  Administered 2022-06-21 – 2022-06-22 (×2): 40 mg via INTRAVENOUS
  Filled 2022-06-21 (×2): qty 1

## 2022-06-21 MED ORDER — SACUBITRIL-VALSARTAN 97-103 MG PO TABS
1.0000 | ORAL_TABLET | Freq: Two times a day (BID) | ORAL | Status: DC
  Administered 2022-06-21 – 2022-06-22 (×3): 1 via ORAL
  Filled 2022-06-21 (×4): qty 1

## 2022-06-21 MED ORDER — MORPHINE SULFATE (PF) 2 MG/ML IV SOLN: 2.0000 mg | INTRAVENOUS | Status: DC | PRN

## 2022-06-21 MED ORDER — IPRATROPIUM-ALBUTEROL 0.5-2.5 (3) MG/3ML IN SOLN
3.0000 mL | Freq: Once | RESPIRATORY_TRACT | Status: AC
  Administered 2022-06-21: 3 mL via RESPIRATORY_TRACT
  Filled 2022-06-21: qty 3

## 2022-06-21 MED ORDER — TRAZODONE HCL 50 MG PO TABS
50.0000 mg | ORAL_TABLET | Freq: Every evening | ORAL | Status: DC | PRN
Start: 2022-06-21 — End: 2022-06-24
  Administered 2022-06-21 – 2022-06-23 (×3): 50 mg via ORAL
  Filled 2022-06-21 (×3): qty 1

## 2022-06-21 MED ORDER — ARFORMOTEROL TARTRATE 15 MCG/2ML IN NEBU
15.0000 ug | INHALATION_SOLUTION | Freq: Two times a day (BID) | RESPIRATORY_TRACT | Status: DC
  Administered 2022-06-21 – 2022-06-24 (×6): 15 ug via RESPIRATORY_TRACT
  Filled 2022-06-21 (×7): qty 2

## 2022-06-21 MED ORDER — ACETAMINOPHEN 325 MG PO TABS: 650.0000 mg | ORAL_TABLET | Freq: Four times a day (QID) | ORAL | Status: DC | PRN

## 2022-06-21 MED ORDER — METHYLPREDNISOLONE SODIUM SUCC 125 MG IJ SOLR
125.0000 mg | Freq: Once | INTRAMUSCULAR | Status: AC
  Administered 2022-06-21: 125 mg via INTRAVENOUS
  Filled 2022-06-21: qty 2

## 2022-06-21 MED ORDER — GUAIFENESIN ER 600 MG PO TB12
1200.0000 mg | ORAL_TABLET | Freq: Two times a day (BID) | ORAL | Status: DC | PRN
Start: 2022-06-21 — End: 2022-06-24
  Administered 2022-06-23 (×2): 1200 mg via ORAL
  Filled 2022-06-21 (×2): qty 2

## 2022-06-21 NOTE — ED Notes (Signed)
Ambulate the pt with 4L , pt states he felt light headed, pt was satting at 88%, ED MD made aware.

## 2022-06-21 NOTE — Assessment & Plan Note (Addendum)
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  Patient has been off of his home medications.  Low-dose Lasix.

## 2022-06-21 NOTE — Telephone Encounter (Signed)
Spoke to patient.  He reports of increased SOB with exertion x2w. He feels that this is due to heat.  Denied cough, wheezing, F/C/S or additional sx.  3-4L cont. Spo2 maintaining around 92%. Yupelri once dailt, perforomist BID, Pulmicort BID, albuterol HFA 2-3x daily and albuterol solution daily.   Dr. Jayme Cloud, please advise. Thanks

## 2022-06-21 NOTE — Assessment & Plan Note (Signed)
Sliding scale insulin coverage 

## 2022-06-21 NOTE — ED Notes (Signed)
Patient transported to X-ray 

## 2022-06-21 NOTE — Telephone Encounter (Signed)
Patient is aware of recommendations and voiced his understanding.  He stated that he will have to wait for his wife to come home to take him. Advised patient to call EMS if sx worsen in the meantime.  Nothing further needed.

## 2022-06-21 NOTE — ED Triage Notes (Signed)
Pt arrives via EMS from home with worsening SOB over the past week. Hx of COPD and on 3-4L [place]. Also endorses CP, better while resting.

## 2022-06-21 NOTE — ED Provider Notes (Signed)
[facility] Provider Note    Event Date/Time   First MD Initiated Contact with Patient 06/21/22 1514     (approximate)   History   Chief Complaint Shortness of Breath   HPI  Topher Buenaventura is a 76 y.o. male with past medical history of hypertension, hyperlipidemia, diabetes, CAD status post CABG, COPD, chronic hypoxic respiratory failure on 3 to 4 L nasal cannula, CHF, and aortic valve replacement who presents to the ED complaining of shortness of breath.  Patient reports that he has been dealing with increasing difficulty breathing for about the past 2 weeks.  He endorses a nonproductive cough, states he primarily gets out of breath with any exertion.  This has been associated with intermittent pain in his chest which she describes as a squeezing.  Squeezing discomfort has been going on for multiple months and does not necessarily occur with exertion, can occur either on the left or right side of his chest at different times.  He states he has been seen by his cardiologist for the pain with unremarkable work-up.  He has not noticed any pain or swelling in his legs, states he has been compliant with his usual medications.     Physical Exam   Triage Vital Signs: ED Triage Vitals  Enc Vitals Group     BP 06/21/22 1515 137/72     Pulse Rate 06/21/22 1515 91     Resp 06/21/22 1515 (!) 21     Temp 06/21/22 1515 (!) 97.4 F (36.3 C)     Temp Source 06/21/22 1515 Oral     SpO2 06/21/22 1515 97 %     Weight 06/21/22 1516 161 lb (73 kg)     Height 06/21/22 1516 5\' 5"  (1.651 m)     Head Circumference --      Peak Flow --      Pain Score 06/21/22 1516 4     Pain Loc --      Pain Edu? --      Excl. in GC? --     Most recent vital signs: Vitals:   06/21/22 2002 06/21/22 2254  BP:  (!) 119/52  Pulse:  97  Resp:  (!) 25  Temp: 97.7 F (36.5 C) 97.9 F (36.6 C)  SpO2:  93%    Constitutional: Alert and oriented. Eyes: Conjunctivae are normal. Head:  Atraumatic. Nose: No congestion/rhinnorhea. Mouth/Throat: Mucous membranes are moist.  Cardiovascular: Normal rate, regular rhythm. Grossly normal heart sounds.  2+ radial pulses bilaterally. Respiratory: Mildly tachypneic with increased respiratory effort, poor air movement throughout with expiratory wheezing noted.  No rales or rhonchi.  Anterior chest wall tenderness to palpation noted. Gastrointestinal: Soft and nontender. No distention. Musculoskeletal: No lower extremity tenderness nor edema.  Neurologic:  Normal speech and language. No gross focal neurologic deficits are appreciated.    ED Results / Procedures / Treatments   Labs (all labs ordered are listed, but only abnormal results are displayed) Labs Reviewed  CBC WITH DIFFERENTIAL/PLATELET - Abnormal; Notable for the following components:      Result Value   RBC 5.94 (*)    Hemoglobin 17.2 (*)    All other components within normal limits  BRAIN NATRIURETIC PEPTIDE - Abnormal; Notable for the following components:   B Natriuretic Peptide 829.9 (*)    All other components within normal limits  BASIC METABOLIC PANEL - Abnormal; Notable for the following components:   Glucose, Bld 131 (*)    BUN 48 (*)  All other components within normal limits  CBG MONITORING, ED - Abnormal; Notable for the following components:   Glucose-Capillary 314 (*)    All other components within normal limits  TROPONIN I (HIGH SENSITIVITY) - Abnormal; Notable for the following components:   Troponin I (High Sensitivity) 19 (*)    All other components within normal limits  TROPONIN I (HIGH SENSITIVITY) - Abnormal; Notable for the following components:   Troponin I (High Sensitivity) 18 (*)    All other components within normal limits  RESP PANEL BY RT-PCR (FLU A&B, COVID) ARPGX2  HEMOGLOBIN A1C     EKG  ED ECG REPORT I, Chesley Noon, the attending physician, personally viewed and interpreted this ECG.   Date: 06/21/2022  EKG Time:  15:14  Rate: 91  Rhythm: normal sinus rhythm  Axis: Normal  Intervals:right bundle branch block and left posterior fascicular block  ST&T Change: None  RADIOLOGY Chest x-ray reviewed and interpreted by me with no infiltrate, edema, or effusion.  PROCEDURES:  Critical Care performed: No  Procedures   MEDICATIONS ORDERED IN ED: Medications  acetaminophen (TYLENOL) tablet 1,000 mg (has no administration in time range)  aspirin EC tablet 81 mg (has no administration in time range)  ezetimibe (ZETIA) tablet 10 mg (10 mg Oral Given 06/21/22 2125)  furosemide (LASIX) tablet 20 mg (20 mg Oral Given 06/21/22 2127)  metoprolol succinate (TOPROL-XL) 24 hr tablet 50 mg (has no administration in time range)  rosuvastatin (CRESTOR) tablet 10 mg (has no administration in time range)  sacubitril-valsartan (ENTRESTO) 97-103 mg per tablet (1 tablet Oral Given 06/21/22 2127)  busPIRone (BUSPAR) tablet 10 mg (10 mg Oral Given 06/21/22 2229)  traZODone (DESYREL) tablet 50 mg (50 mg Oral Given 06/21/22 2254)  budesonide (PULMICORT) nebulizer solution 0.5 mg (0.5 mg Nebulization Given 06/21/22 2128)  arformoterol (BROVANA) nebulizer solution 15 mcg (15 mcg Nebulization Given 06/21/22 2229)  guaiFENesin (MUCINEX) 12 hr tablet 1,200 mg (has no administration in time range)  revefenacin (YUPELRI) nebulizer solution 175 mcg (has no administration in time range)  enoxaparin (LOVENOX) injection 40 mg (40 mg Subcutaneous Given 06/21/22 2126)  acetaminophen (TYLENOL) tablet 650 mg (has no administration in time range)    Or  acetaminophen (TYLENOL) suppository 650 mg (has no administration in time range)  ondansetron (ZOFRAN) tablet 4 mg (has no administration in time range)    Or  ondansetron (ZOFRAN) injection 4 mg (has no administration in time range)  methylPREDNISolone sodium succinate (SOLU-MEDROL) 40 mg/mL injection 40 mg (40 mg Intravenous Given 06/21/22 2126)    Followed by  predniSONE (DELTASONE) tablet 40 mg (has  no administration in time range)  ipratropium-albuterol (DUONEB) 0.5-2.5 (3) MG/3ML nebulizer solution 3 mL (3 mLs Nebulization Given 06/21/22 2134)  albuterol (PROVENTIL) (2.5 MG/3ML) 0.083% nebulizer solution 2.5 mg (has no administration in time range)  insulin aspart (novoLOG) injection 0-5 Units (4 Units Subcutaneous Given 06/21/22 2229)  insulin aspart (novoLOG) injection 0-15 Units (has no administration in time range)  HYDROcodone-acetaminophen (NORCO/VICODIN) 5-325 MG per tablet 1-2 tablet (has no administration in time range)  morphine (PF) 2 MG/ML injection 2 mg (has no administration in time range)  ipratropium-albuterol (DUONEB) 0.5-2.5 (3) MG/3ML nebulizer solution 3 mL (3 mLs Nebulization Given 06/21/22 1527)  methylPREDNISolone sodium succinate (SOLU-MEDROL) 125 mg/2 mL injection 125 mg (125 mg Intravenous Given 06/21/22 1528)  ipratropium-albuterol (DUONEB) 0.5-2.5 (3) MG/3ML nebulizer solution 3 mL (3 mLs Nebulization Given 06/21/22 1920)  iohexol (OMNIPAQUE) 350 MG/ML injection 75 mL (75 mLs  Intravenous Contrast Given 06/21/22 2313)     IMPRESSION / MDM / ASSESSMENT AND PLAN / ED COURSE  I reviewed the triage vital signs and the nursing notes.                              76 y.o. male with past medical history of hypertension, hyperlipidemia, diabetes, CAD status post CABG, CHF, aortic valve replacement, COPD with chronic hypoxic respiratory failure on 3 to 4 L nasal cannula who presents to the ED with worsening difficulty breathing and dyspnea on exertion over the past 2 weeks with intermittent pain in his chest for multiple months.  Patient's presentation is most consistent with acute presentation with potential threat to life or bodily function.  Differential diagnosis includes, but is not limited to, ACS, PE, pneumonia, pneumothorax, dissection, CHF exacerbation, COPD exacerbation, COVID-19, bronchitis.  Patient nontoxic-appearing and in no acute distress, does have mildly  increased work of breathing with tachypnea and poor air movement throughout.  He does not appear fluid overloaded and I am concerned primarily for COPD exacerbation.  We will treat with dose of steroids and DuoNeb, further assess with chest x-ray.  EKG shows no evidence of arrhythmia or ischemia and low suspicion for ACS given his atypical chest pain.  He has previously been evaluated by cardiology for this and thought to be chronic musculoskeletal pain, consistent with his evaluation today given his reproducible pain.  2 sets of troponin are within normal limits, BNP mildly elevated but chest x-ray without signs of CHF.  Patient reports feeling slightly better following DuoNeb's and steroids, but continues to feel significantly short of breath with even mild exertion.  With ambulation to the bathroom, he was noted to drop oxygen saturations to 88% on his usual 4 L nasal cannula.  CTA was performed and negative for PE, does show signs of pulmonary hypertension.  Case discussed with hospitalist for admission.      FINAL CLINICAL IMPRESSION(S) / ED DIAGNOSES   Final diagnoses:  COPD exacerbation (HCC)     Rx / DC Orders   ED Discharge Orders     None        Note:  This document was prepared using Dragon voice recognition software and may include unintentional dictation errors.   Chesley Noon, MD 06/21/22 2351

## 2022-06-21 NOTE — Assessment & Plan Note (Signed)
No acute disused suspected 

## 2022-06-21 NOTE — Assessment & Plan Note (Addendum)
Initially presented with hypoxia on 5 L.  Down to 4.  Continue steroids and nebs.  Trying Lasix for blood pressure which tolerates.

## 2022-06-21 NOTE — Assessment & Plan Note (Addendum)
Schedule and as needed nebulized bronchodilator treatment IV steroids, antitussives, supplemental oxygen.  Slow improvement.

## 2022-06-21 NOTE — ED Notes (Signed)
Received report from Margaret RN. 

## 2022-06-21 NOTE — ED Notes (Signed)
Pt is alert and oriented, speaking in full sentences, breathing treatment in progress, pending reevaluation.

## 2022-06-21 NOTE — Telephone Encounter (Signed)
I am concerned that this may not be due to the lungs but may be related to his poor heart function.  I recommend that he be seen in the emergency room.

## 2022-06-21 NOTE — H&P (Signed)
History and Physical    Patient: Harold Parker DOB: May 09, 1946 DOA: 06/21/2022 DOS: the patient was seen and examined on 06/21/2022 PCP: Lajean Manes, MD  Patient coming from: Home  Chief Complaint:  Chief Complaint  Patient presents with   Shortness of Breath    HPI: Harold Parker is a 76 y.o. 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  He denies pain or swelling in his legs and endorses compliance with his medication.  About 2 weeks ago he had a prednisone taper ordered by his cardiologist to address ongoing shortness of breath according to chart review. ED course and data review: Vitals in the ED initially unremarkable however he reportedly desaturated to 88% on flow rate of 3 L while ambulating following treatment Labs troponin 18, BNP 829.  CBC, BMP unremarkable.  COVID and flu negative EKG, personally viewed and interpreted: Sinus at 91 with RBBB Chest x-ray with no acute intrathoracic process CTA PE protocol pending  Patient was treated with DuoNebs x2 and Solu-Medrol.  Hospitalist consulted for admission.    Past Medical History:  Diagnosis Date   Bilateral carotid bruits    a. 01/2018 U/S: < 50% bilat ICA stenosis.   CAD (coronary artery disease)    a. 1998 s/p MI and BMS [place]); b. 1999 redo PCI/rotablator in setting of what sounds like ISR;  c. Multiple stress tests over the years - last ~ 2017, reportedly nl; d. 01/2018 NSTEMI/Cath: LM 16m/d, LAD 50p, 40p/m, D1 60ost, OM1 95, RCA 100ost/p w/ L->R collats, EF 45%; e. s/p 3V CABG 02/19/18 (LIMA-LAD, VG-D1, VG-OM)   CHF (congestive heart failure) (HCC)    Chronic lower back pain    COPD (chronic obstructive pulmonary disease) ([place])    GIB  (gastrointestinal bleeding)    a. 02/2018 GIB and anemia w/ Hgb of 4.7 on presentation; b. 03/2018 EGD: 2 nonbleeding duodenal ulcers.   HTN (hypertension)    Hypercholesteremia    Ischemic cardiomyopathy    a. 01/2018 Echo: EF 40-45%, mid-apicalanteroseptal, ant, apical sev HK, mod apicalinferior HK. Gr2 DD. Mod AS, mild MR, mod dil LA, PASP 33mmHg.   Moderate aortic stenosis    a. 01/2018 Echo: Mod AS, mean grad (S) 15mmHg, Valve area (VTI) 1.06 cm^2, (Vmax) 1.27 cm^2; b. 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   LIMA to LAD, SVG to D1, SVG to OM, EVH via right thigh and leg   Tobacco abuse    Type II diabetes mellitus ([place])    Past Surgical History:  Procedure Laterality Date   ABDOMINAL AORTOGRAM N/A 03/04/2019   Procedure: ABDOMINAL AORTOGRAM;  Surgeon: Wellington Hampshire, MD;  Location: [place] [facility];  Service: Cardiovascular;  Laterality: N/A;   AORTIC VALVE REPLACEMENT N/A 02/19/2018   Procedure: AORTIC VALVE REPLACEMENT (AVR);  Surgeon: Rexene Alberts, MD;  Location: [facility];  Service: Open Heart Surgery;  Laterality: N/A;   COLONOSCOPY     CORONARY ANGIOPLASTY WITH STENT PLACEMENT  ~ 1998/1999   CORONARY ARTERY BYPASS GRAFT  N/A 02/19/2018   Procedure: CORONARY ARTERY BYPASS GRAFTING (CABG) x three , using left internal mammary artery and right leg greater saphenous vein harvested endoscopically;  Surgeon: Rexene Alberts, MD;  Location: [place];  Service: Open Heart Surgery;  Laterality: N/A;   ESOPHAGOGASTRODUODENOSCOPY (EGD) WITH PROPOFOL N/A 03/18/2018   Procedure: ESOPHAGOGASTRODUODENOSCOPY (EGD) WITH PROPOFOL;  Surgeon: Lucilla Lame, MD;  Location: [facility];  Service: Endoscopy;  Laterality: N/A;   LEFT HEART CATH AND CORONARY ANGIOGRAPHY N/A 02/06/2018    Procedure: LEFT HEART CATH AND CORONARY ANGIOGRAPHY;  Surgeon: Wellington Hampshire, MD;  Location: [place] [facility];  Service: Cardiovascular;  Laterality: N/A;   LOWER EXTREMITY ANGIOGRAPHY Bilateral 03/04/2019   Procedure: Lower Extremity Angiography;  Surgeon: Wellington Hampshire, MD;  Location: [place] [facility];  Service: Cardiovascular;  Laterality: Bilateral;   PERIPHERAL VASCULAR ATHERECTOMY Left 03/04/2019   Procedure: PERIPHERAL VASCULAR ATHERECTOMY;  Surgeon: Wellington Hampshire, MD;  Location: [place] [facility];  Service: Cardiovascular;  Laterality: Left;  SFA   TEE WITHOUT CARDIOVERSION N/A 02/19/2018   Procedure: TRANSESOPHAGEAL ECHOCARDIOGRAM (TEE);  Surgeon: Rexene Alberts, MD;  Location: [facility];  Service: Open Heart Surgery;  Laterality: N/A;   TONSILLECTOMY     Social History:  reports that he quit smoking about 4 years ago. His smoking use included cigarettes. He has a 40.00 pack-year smoking history. He has never used smokeless tobacco. He reports that he does not drink alcohol and does not use drugs.  Allergies  Allergen Reactions   Atorvastatin Other (See Comments)    Leg aches and weakness  Other reaction(s): Other (See Comments) Leg aches and weakness  Other reaction(s): Other (See Comments) Leg aches and weakness    Family History  Problem Relation Age of Onset   Lymphoma Mother    Peripheral vascular disease Father     Prior to Admission medications   Medication Sig Start Date End Date Taking? Authorizing Provider  acetaminophen (TYLENOL) 500 MG tablet Take 1,000 mg by mouth daily as needed for moderate pain or headache.    [provider]  albuterol (PROVENTIL) (2.5 MG/3ML) 0.083% nebulizer solution Take 3 mLs (2.5 mg total) by nebulization every 6 (six) hours as needed for wheezing or shortness of breath. 03/01/21   Tyler Pita, MD  ALPRAZolam Duanne Moron) 0.5 MG tablet Take 0.5 mg by mouth 2 (two) times daily.    [provider]  aspirin  EC 81 MG tablet Take 81 mg by mouth daily.    [provider]  budesonide (PULMICORT) 0.5 MG/2ML nebulizer solution TAKE 2MLS BY NEBULIZATION TWICE DAILY USE AFTER PERFOROMIST 02/07/22   Tyler Pita, MD  busPIRone (BUSPAR) 10 MG tablet Take 10 mg by mouth 2 (two) times daily. 04/15/21   [provider]  cholecalciferol (VITAMIN D3) 25 MCG (1000 UNIT) tablet Take 4,000 Units by mouth daily.    [provider]  esomeprazole (NEXIUM) 40 MG capsule Take 40 mg by mouth at bedtime.     [provider]  ezetimibe (ZETIA) 10 MG tablet Take 10 mg by mouth at bedtime.     [provider]  formoterol (PERFOROMIST) 20 MCG/2ML nebulizer solution TAKE 2 MLS BY MOUTH BY NEBULIZATION TWICE DAILY 02/05/22   Tyler Pita, MD  furosemide (LASIX) 20 MG tablet Take 1 tablet (20 mg total) by mouth daily. 06/18/22 09/16/22  Dunn, Areta Haber, PA-C  glipiZIDE (GLUCOTROL) 5 MG tablet Take 1 tablet (5 mg total) by mouth  2 (two) times daily before a meal. Take 5 mg in the morning and take a second 5 mg dose at night on Mon, Wed, and Fri Patient taking differently: Take 5 mg by mouth 2 (two) times daily before a meal. 12/25/21   Leeroy Bock, MD  guaiFENesin (MUCINEX) 600 MG 12 hr tablet Take 1,200 mg by mouth every 12 (twelve) hours as needed. 01/23/21   [provider]  metFORMIN (GLUCOPHAGE) 1000 MG tablet TAKE 1 TABLET BY MOUTH TWICE DAILY WITH A MEAL 04/27/18   Lovett Sox, MD  metoprolol succinate (TOPROL-XL) 50 MG 24 hr tablet TAKE 1 TABLET BY MOUTH EVERY DAY 02/09/22   Iran Ouch, MD  Nebulizers (COMPRESSOR/NEBULIZER) MISC Use as directed 10/26/21   Salena Saner, MD  potassium chloride (KLOR-CON) 10 MEQ tablet Take 1 tablet (10 mEq total) by mouth daily. 06/18/22 09/16/22  Sondra Barges, PA-C  PROAIR HFA 108 [phone] Base) MCG/ACT inhaler Inhale 1 puff into the lungs every 4 (four) hours as needed for wheezing or shortness of breath. 02/04/19   [provider]  revefenacin (YUPELRI) 175 MCG/3ML nebulizer solution Take 3 mLs (175 mcg total) by nebulization daily. 03/06/22   Salena Saner, MD  rosuvastatin (CRESTOR) 10 MG tablet TAKE 1 TABLET BY MOUTH EVERY DAY 03/01/22   Iran Ouch, MD  sacubitril-valsartan (ENTRESTO) 97-103 MG Take 1 tablet by mouth 2 (two) times daily. 04/02/22   Dunn, Raymon Mutton, PA-C  traZODone (DESYREL) 50 MG tablet Take 50 mg by mouth at bedtime as needed. 02/09/21   [provider]    Physical Exam: Vitals:   06/21/22 1730 06/21/22 1800 06/21/22 1900 06/21/22 2002  BP: 124/66 116/64 114/89   Pulse: 86 83 89   Resp: (!) 21 (!) 27 19   Temp:    97.7 F (36.5 C)  TempSrc:    Oral  SpO2: 91% 92% 95%   Weight:      Height:       Physical Exam Vitals and nursing note reviewed.  Constitutional:      General: He is not in acute distress. HENT:     Head: Normocephalic and atraumatic.  Cardiovascular:     Rate and Rhythm: Normal rate and regular rhythm.     Heart sounds: Normal heart sounds.  Pulmonary:     Effort: Tachypnea present.     Breath sounds: Wheezing present.  Abdominal:     Palpations: Abdomen is soft.     Tenderness: There is no abdominal tenderness.  Neurological:     Mental Status: Mental status is at baseline.     Labs on Admission: I have personally reviewed following labs and imaging studies  CBC: Recent Labs  Lab 06/21/22 1521  WBC 10.0  NEUTROABS 6.5  HGB 17.2*  HCT 50.6  MCV 85.2  PLT 193   Basic Metabolic Panel: Recent Labs  Lab 06/18/22 1231 06/21/22 1615  NA 139 135  K 4.7 4.9  CL 105 103  CO2 27 23  GLUCOSE 153* 131*  BUN 42* 48*  CREATININE 1.25* 1.13  CALCIUM 9.4 9.8   GFR: Estimated Creatinine Clearance: 47.6 mL/min (by C-G formula based on SCr of 1.13 mg/dL). Liver Function Tests: No results for input(s): "AST", "ALT", "ALKPHOS", "BILITOT", "PROT", "ALBUMIN" in the last 168 hours. No results for input(s): "LIPASE", "AMYLASE" in the  last 168 hours. No results for input(s): "AMMONIA" in the last 168 hours. Coagulation Profile: No results for input(s): "INR", "PROTIME"  in the last 168 hours. Cardiac Enzymes: No results for input(s): "CKTOTAL", "CKMB", "CKMBINDEX", "TROPONINI" in the last 168 hours. BNP (last 3 results) No results for input(s): "PROBNP" in the last 8760 hours. HbA1C: No results for input(s): "HGBA1C" in the last 72 hours. CBG: No results for input(s): "GLUCAP" in the last 168 hours. Lipid Profile: No results for input(s): "CHOL", "HDL", "LDLCALC", "TRIG", "CHOLHDL", "LDLDIRECT" in the last 72 hours. Thyroid Function Tests: No results for input(s): "TSH", "T4TOTAL", "FREET4", "T3FREE", "THYROIDAB" in the last 72 hours. Anemia Panel: No results for input(s): "VITAMINB12", "FOLATE", "FERRITIN", "TIBC", "IRON", "RETICCTPCT" in the last 72 hours. Urine analysis:    Component Value Date/Time   COLORURINE YELLOW (A) 11/01/2021 0931   APPEARANCEUR HAZY (A) 11/01/2021 0931   LABSPEC 1.017 11/01/2021 0931   PHURINE 5.0 11/01/2021 0931   GLUCOSEU 150 (A) 11/01/2021 0931   HGBUR NEGATIVE 11/01/2021 0931   BILIRUBINUR NEGATIVE 11/01/2021 0931   KETONESUR 5 (A) 11/01/2021 0931   PROTEINUR 100 (A) 11/01/2021 0931   NITRITE NEGATIVE 11/01/2021 0931   LEUKOCYTESUR NEGATIVE 11/01/2021 0931    Radiological Exams on Admission: DG Chest 2 View  Result Date: 06/21/2022 CLINICAL DATA:  Worsening shortness of breath for 1 week, COPD EXAM: CHEST - 2 VIEW COMPARISON:  12/23/2021 FINDINGS: Frontal and lateral views of the chest demonstrate postsurgical changes from CABG and aortic valve replacement. Cardiac silhouette is unremarkable. Chronic interstitial prominence consistent with scarring. No acute airspace disease, effusion, or pneumothorax. No acute bony abnormalities. IMPRESSION: 1. No acute intrathoracic process. Electronically Signed   By: Randa Ngo M.D.   On: 06/21/2022 16:10     Data Reviewed: Relevant  notes from primary care and specialist visits, past discharge summaries as available in EHR, including Care Everywhere. Prior diagnostic testing as pertinent to current admission diagnoses Updated medications and problem lists for reconciliation ED course, including vitals, labs, imaging, treatment and response to treatment Triage notes, nursing and pharmacy notes and ED provider's notes Notable results as noted in HPI   Assessment and Plan: * COPD with acute exacerbation ([place]) Schedule and as needed nebulized bronchodilator treatment IV steroids, antitussives, supplemental oxygen  CAD S/P CABG x 3 Ischemic cardiomyopathy Chronic combined heart failure (EF 35-40 with G2 DD 06/08/2022) Patient denies chest pain, lower extremity pain or swelling Appears euvolemic.  BNP elevated above 800 but chest x-ray clear Continue to trend troponin Continue aspirin and Zetia on rosuvastatin Continue Lasix, metoprolol and Entresto   Acute on chronic respiratory failure with hypoxia (HCC) Desaturated to 88% on home flow rate of 3 to 4 L in the ED Supplemental O2 to keep sats over 90 Follow-up CTA chest to evaluate for PE Treat COPD as outlined above  S/P aortic valve replacement with bioprosthetic valve  No acute disused suspected  Insulin dependent type 2 diabetes mellitus (HCC) Sliding scale insulin coverage      DVT prophylaxis: Lovenox  Consults: none  Advance Care Planning:   Code Status: Prior   Family Communication: none  Disposition Plan: Back to previous home environment  Severity of Illness: The appropriate patient status for this patient is INPATIENT. Inpatient status is judged to be reasonable and necessary in order to provide the required intensity of service to ensure the patient's safety. The patient's presenting symptoms, physical exam findings, and initial radiographic and laboratory data in the context of their chronic comorbidities is felt to place them at high risk  for further clinical deterioration. Furthermore, it is not anticipated that the  patient will be medically stable for discharge from the hospital within 2 midnights of admission.   * I certify that at the point of admission it is my clinical judgment that the patient will require inpatient hospital care spanning beyond 2 midnights from the point of admission due to high intensity of service, high risk for further deterioration and high frequency of surveillance required.*  Author: Andris Baumann, MD 06/21/2022 8:36 PM  For on call review [email or web address].

## 2022-06-22 ENCOUNTER — Encounter: Payer: Self-pay | Admitting: Internal Medicine

## 2022-06-22 DIAGNOSIS — J9621 Acute and chronic respiratory failure with hypoxia: Secondary | ICD-10-CM

## 2022-06-22 DIAGNOSIS — E663 Overweight: Secondary | ICD-10-CM

## 2022-06-22 DIAGNOSIS — H8110 Benign paroxysmal vertigo, unspecified ear: Secondary | ICD-10-CM | POA: Diagnosis not present

## 2022-06-22 DIAGNOSIS — J441 Chronic obstructive pulmonary disease with (acute) exacerbation: Secondary | ICD-10-CM | POA: Diagnosis not present

## 2022-06-22 LAB — GLUCOSE, CAPILLARY
Glucose-Capillary: 161 mg/dL — ABNORMAL HIGH (ref 70–99)
Glucose-Capillary: 297 mg/dL — ABNORMAL HIGH (ref 70–99)

## 2022-06-22 LAB — CBG MONITORING, ED
Glucose-Capillary: 399 mg/dL — ABNORMAL HIGH (ref 70–99)
Glucose-Capillary: 207 mg/dL — ABNORMAL HIGH (ref 70–99)

## 2022-06-22 LAB — HEMOGLOBIN A1C
Hgb A1c MFr Bld: 8 % — ABNORMAL HIGH (ref 4.8–5.6)
Mean Plasma Glucose: 182.9 mg/dL

## 2022-06-22 MED ORDER — INSULIN GLARGINE-YFGN 100 UNIT/ML ~~LOC~~ SOLN
24.0000 [IU] | Freq: Every day | SUBCUTANEOUS | Status: DC
  Administered 2022-06-22 – 2022-06-24 (×3): 24 [IU] via SUBCUTANEOUS
  Filled 2022-06-22 (×3): qty 0.24

## 2022-06-22 MED ORDER — MECLIZINE HCL 25 MG PO TABS
25.0000 mg | ORAL_TABLET | Freq: Three times a day (TID) | ORAL | Status: DC | PRN
Start: 2022-06-22 — End: 2022-06-24
  Administered 2022-06-22 – 2022-06-23 (×4): 25 mg via ORAL
  Filled 2022-06-22 (×5): qty 1

## 2022-06-22 MED ORDER — FLUTICASONE PROPIONATE 50 MCG/ACT NA SUSP
1.0000 | Freq: Every day | NASAL | Status: DC
  Filled 2022-06-22: qty 16

## 2022-06-22 MED ORDER — MECLIZINE HCL 25 MG PO TABS
25.0000 mg | ORAL_TABLET | ORAL | Status: AC
Start: 2022-06-22 — End: 2022-06-22
  Administered 2022-06-22: 25 mg via ORAL
  Filled 2022-06-22: qty 1

## 2022-06-22 NOTE — Inpatient Diabetes Management (Signed)
Inpatient Diabetes Program Recommendations  AACE/ADA: New Consensus Statement on Inpatient Glycemic Control  Target Ranges:  Prepandial:   less than 140 mg/dL      Peak postprandial:   less than 180 mg/dL (1-2 hours)      Critically ill patients:  140 - 180 mg/dL     Latest Reference Range & Units 06/21/22 21:18  Glucose-Capillary 70 - 99 mg/dL 956 (H)    Latest Reference Range & Units 06/21/22 16:15  Glucose 70 - 99 mg/dL 387 (H)   Review of Glycemic Control  Diabetes history: DM2 Outpatient Diabetes medications: Glipizide 5 mg BID, Metformin 1000 mg BID Current orders for Inpatient glycemic control: Novolog 0-15 units TID with meals, Novolog 0-5 units QHS; Solumedrol 40 mg Q12H (stops 06/22/22 at 20:44), Prednisone 40 mg QAM (to start 06/23/22)  Inpatient Diabetes Program Recommendations:    Insulin: If steroids are continued as ordered, please consider ordering Semglee 14 units Q24H and Novolog 3 units TID with meals for meal coverage if patient eats at least 50% of meals.  Thanks, Orlando Penner, RN, MSN, CDCES Diabetes Coordinator Inpatient Diabetes Program [phone] (Team Pager from 8am to 5pm)

## 2022-06-22 NOTE — Hospital Course (Signed)
76 year old male with COPD with chronic respiratory failure 3 to 4 L nasal cannula at home, CAD status post CABG with ischemic cardiomyopathy and systolic/diastolic heart failure with ejection fraction 35 to 40%, aortic stenosis status post bioprosthetic aortic valve who presented to the emergency room on 7/6 with complaints of cough and shortness of breath and admitted for a COPD exacerbation. Patient was treated with steroids, no evidence of pneumonia. Condition has improved, patient back to baseline.  Due to increased glucose, I will decrease the prednisone dose to 20 mg daily and taper down.  Patient be followed with PCP in the near future

## 2022-06-22 NOTE — Assessment & Plan Note (Signed)
Meets criteria with BMI greater than 25 

## 2022-06-22 NOTE — Assessment & Plan Note (Signed)
Blood pressures have been slightly soft, limiting aggressiveness of diuresis.

## 2022-06-22 NOTE — Assessment & Plan Note (Signed)
Reportedly, most recent echo notes improvement in ejection fraction.

## 2022-06-22 NOTE — Assessment & Plan Note (Signed)
Patient states that he quit earlier this year

## 2022-06-22 NOTE — Progress Notes (Addendum)
[facility] Progress Note  Patient: Harold Parker    NFA:[id number]  DOA: 06/21/2022    Date of Service: the patient was seen and examined on 06/22/2022  Brief hospital course: 76 year old male with COPD with chronic respiratory failure 3 to 4 L nasal cannula at home, CAD status post CABG with ischemic cardiomyopathy and systolic/diastolic heart failure with ejection fraction 35 to 40%, aortic stenosis status post bioprosthetic aortic valve who presented to the emergency room on 7/6 with complaints of cough and shortness of breath and admitted for a COPD exacerbation.  Assessment and Plan: Assessment and Plan: * COPD with acute exacerbation (HCC) Schedule and as needed nebulized bronchodilator treatment IV steroids, antitussives, supplemental oxygen.  Slow improvement.  Acute on chronic respiratory failure with hypoxia (HCC) Initially presented with hypoxia on 5 L.  Down to 4.  Continue steroids and nebs.  Trying Lasix for blood pressure which tolerates.   CAD S/P CABG x 3 Ischemic cardiomyopathy Chronic combined heart failure (EF 35-40 with G2 DD 06/08/2022) Elevated BNP.  Patient has been off of his home medications.  Low-dose Lasix.   BPV (benign positional vertigo) Patient has been having a few episodes of vertigo prior to admission.  He said the lightheadedness he felt that made him come in was more just feeling weak.  However in the emergency room following admission, had a severe episode which in his words caused room spinning.  Trialing Antivert.  Type 2 diabetes mellitus with hyperglycemia, without long-term current use of insulin (HCC) Sliding scale insulin coverage  Ischemic cardiomyopathy Reportedly, most recent echo notes improvement in ejection fraction.  HTN (hypertension) Blood pressures have been slightly soft, limiting aggressiveness of diuresis.  Hyperlipidemia LDL goal <70 Patient has not been taking most of his home medications.  S/P aortic valve  replacement with bioprosthetic valve  No acute disused suspected  Overweight (BMI 25.0-29.9) Meets criteria with BMI greater than 25  Tobacco abuse Patient states that he quit earlier this year       Body mass index is 26.79 kg/m.     Consultants: None  Procedures: None  Antimicrobials: None  Code Status: Full code   Subjective: Breathing easier, complains of dizziness  Objective: Vital signs were reviewed and unremarkable.,  Soft blood pressures Vitals:   06/22/22 1438 06/22/22 1452  BP: 99/84 (!) 97/59  Pulse: 88 82  Resp: 20 19  Temp:  98.8 F (37.1 C)  SpO2: 94% 94%    Intake/Output Summary (Last 24 hours) at 06/22/2022 1539 Last data filed at 06/22/2022 0400 Gross per 24 hour  Intake --  Output 300 ml  Net -300 ml   Filed Weights   06/21/22 1516  Weight: 73 kg   Body mass index is 26.79 kg/m.  Exam:  General: Alert and oriented x3, no acute distress HEENT: Normocephalic atraumatic, mucous membranes are moist Cardiovascular: Regular rate and rhythm, S1-S2 Respiratory: Decreased breath sounds throughout Abdomen: Soft, nontender, nondistended, positive bowel sounds Musculoskeletal: No clubbing or cyanosis, trace pitting edema Skin: No skin breaks, tears or lesions Psychiatry: Appropriate, no evidence of psychoses Neurology: No focal deficits  Data Reviewed: A1c of 8.0, trending Blood sugars  Disposition:  Status is: Observation     Anticipated discharge date: 7/8  Remaining issues to be resolved so that patient can be discharged:  -Ambulation on 3 L nasal cannula without worsening hypoxia -Resolution of vertigo -Full diuresis   Family Communication: Left message for girlfriend DVT Prophylaxis: enoxaparin (LOVENOX) injection 40 mg Start: 06/21/22  2200    Author: Hollice Espy ,MD 06/22/2022 3:39 PM  To reach On-call, see care teams to locate the attending and reach out via [email or web address]. Between 7PM-7AM, please contact  night-coverage If you still have difficulty reaching the attending provider, please page the [facility] (Director on Call) for [facility] on amion for assistance.

## 2022-06-22 NOTE — Assessment & Plan Note (Signed)
Patient has been having a few episodes of vertigo prior to admission.  He said the lightheadedness he felt that made him come in was more just feeling weak.  However in the emergency room following admission, had a severe episode which in his words caused room spinning.  Trialing Antivert.

## 2022-06-22 NOTE — Assessment & Plan Note (Signed)
Patient has not been taking most of his home medications.

## 2022-06-22 NOTE — ED Notes (Signed)
Pt to be admitted to [id number], report given to [facility]. VSS at this time. Belongings sent with pt, pt transported via hospital bed.

## 2022-06-23 DIAGNOSIS — I272 Pulmonary hypertension, unspecified: Secondary | ICD-10-CM | POA: Diagnosis not present

## 2022-06-23 DIAGNOSIS — J9621 Acute and chronic respiratory failure with hypoxia: Secondary | ICD-10-CM | POA: Diagnosis not present

## 2022-06-23 DIAGNOSIS — I5022 Chronic systolic (congestive) heart failure: Secondary | ICD-10-CM

## 2022-06-23 DIAGNOSIS — J441 Chronic obstructive pulmonary disease with (acute) exacerbation: Secondary | ICD-10-CM | POA: Diagnosis not present

## 2022-06-23 LAB — BASIC METABOLIC PANEL
Anion gap: 5 (ref 5–15)
BUN: 71 mg/dL — ABNORMAL HIGH (ref 8–23)
CO2: 25 mmol/L (ref 22–32)
Calcium: 9.5 mg/dL (ref 8.9–10.3)
Chloride: 107 mmol/L (ref 98–111)
Creatinine, Ser: 1.55 mg/dL — ABNORMAL HIGH (ref 0.61–1.24)
GFR, Estimated: 46 mL/min — ABNORMAL LOW (ref >60)
Glucose, Bld: 191 mg/dL — ABNORMAL HIGH (ref 70–99)
Potassium: 5.3 mmol/L — ABNORMAL HIGH (ref 3.5–5.1)
Sodium: 137 mmol/L (ref 135–145)

## 2022-06-23 LAB — CBC
HCT: 44.7 % (ref 39.0–52.0)
Hemoglobin: 15 g/dL (ref 13.0–17.0)
MCH: 29.1 pg (ref 26.0–34.0)
MCHC: 33.6 g/dL (ref 30.0–36.0)
MCV: 86.8 fL (ref 80.0–100.0)
Platelets: 198 10*3/uL (ref 150–400)
RBC: 5.15 MIL/uL (ref 4.22–5.81)
RDW: 14.6 % (ref 11.5–15.5)
WBC: 14.5 10*3/uL — ABNORMAL HIGH (ref 4.0–10.5)
nRBC: 0 % (ref 0.0–0.2)

## 2022-06-23 LAB — GLUCOSE, CAPILLARY
Glucose-Capillary: 155 mg/dL — ABNORMAL HIGH (ref 70–99)
Glucose-Capillary: 175 mg/dL — ABNORMAL HIGH (ref 70–99)
Glucose-Capillary: 297 mg/dL — ABNORMAL HIGH (ref 70–99)
Glucose-Capillary: 218 mg/dL — ABNORMAL HIGH (ref 70–99)

## 2022-06-23 MED ORDER — SACUBITRIL-VALSARTAN 97-103 MG PO TABS: 1.0000 | ORAL_TABLET | Freq: Two times a day (BID) | ORAL | Status: DC

## 2022-06-23 MED ORDER — SODIUM ZIRCONIUM CYCLOSILICATE 10 G PO PACK
10.0000 g | PACK | Freq: Once | ORAL | Status: AC
  Administered 2022-06-23: 10 g via ORAL
  Filled 2022-06-23: qty 1

## 2022-06-23 NOTE — Care Management CC44 (Signed)
Condition Code 44 Documentation Completed  Patient Details  Name: Harold Parker MRN: [id number] Date of Birth: 1946/10/20   Condition Code 44 given:  Yes Patient signature on Condition Code 44 notice:  Yes Documentation of 2 MD's agreement:  Yes Code 44 added to claim:  Yes  Given to patient at bedside.   Harold Parker E Nahun Kronberg, LCSW 06/23/2022, 1:08 PM

## 2022-06-23 NOTE — Progress Notes (Signed)
  Progress Note   Patient: Harold Parker DQQ:[id number] DOB: Jul 07, 1946 DOA: 06/21/2022     1 DOS: the patient was seen and examined on 06/23/2022   Brief hospital course: 76 year old male with COPD with chronic respiratory failure 3 to 4 L nasal cannula at home, CAD status post CABG with ischemic cardiomyopathy and systolic/diastolic heart failure with ejection fraction 35 to 40%, aortic stenosis status post bioprosthetic aortic valve who presented to the emergency room on 7/6 with complaints of cough and shortness of breath and admitted for a COPD exacerbation.  Assessment and Plan: Acute on chronic respiratory failure with hypoxemia secondary to COPD exacerbation. COPD exacerbation. Patient had increased shortness of breath at time of admission, requiring more oxygen.  Consistent with acute on chronic hypoxemic respiratory failure.  This is caused by COPD exacerbation. Patient feels better today, continue oral steroids.  Chronic combined systolic and diastolic congestive heart failure. Coronary artery disease status post CABG. Severe pulmonary hypertension. Reviewed echocardiogram performed in 05/2022, ejection fraction 35 to 40%, right ventricular systolic blood pressure 74.9, consistent with severe pulm hypertension. Patient also has some elevation of BNP, but clinically, he does not seem to be volume overloaded.  He is on 20 mg of Lasix twice a day, dose appear appropriate.  We will continue current treatment.  Acute kidney injury. Hyperkalemia. Patient will be given a dose of Lokelma, recheck renal function tomorrow.  Due to slightly worsening renal function, will hold off Entresto for now.  Type 2 diabetes. Continue sliding scale insulin.     Subjective:  Patient feels better with shortness of breath, did not have paroxysmal nocturnal dyspnea.   Physical Exam: Vitals:   06/23/22 0239 06/23/22 0416 06/23/22 0746 06/23/22 1152  BP:  (!) 115/51 122/69 (!) 96/51  Pulse:  81 86 76   Resp:  20 19 18   Temp:  97.9 F (36.6 C) 98.2 F (36.8 C) 97.8 F (36.6 C)  TempSrc:  Oral Oral   SpO2: 94% 94% 96% 94%  Weight:      Height:       General exam: Appears calm and comfortable  Respiratory system: Decreased breathing sounds without crackles. Respiratory effort normal. Cardiovascular system: S1 & S2 heard, RRR. No JVD, murmurs, rubs, gallops or clicks. No pedal edema. Gastrointestinal system: Abdomen is nondistended, soft and nontender. No organomegaly or masses felt. Normal bowel sounds heard. Central nervous system: Alert and oriented. No focal neurological deficits. Extremities: Symmetric 5 x 5 power. Skin: No rashes, lesions or ulcers Psychiatry: Judgement and insight appear normal. Mood & affect appropriate.   Data Reviewed:  Reviewed previous echocardiogram results, chest x-ray, lab results.  Family Communication:   Disposition: Status is: Observation   Planned Discharge Destination: Home with Home Health    Time spent: 50 minutes  Author: , MD 06/23/2022 12:01 PM  For on call review [email or web address].

## 2022-06-23 NOTE — Plan of Care (Signed)
  Problem: Education: Goal: Knowledge of disease or condition will improve Outcome: Progressing Goal: Knowledge of the prescribed therapeutic regimen will improve Outcome: Progressing Goal: Individualized Educational Video(s) Outcome: Progressing   Problem: Activity: Goal: Ability to tolerate increased activity will improve Outcome: Progressing Goal: Will verbalize the importance of balancing activity with adequate rest periods Outcome: Progressing   Problem: Respiratory: Goal: Ability to maintain a clear airway will improve Outcome: Progressing Goal: Levels of oxygenation will improve Outcome: Progressing Goal: Ability to maintain adequate ventilation will improve Outcome: Progressing   Problem: Clinical Measurements: Goal: Ability to maintain clinical measurements within normal limits will improve Outcome: Progressing Goal: Will remain free from infection Outcome: Progressing Goal: Diagnostic test results will improve Outcome: Progressing Goal: Respiratory complications will improve Outcome: Progressing Goal: Cardiovascular complication will be avoided Outcome: Progressing   

## 2022-06-24 DIAGNOSIS — J9621 Acute and chronic respiratory failure with hypoxia: Secondary | ICD-10-CM | POA: Diagnosis not present

## 2022-06-24 DIAGNOSIS — J441 Chronic obstructive pulmonary disease with (acute) exacerbation: Secondary | ICD-10-CM | POA: Diagnosis not present

## 2022-06-24 DIAGNOSIS — I272 Pulmonary hypertension, unspecified: Secondary | ICD-10-CM | POA: Diagnosis not present

## 2022-06-24 DIAGNOSIS — I5022 Chronic systolic (congestive) heart failure: Secondary | ICD-10-CM | POA: Diagnosis not present

## 2022-06-24 LAB — CBC
HCT: 47.9 % (ref 39.0–52.0)
Hemoglobin: 15.9 g/dL (ref 13.0–17.0)
MCH: 28.8 pg (ref 26.0–34.0)
MCHC: 33.2 g/dL (ref 30.0–36.0)
MCV: 86.8 fL (ref 80.0–100.0)
Platelets: 203 10*3/uL (ref 150–400)
RBC: 5.52 MIL/uL (ref 4.22–5.81)
RDW: 14.8 % (ref 11.5–15.5)
WBC: 11.4 10*3/uL — ABNORMAL HIGH (ref 4.0–10.5)
nRBC: 0 % (ref 0.0–0.2)

## 2022-06-24 LAB — BASIC METABOLIC PANEL
Anion gap: 8 (ref 5–15)
BUN: 72 mg/dL — ABNORMAL HIGH (ref 8–23)
CO2: 25 mmol/L (ref 22–32)
Calcium: 9.2 mg/dL (ref 8.9–10.3)
Chloride: 106 mmol/L (ref 98–111)
Creatinine, Ser: 1.36 mg/dL — ABNORMAL HIGH (ref 0.61–1.24)
GFR, Estimated: 54 mL/min — ABNORMAL LOW (ref >60)
Glucose, Bld: 186 mg/dL — ABNORMAL HIGH (ref 70–99)
Potassium: 4.5 mmol/L (ref 3.5–5.1)
Sodium: 139 mmol/L (ref 135–145)

## 2022-06-24 LAB — GLUCOSE, CAPILLARY: Glucose-Capillary: 147 mg/dL — ABNORMAL HIGH (ref 70–99)

## 2022-06-24 MED ORDER — GUAIFENESIN-DM 100-10 MG/5ML PO SYRP: 5.0000 mL | ORAL_SOLUTION | ORAL | Status: DC | PRN

## 2022-06-24 MED ORDER — PREDNISONE 10 MG PO TABS: ORAL_TABLET | ORAL | 0 refills | Status: AC

## 2022-06-24 MED ORDER — MECLIZINE HCL 25 MG PO TABS
25.0000 mg | ORAL_TABLET | Freq: Three times a day (TID) | ORAL | 0 refills | Status: AC | PRN
Start: 2022-06-24 — End: ?

## 2022-06-24 NOTE — Progress Notes (Signed)
Patient taken off the floor via wheelchair by staff, with 02 at 3L. No distress noted c/o pain.

## 2022-06-24 NOTE — Discharge Summary (Signed)
Physician Discharge Summary   Patient: Harold Parker MRN: [id number] DOB: 1946-08-30  Admit date:     06/21/2022  Discharge date: 06/24/22  Discharge Physician: Sharen Hones   PCP: Lajean Manes, MD   Recommendations at discharge:   Follow-up with PCP in 1 week. Follow-up with cardiology as scheduled.  Discharge Diagnoses: Principal Problem:   COPD with acute exacerbation ([place]) Active Problems:   Acute on chronic respiratory failure with hypoxia (HCC)   CAD S/P CABG x 3   BPV (benign positional vertigo)   Ischemic cardiomyopathy   Type 2 diabetes mellitus with hyperglycemia, without long-term current use of insulin (HCC)   HTN (hypertension)   Hyperlipidemia LDL goal <70   S/P aortic valve replacement with bioprosthetic valve    Overweight (BMI 25.0-29.9)   Tobacco abuse   Chronic systolic CHF (congestive heart failure) (HCC)   AKI (acute kidney injury) ([place])   Hyperkalemia   Severe pulmonary hypertension (HCC)  Resolved Problems:   * No resolved hospital problems. *  Hospital Course: 76 year old male with COPD with chronic respiratory failure 3 to 4 L nasal cannula at home, CAD status post CABG with ischemic cardiomyopathy and systolic/diastolic heart failure with ejection fraction 35 to 40%, aortic stenosis status post bioprosthetic aortic valve who presented to the emergency room on 7/6 with complaints of cough and shortness of breath and admitted for a COPD exacerbation. Patient was treated with steroids, no evidence of pneumonia. Condition has improved, patient back to baseline.  Due to increased glucose, I will decrease the prednisone dose to 20 mg daily and taper down.  Patient be followed with PCP in the near future  Assessment and Plan: Acute on chronic respiratory failure with hypoxemia secondary to COPD exacerbation. COPD exacerbation. Patient had increased shortness of breath at time of admission, requiring more oxygen.  Consistent with acute on chronic hypoxemic  respiratory failure.  This is caused by COPD exacerbation. Patient feels much better, back to baseline with oxygenation. Patient is advised to follow-up with PCP in 1 week, continue steroid taper starting at 20 mg now.   Chronic combined systolic and diastolic congestive heart failure. Coronary artery disease status post CABG. Severe pulmonary hypertension. Reviewed echocardiogram performed in 05/2022, ejection fraction 35 to 40%, right ventricular systolic blood pressure [id number], consistent with severe pulm hypertension. Patient also has some elevation of BNP, but clinically, he does not seem to be volume overloaded.  He is on 20 mg of Lasix twice a day, dose appear appropriate.    Acute kidney injury. Hyperkalemia. Patient will be given a dose of Lokelma, potassium has normalized.  Renal function is also better today after holding Entresto. Continue holding Entresto until seen by cardiology.   Type 2 diabetes. Hemoglobin A1c 8.0, will resume home treatment.  Follow-up with PCP as outpatient.  Patient has elevated glucose due to steroids, will quickly taper down steroids dose.       Consultants: None Procedures performed: None  Disposition: Home Diet recommendation:  Discharge Diet Orders (From admission, onward)     Start     Ordered   06/24/22 0000  Diet - low sodium heart healthy        06/24/22 1126           Cardiac diet DISCHARGE MEDICATION: Allergies as of 06/24/2022       Reactions   Atorvastatin Other (See Comments)   Leg aches and weakness Other reaction(s): Other (See Comments) Leg aches and weakness Other reaction(s): Other (See Comments) Leg  aches and weakness        Medication List     STOP taking these medications    atorvastatin 80 MG tablet Commonly known as: LIPITOR   ibuprofen 200 MG tablet Commonly known as: ADVIL   metoprolol tartrate 100 MG tablet Commonly known as: LOPRESSOR   potassium chloride 10 MEQ tablet Commonly known as:  KLOR-CON   sacubitril-valsartan 97-103 MG Commonly known as: ENTRESTO       TAKE these medications    acetaminophen 500 MG tablet Commonly known as: TYLENOL Take 1,000 mg by mouth daily as needed for moderate pain or headache.   ALPRAZolam 0.5 MG tablet Commonly known as: XANAX Take 0.5 mg by mouth 2 (two) times daily.   aspirin EC 81 MG tablet Take 81 mg by mouth daily.   budesonide 0.5 MG/2ML nebulizer solution Commonly known as: PULMICORT TAKE 2MLS BY NEBULIZATION TWICE DAILY USE AFTER PERFOROMIST   busPIRone 10 MG tablet Commonly known as: BUSPAR Take 10 mg by mouth 2 (two) times daily.   cholecalciferol 25 MCG (1000 UNIT) tablet Commonly known as: VITAMIN D3 Take 4,000 Units by mouth daily.   Compressor/Nebulizer Misc Use as directed   esomeprazole 40 MG capsule Commonly known as: NEXIUM Take 40 mg by mouth at bedtime.   ezetimibe 10 MG tablet Commonly known as: ZETIA Take 10 mg by mouth at bedtime.   Farxiga 10 MG Tabs tablet Generic drug: dapagliflozin propanediol Take 10 mg by mouth every morning.   formoterol 20 MCG/2ML nebulizer solution Commonly known as: PERFOROMIST TAKE 2 MLS BY MOUTH BY NEBULIZATION TWICE DAILY   furosemide 20 MG tablet Commonly known as: LASIX Take 1 tablet (20 mg total) by mouth daily. What changed: when to take this   glipiZIDE 5 MG tablet Commonly known as: GLUCOTROL Take 1 tablet (5 mg total) by mouth 2 (two) times daily before a meal. Take 5 mg in the morning and take a second 5 mg dose at night on Mon, Wed, and Fri What changed: additional instructions   guaiFENesin 600 MG 12 hr tablet Commonly known as: MUCINEX Take 1,200 mg by mouth every 12 (twelve) hours as needed.   meclizine 25 MG tablet Commonly known as: ANTIVERT Take 1 tablet (25 mg total) by mouth 3 (three) times daily as needed for dizziness.   metFORMIN 1000 MG tablet Commonly known as: GLUCOPHAGE TAKE 1 TABLET BY MOUTH TWICE DAILY WITH A MEAL    metoprolol succinate 50 MG 24 hr tablet Commonly known as: TOPROL-XL TAKE 1 TABLET BY MOUTH EVERY DAY   predniSONE 10 MG tablet Commonly known as: DELTASONE Take 2 tablets (20 mg total) by mouth daily for 3 days, THEN 1 tablet (10 mg total) daily for 3 days. Start taking on: June 24, 2022   ProAir HFA 108 (90 Base) MCG/ACT inhaler Generic drug: albuterol Inhale 1 puff into the lungs every 4 (four) hours as needed for wheezing or shortness of breath.   albuterol (2.5 MG/3ML) 0.083% nebulizer solution Commonly known as: PROVENTIL Take 3 mLs (2.5 mg total) by nebulization every 6 (six) hours as needed for wheezing or shortness of breath.   rosuvastatin 10 MG tablet Commonly known as: CRESTOR TAKE 1 TABLET BY MOUTH EVERY DAY   traZODone 50 MG tablet Commonly known as: DESYREL Take 50 mg by mouth at bedtime as needed.   Yupelri 175 MCG/3ML nebulizer solution Generic drug: revefenacin Take 3 mLs (175 mcg total) by nebulization daily.  Follow-up Information     Stoneking, Hal, MD Follow up in 1 week(s).   Specialty: Internal Medicine Contact information: [address] [place] [address] [phone]         Iran Ouch, MD .   Specialty: Cardiology Contact information: [address] [place] [address] [phone]                Discharge Exam: Ceasar Mons Weights   06/21/22 1516  Weight: 73 kg   General exam: Appears calm and comfortable  Respiratory system: Clear to auscultation. Respiratory effort normal. Cardiovascular system: S1 & S2 heard, RRR. No JVD, murmurs, rubs, gallops or clicks. No pedal edema. Gastrointestinal system: Abdomen is nondistended, soft and nontender. No organomegaly or masses felt. Normal bowel sounds heard. Central nervous system: Alert and oriented. No focal neurological deficits. Extremities: Symmetric 5 x 5 power. Skin: No rashes, lesions or ulcers Psychiatry: Judgement and  insight appear normal. Mood & affect appropriate.    Condition at discharge: good  The results of significant diagnostics from this hospitalization (including imaging, microbiology, ancillary and laboratory) are listed below for reference.   Imaging Studies: CT Angio Chest PE W/Cm &/Or Wo Cm  Result Date: 06/21/2022 CLINICAL DATA:  Worsening shortness of breath over past week. History of COPD. EXAM: CT ANGIOGRAPHY CHEST WITH CONTRAST TECHNIQUE: Multidetector CT imaging of the chest was performed using the standard protocol during bolus administration of intravenous contrast. Multiplanar CT image reconstructions and MIPs were obtained to evaluate the vascular anatomy. RADIATION DOSE REDUCTION: This exam was performed according to the departmental dose-optimization program which includes automated exposure control, adjustment of the mA and/or kV according to patient size and/or use of iterative reconstruction technique. CONTRAST:  77mL OMNIPAQUE IOHEXOL 350 MG/ML SOLN COMPARISON:  CT examination dated June 21, 2021 FINDINGS: Cardiovascular: Satisfactory opacification of the pulmonary arteries to the segmental level. No evidence of pulmonary embolism. Heart is mildly enlarged. No pericardial effusion. Coronary artery atherosclerotic calcifications. Prosthetic aortic valve. Aorta is normal in caliber with moderate atherosclerotic calcifications of aorta and branch vessels. Main pulmonary trunk is prominent measuring up to 3 cm secondary to pulmonary arterial hypertension. Mediastinum/Nodes: No enlarged mediastinal, hilar, or axillary lymph nodes. Thyroid gland, trachea, and esophagus demonstrate no significant findings. Lungs/Pleura: Moderate centrilobular emphysematous changes. Stable 5 mm nodule in the right upper lobe (series 5, image 43), likely benign process no evidence of pneumonia or pleural effusion. Upper Abdomen: Bilateral exophytic renal cysts. Large right lower pole renal cyst measuring at least 3.8  cm. Musculoskeletal: Sternotomy wires are intact. Mild thoracic kyphosis. No acute osseous abnormality. Review of the MIP images confirms the above findings. IMPRESSION: 1.  No evidence of pulmonary embolism. 2.  Moderate centrilobular emphysema. 3. Moderate atherosclerotic disease of aorta and branch vessels. Prosthetic aortic valve. 3. Main pulmonary trunk is prominent measuring up to 3 cm concerning for pulmonary arterial hypertension. 4. Large bilateral exophytic renal cysts, likely simple cysts, no follow-up is recommended. Aortic Atherosclerosis (ICD10-I70.0) and Emphysema (ICD10-J43.9). Electronically Signed   By: Larose Hires D.O.   On: 06/21/2022 23:30   DG Chest 2 View  Result Date: 06/21/2022 CLINICAL DATA:  Worsening shortness of breath for 1 week, COPD EXAM: CHEST - 2 VIEW COMPARISON:  12/23/2021 FINDINGS: Frontal and lateral views of the chest demonstrate postsurgical changes from CABG and aortic valve replacement. Cardiac silhouette is unremarkable. Chronic interstitial prominence consistent with scarring. No acute airspace disease, effusion, or pneumothorax. No acute bony  abnormalities. IMPRESSION: 1. No acute intrathoracic process. Electronically Signed   By: Randa Ngo M.D.   On: 06/21/2022 16:10   ECHOCARDIOGRAM COMPLETE  Result Date: 06/08/2022    ECHOCARDIOGRAM REPORT   Patient Name:   Harold Parker Date of Exam: 06/08/2022 Medical Rec #:  [id number]     Height:       65.0 in Accession #:    [id number]    Weight:       165.0 lb Date of Birth:  1946-10-06     BSA:          1.823 m Patient Age:    67 years      BP:           104/64 mmHg Patient Gender: M             HR:           87 bpm. Exam Location:  [place] Procedure: 2D Echo, Strain Analysis, Cardiac Doppler and Color Doppler Indications:    Ischemic cardiomyopathy [I25.5 (ICD-10-CM)]  History:        Patient has prior history of Echocardiogram examinations, most                 recent 11/01/2021. CAD, Prior Cardiac Surgery, COPD  and PAD;                 bioprosthetic aortic valve replacement in 02/2018 and Aortic                 Valve Disease.  Sonographer:    Luane School RDCS Referring Phys: [id number] [place]  Sonographer Comments: Suboptimal apical window. IMPRESSIONS  1. Left ventricular ejection fraction, by estimation, is 35 to 40%. The left ventricle has moderately decreased function. The left ventricle demonstrates global hypokinesis with severe apical hypokinesis and spontaneous contrast in the apical region.D shaped septum systole and diastole consistent with RV volume and pressure overload.  2. There is mild left ventricular hypertrophy. Left ventricular diastolic parameters are consistent with Grade II diastolic dysfunction (pseudonormalization). The average left ventricular global longitudinal strain is 6.2 %. The global longitudinal strain is abnormal.  3. Right ventricular systolic function is severely reduced. The right ventricular size is severely enlarged. There is severely elevated pulmonary artery systolic pressure. The estimated right ventricular systolic pressure is [id number] mmHg.  4. Left atrial size was mildly dilated.  5. Right atrial size was severely dilated.  6. The mitral valve is normal in structure. Mild to moderate mitral valve regurgitation. No evidence of mitral stenosis.  7. Tricuspid valve regurgitation is mild to moderate.  8. The aortic valve was not well visualized. Aortic valve regurgitation is not visualized. No aortic stenosis is present.  9. The inferior vena cava is dilated in size with >50% respiratory variability, suggesting right atrial pressure of 8 mmHg. FINDINGS  Left Ventricle: Left ventricular ejection fraction, by estimation, is 35 to 40%. The left ventricle has moderately decreased function. The left ventricle demonstrates global hypokinesis. Definity contrast agent was given IV to delineate the left ventricular endocardial borders. The average left ventricular global longitudinal strain is  6.2 %. The global longitudinal strain is abnormal. The left ventricular internal cavity size was normal in size. There is mild left ventricular hypertrophy. Left ventricular diastolic parameters are consistent with Grade II diastolic dysfunction (pseudonormalization). Right Ventricle: The right ventricular size is severely enlarged. No increase in right ventricular wall thickness. Right ventricular systolic function is severely reduced. There is severely elevated pulmonary  artery systolic pressure. The tricuspid regurgitant velocity is 4.09 m/s, and with an assumed right atrial pressure of 8 mmHg, the estimated right ventricular systolic pressure is [id number] mmHg. Left Atrium: Left atrial size was mildly dilated. Right Atrium: Right atrial size was severely dilated. Pericardium: There is no evidence of pericardial effusion. Mitral Valve: The mitral valve is normal in structure. Mild mitral annular calcification. Mild to moderate mitral valve regurgitation. No evidence of mitral valve stenosis. Tricuspid Valve: The tricuspid valve is normal in structure. Tricuspid valve regurgitation is mild to moderate. No evidence of tricuspid stenosis. Aortic Valve: The aortic valve was not well visualized. Aortic valve regurgitation is not visualized. No aortic stenosis is present. Aortic valve mean gradient measures 6.0 mmHg. Aortic valve peak gradient measures 11.4 mmHg. Aortic valve area, by VTI measures 1.36 cm. Pulmonic Valve: The pulmonic valve was normal in structure. Pulmonic valve regurgitation is not visualized. No evidence of pulmonic stenosis. Aorta: The aortic root is normal in size and structure. Ascending aorta measurements are within normal limits for age when indexed to body surface area. Venous: The inferior vena cava is dilated in size with greater than 50% respiratory variability, suggesting right atrial pressure of 8 mmHg. IAS/Shunts: No atrial level shunt detected by color flow Doppler.  LEFT VENTRICLE PLAX 2D  LVOT diam:     2.00 cm LV SV:         47        2D Longitudinal Strain LV SV Index:   26        2D Strain GLS Avg:     6.2 % LVOT Area:     3.14 cm  AORTIC VALVE AV Area (Vmax):    1.15 cm AV Area (Vmean):   1.21 cm AV Area (VTI):     1.36 cm AV Vmax:           169.00 cm/s AV Vmean:          117.000 cm/s AV VTI:            0.349 m AV Peak Grad:      11.4 mmHg AV Mean Grad:      6.0 mmHg LVOT Vmax:         61.60 cm/s LVOT Vmean:        45.100 cm/s LVOT VTI:          0.151 m LVOT/AV VTI ratio: 0.43 TRICUSPID VALVE TR Peak grad:   66.9 mmHg TR Vmax:        409.00 cm/s  SHUNTS Systemic VTI:  0.15 m Systemic Diam: 2.00 cm Ida Rogue MD Electronically signed by Ida Rogue MD Signature Date/Time: 06/08/2022/1:54:25 PM    Final     Microbiology: Results for orders placed or performed during the hospital encounter of 06/21/22  Resp Panel by RT-PCR (Flu A&B, Covid) Anterior Nasal Swab     Status: None   Collection Time: 06/21/22  3:25 PM   Specimen: Anterior Nasal Swab  Result Value Ref Range Status   SARS Coronavirus 2 by RT PCR NEGATIVE NEGATIVE Final    Comment: (NOTE) SARS-CoV-2 target nucleic acids are NOT DETECTED.  The SARS-CoV-2 RNA is generally detectable in upper respiratory specimens during the acute phase of infection. The lowest concentration of SARS-CoV-2 viral copies this assay can detect is 138 copies/mL. A negative result does not preclude SARS-Cov-2 infection and should not be used as the sole basis for treatment or other patient management decisions. A negative result may occur with  improper specimen collection/handling, submission of specimen other than nasopharyngeal swab, presence of viral mutation(s) within the areas targeted by this assay, and inadequate number of viral copies(<138 copies/mL). A negative result must be combined with clinical observations, patient history, and epidemiological information. The expected result is Negative.  Fact Sheet for Patients:   BloggerCourse.com  Fact Sheet for Healthcare Providers:  SeriousBroker.it  This test is no t yet approved or cleared by the [place] FDA and  has been authorized for detection and/or diagnosis of SARS-CoV-2 by FDA under an Emergency Use Authorization (EUA). This EUA will remain  in effect (meaning this test can be used) for the duration of the COVID-19 declaration under Section 564(b)(1) of the Act, 21 U.S.C.section 360bbb-3(b)(1), unless the authorization is terminated  or revoked sooner.       Influenza A by PCR NEGATIVE NEGATIVE Final   Influenza B by PCR NEGATIVE NEGATIVE Final    Comment: (NOTE) The Xpert Xpress SARS-CoV-2/FLU/RSV plus assay is intended as an aid in the diagnosis of influenza from Nasopharyngeal swab specimens and should not be used as a sole basis for treatment. Nasal washings and aspirates are unacceptable for Xpert Xpress SARS-CoV-2/FLU/RSV testing.  Fact Sheet for Patients: BloggerCourse.com  Fact Sheet for Healthcare Providers: SeriousBroker.it  This test is not yet approved or cleared by the [place] FDA and has been authorized for detection and/or diagnosis of SARS-CoV-2 by FDA under an Emergency Use Authorization (EUA). This EUA will remain in effect (meaning this test can be used) for the duration of the COVID-19 declaration under Section 564(b)(1) of the Act, 21 U.S.C. section 360bbb-3(b)(1), unless the authorization is terminated or revoked.  Performed at [facility], [address] [place], [place] [address]     Labs: CBC: Recent Labs  Lab 06/21/22 1521 06/23/22 0458 06/24/22 0504  WBC 10.0 14.5* 11.4*  NEUTROABS 6.5  --   --   HGB 17.2* 15.0 15.9  HCT 50.6 44.7 47.9  MCV 85.2 86.8 86.8  PLT 193 198 203   Basic Metabolic Panel: Recent Labs  Lab 06/18/22 1231 06/21/22 1615 06/23/22 0458 06/24/22 0504   NA 139 135 137 139  K 4.7 4.9 5.3* 4.5  CL 105 103 107 106  CO2 27 23 25 25   GLUCOSE 153* 131* 191* 186*  BUN 42* 48* 71* 72*  CREATININE 1.25* 1.13 1.55* 1.36*  CALCIUM 9.4 9.8 9.5 9.2   Liver Function Tests: No results for input(s): "AST", "ALT", "ALKPHOS", "BILITOT", "PROT", "ALBUMIN" in the last 168 hours. CBG: Recent Labs  Lab 06/23/22 0827 06/23/22 1148 06/23/22 1558 06/23/22 2124 06/24/22 0832  GLUCAP 155* 175* 297* 218* 147*    Discharge time spent: greater than 30 minutes.  Signed: 08/25/22, MD [facility] 06/24/2022

## 2022-06-24 NOTE — Progress Notes (Signed)
Harold Parker to be D/C'd Home per MD order.  Discussed prescriptions and follow up appointments with the patient. Prescriptions electronically submitted, medication list explained in detail. Pt verbalized understanding.  Allergies as of 06/24/2022       Reactions   Atorvastatin Other (See Comments)   Leg aches and weakness Other reaction(s): Other (See Comments) Leg aches and weakness Other reaction(s): Other (See Comments) Leg aches and weakness        Medication List     STOP taking these medications    atorvastatin 80 MG tablet Commonly known as: LIPITOR   ibuprofen 200 MG tablet Commonly known as: ADVIL   metoprolol tartrate 100 MG tablet Commonly known as: LOPRESSOR   potassium chloride 10 MEQ tablet Commonly known as: KLOR-CON   sacubitril-valsartan 97-103 MG Commonly known as: ENTRESTO       TAKE these medications    acetaminophen 500 MG tablet Commonly known as: TYLENOL Take 1,000 mg by mouth daily as needed for moderate pain or headache.   ALPRAZolam 0.5 MG tablet Commonly known as: XANAX Take 0.5 mg by mouth 2 (two) times daily.   aspirin EC 81 MG tablet Take 81 mg by mouth daily.   budesonide 0.5 MG/2ML nebulizer solution Commonly known as: PULMICORT TAKE BY NEBULIZATION TWICE DAILY USE AFTER PERFOROMIST   busPIRone 10 MG tablet Commonly known as: BUSPAR Take 10 mg by mouth 2 (two) times daily.   cholecalciferol 25 MCG (1000 UNIT) tablet Commonly known as: VITAMIN D3 Take 4,000 Units by mouth daily.   Compressor/Nebulizer Misc Use as directed   esomeprazole 40 MG capsule Commonly known as: NEXIUM Take 40 mg by mouth at bedtime.   ezetimibe 10 MG tablet Commonly known as: ZETIA Take 10 mg by mouth at bedtime.   Farxiga 10 MG Tabs tablet Generic drug: dapagliflozin propanediol Take 10 mg by mouth every morning.   formoterol 20 MCG/2ML nebulizer solution Commonly known as: PERFOROMIST TAKE 2 MLS BY MOUTH BY NEBULIZATION TWICE  DAILY   furosemide 20 MG tablet Commonly known as: LASIX Take 1 tablet (20 mg total) by mouth daily. What changed: when to take this   glipiZIDE 5 MG tablet Commonly known as: GLUCOTROL Take 1 tablet (5 mg total) by mouth 2 (two) times daily before a meal. Take 5 mg in the morning and take a second 5 mg dose at night on Mon, Wed, and Fri What changed: additional instructions   guaiFENesin 600 MG 12 hr tablet Commonly known as: MUCINEX Take 1,200 mg by mouth every 12 (twelve) hours as needed.   meclizine 25 MG tablet Commonly known as: ANTIVERT Take 1 tablet (25 mg total) by mouth 3 (three) times daily as needed for dizziness.   metFORMIN 1000 MG tablet Commonly known as: GLUCOPHAGE TAKE 1 TABLET BY MOUTH TWICE DAILY WITH A MEAL   metoprolol succinate 50 MG 24 hr tablet Commonly known as: TOPROL-XL TAKE 1 TABLET BY MOUTH EVERY DAY   predniSONE 10 MG tablet Commonly known as: DELTASONE Take 2 tablets (20 mg total) by mouth daily for 3 days, THEN 1 tablet (10 mg total) daily for 3 days. Start taking on: June 24, 2022   ProAir HFA 108 (90 Base) MCG/ACT inhaler Generic drug: albuterol Inhale 1 puff into the lungs every 4 (four) hours as needed for wheezing or shortness of breath.   albuterol (2.5 MG/3ML) 0.083% nebulizer solution Commonly known as: PROVENTIL Take 3 mLs (2.5 mg total) by nebulization every 6 (six) hours as  needed for wheezing or shortness of breath.   rosuvastatin 10 MG tablet Commonly known as: CRESTOR TAKE 1 TABLET BY MOUTH EVERY DAY   traZODone 50 MG tablet Commonly known as: DESYREL Take 50 mg by mouth at bedtime as needed.   Yupelri 175 MCG/3ML nebulizer solution Generic drug: revefenacin Take 3 mLs (175 mcg total) by nebulization daily.        Vitals:   06/24/22 0422 06/24/22 0823  BP: 134/72 140/66  Pulse: 79 81  Resp: 20 (!) 21  Temp: 98.4 F (36.9 C) 97.7 F (36.5 C)  SpO2: 96% 97%    Skin clean, dry and intact without evidence of  skin break down, no evidence of skin tears noted. IV catheter discontinued intact. Site without signs and symptoms of complications. Dressing and pressure applied. Pt denies pain at this time. No complaints noted.  An After Visit Summary was printed and given to the patient. Patient  waiting on ride to be discharged home  Harold Parker

## 2022-07-02 NOTE — Progress Notes (Signed)
Cardiology Office Note    Date:  07/05/2022   ID:  Harold Parker, DOB 1946-09-17, MRN [id number]  PCP:  Merlene Laughter, MD  Cardiologist:  [name], MD  Electrophysiologist:  None   Chief Complaint: Follow-up  History of Present Illness:   Harold Parker is a 76 y.o. male with history of CAD status post CABG and bioprosthetic aortic valve replacement in 02/2018, HFrEF, pulmonary hypertension, PAD, DM2, HTN, HLD, GI bleed, and chronic hypoxic respiratory failure on supplemental oxygen, COVID, COPD with prior tobacco use who presents for follow-up of his CAD and cardiomyopathy.   Harold Parker was admitted in 01/2018 with an NSTEMI.  LHC revealed severe left main stenosis in addition to significant disease involving the RCA and OM1.  EF was 40 to 45% with moderate aortic stenosis.  Harold Parker subsequently underwent CABG and aortic valve replacement.  Postoperative course was notable for A-fib but that was controlled with amiodarone.  Harold Parker was readmitted with an upper GI bleed with a hemoglobin of 4.7 with EGD demonstrating duodenal ulcers.     With regards to his PAD, lower extremity angiography in 02/2019, in the context of bilateral lower extremity claudication, showed no significant aortoiliac disease.  On the right, there was a long heavily calcified occlusion of the SFA with well-developed collaterals from the profunda and three-vessel runoff below the knee.  On the left, there was heavily calcified disease affecting the left SFA with three-vessel runoff below the knee.  Harold Parker underwent successful orbital atherectomy and drug-coated balloon angioplasty to the left SFA.  Most recent noninvasive lower extremity imaging showed an improved ABI on the left with a patent left SFA.     Harold Parker was admitted in 01/2021 with COVID-pneumonia and treated with steroids and remdesivir.  His oxygen requirement increased to 4 L.  Harold Parker was readmitted later that same month with continued shortness of breath and again required treatment  with steroids.  His oxygen requirement could not be decreased below 4 L and this was established as his new baseline following COVID infection.  Echo performed during admission showed an EF of 40 to 45%, moderate pulmonary hypertension with a PASP of 58 mmHg, and mild mitral regurgitation.  Harold Parker was admitted in 10/2021 with recurrent COVID-pneumonia complicated by acute on chronic respiratory failure and AKI with hyperkalemia.  Repeat echo showed an EF of 35 to 40% with moderately enlarged right ventricle and a normal functioning bioprosthetic aortic valve.  Work-up for DVT and PE was negative.  Harold Parker was admitted in 12/2021 with acute on chronic hypoxic respiratory failure secondary to COPD and heart failure.  Harold Parker was diuresed and given breathing treatments with improvement in symptoms.  Harold Parker was seen in the office in 02/2022 and felt less short of breath and continued to require supplemental oxygen.  Harold Parker was without symptoms of angina, decompensation, or claudication.  By our scale, his weight was down 3 pounds when compared to his visit in 11/2021.  Losartan was transitioned to [name].  Based on labs at that time, there was concern for volume depletion with his furosemide being decreased back to 40 mg daily.  Harold Parker was seen in the office on 04/02/2022 after having had his Lasix titrated to 40 mg twice daily alternating with 40 mg daily due to lower extremity swelling and noted an improvement in symptoms with this, along with the addition of compression stockings.  Swelling was felt to possibly be in the setting of saltier foods consumed over the Easter holiday.  His weight  was down 2 pounds by our scale when compared to his prior clinic visit.  In an effort to optimize GDMT, amlodipine was discontinued and Entresto was titrated to 97/103 mg twice daily.  Harold Parker was last seen in the office on 05/17/2022 and was doing reasonably well from a cardiac perspective.  Harold Parker did note some chronic mild musculoskeletal anterior chest wall pain  that was worse over the preceding weeks and reproducible to palpation.  Harold Parker noted a prior prescription of prednisone as well as prior NSAID use helped with this discomfort.  Harold Parker was started on Farxiga with continuation of Toprol-XL and Entresto.  Subsequent Echo on 06/08/2022, to evaluate his cardiomyopathy on maximally tolerated GDMT, demonstrated an EF of 35 to 40%, global hypokinesis with severe apical hypokinesis with spontaneous contrast in the apical region, findings consistent with volume overload, mild LVH, grade 2 diastolic dysfunction, severely reduced RV systolic function with severely enlarged RV cavity size, PASP 74.9 mmHg, mildly dilated left atrium, severely dilated right atrium, mild to moderate mitral regurgitation, mild to moderate tricuspid regurgitation, and an estimated right atrial pressure of 8 mmHg.  With these findings, after discussion with his primary cardiologist, furosemide was titrated, however with this Harold Parker developed some AKI needing furosemide to be de-escalated to 20 mg twice daily.  Harold Parker was admitted to the hospital earlier this month with acute on chronic hypoxic respiratory failure felt to be more related to COPD exacerbation over CHF.  During the admission, initial and peak high-sensitivity troponin of 19 with a delta troponin of 18. BNP 829.  Chest x-ray showed no acute intrathoracic process.  CTA of the chest was negative for PE with moderate central lobar emphysema, aortic atherosclerosis, and dilated main pulmonary trunk consistent with pulmonary hypertension.  [name] was held at time of discharge.  Harold Parker comes in today, continuing to note intermittent progressive dyspnea as well as chest tightness.  Harold Parker did note the previously prescribed prednisone helped with this chest tightness as well as his breathing.  His weight is up 9 pounds today when compared to his last clinic visit.  Harold Parker does have some increase in lower extremity swelling, though Harold Parker recently drove down from  [place], [place].  Harold Parker plans to put on compression stockings later today.  Harold Parker has stable two-pillow orthopnea.  Harold Parker is watching his fluid and salt intake.  No longer on Farxiga secondary to AKI.  Harold Parker remains on supplemental oxygen and has pulmonary follow-up next week.   Labs independently reviewed: 06/2022 - Hgb 15.9, PLT 203, potassium 4.5, BUN 72, serum creatinine 1.36, A1c 8.0 10/2021 - magnesium 1.8, A1c 6.8, albumin 3.9, AST normal, ALT 58 01/2021 - TSH normal 12/2020 - TC 90, TG 87, HDL 28, LDL 45  Past Medical History:  Diagnosis Date   Bilateral carotid bruits    a. 01/2018 U/S: < 50% bilat ICA stenosis.   CAD (coronary artery disease)    a. 1998 s/p MI and BMS [place], [place]); b. 1999 redo PCI/rotablator in setting of what sounds like ISR;  c. Multiple stress tests over the years - last ~ 2017, reportedly nl; d. 01/2018 NSTEMI/Cath: LM 43m/d, LAD 50p, 40p/m, D1 60ost, OM1 95, RCA 100ost/p w/ L->R collats, EF 45%; e. s/p 3V CABG 02/19/18 (LIMA-LAD, VG-D1, VG-OM)   CHF (congestive heart failure) (HCC)    Chronic lower back pain    COPD (chronic obstructive pulmonary disease) ([place])    GIB (gastrointestinal bleeding)    a. 02/2018 GIB and anemia w/ Hgb of 4.7  on presentation; b. 03/2018 EGD: 2 nonbleeding duodenal ulcers.   HTN (hypertension)    Hypercholesteremia    Ischemic cardiomyopathy    a. 01/2018 Echo: EF 40-45%, mid-apicalanteroseptal, ant, apical sev HK, mod apicalinferior HK. Gr2 DD. Mod AS, mild MR, mod dil LA, PASP 79mmHg.   Moderate aortic stenosis    a. 01/2018 Echo: Mod AS, mean grad (S) 48mmHg, Valve area (VTI) 1.06 cm^2, (Vmax) 1.27 cm^2; b. 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   Myocardial infarction [facility]) ~ 1998/1999   S/P aortic valve replacement with bioprosthetic valve 02/19/2018   a. 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   LIMA to LAD, SVG to D1, SVG to OM, EVH via right thigh and leg   Tobacco abuse     Type II diabetes mellitus ([place])     Past Surgical History:  Procedure Laterality Date   ABDOMINAL AORTOGRAM N/A 03/04/2019   Procedure: ABDOMINAL AORTOGRAM;  Surgeon: Wellington Hampshire, MD;  Location: [place] [facility];  Service: Cardiovascular;  Laterality: N/A;   AORTIC VALVE REPLACEMENT N/A 02/19/2018   Procedure: AORTIC VALVE REPLACEMENT (AVR);  Surgeon: Rexene Alberts, MD;  Location: [place];  Service: Open Heart Surgery;  Laterality: N/A;   COLONOSCOPY     CORONARY ANGIOPLASTY WITH STENT PLACEMENT  ~ 1998/1999   CORONARY ARTERY BYPASS GRAFT N/A 02/19/2018   Procedure: CORONARY ARTERY BYPASS GRAFTING (CABG) x three , using left internal mammary artery and right leg greater saphenous vein harvested endoscopically;  Surgeon: Rexene Alberts, MD;  Location: [place];  Service: Open Heart Surgery;  Laterality: N/A;   ESOPHAGOGASTRODUODENOSCOPY (EGD) WITH PROPOFOL N/A 03/18/2018   Procedure: ESOPHAGOGASTRODUODENOSCOPY (EGD) WITH PROPOFOL;  Surgeon: Lucilla Lame, MD;  Location: [facility];  Service: Endoscopy;  Laterality: N/A;   LEFT HEART CATH AND CORONARY ANGIOGRAPHY N/A 02/06/2018   Procedure: LEFT HEART CATH AND CORONARY ANGIOGRAPHY;  Surgeon: Wellington Hampshire, MD;  Location: [place] [facility];  Service: Cardiovascular;  Laterality: N/A;   LOWER EXTREMITY ANGIOGRAPHY Bilateral 03/04/2019   Procedure: Lower Extremity Angiography;  Surgeon: Wellington Hampshire, MD;  Location: [place] [facility];  Service: Cardiovascular;  Laterality: Bilateral;   PERIPHERAL VASCULAR ATHERECTOMY Left 03/04/2019   Procedure: PERIPHERAL VASCULAR ATHERECTOMY;  Surgeon: Wellington Hampshire, MD;  Location: [place] [facility];  Service: Cardiovascular;  Laterality: Left;  SFA   TEE WITHOUT CARDIOVERSION N/A 02/19/2018   Procedure: TRANSESOPHAGEAL ECHOCARDIOGRAM (TEE);  Surgeon: Rexene Alberts, MD;  Location: [place];  Service: Open Heart Surgery;  Laterality: N/A;   TONSILLECTOMY      Current Medications: Current Meds   Medication Sig   acetaminophen (TYLENOL) 500 MG tablet Take 1,000 mg by mouth daily as needed for moderate pain or headache.   albuterol (PROVENTIL) (2.5 MG/3ML) 0.083% nebulizer solution Take 3 mLs (2.5 mg total) by nebulization every 6 (six) hours as needed for wheezing or shortness of breath.   ALPRAZolam (XANAX) 0.5 MG tablet Take 0.5 mg by mouth 2 (two) times daily.   aspirin EC 81 MG tablet Take 81 mg by mouth daily.   budesonide (PULMICORT) 0.5 MG/2ML nebulizer solution TAKE 2MLS BY NEBULIZATION TWICE DAILY USE AFTER PERFOROMIST   busPIRone (BUSPAR) 10 MG tablet Take 10 mg by mouth 2 (two) times daily.   cholecalciferol (VITAMIN D3) 25 MCG (1000 UNIT) tablet Take 4,000 Units by mouth daily.   esomeprazole (NEXIUM) 40 MG capsule Take 40 mg  by mouth at bedtime.    ezetimibe (ZETIA) 10 MG tablet Take 10 mg by mouth at bedtime.    formoterol (PERFOROMIST) 20 MCG/2ML nebulizer solution TAKE 2 MLS BY MOUTH BY NEBULIZATION TWICE DAILY   furosemide (LASIX) 20 MG tablet Take 1 tablet (20 mg total) by mouth daily.   glipiZIDE (GLUCOTROL) 5 MG tablet Take 1 tablet (5 mg total) by mouth 2 (two) times daily before a meal. Take 5 mg in the morning and take a second 5 mg dose at night on Mon, Wed, and Fri (Patient taking differently: Take 5 mg by mouth 2 (two) times daily before a meal.)   guaiFENesin (MUCINEX) 600 MG 12 hr tablet Take 1,200 mg by mouth every 12 (twelve) hours as needed.   meclizine (ANTIVERT) 25 MG tablet Take 1 tablet (25 mg total) by mouth 3 (three) times daily as needed for dizziness.   metFORMIN (GLUCOPHAGE) 1000 MG tablet TAKE 1 TABLET BY MOUTH TWICE DAILY WITH A MEAL   metoprolol succinate (TOPROL-XL) 50 MG 24 hr tablet TAKE 1 TABLET BY MOUTH EVERY DAY   Nebulizers (COMPRESSOR/NEBULIZER) MISC Use as directed   PROAIR HFA 108 (90 Base) MCG/ACT inhaler Inhale 1 puff into the lungs every 4 (four) hours as needed for wheezing or shortness of breath.   revefenacin (YUPELRI) 175  MCG/3ML nebulizer solution Take 3 mLs (175 mcg total) by nebulization daily.   rosuvastatin (CRESTOR) 10 MG tablet TAKE 1 TABLET BY MOUTH EVERY DAY   traZODone (DESYREL) 50 MG tablet Take 50 mg by mouth at bedtime as needed.    Allergies:   Atorvastatin and Atorvastatin calcium   Social History   Socioeconomic History   Marital status: Widowed    Spouse name: Not on file   Number of children: Not on file   Years of education: Not on file   Highest education level: Not on file  Occupational History   Not on file  Tobacco Use   Smoking status: Former    Packs/day: 1.00    Years: 40.00    Total pack years: 40.00    Types: Cigarettes    Quit date: 02/02/2018    Years since quitting: 4.4   Smokeless tobacco: Never   Tobacco comments:    Quit 01/2018 - on 05/01/2018 talked to Sheng about [place] concerns. Harold Parker ststaes that Harold Parker has no taste for tobacco since Harold Parker quit in Feb.  Vaping Use   Vaping Use: Never used  Substance and Sexual Activity   Alcohol use: Never    Comment: occ   Drug use: No   Sexual activity: Yes  Other Topics Concern   Not on file  Social History Narrative   Lives in [place] by himself.  Retired Engineer, structural currently working as Patent examiner.  Fairly active but doesn't routinely exercise.   Social Determinants of Health   Financial Resource Strain: Not on file  Food Insecurity: Not on file  Transportation Needs: Not on file  Physical Activity: Not on file  Stress: Not on file  Social Connections: Not on file     Family History:  The patient's family history includes Lymphoma in his mother; Peripheral vascular disease in his father.  ROS:   12-point review of systems is negative unless otherwise noted in the HPI.   EKGs/Labs/Other Studies Reviewed:    Studies reviewed were summarized above. The additional studies were reviewed today:  2D echo 06/08/2022: 1. Left ventricular ejection fraction, by estimation, is 35 to  40%. The  left  ventricle has moderately decreased function. The left ventricle  demonstrates global hypokinesis with severe apical hypokinesis and  spontaneous contrast in the apical region.D  shaped septum systole and diastole consistent with RV volume and pressure  overload.   2. There is mild left ventricular hypertrophy. Left ventricular diastolic  parameters are consistent with Grade II diastolic dysfunction  (pseudonormalization). The average left ventricular global longitudinal  strain is 6.2 %. The global longitudinal  strain is abnormal.   3. Right ventricular systolic function is severely reduced. The right  ventricular size is severely enlarged. There is severely elevated  pulmonary artery systolic pressure. The estimated right ventricular  systolic pressure is 74.9 mmHg.   4. Left atrial size was mildly dilated.   5. Right atrial size was severely dilated.   6. The mitral valve is normal in structure. Mild to moderate mitral valve  regurgitation. No evidence of mitral stenosis.   7. Tricuspid valve regurgitation is mild to moderate.   8. The aortic valve was not well visualized. Aortic valve regurgitation  is not visualized. No aortic stenosis is present.   9. The inferior vena cava is dilated in size with >50% respiratory  variability, suggesting right atrial pressure of 8 mmHg. __________  ABIs 01/02/2022: ABI/TBIToday's ABIToday's TBIPrevious ABIPrevious TBI  +-------+-----------+-----------+------------+------------+  Right  0.82       0.34       0.73        0.36          +-------+-----------+-----------+------------+------------+  Left   0.84       0.38       0.74        not detected  +-------+-----------+-----------+------------+------------+  Bilateral ABIs appear increased compared to prior study on 09/20/20. Left  TBIs appear increased compared to prior study on 09/20/20.     Summary:  Right: Resting right ankle-brachial index indicates mild right lower   extremity arterial disease. The right toe-brachial index is abnormal.   Left: Resting left ankle-brachial index indicates mild left lower  extremity arterial disease. The left toe-brachial index is abnormal.     Suggest follow up study in 12 months.  __________   Arterial ultrasound 01/02/2022: Summary:  Left: 50-74% stenosis noted in the superficial femoral artery. Mild  progression is noted compared to previous study.   Suggest follow up study in 12 months.  __________   2D echo 11/01/2021: 1. Left ventricular ejection fraction, by estimation, is 35 to 40%. The  left ventricle has moderately decreased function. Left ventricular  endocardial border not optimally defined to evaluate regional wall motion.  There is mild left ventricular  hypertrophy. Left ventricular diastolic parameters are consistent with  Grade I diastolic dysfunction (impaired relaxation).   2. Right ventricular systolic function was not well visualized. The right  ventricular size is moderately enlarged.   3. The mitral valve is normal in structure. No evidence of mitral valve  regurgitation.   4. The aortic valve was not well visualized. Aortic valve regurgitation  is not visualized. __________   2d echo 02/05/2021: 1. Left ventricular ejection fraction, by estimation, is 40 to 45%. The  left ventricle has mildly decreased function. Left ventricular endocardial  border not optimally defined to evaluate regional wall motion. Left  ventricular diastolic parameters are  consistent with Grade I diastolic dysfunction (impaired relaxation).   2. Right ventricular systolic function is normal. The right ventricular  size is normal. There is moderately elevated pulmonary artery systolic  pressure. The estimated right ventricular systolic pressure is [id number] mmHg.   3. Left atrial size was mildly dilated.   4. The mitral valve is normal in structure. Mild mitral valve  regurgitation. No evidence of mitral stenosis.    5. Tricuspid valve regurgitation is moderate.   6. The aortic valve was not well visualized. Aortic valve regurgitation  is not visualized. No aortic stenosis is present.   7. The inferior vena cava is normal in size with greater than 50%  respiratory variability, suggesting right atrial pressure of 3 mmHg. __________   2D echo 09/20/2020: 1. Left ventricular ejection fraction, by estimation, is 45 to 50%. The  left ventricle has mildly decreased function. The left ventricle  demonstrates regional wall motion abnormalities (see scoring  diagram/findings for description). Left ventricular  diastolic parameters are consistent with Grade I diastolic dysfunction  (impaired relaxation). There is dyskinesis of the left ventricular, apical  segment.   2. Right ventricular systolic function is moderately reduced. The right  ventricular size is normal.   3. Left atrial size was mildly dilated.   4. The mitral valve is normal in structure. Mild mitral valve  regurgitation.   5. The aortic valve was not well visualized. Aortic valve regurgitation  is not visualized. No aortic stenosis is present. There is a 25 mm Edwards  valve present in the aortic position. Procedure Date: 02/19/18. Aortic valve  mean gradient measures 9.0  mmHg.   6. Pulmonic valve regurgitation not well assessed.   7. The inferior vena cava is normal in size with greater than 50%  respiratory variability, suggesting right atrial pressure of 3 mmHg. __________   See Epic for remaining studies.   EKG:  EKG is not ordered today.    Recent Labs: 10/31/2021: ALT 58 11/06/2021: Magnesium 1.8 06/21/2022: B Natriuretic Peptide 829.9 06/24/2022: BUN 72; Creatinine, Ser 1.36; Hemoglobin 15.9; Platelets 203; Potassium 4.5; Sodium 139  Recent Lipid Panel    Component Value Date/Time   CHOL 90 01/12/2021 0445   CHOL 102 03/27/2018 0900   TRIG 87 01/12/2021 0445   HDL 28 (L) 01/12/2021 0445   HDL 35 (L) 03/27/2018 0900    CHOLHDL 3.2 01/12/2021 0445   VLDL 17 01/12/2021 0445   LDLCALC 45 01/12/2021 0445   LDLCALC 50 03/27/2018 0900    PHYSICAL EXAM:    VS:  BP 108/60 (BP Location: Left Arm, Patient Position: Sitting, Cuff Size: Normal)   Pulse 80   Ht 5\' 5"  (1.651 m)   Wt 174 lb 12.8 oz (79.3 kg)   SpO2 93%   BMI 29.09 kg/m   BMI: Body mass index is 29.09 kg/m.  Physical Exam Vitals reviewed.  Constitutional:      Appearance: Harold Parker is well-developed.  HENT:     Head: Normocephalic and atraumatic.  Eyes:     General:        Right eye: No discharge.        Left eye: No discharge.  Neck:     Vascular: No JVD.  Cardiovascular:     Rate and Rhythm: Normal rate and regular rhythm.     Pulses:          Posterior tibial pulses are 2+ on the right side and 2+ on the left side.     Heart sounds: Normal heart sounds, S1 normal and S2 normal. Heart sounds not distant. No midsystolic click and no opening snap. No murmur heard.    No friction rub.  Pulmonary:  Effort: Pulmonary effort is normal. No respiratory distress.     Breath sounds: Normal breath sounds. No decreased breath sounds, wheezing or rales.     Comments: Supplemental oxygen via nasal cannula. Chest:     Chest wall: No tenderness.  Abdominal:     General: There is no distension.  Musculoskeletal:     Cervical back: Normal range of motion.     Right lower leg: Edema present.     Left lower leg: Edema present.     Comments: 1+ bilateral pretibial edema.  Skin:    General: Skin is warm and dry.     Nails: There is no clubbing.  Neurological:     Mental Status: Harold Parker is alert and oriented to person, place, and time.  Psychiatric:        Speech: Speech normal.        Behavior: Behavior normal.        Thought Content: Thought content normal.        Judgment: Judgment normal.     Wt Readings from Last 3 Encounters:  07/05/22 174 lb 12.8 oz (79.3 kg)  06/21/22 161 lb (73 kg)  05/17/22 165 lb (74.8 kg)     ASSESSMENT & PLAN:    HFrEF secondary to ICM/pulmonary hypertension: Harold Parker continues to be volume up as noted on echo with PA pressure of approximately 75 mmHg with diuresis being limited by AKI.  Harold Parker is also no longer on Entresto secondary to mild hyperkalemia during recent admission.  Harold Parker did not tolerate Farxiga secondary to AKI as well.  To further understand his hemodynamic status, and to assist with diuresis, possibly inpatient via IV, we will pursue [facility].  It remains uncertain if Harold Parker will require inotropic support to augment diuresis, with further recommendations based on cath findings.  If renal function allows today, would look to transition losartan back to [facility] to further optimize GDMT.  Continue to avoid SGLT 2 inhibitor for now given AKI.  Harold Parker remains on Toprol-XL and low-dose furosemide.  Escalation of outpatient diuresis has been limited by AKI.  CAD status post CABG with other forms of angina and dyspnea concerning for anginal equivalent: Currently chest pain-free.  Harold Parker does report a history of chest tightness that is intermittent and has previously been steroid responsive.  However, Harold Parker continues to have progressive dyspnea with evidence of volume overload on echo as outlined above, having failed outpatient diuresis secondary to AKI.  Given progression of symptoms and objective noninvasive imaging findings, we will pursue [facility] with his primary cardiologist.  Continue aggressive risk factor modification and secondary prevention including aspirin, ezetimibe, Toprol-XL, and rosuvastatin.  Other recommendations pending cardiac cath.  Aortic valve disease status post bioprosthetic AVR: Harold Parker remains on aspirin.  Most recent echo showed normal functioning bioprosthetic aortic valve.  SBE prophylaxis is recommended.  PAD: No symptoms of lifestyle limiting claudication.  Most recent noninvasive lower extremity imaging showed improved ABI on the left with patent left SFA stent.  This was not discussed in detail today given  context of the visit as outlined above.  HTN: Blood pressure is well controlled in the office today.  Medical therapy as outlined above.  HLD: LDL 45 with a triglyceride of 87.  Harold Parker remains on rosuvastatin and ezetimibe.  Chronic hypoxic respiratory failure: Pursue R/LHC as outlined above.  Harold Parker remains on supplemental oxygen.  Follow-up with pulmonology as directed as well.   Shared Decision Making/Informed Consent{  The risks [stroke (1 in 1000), death (1 in  1000), kidney failure [usually temporary] (1 in 500), bleeding (1 in 200), allergic reaction [possibly serious] (1 in 200)], benefits (diagnostic support and management of coronary artery disease) and alternatives of a cardiac catheterization were discussed in detail with Harold Parker and Harold Parker is willing to proceed.     Disposition: F/u with Dr. Fletcher Anon or an APP after cardiac cath.   Medication Adjustments/Labs and Tests Ordered: Current medicines are reviewed at length with the patient today.  Concerns regarding medicines are outlined above. Medication changes, Labs and Tests ordered today are summarized above and listed in the Patient Instructions accessible in Encounters.   Signed, Christell Faith, PA-C 07/05/2022 12:51 PM     [address] [place], [place] [id number] [phone]

## 2022-07-02 NOTE — H&P (View-Only) (Signed)
Cardiology Office Note    Date:  07/05/2022   ID:  Harold Parker, DOB 1946-09-17, MRN [id number]  PCP:  Merlene Laughter, MD  Cardiologist:  [name], MD  Electrophysiologist:  None   Chief Complaint: Follow-up  History of Present Illness:   Harold Parker is a 76 y.o. male with history of CAD status post CABG and bioprosthetic aortic valve replacement in 02/2018, HFrEF, pulmonary hypertension, PAD, DM2, HTN, HLD, GI bleed, and chronic hypoxic respiratory failure on supplemental oxygen, COVID, COPD with prior tobacco use who presents for follow-up of his CAD and cardiomyopathy.   He was admitted in 01/2018 with an NSTEMI.  LHC revealed severe left main stenosis in addition to significant disease involving the RCA and OM1.  EF was 40 to 45% with moderate aortic stenosis.  He subsequently underwent CABG and aortic valve replacement.  Postoperative course was notable for A-fib but that was controlled with amiodarone.  He was readmitted with an upper GI bleed with a hemoglobin of 4.7 with EGD demonstrating duodenal ulcers.     With regards to his PAD, lower extremity angiography in 02/2019, in the context of bilateral lower extremity claudication, showed no significant aortoiliac disease.  On the right, there was a long heavily calcified occlusion of the SFA with well-developed collaterals from the profunda and three-vessel runoff below the knee.  On the left, there was heavily calcified disease affecting the left SFA with three-vessel runoff below the knee.  He underwent successful orbital atherectomy and drug-coated balloon angioplasty to the left SFA.  Most recent noninvasive lower extremity imaging showed an improved ABI on the left with a patent left SFA.     He was admitted in 01/2021 with COVID-pneumonia and treated with steroids and remdesivir.  His oxygen requirement increased to 4 L.  He was readmitted later that same month with continued shortness of breath and again required treatment  with steroids.  His oxygen requirement could not be decreased below 4 L and this was established as his new baseline following COVID infection.  Echo performed during admission showed an EF of 40 to 45%, moderate pulmonary hypertension with a PASP of 58 mmHg, and mild mitral regurgitation.  He was admitted in 10/2021 with recurrent COVID-pneumonia complicated by acute on chronic respiratory failure and AKI with hyperkalemia.  Repeat echo showed an EF of 35 to 40% with moderately enlarged right ventricle and a normal functioning bioprosthetic aortic valve.  Work-up for DVT and PE was negative.  He was admitted in 12/2021 with acute on chronic hypoxic respiratory failure secondary to COPD and heart failure.  He was diuresed and given breathing treatments with improvement in symptoms.  He was seen in the office in 02/2022 and felt less short of breath and continued to require supplemental oxygen.  He was without symptoms of angina, decompensation, or claudication.  By our scale, his weight was down 3 pounds when compared to his visit in 11/2021.  Losartan was transitioned to [name].  Based on labs at that time, there was concern for volume depletion with his furosemide being decreased back to 40 mg daily.  He was seen in the office on 04/02/2022 after having had his Lasix titrated to 40 mg twice daily alternating with 40 mg daily due to lower extremity swelling and noted an improvement in symptoms with this, along with the addition of compression stockings.  Swelling was felt to possibly be in the setting of saltier foods consumed over the Easter holiday.  His weight  was down 2 pounds by our scale when compared to his prior clinic visit.  In an effort to optimize GDMT, amlodipine was discontinued and Entresto was titrated to 97/103 mg twice daily.  He was last seen in the office on 05/17/2022 and was doing reasonably well from a cardiac perspective.  He did note some chronic mild musculoskeletal anterior chest wall pain  that was worse over the preceding weeks and reproducible to palpation.  He noted a prior prescription of prednisone as well as prior NSAID use helped with this discomfort.  He was started on Farxiga with continuation of Toprol-XL and Entresto.  Subsequent Echo on 06/08/2022, to evaluate his cardiomyopathy on maximally tolerated GDMT, demonstrated an EF of 35 to 40%, global hypokinesis with severe apical hypokinesis with spontaneous contrast in the apical region, findings consistent with volume overload, mild LVH, grade 2 diastolic dysfunction, severely reduced RV systolic function with severely enlarged RV cavity size, PASP 74.9 mmHg, mildly dilated left atrium, severely dilated right atrium, mild to moderate mitral regurgitation, mild to moderate tricuspid regurgitation, and an estimated right atrial pressure of 8 mmHg.  With these findings, after discussion with his primary cardiologist, furosemide was titrated, however with this he developed some AKI needing furosemide to be de-escalated to 20 mg twice daily.  He was admitted to the hospital earlier this month with acute on chronic hypoxic respiratory failure felt to be more related to COPD exacerbation over CHF.  During the admission, initial and peak high-sensitivity troponin of 19 with a delta troponin of 18. BNP 829.  Chest x-ray showed no acute intrathoracic process.  CTA of the chest was negative for PE with moderate central lobar emphysema, aortic atherosclerosis, and dilated main pulmonary trunk consistent with pulmonary hypertension.  [name] was held at time of discharge.  He comes in today, continuing to note intermittent progressive dyspnea as well as chest tightness.  He did note the previously prescribed prednisone helped with this chest tightness as well as his breathing.  His weight is up 9 pounds today when compared to his last clinic visit.  He does have some increase in lower extremity swelling, though he recently drove down from  [place], [place].  He plans to put on compression stockings later today.  He has stable two-pillow orthopnea.  He is watching his fluid and salt intake.  No longer on Farxiga secondary to AKI.  He remains on supplemental oxygen and has pulmonary follow-up next week.   Labs independently reviewed: 06/2022 - Hgb 15.9, PLT 203, potassium 4.5, BUN 72, serum creatinine 1.36, A1c 8.0 10/2021 - magnesium 1.8, A1c 6.8, albumin 3.9, AST normal, ALT 58 01/2021 - TSH normal 12/2020 - TC 90, TG 87, HDL 28, LDL 45  Past Medical History:  Diagnosis Date   Bilateral carotid bruits    a. 01/2018 U/S: < 50% bilat ICA stenosis.   CAD (coronary artery disease)    a. 1998 s/p MI and BMS [place], [place]); b. 1999 redo PCI/rotablator in setting of what sounds like ISR;  c. Multiple stress tests over the years - last ~ 2017, reportedly nl; d. 01/2018 NSTEMI/Cath: LM 72m/d, LAD 50p, 40p/m, D1 60ost, OM1 95, RCA 100ost/p w/ L->R collats, EF 45%; e. s/p 3V CABG 02/19/18 (LIMA-LAD, VG-D1, VG-OM)   CHF (congestive heart failure) (HCC)    Chronic lower back pain    COPD (chronic obstructive pulmonary disease) ([place])    GIB (gastrointestinal bleeding)    a. 02/2018 GIB and anemia w/ Hgb of 4.7  on presentation; b. 03/2018 EGD: 2 nonbleeding duodenal ulcers.   HTN (hypertension)    Hypercholesteremia    Ischemic cardiomyopathy    a. 01/2018 Echo: EF 40-45%, mid-apicalanteroseptal, ant, apical sev HK, mod apicalinferior HK. Gr2 DD. Mod AS, mild MR, mod dil LA, PASP 42mmHg.   Moderate aortic stenosis    a. 01/2018 Echo: Mod AS, mean grad (S) 25mmHg, Valve area (VTI) 1.06 cm^2, (Vmax) 1.27 cm^2; b. 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   Myocardial infarction [facility]) ~ 1998/1999   S/P aortic valve replacement with bioprosthetic valve 02/19/2018   a. 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   LIMA to LAD, SVG to D1, SVG to OM, EVH via right thigh and leg   Tobacco abuse     Type II diabetes mellitus ([place])     Past Surgical History:  Procedure Laterality Date   ABDOMINAL AORTOGRAM N/A 03/04/2019   Procedure: ABDOMINAL AORTOGRAM;  Surgeon: Wellington Hampshire, MD;  Location: [place] [facility];  Service: Cardiovascular;  Laterality: N/A;   AORTIC VALVE REPLACEMENT N/A 02/19/2018   Procedure: AORTIC VALVE REPLACEMENT (AVR);  Surgeon: Rexene Alberts, MD;  Location: [place];  Service: Open Heart Surgery;  Laterality: N/A;   COLONOSCOPY     CORONARY ANGIOPLASTY WITH STENT PLACEMENT  ~ 1998/1999   CORONARY ARTERY BYPASS GRAFT N/A 02/19/2018   Procedure: CORONARY ARTERY BYPASS GRAFTING (CABG) x three , using left internal mammary artery and right leg greater saphenous vein harvested endoscopically;  Surgeon: Rexene Alberts, MD;  Location: [place];  Service: Open Heart Surgery;  Laterality: N/A;   ESOPHAGOGASTRODUODENOSCOPY (EGD) WITH PROPOFOL N/A 03/18/2018   Procedure: ESOPHAGOGASTRODUODENOSCOPY (EGD) WITH PROPOFOL;  Surgeon: Lucilla Lame, MD;  Location: [facility];  Service: Endoscopy;  Laterality: N/A;   LEFT HEART CATH AND CORONARY ANGIOGRAPHY N/A 02/06/2018   Procedure: LEFT HEART CATH AND CORONARY ANGIOGRAPHY;  Surgeon: Wellington Hampshire, MD;  Location: [place] [facility];  Service: Cardiovascular;  Laterality: N/A;   LOWER EXTREMITY ANGIOGRAPHY Bilateral 03/04/2019   Procedure: Lower Extremity Angiography;  Surgeon: Wellington Hampshire, MD;  Location: [place] [facility];  Service: Cardiovascular;  Laterality: Bilateral;   PERIPHERAL VASCULAR ATHERECTOMY Left 03/04/2019   Procedure: PERIPHERAL VASCULAR ATHERECTOMY;  Surgeon: Wellington Hampshire, MD;  Location: [place] [facility];  Service: Cardiovascular;  Laterality: Left;  SFA   TEE WITHOUT CARDIOVERSION N/A 02/19/2018   Procedure: TRANSESOPHAGEAL ECHOCARDIOGRAM (TEE);  Surgeon: Rexene Alberts, MD;  Location: [place];  Service: Open Heart Surgery;  Laterality: N/A;   TONSILLECTOMY      Current Medications: Current Meds   Medication Sig   acetaminophen (TYLENOL) 500 MG tablet Take 1,000 mg by mouth daily as needed for moderate pain or headache.   albuterol (PROVENTIL) (2.5 MG/3ML) 0.083% nebulizer solution Take 3 mLs (2.5 mg total) by nebulization every 6 (six) hours as needed for wheezing or shortness of breath.   ALPRAZolam (XANAX) 0.5 MG tablet Take 0.5 mg by mouth 2 (two) times daily.   aspirin EC 81 MG tablet Take 81 mg by mouth daily.   budesonide (PULMICORT) 0.5 MG/2ML nebulizer solution TAKE 2MLS BY NEBULIZATION TWICE DAILY USE AFTER PERFOROMIST   busPIRone (BUSPAR) 10 MG tablet Take 10 mg by mouth 2 (two) times daily.   cholecalciferol (VITAMIN D3) 25 MCG (1000 UNIT) tablet Take 4,000 Units by mouth daily.   esomeprazole (NEXIUM) 40 MG capsule Take 40 mg  by mouth at bedtime.    ezetimibe (ZETIA) 10 MG tablet Take 10 mg by mouth at bedtime.    formoterol (PERFOROMIST) 20 MCG/2ML nebulizer solution TAKE 2 MLS BY MOUTH BY NEBULIZATION TWICE DAILY   furosemide (LASIX) 20 MG tablet Take 1 tablet (20 mg total) by mouth daily.   glipiZIDE (GLUCOTROL) 5 MG tablet Take 1 tablet (5 mg total) by mouth 2 (two) times daily before a meal. Take 5 mg in the morning and take a second 5 mg dose at night on Mon, Wed, and Fri (Patient taking differently: Take 5 mg by mouth 2 (two) times daily before a meal.)   guaiFENesin (MUCINEX) 600 MG 12 hr tablet Take 1,200 mg by mouth every 12 (twelve) hours as needed.   meclizine (ANTIVERT) 25 MG tablet Take 1 tablet (25 mg total) by mouth 3 (three) times daily as needed for dizziness.   metFORMIN (GLUCOPHAGE) 1000 MG tablet TAKE 1 TABLET BY MOUTH TWICE DAILY WITH A MEAL   metoprolol succinate (TOPROL-XL) 50 MG 24 hr tablet TAKE 1 TABLET BY MOUTH EVERY DAY   Nebulizers (COMPRESSOR/NEBULIZER) MISC Use as directed   PROAIR HFA 108 (90 Base) MCG/ACT inhaler Inhale 1 puff into the lungs every 4 (four) hours as needed for wheezing or shortness of breath.   revefenacin (YUPELRI) 175  MCG/3ML nebulizer solution Take 3 mLs (175 mcg total) by nebulization daily.   rosuvastatin (CRESTOR) 10 MG tablet TAKE 1 TABLET BY MOUTH EVERY DAY   traZODone (DESYREL) 50 MG tablet Take 50 mg by mouth at bedtime as needed.    Allergies:   Atorvastatin and Atorvastatin calcium   Social History   Socioeconomic History   Marital status: Widowed    Spouse name: Not on file   Number of children: Not on file   Years of education: Not on file   Highest education level: Not on file  Occupational History   Not on file  Tobacco Use   Smoking status: Former    Packs/day: 1.00    Years: 40.00    Total pack years: 40.00    Types: Cigarettes    Quit date: 02/02/2018    Years since quitting: 4.4   Smokeless tobacco: Never   Tobacco comments:    Quit 01/2018 - on 05/01/2018 talked to Jar[place]ie about [place] concerns. He ststaes that he has no taste for tobacco since he quit in Feb.  Vaping Use   Vaping Use: Never used  Substance and Sexual Activity   Alcohol use: Never    Comment: occ   Drug use: No   Sexual activity: Yes  Other Topics Concern   Not on file  Social History Narrative   Lives in [place] by himself.  Retired Engineer, structural currently working as Patent examiner.  Fairly active but doesn't routinely exercise.   Social Determinants of Health   Financial Resource Strain: Not on file  Food Insecurity: Not on file  Transportation Needs: Not on file  Physical Activity: Not on file  Stress: Not on file  Social Connections: Not on file     Family History:  The patient's family history includes Lymphoma in his mother; Peripheral vascular disease in his father.  ROS:   12-point review of systems is negative unless otherwise noted in the HPI.   EKGs/Labs/Other Studies Reviewed:    Studies reviewed were summarized above. The additional studies were reviewed today:  2D echo 06/08/2022: 1. Left ventricular ejection fraction, by estimation, is 35 to  40%. The  left  ventricle has moderately decreased function. The left ventricle  demonstrates global hypokinesis with severe apical hypokinesis and  spontaneous contrast in the apical region.D  shaped septum systole and diastole consistent with RV volume and pressure  overload.   2. There is mild left ventricular hypertrophy. Left ventricular diastolic  parameters are consistent with Grade II diastolic dysfunction  (pseudonormalization). The average left ventricular global longitudinal  strain is 6.2 %. The global longitudinal  strain is abnormal.   3. Right ventricular systolic function is severely reduced. The right  ventricular size is severely enlarged. There is severely elevated  pulmonary artery systolic pressure. The estimated right ventricular  systolic pressure is 74.9 mmHg.   4. Left atrial size was mildly dilated.   5. Right atrial size was severely dilated.   6. The mitral valve is normal in structure. Mild to moderate mitral valve  regurgitation. No evidence of mitral stenosis.   7. Tricuspid valve regurgitation is mild to moderate.   8. The aortic valve was not well visualized. Aortic valve regurgitation  is not visualized. No aortic stenosis is present.   9. The inferior vena cava is dilated in size with >50% respiratory  variability, suggesting right atrial pressure of 8 mmHg. __________  ABIs 01/02/2022: ABI/TBIToday's ABIToday's TBIPrevious ABIPrevious TBI  +-------+-----------+-----------+------------+------------+  Right  0.82       0.34       0.73        0.36          +-------+-----------+-----------+------------+------------+  Left   0.84       0.38       0.74        not detected  +-------+-----------+-----------+------------+------------+  Bilateral ABIs appear increased compared to prior study on 09/20/20. Left  TBIs appear increased compared to prior study on 09/20/20.     Summary:  Right: Resting right ankle-brachial index indicates mild right lower   extremity arterial disease. The right toe-brachial index is abnormal.   Left: Resting left ankle-brachial index indicates mild left lower  extremity arterial disease. The left toe-brachial index is abnormal.     Suggest follow up study in 12 months.  __________   Arterial ultrasound 01/02/2022: Summary:  Left: 50-74% stenosis noted in the superficial femoral artery. Mild  progression is noted compared to previous study.   Suggest follow up study in 12 months.  __________   2D echo 11/01/2021: 1. Left ventricular ejection fraction, by estimation, is 35 to 40%. The  left ventricle has moderately decreased function. Left ventricular  endocardial border not optimally defined to evaluate regional wall motion.  There is mild left ventricular  hypertrophy. Left ventricular diastolic parameters are consistent with  Grade I diastolic dysfunction (impaired relaxation).   2. Right ventricular systolic function was not well visualized. The right  ventricular size is moderately enlarged.   3. The mitral valve is normal in structure. No evidence of mitral valve  regurgitation.   4. The aortic valve was not well visualized. Aortic valve regurgitation  is not visualized. __________   2d echo 02/05/2021: 1. Left ventricular ejection fraction, by estimation, is 40 to 45%. The  left ventricle has mildly decreased function. Left ventricular endocardial  border not optimally defined to evaluate regional wall motion. Left  ventricular diastolic parameters are  consistent with Grade I diastolic dysfunction (impaired relaxation).   2. Right ventricular systolic function is normal. The right ventricular  size is normal. There is moderately elevated pulmonary artery systolic  pressure. The estimated right ventricular systolic pressure is [id number] mmHg.   3. Left atrial size was mildly dilated.   4. The mitral valve is normal in structure. Mild mitral valve  regurgitation. No evidence of mitral stenosis.    5. Tricuspid valve regurgitation is moderate.   6. The aortic valve was not well visualized. Aortic valve regurgitation  is not visualized. No aortic stenosis is present.   7. The inferior vena cava is normal in size with greater than 50%  respiratory variability, suggesting right atrial pressure of 3 mmHg. __________   2D echo 09/20/2020: 1. Left ventricular ejection fraction, by estimation, is 45 to 50%. The  left ventricle has mildly decreased function. The left ventricle  demonstrates regional wall motion abnormalities (see scoring  diagram/findings for description). Left ventricular  diastolic parameters are consistent with Grade I diastolic dysfunction  (impaired relaxation). There is dyskinesis of the left ventricular, apical  segment.   2. Right ventricular systolic function is moderately reduced. The right  ventricular size is normal.   3. Left atrial size was mildly dilated.   4. The mitral valve is normal in structure. Mild mitral valve  regurgitation.   5. The aortic valve was not well visualized. Aortic valve regurgitation  is not visualized. No aortic stenosis is present. There is a 25 mm Edwards  valve present in the aortic position. Procedure Date: 02/19/18. Aortic valve  mean gradient measures 9.0  mmHg.   6. Pulmonic valve regurgitation not well assessed.   7. The inferior vena cava is normal in size with greater than 50%  respiratory variability, suggesting right atrial pressure of 3 mmHg. __________   See Epic for remaining studies.   EKG:  EKG is not ordered today.    Recent Labs: 10/31/2021: ALT 58 11/06/2021: Magnesium 1.8 06/21/2022: B Natriuretic Peptide 829.9 06/24/2022: BUN 72; Creatinine, Ser 1.36; Hemoglobin 15.9; Platelets 203; Potassium 4.5; Sodium 139  Recent Lipid Panel    Component Value Date/Time   CHOL 90 01/12/2021 0445   CHOL 102 03/27/2018 0900   TRIG 87 01/12/2021 0445   HDL 28 (L) 01/12/2021 0445   HDL 35 (L) 03/27/2018 0900    CHOLHDL 3.2 01/12/2021 0445   VLDL 17 01/12/2021 0445   LDLCALC 45 01/12/2021 0445   LDLCALC 50 03/27/2018 0900    PHYSICAL EXAM:    VS:  BP 108/60 (BP Location: Left Arm, Patient Position: Sitting, Cuff Size: Normal)   Pulse 80   Ht 5\' 5"  (1.651 m)   Wt 174 lb 12.8 oz (79.3 kg)   SpO2 93%   BMI 29.09 kg/m   BMI: Body mass index is 29.09 kg/m.  Physical Exam Vitals reviewed.  Constitutional:      Appearance: He is well-developed.  HENT:     Head: Normocephalic and atraumatic.  Eyes:     General:        Right eye: No discharge.        Left eye: No discharge.  Neck:     Vascular: No JVD.  Cardiovascular:     Rate and Rhythm: Normal rate and regular rhythm.     Pulses:          Posterior tibial pulses are 2+ on the right side and 2+ on the left side.     Heart sounds: Normal heart sounds, S1 normal and S2 normal. Heart sounds not distant. No midsystolic click and no opening snap. No murmur heard.    No friction rub.  Pulmonary:  Effort: Pulmonary effort is normal. No respiratory distress.     Breath sounds: Normal breath sounds. No decreased breath sounds, wheezing or rales.     Comments: Supplemental oxygen via nasal cannula. Chest:     Chest wall: No tenderness.  Abdominal:     General: There is no distension.  Musculoskeletal:     Cervical back: Normal range of motion.     Right lower leg: Edema present.     Left lower leg: Edema present.     Comments: 1+ bilateral pretibial edema.  Skin:    General: Skin is warm and dry.     Nails: There is no clubbing.  Neurological:     Mental Status: He is alert and oriented to person, place, and time.  Psychiatric:        Speech: Speech normal.        Behavior: Behavior normal.        Thought Content: Thought content normal.        Judgment: Judgment normal.     Wt Readings from Last 3 Encounters:  07/05/22 174 lb 12.8 oz (79.3 kg)  06/21/22 161 lb (73 kg)  05/17/22 165 lb (74.8 kg)     ASSESSMENT & PLAN:    HFrEF secondary to ICM/pulmonary hypertension: He continues to be volume up as noted on echo with PA pressure of approximately 75 mmHg with diuresis being limited by AKI.  He is also no longer on Entresto secondary to mild hyperkalemia during recent admission.  He did not tolerate Farxiga secondary to AKI as well.  To further understand his hemodynamic status, and to assist with diuresis, possibly inpatient via IV, we will pursue [facility].  It remains uncertain if he will require inotropic support to augment diuresis, with further recommendations based on cath findings.  If renal function allows today, would look to transition losartan back to [facility] to further optimize GDMT.  Continue to avoid SGLT 2 inhibitor for now given AKI.  He remains on Toprol-XL and low-dose furosemide.  Escalation of outpatient diuresis has been limited by AKI.  CAD status post CABG with other forms of angina and dyspnea concerning for anginal equivalent: Currently chest pain-free.  He does report a history of chest tightness that is intermittent and has previously been steroid responsive.  However, he continues to have progressive dyspnea with evidence of volume overload on echo as outlined above, having failed outpatient diuresis secondary to AKI.  Given progression of symptoms and objective noninvasive imaging findings, we will pursue [facility] with his primary cardiologist.  Continue aggressive risk factor modification and secondary prevention including aspirin, ezetimibe, Toprol-XL, and rosuvastatin.  Other recommendations pending cardiac cath.  Aortic valve disease status post bioprosthetic AVR: He remains on aspirin.  Most recent echo showed normal functioning bioprosthetic aortic valve.  SBE prophylaxis is recommended.  PAD: No symptoms of lifestyle limiting claudication.  Most recent noninvasive lower extremity imaging showed improved ABI on the left with patent left SFA stent.  This was not discussed in detail today given  context of the visit as outlined above.  HTN: Blood pressure is well controlled in the office today.  Medical therapy as outlined above.  HLD: LDL 45 with a triglyceride of 87.  He remains on rosuvastatin and ezetimibe.  Chronic hypoxic respiratory failure: Pursue R/LHC as outlined above.  He remains on supplemental oxygen.  Follow-up with pulmonology as directed as well.   Shared Decision Making/Informed Consent{  The risks [stroke (1 in 1000), death (1 in  1000), kidney failure [usually temporary] (1 in 500), bleeding (1 in 200), allergic reaction [possibly serious] (1 in 200)], benefits (diagnostic support and management of coronary artery disease) and alternatives of a cardiac catheterization were discussed in detail with Mr. Henckel and he is willing to proceed.     Disposition: F/u with Dr. Fletcher Anon or an APP after cardiac cath.   Medication Adjustments/Labs and Tests Ordered: Current medicines are reviewed at length with the patient today.  Concerns regarding medicines are outlined above. Medication changes, Labs and Tests ordered today are summarized above and listed in the Patient Instructions accessible in Encounters.   Signed, Christell Faith, PA-C 07/05/2022 12:51 PM     [address] [place], [place] [address] [phone]

## 2022-07-05 ENCOUNTER — Telehealth: Payer: Self-pay | Admitting: *Deleted

## 2022-07-05 ENCOUNTER — Other Ambulatory Visit: Payer: Self-pay | Admitting: Physician Assistant

## 2022-07-05 ENCOUNTER — Encounter: Payer: Self-pay | Admitting: *Deleted

## 2022-07-05 ENCOUNTER — Encounter: Payer: Self-pay | Admitting: Physician Assistant

## 2022-07-05 ENCOUNTER — Ambulatory Visit (INDEPENDENT_AMBULATORY_CARE_PROVIDER_SITE_OTHER): Payer: Medicare Other | Admitting: Physician Assistant

## 2022-07-05 ENCOUNTER — Other Ambulatory Visit
Admission: RE | Admit: 2022-07-05 | Discharge: 2022-07-05 | Disposition: A | Discharge status: Home / Self Care | Payer: Medicare Other | Attending: Physician Assistant | Admitting: Physician Assistant

## 2022-07-05 VITALS — BP 108/60 | HR 80 | Ht 65.0 in | Wt 174.8 lb

## 2022-07-05 DIAGNOSIS — Z953 Presence of xenogenic heart valve: Secondary | ICD-10-CM

## 2022-07-05 DIAGNOSIS — I255 Ischemic cardiomyopathy: Secondary | ICD-10-CM | POA: Insufficient documentation

## 2022-07-05 DIAGNOSIS — I5022 Chronic systolic (congestive) heart failure: Secondary | ICD-10-CM

## 2022-07-05 DIAGNOSIS — Z951 Presence of aortocoronary bypass graft: Secondary | ICD-10-CM | POA: Diagnosis present

## 2022-07-05 DIAGNOSIS — I25118 Atherosclerotic heart disease of native coronary artery with other forms of angina pectoris: Secondary | ICD-10-CM | POA: Diagnosis present

## 2022-07-05 DIAGNOSIS — I272 Pulmonary hypertension, unspecified: Secondary | ICD-10-CM

## 2022-07-05 DIAGNOSIS — J9611 Chronic respiratory failure with hypoxia: Secondary | ICD-10-CM

## 2022-07-05 DIAGNOSIS — I739 Peripheral vascular disease, unspecified: Secondary | ICD-10-CM

## 2022-07-05 DIAGNOSIS — E785 Hyperlipidemia, unspecified: Secondary | ICD-10-CM

## 2022-07-05 DIAGNOSIS — I509 Heart failure, unspecified: Secondary | ICD-10-CM

## 2022-07-05 DIAGNOSIS — I10 Essential (primary) hypertension: Secondary | ICD-10-CM

## 2022-07-05 LAB — CBC
HCT: 45.5 % (ref 39.0–52.0)
Hemoglobin: 15.3 g/dL (ref 13.0–17.0)
MCH: 29.1 pg (ref 26.0–34.0)
MCHC: 33.6 g/dL (ref 30.0–36.0)
MCV: 86.5 fL (ref 80.0–100.0)
Platelets: 145 10*3/uL — ABNORMAL LOW (ref 150–400)
RBC: 5.26 MIL/uL (ref 4.22–5.81)
RDW: 15.4 % (ref 11.5–15.5)
WBC: 9.4 10*3/uL (ref 4.0–10.5)
nRBC: 0 % (ref 0.0–0.2)

## 2022-07-05 LAB — BASIC METABOLIC PANEL
Anion gap: 7 (ref 5–15)
BUN: 32 mg/dL — ABNORMAL HIGH (ref 8–23)
CO2: 26 mmol/L (ref 22–32)
Calcium: 8.9 mg/dL (ref 8.9–10.3)
Chloride: 107 mmol/L (ref 98–111)
Creatinine, Ser: 1.14 mg/dL (ref 0.61–1.24)
GFR, Estimated: >60 mL/min (ref >60)
Glucose, Bld: 154 mg/dL — ABNORMAL HIGH (ref 70–99)
Potassium: 4.4 mmol/L (ref 3.5–5.1)
Sodium: 140 mmol/L (ref 135–145)

## 2022-07-05 NOTE — Progress Notes (Signed)
Orders for cardiac cath have been signed and held.  

## 2022-07-05 NOTE — Telephone Encounter (Signed)
Left voicemail message to call back for review of results and recommendations.  

## 2022-07-05 NOTE — Patient Instructions (Addendum)
Medication Instructions:  No changes at this time.   *If you need a refill on your cardiac medications before your next appointment, please call your pharmacy*   Lab Work: CBC & BMET today over at the Medical Mall Entrance at [facility] then go to 1st desk on the right to check in (REGISTRATION)   If you have labs (blood work) drawn today and your tests are completely normal, you will receive your results only by: MyChart Message (if you have MyChart) OR A paper copy in the mail If you have any lab test that is abnormal or we need to change your treatment, we will call you to review the results.   Testing/Procedures: [facility] Cardiac Cath Instructions  You are scheduled for a Cardiac Cath on: Monday July 31st with Dr. Kirke Corin Please arrive at 08:30 am on the day of your procedure Please expect a call from our [facility] Pre-Service Center to pre-register you Do not eat/drink anything after midnight Someone will need to drive you home It is recommended someone be with you for the first 24 hours after your procedure Wear clothes that are easy to get on/off and wear slip on shoes if possible   Medications bring a current list of all medications with you  _XX__ Do not take these medications before your procedure:   HOLD Metformin the night before and morning of your procedure. HOLD Glipizide the morning of your procedure. HOLD Furosemide the morning of your procedure.  _XX__ You may take all of your remaining medications the morning of your procedure with enough water to swallow safely   Day of your procedure: Arrive at the Medical Mall entrance.  Free valet service is available.  After entering the Medical Mall please check-in at the registration desk (1st desk on your right) to receive your armband. After receiving your armband someone will escort you to the cardiac cath/special procedures waiting area.  The usual length of stay after your procedure is about 2 to 3 hours.  This can  vary.  If you have any questions, please call our office at [phone], or you may call the cardiac cath lab at [facility] directly at [phone]    Follow-Up: At [facility], you and your health needs are our priority.  As part of our continuing mission to provide you with exceptional heart care, we have created designated Provider Care Teams.  These Care Teams include your primary Cardiologist (physician) and Advanced Practice Providers (APPs -  Physician Assistants and Nurse Practitioners) who all work together to provide you with the care you need, when you need it.   Your next appointment:   4 week(s)  The format for your next appointment:   In Person  Provider:   [name], MD or Eula Listen, PA-C       Important Information About Sugar

## 2022-07-05 NOTE — Telephone Encounter (Signed)
-----   Message from Sondra Barges, [place] sent at 07/05/2022  3:13 PM EDT ----- Jolaine Click labs  Please inform the patient his renal function has improved with creatinine now normal and a mildly elevated BUN.  Potassium is also normal.  Blood count is normal with a mildly low platelet count.  Recommendations: -Discontinue losartan -Resume Entresto at lower dose 24/26 mg twice daily in an effort to avoid significant hypotension -Recheck BMP 1 week after resuming Sherryll Burger

## 2022-07-06 MED ORDER — ENTRESTO 24-26 MG PO TABS: 1.0000 | ORAL_TABLET | Freq: Two times a day (BID) | ORAL | 11 refills | Status: AC

## 2022-07-06 NOTE — Telephone Encounter (Signed)
Reviewed results and recommendations with patient. He verbalized understanding, agreeable with plan, and had no further questions at this time. Advised I would send these instructions via My Chart as well. He was appreciative.

## 2022-07-06 NOTE — Telephone Encounter (Signed)
Patient is returning call and is requesting call back.  

## 2022-07-09 ENCOUNTER — Ambulatory Visit (INDEPENDENT_AMBULATORY_CARE_PROVIDER_SITE_OTHER): Payer: Medicare Other | Admitting: Pulmonary Disease

## 2022-07-09 ENCOUNTER — Encounter: Payer: Self-pay | Admitting: Pulmonary Disease

## 2022-07-09 VITALS — BP 110/64 | HR 83 | Temp 97.9°F | Ht 65.0 in | Wt 170.0 lb

## 2022-07-09 DIAGNOSIS — I5022 Chronic systolic (congestive) heart failure: Secondary | ICD-10-CM

## 2022-07-09 DIAGNOSIS — J9611 Chronic respiratory failure with hypoxia: Secondary | ICD-10-CM

## 2022-07-09 DIAGNOSIS — J449 Chronic obstructive pulmonary disease, unspecified: Secondary | ICD-10-CM

## 2022-07-09 DIAGNOSIS — I272 Pulmonary hypertension, unspecified: Secondary | ICD-10-CM

## 2022-07-09 NOTE — Patient Instructions (Signed)
Continue your nebulizer therapy.  Use your oxygen therapy.   We will see you in follow-up in 2 to 3 months.  Call sooner should any new problems arise.  We will stay in touch with Dr. Kirke Corin with regards to the results of your catheterization.

## 2022-07-09 NOTE — Progress Notes (Signed)
Subjective:    Patient ID: Harold Parker, male    DOB: June 28, 1946, 76 y.o.   MRN: [id number] Patient Care Team: Merlene Laughter, MD as PCP - General (Internal Medicine) Iran Ouch, MD as PCP - Cardiology (Cardiology)  Chief Complaint  Patient presents with   Follow-up    HPI Patient is a 76 year old former smoker with stage IV, very severe, COPD and chronic hypoxemic respiratory failure secondary to the same on oxygen therapy, who presents for follow-up on the above.  This is a scheduled visit.  Patient was last evaluated here on 02 April 2022 and at that time he was as compensated as he was going to get.  Continues to be on his nebulizer medications which he notes that these help him.  He is unable to use metered-dose inhalers due to inability to breath-hold.  He has had 2 episodes of COVID-19 pneumonia last 1 resulted in acute renal failure in November 2022.  These issues subsequently improved.  He has had increasing dyspnea that has been progressive since November.  He is maximized on his oxygen and nebulizer therapy for lung disease.  He follows with cardiology and he is to have a cardiac cath by Dr. Kirke Corin at that will include right heart cath.  This is planned for 31 July.  As noted the patient has had increased shortness of breath though his oxygen requirements remain the same.  Not had any change in cough or sputum production.  No hemoptysis.  He has been having chest pain which is the reason for repeat cath.     Review of Systems A 10 point review of systems was performed and it is as noted above otherwise negative.  Patient Active Problem List   Diagnosis Date Noted   Severe pulmonary hypertension (HCC) 06/23/2022   BPV (benign positional vertigo) 06/22/2022   Overweight (BMI 25.0-29.9) 06/22/2022   Acute on chronic congestive heart failure (HCC)    Acute metabolic acidosis 11/01/2021   COVID-19 10/31/2021   Chronic respiratory failure with hypoxia (HCC) 05/02/2021    Insomnia    History of COVID-19 pneumonia 01/11/21 02/04/2021   Pulmonary nodules    Acute respiratory disease due to COVID-19 virus 01/11/2021   HTN (hypertension)    Chronic systolic CHF (congestive heart failure) (HCC)    COPD (chronic obstructive pulmonary disease) (HCC)    GERD (gastroesophageal reflux disease)    Anxiety    AKI (acute kidney injury) (HCC)    Hyperkalemia    Elevated troponin    Acute hypoxemic respiratory failure due to COVID-19 [facility])    Melena    Duodenal ulceration    Gastrointestinal hemorrhage    Severe anemia 03/14/2018   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   S/P aortic valve replacement with bioprosthetic valve  02/19/2018   Preoperative respiratory examination 02/13/2018   Acute on chronic respiratory failure with hypoxia (HCC) 02/11/2018   Stage 3 severe COPD by GOLD classification (HCC)    Aortic stenosis    Bronchitis, chronic obstructive, with exacerbation (HCC)    Tobacco abuse    CAD (coronary artery disease) 02/07/2018   Acute systolic CHF (congestive heart failure) (HCC) 02/07/2018   Ischemic cardiomyopathy 02/07/2018   Type 2 diabetes mellitus with hyperglycemia, without long-term current use of insulin (HCC) 02/07/2018   Hyperlipidemia LDL goal <70 02/07/2018   COPD with acute exacerbation (HCC) 02/05/2018   NSTEMI (non-ST elevated myocardial infarction) (HCC) 02/05/2018   Social History   Tobacco Use   Smoking  status: Former    Packs/day: 1.00    Years: 40.00    Total pack years: 40.00    Types: Cigarettes    Quit date: 02/02/2018    Years since quitting: 4.4   Smokeless tobacco: Never   Tobacco comments:    Quit 01/2018 - on 05/01/2018 talked to Marvion about [place] concerns. He ststaes that he has no taste for tobacco since he quit in Feb.  Substance Use Topics   Alcohol use: Never    Comment: occ   Allergies  Allergen Reactions   Atorvastatin Other (See Comments)    Leg aches and weakness  Other reaction(s): Other (See  Comments) Leg aches and weakness  Other reaction(s): Other (See Comments) Leg aches and weakness   Atorvastatin Calcium Other (See Comments)   Current Meds  Medication Sig   acetaminophen (TYLENOL) 500 MG tablet Take 1,000 mg by mouth daily as needed for moderate pain or headache.   albuterol (PROVENTIL) (2.5 MG/3ML) 0.083% nebulizer solution Take 3 mLs (2.5 mg total) by nebulization every 6 (six) hours as needed for wheezing or shortness of breath.   ALPRAZolam (XANAX) 0.5 MG tablet Take 0.5 mg by mouth 2 (two) times daily.   aspirin EC 81 MG tablet Take 81 mg by mouth daily.   budesonide (PULMICORT) 0.5 MG/2ML nebulizer solution TAKE BY NEBULIZATION TWICE DAILY USE AFTER PERFOROMIST   busPIRone (BUSPAR) 10 MG tablet Take 10 mg by mouth 2 (two) times daily.   cholecalciferol (VITAMIN D3) 25 MCG (1000 UNIT) tablet Take 4,000 Units by mouth daily.   esomeprazole (NEXIUM) 40 MG capsule Take 40 mg by mouth at bedtime.    ezetimibe (ZETIA) 10 MG tablet Take 10 mg by mouth at bedtime.    formoterol (PERFOROMIST) 20 MCG/2ML nebulizer solution TAKE 2 MLS BY MOUTH BY NEBULIZATION TWICE DAILY   furosemide (LASIX) 20 MG tablet Take 1 tablet (20 mg total) by mouth daily.   glipiZIDE (GLUCOTROL) 5 MG tablet Take 1 tablet (5 mg total) by mouth 2 (two) times daily before a meal. Take 5 mg in the morning and take a second 5 mg dose at night on Mon, Wed, and Fri (Patient taking differently: Take 5 mg by mouth 2 (two) times daily before a meal.)   guaiFENesin (MUCINEX) 600 MG 12 hr tablet Take 1,200 mg by mouth every 12 (twelve) hours as needed.   meclizine (ANTIVERT) 25 MG tablet Take 1 tablet (25 mg total) by mouth 3 (three) times daily as needed for dizziness.   metFORMIN (GLUCOPHAGE) 1000 MG tablet TAKE 1 TABLET BY MOUTH TWICE DAILY WITH A MEAL   metoprolol succinate (TOPROL-XL) 50 MG 24 hr tablet TAKE 1 TABLET BY MOUTH EVERY DAY   Nebulizers (COMPRESSOR/NEBULIZER) MISC Use as directed   PROAIR HFA  108 (90 Base) MCG/ACT inhaler Inhale 1 puff into the lungs every 4 (four) hours as needed for wheezing or shortness of breath.   revefenacin (YUPELRI) 175 MCG/3ML nebulizer solution Take 3 mLs (175 mcg total) by nebulization daily.   rosuvastatin (CRESTOR) 10 MG tablet TAKE 1 TABLET BY MOUTH EVERY DAY   sacubitril-valsartan (ENTRESTO) 24-26 MG Take 1 tablet by mouth 2 (two) times daily.   traZODone (DESYREL) 50 MG tablet Take 50 mg by mouth at bedtime as needed.   Immunization History  Administered Date(s) Administered   Influenza Inj Mdck Quad Pf 12/02/2017   Influenza Split 12/11/2014, 11/01/2019   Influenza, High Dose Seasonal PF 09/16/2018, 08/21/2019, 11/01/2020   Influenza-Unspecified 09/18/2021  PFIZER(Purple Top)SARS-COV-2 Vaccination 02/06/2020, 02/29/2020       Objective:   Physical Exam BP 110/64 (BP Location: Left Arm, Cuff Size: Normal)   Pulse 83   Temp 97.9 F (36.6 C) (Temporal)   Ht 5\' 5"  (1.651 m)   Wt 170 lb (77.1 kg)   SpO2 93%   BMI 28.29 kg/m   SpO2: 93 % O2 Device: Nasal cannula O2 Flow Rate (L/min): 4 L/min O2 Type: Continuous O2   GENERAL: Chronically ill-appearing man, well nourished, well developed. No conversational dyspnea. Comfortable with nasal cannula O2 at 4 L/min, sats 93%.  Presents in transport chair due to dyspnea. HEAD: Normocephalic, atraumatic.  EYES: Pupils equal, round, reactive to light.  No scleral icterus.  MOUTH: Oral mucosa moist.  No thrush. NECK: Supple. No thyromegaly. Trachea midline. No JVD.  No adenopathy. PULMONARY: Distant breath sounds.  Coarse breath sounds with no other adventitious sounds. CARDIOVASCULAR: S1 and S2. Regular rate and rhythm.  1/6 systolic murmur at the lower left sternal border. ABDOMEN: Benign. MUSCULOSKELETAL: No joint deformity, no clubbing, no edema.  NEUROLOGIC: No overt focal deficit, speech is fluent. SKIN: Intact,warm,dry.  On limited exam no rashes. PSYCH: Mood and behavior normal.       Assessment & Plan:     ICD-10-CM   1. Stage 4 very severe COPD by GOLD classification ([place])  J44.9    Continue nebulizer therapy as current    2. Pulmonary hypertension (HCC)  I27.20    Suspect this may be worsening Agree with right and left heart cath per Dr. Fletcher Anon    3. Chronic respiratory failure with hypoxia (HCC)  J96.11    Continue oxygen at 4 L/min    4. Chronic systolic CHF (congestive heart failure) (HCC)  I50.22    This issue adds complexity to his management Repeat cath planned as above     The patient is as compensated as he can be with regards to his COPD.  Suspect that his pulmonary hypertension is worsening.  His options are very limited.  Patient would like to defer end-of-life discussions until after the cardiac cath is done.    Will see the patient in follow-up in 2 to 3 months time he is to call sooner should any new problems arise.  Renold Don, MD Advanced Bronchoscopy [facility]    *This note was dictated using voice recognition software/Dragon.  Despite best efforts to proofread, errors can occur which can change the meaning. Any transcriptional errors that result from this process are unintentional and may not be fully corrected at the time of dictation.

## 2022-07-12 ENCOUNTER — Ambulatory Visit (INDEPENDENT_AMBULATORY_CARE_PROVIDER_SITE_OTHER): Payer: Medicare Other

## 2022-07-12 ENCOUNTER — Telehealth: Payer: Self-pay | Admitting: *Deleted

## 2022-07-12 DIAGNOSIS — I10 Essential (primary) hypertension: Secondary | ICD-10-CM | POA: Diagnosis not present

## 2022-07-12 NOTE — Telephone Encounter (Signed)
-----   Message from Sondra Barges, PA-C sent at 07/12/2022 12:51 PM EDT ----- Please inform the patient the renal artery ultrasound was not diagnostic. I will route to Dr. Kirke Corin, as he will be performing the patient's [facility]. He may want to perform an angiography of the renal arteries during the cath for definitive evaluation.

## 2022-07-12 NOTE — Telephone Encounter (Signed)
Call dropped. Will try to give him a call tomorrow.

## 2022-07-12 NOTE — Telephone Encounter (Signed)
Reviewed results and recommendations with patient and he verbalized understanding with no further questions at this time.  

## 2022-07-13 ENCOUNTER — Other Ambulatory Visit
Admission: RE | Admit: 2022-07-13 | Discharge: 2022-07-13 | Disposition: A | Discharge status: Home / Self Care | Payer: Medicare Other | Attending: Physician Assistant | Admitting: Physician Assistant

## 2022-07-13 DIAGNOSIS — I509 Heart failure, unspecified: Secondary | ICD-10-CM | POA: Diagnosis present

## 2022-07-13 DIAGNOSIS — I255 Ischemic cardiomyopathy: Secondary | ICD-10-CM

## 2022-07-13 DIAGNOSIS — I272 Pulmonary hypertension, unspecified: Secondary | ICD-10-CM

## 2022-07-13 DIAGNOSIS — I25118 Atherosclerotic heart disease of native coronary artery with other forms of angina pectoris: Secondary | ICD-10-CM | POA: Insufficient documentation

## 2022-07-13 DIAGNOSIS — I5022 Chronic systolic (congestive) heart failure: Secondary | ICD-10-CM | POA: Diagnosis present

## 2022-07-13 LAB — BASIC METABOLIC PANEL
Anion gap: 5 (ref 5–15)
BUN: 23 mg/dL (ref 8–23)
CO2: 27 mmol/L (ref 22–32)
Calcium: 9.1 mg/dL (ref 8.9–10.3)
Chloride: 111 mmol/L (ref 98–111)
Creatinine, Ser: 1.05 mg/dL (ref 0.61–1.24)
GFR, Estimated: >60 mL/min (ref >60)
Glucose, Bld: 95 mg/dL (ref 70–99)
Potassium: 4.5 mmol/L (ref 3.5–5.1)
Sodium: 143 mmol/L (ref 135–145)

## 2022-07-16 ENCOUNTER — Other Ambulatory Visit: Payer: Self-pay

## 2022-07-16 ENCOUNTER — Encounter: Payer: Self-pay | Admitting: Cardiovascular Disease

## 2022-07-16 ENCOUNTER — Ambulatory Visit
Admission: RE | Admit: 2022-07-16 | Discharge: 2022-07-16 | Disposition: A | Discharge status: Home / Self Care | Payer: Medicare Other | Attending: Cardiovascular Disease | Admitting: Cardiovascular Disease

## 2022-07-16 ENCOUNTER — Encounter
Admission: RE | Disposition: A | Discharge status: Home / Self Care | Payer: Self-pay | Source: Home / Self Care | Attending: Cardiovascular Disease

## 2022-07-16 DIAGNOSIS — Z9981 Dependence on supplemental oxygen: Secondary | ICD-10-CM | POA: Diagnosis not present

## 2022-07-16 DIAGNOSIS — I252 Old myocardial infarction: Secondary | ICD-10-CM | POA: Insufficient documentation

## 2022-07-16 DIAGNOSIS — I272 Pulmonary hypertension, unspecified: Secondary | ICD-10-CM | POA: Diagnosis not present

## 2022-07-16 DIAGNOSIS — Z951 Presence of aortocoronary bypass graft: Secondary | ICD-10-CM | POA: Insufficient documentation

## 2022-07-16 DIAGNOSIS — I110 Hypertensive heart disease with heart failure: Secondary | ICD-10-CM | POA: Insufficient documentation

## 2022-07-16 DIAGNOSIS — E1151 Type 2 diabetes mellitus with diabetic peripheral angiopathy without gangrene: Secondary | ICD-10-CM | POA: Diagnosis not present

## 2022-07-16 DIAGNOSIS — I5042 Chronic combined systolic (congestive) and diastolic (congestive) heart failure: Secondary | ICD-10-CM

## 2022-07-16 DIAGNOSIS — J440 Chronic obstructive pulmonary disease with acute lower respiratory infection: Secondary | ICD-10-CM | POA: Diagnosis not present

## 2022-07-16 DIAGNOSIS — Z87891 Personal history of nicotine dependence: Secondary | ICD-10-CM | POA: Diagnosis not present

## 2022-07-16 DIAGNOSIS — I2510 Atherosclerotic heart disease of native coronary artery without angina pectoris: Secondary | ICD-10-CM | POA: Insufficient documentation

## 2022-07-16 DIAGNOSIS — J9611 Chronic respiratory failure with hypoxia: Secondary | ICD-10-CM | POA: Diagnosis not present

## 2022-07-16 DIAGNOSIS — I255 Ischemic cardiomyopathy: Secondary | ICD-10-CM | POA: Insufficient documentation

## 2022-07-16 DIAGNOSIS — I5022 Chronic systolic (congestive) heart failure: Secondary | ICD-10-CM | POA: Diagnosis not present

## 2022-07-16 DIAGNOSIS — I2582 Chronic total occlusion of coronary artery: Secondary | ICD-10-CM | POA: Insufficient documentation

## 2022-07-16 DIAGNOSIS — I509 Heart failure, unspecified: Secondary | ICD-10-CM

## 2022-07-16 DIAGNOSIS — I2584 Coronary atherosclerosis due to calcified coronary lesion: Secondary | ICD-10-CM | POA: Diagnosis not present

## 2022-07-16 DIAGNOSIS — E785 Hyperlipidemia, unspecified: Secondary | ICD-10-CM | POA: Diagnosis not present

## 2022-07-16 DIAGNOSIS — Z953 Presence of xenogenic heart valve: Secondary | ICD-10-CM | POA: Insufficient documentation

## 2022-07-16 HISTORY — PX: RIGHT/LEFT HEART CATH AND CORONARY ANGIOGRAPHY: CATH118266

## 2022-07-16 LAB — GLUCOSE, CAPILLARY: Glucose-Capillary: 130 mg/dL — ABNORMAL HIGH (ref 70–99)

## 2022-07-16 SURGERY — RIGHT/LEFT HEART CATH AND CORONARY ANGIOGRAPHY
Anesthesia: Moderate Sedation | Laterality: Bilateral

## 2022-07-16 MED ORDER — SODIUM CHLORIDE 0.9 % IV SOLN: 250.0000 mL | INTRAVENOUS | Status: DC | PRN

## 2022-07-16 MED ORDER — FENTANYL CITRATE (PF) 100 MCG/2ML IJ SOLN
INTRAMUSCULAR | Status: AC
  Filled 2022-07-16: qty 2

## 2022-07-16 MED ORDER — MIDAZOLAM HCL 2 MG/2ML IJ SOLN
INTRAMUSCULAR | Status: AC
  Filled 2022-07-16: qty 2

## 2022-07-16 MED ORDER — MIDAZOLAM HCL 2 MG/2ML IJ SOLN
1.0000 mg | Freq: Once | INTRAMUSCULAR | Status: AC
  Administered 2022-07-16: 1 mg via INTRAVENOUS

## 2022-07-16 MED ORDER — LIDOCAINE HCL 1 % IJ SOLN
INTRAMUSCULAR | Status: AC
  Filled 2022-07-16: qty 20

## 2022-07-16 MED ORDER — SODIUM CHLORIDE 0.9% FLUSH
3.0000 mL | Freq: Two times a day (BID) | INTRAVENOUS | Status: DC
Start: 2022-07-16 — End: 2022-07-16

## 2022-07-16 MED ORDER — HEPARIN SODIUM (PORCINE) 1000 UNIT/ML IJ SOLN
INTRAMUSCULAR | Status: AC
  Filled 2022-07-16: qty 10

## 2022-07-16 MED ORDER — SODIUM CHLORIDE 0.9 % IV SOLN: INTRAVENOUS | Status: DC

## 2022-07-16 MED ORDER — LIDOCAINE HCL (PF) 1 % IJ SOLN
INTRAMUSCULAR | Status: DC | PRN
  Administered 2022-07-16 (×2): 2 mL

## 2022-07-16 MED ORDER — SODIUM CHLORIDE 0.9% FLUSH: 3.0000 mL | Freq: Two times a day (BID) | INTRAVENOUS | Status: DC

## 2022-07-16 MED ORDER — SODIUM CHLORIDE 0.9% FLUSH: 3.0000 mL | INTRAVENOUS | Status: DC | PRN

## 2022-07-16 MED ORDER — HEPARIN (PORCINE) IN NACL 1000-0.9 UT/500ML-% IV SOLN
INTRAVENOUS | Status: AC
  Filled 2022-07-16: qty 1000

## 2022-07-16 MED ORDER — ONDANSETRON HCL 4 MG/2ML IJ SOLN: 4.0000 mg | Freq: Four times a day (QID) | INTRAMUSCULAR | Status: DC | PRN

## 2022-07-16 MED ORDER — ACETAMINOPHEN 325 MG PO TABS: 650.0000 mg | ORAL_TABLET | ORAL | Status: DC | PRN

## 2022-07-16 MED ORDER — IOHEXOL 300 MG/ML  SOLN
INTRAMUSCULAR | Status: DC | PRN
  Administered 2022-07-16: 45 mL

## 2022-07-16 MED ORDER — MIDAZOLAM HCL 2 MG/2ML IJ SOLN
INTRAMUSCULAR | Status: DC | PRN
  Administered 2022-07-16: 1 mg via INTRAVENOUS

## 2022-07-16 MED ORDER — VERAPAMIL HCL 2.5 MG/ML IV SOLN
INTRAVENOUS | Status: AC
  Filled 2022-07-16: qty 2

## 2022-07-16 MED ORDER — VERAPAMIL HCL 2.5 MG/ML IV SOLN
INTRAVENOUS | Status: DC | PRN
  Administered 2022-07-16: 2.5 mg via INTRAVENOUS

## 2022-07-16 MED ORDER — FENTANYL CITRATE (PF) 100 MCG/2ML IJ SOLN
INTRAMUSCULAR | Status: DC | PRN
  Administered 2022-07-16: 25 ug via INTRAVENOUS

## 2022-07-16 MED ORDER — HEPARIN (PORCINE) IN NACL 1000-0.9 UT/500ML-% IV SOLN
INTRAVENOUS | Status: DC | PRN
  Administered 2022-07-16 (×2): 500 mL

## 2022-07-16 MED ORDER — HEPARIN SODIUM (PORCINE) 1000 UNIT/ML IJ SOLN
INTRAMUSCULAR | Status: DC | PRN
  Administered 2022-07-16: 4000 [IU] via INTRAVENOUS

## 2022-07-16 SURGICAL SUPPLY — 12 items
BAND ZEPHYR COMPRESS 30 LONG (HEMOSTASIS) ×1 IMPLANT
CATH 5F 110X4 TIG (CATHETERS) ×1 IMPLANT
CATH BALLN WEDGE 5F 110CM (CATHETERS) ×1 IMPLANT
DRAPE BRACHIAL (DRAPES) ×2 IMPLANT
GLIDESHEATH SLEND SS 6F .021 (SHEATH) ×1 IMPLANT
GUIDEWIRE INQWIRE 1.5J.035X260 (WIRE) IMPLANT
INQWIRE 1.5J .035X260CM (WIRE) ×2
PACK CARDIAC CATH (CUSTOM PROCEDURE TRAY) ×2 IMPLANT
PROTECTION STATION PRESSURIZED (MISCELLANEOUS) ×2
SET ATX SIMPLICITY (MISCELLANEOUS) ×1 IMPLANT
SHEATH GLIDE SLENDER 4/5FR (SHEATH) ×1 IMPLANT
STATION PROTECTION PRESSURIZED (MISCELLANEOUS) IMPLANT

## 2022-07-16 NOTE — Interval H&P Note (Signed)
History and Physical Interval Note:  07/16/2022 9:41 AM  [name]  has presented today for surgery, with the diagnosis of R and L Cath   Heart failure  CAD.  The various methods of treatment have been discussed with the patient and family. After consideration of risks, benefits and other options for treatment, the patient has consented to  Procedure(s): RIGHT/LEFT HEART CATH AND CORONARY ANGIOGRAPHY (Bilateral) as a surgical intervention.  The patient's history has been reviewed, patient examined, no change in status, stable for surgery.  I have reviewed the patient's chart and labs.  Questions were answered to the patient's satisfaction.     Harold Parker

## 2022-07-17 ENCOUNTER — Telehealth: Payer: Self-pay | Admitting: Pulmonary Disease

## 2022-07-17 DIAGNOSIS — J449 Chronic obstructive pulmonary disease, unspecified: Secondary | ICD-10-CM

## 2022-07-17 NOTE — Telephone Encounter (Signed)
Spoke to patient.  He stated that he had angiography 07/16/2022. He would like Dr. Jayme Cloud to review report. He reports of persistent SOB. SOB is unchanged since last OV. He is aware that Dr. Jayme Cloud is out of office until 07/18/2022. He is okay with waiting on response.  He also wants to know Dr. Georgann Housekeeper thought on pulmonary hypertension clinic in [place]?  Dr. Jayme Cloud, please advise. Thanks

## 2022-07-18 ENCOUNTER — Other Ambulatory Visit: Payer: Self-pay | Admitting: Pulmonary Disease

## 2022-07-18 ENCOUNTER — Telehealth (HOSPITAL_COMMUNITY): Payer: Self-pay

## 2022-07-18 ENCOUNTER — Telehealth: Payer: Self-pay | Admitting: Cardiovascular Disease

## 2022-07-18 ENCOUNTER — Encounter (HOSPITAL_COMMUNITY): Payer: Self-pay

## 2022-07-18 NOTE — Telephone Encounter (Signed)
Called patient and informed him that we had him stop taking [place] as noted below:  Per note from [facility] on 07/05/22 No longer on Farxiga secondary to AKI. He remains on supplemental oxygen and has pulmonary follow-up next week.   Patient had picked up a refill at pharmacy yesterday and took one pill this morning. I advised not to take anymore and called the pharmacy to cancel the automatic refill. Patient was grateful for the callback.

## 2022-07-18 NOTE — Telephone Encounter (Signed)
Called to schedule appointment with Dr. Shirlee Latch

## 2022-07-18 NOTE — Telephone Encounter (Signed)
Patient states yesterday he received a call to pick up a prescription for farxiga.  He wants to know what the medication is and what the medication is for.

## 2022-07-19 NOTE — Telephone Encounter (Signed)
I have reviewed the results.  He has pulmonary hypertension which is due to many factors but 1 major factor is his COPD.  To make sure that there is not anything else going on would recommend some blood work to rule out things like rheumatoid arthritis, lupus etc.  The management for pulmonary hypertension due to COPD is to continue his medications for COPD as well as his oxygen.  If other issues are found those can be treated as well.  The pulmonary hypertension clinic in [place] is a good idea.

## 2022-07-19 NOTE — Telephone Encounter (Signed)
Patient is aware of below message/recommendations and voiced his understanding.  He agrees with plan.  Dr. Jayme Cloud, please clarify labs? Thanks.

## 2022-07-20 ENCOUNTER — Other Ambulatory Visit
Admission: RE | Admit: 2022-07-20 | Discharge: 2022-07-20 | Disposition: A | Discharge status: Home / Self Care | Payer: Medicare Other | Source: Ambulatory Visit | Attending: Pulmonary Disease | Admitting: Pulmonary Disease

## 2022-07-20 DIAGNOSIS — J449 Chronic obstructive pulmonary disease, unspecified: Secondary | ICD-10-CM | POA: Diagnosis present

## 2022-07-20 LAB — SEDIMENTATION RATE: Sed Rate: 7 mm/hr (ref 0–20)

## 2022-07-20 LAB — C-REACTIVE PROTEIN: CRP: 0.6 mg/dL (ref <1.0)

## 2022-07-20 NOTE — Telephone Encounter (Signed)
Discussed with Dr. Jayme Cloud verbally- labs have been ordered.  Nothing further needed

## 2022-07-21 LAB — RHEUMATOID FACTOR: Rheumatoid fact SerPl-aCnc: <10 IU/mL (ref <14.0)

## 2022-07-21 LAB — ANA W/REFLEX: Anti Nuclear Antibody (ANA): NEGATIVE

## 2022-08-07 ENCOUNTER — Ambulatory Visit: Payer: Medicare Other | Admitting: Physician Assistant

## 2022-08-17 ENCOUNTER — Ambulatory Visit: Payer: Medicare Other | Attending: Physician Assistant | Admitting: Physician Assistant

## 2022-08-17 ENCOUNTER — Encounter: Payer: Self-pay | Admitting: Physician Assistant

## 2022-08-17 VITALS — BP 132/70 | HR 93 | Ht 65.0 in | Wt 168.6 lb

## 2022-08-17 DIAGNOSIS — I2510 Atherosclerotic heart disease of native coronary artery without angina pectoris: Secondary | ICD-10-CM | POA: Diagnosis not present

## 2022-08-17 DIAGNOSIS — J9611 Chronic respiratory failure with hypoxia: Secondary | ICD-10-CM

## 2022-08-17 DIAGNOSIS — Z951 Presence of aortocoronary bypass graft: Secondary | ICD-10-CM | POA: Diagnosis not present

## 2022-08-17 DIAGNOSIS — I5022 Chronic systolic (congestive) heart failure: Secondary | ICD-10-CM

## 2022-08-17 DIAGNOSIS — Z953 Presence of xenogenic heart valve: Secondary | ICD-10-CM

## 2022-08-17 DIAGNOSIS — I255 Ischemic cardiomyopathy: Secondary | ICD-10-CM

## 2022-08-17 DIAGNOSIS — I272 Pulmonary hypertension, unspecified: Secondary | ICD-10-CM | POA: Diagnosis not present

## 2022-08-17 DIAGNOSIS — I739 Peripheral vascular disease, unspecified: Secondary | ICD-10-CM

## 2022-08-17 DIAGNOSIS — E785 Hyperlipidemia, unspecified: Secondary | ICD-10-CM

## 2022-08-17 DIAGNOSIS — I10 Essential (primary) hypertension: Secondary | ICD-10-CM

## 2022-08-17 NOTE — Patient Instructions (Signed)
Medication Instructions:  Your physician recommends that you continue on your current medications as directed. Please refer to the Current Medication list given to you today.  *If you need a refill on your cardiac medications before your next appointment, please call your pharmacy*   Lab Work: None If you have labs (blood work) drawn today and your tests are completely normal, you will receive your results only by: MyChart Message (if you have MyChart) OR A paper copy in the mail If you have any lab test that is abnormal or we need to change your treatment, we will call you to review the results.   Follow-Up: At [facility], you and your health needs are our priority.  As part of our continuing mission to provide you with exceptional heart care, we have created designated Provider Care Teams.  These Care Teams include your primary Cardiologist (physician) and Advanced Practice Providers (APPs -  Physician Assistants and Nurse Practitioners) who all work together to provide you with the care you need, when you need it.   Your next appointment:   6 week(s)  The format for your next appointment:   In Person  Provider:   Eula Listen, PA-C   Other Instructions Send [place] a MyChart message when you get home and let us know if you are taking the farxiga.  Important Information About Sugar

## 2022-08-17 NOTE — Progress Notes (Signed)
Cardiology Office Note    Date:  08/17/2022   ID:  Harold Parker, DOB 1946-05-17, MRN [id number]  PCP:  Lajean Manes, MD  Cardiologist:  Kathlyn Sacramento, MD  Electrophysiologist:  None   Chief Complaint: Follow-up  History of Present Illness:   Harold Parker is a 76 y.o. male with history of CAD status post CABG and bioprosthetic aortic valve replacement in 02/2018, HFrEF, pulmonary hypertension, PAD, DM2, HTN, HLD, GI bleed, and chronic hypoxic respiratory failure on supplemental oxygen, COVID, COPD with prior tobacco use who presents for follow-up of R/LHC.   He was admitted in 01/2018 with an NSTEMI.  LHC revealed severe left main stenosis in addition to significant disease involving the RCA and OM1.  EF was 40 to 45% with moderate aortic stenosis.  He subsequently underwent CABG and aortic valve replacement.  Postoperative course was notable for A-fib but that was controlled with amiodarone.  He was readmitted with an upper GI bleed with a hemoglobin of 4.7 with EGD demonstrating duodenal ulcers.     With regards to his PAD, lower extremity angiography in 02/2019, in the context of bilateral lower extremity claudication, showed no significant aortoiliac disease.  On the right, there was a long heavily calcified occlusion of the SFA with well-developed collaterals from the profunda and three-vessel runoff below the knee.  On the left, there was heavily calcified disease affecting the left SFA with three-vessel runoff below the knee.  He underwent successful orbital atherectomy and drug-coated balloon angioplasty to the left SFA.  Most recent noninvasive lower extremity imaging showed an improved ABI on the left with a patent left SFA.     He was admitted in 01/2021 with COVID-pneumonia and treated with steroids and remdesivir.  His oxygen requirement increased to 4 L.  He was readmitted later that same month with continued shortness of breath and again required treatment with steroids.  His  oxygen requirement could not be decreased below 4 L and this was established as his new baseline following COVID infection.  Echo performed during admission showed an EF of 40 to 45%, moderate pulmonary hypertension with a PASP of 58 mmHg, and mild mitral regurgitation.  He was admitted in 10/2021 with recurrent COVID-pneumonia complicated by acute on chronic respiratory failure and AKI with hyperkalemia.  Repeat echo showed an EF of 35 to 40% with moderately enlarged right ventricle and a normal functioning bioprosthetic aortic valve.  Work-up for DVT and PE was negative.  He was admitted in 12/2021 with acute on chronic hypoxic respiratory failure secondary to COPD and heart failure.  He was diuresed and given breathing treatments with improvement in symptoms.  He was seen in the office in 02/2022 and felt less short of breath and continued to require supplemental oxygen.  He was without symptoms of angina, decompensation, or claudication.  By our scale, his weight was down 3 pounds when compared to his visit in 11/2021.  Losartan was transitioned to Tangerine.  Based on labs at that time, there was concern for volume depletion with his furosemide being decreased back to 40 mg daily.  He was seen in the office on 04/02/2022 after having had his Lasix titrated to 40 mg twice daily alternating with 40 mg daily due to lower extremity swelling and noted an improvement in symptoms with this, along with the addition of compression stockings.  Swelling was felt to possibly be in the setting of saltier foods consumed over the Easter holiday.  His weight was down 2  pounds by our scale when compared to his prior clinic visit.  In an effort to optimize GDMT, amlodipine was discontinued and Entresto was titrated to 97/103 mg twice daily.   He was seen in the office on 05/17/2022 and was doing reasonably well from a cardiac perspective.  He did note some chronic mild musculoskeletal anterior chest wall pain that was worse over the  preceding weeks and reproducible to palpation.  He noted a prior prescription of prednisone as well as prior NSAID use helped with this discomfort.  He was started on Farxiga with continuation of Toprol-XL and Entresto.  Subsequent Echo on 06/08/2022, to evaluate his cardiomyopathy on maximally tolerated GDMT, demonstrated an EF of 35 to 40%, global hypokinesis with severe apical hypokinesis with spontaneous contrast in the apical region, findings consistent with volume overload, mild LVH, grade 2 diastolic dysfunction, severely reduced RV systolic function with severely enlarged RV cavity size, PASP 74.9 mmHg, mildly dilated left atrium, severely dilated right atrium, mild to moderate mitral regurgitation, mild to moderate tricuspid regurgitation, and an estimated right atrial pressure of 8 mmHg.  With these findings, after discussion with his primary cardiologist, furosemide was titrated, however with this he developed some AKI needing furosemide to be de-escalated to 20 mg twice daily.   He was admitted to the hospital in 06/2022 with acute on chronic hypoxic respiratory failure felt to be more related to COPD exacerbation over CHF.  During the admission, initial and peak high-sensitivity troponin of 19 with a delta troponin of 18.  BNP 829.  Chest x-ray showed no acute intrathoracic process.  CTA of the chest was negative for PE with moderate central lobar emphysema, aortic atherosclerosis, and dilated main pulmonary trunk consistent with pulmonary hypertension.  [name] was held at time of discharge.  He was last seen in the office on 07/05/2022 noting intermittent progressive dyspnea as well as chest tightness.  He did note the previously prescribed prednisone helped with this chest tightness as well as his breathing.  His weight is up 9 pounds when compared to his last clinic visit.  He was not on Farxiga secondary to AKI.    Renal artery ultrasound on 07/12/2022 was nondiagnostic.  R/LHC on 07/16/2022  demonstrated severe underlying three-vessel and left main CAD with patent grafts including LIMA to LAD, SVG to diagonal, and SVG to OM.  The RCA was small and known to be chronically occluded with left-to-right collaterals.  The bioprosthetic aortic valve was not crossed.  RHC showed normal right and left-sided filling pressures, severe pulmonary hypertension, and a mildly reduced cardiac output.  His coronary artery disease was stable and did not require revascularization.  His severe pulmonary hypertension did not seem to be related to left-sided heart failure, possibly related to intrinsic lung disease.  It was recommended he follow-up with pulmonology and was referred to the advanced heart failure clinic.  He comes in to note doing well from a cardiac perspective.  He is without symptoms of angina or cardiac decompensation.  Over the past couple of days, with improvement in the heat/humidity, he has noted an improvement in his baseline dyspnea.  He reports his dyspnea typically worsens during the hot summer months.  He remains on supplemental oxygen 4 L via nasal cannula.  No significant lower extremity swelling, abdominal distention, or orthopnea.  No dizziness, presyncope, or syncope.  He is adherent and tolerating cardiac medications without issues.  He is uncertain if he is currently taking [place].  When compared to his  last clinic visit in 06/2022, his weight is down 6 pounds.  No left radial arteriotomy site complications.  He has follow-up with the advanced heart failure clinic later this month.   Labs independently reviewed: 06/2022 - potassium 4.5, BUN 23, serum creatinine 1.05, Hgb 15.3, PLT 145, A1c 8.0 10/2021 - magnesium 1.8, A1c 6.8, albumin 3.9, AST normal, ALT 58 01/2021 - TSH normal 12/2020 - TC 90, TG 87, HDL 28, LDL 45  Past Medical History:  Diagnosis Date   Bilateral carotid bruits    a. 01/2018 U/S: < 50% bilat ICA stenosis.   CAD (coronary artery disease)    a. 1998 s/p MI and  BMS [place], [place]); b. 1999 redo PCI/rotablator in setting of what sounds like ISR;  c. Multiple stress tests over the years - last ~ 2017, reportedly nl; d. 01/2018 NSTEMI/Cath: LM 4m/d, LAD 50p, 40p/m, D1 60ost, OM1 95, RCA 100ost/p w/ L->R collats, EF 45%; e. s/p 3V CABG 02/19/18 (LIMA-LAD, VG-D1, VG-OM)   CHF (congestive heart failure) (HCC)    Chronic lower back pain    COPD (chronic obstructive pulmonary disease) ([place])    GIB (gastrointestinal bleeding)    a. 02/2018 GIB and anemia w/ Hgb of 4.7 on presentation; b. 03/2018 EGD: 2 nonbleeding duodenal ulcers.   HTN (hypertension)    Hypercholesteremia    Ischemic cardiomyopathy    a. 01/2018 Echo: EF 40-45%, mid-apicalanteroseptal, ant, apical sev HK, mod apicalinferior HK. Gr2 DD. Mod AS, mild MR, mod dil LA, PASP 43mmHg.   Moderate aortic stenosis    a. 01/2018 Echo: Mod AS, mean grad (S) 1mmHg, Valve area (VTI) 1.06 cm^2, (Vmax) 1.27 cm^2; b. 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   Myocardial infarction [facility]) ~ 1998/1999   S/P aortic valve replacement with bioprosthetic valve 02/19/2018   a. 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   LIMA to LAD, SVG to D1, SVG to OM, EVH via right thigh and leg   Tobacco abuse    Type II diabetes mellitus ([place])     Past Surgical History:  Procedure Laterality Date   ABDOMINAL AORTOGRAM N/A 03/04/2019   Procedure: ABDOMINAL AORTOGRAM;  Surgeon: Wellington Hampshire, MD;  Location: [place] [facility];  Service: Cardiovascular;  Laterality: N/A;   AORTIC VALVE REPLACEMENT N/A 02/19/2018   Procedure: AORTIC VALVE REPLACEMENT (AVR);  Surgeon: Rexene Alberts, MD;  Location: [place];  Service: Open Heart Surgery;  Laterality: N/A;   COLONOSCOPY     CORONARY ANGIOPLASTY WITH STENT PLACEMENT  ~ 1998/1999   CORONARY ARTERY BYPASS GRAFT N/A 02/19/2018   Procedure: CORONARY ARTERY BYPASS GRAFTING (CABG) x three , using left internal mammary artery and  right leg greater saphenous vein harvested endoscopically;  Surgeon: Rexene Alberts, MD;  Location: [place];  Service: Open Heart Surgery;  Laterality: N/A;   ESOPHAGOGASTRODUODENOSCOPY (EGD) WITH PROPOFOL N/A 03/18/2018   Procedure: ESOPHAGOGASTRODUODENOSCOPY (EGD) WITH PROPOFOL;  Surgeon: Lucilla Lame, MD;  Location: [facility];  Service: Endoscopy;  Laterality: N/A;   LEFT HEART CATH AND CORONARY ANGIOGRAPHY N/A 02/06/2018   Procedure: LEFT HEART CATH AND CORONARY ANGIOGRAPHY;  Surgeon: Wellington Hampshire, MD;  Location: [place] [facility];  Service: Cardiovascular;  Laterality: N/A;   LOWER EXTREMITY ANGIOGRAPHY Bilateral 03/04/2019   Procedure: Lower Extremity Angiography;  Surgeon: Wellington Hampshire, MD;  Location: [place] [facility];  Service: Cardiovascular;  Laterality: Bilateral;   PERIPHERAL VASCULAR ATHERECTOMY Left  03/04/2019   Procedure: PERIPHERAL VASCULAR ATHERECTOMY;  Surgeon: Iran Ouch, MD;  [facility];  Service: Cardiovascular;  Laterality: Left;  SFA   RIGHT/LEFT HEART CATH AND CORONARY ANGIOGRAPHY Bilateral 07/16/2022   Procedure: RIGHT/LEFT HEART CATH AND CORONARY ANGIOGRAPHY;  Surgeon: Iran Ouch, MD;  Location: [facility];  Service: Cardiovascular;  Laterality: Bilateral;   TEE WITHOUT CARDIOVERSION N/A 02/19/2018   Procedure: TRANSESOPHAGEAL ECHOCARDIOGRAM (TEE);  Surgeon: Purcell Nails, MD;  Location: [facility] OR;  Service: Open Heart Surgery;  Laterality: N/A;   TONSILLECTOMY      Current Medications: No outpatient medications have been marked as taking for the 08/17/22 encounter (Office Visit) with Sondra Barges, PA-C.    Allergies:   Atorvastatin and Atorvastatin calcium   Social History   Socioeconomic History   Marital status: Widowed    Spouse name: Not on file   Number of children: Not on file   Years of education: Not on file   Highest education level: Not on file  Occupational History   Not on file  Tobacco Use    Smoking status: Former    Packs/day: 1.00    Years: 40.00    Total pack years: 40.00    Types: Cigarettes    Quit date: 02/02/2018    Years since quitting: 4.5   Smokeless tobacco: Never   Tobacco comments:    Quit 01/2018 - on 05/01/2018 talked to [place] about [place] concerns. He ststaes that he has no taste for tobacco since he quit in Feb.  Vaping Use   Vaping Use: Never used  Substance and Sexual Activity   Alcohol use: Never    Comment: occ   Drug use: No   Sexual activity: Yes  Other Topics Concern   Not on file  Social History Narrative   Lives in [place] now (from [place]) with [name], life partner. 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     Social Determinants of Health   Financial Resource Strain: Not on file  Food Insecurity: Not on file  Transportation Needs: Not on file  Physical Activity: Not on file  Stress: Not on file  Social Connections: Not on file     Family History:  The patient's family history includes Lymphoma in his mother; Peripheral vascular disease in his father.  ROS:   12-point review of systems is negative unless otherwise noted in the HPI.   EKGs/Labs/Other Studies Reviewed:    Studies reviewed were summarized above. The additional studies were reviewed today:  R/LHC 07/16/2022:   Mid LM to Dist LM lesion is 85% stenosed.   Prox LAD lesion is 50% stenosed.   Ost RCA to Prox RCA lesion is 100% stenosed.   Ost 1st Diag lesion is 60% stenosed.   Ost 1st Mrg lesion is 95% stenosed.   Ost Cx to Prox Cx lesion is 100% stenosed.   Prox LAD to Mid LAD lesion is 100% stenosed.   SVG graft was visualized by angiography and is normal in caliber.   LIMA graft was visualized by non-selective angiography and is normal in caliber.   The graft exhibits no disease.   The graft exhibits no disease.   1.  Severe underlying three-vessel and left main coronary artery disease with patent grafts including LIMA to LAD, SVG to diagonal and SVG to  OM.  The RCA is small and known to be chronically occluded with left to right collaterals. 2.  The bioprosthetic aortic  valve was not crossed. 3.  Right heart catheterization showed normal right and left-sided filling pressures, severe pulmonary hypertension and mildly reduced cardiac output.   RA: 8 mmHg RV: 79/2 with an EDP of 14 mmHg PA: 79/23 with a mean of 44 mmHg PCW: 8 mmHg Cardiac output: 4.35 with an index of 2.37. Pulmonary vascular resistance: 8.2 [place] units.   Recommendations: The patient's coronary artery disease is stable and does not require revascularization given patent grafts. Continue medical therapy for chronic systolic heart failure.  He is euvolemic on small dose furosemide. The patient has severe pulmonary hypertension that does not seem to be due to left-sided heart failure.  This could be due to intrinsic lung disease.  Will discuss with pulmonary and refer the patient to the advanced heart failure clinic. __________  2D echo 06/08/2022: 1. Left ventricular ejection fraction, by estimation, is 35 to 40%. The  left ventricle has moderately decreased function. The left ventricle  demonstrates global hypokinesis with severe apical hypokinesis and  spontaneous contrast in the apical region.D  shaped septum systole and diastole consistent with RV volume and pressure  overload.   2. There is mild left ventricular hypertrophy. Left ventricular diastolic  parameters are consistent with Grade II diastolic dysfunction  (pseudonormalization). The average left ventricular global longitudinal  strain is 6.2 %. The global longitudinal  strain is abnormal.   3. Right ventricular systolic function is severely reduced. The right  ventricular size is severely enlarged. There is severely elevated  pulmonary artery systolic pressure. The estimated right ventricular  systolic pressure is [id number] mmHg.   4. Left atrial size was mildly dilated.   5. Right atrial size was severely  dilated.   6. The mitral valve is normal in structure. Mild to moderate mitral valve  regurgitation. No evidence of mitral stenosis.   7. Tricuspid valve regurgitation is mild to moderate.   8. The aortic valve was not well visualized. Aortic valve regurgitation  is not visualized. No aortic stenosis is present.   9. The inferior vena cava is dilated in size with >50% respiratory  variability, suggesting right atrial pressure of 8 mmHg. __________   ABIs 01/02/2022: ABI/TBIToday's ABIToday's TBIPrevious ABIPrevious TBI  +-------+-----------+-----------+------------+------------+  Right  0.82       0.34       0.73        0.36          +-------+-----------+-----------+------------+------------+  Left   0.84       0.38       0.74        not detected  +-------+-----------+-----------+------------+------------+  Bilateral ABIs appear increased compared to prior study on 09/20/20. Left  TBIs appear increased compared to prior study on 09/20/20.     Summary:  Right: Resting right ankle-brachial index indicates mild right lower  extremity arterial disease. The right toe-brachial index is abnormal.   Left: Resting left ankle-brachial index indicates mild left lower  extremity arterial disease. The left toe-brachial index is abnormal.     Suggest follow up study in 12 months.  __________   Arterial ultrasound 01/02/2022: Summary:  Left: 50-74% stenosis noted in the superficial femoral artery. Mild  progression is noted compared to previous study.   Suggest follow up study in 12 months.  __________   2D echo 11/01/2021: 1. Left ventricular ejection fraction, by estimation, is 35 to 40%. The  left ventricle has moderately decreased function. Left ventricular  endocardial border not optimally defined to evaluate regional wall motion.  There is mild left ventricular  hypertrophy. Left ventricular diastolic parameters are consistent with  Grade I diastolic dysfunction  (impaired relaxation).   2. Right ventricular systolic function was not well visualized. The right  ventricular size is moderately enlarged.   3. The mitral valve is normal in structure. No evidence of mitral valve  regurgitation.   4. The aortic valve was not well visualized. Aortic valve regurgitation  is not visualized. __________   2d echo 02/05/2021: 1. Left ventricular ejection fraction, by estimation, is 40 to 45%. The  left ventricle has mildly decreased function. Left ventricular endocardial  border not optimally defined to evaluate regional wall motion. Left  ventricular diastolic parameters are  consistent with Grade I diastolic dysfunction (impaired relaxation).   2. Right ventricular systolic function is normal. The right ventricular  size is normal. There is moderately elevated pulmonary artery systolic  pressure. The estimated right ventricular systolic pressure is 58.4 mmHg.   3. Left atrial size was mildly dilated.   4. The mitral valve is normal in structure. Mild mitral valve  regurgitation. No evidence of mitral stenosis.   5. Tricuspid valve regurgitation is moderate.   6. The aortic valve was not well visualized. Aortic valve regurgitation  is not visualized. No aortic stenosis is present.   7. The inferior vena cava is normal in size with greater than 50%  respiratory variability, suggesting right atrial pressure of 3 mmHg. __________   2D echo 09/20/2020: 1. Left ventricular ejection fraction, by estimation, is 45 to 50%. The  left ventricle has mildly decreased function. The left ventricle  demonstrates regional wall motion abnormalities (see scoring  diagram/findings for description). Left ventricular  diastolic parameters are consistent with Grade I diastolic dysfunction  (impaired relaxation). There is dyskinesis of the left ventricular, apical  segment.   2. Right ventricular systolic function is moderately reduced. The right  ventricular size is  normal.   3. Left atrial size was mildly dilated.   4. The mitral valve is normal in structure. Mild mitral valve  regurgitation.   5. The aortic valve was not well visualized. Aortic valve regurgitation  is not visualized. No aortic stenosis is present. There is a 25 mm Edwards  valve present in the aortic position. Procedure Date: 02/19/18. Aortic valve  mean gradient measures 9.0  mmHg.   6. Pulmonic valve regurgitation not well assessed.   7. The inferior vena cava is normal in size with greater than 50%  respiratory variability, suggesting right atrial pressure of 3 mmHg. __________   See Epic for remaining studies.   EKG:  EKG is ordered today.  The EKG ordered today demonstrates NSR, 93 bpm, RBBB  Recent Labs: 10/31/2021: ALT 58 11/06/2021: Magnesium 1.8 06/21/2022: B Natriuretic Peptide 829.9 07/05/2022: Hemoglobin 15.3; Platelets 145 07/13/2022: BUN 23; Creatinine, Ser 1.05; Potassium 4.5; Sodium 143  Recent Lipid Panel    Component Value Date/Time   CHOL 90 01/12/2021 0445   CHOL 102 03/27/2018 0900   TRIG 87 01/12/2021 0445   HDL 28 (L) 01/12/2021 0445   HDL 35 (L) 03/27/2018 0900   CHOLHDL 3.2 01/12/2021 0445   VLDL 17 01/12/2021 0445   LDLCALC 45 01/12/2021 0445   LDLCALC 50 03/27/2018 0900    PHYSICAL EXAM:    VS:  BP 132/70 (BP Location: Left Arm, Patient Position: Sitting, Cuff Size: Normal)   Pulse 93   Ht 5\' 5"  (1.651 m)   Wt 168 lb 9.6 oz (76.5 kg)   SpO2  92% Comment: 4 liters  BMI 28.06 kg/m   BMI: Body mass index is 28.06 kg/m.  Physical Exam Vitals reviewed.  Constitutional:      Appearance: He is well-developed.  HENT:     Head: Normocephalic and atraumatic.  Eyes:     General:        Right eye: No discharge.        Left eye: No discharge.  Neck:     Vascular: No JVD.  Cardiovascular:     Rate and Rhythm: Normal rate and regular rhythm.     Pulses:          Posterior tibial pulses are 2+ on the right side and 2+ on the left side.      Heart sounds: Normal heart sounds, S1 normal and S2 normal. Heart sounds not distant. No midsystolic click and no opening snap. No murmur heard.    No friction rub.     Comments: Left radial arteriotomy site is well-healed without active bleeding, swelling, warmth, erythema, or tenderness to palpation.  Radial pulse 2+ proximal and distal to the arteriotomy site. Pulmonary:     Effort: Pulmonary effort is normal. No respiratory distress.     Breath sounds: Normal breath sounds. No decreased breath sounds, wheezing or rales.  Chest:     Chest wall: No tenderness.  Abdominal:     General: There is no distension.  Musculoskeletal:     Cervical back: Normal range of motion.     Comments: Trivial bilateral pretibial edema.  Skin:    General: Skin is warm and dry.     Nails: There is no clubbing.  Neurological:     Mental Status: He is alert and oriented to person, place, and time.  Psychiatric:        Speech: Speech normal.        Behavior: Behavior normal.        Thought Content: Thought content normal.        Judgment: Judgment normal.     Wt Readings from Last 3 Encounters:  08/17/22 168 lb 9.6 oz (76.5 kg)  07/16/22 168 lb (76.2 kg)  07/09/22 170 lb (77.1 kg)     ASSESSMENT & PLAN:   CAD status post CABG without angina: He is doing well without symptoms concerning for angina or cardiac decompensation.  He did have some atypical chest discomfort after lifting something heavy.  Recent LHC showed severe native vessel CAD with patent grafts as outlined above.  Continue aggressive risk factor modification and secondary prevention including aspirin, ezetimibe, Toprol-XL, and rosuvastatin.  No arteriotomy site complications.  No indication for further ischemic testing at this time.  HFrEF secondary to ICM: NYHA class III symptoms.  Recent R/LHC showed normal left and right-sided filling pressures with euvolemia and severe pulmonary hypertension.  Continue current GDMT including Entresto,  Toprol-XL, and furosemide.  We will plan to rechallenge him with Wilder Glade if he is not currently taking this medication (he is uncertain).  He will send [place] a MyChart message later today to update [place].  Not currently on a spironolactone given recent renal dysfunction.  Severe pulmonary hypertension: Does not appear to be related to left-sided heart failure.  Likely more pulmonary in etiology.  Follow-up with the advanced heart failure service and pulmonology.  Aortic valve disease status post bioprosthetic AVR: He remains on aspirin.  Most recent echo showed normal functioning bioprosthetic aortic valve.  SBE prophylaxis is indicated.  PAD: No symptoms of lifestyle limiting claudication  or evidence of nonhealing wounds.  Most recent noninvasive lower extremity imaging showed improved ABI on the left with patent left SFA stent.  This was not discussed in detail today.  He remains on aspirin and statin.  HTN: Blood pressure is well controlled in the office today.  Continue medical therapy as outlined above.  HLD: LDL 45 with a triglyceride of 87.  He remains on rosuvastatin and ezetimibe.  Chronic hypoxic respiratory failure: Multifactorial.  Stable.  He remains on supplemental oxygen via nasal cannula at 4 L.  Follow-up with pulmonology as directed.    Disposition: F/u with Dr. Fletcher Anon or an APP in 6 weeks.   Medication Adjustments/Labs and Tests Ordered: Current medicines are reviewed at length with the patient today.  Concerns regarding medicines are outlined above. Medication changes, Labs and Tests ordered today are summarized above and listed in the Patient Instructions accessible in Encounters.   Signed, Christell Faith, PA-C 08/17/2022 11:24 AM     [place] [address] [place] [address] [place], [place] [address] [phone]

## 2022-08-19 ENCOUNTER — Emergency Department (HOSPITAL_COMMUNITY): Payer: Medicare Other

## 2022-08-19 ENCOUNTER — Inpatient Hospital Stay (HOSPITAL_COMMUNITY)
Admission: EM | Admit: 2022-08-19 | Discharge: 2022-09-16 | DRG: 208 | Disposition: E | Payer: Medicare Other | Attending: Internal Medicine | Admitting: Internal Medicine

## 2022-08-19 ENCOUNTER — Encounter (HOSPITAL_COMMUNITY): Payer: Self-pay

## 2022-08-19 DIAGNOSIS — Z8711 Personal history of peptic ulcer disease: Secondary | ICD-10-CM

## 2022-08-19 DIAGNOSIS — I252 Old myocardial infarction: Secondary | ICD-10-CM

## 2022-08-19 DIAGNOSIS — L89321 Pressure ulcer of left buttock, stage 1: Secondary | ICD-10-CM | POA: Diagnosis present

## 2022-08-19 DIAGNOSIS — J9621 Acute and chronic respiratory failure with hypoxia: Secondary | ICD-10-CM | POA: Diagnosis present

## 2022-08-19 DIAGNOSIS — R0902 Hypoxemia: Secondary | ICD-10-CM | POA: Diagnosis present

## 2022-08-19 DIAGNOSIS — F410 Panic disorder [episodic paroxysmal anxiety] without agoraphobia: Secondary | ICD-10-CM | POA: Diagnosis not present

## 2022-08-19 DIAGNOSIS — L89311 Pressure ulcer of right buttock, stage 1: Secondary | ICD-10-CM | POA: Diagnosis present

## 2022-08-19 DIAGNOSIS — E1169 Type 2 diabetes mellitus with other specified complication: Secondary | ICD-10-CM | POA: Diagnosis present

## 2022-08-19 DIAGNOSIS — Z79899 Other long term (current) drug therapy: Secondary | ICD-10-CM

## 2022-08-19 DIAGNOSIS — Z8249 Family history of ischemic heart disease and other diseases of the circulatory system: Secondary | ICD-10-CM

## 2022-08-19 DIAGNOSIS — I255 Ischemic cardiomyopathy: Secondary | ICD-10-CM | POA: Diagnosis present

## 2022-08-19 DIAGNOSIS — Z7984 Long term (current) use of oral hypoglycemic drugs: Secondary | ICD-10-CM

## 2022-08-19 DIAGNOSIS — J440 Chronic obstructive pulmonary disease with acute lower respiratory infection: Secondary | ICD-10-CM | POA: Diagnosis present

## 2022-08-19 DIAGNOSIS — Z955 Presence of coronary angioplasty implant and graft: Secondary | ICD-10-CM | POA: Diagnosis not present

## 2022-08-19 DIAGNOSIS — E1165 Type 2 diabetes mellitus with hyperglycemia: Secondary | ICD-10-CM | POA: Diagnosis present

## 2022-08-19 DIAGNOSIS — Z7951 Long term (current) use of inhaled steroids: Secondary | ICD-10-CM

## 2022-08-19 DIAGNOSIS — I5022 Chronic systolic (congestive) heart failure: Secondary | ICD-10-CM | POA: Diagnosis not present

## 2022-08-19 DIAGNOSIS — I272 Pulmonary hypertension, unspecified: Secondary | ICD-10-CM | POA: Diagnosis present

## 2022-08-19 DIAGNOSIS — I152 Hypertension secondary to endocrine disorders: Secondary | ICD-10-CM | POA: Diagnosis present

## 2022-08-19 DIAGNOSIS — Z807 Family history of other malignant neoplasms of lymphoid, hematopoietic and related tissues: Secondary | ICD-10-CM

## 2022-08-19 DIAGNOSIS — J189 Pneumonia, unspecified organism: Principal | ICD-10-CM | POA: Diagnosis present

## 2022-08-19 DIAGNOSIS — R0602 Shortness of breath: Principal | ICD-10-CM

## 2022-08-19 DIAGNOSIS — I2510 Atherosclerotic heart disease of native coronary artery without angina pectoris: Secondary | ICD-10-CM | POA: Diagnosis present

## 2022-08-19 DIAGNOSIS — R001 Bradycardia, unspecified: Secondary | ICD-10-CM | POA: Diagnosis not present

## 2022-08-19 DIAGNOSIS — I214 Non-ST elevation (NSTEMI) myocardial infarction: Secondary | ICD-10-CM | POA: Diagnosis not present

## 2022-08-19 DIAGNOSIS — I5042 Chronic combined systolic (congestive) and diastolic (congestive) heart failure: Secondary | ICD-10-CM | POA: Diagnosis present

## 2022-08-19 DIAGNOSIS — Z9981 Dependence on supplemental oxygen: Secondary | ICD-10-CM

## 2022-08-19 DIAGNOSIS — I452 Bifascicular block: Secondary | ICD-10-CM | POA: Diagnosis present

## 2022-08-19 DIAGNOSIS — R092 Respiratory arrest: Secondary | ICD-10-CM | POA: Diagnosis not present

## 2022-08-19 DIAGNOSIS — I248 Other forms of acute ischemic heart disease: Secondary | ICD-10-CM | POA: Diagnosis present

## 2022-08-19 DIAGNOSIS — E7800 Pure hypercholesterolemia, unspecified: Secondary | ICD-10-CM | POA: Diagnosis present

## 2022-08-19 DIAGNOSIS — J449 Chronic obstructive pulmonary disease, unspecified: Secondary | ICD-10-CM | POA: Diagnosis not present

## 2022-08-19 DIAGNOSIS — Z888 Allergy status to other drugs, medicaments and biological substances status: Secondary | ICD-10-CM

## 2022-08-19 DIAGNOSIS — N289 Disorder of kidney and ureter, unspecified: Secondary | ICD-10-CM

## 2022-08-19 DIAGNOSIS — I468 Cardiac arrest due to other underlying condition: Secondary | ICD-10-CM | POA: Diagnosis not present

## 2022-08-19 DIAGNOSIS — F419 Anxiety disorder, unspecified: Secondary | ICD-10-CM | POA: Diagnosis present

## 2022-08-19 DIAGNOSIS — Z20822 Contact with and (suspected) exposure to covid-19: Secondary | ICD-10-CM | POA: Diagnosis present

## 2022-08-19 DIAGNOSIS — I5043 Acute on chronic combined systolic (congestive) and diastolic (congestive) heart failure: Secondary | ICD-10-CM | POA: Diagnosis not present

## 2022-08-19 DIAGNOSIS — Z7982 Long term (current) use of aspirin: Secondary | ICD-10-CM

## 2022-08-19 DIAGNOSIS — L8990 Pressure ulcer of unspecified site, unspecified stage: Secondary | ICD-10-CM | POA: Insufficient documentation

## 2022-08-19 DIAGNOSIS — Z87891 Personal history of nicotine dependence: Secondary | ICD-10-CM

## 2022-08-19 DIAGNOSIS — J989 Respiratory disorder, unspecified: Secondary | ICD-10-CM | POA: Diagnosis not present

## 2022-08-19 DIAGNOSIS — I110 Hypertensive heart disease with heart failure: Secondary | ICD-10-CM | POA: Diagnosis not present

## 2022-08-19 DIAGNOSIS — E1159 Type 2 diabetes mellitus with other circulatory complications: Secondary | ICD-10-CM | POA: Diagnosis present

## 2022-08-19 DIAGNOSIS — Z951 Presence of aortocoronary bypass graft: Secondary | ICD-10-CM | POA: Diagnosis not present

## 2022-08-19 LAB — COMPREHENSIVE METABOLIC PANEL
ALT: 18 U/L (ref 0–44)
AST: 22 U/L (ref 15–41)
Albumin: 3.3 g/dL — ABNORMAL LOW (ref 3.5–5.0)
Alkaline Phosphatase: 64 U/L (ref 38–126)
Anion gap: 11 (ref 5–15)
BUN: 19 mg/dL (ref 8–23)
CO2: 23 mmol/L (ref 22–32)
Calcium: 9.3 mg/dL (ref 8.9–10.3)
Chloride: 107 mmol/L (ref 98–111)
Creatinine, Ser: 1.16 mg/dL (ref 0.61–1.24)
GFR, Estimated: >60 mL/min (ref >60)
Glucose, Bld: 151 mg/dL — ABNORMAL HIGH (ref 70–99)
Potassium: 3.7 mmol/L (ref 3.5–5.1)
Sodium: 141 mmol/L (ref 135–145)
Total Bilirubin: 1.3 mg/dL — ABNORMAL HIGH (ref 0.3–1.2)
Total Protein: 5.9 g/dL — ABNORMAL LOW (ref 6.5–8.1)

## 2022-08-19 LAB — CBC WITH DIFFERENTIAL/PLATELET
Abs Immature Granulocytes: 0.06 10*3/uL (ref 0.00–0.07)
Basophils Absolute: 0.1 10*3/uL (ref 0.0–0.1)
Basophils Relative: 1 %
Eosinophils Absolute: 0.2 10*3/uL (ref 0.0–0.5)
Eosinophils Relative: 2 %
HCT: 44.3 % (ref 39.0–52.0)
Hemoglobin: 15.4 g/dL (ref 13.0–17.0)
Immature Granulocytes: 1 %
Lymphocytes Relative: 11 %
Lymphs Abs: 1.4 10*3/uL (ref 0.7–4.0)
MCH: 30.2 pg (ref 26.0–34.0)
MCHC: 34.8 g/dL (ref 30.0–36.0)
MCV: 86.9 fL (ref 80.0–100.0)
Monocytes Absolute: 0.5 10*3/uL (ref 0.1–1.0)
Monocytes Relative: 4 %
Neutro Abs: 11 10*3/uL — ABNORMAL HIGH (ref 1.7–7.7)
Neutrophils Relative %: 81 %
Platelets: 180 10*3/uL (ref 150–400)
RBC: 5.1 MIL/uL (ref 4.22–5.81)
RDW: 14.5 % (ref 11.5–15.5)
WBC: 13.3 10*3/uL — ABNORMAL HIGH (ref 4.0–10.5)
nRBC: 0 % (ref 0.0–0.2)

## 2022-08-19 LAB — GLUCOSE, CAPILLARY: Glucose-Capillary: 158 mg/dL — ABNORMAL HIGH (ref 70–99)

## 2022-08-19 LAB — TROPONIN I (HIGH SENSITIVITY)
Troponin I (High Sensitivity): 62 ng/L — ABNORMAL HIGH (ref <18)
Troponin I (High Sensitivity): 174 ng/L (ref <18)

## 2022-08-19 LAB — SARS CORONAVIRUS 2 BY RT PCR: SARS Coronavirus 2 by RT PCR: NEGATIVE

## 2022-08-19 LAB — LACTIC ACID, PLASMA: Lactic Acid, Venous: 1.6 mmol/L (ref 0.5–1.9)

## 2022-08-19 LAB — BRAIN NATRIURETIC PEPTIDE: B Natriuretic Peptide: 788.5 pg/mL — ABNORMAL HIGH (ref 0.0–100.0)

## 2022-08-19 MED ORDER — ARFORMOTEROL TARTRATE 15 MCG/2ML IN NEBU
15.0000 ug | INHALATION_SOLUTION | Freq: Two times a day (BID) | RESPIRATORY_TRACT | Status: DC
  Administered 2022-08-19 – 2022-08-20 (×2): 15 ug via RESPIRATORY_TRACT
  Filled 2022-08-19 (×2): qty 2

## 2022-08-19 MED ORDER — IOHEXOL 350 MG/ML SOLN
93.0000 mL | Freq: Once | INTRAVENOUS | Status: AC | PRN
  Administered 2022-08-19: 93 mL via INTRAVENOUS

## 2022-08-19 MED ORDER — ALBUTEROL SULFATE (2.5 MG/3ML) 0.083% IN NEBU
2.5000 mg | INHALATION_SOLUTION | Freq: Four times a day (QID) | RESPIRATORY_TRACT | Status: DC | PRN
Start: 2022-08-19 — End: 2022-08-20

## 2022-08-19 MED ORDER — INSULIN ASPART 100 UNIT/ML IJ SOLN
0.0000 [IU] | Freq: Three times a day (TID) | INTRAMUSCULAR | Status: DC
  Administered 2022-08-20: 1 [IU] via SUBCUTANEOUS

## 2022-08-19 MED ORDER — INSULIN ASPART 100 UNIT/ML IJ SOLN: 0.0000 [IU] | Freq: Every day | INTRAMUSCULAR | Status: DC

## 2022-08-19 MED ORDER — DAPAGLIFLOZIN PROPANEDIOL 10 MG PO TABS
10.0000 mg | ORAL_TABLET | Freq: Every morning | ORAL | Status: DC
  Administered 2022-08-20: 10 mg via ORAL
  Filled 2022-08-19: qty 1

## 2022-08-19 MED ORDER — BUDESONIDE 0.5 MG/2ML IN SUSP
0.5000 mg | Freq: Two times a day (BID) | RESPIRATORY_TRACT | Status: DC
  Administered 2022-08-19 – 2022-08-20 (×2): 0.5 mg via RESPIRATORY_TRACT
  Filled 2022-08-19 (×2): qty 2

## 2022-08-19 MED ORDER — ROSUVASTATIN CALCIUM 5 MG PO TABS
10.0000 mg | ORAL_TABLET | Freq: Every day | ORAL | Status: DC
  Administered 2022-08-20: 10 mg via ORAL
  Filled 2022-08-19: qty 2

## 2022-08-19 MED ORDER — SODIUM CHLORIDE 0.9 % IV SOLN
1.0000 g | INTRAVENOUS | Status: DC
Start: 2022-08-19 — End: 2022-08-19
  Administered 2022-08-19: 1 g via INTRAVENOUS
  Filled 2022-08-19: qty 10

## 2022-08-19 MED ORDER — SODIUM CHLORIDE 0.9 % IV SOLN
2.0000 g | INTRAVENOUS | Status: DC
  Administered 2022-08-20: 2 g via INTRAVENOUS
  Filled 2022-08-19: qty 20

## 2022-08-19 MED ORDER — ENOXAPARIN SODIUM 40 MG/0.4ML IJ SOSY
40.0000 mg | PREFILLED_SYRINGE | INTRAMUSCULAR | Status: DC
  Administered 2022-08-19: 40 mg via SUBCUTANEOUS
  Filled 2022-08-19: qty 0.4

## 2022-08-19 MED ORDER — FUROSEMIDE 20 MG PO TABS
20.0000 mg | ORAL_TABLET | Freq: Every day | ORAL | Status: DC
  Administered 2022-08-20: 20 mg via ORAL
  Filled 2022-08-19: qty 1

## 2022-08-19 MED ORDER — SACUBITRIL-VALSARTAN 24-26 MG PO TABS
1.0000 | ORAL_TABLET | Freq: Two times a day (BID) | ORAL | Status: DC
  Administered 2022-08-20: 1 via ORAL
  Filled 2022-08-19: qty 1

## 2022-08-19 MED ORDER — ALPRAZOLAM 0.5 MG PO TABS
0.5000 mg | ORAL_TABLET | Freq: Two times a day (BID) | ORAL | Status: DC
  Administered 2022-08-19 – 2022-08-20 (×2): 0.5 mg via ORAL
  Filled 2022-08-19 (×2): qty 1

## 2022-08-19 MED ORDER — BUSPIRONE HCL 10 MG PO TABS
10.0000 mg | ORAL_TABLET | Freq: Two times a day (BID) | ORAL | Status: DC
  Administered 2022-08-20: 10 mg via ORAL
  Filled 2022-08-19: qty 1

## 2022-08-19 MED ORDER — GUAIFENESIN ER 600 MG PO TB12
1200.0000 mg | ORAL_TABLET | Freq: Two times a day (BID) | ORAL | Status: DC | PRN
  Administered 2022-08-20: 1200 mg via ORAL
  Filled 2022-08-19: qty 2

## 2022-08-19 MED ORDER — REVEFENACIN 175 MCG/3ML IN SOLN
175.0000 ug | Freq: Every day | RESPIRATORY_TRACT | Status: DC
Start: 2022-08-20 — End: 2022-08-20
  Administered 2022-08-20: 175 ug via RESPIRATORY_TRACT
  Filled 2022-08-19: qty 3

## 2022-08-19 MED ORDER — EZETIMIBE 10 MG PO TABS: 10.0000 mg | ORAL_TABLET | Freq: Every day | ORAL | Status: DC

## 2022-08-19 MED ORDER — ONDANSETRON HCL 4 MG/2ML IJ SOLN: 4.0000 mg | Freq: Four times a day (QID) | INTRAMUSCULAR | Status: DC | PRN

## 2022-08-19 MED ORDER — ASPIRIN 81 MG PO TBEC
81.0000 mg | DELAYED_RELEASE_TABLET | Freq: Every day | ORAL | Status: DC
  Administered 2022-08-20: 81 mg via ORAL
  Filled 2022-08-19: qty 1

## 2022-08-19 MED ORDER — ACETAMINOPHEN 650 MG RE SUPP: 650.0000 mg | Freq: Four times a day (QID) | RECTAL | Status: DC | PRN

## 2022-08-19 MED ORDER — ONDANSETRON HCL 4 MG PO TABS: 4.0000 mg | ORAL_TABLET | Freq: Four times a day (QID) | ORAL | Status: DC | PRN

## 2022-08-19 MED ORDER — SODIUM CHLORIDE 0.9% FLUSH
3.0000 mL | Freq: Two times a day (BID) | INTRAVENOUS | Status: DC
  Administered 2022-08-19 – 2022-08-20 (×2): 3 mL via INTRAVENOUS

## 2022-08-19 MED ORDER — SODIUM CHLORIDE 0.9 % IV SOLN
500.0000 mg | INTRAVENOUS | Status: DC
  Administered 2022-08-20: 500 mg via INTRAVENOUS
  Filled 2022-08-19: qty 5

## 2022-08-19 MED ORDER — METOPROLOL SUCCINATE ER 50 MG PO TB24
50.0000 mg | ORAL_TABLET | Freq: Every day | ORAL | Status: DC
  Administered 2022-08-20: 50 mg via ORAL
  Filled 2022-08-19: qty 1

## 2022-08-19 MED ORDER — SODIUM CHLORIDE 0.9 % IV SOLN
500.0000 mg | Freq: Once | INTRAVENOUS | Status: AC
  Administered 2022-08-19: 500 mg via INTRAVENOUS
  Filled 2022-08-19: qty 5

## 2022-08-19 MED ORDER — ACETAMINOPHEN 325 MG PO TABS: 650.0000 mg | ORAL_TABLET | Freq: Four times a day (QID) | ORAL | Status: DC | PRN

## 2022-08-19 NOTE — Assessment & Plan Note (Signed)
Continue home Xanax 0.5 mg twice daily and BuSpar.

## 2022-08-19 NOTE — Assessment & Plan Note (Addendum)
Currently denies any chest pain.  Troponin trending up 62 > 174.  EKG shows sinus tachycardia without acute ischemic changes.  Underwent recent right/left heart cath 07/16/2022 which showed severe underlying three-vessel and left main CAD with patent grafts.  Suspect type II demand ischemia in setting of acute on chronic hypoxic respiratory failure. -Trend troponin -If continuing to rise will consider IV heparin -Continue aspirin, rosuvastatin, Zetia

## 2022-08-19 NOTE — ED Provider Notes (Signed)
This patient is a 76 year old male, he is chronically ill with multiple medical problems including chronic coronary disease status post bypass grafting, he has history of pulmonary hypertension which was quite severe with elevated pulmonary artery pressures thought to be more pulmonary related.  He has been referred to pulmonology but it is not clear that he has seen them.  He is on 4 L of nasal cannula chronically.  He was actually seen in the cardiology office 2 days ago and was doing well.  He is compliant with his medications according to his report.  This morning he became acutely short of breath with worsening chest pain which has been going on all day.  Nothing seems to make it better or worse, it is quite severe, the paramedics noted the patient to be very tachycardic with a rate of around 140 bpm with a right bundle blanch block.  A code STEMI was activated prehospital but after I saw the EKG and discussed it with cardiology Dr. Eldridge Dace it was clear that this was not a STEMI and this was downgraded.  The patient is having active chest pain, on my exam his heart rate is lower down to around 115 bpm, no obvious murmurs, lungs are relatively clear, he appears tachypneic and in respiratory distress, he does have slight asymmetry of his legs with pretibial edema right greater than left.   EKG Interpretation  Date/Time:  Sunday August 23, 2022 17:07:22 EDT Ventricular Rate:  133 PR Interval:  105 QRS Duration: 181 QT Interval:  420 QTC Calculation: 625 R Axis:   130 Text Interpretation: Sinus tachycardia RBBB and LPFB Since last tracing rate faster Confirmed by Eber Hong ([id number]) on Aug 23, 2022 7:33:18 PM       .Critical Care  Performed by: Eber Hong, MD Authorized by: Eber Hong, MD   Critical care provider statement:    Critical care time (minutes):  30   Critical care time was exclusive of:  Separately billable procedures and treating other patients and teaching time   Critical  care was necessary to treat or prevent imminent or life-threatening deterioration of the following conditions:  Cardiac failure and respiratory failure   Critical care was time spent personally by me on the following activities:  Development of treatment plan with patient or surrogate, discussions with consultants, evaluation of patient's response to treatment, examination of patient, ordering and review of laboratory studies, ordering and review of radiographic studies, ordering and performing treatments and interventions, pulse oximetry, re-evaluation of patient's condition, review of old charts and obtaining history from patient or surrogate   I assumed direction of critical care for this patient from another provider in my specialty: no     Care discussed with: admitting provider   Comments:          Final diagnoses:  Shortness of breath  Hypoxia  Pneumonia due to infectious organism, unspecified laterality, unspecified part of lung      Eber Hong, MD 08/29/2022 2032

## 2022-08-19 NOTE — Assessment & Plan Note (Addendum)
Wears 4 L O2 via [place] at all times at baseline.  Requiring NRB on admission. -Wean supplemental O2 as able -Use BiPAP if needed

## 2022-08-19 NOTE — ED Provider Notes (Signed)
[facility] EMERGENCY DEPARTMENT Provider Note   CSN: [id number] Arrival date & time: 09/03/2022  1655     History  Chief Complaint  Patient presents with   Chest Pain   Shortness of Breath    Harold Parker is a 76 y.o. male. Has medical history of chronic CAD s/p bypass, pulmonary hypertension, CHF, and COPD presenting with chest pain and shortness of breath onset this morning and worsening throughout the day.  Denies fever.  Has had some cough.  Initially felt similar to his COPD exacerbation, but was not improved with his reading treatment at home.  He then called EMS.  Denies nausea, vomiting, or abdominal pain.  Chest Pain Associated symptoms: shortness of breath   Associated symptoms: no abdominal pain, no back pain, no cough, no fever, no palpitations and no vomiting   Shortness of Breath Associated symptoms: chest pain   Associated symptoms: no abdominal pain, no cough, no ear pain, no fever, no rash, no sore throat and no vomiting        Home Medications Prior to Admission medications   Medication Sig Start Date End Date Taking? Authorizing Provider  acetaminophen (TYLENOL) 500 MG tablet Take 1,000 mg by mouth daily as needed for moderate pain or headache.    [provider]  albuterol (PROVENTIL) (2.5 MG/3ML) 0.083% nebulizer solution TAKE BY NEBULIZATION EVERY 6 HOURS AS NEEDED FOR WHEEZING OR SHORTNESS OF BREATH 07/19/22   Salena Saner, MD  ALPRAZolam Prudy Feeler) 0.5 MG tablet Take 0.5 mg by mouth 2 (two) times daily.    [provider]  aspirin EC 81 MG tablet Take 81 mg by mouth daily.    [provider]  budesonide (PULMICORT) 0.5 MG/2ML nebulizer solution TAKE BY NEBULIZATION TWICE DAILY USE AFTER PERFOROMIST 02/07/22   Salena Saner, MD  busPIRone (BUSPAR) 10 MG tablet Take 10 mg by mouth 2 (two) times daily. 04/15/21   [provider]  cholecalciferol (VITAMIN D3) 25 MCG (1000 UNIT) tablet Take  4,000 Units by mouth daily.    [provider]  esomeprazole (NEXIUM) 40 MG capsule Take 40 mg by mouth at bedtime.     [provider]  ezetimibe (ZETIA) 10 MG tablet Take 10 mg by mouth at bedtime.     [provider]  FARXIGA 10 MG TABS tablet Take 10 mg by mouth every morning. 07/16/22   [provider]  formoterol (PERFOROMIST) 20 MCG/2ML nebulizer solution TAKE 2 MLS BY MOUTH BY NEBULIZATION TWICE DAILY 02/05/22   Salena Saner, MD  furosemide (LASIX) 20 MG tablet Take 1 tablet (20 mg total) by mouth daily. 06/18/22 09/16/22  Dunn, Raymon Mutton, PA-C  glipiZIDE (GLUCOTROL) 5 MG tablet Take 1 tablet (5 mg total) by mouth 2 (two) times daily before a meal. Take 5 mg in the morning and take a second 5 mg dose at night on Mon, Wed, and Fri Patient taking differently: Take 5 mg by mouth 2 (two) times daily before a meal. 12/25/21   Leeroy Bock, MD  guaiFENesin (MUCINEX) 600 MG 12 hr tablet Take 1,200 mg by mouth every 12 (twelve) hours as needed. 01/23/21   [provider]  meclizine (ANTIVERT) 25 MG tablet Take 1 tablet (25 mg total) by mouth 3 (three) times daily as needed for dizziness. 06/24/22   Marrion Coy, MD  metFORMIN (GLUCOPHAGE) 1000 MG tablet TAKE 1 TABLET BY MOUTH TWICE DAILY WITH A MEAL 04/27/18   Lovett Sox,  MD  metoprolol succinate (TOPROL-XL) 50 MG 24 hr tablet TAKE 1 TABLET BY MOUTH EVERY DAY 02/09/22   Iran Ouch, MD  Nebulizers (COMPRESSOR/NEBULIZER) MISC Use as directed 10/26/21   Salena Saner, MD  PROAIR HFA 108 [phone] Base) MCG/ACT inhaler Inhale 1 puff into the lungs every 4 (four) hours as needed for wheezing or shortness of breath. 02/04/19   [provider]  revefenacin (YUPELRI) 175 MCG/3ML nebulizer solution Take 3 mLs (175 mcg total) by nebulization daily. 03/06/22   Salena Saner, MD  rosuvastatin (CRESTOR) 10 MG tablet TAKE 1 TABLET BY MOUTH EVERY DAY 03/01/22   Iran Ouch, MD   sacubitril-valsartan (ENTRESTO) 24-26 MG Take 1 tablet by mouth 2 (two) times daily. Patient not taking: Reported on 07/16/2022 07/06/22   Sondra Barges, PA-C  traZODone (DESYREL) 50 MG tablet Take 50 mg by mouth at bedtime as needed. 02/09/21   [provider]      Allergies    Atorvastatin and Atorvastatin calcium    Review of Systems   Review of Systems  Constitutional:  Negative for chills and fever.  HENT:  Negative for ear pain and sore throat.   Eyes:  Negative for pain and visual disturbance.  Respiratory:  Positive for shortness of breath. Negative for cough.   Cardiovascular:  Positive for chest pain and leg swelling (Right lower extremity). Negative for palpitations.  Gastrointestinal:  Negative for abdominal pain and vomiting.  Genitourinary:  Negative for dysuria and hematuria.  Musculoskeletal:  Negative for arthralgias and back pain.  Skin:  Negative for color change and rash.  Neurological:  Negative for seizures and syncope.  All other systems reviewed and are negative.   Physical Exam Updated Vital Signs BP 134/72 (BP Location: Left Arm)   Pulse (!) 123   Temp 98.3 F (36.8 C) (Oral)   Resp 20   Ht 5\' 5"  (1.651 m)   Wt 75.3 kg   SpO2 97%   BMI 27.62 kg/m  Physical Exam Vitals and nursing note reviewed.  Constitutional:      General: He is in acute distress.     Appearance: He is well-developed. He is ill-appearing and diaphoretic.  HENT:     Head: Normocephalic and atraumatic.  Eyes:     Conjunctiva/sclera: Conjunctivae normal.  Cardiovascular:     Rate and Rhythm: Regular rhythm. Tachycardia present.     Heart sounds: No murmur heard. Pulmonary:     Effort: Pulmonary effort is normal. Tachypnea present. No respiratory distress.     Breath sounds: Normal breath sounds.  Abdominal:     Palpations: Abdomen is soft.     Tenderness: There is no abdominal tenderness.  Musculoskeletal:        General: No swelling.     Cervical back: Neck  supple.     Right lower leg: Edema present.     Left lower leg: No edema.  Skin:    General: Skin is warm.     Capillary Refill: Capillary refill takes less than 2 seconds.  Neurological:     Mental Status: He is alert.  Psychiatric:        Mood and Affect: Mood normal.     ED Results / Procedures / Treatments   Labs (all labs ordered are listed, but only abnormal results are displayed) Labs Reviewed  CBC WITH DIFFERENTIAL/PLATELET - Abnormal; Notable for the following components:      Result Value   WBC 13.3 (*)  Neutro Abs 11.0 (*)    All other components within normal limits  COMPREHENSIVE METABOLIC PANEL - Abnormal; Notable for the following components:   Glucose, Bld 151 (*)    Total Protein 5.9 (*)    Albumin 3.3 (*)    Total Bilirubin 1.3 (*)    All other components within normal limits  BRAIN NATRIURETIC PEPTIDE - Abnormal; Notable for the following components:   B Natriuretic Peptide 788.5 (*)    All other components within normal limits  GLUCOSE, CAPILLARY - Abnormal; Notable for the following components:   Glucose-Capillary 158 (*)    All other components within normal limits  TROPONIN I (HIGH SENSITIVITY) - Abnormal; Notable for the following components:   Troponin I (High Sensitivity) 62 (*)    All other components within normal limits  TROPONIN I (HIGH SENSITIVITY) - Abnormal; Notable for the following components:   Troponin I (High Sensitivity) 174 (*)    All other components within normal limits  SARS CORONAVIRUS 2 BY RT PCR  LACTIC ACID, PLASMA  LEGIONELLA PNEUMOPHILA SEROGP 1 UR AG  STREP PNEUMONIAE URINARY ANTIGEN  BASIC METABOLIC PANEL  CBC  PROCALCITONIN  TROPONIN I (HIGH SENSITIVITY)    EKG EKG Interpretation  Date/Time:  Sunday August 19 2022 17:07:22 EDT Ventricular Rate:  133 PR Interval:  105 QRS Duration: 181 QT Interval:  420 QTC Calculation: 625 R Axis:   130 Text Interpretation: Sinus tachycardia RBBB and LPFB Since last  tracing rate faster Confirmed by Noemi Chapel [phone]) on 09/05/2022 7:33:18 PM  Radiology CT Angio Chest PE W and/or Wo Contrast  Result Date: 09/06/2022 CLINICAL DATA:  Positive D-dimer, chest pain, shortness of breath EXAM: CT ANGIOGRAPHY CHEST WITH CONTRAST TECHNIQUE: Multidetector CT imaging of the chest was performed using the standard protocol during bolus administration of intravenous contrast. Multiplanar CT image reconstructions and MIPs were obtained to evaluate the vascular anatomy. RADIATION DOSE REDUCTION: This exam was performed according to the departmental dose-optimization program which includes automated exposure control, adjustment of the mA and/or kV according to patient size and/or use of iterative reconstruction technique. CONTRAST:  48mL OMNIPAQUE IOHEXOL 350 MG/ML SOLN COMPARISON:  06/21/2022 FINDINGS: Cardiovascular: There are no intraluminal filling defects in pulmonary artery branches. Contrast density in the thoracic aorta is less than adequate to evaluate the lumen. Extensive coronary artery calcifications are seen. There is prosthetic aortic valve. Mediastinum/Nodes: No significant lymphadenopathy is seen. Lungs/Pleura: Centrilobular emphysema is seen. In image 30 of series 7, there is 5 mm nodule in right upper lobe with no significant interval change. New patchy alveolar infiltrate in left lower lobe suggesting pneumonia. There is no pleural effusion or pneumothorax. Upper Abdomen: There is reflux of contrast into hepatic veins suggesting tricuspid incompetence. There is 5.8 cm cyst in the upper pole of right kidney. There are a few low-density lesions in the upper pole of left kidney measuring up to 1.4 cm suggesting renal cysts. In image 109 of series 5, there is 11 mm lesion with density measurements higher than usual for simple cyst, possibly hyperdense cyst. Similar finding was seen on the previous study. Musculoskeletal: No acute findings are seen. Metallic sutures are seen  in the sternum. Review of the MIP images confirms the above findings. IMPRESSION: There is new patchy alveolar infiltrate in the left lower lobe suggesting pneumonia. There is no pleural effusion. There is no evidence of pulmonary artery embolism. Extensive coronary artery calcifications are seen. There is prosthetic aortic valve. There is possible 5 mm granuloma  in right upper lobe which appears stable. Bilateral simple renal cysts requiring no follow-up imaging. There is a 1.1 cm high density structure in the anterior midportion of left kidney, possibly hyperdense cyst. Follow-up multiphasic CT should be considered to rule out any underlying solid lesion. Electronically Signed   By: Elmer Picker M.D.   On: 09/08/2022 19:41   DG Chest Portable 1 View  Result Date: 08/18/2022 CLINICAL DATA:  Chest pain shortness of breath EXAM: PORTABLE CHEST 1 VIEW COMPARISON:  06/21/2022 FINDINGS: Cardiomegaly status post median sternotomy with CABG and aortic valve prosthesis. Both lungs are clear. The visualized skeletal structures are unremarkable. IMPRESSION: Cardiomegaly without acute abnormality of the lungs in AP portable projection. Electronically Signed   By: Delanna Ahmadi M.D.   On: 09/06/2022 19:13    Procedures Procedures    Medications Ordered in ED Medications  ALPRAZolam Duanne Moron) tablet 0.5 mg (0.5 mg Oral Given 09/04/2022 2211)  enoxaparin (LOVENOX) injection 40 mg (40 mg Subcutaneous Given 09/14/2022 2211)  cefTRIAXone (ROCEPHIN) 2 g in sodium chloride 0.9 % 100 mL IVPB (has no administration in time range)  azithromycin (ZITHROMAX) 500 mg in sodium chloride 0.9 % 250 mL IVPB (has no administration in time range)  sodium chloride flush (NS) 0.9 % injection 3 mL (3 mLs Intravenous Given 09/08/2022 2212)  acetaminophen (TYLENOL) tablet 650 mg (has no administration in time range)    Or  acetaminophen (TYLENOL) suppository 650 mg (has no administration in time range)  ondansetron (ZOFRAN) tablet 4 mg  (has no administration in time range)    Or  ondansetron (ZOFRAN) injection 4 mg (has no administration in time range)  budesonide (PULMICORT) nebulizer solution 0.5 mg (0.5 mg Nebulization Given 08/18/2022 2251)  albuterol (PROVENTIL) (2.5 MG/3ML) 0.083% nebulizer solution 2.5 mg (has no administration in time range)  aspirin EC tablet 81 mg (has no administration in time range)  busPIRone (BUSPAR) tablet 10 mg (has no administration in time range)  ezetimibe (ZETIA) tablet 10 mg (has no administration in time range)  arformoterol (BROVANA) nebulizer solution 15 mcg (15 mcg Nebulization Given 08/23/2022 2251)  furosemide (LASIX) tablet 20 mg (has no administration in time range)  guaiFENesin (MUCINEX) 12 hr tablet 1,200 mg (has no administration in time range)  metoprolol succinate (TOPROL-XL) 24 hr tablet 50 mg (has no administration in time range)  revefenacin (YUPELRI) nebulizer solution 175 mcg (has no administration in time range)  rosuvastatin (CRESTOR) tablet 10 mg (has no administration in time range)  sacubitril-valsartan (ENTRESTO) 24-26 mg per tablet (has no administration in time range)  dapagliflozin propanediol (FARXIGA) tablet 10 mg (has no administration in time range)  insulin aspart (novoLOG) injection 0-9 Units (has no administration in time range)  insulin aspart (novoLOG) injection 0-5 Units ( Subcutaneous Not Given 08/23/2022 2315)  iohexol (OMNIPAQUE) 350 MG/ML injection 93 mL (93 mLs Intravenous Contrast Given 08/18/2022 1914)  azithromycin (ZITHROMAX) 500 mg in sodium chloride 0.9 % 250 mL IVPB (0 mg Intravenous Stopping previously hung infusion 08/25/2022 2234)    ED Course/ Medical Decision Making/ A&P                           Medical Decision Making Amount and/or Complexity of Data Reviewed Labs: ordered. Radiology: ordered.  Risk Prescription drug management. Decision regarding hospitalization.   76 year old male with past medical history of chronic respiratory failure  on 4 L nasal cannula at baseline, CAD s/p CABG x3, vertigo, ischemic cardiomyopathy, type  2 diabetes, hypertension, HLD, s/p aortic valve replacement, tobacco abuse, CHF with EF 45%, COPD, and severe pulmonary hypertension who presented with acute onset shortness of breath and chest pain that worsened throughout the day today. With EMS patient was tachycardic and hypoxic to 83% on his home 4 L nasal cannula.  He was placed on 15 L nonrebreather with improvement of his oxygen saturation.  His initial EKG was reading STEMI, and code STEMI was activated prehospital.  After EKG was reviewed by ED attending and discussed with cardiology attending this was downgraded as it did not appear to be a STEMI. Patient was given 2 times nitroglycerin and 324 mg aspirin and 6 mg of morphine prior to arrival.  On arrival patient oxygen was decreased to 6 L on the nonrebreather.   Differential diagnosis includes PE, DVT, COPD exacerbation, pulmonary edema, ACS, pneumonia.   Labs notable for leukocytosis of 13.  No anemia.  No AKI or significant electrolyte abnormalities.  Initial troponin 62.  BNP similar to baseline at 788.  Repeat troponin is pending.  Low suspicion for ACS as primary cause of patient's symptoms, as he did have a recent right/left heart cath approximately 1 month ago that showed severe underlying disease, but patent grafts.  Suspect patient likely has demand ischemia causing his troponin elevation.  Imaging reviewed and did not show PE, however did show a new infiltrate concerning for pneumonia.  Patient was treated with Rocephin and azithromycin.  Due to increasing oxygen requirement from baseline, now on 6 L, and tachycardia.  Patient admitted to the hospital for acute hypoxic respiratory failure likely due to pneumonia.         Final Clinical Impression(s) / ED Diagnoses Final diagnoses:  None    Rx / DC Orders ED Discharge Orders     None         Kela Millin, MD Aug 31, 2022  [id number]    Eber Hong, MD 08/22/2022 2033

## 2022-08-19 NOTE — Hospital Course (Signed)
Harold Parker is a 76 y.o. 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

## 2022-08-19 NOTE — ED Triage Notes (Signed)
Pt BIB [facility] for eval of SOB/CP. Pt reports onset this AM but has gradually worsened throughout the day. Called EMS, EMS reports on their arrival pt was acutely anxious, wears 4LPM at baseline but was satting 78% on his home O2 on EMS arrival. Pt rec'd 324 ASA, 2 SL NTG, 6mg  morphine IV. Pt arrives on NRB @ 95% O2, tachypneic and anxious.

## 2022-08-19 NOTE — Assessment & Plan Note (Signed)
Continue Zetia and rosuvastatin.

## 2022-08-19 NOTE — Assessment & Plan Note (Signed)
CT consistent with left lower lobe pneumonia.  WBC 13.3. -Continue IV ceftriaxone and azithromycin -Follow strep pneumonia urinary antigen

## 2022-08-19 NOTE — Assessment & Plan Note (Signed)
Continue Farxiga, hold glipizide and metformin.  Placed on SSI.

## 2022-08-19 NOTE — Assessment & Plan Note (Signed)
Continue Entresto, Toprol-XL, Lasix.

## 2022-08-19 NOTE — Assessment & Plan Note (Signed)
1.1 cm high density structure in the anterior midportion of left kidney seen on CT.  Possibly hyperdense cyst.  Follow-up multiphasic CT recommended to rule out any underlying solid lesion.

## 2022-08-19 NOTE — H&P (Signed)
Initial vitals History and Physical    Harold Parker [id number] DOB: October 05, 1946 DOA: 09/15/2022  PCP: Lajean Manes, MD  Patient coming from: Home via EMS  I have personally briefly reviewed patient's old medical records in [place]  Chief Complaint: Shortness of breath  HPI: Harold Parker is a 76 y.o. male with medical history significant for CAD s/p CABG, chronic combined systolic and diastolic CHF (EF [id number]), AS s/p bioprosthetic AVR, COPD, chronic respiratory failure with hypoxia on 4 L O2 via [place], history of GI bleed due to duodenal ulcers, T2DM, HTN, HLD, anxiety who presented to the ED for evaluation of shortness of breath.  Patient states he has been having progressive shortness of breath over the last 2 days.  He has had nonspecific intermittent chest discomfort as well.  He says he does have chronic issues with occasional anginal type symptoms related to his known severe CAD.  He has not had any cough.  He reports chills and diaphoresis but no subjective fevers.  He denies any nausea, vomiting, diarrhea, peripheral edema.  He reports good urine output without dysuria.  He is feeling anxious.  ED Course  Labs/Imaging on admission: I have personally reviewed following labs and imaging studies.  Initial vitals showed BP 129/62, pulse 127, RR 38, temp 99.7 F, SPO2 92% on 10 L NRB.  Labs show WBC 13.3, hemoglobin 15.4, platelets 180,000, sodium 141, potassium 3.7, bicarb 23, BUN 19, creatinine 1.16, serum glucose 151, troponin 62 > 174, BNP 788.5, lactic acid 1.6.  Portable chest x-ray negative for focal consolidation, edema, effusion.  Post CABG and aortic valve prosthesis changes noted.  CTA chest PE shows new patchy alveolar infiltrate in the left lower lobe without pleural effusion.  No evidence of pulmonary artery embolism.  Extensive coronary artery calcifications noted.  Possible 5 mm granuloma in right upper lobe appears stable.  1.1 cm high density structure  in the anterior midportion of the left kidney seen, possibly hyperdense cyst.  , Patient was given IV ceftriaxone and azithromycin.  The hospitalist service was consulted to admit for further evaluation and management.  Review of Systems: All systems reviewed and are negative except as documented in history of present illness above.   Past Medical History:  Diagnosis Date   Bilateral carotid bruits    a. 01/2018 U/S: < 50% bilat ICA stenosis.   CAD (coronary artery disease)    a. 1998 s/p MI and BMS [place], [place]); b. 1999 redo PCI/rotablator in setting of what sounds like ISR;  c. Multiple stress tests over the years - last ~ 2017, reportedly nl; d. 01/2018 NSTEMI/Cath: LM 51m/d, LAD 50p, 40p/m, D1 60ost, OM1 95, RCA 100ost/p w/ L->R collats, EF 45%; e. s/p 3V CABG 02/19/18 (LIMA-LAD, VG-D1, VG-OM)   CHF (congestive heart failure) (HCC)    Chronic lower back pain    COPD (chronic obstructive pulmonary disease) ([place])    GIB (gastrointestinal bleeding)    a. 02/2018 GIB and anemia w/ Hgb of 4.7 on presentation; b. 03/2018 EGD: 2 nonbleeding duodenal ulcers.   HTN (hypertension)    Hypercholesteremia    Ischemic cardiomyopathy    a. 01/2018 Echo: EF 40-45%, mid-apicalanteroseptal, ant, apical sev HK, mod apicalinferior HK. Gr2 DD. Mod AS, mild MR, mod dil LA, PASP 61mmHg.   Moderate aortic stenosis    a. 01/2018 Echo: Mod AS, mean grad (S) 57mmHg, Valve area (VTI) 1.06 cm^2, (Vmax) 1.27 cm^2; b. 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   Myocardial infarction ([place]) ~  1998/1999   S/P aortic valve replacement with bioprosthetic valve 02/19/2018   a. 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   LIMA to LAD, SVG to D1, SVG to OM, EVH via right thigh and leg   Tobacco abuse    Type II diabetes mellitus (HCC)     Past Surgical History:  Procedure Laterality Date   ABDOMINAL AORTOGRAM N/A 03/04/2019   Procedure: ABDOMINAL AORTOGRAM;   Surgeon: Iran Ouch, MD;  Location: [facility] INVASIVE CV LAB;  Service: Cardiovascular;  Laterality: N/A;   AORTIC VALVE REPLACEMENT N/A 02/19/2018   Procedure: AORTIC VALVE REPLACEMENT (AVR);  Surgeon: Purcell Nails, MD;  Location: [facility] OR;  Service: Open Heart Surgery;  Laterality: N/A;   COLONOSCOPY     CORONARY ANGIOPLASTY WITH STENT PLACEMENT  ~ 1998/1999   CORONARY ARTERY BYPASS GRAFT N/A 02/19/2018   Procedure: CORONARY ARTERY BYPASS GRAFTING (CABG) x three , using left internal mammary artery and right leg greater saphenous vein harvested endoscopically;  Surgeon: Purcell Nails, MD;  Location: [facility] OR;  Service: Open Heart Surgery;  Laterality: N/A;   ESOPHAGOGASTRODUODENOSCOPY (EGD) WITH PROPOFOL N/A 03/18/2018   Procedure: ESOPHAGOGASTRODUODENOSCOPY (EGD) WITH PROPOFOL;  Surgeon: Midge Minium, MD;  Location: [facility] ENDOSCOPY;  Service: Endoscopy;  Laterality: N/A;   LEFT HEART CATH AND CORONARY ANGIOGRAPHY N/A 02/06/2018   Procedure: LEFT HEART CATH AND CORONARY ANGIOGRAPHY;  Surgeon: Iran Ouch, MD;  Location: [facility] INVASIVE CV LAB;  Service: Cardiovascular;  Laterality: N/A;   LOWER EXTREMITY ANGIOGRAPHY Bilateral 03/04/2019   Procedure: Lower Extremity Angiography;  Surgeon: Iran Ouch, MD;  Location: [facility] INVASIVE CV LAB;  Service: Cardiovascular;  Laterality: Bilateral;   PERIPHERAL VASCULAR ATHERECTOMY Left 03/04/2019   Procedure: PERIPHERAL VASCULAR ATHERECTOMY;  Surgeon: Iran Ouch, MD;  Location: [facility] INVASIVE CV LAB;  Service: Cardiovascular;  Laterality: Left;  SFA   RIGHT/LEFT HEART CATH AND CORONARY ANGIOGRAPHY Bilateral 07/16/2022   Procedure: RIGHT/LEFT HEART CATH AND CORONARY ANGIOGRAPHY;  Surgeon: Iran Ouch, MD;  Location: [facility] INVASIVE CV LAB;  Service: Cardiovascular;  Laterality: Bilateral;   TEE WITHOUT CARDIOVERSION N/A 02/19/2018   Procedure: TRANSESOPHAGEAL ECHOCARDIOGRAM (TEE);  Surgeon: Purcell Nails, MD;  Location: [facility] OR;  Service: Open Heart  Surgery;  Laterality: N/A;   TONSILLECTOMY      Social History:  reports that he quit smoking about 4 years ago. His smoking use included cigarettes. He has a 40.00 pack-year smoking history. He has never used smokeless tobacco. He reports that he does not drink alcohol and does not use drugs.  Allergies  Allergen Reactions   Atorvastatin Other (See Comments)    Leg aches and weakness  Other reaction(s): Other (See Comments) Leg aches and weakness  Other reaction(s): Other (See Comments) Leg aches and weakness   Atorvastatin Calcium Other (See Comments)    Family History  Problem Relation Age of Onset   Lymphoma Mother    Peripheral vascular disease Father      Prior to Admission medications   Medication Sig Start Date End Date Taking? Authorizing Provider  acetaminophen (TYLENOL) 500 MG tablet Take 1,000 mg by mouth daily as needed for moderate pain or headache.    [provider]  albuterol (PROVENTIL) (2.5 MG/3ML) 0.083% nebulizer solution TAKE BY NEBULIZATION EVERY 6 HOURS AS NEEDED FOR WHEEZING OR SHORTNESS OF BREATH 07/19/22   Salena Saner, MD  ALPRAZolam Prudy Feeler) 0.5 MG tablet Take  0.5 mg by mouth 2 (two) times daily.    [provider]  aspirin EC 81 MG tablet Take 81 mg by mouth daily.    [provider]  budesonide (PULMICORT) 0.5 MG/2ML nebulizer solution TAKE 2MLS BY NEBULIZATION TWICE DAILY USE AFTER PERFOROMIST 02/07/22   Tyler Pita, MD  busPIRone (BUSPAR) 10 MG tablet Take 10 mg by mouth 2 (two) times daily. 04/15/21   [provider]  cholecalciferol (VITAMIN D3) 25 MCG (1000 UNIT) tablet Take 4,000 Units by mouth daily.    [provider]  esomeprazole (NEXIUM) 40 MG capsule Take 40 mg by mouth at bedtime.     [provider]  ezetimibe (ZETIA) 10 MG tablet Take 10 mg by mouth at bedtime.     [provider]  FARXIGA 10 MG TABS tablet Take 10 mg by mouth every morning. 07/16/22    [provider]  formoterol (PERFOROMIST) 20 MCG/2ML nebulizer solution TAKE 2 MLS BY MOUTH BY NEBULIZATION TWICE DAILY 02/05/22   Tyler Pita, MD  furosemide (LASIX) 20 MG tablet Take 1 tablet (20 mg total) by mouth daily. 06/18/22 09/16/22  Dunn, Areta Haber, PA-C  glipiZIDE (GLUCOTROL) 5 MG tablet Take 1 tablet (5 mg total) by mouth 2 (two) times daily before a meal. Take 5 mg in the morning and take a second 5 mg dose at night on Mon, Wed, and Fri Patient taking differently: Take 5 mg by mouth 2 (two) times daily before a meal. 12/25/21   Richarda Osmond, MD  guaiFENesin (MUCINEX) 600 MG 12 hr tablet Take 1,200 mg by mouth every 12 (twelve) hours as needed. 01/23/21   [provider]  meclizine (ANTIVERT) 25 MG tablet Take 1 tablet (25 mg total) by mouth 3 (three) times daily as needed for dizziness. 06/24/22   Sharen Hones, MD  metFORMIN (GLUCOPHAGE) 1000 MG tablet TAKE 1 TABLET BY MOUTH TWICE DAILY WITH A MEAL 04/27/18   Dahlia Byes, MD  metoprolol succinate (TOPROL-XL) 50 MG 24 hr tablet TAKE 1 TABLET BY MOUTH EVERY DAY 02/09/22   Wellington Hampshire, MD  Nebulizers (COMPRESSOR/NEBULIZER) MISC Use as directed 10/26/21   Tyler Pita, MD  PROAIR HFA 108 [phone] Base) MCG/ACT inhaler Inhale 1 puff into the lungs every 4 (four) hours as needed for wheezing or shortness of breath. 02/04/19   [provider]  revefenacin (YUPELRI) 175 MCG/3ML nebulizer solution Take 3 mLs (175 mcg total) by nebulization daily. 03/06/22   Tyler Pita, MD  rosuvastatin (CRESTOR) 10 MG tablet TAKE 1 TABLET BY MOUTH EVERY DAY 03/01/22   Wellington Hampshire, MD  sacubitril-valsartan (ENTRESTO) 24-26 MG Take 1 tablet by mouth 2 (two) times daily. Patient not taking: Reported on 07/16/2022 07/06/22   Rise Mu, PA-C  traZODone (DESYREL) 50 MG tablet Take 50 mg by mouth at bedtime as needed. 02/09/21   [provider]    Physical Exam: Vitals:   09/03/2022 1945 09/11/2022 2100 09/05/2022  2205 09/14/2022 2207  BP: 124/73 105/67  134/72  Pulse: (!) 117 (!) 122  (!) 123  Resp: (!) 28 (!) 30  20  Temp:  99 F (37.2 C)  98.3 F (36.8 C)  TempSrc:    Oral  SpO2: 96% 100%  97%  Weight:   75.3 kg   Height:   5\' 5"  (1.651 m)    Constitutional: Resting in bed with head elevated, on nonrebreather Eyes: EOMI, lids and conjunctivae normal ENMT: Mucous membranes  are moist. Posterior pharynx clear of any exudate or lesions.Normal dentition.  Neck: normal, supple, no masses. Respiratory: Faint inspiratory crackles left lower lung field.  Increased respiratory effort while on NRB. No accessory muscle use.  Cardiovascular: Tachycardic, no murmurs / rubs / gallops. No extremity edema. 2+ pedal pulses. Abdomen: no tenderness, no masses palpated. Musculoskeletal: no clubbing / cyanosis. No joint deformity upper and lower extremities. Good ROM, no contractures. Normal muscle tone.  Skin: no rashes, lesions, ulcers. No induration Neurologic: Sensation intact. Strength 5/5 in all 4.  Psychiatric: Normal judgment and insight. Alert and oriented x 3.  EKG: Personally reviewed. Sinus tachycardia, rate 133, RBBB and LPFB.  Rate is faster when compared to prior.  Assessment/Plan Principal Problem:   Community acquired pneumonia of left lower lobe of lung Active Problems:   Acute on chronic respiratory failure with hypoxia (HCC)   CAD S/P CABG x 3   Chronic combined systolic and diastolic CHF (congestive heart failure) (HCC)   Stage 3 severe COPD by GOLD classification (HCC)   Type 2 diabetes mellitus with hyperglycemia, without long-term current use of insulin (HCC)   Hypertension associated with diabetes (HCC)   Hyperlipidemia associated with type 2 diabetes mellitus (HCC)   Anxiety   Lesion of left native kidney   Harold Parker is a 76 y.o. male with medical history significant for CAD s/p CABG, chronic combined systolic and diastolic CHF (EF 32-44% 05/2022), AS s/p bioprosthetic AVR,  COPD, chronic respiratory failure with hypoxia on 4 L O2 via [place], history of GI bleed due to duodenal ulcers, T2DM, HTN, HLD, anxiety who is admitted with acute on chronic hypoxic respiratory failure due to left lower lobe pneumonia.  Assessment and Plan: * Community acquired pneumonia of left lower lobe of lung CT consistent with left lower lobe pneumonia.  WBC 13.3. -Continue IV ceftriaxone and azithromycin -Follow strep pneumonia urinary antigen  Acute on chronic respiratory failure with hypoxia (HCC) Wears 4 L O2 via [place] at all times at baseline.  Requiring NRB on admission. -Wean supplemental O2 as able -Use BiPAP if needed  Chronic combined systolic and diastolic CHF (congestive heart failure) (HCC) BNP chronically elevated however appears euvolemic on admission.  No evidence of pulmonary edema on imaging. -Continue Lasix 20 mg daily -Continue Entresto 24-26 mg twice daily -Continue Toprol-XL 50 mg daily -Continue Farxiga -Strict I/O's and daily weights  CAD S/P CABG x 3 Currently denies any chest pain.  Troponin trending up 62 > 174.  EKG shows sinus tachycardia without acute ischemic changes.  Underwent recent right/left heart cath 07/16/2022 which showed severe underlying three-vessel and left main CAD with patent grafts.  Suspect type II demand ischemia in setting of acute on chronic hypoxic respiratory failure. -Trend troponin -If continuing to rise will consider IV heparin -Continue aspirin, rosuvastatin, Zetia  Stage 3 severe COPD by GOLD classification (HCC) No wheezing on admission. -Wean to home flow O2 via [place] as able -Continue Pulmicort, formoterol, Yupelri, albuterol as needed  Type 2 diabetes mellitus with hyperglycemia, without long-term current use of insulin (HCC) Continue Farxiga, hold glipizide and metformin.  Placed on SSI.  Hypertension associated with diabetes (HCC) Continue Entresto, Toprol-XL, Lasix.  Hyperlipidemia associated with type 2 diabetes  mellitus (HCC) Continue Zetia and rosuvastatin.  Lesion of left native kidney 1.1 cm high density structure in the anterior midportion of left kidney seen on CT.  Possibly hyperdense cyst.  Follow-up multiphasic CT recommended to rule out any underlying solid lesion.  Anxiety  Continue home Xanax 0.5 mg twice daily and BuSpar.  DVT prophylaxis: enoxaparin (LOVENOX) injection 40 mg Start: 08/17/2022 2200 Code Status: Partial code, accepts intubation but otherwise no CPR/ACLS measures Family Communication: Discussed with patient, he has discussed with family Disposition Plan: From home, dispo pending clinical progress Consults called: None Severity of Illness: The appropriate patient status for this patient is INPATIENT. Inpatient status is judged to be reasonable and necessary in order to provide the required intensity of service to ensure the patient's safety. The patient's presenting symptoms, physical exam findings, and initial radiographic and laboratory data in the context of their chronic comorbidities is felt to place them at high risk for further clinical deterioration. Furthermore, it is not anticipated that the patient will be medically stable for discharge from the hospital within 2 midnights of admission.   * I certify that at the point of admission it is my clinical judgment that the patient will require inpatient hospital care spanning beyond 2 midnights from the point of admission due to high intensity of service, high risk for further deterioration and high frequency of surveillance required.Zada Finders MD [facility]  If 7PM-7AM, please contact night-coverage [email or web address]  08/27/2022, 10:43 PM

## 2022-08-19 NOTE — Assessment & Plan Note (Addendum)
BNP chronically elevated however appears euvolemic on admission.  No evidence of pulmonary edema on imaging. -Continue Lasix 20 mg daily -Continue Entresto 24-26 mg twice daily -Continue Toprol-XL 50 mg daily -Continue Farxiga -Strict I/O's and daily weights

## 2022-08-19 NOTE — Assessment & Plan Note (Signed)
No wheezing on admission. -Wean to home flow O2 via [place] as able -Continue Pulmicort, formoterol, Yupelri, albuterol as needed

## 2022-08-19 NOTE — Progress Notes (Signed)
   08/30/2022 2207  Vitals  Temp 98.3 F (36.8 C)  Temp Source Oral  BP 134/72  MAP (mmHg) 89  BP Location Left Arm  BP Method Automatic  Patient Position (if appropriate) Lying  Pulse Rate (!) 123  ECG Heart Rate (!) 123  Resp 20  Level of Consciousness  Level of Consciousness Alert  MEWS COLOR  MEWS Score Color Yellow  Oxygen Therapy  SpO2 97 %  O2 Device Non-rebreather Mask  O2 Flow Rate (L/min) 15 L/min  Pain Assessment  Pain Scale 0-10  Pain Score 0  PCA/Epidural/Spinal Assessment  Respiratory Pattern Labored;Accessory muscle use  MEWS Score  MEWS Temp 0  MEWS Systolic 0  MEWS Pulse 2  MEWS RR 0  MEWS LOC 0  MEWS Score 2

## 2022-08-20 ENCOUNTER — Inpatient Hospital Stay (HOSPITAL_COMMUNITY): Payer: Medicare Other | Admitting: General Practice

## 2022-08-20 DIAGNOSIS — L8990 Pressure ulcer of unspecified site, unspecified stage: Secondary | ICD-10-CM | POA: Insufficient documentation

## 2022-08-20 DIAGNOSIS — Z951 Presence of aortocoronary bypass graft: Secondary | ICD-10-CM

## 2022-08-20 DIAGNOSIS — I110 Hypertensive heart disease with heart failure: Secondary | ICD-10-CM

## 2022-08-20 DIAGNOSIS — J9621 Acute and chronic respiratory failure with hypoxia: Secondary | ICD-10-CM

## 2022-08-20 DIAGNOSIS — I2510 Atherosclerotic heart disease of native coronary artery without angina pectoris: Secondary | ICD-10-CM

## 2022-08-20 DIAGNOSIS — N289 Disorder of kidney and ureter, unspecified: Secondary | ICD-10-CM

## 2022-08-20 DIAGNOSIS — J189 Pneumonia, unspecified organism: Secondary | ICD-10-CM | POA: Diagnosis not present

## 2022-08-20 DIAGNOSIS — I5042 Chronic combined systolic (congestive) and diastolic (congestive) heart failure: Secondary | ICD-10-CM

## 2022-08-20 DIAGNOSIS — R092 Respiratory arrest: Secondary | ICD-10-CM

## 2022-08-20 DIAGNOSIS — I5022 Chronic systolic (congestive) heart failure: Secondary | ICD-10-CM

## 2022-08-20 DIAGNOSIS — I468 Cardiac arrest due to other underlying condition: Secondary | ICD-10-CM

## 2022-08-20 DIAGNOSIS — J449 Chronic obstructive pulmonary disease, unspecified: Secondary | ICD-10-CM

## 2022-08-20 DIAGNOSIS — Z87891 Personal history of nicotine dependence: Secondary | ICD-10-CM

## 2022-08-20 DIAGNOSIS — I5043 Acute on chronic combined systolic (congestive) and diastolic (congestive) heart failure: Secondary | ICD-10-CM

## 2022-08-20 DIAGNOSIS — I214 Non-ST elevation (NSTEMI) myocardial infarction: Secondary | ICD-10-CM

## 2022-08-20 DIAGNOSIS — E1165 Type 2 diabetes mellitus with hyperglycemia: Secondary | ICD-10-CM

## 2022-08-20 DIAGNOSIS — J989 Respiratory disorder, unspecified: Secondary | ICD-10-CM

## 2022-08-20 LAB — CBC
HCT: 42.9 % (ref 39.0–52.0)
Hemoglobin: 15 g/dL (ref 13.0–17.0)
MCH: 30.4 pg (ref 26.0–34.0)
MCHC: 35 g/dL (ref 30.0–36.0)
MCV: 87 fL (ref 80.0–100.0)
Platelets: 204 10*3/uL (ref 150–400)
RBC: 4.93 MIL/uL (ref 4.22–5.81)
RDW: 14.6 % (ref 11.5–15.5)
WBC: 19.5 10*3/uL — ABNORMAL HIGH (ref 4.0–10.5)
nRBC: 0 % (ref 0.0–0.2)

## 2022-08-20 LAB — TROPONIN I (HIGH SENSITIVITY)
Troponin I (High Sensitivity): 1490 ng/L (ref <18)
Troponin I (High Sensitivity): 1937 ng/L (ref <18)

## 2022-08-20 LAB — BASIC METABOLIC PANEL
Anion gap: 16 — ABNORMAL HIGH (ref 5–15)
BUN: 22 mg/dL (ref 8–23)
CO2: 21 mmol/L — ABNORMAL LOW (ref 22–32)
Calcium: 9.2 mg/dL (ref 8.9–10.3)
Chloride: 101 mmol/L (ref 98–111)
Creatinine, Ser: 1.4 mg/dL — ABNORMAL HIGH (ref 0.61–1.24)
GFR, Estimated: 51 mL/min — ABNORMAL LOW (ref >60)
Glucose, Bld: 160 mg/dL — ABNORMAL HIGH (ref 70–99)
Potassium: 4.3 mmol/L (ref 3.5–5.1)
Sodium: 138 mmol/L (ref 135–145)

## 2022-08-20 LAB — HEPATIC FUNCTION PANEL
ALT: 17 U/L (ref 0–44)
AST: 29 U/L (ref 15–41)
Albumin: 3.1 g/dL — ABNORMAL LOW (ref 3.5–5.0)
Alkaline Phosphatase: 59 U/L (ref 38–126)
Bilirubin, Direct: 0.3 mg/dL — ABNORMAL HIGH (ref 0.0–0.2)
Indirect Bilirubin: 0.6 mg/dL (ref 0.3–0.9)
Total Bilirubin: 0.9 mg/dL (ref 0.3–1.2)
Total Protein: 6.1 g/dL — ABNORMAL LOW (ref 6.5–8.1)

## 2022-08-20 LAB — STREP PNEUMONIAE URINARY ANTIGEN: Strep Pneumo Urinary Antigen: NEGATIVE

## 2022-08-20 LAB — GLUCOSE, CAPILLARY
Glucose-Capillary: 140 mg/dL — ABNORMAL HIGH (ref 70–99)
Glucose-Capillary: 198 mg/dL — ABNORMAL HIGH (ref 70–99)

## 2022-08-20 LAB — PROCALCITONIN: Procalcitonin: 3.77 ng/mL

## 2022-08-20 MED ORDER — HEPARIN BOLUS VIA INFUSION
3000.0000 [IU] | Freq: Once | INTRAVENOUS | Status: AC
  Administered 2022-08-20: 3000 [IU] via INTRAVENOUS
  Filled 2022-08-20: qty 3000

## 2022-08-20 MED ORDER — FLUMAZENIL 0.5 MG/5ML IV SOLN
0.5000 mg | Freq: Once | INTRAVENOUS | Status: DC
Start: 2022-08-20 — End: 2022-08-20

## 2022-08-20 MED ORDER — TENECTEPLASE 50 MG IV KIT
40.0000 mg | PACK | Freq: Once | INTRAVENOUS | Status: DC
  Filled 2022-08-20: qty 10

## 2022-08-20 MED ORDER — LORAZEPAM 2 MG/ML IJ SOLN
0.5000 mg | Freq: Once | INTRAMUSCULAR | Status: AC
  Administered 2022-08-20: 0.5 mg via INTRAVENOUS
  Filled 2022-08-20: qty 1

## 2022-08-20 MED ORDER — FLUMAZENIL 0.5 MG/5ML IV SOLN
INTRAVENOUS | Status: AC
  Filled 2022-08-20: qty 5

## 2022-08-20 MED ORDER — HEPARIN (PORCINE) 25000 UT/250ML-% IV SOLN
900.0000 [IU]/h | INTRAVENOUS | Status: DC
  Administered 2022-08-20: 900 [IU]/h via INTRAVENOUS
  Filled 2022-08-20: qty 250

## 2022-08-20 MED ORDER — NALOXONE HCL 0.4 MG/ML IJ SOLN
INTRAMUSCULAR | Status: AC
  Filled 2022-08-20: qty 1

## 2022-08-22 LAB — LEGIONELLA PNEUMOPHILA SEROGP 1 UR AG: L. pneumophila Serogp 1 Ur Ag: NEGATIVE

## 2022-09-04 ENCOUNTER — Encounter (HOSPITAL_COMMUNITY): Payer: Medicare Other | Admitting: Cardiology

## 2022-09-16 NOTE — Progress Notes (Signed)
ANTICOAGULATION CONSULT NOTE - Initial Consult  Pharmacy Consult for heparin Indication: chest pain/ACS  Allergies  Allergen Reactions   Atorvastatin Other (See Comments)    Leg aches and weakness  Other reaction(s): Other (See Comments) Leg aches and weakness  Other reaction(s): Other (See Comments) Leg aches and weakness   Atorvastatin Calcium Other (See Comments)    Patient Measurements: Height: 5\' 5"  (165.1 cm) Weight: 75.1 kg (165 lb 9.1 oz) IBW/kg (Calculated) : 61.5  Vital Signs: Temp: 97.9 F (36.6 C) (09/04 0002) Temp Source: Axillary (09/04 0002) BP: 112/70 (09/04 0014) Pulse Rate: 111 (09/04 0002)  Labs: Recent Labs    08/30/2022 1705 08/18/2022 1920 13-Sep-2022 0148  HGB 15.4  --  15.0  HCT 44.3  --  42.9  PLT 180  --  204  CREATININE 1.16  --  1.40*  TROPONINIHS 62* 174* 1,490*    Estimated Creatinine Clearance: 41.1 mL/min (A) (by C-G formula based on SCr of 1.4 mg/dL (H)).   Medical History: Past Medical History:  Diagnosis Date   Bilateral carotid bruits    a. 01/2018 U/S: < 50% bilat ICA stenosis.   CAD (coronary artery disease)    a. 1998 s/p MI and BMS [place], [place]); b. 1999 redo PCI/rotablator in setting of what sounds like ISR;  c. Multiple stress tests over the years - last ~ 2017, reportedly nl; d. 01/2018 NSTEMI/Cath: LM 79m/d, LAD 50p, 40p/m, D1 60ost, OM1 95, RCA 100ost/p w/ L->R collats, EF 45%; e. s/p 3V CABG 02/19/18 (LIMA-LAD, VG-D1, VG-OM)   CHF (congestive heart failure) (HCC)    Chronic lower back pain    COPD (chronic obstructive pulmonary disease) (HCC)    GIB (gastrointestinal bleeding)    a. 02/2018 GIB and anemia w/ Hgb of 4.7 on presentation; b. 03/2018 EGD: 2 nonbleeding duodenal ulcers.   HTN (hypertension)    Hypercholesteremia    Ischemic cardiomyopathy    a. 01/2018 Echo: EF 40-45%, mid-apicalanteroseptal, ant, apical sev HK, mod apicalinferior HK. Gr2 DD. Mod AS, mild MR, mod dil LA, PASP 02/2018.   Moderate aortic  stenosis    a. 01/2018 Echo: Mod AS, mean grad (S) 02/2018, Valve area (VTI) 1.06 cm^2, (Vmax) 1.27 cm^2; b. 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   Myocardial infarction [facility]) ~ 1998/1999   S/P aortic valve replacement with bioprosthetic valve 02/19/2018   a. 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   LIMA to LAD, SVG to D1, SVG to OM, EVH via right thigh and leg   Tobacco abuse    Type II diabetes mellitus (HCC)     Medications:  Medications Prior to Admission  Medication Sig Dispense Refill Last Dose   acetaminophen (TYLENOL) 500 MG tablet Take 1,000 mg by mouth daily as needed for moderate pain or headache.      albuterol (PROVENTIL) (2.5 MG/3ML) 0.083% nebulizer solution TAKE 04/21/2018 BY NEBULIZATION EVERY 6 HOURS AS NEEDED FOR WHEEZING OR SHORTNESS OF BREATH 75 mL 12    ALPRAZolam (XANAX) 0.5 MG tablet Take 0.5 mg by mouth 2 (two) times daily.      aspirin EC 81 MG tablet Take 81 mg by mouth daily.      budesonide (PULMICORT) 0.5 MG/2ML nebulizer solution TAKE BY NEBULIZATION TWICE DAILY USE AFTER PERFOROMIST 120 mL 11    busPIRone (BUSPAR) 10 MG tablet Take 10 mg by mouth 2 (two) times daily.      cholecalciferol (VITAMIN  D3) 25 MCG (1000 UNIT) tablet Take 4,000 Units by mouth daily.      esomeprazole (NEXIUM) 40 MG capsule Take 40 mg by mouth at bedtime.       ezetimibe (ZETIA) 10 MG tablet Take 10 mg by mouth at bedtime.       FARXIGA 10 MG TABS tablet Take 10 mg by mouth every morning.      formoterol (PERFOROMIST) 20 MCG/2ML nebulizer solution TAKE 2 MLS BY MOUTH BY NEBULIZATION TWICE DAILY 120 mL 11    furosemide (LASIX) 20 MG tablet Take 1 tablet (20 mg total) by mouth daily. 90 tablet 3    glipiZIDE (GLUCOTROL) 5 MG tablet Take 1 tablet (5 mg total) by mouth 2 (two) times daily before a meal. Take 5 mg in the morning and take a second 5 mg dose at night on Mon, Wed, and Fri (Patient taking differently: Take 5 mg by  mouth 2 (two) times daily before a meal.)      guaiFENesin (MUCINEX) 600 MG 12 hr tablet Take 1,200 mg by mouth every 12 (twelve) hours as needed.      meclizine (ANTIVERT) 25 MG tablet Take 1 tablet (25 mg total) by mouth 3 (three) times daily as needed for dizziness. 30 tablet 0    metFORMIN (GLUCOPHAGE) 1000 MG tablet TAKE 1 TABLET BY MOUTH TWICE DAILY WITH A MEAL 60 tablet 0    metoprolol succinate (TOPROL-XL) 50 MG 24 hr tablet TAKE 1 TABLET BY MOUTH EVERY DAY 90 tablet 0    Nebulizers (COMPRESSOR/NEBULIZER) MISC Use as directed 1 each 0    PROAIR HFA 108 (90 Base) MCG/ACT inhaler Inhale 1 puff into the lungs every 4 (four) hours as needed for wheezing or shortness of breath.      revefenacin (YUPELRI) 175 MCG/3ML nebulizer solution Take 3 mLs (175 mcg total) by nebulization daily. 90 mL 11    rosuvastatin (CRESTOR) 10 MG tablet TAKE 1 TABLET BY MOUTH EVERY DAY 90 tablet 0    sacubitril-valsartan (ENTRESTO) 24-26 MG Take 1 tablet by mouth 2 (two) times daily. (Patient not taking: Reported on 07/16/2022) 60 tablet 11    traZODone (DESYREL) 50 MG tablet Take 50 mg by mouth at bedtime as needed.      Scheduled:   ALPRAZolam  0.5 mg Oral BID   arformoterol  15 mcg Nebulization Q12H   aspirin EC  81 mg Oral Daily   budesonide  0.5 mg Nebulization BID   busPIRone  10 mg Oral BID   dapagliflozin propanediol  10 mg Oral q morning   ezetimibe  10 mg Oral QHS   furosemide  20 mg Oral Daily   insulin aspart  0-5 Units Subcutaneous QHS   insulin aspart  0-9 Units Subcutaneous TID WC   metoprolol succinate  50 mg Oral Daily   revefenacin  175 mcg Nebulization Daily   rosuvastatin  10 mg Oral Daily   sacubitril-valsartan  1 tablet Oral BID   sodium chloride flush  3 mL Intravenous Q12H   Infusions:   azithromycin     cefTRIAXone (ROCEPHIN)  IV      Assessment: 76yo male c/o CP associated with SOB, initial troponins mildly elevated and now rising (38>101>7510) >> to begin heparin.   Goal  of Therapy:  Heparin level 0.3-0.7 units/ml Monitor platelets by anticoagulation protocol: Yes   Plan:  Rec'd LMWH DVT Px at 2200. Heparin 3000 units IV bolus x1 followed by infusion at 900 units/hr. Monitor heparin levels  and CBC.  Vernard Gambles, PharmD, BCPS  2022/09/01,6:47 AM

## 2022-09-16 NOTE — Consult Note (Addendum)
Cardiology Consultation   Patient ID: Harold Parker MRN: [id number]; DOB: [birth year]  Admit date: 09/01/2022 Date of Consult: 09/11/2022  PCP:  Lajean Manes, [place] [facility] Cardiologist:  Kathlyn Sacramento, MD   {  Patient Profile:   Harold Parker is a 76 y.o. male with a hx of CAD status post CABG and bioprosthetic aortic valve replacement in 02/2018, HFrEF, pulmonary hypertension, PAD, DM2, HTN, HLD, GI bleed, and chronic hypoxic respiratory failure on supplemental oxygen, COVID, COPD with prior tobacco  who is being seen September 11, 2022 for the evaluation of NSTEMI at the request of Dr. Alfredia Ferguson.  He was admitted in 01/2018 with an NSTEMI.  LHC revealed severe left main stenosis in addition to significant disease involving the RCA and OM1.  EF was 40 to 45% with moderate aortic stenosis.  He subsequently underwent CABG and aortic valve replacement.  Postoperative course was notable for A-fib but that was controlled with amiodarone.  He was readmitted with an upper GI bleed with a hemoglobin of 4.7 with EGD demonstrating duodenal ulcers.    He was seen in the office on 05/17/2022 and was doing reasonably well from a cardiac perspective.  He did note some chronic mild musculoskeletal anterior chest wall pain that was worse over the preceding weeks and reproducible to palpation.  He noted a prior prescription of prednisone as well as prior NSAID use helped with this discomfort.  He was started on Farxiga with continuation of Toprol-XL and Entresto.  Subsequent Echo on 06/08/2022, to evaluate his cardiomyopathy on maximally tolerated GDMT, demonstrated an EF of 35 to 40%, global hypokinesis with severe apical hypokinesis with spontaneous contrast in the apical region, findings consistent with volume overload, mild LVH, grade 2 diastolic dysfunction, severely reduced RV systolic function with severely enlarged RV cavity size, PASP 74.9 mmHg, mildly dilated left atrium, severely dilated right  atrium, mild to moderate mitral regurgitation, mild to moderate tricuspid regurgitation, and an estimated right atrial pressure of 8 mmHg.  With these findings, after discussion with his primary cardiologist, furosemide was titrated, however with this he developed some AKI needing furosemide to be de-escalated to 20 mg twice daily.   He was admitted to the hospital in 06/2022 with acute on chronic hypoxic respiratory failure felt to be more related to COPD exacerbation over CHF.  During the admission, initial and peak high-sensitivity troponin of 19 with a delta troponin of 18.  BNP 829.  Chest x-ray showed no acute intrathoracic process.  CTA of the chest was negative for PE with moderate central lobar emphysema, aortic atherosclerosis, and dilated main pulmonary trunk consistent with pulmonary hypertension.  [name] was held at time of discharge.  R/LHC on 07/16/2022 demonstrated severe underlying three-vessel and left main CAD with patent grafts including LIMA to LAD, SVG to diagonal, and SVG to OM.  The RCA was small and known to be chronically occluded with left-to-right collaterals.  The bioprosthetic aortic valve was not crossed.  RHC showed normal right and left-sided filling pressures, severe pulmonary hypertension, and a mildly reduced cardiac output.  His coronary artery disease was stable and did not require revascularization.  His severe pulmonary hypertension did not seem to be related to left-sided heart failure, possibly related to intrinsic lung disease.  It was recommended he follow-up with pulmonology and was referred to the advanced heart failure clinic.  He was doing relatively well when seen in clinic 08/17/22.  History of Present Illness:   [name] had worsening shortness  of breath and intermittent chest discomfort for past 2 days. Noted hypoxic on home oxygen by EMS and brought to ER. Required BiPAP.  Admitted for acute on chronic hypoxic respiratory failure 2nd to community  acquired LLL pneumonia.  No PE on CTA of chest. Troponin trending up and cardiology consulted. Currently on 10L [place] and chest pain free.    Latest Reference Range & Units 08/26/22 17:05 08-26-2022 19:20 08/18/2022 01:48 09/13/2022 05:39  Troponin I (High Sensitivity) <18 ng/L 62 (H) 174 (HH) 1,490 (HH) 1,937 (HH)     Past Medical History:  Diagnosis Date   Bilateral carotid bruits    a. 01/2018 U/S: < 50% bilat ICA stenosis.   CAD (coronary artery disease)    a. 1998 s/p MI and BMS [place], [place]); b. 1999 redo PCI/rotablator in setting of what sounds like ISR;  c. Multiple stress tests over the years - last ~ 2017, reportedly nl; d. 01/2018 NSTEMI/Cath: LM 4m/d, LAD 50p, 40p/m, D1 60ost, OM1 95, RCA 100ost/p w/ L->R collats, EF 45%; e. s/p 3V CABG 02/19/18 (LIMA-LAD, VG-D1, VG-OM)   CHF (congestive heart failure) (HCC)    Chronic lower back pain    COPD (chronic obstructive pulmonary disease) (HCC)    GIB (gastrointestinal bleeding)    a. 02/2018 GIB and anemia w/ Hgb of 4.7 on presentation; b. 03/2018 EGD: 2 nonbleeding duodenal ulcers.   HTN (hypertension)    Hypercholesteremia    Ischemic cardiomyopathy    a. 01/2018 Echo: EF 40-45%, mid-apicalanteroseptal, ant, apical sev HK, mod apicalinferior HK. Gr2 DD. Mod AS, mild MR, mod dil LA, PASP .   Moderate aortic stenosis    a. 01/2018 Echo: Mod AS, mean grad (S) , Valve area (VTI) 1.06 cm^2, (Vmax) 1.27 cm^2; b. 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   Myocardial infarction [facility]) ~ 1998/1999   S/P aortic valve replacement with bioprosthetic valve 02/19/2018   a. 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   LIMA to LAD, SVG to D1, SVG to OM, EVH via right thigh and leg   Tobacco abuse    Type II diabetes mellitus (HCC)     Past Surgical History:  Procedure Laterality Date   ABDOMINAL AORTOGRAM N/A 03/04/2019   Procedure: ABDOMINAL AORTOGRAM;  Surgeon: Iran Ouch, MD;   Location: [facility];  Service: Cardiovascular;  Laterality: N/A;   AORTIC VALVE REPLACEMENT N/A 02/19/2018   Procedure: AORTIC VALVE REPLACEMENT (AVR);  Surgeon: Purcell Nails, MD;  Location: [facility] OR;  Service: Open Heart Surgery;  Laterality: N/A;   COLONOSCOPY     CORONARY ANGIOPLASTY WITH STENT PLACEMENT  ~ 1998/1999   CORONARY ARTERY BYPASS GRAFT N/A 02/19/2018   Procedure: CORONARY ARTERY BYPASS GRAFTING (CABG) x three , using left internal mammary artery and right leg greater saphenous vein harvested endoscopically;  Surgeon: Purcell Nails, MD;  Location: [facility] OR;  Service: Open Heart Surgery;  Laterality: N/A;   ESOPHAGOGASTRODUODENOSCOPY (EGD) WITH PROPOFOL N/A 03/18/2018   Procedure: ESOPHAGOGASTRODUODENOSCOPY (EGD) WITH PROPOFOL;  Surgeon: Midge Minium, MD;  Location: [facility] ENDOSCOPY;  Service: Endoscopy;  Laterality: N/A;   LEFT HEART CATH AND CORONARY ANGIOGRAPHY N/A 02/06/2018   Procedure: LEFT HEART CATH AND CORONARY ANGIOGRAPHY;  Surgeon: Iran Ouch, MD;  Location: [facility] INVASIVE CV LAB;  Service: Cardiovascular;  Laterality: N/A;   LOWER EXTREMITY ANGIOGRAPHY Bilateral 03/04/2019   Procedure: Lower Extremity Angiography;  Surgeon: Iran Ouch, MD;  Location:  [facility];  Service: Cardiovascular;  Laterality: Bilateral;   PERIPHERAL VASCULAR ATHERECTOMY Left 03/04/2019   Procedure: PERIPHERAL VASCULAR ATHERECTOMY;  Surgeon: Iran Ouch, MD;  Location: [facility];  Service: Cardiovascular;  Laterality: Left;  SFA   RIGHT/LEFT HEART CATH AND CORONARY ANGIOGRAPHY Bilateral 07/16/2022   Procedure: RIGHT/LEFT HEART CATH AND CORONARY ANGIOGRAPHY;  Surgeon: Iran Ouch, MD;  Location: [facility] INVASIVE CV LAB;  Service: Cardiovascular;  Laterality: Bilateral;   TEE WITHOUT CARDIOVERSION N/A 02/19/2018   Procedure: TRANSESOPHAGEAL ECHOCARDIOGRAM (TEE);  Surgeon: Purcell Nails, MD;  Location: [facility] OR;  Service: Open Heart Surgery;  Laterality: N/A;    TONSILLECTOMY      Inpatient Medications: Scheduled Meds:  ALPRAZolam  0.5 mg Oral BID   arformoterol  15 mcg Nebulization Q12H   aspirin EC  81 mg Oral Daily   budesonide  0.5 mg Nebulization BID   busPIRone  10 mg Oral BID   dapagliflozin propanediol  10 mg Oral q morning   ezetimibe  10 mg Oral QHS   furosemide  20 mg Oral Daily   insulin aspart  0-5 Units Subcutaneous QHS   insulin aspart  0-9 Units Subcutaneous TID WC   metoprolol succinate  50 mg Oral Daily   revefenacin  175 mcg Nebulization Daily   rosuvastatin  10 mg Oral Daily   sacubitril-valsartan  1 tablet Oral BID   sodium chloride flush  3 mL Intravenous Q12H   Continuous Infusions:  azithromycin     cefTRIAXone (ROCEPHIN)  IV 2 g (08/23/2022 0935)   heparin 900 Units/hr (August 23, 2022 0753)   PRN Meds: acetaminophen **OR** acetaminophen, albuterol, guaiFENesin, ondansetron **OR** ondansetron (ZOFRAN) IV  Allergies:    Allergies  Allergen Reactions   Atorvastatin Other (See Comments)    Leg aches and weakness  Other reaction(s): Other (See Comments) Leg aches and weakness  Other reaction(s): Other (See Comments) Leg aches and weakness   Atorvastatin Calcium Other (See Comments)    Social History:   Social History   Socioeconomic History   Marital status: Widowed    Spouse name: Not on file   Number of children: Not on file   Years of education: Not on file   Highest education level: Not on file  Occupational History   Not on file  Tobacco Use   Smoking status: Former    Packs/day: 1.00    Years: 40.00    Total pack years: 40.00    Types: Cigarettes    Quit date: 02/02/2018    Years since quitting: 4.5   Smokeless tobacco: Never   Tobacco comments:    Quit 01/2018 - on 05/01/2018 talked to Asar about Relaspe concerns. He ststaes that he has no taste for tobacco since he quit in Feb.  Vaping Use   Vaping Use: Never used  Substance and Sexual Activity   Alcohol use: Never    Comment: occ   Drug  use: No   Sexual activity: Yes  Other Topics Concern   Not on file  Social History Narrative   Lives in [place] now (from [place]) with [name], life partner. 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     Social Determinants of Health   Financial Resource Strain: Not on file  Food Insecurity: Not on file  Transportation Needs: Not on file  Physical Activity: Not on file  Stress: Not on file  Social Connections: Not on file  Intimate Partner Violence: Not on file  Family History:   Family History  Problem Relation Age of Onset   Lymphoma Mother    Peripheral vascular disease Father      ROS:  Please see the history of present illness.  All other ROS reviewed and negative.     Physical Exam/Data:   Vitals:   September 03, 2022 0710 2022-09-03 0740 03-Sep-2022 0745 09/03/22 0746  BP: 99/65     Pulse: 95     Resp: (!) 21     Temp: 97.8 F (36.6 C)     TempSrc: Oral     SpO2: 98% 98% 99% 95%  Weight:      Height:        Intake/Output Summary (Last 24 hours) at Sep 03, 2022 0951 Last data filed at 09-03-2022 [id number] Gross per 24 hour  Intake 363 ml  Output --  Net 363 ml      2022-09-03    4:39 AM 09/02/2022   10:05 PM 08/18/2022    5:03 PM  Last 3 Weights  Weight (lbs) 165 lb 9.1 oz 166 lb 0.1 oz 169 lb 12.1 oz  Weight (kg) 75.1 kg 75.3 kg 77 kg     Body mass index is 27.55 kg/m.  General:  Well nourished, well developed, in no acute distress HEENT: normal Neck: no JVD Vascular: No carotid bruits; Distal pulses 2+ bilaterally Cardiac:  normal S1, S2; RRR; no murmur  Lungs:  course breath sound  Abd: soft, nontender, no hepatomegaly  Ext: no edema Musculoskeletal:  No deformities, BUE and BLE strength normal and equal Skin: warm and dry  Neuro:  CNs 2-12 intact, no focal abnormalities noted Psych:  Normal affect   EKG:  The EKG was personally reviewed and demonstrates:  SR, ST changes in leads V1-V3, RBBB Telemetry:  Telemetry was personally reviewed and demonstrates:   SR, PVCS  Relevant CV Studies:  RIGHT/LEFT HEART CATH AND CORONARY ANGIOGRAPHY  07/16/22   Conclusion      Mid LM to Dist LM lesion is 85% stenosed.   Prox LAD lesion is 50% stenosed.   Ost RCA to Prox RCA lesion is 100% stenosed.   Ost 1st Diag lesion is 60% stenosed.   Ost 1st Mrg lesion is 95% stenosed.   Ost Cx to Prox Cx lesion is 100% stenosed.   Prox LAD to Mid LAD lesion is 100% stenosed.   SVG graft was visualized by angiography and is normal in caliber.   LIMA graft was visualized by non-selective angiography and is normal in caliber.   The graft exhibits no disease.   The graft exhibits no disease.   1.  Severe underlying three-vessel and left main coronary artery disease with patent grafts including LIMA to LAD, SVG to diagonal and SVG to OM.  The RCA is small and known to be chronically occluded with left to right collaterals. 2.  The bioprosthetic aortic valve was not crossed. 3.  Right heart catheterization showed normal right and left-sided filling pressures, severe pulmonary hypertension and mildly reduced cardiac output.   RA: 8 mmHg RV: 79/2 with an EDP of 14 mmHg PA: 79/23 with a mean of 44 mmHg PCW: 8 mmHg Cardiac output: 4.35 with an index of 2.37. Pulmonary vascular resistance: 8.2 Woods units.   Recommendations: The patient's coronary artery disease is stable and does not require revascularization given patent grafts. Continue medical therapy for chronic systolic heart failure.  He is euvolemic on small dose furosemide. The patient has severe pulmonary hypertension that does not seem to  be due to left-sided heart failure.  This could be due to intrinsic lung disease.  Will discuss with pulmonary and refer the patient to the advanced heart failure clinic.   Diagnostic Dominance: Right  Echo 06/08/22  1. Left ventricular ejection fraction, by estimation, is 35 to 40%. The  left ventricle has moderately decreased function. The left ventricle  demonstrates  global hypokinesis with severe apical hypokinesis and  spontaneous contrast in the apical region.D  shaped septum systole and diastole consistent with RV volume and pressure  overload.   2. There is mild left ventricular hypertrophy. Left ventricular diastolic  parameters are consistent with Grade II diastolic dysfunction  (pseudonormalization). The average left ventricular global longitudinal  strain is 6.2 %. The global longitudinal  strain is abnormal.   3. Right ventricular systolic function is severely reduced. The right  ventricular size is severely enlarged. There is severely elevated  pulmonary artery systolic pressure. The estimated right ventricular  systolic pressure is [id number] mmHg.   4. Left atrial size was mildly dilated.   5. Right atrial size was severely dilated.   6. The mitral valve is normal in structure. Mild to moderate mitral valve  regurgitation. No evidence of mitral stenosis.   7. Tricuspid valve regurgitation is mild to moderate.   8. The aortic valve was not well visualized. Aortic valve regurgitation  is not visualized. No aortic stenosis is present.   9. The inferior vena cava is dilated in size with >50% respiratory  variability, suggesting right atrial pressure of 8 mmHg.    Laboratory Data:  High Sensitivity Troponin:   Recent Labs  Lab 08/25/2022 1705 08/24/2022 1920 Aug 25, 2022 0148 08-25-2022 0539  TROPONINIHS 62* 174* 1,490* 1,937*     Chemistry Recent Labs  Lab 08/30/2022 1705 08-25-2022 0148  NA 141 138  K 3.7 4.3  CL 107 101  CO2 23 21*  GLUCOSE 151* 160*  BUN 19 22  CREATININE 1.16 1.40*  CALCIUM 9.3 9.2  GFRNONAA >60 51*  ANIONGAP 11 16*    Recent Labs  Lab 09/06/2022 1705  PROT 5.9*  ALBUMIN 3.3*  AST 22  ALT 18  ALKPHOS 64  BILITOT 1.3*   Lipids No results for input(s): "CHOL", "TRIG", "HDL", "LABVLDL", "LDLCALC", "CHOLHDL" in the last 168 hours.  Hematology Recent Labs  Lab 08/18/2022 1705 08/25/2022 0148  WBC 13.3* 19.5*  RBC  5.10 4.93  HGB 15.4 15.0  HCT 44.3 42.9  MCV 86.9 87.0  MCH 30.2 30.4  MCHC 34.8 35.0  RDW 14.5 14.6  PLT 180 204   Thyroid No results for input(s): "TSH", "FREET4" in the last 168 hours.  BNP Recent Labs  Lab 09/10/2022 1730  BNP 788.5*    DDimer No results for input(s): "DDIMER" in the last 168 hours.   Radiology/Studies:  CT Angio Chest PE W and/or Wo Contrast  Result Date: 08/18/2022 CLINICAL DATA:  Positive D-dimer, chest pain, shortness of breath EXAM: CT ANGIOGRAPHY CHEST WITH CONTRAST TECHNIQUE: Multidetector CT imaging of the chest was performed using the standard protocol during bolus administration of intravenous contrast. Multiplanar CT image reconstructions and MIPs were obtained to evaluate the vascular anatomy. RADIATION DOSE REDUCTION: This exam was performed according to the departmental dose-optimization program which includes automated exposure control, adjustment of the mA and/or kV according to patient size and/or use of iterative reconstruction technique. CONTRAST:  48mL OMNIPAQUE IOHEXOL 350 MG/ML SOLN COMPARISON:  06/21/2022 FINDINGS: Cardiovascular: There are no intraluminal filling defects in pulmonary artery branches. Contrast  density in the thoracic aorta is less than adequate to evaluate the lumen. Extensive coronary artery calcifications are seen. There is prosthetic aortic valve. Mediastinum/Nodes: No significant lymphadenopathy is seen. Lungs/Pleura: Centrilobular emphysema is seen. In image 30 of series 7, there is 5 mm nodule in right upper lobe with no significant interval change. New patchy alveolar infiltrate in left lower lobe suggesting pneumonia. There is no pleural effusion or pneumothorax. Upper Abdomen: There is reflux of contrast into hepatic veins suggesting tricuspid incompetence. There is 5.8 cm cyst in the upper pole of right kidney. There are a few low-density lesions in the upper pole of left kidney measuring up to 1.4 cm suggesting renal cysts. In  image 109 of series 5, there is 11 mm lesion with density measurements higher than usual for simple cyst, possibly hyperdense cyst. Similar finding was seen on the previous study. Musculoskeletal: No acute findings are seen. Metallic sutures are seen in the sternum. Review of the MIP images confirms the above findings. IMPRESSION: There is new patchy alveolar infiltrate in the left lower lobe suggesting pneumonia. There is no pleural effusion. There is no evidence of pulmonary artery embolism. Extensive coronary artery calcifications are seen. There is prosthetic aortic valve. There is possible 5 mm granuloma in right upper lobe which appears stable. Bilateral simple renal cysts requiring no follow-up imaging. There is a 1.1 cm high density structure in the anterior midportion of left kidney, possibly hyperdense cyst. Follow-up multiphasic CT should be considered to rule out any underlying solid lesion. Electronically Signed   By: Elmer Picker M.D.   On: 08/23/2022 19:41   DG Chest Portable 1 View  Result Date: 08/31/2022 CLINICAL DATA:  Chest pain shortness of breath EXAM: PORTABLE CHEST 1 VIEW COMPARISON:  06/21/2022 FINDINGS: Cardiomegaly status post median sternotomy with CABG and aortic valve prosthesis. Both lungs are clear. The visualized skeletal structures are unremarkable. IMPRESSION: Cardiomegaly without acute abnormality of the lungs in AP portable projection. Electronically Signed   By: Delanna Ahmadi M.D.   On: 09/12/2022 19:13     Assessment and Plan:   NSETMI - Troponin trended up. EKG with ST/T wave changes but appears similar to 12/2021. - Recent cath 07/16/22 with patents grafts LIMA to LAD, SVG to diagonal and SVG to OM. - Likely demand ischemia from acute hypoxic respiratory failure and HCP - Continue heparin for 24-48 hours. We can review films with internationalist tomorrow  - Continue ASA, statin and BB  2. HFrEF - Echo 05/2022 with stable LVEF at 35-40% and grade II DD -  RHC 06/2022:  Right heart catheterization showed normal right and left-sided filling pressures, severe pulmonary hypertension and mildly reduced cardiac output. - Continue Toprol XL - Continue Entresto - Continue Farxiga - Continue Lasix 20mg  qd  3. AKI - Renal function pumped to 1.4 from 1.16 - Follow closely  4. pHTN - Follow up with Dr. Aundra Dubin on 9/19  Risk Assessment/Risk Scores:   TIMI Risk Score for Unstable Angina or Non-ST Elevation MI:   The patient's TIMI risk score is 6, which indicates a 41% risk of all cause mortality, new or recurrent myocardial infarction or need for urgent revascularization in the next 14 days.{   For questions or updates, please contact [place] Please consult [email or web address] for contact info under    Jarrett Soho, PA  09-03-22 9:51 AM   Patient seen and examined with Robbie Lis PA.  Agree as above, with the following exceptions and changes as  noted below.  76 year old male with known coronary artery disease with recent cardiac catheterization demonstrating patent grafts and severe pulmonary hypertension who presents with pneumonia and acute on chronic respiratory failure, found to have a rising troponin with multifactorial chest discomfort.  Patient notes continued burning chest discomfort and paroxysms of difficulty breathing.. Gen: NAD, CV: RRR, no murmurs, Lungs: Increased work of breathing, shallow respirations, Abd: soft, Extrem: Warm, well perfused, no edema, Neuro/Psych: alert and oriented x 3, normal mood and affect. All available labs, radiology testing, previous records reviewed.  Echocardiogram from 06/08/2022 and cardiac catheterization 07/16/2022 both reviewed.  Patient has evidence on echo of right ventricular pressure overload with ventricular apical flattening, and is noted to have severe pulmonary hypertension with a mean PA pressure of 44 mmHg on recent right heart catheterization.  Pulmonary capillary wedge  pressure on cath was 8 mmHg, pulmonary hypertension not felt to be due to left-sided heart disease, but rather intrinsic lung disease.  Echocardiogram also showed swirling spontaneous echo contrast as well as swirling Definity contrast with apparent sludge in the LV apex on last echo.  Patient does have a history of GI bleeding, and is currently only on monotherapy with aspirin.  With rising troponin, we will consider review with interventional cardiology tomorrow for any opportunity for intervention or indications for cardiac catheterization.  It is possible that his troponin rise is due to acute respiratory distress and represents demand ischemia with known multivessel native CAD.  His respiratory status would not be amenable to cath today, will plan early invasive strategy if indicated, and will continue heparin IV for 48 hours for medical management of NSTEMI if no intervention is pursued.  Of note his blood pressure is low, and he has severe pulmonary hypertension.  Avoid hypotension, may need to hold Entresto if hypotension worsens.  We will repeat a limited echo to reassess the LV apex given appearance of sludge on last echo.  In the setting of chest pain and rising troponin, would want to exclude LV apical thrombus.  Elouise Munroe, MD 09/05/22 10:46 AM

## 2022-09-16 NOTE — Progress Notes (Signed)
At 1040, RN notified that patient was requesting something for anxiety. Upon entering room, patient stated that he was having an anxiety attack and need "a shot of something to help". RR was 33 and patient appeared distressed. RN paged attending and administered 0.5 mg  Lorazepam via IV push at 1045. Patient continued to state that he needed help. RN asked nurse tech to bring EKG machine to room. Around 1055, while RN and NT were obtaining EKG, patient was obtunded, non-rebreather applied, had pulse, and blood pressure. RN called for assistance, and charge RN along with other staff RNs came to bedside. Rapid response notified at 1057. Patient was a partial code: intubate/BiPAP only. RN spoke to attending via phone at 1101. Code called at 1105.

## 2022-09-16 NOTE — Progress Notes (Signed)
Responded to page to provide support to family at bedside. Several family member arrived and are now at bedside. Chaplain available as needed.    Venida Jarvis, [place], [facility], Pager [phone]

## 2022-09-16 NOTE — Progress Notes (Signed)
  Transition of Care (TOC) Screening Note   Patient Details  Name: Harold Parker Date of Birth: [birth year]   Transition of Care [facility]) CM/SW Contact:    Erin Sons, LCSW Phone Number: 09/07/2022, 12:37 PM    Transition of Care Department [facility]) has reviewed patient and no TOC needs have been identified at this time. We will continue to monitor patient advancement through interdisciplinary progression rounds. If new patient transition needs arise, please place a TOC consult.

## 2022-09-16 NOTE — Anesthesia Procedure Notes (Signed)
Procedure Name: Intubation Date/Time: 08-27-22 11:11 AM  Performed by: Maxine Glenn, CRNAPre-anesthesia Checklist: Patient identified, Emergency Drugs available, Suction available and Patient being monitored Patient Re-evaluated:Patient Re-evaluated prior to induction Oxygen Delivery Method: Ambu bag Preoxygenation: Pre-oxygenation with 100% oxygen Induction Type: Rapid sequence Laryngoscope Size: Glidescope and 4 Grade View: Grade I Tube type: Oral Tube size: 8.0 mm Number of attempts: 1 Airway Equipment and Method: Video-laryngoscopy and Rigid stylet Placement Confirmation: ETT inserted through vocal cords under direct vision, positive ETCO2, breath sounds checked- equal and bilateral and CO2 detector Secured at: 23 cm Tube secured with: Tape Dental Injury: Teeth and Oropharynx as per pre-operative assessment

## 2022-09-16 NOTE — Progress Notes (Signed)
I was called by rapid response to room 3E11 due to patient being obtunded and not breathing, when I walked in the room I started manually ventilating the patient with a BVM with 100% oxygen, CRNA eventually intubated the patient via ETT. Good color change on ETCO2 detector.

## 2022-09-16 NOTE — Progress Notes (Signed)
Overnight progress note  Patient admitted for acute on chronic hypoxic respiratory failure secondary to community-acquired pneumonia.  He does have history of CAD status post CABG x3 and underwent recent cardiac catheterization 7/31 which showed severe underlying three-vessel and left main CAD with patent grafts.  Troponin 62 >174 at the time of admission and felt to be due to demand ischemia.  Now trended up to 1490 on repeat labs.  Patient is not complaining of chest pain.  Started IV heparin and continue aspirin.  Repeat EKG and trend troponin.  Cardiology has been consulted.

## 2022-09-16 NOTE — Death Summary Note (Signed)
DEATH SUMMARY   Patient Details  Name: Harold Parker MRN: [id number] DOB: 04/19/1946 UVO:[id number], Harold Ha, MD Admission/Discharge Information   Admit Date:  09/09/2022  Date of Death: Date of Death: 2022-09-10  Time of Death: Time of Death: 02-14-1120  Length of Stay: 1   Principle Cause of death: Multifactorial respiratory arrest leading to PEA arrest in the setting of community-acquired pneumonia in the left lower lobe complicated by NSTEMI and chronic combined systolic and diastolic CHF  Hospital Diagnoses: Principal Problem:   Community acquired pneumonia of left lower lobe of lung Active Problems:   Acute on chronic respiratory failure with hypoxia (HCC)   CAD S/P CABG x 3   Chronic combined systolic and diastolic CHF (congestive heart failure) (HCC)   Stage 3 severe COPD by GOLD classification ([place])   Type 2 diabetes mellitus with hyperglycemia, without long-term current use of insulin ([place])   Hypertension associated with diabetes ([place])   Hyperlipidemia associated with type 2 diabetes mellitus ([place])   Anxiety   Lesion of left native kidney   Pressure injury of skin  Hospital Course: HPI per Dr. Zada Finders Harold Parker is a 76 y.o. male with medical history significant for CAD s/p CABG, chronic combined systolic and diastolic CHF (EF 74-25% 08/5637), AS s/p bioprosthetic AVR, COPD, chronic respiratory failure with hypoxia on 4 L O2 via [place], history of GI bleed due to duodenal ulcers, T2DM, HTN, HLD, anxiety who presented to the ED for evaluation of shortness of breath.   Patient states he has been having progressive shortness of breath over the last 2 days.  He has had nonspecific intermittent chest discomfort as well.  He says he does have chronic issues with occasional anginal type symptoms related to his known severe CAD.  He has not had any cough.  He reports chills and diaphoresis but no subjective fevers.  He denies any nausea, vomiting, diarrhea, peripheral edema.  He reports  good urine output without dysuria.  He is feeling anxious.   ED Course  Labs/Imaging on admission: I have personally reviewed following labs and imaging studies.   Initial vitals showed BP 129/62, pulse 127, RR 38, temp 99.7 F, SPO2 92% on 10 L NRB.   Labs show WBC 13.3, hemoglobin 15.4, platelets 180,000, sodium 141, potassium 3.7, bicarb 23, BUN 19, creatinine 1.16, serum glucose 151, troponin 62 > 174, BNP 788.5, lactic acid 1.6.   Portable chest x-ray negative for focal consolidation, edema, effusion.  Post CABG and aortic valve prosthesis changes noted.   CTA chest PE shows new patchy alveolar infiltrate in the left lower lobe without pleural effusion.  No evidence of pulmonary artery embolism.  Extensive coronary artery calcifications noted.  Possible 5 mm granuloma in right upper lobe appears stable.  1.1 cm high density structure in the anterior midportion of the left kidney seen, possibly hyperdense cyst.   Patient was given IV ceftriaxone and azithromycin.  The hospitalist service was consulted to admit for further evaluation and management.  Interim History -He was admitted for acute on chronic hypoxic respiratory failure in the setting of [facility] pneumonia as well as a history of chronic combined CHF and he did have a history of CAD status post CABG and underwent recent cardiac catheterization which showed severe underlying three-vessel disease and left main CAD with patent grafts.  Troponin value went from 62 and trended up to 174 at the time of admission and was initially felt to be due to demand ischemia but then trended  up to 1490 and then further worsened to 1937.  He was not complaining of any chest pain but was very anxious and was initially on heparin drip as well as aspirin.  Cardiology was consulted for his NSTEMI and they considered review with interventional cardiology tomorrow for an opportunity for intervention or indication for cardiac cath.  They had a feeling of the  troponin rise was due to acute respiratory distress and represented demand ischemia with known multivessel native CAD.  Given that his respiratory status is not amenable to cardiac catheter was not done today and they wanted to continue heparin drip for 48 hours of medical management of NSTEMI if no intervention was pursued.  As blood pressure is on the lower side and he has severe pulm hypertension and cardiology recommended avoiding hypotension and holding Entresto if his hypotension worsened and they recommended repeating a limited echo to reassess his LV apex given the sludge on the last echo.    Patient had a panic attack and received 0.5 mg of Ativan and then shortly thereafter became unresponsive and was placed on a nonrebreather.  Given his sudden change there is concern for acute PE despite the CTA last night has not mentioning anything on the scan.  The rapid nurse was called and he was unresponsive to painful stimuli and he was diaphoretic and had poor color.    I was paged urgently to the bedside and arrived and he was breathing but had a heart rate in the 50s and he was given Romazicon without change and started getting ventilation via Ambu bag until anesthesia intubated.  He was given atropine with no response.  Subsequently a CODE BLUE was called for respiratory arrest and patient was intubated for airway protection.  Given concern for the possible PE given his sudden and acute change in status, TNK was ordered however never given given that he expired prior to this administration.    His heart rate remained on the low side and then upon further chart review patient had a limited code where he was okay with intubation but did not want any ACLS or chest compressions.  Patient's documented wishes were upheld and patient was ventilated however given that his heart rate continued to drop I called cardiology urgently to the bedside as well and they came to evaluate and because of the patient is  partial CODE STATUS and wishes no further intervention was done and ACLS and chest compressions were not initiated.  He continued to then become bradycardic and was able to maintain his pulse for a few minutes and then lost his pulse and went to to PEA arrest.  Family was updated and time of death was 1121 on Sep 12, 2022.  I met the family at bedside after they arrived and offered my condolences.  Assessment and Plan: * Community acquired pneumonia of left lower lobe of lung -CT consistent with left lower lobe pneumonia but did not mention any PE last night.  WBC 13.3 and worsened to 19.5. -Was going to continue IV ceftriaxone and azithromycin -Calcitonin level was 3.77 -Follow strep pneumonia urinary antigen and this was negative -SARS-CoV-2 testing was negative -Unfortunately patient expired as described as above  Acute on chronic respiratory failure with hypoxia (HCC) -Wears 4 L O2 via [place] at all times at baseline.  Requiring NRB on admission. -Wean supplemental O2 as able -Use BiPAP if needed however his respiratory status changed quite quickly and he ended up having a respiratory arrest  Chronic combined systolic  and diastolic CHF (congestive heart failure) (HCC) -BNP chronically elevated however appears euvolemic on admission.  No evidence of pulmonary edema on imaging. -Continue Lasix 20 mg daily -Continue Entresto 24-26 mg twice daily -Continue Toprol-XL 50 mg daily -Continue Farxiga -Strict I/O's and daily weights -Cardiology was consulted  CAD S/P CABG x 3 with now NSTEMI -Denies any chest pain on admission.  Troponin trending up 62 > 174 and trended all the way up to 1937.  EKG shows sinus tachycardia without acute ischemic changes.  Underwent recent right/left heart cath 07/16/2022 which showed severe underlying three-vessel and left main CAD with patent grafts.  Suspect type II demand ischemia in setting of acute on chronic hypoxic respiratory failure. -Was wanted to continue to  trend troponin -Given the worsening IV heparin was initiated -Continue aspirin, rosuvastatin, Zetia -Cardiology initially felt that he had demand and ischemia  Stage 3 severe COPD by GOLD classification ([place]) -No wheezing on admission. -Wean to home flow O2 via [place] as able -Continue Pulmicort, formoterol, Yupelri, albuterol as needed while he is hospitalized but his respiratory status decompensated and he became unresponsive and required intubation until he passed away  Type 2 diabetes mellitus with hyperglycemia, without long-term current use of insulin ([place]) -Was going to continue Farxiga, hold glipizide and metformin.  Placed on SSI.  Hypertension associated with diabetes ([place]) -Continue Entresto, Toprol-XL, Lasix. -Blood pressure prior to becoming unresponsive was 110/80  Hyperlipidemia associated with type 2 diabetes mellitus ([place]) -Continued Zetia and rosuvastatin.  Lesion of left native kidney -1.1 cm high density structure in the anterior midportion of left kidney seen on CT.  Possibly hyperdense cyst.  Follow-up multiphasic CT recommended to rule out any underlying solid lesion.  Anxiety -Continue home Xanax 0.5 mg twice daily and BuSpar. -Given a slight dose of 0.5 mg of IV lorazepam however became more unresponsive after this and was given the flumazenil antidote with no effect and patient's respiratory status continued to worsen requiring intubation as mentioned above  Active Pressure Injury/Wound(s)     Pressure Ulcer  Duration          Pressure Injury 09/10/2022 Buttocks Medial Stage 1 -  Intact skin with non-blanchable redness of a localized area usually over a bony prominence. <1 day           Procedures: CTA of the chest PE protocol  Consultations: Cardiology  PHYSICAL EXAM at the Time of Death:  Eyes: Fixed and Dilated with no response to Threat Heart: No audible heartbeat after listening for over a minute Lungs: No spontaneous breathing or upward rise of  the chest Extremities: No palpated pulses  The results of significant diagnostics from this hospitalization (including imaging, microbiology, ancillary and laboratory) are listed below for reference.   Significant Diagnostic Studies: CT Angio Chest PE W and/or Wo Contrast  Result Date: 09/13/2022 CLINICAL DATA:  Positive D-dimer, chest pain, shortness of breath EXAM: CT ANGIOGRAPHY CHEST WITH CONTRAST TECHNIQUE: Multidetector CT imaging of the chest was performed using the standard protocol during bolus administration of intravenous contrast. Multiplanar CT image reconstructions and MIPs were obtained to evaluate the vascular anatomy. RADIATION DOSE REDUCTION: This exam was performed according to the departmental dose-optimization program which includes automated exposure control, adjustment of the mA and/or kV according to patient size and/or use of iterative reconstruction technique. CONTRAST:  71m OMNIPAQUE IOHEXOL 350 MG/ML SOLN COMPARISON:  06/21/2022 FINDINGS: Cardiovascular: There are no intraluminal filling defects in pulmonary artery branches. Contrast density in the thoracic aorta is  less than adequate to evaluate the lumen. Extensive coronary artery calcifications are seen. There is prosthetic aortic valve. Mediastinum/Nodes: No significant lymphadenopathy is seen. Lungs/Pleura: Centrilobular emphysema is seen. In image 30 of series 7, there is 5 mm nodule in right upper lobe with no significant interval change. New patchy alveolar infiltrate in left lower lobe suggesting pneumonia. There is no pleural effusion or pneumothorax. Upper Abdomen: There is reflux of contrast into hepatic veins suggesting tricuspid incompetence. There is 5.8 cm cyst in the upper pole of right kidney. There are a few low-density lesions in the upper pole of left kidney measuring up to 1.4 cm suggesting renal cysts. In image 109 of series 5, there is 11 mm lesion with density measurements higher than usual for simple  cyst, possibly hyperdense cyst. Similar finding was seen on the previous study. Musculoskeletal: No acute findings are seen. Metallic sutures are seen in the sternum. Review of the MIP images confirms the above findings. IMPRESSION: There is new patchy alveolar infiltrate in the left lower lobe suggesting pneumonia. There is no pleural effusion. There is no evidence of pulmonary artery embolism. Extensive coronary artery calcifications are seen. There is prosthetic aortic valve. There is possible 5 mm granuloma in right upper lobe which appears stable. Bilateral simple renal cysts requiring no follow-up imaging. There is a 1.1 cm high density structure in the anterior midportion of left kidney, possibly hyperdense cyst. Follow-up multiphasic CT should be considered to rule out any underlying solid lesion. Electronically Signed   By: Elmer Picker M.D.   On: 08/17/2022 19:41   DG Chest Portable 1 View  Result Date: 08/27/2022 CLINICAL DATA:  Chest pain shortness of breath EXAM: PORTABLE CHEST 1 VIEW COMPARISON:  06/21/2022 FINDINGS: Cardiomegaly status post median sternotomy with CABG and aortic valve prosthesis. Both lungs are clear. The visualized skeletal structures are unremarkable. IMPRESSION: Cardiomegaly without acute abnormality of the lungs in AP portable projection. Electronically Signed   By: Delanna Ahmadi M.D.   On: 08/18/2022 19:13    Microbiology: Recent Results (from the past 240 hour(s))  SARS Coronavirus 2 by RT PCR (hospital order, performed in [facility] hospital lab) *cepheid single result test* Anterior Nasal Swab     Status: None   Collection Time: 08/22/2022  8:32 PM   Specimen: Anterior Nasal Swab  Result Value Ref Range Status   SARS Coronavirus 2 by RT PCR NEGATIVE NEGATIVE Final    Comment: (NOTE) SARS-CoV-2 target nucleic acids are NOT DETECTED.  The SARS-CoV-2 RNA is generally detectable in upper and lower respiratory specimens during the acute phase of infection. The  lowest concentration of SARS-CoV-2 viral copies this assay can detect is 250 copies / mL. A negative result does not preclude SARS-CoV-2 infection and should not be used as the sole basis for treatment or other patient management decisions.  A negative result may occur with improper specimen collection / handling, submission of specimen other than nasopharyngeal swab, presence of viral mutation(s) within the areas targeted by this assay, and inadequate number of viral copies (<250 copies / mL). A negative result must be combined with clinical observations, patient history, and epidemiological information.  Fact Sheet for Patients:   https://www.patel.info/  Fact Sheet for Healthcare Providers: https://hall.com/  This test is not yet approved or  cleared by the [place] FDA and has been authorized for detection and/or diagnosis of SARS-CoV-2 by FDA under an Emergency Use Authorization (EUA).  This EUA will remain in effect (meaning this test can  be used) for the duration of the COVID-19 declaration under Section 564(b)(1) of the Act, 21 U.S.C. section 360bbb-3(b)(1), unless the authorization is terminated or revoked sooner.  Performed at [facility], [place] [address], [place], [place] [address]     Time spent: 50 minutes  Signed: Raiford Noble, DO 29-Aug-2022

## 2022-09-16 NOTE — Significant Event (Signed)
Rapid Response Event Note   Reason for Call :  Per Staff patient had panic attack and received 0.5mg  Ativan.  He then suddenly became unresponsive. They placed him on a NRB  Initial Focused Assessment:  Upon my arrival he is unresponsive to painful stimuli.  He is diaphoretic and has poor color.  He is breathing but his HR is in the 50s.  (His HR is usually 80-90s).  Code Blue called for intubation. His code status states: yes to intubation, no to CPR  Interventions:  Ventilation via Ambu bag until anesthesia intubated Romazicon given with no change Atropine given with no response. Dr Marland Mcalpine spoke with significant other, confirmed code status. He became bradycardic in the 20s and maintained a pulse for a few minutes.  Then he lost his pulse. Time of death 1120-04-16, no heart tones Dr Marland Mcalpine and Dr Jacques Navy at bedside   Significant other came to bedside shortly after patient expired  Plan of Care:     Event Summary:   MD Notified:  Call Time: Arrival Time: End Time:  Marcellina Millin, RN

## 2022-09-16 DEATH — deceased

## 2022-09-17 ENCOUNTER — Ambulatory Visit: Payer: Medicare Other | Admitting: Pulmonary Disease

## 2022-09-28 ENCOUNTER — Ambulatory Visit: Payer: Medicare Other | Admitting: Physician Assistant

## 2022-10-24 MED FILL — Medication: Qty: 1 | Status: AC

## 2023-01-11 ENCOUNTER — Encounter: Payer: Self-pay | Admitting: Pulmonary Disease

## 2023-03-30 ENCOUNTER — Encounter: Payer: Self-pay | Admitting: Pulmonary Disease
